# Patient Record
Sex: Female | Born: 1950 | Race: White | Hispanic: No | Smoking: Former smoker
Health system: Southern US, Community
[De-identification: ages and names within clinical notes are randomized; demographics above are authoritative.]

## PROBLEM LIST (undated history)

## (undated) ENCOUNTER — Emergency Department (HOSPITAL_BASED_OUTPATIENT_CLINIC_OR_DEPARTMENT_OTHER): Admission: EM | Payer: Commercial Managed Care - HMO

## (undated) DIAGNOSIS — F319 Bipolar disorder, unspecified: Secondary | ICD-10-CM

## (undated) DIAGNOSIS — S92912A Unspecified fracture of left toe(s), initial encounter for closed fracture: Secondary | ICD-10-CM

## (undated) DIAGNOSIS — Z9109 Other allergy status, other than to drugs and biological substances: Secondary | ICD-10-CM

## (undated) DIAGNOSIS — N200 Calculus of kidney: Secondary | ICD-10-CM

## (undated) DIAGNOSIS — F32A Depression, unspecified: Secondary | ICD-10-CM

## (undated) DIAGNOSIS — F419 Anxiety disorder, unspecified: Secondary | ICD-10-CM

## (undated) DIAGNOSIS — B029 Zoster without complications: Secondary | ICD-10-CM

## (undated) DIAGNOSIS — M6289 Other specified disorders of muscle: Secondary | ICD-10-CM

## (undated) DIAGNOSIS — M79672 Pain in left foot: Secondary | ICD-10-CM

## (undated) DIAGNOSIS — M26609 Unspecified temporomandibular joint disorder, unspecified side: Secondary | ICD-10-CM

## (undated) DIAGNOSIS — R3912 Poor urinary stream: Secondary | ICD-10-CM

## (undated) DIAGNOSIS — Z86010 Personal history of colonic polyps: Secondary | ICD-10-CM

## (undated) DIAGNOSIS — E559 Vitamin D deficiency, unspecified: Secondary | ICD-10-CM

## (undated) DIAGNOSIS — I10 Essential (primary) hypertension: Secondary | ICD-10-CM

## (undated) DIAGNOSIS — I255 Ischemic cardiomyopathy: Secondary | ICD-10-CM

## (undated) DIAGNOSIS — Z85828 Personal history of other malignant neoplasm of skin: Secondary | ICD-10-CM

## (undated) DIAGNOSIS — K449 Diaphragmatic hernia without obstruction or gangrene: Secondary | ICD-10-CM

## (undated) DIAGNOSIS — I201 Angina pectoris with documented spasm: Secondary | ICD-10-CM

## (undated) DIAGNOSIS — N719 Inflammatory disease of uterus, unspecified: Secondary | ICD-10-CM

## (undated) DIAGNOSIS — K8020 Calculus of gallbladder without cholecystitis without obstruction: Secondary | ICD-10-CM

## (undated) DIAGNOSIS — D649 Anemia, unspecified: Secondary | ICD-10-CM

## (undated) DIAGNOSIS — Z860101 Personal history of adenomatous and serrated colon polyps: Secondary | ICD-10-CM

## (undated) DIAGNOSIS — C801 Malignant (primary) neoplasm, unspecified: Secondary | ICD-10-CM

## (undated) DIAGNOSIS — R7303 Prediabetes: Secondary | ICD-10-CM

## (undated) DIAGNOSIS — S139XXA Sprain of joints and ligaments of unspecified parts of neck, initial encounter: Secondary | ICD-10-CM

## (undated) DIAGNOSIS — J449 Chronic obstructive pulmonary disease, unspecified: Secondary | ICD-10-CM

## (undated) DIAGNOSIS — I214 Non-ST elevation (NSTEMI) myocardial infarction: Secondary | ICD-10-CM

## (undated) DIAGNOSIS — M47812 Spondylosis without myelopathy or radiculopathy, cervical region: Secondary | ICD-10-CM

## (undated) DIAGNOSIS — J189 Pneumonia, unspecified organism: Secondary | ICD-10-CM

## (undated) DIAGNOSIS — M797 Fibromyalgia: Secondary | ICD-10-CM

## (undated) DIAGNOSIS — Z87442 Personal history of urinary calculi: Secondary | ICD-10-CM

## (undated) DIAGNOSIS — I639 Cerebral infarction, unspecified: Secondary | ICD-10-CM

## (undated) DIAGNOSIS — N183 Chronic kidney disease, stage 3 unspecified: Secondary | ICD-10-CM

## (undated) DIAGNOSIS — E871 Hypo-osmolality and hyponatremia: Secondary | ICD-10-CM

## (undated) DIAGNOSIS — A419 Sepsis, unspecified organism: Secondary | ICD-10-CM

## (undated) DIAGNOSIS — N950 Postmenopausal bleeding: Secondary | ICD-10-CM

## (undated) DIAGNOSIS — G3184 Mild cognitive impairment, so stated: Secondary | ICD-10-CM

## (undated) DIAGNOSIS — Z9889 Other specified postprocedural states: Secondary | ICD-10-CM

## (undated) DIAGNOSIS — R0602 Shortness of breath: Secondary | ICD-10-CM

## (undated) DIAGNOSIS — Z8619 Personal history of other infectious and parasitic diseases: Secondary | ICD-10-CM

## (undated) DIAGNOSIS — S72009A Fracture of unspecified part of neck of unspecified femur, initial encounter for closed fracture: Secondary | ICD-10-CM

## (undated) DIAGNOSIS — F1021 Alcohol dependence, in remission: Secondary | ICD-10-CM

## (undated) DIAGNOSIS — M79606 Pain in leg, unspecified: Secondary | ICD-10-CM

## (undated) DIAGNOSIS — K5909 Other constipation: Secondary | ICD-10-CM

## (undated) DIAGNOSIS — D126 Benign neoplasm of colon, unspecified: Secondary | ICD-10-CM

## (undated) DIAGNOSIS — R413 Other amnesia: Secondary | ICD-10-CM

## (undated) DIAGNOSIS — K219 Gastro-esophageal reflux disease without esophagitis: Secondary | ICD-10-CM

## (undated) DIAGNOSIS — M810 Age-related osteoporosis without current pathological fracture: Secondary | ICD-10-CM

## (undated) DIAGNOSIS — K648 Other hemorrhoids: Secondary | ICD-10-CM

## (undated) DIAGNOSIS — E785 Hyperlipidemia, unspecified: Secondary | ICD-10-CM

## (undated) DIAGNOSIS — M19032 Primary osteoarthritis, left wrist: Secondary | ICD-10-CM

## (undated) DIAGNOSIS — Z8673 Personal history of transient ischemic attack (TIA), and cerebral infarction without residual deficits: Secondary | ICD-10-CM

## (undated) HISTORY — DX: Other hemorrhoids: K64.8

## (undated) HISTORY — DX: Other specified disorders of muscle: M62.89

## (undated) HISTORY — DX: Unspecified fracture of left toe(s), initial encounter for closed fracture: S92.912A

## (undated) HISTORY — DX: Diaphragmatic hernia without obstruction or gangrene: K44.9

## (undated) HISTORY — PX: BACK SURGERY: SHX140

## (undated) HISTORY — DX: Angina pectoris with documented spasm: I20.1

## (undated) HISTORY — DX: Personal history of other infectious and parasitic diseases: Z86.19

## (undated) HISTORY — PX: COLONOSCOPY W/ POLYPECTOMY: SHX1380

## (undated) HISTORY — DX: Hypo-osmolality and hyponatremia: E87.1

## (undated) HISTORY — DX: Age-related osteoporosis without current pathological fracture: M81.0

## (undated) HISTORY — DX: Zoster without complications: B02.9

## (undated) HISTORY — DX: Primary osteoarthritis, left wrist: M19.032

## (undated) HISTORY — DX: Vitamin D deficiency, unspecified: E55.9

## (undated) HISTORY — PX: ESOPHAGOGASTRODUODENOSCOPY: SHX1529

## (undated) HISTORY — DX: Pain in left foot: M79.672

## (undated) HISTORY — DX: Chronic kidney disease, stage 3 unspecified: N18.30

## (undated) HISTORY — DX: Sprain of joints and ligaments of unspecified parts of neck, initial encounter: S13.9XXA

## (undated) HISTORY — DX: Pain in leg, unspecified: M79.606

## (undated) HISTORY — DX: Fracture of unspecified part of neck of unspecified femur, initial encounter for closed fracture: S72.009A

## (undated) HISTORY — DX: Chronic obstructive pulmonary disease, unspecified: J44.9

## (undated) HISTORY — DX: Spondylosis without myelopathy or radiculopathy, cervical region: M47.812

## (undated) HISTORY — DX: Hyperlipidemia, unspecified: E78.5

## (undated) HISTORY — PX: TRANSTHORACIC ECHOCARDIOGRAM: SHX275

## (undated) HISTORY — DX: Mild cognitive impairment of uncertain or unknown etiology: G31.84

## (undated) HISTORY — DX: Inflammatory disease of uterus, unspecified: N71.9

## (undated) HISTORY — DX: Prediabetes: R73.03

## (undated) HISTORY — DX: Calculus of gallbladder without cholecystitis without obstruction: K80.20

## (undated) HISTORY — DX: Ischemic cardiomyopathy: I25.5

## (undated) HISTORY — DX: Sepsis, unspecified organism: A41.9

## (undated) HISTORY — PX: JOINT REPLACEMENT: SHX530

## (undated) HISTORY — DX: Calculus of kidney: N20.0

## (undated) HISTORY — DX: Postmenopausal bleeding: N95.0

## (undated) HISTORY — DX: Non-ST elevation (NSTEMI) myocardial infarction: I21.4

## (undated) HISTORY — DX: Other amnesia: R41.3

## (undated) HISTORY — DX: Benign neoplasm of colon, unspecified: D12.6

## (undated) HISTORY — DX: Other constipation: K59.09

## (undated) HISTORY — DX: Poor urinary stream: R39.12

## (undated) HISTORY — DX: Fibromyalgia: M79.7

## (undated) HISTORY — DX: Alcohol dependence, in remission: F10.21

## (undated) HISTORY — PX: BUNIONECTOMY: SHX129

---

## 1973-04-04 HISTORY — PX: TONSILLECTOMY AND ADENOIDECTOMY: SUR1326

## 1999-07-23 ENCOUNTER — Ambulatory Visit (HOSPITAL_COMMUNITY): Admission: RE | Admit: 1999-07-23 | Discharge: 1999-07-23 | Payer: Self-pay | Admitting: Neurosurgery

## 1999-07-23 ENCOUNTER — Encounter: Payer: Self-pay | Admitting: Neurosurgery

## 1999-08-27 ENCOUNTER — Encounter: Payer: Self-pay | Admitting: Neurosurgery

## 1999-08-27 ENCOUNTER — Ambulatory Visit (HOSPITAL_COMMUNITY): Admission: RE | Admit: 1999-08-27 | Discharge: 1999-08-28 | Payer: Self-pay | Admitting: Neurosurgery

## 1999-08-27 HISTORY — PX: CERVICAL SPINE SURGERY: SHX589

## 2000-04-04 HISTORY — PX: CERVICAL FUSION: SHX112

## 2001-04-04 HISTORY — PX: TOTAL KNEE ARTHROPLASTY: SHX125

## 2002-08-06 ENCOUNTER — Encounter: Admission: RE | Admit: 2002-08-06 | Discharge: 2002-08-06 | Payer: Self-pay

## 2002-08-26 ENCOUNTER — Ambulatory Visit: Admission: RE | Admit: 2002-08-26 | Discharge: 2002-08-26 | Payer: Self-pay

## 2002-09-18 ENCOUNTER — Encounter: Admission: RE | Admit: 2002-09-18 | Discharge: 2002-09-18 | Payer: Self-pay

## 2002-12-27 ENCOUNTER — Emergency Department (HOSPITAL_COMMUNITY): Admission: EM | Admit: 2002-12-27 | Discharge: 2002-12-27 | Payer: Self-pay | Admitting: *Deleted

## 2003-10-21 ENCOUNTER — Emergency Department (HOSPITAL_COMMUNITY): Admission: EM | Admit: 2003-10-21 | Discharge: 2003-10-21 | Payer: Self-pay | Admitting: Emergency Medicine

## 2004-01-29 ENCOUNTER — Ambulatory Visit (HOSPITAL_COMMUNITY): Admission: RE | Admit: 2004-01-29 | Discharge: 2004-01-29 | Payer: Self-pay | Admitting: Gastroenterology

## 2005-03-16 ENCOUNTER — Other Ambulatory Visit: Admission: RE | Admit: 2005-03-16 | Discharge: 2005-03-16 | Payer: Self-pay | Admitting: Family Medicine

## 2005-03-31 ENCOUNTER — Encounter: Admission: RE | Admit: 2005-03-31 | Discharge: 2005-03-31 | Payer: Self-pay | Admitting: Family Medicine

## 2006-03-01 ENCOUNTER — Encounter: Admission: RE | Admit: 2006-03-01 | Discharge: 2006-03-01 | Payer: Self-pay | Admitting: Family Medicine

## 2007-03-26 ENCOUNTER — Ambulatory Visit (HOSPITAL_BASED_OUTPATIENT_CLINIC_OR_DEPARTMENT_OTHER): Admission: RE | Admit: 2007-03-26 | Discharge: 2007-03-26 | Payer: Self-pay | Admitting: Family Medicine

## 2007-04-01 ENCOUNTER — Ambulatory Visit: Payer: Self-pay | Admitting: Internal Medicine

## 2007-04-01 HISTORY — PX: OTHER SURGICAL HISTORY: SHX169

## 2008-02-27 ENCOUNTER — Encounter: Admission: RE | Admit: 2008-02-27 | Discharge: 2008-02-27 | Payer: Self-pay | Admitting: Family Medicine

## 2008-03-04 ENCOUNTER — Emergency Department (HOSPITAL_COMMUNITY): Admission: EM | Admit: 2008-03-04 | Discharge: 2008-03-04 | Payer: Self-pay | Admitting: Emergency Medicine

## 2008-04-04 DIAGNOSIS — Z8619 Personal history of other infectious and parasitic diseases: Secondary | ICD-10-CM

## 2008-04-04 HISTORY — DX: Personal history of other infectious and parasitic diseases: Z86.19

## 2008-04-18 ENCOUNTER — Encounter: Admission: RE | Admit: 2008-04-18 | Discharge: 2008-04-18 | Payer: Self-pay | Admitting: Gastroenterology

## 2008-04-24 ENCOUNTER — Other Ambulatory Visit: Admission: RE | Admit: 2008-04-24 | Discharge: 2008-04-24 | Payer: Self-pay | Admitting: Gastroenterology

## 2008-05-12 ENCOUNTER — Encounter: Admission: RE | Admit: 2008-05-12 | Discharge: 2008-05-12 | Payer: Self-pay | Admitting: Family Medicine

## 2008-07-10 ENCOUNTER — Encounter: Admission: RE | Admit: 2008-07-10 | Discharge: 2008-07-10 | Payer: Self-pay | Admitting: Neurosurgery

## 2009-03-18 ENCOUNTER — Encounter: Admission: RE | Admit: 2009-03-18 | Discharge: 2009-03-18 | Payer: Self-pay | Admitting: Neurology

## 2009-06-30 ENCOUNTER — Ambulatory Visit (HOSPITAL_COMMUNITY): Admission: RE | Admit: 2009-06-30 | Discharge: 2009-07-01 | Payer: Self-pay | Admitting: Neurosurgery

## 2009-06-30 HISTORY — PX: ANTERIOR CERVICAL DECOMP/DISCECTOMY FUSION: SHX1161

## 2010-03-25 ENCOUNTER — Other Ambulatory Visit
Admission: RE | Admit: 2010-03-25 | Discharge: 2010-03-25 | Payer: Self-pay | Source: Home / Self Care | Admitting: Family Medicine

## 2010-05-05 DIAGNOSIS — M810 Age-related osteoporosis without current pathological fracture: Secondary | ICD-10-CM

## 2010-05-05 HISTORY — DX: Age-related osteoporosis without current pathological fracture: M81.0

## 2010-06-25 LAB — CBC
HCT: 37.1 % (ref 36.0–46.0)
Platelets: 256 10*3/uL (ref 150–400)
RDW: 12.6 % (ref 11.5–15.5)

## 2010-06-25 LAB — PROTIME-INR: INR: 1 (ref 0.00–1.49)

## 2010-06-25 LAB — BASIC METABOLIC PANEL
BUN: 7 mg/dL (ref 6–23)
Calcium: 9.5 mg/dL (ref 8.4–10.5)
GFR calc non Af Amer: >60 mL/min (ref >60)
Glucose, Bld: 90 mg/dL (ref 70–99)

## 2010-06-25 LAB — APTT: aPTT: 32 seconds (ref 24–37)

## 2010-08-17 NOTE — Procedures (Signed)
NAME:  Erin Lopez, Erin Lopez            ACCOUNT NO.:  [id number]   MEDICAL RECORD NO.:  [id number]          PATIENT TYPE:  OUT   LOCATION:  SLEEP CENTER                 FACILITY:  [facility]   PHYSICIAN:  Clinton D. Maple Hudson, MD, FCCP, FACPDATE OF BIRTH:  May 07[birth year] 1952   DATE OF STUDY:  03/26/2007                            NOCTURNAL POLYSOMNOGRAM   REFERRING PHYSICIAN:  Talmadge Coventry, M.D.   INDICATION FOR STUDY:  Insomnia with sleep apnea.  BMI 25, weight 160  pounds, height 66 inches, neck 13.5 inches.   EPWORTH SLEEPINESS SCORE:  11/24   MEDICATIONS:  Home medication charted and reviewed.   SLEEP ARCHITECTURE:  Total sleep time 329 minutes with sleep efficiency  82%.  Stage I was 12%, stage II 64%, stage III absent, REM 23% of total  sleep time.  Sleep latency 19 minutes, REM latency 171 minutes.  Awake  after sleep onset 47 minutes.  Arousal index is 1.3.  Bedtime medication  included Lexapro, Ambien, and Flexeril.   RESPIRATORY DATA:  Apnea/hypopnea index (AHI) 0.2 obstructive events per  hour which is normal (normal range 0-5 per hour).  This reflected a  single obstructive respiratory event, a hypopnea recorded while sleeping  supine.   OXYGEN DATA:  Mild snoring with oxygen desaturation to a nadir of 89%.  Mean oxygen saturation through the study was 91.6% on room air.   CARDIAC DATA:  Normal sinus rhythm.   MOVEMENT-PARASOMNIA:  No significant movement disturbance.  Bathroom x1.   IMPRESSIONS-RECOMMENDATIONS:  1. Unremarkable sleep architecture for sleep center environment,      recognizing use of sedating medications, Ambien and Flexeril, at      bedtime.  2. No significant respiratory disturbance, apnea/hypopnea index 0.2      per hour (normal range 0-5 per hour).  Mild snoring with oxygen      desaturation to a nadir of 89%.      Clinton D. Maple Hudson, MD, [facility], FACP  Diplomate, Biomedical engineer of Sleep Medicine  Electronically Signed     CDY/MEDQ  D:  04/01/2007  11:56:24  T:  04/01/2007 16:10:96  Job:  [id number]

## 2010-08-20 NOTE — Op Note (Signed)
[place]  Patient:    Erin Lopez, Erin Lopez                   MRN: [id number] Proc. Date: 08/27/99 Adm. Date:  [id number] Disc. Date: [id number] Attending:  Gerald Dexter                           Operative Report  PREOPERATIVE DIAGNOSIS:  Spondylosis, C6-7, C7-T1, left.  POSTOPERATIVE DIAGNOSIS:  PROCEDURE:  Left C6-7, C7-T1 foraminotomies with left C7 hemilaminectomy. Microdissection of C7 and T1 nerve roots.  SURGEON:  Reinaldo Meeker, M.D.  ASSISTANT:  Julio Sicks, M.D.  PROCEDURE IN DETAIL:  After having been placed in three-point pin fixation and in the prone position, patients neck was prepped and draped in the usual sterile fashion.  Localizing x-ray was taken prior to incision to identify the appropriate level.  A midline incision was made above the spinous processes of C6, C7 and T1.  Using Bovie cutting current, the incision was carried down to the spinous processes.  Subperiosteal dissection was then carried out on the left side of the spinous processes and laminae and a McCullough self-retaining retractor was placed for exposure.  A second x-ray was taken to confirm approach at the appropriate level and this was correct.  A high-speed drill was used to do a proximal foraminotomy and laminotomies laterally to allow for access to the ______  of the lamina of C7.  A complete left-sided hemilaminectomy of C7 was then carried out.  Foraminotomies were then carried out at C6-7 and C7-T1 on the left side.  Microscope was draped, brought into the field and used for the remainder of the case.  Using microdissection technique, the proximal C7 nerve root was identified.  Inspection was then carried out down to the C7-T1 level where the C8 nerve root was identified. Generous foraminotomy was carried out.  Along the lateral aspect of the spinal canal behind the lamina of C7, there was overgrowth of soft tissue and this was removed.  This  gave excellent decompression of the lateral spinal dura as well as the proximal C8 nerve root.  At this point, inspection was carried out in all directions for any evidence of residual compression and none could be identified.  Large amounts of irrigation were carried out and any bleeding controlled with bipolar coagulation and Gelfoam.  The wound was then closed using interrupted Vicryl in the muscle, fascia, subcutaneous and subcuticular tissues and staples on the skin.  A sterile dressing was then applied. Patient was extubated and taken to recovery room in stable condition.DD: 08/27/99 TD:  08/31/99 Job: [id number]

## 2010-08-20 NOTE — Op Note (Signed)
Erin Lopez, Erin Lopez            ACCOUNT NO.:  [id number]   MEDICAL RECORD NO.:  [id number]          PATIENT TYPE:  AMB   LOCATION:  ENDO                         FACILITY:  [facility]   PHYSICIAN:  Jordan Hawks. Elnoria Howard, MD    DATE OF BIRTH:  17-Apr-1950   DATE OF PROCEDURE:  01/29/2004  DATE OF DISCHARGE:                                 OPERATIVE REPORT   PROCEDURE PERFORMED:  Colonoscopy.   INDICATIONS FOR PROCEDURE:  Screening.   REFERRING PHYSICIAN:  Talmadge Coventry, M.D.   ENDOSCOPIST:  Jordan Hawks. Elnoria Howard, MD   INSTRUMENT USED:  Olympus adult colonoscope.   PHYSICAL EXAMINATION:  CARDIAC:  Regular rate and rhythm.  LUNGS:  Clear to auscultation bilaterally.  ABDOMEN:  Soft, nontender, nondistended.   MEDICATIONS GIVEN:  Versed 10 mg IV, Demerol 100 mg IV.   CONSENT:  Informed consent was obtained from the patient describing the  risks of bleeding, infection, perforation, medication reaction, a 10% miss  rate for a small colon cancer or polyp and the risk of death, all of which  are not exclusive of any other complications that can occur.   DESCRIPTION OF PROCEDURE:  After adequate sedation was achieved, a rectal  examination was performed which was negative for any abnormalities.  The  colonoscope was then inserted from the anus and advanced under direct  visualization to the cecum. The patient was noted to have an adequate prep;  however, several areas were noted to have vegetable particulate matter which  was difficult to aspirate.  However, the majority of the colon was able to  be evaluated in detail.  However, a small, i.e. less than 5 mm polyp could  possibly be missed in two or three of the areas that we were not able to be  complete aspirated and this was discussed with the patient.  The endoscope  was withdrawn slowly from the cecum and there was no evidence of any masses,  polyps, inflammation, ulcerations, erosions or vascular malformations in the  cecum, ascending,  transverse, descending and sigmoid colon.  The patient was  noted to have a few diverticula in the sigmoid colon.  Upon retroflexion in  the rectum, the patient was noted to  have external hemorrhoids.  The colonoscope was straightened and withdrawn.  No complications were encountered.  The patient tolerated the procedure  well.   PLAN:  Repeat colonoscopy in approximately 10 years.       PDH/MEDQ  D:  01/29/2004  T:  01/29/2004  Job:  [id number]   cc:   Talmadge Coventry, M.D.  [address]  [place]  [place] [address]  Fax: [phone]

## 2011-05-05 ENCOUNTER — Other Ambulatory Visit: Payer: Self-pay | Admitting: Family Medicine

## 2011-05-05 ENCOUNTER — Other Ambulatory Visit (HOSPITAL_COMMUNITY)
Admission: RE | Admit: 2011-05-05 | Discharge: 2011-05-05 | Disposition: A | Discharge status: Home / Self Care | Payer: Medicare Other | Source: Ambulatory Visit | Attending: Family Medicine | Admitting: Family Medicine

## 2011-05-05 DIAGNOSIS — Z124 Encounter for screening for malignant neoplasm of cervix: Secondary | ICD-10-CM | POA: Insufficient documentation

## 2011-05-06 DIAGNOSIS — E559 Vitamin D deficiency, unspecified: Secondary | ICD-10-CM

## 2011-05-06 HISTORY — DX: Vitamin D deficiency, unspecified: E55.9

## 2011-08-03 ENCOUNTER — Ambulatory Visit
Admission: RE | Admit: 2011-08-03 | Discharge: 2011-08-03 | Disposition: A | Discharge status: Home / Self Care | Payer: Medicare Other | Source: Ambulatory Visit | Attending: Family Medicine | Admitting: Family Medicine

## 2011-08-03 ENCOUNTER — Other Ambulatory Visit: Payer: Self-pay | Admitting: Family Medicine

## 2011-08-03 DIAGNOSIS — S60229A Contusion of unspecified hand, initial encounter: Secondary | ICD-10-CM

## 2012-08-23 ENCOUNTER — Other Ambulatory Visit: Payer: Self-pay

## 2012-08-23 ENCOUNTER — Ambulatory Visit
Admission: RE | Admit: 2012-08-23 | Discharge: 2012-08-23 | Disposition: A | Discharge status: Home / Self Care | Payer: Medicare Other | Source: Ambulatory Visit

## 2012-08-23 DIAGNOSIS — M549 Dorsalgia, unspecified: Secondary | ICD-10-CM

## 2013-05-15 ENCOUNTER — Encounter (HOSPITAL_COMMUNITY): Payer: Self-pay | Admitting: Emergency Medicine

## 2013-05-15 ENCOUNTER — Emergency Department (HOSPITAL_COMMUNITY): Payer: Medicare Other

## 2013-05-15 DIAGNOSIS — M549 Dorsalgia, unspecified: Secondary | ICD-10-CM | POA: Insufficient documentation

## 2013-05-15 DIAGNOSIS — F4489 Other dissociative and conversion disorders: Secondary | ICD-10-CM | POA: Insufficient documentation

## 2013-05-15 DIAGNOSIS — F411 Generalized anxiety disorder: Secondary | ICD-10-CM | POA: Insufficient documentation

## 2013-05-15 DIAGNOSIS — Z87891 Personal history of nicotine dependence: Secondary | ICD-10-CM | POA: Insufficient documentation

## 2013-05-15 DIAGNOSIS — R413 Other amnesia: Secondary | ICD-10-CM | POA: Insufficient documentation

## 2013-05-15 DIAGNOSIS — R41840 Attention and concentration deficit: Secondary | ICD-10-CM | POA: Insufficient documentation

## 2013-05-15 DIAGNOSIS — R51 Headache: Secondary | ICD-10-CM | POA: Insufficient documentation

## 2013-05-15 DIAGNOSIS — Z792 Long term (current) use of antibiotics: Secondary | ICD-10-CM | POA: Insufficient documentation

## 2013-05-15 DIAGNOSIS — G479 Sleep disorder, unspecified: Secondary | ICD-10-CM | POA: Insufficient documentation

## 2013-05-15 DIAGNOSIS — Z79899 Other long term (current) drug therapy: Secondary | ICD-10-CM | POA: Insufficient documentation

## 2013-05-15 DIAGNOSIS — R110 Nausea: Secondary | ICD-10-CM | POA: Insufficient documentation

## 2013-05-15 DIAGNOSIS — F319 Bipolar disorder, unspecified: Secondary | ICD-10-CM | POA: Insufficient documentation

## 2013-05-15 DIAGNOSIS — I10 Essential (primary) hypertension: Secondary | ICD-10-CM | POA: Insufficient documentation

## 2013-05-15 DIAGNOSIS — M62838 Other muscle spasm: Secondary | ICD-10-CM | POA: Insufficient documentation

## 2013-05-15 DIAGNOSIS — G8929 Other chronic pain: Secondary | ICD-10-CM | POA: Insufficient documentation

## 2013-05-15 LAB — BASIC METABOLIC PANEL
BUN: 18 mg/dL (ref 6–23)
CALCIUM: 9.3 mg/dL (ref 8.4–10.5)
CO2: 22 mEq/L (ref 19–32)
Chloride: 101 mEq/L (ref 96–112)
Creatinine, Ser: 0.73 mg/dL (ref 0.50–1.10)
GFR calc Af Amer: >90 mL/min (ref >90)
GLUCOSE: 118 mg/dL — AB (ref 70–99)
Potassium: 3.8 mEq/L (ref 3.7–5.3)
Sodium: 140 mEq/L (ref 137–147)

## 2013-05-15 LAB — CBC
HCT: 36 % (ref 36.0–46.0)
HEMOGLOBIN: 12.9 g/dL (ref 12.0–15.0)
MCH: 30.9 pg (ref 26.0–34.0)
MCHC: 35.8 g/dL (ref 30.0–36.0)
MCV: 86.3 fL (ref 78.0–100.0)
Platelets: 237 10*3/uL (ref 150–400)
RBC: 4.17 MIL/uL (ref 3.87–5.11)
RDW: 12.8 % (ref 11.5–15.5)
WBC: 6.9 10*3/uL (ref 4.0–10.5)

## 2013-05-15 LAB — POCT I-STAT TROPONIN I: TROPONIN I, POC: 0.01 ng/mL (ref 0.00–0.08)

## 2013-05-15 LAB — PRO B NATRIURETIC PEPTIDE: Pro B Natriuretic peptide (BNP): 70.3 pg/mL (ref 0–125)

## 2013-05-15 NOTE — ED Notes (Signed)
C/o intermittent chest pain x 3 weeks.  Pt denies chest pain at present but reports pain to bilateral shoulders, sob, and nausea.  Pt tearful on triage exam.

## 2013-05-16 ENCOUNTER — Emergency Department (HOSPITAL_COMMUNITY)
Admission: EM | Admit: 2013-05-16 | Discharge: 2013-05-16 | Disposition: A | Discharge status: Home / Self Care | Payer: Medicare Other | Attending: Emergency Medicine | Admitting: Emergency Medicine

## 2013-05-16 ENCOUNTER — Emergency Department (HOSPITAL_COMMUNITY): Payer: Medicare Other

## 2013-05-16 DIAGNOSIS — M542 Cervicalgia: Secondary | ICD-10-CM

## 2013-05-16 DIAGNOSIS — G8929 Other chronic pain: Secondary | ICD-10-CM

## 2013-05-16 DIAGNOSIS — I10 Essential (primary) hypertension: Secondary | ICD-10-CM

## 2013-05-16 DIAGNOSIS — R413 Other amnesia: Secondary | ICD-10-CM

## 2013-05-16 DIAGNOSIS — M62838 Other muscle spasm: Secondary | ICD-10-CM

## 2013-05-16 HISTORY — DX: Bipolar disorder, unspecified: F31.9

## 2013-05-16 HISTORY — DX: Anxiety disorder, unspecified: F41.9

## 2013-05-16 HISTORY — DX: Essential (primary) hypertension: I10

## 2013-05-16 LAB — HEPATIC FUNCTION PANEL
ALK PHOS: 62 U/L (ref 39–117)
ALT: 36 U/L — ABNORMAL HIGH (ref 0–35)
AST: 24 U/L (ref 0–37)
Albumin: 4.1 g/dL (ref 3.5–5.2)
Total Protein: 6.9 g/dL (ref 6.0–8.3)

## 2013-05-16 LAB — POCT I-STAT TROPONIN I: TROPONIN I, POC: 0 ng/mL (ref 0.00–0.08)

## 2013-05-16 LAB — LIPASE, BLOOD: Lipase: 40 U/L (ref 11–59)

## 2013-05-16 MED ORDER — METHOCARBAMOL 750 MG PO TABS: 750.0000 mg | ORAL_TABLET | Freq: Four times a day (QID) | ORAL | Status: DC

## 2013-05-16 MED ORDER — DIPHENHYDRAMINE HCL 50 MG/ML IJ SOLN
12.5000 mg | Freq: Once | INTRAMUSCULAR | Status: AC
  Administered 2013-05-16: 12.5 mg via INTRAVENOUS
  Filled 2013-05-16: qty 1

## 2013-05-16 MED ORDER — METHOCARBAMOL 500 MG PO TABS
1000.0000 mg | ORAL_TABLET | Freq: Once | ORAL | Status: AC
  Administered 2013-05-16: 1000 mg via ORAL
  Filled 2013-05-16: qty 2

## 2013-05-16 MED ORDER — CARVEDILOL 6.25 MG PO TABS: 12.5000 mg | ORAL_TABLET | Freq: Two times a day (BID) | ORAL | Status: DC

## 2013-05-16 MED ORDER — HYDROMORPHONE HCL PF 1 MG/ML IJ SOLN
1.0000 mg | Freq: Once | INTRAMUSCULAR | Status: AC
  Administered 2013-05-16: 1 mg via INTRAVENOUS
  Filled 2013-05-16: qty 1

## 2013-05-16 NOTE — ED Notes (Signed)
Pt ambulated to POD A room 8 with slow, steady gait.

## 2013-05-16 NOTE — ED Provider Notes (Signed)
CSN: [id number]     Arrival date & time 05/15/13  2008 History   First MD Initiated Contact with Patient 05/16/13 0120     Chief Complaint  Patient presents with  . Chest Pain     (Consider location/radiation/quality/duration/timing/severity/associated sxs/prior Treatment) HPI 63 year old female presents emergency department with multiple complaints.  She is overall poor historian.  She reports ongoing memory problems for the last 2-3 years, worse over the last year.  She has noted that her blood pressure has been elevated recently.  With the elevated blood pressure,  she reports headaches, and confusion.  Daughter reports elevated blood pressure seems to happen right before she takes her pain medication.  Patient has chronic pain from neck issues.  She has had 3 prior surgeries.  She reports the pain in her neck and shoulders has been worsening over the last 2-3 weeks.  She denies chest pain, which she told triage, reports it is more neck pain.  She occasionally has nausea.  No shortness of breath.  No focal weakness, numbness, loss of balance difficulties eating, drinking.  Occasionally, she reports that she has difficulties with word finding, but this has been ongoing for some time.  Patient was told after visit with her therapist earlier this week that she had elevated blood pressure and needed followup with her primary care Dr.  She reports she forgot to call her primary care Dr.  She presents tonight, as she had a mild headache tonight, and checked her blood pressure, and it was elevated.  She reports that she thinks she's been taking her Coreg as prescribed.  She has been on it for several years, and the dose has not changed. Past Medical History  Diagnosis Date  . Hypertension   . Bipolar 1 disorder   . Anxiety    Past Surgical History  Procedure Laterality Date  . Cervical spine surgery     No family history on file. History  Substance Use Topics  . Smoking status: Former Research scientist (life sciences)  .  Smokeless tobacco: Not on file  . Alcohol Use: No   OB History   Grav Para Term Preterm Abortions TAB SAB Ect Mult Living                 Review of Systems  Constitutional: Negative.   Eyes: Negative.   Respiratory: Negative.   Cardiovascular: Negative.   Gastrointestinal: Positive for nausea.  Endocrine: Negative.   Genitourinary: Negative.   Musculoskeletal: Positive for arthralgias, back pain, myalgias, neck pain and neck stiffness.  Skin: Negative.   Allergic/Immunologic: Negative.   Neurological: Positive for headaches.  Hematological: Negative.   Psychiatric/Behavioral: Positive for confusion, sleep disturbance and decreased concentration.      Allergies  Ceftin  Home Medications   Current Outpatient Rx  Name  Route  Sig  Dispense  Refill  . doxycycline (ADOXA) 50 MG tablet   Oral   Take 50 mg by mouth 2 (two) times daily. Take everyday per patient         . lamoTRIgine (LAMICTAL) 100 MG tablet   Oral   Take 100 mg by mouth 3 (three) times daily.         . montelukast (SINGULAIR) 10 MG tablet   Oral   Take 10 mg by mouth daily.         Marland Kitchen oxyCODONE (ROXICODONE) 15 MG immediate release tablet   Oral   Take 15 mg by mouth 3 (three) times daily.         Marland Kitchen  venlafaxine XR (EFFEXOR-XR) 150 MG 24 hr capsule   Oral   Take 150 mg by mouth daily with breakfast.         . zolpidem (AMBIEN) 10 MG tablet   Oral   Take 10 mg by mouth at bedtime.         . carvedilol (COREG) 6.25 MG tablet   Oral   Take 2 tablets (12.5 mg total) by mouth 2 (two) times daily with a meal.   30 tablet   0   . methocarbamol (ROBAXIN-750) 750 MG tablet   Oral   Take 1 tablet (750 mg total) by mouth 4 (four) times daily.   40 tablet   0    BP 149/82  Pulse 70  Temp(Src) 98.3 F (36.8 C) (Oral)  Resp 22  Ht 5\' 7"  (1.702 m)  Wt 167 lb (75.751 kg)  BMI 26.15 kg/m2  SpO2 94% Physical Exam  Nursing note and vitals reviewed. Constitutional: She is oriented to  person, place, and time. She appears well-developed and well-nourished.  HENT:  Head: Normocephalic and atraumatic.  Right Ear: External ear normal.  Left Ear: External ear normal.  Nose: Nose normal.  Mouth/Throat: Oropharynx is clear and moist.  Eyes: Conjunctivae and EOM are normal. Pupils are equal, round, and reactive to light.  Neck: Normal range of motion. Neck supple. No JVD present. No tracheal deviation present. No thyromegaly present.  Cardiovascular: Normal rate, regular rhythm, normal heart sounds and intact distal pulses.  Exam reveals no gallop and no friction rub.   No murmur heard. Pulmonary/Chest: Effort normal and breath sounds normal. No stridor. No respiratory distress. She has no wheezes. She has no rales. She exhibits no tenderness.  Abdominal: Soft. Bowel sounds are normal. She exhibits no distension and no mass. There is no tenderness. There is no rebound and no guarding.  Musculoskeletal: Normal range of motion. She exhibits no edema and no tenderness.  Lymphadenopathy:    She has no cervical adenopathy.  Neurological: She is alert and oriented to person, place, and time. She has normal reflexes. No cranial nerve deficit. She exhibits normal muscle tone. Coordination normal.  Skin: Skin is warm and dry. No rash noted. No erythema. No pallor.  Psychiatric: She has a normal mood and affect. Her behavior is normal. Thought content normal.  Poor memory, insight and judgement    ED Course  Procedures (including critical care time) Labs Review Labs Reviewed  BASIC METABOLIC PANEL - Abnormal; Notable for the following:    Glucose, Bld 118 (*)    All other components within normal limits  HEPATIC FUNCTION PANEL - Abnormal; Notable for the following:    ALT 36 (*)    Total Bilirubin <0.2 (*)    All other components within normal limits  CBC  PRO B NATRIURETIC PEPTIDE  LIPASE, BLOOD  POCT I-STAT TROPONIN I  POCT I-STAT TROPONIN I   Imaging Review Dg Chest 2  View  05/15/2013   CLINICAL DATA:  Chest pain shortness of breath  EXAM: CHEST  2 VIEW  COMPARISON:  Chest radiograph 08/23/2012 and CT abdomen pelvis 03/07/2006  FINDINGS: The heart size appears within normal limits. The thoracic aortic contours within normal limits. There is mild pulmonary vascular congestion. No airspace opacities, effusions, or edema. There are postsurgical changes of the cervical spine, and surgical clips are seen to the right of midline at the thoracic inlet. No acute osseous abnormality. An oval 2.2 cm high density structure projects over the  left upper quadrant, and is not seen on prior studies.  IMPRESSION: 1. Mild pulmonary vascular congestion. Otherwise, the lungs are clear in the heart size is normal. 2. 2 cm oval high density structure in the left upper quadrant this of uncertain etiology. This was not present on prior studies.   Electronically Signed   By: Curlene Dolphin M.D.   On: 05/15/2013 21:34   Ct Head Wo Contrast  05/16/2013   CLINICAL DATA:  Confusion.  EXAM: CT HEAD WITHOUT CONTRAST  TECHNIQUE: Contiguous axial images were obtained from the base of the skull through the vertex without intravenous contrast.  COMPARISON:  None.  FINDINGS: No mass lesion, mass effect, midline shift, hydrocephalus, hemorrhage. No acute territorial cortical ischemia/infarct. Atrophy and chronic ischemic white matter disease is present. Subtle asymmetry is present in the cerebellar white matter which is favored be due to beam hardening artifact through the temporal bones rather than a cerebellar infarct. Intracranial atherosclerosis is present.  IMPRESSION: Atrophy and chronic ischemic white matter disease without acute intracranial abnormality.   Electronically Signed   By: Dereck Ligas M.D.   On: 05/16/2013 02:06    EKG Interpretation    Date/Time:  Wednesday May 15 2013 20:15:40 EST Ventricular Rate:  83 PR Interval:  168 QRS Duration: 84 QT Interval:  382 QTC  Calculation: 448 R Axis:   63 Text Interpretation:  Normal sinus rhythm Normal ECG Confirmed by Deiondre Harrower  MD, Alann Avey ([id number]) on 05/16/2013 4:00:22 AM            MDM   Final diagnoses:  Hypertension  Chronic neck pain  Memory difficulties  Muscle spasms of neck    63 yo female with multiple complaints, most chronic in nature.  She is hypertensive, but this improved with pain control, reports last oxycodone at 3 pm.  Advised patient to f/u closely with her pcm.  Will increase her coreg.  Pt also advised to keep a journal of symptoms, medications.  No signs of ACS, stroke, htn urgency, or other life threatening conditions on her workup today    Kalman Drape, MD 05/16/13 [phone]

## 2013-05-16 NOTE — ED Notes (Signed)
Dr. Sharol Given in to see patient.

## 2013-05-16 NOTE — ED Notes (Signed)
Pt A&Ox4, ambulatory at discharge, verbalizing no complaints at this time. 

## 2013-05-16 NOTE — Discharge Instructions (Signed)
Stop taking flexeril and switch to robaxin.  Increase your blood pressure medications as instructed.  Call your doctor in the morning for close follow up.  Keep a journal of when you take your medications and of your symptoms to help your providers.   Chronic Pain Chronic pain can be defined as pain that is off and on and lasts for 3 6 months or longer. Many things cause chronic pain, which can make it difficult to make a diagnosis. There are many treatment options available for chronic pain. However, finding a treatment that works well for you may require trying various approaches until the right one is found. Many people benefit from a combination of two or more types of treatment to control their pain. SYMPTOMS  Chronic pain can occur anywhere in the body and can range from mild to very severe. Some types of chronic pain include:  Headache.  Low back pain.  Cancer pain.  Arthritis pain.  Neurogenic pain. This is pain resulting from damage to nerves. People with chronic pain may also have other symptoms such as:  Depression.  Anger.  Insomnia.  Anxiety. DIAGNOSIS  Your health care provider will help diagnose your condition over time. In many cases, the initial focus will be on excluding possible conditions that could be causing the pain. Depending on your symptoms, your health care provider may order tests to diagnose your condition. Some of these tests may include:   Blood tests.   CT scan.   MRI.   X-rays.   Ultrasounds.   Nerve conduction studies.  You may need to see a specialist.  TREATMENT  Finding treatment that works well may take time. You may be referred to a pain specialist. He or she may prescribe medicine or therapies, such as:   Mindful meditation or yoga.  Shots (injections) of numbing or pain-relieving medicines into the spine or area of pain.  Local electrical stimulation.  Acupuncture.   Massage therapy.   Aroma, color, light, or  sound therapy.   Biofeedback.   Working with a physical therapist to keep from getting stiff.   Regular, gentle exercise.   Cognitive or behavioral therapy.   Group support.  Sometimes, surgery may be recommended.  HOME CARE INSTRUCTIONS   Take all medicines as directed by your health care provider.   Lessen stress in your life by relaxing and doing things such as listening to calming music.   Exercise or be active as directed by your health care provider.   Eat a healthy diet and include things such as vegetables, fruits, fish, and lean meats in your diet.   Keep all follow-up appointments with your health care provider.   Attend a support group with others suffering from chronic pain. SEEK MEDICAL CARE IF:   Your pain gets worse.   You develop a new pain that was not there before.   You cannot tolerate medicines given to you by your health care provider.   You have new symptoms since your last visit with your health care provider.  SEEK IMMEDIATE MEDICAL CARE IF:   You feel weak.   You have decreased sensation or numbness.   You lose control of bowel or bladder function.   Your pain suddenly gets much worse.   You develop shaking.  You develop chills.  You develop confusion.  You develop chest pain.  You develop shortness of breath.  MAKE SURE YOU:  Understand these instructions.  Will watch your condition.  Will get help  right away if you are not doing well or get worse. Document Released: 12/11/2001 Document Revised: 11/21/2012 Document Reviewed: 09/14/2012 [facility] Patient Information 2014 [place].  DASH Diet The DASH diet stands for "Dietary Approaches to Stop Hypertension." It is a healthy eating plan that has been shown to reduce high blood pressure (hypertension) in as little as 14 days, while also possibly providing other significant health benefits. These other health benefits include reducing the risk of breast  cancer after menopause and reducing the risk of type 2 diabetes, heart disease, colon cancer, and stroke. Health benefits also include weight loss and slowing kidney failure in patients with chronic kidney disease.  DIET GUIDELINES  Limit salt (sodium). Your diet should contain less than 1500 mg of sodium daily.  Limit refined or processed carbohydrates. Your diet should include mostly whole grains. Desserts and added sugars should be used sparingly.  Include small amounts of heart-healthy fats. These types of fats include nuts, oils, and tub margarine. Limit saturated and trans fats. These fats have been shown to be harmful in the body. CHOOSING FOODS  The following food groups are based on a 2000 calorie diet. See your Registered Dietitian for individual calorie needs. Grains and Grain Products (6 to 8 servings daily)  Eat More Often: Whole-wheat bread, brown rice, whole-grain or wheat pasta, quinoa, popcorn without added fat or salt (air popped).  Eat Less Often: White bread, white pasta, white rice, cornbread. Vegetables (4 to 5 servings daily)  Eat More Often: Fresh, frozen, and canned vegetables. Vegetables may be raw, steamed, roasted, or grilled with a minimal amount of fat.  Eat Less Often/Avoid: Creamed or fried vegetables. Vegetables in a cheese sauce. Fruit (4 to 5 servings daily)  Eat More Often: All fresh, canned (in natural juice), or frozen fruits. Dried fruits without added sugar. One hundred percent fruit juice ( cup [237 mL] daily).  Eat Less Often: Dried fruits with added sugar. Canned fruit in light or heavy syrup. [employer], Fish, and Poultry (2 servings or less daily. One serving is 3 to 4 oz [85-114 g]).  Eat More Often: Ninety percent or leaner ground beef, tenderloin, sirloin. Round cuts of beef, chicken breast, [place] breast. All fish. Grill, bake, or broil your meat. Nothing should be fried.  Eat Less Often/Avoid: Fatty cuts of meat, [place], or chicken leg,  thigh, or wing. Fried cuts of meat or fish. Dairy (2 to 3 servings)  Eat More Often: Low-fat or fat-free milk, low-fat plain or light yogurt, reduced-fat or part-skim cheese.  Eat Less Often/Avoid: Milk (whole, 2%).Whole milk yogurt. Full-fat cheeses. Nuts, Seeds, and Legumes (4 to 5 servings per week)  Eat More Often: All without added salt.  Eat Less Often/Avoid: Salted nuts and seeds, canned beans with added salt. Fats and Sweets (limited)  Eat More Often: Vegetable oils, tub margarines without trans fats, sugar-free gelatin. Mayonnaise and salad dressings.  Eat Less Often/Avoid: Coconut oils, palm oils, butter, stick margarine, cream, half and half, cookies, candy, pie. FOR MORE INFORMATION The Dash Diet Eating Plan: www.dashdiet.org Document Released: 03/10/2011 Document Revised: 06/13/2011 Document Reviewed: 03/10/2011 [facility] Patient Information 2014 [place], [place].  Hypertension As your heart beats, it forces blood through your arteries. This force is your blood pressure. If the pressure is too high, it is called hypertension (HTN) or high blood pressure. HTN is dangerous because you may have it and not know it. High blood pressure may mean that your heart has to work harder to pump blood.  Your arteries may be narrow or stiff. The extra work puts you at risk for heart disease, stroke, and other problems.  Blood pressure consists of two numbers, a higher number over a lower, 110/72, for example. It is stated as "110 over 72." The ideal is below 120 for the top number (systolic) and under 80 for the bottom (diastolic). Write down your blood pressure today. You should pay close attention to your blood pressure if you have certain conditions such as:  Heart failure.  Prior heart attack.  Diabetes  Chronic kidney disease.  Prior stroke.  Multiple risk factors for heart disease. To see if you have HTN, your blood pressure should be measured while you are seated with your arm  held at the level of the heart. It should be measured at least twice. A one-time elevated blood pressure reading (especially in the Emergency Department) does not mean that you need treatment. There may be conditions in which the blood pressure is different between your right and left arms. It is important to see your caregiver soon for a recheck. Most people have essential hypertension which means that there is not a specific cause. This type of high blood pressure may be lowered by changing lifestyle factors such as:  Stress.  Smoking.  Lack of exercise.  Excessive weight.  Drug/tobacco/alcohol use.  Eating less salt. Most people do not have symptoms from high blood pressure until it has caused damage to the body. Effective treatment can often prevent, delay or reduce that damage. TREATMENT  When a cause has been identified, treatment for high blood pressure is directed at the cause. There are a large number of medications to treat HTN. These fall into several categories, and your caregiver will help you select the medicines that are best for you. Medications may have side effects. You should review side effects with your caregiver. If your blood pressure stays high after you have made lifestyle changes or started on medicines,   Your medication(s) may need to be changed.  Other problems may need to be addressed.  Be certain you understand your prescriptions, and know how and when to take your medicine.  Be sure to follow up with your caregiver within the time frame advised (usually within two weeks) to have your blood pressure rechecked and to review your medications.  If you are taking more than one medicine to lower your blood pressure, make sure you know how and at what times they should be taken. Taking two medicines at the same time can result in blood pressure that is too low. SEEK IMMEDIATE MEDICAL CARE IF:  You develop a severe headache, blurred or changing vision, or  confusion.  You have unusual weakness or numbness, or a faint feeling.  You have severe chest or abdominal pain, vomiting, or breathing problems. MAKE SURE YOU:   Understand these instructions.  Will watch your condition.  Will get help right away if you are not doing well or get worse. Document Released: 03/21/2005 Document Revised: 06/13/2011 Document Reviewed: 11/09/2007 [facility] Patient Information 2014 [place].

## 2013-05-22 ENCOUNTER — Other Ambulatory Visit: Payer: Self-pay | Admitting: Family Medicine

## 2013-05-22 DIAGNOSIS — IMO0002 Reserved for concepts with insufficient information to code with codable children: Secondary | ICD-10-CM

## 2013-05-22 DIAGNOSIS — R229 Localized swelling, mass and lump, unspecified: Principal | ICD-10-CM

## 2013-05-28 ENCOUNTER — Ambulatory Visit: Payer: Medicare Other | Admitting: Family Medicine

## 2013-05-29 ENCOUNTER — Encounter: Payer: Self-pay | Admitting: Family Medicine

## 2013-05-29 ENCOUNTER — Ambulatory Visit (INDEPENDENT_AMBULATORY_CARE_PROVIDER_SITE_OTHER): Payer: Medicare Other | Admitting: Family Medicine

## 2013-05-29 VITALS — BP 165/94 | HR 76 | Temp 99.3°F | Resp 18 | Ht 66.5 in | Wt 165.0 lb

## 2013-05-29 DIAGNOSIS — R1902 Left upper quadrant abdominal swelling, mass and lump: Secondary | ICD-10-CM

## 2013-05-29 DIAGNOSIS — E785 Hyperlipidemia, unspecified: Secondary | ICD-10-CM

## 2013-05-29 DIAGNOSIS — I10 Essential (primary) hypertension: Secondary | ICD-10-CM | POA: Insufficient documentation

## 2013-05-29 DIAGNOSIS — N200 Calculus of kidney: Secondary | ICD-10-CM

## 2013-05-29 LAB — LIPID PANEL
CHOL/HDL RATIO: 4
Cholesterol: 226 mg/dL — ABNORMAL HIGH (ref 0–200)
HDL: 62.9 mg/dL (ref >39.00)
Triglycerides: 91 mg/dL (ref 0.0–149.0)
VLDL: 18.2 mg/dL (ref 0.0–40.0)

## 2013-05-29 LAB — URINALYSIS, ROUTINE W REFLEX MICROSCOPIC
Bilirubin Urine: NEGATIVE
Hgb urine dipstick: NEGATIVE
Ketones, ur: NEGATIVE
LEUKOCYTES UA: NEGATIVE
Nitrite: NEGATIVE
RBC / HPF: NONE SEEN (ref 0–?)
Specific Gravity, Urine: 1.01 (ref 1.000–1.030)
Total Protein, Urine: NEGATIVE
UROBILINOGEN UA: 0.2 (ref 0.0–1.0)
Urine Glucose: NEGATIVE
WBC UA: NONE SEEN (ref 0–?)
pH: 7 (ref 5.0–8.0)

## 2013-05-29 LAB — LDL CHOLESTEROL, DIRECT: Direct LDL: 144.1 mg/dL

## 2013-05-29 MED ORDER — ASPIRIN EC 81 MG PO TBEC: 81.0000 mg | DELAYED_RELEASE_TABLET | Freq: Every day | ORAL | Status: DC

## 2013-05-29 MED ORDER — LISINOPRIL 10 MG PO TABS: 10.0000 mg | ORAL_TABLET | Freq: Every day | ORAL | Status: DC

## 2013-05-29 NOTE — Progress Notes (Signed)
Pre visit review using our clinic review tool, if applicable. No additional management support is needed unless otherwise documented below in the visit note. 

## 2013-05-29 NOTE — Progress Notes (Signed)
Office Note 05/29/2013  CC:  Chief Complaint  Patient presents with  . Establish Care    Transfer from [place]    HPI:  Erin Lopez is a 63 y.o. White female who is here to establish care. Patient's most recent primary MD: Dr. Stephanie Acre at [facility] [place].  Pain MD is a rheumatologist in [place] named Dr. Minna Antis (sp?).  Sees a psychiatrist at [facility] in [place]. Old records in EPIC/HL EMR were reviewed prior to or during today's visit.  Reviewed history: basically has chronic pain "everywhere".  Normally controlled fairly well and is functional on current pain med regimen. ED visit for bp elevation assoc with worsening of chronic pain 05/16/13: CT head no acute but showed atrophy with chronic ischemic white matter disease.  Coreg was increased to 12.5 twice a day after that visit.  --reviewed all data from this visit today.  EKG normal.  CBC, CMET, lipase, TnI, BNP all normal.  No home bp monitoring but uses drug store monitor some--reports it had been [id number] systolic prior to going to the ED.  Unsure of what bp was at PMD f/u after ED visit.  Incidental LUQ 2 cm hyperdense area picked up on recent CXR at 05/16/13 ED visit as well;  pt says she has had this noted before.  She is very unclear on past w/u.  Poor historian, recall poor.  I cannot really tell if she recalls this or something else. I looked at x-ray today and this does not appear to be in the kidney or collecting system.   In review of orders, it appears that a CT abd with contrast has been ordered by Dr. Stephanie Acre to follow this up but it has not been scheduled yet.  Past Medical History  Diagnosis Date  . Hypertension   . Bipolar 1 disorder   . Anxiety   . Hay fever   . Diverticular disease of colon     + 'itis  . Nephrolithiasis     ? has never passed one to her knowledge.  ? hx of urology eval--pt memory poor.  . Hyperlipidemia     no meds in > a decade per pt  . COPD (chronic obstructive pulmonary disease)    . Adenomatous colon polyp 2010    1 adenomatous and multiple hyperplastic (recall 5 yrs)  . Fibromyalgia   . Alcoholism in remission     Past Surgical History  Procedure Laterality Date  . Cervical spine surgery       x 3 (in the 2000's)  . Total knee arthroplasty  2003  . Cesarean section  1994  . Tonsillectomy and adenoidectomy  1975  . Colonoscopy w/ polypectomy  2010    recall 5 yrs    No family history on file.  History   Social History  . Marital Status: Divorced    Spouse Name: N/A    Number of Children: N/A  . Years of Education: N/A   Occupational History  . Not on file.   Social History Main Topics  . Smoking status: Former Research scientist (life sciences)  . Smokeless tobacco: Not on file  . Alcohol Use: No  . Drug Use: No  . Sexual Activity: Not on file   Other Topics Concern  . Not on file   Social History Narrative   Widowed approx 2007.  Has one daughter age 26 who lives with her.   Lives in [place].   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  Disabled: chronic pain   HS education.   Tobacco 40 pack-yr hx, quit 1975.   Alcoholic, dry since 0000000.      Outpatient Encounter Prescriptions as of 05/29/2013  Medication Sig  . carvedilol (COREG) 6.25 MG tablet Take 2 tablets (12.5 mg total) by mouth 2 (two) times daily with a meal.  . doxycycline (ADOXA) 50 MG tablet Take 50 mg by mouth 2 (two) times daily. Take everyday per patient  . lamoTRIgine (LAMICTAL) 100 MG tablet Take 100 mg by mouth 3 (three) times daily.  . methocarbamol (ROBAXIN-750) 750 MG tablet Take 1 tablet (750 mg total) by mouth 4 (four) times daily.  . montelukast (SINGULAIR) 10 MG tablet Take 10 mg by mouth daily.  Marland Kitchen oxyCODONE (ROXICODONE) 15 MG immediate release tablet Take 15 mg by mouth 3 (three) times daily.  Marland Kitchen venlafaxine XR (EFFEXOR-XR) 150 MG 24 hr capsule Take 150 mg by mouth daily with breakfast.  . zolpidem (AMBIEN) 10 MG tablet Take 10 mg by mouth at bedtime.  Marland Kitchen aspirin  EC 81 MG tablet Take 1 tablet (81 mg total) by mouth daily.  Marland Kitchen lisinopril (PRINIVIL,ZESTRIL) 10 MG tablet Take 1 tablet (10 mg total) by mouth daily.  **Lisinopril not started until today's visit  Allergies  Allergen Reactions  . Ceftin [Cefuroxime Axetil]     unknown    ROS No HA, no vision changes, no focal weakness, no sob/wheeze, no joint swelling or rash. Mild left pelvic area pain x < 24h.  No n/v/d.  No abd pain, no hematuria.  No vag d/c or bleeding.  PE; Blood pressure 165/94, pulse 76, temperature 99.3 F (37.4 C), temperature source Temporal, resp. rate 18, height 5' 6.5" (1.689 m), weight 165 lb (74.844 kg), SpO2 94.00%. Repeat BP by cma, right arm: 142/90.  Gen: Alert, well appearing.  Patient is oriented to person, place, time, and situation. [id number]: no injection, icteris, swelling, or exudate.  EOMI, PERRLA. Mouth: lips without lesion/swelling.  Oral mucosa pink and moist. Oropharynx without erythema, exudate, or swelling.  Neck: supple/nontender.  No LAD, mass, or TM.  Carotid pulses 2+ bilaterally, without bruits. CV: RRR, no m/r/g.   LUNGS: CTA bilat, nonlabored resps, good aeration in all lung fields. ABD: soft, NT/ND, BS normal, no mass or bruit. EXT: no clubbing, cyanosis, or edema.   Pertinent labs:  Lab Results  Component Value Date   WBC 6.9 05/15/2013   HGB 12.9 05/15/2013   HCT 36.0 05/15/2013   MCV 86.3 05/15/2013   PLT 237 05/15/2013     Chemistry      Component Value Date/Time   NA 140 05/15/2013 2021   K 3.8 05/15/2013 2021   CL 101 05/15/2013 2021   CO2 22 05/15/2013 2021   BUN 18 05/15/2013 2021   CREATININE 0.73 05/15/2013 2021      Component Value Date/Time   CALCIUM 9.3 05/15/2013 2021   ALKPHOS 62 05/15/2013 2021   AST 24 05/15/2013 2021   ALT 36* 05/15/2013 2021   BILITOT <0.2* 05/15/2013 2021     Lab Results  Component Value Date   LIPASE 40 05/15/2013    ASSESSMENT AND PLAN:   New pt today; obtain old records.  Hypertension,  uncontrolled Continue coreg at 12.5mg  bid dosing. Add lisinopril 10mg  po qd today. Restart ASA 81mg  po qd. Monitoring of bp at home was discussed.  Abdominal mass, left upper quadrant Incidental finding on recent CXR. A CT abd with contrast has already been ordered and I  agree with pursuing this. Given its proximity to the superior aspect of the left kidney, will check UA with reflex micro today.  Hyperlipidemia Check cholesterol panel today (nonfasting).   An After Visit Summary was printed and given to the patient.  Return in about 4 weeks (around 06/26/2013) for f/u HTN and ? hx of L kidney stone.

## 2013-05-29 NOTE — Assessment & Plan Note (Signed)
Continue coreg at 12.5mg  bid dosing. Add lisinopril 10mg  po qd today. Restart ASA 81mg  po qd. Monitoring of bp at home was discussed.

## 2013-05-29 NOTE — Assessment & Plan Note (Signed)
Check cholesterol panel today (nonfasting).

## 2013-05-29 NOTE — Assessment & Plan Note (Signed)
Incidental finding on recent CXR. A CT abd with contrast has already been ordered and I agree with pursuing this. Given its proximity to the superior aspect of the left kidney, will check UA with reflex micro today.

## 2013-05-31 ENCOUNTER — Other Ambulatory Visit: Payer: Self-pay | Admitting: Family Medicine

## 2013-05-31 ENCOUNTER — Telehealth: Payer: Self-pay | Admitting: Family Medicine

## 2013-05-31 DIAGNOSIS — R1902 Left upper quadrant abdominal swelling, mass and lump: Secondary | ICD-10-CM

## 2013-05-31 NOTE — Telephone Encounter (Signed)
Relevant patient education assigned to patient using Emmi. ° °

## 2013-06-03 ENCOUNTER — Ambulatory Visit: Payer: Medicare Other | Admitting: Family Medicine

## 2013-06-04 ENCOUNTER — Ambulatory Visit: Payer: Medicare Other | Admitting: Family Medicine

## 2013-06-07 ENCOUNTER — Other Ambulatory Visit (HOSPITAL_COMMUNITY): Payer: Medicare Other

## 2013-06-13 ENCOUNTER — Encounter: Payer: Self-pay | Admitting: Family Medicine

## 2013-06-14 ENCOUNTER — Ambulatory Visit
Admission: RE | Admit: 2013-06-14 | Discharge: 2013-06-14 | Disposition: A | Discharge status: Home / Self Care | Payer: Medicare Other | Source: Ambulatory Visit | Attending: Family Medicine | Admitting: Family Medicine

## 2013-06-14 DIAGNOSIS — R1902 Left upper quadrant abdominal swelling, mass and lump: Secondary | ICD-10-CM

## 2013-06-14 MED ORDER — IOHEXOL 300 MG/ML  SOLN
100.0000 mL | Freq: Once | INTRAMUSCULAR | Status: AC | PRN
  Administered 2013-06-14: 100 mL via INTRAVENOUS

## 2013-06-17 ENCOUNTER — Other Ambulatory Visit: Payer: Self-pay | Admitting: Family Medicine

## 2013-06-17 DIAGNOSIS — N200 Calculus of kidney: Secondary | ICD-10-CM

## 2013-06-18 ENCOUNTER — Telehealth: Payer: Self-pay | Admitting: Family Medicine

## 2013-06-18 NOTE — Telephone Encounter (Signed)
Patient Erin Lopez wanting CT Scan results.   Please advise.

## 2013-06-19 NOTE — Telephone Encounter (Signed)
Pt has already been notified and I've referred her to urology for a large stone in left kidney. Does she not recall this conversation?

## 2013-06-19 NOTE — Telephone Encounter (Signed)
Patient states she was just wanting to know when [facility] will be calling her.   Can you please check on this.

## 2013-06-21 NOTE — Telephone Encounter (Signed)
[facility] is working on referral.  They are running a bit behind.

## 2013-06-24 ENCOUNTER — Encounter: Payer: Self-pay | Admitting: Family Medicine

## 2013-06-25 ENCOUNTER — Ambulatory Visit (INDEPENDENT_AMBULATORY_CARE_PROVIDER_SITE_OTHER): Payer: Medicare Other | Admitting: Family Medicine

## 2013-06-25 ENCOUNTER — Encounter: Payer: Self-pay | Admitting: Family Medicine

## 2013-06-25 VITALS — BP 136/89 | HR 88 | Temp 100.0°F | Resp 18 | Ht 66.5 in | Wt 168.0 lb

## 2013-06-25 DIAGNOSIS — R9389 Abnormal findings on diagnostic imaging of other specified body structures: Secondary | ICD-10-CM

## 2013-06-25 DIAGNOSIS — E785 Hyperlipidemia, unspecified: Secondary | ICD-10-CM

## 2013-06-25 DIAGNOSIS — N200 Calculus of kidney: Secondary | ICD-10-CM

## 2013-06-25 DIAGNOSIS — R918 Other nonspecific abnormal finding of lung field: Secondary | ICD-10-CM

## 2013-06-25 DIAGNOSIS — I10 Essential (primary) hypertension: Secondary | ICD-10-CM

## 2013-06-25 DIAGNOSIS — J449 Chronic obstructive pulmonary disease, unspecified: Secondary | ICD-10-CM

## 2013-06-25 MED ORDER — ALBUTEROL SULFATE HFA 108 (90 BASE) MCG/ACT IN AERS: 2.0000 | INHALATION_SPRAY | RESPIRATORY_TRACT | Status: DC | PRN

## 2013-06-25 MED ORDER — TIOTROPIUM BROMIDE MONOHYDRATE 18 MCG IN CAPS: 18.0000 ug | ORAL_CAPSULE | Freq: Every day | RESPIRATORY_TRACT | Status: DC

## 2013-06-25 MED ORDER — CARVEDILOL 12.5 MG PO TABS: ORAL_TABLET | ORAL | Status: DC

## 2013-06-25 MED ORDER — CYCLOBENZAPRINE HCL 10 MG PO TABS: 10.0000 mg | ORAL_TABLET | Freq: Three times a day (TID) | ORAL | Status: DC | PRN

## 2013-06-25 NOTE — Progress Notes (Signed)
Pre visit review using our clinic review tool, if applicable. No additional management support is needed unless otherwise documented below in the visit note. 

## 2013-06-25 NOTE — Progress Notes (Signed)
OFFICE NOTE  06/30/2013  CC:  Chief Complaint  Patient presents with  . Follow-up  . Shortness of Breath    comes and goes, out of spiriva     HPI: Patient is a 63 y.o. Caucasian female who is here for 1 mo f/u HTN. Last visit added lisinopril 10mg  qd.  Home bp checks have shown bp normalization per pt report. Last visit her cholesterol was up some and I recommended starting atorv and she declined, favoring TLC trial first.  Recently dx'd with large left renal stone and Urologist has plans to do percutaneous nephrectomy. She is holding off on ASA due to upcoming procedure.  Has run out of spireva and her rescue inhaler (COPD and has felt mild chronic SOB since.  Pertinent PMH:  Past medical, surgical, social, and family history reviewed and changes are noted in [place] section of chart.   MEDS:  Outpatient Prescriptions Prior to Visit  Medication Sig Dispense Refill  . doxycycline (ADOXA) 50 MG tablet Take 50 mg by mouth 2 (two) times daily. Take everyday per patient      . lamoTRIgine (LAMICTAL) 100 MG tablet Take 100 mg by mouth 3 (three) times daily.      Marland Kitchen lisinopril (PRINIVIL,ZESTRIL) 10 MG tablet Take 1 tablet (10 mg total) by mouth daily.  30 tablet  1  . montelukast (SINGULAIR) 10 MG tablet Take 10 mg by mouth daily.      Marland Kitchen oxyCODONE (ROXICODONE) 15 MG immediate release tablet Take 15 mg by mouth 3 (three) times daily.      Marland Kitchen venlafaxine XR (EFFEXOR-XR) 150 MG 24 hr capsule Take 150 mg by mouth daily with breakfast.      . zolpidem (AMBIEN) 10 MG tablet Take 10 mg by mouth at bedtime.      . carvedilol (COREG) 6.25 MG tablet Take 2 tablets (12.5 mg total) by mouth 2 (two) times daily with a meal.  30 tablet  0  . aspirin EC 81 MG tablet Take 1 tablet (81 mg total) by mouth daily.  30 tablet  0  . methocarbamol (ROBAXIN-750) 750 MG tablet Take 1 tablet (750 mg total) by mouth 4 (four) times daily.  40 tablet  0   No facility-administered medications prior to visit.     PE: Blood pressure 136/89, pulse 88, temperature 100 F (37.8 C), temperature source Temporal, resp. rate 18, height 5' 6.5" (1.689 m), weight 168 lb (76.204 kg), SpO2 96.00%. Gen: Alert, well appearing.  Patient is oriented to person, place, time, and situation. CV: RRR, no m/r/g LUNGS: CTA bilat, nonlabored.  Some mild purse lipped breathing and prolongation of exp phase.  Aeration is fairly good.  IMPRESSION AND PLAN:  Kidney stone on left side Too large to pass. She'll be getting percutaneous extraction of this soon.  Benign essential HTN The current medical regimen is effective;  continue present plan and medications.   COPD (chronic obstructive pulmonary disease) Restart spireva. Continue albut HFA q4h prn.  Abnormal chest x-ray Pulm vasc congest 05/2013. No sign of volume overload on exam so no diuretic at this time. Will order transthoracic echo at next o/v.  Hyperlipidemia I recommended statin. Pt prefers trial of TLC first.   An After Visit Summary was printed and given to the patient.  FOLLOW UP: 56mo

## 2013-06-27 ENCOUNTER — Other Ambulatory Visit (HOSPITAL_COMMUNITY): Payer: Self-pay | Admitting: Urology

## 2013-06-27 ENCOUNTER — Other Ambulatory Visit: Payer: Self-pay | Admitting: Urology

## 2013-06-27 DIAGNOSIS — N200 Calculus of kidney: Secondary | ICD-10-CM

## 2013-06-30 DIAGNOSIS — R9389 Abnormal findings on diagnostic imaging of other specified body structures: Secondary | ICD-10-CM

## 2013-06-30 DIAGNOSIS — J449 Chronic obstructive pulmonary disease, unspecified: Secondary | ICD-10-CM | POA: Insufficient documentation

## 2013-06-30 HISTORY — DX: Abnormal findings on diagnostic imaging of other specified body structures: R93.89

## 2013-06-30 NOTE — Assessment & Plan Note (Signed)
Restart spireva. Continue albut HFA q4h prn.

## 2013-06-30 NOTE — Assessment & Plan Note (Signed)
Pulm vasc congest 05/2013. No sign of volume overload on exam so no diuretic at this time. Will order transthoracic echo at next o/v.

## 2013-06-30 NOTE — Assessment & Plan Note (Signed)
The current medical regimen is effective;  continue present plan and medications.  

## 2013-06-30 NOTE — Assessment & Plan Note (Signed)
I recommended statin. Pt prefers trial of TLC first.

## 2013-06-30 NOTE — Assessment & Plan Note (Signed)
Too large to pass. She'll be getting percutaneous extraction of this soon.

## 2013-07-18 ENCOUNTER — Other Ambulatory Visit (HOSPITAL_COMMUNITY): Payer: Self-pay | Admitting: Urology

## 2013-07-18 DIAGNOSIS — N200 Calculus of kidney: Secondary | ICD-10-CM

## 2013-07-19 ENCOUNTER — Other Ambulatory Visit: Payer: Self-pay | Admitting: Family Medicine

## 2013-07-19 ENCOUNTER — Telehealth: Payer: Self-pay

## 2013-07-19 NOTE — Telephone Encounter (Signed)
Pls ask pt why this was rx'd for her originally/why does she take it every day? --thx

## 2013-07-19 NOTE — Telephone Encounter (Signed)
Spoke with pt, she states Dr Anitra Lauth refilled all her medications except for her Doxycycline

## 2013-07-19 NOTE — Telephone Encounter (Signed)
Unsure if doxycycline needs to be refilled for patient or not.  Please advise.

## 2013-07-22 ENCOUNTER — Encounter (HOSPITAL_COMMUNITY): Payer: Self-pay | Admitting: Pharmacy Technician

## 2013-07-22 ENCOUNTER — Other Ambulatory Visit: Payer: Self-pay | Admitting: Family Medicine

## 2013-07-23 ENCOUNTER — Ambulatory Visit: Payer: Medicare Other | Admitting: Family Medicine

## 2013-07-23 NOTE — Telephone Encounter (Signed)
I have been trying to get in touch with this patient regarding this medication.  [name] doesn't see in the chart why she takes this medication everyday.  I left 3 messages on patient's phone.

## 2013-07-23 NOTE — Telephone Encounter (Signed)
lmom for pt to CB °

## 2013-07-26 ENCOUNTER — Encounter (HOSPITAL_COMMUNITY)
Admission: RE | Admit: 2013-07-26 | Discharge: 2013-07-26 | Disposition: A | Discharge status: Home / Self Care | Payer: Medicare Other | Source: Ambulatory Visit | Attending: Urology | Admitting: Urology

## 2013-07-26 ENCOUNTER — Encounter (HOSPITAL_COMMUNITY): Payer: Self-pay

## 2013-07-26 ENCOUNTER — Encounter (HOSPITAL_COMMUNITY): Payer: Self-pay | Admitting: Pharmacy Technician

## 2013-07-26 DIAGNOSIS — Z01812 Encounter for preprocedural laboratory examination: Secondary | ICD-10-CM | POA: Insufficient documentation

## 2013-07-26 HISTORY — DX: Gastro-esophageal reflux disease without esophagitis: K21.9

## 2013-07-26 HISTORY — DX: Other allergy status, other than to drugs and biological substances: Z91.09

## 2013-07-26 HISTORY — DX: Shortness of breath: R06.02

## 2013-07-26 LAB — BASIC METABOLIC PANEL
BUN: 10 mg/dL (ref 6–23)
CHLORIDE: 105 meq/L (ref 96–112)
CO2: 28 mEq/L (ref 19–32)
Calcium: 9.6 mg/dL (ref 8.4–10.5)
Creatinine, Ser: 0.86 mg/dL (ref 0.50–1.10)
GFR calc Af Amer: 83 mL/min — ABNORMAL LOW (ref >90)
GFR, EST NON AFRICAN AMERICAN: 72 mL/min — AB (ref >90)
GLUCOSE: 79 mg/dL (ref 70–99)
POTASSIUM: 4.5 meq/L (ref 3.7–5.3)
SODIUM: 142 meq/L (ref 137–147)

## 2013-07-26 LAB — CBC
HEMATOCRIT: 35.4 % — AB (ref 36.0–46.0)
Hemoglobin: 12.1 g/dL (ref 12.0–15.0)
MCH: 29.2 pg (ref 26.0–34.0)
MCHC: 34.2 g/dL (ref 30.0–36.0)
MCV: 85.3 fL (ref 78.0–100.0)
Platelets: 242 10*3/uL (ref 150–400)
RBC: 4.15 MIL/uL (ref 3.87–5.11)
RDW: 12.6 % (ref 11.5–15.5)
WBC: 4.9 10*3/uL (ref 4.0–10.5)

## 2013-07-26 NOTE — Patient Instructions (Addendum)
Your procedure is scheduled on:  08/05/13  MONDAY  Report to [facility] at  0730     AM.  Call this number if you have problems the morning of surgery: [place]   Do not eat food  Or drink :After Midnight.SUNDAY NIGHT   Take these medicines the morning of surgery with A SIP OF WATER:CARVEDILOL, VANLAFAXINE, ALPRAZOLAM , ZANTAC, OXYCODONE       USE SPIRIVIA May take cyclobenzaprine,,,, or/and use ALBUTEROL IF NEEDED   .  Contacts, dentures or partial plates, or metal hairpins  can not be worn to surgery. Your family will be responsible for glasses, dentures, hearing aides while you are in surgery  Leave suitcase in the car. After surgery it may be brought to your room.  For patients admitted to the hospital, checkout time is 11:00 AM day of  discharge.                DO NOT WEAR JEWELRY, LOTIONS, POWDERS, OR PERFUMES.  WOMEN-- DO NOT SHAVE LEGS OR UNDERARMS FOR 48 HOURS BEFORE SHOWERS. MEN MAY SHAVE FACE.  Patients discharged the day of surgery will not be allowed to drive home. IF going home the day of surgery, you must have a driver and someone to stay with you for the first 24 hours                                                                   [facility] - Preparing for Surgery Before surgery, you can play an important role.  Because skin is not sterile, your skin needs to be as free of germs as possible.  You can reduce the number of germs on your skin by washing with CHG (chlorahexidine gluconate) soap before surgery.  CHG is an antiseptic cleaner which kills germs and bonds with the skin to continue killing germs even after washing. Please DO NOT use if you have an allergy to CHG or antibacterial soaps.  If your skin becomes reddened/irritated stop using the CHG and inform your nurse when you arrive at Short Stay. Do not shave (including legs and underarms) for at least 48 hours prior to the first CHG shower.  You may shave  your face. Please follow these instructions carefully:  1.  Shower with CHG Soap the night before surgery and the  morning of Surgery.  2.  If you choose to wash your hair, wash your hair first as usual with your  normal  shampoo.  3.  After you shampoo, rinse your hair and body thoroughly to remove the  shampoo.                           4.  Use CHG as you would any other liquid soap.  You can apply chg directly  to the skin and wash                       Gently with a scrungie or clean washcloth.  5.  Apply the CHG Soap to your body ONLY FROM THE NECK DOWN.   Do not use on open  Wound or open sores. Avoid contact with eyes, ears mouth and genitals (private parts).                        Genitals (private parts) with your normal soap.             6.  Wash thoroughly, paying special attention to the area where your surgery  will be performed.  7.  Thoroughly rinse your body with warm water from the neck down.  8.  DO NOT shower/wash with your normal soap after using and rinsing off  the CHG Soap.                9.  Pat yourself dry with a clean towel.            10.  Wear clean pajamas.            11.  Place clean sheets on your bed the night of your first shower and do not  sleep with pets. Day of Surgery : Do not apply any lotions/deodorants the morning of surgery.  Please wear clean clothes to the hospital/surgery center.  FAILURE TO FOLLOW THESE INSTRUCTIONS MAY RESULT IN THE CANCELLATION OF YOUR SURGERY PATIENT SIGNATURE_________________________________  NURSE SIGNATURE__________________________________

## 2013-07-26 NOTE — Progress Notes (Signed)
EKG, Chest x ray 2/15, LOV Dr Melford Aase 3/15 ALL IN EPIC

## 2013-07-29 ENCOUNTER — Ambulatory Visit (INDEPENDENT_AMBULATORY_CARE_PROVIDER_SITE_OTHER): Payer: Medicare Other | Admitting: Family Medicine

## 2013-07-29 ENCOUNTER — Other Ambulatory Visit: Payer: Self-pay | Admitting: Family Medicine

## 2013-07-29 ENCOUNTER — Encounter: Payer: Self-pay | Admitting: Family Medicine

## 2013-07-29 VITALS — BP 132/82 | HR 79 | Temp 99.2°F | Resp 18 | Ht 66.5 in | Wt 163.0 lb

## 2013-07-29 DIAGNOSIS — K5909 Other constipation: Secondary | ICD-10-CM | POA: Insufficient documentation

## 2013-07-29 DIAGNOSIS — J449 Chronic obstructive pulmonary disease, unspecified: Secondary | ICD-10-CM

## 2013-07-29 DIAGNOSIS — I10 Essential (primary) hypertension: Secondary | ICD-10-CM

## 2013-07-29 DIAGNOSIS — K5900 Constipation, unspecified: Secondary | ICD-10-CM

## 2013-07-29 DIAGNOSIS — N200 Calculus of kidney: Secondary | ICD-10-CM

## 2013-07-29 DIAGNOSIS — R350 Frequency of micturition: Secondary | ICD-10-CM

## 2013-07-29 LAB — POCT URINALYSIS DIPSTICK
Bilirubin, UA: NEGATIVE
Glucose, UA: NEGATIVE
Ketones, UA: NEGATIVE
Nitrite, UA: NEGATIVE
PH UA: 6.5
PROTEIN UA: NEGATIVE
RBC UA: NEGATIVE
SPEC GRAV UA: 1.01
Urobilinogen, UA: 0.2

## 2013-07-29 MED ORDER — POLYETHYLENE GLYCOL 3350 17 GM/SCOOP PO POWD: ORAL | Status: DC

## 2013-07-29 NOTE — Progress Notes (Signed)
Pre visit review using our clinic review tool, if applicable. No additional management support is needed unless otherwise documented below in the visit note. 

## 2013-07-29 NOTE — Progress Notes (Signed)
OFFICE NOTE  07/29/2013  CC:  Chief Complaint  Patient presents with  . Follow-up    4 week  . Urinary Frequency     HPI: Patient is a 63 y.o. Caucasian female who is here for 1 mo f/u HTN and COPD, also has urinary frequency. Home bp's: AVg syst 542, avg diastolic 70W, avg HR 23J. No fevers, no SOB, no CP, no significant problem with coughing. Complains of 2 wk hx of increased urinary frequency, no dysuria, no hematuria.  No flank pain, no fever.  No vag discharge. Some constipation lately, taking milk of mag but apparently not taking it regularly enough for it to help. Has not had a "good" BM in about a week, last BM she had it hurt.   Takes oxycodone regularly for her upper back/shoulders/neck pains.  Has plans for renal stone extraction next week.  Pertinent PMH:  Past medical, surgical, social hx reviewed.  MEDS:  Outpatient Prescriptions Prior to Visit  Medication Sig Dispense Refill  . albuterol (PROVENTIL HFA) 108 (90 BASE) MCG/ACT inhaler Inhale 2 puffs into the lungs every 4 (four) hours as needed for wheezing or shortness of breath.  1 Inhaler  1  . ALPRAZolam (XANAX) 1 MG tablet Take 1 mg by mouth 3 (three) times daily.      [place] Kitchen amoxicillin (AMOXIL) 500 MG capsule Take 500 mg by mouth 2 (two) times daily.      . carvedilol (COREG) 12.5 MG tablet Take 12.5 mg by mouth 2 (two) times daily with a meal.      . cetirizine (ZYRTEC) 10 MG tablet Take 10 mg by mouth daily.      . cyclobenzaprine (FLEXERIL) 10 MG tablet Take 10 mg by mouth 3 (three) times daily as needed for muscle spasms.      [place] Kitchen lamoTRIgine (LAMICTAL) 100 MG tablet Take 300 mg by mouth at bedtime.       [place] Kitchen lisinopril (PRINIVIL,ZESTRIL) 10 MG tablet Take 10 mg by mouth at bedtime.      . magnesium hydroxide (MILK OF MAGNESIA) 400 MG/5ML suspension Take by mouth daily as needed for mild constipation.      . montelukast (SINGULAIR) 10 MG tablet Take 10 mg by mouth at bedtime.      [place] Kitchen oxyCODONE (ROXICODONE) 15 MG  immediate release tablet Take 15 mg by mouth 3 (three) times daily.      . ranitidine (ZANTAC) 150 MG capsule Take 150 mg by mouth 2 (two) times daily.      [place] Kitchen tiotropium (SPIRIVA HANDIHALER) 18 MCG inhalation capsule Place 1 capsule (18 mcg total) into inhaler and inhale daily.  30 capsule  12  . venlafaxine XR (EFFEXOR-XR) 150 MG 24 hr capsule Take 150 mg by mouth daily with breakfast.      . zolpidem (AMBIEN) 10 MG tablet Take 10 mg by mouth at bedtime.      Mar[place] Kitchen lisinopril (PRINIVIL,ZESTRIL) 10 MG tablet TAKE 1 TABLET (10 MG TOTAL) BY MOUTH DAILY.  30 tablet  1   No facility-administered medications prior to visit.    PE: Blood pressure 132/82, pulse 79, temperature 99.2 F (37.3 C), temperature source Temporal, resp. rate 18, height 5' 6.5" (1.689 m), weight 163 lb (73.936 kg), SpO2 94.00%. Gen: Alert, well appearing.  Patient is oriented to person, place, time, and situation. CV: RRR, no m/r/g.   LUNGS: CTA bilat, nonlabored resps, good aeration in all lung fields. EXT: no clubbing, cyanosis, or edema.   LAB: CC UA today  small LEU, o/w normal  Recent:  Lab Results  Component Value Date   WBC 4.9 07/26/2013   HGB 12.1 07/26/2013   HCT 35.4* 07/26/2013   MCV 85.3 07/26/2013   PLT 242 07/26/2013     Chemistry      Component Value Date/Time   NA 142 07/26/2013 1155   K 4.5 07/26/2013 1155   CL 105 07/26/2013 1155   CO2 28 07/26/2013 1155   BUN 10 07/26/2013 1155   CREATININE 0.86 07/26/2013 1155      Component Value Date/Time   CALCIUM 9.6 07/26/2013 1155   ALKPHOS 62 05/15/2013 2021   AST 24 05/15/2013 2021   ALT 36* 05/15/2013 2021   BILITOT <0.2* 05/15/2013 2021       IMPRESSION AND PLAN:  Benign essential HTN The current medical regimen is effective;  continue present plan and medications.   Constipation, chronic Acute-on-chronic: mag citrate x 1 bottle, then start daily miralax.  Urinary frequency Send urine for C/S.  No abx at this time.   An After Visit Summary was  printed and given to the patient.  FOLLOW UP: 47mo

## 2013-07-29 NOTE — Patient Instructions (Signed)
Take OTC magnesium citrate  (1 bottle) to evacuate your bowels, then start once daily miralax as per your current med list.

## 2013-08-01 ENCOUNTER — Other Ambulatory Visit: Payer: Self-pay | Admitting: Radiology

## 2013-08-01 DIAGNOSIS — R350 Frequency of micturition: Secondary | ICD-10-CM

## 2013-08-01 HISTORY — DX: Frequency of micturition: R35.0

## 2013-08-01 NOTE — Assessment & Plan Note (Signed)
Acute-on-chronic: mag citrate x 1 bottle, then start daily miralax.

## 2013-08-01 NOTE — Assessment & Plan Note (Signed)
The current medical regimen is effective;  continue present plan and medications.  

## 2013-08-01 NOTE — Assessment & Plan Note (Signed)
For percutaneous nephrostomy/removal of stone next week.

## 2013-08-01 NOTE — Assessment & Plan Note (Signed)
Send urine for C/S.  No abx at this time.

## 2013-08-02 DIAGNOSIS — N200 Calculus of kidney: Secondary | ICD-10-CM

## 2013-08-02 HISTORY — DX: Calculus of kidney: N20.0

## 2013-08-02 NOTE — H&P (Signed)
History of Present Illness   [name] presents today as a referral from Dr Julien Nordmann for further assessment and potential treatment of a large left renal calculus. Erin Lopez is currently 63 years of age. In 2007, she was seen here by Dr Joelyn Oms who was subsequently retired from our practice. At that time she had been diagnosed with a nonobstructing asymptomatic 7 mm left renal calculus. No treatment was recommended at that time. Recently the patient went to the emergency room due to some lability of her blood pressure. As part of that assessment she had a chest x-ray which showed a questionable large left renal stone. She has subsequently had CT imaging. This shows a very dense 1.5 cm x 2.5 cm stone in the left renal pelvis. It does not appear to be causing any high-grade obstruction, although there may be potentially some mild calyceal dilation. I have reviewed her images as well as the report. Hounsfield units to my reading were around 13-1400 and the stone does appear to be markedly dense. There is a second 5 mm stone in the lower pole calyx. She has had no significant issues with flank pain, gross hematuria. She does have a little bit of urinary hesitancy.      Past Medical History Problems  1. History of Anxiety (Symptom) (799.2) 2. History of Arthritis (V13.4) 3. History of Asthma (493.90) 4. History of Cancer (199.1) 5. History of depression (V11.8) 6. History of heartburn (V12.79) 7. History of hypercholesterolemia (V12.29) 8. History of hypertension (V12.59) 9. History of Ischemic Stroke (436)  Surgical History Problems  1. History of Cesarean Section 2. History of Knee Replacement 3. History of Neck Surgery  Current Meds 1. Adult Aspirin Low Strength 81 MG CHEW;  Therapy: (Recorded:20Mar2015) to Recorded 2. Carvedilol 6.25 MG Oral Tablet;  Therapy: 24Sep2014 to Recorded 3. Doxycycline Hyclate 50 MG Oral Capsule;  Therapy: [id number] to Recorded 4. LamoTRIgine 100 MG Oral  Tablet;  Therapy: [id number] to Recorded 5. Lisinopril 10 MG Oral Tablet;  Therapy: [id number] to Recorded 6. Methocarbamol 750 MG Oral Tablet;  Therapy: (Recorded:20Mar2015) to Recorded 7. Montelukast Sodium 10 MG Oral Tablet;  Therapy: [id number] to Recorded 8. OxyCODONE HCl - 15 MG Oral Tablet;  Therapy: [id number] to Recorded 9. Venlafaxine HCl ER 150 MG Oral Capsule Extended Release 24 Hour;  Therapy: 24Sep2014 to Recorded 10. Zolpidem Tartrate 10 MG Oral Tablet;   Therapy: (Recorded:20Mar2015) to Recorded  Allergies Medication  1. Cefotan SOLN  Family History Problems  1. Family history of Parkinson's disease (V17.2) : Mother 2. Family history of Prostate Cancer (V16.42)   Dad 3. Family history of Urologic Disorder (V18.7)   kidney stones, dad  Social History Problems    Denied: History of Alcohol use   Caffeine Use   3 per day   Family history of Death In The Family Mother   Father deceased   76yrs, cancer   Former smoker (V15.82)   Marital History - Divorced   Mother deceased   34yrs, parkinson   Occupation:   disabled   Tobacco Use (V15.82)   1 pack per day for 20 years, quit 3 yrs ago  Review of Systems Genitourinary, constitutional, skin, eye, otolaryngeal, hematologic/lymphatic, cardiovascular, pulmonary, endocrine, musculoskeletal, gastrointestinal, neurological and psychiatric system(s) were reviewed and pertinent findings if present are noted.  Genitourinary: urinary hesitancy, but no dysuria and no hematuria.  Gastrointestinal: constipation.  Constitutional: feeling tired (fatigue).  Respiratory: shortness of breath.  Musculoskeletal: back pain and joint pain.  Psychiatric: depression  and anxiety.    Vitals  Height: 5 ft 6.5 in Weight: 165 lb  BMI Calculated: 26.23 BSA Calculated: 1.85 Blood Pressure: 122 / 84 Temperature: 98 F Heart Rate: 78  Physical Exam Constitutional: Well nourished and well developed . No acute  distress.  ENT:. The ears and nose are normal in appearance.  Pulmonary: No respiratory distress and normal respiratory rhythm and effort.  Cardiovascular: Heart rate and rhythm are normal . No peripheral edema.  Abdomen: The abdomen is soft and nontender. No masses are palpated. No CVA tenderness. No hernias are palpable. No hepatosplenomegaly noted.  Skin: Normal skin turgor, no visible rash and no visible skin lesions.  Neuro/Psych:. Mood and affect are appropriate.    Results/Data Urine COLOR YELLOW  APPEARANCE CLEAR  SPECIFIC GRAVITY 1.020  pH 6.5  GLUCOSE NEG mg/dL BILIRUBIN NEG  KETONE TRACE mg/dL BLOOD NEG  PROTEIN NEG mg/dL UROBILINOGEN 0.2 mg/dL NITRITE NEG  LEUKOCYTE ESTERASE NEG   Assessment Assessed  1. Nephrolithiasis (592.0)  Discussion/Summary   Ms Schmaltz has a large very dense stone in the left renal pelvis. There is currently minimal obstruction. The stone has been present for quite some time but clearly does need to be treated. Over time the stone is likely to cause some silent obstruction of her kidney and is likely to cause renal damage. Given the density of the stone and its size, I do not think she would do well with lithotripsy. The overall stone burden would be much too large to pass from a fragment standpoint. This is a stone that is much more amenable to percutaneous nephrectomy. We would ask Interventional Radiology to gain access the morning of the surgery and then we will perform percutaneous nephrectomy in the OR. We did discuss that surgery in detail today. I would anticipate at least overnight stay in the hospital. We will set something up hopefully in the next several weeks.

## 2013-08-05 ENCOUNTER — Ambulatory Visit (HOSPITAL_COMMUNITY)
Admission: RE | Admit: 2013-08-05 | Discharge: 2013-08-05 | Disposition: A | Discharge status: Home / Self Care | Payer: Medicare Other | Source: Ambulatory Visit | Attending: Urology | Admitting: Urology

## 2013-08-05 ENCOUNTER — Ambulatory Visit (HOSPITAL_COMMUNITY): Payer: Medicare Other | Admitting: Certified Registered Nurse Anesthetist

## 2013-08-05 ENCOUNTER — Encounter (HOSPITAL_COMMUNITY): Payer: Self-pay | Admitting: *Deleted

## 2013-08-05 ENCOUNTER — Ambulatory Visit (HOSPITAL_COMMUNITY): Payer: Medicare Other

## 2013-08-05 ENCOUNTER — Encounter (HOSPITAL_COMMUNITY)
Admission: RE | Disposition: A | Discharge status: Home / Self Care | Payer: Self-pay | Source: Ambulatory Visit | Attending: Urology

## 2013-08-05 ENCOUNTER — Encounter (HOSPITAL_COMMUNITY): Payer: Medicare Other | Admitting: Certified Registered Nurse Anesthetist

## 2013-08-05 ENCOUNTER — Observation Stay (HOSPITAL_COMMUNITY): Payer: Medicare Other

## 2013-08-05 ENCOUNTER — Inpatient Hospital Stay (HOSPITAL_COMMUNITY)
Admission: RE | Admit: 2013-08-05 | Discharge: 2013-08-09 | DRG: 660 | Disposition: A | Discharge status: Home / Self Care | Payer: Medicare Other | Source: Ambulatory Visit | Attending: Urology | Admitting: Urology

## 2013-08-05 VITALS — BP 131/64 | HR 99 | Temp 99.0°F | Resp 10

## 2013-08-05 DIAGNOSIS — Y849 Medical procedure, unspecified as the cause of abnormal reaction of the patient, or of later complication, without mention of misadventure at the time of the procedure: Secondary | ICD-10-CM | POA: Diagnosis not present

## 2013-08-05 DIAGNOSIS — IMO0002 Reserved for concepts with insufficient information to code with codable children: Secondary | ICD-10-CM | POA: Diagnosis not present

## 2013-08-05 DIAGNOSIS — E785 Hyperlipidemia, unspecified: Secondary | ICD-10-CM | POA: Diagnosis present

## 2013-08-05 DIAGNOSIS — I10 Essential (primary) hypertension: Secondary | ICD-10-CM | POA: Diagnosis present

## 2013-08-05 DIAGNOSIS — N200 Calculus of kidney: Principal | ICD-10-CM | POA: Diagnosis present

## 2013-08-05 DIAGNOSIS — Z87891 Personal history of nicotine dependence: Secondary | ICD-10-CM

## 2013-08-05 DIAGNOSIS — Z881 Allergy status to other antibiotic agents status: Secondary | ICD-10-CM

## 2013-08-05 DIAGNOSIS — M129 Arthropathy, unspecified: Secondary | ICD-10-CM | POA: Diagnosis present

## 2013-08-05 DIAGNOSIS — IMO0001 Reserved for inherently not codable concepts without codable children: Secondary | ICD-10-CM | POA: Diagnosis present

## 2013-08-05 DIAGNOSIS — K219 Gastro-esophageal reflux disease without esophagitis: Secondary | ICD-10-CM | POA: Diagnosis present

## 2013-08-05 DIAGNOSIS — F411 Generalized anxiety disorder: Secondary | ICD-10-CM | POA: Diagnosis present

## 2013-08-05 DIAGNOSIS — Z8042 Family history of malignant neoplasm of prostate: Secondary | ICD-10-CM

## 2013-08-05 DIAGNOSIS — Z96659 Presence of unspecified artificial knee joint: Secondary | ICD-10-CM

## 2013-08-05 DIAGNOSIS — E559 Vitamin D deficiency, unspecified: Secondary | ICD-10-CM | POA: Diagnosis present

## 2013-08-05 DIAGNOSIS — Z8673 Personal history of transient ischemic attack (TIA), and cerebral infarction without residual deficits: Secondary | ICD-10-CM

## 2013-08-05 DIAGNOSIS — Z888 Allergy status to other drugs, medicaments and biological substances status: Secondary | ICD-10-CM

## 2013-08-05 DIAGNOSIS — Z820 Family history of epilepsy and other diseases of the nervous system: Secondary | ICD-10-CM

## 2013-08-05 DIAGNOSIS — M949 Disorder of cartilage, unspecified: Secondary | ICD-10-CM

## 2013-08-05 DIAGNOSIS — R3911 Hesitancy of micturition: Secondary | ICD-10-CM | POA: Diagnosis present

## 2013-08-05 DIAGNOSIS — E7800 Pure hypercholesterolemia, unspecified: Secondary | ICD-10-CM | POA: Diagnosis present

## 2013-08-05 DIAGNOSIS — Z8249 Family history of ischemic heart disease and other diseases of the circulatory system: Secondary | ICD-10-CM

## 2013-08-05 DIAGNOSIS — F1021 Alcohol dependence, in remission: Secondary | ICD-10-CM | POA: Diagnosis present

## 2013-08-05 DIAGNOSIS — Z01812 Encounter for preprocedural laboratory examination: Secondary | ICD-10-CM

## 2013-08-05 DIAGNOSIS — R413 Other amnesia: Secondary | ICD-10-CM | POA: Diagnosis present

## 2013-08-05 DIAGNOSIS — R710 Precipitous drop in hematocrit: Secondary | ICD-10-CM | POA: Diagnosis not present

## 2013-08-05 DIAGNOSIS — Z833 Family history of diabetes mellitus: Secondary | ICD-10-CM

## 2013-08-05 DIAGNOSIS — Z85828 Personal history of other malignant neoplasm of skin: Secondary | ICD-10-CM

## 2013-08-05 DIAGNOSIS — M899 Disorder of bone, unspecified: Secondary | ICD-10-CM | POA: Diagnosis present

## 2013-08-05 DIAGNOSIS — F319 Bipolar disorder, unspecified: Secondary | ICD-10-CM | POA: Diagnosis present

## 2013-08-05 DIAGNOSIS — J45909 Unspecified asthma, uncomplicated: Secondary | ICD-10-CM | POA: Diagnosis present

## 2013-08-05 DIAGNOSIS — Z79899 Other long term (current) drug therapy: Secondary | ICD-10-CM

## 2013-08-05 HISTORY — PX: NEPHROLITHOTOMY: SHX5134

## 2013-08-05 HISTORY — DX: Calculus of kidney: N20.0

## 2013-08-05 LAB — BASIC METABOLIC PANEL
BUN: 16 mg/dL (ref 6–23)
BUN: 15 mg/dL (ref 6–23)
CALCIUM: 8.1 mg/dL — AB (ref 8.4–10.5)
CHLORIDE: 103 meq/L (ref 96–112)
CO2: 24 mEq/L (ref 19–32)
CO2: 30 mEq/L (ref 19–32)
CREATININE: 0.9 mg/dL (ref 0.50–1.10)
Calcium: 9 mg/dL (ref 8.4–10.5)
Chloride: 107 mEq/L (ref 96–112)
Creatinine, Ser: 0.83 mg/dL (ref 0.50–1.10)
GFR calc Af Amer: 87 mL/min — ABNORMAL LOW (ref >90)
GFR calc Af Amer: 79 mL/min — ABNORMAL LOW (ref >90)
GFR calc non Af Amer: 75 mL/min — ABNORMAL LOW (ref >90)
GFR calc non Af Amer: 68 mL/min — ABNORMAL LOW (ref >90)
GLUCOSE: 102 mg/dL — AB (ref 70–99)
Glucose, Bld: 107 mg/dL — ABNORMAL HIGH (ref 70–99)
Potassium: 4.4 mEq/L (ref 3.7–5.3)
Potassium: 4.2 mEq/L (ref 3.7–5.3)
SODIUM: 138 meq/L (ref 137–147)
Sodium: 142 mEq/L (ref 137–147)

## 2013-08-05 LAB — CBC WITH DIFFERENTIAL/PLATELET
BASOS ABS: 0 10*3/uL (ref 0.0–0.1)
Basophils Relative: 1 % (ref 0–1)
Eosinophils Absolute: 0.2 10*3/uL (ref 0.0–0.7)
Eosinophils Relative: 4 % (ref 0–5)
HEMATOCRIT: 34.3 % — AB (ref 36.0–46.0)
Hemoglobin: 12 g/dL (ref 12.0–15.0)
LYMPHS PCT: 33 % (ref 12–46)
Lymphs Abs: 1.5 10*3/uL (ref 0.7–4.0)
MCH: 29.9 pg (ref 26.0–34.0)
MCHC: 35 g/dL (ref 30.0–36.0)
MCV: 85.5 fL (ref 78.0–100.0)
MONOS PCT: 10 % (ref 3–12)
Monocytes Absolute: 0.5 10*3/uL (ref 0.1–1.0)
NEUTROS ABS: 2.4 10*3/uL (ref 1.7–7.7)
Neutrophils Relative %: 53 % (ref 43–77)
Platelets: 209 10*3/uL (ref 150–400)
RBC: 4.01 MIL/uL (ref 3.87–5.11)
RDW: 12.7 % (ref 11.5–15.5)
WBC: 4.6 10*3/uL (ref 4.0–10.5)

## 2013-08-05 LAB — PROTIME-INR
INR: 1.03 (ref 0.00–1.49)
Prothrombin Time: 13.3 seconds (ref 11.6–15.2)

## 2013-08-05 LAB — APTT: aPTT: 36 seconds (ref 24–37)

## 2013-08-05 LAB — TYPE AND SCREEN
ABO/RH(D): O POS
ANTIBODY SCREEN: NEGATIVE

## 2013-08-05 LAB — HEMOGLOBIN AND HEMATOCRIT, BLOOD
HCT: 31.1 % — ABNORMAL LOW (ref 36.0–46.0)
Hemoglobin: 10.4 g/dL — ABNORMAL LOW (ref 12.0–15.0)

## 2013-08-05 SURGERY — NEPHROLITHOTOMY PERCUTANEOUS
Anesthesia: General | Site: Flank | Laterality: Left

## 2013-08-05 MED ORDER — HYDROMORPHONE HCL PF 1 MG/ML IJ SOLN
0.2500 mg | INTRAMUSCULAR | Status: DC | PRN
  Administered 2013-08-05: 0.5 mg via INTRAVENOUS

## 2013-08-05 MED ORDER — DEXAMETHASONE SODIUM PHOSPHATE 10 MG/ML IJ SOLN
INTRAMUSCULAR | Status: AC
  Filled 2013-08-05: qty 1

## 2013-08-05 MED ORDER — SODIUM CHLORIDE 0.9 % IV SOLN
INTRAVENOUS | Status: DC
  Administered 2013-08-05: 08:00:00 via INTRAVENOUS

## 2013-08-05 MED ORDER — HYDROMORPHONE HCL PF 1 MG/ML IJ SOLN
INTRAMUSCULAR | Status: AC
  Filled 2013-08-05: qty 1

## 2013-08-05 MED ORDER — MORPHINE SULFATE 4 MG/ML IJ SOLN
2.0000 mg | INTRAMUSCULAR | Status: DC | PRN
  Administered 2013-08-05 – 2013-08-07 (×10): 4 mg via INTRAVENOUS
  Administered 2013-08-08: 2 mg via INTRAVENOUS
  Filled 2013-08-05 (×10): qty 1

## 2013-08-05 MED ORDER — MONTELUKAST SODIUM 10 MG PO TABS
10.0000 mg | ORAL_TABLET | Freq: Every day | ORAL | Status: DC
  Administered 2013-08-05 – 2013-08-08 (×4): 10 mg via ORAL
  Filled 2013-08-05 (×5): qty 1

## 2013-08-05 MED ORDER — NEOSTIGMINE METHYLSULFATE 10 MG/10ML IV SOLN
INTRAVENOUS | Status: AC
  Filled 2013-08-05: qty 1

## 2013-08-05 MED ORDER — ONDANSETRON HCL 4 MG/2ML IJ SOLN
INTRAMUSCULAR | Status: AC
  Filled 2013-08-05: qty 2

## 2013-08-05 MED ORDER — CIPROFLOXACIN IN D5W 400 MG/200ML IV SOLN
400.0000 mg | Freq: Two times a day (BID) | INTRAVENOUS | Status: AC
  Administered 2013-08-05: 400 mg via INTRAVENOUS
  Filled 2013-08-05: qty 200

## 2013-08-05 MED ORDER — PROPOFOL 10 MG/ML IV BOLUS
INTRAVENOUS | Status: AC
  Filled 2013-08-05: qty 20

## 2013-08-05 MED ORDER — ALBUTEROL SULFATE (2.5 MG/3ML) 0.083% IN NEBU: 2.5000 mg | INHALATION_SOLUTION | RESPIRATORY_TRACT | Status: DC | PRN

## 2013-08-05 MED ORDER — OXYCODONE HCL 5 MG PO TABS: 5.0000 mg | ORAL_TABLET | Freq: Once | ORAL | Status: DC | PRN

## 2013-08-05 MED ORDER — ROCURONIUM BROMIDE 100 MG/10ML IV SOLN
INTRAVENOUS | Status: DC | PRN
  Administered 2013-08-05: 30 mg via INTRAVENOUS

## 2013-08-05 MED ORDER — KCL IN DEXTROSE-NACL 20-5-0.45 MEQ/L-%-% IV SOLN
INTRAVENOUS | Status: AC
  Filled 2013-08-05: qty 1000

## 2013-08-05 MED ORDER — EPHEDRINE SULFATE 50 MG/ML IJ SOLN
INTRAMUSCULAR | Status: AC
  Filled 2013-08-05: qty 1

## 2013-08-05 MED ORDER — OXYCODONE-ACETAMINOPHEN 5-325 MG PO TABS
1.0000 | ORAL_TABLET | ORAL | Status: DC | PRN
  Administered 2013-08-05 – 2013-08-08 (×9): 2 via ORAL
  Filled 2013-08-05 (×10): qty 2

## 2013-08-05 MED ORDER — HYDROMORPHONE HCL PF 1 MG/ML IJ SOLN
0.2500 mg | INTRAMUSCULAR | Status: DC | PRN
  Administered 2013-08-05 (×4): 0.5 mg via INTRAVENOUS

## 2013-08-05 MED ORDER — LAMOTRIGINE 150 MG PO TABS
300.0000 mg | ORAL_TABLET | Freq: Every day | ORAL | Status: DC
  Administered 2013-08-05 – 2013-08-08 (×4): 300 mg via ORAL
  Filled 2013-08-05 (×5): qty 2

## 2013-08-05 MED ORDER — SODIUM CHLORIDE 0.9 % IJ SOLN
INTRAMUSCULAR | Status: AC
  Filled 2013-08-05: qty 10

## 2013-08-05 MED ORDER — VENLAFAXINE HCL ER 150 MG PO CP24
150.0000 mg | ORAL_CAPSULE | Freq: Every day | ORAL | Status: DC
  Administered 2013-08-06 – 2013-08-09 (×4): 150 mg via ORAL
  Filled 2013-08-05 (×5): qty 1

## 2013-08-05 MED ORDER — MEPERIDINE HCL 50 MG/ML IJ SOLN: 6.2500 mg | INTRAMUSCULAR | Status: DC | PRN

## 2013-08-05 MED ORDER — CIPROFLOXACIN IN D5W 400 MG/200ML IV SOLN
INTRAVENOUS | Status: AC
  Filled 2013-08-05: qty 200

## 2013-08-05 MED ORDER — IOHEXOL 300 MG/ML  SOLN: 25.0000 mL | Freq: Once | INTRAMUSCULAR | Status: DC | PRN

## 2013-08-05 MED ORDER — SODIUM CHLORIDE 0.9 % IR SOLN
Status: DC | PRN
  Administered 2013-08-05 (×4): 3000 mL
  Administered 2013-08-05: 1000 mL

## 2013-08-05 MED ORDER — CIPROFLOXACIN IN D5W 400 MG/200ML IV SOLN
400.0000 mg | INTRAVENOUS | Status: AC
  Administered 2013-08-05: 400 mg via INTRAVENOUS
  Filled 2013-08-05: qty 200

## 2013-08-05 MED ORDER — ALPRAZOLAM 1 MG PO TABS
1.0000 mg | ORAL_TABLET | Freq: Three times a day (TID) | ORAL | Status: DC
  Administered 2013-08-05 – 2013-08-09 (×9): 1 mg via ORAL
  Filled 2013-08-05 (×9): qty 1

## 2013-08-05 MED ORDER — FENTANYL CITRATE 0.05 MG/ML IJ SOLN
INTRAMUSCULAR | Status: AC
  Filled 2013-08-05: qty 6

## 2013-08-05 MED ORDER — PROPOFOL 10 MG/ML IV BOLUS
INTRAVENOUS | Status: DC | PRN
  Administered 2013-08-05: 150 mg via INTRAVENOUS

## 2013-08-05 MED ORDER — LACTATED RINGERS IV SOLN
INTRAVENOUS | Status: DC
  Administered 2013-08-05: 13:00:00 via INTRAVENOUS
  Administered 2013-08-05: 1000 mL via INTRAVENOUS

## 2013-08-05 MED ORDER — PHENYLEPHRINE 40 MCG/ML (10ML) SYRINGE FOR IV PUSH (FOR BLOOD PRESSURE SUPPORT)
PREFILLED_SYRINGE | INTRAVENOUS | Status: AC
  Filled 2013-08-05: qty 10

## 2013-08-05 MED ORDER — LIDOCAINE HCL (CARDIAC) 20 MG/ML IV SOLN
INTRAVENOUS | Status: AC
  Filled 2013-08-05: qty 5

## 2013-08-05 MED ORDER — MAGNESIUM HYDROXIDE 400 MG/5ML PO SUSP
15.0000 mL | Freq: Every day | ORAL | Status: DC | PRN
  Administered 2013-08-05: 15 mL via ORAL
  Filled 2013-08-05: qty 30

## 2013-08-05 MED ORDER — MIDAZOLAM HCL 2 MG/2ML IJ SOLN
INTRAMUSCULAR | Status: AC | PRN
  Administered 2013-08-05 (×3): 1 mg via INTRAVENOUS

## 2013-08-05 MED ORDER — KCL IN DEXTROSE-NACL 20-5-0.45 MEQ/L-%-% IV SOLN
INTRAVENOUS | Status: DC
  Administered 2013-08-05 – 2013-08-09 (×5): via INTRAVENOUS
  Filled 2013-08-05 (×9): qty 1000

## 2013-08-05 MED ORDER — LISINOPRIL 10 MG PO TABS
10.0000 mg | ORAL_TABLET | Freq: Every day | ORAL | Status: DC
  Administered 2013-08-05 – 2013-08-08 (×4): 10 mg via ORAL
  Filled 2013-08-05 (×5): qty 1

## 2013-08-05 MED ORDER — MIDAZOLAM HCL 2 MG/2ML IJ SOLN
INTRAMUSCULAR | Status: AC
  Filled 2013-08-05: qty 2

## 2013-08-05 MED ORDER — FENTANYL CITRATE 0.05 MG/ML IJ SOLN
INTRAMUSCULAR | Status: DC | PRN
  Administered 2013-08-05 (×2): 50 ug via INTRAVENOUS
  Administered 2013-08-05: 25 ug via INTRAVENOUS
  Administered 2013-08-05: 50 ug via INTRAVENOUS
  Administered 2013-08-05: 25 ug via INTRAVENOUS
  Administered 2013-08-05: 50 ug via INTRAVENOUS

## 2013-08-05 MED ORDER — OXYBUTYNIN CHLORIDE 5 MG PO TABS
5.0000 mg | ORAL_TABLET | Freq: Three times a day (TID) | ORAL | Status: DC | PRN
  Administered 2013-08-05: 5 mg via ORAL
  Filled 2013-08-05 (×2): qty 1

## 2013-08-05 MED ORDER — ZOLPIDEM TARTRATE 5 MG PO TABS
5.0000 mg | ORAL_TABLET | Freq: Every day | ORAL | Status: DC
  Administered 2013-08-05 – 2013-08-08 (×3): 5 mg via ORAL
  Filled 2013-08-05 (×3): qty 1

## 2013-08-05 MED ORDER — MIDAZOLAM HCL 5 MG/5ML IJ SOLN
INTRAMUSCULAR | Status: DC | PRN
  Administered 2013-08-05 (×2): 1 mg via INTRAVENOUS

## 2013-08-05 MED ORDER — PHENYLEPHRINE HCL 10 MG/ML IJ SOLN
INTRAMUSCULAR | Status: DC | PRN
  Administered 2013-08-05: 80 ug via INTRAVENOUS

## 2013-08-05 MED ORDER — FENTANYL CITRATE 0.05 MG/ML IJ SOLN
INTRAMUSCULAR | Status: AC
  Filled 2013-08-05: qty 5

## 2013-08-05 MED ORDER — ONDANSETRON HCL 4 MG/2ML IJ SOLN: 4.0000 mg | INTRAMUSCULAR | Status: DC | PRN

## 2013-08-05 MED ORDER — GLYCOPYRROLATE 0.2 MG/ML IJ SOLN
INTRAMUSCULAR | Status: AC
  Filled 2013-08-05: qty 3

## 2013-08-05 MED ORDER — PROMETHAZINE HCL 25 MG/ML IJ SOLN: 6.2500 mg | INTRAMUSCULAR | Status: DC | PRN

## 2013-08-05 MED ORDER — LIDOCAINE HCL 1 % IJ SOLN
INTRAMUSCULAR | Status: AC
  Filled 2013-08-05: qty 20

## 2013-08-05 MED ORDER — MORPHINE SULFATE 4 MG/ML IJ SOLN
INTRAMUSCULAR | Status: AC
  Filled 2013-08-05: qty 1

## 2013-08-05 MED ORDER — FAMOTIDINE 20 MG PO TABS
20.0000 mg | ORAL_TABLET | Freq: Every day | ORAL | Status: DC
  Administered 2013-08-05 – 2013-08-09 (×5): 20 mg via ORAL
  Filled 2013-08-05 (×5): qty 1

## 2013-08-05 MED ORDER — ROCURONIUM BROMIDE 100 MG/10ML IV SOLN
INTRAVENOUS | Status: AC
  Filled 2013-08-05: qty 1

## 2013-08-05 MED ORDER — MIDAZOLAM HCL 2 MG/2ML IJ SOLN
INTRAMUSCULAR | Status: AC
  Filled 2013-08-05: qty 6

## 2013-08-05 MED ORDER — LIDOCAINE HCL (CARDIAC) 20 MG/ML IV SOLN
INTRAVENOUS | Status: DC | PRN
  Administered 2013-08-05: 100 mg via INTRAVENOUS

## 2013-08-05 MED ORDER — DEXAMETHASONE SODIUM PHOSPHATE 10 MG/ML IJ SOLN
INTRAMUSCULAR | Status: DC | PRN
  Administered 2013-08-05: 10 mg via INTRAVENOUS

## 2013-08-05 MED ORDER — FENTANYL CITRATE 0.05 MG/ML IJ SOLN
INTRAMUSCULAR | Status: AC | PRN
  Administered 2013-08-05: 25 ug via INTRAVENOUS
  Administered 2013-08-05: 50 ug via INTRAVENOUS

## 2013-08-05 MED ORDER — SUCCINYLCHOLINE CHLORIDE 20 MG/ML IJ SOLN
INTRAMUSCULAR | Status: DC | PRN
  Administered 2013-08-05: 100 mg via INTRAVENOUS

## 2013-08-05 MED ORDER — OXYCODONE HCL 5 MG/5ML PO SOLN
5.0000 mg | Freq: Once | ORAL | Status: DC | PRN
  Filled 2013-08-05: qty 5

## 2013-08-05 MED ORDER — CARVEDILOL 12.5 MG PO TABS
12.5000 mg | ORAL_TABLET | Freq: Two times a day (BID) | ORAL | Status: DC
  Administered 2013-08-05 – 2013-08-09 (×8): 12.5 mg via ORAL
  Filled 2013-08-05 (×10): qty 1

## 2013-08-05 MED ORDER — IOHEXOL 300 MG/ML  SOLN
INTRAMUSCULAR | Status: DC | PRN
  Administered 2013-08-05: 30 mL

## 2013-08-05 MED ORDER — HYDROCODONE-ACETAMINOPHEN 5-325 MG PO TABS
1.0000 | ORAL_TABLET | ORAL | Status: DC | PRN
  Administered 2013-08-08 – 2013-08-09 (×3): 1 via ORAL
  Filled 2013-08-05 (×3): qty 1

## 2013-08-05 MED ORDER — GLYCOPYRROLATE 0.2 MG/ML IJ SOLN
INTRAMUSCULAR | Status: DC | PRN
  Administered 2013-08-05: 0.6 mg via INTRAVENOUS

## 2013-08-05 MED ORDER — EPHEDRINE SULFATE 50 MG/ML IJ SOLN
INTRAMUSCULAR | Status: DC | PRN
  Administered 2013-08-05 (×2): 10 mg via INTRAVENOUS

## 2013-08-05 MED ORDER — CYCLOBENZAPRINE HCL 10 MG PO TABS
10.0000 mg | ORAL_TABLET | Freq: Three times a day (TID) | ORAL | Status: DC | PRN
  Administered 2013-08-05 – 2013-08-06 (×4): 10 mg via ORAL
  Filled 2013-08-05 (×5): qty 1

## 2013-08-05 MED ORDER — ONDANSETRON HCL 4 MG/2ML IJ SOLN
INTRAMUSCULAR | Status: DC | PRN
  Administered 2013-08-05: 4 mg via INTRAVENOUS

## 2013-08-05 MED ORDER — TIOTROPIUM BROMIDE MONOHYDRATE 18 MCG IN CAPS
18.0000 ug | ORAL_CAPSULE | Freq: Every day | RESPIRATORY_TRACT | Status: DC
  Administered 2013-08-06 – 2013-08-09 (×3): 18 ug via RESPIRATORY_TRACT
  Filled 2013-08-05: qty 5

## 2013-08-05 MED ORDER — NEOSTIGMINE METHYLSULFATE 10 MG/10ML IV SOLN
INTRAVENOUS | Status: DC | PRN
  Administered 2013-08-05: 5 mg via INTRAVENOUS

## 2013-08-05 MED ORDER — MORPHINE SULFATE 2 MG/ML IJ SOLN
2.0000 mg | INTRAMUSCULAR | Status: DC | PRN
Start: 2013-08-05 — End: 2013-08-05

## 2013-08-05 MED ORDER — CIPROFLOXACIN IN D5W 400 MG/200ML IV SOLN: 400.0000 mg | INTRAVENOUS | Status: DC

## 2013-08-05 MED ORDER — STERILE WATER FOR IRRIGATION IR SOLN
Status: DC | PRN
  Administered 2013-08-05: 5 mL

## 2013-08-05 SURGICAL SUPPLY — 45 items
BAG URINE DRAINAGE (UROLOGICAL SUPPLIES) ×3 IMPLANT
BAG URO CATCHER STRL LF (DRAPE) ×3 IMPLANT
BASKET ZERO TIP NITINOL 2.4FR (BASKET) IMPLANT
BENZOIN TINCTURE PRP APPL 2/3 (GAUZE/BANDAGES/DRESSINGS) ×6 IMPLANT
CATH FOLEY 2W COUNCIL 20FR 5CC (CATHETERS) IMPLANT
CATH FOLEY 2W COUNCIL 5CC 18FR (CATHETERS) ×3 IMPLANT
CATH ROBINSON RED A/P 20FR (CATHETERS) IMPLANT
CATH X-FORCE N30 NEPHROSTOMY (TUBING) ×3 IMPLANT
COVER SURGICAL LIGHT HANDLE (MISCELLANEOUS) ×3 IMPLANT
DRAPE C-ARM 42X120 X-RAY (DRAPES) ×3 IMPLANT
DRAPE CAMERA CLOSED 9X96 (DRAPES) ×3 IMPLANT
DRAPE LINGEMAN PERC (DRAPES) ×3 IMPLANT
DRAPE SURG IRRIG POUCH 19X23 (DRAPES) ×3 IMPLANT
DRSG TEGADERM 8X12 (GAUZE/BANDAGES/DRESSINGS) ×6 IMPLANT
FIBER LASER FLEXIVA 550 (UROLOGICAL SUPPLIES) IMPLANT
GLOVE BIOGEL M STRL SZ7.5 (GLOVE) ×3 IMPLANT
GOWN STRL REUS W/TWL XL LVL3 (GOWN DISPOSABLE) ×6 IMPLANT
GUIDEWIRE AMPLAZ .035X145 (WIRE) ×6 IMPLANT
GUIDEWIRE STR DUAL SENSOR (WIRE) ×3 IMPLANT
KIT BASIN OR (CUSTOM PROCEDURE TRAY) ×3 IMPLANT
LASER FIBER DISP 1000U (UROLOGICAL SUPPLIES) IMPLANT
MANIFOLD NEPTUNE II (INSTRUMENTS) ×3 IMPLANT
NS IRRIG 1000ML POUR BTL (IV SOLUTION) ×3 IMPLANT
PACK BASIC VI WITH GOWN DISP (CUSTOM PROCEDURE TRAY) ×3 IMPLANT
PACK CYSTO (CUSTOM PROCEDURE TRAY) ×3 IMPLANT
PAD ABD 7.5X8 STRL (GAUZE/BANDAGES/DRESSINGS) ×3 IMPLANT
POSITIONER SURGICAL ARM (MISCELLANEOUS) ×6 IMPLANT
PROBE LITHOCLAST ULTRA 3.8X403 (UROLOGICAL SUPPLIES) ×3 IMPLANT
PROBE PNEUMATIC 1.0MMX570MM (UROLOGICAL SUPPLIES) ×3 IMPLANT
SET IRRIG Y TYPE TUR BLADDER L (SET/KITS/TRAYS/PACK) ×3 IMPLANT
SET WARMING FLUID IRRIGATION (MISCELLANEOUS) ×3 IMPLANT
SHEATH PEELAWAY SET 9 (SHEATH) ×3 IMPLANT
SPONGE GAUZE 4X4 12PLY (GAUZE/BANDAGES/DRESSINGS) IMPLANT
SPONGE LAP 4X18 X RAY DECT (DISPOSABLE) ×3 IMPLANT
STONE CATCHER W/TUBE ADAPTER (UROLOGICAL SUPPLIES) ×6 IMPLANT
SUT SILK 2 0 30  PSL (SUTURE)
SUT SILK 2 0 30 PSL (SUTURE) IMPLANT
SYR 20CC LL (SYRINGE) ×6 IMPLANT
SYRINGE 10CC LL (SYRINGE) ×3 IMPLANT
TOWEL OR 17X26 10 PK STRL BLUE (TOWEL DISPOSABLE) ×3 IMPLANT
TOWEL OR NON WOVEN STRL DISP B (DISPOSABLE) ×3 IMPLANT
TRAY FOLEY CATH 14FRSI W/METER (CATHETERS) IMPLANT
TRAY FOLEY CATH 16FRSI W/METER (SET/KITS/TRAYS/PACK) ×3 IMPLANT
TUBING CONNECTING 10 (TUBING) ×6 IMPLANT
WATER STERILE IRR 1500ML POUR (IV SOLUTION) IMPLANT

## 2013-08-05 NOTE — Interval H&P Note (Signed)
History and Physical Interval Note:  08/05/2013 11:10 AM  Erin Lopez  has presented today for surgery, with the diagnosis of LEFT RENAL CALCULUS  The various methods of treatment have been discussed with the patient and family. After consideration of risks, benefits and other options for treatment, the patient has consented to  Procedure(s): NEPHROLITHOTOMY PERCUTANEOUS (Left) HOLMIUM LASER APPLICATION (Left) as a surgical intervention .  The patient's history has been reviewed, patient examined, no change in status, stable for surgery.  I have reviewed the patient's chart and labs.  Questions were answered to the patient's satisfaction.     [name]

## 2013-08-05 NOTE — Anesthesia Preprocedure Evaluation (Addendum)
Anesthesia Evaluation  Patient identified by MRN, date of birth, ID band Patient awake    Reviewed: Allergy & Precautions, H&P , NPO status , Patient's Chart, lab work & pertinent test results, reviewed documented beta blocker date and time   Airway Mallampati: II TM Distance: >3 FB Neck ROM: Full    Dental  (+) Dental Advisory Given   Pulmonary neg pulmonary ROS, shortness of breath and with exertion, COPDformer smoker,  breath sounds clear to auscultation        Cardiovascular hypertension, Pt. on medications and Pt. on home beta blockers Rhythm:Regular Rate:Normal     Neuro/Psych PSYCHIATRIC DISORDERS Anxiety TIAnegative psych ROS   GI/Hepatic Neg liver ROS, GERD-  Medicated,  Endo/Other  negative endocrine ROS  Renal/GU Renal disease     Musculoskeletal  (+) Fibromyalgia -  Abdominal   Peds  Hematology negative hematology ROS (+)   Anesthesia Other Findings   Reproductive/Obstetrics negative OB ROS                          Anesthesia Physical Anesthesia Plan  ASA: III  Anesthesia Plan: General   Post-op Pain Management:    Induction: Intravenous  Airway Management Planned: Oral ETT  Additional Equipment:   Intra-op Plan:   Post-operative Plan: Extubation in OR  Informed Consent: I have reviewed the patients History and Physical, chart, labs and discussed the procedure including the risks, benefits and alternatives for the proposed anesthesia with the patient or authorized representative who has indicated his/her understanding and acceptance.   Dental advisory given  Plan Discussed with: CRNA  Anesthesia Plan Comments:         Anesthesia Quick Evaluation

## 2013-08-05 NOTE — Op Note (Signed)
Preoperative diagnosis: Left renal calculus Postoperative diagnosis: Same  Procedure: Left percutaneous nephrolithotomy   Surgeon: [name] M.D.  Anesthesia: Gen.  Indications: Patient presented as a referral with a dense 1.5x2.5 cm stone in her left renal pelvis. There were some mild dilation but no evidence of high-grade obstruction. We discussed with her the pros and cons of proceeding with elective treatment. Given the size and density the stone we did not feel that this was amenable to ESWL or ureteroscopy. We felt the stone would be best treated with a percutaneous approach. Risks benefits discussed with her.     Technique and findings: Patient initially presented to interventional radiology where she had placement of a left nephrostomy tube without incidence or complication. The patient subsequently went to the operating room she had successful induction of general endotracheal anesthesia. She was carefully placed in the prone position after placement of a Foley catheter. All extremities were carefully padded. Patient received perioperative antibiotics. Appropriate surgical timeout was performed. With the initial help from interventional radiology we converted the nephrostomy tube today access sheath with a safety wire down to the bladder. A large stone was encountered in the renal pelvis.  The combination hydraulic and ultrasonic lithotrite was used to fracture the stone into approximately 20-25 pieces. These were then removed with a 2 prong grasper. No large fragments appeared to be remaining. A 18 [place] [employer] Foley catheter was placed as a nephrostomy tube. Contrast could be seen going down the ureter. There did not appear to be evidence of marked gross extravasation. No obvious complications or problems occurred. Patient was brought to recovery room stable condition.

## 2013-08-05 NOTE — Transfer of Care (Signed)
Immediate Anesthesia Transfer of Care Note  Patient: Erin Lopez  Procedure(s) Performed: Procedure(s) (LRB): NEPHROLITHOTOMY PERCUTANEOUS (Left)  Patient Location: PACU  Anesthesia Type: General  Level of Consciousness: sedated, patient cooperative and responds to stimulation  Airway & Oxygen Therapy: Patient Spontanous Breathing and Patient connected to face mask oxgen  Post-op Assessment: Report given to PACU RN and Post -op Vital signs reviewed and stable  Post vital signs: Reviewed and stable  Complications: No apparent anesthesia complications

## 2013-08-05 NOTE — H&P (Signed)
Erin Lopez is an 63 y.o. female.   Chief Complaint: left renal stone HPI: Patient with history of large left renal calculus presents today for left percutaneous nephrostomy prior to planned nephrolithotomy .                                                                                                                                                                                                                                                               Past Medical History  Diagnosis Date  . Hypertension   . Bipolar 1 disorder     sees a psychiatrist  . Anxiety   . Hay fever   . Diverticular disease of colon     + 'itis  . Nephrolithiasis     Dense 1.5 x 2.5 cm left renal pelvis stone (Dr. Risa Grill to do percutaneous nephrectomy as per 06/21/13 consult visit)  . Hyperlipidemia     no meds in > a decade per pt  . COPD (chronic obstructive pulmonary disease)   . Adenomatous colon polyp 2010    1 adenomatous and multiple hyperplastic (recall 5 yrs)  . Fibromyalgia   . Alcoholism in remission     states last alcohol 11 yrs ago  . History of tobacco abuse   . History of abnormal cervical Pap smear     Cryo and LEEP age 48 and 16.  . Vitamin D deficiency 05/2011  . Osteopenia 05/2010    repeat bone densitometry 3-5 yrs  . Memory loss     Abnl MRI brain and CT brain c/w chronic microvascular ischemia  . History of Helicobacter pylori infection     +gastric biopsy (gastritis but no metaplasia, dysplasia, or malignancy identified)  . Environmental allergies   . Shortness of breath   . GERD (gastroesophageal reflux disease)   . Basal cell carcinoma of nasal sidewall     Past Surgical History  Procedure Laterality Date  . Cervical spine surgery       x 3 (in the 2000's)  . Total knee arthroplasty  2003  . Cesarean section  1994  . Tonsillectomy and adenoidectomy  1975  . Colonoscopy w/ polypectomy  05/2008    recall 5 yrs  . Esophagogastroduodenoscopy  2010    Candidal  esophagitis by gross  appearance on EGD, Esoph brushings neg for Candida or malignant cells.    Family History  Problem Relation Age of Onset  . Alcohol abuse Mother   . Arthritis Mother   . Hypertension Mother   . Hyperlipidemia Mother   . Diabetes Mother   . Cancer Father     colon  . Prostate cancer Father   . Hypertension Father   . Hyperlipidemia Father   . Diabetes Father    Social History:  reports that she has quit smoking. She has never used smokeless tobacco. She reports that she does not drink alcohol or use illicit drugs.  Allergies:  Allergies  Allergen Reactions  . Ceftin [Cefuroxime Axetil] Swelling    Swelling of eyes and tongue  . Prednisone Other (See Comments)    Hyperactivity    Medications Prior to Admission  Medication Sig Dispense Refill  . albuterol (PROVENTIL HFA) 108 (90 BASE) MCG/ACT inhaler Inhale 2 puffs into the lungs every 4 (four) hours as needed for wheezing or shortness of breath.  1 Inhaler  1  . ALPRAZolam (XANAX) 1 MG tablet Take 1 mg by mouth 3 (three) times daily.      Marland Kitchen amoxicillin (AMOXIL) 500 MG capsule Take 500 mg by mouth 2 (two) times daily.      . carvedilol (COREG) 12.5 MG tablet Take 12.5 mg by mouth 2 (two) times daily with a meal.      . cetirizine (ZYRTEC) 10 MG tablet Take 10 mg by mouth daily.      . cyclobenzaprine (FLEXERIL) 10 MG tablet Take 10 mg by mouth 3 (three) times daily as needed for muscle spasms.      Marland Kitchen lamoTRIgine (LAMICTAL) 100 MG tablet Take 300 mg by mouth at bedtime.       Marland Kitchen lisinopril (PRINIVIL,ZESTRIL) 10 MG tablet Take 10 mg by mouth at bedtime.      . magnesium hydroxide (MILK OF MAGNESIA) 400 MG/5ML suspension Take by mouth daily as needed for mild constipation.      . montelukast (SINGULAIR) 10 MG tablet Take 10 mg by mouth at bedtime.      Marland Kitchen oxyCODONE (ROXICODONE) 15 MG immediate release tablet Take 15 mg by mouth 3 (three) times daily.      . polyethylene glycol powder (GLYCOLAX/MIRALAX) powder 1  capful once daily for constipation  3350 g  3  . ranitidine (ZANTAC) 150 MG capsule Take 150 mg by mouth 2 (two) times daily.      Marland Kitchen tiotropium (SPIRIVA HANDIHALER) 18 MCG inhalation capsule Place 1 capsule (18 mcg total) into inhaler and inhale daily.  30 capsule  12  . venlafaxine XR (EFFEXOR-XR) 150 MG 24 hr capsule Take 150 mg by mouth daily with breakfast.      . zolpidem (AMBIEN) 10 MG tablet Take 10 mg by mouth at bedtime.        Results for orders placed during the hospital encounter of 08/05/13 (from the past 48 hour(s))  APTT     Status: None   Collection Time    08/05/13  8:20 AM      Result Value Ref Range   aPTT 36  24 - 37 seconds  BASIC METABOLIC PANEL     Status: Abnormal   Collection Time    08/05/13  8:20 AM      Result Value Ref Range   Sodium 138  137 - 147 mEq/L   Potassium 4.4  3.7 - 5.3 mEq/L   Chloride 103  96 - 112 mEq/L   CO2 24  19 - 32 mEq/L   Glucose, Bld 107 (*) 70 - 99 mg/dL   BUN 16  6 - 23 mg/dL   Creatinine, Ser 0.83  0.50 - 1.10 mg/dL   Calcium 9.0  8.4 - 10.5 mg/dL   GFR calc non Af Amer 75 (*) >90 mL/min   GFR calc Af Amer 87 (*) >90 mL/min   Comment: (NOTE)     The eGFR has been calculated using the CKD EPI equation.     This calculation has not been validated in all clinical situations.     eGFR's persistently <90 mL/min signify possible Chronic Kidney     Disease.  CBC WITH DIFFERENTIAL     Status: Abnormal   Collection Time    08/05/13  8:20 AM      Result Value Ref Range   WBC 4.6  4.0 - 10.5 K/uL   RBC 4.01  3.87 - 5.11 MIL/uL   Hemoglobin 12.0  12.0 - 15.0 g/dL   HCT 34.3 (*) 36.0 - 46.0 %   MCV 85.5  78.0 - 100.0 fL   MCH 29.9  26.0 - 34.0 pg   MCHC 35.0  30.0 - 36.0 g/dL   RDW 12.7  11.5 - 15.5 %   Platelets 209  150 - 400 K/uL   Neutrophils Relative % 53  43 - 77 %   Neutro Abs 2.4  1.7 - 7.7 K/uL   Lymphocytes Relative 33  12 - 46 %   Lymphs Abs 1.5  0.7 - 4.0 K/uL   Monocytes Relative 10  3 - 12 %   Monocytes Absolute  0.5  0.1 - 1.0 K/uL   Eosinophils Relative 4  0 - 5 %   Eosinophils Absolute 0.2  0.0 - 0.7 K/uL   Basophils Relative 1  0 - 1 %   Basophils Absolute 0.0  0.0 - 0.1 K/uL  PROTIME-INR     Status: None   Collection Time    08/05/13  8:20 AM      Result Value Ref Range   Prothrombin Time 13.3  11.6 - 15.2 seconds   INR 1.03  0.00 - 1.49  TYPE AND SCREEN     Status: None   Collection Time    08/05/13  8:20 AM      Result Value Ref Range   ABO/RH(D) O POS     Antibody Screen NEG     Sample Expiration 08/08/2013     No results found.  Review of Systems  Constitutional: Negative for fever and chills.  Respiratory: Negative for hemoptysis.        Occ cough and dyspnea  Cardiovascular: Negative for chest pain.  Gastrointestinal: Negative for nausea, vomiting, abdominal pain and blood in stool.  Genitourinary: Negative for dysuria, hematuria and flank pain.  Musculoskeletal: Negative for back pain.  Neurological: Negative for headaches.  Endo/Heme/Allergies: Does not bruise/bleed easily.  Vitals: BP 127/80  Temp 97.8  P 80  R 16  O2 SATS 90% RA Physical Exam  Constitutional: She is oriented to person, place, and time. She appears well-developed and well-nourished.  Cardiovascular: Normal rate and regular rhythm.   Respiratory: Effort normal.  Few bibasilar crackles  GI: Soft. Bowel sounds are normal. There is no tenderness.  Musculoskeletal: Normal range of motion. She exhibits no edema.  Neurological: She is alert and oriented to person, place, and time.     Assessment/Plan Pt with hx of  large left renal stone; plan is for left percutaneous nephrostomy prior to planned nephrolithotomy today. Details/risks of procedure d/w pt with her understanding and consent.  Crissie Sickles Rosy Estabrook 08/05/2013, 9:13 AM

## 2013-08-05 NOTE — Interval H&P Note (Signed)
History and Physical Interval Note:  08/05/2013 11:10 AM  Erin Lopez  has presented today for surgery, with the diagnosis of LEFT RENAL CALCULUS  The various methods of treatment have been discussed with the patient and family. After consideration of risks, benefits and other options for treatment, the patient has consented to  Procedure(s): NEPHROLITHOTOMY PERCUTANEOUS (Left) HOLMIUM LASER APPLICATION (Left) as a surgical intervention .  The patient's history has been reviewed, patient examined, no change in status, stable for surgery.  I have reviewed the patient's chart and labs.  Questions were answered to the patient's satisfaction.     Vania Rosero S Lenox Ladouceur   

## 2013-08-05 NOTE — Progress Notes (Signed)
Pt belongings delivered to patient and daughter Theodis Sato in room 1407.  Garry Heater, RN 08/05/2013

## 2013-08-05 NOTE — Procedures (Signed)
LEFT percutaneous nephroureteral catheter under fluoro No complication No blood loss. See complete dictation in [facility].

## 2013-08-05 NOTE — Anesthesia Postprocedure Evaluation (Signed)
Anesthesia Post Note  Patient: Erin Lopez  Procedure(s) Performed: Procedure(s) (LRB): NEPHROLITHOTOMY PERCUTANEOUS (Left)  Anesthesia type: General  Patient location: PACU  Post pain: Pain level controlled  Post assessment: Post-op Vital signs reviewed  Last Vitals: BP 146/70  Pulse 78  Temp(Src) 36.6 C  Resp 14  SpO2 97%  Post vital signs: Reviewed  Level of consciousness: sedated  Complications: No apparent anesthesia complications

## 2013-08-06 ENCOUNTER — Observation Stay (HOSPITAL_COMMUNITY): Payer: Medicare Other

## 2013-08-06 ENCOUNTER — Encounter (HOSPITAL_COMMUNITY): Payer: Self-pay | Admitting: Urology

## 2013-08-06 LAB — BASIC METABOLIC PANEL
BUN: 13 mg/dL (ref 6–23)
CALCIUM: 8.7 mg/dL (ref 8.4–10.5)
CO2: 24 meq/L (ref 19–32)
CREATININE: 1.07 mg/dL (ref 0.50–1.10)
Chloride: 101 mEq/L (ref 96–112)
GFR calc Af Amer: 64 mL/min — ABNORMAL LOW (ref >90)
GFR, EST NON AFRICAN AMERICAN: 55 mL/min — AB (ref >90)
Glucose, Bld: 221 mg/dL — ABNORMAL HIGH (ref 70–99)
Potassium: 4.2 mEq/L (ref 3.7–5.3)
SODIUM: 135 meq/L — AB (ref 137–147)

## 2013-08-06 LAB — ABO/RH: ABO/RH(D): O POS

## 2013-08-06 LAB — HEMOGLOBIN AND HEMATOCRIT, BLOOD
HEMATOCRIT: 32.8 % — AB (ref 36.0–46.0)
Hemoglobin: 11.4 g/dL — ABNORMAL LOW (ref 12.0–15.0)

## 2013-08-06 MED ORDER — IOHEXOL 300 MG/ML  SOLN
20.0000 mL | Freq: Once | INTRAMUSCULAR | Status: AC | PRN
  Administered 2013-08-06: 1 mL

## 2013-08-06 MED ORDER — FENTANYL CITRATE 0.05 MG/ML IJ SOLN
INTRAMUSCULAR | Status: AC
  Filled 2013-08-06: qty 6

## 2013-08-06 MED ORDER — CIPROFLOXACIN IN D5W 400 MG/200ML IV SOLN
INTRAVENOUS | Status: AC
Start: 2013-08-06 — End: 2013-08-06
  Filled 2013-08-06: qty 200

## 2013-08-06 MED ORDER — CIPROFLOXACIN IN D5W 400 MG/200ML IV SOLN: 400.0000 mg | INTRAVENOUS | Status: DC

## 2013-08-06 MED ORDER — LIDOCAINE HCL 1 % IJ SOLN
INTRAMUSCULAR | Status: AC
  Filled 2013-08-06: qty 20

## 2013-08-06 MED ORDER — MIDAZOLAM HCL 2 MG/2ML IJ SOLN
INTRAMUSCULAR | Status: AC
Start: 2013-08-06 — End: 2013-08-06
  Filled 2013-08-06: qty 6

## 2013-08-06 MED ORDER — MIDAZOLAM HCL 2 MG/2ML IJ SOLN
INTRAMUSCULAR | Status: AC | PRN
  Administered 2013-08-06 (×3): 1 mg via INTRAVENOUS

## 2013-08-06 MED ORDER — CIPROFLOXACIN IN D5W 400 MG/200ML IV SOLN
400.0000 mg | Freq: Once | INTRAVENOUS | Status: AC
  Administered 2013-08-06: 400 mg via INTRAVENOUS
  Filled 2013-08-06: qty 200

## 2013-08-06 MED ORDER — FENTANYL CITRATE 0.05 MG/ML IJ SOLN
INTRAMUSCULAR | Status: AC | PRN
  Administered 2013-08-06 (×2): 50 ug via INTRAVENOUS

## 2013-08-06 NOTE — Care Management Note (Addendum)
    Page 1 of 1   08/08/2013     3:02:48 PM CARE MANAGEMENT NOTE 08/08/2013  Patient:  Erin Lopez, Erin Lopez   Account Number:  [id number]  Date Initiated:  08/06/2013  Documentation initiated by:  [facility]  Subjective/Objective Assessment:   63 Y/O F ADMITTED W/RENAL CALCULUS.     Action/Plan:   FROM HOME.HAS PCP,PHARMACY.   Anticipated DC Date:  08/07/2013   Anticipated DC Plan:  [place]  CM consult      Choice offered to / List presented to:             Status of service:  In process, will continue to follow Medicare Important Message given?  YES (If response is "NO", the following Medicare IM given date fields will be blank) Date Medicare IM given:   Date Additional Medicare IM given:  08/08/2013  Discharge Disposition:    Per UR Regulation:  Reviewed for med. necessity/level of care/duration of stay  If discussed at [place] of Stay Meetings, dates discussed:    Comments:  08/06/13 KATHY MAHABIR RN,BSN NCM [phone] L NEPHROSTOMY.PAIN CONTROL ISSUES.

## 2013-08-06 NOTE — Progress Notes (Signed)
UR Completed.  Erin Lopez [name] [phone] 08/06/2013  

## 2013-08-06 NOTE — Progress Notes (Signed)
Agree.  For left ureteral stent placement and nephrostomy exchange today.

## 2013-08-06 NOTE — Procedures (Signed)
Procedure:  Left nephrostogram and left ureteral stent placement Findings:  Nephrostogram shows irregular collecting system without overt extravasation.  Ureter open.  24 cm, 8 Fr JJ stent placed from renal collecting system to bladder.  Could not get PCN to form in renal collecting system.  Perc access removed.

## 2013-08-06 NOTE — Progress Notes (Signed)
1 Day Post-Op  Subjective: Pt s/p left PCN/PCNL 5/4 secondary to large left renal stone; still with left flank/left lat abd discomfort  Objective: Vital signs in last 24 hours: Temp:  [97.5 F (36.4 C)-98.3 F (36.8 C)] 98.1 F (36.7 C) (05/05 0557) Pulse Rate:  [63-108] 108 (05/05 0557) Resp:  [11-20] 19 (05/05 0557) BP: (119-165)/(60-87) 158/86 mmHg (05/05 0557) SpO2:  [94 %-100 %] 96 % (05/05 0758) Weight:  [162 lb 14.7 oz (73.9 kg)] 162 lb 14.7 oz (73.9 kg) (05/04 1415) Last BM Date: 08/05/13  Intake/Output from previous day: 05/04 0701 - 05/05 0700 In: 2011.3 [P.O.:240; I.V.:1771.3] Out: 2655 [Urine:2655] Intake/Output this shift: Total I/O In: 732.5 [I.V.:732.5] Out: 860 [Urine:860]  Left red robin tube in place draining yellow urine ; bulky gauze dressing intact, site mod tender to palpation; pt awake/alert; chest- sl dim BS bases; heart- RRR; abd- soft,+BS, mild- mod tender left lat abd region; ext- FROM  Lab Results:   Recent Labs  08/05/13 0820 08/05/13 1325 08/06/13 0402  WBC 4.6  --   --   HGB 12.0 10.4* 11.4*  HCT 34.3* 31.1* 32.8*  PLT 209  --   --    BMET  Recent Labs  08/05/13 1325 08/06/13 0402  NA 142 135*  K 4.2 4.2  CL 107 101  CO2 30 24  GLUCOSE 102* 221*  BUN 15 13  CREATININE 0.90 1.07  CALCIUM 8.1* 8.7   PT/INR  Recent Labs  08/05/13 0820  LABPROT 13.3  INR 1.03   ABG No results found for this basename: PHART, PCO2, PO2, HCO3,  in the last 72 hours  Studies/Results: Ct Abdomen Wo Contrast  08/06/2013   CLINICAL DATA:  History of left renal calculus status post percutaneous nephrostomy and left nephrolithotomy. Abdominal pain and hematuria.  EXAM: CT ABDOMEN WITHOUT CONTRAST  TECHNIQUE: Multidetector CT imaging of the abdomen was performed following the standard protocol without IV contrast.  COMPARISON:  IR DIL URETER*L* dated 08/05/2013; CT ABD W/CM dated 06/14/2013  FINDINGS: Linear bilateral lower lobe atelectasis or scarring  noted. Areas of mild bronchiectasis are noted. Trace pleural fluid or thickening.  Vertebral body hemangiomas incidentally noted T9 and T10. Trace ascites is present. Interval left subcostal approach percutaneous nephrostomy tube is now in place with persistent mild left hydronephrosis. The previously seen left renal calculus is no longer discretely identifiable, and multiple radiopaque presumed stone fragments are identified within the left intrarenal collecting system/ renal pelvis as well as apparently outside the renal collecting system in the region of the left renal hilum for example image 28 and 27. Moderate left perinephric fluid is identified. This measures low fluid density. Unenhanced abdominal viscera otherwise unchanged. No free air visualized. Moderate atheromatous aortic calcification reidentified without visualized aneurysm.  IMPRESSION: Interval placement of left subcostal approach percutaneous nephrostomy with apparent fragmentation of the previously seen dominant left renal pelvis calculus.  Moderate left perinephric low-density fluid and presumed stone fragments outside the renal collecting system are identified which may indicate forniceal rupture or sequela of instrumentation with perforation and perinephric urinoma formation.  These results will be called to the ordering clinician or representative by the Radiologist Assistant, and communication documented in the PACS Dashboard.   Electronically Signed   By: Conchita Paris M.D.   On: 08/06/2013 09:03   Ir Dil Ureter Left  08/05/2013   CLINICAL DATA:  Left nephrolithiasis  EXAM: DILATATION OF NEPHROSTOMY TRACT  TECHNIQUE: The procedure was performed in the OR assisting Dr.  Grapey. Previously placed nephroureteral catheter was exchanged over an Amplatz wire for a 9 French peel-away sheath, through which a parallel Amplatz wire was placed as a safety wire. Over the working wire, the 10 mm balloon was advanced to dilate the parenchymal tract  and allow advancement of the 10 mm working sheath to allow on percutaneous nephrolithotomy by Dr. Risa Grill, reported separately. A series of fluoroscopic spot images document percutaneous nephrolithotomy. The final image documents placement of a percutaneous nephrostomy catheter. Several small residual calculi are noted in the mid and upper pole of the renal collecting system.  IMPRESSION: 1. Dilatation of the nephrostomy tract to facilitate nephrolithotomy by Dr. Risa Grill.   Electronically Signed   By: Arne Cleveland M.D.   On: 08/05/2013 14:40   Dg C-arm 61-120 Min-no Report  08/05/2013   CLINICAL DATA: Perc   C-ARM 61-120 MINUTES  Fluoroscopy was utilized by the requesting physician.  No radiographic  interpretation.    Ir Oris Drone Cath Perc Left  08/05/2013   CLINICAL DATA:  Symptomatic left renal calculus, pre nephrolithotomy  EXAM: LEFT PERCUTANEOUS NEPHROURETERAL CATHETER PLACEMENT UNDER ULTRASOUND AND FLUOROSCOPIC GUIDANCE  FLUOROSCOPY TIME:  5 min 30 seconds  TECHNIQUE: The procedure, risks (including but not limited to bleeding, infection, organ damage ), benefits, and alternatives were explained to the patient. Questions regarding the procedure were encouraged and answered. The patient understands and consents to the procedure.  The leftFlank region prepped with Betadine, draped in usual sterile fashion, infiltrated locally with 1% lidocaine.As antibiotic prophylaxis, Cipro 400mg  was ordered pre-procedure and administered intravenously within one hour of incision.  Intravenous Fentanyl and Versed were administered as conscious sedation during continuous cardiorespiratory monitoring by the radiology RN, with a total moderate sedation time of 20 minutes.  Under real-time fluoroscopic guidance, a 21-gauge trocar needle was advanced into a posterior lower pole calyx using the calculus as a guide. Needle was exchanged over a guidewire for transitional dilator. Through this, angled Glidewire was advanced  down the ureter. Contrast injection confirmed appropriate positioning. Catheter was exchanged over a guidewire for a 5 [place] Kumpe catheter, advanced into the urinary bladder. Catheter was capped and secured externally. No immediate complication.  IMPRESSION: 1. Technically successful left percutaneous nephroureteral catheter placement.   Electronically Signed   By: Arne Cleveland M.D.   On: 08/05/2013 10:53    Anti-infectives: Anti-infectives   Start     Dose/Rate Route Frequency Ordered Stop   08/05/13 2200  ciprofloxacin (CIPRO) IVPB 400 mg     400 mg 200 mL/hr over 60 Minutes Intravenous Every 12 hours 08/05/13 1541 08/05/13 2306   08/05/13 0800  ciprofloxacin (CIPRO) IVPB 400 mg     400 mg 200 mL/hr over 60 Minutes Intravenous On call 08/05/13 0750 08/05/13 1100   08/05/13 0752  ciprofloxacin (CIPRO) IVPB 400 mg  Status:  Discontinued     400 mg 200 mL/hr over 60 Minutes Intravenous 60 min pre-op 08/05/13 0752 08/05/13 0753      Assessment/Plan: s/p left PCN/PCNL 5/4 secondary to large left renal stone; f/u CT today shows moderate left perinephric low-density fluid and presumed stone  fragments outside the renal collecting system  which  may indicate forniceal rupture or sequela of instrumentation with  perforation and perinephric urinoma formation. Tent plan is for placement of a left ureteral stent/PCN exchange today. Details/risks of procedure d/w pt with her understanding and consent.    LOS: 1 day    D Rowe Robert 08/06/2013

## 2013-08-06 NOTE — Progress Notes (Signed)
Dressing saturated with serosanguinous drainage--changed dressing applied 4 X4s, ABD and 8 x8 tergaderm dressing. Nephrostomy intact with suture attached. Will continue to monitor the drainage. SRP, RN

## 2013-08-07 MED ORDER — HEPARIN SODIUM (PORCINE) 5000 UNIT/ML IJ SOLN
5000.0000 [IU] | Freq: Three times a day (TID) | INTRAMUSCULAR | Status: DC
  Administered 2013-08-07 – 2013-08-08 (×4): 5000 [IU] via SUBCUTANEOUS
  Filled 2013-08-07 (×6): qty 1

## 2013-08-07 MED ORDER — GENTAMICIN SULFATE 40 MG/ML IJ SOLN
400.0000 mg | Freq: Once | INTRAMUSCULAR | Status: AC
  Administered 2013-08-07: 400 mg via INTRAVENOUS
  Filled 2013-08-07: qty 10

## 2013-08-07 NOTE — Progress Notes (Signed)
2 Days Post-Op Subjective: Patient reports  moderate to significant ongoing left flank pain. She has not ambulated. She is status post removal of her nephrostomy tube with placement of a double-J stent by interventional radiology.  Objective: Vital signs in last 24 hours: Temp:  [98.2 F (36.8 C)-99.7 F (37.6 C)] 99.7 F (37.6 C) (05/06 0520) Pulse Rate:  [95-105] 105 (05/06 0520) Resp:  [12-19] 16 (05/06 0520) BP: (98-136)/(50-84) 131/65 mmHg (05/06 0520) SpO2:  [94 %-100 %] 95 % (05/06 0520)  Intake/Output from previous day: 05/05 0701 - 05/06 0700 In: 2408.8 [I.V.:2408.8] Out: 2210 [Urine:2210] Intake/Output this shift:    Physical Exam:  Constitutional: Vital signs reviewed. WD WN in NAD   Eyes: PERRL, No scleral icterus.   Cardiovascular: RRR Pulmonary/Chest: Normal effort Abdominal: Soft. Moderate left CVA tenderness dressing dry  Genitourinary: Foley indwelling Extremities: No cyanosis or edema   Lab Results:  Recent Labs  08/05/13 0820 08/05/13 1325 08/06/13 0402  HGB 12.0 10.4* 11.4*  HCT 34.3* 31.1* 32.8*   BMET  Recent Labs  08/05/13 1325 08/06/13 0402  NA 142 135*  K 4.2 4.2  CL 107 101  CO2 30 24  GLUCOSE 102* 221*  BUN 15 13  CREATININE 0.90 1.07  CALCIUM 8.1* 8.7    Recent Labs  08/05/13 0820  INR 1.03   No results found for this basename: LABURIN,  in the last 72 hours Results for orders placed during the hospital encounter of 06/30/09  SURGICAL PCR SCREEN     Status: Abnormal   Collection Time    06/24/09  2:29 PM      Result Value Ref Range Status   MRSA, PCR NEGATIVE  NEGATIVE Final   Staphylococcus aureus   (*) NEGATIVE Final   Value: POSITIVE            The Xpert SA Assay (FDA     approved for NASAL specimens     only), is one component of     a comprehensive surveillance     program.  It is not intended     to diagnose infection nor to     guide or monitor treatment.    Studies/Results: Ct Abdomen Wo  Contrast  08/06/2013   CLINICAL DATA:  History of left renal calculus status post percutaneous nephrostomy and left nephrolithotomy. Abdominal pain and hematuria.  EXAM: CT ABDOMEN WITHOUT CONTRAST  TECHNIQUE: Multidetector CT imaging of the abdomen was performed following the standard protocol without IV contrast.  COMPARISON:  IR DIL URETER*L* dated 08/05/2013; CT ABD W/CM dated 06/14/2013  FINDINGS: Linear bilateral lower lobe atelectasis or scarring noted. Areas of mild bronchiectasis are noted. Trace pleural fluid or thickening.  Vertebral body hemangiomas incidentally noted T9 and T10. Trace ascites is present. Interval left subcostal approach percutaneous nephrostomy tube is now in place with persistent mild left hydronephrosis. The previously seen left renal calculus is no longer discretely identifiable, and multiple radiopaque presumed stone fragments are identified within the left intrarenal collecting system/ renal pelvis as well as apparently outside the renal collecting system in the region of the left renal hilum for example image 28 and 27. Moderate left perinephric fluid is identified. This measures low fluid density. Unenhanced abdominal viscera otherwise unchanged. No free air visualized. Moderate atheromatous aortic calcification reidentified without visualized aneurysm.  IMPRESSION: Interval placement of left subcostal approach percutaneous nephrostomy with apparent fragmentation of the previously seen dominant left renal pelvis calculus.  Moderate left perinephric low-density fluid and presumed stone  fragments outside the renal collecting system are identified which may indicate forniceal rupture or sequela of instrumentation with perforation and perinephric urinoma formation.  These results will be called to the ordering clinician or representative by the Radiologist Assistant, and communication documented in the PACS Dashboard.   Electronically Signed   By: Conchita Paris M.D.   On: 08/06/2013  09:03   Ir Nephrostogram Left  08/06/2013   CLINICAL DATA:  Status post percutaneous nephrolithotomy yesterday to removed left-sided pelvic calculus. CT has demonstrated some probable urine leak around the kidney and request is been made to place a ureteral stent. A Council tip nephrostomy remains in place following the procedure.  EXAM: 1.  LEFT NEPHROSTOGRAM.  2.  LEFT URETERAL STENT PLACEMENT  COMPARISON:  IR INTRO URET CATH PERC *L* dated 08/05/2013; CT ABD W/CM dated 06/14/2013; CT ABDOMEN W/O CM dated 08/06/2013; IR DIL URETER*L* dated 08/05/2013  ANESTHESIA/SEDATION: Sedation:  3.0 Mg IV Versed; 100 mcg IV Fentanyl.  Total Moderate Sedation Time:  20 Minutes.  MEDICATIONS: 400 mg IV Cipro. Ciprofloxacin was given within two hours of incision.  CONTRAST:  20 ML OMNIPAQUE 300  FLUOROSCOPY TIME:  7 min and 42 seconds.  PROCEDURE: The procedure, risks, benefits, and alternatives were explained to the patient. Questions regarding the procedure were encouraged and answered. The patient understands and consents to the procedure.  The nephrostomy tube and surrounding skin was prepped with Betadine in a sterile fashion, and a sterile drape was applied covering the operative field. A sterile gown and sterile gloves were used for the procedure. Local anesthesia was provided with 1% Lidocaine.  The preexisting nephrostomy tube was injected with contrast material. Fluoroscopic spot images were obtained of the collecting system and ureter. Additional injection of the ureter was performed after removal of the nephrostomy tube over a guidewire and insertion of a 5-French catheter into the ureter.  A guidewire was advanced into the bladder. The wire was kinked to estimate appropriate length of ureteral stent. An 8-French by 24 cm ureteral stent was then advanced over an additional guidewire. The distal portion was formed at the level of the bladder. Proximal portion was then formed at the level of the renal collecting system. The  stent pusher was then injected with contrast material to evaluate drainage under fluoroscopy.  Attempt was made to advance a 16 French nephrostomy tube over a guidewire and into the collecting system. Ultimately, percutaneous access of the collecting system was removed. The access site was closed with subcutaneous 3-0 Monocryl and application of Steri-Strips.  COMPLICATIONS: None.  FINDINGS: Nephrostogram demonstrates irregularity of the collecting system without overt extravasation of contrast. The ureter is patent. A ureteral stent was placed which extends from the renal pelvis into the bladder. Contrast injection shows drainage down the stent and into the bladder.  A nephrostomy tube could not be advanced successfully into the renal pelvis due to irregularity small size of the pelvis. There was fear that advancing a nephrostomy tube would push the stent down the ureter. Percutaneous access was ultimately removed.  IMPRESSION: Successful placement of left ureteral stent. Prior to stent placement, nephrostogram demonstrates irregularity of the collecting system without overt extravasation of contrast. An 8-French by 24 cm ureteral stent was able be placed and showed appropriate drainage allowing removal of percutaneous access.   Electronically Signed   By: Aletta Edouard M.D.   On: 08/06/2013 16:59   Ir Dil Ureter Left  08/05/2013   CLINICAL DATA:  Left nephrolithiasis  EXAM:  DILATATION OF NEPHROSTOMY TRACT  TECHNIQUE: The procedure was performed in the OR assisting Dr. Risa Grill. Previously placed nephroureteral catheter was exchanged over an Amplatz wire for a 9 French peel-away sheath, through which a parallel Amplatz wire was placed as a safety wire. Over the working wire, the 10 mm balloon was advanced to dilate the parenchymal tract and allow advancement of the 10 mm working sheath to allow on percutaneous nephrolithotomy by Dr. Risa Grill, reported separately. A series of fluoroscopic spot images document  percutaneous nephrolithotomy. The final image documents placement of a percutaneous nephrostomy catheter. Several small residual calculi are noted in the mid and upper pole of the renal collecting system.  IMPRESSION: 1. Dilatation of the nephrostomy tract to facilitate nephrolithotomy by Dr. Risa Grill.   Electronically Signed   By: Arne Cleveland M.D.   On: 08/05/2013 14:40   Dg C-arm 61-120 Min-no Report  08/05/2013   CLINICAL DATA: Perc   C-ARM 61-120 MINUTES  Fluoroscopy was utilized by the requesting physician.  No radiographic  interpretation.    Ir Oris Drone Cath Perc Left  08/06/2013   CLINICAL DATA:  Status post percutaneous nephrolithotomy yesterday to removed left-sided pelvic calculus. CT has demonstrated some probable urine leak around the kidney and request is been made to place a ureteral stent. A Council tip nephrostomy remains in place following the procedure.  EXAM: 1.  LEFT NEPHROSTOGRAM.  2.  LEFT URETERAL STENT PLACEMENT  COMPARISON:  IR INTRO URET CATH PERC *L* dated 08/05/2013; CT ABD W/CM dated 06/14/2013; CT ABDOMEN W/O CM dated 08/06/2013; IR DIL URETER*L* dated 08/05/2013  ANESTHESIA/SEDATION: Sedation:  3.0 Mg IV Versed; 100 mcg IV Fentanyl.  Total Moderate Sedation Time:  20 Minutes.  MEDICATIONS: 400 mg IV Cipro. Ciprofloxacin was given within two hours of incision.  CONTRAST:  20 ML OMNIPAQUE 300  FLUOROSCOPY TIME:  7 min and 42 seconds.  PROCEDURE: The procedure, risks, benefits, and alternatives were explained to the patient. Questions regarding the procedure were encouraged and answered. The patient understands and consents to the procedure.  The nephrostomy tube and surrounding skin was prepped with Betadine in a sterile fashion, and a sterile drape was applied covering the operative field. A sterile gown and sterile gloves were used for the procedure. Local anesthesia was provided with 1% Lidocaine.  The preexisting nephrostomy tube was injected with contrast material. Fluoroscopic  spot images were obtained of the collecting system and ureter. Additional injection of the ureter was performed after removal of the nephrostomy tube over a guidewire and insertion of a 5-French catheter into the ureter.  A guidewire was advanced into the bladder. The wire was kinked to estimate appropriate length of ureteral stent. An 8-French by 24 cm ureteral stent was then advanced over an additional guidewire. The distal portion was formed at the level of the bladder. Proximal portion was then formed at the level of the renal collecting system. The stent pusher was then injected with contrast material to evaluate drainage under fluoroscopy.  Attempt was made to advance a 16 French nephrostomy tube over a guidewire and into the collecting system. Ultimately, percutaneous access of the collecting system was removed. The access site was closed with subcutaneous 3-0 Monocryl and application of Steri-Strips.  COMPLICATIONS: None.  FINDINGS: Nephrostogram demonstrates irregularity of the collecting system without overt extravasation of contrast. The ureter is patent. A ureteral stent was placed which extends from the renal pelvis into the bladder. Contrast injection shows drainage down the stent and into the bladder.  A nephrostomy tube could not be advanced successfully into the renal pelvis due to irregularity small size of the pelvis. There was fear that advancing a nephrostomy tube would push the stent down the ureter. Percutaneous access was ultimately removed.  IMPRESSION: Successful placement of left ureteral stent. Prior to stent placement, nephrostogram demonstrates irregularity of the collecting system without overt extravasation of contrast. An 8-French by 24 cm ureteral stent was able be placed and showed appropriate drainage allowing removal of percutaneous access.   Electronically Signed   By: Aletta Edouard M.D.   On: 08/06/2013 16:59   Ir Oris Drone Cath Perc Left  08/05/2013   CLINICAL DATA:   Symptomatic left renal calculus, pre nephrolithotomy  EXAM: LEFT PERCUTANEOUS NEPHROURETERAL CATHETER PLACEMENT UNDER ULTRASOUND AND FLUOROSCOPIC GUIDANCE  FLUOROSCOPY TIME:  5 min 30 seconds  TECHNIQUE: The procedure, risks (including but not limited to bleeding, infection, organ damage ), benefits, and alternatives were explained to the patient. Questions regarding the procedure were encouraged and answered. The patient understands and consents to the procedure.  The leftFlank region prepped with Betadine, draped in usual sterile fashion, infiltrated locally with 1% lidocaine.As antibiotic prophylaxis, Cipro 400mg  was ordered pre-procedure and administered intravenously within one hour of incision.  Intravenous Fentanyl and Versed were administered as conscious sedation during continuous cardiorespiratory monitoring by the radiology RN, with a total moderate sedation time of 20 minutes.  Under real-time fluoroscopic guidance, a 21-gauge trocar needle was advanced into a posterior lower pole calyx using the calculus as a guide. Needle was exchanged over a guidewire for transitional dilator. Through this, angled Glidewire was advanced down the ureter. Contrast injection confirmed appropriate positioning. Catheter was exchanged over a guidewire for a 5 [place] Kumpe catheter, advanced into the urinary bladder. Catheter was capped and secured externally. No immediate complication.  IMPRESSION: 1. Technically successful left percutaneous nephroureteral catheter placement.   Electronically Signed   By: Arne Cleveland M.D.   On: 08/05/2013 10:53    Assessment/Plan:   Postoperative day 2 status post percutaneous nephrolithotomy. She has had a major debulking of her stone and approximately 5% of the stone material remains mostly with 4-6 small 3-4 mm fragments. She did have evidence of an inflammatory process as well as a probable small urinoma around the kidney. She is now status post double-J stent placement with  removal of the nephrostomy tube. She currently is having ongoing discomfort enough that is keeping her from ambulating. She may require an additional day in the hospital to provide supportive care. Because of her decreased mobility will add subcutaneous heparin. Last nursing staff to remove Foley catheter and encourage ambulation with assistance today. We'll recheck basic labs in the morning and hopefully discharge tomorrow morning. Ultimate plan will be a second look via ureteroscopy with removal of her double-J stent in 2-3 weeks.   LOS: 2 days   [name] 08/07/2013, 9:25 AM

## 2013-08-07 NOTE — Progress Notes (Signed)
ANTIBIOTIC CONSULT NOTE - INITIAL  Pharmacy Consult for Gentamycin Indication: fever s/p stent placement  Allergies  Allergen Reactions  . Ceftin [Cefuroxime Axetil] Swelling    Swelling of eyes and tongue  . Prednisone Other (See Comments)    Hyperactivity   Patient Measurements: Height: 5\' 2"  (157.5 cm) Weight: 162 lb 14.7 oz (73.9 kg) IBW/kg (Calculated) : 50.1  Vital Signs: Temp: 99.7 F (37.6 C) (05/06 0520) Temp src: Oral (05/06 0520) BP: 131/65 mmHg (05/06 0520) Pulse Rate: 105 (05/06 0520) Intake/Output from previous day: 05/05 0701 - 05/06 0700 In: 2408.8 [I.V.:2408.8] Out: 2210 [Urine:2210] Intake/Output from this shift:   Labs:  Recent Labs  08/05/13 0820 08/05/13 1325 08/06/13 0402  WBC 4.6  --   --   HGB 12.0 10.4* 11.4*  PLT 209  --   --   CREATININE 0.83 0.90 1.07   Estimated Creatinine Clearance: 52.6 ml/min (by C-G formula based on Cr of 1.07). No results found for this basename: VANCOTROUGH, VANCOPEAK, VANCORANDOM, GENTTROUGH, GENTPEAK, GENTRANDOM, TOBRATROUGH, TOBRAPEAK, TOBRARND, AMIKACINPEAK, AMIKACINTROU, AMIKACIN,  in the last 72 hours   Microbiology: No results found for this or any previous visit (from the past 720 hour(s)).  Medical History: Past Medical History  Diagnosis Date  . Hypertension   . Bipolar 1 disorder     sees a psychiatrist  . Anxiety   . Hay fever   . Diverticular disease of colon     + 'itis  . Nephrolithiasis     Dense 1.5 x 2.5 cm left renal pelvis stone (Dr. Risa Grill to do percutaneous nephrectomy as per 06/21/13 consult visit)  . Hyperlipidemia     no meds in > a decade per pt  . COPD (chronic obstructive pulmonary disease)   . Adenomatous colon polyp 2010    1 adenomatous and multiple hyperplastic (recall 5 yrs)  . Fibromyalgia   . Alcoholism in remission     states last alcohol 11 yrs ago  . History of tobacco abuse   . History of abnormal cervical Pap smear     Cryo and LEEP age 42 and 85.  . Vitamin  D deficiency 05/2011  . Osteopenia 05/2010    repeat bone densitometry 3-5 yrs  . Memory loss     Abnl MRI brain and CT brain c/w chronic microvascular ischemia  . History of Helicobacter pylori infection     +gastric biopsy (gastritis but no metaplasia, dysplasia, or malignancy identified)  . Environmental allergies   . Shortness of breath   . GERD (gastroesophageal reflux disease)   . Basal cell carcinoma of nasal sidewall    Medications:  Anti-infectives   Start     Dose/Rate Route Frequency Ordered Stop   08/06/13 1500  ciprofloxacin (CIPRO) IVPB 400 mg     400 mg 200 mL/hr over 60 Minutes Intravenous  Once 08/06/13 1356 08/06/13 1457   08/06/13 1328  ciprofloxacin (CIPRO) 400 MG/200ML IVPB    Comments:  Margaretmary Dys   : cabinet override      08/06/13 1328 08/06/13 1401   08/06/13 1302  ciprofloxacin (CIPRO) IVPB 400 mg  Status:  Discontinued     400 mg 200 mL/hr over 60 Minutes Intravenous 60 min pre-op 08/06/13 1251 08/06/13 1419   08/05/13 2200  ciprofloxacin (CIPRO) IVPB 400 mg     400 mg 200 mL/hr over 60 Minutes Intravenous Every 12 hours 08/05/13 1541 08/05/13 2306   08/05/13 0800  ciprofloxacin (CIPRO) IVPB 400 mg  400 mg 200 mL/hr over 60 Minutes Intravenous On call 08/05/13 0750 08/05/13 1100   08/05/13 0752  ciprofloxacin (CIPRO) IVPB 400 mg  Status:  Discontinued     400 mg 200 mL/hr over 60 Minutes Intravenous 60 min pre-op 08/05/13 0752 08/05/13 0753     Assessment: Postoperative day 2 status post percutaneous nephrolithotomy with major debulking of her stone and approximately 5% of the stone material remains mostly with 4-6 small 3-4 mm fragments. She did have evidence of an inflammatory process as well as a probable small urinoma around the kidney. She is now status post double-J stent placement with removal of the nephrostomy tube. Fever noted, add Gentamycin per Pharmacy, likely discharge tomorrow, will order x 1 dose.  Gentamycin 5mg /kg x 1 dose  Goal  of Therapy:  Gentamicin trough level <2 mcg/ml  Plan:   Gentamycin 400mg  IV x 1 dose  Anticipate discharge home 5/7 per MD note, would need to order continued Gentamycin doses if patient not discharged  Consider trough level if Norva Karvonen continued  Minda Ditto PharmD Pager [phone] 08/07/2013, 12:32 PM   Minda Ditto 08/07/2013,12:25 PM

## 2013-08-07 NOTE — Progress Notes (Signed)
UR completed. Patient changed to inpatient- requiring IV antibiotics-temp 101.1

## 2013-08-08 LAB — BASIC METABOLIC PANEL
BUN: 12 mg/dL (ref 6–23)
CO2: 27 meq/L (ref 19–32)
Calcium: 7.8 mg/dL — ABNORMAL LOW (ref 8.4–10.5)
Chloride: 97 mEq/L (ref 96–112)
Creatinine, Ser: 1.27 mg/dL — ABNORMAL HIGH (ref 0.50–1.10)
GFR calc Af Amer: 52 mL/min — ABNORMAL LOW (ref >90)
GFR, EST NON AFRICAN AMERICAN: 45 mL/min — AB (ref >90)
GLUCOSE: 114 mg/dL — AB (ref 70–99)
POTASSIUM: 4.8 meq/L (ref 3.7–5.3)
Sodium: 131 mEq/L — ABNORMAL LOW (ref 137–147)

## 2013-08-08 LAB — URINE CULTURE
COLONY COUNT: NO GROWTH
Culture: NO GROWTH
Special Requests: NORMAL

## 2013-08-08 LAB — CBC
HEMATOCRIT: 25.3 % — AB (ref 36.0–46.0)
Hemoglobin: 8.5 g/dL — ABNORMAL LOW (ref 12.0–15.0)
MCH: 29.5 pg (ref 26.0–34.0)
MCHC: 33.6 g/dL (ref 30.0–36.0)
MCV: 87.8 fL (ref 78.0–100.0)
Platelets: 169 10*3/uL (ref 150–400)
RBC: 2.88 MIL/uL — AB (ref 3.87–5.11)
RDW: 13.3 % (ref 11.5–15.5)
WBC: 13 10*3/uL — ABNORMAL HIGH (ref 4.0–10.5)

## 2013-08-08 LAB — GENTAMICIN LEVEL, RANDOM: Gentamicin Rm: 1 ug/mL

## 2013-08-08 LAB — HEMOGLOBIN AND HEMATOCRIT, BLOOD
HCT: 23.7 % — ABNORMAL LOW (ref 36.0–46.0)
HEMOGLOBIN: 8 g/dL — AB (ref 12.0–15.0)

## 2013-08-08 MED ORDER — GENTAMICIN SULFATE 40 MG/ML IJ SOLN
300.0000 mg | Freq: Once | INTRAVENOUS | Status: AC
  Administered 2013-08-08: 300 mg via INTRAVENOUS
  Filled 2013-08-08: qty 7.5

## 2013-08-08 MED ORDER — BISACODYL 10 MG RE SUPP
10.0000 mg | Freq: Once | RECTAL | Status: AC
  Administered 2013-08-08: 10 mg via RECTAL
  Filled 2013-08-08: qty 1

## 2013-08-08 NOTE — Progress Notes (Signed)
3 Days Post-Op Subjective: Patient reports ongoing significant left-sided flank/abdominal discomfort. Complains of pain primarily with deep breaths or movement. She was able to sleep last night. She is taking by mouth fairly well. She has not had a bowel movement. Patient did develop a fever of 102.1 yesterday. Urine culture has been sent and she is been started on gentamicin. Urine culture remains pending. What blood cell count up slightly. There was no obvious evidence of urosepsis. Urine is the most likely source although she may be having a reactive fever to some hematoma/urinoma around the kidney as well as atelectasis due to decreased mobility.  Objective: Vital signs in last 24 hours: Temp:  [98.8 F (37.1 C)-102.1 F (38.9 C)] 99 F (37.2 C) (05/07 0525) Pulse Rate:  [100-106] 104 (05/07 0525) Resp:  [16-18] 18 (05/07 0525) BP: (127-140)/(55-63) 140/62 mmHg (05/07 0525) SpO2:  [94 %-98 %] 98 % (05/07 0525)  Intake/Output from previous day: 05/06 0701 - 05/07 0700 In: 240 [P.O.:240] Out: -  Intake/Output this shift:    Physical Exam:  Constitutional: Vital signs reviewed. WD WN in NAD   Eyes: PERRL, No scleral icterus.   Cardiovascular: RRR Pulmonary/Chest: Normal effort Abdominal: Soft. Moderate left upper quadrant CVA tenderness. Genitourinary: Not examined Extremities: No cyanosis or edema   Lab Results:  Recent Labs  08/05/13 1325 08/06/13 0402 08/08/13 0310  HGB 10.4* 11.4* 8.5*  HCT 31.1* 32.8* 25.3*   BMET  Recent Labs  08/06/13 0402 08/08/13 0310  NA 135* 131*  K 4.2 4.8  CL 101 97  CO2 24 27  GLUCOSE 221* 114*  BUN 13 12  CREATININE 1.07 1.27*  CALCIUM 8.7 7.8*    Recent Labs  08/05/13 0820  INR 1.03   No results found for this basename: LABURIN,  in the last 72 hours Results for orders placed during the hospital encounter of 06/30/09  SURGICAL PCR SCREEN     Status: Abnormal   Collection Time    06/24/09  2:29 PM      Result Value  Ref Range Status   MRSA, PCR NEGATIVE  NEGATIVE Final   Staphylococcus aureus   (*) NEGATIVE Final   Value: POSITIVE            The Xpert SA Assay (FDA     approved for NASAL specimens     only), is one component of     a comprehensive surveillance     program.  It is not intended     to diagnose infection nor to     guide or monitor treatment.    Studies/Results: Ir Nephrostogram Left  08/06/2013   CLINICAL DATA:  Status post percutaneous nephrolithotomy yesterday to removed left-sided pelvic calculus. CT has demonstrated some probable urine leak around the kidney and request is been made to place a ureteral stent. A Council tip nephrostomy remains in place following the procedure.  EXAM: 1.  LEFT NEPHROSTOGRAM.  2.  LEFT URETERAL STENT PLACEMENT  COMPARISON:  IR INTRO URET CATH PERC *L* dated 08/05/2013; CT ABD W/CM dated 06/14/2013; CT ABDOMEN W/O CM dated 08/06/2013; IR DIL URETER*L* dated 08/05/2013  ANESTHESIA/SEDATION: Sedation:  3.0 Mg IV Versed; 100 mcg IV Fentanyl.  Total Moderate Sedation Time:  20 Minutes.  MEDICATIONS: 400 mg IV Cipro. Ciprofloxacin was given within two hours of incision.  CONTRAST:  20 ML OMNIPAQUE 300  FLUOROSCOPY TIME:  7 min and 42 seconds.  PROCEDURE: The procedure, risks, benefits, and alternatives were explained to the patient.  Questions regarding the procedure were encouraged and answered. The patient understands and consents to the procedure.  The nephrostomy tube and surrounding skin was prepped with Betadine in a sterile fashion, and a sterile drape was applied covering the operative field. A sterile gown and sterile gloves were used for the procedure. Local anesthesia was provided with 1% Lidocaine.  The preexisting nephrostomy tube was injected with contrast material. Fluoroscopic spot images were obtained of the collecting system and ureter. Additional injection of the ureter was performed after removal of the nephrostomy tube over a guidewire and insertion of a  5-French catheter into the ureter.  A guidewire was advanced into the bladder. The wire was kinked to estimate appropriate length of ureteral stent. An 8-French by 24 cm ureteral stent was then advanced over an additional guidewire. The distal portion was formed at the level of the bladder. Proximal portion was then formed at the level of the renal collecting system. The stent pusher was then injected with contrast material to evaluate drainage under fluoroscopy.  Attempt was made to advance a 16 French nephrostomy tube over a guidewire and into the collecting system. Ultimately, percutaneous access of the collecting system was removed. The access site was closed with subcutaneous 3-0 Monocryl and application of Steri-Strips.  COMPLICATIONS: None.  FINDINGS: Nephrostogram demonstrates irregularity of the collecting system without overt extravasation of contrast. The ureter is patent. A ureteral stent was placed which extends from the renal pelvis into the bladder. Contrast injection shows drainage down the stent and into the bladder.  A nephrostomy tube could not be advanced successfully into the renal pelvis due to irregularity small size of the pelvis. There was fear that advancing a nephrostomy tube would push the stent down the ureter. Percutaneous access was ultimately removed.  IMPRESSION: Successful placement of left ureteral stent. Prior to stent placement, nephrostogram demonstrates irregularity of the collecting system without overt extravasation of contrast. An 8-French by 24 cm ureteral stent was able be placed and showed appropriate drainage allowing removal of percutaneous access.   Electronically Signed   By: Aletta Edouard M.D.   On: 08/06/2013 16:59   Ir [facility]man Cater Cath Perc Left  08/06/2013   CLINICAL DATA:  Status post percutaneous nephrolithotomy yesterday to removed left-sided pelvic calculus. CT has demonstrated some probable urine leak around the kidney and request is been made to place a  ureteral stent. A Council tip nephrostomy remains in place following the procedure.  EXAM: 1.  LEFT NEPHROSTOGRAM.  2.  LEFT URETERAL STENT PLACEMENT  COMPARISON:  IR INTRO URET CATH PERC *L* dated 08/05/2013; CT ABD W/CM dated 06/14/2013; CT ABDOMEN W/O CM dated 08/06/2013; IR DIL URETER*L* dated 08/05/2013  ANESTHESIA/SEDATION: Sedation:  3.0 Mg IV Versed; 100 mcg IV Fentanyl.  Total Moderate Sedation Time:  20 Minutes.  MEDICATIONS: 400 mg IV Cipro. Ciprofloxacin was given within two hours of incision.  CONTRAST:  20 ML OMNIPAQUE 300  FLUOROSCOPY TIME:  7 min and 42 seconds.  PROCEDURE: The procedure, risks, benefits, and alternatives were explained to the patient. Questions regarding the procedure were encouraged and answered. The patient understands and consents to the procedure.  The nephrostomy tube and surrounding skin was prepped with Betadine in a sterile fashion, and a sterile drape was applied covering the operative field. A sterile gown and sterile gloves were used for the procedure. Local anesthesia was provided with 1% Lidocaine.  The preexisting nephrostomy tube was injected with contrast material. Fluoroscopic spot images were obtained of the  collecting system and ureter. Additional injection of the ureter was performed after removal of the nephrostomy tube over a guidewire and insertion of a 5-French catheter into the ureter.  A guidewire was advanced into the bladder. The wire was kinked to estimate appropriate length of ureteral stent. An 8-French by 24 cm ureteral stent was then advanced over an additional guidewire. The distal portion was formed at the level of the bladder. Proximal portion was then formed at the level of the renal collecting system. The stent pusher was then injected with contrast material to evaluate drainage under fluoroscopy.  Attempt was made to advance a 16 French nephrostomy tube over a guidewire and into the collecting system. Ultimately, percutaneous access of the collecting  system was removed. The access site was closed with subcutaneous 3-0 Monocryl and application of Steri-Strips.  COMPLICATIONS: None.  FINDINGS: Nephrostogram demonstrates irregularity of the collecting system without overt extravasation of contrast. The ureter is patent. A ureteral stent was placed which extends from the renal pelvis into the bladder. Contrast injection shows drainage down the stent and into the bladder.  A nephrostomy tube could not be advanced successfully into the renal pelvis due to irregularity small size of the pelvis. There was fear that advancing a nephrostomy tube would push the stent down the ureter. Percutaneous access was ultimately removed.  IMPRESSION: Successful placement of left ureteral stent. Prior to stent placement, nephrostogram demonstrates irregularity of the collecting system without overt extravasation of contrast. An 8-French by 24 cm ureteral stent was able be placed and showed appropriate drainage allowing removal of percutaneous access.   Electronically Signed   By: Aletta Edouard M.D.   On: 08/06/2013 16:59    Assessment/Plan:   Perinephric hematoma question urinoma status post percutaneous nephrolithotomy. She has had a drop in her hemoglobin presumptively secondary to some ongoing perinephric oozing as well as some dilution. We will recheck hemoglobin and be met in the morning and sooner if there is any significant change in vital signs. If she persists in having a fever, hemoglobin drops her creatinine increases further than repeat imaging will be necessary. We'll continue her on gentamicin per pharmacy dosing for now. With ongoing fever and pain will need to continue hospital admission for the time being.   LOS: 3 days   [name] 08/08/2013, 7:53 AM

## 2013-08-08 NOTE — Progress Notes (Addendum)
ANTIBIOTIC CONSULT NOTE - FOLLOW UP  Pharmacy Consult for gentamicin Indication: fever s/p stent placement  Allergies  Allergen Reactions  . Ceftin [Cefuroxime Axetil] Swelling    Swelling of eyes and tongue  . Prednisone Other (See Comments)    Hyperactivity    Patient Measurements: Height: 5\' 2"  (157.5 cm) Weight: 162 lb 14.7 oz (73.9 kg) IBW/kg (Calculated) : 50.1 Adjusted Body Weight: 60kg  Vital Signs: Temp: 99 F (37.2 C) (05/07 0525) Temp src: Oral (05/07 0525) BP: 123/49 mmHg (05/07 0800) Pulse Rate: 96 (05/07 0800) Intake/Output from previous day: 05/06 0701 - 05/07 0700 In: 240 [P.O.:240] Out: -  Intake/Output from this shift: Total I/O In: 120 [P.O.:120] Out: 600 [Urine:600]  Labs:  Recent Labs  08/05/13 1325 08/06/13 0402 08/08/13 0310  WBC  --   --  13.0*  HGB 10.4* 11.4* 8.5*  PLT  --   --  169  CREATININE 0.90 1.07 1.27*   Estimated Creatinine Clearance: 44.3 ml/min (by C-G formula based on Cr of 1.27). No results found for this basename: VANCOTROUGH, VANCOPEAK, VANCORANDOM, GENTTROUGH, GENTPEAK, GENTRANDOM, TOBRATROUGH, TOBRAPEAK, TOBRARND, AMIKACINPEAK, AMIKACINTROU, AMIKACIN,  in the last 72 hours   Microbiology: No results found for this or any previous visit (from the past 720 hour(s)).  Anti-infectives   Start     Dose/Rate Route Frequency Ordered Stop   08/07/13 1300  gentamicin (GARAMYCIN) 400 mg in dextrose 5 % 100 mL IVPB     400 mg 110 mL/hr over 60 Minutes Intravenous  Once 08/07/13 1233 08/07/13 1404   08/06/13 1500  ciprofloxacin (CIPRO) IVPB 400 mg     400 mg 200 mL/hr over 60 Minutes Intravenous  Once 08/06/13 1356 08/06/13 1457   08/06/13 1328  ciprofloxacin (CIPRO) 400 MG/200ML IVPB    Comments:  Erin Lopez   : cabinet override      08/06/13 1328 08/06/13 1401   08/06/13 1302  ciprofloxacin (CIPRO) IVPB 400 mg  Status:  Discontinued     400 mg 200 mL/hr over 60 Minutes Intravenous 60 min pre-op 08/06/13 1251 08/06/13  1419   08/05/13 2200  ciprofloxacin (CIPRO) IVPB 400 mg     400 mg 200 mL/hr over 60 Minutes Intravenous Every 12 hours 08/05/13 1541 08/05/13 2306   08/05/13 0800  ciprofloxacin (CIPRO) IVPB 400 mg     400 mg 200 mL/hr over 60 Minutes Intravenous On call 08/05/13 0750 08/05/13 1100   08/05/13 0752  ciprofloxacin (CIPRO) IVPB 400 mg  Status:  Discontinued     400 mg 200 mL/hr over 60 Minutes Intravenous 60 min pre-op 08/05/13 0752 08/05/13 0753      Assessment: 62 YOF status post percutaneous nephrolithotomy with major debulking of her stone and approximately 5% of the stone material remains mostly with 4-6 small 3-4 mm fragments. She did have evidence of an inflammatory process as well as a probable small urinoma around the kidney. She is now status post double-J stent placement with removal of the nephrostomy tube. Fever noted, add Gentamycin per Pharmacy.  Gentamicin ordered x 1 dose 5/6 for suspected discharge today (5/7)  5/6 >> Gentamicin >> 5/4 >> Cipro >>   Tmax: 99 WBCs: 13 Renal: SCr = 1.27 (trending up) - appears trend started 5/5 (prior to aminoglycoside dose, Current CrCl = 63ml/min  5/6 urine: collected after Cipro  Gentamicin random level: 5/7 at 13:37 = 40mcg/ml (Gentamicin 400mg  IV x1 5/6 at 13:00)  Goal of Therapy:  Gentamicin trough level <2 mcg/ml  Plan:  Due to jump in SCr, will check random gentamicin level to make sure drug being eliminated  Based on random level, give gentamicin 300mg  IV x 1 dose tonight (at 8pm) and check random level in am to continue to evaluate clearance  Follow-up renal function in am  Doreene Eland, PharmD, BCPS.   Pager: [phone]  08/08/2013,12:42 PM

## 2013-08-08 NOTE — Progress Notes (Signed)
Patient continue to refuse to walk in the hall but have been ambulating to the bathroom with a walker. Patient C/O left flank pain PRN morphine given because it was not time for the Vicodin, somewhat lethargic this afternoon but able to make needs known. asking for more  Morphine instead of Vicodin, encouraged patient to take more PO pain med to prepare her for discharge. Urine output still pinkish/redish. Will continue to encourage ambulation in the hall.

## 2013-08-09 LAB — BASIC METABOLIC PANEL
BUN: 9 mg/dL (ref 6–23)
CHLORIDE: 97 meq/L (ref 96–112)
CO2: 25 mEq/L (ref 19–32)
Calcium: 8.5 mg/dL (ref 8.4–10.5)
Creatinine, Ser: 1.13 mg/dL — ABNORMAL HIGH (ref 0.50–1.10)
GFR calc non Af Amer: 52 mL/min — ABNORMAL LOW (ref >90)
GFR, EST AFRICAN AMERICAN: 60 mL/min — AB (ref >90)
Glucose, Bld: 125 mg/dL — ABNORMAL HIGH (ref 70–99)
Potassium: 4.4 mEq/L (ref 3.7–5.3)
SODIUM: 131 meq/L — AB (ref 137–147)

## 2013-08-09 LAB — CBC
HCT: 24.7 % — ABNORMAL LOW (ref 36.0–46.0)
HEMOGLOBIN: 8.5 g/dL — AB (ref 12.0–15.0)
MCH: 29.4 pg (ref 26.0–34.0)
MCHC: 34.4 g/dL (ref 30.0–36.0)
MCV: 85.5 fL (ref 78.0–100.0)
Platelets: 178 10*3/uL (ref 150–400)
RBC: 2.89 MIL/uL — ABNORMAL LOW (ref 3.87–5.11)
RDW: 12.9 % (ref 11.5–15.5)
WBC: 11.2 10*3/uL — AB (ref 4.0–10.5)

## 2013-08-09 LAB — GENTAMICIN LEVEL, RANDOM: Gentamicin Rm: 3.1 ug/mL

## 2013-08-09 MED ORDER — SULFAMETHOXAZOLE-TMP DS 800-160 MG PO TABS: 1.0000 | ORAL_TABLET | Freq: Two times a day (BID) | ORAL | Status: DC

## 2013-08-09 MED ORDER — GENTAMICIN SULFATE 40 MG/ML IJ SOLN: 300.0000 mg | INTRAMUSCULAR | Status: DC

## 2013-08-09 NOTE — Progress Notes (Addendum)
ANTIBIOTIC CONSULT NOTE - FOLLOW UP  Pharmacy Consult for gentamicin Indication: fever s/p stent placement  Allergies  Allergen Reactions  . Ceftin [Cefuroxime Axetil] Swelling    Swelling of eyes and tongue  . Prednisone Other (See Comments)    Hyperactivity    Patient Measurements: Height: 5\' 2"  (157.5 cm) Weight: 162 lb 14.7 oz (73.9 kg) IBW/kg (Calculated) : 50.1 Adjusted Body Weight: 60kg  Vital Signs: Temp: 99.6 F (37.6 C) (05/08 0512) Temp src: Oral (05/08 0512) BP: 131/64 mmHg (05/08 0739) Pulse Rate: 99 (05/08 0739) Intake/Output from previous day: 05/07 0701 - 05/08 0700 In: 1680 [P.O.:480; I.V.:1200] Out: 2200 [Urine:2200] Intake/Output from this shift:    Labs:  Recent Labs  08/08/13 0310 08/08/13 1337 08/09/13 0445  WBC 13.0*  --  11.2*  HGB 8.5* 8.0* 8.5*  PLT 169  --  178  CREATININE 1.27*  --  1.13*   Estimated Creatinine Clearance: 49.8 ml/min (by C-G formula based on Cr of 1.13).  Recent Labs  08/08/13 1337 08/09/13 0445  GENTRANDOM 1.0 3.1     Microbiology: Recent Results (from the past 720 hour(s))  URINE CULTURE     Status: None   Collection Time    08/07/13 11:51 AM      Result Value Ref Range Status   Specimen Description URINE, CATHETERIZED   Final   Special Requests Normal   Final   Culture  Setup Time     Final   Value: 08/07/2013 17:18     Performed at [facility]     Final   Value: NO GROWTH     Performed at [facility]   Culture     Final   Value: NO GROWTH     Performed at [facility]   Report Status 08/08/2013 FINAL   Final    Anti-infectives   Start     Dose/Rate Route Frequency Ordered Stop   08/09/13 0000  sulfamethoxazole-trimethoprim (BACTRIM DS) 800-160 MG per tablet     1 tablet Oral 2 times daily 08/09/13 0841     08/08/13 2000  gentamicin (GARAMYCIN) 300 mg in dextrose 5 % 100 mL IVPB     300 mg 107.5 mL/hr over 60 Minutes Intravenous  Once 08/08/13 1509  08/08/13 2016   08/07/13 1300  gentamicin (GARAMYCIN) 400 mg in dextrose 5 % 100 mL IVPB     400 mg 110 mL/hr over 60 Minutes Intravenous  Once 08/07/13 1233 08/07/13 1404   08/06/13 1500  ciprofloxacin (CIPRO) IVPB 400 mg     400 mg 200 mL/hr over 60 Minutes Intravenous  Once 08/06/13 1356 08/06/13 1457   08/06/13 1328  ciprofloxacin (CIPRO) 400 MG/200ML IVPB    Comments:  Margaretmary Dys   : cabinet override      08/06/13 1328 08/06/13 1401   08/06/13 1302  ciprofloxacin (CIPRO) IVPB 400 mg  Status:  Discontinued     400 mg 200 mL/hr over 60 Minutes Intravenous 60 min pre-op 08/06/13 1251 08/06/13 1419   08/05/13 2200  ciprofloxacin (CIPRO) IVPB 400 mg     400 mg 200 mL/hr over 60 Minutes Intravenous Every 12 hours 08/05/13 1541 08/05/13 2306   08/05/13 0800  ciprofloxacin (CIPRO) IVPB 400 mg     400 mg 200 mL/hr over 60 Minutes Intravenous On call 08/05/13 0750 08/05/13 1100   08/05/13 0752  ciprofloxacin (CIPRO) IVPB 400 mg  Status:  Discontinued     400 mg 200  mL/hr over 60 Minutes Intravenous 60 min pre-op 08/05/13 0752 08/05/13 0753      Assessment: 62 YOF status post percutaneous nephrolithotomy with major debulking of her stone and approximately 5% of the stone material remains mostly with 4-6 small 3-4 mm fragments. She did have evidence of an inflammatory process as well as a probable small urinoma around the kidney. She is now status post double-J stent placement with removal of the nephrostomy tube. Fever noted, add Gentamycin per Pharmacy.  Gentamicin ordered x 1 dose 5/6 for suspected discharge today (5/7)  5/6 >> Gentamicin >> 5/4 >> Cipro >>   Tmax: 99.6 WBCs: 11.2 Renal: SCr = 1.13 (improved), current CrCl = 34ml/min  5/6 urine: NG (collected after Cipro)  Levels/doses 5/7 13:37 Gentamicin random level: 33mcg/ml (Gentamicin 400mg  IV x1 5/6 at 13:00) 5/8 04:45 gentamicin random level: 3.1 mcg/ml (gentamicin 300mg  x 1 given 5/7 at 19:16)  Goal of Therapy:   Gentamicin trough level <2 mcg/ml  Plan:   Based on random level and improved renal function, OK to start gentamicin 300mg  IV q24h  Likely discharge in next 24h per MD note  Monitor renal function, additional levels not needed based on current discharge plan  Doreene Eland, PharmD, BCPS.   Pager: [phone]  08/09/2013,8:52 AM   Updated during rounds that patient going home - will d/c gentamicin order for this evening  Doreene Eland, PharmD, BCPS.   Pager: [phone] 08/09/2013 .9:20 AM

## 2013-08-09 NOTE — Progress Notes (Signed)
Patient ambulated in hallway with rolling walker about 100 feet. Patient tolerated well. Patient very tearful and saying ,"I am so depressed! I am so depressed." Patient denied feelings of wanting to harm herself, but requested to speak with chaplain at this time. Chaplain paged per patient request. Will continue to monitor patient and offer emotional support. Pearline Cables Setzer

## 2013-08-09 NOTE — Discharge Instructions (Signed)
DISCHARGE INSTRUCTIONS FOR [facility]  MEDICATIONS:  1. DO NOT RESUME YOUR IBUPROFEN, or any other medicines like aspirin, motrin, excedrin, advil, aleve, vitamin E, fish oil as these can all cause bleeding x 10 days.  2. Resume all your other meds from home - except do not take any other pain meds that you may have at home. ACTIVITY 1. No strenuous activity, sexual activity, or lifting greater than 10 pounds for 3 weeks. 2. No driving while on narcotic pain medications 3. Drink plenty of water 4. Continue to walk at home - you can still get blood clots when you are at home, so keep active, but don't over do it. 5. May return to work in 1 week (but not heavy or strenuous activity).  BATHING 1. You can shower and we recommend daily showers.  Cover your wound with a dressing and remove the dressing immediately after the shower.  Do not submerge wound under water.  WOUND CARE Your wound will drain bloody fluid and may do so for 7-14 days. You have 2 options for dressings:  1. You may use kerlex (rolled up gauze) and tape to dress your wound.  If you choose this method, then change the dressing as it becomes soaked.  Change it at least once daily until it stops draining. 2. You may use and ostomy device.  This is a bag with an andhesive circle.  The circle has a hole in the middle of it and you cut the hole to the size needed to fit the wound.  This will collect the drainage in the bag and allow you to drain the bag as needed.  SIGNS/SYMPTOMS TO CALL: 1. Please call us if you have a fever greater than 101.5, uncontrolled nausea/vomiting, uncontrolled pain, dizziness, unable to urinate, bloody urine, chest pain, shortness of breath, leg swelling, leg pain, redness around wound, drainage from wound, or any other concerns or questions. 2. You can reach [place] at [phone]. FOLLOW-UP 1. We will make sure you have an appropriate follow-up and will call next week with details

## 2013-08-09 NOTE — Progress Notes (Signed)
4 Days Post-Op Subjective: Patient reports ongoing moderate flank and abdominal discomfort. There is been some improvement in the last 24 hours. She has remained afebrile with a MAXIMUM TEMPERATURE of approximately 99.6. Hemoglobin has stabilized. Creatinine is improved. Urine output remains excellent. Urine culture is showing no growth.  Objective: Vital signs in last 24 hours: Temp:  [98 F (36.7 C)-99.6 F (37.6 C)] 99.6 F (37.6 C) (05/08 0512) Pulse Rate:  [97-107] 99 (05/08 0739) Resp:  [18-19] 18 (05/08 0512) BP: (115-147)/(64-69) 131/64 mmHg (05/08 0739) SpO2:  [96 %-98 %] 98 % (05/08 0512)  Intake/Output from previous day: 05/07 0701 - 05/08 0700 In: 1680 [P.O.:480; I.V.:1200] Out: 2200 [Urine:2200] Intake/Output this shift:    Physical Exam:  Constitutional: Vital signs reviewed. WD WN in NAD   Eyes: PERRL, No scleral icterus.   Cardiovascular: RRR Pulmonary/Chest: Normal effort Abdominal: No significant change Genitourinary: Not examined Extremities: No cyanosis or edema   Lab Results:  Recent Labs  08/08/13 0310 08/08/13 1337 08/09/13 0445  HGB 8.5* 8.0* 8.5*  HCT 25.3* 23.7* 24.7*   BMET  Recent Labs  08/08/13 0310 08/09/13 0445  NA 131* 131*  K 4.8 4.4  CL 97 97  CO2 27 25  GLUCOSE 114* 125*  BUN 12 9  CREATININE 1.27* 1.13*  CALCIUM 7.8* 8.5   No results found for this basename: LABPT, INR,  in the last 72 hours No results found for this basename: LABURIN,  in the last 72 hours Results for orders placed during the hospital encounter of 08/05/13  URINE CULTURE     Status: None   Collection Time    08/07/13 11:51 AM      Result Value Ref Range Status   Specimen Description URINE, CATHETERIZED   Final   Special Requests Normal   Final   Culture  Setup Time     Final   Value: 08/07/2013 17:18     Performed at [facility]     Final   Value: NO GROWTH     Performed at [facility]   Culture     Final   Value: NO GROWTH     Performed at [facility]   Report Status 08/08/2013 FINAL   Final    Studies/Results: No results found.  Assessment/Plan:   Status post percutaneous nephrolithotomy with subsequent double-J stent placement. She did develop a fever postoperatively the urine culture is negative. White blood cell count is trending towards normal. She is now had no significant fever for greater than 24 hours. Pain control continues to be an issue. We will hopefully he will to discharge her in the next 24 hours.   LOS: 4 days   [name] 08/09/2013, 8:36 AM

## 2013-08-09 NOTE — Discharge Summary (Signed)
Patient ID: Erin Lopez MRN: [id number] DOB/AGE: 07/23/1950 63 y.o.  Admit date: 08/05/2013 Discharge date: 08/09/2013    Discharge Diagnoses:   Present on Admission:  . Renal calculus  Consults:  None    Discharge Medications:   Medication List    STOP taking these medications       amoxicillin 500 MG capsule  Commonly known as:  AMOXIL      TAKE these medications       albuterol 108 (90 BASE) MCG/ACT inhaler  Commonly known as:  PROVENTIL HFA  Inhale 2 puffs into the lungs every 4 (four) hours as needed for wheezing or shortness of breath.     ALPRAZolam 1 MG tablet  Commonly known as:  XANAX  Take 1 mg by mouth 3 (three) times daily.     carvedilol 12.5 MG tablet  Commonly known as:  COREG  Take 12.5 mg by mouth 2 (two) times daily with a meal.     cetirizine 10 MG tablet  Commonly known as:  ZYRTEC  Take 10 mg by mouth daily.     cyclobenzaprine 10 MG tablet  Commonly known as:  FLEXERIL  Take 10 mg by mouth 3 (three) times daily as needed for muscle spasms.     lamoTRIgine 100 MG tablet  Commonly known as:  LAMICTAL  Take 300 mg by mouth at bedtime.     lisinopril 10 MG tablet  Commonly known as:  PRINIVIL,ZESTRIL  Take 10 mg by mouth at bedtime.     magnesium hydroxide 400 MG/5ML suspension  Commonly known as:  MILK OF MAGNESIA  Take by mouth daily as needed for mild constipation.     montelukast 10 MG tablet  Commonly known as:  SINGULAIR  Take 10 mg by mouth at bedtime.     oxyCODONE 15 MG immediate release tablet  Commonly known as:  ROXICODONE  Take 15 mg by mouth 3 (three) times daily.     polyethylene glycol powder powder  Commonly known as:  GLYCOLAX/MIRALAX  1 capful once daily for constipation     ranitidine 150 MG capsule  Commonly known as:  ZANTAC  Take 150 mg by mouth 2 (two) times daily.     sulfamethoxazole-trimethoprim 800-160 MG per tablet  Commonly known as:  BACTRIM DS  Take 1 tablet by mouth 2 (two) times daily.      tiotropium 18 MCG inhalation capsule  Commonly known as:  SPIRIVA HANDIHALER  Place 1 capsule (18 mcg total) into inhaler and inhale daily.     venlafaxine XR 150 MG 24 hr capsule  Commonly known as:  EFFEXOR-XR  Take 150 mg by mouth daily with breakfast.     zolpidem 10 MG tablet  Commonly known as:  AMBIEN  Take 10 mg by mouth at bedtime.         Significant Diagnostic Studies:  Ct Abdomen Wo Contrast  08/06/2013   CLINICAL DATA:  History of left renal calculus status post percutaneous nephrostomy and left nephrolithotomy. Abdominal pain and hematuria.  EXAM: CT ABDOMEN WITHOUT CONTRAST  TECHNIQUE: Multidetector CT imaging of the abdomen was performed following the standard protocol without IV contrast.  COMPARISON:  IR DIL URETER*L* dated 08/05/2013; CT ABD W/CM dated 06/14/2013  FINDINGS: Linear bilateral lower lobe atelectasis or scarring noted. Areas of mild bronchiectasis are noted. Trace pleural fluid or thickening.  Vertebral body hemangiomas incidentally noted T9 and T10. Trace ascites is present. Interval left subcostal approach percutaneous nephrostomy tube is now  in place with persistent mild left hydronephrosis. The previously seen left renal calculus is no longer discretely identifiable, and multiple radiopaque presumed stone fragments are identified within the left intrarenal collecting system/ renal pelvis as well as apparently outside the renal collecting system in the region of the left renal hilum for example image 28 and 27. Moderate left perinephric fluid is identified. This measures low fluid density. Unenhanced abdominal viscera otherwise unchanged. No free air visualized. Moderate atheromatous aortic calcification reidentified without visualized aneurysm.  IMPRESSION: Interval placement of left subcostal approach percutaneous nephrostomy with apparent fragmentation of the previously seen dominant left renal pelvis calculus.  Moderate left perinephric low-density fluid and  presumed stone fragments outside the renal collecting system are identified which may indicate forniceal rupture or sequela of instrumentation with perforation and perinephric urinoma formation.  These results will be called to the ordering clinician or representative by the Radiologist Assistant, and communication documented in the PACS Dashboard.   Electronically Signed   By: Conchita Paris M.D.   On: 08/06/2013 09:03   Ir Nephrostogram Left  08/06/2013   CLINICAL DATA:  Status post percutaneous nephrolithotomy yesterday to removed left-sided pelvic calculus. CT has demonstrated some probable urine leak around the kidney and request is been made to place a ureteral stent. A Council tip nephrostomy remains in place following the procedure.  EXAM: 1.  LEFT NEPHROSTOGRAM.  2.  LEFT URETERAL STENT PLACEMENT  COMPARISON:  IR INTRO URET CATH PERC *L* dated 08/05/2013; CT ABD W/CM dated 06/14/2013; CT ABDOMEN W/O CM dated 08/06/2013; IR DIL URETER*L* dated 08/05/2013  ANESTHESIA/SEDATION: Sedation:  3.0 Mg IV Versed; 100 mcg IV Fentanyl.  Total Moderate Sedation Time:  20 Minutes.  MEDICATIONS: 400 mg IV Cipro. Ciprofloxacin was given within two hours of incision.  CONTRAST:  20 ML OMNIPAQUE 300  FLUOROSCOPY TIME:  7 min and 42 seconds.  PROCEDURE: The procedure, risks, benefits, and alternatives were explained to the patient. Questions regarding the procedure were encouraged and answered. The patient understands and consents to the procedure.  The nephrostomy tube and surrounding skin was prepped with Betadine in a sterile fashion, and a sterile drape was applied covering the operative field. A sterile gown and sterile gloves were used for the procedure. Local anesthesia was provided with 1% Lidocaine.  The preexisting nephrostomy tube was injected with contrast material. Fluoroscopic spot images were obtained of the collecting system and ureter. Additional injection of the ureter was performed after removal of the  nephrostomy tube over a guidewire and insertion of a 5-French catheter into the ureter.  A guidewire was advanced into the bladder. The wire was kinked to estimate appropriate length of ureteral stent. An 8-French by 24 cm ureteral stent was then advanced over an additional guidewire. The distal portion was formed at the level of the bladder. Proximal portion was then formed at the level of the renal collecting system. The stent pusher was then injected with contrast material to evaluate drainage under fluoroscopy.  Attempt was made to advance a 16 French nephrostomy tube over a guidewire and into the collecting system. Ultimately, percutaneous access of the collecting system was removed. The access site was closed with subcutaneous 3-0 Monocryl and application of Steri-Strips.  COMPLICATIONS: None.  FINDINGS: Nephrostogram demonstrates irregularity of the collecting system without overt extravasation of contrast. The ureter is patent. A ureteral stent was placed which extends from the renal pelvis into the bladder. Contrast injection shows drainage down the stent and into the bladder.  A  nephrostomy tube could not be advanced successfully into the renal pelvis due to irregularity small size of the pelvis. There was fear that advancing a nephrostomy tube would push the stent down the ureter. Percutaneous access was ultimately removed.  IMPRESSION: Successful placement of left ureteral stent. Prior to stent placement, nephrostogram demonstrates irregularity of the collecting system without overt extravasation of contrast. An 8-French by 24 cm ureteral stent was able be placed and showed appropriate drainage allowing removal of percutaneous access.   Electronically Signed   By: Aletta Edouard M.D.   On: 08/06/2013 16:59   Ir Dil Ureter Left  08/05/2013   CLINICAL DATA:  Left nephrolithiasis  EXAM: DILATATION OF NEPHROSTOMY TRACT  TECHNIQUE: The procedure was performed in the OR assisting Dr. Risa Grill. Previously  placed nephroureteral catheter was exchanged over an Amplatz wire for a 9 French peel-away sheath, through which a parallel Amplatz wire was placed as a safety wire. Over the working wire, the 10 mm balloon was advanced to dilate the parenchymal tract and allow advancement of the 10 mm working sheath to allow on percutaneous nephrolithotomy by Dr. Risa Grill, reported separately. A series of fluoroscopic spot images document percutaneous nephrolithotomy. The final image documents placement of a percutaneous nephrostomy catheter. Several small residual calculi are noted in the mid and upper pole of the renal collecting system.  IMPRESSION: 1. Dilatation of the nephrostomy tract to facilitate nephrolithotomy by Dr. Risa Grill.   Electronically Signed   By: Arne Cleveland M.D.   On: 08/05/2013 14:40   Dg C-arm 61-120 Min-no Report  08/05/2013   CLINICAL DATA: Perc   C-ARM 61-120 MINUTES  Fluoroscopy was utilized by the requesting physician.  No radiographic  interpretation.    Ir Oris Drone Cath Perc Left  08/06/2013   CLINICAL DATA:  Status post percutaneous nephrolithotomy yesterday to removed left-sided pelvic calculus. CT has demonstrated some probable urine leak around the kidney and request is been made to place a ureteral stent. A Council tip nephrostomy remains in place following the procedure.  EXAM: 1.  LEFT NEPHROSTOGRAM.  2.  LEFT URETERAL STENT PLACEMENT  COMPARISON:  IR INTRO URET CATH PERC *L* dated 08/05/2013; CT ABD W/CM dated 06/14/2013; CT ABDOMEN W/O CM dated 08/06/2013; IR DIL URETER*L* dated 08/05/2013  ANESTHESIA/SEDATION: Sedation:  3.0 Mg IV Versed; 100 mcg IV Fentanyl.  Total Moderate Sedation Time:  20 Minutes.  MEDICATIONS: 400 mg IV Cipro. Ciprofloxacin was given within two hours of incision.  CONTRAST:  20 ML OMNIPAQUE 300  FLUOROSCOPY TIME:  7 min and 42 seconds.  PROCEDURE: The procedure, risks, benefits, and alternatives were explained to the patient. Questions regarding the procedure were  encouraged and answered. The patient understands and consents to the procedure.  The nephrostomy tube and surrounding skin was prepped with Betadine in a sterile fashion, and a sterile drape was applied covering the operative field. A sterile gown and sterile gloves were used for the procedure. Local anesthesia was provided with 1% Lidocaine.  The preexisting nephrostomy tube was injected with contrast material. Fluoroscopic spot images were obtained of the collecting system and ureter. Additional injection of the ureter was performed after removal of the nephrostomy tube over a guidewire and insertion of a 5-French catheter into the ureter.  A guidewire was advanced into the bladder. The wire was kinked to estimate appropriate length of ureteral stent. An 8-French by 24 cm ureteral stent was then advanced over an additional guidewire. The distal portion was formed at the level of  the bladder. Proximal portion was then formed at the level of the renal collecting system. The stent pusher was then injected with contrast material to evaluate drainage under fluoroscopy.  Attempt was made to advance a 16 French nephrostomy tube over a guidewire and into the collecting system. Ultimately, percutaneous access of the collecting system was removed. The access site was closed with subcutaneous 3-0 Monocryl and application of Steri-Strips.  COMPLICATIONS: None.  FINDINGS: Nephrostogram demonstrates irregularity of the collecting system without overt extravasation of contrast. The ureter is patent. A ureteral stent was placed which extends from the renal pelvis into the bladder. Contrast injection shows drainage down the stent and into the bladder.  A nephrostomy tube could not be advanced successfully into the renal pelvis due to irregularity small size of the pelvis. There was fear that advancing a nephrostomy tube would push the stent down the ureter. Percutaneous access was ultimately removed.  IMPRESSION: Successful  placement of left ureteral stent. Prior to stent placement, nephrostogram demonstrates irregularity of the collecting system without overt extravasation of contrast. An 8-French by 24 cm ureteral stent was able be placed and showed appropriate drainage allowing removal of percutaneous access.   Electronically Signed   By: Aletta Edouard M.D.   On: 08/06/2013 16:59   Ir Oris Drone Cath Perc Left  08/05/2013   CLINICAL DATA:  Symptomatic left renal calculus, pre nephrolithotomy  EXAM: LEFT PERCUTANEOUS NEPHROURETERAL CATHETER PLACEMENT UNDER ULTRASOUND AND FLUOROSCOPIC GUIDANCE  FLUOROSCOPY TIME:  5 min 30 seconds  TECHNIQUE: The procedure, risks (including but not limited to bleeding, infection, organ damage ), benefits, and alternatives were explained to the patient. Questions regarding the procedure were encouraged and answered. The patient understands and consents to the procedure.  The leftFlank region prepped with Betadine, draped in usual sterile fashion, infiltrated locally with 1% lidocaine.As antibiotic prophylaxis, Cipro 400mg  was ordered pre-procedure and administered intravenously within one hour of incision.  Intravenous Fentanyl and Versed were administered as conscious sedation during continuous cardiorespiratory monitoring by the radiology RN, with a total moderate sedation time of 20 minutes.  Under real-time fluoroscopic guidance, a 21-gauge trocar needle was advanced into a posterior lower pole calyx using the calculus as a guide. Needle was exchanged over a guidewire for transitional dilator. Through this, angled Glidewire was advanced down the ureter. Contrast injection confirmed appropriate positioning. Catheter was exchanged over a guidewire for a 5 [place] Kumpe catheter, advanced into the urinary bladder. Catheter was capped and secured externally. No immediate complication.  IMPRESSION: 1. Technically successful left percutaneous nephroureteral catheter placement.   Electronically Signed    By: Arne Cleveland M.D.   On: 08/05/2013 10:53      Hospital Course:  Active Problems:   Renal calculus   On the day of admission the patient had a left nephrostomy tube placed by interventional radiology.  She was then taken to surgery and underwent percutaneous nephrolithotomy for a large very hard stone in her left renal pelvis.  At that time we felt a stone had been completely removed although we cannot rule out some residual fragments.  There were no obvious complications.  Nephrostomy tube was placed.  The patient remained stable.  Hemoglobin was reasonable on postoperative day 1.  Follow-up CT showed a number of small fragments in the collecting system as well as some perinephric inflammation.  The nephrostomy tube did appear to go down the proximal ureter.  We asked interventional radiology to go ahead and place a double-J stent through the  nephrostomy tube with plans on subtotally going in for a second load either via nephrostomy tube or ureteroscopically to remove residual fragments.  Interventional radiology was unable to safely place a nephrostomy tube and therefore we left her with a double-J stent.  Because the patient was slow to ambulate I began her on subcutaneous heparin.  She continued to complain of severe left-sided abdominal and flank pain.  She subsequently did become febrile.  She was put on gentamicin.  Urine culture turned out to be negative and she quickly defervesced.  She never displayed evidence of urosepsis.  Patient's hemoglobin subsequently fell from approximately 11-8.0.  We stopped her subcutaneous heparin.  Her hemoglobin improved the following morning to 8.5.  She remained hemodynamically stable.  Her pain was better and she remained afebrile.  She expressed a desire to go home.  Since we were not doing anything active other than managing her pain we elected to send her home with oral Septra double strength and close follow-up early next week for clinical  reassessment and hemoglobin check.  She is to re-presented to the emergency room or contact the physician on-call if she has problems over the weekend.  We will ultimately plan for definitive ureteroscopy with removal of her stent and attempt to remove the remaining small fragments into her 3 weeks. Day of Discharge BP 147/67  Pulse 106  Temp(Src) 99.6 F (37.6 C) (Oral)  Resp 18  Ht 5\' 2"  (1.575 m)  Wt 162 lb 14.7 oz (73.9 kg)  BMI 29.79 kg/m2  SpO2 98%  Results for orders placed during the hospital encounter of 08/05/13 (from the past 24 hour(s))  BASIC METABOLIC PANEL     Status: Abnormal   Collection Time    08/09/13  4:45 AM      Result Value Ref Range   Sodium 131 (*) 137 - 147 mEq/L   Potassium 4.4  3.7 - 5.3 mEq/L   Chloride 97  96 - 112 mEq/L   CO2 25  19 - 32 mEq/L   Glucose, Bld 125 (*) 70 - 99 mg/dL   BUN 9  6 - 23 mg/dL   Creatinine, Ser 1.13 (*) 0.50 - 1.10 mg/dL   Calcium 8.5  8.4 - 10.5 mg/dL   GFR calc non Af Amer 52 (*) >90 mL/min   GFR calc Af Amer 60 (*) >90 mL/min  CBC     Status: Abnormal   Collection Time    08/09/13  4:45 AM      Result Value Ref Range   WBC 11.2 (*) 4.0 - 10.5 K/uL   RBC 2.89 (*) 3.87 - 5.11 MIL/uL   Hemoglobin 8.5 (*) 12.0 - 15.0 g/dL   HCT 24.7 (*) 36.0 - 46.0 %   MCV 85.5  78.0 - 100.0 fL   MCH 29.4  26.0 - 34.0 pg   MCHC 34.4  30.0 - 36.0 g/dL   RDW 12.9  11.5 - 15.5 %   Platelets 178  150 - 400 K/uL  GENTAMICIN LEVEL, RANDOM     Status: None   Collection Time    08/09/13  4:45 AM      Result Value Ref Range   Gentamicin Rm 3.1

## 2013-08-10 ENCOUNTER — Emergency Department (HOSPITAL_COMMUNITY)
Admission: EM | Admit: 2013-08-10 | Discharge: 2013-08-10 | Disposition: A | Discharge status: Home / Self Care | Payer: Medicare Other | Attending: Emergency Medicine | Admitting: Emergency Medicine

## 2013-08-10 ENCOUNTER — Encounter (HOSPITAL_COMMUNITY): Payer: Self-pay | Admitting: Emergency Medicine

## 2013-08-10 DIAGNOSIS — Z792 Long term (current) use of antibiotics: Secondary | ICD-10-CM | POA: Insufficient documentation

## 2013-08-10 DIAGNOSIS — Z8619 Personal history of other infectious and parasitic diseases: Secondary | ICD-10-CM | POA: Insufficient documentation

## 2013-08-10 DIAGNOSIS — Z85828 Personal history of other malignant neoplasm of skin: Secondary | ICD-10-CM | POA: Insufficient documentation

## 2013-08-10 DIAGNOSIS — Z860100 Personal history of colon polyps, unspecified: Secondary | ICD-10-CM | POA: Insufficient documentation

## 2013-08-10 DIAGNOSIS — R110 Nausea: Secondary | ICD-10-CM | POA: Insufficient documentation

## 2013-08-10 DIAGNOSIS — K219 Gastro-esophageal reflux disease without esophagitis: Secondary | ICD-10-CM | POA: Insufficient documentation

## 2013-08-10 DIAGNOSIS — Y9389 Activity, other specified: Secondary | ICD-10-CM | POA: Insufficient documentation

## 2013-08-10 DIAGNOSIS — Z87891 Personal history of nicotine dependence: Secondary | ICD-10-CM | POA: Insufficient documentation

## 2013-08-10 DIAGNOSIS — Z8639 Personal history of other endocrine, nutritional and metabolic disease: Secondary | ICD-10-CM | POA: Insufficient documentation

## 2013-08-10 DIAGNOSIS — Y929 Unspecified place or not applicable: Secondary | ICD-10-CM | POA: Insufficient documentation

## 2013-08-10 DIAGNOSIS — R509 Fever, unspecified: Secondary | ICD-10-CM | POA: Insufficient documentation

## 2013-08-10 DIAGNOSIS — R42 Dizziness and giddiness: Secondary | ICD-10-CM | POA: Insufficient documentation

## 2013-08-10 DIAGNOSIS — Z79899 Other long term (current) drug therapy: Secondary | ICD-10-CM | POA: Insufficient documentation

## 2013-08-10 DIAGNOSIS — F411 Generalized anxiety disorder: Secondary | ICD-10-CM | POA: Insufficient documentation

## 2013-08-10 DIAGNOSIS — Z87448 Personal history of other diseases of urinary system: Secondary | ICD-10-CM | POA: Insufficient documentation

## 2013-08-10 DIAGNOSIS — T424X1A Poisoning by benzodiazepines, accidental (unintentional), initial encounter: Secondary | ICD-10-CM | POA: Insufficient documentation

## 2013-08-10 DIAGNOSIS — R5383 Other fatigue: Secondary | ICD-10-CM

## 2013-08-10 DIAGNOSIS — F319 Bipolar disorder, unspecified: Secondary | ICD-10-CM | POA: Insufficient documentation

## 2013-08-10 DIAGNOSIS — G8929 Other chronic pain: Secondary | ICD-10-CM | POA: Insufficient documentation

## 2013-08-10 DIAGNOSIS — T50901A Poisoning by unspecified drugs, medicaments and biological substances, accidental (unintentional), initial encounter: Secondary | ICD-10-CM

## 2013-08-10 DIAGNOSIS — J449 Chronic obstructive pulmonary disease, unspecified: Secondary | ICD-10-CM | POA: Insufficient documentation

## 2013-08-10 DIAGNOSIS — T400X1A Poisoning by opium, accidental (unintentional), initial encounter: Secondary | ICD-10-CM | POA: Insufficient documentation

## 2013-08-10 DIAGNOSIS — T424X4A Poisoning by benzodiazepines, undetermined, initial encounter: Secondary | ICD-10-CM | POA: Insufficient documentation

## 2013-08-10 DIAGNOSIS — I10 Essential (primary) hypertension: Secondary | ICD-10-CM | POA: Insufficient documentation

## 2013-08-10 DIAGNOSIS — J4489 Other specified chronic obstructive pulmonary disease: Secondary | ICD-10-CM | POA: Insufficient documentation

## 2013-08-10 DIAGNOSIS — Z8739 Personal history of other diseases of the musculoskeletal system and connective tissue: Secondary | ICD-10-CM | POA: Insufficient documentation

## 2013-08-10 DIAGNOSIS — R5381 Other malaise: Secondary | ICD-10-CM | POA: Insufficient documentation

## 2013-08-10 DIAGNOSIS — Z862 Personal history of diseases of the blood and blood-forming organs and certain disorders involving the immune mechanism: Secondary | ICD-10-CM | POA: Insufficient documentation

## 2013-08-10 LAB — URINALYSIS, ROUTINE W REFLEX MICROSCOPIC
Bilirubin Urine: NEGATIVE
Glucose, UA: NEGATIVE mg/dL
Ketones, ur: NEGATIVE mg/dL
NITRITE: NEGATIVE
PROTEIN: 30 mg/dL — AB
SPECIFIC GRAVITY, URINE: 1.011 (ref 1.005–1.030)
Urobilinogen, UA: 0.2 mg/dL (ref 0.0–1.0)
pH: 6.5 (ref 5.0–8.0)

## 2013-08-10 LAB — CBC WITH DIFFERENTIAL/PLATELET
BASOS ABS: 0 10*3/uL (ref 0.0–0.1)
Basophils Relative: 0 % (ref 0–1)
EOS PCT: 4 % (ref 0–5)
Eosinophils Absolute: 0.3 10*3/uL (ref 0.0–0.7)
HCT: 25.6 % — ABNORMAL LOW (ref 36.0–46.0)
Hemoglobin: 8.9 g/dL — ABNORMAL LOW (ref 12.0–15.0)
LYMPHS ABS: 1.5 10*3/uL (ref 0.7–4.0)
LYMPHS PCT: 18 % (ref 12–46)
MCH: 29.3 pg (ref 26.0–34.0)
MCHC: 34.8 g/dL (ref 30.0–36.0)
MCV: 84.2 fL (ref 78.0–100.0)
MONO ABS: 1.2 10*3/uL — AB (ref 0.1–1.0)
Monocytes Relative: 15 % — ABNORMAL HIGH (ref 3–12)
Neutro Abs: 5 10*3/uL (ref 1.7–7.7)
Neutrophils Relative %: 63 % (ref 43–77)
Platelets: 282 10*3/uL (ref 150–400)
RBC: 3.04 MIL/uL — ABNORMAL LOW (ref 3.87–5.11)
RDW: 12.8 % (ref 11.5–15.5)
WBC: 8 10*3/uL (ref 4.0–10.5)

## 2013-08-10 LAB — RAPID URINE DRUG SCREEN, HOSP PERFORMED
Amphetamines: NOT DETECTED
Barbiturates: NOT DETECTED
Benzodiazepines: POSITIVE — AB
Cocaine: NOT DETECTED
Opiates: POSITIVE — AB
TETRAHYDROCANNABINOL: NOT DETECTED

## 2013-08-10 LAB — URINE MICROSCOPIC-ADD ON

## 2013-08-10 LAB — COMPREHENSIVE METABOLIC PANEL
ALT: 15 U/L (ref 0–35)
AST: 15 U/L (ref 0–37)
Albumin: 3 g/dL — ABNORMAL LOW (ref 3.5–5.2)
Alkaline Phosphatase: 61 U/L (ref 39–117)
BUN: 12 mg/dL (ref 6–23)
CO2: 25 mEq/L (ref 19–32)
CREATININE: 1.18 mg/dL — AB (ref 0.50–1.10)
Calcium: 8.8 mg/dL (ref 8.4–10.5)
Chloride: 91 mEq/L — ABNORMAL LOW (ref 96–112)
GFR calc Af Amer: 57 mL/min — ABNORMAL LOW (ref >90)
GFR, EST NON AFRICAN AMERICAN: 49 mL/min — AB (ref >90)
GLUCOSE: 108 mg/dL — AB (ref 70–99)
Potassium: 4 mEq/L (ref 3.7–5.3)
Sodium: 129 mEq/L — ABNORMAL LOW (ref 137–147)
Total Bilirubin: 0.3 mg/dL (ref 0.3–1.2)
Total Protein: 6.4 g/dL (ref 6.0–8.3)

## 2013-08-10 LAB — ETHANOL: Alcohol, Ethyl (B): <11 mg/dL (ref 0–11)

## 2013-08-10 LAB — LIPASE, BLOOD: Lipase: 29 U/L (ref 11–59)

## 2013-08-10 MED ORDER — FENTANYL CITRATE 0.05 MG/ML IJ SOLN
100.0000 ug | Freq: Once | INTRAMUSCULAR | Status: AC
  Administered 2013-08-10: 100 ug via INTRAVENOUS
  Filled 2013-08-10: qty 2

## 2013-08-10 MED ORDER — ACETAMINOPHEN 10 MG/ML IV SOLN
1000.0000 mg | Freq: Four times a day (QID) | INTRAVENOUS | Status: DC
  Filled 2013-08-10 (×2): qty 100

## 2013-08-10 MED ORDER — ACETAMINOPHEN 10 MG/ML IV SOLN
1000.0000 mg | Freq: Once | INTRAVENOUS | Status: AC
  Administered 2013-08-10: 1000 mg via INTRAVENOUS
  Filled 2013-08-10: qty 100

## 2013-08-10 MED ORDER — SODIUM CHLORIDE 0.9 % IV BOLUS (SEPSIS)
2000.0000 mL | Freq: Once | INTRAVENOUS | Status: AC
  Administered 2013-08-10: 2000 mL via INTRAVENOUS

## 2013-08-10 NOTE — ED Notes (Signed)
Pt from home after L uretal stent placement c/o nausea and 10/10 L flank  pain that is not relieved with rx pain meds. Pt took oxycodone 1 hr PTA. Pt is A&O and in NAD

## 2013-08-10 NOTE — ED Provider Notes (Signed)
CSN: [id number]     Arrival date & time 08/10/13  1531 History   First MD Initiated Contact with Patient 08/10/13 1553     Chief Complaint  Patient presents with  . Abdominal Pain  . Fever     (Consider location/radiation/quality/duration/timing/severity/associated sxs/prior Treatment) HPI Complains of left flank pain onset approximately 1 week ago. Last bowel movement one week ago.  Maximum temperature was 101 on 08/07/2013. She's not measured her temperature since then. States pain is presently 9 or 10 on scale of 1-10. Not made better or worse by anything. Admits to nausea. Admits to lightheadedness. Her daughter reports that she falls asleep while eating No vomiting no other associated symptoms She's been treated with Bactrim DS for reported urinary tract infection. Past Medical History  Diagnosis Date  . Hypertension   . Bipolar 1 disorder     sees a psychiatrist  . Anxiety   . Hay fever   . Diverticular disease of colon     + 'itis  . Nephrolithiasis     Dense 1.5 x 2.5 cm left renal pelvis stone (Dr. Risa Grill to do percutaneous nephrectomy as per 06/21/13 consult visit)  . Hyperlipidemia     no meds in > a decade per pt  . COPD (chronic obstructive pulmonary disease)   . Adenomatous colon polyp 2010    1 adenomatous and multiple hyperplastic (recall 5 yrs)  . Fibromyalgia   . Alcoholism in remission     states last alcohol 11 yrs ago  . History of tobacco abuse   . History of abnormal cervical Pap smear     Cryo and LEEP age 69 and 76.  . Vitamin D deficiency 05/2011  . Osteopenia 05/2010    repeat bone densitometry 3-5 yrs  . Memory loss     Abnl MRI brain and CT brain c/w chronic microvascular ischemia  . History of Helicobacter pylori infection     +gastric biopsy (gastritis but no metaplasia, dysplasia, or malignancy identified)  . Environmental allergies   . Shortness of breath   . GERD (gastroesophageal reflux disease)   . Basal cell carcinoma of nasal sidewall     Past Surgical History  Procedure Laterality Date  . Cervical spine surgery       x 3 (in the 2000's)  . Total knee arthroplasty  2003  . Cesarean section  1994  . Tonsillectomy and adenoidectomy  1975  . Colonoscopy w/ polypectomy  05/2008    recall 5 yrs  . Esophagogastroduodenoscopy  2010    Candidal esophagitis by gross appearance on EGD, Esoph brushings neg for Candida or malignant cells.  . Nephrolithotomy Left 08/05/2013    Procedure: NEPHROLITHOTOMY PERCUTANEOUS;  Surgeon: [name], MD;  Location: [facility];  Service: Urology;  Laterality: Left;   Family History  Problem Relation Age of Onset  . Alcohol abuse Mother   . Arthritis Mother   . Hypertension Mother   . Hyperlipidemia Mother   . Diabetes Mother   . Cancer Father     colon  . Prostate cancer Father   . Hypertension Father   . Hyperlipidemia Father   . Diabetes Father    History  Substance Use Topics  . Smoking status: Former Research scientist (life sciences)  . Smokeless tobacco: Never Used  . Alcohol Use: No     Comment: none in 11 years   OB History   Grav Para Term Preterm Abortions TAB SAB Ect Mult Living  Review of Systems  Constitutional: Positive for fever and fatigue.  HENT: Negative.   Respiratory: Negative.   Cardiovascular: Negative.   Gastrointestinal: Positive for nausea.  Genitourinary: Positive for flank pain.  Musculoskeletal: Negative.   Skin: Negative.   Neurological: Positive for light-headedness.  Psychiatric/Behavioral: Negative.   All other systems reviewed and are negative.     Allergies  Ceftin and Prednisone  Home Medications   Prior to Admission medications   Medication Sig Start Date End Date Taking? Authorizing Provider  albuterol (PROVENTIL HFA) 108 (90 BASE) MCG/ACT inhaler Inhale 2 puffs into the lungs every 4 (four) hours as needed for wheezing or shortness of breath. 06/25/13  Yes Tammi Sou, MD  ALPRAZolam Duanne Moron) 1 MG tablet Take 1 mg by mouth 3 (three)  times daily.   Yes Historical Provider, MD  carvedilol (COREG) 12.5 MG tablet Take 12.5 mg by mouth 2 (two) times daily with a meal.   Yes Historical Provider, MD  cetirizine (ZYRTEC) 10 MG tablet Take 10 mg by mouth daily.   Yes Historical Provider, MD  cyclobenzaprine (FLEXERIL) 10 MG tablet Take 10 mg by mouth 3 (three) times daily as needed for muscle spasms.   Yes Historical Provider, MD  lamoTRIgine (LAMICTAL) 100 MG tablet Take 300 mg by mouth at bedtime.    Yes Historical Provider, MD  lisinopril (PRINIVIL,ZESTRIL) 10 MG tablet Take 10 mg by mouth at bedtime.   Yes Historical Provider, MD  magnesium hydroxide (MILK OF MAGNESIA) 400 MG/5ML suspension Take by mouth daily as needed for mild constipation.   Yes Historical Provider, MD  montelukast (SINGULAIR) 10 MG tablet Take 10 mg by mouth at bedtime.   Yes Historical Provider, MD  oxyCODONE (ROXICODONE) 15 MG immediate release tablet Take 15 mg by mouth 3 (three) times daily.   Yes Historical Provider, MD  polyethylene glycol powder (GLYCOLAX/MIRALAX) powder 1 capful once daily for constipation 07/29/13  Yes Tammi Sou, MD  ranitidine (ZANTAC) 150 MG capsule Take 150 mg by mouth 2 (two) times daily.   Yes Historical Provider, MD  sulfamethoxazole-trimethoprim (BACTRIM DS) 800-160 MG per tablet Take 1 tablet by mouth 2 (two) times daily. 08/09/13  Yes [name], MD  tiotropium (SPIRIVA HANDIHALER) 18 MCG inhalation capsule Place 1 capsule (18 mcg total) into inhaler and inhale daily. 06/25/13  Yes Tammi Sou, MD  venlafaxine XR (EFFEXOR-XR) 150 MG 24 hr capsule Take 150 mg by mouth daily with breakfast.   Yes Historical Provider, MD  zolpidem (AMBIEN) 10 MG tablet Take 10 mg by mouth at bedtime.   Yes Historical Provider, MD   BP 111/57  Pulse 92  Temp(Src) 98.2 F (36.8 C) (Oral)  Resp 18  SpO2 95% Physical Exam  Nursing note and vitals reviewed. Constitutional: She appears well-developed and well-nourished. She appears  distressed.  Mildly ill appearing  HENT:  Head: Normocephalic and atraumatic.  Mucous membranes dry  Eyes: Conjunctivae are normal. Pupils are equal, round, and reactive to light.  Neck: Neck supple. No tracheal deviation present. No thyromegaly present.  Cardiovascular: Normal rate and regular rhythm.   No murmur heard. Pulmonary/Chest: Effort normal and breath sounds normal.  Abdominal: Soft. Bowel sounds are normal. She exhibits no distension. There is no tenderness.  Genitourinary:  Left flank tenderness  Musculoskeletal: Normal range of motion. She exhibits no edema and no tenderness.  Neurological: Coordination normal.  Speech slightly slurred  Skin: Skin is warm and dry. No rash noted.  Psychiatric: She has  a normal mood and affect.    ED Course  Procedures (including critical care time) Labs Review Labs Reviewed  CBC WITH DIFFERENTIAL  COMPREHENSIVE METABOLIC PANEL  LIPASE, BLOOD  URINALYSIS, ROUTINE W REFLEX MICROSCOPIC    Imaging Review No results found.   EKG Interpretation None     1653 PM patient's pulse oximetry desaturated to 86% briefly after being treated with intravenous fentanyl.  She was placed on supple nausea. She was administered intravenous acetaminophen 1740 5 PM for continued flank pain. At 1915 p.m. she feels improved her and go home. She is alert ambulatory. Speech no longer slurred. Pulse ox no longer hypoxic On further history patient reveals that she's been taking oxycodone more frequently than prescribed pain. Results for orders placed during the hospital encounter of 08/10/13  CBC WITH DIFFERENTIAL      Result Value Ref Range   WBC 8.0  4.0 - 10.5 K/uL   RBC 3.04 (*) 3.87 - 5.11 MIL/uL   Hemoglobin 8.9 (*) 12.0 - 15.0 g/dL   HCT 25.6 (*) 36.0 - 46.0 %   MCV 84.2  78.0 - 100.0 fL   MCH 29.3  26.0 - 34.0 pg   MCHC 34.8  30.0 - 36.0 g/dL   RDW 12.8  11.5 - 15.5 %   Platelets 282  150 - 400 K/uL   Neutrophils Relative % 63  43 - 77 %    Neutro Abs 5.0  1.7 - 7.7 K/uL   Lymphocytes Relative 18  12 - 46 %   Lymphs Abs 1.5  0.7 - 4.0 K/uL   Monocytes Relative 15 (*) 3 - 12 %   Monocytes Absolute 1.2 (*) 0.1 - 1.0 K/uL   Eosinophils Relative 4  0 - 5 %   Eosinophils Absolute 0.3  0.0 - 0.7 K/uL   Basophils Relative 0  0 - 1 %   Basophils Absolute 0.0  0.0 - 0.1 K/uL  COMPREHENSIVE METABOLIC PANEL      Result Value Ref Range   Sodium 129 (*) 137 - 147 mEq/L   Potassium 4.0  3.7 - 5.3 mEq/L   Chloride 91 (*) 96 - 112 mEq/L   CO2 25  19 - 32 mEq/L   Glucose, Bld 108 (*) 70 - 99 mg/dL   BUN 12  6 - 23 mg/dL   Creatinine, Ser 1.18 (*) 0.50 - 1.10 mg/dL   Calcium 8.8  8.4 - 10.5 mg/dL   Total Protein 6.4  6.0 - 8.3 g/dL   Albumin 3.0 (*) 3.5 - 5.2 g/dL   AST 15  0 - 37 U/L   ALT 15  0 - 35 U/L   Alkaline Phosphatase 61  39 - 117 U/L   Total Bilirubin 0.3  0.3 - 1.2 mg/dL   GFR calc non Af Amer 49 (*) >90 mL/min   GFR calc Af Amer 57 (*) >90 mL/min  LIPASE, BLOOD      Result Value Ref Range   Lipase 29  11 - 59 U/L  URINALYSIS, ROUTINE W REFLEX MICROSCOPIC      Result Value Ref Range   Color, Urine AMBER (*) YELLOW   APPearance CLOUDY (*) CLEAR   Specific Gravity, Urine 1.011  1.005 - 1.030   pH 6.5  5.0 - 8.0   Glucose, UA NEGATIVE  NEGATIVE mg/dL   Hgb urine dipstick LARGE (*) NEGATIVE   Bilirubin Urine NEGATIVE  NEGATIVE   Ketones, ur NEGATIVE  NEGATIVE mg/dL   Protein, ur 30 (*)  NEGATIVE mg/dL   Urobilinogen, UA 0.2  0.0 - 1.0 mg/dL   Nitrite NEGATIVE  NEGATIVE   Leukocytes, UA MODERATE (*) NEGATIVE  ETHANOL      Result Value Ref Range   Alcohol, Ethyl (B) <11  0 - 11 mg/dL  URINE MICROSCOPIC-ADD ON      Result Value Ref Range   Squamous Epithelial / LPF RARE  RARE   WBC, UA 21-50  <3 WBC/hpf   RBC / HPF TOO NUMEROUS TO COUNT  <3 RBC/hpf   Bacteria, UA RARE  RARE  URINE RAPID DRUG SCREEN (HOSP PERFORMED)      Result Value Ref Range   Opiates POSITIVE (*) NONE DETECTED   Cocaine NONE DETECTED  NONE  DETECTED   Benzodiazepines POSITIVE (*) NONE DETECTED   Amphetamines NONE DETECTED  NONE DETECTED   Tetrahydrocannabinol NONE DETECTED  NONE DETECTED   Barbiturates NONE DETECTED  NONE DETECTED   Ct Abdomen Wo Contrast  08/06/2013   CLINICAL DATA:  History of left renal calculus status post percutaneous nephrostomy and left nephrolithotomy. Abdominal pain and hematuria.  EXAM: CT ABDOMEN WITHOUT CONTRAST  TECHNIQUE: Multidetector CT imaging of the abdomen was performed following the standard protocol without IV contrast.  COMPARISON:  IR DIL URETER*L* dated 08/05/2013; CT ABD W/CM dated 06/14/2013  FINDINGS: Linear bilateral lower lobe atelectasis or scarring noted. Areas of mild bronchiectasis are noted. Trace pleural fluid or thickening.  Vertebral body hemangiomas incidentally noted T9 and T10. Trace ascites is present. Interval left subcostal approach percutaneous nephrostomy tube is now in place with persistent mild left hydronephrosis. The previously seen left renal calculus is no longer discretely identifiable, and multiple radiopaque presumed stone fragments are identified within the left intrarenal collecting system/ renal pelvis as well as apparently outside the renal collecting system in the region of the left renal hilum for example image 28 and 27. Moderate left perinephric fluid is identified. This measures low fluid density. Unenhanced abdominal viscera otherwise unchanged. No free air visualized. Moderate atheromatous aortic calcification reidentified without visualized aneurysm.  IMPRESSION: Interval placement of left subcostal approach percutaneous nephrostomy with apparent fragmentation of the previously seen dominant left renal pelvis calculus.  Moderate left perinephric low-density fluid and presumed stone fragments outside the renal collecting system are identified which may indicate forniceal rupture or sequela of instrumentation with perforation and perinephric urinoma formation.  These  results will be called to the ordering clinician or representative by the Radiologist Assistant, and communication documented in the PACS Dashboard.   Electronically Signed   By: Conchita Paris M.D.   On: 08/06/2013 09:03   Ir Nephrostogram Left  08/06/2013   CLINICAL DATA:  Status post percutaneous nephrolithotomy yesterday to removed left-sided pelvic calculus. CT has demonstrated some probable urine leak around the kidney and request is been made to place a ureteral stent. A Council tip nephrostomy remains in place following the procedure.  EXAM: 1.  LEFT NEPHROSTOGRAM.  2.  LEFT URETERAL STENT PLACEMENT  COMPARISON:  IR INTRO URET CATH PERC *L* dated 08/05/2013; CT ABD W/CM dated 06/14/2013; CT ABDOMEN W/O CM dated 08/06/2013; IR DIL URETER*L* dated 08/05/2013  ANESTHESIA/SEDATION: Sedation:  3.0 Mg IV Versed; 100 mcg IV Fentanyl.  Total Moderate Sedation Time:  20 Minutes.  MEDICATIONS: 400 mg IV Cipro. Ciprofloxacin was given within two hours of incision.  CONTRAST:  20 ML OMNIPAQUE 300  FLUOROSCOPY TIME:  7 min and 42 seconds.  PROCEDURE: The procedure, risks, benefits, and alternatives were explained to the  patient. Questions regarding the procedure were encouraged and answered. The patient understands and consents to the procedure.  The nephrostomy tube and surrounding skin was prepped with Betadine in a sterile fashion, and a sterile drape was applied covering the operative field. A sterile gown and sterile gloves were used for the procedure. Local anesthesia was provided with 1% Lidocaine.  The preexisting nephrostomy tube was injected with contrast material. Fluoroscopic spot images were obtained of the collecting system and ureter. Additional injection of the ureter was performed after removal of the nephrostomy tube over a guidewire and insertion of a 5-French catheter into the ureter.  A guidewire was advanced into the bladder. The wire was kinked to estimate appropriate length of ureteral stent. An  8-French by 24 cm ureteral stent was then advanced over an additional guidewire. The distal portion was formed at the level of the bladder. Proximal portion was then formed at the level of the renal collecting system. The stent pusher was then injected with contrast material to evaluate drainage under fluoroscopy.  Attempt was made to advance a 16 French nephrostomy tube over a guidewire and into the collecting system. Ultimately, percutaneous access of the collecting system was removed. The access site was closed with subcutaneous 3-0 Monocryl and application of Steri-Strips.  COMPLICATIONS: None.  FINDINGS: Nephrostogram demonstrates irregularity of the collecting system without overt extravasation of contrast. The ureter is patent. A ureteral stent was placed which extends from the renal pelvis into the bladder. Contrast injection shows drainage down the stent and into the bladder.  A nephrostomy tube could not be advanced successfully into the renal pelvis due to irregularity small size of the pelvis. There was fear that advancing a nephrostomy tube would push the stent down the ureter. Percutaneous access was ultimately removed.  IMPRESSION: Successful placement of left ureteral stent. Prior to stent placement, nephrostogram demonstrates irregularity of the collecting system without overt extravasation of contrast. An 8-French by 24 cm ureteral stent was able be placed and showed appropriate drainage allowing removal of percutaneous access.   Electronically Signed   By: Aletta Edouard M.D.   On: 08/06/2013 16:59   Ir Dil Ureter Left  08/05/2013   CLINICAL DATA:  Left nephrolithiasis  EXAM: DILATATION OF NEPHROSTOMY TRACT  TECHNIQUE: The procedure was performed in the OR assisting Dr. Risa Grill. Previously placed nephroureteral catheter was exchanged over an Amplatz wire for a 9 French peel-away sheath, through which a parallel Amplatz wire was placed as a safety wire. Over the working wire, the 10 mm balloon  was advanced to dilate the parenchymal tract and allow advancement of the 10 mm working sheath to allow on percutaneous nephrolithotomy by Dr. Risa Grill, reported separately. A series of fluoroscopic spot images document percutaneous nephrolithotomy. The final image documents placement of a percutaneous nephrostomy catheter. Several small residual calculi are noted in the mid and upper pole of the renal collecting system.  IMPRESSION: 1. Dilatation of the nephrostomy tract to facilitate nephrolithotomy by Dr. Risa Grill.   Electronically Signed   By: Arne Cleveland M.D.   On: 08/05/2013 14:40   Dg C-arm 61-120 Min-no Report  08/05/2013   CLINICAL DATA: Perc   C-ARM 61-120 MINUTES  Fluoroscopy was utilized by the requesting physician.  No radiographic  interpretation.    Ir Oris Drone Cath Perc Left  08/06/2013   CLINICAL DATA:  Status post percutaneous nephrolithotomy yesterday to removed left-sided pelvic calculus. CT has demonstrated some probable urine leak around the kidney and request is been  made to place a ureteral stent. A Council tip nephrostomy remains in place following the procedure.  EXAM: 1.  LEFT NEPHROSTOGRAM.  2.  LEFT URETERAL STENT PLACEMENT  COMPARISON:  IR INTRO URET CATH PERC *L* dated 08/05/2013; CT ABD W/CM dated 06/14/2013; CT ABDOMEN W/O CM dated 08/06/2013; IR DIL URETER*L* dated 08/05/2013  ANESTHESIA/SEDATION: Sedation:  3.0 Mg IV Versed; 100 mcg IV Fentanyl.  Total Moderate Sedation Time:  20 Minutes.  MEDICATIONS: 400 mg IV Cipro. Ciprofloxacin was given within two hours of incision.  CONTRAST:  20 ML OMNIPAQUE 300  FLUOROSCOPY TIME:  7 min and 42 seconds.  PROCEDURE: The procedure, risks, benefits, and alternatives were explained to the patient. Questions regarding the procedure were encouraged and answered. The patient understands and consents to the procedure.  The nephrostomy tube and surrounding skin was prepped with Betadine in a sterile fashion, and a sterile drape was applied covering  the operative field. A sterile gown and sterile gloves were used for the procedure. Local anesthesia was provided with 1% Lidocaine.  The preexisting nephrostomy tube was injected with contrast material. Fluoroscopic spot images were obtained of the collecting system and ureter. Additional injection of the ureter was performed after removal of the nephrostomy tube over a guidewire and insertion of a 5-French catheter into the ureter.  A guidewire was advanced into the bladder. The wire was kinked to estimate appropriate length of ureteral stent. An 8-French by 24 cm ureteral stent was then advanced over an additional guidewire. The distal portion was formed at the level of the bladder. Proximal portion was then formed at the level of the renal collecting system. The stent pusher was then injected with contrast material to evaluate drainage under fluoroscopy.  Attempt was made to advance a 16 French nephrostomy tube over a guidewire and into the collecting system. Ultimately, percutaneous access of the collecting system was removed. The access site was closed with subcutaneous 3-0 Monocryl and application of Steri-Strips.  COMPLICATIONS: None.  FINDINGS: Nephrostogram demonstrates irregularity of the collecting system without overt extravasation of contrast. The ureter is patent. A ureteral stent was placed which extends from the renal pelvis into the bladder. Contrast injection shows drainage down the stent and into the bladder.  A nephrostomy tube could not be advanced successfully into the renal pelvis due to irregularity small size of the pelvis. There was fear that advancing a nephrostomy tube would push the stent down the ureter. Percutaneous access was ultimately removed.  IMPRESSION: Successful placement of left ureteral stent. Prior to stent placement, nephrostogram demonstrates irregularity of the collecting system without overt extravasation of contrast. An 8-French by 24 cm ureteral stent was able be  placed and showed appropriate drainage allowing removal of percutaneous access.   Electronically Signed   By: Aletta Edouard M.D.   On: 08/06/2013 16:59   Ir Oris Drone Cath Perc Left  08/05/2013   CLINICAL DATA:  Symptomatic left renal calculus, pre nephrolithotomy  EXAM: LEFT PERCUTANEOUS NEPHROURETERAL CATHETER PLACEMENT UNDER ULTRASOUND AND FLUOROSCOPIC GUIDANCE  FLUOROSCOPY TIME:  5 min 30 seconds  TECHNIQUE: The procedure, risks (including but not limited to bleeding, infection, organ damage ), benefits, and alternatives were explained to the patient. Questions regarding the procedure were encouraged and answered. The patient understands and consents to the procedure.  The leftFlank region prepped with Betadine, draped in usual sterile fashion, infiltrated locally with 1% lidocaine.As antibiotic prophylaxis, Cipro 400mg  was ordered pre-procedure and administered intravenously within one hour of incision.  Intravenous Fentanyl  and Versed were administered as conscious sedation during continuous cardiorespiratory monitoring by the radiology RN, with a total moderate sedation time of 20 minutes.  Under real-time fluoroscopic guidance, a 21-gauge trocar needle was advanced into a posterior lower pole calyx using the calculus as a guide. Needle was exchanged over a guidewire for transitional dilator. Through this, angled Glidewire was advanced down the ureter. Contrast injection confirmed appropriate positioning. Catheter was exchanged over a guidewire for a 5 [place] Kumpe catheter, advanced into the urinary bladder. Catheter was capped and secured externally. No immediate complication.  IMPRESSION: 1. Technically successful left percutaneous nephroureteral catheter placement.   Electronically Signed   By: Arne Cleveland M.D.   On: 08/05/2013 10:53    MDM  Is felt that her somnolent state it is secondary to polypharmacy with benzodiazepines and opioids in combination. She's been taking oxycodone chronically  for years for back pain and neck pain and Xanax for several years well. Final diagnoses:  None   plan she is instructed take medications only as prescribed Diagnosis #1 postoperative pain #2 accidental drug overdose #3 hyponatremia  #4 anemia  Orlie Dakin, MD 08/10/13 1929

## 2013-08-10 NOTE — ED Notes (Signed)
Pt had stone removal and then stent placed on Tuesday.  Pt now with infection and fever.  On antibiotics.  Pain to right side increasing. Nausea

## 2013-08-10 NOTE — ED Notes (Signed)
MD at [facility] at bedside.

## 2013-08-10 NOTE — ED Notes (Signed)
Pt desat to 86% after Fentanyl given. Pt placed on 1L [place]. Pt awakens to voice commands and oxygen returns to normal. Pt 97% on 1 L. Pt in NAD and VSS

## 2013-08-10 NOTE — ED Notes (Signed)
Pt ambulated to BR and back to room with minimal assistance. Dr. Lenna Sciara at bedside

## 2013-08-10 NOTE — Discharge Instructions (Signed)
Take your medications only as prescribed. Keep your scheduled appointment with your urologist in 3 days. Return if your condition worsens for any reason

## 2013-08-11 ENCOUNTER — Emergency Department (HOSPITAL_COMMUNITY)
Admission: EM | Admit: 2013-08-11 | Discharge: 2013-08-11 | Disposition: A | Discharge status: Home / Self Care | Payer: Medicare Other | Attending: Emergency Medicine | Admitting: Emergency Medicine

## 2013-08-11 ENCOUNTER — Encounter (HOSPITAL_COMMUNITY): Payer: Self-pay | Admitting: Emergency Medicine

## 2013-08-11 DIAGNOSIS — Z5189 Encounter for other specified aftercare: Secondary | ICD-10-CM

## 2013-08-11 DIAGNOSIS — Z87442 Personal history of urinary calculi: Secondary | ICD-10-CM | POA: Insufficient documentation

## 2013-08-11 DIAGNOSIS — Z4801 Encounter for change or removal of surgical wound dressing: Secondary | ICD-10-CM | POA: Insufficient documentation

## 2013-08-11 DIAGNOSIS — Z79899 Other long term (current) drug therapy: Secondary | ICD-10-CM | POA: Insufficient documentation

## 2013-08-11 DIAGNOSIS — Z8739 Personal history of other diseases of the musculoskeletal system and connective tissue: Secondary | ICD-10-CM | POA: Insufficient documentation

## 2013-08-11 DIAGNOSIS — F319 Bipolar disorder, unspecified: Secondary | ICD-10-CM | POA: Insufficient documentation

## 2013-08-11 DIAGNOSIS — Z860100 Personal history of colon polyps, unspecified: Secondary | ICD-10-CM | POA: Insufficient documentation

## 2013-08-11 DIAGNOSIS — Z87891 Personal history of nicotine dependence: Secondary | ICD-10-CM | POA: Insufficient documentation

## 2013-08-11 DIAGNOSIS — F411 Generalized anxiety disorder: Secondary | ICD-10-CM | POA: Insufficient documentation

## 2013-08-11 DIAGNOSIS — Z792 Long term (current) use of antibiotics: Secondary | ICD-10-CM | POA: Insufficient documentation

## 2013-08-11 DIAGNOSIS — I10 Essential (primary) hypertension: Secondary | ICD-10-CM | POA: Insufficient documentation

## 2013-08-11 DIAGNOSIS — F1021 Alcohol dependence, in remission: Secondary | ICD-10-CM | POA: Insufficient documentation

## 2013-08-11 DIAGNOSIS — Z862 Personal history of diseases of the blood and blood-forming organs and certain disorders involving the immune mechanism: Secondary | ICD-10-CM | POA: Insufficient documentation

## 2013-08-11 DIAGNOSIS — Z8639 Personal history of other endocrine, nutritional and metabolic disease: Secondary | ICD-10-CM | POA: Insufficient documentation

## 2013-08-11 DIAGNOSIS — Z85828 Personal history of other malignant neoplasm of skin: Secondary | ICD-10-CM | POA: Insufficient documentation

## 2013-08-11 DIAGNOSIS — J449 Chronic obstructive pulmonary disease, unspecified: Secondary | ICD-10-CM | POA: Insufficient documentation

## 2013-08-11 DIAGNOSIS — J4489 Other specified chronic obstructive pulmonary disease: Secondary | ICD-10-CM | POA: Insufficient documentation

## 2013-08-11 DIAGNOSIS — K219 Gastro-esophageal reflux disease without esophagitis: Secondary | ICD-10-CM | POA: Insufficient documentation

## 2013-08-11 DIAGNOSIS — Z8619 Personal history of other infectious and parasitic diseases: Secondary | ICD-10-CM | POA: Insufficient documentation

## 2013-08-11 NOTE — ED Provider Notes (Signed)
CSN: [id number]     Arrival date & time 08/11/13  1657 History  This chart was scribed for non-physician practitioner, Quincy Carnes, PA-C,working with Tanna Furry, MD, by Marlowe Kays, ED Scribe.  This patient was seen in room WTR7/WTR7 and the patient's care was started at 5:30 PM.  Chief Complaint  Patient presents with  . Wound Check   The history is provided by the patient. No language interpreter was used.   HPI Comments:  Erin Lopez is a 63 y.o. female who presents to the Emergency Department wanting a wound check from having a kidney stone removed five days ago by Dr. Risa Grill.  Pt states the dressing has been saturated and wants it changed. She denies any fever, chills, pain or bleeding from the area. She states she has a follow up appt with her doctor in two days.   Past Medical History  Diagnosis Date  . Hypertension   . Bipolar 1 disorder     sees a psychiatrist  . Anxiety   . Hay fever   . Diverticular disease of colon     + 'itis  . Nephrolithiasis     Dense 1.5 x 2.5 cm left renal pelvis stone (Dr. Risa Grill to do percutaneous nephrectomy as per 06/21/13 consult visit)  . Hyperlipidemia     no meds in > a decade per pt  . COPD (chronic obstructive pulmonary disease)   . Adenomatous colon polyp 2010    1 adenomatous and multiple hyperplastic (recall 5 yrs)  . Fibromyalgia   . Alcoholism in remission     states last alcohol 11 yrs ago  . History of tobacco abuse   . History of abnormal cervical Pap smear     Cryo and LEEP age 51 and 57.  . Vitamin D deficiency 05/2011  . Osteopenia 05/2010    repeat bone densitometry 3-5 yrs  . Memory loss     Abnl MRI brain and CT brain c/w chronic microvascular ischemia  . History of Helicobacter pylori infection     +gastric biopsy (gastritis but no metaplasia, dysplasia, or malignancy identified)  . Environmental allergies   . Shortness of breath   . GERD (gastroesophageal reflux disease)   . Basal cell carcinoma of nasal  sidewall    Past Surgical History  Procedure Laterality Date  . Cervical spine surgery       x 3 (in the 2000's)  . Total knee arthroplasty  2003  . Cesarean section  1994  . Tonsillectomy and adenoidectomy  1975  . Colonoscopy w/ polypectomy  05/2008    recall 5 yrs  . Esophagogastroduodenoscopy  2010    Candidal esophagitis by gross appearance on EGD, Esoph brushings neg for Candida or malignant cells.  . Nephrolithotomy Left 08/05/2013    Procedure: NEPHROLITHOTOMY PERCUTANEOUS;  Surgeon: [name], MD;  Location: [facility];  Service: Urology;  Laterality: Left;   Family History  Problem Relation Age of Onset  . Alcohol abuse Mother   . Arthritis Mother   . Hypertension Mother   . Hyperlipidemia Mother   . Diabetes Mother   . Cancer Father     colon  . Prostate cancer Father   . Hypertension Father   . Hyperlipidemia Father   . Diabetes Father    History  Substance Use Topics  . Smoking status: Former Research scientist (life sciences)  . Smokeless tobacco: Never Used  . Alcohol Use: No     Comment: none in 11 years   OB  History   Grav Para Term Preterm Abortions TAB SAB Ect Mult Living                 Review of Systems  Constitutional: Negative for fever and chills.  Skin: Positive for wound (well-healing incision over left kidney).  All other systems reviewed and are negative.   Allergies  Ceftin and Prednisone  Home Medications   Prior to Admission medications   Medication Sig Start Date End Date Taking? Authorizing Provider  albuterol (PROVENTIL HFA) 108 (90 BASE) MCG/ACT inhaler Inhale 2 puffs into the lungs every 4 (four) hours as needed for wheezing or shortness of breath. 06/25/13   Tammi Sou, MD  ALPRAZolam Duanne Moron) 1 MG tablet Take 1 mg by mouth 3 (three) times daily.    Historical Provider, MD  carvedilol (COREG) 12.5 MG tablet Take 12.5 mg by mouth 2 (two) times daily with a meal.    Historical Provider, MD  cetirizine (ZYRTEC) 10 MG tablet Take 10 mg by mouth daily.     Historical Provider, MD  cyclobenzaprine (FLEXERIL) 10 MG tablet Take 10 mg by mouth 3 (three) times daily as needed for muscle spasms.    Historical Provider, MD  lamoTRIgine (LAMICTAL) 100 MG tablet Take 300 mg by mouth at bedtime.     Historical Provider, MD  lisinopril (PRINIVIL,ZESTRIL) 10 MG tablet Take 10 mg by mouth at bedtime.    Historical Provider, MD  magnesium hydroxide (MILK OF MAGNESIA) 400 MG/5ML suspension Take by mouth daily as needed for mild constipation.    Historical Provider, MD  montelukast (SINGULAIR) 10 MG tablet Take 10 mg by mouth at bedtime.    Historical Provider, MD  oxyCODONE (ROXICODONE) 15 MG immediate release tablet Take 15 mg by mouth 3 (three) times daily.    Historical Provider, MD  polyethylene glycol powder (GLYCOLAX/MIRALAX) powder 1 capful once daily for constipation 07/29/13   Tammi Sou, MD  ranitidine (ZANTAC) 150 MG capsule Take 150 mg by mouth 2 (two) times daily.    Historical Provider, MD  sulfamethoxazole-trimethoprim (BACTRIM DS) 800-160 MG per tablet Take 1 tablet by mouth 2 (two) times daily. 08/09/13   [name], MD  tiotropium (SPIRIVA HANDIHALER) 18 MCG inhalation capsule Place 1 capsule (18 mcg total) into inhaler and inhale daily. 06/25/13   Tammi Sou, MD  venlafaxine XR (EFFEXOR-XR) 150 MG 24 hr capsule Take 150 mg by mouth daily with breakfast.    Historical Provider, MD  zolpidem (AMBIEN) 10 MG tablet Take 10 mg by mouth at bedtime.    Historical Provider, MD   Triage Vitals: BP 100/50  Pulse 91  Temp(Src) 97.9 F (36.6 C) (Oral)  SpO2 94% Physical Exam  Nursing note and vitals reviewed. Constitutional: She is oriented to person, place, and time. She appears well-developed and well-nourished.  HENT:  Head: Normocephalic and atraumatic.  Mouth/Throat: Oropharynx is clear and moist.  Eyes: Conjunctivae and EOM are normal. Pupils are equal, round, and reactive to light.  Neck: Normal range of motion.  Cardiovascular:  Normal rate, regular rhythm and normal heart sounds.   Pulmonary/Chest: Effort normal and breath sounds normal.  Musculoskeletal: Normal range of motion.  Neurological: She is alert and oriented to person, place, and time.  Skin: Skin is warm and dry.  Well-healing 1 inch surgical incision to left CVA. No drainage. No cellulitic change.  Psychiatric: She has a normal mood and affect.    ED Course  Procedures (including critical care time)  DIAGNOSTIC STUDIES: Oxygen Saturation is 94% on RA, normal by my interpretation.   COORDINATION OF CARE: 5:33 PM- Will redress wound. Advised pt to keep follow up appt with MD in two days. Pt verbalizes understanding and agrees to plan.  Labs Review Labs Reviewed - No data to display  Imaging Review No results found.   EKG Interpretation None      MDM   Final diagnoses:  Visit for wound check   Wound healing well without signs of infection.  Dressing is saturated with clear fluid, no purulence.  Dressing changed.  She will FU with Dr. Risa Grill in 2 days as previously scheduled or sooner if problems occur.  Discussed plan with patient, he/she acknowledged understanding and agreed with plan of care.  Return precautions given for new or worsening symptoms.  I personally performed the services described in this documentation, which was scribed in my presence. The recorded information has been reviewed and is accurate.  Larene Pickett, PA-C 08/11/13 1747

## 2013-08-11 NOTE — Discharge Instructions (Signed)
Continue home meds as directed. Follow-up with Dr. Risa Grill on Tuesday as previously scheduled. Return to the ED for new or worsening symptoms.

## 2013-08-11 NOTE — ED Notes (Signed)
Pt states she had a kidney stone invasively removed from her L kidney 6 days ago and now the dressing is saturated and she wants it changed. Alert and oriented. No other complaints.

## 2013-08-12 LAB — URINE CULTURE
CULTURE: NO GROWTH
Colony Count: NO GROWTH
SPECIAL REQUESTS: NORMAL

## 2013-08-14 NOTE — ED Provider Notes (Signed)
Medical screening examination/treatment/procedure(s) were conducted as a shared visit with non-physician practitioner(s) and myself.  I personally evaluated the patient during the encounter.   EKG Interpretation None        Tanna Furry, MD 08/14/13 [phone]

## 2013-08-22 ENCOUNTER — Encounter (HOSPITAL_BASED_OUTPATIENT_CLINIC_OR_DEPARTMENT_OTHER): Payer: Self-pay | Admitting: *Deleted

## 2013-08-22 ENCOUNTER — Other Ambulatory Visit: Payer: Self-pay | Admitting: Urology

## 2013-08-22 ENCOUNTER — Encounter: Payer: Self-pay | Admitting: Family Medicine

## 2013-08-23 ENCOUNTER — Encounter (HOSPITAL_BASED_OUTPATIENT_CLINIC_OR_DEPARTMENT_OTHER): Payer: Self-pay | Admitting: *Deleted

## 2013-08-23 NOTE — Progress Notes (Signed)
NPO AFTER MN. ARRIVE AT 0700. PT GETTING LAB WORK DONE Tuesday 08-27-2013. CURRENT EKG / CXR IN EPIC AND CHART. WILL TAKE AM MEDS W/ SIPS OF WATER.

## 2013-08-26 ENCOUNTER — Other Ambulatory Visit: Payer: Self-pay | Admitting: Family Medicine

## 2013-08-27 ENCOUNTER — Encounter (HOSPITAL_BASED_OUTPATIENT_CLINIC_OR_DEPARTMENT_OTHER): Payer: Self-pay | Admitting: Anesthesiology

## 2013-08-27 LAB — CBC
HCT: 28.2 % — ABNORMAL LOW (ref 36.0–46.0)
Hemoglobin: 9.3 g/dL — ABNORMAL LOW (ref 12.0–15.0)
MCH: 29.5 pg (ref 26.0–34.0)
MCHC: 33 g/dL (ref 30.0–36.0)
MCV: 89.5 fL (ref 78.0–100.0)
PLATELETS: 270 10*3/uL (ref 150–400)
RBC: 3.15 MIL/uL — ABNORMAL LOW (ref 3.87–5.11)
RDW: 13.9 % (ref 11.5–15.5)
WBC: 3 10*3/uL — AB (ref 4.0–10.5)

## 2013-08-27 LAB — BASIC METABOLIC PANEL
BUN: 12 mg/dL (ref 6–23)
CALCIUM: 9.4 mg/dL (ref 8.4–10.5)
CO2: 27 mEq/L (ref 19–32)
Chloride: 104 mEq/L (ref 96–112)
Creatinine, Ser: 1.01 mg/dL (ref 0.50–1.10)
GFR calc Af Amer: 69 mL/min — ABNORMAL LOW (ref >90)
GFR, EST NON AFRICAN AMERICAN: 59 mL/min — AB (ref >90)
Glucose, Bld: 96 mg/dL (ref 70–99)
Potassium: 4.7 mEq/L (ref 3.7–5.3)
SODIUM: 142 meq/L (ref 137–147)

## 2013-08-27 NOTE — Anesthesia Preprocedure Evaluation (Signed)
Anesthesia Evaluation  Patient identified by MRN, date of birth, ID band Patient awake    Reviewed: Allergy & Precautions, H&P , NPO status , Patient's Chart, lab work & pertinent test results  Airway Mallampati: II TM Distance: >3 FB Neck ROM: Full    Dental no notable dental hx.    Pulmonary shortness of breath, COPDformer smoker,  breath sounds clear to auscultation  Pulmonary exam normal       Cardiovascular hypertension, Pt. on medications and Pt. on home beta blockers Rhythm:Regular Rate:Normal     Neuro/Psych PSYCHIATRIC DISORDERS Anxiety TIA Neuromuscular disease    GI/Hepatic Neg liver ROS, GERD-  Medicated,  Endo/Other  negative endocrine ROS  Renal/GU Renal disease  negative genitourinary   Musculoskeletal  (+) Fibromyalgia -  Abdominal   Peds negative pediatric ROS (+)  Hematology negative hematology ROS (+)   Anesthesia Other Findings   Reproductive/Obstetrics negative OB ROS                           Anesthesia Physical Anesthesia Plan  ASA: III  Anesthesia Plan: General   Post-op Pain Management:    Induction: Intravenous  Airway Management Planned: LMA  Additional Equipment:   Intra-op Plan:   Post-operative Plan: Extubation in OR  Informed Consent: I have reviewed the patients History and Physical, chart, labs and discussed the procedure including the risks, benefits and alternatives for the proposed anesthesia with the patient or authorized representative who has indicated his/her understanding and acceptance.   Dental advisory given  Plan Discussed with: CRNA  Anesthesia Plan Comments:         Anesthesia Quick Evaluation

## 2013-08-28 ENCOUNTER — Ambulatory Visit (HOSPITAL_BASED_OUTPATIENT_CLINIC_OR_DEPARTMENT_OTHER): Payer: Medicare Other | Admitting: Anesthesiology

## 2013-08-28 ENCOUNTER — Ambulatory Visit (HOSPITAL_BASED_OUTPATIENT_CLINIC_OR_DEPARTMENT_OTHER)
Admission: RE | Admit: 2013-08-28 | Discharge: 2013-08-28 | Disposition: A | Discharge status: Home / Self Care | Payer: Medicare Other | Source: Ambulatory Visit | Attending: Urology | Admitting: Urology

## 2013-08-28 ENCOUNTER — Encounter (HOSPITAL_BASED_OUTPATIENT_CLINIC_OR_DEPARTMENT_OTHER): Payer: Medicare Other | Admitting: Anesthesiology

## 2013-08-28 ENCOUNTER — Encounter (HOSPITAL_BASED_OUTPATIENT_CLINIC_OR_DEPARTMENT_OTHER)
Admission: RE | Disposition: A | Discharge status: Home / Self Care | Payer: Self-pay | Source: Ambulatory Visit | Attending: Urology

## 2013-08-28 ENCOUNTER — Encounter (HOSPITAL_BASED_OUTPATIENT_CLINIC_OR_DEPARTMENT_OTHER): Payer: Self-pay | Admitting: *Deleted

## 2013-08-28 DIAGNOSIS — N200 Calculus of kidney: Secondary | ICD-10-CM | POA: Insufficient documentation

## 2013-08-28 DIAGNOSIS — F3289 Other specified depressive episodes: Secondary | ICD-10-CM | POA: Insufficient documentation

## 2013-08-28 DIAGNOSIS — E7800 Pure hypercholesterolemia, unspecified: Secondary | ICD-10-CM | POA: Insufficient documentation

## 2013-08-28 DIAGNOSIS — Z7982 Long term (current) use of aspirin: Secondary | ICD-10-CM | POA: Insufficient documentation

## 2013-08-28 DIAGNOSIS — Z8673 Personal history of transient ischemic attack (TIA), and cerebral infarction without residual deficits: Secondary | ICD-10-CM | POA: Insufficient documentation

## 2013-08-28 DIAGNOSIS — Z859 Personal history of malignant neoplasm, unspecified: Secondary | ICD-10-CM | POA: Insufficient documentation

## 2013-08-28 DIAGNOSIS — M129 Arthropathy, unspecified: Secondary | ICD-10-CM | POA: Insufficient documentation

## 2013-08-28 DIAGNOSIS — F411 Generalized anxiety disorder: Secondary | ICD-10-CM | POA: Insufficient documentation

## 2013-08-28 DIAGNOSIS — J45909 Unspecified asthma, uncomplicated: Secondary | ICD-10-CM | POA: Insufficient documentation

## 2013-08-28 DIAGNOSIS — I10 Essential (primary) hypertension: Secondary | ICD-10-CM | POA: Insufficient documentation

## 2013-08-28 DIAGNOSIS — F329 Major depressive disorder, single episode, unspecified: Secondary | ICD-10-CM | POA: Insufficient documentation

## 2013-08-28 DIAGNOSIS — Z79899 Other long term (current) drug therapy: Secondary | ICD-10-CM | POA: Insufficient documentation

## 2013-08-28 HISTORY — DX: Personal history of colonic polyps: Z86.010

## 2013-08-28 HISTORY — DX: Unspecified temporomandibular joint disorder, unspecified side: M26.609

## 2013-08-28 HISTORY — DX: Other specified postprocedural states: Z98.890

## 2013-08-28 HISTORY — DX: Personal history of transient ischemic attack (TIA), and cerebral infarction without residual deficits: Z86.73

## 2013-08-28 HISTORY — DX: Personal history of other malignant neoplasm of skin: Z85.828

## 2013-08-28 HISTORY — PX: CYSTOSCOPY WITH URETEROSCOPY AND STENT PLACEMENT: SHX6377

## 2013-08-28 HISTORY — DX: Personal history of adenomatous and serrated colon polyps: Z86.0101

## 2013-08-28 HISTORY — PX: HOLMIUM LASER APPLICATION: SHX5852

## 2013-08-28 SURGERY — CYSTOURETEROSCOPY, WITH STENT INSERTION
Anesthesia: General | Site: Ureter | Laterality: Left

## 2013-08-28 MED ORDER — DEXAMETHASONE SODIUM PHOSPHATE 4 MG/ML IJ SOLN
INTRAMUSCULAR | Status: DC | PRN
  Administered 2013-08-28: 4 mg via INTRAVENOUS

## 2013-08-28 MED ORDER — LIDOCAINE HCL (CARDIAC) 20 MG/ML IV SOLN
INTRAVENOUS | Status: DC | PRN
  Administered 2013-08-28: 60 mg via INTRAVENOUS

## 2013-08-28 MED ORDER — BELLADONNA ALKALOIDS-OPIUM 16.2-60 MG RE SUPP
RECTAL | Status: AC
  Filled 2013-08-28: qty 1

## 2013-08-28 MED ORDER — FENTANYL CITRATE 0.05 MG/ML IJ SOLN
INTRAMUSCULAR | Status: AC
  Filled 2013-08-28: qty 6

## 2013-08-28 MED ORDER — LACTATED RINGERS IV SOLN
INTRAVENOUS | Status: DC
  Administered 2013-08-28: 08:00:00 via INTRAVENOUS
  Filled 2013-08-28: qty 1000

## 2013-08-28 MED ORDER — MIDAZOLAM HCL 5 MG/5ML IJ SOLN
INTRAMUSCULAR | Status: DC | PRN
  Administered 2013-08-28: 2 mg via INTRAVENOUS

## 2013-08-28 MED ORDER — FENTANYL CITRATE 0.05 MG/ML IJ SOLN
INTRAMUSCULAR | Status: DC | PRN
  Administered 2013-08-28 (×2): 50 ug via INTRAVENOUS

## 2013-08-28 MED ORDER — IOHEXOL 350 MG/ML SOLN
INTRAVENOUS | Status: DC | PRN
  Administered 2013-08-28: 10 mL

## 2013-08-28 MED ORDER — MIDAZOLAM HCL 2 MG/2ML IJ SOLN
INTRAMUSCULAR | Status: AC
  Filled 2013-08-28: qty 2

## 2013-08-28 MED ORDER — ONDANSETRON HCL 4 MG/2ML IJ SOLN
INTRAMUSCULAR | Status: DC | PRN
  Administered 2013-08-28: 4 mg via INTRAVENOUS

## 2013-08-28 MED ORDER — SODIUM CHLORIDE 0.9 % IR SOLN
Status: DC | PRN
  Administered 2013-08-28: 4000 mL

## 2013-08-28 MED ORDER — PROPOFOL 10 MG/ML IV BOLUS
INTRAVENOUS | Status: DC | PRN
  Administered 2013-08-28: 200 mg via INTRAVENOUS

## 2013-08-28 MED ORDER — KETOROLAC TROMETHAMINE 30 MG/ML IJ SOLN
INTRAMUSCULAR | Status: DC | PRN
  Administered 2013-08-28: 30 mg via INTRAVENOUS

## 2013-08-28 MED ORDER — LIDOCAINE HCL 2 % EX GEL
CUTANEOUS | Status: DC | PRN
  Administered 2013-08-28: 1 via URETHRAL

## 2013-08-28 MED ORDER — CIPROFLOXACIN IN D5W 400 MG/200ML IV SOLN
400.0000 mg | INTRAVENOUS | Status: AC
  Administered 2013-08-28: 400 mg via INTRAVENOUS
  Filled 2013-08-28: qty 200

## 2013-08-28 SURGICAL SUPPLY — 29 items
ADAPTER CATH URET PLST 4-6FR (CATHETERS) IMPLANT
BAG DRAIN URO-CYSTO SKYTR STRL (DRAIN) ×2 IMPLANT
BENZOIN TINCTURE PRP APPL 2/3 (GAUZE/BANDAGES/DRESSINGS) IMPLANT
CANISTER SUCT LVC 12 LTR MEDI- (MISCELLANEOUS) ×2 IMPLANT
CATH INTERMIT  6FR 70CM (CATHETERS) ×2 IMPLANT
CATH URET 5FR 28IN CONE TIP (BALLOONS)
CATH URET 5FR 28IN OPEN ENDED (CATHETERS) IMPLANT
CATH URET 5FR 70CM CONE TIP (BALLOONS) IMPLANT
CLOTH BEACON ORANGE TIMEOUT ST (SAFETY) ×2 IMPLANT
DRAPE CAMERA CLOSED 9X96 (DRAPES) ×2 IMPLANT
DRSG TEGADERM 2-3/8X2-3/4 SM (GAUZE/BANDAGES/DRESSINGS) IMPLANT
FIBER LASER TRAC TIP (UROLOGICAL SUPPLIES) ×2 IMPLANT
GLOVE BIO SURGEON STRL SZ7.5 (GLOVE) ×2 IMPLANT
GOWN STRL REIN XL XLG (GOWN DISPOSABLE) IMPLANT
GOWN STRL REUS W/ TWL LRG LVL3 (GOWN DISPOSABLE) ×1 IMPLANT
GOWN STRL REUS W/TWL LRG LVL3 (GOWN DISPOSABLE) ×1
GOWN STRL REUS W/TWL XL LVL3 (GOWN DISPOSABLE) ×2 IMPLANT
GUIDEWIRE 0.038 PTFE COATED (WIRE) IMPLANT
GUIDEWIRE ANG ZIPWIRE 038X150 (WIRE) IMPLANT
GUIDEWIRE STR DUAL SENSOR (WIRE) ×2 IMPLANT
KIT BALLIN UROMAX 15FX10 (LABEL) IMPLANT
KIT BALLN UROMAX 15FX4 (MISCELLANEOUS) IMPLANT
KIT BALLN UROMAX 26 75X4 (MISCELLANEOUS)
NS IRRIG 500ML POUR BTL (IV SOLUTION) IMPLANT
PACK CYSTOSCOPY (CUSTOM PROCEDURE TRAY) ×2 IMPLANT
SET HIGH PRES BAL DIL (LABEL)
SHEATH ACCESS URETERAL 38CM (SHEATH) ×2 IMPLANT
SHEATH URET ACCESS 12FR/35CM (UROLOGICAL SUPPLIES) IMPLANT
SHEATH URET ACCESS 12FR/55CM (UROLOGICAL SUPPLIES) IMPLANT

## 2013-08-28 NOTE — Transfer of Care (Signed)
Immediate Anesthesia Transfer of Care Note  Patient: Erin Lopez  Procedure(s) Performed: Procedure(s) (LRB): CYSTOSCOPY, RGP,  WITH URETEROSCOPY AND STENT REMOVAL (Left) HOLMIUM LASER APPLICATION (Left)  Patient Location: PACU  Anesthesia Type: General  Level of Consciousness: awake, oriented, sedated and patient cooperative  Airway & Oxygen Therapy: Patient Spontanous Breathing and Patient connected to face mask oxygen  Post-op Assessment: Report given to PACU RN and Post -op Vital signs reviewed and stable  Post vital signs: Reviewed and stable  Complications: No apparent anesthesia complications

## 2013-08-28 NOTE — Discharge Instructions (Addendum)
[facility] °[phone] °Post Ureteroscopy With or Without Stent Instructions ° °Definitions: ° °Ureter: The duct that transports urine from the kidney to the bladder. °Stent:   A plastic hollow tube that is placed into the ureter, from the kidney to the                 bladder to prevent the ureter from swelling shut. ° °GENERAL INSTRUCTIONS: ° °Despite the fact that no skin incisions were used, the area around the ureter and bladder is raw and irritated. The stent is a foreign body which will further irritate the bladder wall. This irritation is manifested by increased frequency of urination, both day and night, and by an increase in the urge to urinate. In some, the urge to urinate is present almost always. Sometimes the urge is strong enough that you may not be able to stop yourself from urinating. The only real cure is to remove the stent and then give time for the bladder wall to heal which can't be done until the danger of the ureter swelling shut has passed, which varies. ° °You may see some blood in your urine while the stent is in place and a few days afterwards. Do not be alarmed, even if the urine was clear for a while. Get off your feet and drink lots of fluids until clearing occurs. If you start to pass clots or don't improve, call us. ° °DIET: °You may return to your normal diet immediately. Because of the raw surface of your bladder, alcohol, spicy foods, acid type foods and drinks with caffeine may cause irritation or frequency and should be used in moderation. To keep your urine flowing freely and to avoid constipation, drink plenty of fluids during the day ( 8-10 glasses ). °Tip: Avoid cranberry juice because it is very acidic. ° °ACTIVITY: °Your physical activity doesn't need to be restricted. However, if you are very active, you may see some blood in your urine. We suggest that you reduce your activity under these circumstances until the bleeding has stopped. ° °BOWELS: °It is  important to keep your bowels regular during the postoperative period. Straining with bowel movements can cause bleeding. A bowel movement every other day is reasonable. Use a mild laxative if needed, such as Milk of Magnesia 2-3 tablespoons, or 2 Dulcolax tablets. Call if you continue to have problems. If you have been taking narcotics for pain, before, during or after your surgery, you may be constipated. Take a laxative if necessary. ° ° °MEDICATION: °You should resume your pre-surgery medications unless told not to. In addition you will often be given an antibiotic to prevent infection. These should be taken as prescribed until the bottles are finished unless you are having an unusual reaction to one of the drugs. ° °PROBLEMS YOU SHOULD REPORT TO US: °· Fevers over 100.5 Fahrenheit. °· Heavy bleeding, or clots ( See above notes about blood in urine ). °· Inability to urinate. °· Drug reactions ( hives, rash, nausea, vomiting, diarrhea ). °· Severe burning or pain with urination that is not improving. ° °FOLLOW-UP: °You will need a follow-up appointment to monitor your progress. Call for this appointment at the number listed above. Usually the first appointment will be about three to fourteen days after your surgery. ° ° ° ° ° °Post Anesthesia Home Care Instructions ° °Activity: °Get plenty of rest for the remainder of the day. A responsible adult should stay with you for 24 hours following the procedure.  °  For the next 24 hours, DO NOT: °-Drive a car °-Operate machinery °-Drink alcoholic beverages °-Take any medication unless instructed by your physician °-Make any legal decisions or sign important papers. ° °Meals: °Start with liquid foods such as gelatin or soup. Progress to regular foods as tolerated. Avoid greasy, spicy, heavy foods. If nausea and/or vomiting occur, drink only clear liquids until the nausea and/or vomiting subsides. Call your physician if vomiting continues. ° °Special  Instructions/Symptoms: °Your throat may feel dry or sore from the anesthesia or the breathing tube placed in your throat during surgery. If this causes discomfort, gargle with warm salt water. The discomfort should disappear within 24 hours. ° °

## 2013-08-28 NOTE — Anesthesia Postprocedure Evaluation (Signed)
  Anesthesia Post-op Note  Patient: Erin Lopez  Procedure(s) Performed: Procedure(s) (LRB): CYSTOSCOPY, RGP,  WITH URETEROSCOPY AND STENT REMOVAL (Left) HOLMIUM LASER APPLICATION (Left)  Patient Location: PACU  Anesthesia Type: General  Level of Consciousness: awake and alert   Airway and Oxygen Therapy: Patient Spontanous Breathing  Post-op Pain: mild  Post-op Assessment: Post-op Vital signs reviewed, Patient's Cardiovascular Status Stable, Respiratory Function Stable, Patent Airway and No signs of Nausea or vomiting  Last Vitals:  Filed Vitals:   08/28/13 1000  BP: 105/61  Pulse: 70  Temp:   Resp: 15    Post-op Vital Signs: stable   Complications: No apparent anesthesia complications

## 2013-08-28 NOTE — Op Note (Signed)
Preoperative diagnosis: Residual left stone fragments status post PCNL Postoperative diagnosis: Same  Procedure: Cystoscopy, removal of left double-J stent, flexible ureteroscopy, holmium laser lithotripsy Surgeon: [name] M.D.  Anesthesia: Gen.  Indications: Patient is 63 years of age. Patient was originally sent to [place] with a large dense stone in her left renal pelvis. In the size and location the stone we felt percutaneous nephrolithotomy with treatment of choice. That procedure went well without obvious complication. It appeared that the great majority the stone had been removed. On followup imaging she did have some scattered fragments throughout the kidney none much larger than 4-5 mm. She eventually had a double-J stent placed through the nephrostomy tube by interventional radiology. She did have some postoperative bleeding and a blood loss anemia. She eventually was discharged and has continued to do well clinically. Recent assessment showed a well-positioned double-J stent with several fragments in the left kidney. She presents now for double-J stent removal with flexible ureteroscopy and attempt to remove her residual fragments. Risks benefits of this procedure were discussed with her and her daughter. Recent urine culture in our office was no growth.      Technique and findings: Patient was brought the operating room she had successful induction general anesthesia. She was placed in lithotomy position prepped and draped in usual manner. She had PAS compression boots placed and received perioperative ciprofloxacin. Appropriate surgical timeout was performed. Cystoscopy was then performed. The bladder was endoscopically normal. Double-J stent was partially removed.  Guidewire was placed in the stent to the left renal pelvis.  Over the guidewire I placed an open-ended catheter and the guidewire was removed. Retrograde pyelogram was then done with fluoroscopic interpretation. The collecting  system all appeared intact and normal. There were no gross filling defects or other abnormalities appreciated.   Guidewire was replaced to the open-ended catheter. A flexible digital ureteroscope sheath was then placed to the level of the ureteropelvic junction. The digital flexible ureteroscope was then used to carefully perform endoscopy the entire collecting system. The only significant stone noted was approximately 5 mm and was adherent to the wall of the renal pelvis. Holmium laser lithotripter fiber of 200  was used at 0.5 J x5 Hz. The stone was broken into 10+ small pieces. No other residual calculi bigger than 1 mm was really noted. We were then removed the sheath and the digital ureteroscope. The bladder was drained. The patient was brought to recovery room stable condition.

## 2013-08-28 NOTE — H&P (View-Only) (Signed)
History of Present Illness   Erin Lopez presents today as a referral from Dr Julien Nordmann for further assessment and potential treatment of a large left renal calculus. Erin Lopez is currently 63 years of age. In 2007, she was seen here by Dr Joelyn Oms who was subsequently retired from our practice. At that time she had been diagnosed with a nonobstructing asymptomatic 7 mm left renal calculus. No treatment was recommended at that time. Recently the patient went to the emergency room due to some lability of her blood pressure. As part of that assessment she had a chest x-ray which showed a questionable large left renal stone. She has subsequently had CT imaging. This shows a very dense 1.5 cm x 2.5 cm stone in the left renal pelvis. It does not appear to be causing any high-grade obstruction, although there may be potentially some mild calyceal dilation. I have reviewed her images as well as the report. Hounsfield units to my reading were around 13-1400 and the stone does appear to be markedly dense. There is a second 5 mm stone in the lower pole calyx. She has had no significant issues with flank pain, gross hematuria. She does have a little bit of urinary hesitancy.      Past Medical History Problems  1. History of Anxiety (Symptom) (799.2) 2. History of Arthritis (V13.4) 3. History of Asthma (493.90) 4. History of Cancer (199.1) 5. History of depression (V11.8) 6. History of heartburn (V12.79) 7. History of hypercholesterolemia (V12.29) 8. History of hypertension (V12.59) 9. History of Ischemic Stroke (436)  Surgical History Problems  1. History of Cesarean Section 2. History of Knee Replacement 3. History of Neck Surgery  Current Meds 1. Adult Aspirin Low Strength 81 MG CHEW;  Therapy: (Recorded:20Mar2015) to Recorded 2. Carvedilol 6.25 MG Oral Tablet;  Therapy: 24Sep2014 to Recorded 3. Doxycycline Hyclate 50 MG Oral Capsule;  Therapy: [id number] to Recorded 4. LamoTRIgine 100 MG Oral  Tablet;  Therapy: [id number] to Recorded 5. Lisinopril 10 MG Oral Tablet;  Therapy: [phone] to Recorded 6. Methocarbamol 750 MG Oral Tablet;  Therapy: (Recorded:20Mar2015) to Recorded 7. Montelukast Sodium 10 MG Oral Tablet;  Therapy: [id number] to Recorded 8. OxyCODONE HCl - 15 MG Oral Tablet;  Therapy: [id number] to Recorded 9. Venlafaxine HCl ER 150 MG Oral Capsule Extended Release 24 Hour;  Therapy: 24Sep2014 to Recorded 10. Zolpidem Tartrate 10 MG Oral Tablet;   Therapy: (Recorded:20Mar2015) to Recorded  Allergies Medication  1. Cefotan SOLN  Family History Problems  1. Family history of Parkinson's disease (V17.2) : Mother 2. Family history of Prostate Cancer (V16.42)   Dad 3. Family history of Urologic Disorder (V18.7)   kidney stones, dad  Social History Problems    Denied: History of Alcohol use   Caffeine Use   3 per day   Family history of Death In The Family Mother   Father deceased   51yrs, cancer   Former smoker (V15.82)   Marital History - Divorced   Mother deceased   65yrs, parkinson   Occupation:   disabled   Tobacco Use (V15.82)   1 pack per day for 20 years, quit 71 yrs ago  Review of Systems Genitourinary, constitutional, skin, eye, otolaryngeal, hematologic/lymphatic, cardiovascular, pulmonary, endocrine, musculoskeletal, gastrointestinal, neurological and psychiatric system(s) were reviewed and pertinent findings if present are noted.  Genitourinary: urinary hesitancy, but no dysuria and no hematuria.  Gastrointestinal: constipation.  Constitutional: feeling tired (fatigue).  Respiratory: shortness of breath.  Musculoskeletal: back pain and joint pain.  Psychiatric: depression  and anxiety.    Vitals  Height: 5 ft 6.5 in Weight: 165 lb  BMI Calculated: 26.23 BSA Calculated: 1.85 Blood Pressure: 122 / 84 Temperature: 98 F Heart Rate: 78  Physical Exam Constitutional: Well nourished and well developed . No acute  distress.  ENT:. The ears and nose are normal in appearance.  Pulmonary: No respiratory distress and normal respiratory rhythm and effort.  Cardiovascular: Heart rate and rhythm are normal . No peripheral edema.  Abdomen: The abdomen is soft and nontender. No masses are palpated. No CVA tenderness. No hernias are palpable. No hepatosplenomegaly noted.  Skin: Normal skin turgor, no visible rash and no visible skin lesions.  Neuro/Psych:. Mood and affect are appropriate.    Results/Data Urine COLOR YELLOW  APPEARANCE CLEAR  SPECIFIC GRAVITY 1.020  pH 6.5  GLUCOSE NEG mg/dL BILIRUBIN NEG  KETONE TRACE mg/dL BLOOD NEG  PROTEIN NEG mg/dL UROBILINOGEN 0.2 mg/dL NITRITE NEG  LEUKOCYTE ESTERASE NEG   Assessment Assessed  1. Nephrolithiasis (592.0)  Discussion/Summary   Erin Lopez has a large very dense stone in the left renal pelvis. There is currently minimal obstruction. The stone has been present for quite some time but clearly does need to be treated. Over time the stone is likely to cause some silent obstruction of her kidney and is likely to cause renal damage. Given the density of the stone and its size, I do not think she would do well with lithotripsy. The overall stone burden would be much too large to pass from a fragment standpoint. This is a stone that is much more amenable to percutaneous nephrectomy. We would ask Interventional Radiology to gain access the morning of the surgery and then we will perform percutaneous nephrectomy in the OR. We did discuss that surgery in detail today. I would anticipate at least overnight stay in the hospital. We will set something up hopefully in the next several weeks.

## 2013-08-28 NOTE — Anesthesia Procedure Notes (Signed)
Procedure Name: LMA Insertion Date/Time: 08/28/2013 8:31 AM Performed by: Denna Haggard D Pre-anesthesia Checklist: Patient identified, Emergency Drugs available, Suction available and Patient being monitored Patient Re-evaluated:Patient Re-evaluated prior to inductionOxygen Delivery Method: Circle System Utilized Preoxygenation: Pre-oxygenation with 100% oxygen Intubation Type: IV induction Ventilation: Mask ventilation without difficulty LMA: LMA inserted LMA Size: 4.0 Number of attempts: 1 Airway Equipment and Method: bite block Placement Confirmation: positive ETCO2 Tube secured with: Tape Dental Injury: Teeth and Oropharynx as per pre-operative assessment

## 2013-08-28 NOTE — Interval H&P Note (Signed)
History and Physical Interval Note:  Ms. Jake Shark underwent a percutaneous nephrolithotomy.  Her stone was possibly 95% debulked.  She has some remaining scattered fragments and an indwelling double-J stent.  We'll presents today for double-J stent removal with flexible ureteroscopy to remove any obvious residual fragments.  08/28/2013 8:08 AM  [name]  has presented today for surgery, with the diagnosis of LEFT RENAL CALCULUS  The various methods of treatment have been discussed with the patient and family. After consideration of risks, benefits and other options for treatment, the patient has consented to  Procedure(s) with comments: CYSTOSCOPY WITH URETEROSCOPY AND STENT REMOVAL (Left) -  [id number] HOLMIUM LASER APPLICATION (Left) as a surgical intervention .  The patient's history has been reviewed, patient examined, no change in status, stable for surgery.  I have reviewed the patient's chart and labs.  Questions were answered to the patient's satisfaction.     [name]

## 2013-08-29 ENCOUNTER — Encounter (HOSPITAL_BASED_OUTPATIENT_CLINIC_OR_DEPARTMENT_OTHER): Payer: Self-pay | Admitting: Urology

## 2013-09-12 ENCOUNTER — Other Ambulatory Visit: Payer: Self-pay | Admitting: Family Medicine

## 2013-09-12 NOTE — Telephone Encounter (Signed)
Montelukast RF done.

## 2013-09-12 NOTE — Telephone Encounter (Signed)
Please advise refill for montelukast? It doesn't appear that you have ever filled this before.

## 2013-09-22 ENCOUNTER — Encounter: Payer: Self-pay | Admitting: Family Medicine

## 2013-09-26 ENCOUNTER — Other Ambulatory Visit: Payer: Self-pay | Admitting: Family Medicine

## 2013-10-28 ENCOUNTER — Ambulatory Visit (INDEPENDENT_AMBULATORY_CARE_PROVIDER_SITE_OTHER): Payer: Medicare Other | Admitting: Family Medicine

## 2013-10-28 ENCOUNTER — Encounter: Payer: Self-pay | Admitting: Family Medicine

## 2013-10-28 VITALS — BP 115/75 | HR 68 | Temp 98.9°F | Resp 16 | Ht 66.5 in | Wt 158.0 lb

## 2013-10-28 DIAGNOSIS — K5909 Other constipation: Secondary | ICD-10-CM

## 2013-10-28 DIAGNOSIS — K5900 Constipation, unspecified: Secondary | ICD-10-CM

## 2013-10-28 DIAGNOSIS — R0982 Postnasal drip: Secondary | ICD-10-CM

## 2013-10-28 DIAGNOSIS — N200 Calculus of kidney: Secondary | ICD-10-CM

## 2013-10-28 DIAGNOSIS — I10 Essential (primary) hypertension: Secondary | ICD-10-CM

## 2013-10-28 NOTE — Progress Notes (Signed)
OFFICE NOTE  10/28/2013  CC:  Chief Complaint  Patient presents with  . Follow-up  . Hypertension    goes up and down    HPI: Patient is a 63 y.o. Caucasian female who is here for 3 mo f/u HTN.   Home bp monitoring shows avg <140/80s.  No HA's, vision complaints, CP, or SOB.  Gets fibromyalgia managed by rheum, is on chronic narcotics, has dry mouth and sometimes feels like difficulty swallowing. NO persistent dysphagia, no regurgitation, no odynophagia.  Denies wheezing/cough/ST. Has PND feeling in back of throat a lot.  No fevers.  Pertinent PMH:  Past Medical History  Diagnosis Date  . Hypertension   . Bipolar 1 disorder     sees a psychiatrist  . Anxiety   . Hyperlipidemia     no meds in > a decade per pt  . COPD (chronic obstructive pulmonary disease)   . Fibromyalgia   . Alcoholism in remission     states last alcohol 11 yrs ago  . Vitamin D deficiency 05/2011  . Osteopenia 05/2010    repeat bone densitometry 3-5 yrs  . Memory loss     Abnl MRI brain and CT brain c/w chronic microvascular ischemia  . History of Helicobacter pylori infection     +gastric biopsy (gastritis but no metaplasia, dysplasia, or malignancy identified)  . Environmental allergies   . Shortness of breath   . GERD (gastroesophageal reflux disease)   . Left ureteral calculus 08/2013    Perc nephr--cystoscopy w/ureteroscopy + laser for stone removal  . History of adenomatous polyp of colon     2010  . History of basal cell carcinoma excision     NOSE  . 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 TMJ (temporomandibular joint disorder)     USES  [employer]  . Wears glasses   . Chronic renal insufficiency, stage II (mild)    Past Surgical History  Procedure Laterality Date  . Cervical spine surgery  08-27-1999      C6 -- T1  LAMINECTOMY/  DISKECTOMY  . Total knee arthroplasty Right 2003  . Cesarean section  1994  .  Colonoscopy w/ polypectomy  05/2008    recall 5 yrs  . Esophagogastroduodenoscopy  2010    Candidal esophagitis by gross appearance on EGD, Esoph brushings neg for Candida or malignant cells.  . Nephrolithotomy Left 08/05/2013    Procedure: NEPHROLITHOTOMY PERCUTANEOUS;  Surgeon: [name], MD;  Location: [facility];  Service: Urology;  Laterality: Left;  . Cervical fusion  2002    anterior C5 -- C7  . Anterior cervical decomp/discectomy fusion  06-30-2009    C3 -- C5  AND EXPLORATION OF FUSION C5-7 W/  PLATE REMOVAL  . Tonsillectomy and adenoidectomy  1975  . Negative sleep study  04-01-2007  . Cystoscopy with ureteroscopy and stent placement Left 08/28/2013    Procedure: CYSTOSCOPY, RGP,  WITH URETEROSCOPY AND STENT REMOVAL;  Surgeon: [name], MD;  Location: [facility];  Service: Urology;  Laterality: Left;  . Holmium laser application Left 11/27/4156    Procedure: HOLMIUM LASER APPLICATION;  Surgeon: [name], MD;  Location: [facility];  Service: Urology;  Laterality: Left;    MEDS:  Outpatient Prescriptions Prior to Visit  Medication Sig Dispense Refill  . ALPRAZolam (XANAX) 1 MG tablet Take 1 mg by mouth 3 (three)  times daily.      . B Complex Vitamins (B COMPLEX PO) Take 1 capsule by mouth daily.      . carvedilol (COREG) 12.5 MG tablet Take 12.5 mg by mouth 2 (two) times daily with a meal.      . cetirizine (ZYRTEC) 10 MG tablet Take 10 mg by mouth every morning.       . cyclobenzaprine (FLEXERIL) 10 MG tablet Take 10 mg by mouth 3 (three) times daily as needed for muscle spasms.      . ferrous fumarate (HEMOCYTE - 106 MG FE) 325 (106 FE) MG TABS tablet Take 1 tablet by mouth daily.      Marland Kitchen lamoTRIgine (LAMICTAL) 100 MG tablet Take 300 mg by mouth at bedtime.       Marland Kitchen lisinopril (PRINIVIL,ZESTRIL) 10 MG tablet TAKE 1 TABLET BY MOUTH EVERY DAY  30 tablet  1  . Magnesium 250 MG TABS Take 1 tablet by mouth daily.      . montelukast (SINGULAIR)  10 MG tablet TAKE 1 TABLET BY MOUTH EVERY EVENING  30 tablet  11  . oxyCODONE (ROXICODONE) 15 MG immediate release tablet Take 15 mg by mouth 3 (three) times daily.      . polyethylene glycol powder (GLYCOLAX/MIRALAX) powder 1 capful once daily for constipation  3350 g  3  . PROAIR HFA 108 (90 BASE) MCG/ACT inhaler INHALE 2 PUFFS INTO THE LUNGS EVERY 4 (FOUR) HOURS AS NEEDED FOR WHEEZING OR SHORTNESS OF BREATH.  8.5 each  1  . ranitidine (ZANTAC) 150 MG capsule Take 150 mg by mouth 2 (two) times daily as needed.       . tiotropium (SPIRIVA) 18 MCG inhalation capsule Place 18 mcg into inhaler and inhale every morning.      . venlafaxine XR (EFFEXOR-XR) 150 MG 24 hr capsule Take 150 mg by mouth daily with breakfast.      . zolpidem (AMBIEN) 10 MG tablet Take 10 mg by mouth at bedtime.      Marland Kitchen acetaminophen (TYLENOL) 500 MG tablet Take 500 mg by mouth every 6 (six) hours as needed.      Marland Kitchen ibuprofen (ADVIL,MOTRIN) 200 MG tablet Take 200 mg by mouth every 6 (six) hours as needed.      Marland Kitchen lisinopril (PRINIVIL,ZESTRIL) 10 MG tablet Take 10 mg by mouth at bedtime.      . magnesium hydroxide (MILK OF MAGNESIA) 400 MG/5ML suspension Take by mouth daily as needed for mild constipation.      . montelukast (SINGULAIR) 10 MG tablet Take 10 mg by mouth at bedtime.      . sulfamethoxazole-trimethoprim (BACTRIM DS) 800-160 MG per tablet Take 1 tablet by mouth 2 (two) times daily.  10 tablet  0   No facility-administered medications prior to visit.    PE: Blood pressure 115/75, pulse 68, temperature 98.9 F (37.2 C), temperature source Temporal, resp. rate 16, height 5' 6.5" (1.689 m), weight 158 lb (71.668 kg), SpO2 96.00%. Gen: Alert, well appearing.  Patient is oriented to person, place, time, and situation. LYY:TKPT: no injection, icteris, swelling, or exudate.  EOMI, PERRLA. Mouth: lips without lesion/swelling.  Oral mucosa pink and moist. Oropharynx without erythema, exudate, or swelling.  No further exam  today.  IMPRESSION AND PLAN:  Benign essential HTN The current medical regimen is effective;  continue present plan and medications. Lytes/cr 08/2013 were good.  Constipation, chronic Much improved on daily miralax so we'll continue this.  Postnasal drip Suspect she has  this plus some silent GER/LPR. She does not want to take any additional meds.  She will continue to use OTC nonsedating antihistamines w/out decongestant.  Renal calculus Recently taken care of with a procedure by her urologist.    An After Visit Summary was printed and given to the patient.  FOLLOW UP: 50mo

## 2013-10-28 NOTE — Assessment & Plan Note (Signed)
The current medical regimen is effective;  continue present plan and medications. Lytes/cr 08/2013 were good.

## 2013-10-28 NOTE — Assessment & Plan Note (Signed)
Suspect she has this plus some silent GER/LPR. She does not want to take any additional meds.  She will continue to use OTC nonsedating antihistamines w/out decongestant.

## 2013-10-28 NOTE — Progress Notes (Signed)
Pre visit review using our clinic review tool, if applicable. No additional management support is needed unless otherwise documented below in the visit note. 

## 2013-10-28 NOTE — Assessment & Plan Note (Signed)
Recently taken care of with a procedure by her urologist.

## 2013-10-28 NOTE — Assessment & Plan Note (Signed)
Much improved on daily miralax so we'll continue this.

## 2013-11-21 ENCOUNTER — Other Ambulatory Visit: Payer: Self-pay | Admitting: Family Medicine

## 2013-11-22 ENCOUNTER — Ambulatory Visit (INDEPENDENT_AMBULATORY_CARE_PROVIDER_SITE_OTHER): Payer: Medicare Other | Admitting: Family Medicine

## 2013-11-22 ENCOUNTER — Encounter: Payer: Self-pay | Admitting: Family Medicine

## 2013-11-22 VITALS — BP 137/90 | HR 88 | Temp 99.4°F | Resp 18 | Ht 66.5 in | Wt 156.0 lb

## 2013-11-22 DIAGNOSIS — G8929 Other chronic pain: Secondary | ICD-10-CM

## 2013-11-22 DIAGNOSIS — R202 Paresthesia of skin: Secondary | ICD-10-CM

## 2013-11-22 DIAGNOSIS — M542 Cervicalgia: Secondary | ICD-10-CM

## 2013-11-22 DIAGNOSIS — IMO0001 Reserved for inherently not codable concepts without codable children: Secondary | ICD-10-CM

## 2013-11-22 DIAGNOSIS — M797 Fibromyalgia: Secondary | ICD-10-CM

## 2013-11-22 DIAGNOSIS — R209 Unspecified disturbances of skin sensation: Secondary | ICD-10-CM

## 2013-11-22 MED ORDER — LISINOPRIL 20 MG PO TABS: 20.0000 mg | ORAL_TABLET | Freq: Every day | ORAL | Status: DC

## 2013-11-22 NOTE — Progress Notes (Signed)
Pre visit review using our clinic review tool, if applicable. No additional management support is needed unless otherwise documented below in the visit note. 

## 2013-11-22 NOTE — Patient Instructions (Signed)
Call in 7-10 days with report of your blood pressure measurements.

## 2013-11-22 NOTE — Progress Notes (Signed)
OFFICE NOTE  11/22/2013  CC:  Chief Complaint  Patient presents with  . Hypertension  . Neck Pain    Referral for Dr. Jannifer Franklin  . Results    ct scan      HPI: Patient is a 63 y.o. Caucasian female who is here for recent elevated bp's. She is in more pain lately in left side of neck and left top of shoulder, goes all the way to the top of the head "into my brain". She has lowered her oxycodone dose from 15mg  to 10mg  b/c she is tired of the med.  She is crying today, frustrated. BPs as high as 170s/90.  Avg 299B systolics for the last week.   She requests new pain mgmt md mainly for convenience b/c current pain clinic is [place] away [place]. She says she has been in pain all her life, dx'd with fibromyalgia and when I ask about any back/neck dx's she simply says "yes, everything is setting in" but I can't get anything more specific.  Pertinent PMH:  Past medical, surgical, social, and family history reviewed and no changes are noted since last office visit.  MEDS:  Outpatient Prescriptions Prior to Visit  Medication Sig Dispense Refill  . ALPRAZolam (XANAX) 1 MG tablet Take 1 mg by mouth 3 (three) times daily.      . carvedilol (COREG) 12.5 MG tablet Take 12.5 mg by mouth 2 (two) times daily with a meal.      . cetirizine (ZYRTEC) 10 MG tablet Take 10 mg by mouth every morning.       . cyclobenzaprine (FLEXERIL) 10 MG tablet Take 10 mg by mouth 3 (three) times daily as needed for muscle spasms.      . ferrous fumarate (HEMOCYTE - 106 MG FE) 325 (106 FE) MG TABS tablet Take 1 tablet by mouth daily.      Marland Kitchen lamoTRIgine (LAMICTAL) 100 MG tablet Take 300 mg by mouth at bedtime.       Marland Kitchen lisinopril (PRINIVIL,ZESTRIL) 10 MG tablet TAKE 1 TABLET BY MOUTH EVERY DAY  30 tablet  1  . montelukast (SINGULAIR) 10 MG tablet TAKE 1 TABLET BY MOUTH EVERY EVENING  30 tablet  11  . oxyCODONE (ROXICODONE) 15 MG immediate release tablet Take 15 mg by mouth 3 (three) times daily.      . polyethylene  glycol powder (GLYCOLAX/MIRALAX) powder 1 capful once daily for constipation  3350 g  3  . PROAIR HFA 108 (90 BASE) MCG/ACT inhaler INHALE 2 PUFFS INTO THE LUNGS EVERY 4 (FOUR) HOURS AS NEEDED FOR WHEEZING OR SHORTNESS OF BREATH.  8.5 each  1  . tiotropium (SPIRIVA) 18 MCG inhalation capsule Place 18 mcg into inhaler and inhale every morning.      . venlafaxine XR (EFFEXOR-XR) 150 MG 24 hr capsule Take 150 mg by mouth daily with breakfast.      . zolpidem (AMBIEN) 10 MG tablet Take 10 mg by mouth at bedtime.      . B Complex Vitamins (B COMPLEX PO) Take 1 capsule by mouth daily.      . Magnesium 250 MG TABS Take 1 tablet by mouth daily.      . ranitidine (ZANTAC) 150 MG capsule Take 150 mg by mouth 2 (two) times daily as needed.        No facility-administered medications prior to visit.    PE: Blood pressure 137/90, pulse 88, temperature 99.4 F (37.4 C), temperature source Temporal, resp. rate 18, height  5' 6.5" (1.689 m), weight 156 lb (70.761 kg), SpO2 98.00%. Gen: Alert, well appearing.  Patient is oriented to person, place, time, and situation. AFFECT: anxious, started crying in frustration when discussing her pain situation. CV: RRR, no m/r/g.   LUNGS: CTA bilat, nonlabored resps, good aeration in all lung fields.   IMPRESSION AND PLAN:  1) HTN, uncontrolled with recent acute pain exacerbation/decrease in pain med dosing:  Increase lisinopril to 20mg  qd.  Call in 7-10d to report BPs.  Discussed possible need for further up-titration.  2) Chronic pain syndrome: she cites decades of full body pain, but seems to be most focused on neck pain with also an odd hx of recurrent facial paresthesias (bilat).  She has seen Dr. Jannifer Franklin in neuro in the past and asks for referral back to him since it has been > 3 yrs since last visit with him.  3) Pt also requested referral to new pain mgmt clinic for location convenience to her home.  Currently managed by Dr. Barkley Boards in [place] pain clinic.  Pt is  frustrated, interested in ways to help her pain besides narcotics if possible.  If no other options, she simply wants help with med use and possibly even help with getting off of narcotics altogether.  FOLLOW UP:  Prn (to be determined based on pt's report of home bp's over the next 7-10d)

## 2013-12-12 ENCOUNTER — Institutional Professional Consult (permissible substitution): Payer: Self-pay | Admitting: Neurology

## 2013-12-17 ENCOUNTER — Telehealth: Payer: Self-pay | Admitting: Family Medicine

## 2013-12-17 NOTE — Telephone Encounter (Signed)
Patient bp readings were 12-16-13 135/84, 12-13-13 144/94, 12/12/13 151/96. Bp readings have been steady at 130-157 88-96. Patient is going to the neurologist & is declining pain management. She is going to call her rheumotologist in [place] & ask for a referral to a provider in [place].

## 2013-12-17 NOTE — Telephone Encounter (Signed)
Please advise 

## 2013-12-18 MED ORDER — LISINOPRIL 30 MG PO TABS: 30.0000 mg | ORAL_TABLET | Freq: Every day | ORAL | Status: DC

## 2013-12-18 NOTE — Telephone Encounter (Signed)
LMOM for pt to CB.  

## 2013-12-18 NOTE — Telephone Encounter (Signed)
I sent in new rx for an increased dose of lisinopril (30 mg once daily). She can finish up her current 20mg  tabs at 1 and 1/2 tabs a day prior to picking up this new rx. -thx

## 2013-12-19 ENCOUNTER — Ambulatory Visit (INDEPENDENT_AMBULATORY_CARE_PROVIDER_SITE_OTHER): Payer: Medicare Other | Admitting: Neurology

## 2013-12-19 ENCOUNTER — Encounter: Payer: Self-pay | Admitting: Neurology

## 2013-12-19 VITALS — BP 147/93 | HR 77 | Ht 67.5 in | Wt 154.0 lb

## 2013-12-19 DIAGNOSIS — S139XXA Sprain of joints and ligaments of unspecified parts of neck, initial encounter: Secondary | ICD-10-CM

## 2013-12-19 DIAGNOSIS — M47812 Spondylosis without myelopathy or radiculopathy, cervical region: Secondary | ICD-10-CM | POA: Insufficient documentation

## 2013-12-19 DIAGNOSIS — R413 Other amnesia: Secondary | ICD-10-CM | POA: Insufficient documentation

## 2013-12-19 DIAGNOSIS — S139XXS Sprain of joints and ligaments of unspecified parts of neck, sequela: Secondary | ICD-10-CM

## 2013-12-19 HISTORY — DX: Sprain of joints and ligaments of unspecified parts of neck, initial encounter: S13.9XXA

## 2013-12-19 HISTORY — DX: Spondylosis without myelopathy or radiculopathy, cervical region: M47.812

## 2013-12-19 MED ORDER — GABAPENTIN 100 MG PO CAPS: ORAL_CAPSULE | ORAL | Status: DC

## 2013-12-19 NOTE — Telephone Encounter (Signed)
Pt aware of new medication instructions.

## 2013-12-19 NOTE — Progress Notes (Signed)
Reason for visit: Neck discomfort  Erin Lopez is a 63 y.o. female  History of present illness:  Erin Lopez she is a 63 year old right-handed white female with a history of bipolar disorder. The patient was seen 5 years ago with concerns about her memory. The patient was found to have an abnormal MRI of the brain, and evidence of cervical spondylosis at that time. She underwent a lumbar puncture for a demyelinating disease workup that was unremarkable. The patient eventually went on to have surgery for cord compression at the C4-5 level, and she has had a fusion as well at the C5 through C7 level. The patient had been doing well until approximately one year ago, when she noted increased pain involving the neck and shoulder. This has gradually worsened over time, and the patient has noted increased stiffness of the neck, and decreased mobility of the neck. She reports pain into the shoulders bilaterally, and some headaches associated with this pain. The patient has some slight tingling occasionally into the lower face on the right, and she denies any numbness or weakness of the arms or legs. She denies troubles with bowel or bladder control, but she does have some slight gait instability. The patient has been taking some Flexeril, but she indicates that this did not help. The patient is on oxycodone at times. She has been on Lyrica in the past, but she indicates that she did not tolerate this as it made her too drowsy. The patient is sent to this office for further evaluation. The patient continues to note problems with memory and word finding problems.  Past Medical History  Diagnosis Date  . Hypertension   . Bipolar 1 disorder     sees a psychiatrist  . Anxiety   . Hyperlipidemia     no meds in > a decade per pt  . COPD (chronic obstructive pulmonary disease)   . Fibromyalgia   . Alcoholism in remission     states last alcohol 11 yrs ago  . Vitamin D deficiency 05/2011  . Osteopenia  05/2010    repeat bone densitometry 3-5 yrs  . Memory loss     Abnl MRI brain and CT brain c/w chronic microvascular ischemia  . History of Helicobacter pylori infection     +gastric biopsy (gastritis but no metaplasia, dysplasia, or malignancy identified)  . Environmental allergies   . Shortness of breath   . GERD (gastroesophageal reflux disease)   . Left ureteral calculus 08/2013    Perc nephr--cystoscopy w/ureteroscopy + laser for stone removal  . History of adenomatous polyp of colon     2010  . History of basal cell carcinoma excision     NOSE  . 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 TMJ (temporomandibular joint disorder)     USES  [employer]  . Wears glasses   . Chronic renal insufficiency, stage II (mild)   . Cervical spondylosis without myelopathy 12/19/2013  . Sprain of neck 12/19/2013  . Memory difficulties 12/19/2013    Past Surgical History  Procedure Laterality Date  . Cervical spine surgery  08-27-1999      C6 -- T1  LAMINECTOMY/  DISKECTOMY  . Total knee arthroplasty Right 2003  . Cesarean section  1994  . Colonoscopy w/ polypectomy  05/2008    recall 5 yrs  . Esophagogastroduodenoscopy  2010    Candidal esophagitis by  gross appearance on EGD, Esoph brushings neg for Candida or malignant cells.  . Nephrolithotomy Left 08/05/2013    Procedure: NEPHROLITHOTOMY PERCUTANEOUS;  Surgeon: [name], MD;  Location: [facility];  Service: Urology;  Laterality: Left;  . Cervical fusion  2002    anterior C5 -- C7  . Anterior cervical decomp/discectomy fusion  06-30-2009    C3 -- C5  AND EXPLORATION OF FUSION C5-7 W/  PLATE REMOVAL  . Tonsillectomy and adenoidectomy  1975  . Negative sleep study  04-01-2007  . Cystoscopy with ureteroscopy and stent placement Left 08/28/2013    Procedure: CYSTOSCOPY, RGP,  WITH URETEROSCOPY AND STENT REMOVAL;  Surgeon: [name], MD;  Location: [facility];  Service: Urology;  Laterality: Left;  . Holmium laser application Left 07/11/8117    Procedure: HOLMIUM LASER APPLICATION;  Surgeon: [name], MD;  Location: [facility];  Service: Urology;  Laterality: Left;    Family History  Problem Relation Age of Onset  . Alcohol abuse Mother   . Arthritis Mother   . Hypertension Mother   . Hyperlipidemia Mother   . Diabetes Mother   . Parkinsonism Mother   . Cancer Father     colon  . Prostate cancer Father   . Hypertension Father   . Hyperlipidemia Father   . Diabetes Father     Social history:  reports that she quit smoking about 20 years ago. Her smoking use included Cigarettes. She has a 33 pack-year smoking history. She has never used smokeless tobacco. She reports that she does not drink alcohol or use illicit drugs.  Medications:  Current Outpatient Prescriptions on File Prior to Visit  Medication Sig Dispense Refill  . ALPRAZolam (XANAX) 1 MG tablet Take 5 mg by mouth 2 (two) times daily.       . carvedilol (COREG) 12.5 MG tablet Take 12.5 mg by mouth 2 (two) times daily with a meal.      . cetirizine (ZYRTEC) 10 MG tablet Take 10 mg by mouth every morning.       . ferrous fumarate (HEMOCYTE - 106 MG FE) 325 (106 FE) MG TABS tablet Take 1 tablet by mouth daily.      Marland Kitchen lamoTRIgine (LAMICTAL) 100 MG tablet Take 300 mg by mouth at bedtime.       Marland Kitchen lisinopril (PRINIVIL,ZESTRIL) 30 MG tablet Take 1 tablet (30 mg total) by mouth daily.  30 tablet  6  . montelukast (SINGULAIR) 10 MG tablet TAKE 1 TABLET BY MOUTH EVERY EVENING  30 tablet  11  . oxyCODONE (ROXICODONE) 15 MG immediate release tablet Take 15 mg by mouth 3 (three) times daily.      . polyethylene glycol powder (GLYCOLAX/MIRALAX) powder 1 capful once daily for constipation  3350 g  3  . PROAIR HFA 108 (90 BASE) MCG/ACT inhaler INHALE 2 PUFFS INTO THE LUNGS EVERY 4 (FOUR) HOURS AS NEEDED FOR WHEEZING OR SHORTNESS OF BREATH.  8.5 each  1  .  tiotropium (SPIRIVA) 18 MCG inhalation capsule Place 18 mcg into inhaler and inhale every morning.      . venlafaxine XR (EFFEXOR-XR) 150 MG 24 hr capsule Take 150 mg by mouth daily with breakfast.      . zolpidem (AMBIEN) 10 MG tablet Take 10 mg by mouth at bedtime.      . B Complex Vitamins (B COMPLEX PO) Take 1 capsule by mouth daily.      . Magnesium 250 MG  TABS Take 1 tablet by mouth daily.      . ranitidine (ZANTAC) 150 MG capsule Take 150 mg by mouth 2 (two) times daily as needed.        No current facility-administered medications on file prior to visit.      Allergies  Allergen Reactions  . Ceftin [Cefuroxime Axetil] Anaphylaxis    Swelling of eyes and tongue  . Morphine And Related Other (See Comments)    Hallucinations/ disoriented  . Prednisone Other (See Comments)    Hyperactivity    ROS:  Out of a complete 14 system review of symptoms, the patient complains only of the following symptoms, and all other reviewed systems are negative.  Fatigue  Hearing loss, ringing in the ears, dizziness  Blurred vision Snoring Anemia Feeling hot Allergies Memory loss, headache, slurred speech, difficulty swallowing Depression, anxiety, racing thoughts Restless legs  Blood pressure 147/93, pulse 77, height 5' 7.5" (1.715 m), weight 154 lb (69.854 kg).  Physical Exam  General: The patient is alert and cooperative at the time of the examination.  Eyes: Pupils are equal, round, and reactive to light. Discs are flat bilaterally.  Neck: The neck is supple, no carotid bruits are noted.  Respiratory: The respiratory examination is clear.  Cardiovascular: The cardiovascular examination reveals a regular rate and rhythm, no obvious murmurs or rubs are noted.  Neuromuscular: The patient is only able to generate only about 20 of lateral rotation of the cervical spine bilaterally.  Skin: Extremities are without significant edema.  Neurologic Exam  Mental status: The patient  is alert and oriented x 3 at the time of the examination. The patient has apparent normal recent and remote memory, with an apparently normal attention span and concentration ability. Mini-Mental status examination done today shows a total score of 24/30.   Cranial nerves: Facial symmetry is present. There is good sensation of the face to pinprick and soft touch bilaterally. The strength of the facial muscles and the muscles to head turning and shoulder shrug are normal bilaterally. Speech is well enunciated, no aphasia or dysarthria is noted. Extraocular movements are full. Visual fields are full. The tongue is midline, and the patient has symmetric elevation of the soft palate. No obvious hearing deficits are noted.  Motor: The motor testing reveals 5 over 5 strength of all 4 extremities. Good symmetric motor tone is noted throughout.  Sensory: Sensory testing is intact to pinprick, soft touch, vibration sensation, and position sense on all 4 extremities. No evidence of extinction is noted.  Coordination: Cerebellar testing reveals good finger-nose-finger and heel-to-shin bilaterally.  Gait and station: Gait is normal. Tandem gait is normal. Romberg is negative. No drift is seen.  Reflexes: Deep tendon reflexes are symmetric and normal bilaterally. Toes are downgoing bilaterally.    MRI cervical 03/18/09:  Impression: Abnormal MRI cervical spine (without contrast) demonstrating: 1. At C4-5 there is moderate spinal stenosis (AP diameter of cord is 3mm) and severe biforaminal stenosis due to posterior pseduo-disc protrusion, posterior subluxation or C4 on C5, and facet hypertrophy.  There are no definite cord signal abnormalities. 2. At C3-4 there is moderate to severe biforaminal stenosis due to disc bulging and uncovertebral joint hypertrophy. 3. Anterior cervical interbody fusion of C5, C6 and C7.  There is a posterior left laminectomy of C7. 4. In comparison to the prior MRI from 07/10/08,  the degree of spinal stenosis at C4-5 has slightly increased in the current study.    MRI brain 03/18/09:  Impression:  Abnormal MRI brain (without contrast) demonstrating: 1. Multiple, non-specific supratentorial white matter lesions.  These may represent chronic microvacular ischemic disease or chronic demyelinating plaques. 2. In comparison to the prior MRI brain from 07/10/08, there is no significant interval change.   Assessment/Plan:  One. Cervical spondylosis  2. Reported memory disturbance  The patient has reported an increase in cervical pain, decreased mobility of the neck within the last year. She continues to have reports of memory problems. The patient will be set up for neuromuscular therapy, and she will be placed on low-dose gabapentin. She will followup through this office in about 3 months. The gabapentin may need to be increased if it is tolerated, but not effective. She will contact our office if any issues arise. In the future, MRI of the cervical spine, and a repeat MRI of the brain may be done.   Jill Alexanders MD 12/19/2013 8:44 PM  [facility] [address] [place], [place] [address]  Phone [phone] Fax [phone]

## 2013-12-19 NOTE — Patient Instructions (Signed)
Cervical Sprain A cervical sprain is when the tissues (ligaments) that hold the neck bones in place stretch or tear. HOME CARE   Put ice on the injured area.  Put ice in a plastic bag.  Place a towel between your skin and the bag.  Leave the ice on for 15-20 minutes, 3-4 times a day.  You may have been given a collar to wear. This collar keeps your neck from moving while you heal.  Do not take the collar off unless told by your doctor.  If you have long hair, keep it outside of the collar.  Ask your doctor before changing the position of your collar. You may need to change its position over time to make it more comfortable.  If you are allowed to take off the collar for cleaning or bathing, follow your doctor's instructions on how to do it safely.  Keep your collar clean by wiping it with mild soap and water. Dry it completely. If the collar has removable pads, remove them every 1-2 days to hand wash them with soap and water. Allow them to air dry. They should be dry before you wear them in the collar.  Do not drive while wearing the collar.  Only take medicine as told by your doctor.  Keep all doctor visits as told.  Keep all physical therapy visits as told.  Adjust your work station so that you have good posture while you work.  Avoid positions and activities that make your problems worse.  Warm up and stretch before being active. GET HELP IF:  Your pain is not controlled with medicine.  You cannot take less pain medicine over time as planned.  Your activity level does not improve as expected. GET HELP RIGHT AWAY IF:   You are bleeding.  Your stomach is upset.  You have an allergic reaction to your medicine.  You develop new problems that you cannot explain.  You lose feeling (become numb) or you cannot move any part of your body (paralysis).  You have tingling or weakness in any part of your body.  Your symptoms get worse. Symptoms include:  Pain,  soreness, stiffness, puffiness (swelling), or a burning feeling in your neck.  Pain when your neck is touched.  Shoulder or upper back pain.  Limited ability to move your neck.  Headache.  Dizziness.  Your hands or arms feel week, lose feeling, or tingle.  Muscle spasms.  Difficulty swallowing or chewing. MAKE SURE YOU:   Understand these instructions.  Will watch your condition.  Will get help right away if you are not doing well or get worse. Document Released: 09/07/2007 Document Revised: 11/21/2012 Document Reviewed: 09/26/2012 Exit[employer] Patient Information 2015 [employer]. This information is not intended to replace advice given to you by your health care provider. Make sure you discuss any questions you have with your health care provider.  

## 2014-01-01 ENCOUNTER — Telehealth: Payer: Self-pay | Admitting: Neurology

## 2014-01-01 NOTE — Telephone Encounter (Signed)
I called patient. The patient was seen recently complaining of increased neck stiffness, increased neck and shoulder discomfort. The patient was sent for physical therapy, and apparently told the therapist that her issue is more with low back and the hips, not the neck. I called the patient, left a message. If the information concerning the neck pain, and stiffness that we have discussed and a revisit is not correct, she is to call our office and discuss this with me.

## 2014-01-02 ENCOUNTER — Encounter: Payer: Self-pay | Admitting: Family Medicine

## 2014-01-06 ENCOUNTER — Telehealth: Payer: Self-pay | Admitting: Neurology

## 2014-01-06 DIAGNOSIS — R9089 Other abnormal findings on diagnostic imaging of central nervous system: Secondary | ICD-10-CM

## 2014-01-06 DIAGNOSIS — M47812 Spondylosis without myelopathy or radiculopathy, cervical region: Secondary | ICD-10-CM

## 2014-01-06 DIAGNOSIS — R413 Other amnesia: Secondary | ICD-10-CM

## 2014-01-06 NOTE — Telephone Encounter (Signed)
I called the patient. Left a message. I will call back later.

## 2014-01-06 NOTE — Telephone Encounter (Signed)
Patient calling to state that she wants to discuss her last MRI results, please return call and advise.

## 2014-01-06 NOTE — Telephone Encounter (Signed)
I called the patient again and I left a message. i will call back in the morning.

## 2014-01-06 NOTE — Telephone Encounter (Signed)
See phone note

## 2014-01-07 NOTE — Telephone Encounter (Signed)
I called patient. The patient believes that there has been some progression of memory. She has had abnormal findings on MRI in 2010, we'll recheck the study to exclude the possibility of demyelinating disease versus small vessel disease. Secondary to ongoing cervical discomfort, we will also repeat the MRI of the cervical spine. The patient did have a CT scan of the head in February 2015, this study was relatively unremarkable.

## 2014-01-23 ENCOUNTER — Other Ambulatory Visit: Payer: Medicare Other

## 2014-02-03 ENCOUNTER — Telehealth: Payer: Self-pay | Admitting: Family Medicine

## 2014-02-03 DIAGNOSIS — Z86010 Personal history of colonic polyps: Secondary | ICD-10-CM

## 2014-02-03 DIAGNOSIS — Z860101 Personal history of adenomatous and serrated colon polyps: Secondary | ICD-10-CM

## 2014-02-03 NOTE — Telephone Encounter (Signed)
Please advise 

## 2014-02-03 NOTE — Telephone Encounter (Signed)
Pt called and asked for [name] to send in a referral for a colonoscopy and Endoscopy. She said she received a letter from [facility] stating it was time for these two tests.Shelby Mattocks

## 2014-02-03 NOTE — Telephone Encounter (Signed)
She is established with them and got her initial EGD/colonoscopies with them so she just needs to call their office for appt--no referral is needed. ??

## 2014-02-04 ENCOUNTER — Ambulatory Visit
Admission: RE | Admit: 2014-02-04 | Discharge: 2014-02-04 | Disposition: A | Discharge status: Home / Self Care | Payer: Medicare Other | Source: Ambulatory Visit | Attending: Neurology | Admitting: Neurology

## 2014-02-04 DIAGNOSIS — M47812 Spondylosis without myelopathy or radiculopathy, cervical region: Secondary | ICD-10-CM

## 2014-02-04 DIAGNOSIS — R413 Other amnesia: Secondary | ICD-10-CM

## 2014-02-04 DIAGNOSIS — R9089 Other abnormal findings on diagnostic imaging of central nervous system: Secondary | ICD-10-CM

## 2014-02-04 NOTE — Telephone Encounter (Signed)
No referral is needed 

## 2014-02-05 ENCOUNTER — Telehealth: Payer: Self-pay | Admitting: Neurology

## 2014-02-05 NOTE — Telephone Encounter (Signed)
I called patient. The MRI of the brain shows no change from 5 years ago. MRI of the cervical spine shows good decompression of the spinal cord, no cord lesions seen, no evidence of demyelinating disease is noted.   MRI cervical spine 02/05/2014:  IMPRESSION:  Abnormal MRI cervical spine (without) demonstrating: 1. At C7-T1: leftward disc bulging with severe left foraminal stenosis  2. At C4-5: uncovertebral joint hypertrophy with moderate right foraminal stenosis  3. At C6-7: left posterior decompression; no spinal stenosis or foraminal narrowing  4. Compared to MRI on 03/18/09, there has been hardware removal from C5-C6-C7 and new hardware at C3-C4-C5. Also foraminal stenosis at C7-T1 is worse on the left.

## 2014-02-05 NOTE — Telephone Encounter (Signed)
    MRI brain 02/05/2014:  IMPRESSION:  Abnormal MRI brain (without) demonstrating: 1. Scattered round and ovoid, periventricular and subcortical foci of T2 hyperintensities. These findings are non-specific and considerations include autoimmune, inflammatory, post-infectious, microvascular ischemic or migraine associated etiologies.  2. No acute findings. 3. No change from MRI on 03/18/09.    MRI cervical 02/05/14:

## 2014-02-12 NOTE — Telephone Encounter (Signed)
Order entered for [facility] referral.

## 2014-02-12 NOTE — Telephone Encounter (Signed)
Erin Lopez states pt doesn't want to go back to [facility]. She wants to go to [place] so she does need a referral put in.

## 2014-02-12 NOTE — Telephone Encounter (Signed)
Patient would like to switch to [facility]. Please enter a referral for colonoscopy & endoscopy.

## 2014-02-18 ENCOUNTER — Encounter: Payer: Self-pay | Admitting: Family Medicine

## 2014-03-05 ENCOUNTER — Encounter: Payer: Self-pay | Admitting: Internal Medicine

## 2014-03-17 ENCOUNTER — Ambulatory Visit: Payer: Medicare Other | Admitting: Rehabilitative and Restorative Service Providers"

## 2014-04-22 ENCOUNTER — Encounter: Payer: Self-pay | Admitting: *Deleted

## 2014-04-28 ENCOUNTER — Ambulatory Visit: Payer: Medicare Other | Admitting: Internal Medicine

## 2014-04-29 ENCOUNTER — Encounter: Payer: Self-pay | Admitting: Family Medicine

## 2014-04-29 ENCOUNTER — Ambulatory Visit (INDEPENDENT_AMBULATORY_CARE_PROVIDER_SITE_OTHER): Payer: Commercial Managed Care - HMO | Admitting: Family Medicine

## 2014-04-29 ENCOUNTER — Encounter: Payer: Medicare Other | Admitting: Family Medicine

## 2014-04-29 VITALS — BP 133/83 | HR 69 | Temp 99.2°F | Resp 18 | Ht 66.5 in | Wt 166.0 lb

## 2014-04-29 DIAGNOSIS — B370 Candidal stomatitis: Secondary | ICD-10-CM

## 2014-04-29 DIAGNOSIS — D649 Anemia, unspecified: Secondary | ICD-10-CM

## 2014-04-29 DIAGNOSIS — Z0000 Encounter for general adult medical examination without abnormal findings: Secondary | ICD-10-CM | POA: Diagnosis not present

## 2014-04-29 DIAGNOSIS — Z86010 Personal history of colonic polyps: Secondary | ICD-10-CM

## 2014-04-29 DIAGNOSIS — M8580 Other specified disorders of bone density and structure, unspecified site: Secondary | ICD-10-CM

## 2014-04-29 DIAGNOSIS — D72819 Decreased white blood cell count, unspecified: Secondary | ICD-10-CM

## 2014-04-29 LAB — CBC WITH DIFFERENTIAL/PLATELET
Basophils Absolute: 0 10*3/uL (ref 0.0–0.1)
Basophils Relative: 0.4 % (ref 0.0–3.0)
EOS ABS: 0.2 10*3/uL (ref 0.0–0.7)
EOS PCT: 2.6 % (ref 0.0–5.0)
HCT: 39.5 % (ref 36.0–46.0)
Hemoglobin: 13.3 g/dL (ref 12.0–15.0)
LYMPHS ABS: 1.6 10*3/uL (ref 0.7–4.0)
Lymphocytes Relative: 25.5 % (ref 12.0–46.0)
MCHC: 33.5 g/dL (ref 30.0–36.0)
MCV: 88.5 fl (ref 78.0–100.0)
MONOS PCT: 7 % (ref 3.0–12.0)
Monocytes Absolute: 0.4 10*3/uL (ref 0.1–1.0)
NEUTROS ABS: 4 10*3/uL (ref 1.4–7.7)
Neutrophils Relative %: 64.5 % (ref 43.0–77.0)
PLATELETS: 265 10*3/uL (ref 150.0–400.0)
RBC: 4.47 Mil/uL (ref 3.87–5.11)
RDW: 13.1 % (ref 11.5–15.5)
WBC: 6.2 10*3/uL (ref 4.0–10.5)

## 2014-04-29 LAB — VITAMIN B12: Vitamin B-12: 607 pg/mL (ref 211–911)

## 2014-04-29 LAB — TSH: TSH: 0.76 u[IU]/mL (ref 0.35–4.50)

## 2014-04-29 LAB — FERRITIN: Ferritin: 27.6 ng/mL (ref 10.0–291.0)

## 2014-04-29 MED ORDER — NYSTATIN 100000 UNIT/ML MT SUSP: OROMUCOSAL | Status: DC
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
## 2014-04-29 NOTE — Progress Notes (Signed)
Pre visit review using our clinic review tool, if applicable. No additional management support is needed unless otherwise documented below in the visit note. 

## 2014-04-30 ENCOUNTER — Ambulatory Visit: Payer: 59 | Admitting: Neurology

## 2014-04-30 ENCOUNTER — Encounter: Payer: Self-pay | Admitting: Family Medicine

## 2014-04-30 LAB — COMPREHENSIVE METABOLIC PANEL
ALBUMIN: 4.7 g/dL (ref 3.5–5.2)
ALT: 13 U/L (ref 0–35)
AST: 17 U/L (ref 0–37)
Alkaline Phosphatase: 53 U/L (ref 39–117)
BUN: 12 mg/dL (ref 6–23)
CO2: 29 mEq/L (ref 19–32)
Calcium: 9.7 mg/dL (ref 8.4–10.5)
Chloride: 103 mEq/L (ref 96–112)
Creatinine, Ser: 0.93 mg/dL (ref 0.40–1.20)
GFR: 65.07 mL/min (ref >60.00)
GLUCOSE: 88 mg/dL (ref 70–99)
POTASSIUM: 4.2 meq/L (ref 3.5–5.1)
Sodium: 139 mEq/L (ref 135–145)
Total Bilirubin: 0.6 mg/dL (ref 0.2–1.2)
Total Protein: 7 g/dL (ref 6.0–8.3)

## 2014-04-30 LAB — LIPID PANEL
CHOLESTEROL: 232 mg/dL — AB (ref 0–200)
HDL: 63.8 mg/dL (ref >39.00)
LDL Cholesterol: 147 mg/dL — ABNORMAL HIGH (ref 0–99)
NonHDL: 168.2
TRIGLYCERIDES: 105 mg/dL (ref 0.0–149.0)
Total CHOL/HDL Ratio: 4
VLDL: 21 mg/dL (ref 0.0–40.0)

## 2014-04-30 LAB — IBC PANEL
Iron: 84 ug/dL (ref 42–145)
Saturation Ratios: 25.9 % (ref 20.0–50.0)
Transferrin: 232 mg/dL (ref 212.0–360.0)

## 2014-05-15 ENCOUNTER — Other Ambulatory Visit: Payer: Self-pay | Admitting: Neurology

## 2014-05-23 ENCOUNTER — Telehealth: Payer: Self-pay | Admitting: Family Medicine

## 2014-05-23 DIAGNOSIS — M2550 Pain in unspecified joint: Secondary | ICD-10-CM

## 2014-05-23 DIAGNOSIS — M791 Myalgia, unspecified site: Secondary | ICD-10-CM

## 2014-05-23 NOTE — Telephone Encounter (Signed)
Patient needs a referral to [facility] Rheumotology Dr. Barkley Boards ph [phone] fax [phone]

## 2014-05-23 NOTE — Telephone Encounter (Signed)
Please advise 

## 2014-05-25 NOTE — Telephone Encounter (Signed)
Referral to rheum ordered as per pt request.

## 2014-05-26 NOTE — Telephone Encounter (Signed)
Thank you :)

## 2014-06-18 ENCOUNTER — Encounter: Payer: Self-pay | Admitting: Internal Medicine

## 2014-06-18 ENCOUNTER — Ambulatory Visit (INDEPENDENT_AMBULATORY_CARE_PROVIDER_SITE_OTHER): Payer: Commercial Managed Care - HMO | Admitting: Internal Medicine

## 2014-06-18 VITALS — BP 120/68 | HR 68 | Ht 66.5 in | Wt 163.8 lb

## 2014-06-18 DIAGNOSIS — Z86010 Personal history of colonic polyps: Secondary | ICD-10-CM

## 2014-06-18 DIAGNOSIS — Z8619 Personal history of other infectious and parasitic diseases: Secondary | ICD-10-CM

## 2014-06-18 DIAGNOSIS — R1310 Dysphagia, unspecified: Secondary | ICD-10-CM

## 2014-06-18 DIAGNOSIS — B3781 Candidal esophagitis: Secondary | ICD-10-CM

## 2014-06-18 DIAGNOSIS — K5900 Constipation, unspecified: Secondary | ICD-10-CM

## 2014-06-18 MED ORDER — FLUCONAZOLE 100 MG PO TABS: ORAL_TABLET | ORAL | Status: DC

## 2014-06-18 NOTE — Patient Instructions (Signed)
You have been scheduled for an endoscopy and colonoscopy. Please follow the written instructions given to you at your visit today. Please pick up your prep supplies at the pharmacy within the next 1-3 days. If you use inhalers (even only as needed), please bring them with you on the day of your procedure. Your physician has requested that you go to www.startemmi.com and enter the access code given to you at your visit today. This web site gives a general overview about your procedure. However, you should still follow specific instructions given to you by our office regarding your preparation for the procedure.  We have sent the following medications to your pharmacy for you to pick up at your convenience: Diflucan 2 tablets on day 1 then 1 tablet daily x 13 days thereafter  Please continue Miralax daily.  Make certain to swish your mouth out with water after inhaler use.

## 2014-06-18 NOTE — Progress Notes (Signed)
Patient ID: Erin Lopez, female   DOB: 1950-08-29, 64 y.o.   MRN: [id number] HPI: Erin Lopez is a 64 year old female with a past medical history of alcoholism in remission, H. pylori status post treatment, adenomatous colon polyps, bipolar disorder, hypertension, fibromyalgia, chronic renal insufficiency who is seen in consultation at the request of [name] to evaluate reflux and history of colon polyps. She's here alone today. There was also question of a normocytic anemia another reason to ensure she is up-to-date with GI screening tests.  She reports that overall she is feeling well. She occasionally has left lower quadrant cramping abdominal pain which comes and goes. She uses MiraLAX 17 g daily to help prevent constipation. She does have a history of constipation in the setting Roxicodone. She reports overall heartburn has been controlled with ranitidine 150 mg twice daily. She does report trouble with food sticking with swallowing. Occasionally painful swallowing. She denies early satiety or weight loss. Denies seeing visible blood in her stool or melena. She does have a history of upper and lower endoscopy.  Colonoscopy performed 04/24/2008 by [name] for history of colon polyps --This revealed two 4-6 mm polyps in the sigmoid and descending. Sigmoid diverticulosis. Path results tubular adenoma and hyperplastic polyp.   EGD performed on the same day revealed Candida esophagitis and gastritis with hiatal hernia. Stomach biopsies showed Helicobacter pylori gastritis. Prevpac was prescribed  Past Medical History  Diagnosis Date  . Hypertension   . Bipolar 1 disorder     sees a psychiatrist  . Anxiety   . Hyperlipidemia     no meds in > a decade per pt  . COPD (chronic obstructive pulmonary disease)   . Fibromyalgia   . Alcoholism in remission     states last alcohol 11 yrs ago  . Vitamin D deficiency 05/2011  . Osteopenia 05/2010    repeat bone densitometry 3-5 yrs  .  Memory loss     Abnl MRI brain and CT brain c/w chronic microvascular ischemia.  Repeat MRI brain 02/2014 stable (Dr. Mayra Reel with no plan to f/u with neuro as of 04/2014)  . History of Helicobacter pylori infection 04/2008    +gastric biopsy (gastritis but no metaplasia, dysplasia, or malignancy identified)  . Environmental allergies   . Shortness of breath   . GERD (gastroesophageal reflux disease)   . Left ureteral calculus 08/2013    Perc nephr--cystoscopy w/ureteroscopy + laser for stone removal.  Residual asymptomatic left renal nephrolithiasis <86mm present post-procedure.  Marland Kitchen History of adenomatous polyp of colon 04/2008    No high grade dysplasia: recall 5 yrs ([name], [name])  . History of basal cell carcinoma excision     NOSE  . 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 TMJ (temporomandibular joint disorder)     USES  MOUTH GUARD  . Chronic renal insufficiency, stage II (mild)   . Cervical spondylosis without myelopathy 12/19/2013    MRI 02/2014: multilevel DDD/spondylosis, not much change compared to prior MRI.  Pt not in favor of invasive therapy for her neck as of 04/2014.  Marland Kitchen Sprain of neck 12/19/2013  . Internal hemorrhoids   . Hiatal hernia     Past Surgical History  Procedure Laterality Date  . Cervical spine surgery  08-27-1999      C6 -- T1  LAMINECTOMY/  DISKECTOMY  . Total knee arthroplasty Right 2003  .  Cesarean section  1994  . Colonoscopy w/ polypectomy  04/2008    recall 5 yrs  . Esophagogastroduodenoscopy  2010    Candidal esophagitis by gross appearance on EGD, Esoph brushings neg for Candida or malignant cells.  . Nephrolithotomy Left 08/05/2013    Procedure: NEPHROLITHOTOMY PERCUTANEOUS;  Surgeon: [name], MD;  Location: [facility];  Service: Urology;  Laterality: Left;  . Cervical fusion  2002    anterior C5 -- C7  . Anterior cervical decomp/discectomy fusion   06-30-2009    C3 -- C5  AND EXPLORATION OF FUSION C5-7 W/  PLATE REMOVAL  . Tonsillectomy and adenoidectomy  1975  . Negative sleep study  04-01-2007  . Cystoscopy with ureteroscopy and stent placement Left 08/28/2013    Procedure: CYSTOSCOPY, RGP,  WITH URETEROSCOPY AND STENT REMOVAL;  Surgeon: [name], MD;  Location: [facility];  Service: Urology;  Laterality: Left;  . Holmium laser application Left 9/70/2637    Procedure: HOLMIUM LASER APPLICATION;  Surgeon: [name], MD;  Location: [facility];  Service: Urology;  Laterality: Left;    Outpatient Prescriptions Prior to Visit  Medication Sig Dispense Refill  . ALPRAZolam (XANAX) 1 MG tablet Take 5 mg by mouth 2 (two) times daily.     . carvedilol (COREG) 12.5 MG tablet Take 12.5 mg by mouth 2 (two) times daily with a meal.    . cetirizine (ZYRTEC) 10 MG tablet Take 10 mg by mouth every morning.     Marland Kitchen doxycycline (VIBRAMYCIN) 50 MG capsule Take 2 capsules by mouth daily.    Marland Kitchen lamoTRIgine (LAMICTAL) 100 MG tablet Take 300 mg by mouth at bedtime.     Marland Kitchen lisinopril (PRINIVIL,ZESTRIL) 30 MG tablet Take 1 tablet (30 mg total) by mouth daily. 30 tablet 6  . montelukast (SINGULAIR) 10 MG tablet TAKE 1 TABLET BY MOUTH EVERY EVENING 30 tablet 11  . nystatin (MYCOSTATIN) 100000 UNIT/ML suspension 1 tsp po swish and swallow qid x 14d 60 mL 1  . oxyCODONE (ROXICODONE) 15 MG immediate release tablet Take 15 mg by mouth 3 (three) times daily.    . polyethylene glycol powder (GLYCOLAX/MIRALAX) powder 1 capful once daily for constipation 3350 g 3  . PROAIR HFA 108 (90 BASE) MCG/ACT inhaler INHALE 2 PUFFS INTO THE LUNGS EVERY 4 (FOUR) HOURS AS NEEDED FOR WHEEZING OR SHORTNESS OF BREATH. 8.5 each 1  . ranitidine (ZANTAC) 150 MG capsule Take 150 mg by mouth 2 (two) times daily as needed.     . tiotropium (SPIRIVA) 18 MCG inhalation capsule Place 18 mcg into inhaler and inhale every morning.    . venlafaxine XR  (EFFEXOR-XR) 150 MG 24 hr capsule Take 150 mg by mouth daily with breakfast.    . zolpidem (AMBIEN) 10 MG tablet Take 10 mg by mouth at bedtime.    . gabapentin (NEURONTIN) 100 MG capsule TAKE ONE CAPSULE BY MOUTH 3 TIMES A DAY FOR 2 WEEKS THEN TAKE 2 CAPSULES 3 TIMES A DAY 180 capsule 3   No facility-administered medications prior to visit.    Allergies  Allergen Reactions  . Ceftin [Cefuroxime Axetil] Anaphylaxis    Swelling of eyes and tongue  . Morphine And Related Other (See Comments)    Hallucinations/ disoriented  . Prednisone Other (See Comments)    Hyperactivity    Family History  Problem Relation Age of Onset  . Alcohol abuse Mother   . Arthritis Mother   . Hypertension Mother   .  Hyperlipidemia Mother   . Diabetes Mother   . Parkinsonism Mother   . Esophageal cancer Sister   . Prostate cancer Father   . Hypertension Father   . Hyperlipidemia Father   . Diabetes Father   . Colon polyps Sister   . Colon polyps Brother   . Breast cancer Mother   . Heart disease Father   . Heart disease Brother     x 2  . Irritable bowel syndrome Sister     History  Substance Use Topics  . Smoking status: Former Smoker -- 1.00 packs/day for 33 years    Types: Cigarettes    Quit date: 08/22/1993  . Smokeless tobacco: Never Used  . Alcohol Use: No     Comment: ALCOHOLIC IN REMISSION SINCE  2004    ROS: As per history of present illness, otherwise negative  BP 120/68 mmHg  Pulse 68  Ht 5' 6.5" (1.689 m)  Wt 163 lb 12.8 oz (74.299 kg)  BMI 26.04 kg/m2 Constitutional: Well-developed and well-nourished. No distress. HEENT: Normocephalic and atraumatic. Oropharynx is clear and moist. No oropharyngeal exudate. Conjunctivae are normal.  No scleral icterus. Neck: Neck supple. Trachea midline. Cardiovascular: Normal rate, regular rhythm and intact distal pulses. No M/R/G Pulmonary/chest: Effort normal and breath sounds normal. No wheezing, rales or rhonchi. Abdominal: Soft,  nontender, nondistended. Bowel sounds active throughout. There are no masses palpable. No hepatosplenomegaly. Extremities: no clubbing, cyanosis, or edema Lymphadenopathy: No cervical adenopathy noted. Neurological: Alert and oriented to person place and time. Skin: Skin is warm and dry. No rashes noted. Psychiatric: Normal mood and affect. Behavior is normal.  RELEVANT LABS AND IMAGING: CBC    Component Value Date/Time   WBC 6.2 04/29/2014 1128   RBC 4.47 04/29/2014 1128   HGB 13.3 04/29/2014 1128   HCT 39.5 04/29/2014 1128   PLT 265.0 04/29/2014 1128   MCV 88.5 04/29/2014 1128   MCH 29.5 08/27/2013 1008   MCHC 33.5 04/29/2014 1128   RDW 13.1 04/29/2014 1128   LYMPHSABS 1.6 04/29/2014 1128   MONOABS 0.4 04/29/2014 1128   EOSABS 0.2 04/29/2014 1128   BASOSABS 0.0 04/29/2014 1128    CMP     Component Value Date/Time   NA 139 04/29/2014 1128   K 4.2 04/29/2014 1128   CL 103 04/29/2014 1128   CO2 29 04/29/2014 1128   GLUCOSE 88 04/29/2014 1128   BUN 12 04/29/2014 1128   CREATININE 0.93 04/29/2014 1128   CALCIUM 9.7 04/29/2014 1128   PROT 7.0 04/29/2014 1128   ALBUMIN 4.7 04/29/2014 1128   AST 17 04/29/2014 1128   ALT 13 04/29/2014 1128   ALKPHOS 53 04/29/2014 1128   BILITOT 0.6 04/29/2014 1128   GFRNONAA 59* 08/27/2013 1008   GFRAA 69* 08/27/2013 1008    ASSESSMENT/PLAN: 64 year old female with a past medical history of alcoholism in remission, H. pylori status post treatment, adenomatous colon polyps, bipolar disorder, hypertension, fibromyalgia, chronic renal insufficiency who is seen in consultation at the request of [name] to evaluate reflux and history of colon polyps.  1. Dysphagia with odynophagia -- suspicion of recurrent Candida esophagitis. Diflucan 200 mg 1 day followed by 100 mg 13 days. She is reminded to swish and spit after using her inhalers. Upper endoscopy is recommended. We discussed the test including the risks and benefits and she is  agreeable to proceed  2. History of H. pylori gastritis  -- follow-up at endoscopy, plan biopsy to confirm eradication   3. History of adenomatous  colon polyps -- due for surveillance, last exam 5 years ago. We discussed the test including the risks and benefits and she is agreeable to proceed  4. Constipation, opioid-induced -- responded well to MiraLAX continue 17 g daily  5. Anemia. Normocytic. -- Anemia has resolved as of last labs with no evidence of iron deficiency    LK:TGYBWL [place], Md [address] [place] [address]

## 2014-07-08 ENCOUNTER — Encounter: Payer: Self-pay | Admitting: Internal Medicine

## 2014-07-21 ENCOUNTER — Other Ambulatory Visit: Payer: Self-pay | Admitting: Family Medicine

## 2014-07-21 MED ORDER — TIOTROPIUM BROMIDE MONOHYDRATE 18 MCG IN CAPS: 18.0000 ug | ORAL_CAPSULE | Freq: Every morning | RESPIRATORY_TRACT | Status: DC

## 2014-07-25 ENCOUNTER — Encounter: Payer: Self-pay | Admitting: Internal Medicine

## 2014-07-25 ENCOUNTER — Telehealth: Payer: Self-pay | Admitting: Internal Medicine

## 2014-07-25 ENCOUNTER — Telehealth: Payer: Self-pay | Admitting: Family Medicine

## 2014-07-25 ENCOUNTER — Other Ambulatory Visit: Payer: Self-pay | Admitting: Family Medicine

## 2014-07-25 DIAGNOSIS — R1314 Dysphagia, pharyngoesophageal phase: Secondary | ICD-10-CM

## 2014-07-25 MED ORDER — CARVEDILOL 12.5 MG PO TABS: 12.5000 mg | ORAL_TABLET | Freq: Two times a day (BID) | ORAL | Status: DC

## 2014-07-25 NOTE — Telephone Encounter (Signed)
ecl is pending  Barium swallow with tablet can be ordered since EGD is not until May 10th Continue BID ranitidine

## 2014-07-25 NOTE — Telephone Encounter (Signed)
Patient calling to let Dr Hilarie Fredrickson know that the Diflucan did not help with her swallowing. She also states she is having some upper abdominal pain now.

## 2014-07-25 NOTE — Telephone Encounter (Signed)
LMOM for pt to P/U rx at pharmacy.

## 2014-07-25 NOTE — Telephone Encounter (Signed)
Pt lmom stating that she needs Rf of coreg.  You have not Rx'd this medication for pt.  Please advise RF.

## 2014-07-25 NOTE — Telephone Encounter (Signed)
Coreg eRx'd per pt request.

## 2014-07-25 NOTE — Telephone Encounter (Signed)
Scheduled Barium swallow on 06/02/14 at 2:00 PM. NPO 4 hours prior. Left a message for patient to call back.

## 2014-07-28 ENCOUNTER — Other Ambulatory Visit: Payer: Self-pay | Admitting: Family Medicine

## 2014-07-28 MED ORDER — LISINOPRIL 30 MG PO TABS: 30.0000 mg | ORAL_TABLET | Freq: Every day | ORAL | Status: DC

## 2014-07-28 NOTE — Telephone Encounter (Signed)
Appt cancelled per pt request, states she cannot afford this.

## 2014-07-31 ENCOUNTER — Other Ambulatory Visit: Payer: Self-pay | Admitting: Family Medicine

## 2014-07-31 MED ORDER — LISINOPRIL 30 MG PO TABS: 30.0000 mg | ORAL_TABLET | Freq: Every day | ORAL | Status: DC

## 2014-08-01 ENCOUNTER — Ambulatory Visit (HOSPITAL_COMMUNITY): Payer: Commercial Managed Care - HMO

## 2014-08-04 ENCOUNTER — Encounter: Payer: Commercial Managed Care - HMO | Admitting: Internal Medicine

## 2014-08-07 ENCOUNTER — Ambulatory Visit (INDEPENDENT_AMBULATORY_CARE_PROVIDER_SITE_OTHER): Payer: Commercial Managed Care - HMO | Admitting: Family Medicine

## 2014-08-07 ENCOUNTER — Encounter: Payer: Self-pay | Admitting: Family Medicine

## 2014-08-07 VITALS — BP 124/68 | HR 80 | Temp 100.0°F | Resp 16 | Wt 163.0 lb

## 2014-08-07 DIAGNOSIS — J3089 Other allergic rhinitis: Secondary | ICD-10-CM | POA: Diagnosis not present

## 2014-08-07 DIAGNOSIS — I10 Essential (primary) hypertension: Secondary | ICD-10-CM | POA: Diagnosis not present

## 2014-08-07 DIAGNOSIS — Z23 Encounter for immunization: Secondary | ICD-10-CM

## 2014-08-07 DIAGNOSIS — H9201 Otalgia, right ear: Secondary | ICD-10-CM

## 2014-08-07 DIAGNOSIS — Z0000 Encounter for general adult medical examination without abnormal findings: Secondary | ICD-10-CM

## 2014-08-07 DIAGNOSIS — H6981 Other specified disorders of Eustachian tube, right ear: Secondary | ICD-10-CM | POA: Diagnosis not present

## 2014-08-07 DIAGNOSIS — F3170 Bipolar disorder, currently in remission, most recent episode unspecified: Secondary | ICD-10-CM

## 2014-08-07 MED ORDER — FLUTICASONE PROPIONATE 50 MCG/ACT NA SUSP: 2.0000 | Freq: Every day | NASAL | Status: DC

## 2014-08-07 NOTE — Progress Notes (Signed)
Pre visit review using our clinic review tool, if applicable. No additional management support is needed unless otherwise documented below in the visit note. 

## 2014-08-07 NOTE — Progress Notes (Signed)
OFFICE VISIT  08/07/2014   CC:  Chief Complaint  Patient presents with  . Hypertension  . Ear Pain    x 4 days right ear   HPI:    Patient is a 64 y.o. Caucasian female who presents for f/u HTN She saw Dr. Hilarie Fredrickson 06/2014 and he treated her for presumed recurrence of candidal esophagitis and made arrangements for her to have EGD and a repeat colonoscopy (pt due for repeat due to hx of aden col pol).  She did not improve s/p antifungal treatment so she is now also set up for a barium swallow test the same day as her EGD and colonoscopy--coming up next week.  Says morning bps are fine but says bp tends to go up to 150-160 over 90 in evenings due to a spike in her pain in neck and shoulders.  It goes back down after she takes a muscle relaxer  Right ear hurting x 4d, some impaired hearing recently in that ear, no ear drainage. Says she has had chronic allergic rhinitis sx's.  No fevers.  Pt says her insurer will no longer cover her visits to psychiatrist at [facility].    She is currently on lamictal 300 mg qd and effexor XR 150 qd and reports that overall mood has been stable on these meds.  Past Medical History  Diagnosis Date  . Hypertension   . Bipolar 1 disorder     sees a psychiatrist  . Anxiety   . Hyperlipidemia     no meds in > a decade per pt  . COPD (chronic obstructive pulmonary disease)   . Fibromyalgia   . Alcoholism in remission     states last alcohol 11 yrs ago  . Vitamin D deficiency 05/2011  . Osteopenia 05/2010    repeat bone densitometry 3-5 yrs  . Memory loss     Abnl MRI brain and CT brain c/w chronic microvascular ischemia.  Repeat MRI brain 02/2014 stable (Dr. Mayra Reel with no plan to f/u with neuro as of 04/2014)  . History of Helicobacter pylori infection 04/2008    +gastric biopsy (gastritis but no metaplasia, dysplasia, or malignancy identified)  . Environmental allergies   . Shortness of breath   . GERD (gastroesophageal reflux disease)   . Left  ureteral calculus 08/2013    Perc nephr--cystoscopy w/ureteroscopy + laser for stone removal.  Residual asymptomatic left renal nephrolithiasis <45mm present post-procedure.  Marland Kitchen History of adenomatous polyp of colon 04/2008    No high grade dysplasia: recall 5 yrs ([name], [name])  . History of basal cell carcinoma excision     NOSE  . 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 TMJ (temporomandibular joint disorder)     USES  MOUTH GUARD  . Chronic renal insufficiency, stage II (mild)   . Cervical spondylosis without myelopathy 12/19/2013    MRI 02/2014: multilevel DDD/spondylosis, not much change compared to prior MRI.  Pt not in favor of invasive therapy for her neck as of 04/2014.  Marland Kitchen Sprain of neck 12/19/2013  . Internal hemorrhoids   . Hiatal hernia     Past Surgical History  Procedure Laterality Date  . Cervical spine surgery  08-27-1999      C6 -- T1  LAMINECTOMY/  DISKECTOMY  . Total knee arthroplasty Right 2003  . Cesarean section  1994  . Colonoscopy w/ polypectomy  04/2008  recall 5 yrs  . Esophagogastroduodenoscopy  2010    Candidal esophagitis by gross appearance on EGD, Esoph brushings neg for Candida or malignant cells.  . Nephrolithotomy Left 08/05/2013    Procedure: NEPHROLITHOTOMY PERCUTANEOUS;  Surgeon: [name], MD;  Location: [facility];  Service: Urology;  Laterality: Left;  . Cervical fusion  2002    anterior C5 -- C7  . Anterior cervical decomp/discectomy fusion  06-30-2009    C3 -- C5  AND EXPLORATION OF FUSION C5-7 W/  PLATE REMOVAL  . Tonsillectomy and adenoidectomy  1975  . Negative sleep study  04-01-2007  . Cystoscopy with ureteroscopy and stent placement Left 08/28/2013    Procedure: CYSTOSCOPY, RGP,  WITH URETEROSCOPY AND STENT REMOVAL;  Surgeon: [name], MD;  Location: [facility];  Service: Urology;  Laterality: Left;  . Holmium laser application Left  6/60/6301    Procedure: HOLMIUM LASER APPLICATION;  Surgeon: [name], MD;  Location: [facility];  Service: Urology;  Laterality: Left;   Takes 50 mg doxy a day prescribed by her dermatologist for chronic skin condition that she cannot recall the name of. Outpatient Prescriptions Prior to Visit  Medication Sig Dispense Refill  . ALPRAZolam (XANAX) 1 MG tablet Take 5 mg by mouth 2 (two) times daily.     . carvedilol (COREG) 12.5 MG tablet Take 1 tablet (12.5 mg total) by mouth 2 (two) times daily with a meal. 60 tablet 11  . cetirizine (ZYRTEC) 10 MG tablet Take 10 mg by mouth every morning.     Marland Kitchen doxycycline (VIBRAMYCIN) 50 MG capsule Take 2 capsules by mouth daily.    . fluconazole (DIFLUCAN) 100 MG tablet Take 2 tablets (200 mg) on day 1, then take 1 tablet by mouth daily thereafter x 13 days 15 tablet 0  . lamoTRIgine (LAMICTAL) 100 MG tablet Take 300 mg by mouth at bedtime.     Marland Kitchen lisinopril (PRINIVIL,ZESTRIL) 30 MG tablet Take 1 tablet (30 mg total) by mouth daily. 30 tablet 3  . montelukast (SINGULAIR) 10 MG tablet TAKE 1 TABLET BY MOUTH EVERY EVENING 30 tablet 11  . nystatin (MYCOSTATIN) 100000 UNIT/ML suspension 1 tsp po swish and swallow qid x 14d 60 mL 1  . oxyCODONE (ROXICODONE) 15 MG immediate release tablet Take 15 mg by mouth 3 (three) times daily.    . polyethylene glycol powder (GLYCOLAX/MIRALAX) powder 1 capful once daily for constipation 3350 g 3  . PROAIR HFA 108 (90 BASE) MCG/ACT inhaler INHALE 2 PUFFS INTO THE LUNGS EVERY 4 (FOUR) HOURS AS NEEDED FOR WHEEZING OR SHORTNESS OF BREATH. 8.5 each 1  . ranitidine (ZANTAC) 150 MG capsule Take 150 mg by mouth 2 (two) times daily as needed.     . tiotropium (SPIRIVA) 18 MCG inhalation capsule Place 1 capsule (18 mcg total) into inhaler and inhale every morning. 30 capsule 3  . venlafaxine XR (EFFEXOR-XR) 150 MG 24 hr capsule Take 150 mg by mouth daily with breakfast.    . zolpidem (AMBIEN) 10 MG tablet Take 10 mg  by mouth at bedtime.     No facility-administered medications prior to visit.    Allergies  Allergen Reactions  . Ceftin [Cefuroxime Axetil] Anaphylaxis    Swelling of eyes and tongue  . Morphine And Related Other (See Comments)    Hallucinations/ disoriented  . Prednisone Other (See Comments)    Hyperactivity    ROS As per HPI  PE: Blood pressure 124/68, pulse 80,  temperature 100 F (37.8 C), temperature source Temporal, resp. rate 16, weight 163 lb (73.936 kg), SpO2 96 %. VS: noted--normal. Gen: alert, NAD, NONTOXIC APPEARING. HEENT: eyes without injection, drainage, or swelling.  Ears: EACs clear, TMs with normal light reflex and landmarks, R TM mildly distended, L TM mildly retracted.  Nose: Scant clear rhinorrhea, with some dried, crusty exudate adherent to mildly injected mucosa.  No purulent d/c.  No paranasal sinus TTP.  No facial swelling.     LABS:    Chemistry      Component Value Date/Time   NA 139 04/29/2014 1128   K 4.2 04/29/2014 1128   CL 103 04/29/2014 1128   CO2 29 04/29/2014 1128   BUN 12 04/29/2014 1128   CREATININE 0.93 04/29/2014 1128      Component Value Date/Time   CALCIUM 9.7 04/29/2014 1128   ALKPHOS 53 04/29/2014 1128   AST 17 04/29/2014 1128   ALT 13 04/29/2014 1128   BILITOT 0.6 04/29/2014 1128       IMPRESSION AND PLAN:  1) HTN; doing fine.  Some fluctuations with pain level but nothing persistent. Continue current treatments. Lytes/cr fine 04/2014.  2) Right otalgia, secondary to R eustachian tube dysfunction from her chronic allergic rhinitis. Needs to be on nasal steroid so I rx'd flonase today.  3) Bipolar disorder: insurer won't cover current psych provider anymore and she asks if I'll assume rx'ing responsibilities for her meds/management of this condition.  I will do this.  No rx's were needed today.  4) Prev health care: pneumovax 23 IM and Tdap IM today.  She needs pap/pelvic but wants to put this off for a while longer  since she has a lot of more pressing medical issues to deal with right now (GI problems).  An After Visit Summary was printed and given to the patient.  FOLLOW UP: Return in about 6 months (around 02/07/2015) for annual CPE (fasting).

## 2014-08-07 NOTE — Addendum Note (Signed)
Addended by: Lanae Crumbly on: 08/07/2014 11:58 AM   Modules accepted: Orders

## 2014-08-12 ENCOUNTER — Other Ambulatory Visit: Payer: Self-pay | Admitting: Internal Medicine

## 2014-08-12 ENCOUNTER — Encounter: Payer: Self-pay | Admitting: Internal Medicine

## 2014-08-12 ENCOUNTER — Ambulatory Visit (AMBULATORY_SURGERY_CENTER): Payer: Commercial Managed Care - HMO | Admitting: Internal Medicine

## 2014-08-12 VITALS — BP 153/85 | HR 72 | Temp 98.2°F | Resp 30 | Ht 66.0 in | Wt 163.0 lb

## 2014-08-12 DIAGNOSIS — B3781 Candidal esophagitis: Secondary | ICD-10-CM | POA: Diagnosis not present

## 2014-08-12 DIAGNOSIS — Z86010 Personal history of colonic polyps: Secondary | ICD-10-CM

## 2014-08-12 DIAGNOSIS — D124 Benign neoplasm of descending colon: Secondary | ICD-10-CM | POA: Diagnosis not present

## 2014-08-12 DIAGNOSIS — R1314 Dysphagia, pharyngoesophageal phase: Secondary | ICD-10-CM | POA: Diagnosis not present

## 2014-08-12 DIAGNOSIS — R1310 Dysphagia, unspecified: Secondary | ICD-10-CM

## 2014-08-12 DIAGNOSIS — Z8619 Personal history of other infectious and parasitic diseases: Secondary | ICD-10-CM

## 2014-08-12 MED ORDER — FLUCONAZOLE 100 MG PO TABS: 100.0000 mg | ORAL_TABLET | Freq: Every day | ORAL | Status: DC

## 2014-08-12 MED ORDER — SODIUM CHLORIDE 0.9 % IV SOLN: 500.0000 mL | INTRAVENOUS | Status: DC

## 2014-08-12 NOTE — Op Note (Signed)
[place]  [address] [place], [id number]   ENDOSCOPY PROCEDURE REPORT  PATIENT: Erin, Lopez  MR#: [id number] BIRTHDATE: 1951/01/26 , 63  yrs. old GENDER: female ENDOSCOPIST: Jerene Bears, MD PROCEDURE DATE:  08/12/2014 PROCEDURE:  EGD w/ biopsy ASA CLASS:     Class III INDICATIONS:  dysphagia, odynophagia, and history of H.  pylori gastritis. MEDICATIONS: Propofol 200 mg IV and Monitored anesthesia care TOPICAL ANESTHETIC: none  DESCRIPTION OF PROCEDURE: After the risks benefits and alternatives of the procedure were thoroughly explained, informed consent was obtained.  The LB [id number] endoscope was introduced through the mouth and advanced to the second portion of the duodenum , Without limitations.  The instrument was slowly withdrawn as the mucosa was fully examined.   ESOPHAGUS: White exudates consistent with candidiasis were found in the proximal esophagus and mid esophagus.   Multiple biopsies obtained in the distal, mid and proximal esophagus  STOMACH: Gastritis (inflammation) was found in the gastric body and gastric antrum.  Cold forcep biopsies were taken at the gastric body, antrum and angularis to evaluate for h.  pylori.  DUODENUM: The duodenal mucosa showed no abnormalities in the bulb and 2nd part of the duodenum. Retroflexed views revealed no abnormalities.     The scope was then withdrawn from the patient and the procedure completed.  COMPLICATIONS: There were no immediate complications.  ENDOSCOPIC IMPRESSION: 1.   White exudates consistent with candidiasis in the proximal esophagus and mid esophagus; multiple biopsies 2.   Gastritis (inflammation) was found in the gastric body and gastric antrum; multiple biopsies 3.   The duodenal mucosa showed no abnormalities in the bulb and 2nd part of the duodenum  RECOMMENDATIONS: 1.  Await biopsy results 2.  Rinse mouth after using steroid inhaler and nasal sprays 3.   Retreatment with fluconazole 200 mg -1 day and 100 mg -13 days 4.  Follow-up of helicobacter pylori status, treat if indicated  eSigned:  Jerene Bears, MD 2014-08-12 15:00:12.570   EO:[id number] McGowen, MD and The Patient

## 2014-08-12 NOTE — Patient Instructions (Signed)

## 2014-08-12 NOTE — Progress Notes (Signed)
Report to PACU, RN, vss, BBS= Clear.  

## 2014-08-12 NOTE — Op Note (Signed)
[place]  [address]. [place], [address]   COLONOSCOPY PROCEDURE REPORT  PATIENT: Erin Lopez, Erin Lopez  MR#: [id number] BIRTHDATE: 01/23/1951 , 63  yrs. old GENDER: female ENDOSCOPIST: Jerene Bears, MD PROCEDURE DATE:  08/12/2014 PROCEDURE:   Colonoscopy, surveillance and Colonoscopy with cold biopsy polypectomy First Screening Colonoscopy - Avg.  risk and is 50 yrs.  old or older - No.  Prior Negative Screening - Now for repeat screening. N/A  History of Adenoma - Now for follow-up colonoscopy & has been > or = to 3 yrs.  Yes hx of adenoma.  Has been 3 or more years since last colonoscopy.  Polyps Removed Today ASA CLASS:   Class III INDICATIONS:Surveillance due to prior colonic neoplasia and PH Colon Adenoma. MEDICATIONS: Monitored anesthesia care, Propofol 400 mg IV, and this was the total dose used for all procedures at this session  DESCRIPTION OF PROCEDURE:   After the risks benefits and alternatives of the procedure were thoroughly explained, informed consent was obtained.  The digital rectal exam revealed no rectal mass.   The LB [id number]  endoscope was introduced through the anus and advanced to the cecum, which was identified by both the appendix and ileocecal valve. No adverse events experienced. The quality of the prep was good.  (MiraLax was used)  The instrument was then slowly withdrawn as the colon was fully examined.   COLON FINDINGS: A sessile polyp measuring 4 mm in size was found in the descending colon.  A polypectomy was performed with cold forceps.  The resection was complete, the polyp tissue was completely retrieved and sent to histology.   There was moderate diverticulosis noted in the descending colon and sigmoid colon with associated muscular hypertrophy.  Retroflexed views revealed internal hemorrhoids and Retroflexed views revealed external hemorrhoids. The time to cecum = 5.4 Withdrawal time = 10.5   The scope was  withdrawn and the procedure completed. COMPLICATIONS: There were no immediate complications.  ENDOSCOPIC IMPRESSION: 1.   Sessile polyp was found in the descending colon; polypectomy was performed with cold forceps 2.   There was moderate diverticulosis noted in the descending colon and sigmoid colon  RECOMMENDATIONS: 1.  Await pathology results 2.  High fiber diet 3.  Repeat Colonoscopy in 5 years. 4.  You will receive a letter within 1-2 weeks with the results of your biopsy as well as final recommendations.  Please call my office if you have not received a letter after 3 weeks.  eSigned:  Jerene Bears, MD 2014-08-12 [phone]   cc: Ricardo Jericho, MD and The Patient

## 2014-08-12 NOTE — Progress Notes (Signed)
Called to room to assist during endoscopic procedure.  Patient ID and intended procedure confirmed with present staff. Received instructions for my participation in the procedure from the performing physician.  

## 2014-08-13 ENCOUNTER — Telehealth: Payer: Self-pay

## 2014-08-13 NOTE — Telephone Encounter (Signed)
Left message on answering machine. 

## 2014-08-20 ENCOUNTER — Encounter: Payer: Self-pay | Admitting: Internal Medicine

## 2014-08-26 ENCOUNTER — Other Ambulatory Visit: Payer: Self-pay | Admitting: *Deleted

## 2014-08-26 MED ORDER — LAMOTRIGINE 100 MG PO TABS: 300.0000 mg | ORAL_TABLET | Freq: Every day | ORAL | Status: DC

## 2014-08-26 MED ORDER — VENLAFAXINE HCL ER 150 MG PO CP24: 150.0000 mg | ORAL_CAPSULE | Freq: Every day | ORAL | Status: DC

## 2014-08-26 NOTE — Telephone Encounter (Signed)
Fax from [facility] [address]) requesting refill for Venlafaxine 150mg  tae 1 cap po qam. LOV 08/07/14, up coming ov 02/10/15, last written: unknown. Please review. Thanks.

## 2014-08-26 NOTE — Telephone Encounter (Signed)
Also requesting Lamotrigine 100mg  take 3 tab po daily.

## 2014-08-28 ENCOUNTER — Other Ambulatory Visit: Payer: Self-pay | Admitting: *Deleted

## 2014-08-28 MED ORDER — MONTELUKAST SODIUM 10 MG PO TABS: 10.0000 mg | ORAL_TABLET | Freq: Every evening | ORAL | Status: DC

## 2014-08-28 NOTE — Telephone Encounter (Signed)
[facility]ax from [facility] [address]) requesting refill for montelukast 10mg  take 1 tablet daily. LOV 08/07/14, up coming ov 02/10/15, last written: 09/12/13 w/ 11Rf. Rx sent for #30 w/ 6RF.

## 2014-09-02 ENCOUNTER — Ambulatory Visit: Payer: Commercial Managed Care - HMO | Admitting: Family Medicine

## 2014-11-04 ENCOUNTER — Other Ambulatory Visit: Payer: Self-pay | Admitting: *Deleted

## 2014-11-04 MED ORDER — POLYETHYLENE GLYCOL 3350 17 GM/SCOOP PO POWD: 17.0000 g | Freq: Every day | ORAL | Status: DC

## 2014-11-04 NOTE — Telephone Encounter (Signed)
Refill request from [facility] requesting polyethylene glycol 3350 powder. Medication currently not on pts med list. Per [name] okay send in Polyethylene glycol 3350 powder take 17 grams daily for constipation Qty: 2563 with 12RF. Rx sent to pharmacy as directed.

## 2014-11-05 ENCOUNTER — Ambulatory Visit (HOSPITAL_BASED_OUTPATIENT_CLINIC_OR_DEPARTMENT_OTHER)
Admission: RE | Admit: 2014-11-05 | Discharge: 2014-11-05 | Disposition: A | Discharge status: Home / Self Care | Payer: Commercial Managed Care - HMO | Source: Ambulatory Visit | Attending: Family Medicine | Admitting: Family Medicine

## 2014-11-05 ENCOUNTER — Ambulatory Visit (INDEPENDENT_AMBULATORY_CARE_PROVIDER_SITE_OTHER): Payer: Commercial Managed Care - HMO | Admitting: Family Medicine

## 2014-11-05 ENCOUNTER — Encounter: Payer: Self-pay | Admitting: Family Medicine

## 2014-11-05 VITALS — BP 111/69 | HR 80 | Temp 97.4°F | Ht 66.5 in | Wt 167.0 lb

## 2014-11-05 DIAGNOSIS — M79671 Pain in right foot: Secondary | ICD-10-CM | POA: Diagnosis present

## 2014-11-05 DIAGNOSIS — S99911A Unspecified injury of right ankle, initial encounter: Secondary | ICD-10-CM | POA: Diagnosis not present

## 2014-11-05 DIAGNOSIS — M25471 Effusion, right ankle: Secondary | ICD-10-CM | POA: Insufficient documentation

## 2014-11-05 DIAGNOSIS — Y9389 Activity, other specified: Secondary | ICD-10-CM | POA: Insufficient documentation

## 2014-11-05 DIAGNOSIS — S8261XA Displaced fracture of lateral malleolus of right fibula, initial encounter for closed fracture: Secondary | ICD-10-CM | POA: Diagnosis not present

## 2014-11-05 DIAGNOSIS — M25571 Pain in right ankle and joints of right foot: Secondary | ICD-10-CM | POA: Insufficient documentation

## 2014-11-05 NOTE — Assessment & Plan Note (Signed)
Erin Lopez is a 64 y.o. Caucasian female with right ankle/foot injury. - Discuss sprain versus fracture management to patient.  She did meet [place] ankle and foot criteria. Patient sent to [facility] [place] to have right foot and ankle x-rays. - Discuss results would be available to [place] until the morning. Patient was wrapped in Ace and ankle stabilizer. She was instructed on RICE therapy. Given AVS on ankle sprain. Patient will be called in the morning with results and discuss further management. Fractured refer to orthopedics, if sprain will keep as nonweightbearing, stabilized for 1 week and then follow-up.
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
## 2014-11-05 NOTE — Patient Instructions (Signed)

## 2014-11-06 ENCOUNTER — Telehealth: Payer: Self-pay | Admitting: Family Medicine

## 2014-11-06 DIAGNOSIS — S99911A Unspecified injury of right ankle, initial encounter: Secondary | ICD-10-CM

## 2014-11-06 MED ORDER — NAPROXEN 500 MG PO TABS: 500.0000 mg | ORAL_TABLET | Freq: Two times a day (BID) | ORAL | Status: DC

## 2014-11-06 MED ORDER — AIRCAST SPORT ANKLE BRACE/LEFT MISC: Status: DC

## 2014-11-06 NOTE — Assessment & Plan Note (Signed)
11/05/2014: xrays resulted with Avulsion injury of the lateral malleolus associated with significant soft tissue swelling. RICE therapy, aircast, crutches (non weight bearing), ortho referral, NSAIDS

## 2014-11-06 NOTE — Telephone Encounter (Signed)
Patient was called this morning regarding her results of her x-rays yesterday after her left ankle injury. 11/05/2014: xrays resulted with Avulsion injury of the lateral malleolus associated with significant soft tissue swelling. RICE therapy, aircast, crutches (non weight bearing), ortho referral, NSAIDS. Patient in understanding of treatment plan and results. [place] DO

## 2014-12-08 ENCOUNTER — Other Ambulatory Visit: Payer: Self-pay | Admitting: Neurology

## 2014-12-09 ENCOUNTER — Other Ambulatory Visit: Payer: Self-pay | Admitting: *Deleted

## 2014-12-09 MED ORDER — LISINOPRIL 30 MG PO TABS: 30.0000 mg | ORAL_TABLET | Freq: Every day | ORAL | Status: DC

## 2014-12-09 NOTE — Telephone Encounter (Signed)
RF request for lisinopril LOV: 08/07/14 Next ov:  02/10/15 Last written: 07/31/14 #30 w/ 3RF

## 2015-02-10 ENCOUNTER — Ambulatory Visit (INDEPENDENT_AMBULATORY_CARE_PROVIDER_SITE_OTHER): Payer: Commercial Managed Care - HMO | Admitting: Family Medicine

## 2015-02-10 ENCOUNTER — Encounter: Payer: Self-pay | Admitting: Family Medicine

## 2015-02-10 VITALS — BP 116/81 | HR 81 | Temp 98.6°F | Resp 16 | Ht 66.25 in | Wt 164.0 lb

## 2015-02-10 DIAGNOSIS — M5030 Other cervical disc degeneration, unspecified cervical region: Secondary | ICD-10-CM

## 2015-02-10 DIAGNOSIS — M5136 Other intervertebral disc degeneration, lumbar region: Secondary | ICD-10-CM | POA: Diagnosis not present

## 2015-02-10 DIAGNOSIS — M26603 Bilateral temporomandibular joint disorder, unspecified: Secondary | ICD-10-CM | POA: Diagnosis not present

## 2015-02-10 DIAGNOSIS — I10 Essential (primary) hypertension: Secondary | ICD-10-CM

## 2015-02-10 DIAGNOSIS — S0300XA Dislocation of jaw, unspecified side, initial encounter: Secondary | ICD-10-CM

## 2015-02-10 NOTE — Progress Notes (Signed)
OFFICE VISIT  02/10/2015   CC:  Chief Complaint  Patient presents with  . Follow-up    HTN. Pt is not fasting.   Marland Kitchen Headache    Temple on right side x 6 months  . Numbness    Right side of face and right foot x 6 months   HPI:    Patient is a 64 y.o. Caucasian female who presents for HA.  Approx 8 mo of come/go type pain, throbbing in R temple.  Occ with tingling sensation R lower face area--seems random.  R ear feels stopped up/feels swollen.  No ear drainage, no fevers.  Says has constant nasal allergies. Sometimes the temple pain is in both sides.   Seems more frequent in last couple months, almost daily. No dysarthria or dysphagia.  No vision abnormalities with these sx's.   She does wear a mouth guard during sleep for TMJ.  Has hx of both cervical and lumbar DDD but pt is not interested in interventions.  Says she saw Dr. Gladstone Lighter for LBP and he dx'd her with lumbar DDD--she also has numbness on top of R foot related to this.  Home bp monitoring occ: normal.  Compliant with meds.  Past Medical History  Diagnosis Date  . Hypertension   . Bipolar 1 disorder [facility]     sees a psychiatrist  . Anxiety   . Hyperlipidemia     no meds in > a decade per pt  . COPD (chronic obstructive pulmonary disease) ([place])   . Fibromyalgia   . Alcoholism in remission [facility])     states last alcohol 11 yrs ago  . Vitamin D deficiency 05/2011  . Osteopenia 05/2010    repeat bone densitometry 3-5 yrs  . Memory loss     Abnl MRI brain and CT brain c/w chronic microvascular ischemia.  Repeat MRI brain 02/2014 stable (Dr. Mayra Reel with no plan to f/u with neuro as of 04/2014)  . History of Helicobacter pylori infection 04/2008    +gastric biopsy (gastritis but no metaplasia, dysplasia, or malignancy identified)  . Environmental allergies   . Shortness of breath   . GERD (gastroesophageal reflux disease)   . Left ureteral calculus 08/2013    Perc nephr--cystoscopy w/ureteroscopy + laser for stone  removal.  Residual asymptomatic left renal nephrolithiasis <70mm present post-procedure.  Marland Kitchen History of adenomatous polyp of colon 04/2008    No high grade dysplasia: recall 5 yrs ([name], [name])  . History of basal cell carcinoma excision     NOSE  . 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 TMJ (temporomandibular joint disorder)     USES  MOUTH GUARD  . Chronic renal insufficiency, stage II (mild)   . Cervical spondylosis without myelopathy 12/19/2013    MRI 02/2014: multilevel DDD/spondylosis, not much change compared to prior MRI.  Pt not in favor of invasive therapy for her neck as of 04/2014.  Marland Kitchen Sprain of neck 12/19/2013  . Internal hemorrhoids   . Hiatal hernia     Past Surgical History  Procedure Laterality Date  . Cervical spine surgery  08-27-1999      C6 -- T1  LAMINECTOMY/  DISKECTOMY  . Total knee arthroplasty Right 2003  . Cesarean section  1994  . Colonoscopy w/ polypectomy  04/2008    recall 5 yrs  . Esophagogastroduodenoscopy  2010    Candidal esophagitis by gross appearance  on EGD, Esoph brushings neg for Candida or malignant cells.  . Nephrolithotomy Left 08/05/2013    Procedure: NEPHROLITHOTOMY PERCUTANEOUS;  Surgeon: [name], MD;  Location: [facility];  Service: Urology;  Laterality: Left;  . Cervical fusion  2002    anterior C5 -- C7  . Anterior cervical decomp/discectomy fusion  06-30-2009    C3 -- C5  AND EXPLORATION OF FUSION C5-7 W/  PLATE REMOVAL  . Tonsillectomy and adenoidectomy  1975  . Negative sleep study  04-01-2007  . Cystoscopy with ureteroscopy and stent placement Left 08/28/2013    Procedure: CYSTOSCOPY, RGP,  WITH URETEROSCOPY AND STENT REMOVAL;  Surgeon: [name], MD;  Location: [facility];  Service: Urology;  Laterality: Left;  . Holmium laser application Left 6/97/9480    Procedure: HOLMIUM LASER APPLICATION;  Surgeon: [name], MD;   Location: [facility];  Service: Urology;  Laterality: Left;    Outpatient Prescriptions Prior to Visit  Medication Sig Dispense Refill  . ALPRAZolam (XANAX) 1 MG tablet Take 5 mg by mouth 2 (two) times daily.     . carvedilol (COREG) 12.5 MG tablet Take 1 tablet (12.5 mg total) by mouth 2 (two) times daily with a meal. 60 tablet 11  . cetirizine (ZYRTEC) 10 MG tablet Take 10 mg by mouth every morning.     . fluticasone (FLONASE) 50 MCG/ACT nasal spray Place 2 sprays into both nostrils daily. 16 g 12  . lamoTRIgine (LAMICTAL) 100 MG tablet Take 3 tablets (300 mg total) by mouth at bedtime. 90 tablet 11  . lisinopril (PRINIVIL,ZESTRIL) 30 MG tablet Take 1 tablet (30 mg total) by mouth daily. 30 tablet 3  . montelukast (SINGULAIR) 10 MG tablet Take 1 tablet (10 mg total) by mouth every evening. 30 tablet 6  . oxyCODONE (ROXICODONE) 15 MG immediate release tablet Take 15 mg by mouth 3 (three) times daily.    . polyethylene glycol powder (GLYCOLAX/MIRALAX) powder Take 17 g by mouth daily. 3162 g 12  . PROAIR HFA 108 (90 BASE) MCG/ACT inhaler INHALE 2 PUFFS INTO THE LUNGS EVERY 4 (FOUR) HOURS AS NEEDED FOR WHEEZING OR SHORTNESS OF BREATH. 8.5 each 1  . ranitidine (ZANTAC) 150 MG capsule Take 150 mg by mouth 2 (two) times daily as needed.     . tiotropium (SPIRIVA) 18 MCG inhalation capsule Place 1 capsule (18 mcg total) into inhaler and inhale every morning. 30 capsule 3  . venlafaxine XR (EFFEXOR-XR) 150 MG 24 hr capsule Take 1 capsule (150 mg total) by mouth daily with breakfast. 30 capsule 11  . zolpidem (AMBIEN) 10 MG tablet Take 10 mg by mouth at bedtime.    Water engineer Bandages & Supports (AIRCAST SPORT ANKLE BRACE/LEFT) MISC Left Aircast (fit pt and apply) (Patient not taking: Reported on 02/10/2015) 1 each 0  . gabapentin (NEURONTIN) 100 MG capsule TAKE ONE CAPSULE BY MOUTH 3 TIMES A DAY FOR 2 WEEKS THEN TAKE 2 CAPSULES 3 TIMES A DAY (Patient not taking: Reported on 02/10/2015) 180  capsule 3  . naproxen (NAPROSYN) 500 MG tablet Take 1 tablet (500 mg total) by mouth 2 (two) times daily with a meal. (Patient not taking: Reported on 02/10/2015) 30 tablet 0   No facility-administered medications prior to visit.    Allergies  Allergen Reactions  . Ceftin [Cefuroxime Axetil] Anaphylaxis    Swelling of eyes and tongue  . Morphine And Related Other (See Comments)    Hallucinations/ disoriented  .  Prednisone Other (See Comments)    Hyperactivity    ROS As per HPI  PE: Blood pressure 116/81, pulse 81, temperature 98.6 F (37 C), temperature source Oral, resp. rate 16, height 5' 6.25" (1.683 m), weight 164 lb (74.39 kg), SpO2 92 %. Gen: Alert, well appearing.  Patient is oriented to person, place, time, and situation. ENT: Ears: EACs clear, normal epithelium.  TMs with good light reflex and landmarks bilaterally.  Eyes: no injection, icteris, swelling, or exudate.  EOMI, PERRLA. Nose: no drainage or turbinate edema/swelling.  No injection or focal lesion.  Mouth: lips without lesion/swelling.  Oral mucosa pink and moist.  Dentition intact and without obvious caries or gingival swelling.  Oropharynx without erythema, exudate, or swelling.  Neuro: CN 2-12 intact bilaterally, strength 5/5 in proximal and distal upper extremities and lower extremities bilaterally.  No sensory deficits.  No tremor.  No disdiadochokinesis.  No ataxia.  Upper extremity and lower extremity DTRs symmetric.  No pronator drift.  LABS:  None today  IMPRESSION AND PLAN:  1) TMJ disorder, R>L. Exercise handout reviewed with pt and given to her to take home.  No new meds. Reassurance given.  2) HTN; The current medical regimen is effective;  continue present plan and medications.  3) C spine and L spine DDD with radiculopathy intermittently.  She is not interested in interventions like injections or surgery---has ortho MD, Dr. Gladstone Lighter.  Will ask for records.  An After Visit Summary was printed and  given to the patient.   FOLLOW UP: Return in about 3 months (around 05/13/2015) for annual CPE (fasting).

## 2015-02-10 NOTE — Progress Notes (Signed)
Pre visit review using our clinic review tool, if applicable. No additional management support is needed unless otherwise documented below in the visit note. 

## 2015-02-25 ENCOUNTER — Encounter: Payer: Self-pay | Admitting: Family Medicine

## 2015-02-25 DIAGNOSIS — M26609 Unspecified temporomandibular joint disorder, unspecified side: Secondary | ICD-10-CM | POA: Insufficient documentation

## 2015-03-23 ENCOUNTER — Other Ambulatory Visit: Payer: Self-pay | Admitting: *Deleted

## 2015-03-23 MED ORDER — MONTELUKAST SODIUM 10 MG PO TABS: 10.0000 mg | ORAL_TABLET | Freq: Every evening | ORAL | Status: DC

## 2015-03-23 NOTE — Telephone Encounter (Signed)
RF request for montelukast LOV: 02/10/15 Next ov:  05/15/15 Last written: 08/28/14 #30 w/ 6RF

## 2015-03-24 ENCOUNTER — Other Ambulatory Visit: Payer: Self-pay | Admitting: *Deleted

## 2015-03-24 MED ORDER — LISINOPRIL 30 MG PO TABS: 30.0000 mg | ORAL_TABLET | Freq: Every day | ORAL | Status: DC

## 2015-03-24 NOTE — Telephone Encounter (Signed)
RF request for lisinopril LOV: 04/11/14 Next ov: 05/15/15 Last written: 12/09/14 #30 w/ 3RF

## 2015-05-01 DIAGNOSIS — M797 Fibromyalgia: Secondary | ICD-10-CM | POA: Diagnosis not present

## 2015-05-01 DIAGNOSIS — Z79891 Long term (current) use of opiate analgesic: Secondary | ICD-10-CM | POA: Diagnosis not present

## 2015-05-01 DIAGNOSIS — M0609 Rheumatoid arthritis without rheumatoid factor, multiple sites: Secondary | ICD-10-CM | POA: Diagnosis not present

## 2015-05-01 DIAGNOSIS — M150 Primary generalized (osteo)arthritis: Secondary | ICD-10-CM | POA: Diagnosis not present

## 2015-05-15 ENCOUNTER — Encounter: Payer: Self-pay | Admitting: Family Medicine

## 2015-05-29 DIAGNOSIS — M150 Primary generalized (osteo)arthritis: Secondary | ICD-10-CM | POA: Diagnosis not present

## 2015-05-29 DIAGNOSIS — Z79891 Long term (current) use of opiate analgesic: Secondary | ICD-10-CM | POA: Diagnosis not present

## 2015-05-29 DIAGNOSIS — M0579 Rheumatoid arthritis with rheumatoid factor of multiple sites without organ or systems involvement: Secondary | ICD-10-CM | POA: Diagnosis not present

## 2015-05-29 DIAGNOSIS — M797 Fibromyalgia: Secondary | ICD-10-CM | POA: Diagnosis not present

## 2015-06-05 ENCOUNTER — Encounter: Payer: Self-pay | Admitting: Family Medicine

## 2015-06-05 ENCOUNTER — Ambulatory Visit (INDEPENDENT_AMBULATORY_CARE_PROVIDER_SITE_OTHER): Payer: Medicare Other | Admitting: Family Medicine

## 2015-06-05 VITALS — BP 114/74 | HR 80 | Temp 98.6°F | Resp 20 | Wt 175.0 lb

## 2015-06-05 DIAGNOSIS — R1030 Lower abdominal pain, unspecified: Secondary | ICD-10-CM

## 2015-06-05 DIAGNOSIS — R319 Hematuria, unspecified: Secondary | ICD-10-CM

## 2015-06-05 DIAGNOSIS — N390 Urinary tract infection, site not specified: Secondary | ICD-10-CM | POA: Diagnosis not present

## 2015-06-05 DIAGNOSIS — R102 Pelvic and perineal pain: Secondary | ICD-10-CM

## 2015-06-05 DIAGNOSIS — N362 Urethral caruncle: Secondary | ICD-10-CM | POA: Diagnosis not present

## 2015-06-05 HISTORY — DX: Urinary tract infection, site not specified: N39.0

## 2015-06-05 HISTORY — DX: Pelvic and perineal pain: R10.2

## 2015-06-05 LAB — POCT URINALYSIS DIPSTICK
BILIRUBIN UA: NEGATIVE
GLUCOSE UA: NEGATIVE
Ketones, UA: NEGATIVE
Nitrite, UA: POSITIVE
PH UA: 7
Protein, UA: 100
SPEC GRAV UA: 1.015
Urobilinogen, UA: 0.2

## 2015-06-05 MED ORDER — ESTRADIOL 0.1 MG/GM VA CREA: TOPICAL_CREAM | VAGINAL | Status: DC

## 2015-06-05 MED ORDER — NITROFURANTOIN MONOHYD MACRO 100 MG PO CAPS: 100.0000 mg | ORAL_CAPSULE | Freq: Two times a day (BID) | ORAL | Status: DC

## 2015-06-05 NOTE — Progress Notes (Signed)
Patient ID: Erin Lopez, female   DOB: April 28, 1950, 65 y.o.   MRN: [id number]    Erin Lopez , 1950/12/11, 65 y.o., female MRN: [id number]  CC: HTN/Vaginal bleeding Subjective: Pt presents for an acute OV with complaints of "vaginal bleeding" that started yesterday. Associated symptoms include dysuria, urinary frequency, suprapubic pressure. Pt states the presure/pain, came at the same time as the bleeding. She is not certain if the bleeding is actually come from the vaginal area, it was on her toilet paper after wiping/urinating. Patient has never had vaginal bleeding since menopause in 2002. She is not sexually active. She denies any vaginal irritation, lesions or discharge. She has not noticed any bleeding in her underwear. She is not wearing a pad. She has a history of left kidney stone, with stent placement "a long time ago ", but does not feel the symptoms are similar. She denies any lower back pain.   Allergies  Allergen Reactions  . Ceftin [Cefuroxime Axetil] Anaphylaxis    Swelling of eyes and tongue  . Morphine And Related Other (See Comments)    Hallucinations/ disoriented  . Prednisone Other (See Comments)    Hyperactivity   Social History  Substance Use Topics  . Smoking status: Former Smoker -- 1.00 packs/day for 33 years    Types: Cigarettes    Quit date: 08/22/1993  . Smokeless tobacco: Never Used  . Alcohol Use: No     Comment: ALCOHOLIC IN REMISSION SINCE  2004   Past Medical History  Diagnosis Date  . Hypertension   . Bipolar 1 disorder [facility])     sees a psychiatrist  . Anxiety   . Hyperlipidemia     no meds in > a decade per pt  . COPD (chronic obstructive pulmonary disease) ([place])   . Fibromyalgia   . Alcoholism in remission [facility])     states last alcohol 11 yrs ago  . Vitamin D deficiency 05/2011  . Osteopenia 05/2010    repeat bone densitometry 3-5 yrs  . Memory loss     Abnl MRI brain and CT brain c/w chronic microvascular ischemia.  Repeat MRI  brain 02/2014 stable (Dr. Mayra Reel with no plan to f/u with neuro as of 04/2014)  . History of Helicobacter pylori infection 04/2008    +gastric biopsy (gastritis but no metaplasia, dysplasia, or malignancy identified)  . Environmental allergies   . Shortness of breath   . GERD (gastroesophageal reflux disease)   . Left ureteral calculus 08/2013    Perc nephr--cystoscopy w/ureteroscopy + laser for stone removal.  Residual asymptomatic left renal nephrolithiasis <45mm present post-procedure.  Marland Kitchen History of adenomatous polyp of colon 04/2008    No high grade dysplasia: recall 5 yrs ([name], [name])  . History of basal cell carcinoma excision     NOSE  . 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 TMJ (temporomandibular joint disorder)     USES  MOUTH GUARD  . Chronic renal insufficiency, stage II (mild)   . Cervical spondylosis without myelopathy 12/19/2013    MRI 02/2014: multilevel DDD/spondylosis, not much change compared to prior MRI.  Pt not in favor of invasive therapy for her neck as of 04/2014.  Marland Kitchen Sprain of neck 12/19/2013  . Internal hemorrhoids   . Hiatal hernia    Past Surgical History  Procedure Laterality Date  . Cervical spine surgery  08-27-1999  C6 -- T1  LAMINECTOMY/  DISKECTOMY  . Total knee arthroplasty Right 2003  . Cesarean section  1994  . Colonoscopy w/ polypectomy  04/2008;08/2014    2016 tubular adenoma x 1; +diverticulosis and int/ext hemorrhoids.  Recall 5 yrs  . Esophagogastroduodenoscopy  2010; 08/2014    +Candidal esophagitis; Mild chronic gastritis w/intestinal metaplasia--NEG H pylori, neg for eosinophilic esoph  . Nephrolithotomy Left 08/05/2013    Procedure: NEPHROLITHOTOMY PERCUTANEOUS;  Surgeon: [name], MD;  Location: [facility];  Service: Urology;  Laterality: Left;  . Cervical fusion  2002    anterior C5 -- C7  . Anterior cervical decomp/discectomy fusion   06-30-2009    C3 -- C5  AND EXPLORATION OF FUSION C5-7 W/  PLATE REMOVAL  . Tonsillectomy and adenoidectomy  1975  . Negative sleep study  04-01-2007  . Cystoscopy with ureteroscopy and stent placement Left 08/28/2013    Procedure: CYSTOSCOPY, RGP,  WITH URETEROSCOPY AND STENT REMOVAL;  Surgeon: [name], MD;  Location[facility];  Service: Urology;  Laterality: Left;  . Holmium laser application Left [id number]    Procedure: HOLMIUM LASER APPLICATION;  Surgeon: [name], MD;  Location: [facility];  Service: Urology;  Laterality: Left;   Family History  Problem Relation Age of Onset  . Alcohol abuse Mother   . Arthritis Mother   . Hypertension Mother   . Hyperlipidemia Mother   . Diabetes Mother   . Parkinsonism Mother   . Esophageal cancer Sister   . Prostate cancer Father   . Hypertension Father   . Hyperlipidemia Father   . Diabetes Father   . Colon polyps Sister   . Colon polyps Brother   . Breast cancer Mother   . Heart disease Father   . Heart disease Brother     x 2  . Irritable bowel syndrome Sister      Medication List       This list is accurate as of: 06/05/15  2:45 PM.  Always use your most recent med list.               ALPRAZolam 1 MG tablet  Commonly known as:  XANAX  Take 5 mg by mouth 2 (two) times daily.     carvedilol 12.5 MG tablet  Commonly known as:  COREG  Take 1 tablet (12.5 mg total) by mouth 2 (two) times daily with a meal.     cetirizine 10 MG tablet  Commonly known as:  ZYRTEC  Take 10 mg by mouth every morning.     cyclobenzaprine 10 MG tablet  Commonly known as:  FLEXERIL     doxycycline 50 MG capsule  Commonly known as:  VIBRAMYCIN  TAKE ONE CAPSULE BY MOUTH TWICE A DAY WITH FOOD     fluticasone 50 MCG/ACT nasal spray  Commonly known as:  FLONASE  Place 2 sprays into both nostrils daily.     lamoTRIgine 100 MG tablet  Commonly known as:  LAMICTAL  Take 3 tablets (300 mg total) by  mouth at bedtime.     lisinopril 30 MG tablet  Commonly known as:  PRINIVIL,ZESTRIL  Take 1 tablet (30 mg total) by mouth daily.     montelukast 10 MG tablet  Commonly known as:  SINGULAIR  Take 1 tablet (10 mg total) by mouth every evening.     oxyCODONE 15 MG immediate release tablet  Commonly known as:  ROXICODONE  Take 15 mg by  mouth 3 (three) times daily.     polyethylene glycol powder powder  Commonly known as:  GLYCOLAX/MIRALAX  Take 17 g by mouth daily.     PROAIR HFA 108 (90 Base) MCG/ACT inhaler  Generic drug:  albuterol  INHALE 2 PUFFS INTO THE LUNGS EVERY 4 (FOUR) HOURS AS NEEDED FOR WHEEZING OR SHORTNESS OF BREATH.     ranitidine 150 MG capsule  Commonly known as:  ZANTAC  Take 150 mg by mouth 2 (two) times daily as needed.     tiotropium 18 MCG inhalation capsule  Commonly known as:  SPIRIVA  Place 1 capsule (18 mcg total) into inhaler and inhale every morning.     venlafaxine XR 150 MG 24 hr capsule  Commonly known as:  EFFEXOR-XR  Take 1 capsule (150 mg total) by mouth daily with breakfast.     zolpidem 10 MG tablet  Commonly known as:  AMBIEN  Take 10 mg by mouth at bedtime.         ROS: Negative, with the exception of above mentioned in HPI   Objective:  BP 114/74 mmHg  Pulse 80  Temp(Src) 98.6 F (37 C)  Resp 20  Wt 175 lb (79.379 kg)  SpO2 96% Body mass index is 28.02 kg/(m^2). Gen: Afebrile. No acute distress. Nontoxic in appearance, well-developed, well-nourished, Caucasian female. HENT: AT. [place].MMM, no oral lesions.  Eyes:Pupils Equal Round Reactive to light, Extraocular movements intact,  Conjunctiva without redness, discharge or icterus. Abd: Soft. Round. ND. Suprapubic tenderness. BS present. No Masses palpated.  GYN:  External genitalia with vaginal atrophy. Very small right urethral caruncle. Vaginal mucosa mildly dry. moist, normal rugae.  Nonfriable cervix without lesions, no discharge or vaginal bleeding noted on speculum exam.     Assessment/Plan: Erin Lopez is a 65 y.o. female present for acute OV for  1. Suprapubic discomfort, unspecified laterality - POCT Urinalysis Dipstick--> large leukocytes, positive nitrites, large blood. - Urinalysis, Routine w reflex microscopic - Urine Culture  2. Urethral caruncle - Very small urethral caruncle, discussed treatment with topical estrogen. Patient was instructed and educated on proper use in location with diagram.   3. Urinary tract infection with hematuria, site unspecified - Started Macrobid, patient allergic to cephalosporins. - Urine culture sent, if not sensitive Macrobid will need to change therapy. - nitrofurantoin, macrocrystal-monohydrate, (MACROBID) 100 MG capsule; Take 1 capsule (100 mg total) by mouth 2 (two) times daily.  Dispense: 14 capsule; Refill: 0  electronically signed by:  Howard Pouch, DO  [place]

## 2015-06-05 NOTE — Patient Instructions (Signed)

## 2015-06-06 LAB — URINALYSIS, ROUTINE W REFLEX MICROSCOPIC
BILIRUBIN URINE: NEGATIVE
GLUCOSE, UA: NEGATIVE
KETONES UR: NEGATIVE
Nitrite: POSITIVE — AB
Specific Gravity, Urine: 1.016 (ref 1.001–1.035)
pH: 7 (ref 5.0–8.0)

## 2015-06-06 LAB — URINALYSIS, MICROSCOPIC ONLY
CASTS: NONE SEEN [LPF]
CRYSTALS: NONE SEEN [HPF]
YEAST: NONE SEEN [HPF]

## 2015-06-07 LAB — URINE CULTURE: Colony Count: 8000

## 2015-06-08 ENCOUNTER — Telehealth: Payer: Self-pay | Admitting: Family Medicine

## 2015-06-08 NOTE — Telephone Encounter (Signed)
Called patient. Gave lab results. Patient verbalized understanding.  

## 2015-06-08 NOTE — Telephone Encounter (Signed)
Please call pt: - her urine culture did not show significant growth of a particular bacteria. I would recommend she finish the medication and follow up if symptoms are not resolved.

## 2015-06-11 ENCOUNTER — Ambulatory Visit (INDEPENDENT_AMBULATORY_CARE_PROVIDER_SITE_OTHER): Payer: Medicare Other | Admitting: Family Medicine

## 2015-06-11 ENCOUNTER — Encounter: Payer: Self-pay | Admitting: Family Medicine

## 2015-06-11 VITALS — BP 109/68 | HR 70 | Temp 98.2°F | Ht 67.0 in | Wt 174.0 lb

## 2015-06-11 DIAGNOSIS — Z0000 Encounter for general adult medical examination without abnormal findings: Secondary | ICD-10-CM

## 2015-06-11 DIAGNOSIS — Z124 Encounter for screening for malignant neoplasm of cervix: Secondary | ICD-10-CM

## 2015-06-11 DIAGNOSIS — R1030 Lower abdominal pain, unspecified: Secondary | ICD-10-CM | POA: Diagnosis not present

## 2015-06-11 DIAGNOSIS — Z87442 Personal history of urinary calculi: Secondary | ICD-10-CM | POA: Diagnosis not present

## 2015-06-11 DIAGNOSIS — J449 Chronic obstructive pulmonary disease, unspecified: Secondary | ICD-10-CM

## 2015-06-11 DIAGNOSIS — R319 Hematuria, unspecified: Secondary | ICD-10-CM | POA: Diagnosis not present

## 2015-06-11 DIAGNOSIS — N362 Urethral caruncle: Secondary | ICD-10-CM | POA: Diagnosis not present

## 2015-06-11 LAB — COMPREHENSIVE METABOLIC PANEL
ALK PHOS: 51 U/L (ref 39–117)
ALT: 11 U/L (ref 0–35)
AST: 14 U/L (ref 0–37)
Albumin: 4.5 g/dL (ref 3.5–5.2)
BILIRUBIN TOTAL: 0.6 mg/dL (ref 0.2–1.2)
BUN: 12 mg/dL (ref 6–23)
CO2: 32 mEq/L (ref 19–32)
CREATININE: 0.94 mg/dL (ref 0.40–1.20)
Calcium: 9.4 mg/dL (ref 8.4–10.5)
Chloride: 101 mEq/L (ref 96–112)
GFR: 64.04 mL/min (ref >60.00)
Glucose, Bld: 87 mg/dL (ref 70–99)
Potassium: 4.6 mEq/L (ref 3.5–5.1)
Sodium: 139 mEq/L (ref 135–145)
TOTAL PROTEIN: 6.5 g/dL (ref 6.0–8.3)

## 2015-06-11 LAB — LIPID PANEL
CHOLESTEROL: 235 mg/dL — AB (ref 0–200)
HDL: 61.4 mg/dL (ref >39.00)
LDL Cholesterol: 151 mg/dL — ABNORMAL HIGH (ref 0–99)
NonHDL: 173.21
TRIGLYCERIDES: 109 mg/dL (ref 0.0–149.0)
Total CHOL/HDL Ratio: 4
VLDL: 21.8 mg/dL (ref 0.0–40.0)

## 2015-06-11 LAB — CBC WITH DIFFERENTIAL/PLATELET
BASOS PCT: 0.5 % (ref 0.0–3.0)
Basophils Absolute: 0 10*3/uL (ref 0.0–0.1)
EOS PCT: 5.9 % — AB (ref 0.0–5.0)
Eosinophils Absolute: 0.4 10*3/uL (ref 0.0–0.7)
HEMATOCRIT: 35.4 % — AB (ref 36.0–46.0)
HEMOGLOBIN: 12.1 g/dL (ref 12.0–15.0)
LYMPHS PCT: 27.6 % (ref 12.0–46.0)
Lymphs Abs: 1.9 10*3/uL (ref 0.7–4.0)
MCHC: 34.1 g/dL (ref 30.0–36.0)
MCV: 88.4 fl (ref 78.0–100.0)
MONO ABS: 0.7 10*3/uL (ref 0.1–1.0)
Monocytes Relative: 9.8 % (ref 3.0–12.0)
Neutro Abs: 3.8 10*3/uL (ref 1.4–7.7)
Neutrophils Relative %: 56.2 % (ref 43.0–77.0)
Platelets: 279 10*3/uL (ref 150.0–400.0)
RBC: 4 Mil/uL (ref 3.87–5.11)
RDW: 13.1 % (ref 11.5–15.5)
WBC: 6.8 10*3/uL (ref 4.0–10.5)

## 2015-06-11 LAB — TSH: TSH: 1.1 u[IU]/mL (ref 0.35–4.50)
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
## 2015-06-12 LAB — URINE CULTURE
Colony Count: NO GROWTH
Organism ID, Bacteria: NO GROWTH

## 2015-06-16 ENCOUNTER — Ambulatory Visit
Admission: RE | Admit: 2015-06-16 | Discharge: 2015-06-16 | Disposition: A | Discharge status: Home / Self Care | Payer: Medicare Other | Source: Ambulatory Visit | Attending: Family Medicine | Admitting: Family Medicine

## 2015-06-16 ENCOUNTER — Encounter: Payer: Self-pay | Admitting: Pulmonary Disease

## 2015-06-16 ENCOUNTER — Ambulatory Visit (INDEPENDENT_AMBULATORY_CARE_PROVIDER_SITE_OTHER): Payer: Medicare Other | Admitting: Pulmonary Disease

## 2015-06-16 VITALS — BP 124/74 | HR 76 | Ht 68.0 in | Wt 176.0 lb

## 2015-06-16 DIAGNOSIS — J439 Emphysema, unspecified: Secondary | ICD-10-CM | POA: Diagnosis not present

## 2015-06-16 DIAGNOSIS — Z87442 Personal history of urinary calculi: Secondary | ICD-10-CM

## 2015-06-16 DIAGNOSIS — R319 Hematuria, unspecified: Secondary | ICD-10-CM

## 2015-06-16 DIAGNOSIS — R1030 Lower abdominal pain, unspecified: Secondary | ICD-10-CM

## 2015-06-16 DIAGNOSIS — N200 Calculus of kidney: Secondary | ICD-10-CM | POA: Diagnosis not present

## 2015-06-16 NOTE — Patient Instructions (Signed)
We will give a sample of Advair to try. We'll get spirometry today in the office to check her lung function.  Return to clinic in 3 months.

## 2015-06-16 NOTE — Progress Notes (Addendum)
Subjective:    Patient ID: Erin Lopez, female    DOB: 1950-07-21, 65 y.o.   MRN: [id number]  HPI Consult for evaluation, management of COPD.  Mrs. Giger is a 65 year old with smoking history. She has been referred for management of her COPD. She has dyspnea on exertion at baseline but has noticed worsening symptoms over the past 6-8 weeks. This is accompanied occasionally wheezing. She does not have symptoms at rest. She does not have any cough, sputum production, fevers, chills. She has been maintained on Spiriva for many years now but does not know if this has made any difference to her breathing. She had a negative sleep study a few years ago.  DATA: CT abd 08/06/13. Images reviewed Linear bilateral lower lobe atelectasis or scarring noted. Areas of mild bronchiectasis are noted.   Barium swallow 04/18/08 Minimal hiatal hernia. GE reflux.  Social History: 30-pack-year smoking history. Quit in 1995. Alcohol free since 2004. She is to work at [employer] [place]. No exposures at work or at home.  Family history: Mother-alcohol abuse, arthritis, diabetes, hyperlipidemia, hypertension, Parkinson's Father-diabetes, heart disease, hyperlipidemia, hypertension, prostate cancer  Past Medical History  Diagnosis Date  . Hypertension   . Bipolar 1 disorder [facility])     sees a psychiatrist  . Anxiety   . Hyperlipidemia     no meds in > a decade per pt  . COPD (chronic obstructive pulmonary disease) ([place])   . Fibromyalgia   . Alcoholism in remission [facility])     states last alcohol 11 yrs ago  . Vitamin D deficiency 05/2011  . Osteopenia 05/2010    repeat bone densitometry 3-5 yrs  . Memory loss     Abnl MRI brain and CT brain c/w chronic microvascular ischemia.  Repeat MRI brain 02/2014 stable (Dr. Mayra Reel with no plan to f/u with neuro as of 04/2014)  . History of Helicobacter pylori infection 04/2008    +gastric biopsy (gastritis but no metaplasia, dysplasia, or malignancy  identified)  . Environmental allergies   . Shortness of breath   . GERD (gastroesophageal reflux disease)   . Left ureteral calculus 08/2013    Perc nephr--cystoscopy w/ureteroscopy + laser for stone removal.  Residual asymptomatic left renal nephrolithiasis <39mm present post-procedure.  Marland Kitchen History of adenomatous polyp of colon 04/2008    No high grade dysplasia: recall 5 yrs ([name], [name])  . History of basal cell carcinoma excision     NOSE  . 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 TMJ (temporomandibular joint disorder)     USES  MOUTH GUARD  . Chronic renal insufficiency, stage II (mild)   . Cervical spondylosis without myelopathy 12/19/2013    MRI 02/2014: multilevel DDD/spondylosis, not much change compared to prior MRI.  Pt not in favor of invasive therapy for her neck as of 04/2014.  Marland Kitchen Sprain of neck 12/19/2013  . Internal hemorrhoids   . Hiatal hernia     Current outpatient prescriptions:  .  ALPRAZolam (XANAX) 1 MG tablet, Take 1 mg by mouth 3 (three) times daily as needed. , Disp: , Rfl:  .  carvedilol (COREG) 12.5 MG tablet, Take 1 tablet (12.5 mg total) by mouth 2 (two) times daily with a meal., Disp: 60 tablet, Rfl: 11 .  cetirizine (ZYRTEC) 10 MG tablet, Take 10 mg by mouth every morning. , Disp: , Rfl:  .  cyclobenzaprine (  FLEXERIL) 10 MG tablet, Take 10 mg by mouth 2 (two) times daily as needed. , Disp: , Rfl:  .  doxycycline (VIBRAMYCIN) 50 MG capsule, TAKE ONE CAPSULE BY MOUTH TWICE A DAY WITH FOOD, Disp: , Rfl: 3 .  estradiol (ESTRACE) 0.1 MG/GM vaginal cream, Place small amount cream near urethral area nightly., Disp: 42.5 g, Rfl: 5 .  fluticasone (FLONASE) 50 MCG/ACT nasal spray, Place 2 sprays into both nostrils daily., Disp: 16 g, Rfl: 12 .  lamoTRIgine (LAMICTAL) 100 MG tablet, Take 3 tablets (300 mg total) by mouth at bedtime., Disp: 90 tablet, Rfl: 11 .  lisinopril  (PRINIVIL,ZESTRIL) 30 MG tablet, Take 1 tablet (30 mg total) by mouth daily., Disp: 30 tablet, Rfl: 12 .  montelukast (SINGULAIR) 10 MG tablet, Take 1 tablet (10 mg total) by mouth every evening., Disp: 30 tablet, Rfl: 12 .  Oxycodone HCl 10 MG TABS, Take 1 tablet by mouth 3 (three) times daily., Disp: , Rfl:  .  polyethylene glycol powder (GLYCOLAX/MIRALAX) powder, Take 17 g by mouth daily., Disp: 3162 g, Rfl: 12 .  PROAIR HFA 108 (90 BASE) MCG/ACT inhaler, INHALE 2 PUFFS INTO THE LUNGS EVERY 4 (FOUR) HOURS AS NEEDED FOR WHEEZING OR SHORTNESS OF BREATH., Disp: 8.5 each, Rfl: 1 .  ranitidine (ZANTAC) 150 MG capsule, Take 150 mg by mouth 2 (two) times daily as needed. , Disp: , Rfl:  .  tiotropium (SPIRIVA) 18 MCG inhalation capsule, Place 1 capsule (18 mcg total) into inhaler and inhale every morning., Disp: 30 capsule, Rfl: 3 .  venlafaxine XR (EFFEXOR-XR) 150 MG 24 hr capsule, Take 1 capsule (150 mg total) by mouth daily with breakfast., Disp: 30 capsule, Rfl: 11 .  zolpidem (AMBIEN) 10 MG tablet, Take 10 mg by mouth at bedtime., Disp: , Rfl:     Review of Systems Dyspnea on exertion, wheezing. No cough, sputum production, hemoptysis. No fevers, chills, loss of weight, appetite, malaise, fatigue. No chest pain, palpitation. No nausea, vomiting, diarrhea, consultation. All other review of systems are negative.    Objective:   Physical Exam  Blood pressure 124/74, pulse 76, height 5\' 8"  (1.727 m), weight 176 lb (79.833 kg), SpO2 93 %. Gen: No apparent distress Neuro: No gross focal deficits. Neck: No JVD, lymphadenopathy, thyromegaly. RS: Clear, No wheeze or crackles CVS: S1-S2 heard, no murmurs rubs gallops. Abdomen: Soft, positive bowel sounds. Extremities: No edema.       ``    Assessment & Plan:  COPD  She is maintained on Spiriva but has increasing dyspnea on exertion. I will try her on Advair. She wants samples to try out before prescription as she would like to avoid the co-pay  if possible. I discussed getting full PFTs on her but she cannot afford the additional cost. Instead we will just do a spirometry in the office today.  CT scan abdomen from 2015 reviewed. It shows bibasilar atelectasis, scarring, bronchiectasis. She does have dysphagia, H pylori gastritis, GERD, hiatal hernia. I wonder if she has chronic aspiration. We'll continue to monitor this. She may need a referral back to GI.   Plan: - Try advair 250/50, Continue spiriva - Spirometry.  Marshell Garfinkel MD [facility] Pager [phone] If no answer or after 3pm call: [phone] 06/16/2015, 3:36 PM   Addendum: Spirometry 06/16/15 FVC 3.01 (85%) FEV1 1.65 (60%) F/F 55. Moderate airway obstruction.

## 2015-06-18 ENCOUNTER — Telehealth: Payer: Self-pay | Admitting: Family Medicine

## 2015-06-18 NOTE — Telephone Encounter (Signed)
Discussed results with pt. She wants to do nothing about the stone at this time. She has appt with GYN MD soon.

## 2015-06-18 NOTE — Telephone Encounter (Signed)
Please advise. Thanks.  

## 2015-06-18 NOTE — Telephone Encounter (Signed)
Patient requesting CB about her xray results. She will be available until 5:30PM today.

## 2015-06-24 ENCOUNTER — Telehealth: Payer: Self-pay | Admitting: Pulmonary Disease

## 2015-06-24 MED ORDER — FLUTICASONE-SALMETEROL 250-50 MCG/DOSE IN AEPB: 1.0000 | INHALATION_SPRAY | Freq: Two times a day (BID) | RESPIRATORY_TRACT | Status: DC

## 2015-06-24 NOTE — Telephone Encounter (Signed)
Spoke with the pt  She states her breathing is much improved on the advair 250/50  I have sent rx to her pharm  Nothing further needed

## 2015-06-24 NOTE — Telephone Encounter (Signed)
LM x 1 for pt 

## 2015-06-25 DIAGNOSIS — Z6827 Body mass index (BMI) 27.0-27.9, adult: Secondary | ICD-10-CM | POA: Diagnosis not present

## 2015-06-25 DIAGNOSIS — Z01419 Encounter for gynecological examination (general) (routine) without abnormal findings: Secondary | ICD-10-CM | POA: Diagnosis not present

## 2015-06-25 DIAGNOSIS — R102 Pelvic and perineal pain: Secondary | ICD-10-CM | POA: Diagnosis not present

## 2015-06-25 DIAGNOSIS — Z124 Encounter for screening for malignant neoplasm of cervix: Secondary | ICD-10-CM | POA: Diagnosis not present

## 2015-06-25 DIAGNOSIS — R109 Unspecified abdominal pain: Secondary | ICD-10-CM | POA: Diagnosis not present

## 2015-06-25 HISTORY — PX: ULTRASOUND EXAM,PELVIC COMPLETE (ARMC HX): HXRAD1194

## 2015-06-25 LAB — HM PAP SMEAR: HM Pap smear: NORMAL

## 2015-06-29 ENCOUNTER — Other Ambulatory Visit: Payer: Self-pay | Admitting: *Deleted

## 2015-06-29 DIAGNOSIS — Z1231 Encounter for screening mammogram for malignant neoplasm of breast: Secondary | ICD-10-CM | POA: Diagnosis not present

## 2015-06-29 DIAGNOSIS — Z803 Family history of malignant neoplasm of breast: Secondary | ICD-10-CM | POA: Diagnosis not present

## 2015-06-29 DIAGNOSIS — M810 Age-related osteoporosis without current pathological fracture: Secondary | ICD-10-CM | POA: Diagnosis not present

## 2015-06-29 LAB — HM MAMMOGRAPHY

## 2015-06-29 MED ORDER — CARVEDILOL 12.5 MG PO TABS: 12.5000 mg | ORAL_TABLET | Freq: Two times a day (BID) | ORAL | Status: DC

## 2015-06-29 NOTE — Telephone Encounter (Signed)
RF request for carvedilol LOV: 06/11/15 Next ov: 09/17/15 Last written: 07/25/14 #60 w/ 11RF

## 2015-07-02 ENCOUNTER — Encounter: Payer: Self-pay | Admitting: Family Medicine

## 2015-07-03 ENCOUNTER — Telehealth: Payer: Self-pay | Admitting: *Deleted

## 2015-07-03 DIAGNOSIS — M797 Fibromyalgia: Secondary | ICD-10-CM | POA: Diagnosis not present

## 2015-07-03 DIAGNOSIS — M0609 Rheumatoid arthritis without rheumatoid factor, multiple sites: Secondary | ICD-10-CM | POA: Diagnosis not present

## 2015-07-03 DIAGNOSIS — M150 Primary generalized (osteo)arthritis: Secondary | ICD-10-CM | POA: Diagnosis not present

## 2015-07-03 DIAGNOSIS — Z79891 Long term (current) use of opiate analgesic: Secondary | ICD-10-CM | POA: Diagnosis not present

## 2015-07-03 NOTE — Telephone Encounter (Signed)
Please advise of BMD and Mammogram results when they come in. Thanks.   Pt called back. She stated that she has her BMD and mammogram on Monday. I advised her that we have not yet received the results of those test. She stated that she wanted to know how her creatine and iron levels were I advised her that her labs from 3 weeks ago were stable and gave her the number of her creatinine. She voiced understanding. No further questions were asked.   Tried calling pt NA and unable to leave a message.   Pt LMOM on 07/03/15 at 12:37pm requesting results from her mammogram and BMD. She also stated that she has questions about her labs.

## 2015-07-13 ENCOUNTER — Encounter: Payer: Self-pay | Admitting: Family Medicine

## 2015-07-13 NOTE — Telephone Encounter (Signed)
I have not seen a mammogram result. I have the DEXA result: she has osteoporosis and needs to start fosamax 70mg , 1 tab po q week, #4, RF x 12. Pls eRx this med.  Remind her to take with a full glass of water on empty stomach and remain upright for 30 min after taking it.  Continue to take calcium 1500 mg per day and Vit D 800 Units per day. We'll repeat her bone density testing in 2 yrs.

## 2015-07-14 NOTE — Telephone Encounter (Signed)
Left message for pt to call back  °

## 2015-07-16 ENCOUNTER — Encounter: Payer: Self-pay | Admitting: Family Medicine

## 2015-07-16 ENCOUNTER — Other Ambulatory Visit: Payer: Self-pay | Admitting: *Deleted

## 2015-07-16 MED ORDER — ALENDRONATE SODIUM 70 MG PO TABS: 70.0000 mg | ORAL_TABLET | ORAL | Status: DC

## 2015-07-16 NOTE — Telephone Encounter (Signed)
Pt advised and voiced understanding.  She stated that she has received the results of her mammogram. She requested that Rx be sent to [place]. Rx sent. She stated that she was on a medication like this in the past and did not do well with it. She stated that she is going to look up some information on this medication before she picks it up.

## 2015-07-29 ENCOUNTER — Encounter: Payer: Self-pay | Admitting: Family Medicine

## 2015-07-31 DIAGNOSIS — Z79891 Long term (current) use of opiate analgesic: Secondary | ICD-10-CM | POA: Diagnosis not present

## 2015-07-31 DIAGNOSIS — M797 Fibromyalgia: Secondary | ICD-10-CM | POA: Diagnosis not present

## 2015-07-31 DIAGNOSIS — M150 Primary generalized (osteo)arthritis: Secondary | ICD-10-CM | POA: Diagnosis not present

## 2015-08-25 ENCOUNTER — Other Ambulatory Visit: Payer: Self-pay | Admitting: *Deleted

## 2015-08-25 MED ORDER — VENLAFAXINE HCL ER 150 MG PO CP24: 150.0000 mg | ORAL_CAPSULE | Freq: Every day | ORAL | Status: DC

## 2015-08-25 NOTE — Telephone Encounter (Signed)
RF request for venlafaxine LOV: 06/11/15 Next ov: 09/17/15 Last written: 08/26/14 #30 w/ 11RF

## 2015-08-28 DIAGNOSIS — Z79891 Long term (current) use of opiate analgesic: Secondary | ICD-10-CM | POA: Diagnosis not present

## 2015-08-28 DIAGNOSIS — M797 Fibromyalgia: Secondary | ICD-10-CM | POA: Diagnosis not present

## 2015-08-28 DIAGNOSIS — M150 Primary generalized (osteo)arthritis: Secondary | ICD-10-CM | POA: Diagnosis not present

## 2015-09-17 ENCOUNTER — Telehealth: Payer: Self-pay | Admitting: *Deleted

## 2015-09-17 ENCOUNTER — Encounter: Payer: Self-pay | Admitting: Family Medicine

## 2015-09-17 ENCOUNTER — Ambulatory Visit (INDEPENDENT_AMBULATORY_CARE_PROVIDER_SITE_OTHER): Payer: Medicare Other | Admitting: Family Medicine

## 2015-09-17 ENCOUNTER — Ambulatory Visit: Payer: Medicare Other | Admitting: Family Medicine

## 2015-09-17 VITALS — BP 157/93 | HR 69 | Temp 98.9°F | Resp 16 | Ht 68.0 in | Wt 171.5 lb

## 2015-09-17 DIAGNOSIS — M810 Age-related osteoporosis without current pathological fracture: Secondary | ICD-10-CM | POA: Diagnosis not present

## 2015-09-17 DIAGNOSIS — I10 Essential (primary) hypertension: Secondary | ICD-10-CM | POA: Diagnosis not present

## 2015-09-17 DIAGNOSIS — M797 Fibromyalgia: Secondary | ICD-10-CM | POA: Diagnosis not present

## 2015-09-17 DIAGNOSIS — J438 Other emphysema: Secondary | ICD-10-CM

## 2015-09-17 DIAGNOSIS — F319 Bipolar disorder, unspecified: Secondary | ICD-10-CM

## 2015-09-17 MED ORDER — VENLAFAXINE HCL ER 150 MG PO CP24: 150.0000 mg | ORAL_CAPSULE | Freq: Every day | ORAL | Status: DC

## 2015-09-17 MED ORDER — LAMOTRIGINE 100 MG PO TABS: 300.0000 mg | ORAL_TABLET | Freq: Every day | ORAL | Status: DC

## 2015-09-17 MED ORDER — ALBUTEROL SULFATE HFA 108 (90 BASE) MCG/ACT IN AERS: INHALATION_SPRAY | RESPIRATORY_TRACT | Status: DC

## 2015-09-17 MED ORDER — FLUTICASONE PROPIONATE 50 MCG/ACT NA SUSP: 2.0000 | Freq: Every day | NASAL | Status: DC

## 2015-09-17 NOTE — Telephone Encounter (Signed)
Per Dr. Isla Pence request. Please start process to get pt on Prolia injections. Thanks.

## 2015-09-17 NOTE — Progress Notes (Signed)
Pre visit review using our clinic review tool, if applicable. No additional management support is needed unless otherwise documented below in the visit note. 

## 2015-09-17 NOTE — Progress Notes (Signed)
OFFICE VISIT  09/17/2015   CC:  Chief Complaint  Patient presents with  . Follow-up    Pt is not fasting.    HPI:    Patient is a 65 y.o. Caucasian female who presents for 3 mo f/u HTN, fibromyalgia, COPD, and bipolar d/o. Saw pulmonologist 06/16/15 and spiriva was stopped and advair was started (250/50) and she says she thinks she is breathing a bit better.  She RARELY uses rescue albut inhaler--asks for RF b/c current one expired.   Her fibromyalgia effects her daily, roving pain, sometimes more intense than others.  Occ takes flexeril. Cymbalta trial was no help in the past.  Takes oxycodone when she gets bad hip or neck pain, sometimes knees. Takes oxycodone 10mg  tid regularly.  This is prescribed by her rheum MD , Dr. Barkley Boards (for osteoarthritis).  Has checked bp a couple times at home since last visit and it was normal.  Says she has been unable to tolerate the fosamax I rx'd her for osteoporosis.  Causes GI upset and diarrhea. She asks about getting prolia injection.  Mood has been stable and she has been compliant with her psych meds.  ROS: no HAs, dizziness, CP, or vision complaints.    Past Medical History  Diagnosis Date  . Hypertension   . Bipolar 1 disorder [facility])     sees a psychiatrist  . Anxiety   . Hyperlipidemia     no meds in > a decade per pt  . COPD (chronic obstructive pulmonary disease) ([place])   . Fibromyalgia   . Alcoholism in remission [facility])     states last alcohol 11 yrs ago  . Vitamin D deficiency 05/2011  . Osteoporosis 05/2010    2012 'penia; 06/2015 'porosis:  repeat DEXA 2 yrs  . Memory loss     Abnl MRI brain and CT brain c/w chronic microvascular ischemia.  Repeat MRI brain 02/2014 stable (Dr. Mayra Reel with no plan to f/u with neuro as of 04/2014)  . History of Helicobacter pylori infection 04/2008    +gastric biopsy (gastritis but no metaplasia, dysplasia, or malignancy identified)  . Environmental allergies   . Shortness of breath   .  GERD (gastroesophageal reflux disease)   . Left ureteral calculus 08/2013    Perc nephr--cystoscopy w/ureteroscopy + laser for stone removal.  Residual asymptomatic left renal nephrolithiasis <73mm present post-procedure.  Marland Kitchen History of adenomatous polyp of colon 04/2008; 08/2014    No high grade dysplasia: recall 5 yrs ([name], [name])  . History of basal cell carcinoma excision     NOSE  . 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 TMJ (temporomandibular joint disorder)     USES  MOUTH GUARD  . Chronic renal insufficiency, stage II (mild)   . Cervical spondylosis without myelopathy 12/19/2013    MRI 02/2014: multilevel DDD/spondylosis, not much change compared to prior MRI.  Pt not in favor of invasive therapy for her neck as of 04/2014.  Marland Kitchen Sprain of neck 12/19/2013  . Internal hemorrhoids   . Hiatal hernia     Past Surgical History  Procedure Laterality Date  . Cervical spine surgery  08-27-1999      C6 -- T1  LAMINECTOMY/  DISKECTOMY  . Total knee arthroplasty Right 2003  . Cesarean section  1994  . Colonoscopy w/ polypectomy  04/2008;08/2014    2016 tubular adenoma x 1; +diverticulosis and  int/ext hemorrhoids.  Recall 5 yrs  . Esophagogastroduodenoscopy  2010; 08/2014    +Candidal esophagitis; Mild chronic gastritis w/intestinal metaplasia--NEG H pylori, neg for eosinophilic esoph  . Nephrolithotomy Left 08/05/2013    Procedure: NEPHROLITHOTOMY PERCUTANEOUS;  Surgeon: [name], MD;  Location: [facility];  Service: Urology;  Laterality: Left;  . Cervical fusion  2002    anterior C5 -- C7  . Anterior cervical decomp/discectomy fusion  06-30-2009    C3 -- C5  AND EXPLORATION OF FUSION C5-7 W/  PLATE REMOVAL  . Tonsillectomy and adenoidectomy  1975  . Negative sleep study  04-01-2007  . Cystoscopy with ureteroscopy and stent placement Left 08/28/2013    Procedure: CYSTOSCOPY, RGP,  WITH URETEROSCOPY AND STENT  REMOVAL;  Surgeon: [name], MD;  Location: [facility];  Service: Urology;  Laterality: Left;  . Holmium laser application Left [id number]    Procedure: HOLMIUM LASER APPLICATION;  Surgeon: [name], MD;  Location: [facility];  Service: Urology;  Laterality: Left;  . Ultrasound exam,pelvic complete ([facility] hx)  06/25/15    NORMAL (done by GYN)    Outpatient Prescriptions Prior to Visit  Medication Sig Dispense Refill  . ALPRAZolam (XANAX) 1 MG tablet Take 1 mg by mouth 3 (three) times daily as needed.     . carvedilol (COREG) 12.5 MG tablet Take 1 tablet (12.5 mg total) by mouth 2 (two) times daily with a meal. 60 tablet 11  . cetirizine (ZYRTEC) 10 MG tablet Take 10 mg by mouth every morning.     . cyclobenzaprine (FLEXERIL) 10 MG tablet Take 10 mg by mouth 2 (two) times daily as needed.     . Fluticasone-Salmeterol (ADVAIR) 250-50 MCG/DOSE AEPB Inhale 1 puff into the lungs 2 (two) times daily. 60 each 5  . lisinopril (PRINIVIL,ZESTRIL) 30 MG tablet Take 1 tablet (30 mg total) by mouth daily. 30 tablet 12  . montelukast (SINGULAIR) 10 MG tablet Take 1 tablet (10 mg total) by mouth every evening. 30 tablet 12  . Oxycodone HCl 10 MG TABS Take 1 tablet by mouth 3 (three) times daily.    . polyethylene glycol powder (GLYCOLAX/MIRALAX) powder Take 17 g by mouth daily. 3162 g 12  . ranitidine (ZANTAC) 150 MG capsule Take 150 mg by mouth 2 (two) times daily as needed.     . zolpidem (AMBIEN) 10 MG tablet Take 10 mg by mouth at bedtime.    . fluticasone (FLONASE) 50 MCG/ACT nasal spray Place 2 sprays into both nostrils daily. 16 g 12  . lamoTRIgine (LAMICTAL) 100 MG tablet Take 3 tablets (300 mg total) by mouth at bedtime. 90 tablet 11  . PROAIR HFA 108 (90 BASE) MCG/ACT inhaler INHALE 2 PUFFS INTO THE LUNGS EVERY 4 (FOUR) HOURS AS NEEDED FOR WHEEZING OR SHORTNESS OF BREATH. 8.5 each 1  . venlafaxine XR (EFFEXOR-XR) 150 MG 24 hr capsule Take 1 capsule (150 mg  total) by mouth daily with breakfast. 30 capsule 11  . alendronate (FOSAMAX) 70 MG tablet Take 1 tablet (70 mg total) by mouth every 7 (seven) days. Take with a full glass of water on an empty stomach, stay upright for 30 min after taking med. (Patient not taking: Reported on 09/17/2015) 4 tablet 12  . doxycycline (VIBRAMYCIN) 50 MG capsule Reported on 09/17/2015  3  . estradiol (ESTRACE) 0.1 MG/GM vaginal cream Place small amount cream near urethral area nightly. (Patient not taking: Reported on 09/17/2015) 42.5 g 5  .  tiotropium (SPIRIVA) 18 MCG inhalation capsule Place 1 capsule (18 mcg total) into inhaler and inhale every morning. (Patient not taking: Reported on 09/17/2015) 30 capsule 3   No facility-administered medications prior to visit.    Allergies  Allergen Reactions  . Ceftin [Cefuroxime Axetil] Anaphylaxis    Swelling of eyes and tongue  . Morphine And Related Other (See Comments)    Hallucinations/ disoriented  . Prednisone Other (See Comments)    Hyperactivity    ROS As per HPI  PE: Blood pressure 157/93, pulse 69, temperature 98.9 F (37.2 C), temperature source Oral, resp. rate 16, height 5\' 8"  (1.727 m), weight 171 lb 8 oz (77.792 kg), SpO2 91 %. Gen: Alert, well appearing.  Patient is oriented to person, place, time, and situation. AFFECT: pleasant, lucid thought and speech. CV: RRR, no m/r/g.   LUNGS: CTA bilat, nonlabored resps, good aeration in all lung fields. EXT: no clubbing, cyanosis, or edema.    LABS:  Lab Results  Component Value Date   TSH 1.10 06/11/2015   Lab Results  Component Value Date   WBC 6.8 06/11/2015   HGB 12.1 06/11/2015   HCT 35.4* 06/11/2015   MCV 88.4 06/11/2015   PLT 279.0 06/11/2015   Lab Results  Component Value Date   CREATININE 0.94 06/11/2015   BUN 12 06/11/2015   NA 139 06/11/2015   K 4.6 06/11/2015   CL 101 06/11/2015   CO2 32 06/11/2015   Lab Results  Component Value Date   ALT 11 06/11/2015   AST 14 06/11/2015    ALKPHOS 51 06/11/2015   BILITOT 0.6 06/11/2015   Lab Results  Component Value Date   CHOL 235* 06/11/2015   Lab Results  Component Value Date   HDL 61.40 06/11/2015   Lab Results  Component Value Date   LDLCALC 151* 06/11/2015   Lab Results  Component Value Date   TRIG 109.0 06/11/2015   Lab Results  Component Value Date   CHOLHDL 4 06/11/2015    IMPRESSION AND PLAN:  1) COPD; improved on advair 250/50 the last few months as per pulmonologist's mgmt. She will keep upcoming f/u appt with him.  ProAir RF'd today.  2) HTN: Home monitoring normal.  Encouraged pt to monitor this a bit more frequently and call if persistently elevated.  No changes today.  3) Fibromyalgia: she handles this pretty well.  She'll continue current meds and f/u with her rheumatologist.  4) Bipolar d/o: The current medical regimen is effective;  continue present plan and medications. RF'd lamictal and venlafaxine today.  5) Osteoporosis: as per 06/2015 DEXA.  She is intolerant of fosamax.  Will start the prolia approval/insurance process and get back with her about this.  An After Visit Summary was printed and given to the patient.  FOLLOW UP: Return in about 9 months (around 06/16/2016) for annual CPE (fasting).  Signed:  Crissie Sickles, MD           09/17/2015

## 2015-09-18 ENCOUNTER — Encounter: Payer: Self-pay | Admitting: Family Medicine

## 2015-09-18 ENCOUNTER — Ambulatory Visit: Payer: Medicare Other | Admitting: Pulmonary Disease

## 2015-09-18 NOTE — Telephone Encounter (Signed)
Prolia verification submitted.  Awaiting response.

## 2015-09-21 NOTE — Telephone Encounter (Signed)
Noted  

## 2015-09-21 NOTE — Telephone Encounter (Signed)
I got insurance verification today, looks like patient's out of pocket cost will be around $365 for every injection.  She will be responsible for copay of $5.00 up front.  I LMOM for pt to CB to discuss.

## 2015-09-23 NOTE — Telephone Encounter (Signed)
Prices estimation is actually $275 / injection based on discussion with Prolia representative.

## 2015-09-23 NOTE — Telephone Encounter (Signed)
Left another message for patient to CB to discuss.

## 2015-09-28 NOTE — Telephone Encounter (Signed)
Patient is aware of the Estimated cost of injection.  She will CB to let us know if she wants to proceed.

## 2015-09-30 ENCOUNTER — Ambulatory Visit (INDEPENDENT_AMBULATORY_CARE_PROVIDER_SITE_OTHER): Payer: Medicare Other | Admitting: Family Medicine

## 2015-09-30 ENCOUNTER — Encounter: Payer: Self-pay | Admitting: Family Medicine

## 2015-09-30 VITALS — BP 128/79 | HR 74 | Temp 98.3°F | Resp 16 | Ht 68.0 in | Wt 172.0 lb

## 2015-09-30 DIAGNOSIS — M7742 Metatarsalgia, left foot: Secondary | ICD-10-CM | POA: Diagnosis not present

## 2015-09-30 DIAGNOSIS — M810 Age-related osteoporosis without current pathological fracture: Secondary | ICD-10-CM

## 2015-09-30 MED ORDER — DENOSUMAB 60 MG/ML ~~LOC~~ SOLN
60.0000 mg | Freq: Once | SUBCUTANEOUS | Status: AC
  Administered 2015-09-30: 60 mg via SUBCUTANEOUS

## 2015-09-30 NOTE — Telephone Encounter (Signed)
Pt came in today for office visit and told [name] that she wanted to go ahead with the Prolia injections. Please advise. Thanks.

## 2015-09-30 NOTE — Progress Notes (Signed)
Pre visit review using our clinic review tool, if applicable. No additional management support is needed unless otherwise documented below in the visit note. 

## 2015-09-30 NOTE — Progress Notes (Signed)
OFFICE VISIT  09/30/2015   CC:  Chief Complaint  Patient presents with  . Foot Pain    x 1 month  . electric shock in her heart    last weekend   HPI:    Patient is a 65 y.o. Caucasian female who presents for left foot pain for the last month or so. Hurts in metatarsal head region diffusely.  Has tried exercises with ice, metatarsal inserts in shoes.  Nothing has helped. No injury prior to onset of her pain.  No recent change in shoeware.  No symptoms in right foot.  Of note, patient has osteoporosis and is intolerant of bisphosphonates and has been looking into getting Prolia.  She tells me today that she does want Prolia.  Past Medical History  Diagnosis Date  . Hypertension   . Bipolar 1 disorder [facility])     sees a psychiatrist  . Anxiety   . Hyperlipidemia     no meds in > a decade per pt  . COPD (chronic obstructive pulmonary disease) ([place])   . Fibromyalgia   . Alcoholism in remission [facility])     states last alcohol 11 yrs ago  . Vitamin D deficiency 05/2011  . Osteoporosis 05/2010    2012 'penia; 06/2015 'porosis:  repeat DEXA 2 yrs  . Memory loss     Abnl MRI brain and CT brain c/w chronic microvascular ischemia.  Repeat MRI brain 02/2014 stable (Dr. Mayra Reel with no plan to f/u with neuro as of 04/2014)  . History of Helicobacter pylori infection 04/2008    +gastric biopsy (gastritis but no metaplasia, dysplasia, or malignancy identified)  . Environmental allergies   . Shortness of breath   . GERD (gastroesophageal reflux disease)   . Left ureteral calculus 08/2013    Perc nephr--cystoscopy w/ureteroscopy + laser for stone removal.  Residual asymptomatic left renal nephrolithiasis <80mm present post-procedure.  Marland Kitchen History of adenomatous polyp of colon 04/2008; 08/2014    No high grade dysplasia: recall 5 yrs ([name], [name])  . History of basal cell carcinoma excision     NOSE  . History of TIA (transient ischemic attack)     secondary to HELLP syndrome 1994  post c/s--  residual memory loss  . TMJ (temporomandibular joint disorder)     USES  MOUTH GUARD  . Chronic renal insufficiency, stage II (mild)   . Cervical spondylosis without myelopathy 12/19/2013    MRI 02/2014: multilevel DDD/spondylosis, not much change compared to prior MRI.  Pt not in favor of invasive therapy for her neck as of 04/2014.  Marland Kitchen Sprain of neck 12/19/2013  . Internal hemorrhoids   . Hiatal hernia     Past Surgical History  Procedure Laterality Date  . Cervical spine surgery  08-27-1999      C6 -- T1  LAMINECTOMY/  DISKECTOMY  . Total knee arthroplasty Right 2003  . Cesarean section  1994  . Colonoscopy w/ polypectomy  04/2008;08/2014    2016 tubular adenoma x 1; +diverticulosis and int/ext hemorrhoids.  Recall 5 yrs  . Esophagogastroduodenoscopy  2010; 08/2014    +Candidal esophagitis; Mild chronic gastritis w/intestinal metaplasia--NEG H pylori, neg for eosinophilic esoph  . Nephrolithotomy Left 08/05/2013    Procedure: NEPHROLITHOTOMY PERCUTANEOUS;  Surgeon: [name], MD;  Location: [facility];  Service: Urology;  Laterality: Left;  . Cervical fusion  2002    anterior C5 -- C7  . Anterior cervical decomp/discectomy fusion  06-30-2009    C3 -- C5  AND EXPLORATION OF FUSION C5-7 W/  PLATE REMOVAL  . Tonsillectomy and adenoidectomy  1975  . Negative sleep study  04-01-2007  . Cystoscopy with ureteroscopy and stent placement Left 08/28/2013    Procedure: CYSTOSCOPY, RGP,  WITH URETEROSCOPY AND STENT REMOVAL;  Surgeon: [name], MD;  Location: [facility];  Service: Urology;  Laterality: Left;  . Holmium laser application Left [id number]    Procedure: HOLMIUM LASER APPLICATION;  Surgeon: [name], MD;  Location: [facility];  Service: Urology;  Laterality: Left;  . Ultrasound exam,pelvic complete ([facility] h[facility])  06/25/15    NORMAL (done by GYN)    Outpatient Prescriptions Prior to Visit  Medication Sig Dispense Refill  . albuterol  (PROAIR HFA) 108 (90 Base) MCG/ACT inhaler INHALE 2 PUFFS INTO THE LUNGS EVERY 4 (FOUR) HOURS AS NEEDED FOR WHEEZING OR SHORTNESS OF BREATH. 8.5 each 1  . ALPRAZolam (XANAX) 1 MG tablet Take 1 mg by mouth 3 (three) times daily as needed.     . carvedilol (COREG) 12.5 MG tablet Take 1 tablet (12.5 mg total) by mouth 2 (two) times daily with a meal. 60 tablet 11  . cetirizine (ZYRTEC) 10 MG tablet Take 10 mg by mouth every morning.     . cyclobenzaprine (FLEXERIL) 10 MG tablet Take 10 mg by mouth 2 (two) times daily as needed.     . fluticasone (FLONASE) 50 MCG/ACT nasal spray Place 2 sprays into both nostrils daily. 16 g 12  . Fluticasone-Salmeterol (ADVAIR) 250-50 MCG/DOSE AEPB Inhale 1 puff into the lungs 2 (two) times daily. 60 each 5  . lamoTRIgine (LAMICTAL) 100 MG tablet Take 3 tablets (300 mg total) by mouth at bedtime. 90 tablet 11  . lisinopril (PRINIVIL,ZESTRIL) 30 MG tablet Take 1 tablet (30 mg total) by mouth daily. 30 tablet 12  . montelukast (SINGULAIR) 10 MG tablet Take 1 tablet (10 mg total) by mouth every evening. 30 tablet 12  . Oxycodone HCl 10 MG TABS Take 1 tablet by mouth 3 (three) times daily.    . polyethylene glycol powder (GLYCOLAX/MIRALAX) powder Take 17 g by mouth daily. 3162 g 12  . ranitidine (ZANTAC) 150 MG capsule Take 150 mg by mouth 2 (two) times daily as needed.     . venlafaxine XR (EFFEXOR-XR) 150 MG 24 hr capsule Take 1 capsule (150 mg total) by mouth daily with breakfast. 30 capsule 11  . zolpidem (AMBIEN) 10 MG tablet Take 10 mg by mouth at bedtime.     No facility-administered medications prior to visit.    Allergies  Allergen Reactions  . Ceftin [Cefuroxime Axetil] Anaphylaxis    Swelling of eyes and tongue  . Fosamax [Alendronate Sodium] Diarrhea  . Morphine And Related Other (See Comments)    Hallucinations/ disoriented  . Prednisone Other (See Comments)    Hyperactivity    ROS As per HPI  PE: Blood pressure 128/79, pulse 74, temperature  98.3 F (36.8 C), temperature source Oral, resp. rate 16, height 5\' 8"  (1.727 m), weight 172 lb (78.019 kg), SpO2 92 %. Gen: Alert, well appearing.  Patient is oriented to person, place, time, and situation. Left foot: mild hallux valgus deformity with mild TTP over this joint. Mild TTP over distal aspect of all metatarsal bones in left foot, with additional tenderness in web space of metatarsal heads.  Point of most tenderness is over the head of the 3rd metatarsal.   Remainder of foot is nontender. She has mild flat  foot deformity bilat, but arches come up nicely when she raises up on her toes.  LABS:    Chemistry      Component Value Date/Time   NA 139 06/11/2015 1221   K 4.6 06/11/2015 1221   CL 101 06/11/2015 1221   CO2 32 06/11/2015 1221   BUN 12 06/11/2015 1221   CREATININE 0.94 06/11/2015 1221      Component Value Date/Time   CALCIUM 9.4 06/11/2015 1221   ALKPHOS 51 06/11/2015 1221   AST 14 06/11/2015 1221   ALT 11 06/11/2015 1221   BILITOT 0.6 06/11/2015 1221      IMPRESSION AND PLAN:  1) Left metatarsalgia. X-ray to r/o stress fracture. She has failed conservative measures. If x-ray is negative for fracture, I will refer to podiatry. Pt prefers [facility] in [place] and has a specific provider in mind there that she can't recall the name of right now.  She'll look this info up and let us know when we call her with x-ray results.  2) Osteoporosis: patient decided to go with Prolia as her treatment for this and the nurse gave her an injection of this today in the office.  An After Visit Summary was printed and given to the patient.  FOLLOW UP: Return if symptoms worsen or fail to improve.  Signed:  Crissie Sickles, MD           09/30/2015

## 2015-10-01 NOTE — Telephone Encounter (Signed)
Injection has been documented on [employer].

## 2015-10-02 ENCOUNTER — Ambulatory Visit (INDEPENDENT_AMBULATORY_CARE_PROVIDER_SITE_OTHER)
Admission: RE | Admit: 2015-10-02 | Discharge: 2015-10-02 | Disposition: A | Discharge status: Home / Self Care | Payer: Medicare Other | Source: Ambulatory Visit | Attending: Family Medicine | Admitting: Family Medicine

## 2015-10-02 DIAGNOSIS — M19072 Primary osteoarthritis, left ankle and foot: Secondary | ICD-10-CM | POA: Diagnosis not present

## 2015-10-02 DIAGNOSIS — M150 Primary generalized (osteo)arthritis: Secondary | ICD-10-CM | POA: Diagnosis not present

## 2015-10-02 DIAGNOSIS — M797 Fibromyalgia: Secondary | ICD-10-CM | POA: Diagnosis not present

## 2015-10-02 DIAGNOSIS — M7742 Metatarsalgia, left foot: Secondary | ICD-10-CM

## 2015-10-02 DIAGNOSIS — Z79891 Long term (current) use of opiate analgesic: Secondary | ICD-10-CM | POA: Diagnosis not present

## 2015-10-05 ENCOUNTER — Other Ambulatory Visit: Payer: Self-pay | Admitting: Family Medicine

## 2015-10-05 DIAGNOSIS — M7742 Metatarsalgia, left foot: Secondary | ICD-10-CM

## 2015-10-12 ENCOUNTER — Encounter: Payer: Self-pay | Admitting: Pulmonary Disease

## 2015-10-12 ENCOUNTER — Ambulatory Visit (INDEPENDENT_AMBULATORY_CARE_PROVIDER_SITE_OTHER): Payer: Medicare Other | Admitting: Pulmonary Disease

## 2015-10-12 VITALS — BP 130/68 | HR 75 | Ht 67.0 in | Wt 175.2 lb

## 2015-10-12 DIAGNOSIS — J439 Emphysema, unspecified: Secondary | ICD-10-CM

## 2015-10-12 MED ORDER — ALBUTEROL SULFATE 108 (90 BASE) MCG/ACT IN AEPB: 0.6500 g | INHALATION_SPRAY | RESPIRATORY_TRACT | Status: DC | PRN

## 2015-10-12 MED ORDER — UMECLIDINIUM BROMIDE 62.5 MCG/INH IN AEPB
1.0000 | INHALATION_SPRAY | Freq: Every day | RESPIRATORY_TRACT | Status: AC
Start: 2015-10-12 — End: 2015-10-13

## 2015-10-12 MED ORDER — UMECLIDINIUM BROMIDE 62.5 MCG/INH IN AEPB: 1.0000 | INHALATION_SPRAY | Freq: Every day | RESPIRATORY_TRACT | Status: AC

## 2015-10-12 NOTE — Progress Notes (Signed)
Subjective:    Patient ID: Erin Lopez, female    DOB: 11/22/1950, 65 y.o.   MRN: [id number]  PROBLEM LIST Moderate COPD  HPI [name] is a 65 year old with smoking history. She has been referred for management of her COPD. She has dyspnea on exertion at baseline but has noticed worsening symptoms over the past 6-8 weeks. This is accompanied occasionally wheezing. She does not have symptoms at rest. She does not have any cough, sputum production, fevers, chills. She has not seen any improvement on spiriva. She is currently on advair. The albuterol rescue inhaler gives significant relief to symptoms She had a negative sleep study a few years ago.  DATA: CT abd 08/06/13. Images reviewed Linear bilateral lower lobe atelectasis or scarring noted. Areas of mild bronchiectasis are noted.   Barium swallow 04/18/08 Minimal hiatal hernia. GE reflux.  PFTs 06/16/15 FVC 3.01 (85%) FEV1 1.65 (60%) F/F 55 Moderate obstructive defect.  Social History: 30-pack-year smoking history. Quit in 1995. Alcohol free since 2004. She is to work at [employer] [place]. No exposures at work or at home.  Family history: Mother-alcohol abuse, arthritis, diabetes, hyperlipidemia, hypertension, Parkinson's Father-diabetes, heart disease, hyperlipidemia, hypertension, prostate cancer  Past Medical History  Diagnosis Date  . Hypertension   . Bipolar 1 disorder [facility])     sees a psychiatrist  . Anxiety   . Hyperlipidemia     no meds in > a decade per pt  . COPD (chronic obstructive pulmonary disease) ([place])   . Fibromyalgia   . Alcoholism in remission [facility])     states last alcohol 11 yrs ago  . Vitamin D deficiency 05/2011  . Osteoporosis 05/2010    2012 'penia; 06/2015 'porosis:  repeat DEXA 2 yrs  . Memory loss     Abnl MRI brain and CT brain c/w chronic microvascular ischemia.  Repeat MRI brain 02/2014 stable (Dr. Mayra Reel with no plan to f/u with neuro as of 04/2014)  . History of Helicobacter  pylori infection 04/2008    +gastric biopsy (gastritis but no metaplasia, dysplasia, or malignancy identified)  . Environmental allergies   . Shortness of breath   . GERD (gastroesophageal reflux disease)   . Left ureteral calculus 08/2013    Perc nephr--cystoscopy w/ureteroscopy + laser for stone removal.  Residual asymptomatic left renal nephrolithiasis <25mm present post-procedure.  Marland Kitchen History of adenomatous polyp of colon 04/2008; 08/2014    No high grade dysplasia: recall 5 yrs ([name], [name])  . History of basal cell carcinoma excision     NOSE  . 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 TMJ (temporomandibular joint disorder)     USES  MOUTH GUARD  . Chronic renal insufficiency, stage II (mild)   . Cervical spondylosis without myelopathy 12/19/2013    MRI 02/2014: multilevel DDD/spondylosis, not much change compared to prior MRI.  Pt not in favor of invasive therapy for her neck as of 04/2014.  Marland Kitchen Sprain of neck 12/19/2013  . Internal hemorrhoids   . Hiatal hernia     Current outpatient prescriptions:  .  albuterol (PROAIR HFA) 108 (90 Base) MCG/ACT inhaler, INHALE 2 PUFFS INTO THE LUNGS EVERY 4 (FOUR) HOURS AS NEEDED FOR WHEEZING OR SHORTNESS OF BREATH., Disp: 8.5 each, Rfl: 1 .  ALPRAZolam (XANAX) 1 MG tablet, Take 1 mg by mouth 3 (three) times daily as needed. , Disp: , Rfl:  .  carvedilol (COREG) 12.5 MG tablet, Take 1 tablet (12.5 mg total) by mouth 2 (two) times daily with a meal., Disp: 60 tablet, Rfl: 11 .  cetirizine (ZYRTEC) 10 MG tablet, Take 10 mg by mouth every morning. , Disp: , Rfl:  .  cyclobenzaprine (FLEXERIL) 10 MG tablet, Take 10 mg by mouth 2 (two) times daily as needed. , Disp: , Rfl:  .  fluticasone (FLONASE) 50 MCG/ACT nasal spray, Place 2 sprays into both nostrils daily., Disp: 16 g, Rfl: 12 .  Fluticasone-Salmeterol (ADVAIR) 250-50 MCG/DOSE AEPB, Inhale 1 puff into the lungs 2 (two)  times daily., Disp: 60 each, Rfl: 5 .  lamoTRIgine (LAMICTAL) 100 MG tablet, Take 3 tablets (300 mg total) by mouth at bedtime., Disp: 90 tablet, Rfl: 11 .  lisinopril (PRINIVIL,ZESTRIL) 30 MG tablet, Take 1 tablet (30 mg total) by mouth daily., Disp: 30 tablet, Rfl: 12 .  montelukast (SINGULAIR) 10 MG tablet, Take 1 tablet (10 mg total) by mouth every evening., Disp: 30 tablet, Rfl: 12 .  Oxycodone HCl 10 MG TABS, Take 1 tablet by mouth 3 (three) times daily., Disp: , Rfl:  .  polyethylene glycol powder (GLYCOLAX/MIRALAX) powder, Take 17 g by mouth daily., Disp: 3162 g, Rfl: 12 .  ranitidine (ZANTAC) 150 MG capsule, Take 150 mg by mouth 2 (two) times daily as needed. , Disp: , Rfl:  .  venlafaxine XR (EFFEXOR-XR) 150 MG 24 hr capsule, Take 1 capsule (150 mg total) by mouth daily with breakfast., Disp: 30 capsule, Rfl: 11 .  zolpidem (AMBIEN) 10 MG tablet, Take 10 mg by mouth at bedtime., Disp: , Rfl:     Review of Systems Dyspnea on exertion, wheezing. No cough, sputum production, hemoptysis. No fevers, chills, loss of weight, appetite, malaise, fatigue. No chest pain, palpitation. No nausea, vomiting, diarrhea, consultation. All other review of systems are negative.    Objective:   Physical Exam  Blood pressure 130/68, pulse 75, height 5\' 7"  (1.702 m), weight 175 lb 3.2 oz (79.47 kg), SpO2 95 %. Gen: No apparent distress Neuro: No gross focal deficits. Neck: No JVD, lymphadenopathy, thyromegaly. RS: Clear, No wheeze or crackles CVS: S1-S2 heard, no murmurs rubs gallops. Abdomen: Soft, positive bowel sounds. Extremities: No edema.       ``    Assessment & Plan:  COPD Maintained on Advair. Still had worsening DOE and spiriva does not help. She has a significant improvement with albuterol rescue inhaler indicating that she will benefit from optimization of her inhalers. I will switch spiriva to incruse.    CT scan abdomen from 2015 reviewed. It shows bibasilar atelectasis, scarring,  bronchiectasis. She does have dysphagia, H pylori gastritis, GERD, hiatal hernia and may have chronic aspiration. We'll continue to monitor this. She may need a referral back to GI.   Plan: - Continue advair 250/50 - Change spiriva to incruse  Return in 6 months.   Marshell Garfinkel MD [facility] Pager [phone] If no answer or after 3pm call: [phone] 10/12/2015, 3:55 PM   Addendum: Spirometry 06/16/15 FVC 3.01 (85%) FEV1 1.65 (60%) F/F 55. Moderate airway obstruction.

## 2015-10-12 NOTE — Patient Instructions (Signed)
Continue the advair and albuterol rescue inhaler. We will switch spiriva to incruse elipta.  Return in 6 months

## 2015-10-21 ENCOUNTER — Ambulatory Visit: Payer: Self-pay

## 2015-10-21 ENCOUNTER — Ambulatory Visit (INDEPENDENT_AMBULATORY_CARE_PROVIDER_SITE_OTHER): Payer: Medicare Other | Admitting: Podiatry

## 2015-10-21 DIAGNOSIS — M79672 Pain in left foot: Secondary | ICD-10-CM

## 2015-10-21 DIAGNOSIS — M779 Enthesopathy, unspecified: Secondary | ICD-10-CM | POA: Diagnosis not present

## 2015-10-21 DIAGNOSIS — M21619 Bunion of unspecified foot: Secondary | ICD-10-CM

## 2015-10-21 MED ORDER — TRIAMCINOLONE ACETONIDE 10 MG/ML IJ SUSP
10.0000 mg | Freq: Once | INTRAMUSCULAR | Status: AC
  Administered 2015-10-21: 10 mg

## 2015-10-21 NOTE — Progress Notes (Signed)
   Subjective:    Patient ID: Erin Lopez, female    DOB: 04-13-1950, 65 y.o.   MRN: [id number]  HPI Pain across the top of the left foot.     Review of Systems  Constitutional: Positive for fatigue.       Objective:   Physical Exam        Assessment & Plan:

## 2015-10-21 NOTE — Progress Notes (Signed)
Subjective:     Patient ID: Erin Lopez, female   DOB: January 08, 1951, 65 y.o.   MRN: [id number]  HPI patient presents with a lot of pain in the forefoot left and states that it's been hurting for about a month and gradually worsening and making it hard to walk   Review of Systems  All other systems reviewed and are negative.      Objective:   Physical Exam  Constitutional: She is oriented to person, place, and time.  Cardiovascular: Intact distal pulses.   Musculoskeletal: Normal range of motion.  Neurological: She is oriented to person, place, and time.  Skin: Skin is warm.  Nursing note and vitals reviewed.  neurovascular status intact muscle strength adequate range of motion within normal limits with patient found to have quite a bit of discomfort in the midtarsal joint left with inflammation fluid buildup and mild discomfort plantar heel right at the insertional point tendon into the calcaneus. Patient's found have good digital perfusion and is well oriented 3 with also structural bunion deformity noted bilateral     Assessment:     Inflammatory tendinitis of the dorsum of the left foot with fluid buildup and structural bunion deformity bilateral with depression of the arch noted    Plan:     H&P and x-rays reviewed. Injected the dorsal tendon complex left 3 mg Kenalog 5 mg Xylocaine and advised on physical therapy anti-inflammatories and heat ice therapy. Discussed possible orthotics long-term and bunion correction which we will defer at this time dispensed fascial brace to lift the left arch and take pressure off the arch   X-ray report indicated large structural bunion deformity left with no indications of stress fracture or active arthritis

## 2015-10-30 DIAGNOSIS — M797 Fibromyalgia: Secondary | ICD-10-CM | POA: Diagnosis not present

## 2015-10-30 DIAGNOSIS — M150 Primary generalized (osteo)arthritis: Secondary | ICD-10-CM | POA: Diagnosis not present

## 2015-10-30 DIAGNOSIS — Z79891 Long term (current) use of opiate analgesic: Secondary | ICD-10-CM | POA: Diagnosis not present

## 2015-11-13 ENCOUNTER — Ambulatory Visit: Payer: Medicare Other | Admitting: Podiatry

## 2015-11-23 DIAGNOSIS — Z1283 Encounter for screening for malignant neoplasm of skin: Secondary | ICD-10-CM | POA: Diagnosis not present

## 2015-11-23 DIAGNOSIS — L718 Other rosacea: Secondary | ICD-10-CM | POA: Diagnosis not present

## 2015-11-23 DIAGNOSIS — L821 Other seborrheic keratosis: Secondary | ICD-10-CM | POA: Diagnosis not present

## 2015-11-23 DIAGNOSIS — L820 Inflamed seborrheic keratosis: Secondary | ICD-10-CM | POA: Diagnosis not present

## 2015-11-30 DIAGNOSIS — Z79891 Long term (current) use of opiate analgesic: Secondary | ICD-10-CM | POA: Diagnosis not present

## 2015-11-30 DIAGNOSIS — M797 Fibromyalgia: Secondary | ICD-10-CM | POA: Diagnosis not present

## 2015-11-30 DIAGNOSIS — M150 Primary generalized (osteo)arthritis: Secondary | ICD-10-CM | POA: Diagnosis not present

## 2015-12-05 ENCOUNTER — Ambulatory Visit: Payer: Medicare Other | Admitting: Family Medicine

## 2015-12-08 ENCOUNTER — Other Ambulatory Visit: Payer: Self-pay | Admitting: Pulmonary Disease

## 2015-12-25 ENCOUNTER — Encounter: Payer: Self-pay | Admitting: Family Medicine

## 2015-12-25 ENCOUNTER — Ambulatory Visit (INDEPENDENT_AMBULATORY_CARE_PROVIDER_SITE_OTHER): Payer: Medicare Other | Admitting: Family Medicine

## 2015-12-25 VITALS — BP 144/86 | HR 69 | Temp 97.7°F | Resp 18 | Wt 174.0 lb

## 2015-12-25 DIAGNOSIS — R51 Headache: Secondary | ICD-10-CM

## 2015-12-25 DIAGNOSIS — I10 Essential (primary) hypertension: Secondary | ICD-10-CM

## 2015-12-25 DIAGNOSIS — R030 Elevated blood-pressure reading, without diagnosis of hypertension: Secondary | ICD-10-CM | POA: Diagnosis not present

## 2015-12-25 DIAGNOSIS — M542 Cervicalgia: Secondary | ICD-10-CM | POA: Diagnosis not present

## 2015-12-25 DIAGNOSIS — IMO0001 Reserved for inherently not codable concepts without codable children: Secondary | ICD-10-CM

## 2015-12-25 DIAGNOSIS — R519 Headache, unspecified: Secondary | ICD-10-CM

## 2015-12-25 MED ORDER — LISINOPRIL 40 MG PO TABS: 40.0000 mg | ORAL_TABLET | Freq: Every day | ORAL | 11 refills | Status: DC

## 2015-12-25 NOTE — Progress Notes (Signed)
Pre visit review using our clinic review tool, if applicable. No additional management support is needed unless otherwise documented below in the visit note. 

## 2015-12-25 NOTE — Progress Notes (Signed)
OFFICE VISIT  12/25/2015   CC:  Chief Complaint  Patient presents with  . Hypertension    elevated blood pressure, hands and feet red and itching couple nights ago   HPI:    Patient is a 65 y.o. Caucasian female who presents for recent elevation of BP. Couple days of systolic bp in to [id number] , HA--she lay in bed a couple days and "got through it".   It self-resolved and bp was fine until this week when systolic got to [id number] several days (most recent was last night).  Has had a bad HA in temples area.  She seems to have HA and neck pain more when she notes her bp is up.  No otc decongestants.  No NSAIDs.   No different level of stress than her usual.   When bp is "normal" for her it is 130s/60s.  Past Medical History:  Diagnosis Date  . Alcoholism in remission [facility])    states last alcohol 11 yrs ago  . Anxiety   . Bipolar 1 disorder [facility])    sees a psychiatrist  . Cervical spondylosis without myelopathy 12/19/2013   MRI 02/2014: multilevel DDD/spondylosis, not much change compared to prior MRI.  Pt not in favor of invasive therapy for her neck as of 04/2014.  Marland Kitchen Chronic renal insufficiency, stage II (mild)   . COPD (chronic obstructive pulmonary disease) ([place])   . Environmental allergies   . Fibromyalgia   . GERD (gastroesophageal reflux disease)   . Hiatal hernia   . History of adenomatous polyp of colon 04/2008; 08/2014   No high grade dysplasia: recall 5 yrs ([name], [name])  . History of basal cell carcinoma excision    NOSE  . History of Helicobacter pylori infection 04/2008   +gastric biopsy (gastritis but no metaplasia, dysplasia, or malignancy identified)  . History of TIA (transient ischemic attack)    secondary to HELLP syndrome 1994 post c/s--  residual memory loss  . Hyperlipidemia    no meds in > a decade per pt  . Hypertension   . Internal hemorrhoids   . Left ureteral calculus 08/2013   Perc nephr--cystoscopy w/ureteroscopy + laser for stone removal.   Residual asymptomatic left renal nephrolithiasis <50mm present post-procedure.  . Memory loss    Abnl MRI brain and CT brain c/w chronic microvascular ischemia.  Repeat MRI brain 02/2014 stable (Dr. Mayra Reel with no plan to f/u with neuro as of 04/2014)  . Osteoporosis 05/2010   2012 'penia; 06/2015 'porosis:  repeat DEXA 2 yrs  . Shortness of breath   . Sprain of neck 12/19/2013  . TMJ (temporomandibular joint disorder)    USES  MOUTH GUARD  . Vitamin D deficiency 05/2011    Past Surgical History:  Procedure Laterality Date  . ANTERIOR CERVICAL DECOMP/DISCECTOMY FUSION  06-30-2009   C3 -- C5  AND EXPLORATION OF FUSION C5-7 W/  PLATE REMOVAL  . CERVICAL FUSION  2002   anterior C5 -- C7  . CERVICAL SPINE SURGERY  08-27-1999     C6 -- T1  LAMINECTOMY/  DISKECTOMY  . CESAREAN SECTION  1994  . COLONOSCOPY W/ POLYPECTOMY  04/2008;08/2014   2016 tubular adenoma x 1; +diverticulosis and int/ext hemorrhoids.  Recall 5 yrs  . CYSTOSCOPY WITH URETEROSCOPY AND STENT PLACEMENT Left 08/28/2013   Procedure: CYSTOSCOPY, RGP,  WITH URETEROSCOPY AND STENT REMOVAL;  Surgeon: [name], MD;  Location: [facility];  Service: Urology;  Laterality: Left;  . ESOPHAGOGASTRODUODENOSCOPY  2010; 08/2014   +Candidal esophagitis; Mild chronic gastritis w/intestinal metaplasia--NEG H pylori, neg for eosinophilic esoph  . HOLMIUM LASER APPLICATION Left [id number]   Procedure: HOLMIUM LASER APPLICATION;  Surgeon: [name], MD;  Location: [facility];  Service: Urology;  Laterality: Left;  . NEGATIVE SLEEP STUDY  04-01-2007  . NEPHROLITHOTOMY Left 08/05/2013   Procedure: NEPHROLITHOTOMY PERCUTANEOUS;  Surgeon: [name], MD;  Location: [facility];  Service: Urology;  Laterality: Left;  . TONSILLECTOMY AND ADENOIDECTOMY  1975  . TOTAL KNEE ARTHROPLASTY Right 2003  . ULTRASOUND EXAM,PELVIC COMPLETE ([facility] HX)  06/25/15   NORMAL (done by GYN)    Outpatient Medications Prior to  Visit  Medication Sig Dispense Refill  . ADVAIR DISKUS 250-50 MCG/DOSE AEPB INHALE 1 PUFF INTO THE LUNGS 2 (TWO) TIMES DAILY. 60 each 5  . albuterol (PROAIR HFA) 108 (90 Base) MCG/ACT inhaler INHALE 2 PUFFS INTO THE LUNGS EVERY 4 (FOUR) HOURS AS NEEDED FOR WHEEZING OR SHORTNESS OF BREATH. 8.5 each 1  . ALPRAZolam (XANAX) 1 MG tablet Take 1 mg by mouth 3 (three) times daily as needed.     . carvedilol (COREG) 12.5 MG tablet Take 1 tablet (12.5 mg total) by mouth 2 (two) times daily with a meal. 60 tablet 11  . cetirizine (ZYRTEC) 10 MG tablet Take 10 mg by mouth every morning.     . cyclobenzaprine (FLEXERIL) 10 MG tablet Take 10 mg by mouth 2 (two) times daily as needed.     . fluticasone (FLONASE) 50 MCG/ACT nasal spray Place 2 sprays into both nostrils daily. 16 g 12  . lamoTRIgine (LAMICTAL) 100 MG tablet Take 3 tablets (300 mg total) by mouth at bedtime. 90 tablet 11  . montelukast (SINGULAIR) 10 MG tablet Take 1 tablet (10 mg total) by mouth every evening. 30 tablet 12  . Oxycodone HCl 10 MG TABS Take 1 tablet by mouth 3 (three) times daily.    . polyethylene glycol powder (GLYCOLAX/MIRALAX) powder Take 17 g by mouth daily. 3162 g 12  . ranitidine (ZANTAC) 150 MG capsule Take 150 mg by mouth 2 (two) times daily as needed.     . venlafaxine XR (EFFEXOR-XR) 150 MG 24 hr capsule Take 1 capsule (150 mg total) by mouth daily with breakfast. 30 capsule 11  . zolpidem (AMBIEN) 10 MG tablet Take 10 mg by mouth at bedtime.    Marland Kitchen lisinopril (PRINIVIL,ZESTRIL) 30 MG tablet Take 1 tablet (30 mg total) by mouth daily. 30 tablet 12  . Albuterol Sulfate (PROAIR RESPICLICK) [id number] (90 Base) MCG/ACT AEPB Inhale 0.65 g into the lungs as needed. 1 each 0   No facility-administered medications prior to visit.     Allergies  Allergen Reactions  . Ceftin [Cefuroxime Axetil] Anaphylaxis    Swelling of eyes and tongue  . Fosamax [Alendronate Sodium] Diarrhea  . Morphine And Related Other (See Comments)     Hallucinations/ disoriented  . Prednisone Other (See Comments)    Hyperactivity    ROS As per HPI  PE: Blood pressure (!) 144/86, pulse 69, temperature 97.7 F (36.5 C), temperature source Oral, resp. rate 18, weight 174 lb (78.9 kg), SpO2 94 %. Gen: Alert, well appearing.  Patient is oriented to person, place, time, and situation. AFFECT: pleasant, lucid thought and speech. [id number]: no injection, icteris, swelling, or exudate.  EOMI, PERRLA. Mouth: lips without lesion/swelling.  Oral mucosa pink and moist. Oropharynx without erythema, exudate, or swelling.  Neck - No masses or thyromegaly.  ROM diffusely limited due to stiffness and pain in back of neck/occipital region.  Mild diffuse soft tissue TTP in C spine and upper back/trapezius region. CV: RRR, no m/r/g.   LUNGS: CTA bilat, nonlabored resps, good aeration in all lung fields. EXT: no clubbing, cyanosis, or edema.  Neuro: CN 2-12 intact bilaterally, strength 5/5 in proximal and distal upper extremities and lower extremities bilaterally.  No tremor.    No ataxia.    No pronator drift.   LABS:    Chemistry      Component Value Date/Time   NA 139 06/11/2015 1221   K 4.6 06/11/2015 1221   CL 101 06/11/2015 1221   CO2 32 06/11/2015 1221   BUN 12 06/11/2015 1221   CREATININE 0.94 06/11/2015 1221      Component Value Date/Time   CALCIUM 9.4 06/11/2015 1221   ALKPHOS 51 06/11/2015 1221   AST 14 06/11/2015 1221   ALT 11 06/11/2015 1221   BILITOT 0.6 06/11/2015 1221     Lab Results  Component Value Date   TSH 1.10 06/11/2015   IMPRESSION AND PLAN:  1) Episodic elevation of bp.  She has headaches but it is hard to tell if her elevated bp comes first or if her bp goes up in response to cervicalgia and occipital HAs.   Will increase lisinopril to 40mg  qd.  If continues to have only episodic elevations then will add prn clonidine.  2) Cervicalgia with occipital (then generalized) HAs: part of her fibromyalgia syndrome. PT  referral made today.  An After Visit Summary was printed and given to the patient.  Spent 25 min with pt today, with >50% of this time spent in counseling and care coordination regarding the above problems.  FOLLOW UP: Return in about 6 weeks (around 02/05/2016) for f/u HTN and cervicalgia/occip HA's.  Signed:  Crissie Sickles, MD           12/25/2015

## 2015-12-28 DIAGNOSIS — Z791 Long term (current) use of non-steroidal anti-inflammatories (NSAID): Secondary | ICD-10-CM | POA: Diagnosis not present

## 2015-12-28 DIAGNOSIS — M150 Primary generalized (osteo)arthritis: Secondary | ICD-10-CM | POA: Diagnosis not present

## 2015-12-28 DIAGNOSIS — M797 Fibromyalgia: Secondary | ICD-10-CM | POA: Diagnosis not present

## 2015-12-28 DIAGNOSIS — Z79891 Long term (current) use of opiate analgesic: Secondary | ICD-10-CM | POA: Diagnosis not present

## 2016-01-22 ENCOUNTER — Ambulatory Visit: Payer: Medicare Other

## 2016-01-29 DIAGNOSIS — Z79891 Long term (current) use of opiate analgesic: Secondary | ICD-10-CM | POA: Diagnosis not present

## 2016-01-29 DIAGNOSIS — M150 Primary generalized (osteo)arthritis: Secondary | ICD-10-CM | POA: Diagnosis not present

## 2016-01-29 DIAGNOSIS — Z791 Long term (current) use of non-steroidal anti-inflammatories (NSAID): Secondary | ICD-10-CM | POA: Diagnosis not present

## 2016-01-29 DIAGNOSIS — M797 Fibromyalgia: Secondary | ICD-10-CM | POA: Diagnosis not present

## 2016-01-29 NOTE — Progress Notes (Signed)
Subjective:   Erin Lopez is a 65 y.o. female who presents for an Initial Medicare Annual Wellness Visit.  The Patient was informed that the wellness visit is to identify future health risk and educate and initiate measures that can reduce risk for increased disease through the lifespan.   Describes health as fair, good or great? "fair"  Review of Systems    No ROS.  Medicare Wellness Visit.  Cardiac Risk Factors include: dyslipidemia;hypertension;family history of premature cardiovascular disease   Sleep patterns: Normally sleeps 8 hours (takes Ambien nightly), up once to void.   Home Safety/Smoke Alarms: Smoke detectors in place.  Living environment; residence and Firearm Safety: Daughter lives with patient in single story home. Firearms locked away. Feels safe at home.   Seat Belt Safety/Bike Helmet: Wears seatbelt.    Screening Studies: Eye Exam-Last >1 year. Will make appt, unsure of providers name.  Dental-Last >2 years. Will make appt.Followed by Erin Lopez  Female:   Pap-06/25/2015, negative.      Mammo-06/29/15, normal. Recall 1 year.    Dexa scan-06/29/15, Osteoporosis. Recall 2 years. Taking Calcium supplement.  [facility]-colonoscopy, 08/12/2014.Polyps.Recall 5 years.       Objective:    Today's Vitals   02/01/16 1314 02/01/16 1318  BP: (!) 146/88   Pulse: 79   SpO2: 94%   Weight: 175 lb 12.8 oz (79.7 kg)   Height: 5' 7.5" (1.715 m)   PainSc:  7    Body mass index is 27.13 kg/m.   Current Medications (verified) Outpatient Encounter Prescriptions as of 02/01/2016  Medication Sig  . ADVAIR DISKUS 250-50 MCG/DOSE AEPB INHALE 1 PUFF INTO THE LUNGS 2 (TWO) TIMES DAILY.  Marland Kitchen albuterol (PROAIR HFA) 108 (90 Base) MCG/ACT inhaler INHALE 2 PUFFS INTO THE LUNGS EVERY 4 (FOUR) HOURS AS NEEDED FOR WHEEZING OR SHORTNESS OF BREATH.  Marland Kitchen ALPRAZolam (XANAX) 1 MG tablet Take 1 mg by mouth 3 (three) times daily as needed.   . B Complex-C (B-COMPLEX WITH VITAMIN C)  tablet Take 1 tablet by mouth daily.  . calcium & magnesium carbonates (MYLANTA) [id number] MG tablet Take 1 tablet by mouth daily.  . carvedilol (COREG) 12.5 MG tablet Take 1 tablet (12.5 mg total) by mouth 2 (two) times daily with a meal.  . cetirizine (ZYRTEC) 10 MG tablet Take 10 mg by mouth every morning.   . cyclobenzaprine (FLEXERIL) 10 MG tablet Take 10 mg by mouth 2 (two) times daily as needed.   . fluticasone (FLONASE) 50 MCG/ACT nasal spray Place 2 sprays into both nostrils daily.  . INCRUSE ELLIPTA 62.5 MCG/INH AEPB   . lamoTRIgine (LAMICTAL) 100 MG tablet Take 3 tablets (300 mg total) by mouth at bedtime.  Marland Kitchen lisinopril (PRINIVIL,ZESTRIL) 40 MG tablet Take 1 tablet (40 mg total) by mouth daily.  . montelukast (SINGULAIR) 10 MG tablet Take 1 tablet (10 mg total) by mouth every evening.  . Multiple Vitamin (MULTIVITAMIN) tablet Take 1 tablet by mouth daily.  . Oxycodone HCl 10 MG TABS Take 1 tablet by mouth 3 (three) times daily.  . polyethylene glycol powder (GLYCOLAX/MIRALAX) powder Take 17 g by mouth daily.  . ranitidine (ZANTAC) 150 MG capsule Take 150 mg by mouth 2 (two) times daily as needed.   . TURMERIC PO Take by mouth.  . venlafaxine XR (EFFEXOR-XR) 150 MG 24 hr capsule Take 1 capsule (150 mg total) by mouth daily with breakfast.  . zolpidem (AMBIEN) 10 MG tablet Take 10 mg by mouth at bedtime.  Marland Kitchen  Albuterol Sulfate (PROAIR RESPICLICK) [id number] (90 Base) MCG/ACT AEPB Inhale 0.65 g into the lungs as needed.   No facility-administered encounter medications on file as of 02/01/2016.     Allergies (verified) Ceftin [cefuroxime axetil]; Fosamax [alendronate sodium]; Morphine and related; and Prednisone   History: Past Medical History:  Diagnosis Date  . Alcoholism in remission [facility])    states last alcohol 11 yrs ago  . Anxiety   . Bipolar 1 disorder [facility])    sees a psychiatrist  . Cervical spondylosis without myelopathy 12/19/2013   MRI 02/2014: multilevel DDD/spondylosis, not  much change compared to prior MRI.  Pt not in favor of invasive therapy for her neck as of 04/2014.  Marland Kitchen Chronic renal insufficiency, stage II (mild)   . COPD (chronic obstructive pulmonary disease) ([place])   . Environmental allergies   . Fibromyalgia   . GERD (gastroesophageal reflux disease)   . Hiatal hernia   . History of adenomatous polyp of colon 04/2008; 08/2014   No high grade dysplasia: recall 5 yrs ([name], [name])  . History of basal cell carcinoma excision    NOSE  . History of Helicobacter pylori infection 04/2008   +gastric biopsy (gastritis but no metaplasia, dysplasia, or malignancy identified)  . History of TIA (transient ischemic attack)    secondary to HELLP syndrome 1994 post c/s--  residual memory loss  . Hyperlipidemia    no meds in > a decade per pt  . Hypertension   . Internal hemorrhoids   . Left ureteral calculus 08/2013   Perc nephr--cystoscopy w/ureteroscopy + laser for stone removal.  Residual asymptomatic left renal nephrolithiasis <81mm present post-procedure.  . Memory loss    Abnl MRI brain and CT brain c/w chronic microvascular ischemia.  Repeat MRI brain 02/2014 stable (Dr. Mayra Reel with no plan to f/u with neuro as of 04/2014)  . Osteoporosis 05/2010   2012 'penia; 06/2015 'porosis:  repeat DEXA 2 yrs  . Shortness of breath   . Sprain of neck 12/19/2013  . TMJ (temporomandibular joint disorder)    USES  MOUTH GUARD  . Vitamin D deficiency 05/2011   Past Surgical History:  Procedure Laterality Date  . ANTERIOR CERVICAL DECOMP/DISCECTOMY FUSION  06-30-2009   C3 -- C5  AND EXPLORATION OF FUSION C5-7 W/  PLATE REMOVAL  . CERVICAL FUSION  2002   anterior C5 -- C7  . CERVICAL SPINE SURGERY  08-27-1999     C6 -- T1  LAMINECTOMY/  DISKECTOMY  . CESAREAN SECTION  1994  . COLONOSCOPY W/ POLYPECTOMY  04/2008;08/2014   2016 tubular adenoma x 1; +diverticulosis and int/ext hemorrhoids.  Recall 5 yrs  . CYSTOSCOPY WITH URETEROSCOPY AND STENT PLACEMENT  Left 08/28/2013   Procedure: CYSTOSCOPY, RGP,  WITH URETEROSCOPY AND STENT REMOVAL;  Surgeon: Erin Amass, MD;  Location[facility];  Service: Urology;  Laterality: Left;  . ESOPHAGOGASTRODUODENOSCOPY  2010; 08/2014   +Candidal esophagitis; Mild chronic gastritis w/intestinal metaplasia--NEG H pylori, neg for eosinophilic esoph  . HOLMIUM LASER APPLICATION Left [id number]   Procedure: HOLMIUM LASER APPLICATION;  Surgeon: Erin Amass, MD;  Location: [facility];  Service: Urology;  Laterality: Left;  . NEGATIVE SLEEP STUDY  04-01-2007  . NEPHROLITHOTOMY Left 08/05/2013   Procedure: NEPHROLITHOTOMY PERCUTANEOUS;  Surgeon: Erin Amass, MD;  Location: [facility];  Service: Urology;  Laterality: Left;  . TONSILLECTOMY AND ADENOIDECTOMY  1975  . TOTAL KNEE ARTHROPLASTY Right 2003  . ULTRASOUND EXAM,PELVIC COMPLETE [facility])  06/25/15   NORMAL (done by GYN)   Family History  Problem Relation Age of Onset  . Alcohol abuse Mother   . Arthritis Mother   . Hypertension Mother   . Hyperlipidemia Mother   . Diabetes Mother   . Parkinsonism Mother   . Breast cancer Mother   . Prostate cancer Father   . Hypertension Father   . Hyperlipidemia Father   . Diabetes Father   . Heart disease Father   . Esophageal cancer Sister   . Colon polyps Sister   . Colon polyps Brother   . Heart disease Brother     x 2  . Irritable bowel syndrome Sister    Social History   Occupational History  . disabliltiy    Social History Main Topics  . Smoking status: Former Smoker    Packs/day: 1.00    Years: 33.00    Types: Cigarettes    Quit date: 08/22/1993  . Smokeless tobacco: Never Used  . Alcohol use No     Comment: ALCOHOLIC IN REMISSION SINCE  2004  . Drug use: No  . Sexual activity: Not on file    Tobacco Counseling Counseling given: Not Answered   Activities of Daily Living In your present state of health, do you have any difficulty performing the following  activities: 02/01/2016  Hearing? Y  Vision? N  Difficulty concentrating or making decisions? Y  Walking or climbing stairs? Y  Dressing or bathing? N  Doing errands, shopping? N  Preparing Food and eating ? N  Using the Toilet? N  In the past six months, have you accidently leaked urine? Y  Do you have problems with loss of bowel control? N  Managing your Medications? N  Managing your Finances? Y  Housekeeping or managing your Housekeeping? N  Some recent data might be hidden    Immunizations and Health Maintenance Immunization History  Administered Date(s) Administered  . Influenza Split 01/03/2015  . Influenza-Unspecified 01/17/2014, 01/03/2016  . Pneumococcal Polysaccharide-23 02/26/2008, 08/07/2014  . Td 04/05/2003  . Tdap 08/07/2014   Health Maintenance Due  Topic Date Due  . Hepatitis C Screening  [birth year]  . HIV Screening  [birth year]31/1967    Patient Care Team: Tammi Sou, MD as PCP - General (Family Medicine) Kathrynn Ducking, MD as Consulting Physician (Neurology) Erline Levine, MD as Consulting Physician (Neurosurgery) Allyn Kenner, MD as Consulting Physician (Dermatology) Reather Littler, MD as Consulting Physician (Rheumatology) Erin Lopez, DMD (Dentistry)  Indicate any recent Medical Services you may have received from other than [facility] providers in the past year (date may be approximate).     Assessment:   This is a routine wellness examination for Brieanne. Physical assessment deferred to PCP.   Hearing/Vision screen Hearing Screening Comments: Patient complains of right ear deficit, does not desire medical treatment or referral at this time.  Vision Screening Comments: 20/20 vision with corrected lenses.   Dietary issues and exercise activities discussed: Current Exercise Habits: Home exercise routine, Type of exercise: stretching;walking, Time (Minutes): 10, Frequency (Times/Week): 7, Weekly Exercise (Minutes/Week): 70, Exercise limited by:  respiratory conditions(s);cardiac condition(s);orthopedic condition(s)  Patient states it is difficult to participate in exercise routine d/t pain in back, hips and knees as well as respiratory issues. She also reports difficulty looking down to walk d/t the bifocal lenses of her glasses. Patient encouraged to perform chair exercises as tolerated and to schedule a f/u appointment with optometry.   Diet (meal preparation, eat out, water  intake, caffeinated beverages, dairy products, fruits and vegetables): Prepares meals with assistance from daughter. Skips lunch. Drinks hot tea with honey and water (4 cups).  Breakfast:cereal, coffee (1 cup/day) Lunch: typically skips lunch but snacks on peanut butter and apples Dinner: [place], salads, vegetables.   Encouraged patient not skip meals. Discussed increasing fruit and vegetable intake as well as water intake.   Goals      Patient Stated   . lose weight (pt-stated)          Patient would like to lose weight (10 lbs). Plans to increase walking and chair exercises as tolerated and decrease carb intake.       Depression Screen PHQ 2/9 Scores 02/01/2016  PHQ - 2 Score 0    Fall Risk Fall Risk  02/01/2016  Falls in the past year? Yes  Number falls in past yr: 2 or more  Injury with Fall? No  Risk Factor Category  High Fall Risk  Risk for fall due to : History of fall(s);Impaired mobility  Follow up Falls prevention discussed   Patient states she has fallen several times (could not recall exact number) d/t not looking where she walks. She trips over things on floor. Discussed being aware of her surroundings, especially on the floor. Patient denies injury with falls.   Cognitive Function: MMSE - Mini Mental State Exam 02/01/2016  Orientation to time 5  Orientation to Place 5  Registration 3  Attention/ Calculation 5  Recall 1  Language- name 2 objects 2  Language- repeat 1  Language- follow 3 step command 3  Language- read & follow  direction 1  Write a sentence 1  Copy design 1  Total score 28      Throughout conversation, patient had difficulty remembering specifics when asked. For example, providers names; date (year) of exams; and typical meal for dinner.   Screening Tests Health Maintenance  Topic Date Due  . Hepatitis C Screening  24-Jun-1950  . HIV Screening  12/31/1965  . ZOSTAVAX  12/30/2017 (Originally 12/30/2012)  . MAMMOGRAM  06/28/2016  . DEXA SCAN  06/28/2017  . PAP SMEAR  06/25/2018  . COLONOSCOPY  08/12/2019  . TETANUS/TDAP  08/06/2024  . PNEUMOCOCCAL POLYSACCHARIDE VACCINE  Completed  . INFLUENZA VACCINE  Completed   Patient states she received Flu Vaccine at [facility] in 01/2016, updated. Would like to wait until age 72 to receive Zostavax.     Plan:     Eat heart healthy diet (full of fruits, vegetables, whole grains, lean protein, water--limit salt, fat, and sugar intake) and increase physical activity as tolerated.  Continue doing brain stimulating activities (puzzles, reading, adult coloring books, staying active) to keep memory sharp.   Bring a copy of your advanced directives to your next office visit.   Make appointment for eye exam and dental exam.   During the course of the visit, Myeesha was educated and counseled about the following appropriate screening and preventive services:   Vaccines to include Pneumoccal, Influenza, Hepatitis B, Td, Zostavax, HCV  Cardiovascular disease screening  Colorectal cancer screening  Bone density screening  Diabetes screening  Glaucoma screening  Mammography/PAP  Nutrition counseling   Patient Instructions (the written plan) were given to the patient.    Gerilyn Nestle, RN   02/01/2016

## 2016-01-29 NOTE — Progress Notes (Signed)
Pre visit review using our clinic review tool, if applicable. No additional management support is needed unless otherwise documented below in the visit note. 

## 2016-02-01 ENCOUNTER — Ambulatory Visit (INDEPENDENT_AMBULATORY_CARE_PROVIDER_SITE_OTHER): Payer: Medicare Other

## 2016-02-01 VITALS — BP 146/88 | HR 79 | Ht 67.5 in | Wt 175.8 lb

## 2016-02-01 DIAGNOSIS — Z0000 Encounter for general adult medical examination without abnormal findings: Secondary | ICD-10-CM | POA: Diagnosis not present

## 2016-02-01 NOTE — Patient Instructions (Addendum)
Continue to eat heart healthy diet (full of fruits, vegetables, whole grains, lean protein, water--limit salt, fat, and sugar intake) and increase physical activity as tolerated.  Continue doing brain stimulating activities (puzzles, reading, adult coloring books, staying active) to keep memory sharp.   Bring a copy of your advanced directives to your next office visit.  Make appointment for eye exam and dental exam.    Fall Prevention in the Home  Falls can cause injuries. They can happen to people of all ages. There are many things you can do to make your home safe and to help prevent falls.  WHAT CAN I DO ON THE OUTSIDE OF MY HOME?  Regularly fix the edges of walkways and driveways and fix any cracks.  Remove anything that might make you trip as you walk through a door, such as a raised step or threshold.  Trim any bushes or trees on the path to your home.  Use bright outdoor lighting.  Clear any walking paths of anything that might make someone trip, such as rocks or tools.  Regularly check to see if handrails are loose or broken. Make sure that both sides of any steps have handrails.  Any raised decks and porches should have guardrails on the edges.  Have any leaves, snow, or ice cleared regularly.  Use sand or salt on walking paths during winter.  Clean up any spills in your garage right away. This includes oil or grease spills. WHAT CAN I DO IN THE BATHROOM?   Use night lights.  Install grab bars by the toilet and in the tub and shower. Do not use towel bars as grab bars.  Use non-skid mats or decals in the tub or shower.  If you need to sit down in the shower, use a plastic, non-slip stool.  Keep the floor dry. Clean up any water that spills on the floor as soon as it happens.  Remove soap buildup in the tub or shower regularly.  Attach bath mats securely with double-sided non-slip rug tape.  Do not have throw rugs and other things on the floor that can make  you trip. WHAT CAN I DO IN THE BEDROOM?  Use night lights.  Make sure that you have a light by your bed that is easy to reach.  Do not use any sheets or blankets that are too big for your bed. They should not hang down onto the floor.  Have a firm chair that has side arms. You can use this for support while you get dressed.  Do not have throw rugs and other things on the floor that can make you trip. WHAT CAN I DO IN THE KITCHEN?  Clean up any spills right away.  Avoid walking on wet floors.  Keep items that you use a lot in easy-to-reach places.  If you need to reach something above you, use a strong step stool that has a grab bar.  Keep electrical cords out of the way.  Do not use floor polish or wax that makes floors slippery. If you must use wax, use non-skid floor wax.  Do not have throw rugs and other things on the floor that can make you trip. WHAT CAN I DO WITH MY STAIRS?  Do not leave any items on the stairs.  Make sure that there are handrails on both sides of the stairs and use them. Fix handrails that are broken or loose. Make sure that handrails are as long as the stairways.  Check  any carpeting to make sure that it is firmly attached to the stairs. Fix any carpet that is loose or worn.  Avoid having throw rugs at the top or bottom of the stairs. If you do have throw rugs, attach them to the floor with carpet tape.  Make sure that you have a light switch at the top of the stairs and the bottom of the stairs. If you do not have them, ask someone to add them for you. WHAT ELSE CAN I DO TO HELP PREVENT FALLS?  Wear shoes that:  Do not have high heels.  Have rubber bottoms.  Are comfortable and fit you well.  Are closed at the toe. Do not wear sandals.  If you use a stepladder:  Make sure that it is fully opened. Do not climb a closed stepladder.  Make sure that both sides of the stepladder are locked into place.  Ask someone to hold it for you, if  possible.  Clearly mark and make sure that you can see:  Any grab bars or handrails.  First and last steps.  Where the edge of each step is.  Use tools that help you move around (mobility aids) if they are needed. These include:  Canes.  Walkers.  Scooters.  Crutches.  Turn on the lights when you go into a dark area. Replace any light bulbs as soon as they burn out.  Set up your furniture so you have a clear path. Avoid moving your furniture around.  If any of your floors are uneven, fix them.  If there are any pets around you, be aware of where they are.  Review your medicines with your doctor. Some medicines can make you feel dizzy. This can increase your chance of falling. Ask your doctor what other things that you can do to help prevent falls.   This information is not intended to replace advice given to you by your health care provider. Make sure you discuss any questions you have with your health care provider.   Document Released: 01/15/2009 Document Revised: 08/05/2014 Document Reviewed: 04/25/2014 Elsevier Interactive Patient Education 2016 Erin Lopez, Female Adopting a healthy lifestyle and getting preventive care can go a long way to promote health and wellness. Talk with your health care provider about what schedule of regular examinations is right for you. This is a good chance for you to check in with your provider about disease prevention and staying healthy. In between checkups, there are plenty of things you can do on your own. Experts have done a lot of research about which lifestyle changes and preventive measures are most likely to keep you healthy. Ask your health care provider for more information. WEIGHT AND DIET  Eat a healthy diet  Be sure to include plenty of vegetables, fruits, low-fat dairy products, and lean protein.  Do not eat a lot of foods high in solid fats, added sugars, or salt.  Get regular exercise. This is  one of the most important things you can do for your health.  Most adults should exercise for at least 150 minutes each week. The exercise should increase your heart rate and make you sweat (moderate-intensity exercise).  Most adults should also do strengthening exercises at least twice a week. This is in addition to the moderate-intensity exercise.  Maintain a healthy weight  Body mass index (BMI) is a measurement that can be used to identify possible weight problems. It estimates body fat based on height and weight.  Your health care provider can help determine your BMI and help you achieve or maintain a healthy weight.  For females 24 years of age and older:   A BMI below 18.5 is considered underweight.  A BMI of 18.5 to 24.9 is normal.  A BMI of 25 to 29.9 is considered overweight.  A BMI of 30 and above is considered obese.  Watch levels of cholesterol and blood lipids  You should start having your blood tested for lipids and cholesterol at 63 years of age, then have this test every 5 years.  You may need to have your cholesterol levels checked more often if:  Your lipid or cholesterol levels are high.  You are older than 63 years of age.  You are at high risk for heart disease.  CANCER SCREENING   Lung Cancer  Lung cancer screening is recommended for adults 51-57 years old who are at high risk for lung cancer because of a history of smoking.  A yearly low-dose CT scan of the lungs is recommended for people who:  Currently smoke.  Have quit within the past 15 years.  Have at least a 30-pack-year history of smoking. A pack year is smoking an average of one pack of cigarettes a day for 1 year.  Yearly screening should continue until it has been 15 years since you quit.  Yearly screening should stop if you develop a health problem that would prevent you from having lung cancer treatment.  Breast Cancer  Practice breast self-awareness. This means understanding  how your breasts normally appear and feel.  It also means doing regular breast self-exams. Let your health care provider know about any changes, no matter how small.  If you are in your 20s or 30s, you should have a clinical breast exam (CBE) by a health care provider every 1-3 years as part of a regular health exam.  If you are 56 or older, have a CBE every year. Also consider having a breast X-ray (mammogram) every year.  If you have a family history of breast cancer, talk to your health care provider about genetic screening.  If you are at high risk for breast cancer, talk to your health care provider about having an MRI and a mammogram every year.  Breast cancer gene (BRCA) assessment is recommended for women who have family members with BRCA-related cancers. BRCA-related cancers include:  Breast.  Ovarian.  Tubal.  Peritoneal cancers.  Results of the assessment will determine the need for genetic counseling and BRCA1 and BRCA2 testing. Cervical Cancer Your health care provider may recommend that you be screened regularly for cancer of the pelvic organs (ovaries, uterus, and vagina). This screening involves a pelvic examination, including checking for microscopic changes to the surface of your cervix (Pap test). You may be encouraged to have this screening done every 3 years, beginning at age 37.  For women ages 22-65, health care providers may recommend pelvic exams and Pap testing every 3 years, or they may recommend the Pap and pelvic exam, combined with testing for human papilloma virus (HPV), every 5 years. Some types of HPV increase your risk of cervical cancer. Testing for HPV may also be done on women of any age with unclear Pap test results.  Other health care providers may not recommend any screening for nonpregnant women who are considered low risk for pelvic cancer and who do not have symptoms. Ask your health care provider if a screening pelvic exam is right for  you.  If you have had past treatment for cervical cancer or a condition that could lead to cancer, you need Pap tests and screening for cancer for at least 20 years after your treatment. If Pap tests have been discontinued, your risk factors (such as having a new sexual partner) need to be reassessed to determine if screening should resume. Some women have medical problems that increase the chance of getting cervical cancer. In these cases, your health care provider may recommend more frequent screening and Pap tests. Colorectal Cancer  This type of cancer can be detected and often prevented.  Routine colorectal cancer screening usually begins at 63 years of age and continues through 63 years of age.  Your health care provider may recommend screening at an earlier age if you have risk factors for colon cancer.  Your health care provider may also recommend using home test kits to check for hidden blood in the stool.  A small camera at the end of a tube can be used to examine your colon directly (sigmoidoscopy or colonoscopy). This is done to check for the earliest forms of colorectal cancer.  Routine screening usually begins at age 41.  Direct examination of the colon should be repeated every 5-10 years through 63 years of age. However, you may need to be screened more often if early forms of precancerous polyps or small growths are found. Skin Cancer  Check your skin from head to toe regularly.  Tell your health care provider about any new moles or changes in moles, especially if there is a change in a mole's shape or color.  Also tell your health care provider if you have a mole that is larger than the size of a pencil eraser.  Always use sunscreen. Apply sunscreen liberally and repeatedly throughout the day.  Protect yourself by wearing long sleeves, pants, a wide-brimmed hat, and sunglasses whenever you are outside. HEART DISEASE, DIABETES, AND HIGH BLOOD PRESSURE   High blood  pressure causes heart disease and increases the risk of stroke. High blood pressure is more likely to develop in:  People who have blood pressure in the high end of the normal range (130-139/85-89 mm Hg).  People who are overweight or obese.  People who are African American.  If you are 22-82 years of age, have your blood pressure checked every 3-5 years. If you are 60 years of age or older, have your blood pressure checked every year. You should have your blood pressure measured twice--once when you are at a hospital or clinic, and once when you are not at a hospital or clinic. Record the average of the two measurements. To check your blood pressure when you are not at a hospital or clinic, you can use:  An automated blood pressure machine at a pharmacy.  A home blood pressure monitor.  If you are between 58 years and 31 years old, ask your health care provider if you should take aspirin to prevent strokes.  Have regular diabetes screenings. This involves taking a blood sample to check your fasting blood sugar level.  If you are at a normal weight and have a low risk for diabetes, have this test once every three years after 63 years of age.  If you are overweight and have a high risk for diabetes, consider being tested at a younger age or more often. PREVENTING INFECTION  Hepatitis B  If you have a higher risk for hepatitis B, you should be screened for this virus. You are considered  at high risk for hepatitis B if:  You were born in a country where hepatitis B is common. Ask your health care provider which countries are considered high risk.  Your parents were born in a high-risk country, and you have not been immunized against hepatitis B (hepatitis B vaccine).  You have HIV or AIDS.  You use needles to inject street drugs.  You live with someone who has hepatitis B.  You have had sex with someone who has hepatitis B.  You get hemodialysis treatment.  You take certain  medicines for conditions, including cancer, organ transplantation, and autoimmune conditions. Hepatitis C  Blood testing is recommended for:  Everyone born from [birth year] through 1963.  Anyone with known risk factors for hepatitis C. Sexually transmitted infections (STIs)  You should be screened for sexually transmitted infections (STIs) including gonorrhea and chlamydia if:  You are sexually active and are younger than 63 years of age.  You are older than 63 years of age and your health care provider tells you that you are at risk for this type of infection.  Your sexual activity has changed since you were last screened and you are at an increased risk for chlamydia or gonorrhea. Ask your health care provider if you are at risk.  If you do not have HIV, but are at risk, it may be recommended that you take a prescription medicine daily to prevent HIV infection. This is called pre-exposure prophylaxis (PrEP). You are considered at risk if:  You are sexually active and do not regularly use condoms or know the HIV status of your partner(s).  You take drugs by injection.  You are sexually active with a partner who has HIV. Talk with your health care provider about whether you are at high risk of being infected with HIV. If you choose to begin PrEP, you should first be tested for HIV. You should then be tested every 3 months for as long as you are taking PrEP.  PREGNANCY   If you are premenopausal and you may become pregnant, ask your health care provider about preconception counseling.  If you may become pregnant, take 400 to 800 micrograms (mcg) of folic acid every day.  If you want to prevent pregnancy, talk to your health care provider about birth control (contraception). OSTEOPOROSIS AND MENOPAUSE   Osteoporosis is a disease in which the bones lose minerals and strength with aging. This can result in serious bone fractures. Your risk for osteoporosis can be identified using a bone  density scan.  If you are 78 years of age or older, or if you are at risk for osteoporosis and fractures, ask your health care provider if you should be screened.  Ask your health care provider whether you should take a calcium or vitamin D supplement to lower your risk for osteoporosis.  Menopause may have certain physical symptoms and risks.  Hormone replacement therapy may reduce some of these symptoms and risks. Talk to your health care provider about whether hormone replacement therapy is right for you.  HOME CARE INSTRUCTIONS   Schedule regular health, dental, and eye exams.  Stay current with your immunizations.   Do not use any tobacco products including cigarettes, chewing tobacco, or electronic cigarettes.  If you are pregnant, do not drink alcohol.  If you are breastfeeding, limit how much and how often you drink alcohol.  Limit alcohol intake to no more than 1 drink per day for nonpregnant women. One drink equals 12 ounces of  beer, 5 ounces of wine, or 1 ounces of hard liquor.  Do not use street drugs.  Do not share needles.  Ask your health care provider for help if you need support or information about quitting drugs.  Tell your health care provider if you often feel depressed.  Tell your health care provider if you have ever been abused or do not feel safe at home.   This information is not intended to replace advice given to you by your health care provider. Make sure you discuss any questions you have with your health care provider.   Document Released: 10/04/2010 Document Revised: 04/11/2014 Document Reviewed: 02/20/2013 Elsevier Interactive Patient Education [employer].

## 2016-02-03 DIAGNOSIS — D2272 Melanocytic nevi of left lower limb, including hip: Secondary | ICD-10-CM | POA: Diagnosis not present

## 2016-02-03 DIAGNOSIS — L304 Erythema intertrigo: Secondary | ICD-10-CM | POA: Diagnosis not present

## 2016-02-03 DIAGNOSIS — L821 Other seborrheic keratosis: Secondary | ICD-10-CM | POA: Diagnosis not present

## 2016-02-03 DIAGNOSIS — L820 Inflamed seborrheic keratosis: Secondary | ICD-10-CM | POA: Diagnosis not present

## 2016-02-03 DIAGNOSIS — D2271 Melanocytic nevi of right lower limb, including hip: Secondary | ICD-10-CM | POA: Diagnosis not present

## 2016-02-08 ENCOUNTER — Ambulatory Visit: Payer: Medicare Other | Admitting: Family Medicine

## 2016-02-08 ENCOUNTER — Encounter: Payer: Self-pay | Admitting: Family Medicine

## 2016-02-08 ENCOUNTER — Ambulatory Visit (INDEPENDENT_AMBULATORY_CARE_PROVIDER_SITE_OTHER): Payer: Medicare Other | Admitting: Family Medicine

## 2016-02-08 VITALS — BP 142/98 | HR 75 | Temp 99.0°F | Resp 16 | Wt 175.1 lb

## 2016-02-08 DIAGNOSIS — I10 Essential (primary) hypertension: Secondary | ICD-10-CM | POA: Diagnosis not present

## 2016-02-08 MED ORDER — CARVEDILOL 25 MG PO TABS: 25.0000 mg | ORAL_TABLET | Freq: Two times a day (BID) | ORAL | 3 refills | Status: DC

## 2016-02-08 NOTE — Progress Notes (Signed)
Pre visit review using our clinic review tool, if applicable. No additional management support is needed unless otherwise documented below in the visit note. 

## 2016-02-08 NOTE — Progress Notes (Signed)
OFFICE VISIT  02/08/2016   CC:  Chief Complaint  Patient presents with  . Follow-up    HTN, Cervicalgia     HPI:    Patient is a 65 y.o. Caucasian female who presents for 6 week f/u HTN, was having episodic elevations at last visit so I increased her lisinopril to 40mg  qd.  Also referred pt to PT for cervicalgia that was leading to occipital, then generalized HAs.  Home bp: range 123456, diastolics 123XX123.  Pt not monitoring HR. She could not afford PT so she did not get started with this. Feeling HAs still, more in temples.  Not in occiput or generalized anymore.  ROS: feet burning on bottoms recently, gradually improving over last couple days.  She says her fibromyalgia has done this in the past.    Past Medical History:  Diagnosis Date  . Alcoholism in remission [facility])    states last alcohol 11 yrs ago  . Anxiety   . Bipolar 1 disorder [facility] [place])    sees a psychiatrist  . Cervical spondylosis without myelopathy 12/19/2013   MRI 02/2014: multilevel DDD/spondylosis, not much change compared to prior MRI.  Pt not in favor of invasive therapy for her neck as of 04/2014.  Marland Kitchen Chronic renal insufficiency, stage II (mild)   . COPD (chronic obstructive pulmonary disease) ([place])   . Environmental allergies   . Fibromyalgia   . GERD (gastroesophageal reflux disease)   . Hiatal hernia   . History of adenomatous polyp of colon 04/2008; 08/2014   No high grade dysplasia: recall 5 yrs ([name], [name])  . History of basal cell carcinoma excision    NOSE  . History of Helicobacter pylori infection 04/2008   +gastric biopsy (gastritis but no metaplasia, dysplasia, or malignancy identified)  . History of TIA (transient ischemic attack)    secondary to HELLP syndrome 1994 post c/s--  residual memory loss  . Hyperlipidemia    no meds in > a decade per pt  . Hypertension   . Internal hemorrhoids   . Left ureteral calculus 08/2013   Perc nephr--cystoscopy w/ureteroscopy + laser for stone  removal.  Residual asymptomatic left renal nephrolithiasis <69mm present post-procedure.  . Memory loss    Abnl MRI brain and CT brain c/w chronic microvascular ischemia.  Repeat MRI brain 02/2014 stable (Dr. Mayra Reel with no plan to f/u with neuro as of 04/2014)  . Osteoporosis 05/2010   2012 'penia; 06/2015 'porosis:  repeat DEXA 2 yrs  . Shortness of breath   . Sprain of neck 12/19/2013  . TMJ (temporomandibular joint disorder)    USES  MOUTH GUARD  . Vitamin D deficiency 05/2011    Past Surgical History:  Procedure Laterality Date  . ANTERIOR CERVICAL DECOMP/DISCECTOMY FUSION  06-30-2009   C3 -- C5  AND EXPLORATION OF FUSION C5-7 W/  PLATE REMOVAL  . CERVICAL FUSION  2002   anterior C5 -- C7  . CERVICAL SPINE SURGERY  08-27-1999     C6 -- T1  LAMINECTOMY/  DISKECTOMY  . CESAREAN SECTION  1994  . COLONOSCOPY W/ POLYPECTOMY  04/2008;08/2014   2016 tubular adenoma x 1; +diverticulosis and int/ext hemorrhoids.  Recall 5 yrs  . CYSTOSCOPY WITH URETEROSCOPY AND STENT PLACEMENT Left 08/28/2013   Procedure: CYSTOSCOPY, RGP,  WITH URETEROSCOPY AND STENT REMOVAL;  Surgeon: [name], MD;  Location: [facility];  Service: Urology;  Laterality: Left;  . ESOPHAGOGASTRODUODENOSCOPY  2010; 08/2014   +Candidal esophagitis; Mild chronic gastritis  w/intestinal metaplasia--NEG H pylori, neg for eosinophilic esoph  . HOLMIUM LASER APPLICATION Left [id number]   Procedure: HOLMIUM LASER APPLICATION;  Surgeon: [name], MD;  Location: [facility];  Service: Urology;  Laterality: Left;  . NEGATIVE SLEEP STUDY  04-01-2007  . NEPHROLITHOTOMY Left 08/05/2013   Procedure: NEPHROLITHOTOMY PERCUTANEOUS;  Surgeon: [name], MD;  Location: [facility];  Service: Urology;  Laterality: Left;  . TONSILLECTOMY AND ADENOIDECTOMY  1975  . TOTAL KNEE ARTHROPLASTY Right 2003  . ULTRASOUND EXAM,PELVIC COMPLETE ([facility] HX)  06/25/15   NORMAL (done by GYN)    Outpatient Medications  Prior to Visit  Medication Sig Dispense Refill  . ADVAIR DISKUS 250-50 MCG/DOSE AEPB INHALE 1 PUFF INTO THE LUNGS 2 (TWO) TIMES DAILY. 60 each 5  . albuterol (PROAIR HFA) 108 (90 Base) MCG/ACT inhaler INHALE 2 PUFFS INTO THE LUNGS EVERY 4 (FOUR) HOURS AS NEEDED FOR WHEEZING OR SHORTNESS OF BREATH. 8.5 each 1  . ALPRAZolam (XANAX) 1 MG tablet Take 1 mg by mouth 3 (three) times daily as needed.     . B Complex-C (B-COMPLEX WITH VITAMIN C) tablet Take 1 tablet by mouth daily.    . calcium & magnesium carbonates (MYLANTA) [id number] MG tablet Take 1 tablet by mouth daily.    . cetirizine (ZYRTEC) 10 MG tablet Take 10 mg by mouth every morning.     . cyclobenzaprine (FLEXERIL) 10 MG tablet Take 10 mg by mouth 2 (two) times daily as needed.     . fluticasone (FLONASE) 50 MCG/ACT nasal spray Place 2 sprays into both nostrils daily. 16 g 12  . INCRUSE ELLIPTA 62.5 MCG/INH AEPB     . lamoTRIgine (LAMICTAL) 100 MG tablet Take 3 tablets (300 mg total) by mouth at bedtime. 90 tablet 11  . lisinopril (PRINIVIL,ZESTRIL) 40 MG tablet Take 1 tablet (40 mg total) by mouth daily. 30 tablet 11  . montelukast (SINGULAIR) 10 MG tablet Take 1 tablet (10 mg total) by mouth every evening. 30 tablet 12  . Multiple Vitamin (MULTIVITAMIN) tablet Take 1 tablet by mouth daily.    . Oxycodone HCl 10 MG TABS Take 1 tablet by mouth 3 (three) times daily.    . polyethylene glycol powder (GLYCOLAX/MIRALAX) powder Take 17 g by mouth daily. 3162 g 12  . ranitidine (ZANTAC) 150 MG capsule Take 150 mg by mouth 2 (two) times daily as needed.     . TURMERIC PO Take by mouth.    . venlafaxine XR (EFFEXOR-XR) 150 MG 24 hr capsule Take 1 capsule (150 mg total) by mouth daily with breakfast. 30 capsule 11  . zolpidem (AMBIEN) 10 MG tablet Take 10 mg by mouth at bedtime.    . carvedilol (COREG) 12.5 MG tablet Take 1 tablet (12.5 mg total) by mouth 2 (two) times daily with a meal. 60 tablet 11  . Albuterol Sulfate (PROAIR RESPICLICK) 123XX123 (90  Base) MCG/ACT AEPB Inhale 0.65 g into the lungs as needed. 1 each 0   No facility-administered medications prior to visit.     Allergies  Allergen Reactions  . Ceftin [Cefuroxime Axetil] Anaphylaxis    Swelling of eyes and tongue  . Fosamax [Alendronate Sodium] Diarrhea  . Morphine And Related Other (See Comments)    Hallucinations/ disoriented  . Prednisone Other (See Comments)    Hyperactivity    ROS As per HPI  PE: Blood pressure (!) 142/98, pulse 75, temperature 99 F (37.2 C), temperature source Temporal, resp. rate 16, weight 175  lb 1.9 oz (79.4 kg), SpO2 95 %. Gen: Alert, well appearing.  Patient is oriented to person, place, time, and situation. AFFECT: pleasant, lucid thought and speech. CV: RRR, no m/r/g.   LUNGS: CTA bilat, nonlabored resps, good aeration in all lung fields. EXT: no clubbing, cyanosis, or edema.    LABS:  Lab Results  Component Value Date   TSH 1.10 06/11/2015   Lab Results  Component Value Date   WBC 6.8 06/11/2015   HGB 12.1 06/11/2015   HCT 35.4 (L) 06/11/2015   MCV 88.4 06/11/2015   PLT 279.0 06/11/2015   Lab Results  Component Value Date   CREATININE 0.94 06/11/2015   BUN 12 06/11/2015   NA 139 06/11/2015   K 4.6 06/11/2015   CL 101 06/11/2015   CO2 32 06/11/2015   Lab Results  Component Value Date   ALT 11 06/11/2015   AST 14 06/11/2015   ALKPHOS 51 06/11/2015   BILITOT 0.6 06/11/2015   Lab Results  Component Value Date   CHOL 235 (H) 06/11/2015   Lab Results  Component Value Date   HDL 61.40 06/11/2015   Lab Results  Component Value Date   LDLCALC 151 (H) 06/11/2015   Lab Results  Component Value Date   TRIG 109.0 06/11/2015   Lab Results  Component Value Date   CHOLHDL 4 06/11/2015     IMPRESSION AND PLAN:  1) HTN, not well controlled. Pt stated that she once took an extra coreg and her bp was down to 120s and she felt much improved. I think she can tolerate an increase in her coreg to 25mg  bid.   Continue lisinopril 40mg  qd. Continue home bp monitoring. Her fibromyalgia is pretty stable at this time.  An After Visit Summary was printed and given to the patient.  FOLLOW UP: Return in about 6 weeks (around 03/21/2016) for f/u HTN.  Signed:  Crissie Sickles, MD           02/08/2016

## 2016-02-22 ENCOUNTER — Ambulatory Visit (INDEPENDENT_AMBULATORY_CARE_PROVIDER_SITE_OTHER): Payer: Medicare Other

## 2016-02-22 ENCOUNTER — Ambulatory Visit (INDEPENDENT_AMBULATORY_CARE_PROVIDER_SITE_OTHER): Payer: Medicare Other | Admitting: Podiatry

## 2016-02-22 DIAGNOSIS — M21619 Bunion of unspecified foot: Secondary | ICD-10-CM | POA: Diagnosis not present

## 2016-02-22 DIAGNOSIS — M779 Enthesopathy, unspecified: Secondary | ICD-10-CM | POA: Diagnosis not present

## 2016-02-22 DIAGNOSIS — M7752 Other enthesopathy of left foot: Secondary | ICD-10-CM | POA: Diagnosis not present

## 2016-02-22 MED ORDER — TRIAMCINOLONE ACETONIDE 10 MG/ML IJ SUSP
10.0000 mg | Freq: Once | INTRAMUSCULAR | Status: AC
  Administered 2016-02-22: 10 mg

## 2016-02-22 NOTE — Progress Notes (Signed)
Subjective:     Patient ID: Erin Lopez, female   DOB: 30-Sep-1950, 65 y.o.   MRN: [id number]  HPI patient states the area you worked on before seems to be doing okay but I'm having pain in this toe joint and I know on getting need to have my bunion fixed and I know on getting need to have it done relatively soon. Patient states she's tried wider shoes soaks and pads without relief of symptoms   Review of Systems  All other systems reviewed and are negative.      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 At this time we will continue conservative care and I did a proximal nerve block aspirated the joint giving out a small amount of clear fluid and injected with a quarter cc deck some some Kenalog and applied padding and instructed on not going barefoot. Reappoint one week and again we are getting need to consider surgical intervention in this particular case  X-ray report indicates significant structural bunion deformity left with elevation of the intermetatarsal angle of 16 approximate with arthritis of the second MPJ left with spurring and narrowness of the joint noted

## 2016-02-29 ENCOUNTER — Ambulatory Visit (INDEPENDENT_AMBULATORY_CARE_PROVIDER_SITE_OTHER): Payer: Medicare Other | Admitting: Podiatry

## 2016-02-29 DIAGNOSIS — M21619 Bunion of unspecified foot: Secondary | ICD-10-CM

## 2016-02-29 DIAGNOSIS — M779 Enthesopathy, unspecified: Secondary | ICD-10-CM | POA: Diagnosis not present

## 2016-02-29 DIAGNOSIS — M2042 Other hammer toe(s) (acquired), left foot: Secondary | ICD-10-CM | POA: Diagnosis not present

## 2016-02-29 NOTE — Progress Notes (Signed)
Subjective:     Patient ID: Erin Lopez, female   DOB: [birth year], 65 y.o.   MRN: [id number]  HPI patient states my left foot is improved a little bit but I know on getting need to have it fixed and it only improved for the most part for a short period of time   Review of Systems     Objective:   Physical Exam Neurovascular status intact muscle strength adequate with inflammation and swelling around the second MPJ left with structural bunion deformity left and hammertoe deformity third left that's moderately tender when pressed. The joint is still sore with some decrease of discomfort secondary to previous treatment    Assessment:     Chronic arthritis second MPJ left with structural bunion deformity and symptoms left with redness and pain along with hammertoe deformity third left with failure to respond to conservative care    Plan:     H&P and reviewed condition. I do think this left second MPJ is can require small implant procedure due to advanced arthritis along with structural bunion correction and hammertoe repair distal third left. I reviewed all findings with patient she wants to get this done but needs to wait till the first the year and we will have her in first week in January to discuss in greater detail

## 2016-03-03 ENCOUNTER — Ambulatory Visit: Payer: Medicare Other | Admitting: Family Medicine

## 2016-03-08 DIAGNOSIS — Z79891 Long term (current) use of opiate analgesic: Secondary | ICD-10-CM | POA: Diagnosis not present

## 2016-03-08 DIAGNOSIS — M797 Fibromyalgia: Secondary | ICD-10-CM | POA: Diagnosis not present

## 2016-03-08 DIAGNOSIS — M150 Primary generalized (osteo)arthritis: Secondary | ICD-10-CM | POA: Diagnosis not present

## 2016-03-08 DIAGNOSIS — Z791 Long term (current) use of non-steroidal anti-inflammatories (NSAID): Secondary | ICD-10-CM | POA: Diagnosis not present

## 2016-03-10 DIAGNOSIS — H01001 Unspecified blepharitis right upper eyelid: Secondary | ICD-10-CM | POA: Diagnosis not present

## 2016-03-10 DIAGNOSIS — H01004 Unspecified blepharitis left upper eyelid: Secondary | ICD-10-CM | POA: Diagnosis not present

## 2016-03-16 ENCOUNTER — Emergency Department (HOSPITAL_COMMUNITY)
Admission: EM | Admit: 2016-03-16 | Discharge: 2016-03-16 | Disposition: A | Discharge status: Home / Self Care | Payer: Medicare Other | Attending: Emergency Medicine | Admitting: Emergency Medicine

## 2016-03-16 ENCOUNTER — Emergency Department (HOSPITAL_COMMUNITY): Payer: Medicare Other

## 2016-03-16 ENCOUNTER — Encounter (HOSPITAL_COMMUNITY): Payer: Self-pay

## 2016-03-16 DIAGNOSIS — Y929 Unspecified place or not applicable: Secondary | ICD-10-CM | POA: Insufficient documentation

## 2016-03-16 DIAGNOSIS — W5511XA Bitten by horse, initial encounter: Secondary | ICD-10-CM | POA: Diagnosis not present

## 2016-03-16 DIAGNOSIS — Y939 Activity, unspecified: Secondary | ICD-10-CM | POA: Insufficient documentation

## 2016-03-16 DIAGNOSIS — Y999 Unspecified external cause status: Secondary | ICD-10-CM | POA: Insufficient documentation

## 2016-03-16 DIAGNOSIS — S51852A Open bite of left forearm, initial encounter: Secondary | ICD-10-CM | POA: Diagnosis not present

## 2016-03-16 DIAGNOSIS — Z96651 Presence of right artificial knee joint: Secondary | ICD-10-CM | POA: Diagnosis not present

## 2016-03-16 DIAGNOSIS — Z87891 Personal history of nicotine dependence: Secondary | ICD-10-CM | POA: Insufficient documentation

## 2016-03-16 DIAGNOSIS — S51812A Laceration without foreign body of left forearm, initial encounter: Secondary | ICD-10-CM | POA: Insufficient documentation

## 2016-03-16 DIAGNOSIS — J449 Chronic obstructive pulmonary disease, unspecified: Secondary | ICD-10-CM | POA: Diagnosis not present

## 2016-03-16 DIAGNOSIS — I129 Hypertensive chronic kidney disease with stage 1 through stage 4 chronic kidney disease, or unspecified chronic kidney disease: Secondary | ICD-10-CM | POA: Diagnosis not present

## 2016-03-16 DIAGNOSIS — N182 Chronic kidney disease, stage 2 (mild): Secondary | ICD-10-CM | POA: Insufficient documentation

## 2016-03-16 DIAGNOSIS — T148XXA Other injury of unspecified body region, initial encounter: Secondary | ICD-10-CM

## 2016-03-16 DIAGNOSIS — Z79899 Other long term (current) drug therapy: Secondary | ICD-10-CM | POA: Diagnosis not present

## 2016-03-16 DIAGNOSIS — Z8673 Personal history of transient ischemic attack (TIA), and cerebral infarction without residual deficits: Secondary | ICD-10-CM | POA: Insufficient documentation

## 2016-03-16 MED ORDER — AMOXICILLIN-POT CLAVULANATE 875-125 MG PO TABS: 1.0000 | ORAL_TABLET | Freq: Two times a day (BID) | ORAL | 0 refills | Status: DC

## 2016-03-16 MED ORDER — HYDROMORPHONE HCL 1 MG/ML IJ SOLN
0.5000 mg | Freq: Once | INTRAMUSCULAR | Status: AC
  Administered 2016-03-16: 0.5 mg via INTRAVENOUS
  Filled 2016-03-16: qty 1

## 2016-03-16 MED ORDER — SODIUM BICARBONATE 4 % IV SOLN
5.0000 mL | Freq: Once | INTRAVENOUS | Status: AC
  Administered 2016-03-16: 5 mL via SUBCUTANEOUS
  Filled 2016-03-16: qty 5

## 2016-03-16 MED ORDER — LIDOCAINE HCL 1 % IJ SOLN
INTRAMUSCULAR | Status: AC
  Administered 2016-03-16: 20 mL
  Filled 2016-03-16: qty 20

## 2016-03-16 MED ORDER — LIDOCAINE HCL (PF) 1 % IJ SOLN: 5.0000 mL | Freq: Once | INTRAMUSCULAR | Status: DC

## 2016-03-16 MED ORDER — NAPROXEN 500 MG PO TABS: 500.0000 mg | ORAL_TABLET | Freq: Two times a day (BID) | ORAL | 0 refills | Status: DC

## 2016-03-16 NOTE — ED Triage Notes (Signed)
PT C/O A HORSE BITE TO THE LEFT WRIST 1 HR AGO. PT STS SHE WAS FEEDING THE HORSE WHEN SHE WAS BITTEN. DENIES ANY OTHER INJURIES.

## 2016-03-16 NOTE — ED Provider Notes (Signed)
Patient was bitten by her own horse at dorsum of left distal forearm one hour ago. No other injury. She complains of pain at distal forearm.. Pain is worse with dorsiflexing her wrist. No other injury. Exam alert nontoxic left upper extremity with a flap laceration distal dorsal forearm immediately proximal to the  crease of wrist. She is able to extend the wrist with pain. Radial pulse 2+. Good capillary refill   Orlie Dakin, MD 03/16/16 1850

## 2016-03-16 NOTE — ED Provider Notes (Signed)
[facility] DEPT Provider Note   CSN: [id number] Arrival date & time: 03/16/16  1814  By signing my name below, I, Neta Mends, attest that this documentation has been prepared under the direction and in the presence of Melina Schools, PA-C. Electronically Signed: Neta Mends, ED Scribe. 03/16/2016. 6:38 PM.    History   Chief Complaint Chief Complaint  Patient presents with  . Animal Bite    HORSE    The history is provided by the patient. No language interpreter was used.   HPI Comments:  Erin Lopez is a 65 y.o. female who presents to the Emergency Department here due to a horse bite that she sustained PTA. Pt reports that she went to her stable to feed her horses and one of the horses bit her left forearm. Pt's daughter states that the horse is a rescue. Tetanus is UTD. No alleviating factors noted. Pt denies any decreased rom or numbness/tingling,  Past Medical History:  Diagnosis Date  . Alcoholism in remission [facility])    states last alcohol 11 yrs ago  . Anxiety   . Bipolar 1 disorder [facility])    sees a psychiatrist  . Cervical spondylosis without myelopathy 12/19/2013   MRI 02/2014: multilevel DDD/spondylosis, not much change compared to prior MRI.  Pt not in favor of invasive therapy for her neck as of 04/2014.  Marland Kitchen Chronic renal insufficiency, stage II (mild)   . COPD (chronic obstructive pulmonary disease) ([place])   . Environmental allergies   . Fibromyalgia   . GERD (gastroesophageal reflux disease)   . Hiatal hernia   . History of adenomatous polyp of colon 04/2008; 08/2014   No high grade dysplasia: recall 5 yrs ([name], [name])  . History of basal cell carcinoma excision    NOSE  . History of Helicobacter pylori infection 04/2008   +gastric biopsy (gastritis but no metaplasia, dysplasia, or malignancy identified)  . History of TIA (transient ischemic attack)    secondary to HELLP syndrome 1994 post c/s--  residual memory loss  .  Hyperlipidemia    no meds in > a decade per pt  . Hypertension   . Internal hemorrhoids   . Left ureteral calculus 08/2013   Perc nephr--cystoscopy w/ureteroscopy + laser for stone removal.  Residual asymptomatic left renal nephrolithiasis <9mm present post-procedure.  . Memory loss    Abnl MRI brain and CT brain c/w chronic microvascular ischemia.  Repeat MRI brain 02/2014 stable (Dr. Mayra Reel with no plan to f/u with neuro as of 04/2014)  . Osteoporosis 05/2010   2012 'penia; 06/2015 'porosis:  repeat DEXA 2 yrs  . Shortness of breath   . Sprain of neck 12/19/2013  . TMJ (temporomandibular joint disorder)    USES  MOUTH GUARD  . Vitamin D deficiency 05/2011    Patient Active Problem List   Diagnosis Date Noted  . Osteoporosis 09/30/2015  . Health maintenance examination 06/11/2015  . Suprapubic discomfort 06/05/2015  . Urethral caruncle 06/05/2015  . Infection of urinary tract 06/05/2015  . TMJ (temporomandibular joint disorder) 02/25/2015  . Right ankle injury 11/05/2014  . Cervical spondylosis without myelopathy 12/19/2013  . Sprain of neck 12/19/2013  . Memory difficulties 12/19/2013  . Postnasal drip 10/28/2013  . Renal calculus 08/05/2013  . Urinary frequency 08/01/2013  . Constipation, chronic 07/29/2013  . COPD (chronic obstructive pulmonary disease) ([place]) 06/30/2013  . Abnormal chest x-ray 06/30/2013  . Benign essential HTN 05/29/2013  . Kidney stone on left side 05/29/2013  .  Hyperlipidemia 05/29/2013    Past Surgical History:  Procedure Laterality Date  . ANTERIOR CERVICAL DECOMP/DISCECTOMY FUSION  06-30-2009   C3 -- C5  AND EXPLORATION OF FUSION C5-7 W/  PLATE REMOVAL  . CERVICAL FUSION  2002   anterior C5 -- C7  . CERVICAL SPINE SURGERY  08-27-1999     C6 -- T1  LAMINECTOMY/  DISKECTOMY  . CESAREAN SECTION  1994  . COLONOSCOPY W/ POLYPECTOMY  04/2008;08/2014   2016 tubular adenoma x 1; +diverticulosis and int/ext hemorrhoids.  Recall 5 yrs  . CYSTOSCOPY  WITH URETEROSCOPY AND STENT PLACEMENT Left 08/28/2013   Procedure: CYSTOSCOPY, RGP,  WITH URETEROSCOPY AND STENT REMOVAL;  Surgeon: [name], MD;  [facility];  Service: Urology;  Laterality: Left;  . ESOPHAGOGASTRODUODENOSCOPY  2010; 08/2014   +Candidal esophagitis; Mild chronic gastritis w/intestinal metaplasia--NEG H pylori, neg for eosinophilic esoph  . HOLMIUM LASER APPLICATION Left [id number]   Procedure: HOLMIUM LASER APPLICATION;  Surgeon: [name], MD;  [facility];  Service: Urology;  Laterality: Left;  . NEGATIVE SLEEP STUDY  04-01-2007  . NEPHROLITHOTOMY Left 08/05/2013   Procedure: NEPHROLITHOTOMY PERCUTANEOUS;  Surgeon: [name], MD;  [facility];  Service: Urology;  Laterality: Left;  . TONSILLECTOMY AND ADENOIDECTOMY  1975  . TOTAL KNEE ARTHROPLASTY Right 2003  . ULTRASOUND EXAM,PELVIC COMPLETE ([facility] HX)  06/25/15   NORMAL (done by GYN)    OB History    No data available       Home Medications    Prior to Admission medications   Medication Sig Start Date End Date Taking? Authorizing Provider  ADVAIR DISKUS 250-50 MCG/DOSE AEPB INHALE 1 PUFF INTO THE LUNGS 2 (TWO) TIMES DAILY. 12/09/15   Praveen Mannam, MD  albuterol (PROAIR HFA) 108 (90 Base) MCG/ACT inhaler INHALE 2 PUFFS INTO THE LUNGS EVERY 4 (FOUR) HOURS AS NEEDED FOR WHEEZING OR SHORTNESS OF BREATH. 09/17/15   Tammi Sou, MD  ALPRAZolam Duanne Moron) 1 MG tablet Take 1 mg by mouth 3 (three) times daily as needed.     Historical Provider, MD  B Complex-C (B-COMPLEX WITH VITAMIN C) tablet Take 1 tablet by mouth daily.    Historical Provider, MD  calcium & magnesium carbonates (MYLANTA) [id number] MG tablet Take 1 tablet by mouth daily.    Historical Provider, MD  carvedilol (COREG) 25 MG tablet Take 1 tablet (25 mg total) by mouth 2 (two) times daily with a meal. 02/08/16   Tammi Sou, MD  cetirizine (ZYRTEC) 10 MG tablet Take 10 mg by mouth every  morning.     Historical Provider, MD  cyclobenzaprine (FLEXERIL) 10 MG tablet Take 10 mg by mouth 2 (two) times daily as needed.  06/02/15   Historical Provider, MD  fluticasone (FLONASE) 50 MCG/ACT nasal spray Place 2 sprays into both nostrils daily. 09/17/15   Tammi Sou, MD  INCRUSE ELLIPTA 62.5 MCG/INH AEPB  12/21/15   Historical Provider, MD  lamoTRIgine (LAMICTAL) 100 MG tablet Take 3 tablets (300 mg total) by mouth at bedtime. 09/17/15   Tammi Sou, MD  lisinopril (PRINIVIL,ZESTRIL) 40 MG tablet Take 1 tablet (40 mg total) by mouth daily. 12/25/15 12/24/16  Tammi Sou, MD  montelukast (SINGULAIR) 10 MG tablet Take 1 tablet (10 mg total) by mouth every evening. 03/23/15   Tammi Sou, MD  Multiple Vitamin (MULTIVITAMIN) tablet Take 1 tablet by mouth daily.    Historical Provider, MD  Oxycodone HCl  10 MG TABS Take 1 tablet by mouth 3 (three) times daily. 06/10/15   Historical Provider, MD  polyethylene glycol powder (GLYCOLAX/MIRALAX) powder Take 17 g by mouth daily. 11/04/14   Tammi Sou, MD  ranitidine (ZANTAC) 150 MG capsule Take 150 mg by mouth 2 (two) times daily as needed.     Historical Provider, MD  TURMERIC PO Take by mouth.    Historical Provider, MD  venlafaxine XR (EFFEXOR-XR) 150 MG 24 hr capsule Take 1 capsule (150 mg total) by mouth daily with breakfast. 09/17/15   Tammi Sou, MD  zolpidem (AMBIEN) 10 MG tablet Take 10 mg by mouth at bedtime.    Historical Provider, MD    Family History Family History  Problem Relation Age of Onset  . Alcohol abuse Mother   . Arthritis Mother   . Hypertension Mother   . Hyperlipidemia Mother   . Diabetes Mother   . Parkinsonism Mother   . Breast cancer Mother   . Prostate cancer Father   . Hypertension Father   . Hyperlipidemia Father   . Diabetes Father   . Heart disease Father   . Esophageal cancer Sister   . Colon polyps Sister   . Colon polyps Brother   . Heart disease Brother     x 2  . Irritable  bowel syndrome Sister     Social History Social History  Substance Use Topics  . Smoking status: Former Smoker    Packs/day: 1.00    Years: 33.00    Types: Cigarettes    Quit date: 08/22/1993  . Smokeless tobacco: Never Used  . Alcohol use No     Comment: ALCOHOLIC IN REMISSION SINCE  2004     Allergies   Ceftin [cefuroxime axetil]; Fosamax [alendronate sodium]; Morphine and related; and Prednisone   Review of Systems Review of Systems  Musculoskeletal: Positive for arthralgias.  Skin: Positive for wound.  Neurological: Positive for numbness.  All other systems reviewed and are negative.    Physical Exam Updated Vital Signs BP 141/100 (BP Location: Left Arm)   Pulse 95   Temp 98.8 F (37.1 C) (Oral)   Resp 18   Ht 5\' 7"  (1.702 m)   Wt 170 lb (77.1 kg)   SpO2 96%   BMI 26.63 kg/m   Physical Exam  Constitutional: She is oriented to person, place, and time. She appears well-developed and well-nourished. No distress.  HENT:  Head: Normocephalic and atraumatic.  Eyes: Conjunctivae are normal.  Cardiovascular: Normal rate and intact distal pulses.   Pulmonary/Chest: Effort normal.  Abdominal: She exhibits no distension.  Musculoskeletal: Normal range of motion.  Patient with flap laceration over the left upper extremity over the distal dorsum of the forearm that is proximal to the wrist. She is able to extend the wrist with some pain. Patient has more pain with dorsiflexing of her wrist. Sensation is intact. Cap refill is normal. Radial pulses are 2+ bilaterally. Wound with debris. Tissue is macerated. Muscle, tendon and fat is exposed.    Neurological: She is alert and oriented to person, place, and time.  Skin: Skin is warm and dry. Capillary refill takes less than 2 seconds.  Psychiatric: She has a normal mood and affect.  Nursing note and vitals reviewed.          ED Treatments / Results  DIAGNOSTIC STUDIES:  Oxygen Saturation is 100% on RA, normal  by my interpretation.    COORDINATION OF CARE:  6:38 PM Discussed  treatment plan with pt at bedside and pt agreed to plan.   Labs (all labs ordered are listed, but only abnormal results are displayed) Labs Reviewed - No data to display  EKG  EKG Interpretation None       Radiology Dg Forearm Left  Result Date: 03/16/2016 CLINICAL DATA:  Bitten by a horse EXAM: LEFT FOREARM - 2 VIEW COMPARISON:  None. FINDINGS: Soft tissue injury of the left forearm with associated bandaging, most apparent at the distal radial aspect. No underlying osseous injury. IMPRESSION: No acute osseous abnormality of the left forearm. Electronically Signed   By: Ulyses Jarred M.D.   On: 03/16/2016 19:15    Procedures Procedures (including critical care time) Wound was washed with 1L of normal saline by PA student.Wound was injected with local infiltrate of 8 ml of 1% lidocaine with 30ml of sodium bicarb and achieved anesthesia to help with washout. Medications Ordered in ED Medications  HYDROmorphone (DILAUDID) injection 0.5 mg (0.5 mg Intravenous Given 03/16/16 1855)  sodium bicarbonate (NEUT) 4 % injection 5 mL (5 mLs Subcutaneous Given 03/16/16 1937)  lidocaine (XYLOCAINE) 1 % (with pres) injection (20 mLs  Given 03/16/16 1936)  HYDROmorphone (DILAUDID) injection 0.5 mg (0.5 mg Intravenous Given 03/16/16 1932)     Initial Impression / Assessment and Plan / ED Course  I have reviewed the triage vital signs and the nursing notes.  Pertinent labs & imaging results that were available during my care of the patient were reviewed by me and considered in my medical decision making (see chart for details).  Clinical Course   Patient presents to the ED with bite wound from a horse. Wound tissue is macerated and not well approximated. Bleeding is controlled with pressure dressing. Xray shows no bony evolvement. Tissue was numbed and washed with extensively with 1 L of normal saline. Will consult Dr.Thompson  with hand surgery to discuss recommendations. Patient care will be handed off PA Harris for further work up and recs from [employer].  Final Clinical Impressions(s) / ED Diagnoses   Final diagnoses:  None    New Prescriptions New Prescriptions   No medications on file  I personally performed the services described in this documentation, which was scribed in my presence. The recorded information has been reviewed and is accurate.     Doristine Devoid, PA-C 03/16/16 2037    Orlie Dakin, MD 03/16/16 [phone]

## 2016-03-16 NOTE — Discharge Instructions (Signed)
Read all of the attached information about home care and return precautions. Take your regular pain medicine as prescribed. You may also take the naproxen and otc tylenol for pain.  Get help right away if: You have a red streak extending away from your wound. You have fluid, blood, or pus coming from your wound. You have a fever or chills. You have trouble moving your injured area. You have numbness or tingling extending beyond the wound.

## 2016-03-16 NOTE — ED Provider Notes (Signed)
I discussed the case with Dr. Grandville Silos. He reviewed the images and we discussed his preferred closure method and approach. He will see her in his office in follow up and the patient will be discharged with Augmentin.     Marland Kitchen.Laceration Repair Date/Time: 03/16/2016 10:06 PM Performed by: Margarita Mail Authorized by: Margarita Mail   Consent:    Consent obtained:  Verbal   Consent given by:  Patient   Risks discussed:  Infection, pain, poor cosmetic result and nerve damage   Alternatives discussed:  Delayed treatment, no treatment, observation and referral Anesthesia (see MAR for exact dosages):    Anesthesia method:  Local infiltration   Local anesthetic:  Lidocaine 1% w/o epi Repair type:    Repair type:  Complex Pre-procedure details:    Preparation:  Patient was prepped and draped in usual sterile fashion Exploration:    Wound exploration: wound explored through full range of motion and entire depth of wound probed and visualized     Contaminated: no   Treatment:    Area cleansed with:  Betadine   Amount of cleaning:  Extensive   Irrigation solution:  Sterile saline   Irrigation method:  Pressure wash   Visualized foreign bodies/material removed: no   Skin repair:    Repair method:  Sutures   Suture size:  4-0   Suture material:  Nylon   Suture technique:  Vertical mattress   Number of sutures:  5 Post-procedure details:    Dressing:  Non-adherent dressing   Patient tolerance of procedure:  Tolerated well, no immediate complications        Margarita Mail, PA-C 03/18/16 0015    Orlie Dakin, MD 03/28/16 [phone]

## 2016-03-21 ENCOUNTER — Ambulatory Visit: Payer: Medicare Other | Admitting: Family Medicine

## 2016-03-21 DIAGNOSIS — S51852A Open bite of left forearm, initial encounter: Secondary | ICD-10-CM | POA: Diagnosis not present

## 2016-03-24 ENCOUNTER — Other Ambulatory Visit: Payer: Self-pay | Admitting: *Deleted

## 2016-03-24 MED ORDER — POLYETHYLENE GLYCOL 3350 17 GM/SCOOP PO POWD: 17.0000 g | Freq: Every day | ORAL | 12 refills | Status: DC

## 2016-03-24 NOTE — Telephone Encounter (Signed)
RF request for miralax LOV: 09/17/15 Next ov: 06/17/16 Last written: 11/04/14 #3162gm w/ 12RF

## 2016-03-25 ENCOUNTER — Encounter: Payer: Self-pay | Admitting: Family Medicine

## 2016-03-25 ENCOUNTER — Ambulatory Visit (INDEPENDENT_AMBULATORY_CARE_PROVIDER_SITE_OTHER): Payer: Medicare Other | Admitting: Family Medicine

## 2016-03-25 VITALS — BP 151/90 | HR 73 | Temp 98.0°F | Resp 16 | Ht 67.0 in | Wt 175.0 lb

## 2016-03-25 DIAGNOSIS — I10 Essential (primary) hypertension: Secondary | ICD-10-CM

## 2016-03-25 DIAGNOSIS — S41102D Unspecified open wound of left upper arm, subsequent encounter: Secondary | ICD-10-CM | POA: Diagnosis not present

## 2016-03-25 MED ORDER — AMLODIPINE BESYLATE 10 MG PO TABS: 10.0000 mg | ORAL_TABLET | Freq: Every day | ORAL | 3 refills | Status: DC

## 2016-03-25 NOTE — Progress Notes (Signed)
OFFICE VISIT  03/25/2016   CC:  Chief Complaint  Patient presents with  . Follow-up    HTN   HPI:    Patient is a 65 y.o. Caucasian female who presents for 6 week f/u HTN. Last visit her bp's were still intermittently up into stage I htn range so I increased her coreg to 25mg  bid. Also takes lisinopril 40mg  qd. HOme bp's consistently 140-150 syst over [id number] diastolic.  HR 70s-80s.    She got bit by a horse 1 week ago and went to ED: left forearm wound was sewn up and she was rx'd augmentin.  Tetanus is UTD.  Arm is healing well and we need to try to get her sutures out.  ROS: no fever, no malaise, no HAs, no vision c/o's, no CP or SOB.  Past Medical History:  Diagnosis Date  . Alcoholism in remission [facility])    states last alcohol 11 yrs ago  . Anxiety   . Bipolar 1 disorder [facility])    sees a psychiatrist  . Cervical spondylosis without myelopathy 12/19/2013   MRI 02/2014: multilevel DDD/spondylosis, not much change compared to prior MRI.  Pt not in favor of invasive therapy for her neck as of 04/2014.  Marland Kitchen Chronic renal insufficiency, stage II (mild)   . COPD (chronic obstructive pulmonary disease) ([place])   . Environmental allergies   . Fibromyalgia   . GERD (gastroesophageal reflux disease)   . Hiatal hernia   . History of adenomatous polyp of colon 04/2008; 08/2014   No high grade dysplasia: recall 5 yrs ([name], [name])  . History of basal cell carcinoma excision    NOSE  . History of Helicobacter pylori infection 04/2008   +gastric biopsy (gastritis but no metaplasia, dysplasia, or malignancy identified)  . History of TIA (transient ischemic attack)    secondary to HELLP syndrome 1994 post c/s--  residual memory loss  . Hyperlipidemia    no meds in > a decade per pt  . Hypertension   . Internal hemorrhoids   . Left ureteral calculus 08/2013   Perc nephr--cystoscopy w/ureteroscopy + laser for stone removal.  Residual asymptomatic left renal nephrolithiasis <75mm  present post-procedure.  . Memory loss    Abnl MRI brain and CT brain c/w chronic microvascular ischemia.  Repeat MRI brain 02/2014 stable (Dr. Mayra Reel with no plan to f/u with neuro as of 04/2014)  . Osteoporosis 05/2010   2012 'penia; 06/2015 'porosis:  repeat DEXA 2 yrs  . Shortness of breath   . Sprain of neck 12/19/2013  . TMJ (temporomandibular joint disorder)    USES  MOUTH GUARD  . Vitamin D deficiency 05/2011    Past Surgical History:  Procedure Laterality Date  . ANTERIOR CERVICAL DECOMP/DISCECTOMY FUSION  06-30-2009   C3 -- C5  AND EXPLORATION OF FUSION C5-7 W/  PLATE REMOVAL  . CERVICAL FUSION  2002   anterior C5 -- C7  . CERVICAL SPINE SURGERY  08-27-1999     C6 -- T1  LAMINECTOMY/  DISKECTOMY  . CESAREAN SECTION  1994  . COLONOSCOPY W/ POLYPECTOMY  04/2008;08/2014   2016 tubular adenoma x 1; +diverticulosis and int/ext hemorrhoids.  Recall 5 yrs  . CYSTOSCOPY WITH URETEROSCOPY AND STENT PLACEMENT Left 08/28/2013   Procedure: CYSTOSCOPY, RGP,  WITH URETEROSCOPY AND STENT REMOVAL;  Surgeon: [name], MD;  Location: [facility];  Service: Urology;  Laterality: Left;  . ESOPHAGOGASTRODUODENOSCOPY  2010; 08/2014   +Candidal esophagitis; Mild chronic gastritis  w/intestinal metaplasia--NEG H pylori, neg for eosinophilic esoph  . HOLMIUM LASER APPLICATION Left [id number]   Procedure: HOLMIUM LASER APPLICATION;  Surgeon: [name], MD;  Location: [facility];  Service: Urology;  Laterality: Left;  . NEGATIVE SLEEP STUDY  04-01-2007  . NEPHROLITHOTOMY Left 08/05/2013   Procedure: NEPHROLITHOTOMY PERCUTANEOUS;  Surgeon: [name], MD;  Location: [facility];  Service: Urology;  Laterality: Left;  . TONSILLECTOMY AND ADENOIDECTOMY  1975  . TOTAL KNEE ARTHROPLASTY Right 2003  . ULTRASOUND EXAM,PELVIC COMPLETE ([facility] HX)  06/25/15   NORMAL (done by GYN)    Outpatient Medications Prior to Visit  Medication Sig Dispense Refill  . ADVAIR DISKUS  250-50 MCG/DOSE AEPB INHALE 1 PUFF INTO THE LUNGS 2 (TWO) TIMES DAILY. 60 each 5  . albuterol (PROAIR HFA) 108 (90 Base) MCG/ACT inhaler INHALE 2 PUFFS INTO THE LUNGS EVERY 4 (FOUR) HOURS AS NEEDED FOR WHEEZING OR SHORTNESS OF BREATH. 8.5 each 1  . ALPRAZolam (XANAX) 1 MG tablet Take 1 mg by mouth 3 (three) times daily as needed.     . B Complex-C (B-COMPLEX WITH VITAMIN C) tablet Take 1 tablet by mouth daily.    . calcium & magnesium carbonates (MYLANTA) [id number] MG tablet Take 1 tablet by mouth daily.    . carvedilol (COREG) 25 MG tablet Take 1 tablet (25 mg total) by mouth 2 (two) times daily with a meal. 60 tablet 3  . cetirizine (ZYRTEC) 10 MG tablet Take 10 mg by mouth every morning.     . cyclobenzaprine (FLEXERIL) 10 MG tablet Take 10 mg by mouth 2 (two) times daily as needed.     . fluticasone (FLONASE) 50 MCG/ACT nasal spray Place 2 sprays into both nostrils daily. 16 g 12  . INCRUSE ELLIPTA 62.5 MCG/INH AEPB     . lamoTRIgine (LAMICTAL) 100 MG tablet Take 3 tablets (300 mg total) by mouth at bedtime. 90 tablet 11  . lisinopril (PRINIVIL,ZESTRIL) 40 MG tablet Take 1 tablet (40 mg total) by mouth daily. 30 tablet 11  . montelukast (SINGULAIR) 10 MG tablet Take 1 tablet (10 mg total) by mouth every evening. 30 tablet 12  . Multiple Vitamin (MULTIVITAMIN) tablet Take 1 tablet by mouth daily.    . naproxen (NAPROSYN) 500 MG tablet Take 1 tablet (500 mg total) by mouth 2 (two) times daily with a meal. 30 tablet 0  . Oxycodone HCl 10 MG TABS Take 1 tablet by mouth 3 (three) times daily.    . polyethylene glycol powder (GLYCOLAX/MIRALAX) powder Take 17 g by mouth daily. 3162 g 12  . ranitidine (ZANTAC) 150 MG capsule Take 150 mg by mouth 2 (two) times daily as needed.     . TURMERIC PO Take by mouth.    . venlafaxine XR (EFFEXOR-XR) 150 MG 24 hr capsule Take 1 capsule (150 mg total) by mouth daily with breakfast. 30 capsule 11  . zolpidem (AMBIEN) 10 MG tablet Take 10 mg by mouth at bedtime.     Marland Kitchen amoxicillin-clavulanate (AUGMENTIN) 875-125 MG tablet Take 1 tablet by mouth 2 (two) times daily. One po bid x 7 days (Patient not taking: Reported on 03/25/2016) 14 tablet 0   No facility-administered medications prior to visit.     Allergies  Allergen Reactions  . Ceftin [Cefuroxime Axetil] Anaphylaxis    Swelling of eyes and tongue  . Fosamax [Alendronate Sodium] Diarrhea  . Morphine And Related Other (See Comments)    Hallucinations/ disoriented  . Prednisone Other (  See Comments)    Hyperactivity    ROS As per HPI  PE: Blood pressure (!) 151/90, pulse 73, temperature 98 F (36.7 C), temperature source Oral, resp. rate 16, height 5\' 7"  (1.702 m), weight 175 lb (79.4 kg), SpO2 93 %. Gen: Alert, well appearing.  Patient is oriented to person, place, time, and situation. AFFECT: pleasant, lucid thought and speech. Left forearm: boomerang-shaped wound with edges not well approximated, large scab in middle, all sutures partially or fully obscured by the scab.  No surrounding erythema and no tenderness.  No wound exudate.  LABS:  none  IMPRESSION AND PLAN:  1) HTN, uncontrolled: continue current meds and add amlodipine 10mg  qd. Continue home bp monitoring.  2) Left arm bite wound, s/p suturing at the ED. Due to scab presence (burying the sutures) I was only able to remove 2 sutures today.  I was unable to remove the 3 sutures in the middle.  I'll have her return in a week and we'll see what we can do at that time.  An After Visit Summary was printed and given to the patient.   FOLLOW UP: No Follow-up on file.

## 2016-03-25 NOTE — Progress Notes (Signed)
Pre visit review using our clinic review tool, if applicable. No additional management support is needed unless otherwise documented below in the visit note. 

## 2016-03-29 ENCOUNTER — Other Ambulatory Visit: Payer: Self-pay | Admitting: *Deleted

## 2016-03-29 MED ORDER — MONTELUKAST SODIUM 10 MG PO TABS: 10.0000 mg | ORAL_TABLET | Freq: Every evening | ORAL | 12 refills | Status: DC

## 2016-03-29 NOTE — Telephone Encounter (Signed)
RF request for montelukast LOV: 03/25/16 Next ov: 04/01/16 Last written: 03/23/15 #30 w/ 12RF

## 2016-03-31 ENCOUNTER — Telehealth: Payer: Self-pay | Admitting: Family Medicine

## 2016-03-31 NOTE — Telephone Encounter (Signed)
Lopez Dr. [name] she is willing to see Erin Lopez but did request 30 min apt to giver her time to evaluate and remove stitches. 5 stiches was put in by ER and [name] removed 2 on 03/25/16 so only 3 needs to be removed. Erin Lopez Erin Lopez and scheduled her with Dr. [name] at 3:30pm tomorrow afternoon (04/01/16).

## 2016-03-31 NOTE — Telephone Encounter (Signed)
Patient needs her stitches taken out. There are no openings tomorrow.  Please advise.

## 2016-04-01 ENCOUNTER — Encounter: Payer: Self-pay | Admitting: Family Medicine

## 2016-04-01 ENCOUNTER — Ambulatory Visit (INDEPENDENT_AMBULATORY_CARE_PROVIDER_SITE_OTHER): Payer: Medicare Other | Admitting: Family Medicine

## 2016-04-01 ENCOUNTER — Ambulatory Visit: Payer: Medicare Other | Admitting: Family Medicine

## 2016-04-01 VITALS — BP 126/70 | HR 74 | Temp 97.6°F | Resp 16 | Wt 178.1 lb

## 2016-04-01 DIAGNOSIS — M150 Primary generalized (osteo)arthritis: Secondary | ICD-10-CM | POA: Diagnosis not present

## 2016-04-01 DIAGNOSIS — Z79891 Long term (current) use of opiate analgesic: Secondary | ICD-10-CM | POA: Diagnosis not present

## 2016-04-01 DIAGNOSIS — Z791 Long term (current) use of non-steroidal anti-inflammatories (NSAID): Secondary | ICD-10-CM | POA: Diagnosis not present

## 2016-04-01 DIAGNOSIS — T148XXA Other injury of unspecified body region, initial encounter: Secondary | ICD-10-CM | POA: Diagnosis not present

## 2016-04-01 DIAGNOSIS — M797 Fibromyalgia: Secondary | ICD-10-CM | POA: Diagnosis not present

## 2016-04-01 NOTE — Progress Notes (Signed)
Pre visit review using our clinic review tool, if applicable. No additional management support is needed unless otherwise documented below in the visit note. 

## 2016-04-01 NOTE — Patient Instructions (Signed)
Keep area clean and dry. Keep bag balm over and covered, until completely healed.    Monitor for redness, drainage, fever or other signs of infection.

## 2016-04-01 NOTE — Progress Notes (Signed)
Erin Lopez , [birth year], 65 y.o., female MRN: [id number] Patient Care Team    Relationship Specialty Notifications Start End  Tammi Sou, MD PCP - General Family Medicine  05/29/13    Comment: Merged (Merged)  Kathrynn Ducking, MD Consulting Physician Neurology  06/13/13   Erline Levine, MD Consulting Physician Neurosurgery  06/13/13   Allyn Kenner, MD Consulting Physician Dermatology  10/03/14   Reather Littler, MD Consulting Physician Rheumatology  09/17/15   [name], DMD  Dentistry  02/01/16   Wallene Huh, DPM Consulting Physician Podiatry  02/22/16     CC: stitch removal/animal bite wound check.  Subjective: Pt presents for an OV for wound check on horse bite and removal of residual stitches x3 that have been buried and unable to be removed last week. Pt denies redness, drainage or fever. She has finished abx and is UTD with td.   Allergies  Allergen Reactions  . Ceftin [Cefuroxime Axetil] Anaphylaxis    Swelling of eyes and tongue  . Fosamax [Alendronate Sodium] Diarrhea  . Morphine And Related Other (See Comments)    Hallucinations/ disoriented  . Prednisone Other (See Comments)    Hyperactivity   Social History  Substance Use Topics  . Smoking status: Former Smoker    Packs/day: 1.00    Years: 33.00    Types: Cigarettes    Quit date: 08/22/1993  . Smokeless tobacco: Never Used  . Alcohol use No     Comment: ALCOHOLIC IN REMISSION SINCE  2004   Past Medical History:  Diagnosis Date  . Alcoholism in remission [facility])    states last alcohol 11 yrs ago  . Anxiety   . Bipolar 1 disorder [facility])    sees a psychiatrist  . Cervical spondylosis without myelopathy 12/19/2013   MRI 02/2014: multilevel DDD/spondylosis, not much change compared to prior MRI.  Pt not in favor of invasive therapy for her neck as of 04/2014.  Marland Kitchen Chronic renal insufficiency, stage II (mild)   . COPD (chronic obstructive pulmonary disease) ([place])   . Environmental allergies   .  Fibromyalgia   . GERD (gastroesophageal reflux disease)   . Hiatal hernia   . History of adenomatous polyp of colon 04/2008; 08/2014   No high grade dysplasia: recall 5 yrs ([name], [name])  . History of basal cell carcinoma excision    NOSE  . History of Helicobacter pylori infection 04/2008   +gastric biopsy (gastritis but no metaplasia, dysplasia, or malignancy identified)  . History of TIA (transient ischemic attack)    secondary to HELLP syndrome 1994 post c/s--  residual memory loss  . Hyperlipidemia    no meds in > a decade per pt  . Hypertension   . Internal hemorrhoids   . Left ureteral calculus 08/2013   Perc nephr--cystoscopy w/ureteroscopy + laser for stone removal.  Residual asymptomatic left renal nephrolithiasis <64mm present post-procedure.  . Memory loss    Abnl MRI brain and CT brain c/w chronic microvascular ischemia.  Repeat MRI brain 02/2014 stable (Dr. Mayra Reel with no plan to f/u with neuro as of 04/2014)  . Osteoporosis 05/2010   2012 'penia; 06/2015 'porosis:  repeat DEXA 2 yrs  . Shortness of breath   . Sprain of neck 12/19/2013  . TMJ (temporomandibular joint disorder)    USES  MOUTH GUARD  . Vitamin D deficiency 05/2011   Past Surgical History:  Procedure Laterality Date  . ANTERIOR CERVICAL DECOMP/DISCECTOMY FUSION  06-30-2009  C3 -- C5  AND EXPLORATION OF FUSION C5-7 W/  PLATE REMOVAL  . CERVICAL FUSION  2002   anterior C5 -- C7  . CERVICAL SPINE SURGERY  08-27-1999     C6 -- T1  LAMINECTOMY/  DISKECTOMY  . CESAREAN SECTION  1994  . COLONOSCOPY W/ POLYPECTOMY  04/2008;08/2014   2016 tubular adenoma x 1; +diverticulosis and int/ext hemorrhoids.  Recall 5 yrs  . CYSTOSCOPY WITH URETEROSCOPY AND STENT PLACEMENT Left 08/28/2013   Procedure: CYSTOSCOPY, RGP,  WITH URETEROSCOPY AND STENT REMOVAL;  Surgeon: [name], MD;  Location: [facility];  Service: Urology;  Laterality: Left;  . ESOPHAGOGASTRODUODENOSCOPY  2010; 08/2014    +Candidal esophagitis; Mild chronic gastritis w/intestinal metaplasia--NEG H pylori, neg for eosinophilic esoph  . HOLMIUM LASER APPLICATION Left [id number]   Procedure: HOLMIUM LASER APPLICATION;  Surgeon: [name], MD;  Location: [facility];  Service: Urology;  Laterality: Left;  . NEGATIVE SLEEP STUDY  04-01-2007  . NEPHROLITHOTOMY Left 08/05/2013   Procedure: NEPHROLITHOTOMY PERCUTANEOUS;  Surgeon: [name], MD;  Location[facility];  Service: Urology;  Laterality: Left;  . TONSILLECTOMY AND ADENOIDECTOMY  1975  . TOTAL KNEE ARTHROPLASTY Right 2003  . ULTRASOUND EXAM,PELVIC COMPLETE ([facility] HX)  06/25/15   NORMAL (done by GYN)   Family History  Problem Relation Age of Onset  . Alcohol abuse Mother   . Arthritis Mother   . Hypertension Mother   . Hyperlipidemia Mother   . Diabetes Mother   . Parkinsonism Mother   . Breast cancer Mother   . Prostate cancer Father   . Hypertension Father   . Hyperlipidemia Father   . Diabetes Father   . Heart disease Father   . Esophageal cancer Sister   . Colon polyps Sister   . Colon polyps Brother   . Heart disease Brother     x 2  . Irritable bowel syndrome Sister    Allergies as of 04/01/2016      Reactions   Ceftin [cefuroxime Axetil] Anaphylaxis   Swelling of eyes and tongue   Fosamax [alendronate Sodium] Diarrhea   Morphine And Related Other (See Comments)   Hallucinations/ disoriented   Prednisone Other (See Comments)   Hyperactivity      Medication List       Accurate as of 04/01/16  3:58 PM. Always use your most recent med list.          ADVAIR DISKUS 250-50 MCG/DOSE Aepb Generic drug:  Fluticasone-Salmeterol INHALE 1 PUFF INTO THE LUNGS 2 (TWO) TIMES DAILY.   albuterol 108 (90 Base) MCG/ACT inhaler Commonly known as:  PROAIR HFA INHALE 2 PUFFS INTO THE LUNGS EVERY 4 (FOUR) HOURS AS NEEDED FOR WHEEZING OR SHORTNESS OF BREATH.   ALPRAZolam 1 MG tablet Commonly known as:  XANAX Take 1 mg by  mouth 3 (three) times daily as needed.   amLODipine 10 MG tablet Commonly known as:  NORVASC Take 1 tablet (10 mg total) by mouth daily.   B-complex with vitamin C tablet Take 1 tablet by mouth daily.   calcium & magnesium carbonates 311-232 MG tablet Commonly known as:  MYLANTA Take 1 tablet by mouth daily.   carvedilol 25 MG tablet Commonly known as:  COREG Take 1 tablet (25 mg total) by mouth 2 (two) times daily with a meal.   cetirizine 10 MG tablet Commonly known as:  ZYRTEC Take 10 mg by mouth every morning.   cyclobenzaprine 10 MG tablet  Commonly known as:  FLEXERIL Take 10 mg by mouth 2 (two) times daily as needed.   doxycycline 50 MG capsule Commonly known as:  VIBRAMYCIN TAKE 1 CAP BY MOUTH UP TO TWICE A DAY AS NEEDED WITH FOOD OR WATER, CAREFUL WITH SUN EXPOSURE   fluticasone 0.05 % cream Commonly known as:  CUTIVATE   fluticasone 50 MCG/ACT nasal spray Commonly known as:  FLONASE Place 2 sprays into both nostrils daily.   INCRUSE ELLIPTA 62.5 MCG/INH Aepb Generic drug:  umeclidinium bromide   ketoconazole 2 % cream Commonly known as:  NIZORAL   lamoTRIgine 100 MG tablet Commonly known as:  LAMICTAL Take 3 tablets (300 mg total) by mouth at bedtime.   lisinopril 40 MG tablet Commonly known as:  PRINIVIL,ZESTRIL Take 1 tablet (40 mg total) by mouth daily.   montelukast 10 MG tablet Commonly known as:  SINGULAIR Take 1 tablet (10 mg total) by mouth every evening.   multivitamin tablet Take 1 tablet by mouth daily.   naproxen 500 MG tablet Commonly known as:  NAPROSYN Take 1 tablet (500 mg total) by mouth 2 (two) times daily with a meal.   Oxycodone HCl 10 MG Tabs Take 1 tablet by mouth 3 (three) times daily.   polyethylene glycol powder powder Commonly known as:  GLYCOLAX/MIRALAX Take 17 g by mouth daily.   ranitidine 150 MG capsule Commonly known as:  ZANTAC Take 150 mg by mouth 2 (two) times daily as needed.   TURMERIC PO Take by  mouth.   venlafaxine XR 150 MG 24 hr capsule Commonly known as:  EFFEXOR-XR Take 1 capsule (150 mg total) by mouth daily with breakfast.   zolpidem 10 MG tablet Commonly known as:  AMBIEN Take 10 mg by mouth at bedtime.       No results found for this or any previous visit (from the past 24 hour(s)). No results found.   ROS: Negative, with the exception of above mentioned in HPI   Objective:  BP 126/70 (BP Location: Left Arm, Patient Position: Sitting, Cuff Size: Normal)   Pulse 74   Temp 97.6 F (36.4 C) (Oral)   Resp 16   Wt 178 lb 1.9 oz (80.8 kg)   SpO2 94%   BMI 27.90 kg/m  Body mass index is 27.9 kg/m. Gen: Afebrile. No acute distress. Nontoxic in appearance, well developed, well nourished.  Skin: no erythema or drainage. Horseshoe shaped granulating horse bite wound. X3 nylon sutures identified buried deep in thickly adhered scab.  Assessment/Plan: Erin Lopez is a 65 y.o. female present for acute OV for  Bite by animal - x3 residual sutures removed today after soaking with saline soaked guaze for 15 minutes. Small area of debridement needed surrounding sutures after it had soaked. Wound  will need to heal via secondary granulation.  - Pt  tolerated well.  - she was instructed to keep arm covered with coban, Telfa and buttered in bag balm until completely healed. This will likely take a few weeks to heal completely. Monitor for any signs of infection and f/u PRN    electronically signed by:  Howard Pouch, DO  [place]

## 2016-04-08 ENCOUNTER — Telehealth: Payer: Self-pay | Admitting: Family Medicine

## 2016-04-08 ENCOUNTER — Encounter: Payer: Self-pay | Admitting: Family Medicine

## 2016-04-08 NOTE — Telephone Encounter (Signed)
Contacted patient to remind her that she is overdue for her Prolia injection.  Last Injection was 09/17/15.  Patient states she isn't sure she wants to continue getting prolia.  She states she is going to discuss with her OB and she will call us back.

## 2016-04-08 NOTE — Telephone Encounter (Signed)
Noted  

## 2016-04-13 ENCOUNTER — Ambulatory Visit (INDEPENDENT_AMBULATORY_CARE_PROVIDER_SITE_OTHER): Payer: Medicare Other | Admitting: Podiatry

## 2016-04-13 ENCOUNTER — Encounter: Payer: Self-pay | Admitting: Podiatry

## 2016-04-13 DIAGNOSIS — M7750 Other enthesopathy of unspecified foot: Secondary | ICD-10-CM | POA: Diagnosis not present

## 2016-04-13 DIAGNOSIS — M21619 Bunion of unspecified foot: Secondary | ICD-10-CM | POA: Diagnosis not present

## 2016-04-13 DIAGNOSIS — M2042 Other hammer toe(s) (acquired), left foot: Secondary | ICD-10-CM

## 2016-04-13 DIAGNOSIS — M779 Enthesopathy, unspecified: Secondary | ICD-10-CM

## 2016-04-13 NOTE — Progress Notes (Signed)
Subjective:     Patient ID: Erin Lopez, female   DOB: Aug 13, 1950, 66 y.o.   MRN: [id number]  HPI patient presents with painful left foot admitting that she has not followed the advice that we had recommended and he gets very sore when she walks and she needs to have it fixed   Review of Systems  All other systems reviewed and are negative.      Objective:   Physical Exam Neurovascular status found to be intact muscle strength adequate range of motion within normal limits with patient found to have exquisite discomfort second metatarsophalangeal joint left large structural bunion deformity left with deviation the hallux and distal keratotic lesion third digit left that's painful. 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 Due to long-standing nature I recommended Austin-type osteotomy along with cleaning of the second MPJ with probable joint implant and also distal hammertoe. May require Akin osteotomy depending on position of the big toe and I did explain we will not be able to get complete correction of the bunion deformity but I do believe that it we will give her a good long-term result. I allowed her to read a consent form going over alternative treatments complications and patient wants surgery signed consent form after extensive review understanding recovery can take 6 months and there is no long-term guarantees. Dispensed air fracture walker with all instructions on usage and I want her to wear it prior to surgery to get used to it prior to the procedure

## 2016-04-13 NOTE — Patient Instructions (Signed)

## 2016-04-14 ENCOUNTER — Telehealth: Payer: Self-pay | Admitting: *Deleted

## 2016-04-14 NOTE — Telephone Encounter (Signed)
I got a message that you wanted me to call you about scheduling surgery.  "Yes, I guess I will do it on February 20.  What time will I have to ge there?"  You will probably have to be there at 6 am.  "That is too early, I can't find anyone to take me that early.  Can't he do it any later?"  No, he has Dr. Amalia Hailey assisting him and he can't do it any later.  "Why does he need someone to assist him?"  I guess he needs another hand  I see he's putting a implant in your 2nd toe, he may need help with that procedure.  I will see what I can do.  Can I call you back tomorrow if I have any problems?"  Yes, you can call me at anytime.

## 2016-04-19 ENCOUNTER — Telehealth: Payer: Self-pay | Admitting: *Deleted

## 2016-04-19 NOTE — Telephone Encounter (Signed)
"  I'm calling to schedule my surgery.  I spoke to my daughter.  I would like to do it for February 20."  They already have you in the book.  I will get it scheduled.

## 2016-04-22 ENCOUNTER — Encounter: Payer: Self-pay | Admitting: Family Medicine

## 2016-04-22 ENCOUNTER — Ambulatory Visit (INDEPENDENT_AMBULATORY_CARE_PROVIDER_SITE_OTHER): Payer: Medicare Other | Admitting: Family Medicine

## 2016-04-22 VITALS — BP 146/69 | HR 76 | Temp 98.0°F | Resp 16 | Ht 67.0 in | Wt 177.0 lb

## 2016-04-22 DIAGNOSIS — I10 Essential (primary) hypertension: Secondary | ICD-10-CM

## 2016-04-22 MED ORDER — CHLORTHALIDONE 25 MG PO TABS: 25.0000 mg | ORAL_TABLET | Freq: Every day | ORAL | 1 refills | Status: DC

## 2016-04-22 NOTE — Progress Notes (Signed)
Pre visit review using our clinic review tool, if applicable. No additional management support is needed unless otherwise documented below in the visit note. 

## 2016-04-22 NOTE — Progress Notes (Signed)
OFFICE VISIT  04/22/2016   CC:  Chief Complaint  Patient presents with  . Follow-up    HTN    HPI:    Patient is a 66 y.o. Caucasian female who presents for 1 mo f/u uncontrolled HTN. Last visit I added amlodipine 10mg  qd.  BP has been normal at home (110-130) BUT she has been noting redness/flushing of face every day for at least a couple of hours since starting amlodipine.  No palpitations, CP, SOB, or swelling of tongue/throat/eyes.  Past Medical History:  Diagnosis Date  . Alcoholism in remission [facility])    states last alcohol 11 yrs ago  . Anxiety   . Bipolar 1 disorder [facility])    sees a psychiatrist  . Cervical spondylosis without myelopathy 12/19/2013   MRI 02/2014: multilevel DDD/spondylosis, not much change compared to prior MRI.  Pt not in favor of invasive therapy for her neck as of 04/2014.  Marland Kitchen Chronic renal insufficiency, stage II (mild)   . COPD (chronic obstructive pulmonary disease) ([place])   . Environmental allergies   . Fibromyalgia   . GERD (gastroesophageal reflux disease)   . Hiatal hernia   . History of adenomatous polyp of colon 04/2008; 08/2014   No high grade dysplasia: recall 5 yrs ([name], [name])  . History of basal cell carcinoma excision    NOSE  . History of Helicobacter pylori infection 04/2008   +gastric biopsy (gastritis but no metaplasia, dysplasia, or malignancy identified)  . History of TIA (transient ischemic attack)    secondary to HELLP syndrome 1994 post c/s--  residual memory loss  . Hyperlipidemia    no meds in > a decade per pt  . Hypertension   . Internal hemorrhoids   . Left foot pain    Hallux deformity+adhesive capsulitis+ hammertoe:  [name] to do surgery as of 04/13/16.  Marland Kitchen Left ureteral calculus 08/2013   Perc nephr--cystoscopy w/ureteroscopy + laser for stone removal.  Residual asymptomatic left renal nephrolithiasis <45mm present post-procedure.  . Memory loss    Abnl MRI brain and CT brain c/w chronic microvascular  ischemia.  Repeat MRI brain 02/2014 stable (Dr. Mayra Reel with no plan to f/u with neuro as of 04/2014)  . Osteoporosis 05/2010   2012 'penia; 06/2015 'porosis:  prolia.  repeat DEXA 2 yrs.  As of 04/2016 pt going to discuss w/GYN whether or not to continue prolia.  . Shortness of breath   . Sprain of neck 12/19/2013  . TMJ (temporomandibular joint disorder)    USES  MOUTH GUARD  . Vitamin D deficiency 05/2011    Past Surgical History:  Procedure Laterality Date  . ANTERIOR CERVICAL DECOMP/DISCECTOMY FUSION  06-30-2009   C3 -- C5  AND EXPLORATION OF FUSION C5-7 W/  PLATE REMOVAL  . CERVICAL FUSION  2002   anterior C5 -- C7  . CERVICAL SPINE SURGERY  08-27-1999     C6 -- T1  LAMINECTOMY/  DISKECTOMY  . CESAREAN SECTION  1994  . COLONOSCOPY W/ POLYPECTOMY  04/2008;08/2014   2016 tubular adenoma x 1; +diverticulosis and int/ext hemorrhoids.  Recall 5 yrs  . CYSTOSCOPY WITH URETEROSCOPY AND STENT PLACEMENT Left 08/28/2013   Procedure: CYSTOSCOPY, RGP,  WITH URETEROSCOPY AND STENT REMOVAL;  Surgeon: [name], MD;  Location[facility];  Service: Urology;  Laterality: Left;  . ESOPHAGOGASTRODUODENOSCOPY  2010; 08/2014   +Candidal esophagitis; Mild chronic gastritis w/intestinal metaplasia--NEG H pylori, neg for eosinophilic esoph  . HOLMIUM LASER APPLICATION Left  08/28/2013   Procedure: HOLMIUM LASER APPLICATION;  Surgeon: [name], MD;  Location[facility];  Service: Urology;  Laterality: Left;  . NEGATIVE SLEEP STUDY  04-01-2007  . NEPHROLITHOTOMY Left 08/05/2013   Procedure: NEPHROLITHOTOMY PERCUTANEOUS;  Surgeon: [name], MD;  Location: [facility];  Service: Urology;  Laterality: Left;  . TONSILLECTOMY AND ADENOIDECTOMY  1975  . TOTAL KNEE ARTHROPLASTY Right 2003  . ULTRASOUND EXAM,PELVIC COMPLETE ([facility] HX)  06/25/15   NORMAL (done by GYN)    Outpatient Medications Prior to Visit  Medication Sig Dispense Refill  . ADVAIR DISKUS 250-50  MCG/DOSE AEPB INHALE 1 PUFF INTO THE LUNGS 2 (TWO) TIMES DAILY. 60 each 5  . albuterol (PROAIR HFA) 108 (90 Base) MCG/ACT inhaler INHALE 2 PUFFS INTO THE LUNGS EVERY 4 (FOUR) HOURS AS NEEDED FOR WHEEZING OR SHORTNESS OF BREATH. 8.5 each 1  . ALPRAZolam (XANAX) 1 MG tablet Take 1 mg by mouth 3 (three) times daily as needed.     . B Complex-C (B-COMPLEX WITH VITAMIN C) tablet Take 1 tablet by mouth daily.    . calcium & magnesium carbonates (MYLANTA) [id number] MG tablet Take 1 tablet by mouth daily.    . carvedilol (COREG) 25 MG tablet Take 1 tablet (25 mg total) by mouth 2 (two) times daily with a meal. 60 tablet 3  . cetirizine (ZYRTEC) 10 MG tablet Take 10 mg by mouth every morning.     . cyclobenzaprine (FLEXERIL) 10 MG tablet Take 10 mg by mouth 2 (two) times daily as needed.     . fluticasone (CUTIVATE) 0.05 % cream     . fluticasone (FLONASE) 50 MCG/ACT nasal spray Place 2 sprays into both nostrils daily. 16 g 12  . INCRUSE ELLIPTA 62.5 MCG/INH AEPB     . ketoconazole (NIZORAL) 2 % cream     . lamoTRIgine (LAMICTAL) 100 MG tablet Take 3 tablets (300 mg total) by mouth at bedtime. 90 tablet 11  . lisinopril (PRINIVIL,ZESTRIL) 40 MG tablet Take 1 tablet (40 mg total) by mouth daily. 30 tablet 11  . montelukast (SINGULAIR) 10 MG tablet Take 1 tablet (10 mg total) by mouth every evening. 30 tablet 12  . Multiple Vitamin (MULTIVITAMIN) tablet Take 1 tablet by mouth daily.    . naproxen (NAPROSYN) 500 MG tablet Take 1 tablet (500 mg total) by mouth 2 (two) times daily with a meal. 30 tablet 0  . Oxycodone HCl 10 MG TABS Take 1 tablet by mouth 3 (three) times daily.    . polyethylene glycol powder (GLYCOLAX/MIRALAX) powder Take 17 g by mouth daily. 3162 g 12  . ranitidine (ZANTAC) 150 MG capsule Take 150 mg by mouth 2 (two) times daily as needed.     . TURMERIC PO Take by mouth.    . venlafaxine XR (EFFEXOR-XR) 150 MG 24 hr capsule Take 1 capsule (150 mg total) by mouth daily with breakfast. 30  capsule 11  . zolpidem (AMBIEN) 10 MG tablet Take 10 mg by mouth at bedtime.    Marland Kitchen amLODipine (NORVASC) 10 MG tablet Take 1 tablet (10 mg total) by mouth daily. 30 tablet 3  . doxycycline (VIBRAMYCIN) 50 MG capsule TAKE 1 CAP BY MOUTH UP TO TWICE A DAY AS NEEDED WITH FOOD OR WATER, CAREFUL WITH SUN EXPOSURE  3   No facility-administered medications prior to visit.     Allergies  Allergen Reactions  . Ceftin [Cefuroxime Axetil] Anaphylaxis    Swelling of eyes and tongue  .  Fosamax [Alendronate Sodium] Diarrhea  . Morphine And Related Other (See Comments)    Hallucinations/ disoriented  . Prednisone Other (See Comments)    Hyperactivity    ROS As per HPI  PE: Blood pressure (!) 146/69, pulse 76, temperature 98 F (36.7 C), temperature source Oral, resp. rate 16, height 5\' 7"  (1.702 m), weight 177 lb (80.3 kg), SpO2 93 %. Gen: Alert, well appearing.  Patient is oriented to person, place, time, and situation. AFFECT: pleasant, lucid thought and speech. CV: RRR, no m/r/g.   LUNGS: CTA bilat, nonlabored resps, good aeration in all lung fields. EXT: no clubbing, cyanosis, or edema.  Face: no erythema, swelling, or rash.  LABS:    Chemistry      Component Value Date/Time   NA 139 06/11/2015 1221   K 4.6 06/11/2015 1221   CL 101 06/11/2015 1221   CO2 32 06/11/2015 1221   BUN 12 06/11/2015 1221   CREATININE 0.94 06/11/2015 1221      Component Value Date/Time   CALCIUM 9.4 06/11/2015 1221   ALKPHOS 51 06/11/2015 1221   AST 14 06/11/2015 1221   ALT 11 06/11/2015 1221   BILITOT 0.6 06/11/2015 1221     IMPRESSION AND PLAN:  Uncontrolled HTN: we got her control good with amlodipine but this caused facial flushing. Will d/c this med and start trial of chlorthalidone 25mg  qd.  Therapeutic expectations and side effect profile of medication discussed today.  Patient's questions answered.  An After Visit Summary was printed and given to the patient.  FOLLOW UP: Return in about 2  weeks (around 05/06/2016) for f/u HTN/get BMET.  Signed:  Crissie Sickles, MD           04/22/2016

## 2016-04-25 ENCOUNTER — Other Ambulatory Visit: Payer: Self-pay | Admitting: Pulmonary Disease

## 2016-04-25 ENCOUNTER — Telehealth: Payer: Self-pay | Admitting: *Deleted

## 2016-04-25 NOTE — Telephone Encounter (Signed)
"  My daughter couldn't get off work on February 20, so I need to move my surgery to February 13."  Okay, I will get it rescheduled to February 13.    I called and rescheduled surgery with [name] at [facility].  "Did you get my message.  My daughter is something else.  I need to move my surgery back to February 20."  Are you sure?  You called earlier and said she couldn't get off work.  "Yes, I am sure.  We will do it on February 20."

## 2016-04-29 DIAGNOSIS — M150 Primary generalized (osteo)arthritis: Secondary | ICD-10-CM | POA: Diagnosis not present

## 2016-04-29 DIAGNOSIS — Z79891 Long term (current) use of opiate analgesic: Secondary | ICD-10-CM | POA: Diagnosis not present

## 2016-04-29 DIAGNOSIS — M797 Fibromyalgia: Secondary | ICD-10-CM | POA: Diagnosis not present

## 2016-04-29 DIAGNOSIS — Z791 Long term (current) use of non-steroidal anti-inflammatories (NSAID): Secondary | ICD-10-CM | POA: Diagnosis not present

## 2016-04-29 NOTE — Telephone Encounter (Signed)
Patient has decided not to do prolia.

## 2016-05-06 ENCOUNTER — Telehealth: Payer: Self-pay | Admitting: Family Medicine

## 2016-05-06 NOTE — Telephone Encounter (Signed)
Patient came in for her appointment on Friday (05/06/16) not realizing that the appointment had not been made at last visit. She scheduled for next available appointment 05/16/16. She mentioned her bp has been running very low and she has felt very tired. She would like to know if she can cut her bp pill in half. Also can she make a lab appointment next week so the results will be available at her visit? THanks

## 2016-05-08 NOTE — Telephone Encounter (Signed)
OK to cut bp pill in half. About the labs, pls just have her plan on getting labs the day of her visit, that way I'll know better exactly what to order after seeing her and talking to her that day.-thx

## 2016-05-09 NOTE — Telephone Encounter (Signed)
Left message on voicemail for patient to return call. 

## 2016-05-09 NOTE — Telephone Encounter (Signed)
Patient notified and will cut the Lisinopril in half. Patient verbalized understanding.

## 2016-05-16 ENCOUNTER — Encounter: Payer: Self-pay | Admitting: Family Medicine

## 2016-05-16 ENCOUNTER — Ambulatory Visit (INDEPENDENT_AMBULATORY_CARE_PROVIDER_SITE_OTHER): Payer: Medicare Other | Admitting: Family Medicine

## 2016-05-16 VITALS — BP 101/65 | HR 67 | Temp 98.1°F | Resp 16 | Ht 67.0 in | Wt 179.0 lb

## 2016-05-16 DIAGNOSIS — I10 Essential (primary) hypertension: Secondary | ICD-10-CM

## 2016-05-16 LAB — BASIC METABOLIC PANEL
BUN: 18 mg/dL (ref 7–25)
CO2: 33 mmol/L — ABNORMAL HIGH (ref 20–31)
Calcium: 9.7 mg/dL (ref 8.6–10.4)
Chloride: 100 mmol/L (ref 98–110)
Creat: 1.27 mg/dL — ABNORMAL HIGH (ref 0.50–0.99)
Glucose, Bld: 89 mg/dL (ref 65–99)
POTASSIUM: 3.9 mmol/L (ref 3.5–5.3)
SODIUM: 139 mmol/L (ref 135–146)

## 2016-05-16 NOTE — Progress Notes (Signed)
Pre visit review using our clinic review tool, if applicable. No additional management support is needed unless otherwise documented below in the visit note. 

## 2016-05-16 NOTE — Progress Notes (Signed)
OFFICE VISIT  05/16/2016   CC:  Chief Complaint  Patient presents with  . Follow-up    HTN,    HPI:    Patient is a 66 y.o. Caucasian female who presents for 3 week f/u HTN. Last o/v started chlorthalidone 25mg  qd.  Home bp monitoring: normal since getting on this med. No signif side effects.  Past Medical History:  Diagnosis Date  . Alcoholism in remission [facility])    states last alcohol 11 yrs ago  . Anxiety   . Bipolar 1 disorder [facility])    sees a psychiatrist  . Cervical spondylosis without myelopathy 12/19/2013   MRI 02/2014: multilevel DDD/spondylosis, not much change compared to prior MRI.  Pt not in favor of invasive therapy for her neck as of 04/2014.  Marland Kitchen Chronic renal insufficiency, stage II (mild)   . COPD (chronic obstructive pulmonary disease) ([place])   . Environmental allergies   . Fibromyalgia   . GERD (gastroesophageal reflux disease)   . Hiatal hernia   . History of adenomatous polyp of colon 04/2008; 08/2014   No high grade dysplasia: recall 5 yrs ([name], [name])  . History of basal cell carcinoma excision    NOSE  . History of Helicobacter pylori infection 04/2008   +gastric biopsy (gastritis but no metaplasia, dysplasia, or malignancy identified)  . History of TIA (transient ischemic attack)    secondary to HELLP syndrome 1994 post c/s--  residual memory loss  . Hyperlipidemia    no meds in > a decade per pt  . Hypertension   . Internal hemorrhoids   . Left foot pain    Hallux deformity+adhesive capsulitis+ hammertoe:  [name] recommended surgery but pt declines as of 05/2016.  Marland Kitchen Left ureteral calculus 08/2013   Perc nephr--cystoscopy w/ureteroscopy + laser for stone removal.  Residual asymptomatic left renal nephrolithiasis <44mm present post-procedure.  . Memory loss    Abnl MRI brain and CT brain c/w chronic microvascular ischemia.  Repeat MRI brain 02/2014 stable (Dr. Mayra Reel with no plan to f/u with neuro as of 04/2014)  . Osteoporosis  05/2010   2012 'penia; 06/2015 'porosis:  prolia.  repeat DEXA 2 yrs.  As of 04/2016 pt going to discuss w/GYN whether or not to continue prolia.  . Shortness of breath   . Sprain of neck 12/19/2013  . TMJ (temporomandibular joint disorder)    USES  MOUTH GUARD  . Vitamin D deficiency 05/2011    Past Surgical History:  Procedure Laterality Date  . ANTERIOR CERVICAL DECOMP/DISCECTOMY FUSION  06-30-2009   C3 -- C5  AND EXPLORATION OF FUSION C5-7 W/  PLATE REMOVAL  . CERVICAL FUSION  2002   anterior C5 -- C7  . CERVICAL SPINE SURGERY  08-27-1999     C6 -- T1  LAMINECTOMY/  DISKECTOMY  . CESAREAN SECTION  1994  . COLONOSCOPY W/ POLYPECTOMY  04/2008;08/2014   2016 tubular adenoma x 1; +diverticulosis and int/ext hemorrhoids.  Recall 5 yrs  . CYSTOSCOPY WITH URETEROSCOPY AND STENT PLACEMENT Left 08/28/2013   Procedure: CYSTOSCOPY, RGP,  WITH URETEROSCOPY AND STENT REMOVAL;  Surgeon: [name], MD;  Location: [facility];  Service: Urology;  Laterality: Left;  . ESOPHAGOGASTRODUODENOSCOPY  2010; 08/2014   +Candidal esophagitis; Mild chronic gastritis w/intestinal metaplasia--NEG H pylori, neg for eosinophilic esoph  . HOLMIUM LASER APPLICATION Left [id number]   Procedure: HOLMIUM LASER APPLICATION;  Surgeon: [name], MD;  Location: [facility];  Service: Urology;  Laterality: Left;  . NEGATIVE SLEEP STUDY  04-01-2007  . NEPHROLITHOTOMY Left 08/05/2013   Procedure: NEPHROLITHOTOMY PERCUTANEOUS;  Surgeon: [name], MD;  Location: [facility];  Service: Urology;  Laterality: Left;  . TONSILLECTOMY AND ADENOIDECTOMY  1975  . TOTAL KNEE ARTHROPLASTY Right 2003  . ULTRASOUND EXAM,PELVIC COMPLETE ([facility] HX)  06/25/15   NORMAL (done by GYN)    Outpatient Medications Prior to Visit  Medication Sig Dispense Refill  . ADVAIR DISKUS 250-50 MCG/DOSE AEPB INHALE 1 PUFF INTO THE LUNGS 2 (TWO) TIMES DAILY. 60 each 5  . albuterol (PROAIR HFA) 108 (90 Base) MCG/ACT inhaler  INHALE 2 PUFFS INTO THE LUNGS EVERY 4 (FOUR) HOURS AS NEEDED FOR WHEEZING OR SHORTNESS OF BREATH. 8.5 each 1  . ALPRAZolam (XANAX) 1 MG tablet Take 1 mg by mouth 3 (three) times daily as needed.     . B Complex-C (B-COMPLEX WITH VITAMIN C) tablet Take 1 tablet by mouth daily.    . calcium & magnesium carbonates (MYLANTA) [id number] MG tablet Take 1 tablet by mouth daily.    . carvedilol (COREG) 25 MG tablet Take 1 tablet (25 mg total) by mouth 2 (two) times daily with a meal. 60 tablet 3  . cetirizine (ZYRTEC) 10 MG tablet Take 10 mg by mouth every morning.     . chlorthalidone (HYGROTON) 25 MG tablet Take 1 tablet (25 mg total) by mouth daily. 30 tablet 1  . cyclobenzaprine (FLEXERIL) 10 MG tablet Take 10 mg by mouth 2 (two) times daily as needed.     . fluticasone (CUTIVATE) 0.05 % cream     . fluticasone (FLONASE) 50 MCG/ACT nasal spray Place 2 sprays into both nostrils daily. 16 g 12  . INCRUSE ELLIPTA 62.5 MCG/INH AEPB INHALE 1 PUFF INTO THE LUNGS DAILY. 30 each 1  . ketoconazole (NIZORAL) 2 % cream     . lamoTRIgine (LAMICTAL) 100 MG tablet Take 3 tablets (300 mg total) by mouth at bedtime. 90 tablet 11  . lisinopril (PRINIVIL,ZESTRIL) 40 MG tablet Take 1 tablet (40 mg total) by mouth daily. 30 tablet 11  . montelukast (SINGULAIR) 10 MG tablet Take 1 tablet (10 mg total) by mouth every evening. 30 tablet 12  . Multiple Vitamin (MULTIVITAMIN) tablet Take 1 tablet by mouth daily.    . naproxen (NAPROSYN) 500 MG tablet Take 1 tablet (500 mg total) by mouth 2 (two) times daily with a meal. 30 tablet 0  . Oxycodone HCl 10 MG TABS Take 1 tablet by mouth 3 (three) times daily.    . polyethylene glycol powder (GLYCOLAX/MIRALAX) powder Take 17 g by mouth daily. 3162 g 12  . ranitidine (ZANTAC) 150 MG capsule Take 150 mg by mouth 2 (two) times daily as needed.     . TURMERIC PO Take by mouth.    . venlafaxine XR (EFFEXOR-XR) 150 MG 24 hr capsule Take 1 capsule (150 mg total) by mouth daily with  breakfast. 30 capsule 11  . zolpidem (AMBIEN) 10 MG tablet Take 10 mg by mouth at bedtime.     No facility-administered medications prior to visit.     Allergies  Allergen Reactions  . Ceftin [Cefuroxime Axetil] Anaphylaxis    Swelling of eyes and tongue  . Fosamax [Alendronate Sodium] Diarrhea  . Morphine And Related Other (See Comments)    Hallucinations/ disoriented  . Prednisone Other (See Comments)    Hyperactivity    ROS As per HPI  PE: Blood pressure 101/65, pulse 67, temperature 98.1  F (36.7 C), temperature source Oral, resp. rate 16, height 5\' 7"  (1.702 m), weight 179 lb (81.2 kg), SpO2 97 %. Gen: Alert, well appearing.  Patient is oriented to person, place, time, and situation. AFFECT: pleasant, lucid thought and speech. CV: RRR, no m/r/g.   LUNGS: CTA bilat, nonlabored resps, good aeration in all lung fields.   LABS:    Chemistry      Component Value Date/Time   NA 139 06/11/2015 1221   K 4.6 06/11/2015 1221   CL 101 06/11/2015 1221   CO2 32 06/11/2015 1221   BUN 12 06/11/2015 1221   CREATININE 0.94 06/11/2015 1221      Component Value Date/Time   CALCIUM 9.4 06/11/2015 1221   ALKPHOS 51 06/11/2015 1221   AST 14 06/11/2015 1221   ALT 11 06/11/2015 1221   BILITOT 0.6 06/11/2015 1221     IMPRESSION AND PLAN:  HTN, well controlled now. The current medical regimen is effective;  continue present plan and medications. Check BMET today.  An After Visit Summary was printed and given to the patient.  FOLLOW UP: Return in about 4 months (around 09/13/2016) for annual CPE (fasting).  Signed:  Crissie Sickles, MD           05/16/2016

## 2016-05-17 ENCOUNTER — Telehealth: Payer: Self-pay | Admitting: *Deleted

## 2016-05-17 ENCOUNTER — Other Ambulatory Visit: Payer: Self-pay | Admitting: *Deleted

## 2016-05-17 DIAGNOSIS — R7989 Other specified abnormal findings of blood chemistry: Secondary | ICD-10-CM

## 2016-05-17 NOTE — Telephone Encounter (Signed)
"  I am not going to be able to have the surgery.  This is what I am calling about."

## 2016-05-18 ENCOUNTER — Telehealth: Payer: Self-pay | Admitting: Family Medicine

## 2016-05-18 NOTE — Telephone Encounter (Signed)
SW pt she was concerned about her kidney function. I advised her that sometimes the levels go up then go back down to normal that is why we are going to recheck it in two weeks. She asked if she should see a specialist. I advised her that if her test in two weeks comes back elevated we will then conceder sending her to a nephrologist. Pt was insistent that I speak to [name]. I spoke with [name] and he agreed with what I had told pt. Pt then voiced understanding.

## 2016-05-18 NOTE — Telephone Encounter (Signed)
Patient calling about a diagnosis after her lab results from last week.  Thank you,  -LL

## 2016-05-20 NOTE — Telephone Encounter (Signed)
I spoke with patient and informed her I would cancel her surgery.  She stated she would call later to reschedule.

## 2016-05-30 ENCOUNTER — Other Ambulatory Visit: Payer: Self-pay | Admitting: Family Medicine

## 2016-05-30 ENCOUNTER — Other Ambulatory Visit: Payer: Medicare Other

## 2016-05-31 ENCOUNTER — Other Ambulatory Visit: Payer: Medicare Other

## 2016-06-01 ENCOUNTER — Other Ambulatory Visit (INDEPENDENT_AMBULATORY_CARE_PROVIDER_SITE_OTHER): Payer: Medicare Other

## 2016-06-01 DIAGNOSIS — R7989 Other specified abnormal findings of blood chemistry: Secondary | ICD-10-CM

## 2016-06-02 DIAGNOSIS — R7303 Prediabetes: Secondary | ICD-10-CM

## 2016-06-02 HISTORY — DX: Prediabetes: R73.03

## 2016-06-02 LAB — BASIC METABOLIC PANEL
BUN: 12 mg/dL (ref 6–23)
CO2: 32 mEq/L (ref 19–32)
Calcium: 9.3 mg/dL (ref 8.4–10.5)
Chloride: 90 mEq/L — ABNORMAL LOW (ref 96–112)
Creatinine, Ser: 1.02 mg/dL (ref 0.40–1.20)
GFR: 58.09 mL/min — AB (ref >60.00)
GLUCOSE: 83 mg/dL (ref 70–99)
POTASSIUM: 3.4 meq/L — AB (ref 3.5–5.1)
SODIUM: 126 meq/L — AB (ref 135–145)

## 2016-06-10 DIAGNOSIS — M797 Fibromyalgia: Secondary | ICD-10-CM | POA: Diagnosis not present

## 2016-06-10 DIAGNOSIS — Z791 Long term (current) use of non-steroidal anti-inflammatories (NSAID): Secondary | ICD-10-CM | POA: Diagnosis not present

## 2016-06-10 DIAGNOSIS — Z79891 Long term (current) use of opiate analgesic: Secondary | ICD-10-CM | POA: Diagnosis not present

## 2016-06-10 DIAGNOSIS — M150 Primary generalized (osteo)arthritis: Secondary | ICD-10-CM | POA: Diagnosis not present

## 2016-06-17 ENCOUNTER — Ambulatory Visit (INDEPENDENT_AMBULATORY_CARE_PROVIDER_SITE_OTHER): Payer: Medicare Other | Admitting: Family Medicine

## 2016-06-17 ENCOUNTER — Encounter: Payer: Self-pay | Admitting: Family Medicine

## 2016-06-17 VITALS — BP 138/77 | HR 74 | Temp 98.5°F | Resp 16 | Ht 67.0 in | Wt 173.5 lb

## 2016-06-17 DIAGNOSIS — Z124 Encounter for screening for malignant neoplasm of cervix: Secondary | ICD-10-CM | POA: Diagnosis not present

## 2016-06-17 DIAGNOSIS — Z0000 Encounter for general adult medical examination without abnormal findings: Secondary | ICD-10-CM | POA: Diagnosis not present

## 2016-06-17 DIAGNOSIS — Z9189 Other specified personal risk factors, not elsewhere classified: Secondary | ICD-10-CM | POA: Diagnosis not present

## 2016-06-17 DIAGNOSIS — Z1231 Encounter for screening mammogram for malignant neoplasm of breast: Secondary | ICD-10-CM

## 2016-06-17 DIAGNOSIS — Z1239 Encounter for other screening for malignant neoplasm of breast: Secondary | ICD-10-CM

## 2016-06-17 DIAGNOSIS — Z1159 Encounter for screening for other viral diseases: Secondary | ICD-10-CM

## 2016-06-17 LAB — LIPID PANEL
CHOLESTEROL: 268 mg/dL — AB (ref 0–200)
HDL: 58.4 mg/dL (ref >39.00)
LDL Cholesterol: 184 mg/dL — ABNORMAL HIGH (ref 0–99)
NonHDL: 209.66
TRIGLYCERIDES: 129 mg/dL (ref 0.0–149.0)
Total CHOL/HDL Ratio: 5
VLDL: 25.8 mg/dL (ref 0.0–40.0)

## 2016-06-17 LAB — CBC WITH DIFFERENTIAL/PLATELET
BASOS ABS: 0.1 10*3/uL (ref 0.0–0.1)
Basophils Relative: 0.9 % (ref 0.0–3.0)
EOS ABS: 0.2 10*3/uL (ref 0.0–0.7)
Eosinophils Relative: 3.3 % (ref 0.0–5.0)
HEMATOCRIT: 35.2 % — AB (ref 36.0–46.0)
Hemoglobin: 12.1 g/dL (ref 12.0–15.0)
LYMPHS PCT: 25.9 % (ref 12.0–46.0)
Lymphs Abs: 1.7 10*3/uL (ref 0.7–4.0)
MCHC: 34.2 g/dL (ref 30.0–36.0)
MCV: 87.4 fl (ref 78.0–100.0)
Monocytes Absolute: 0.5 10*3/uL (ref 0.1–1.0)
Monocytes Relative: 7.5 % (ref 3.0–12.0)
NEUTROS ABS: 4 10*3/uL (ref 1.4–7.7)
Neutrophils Relative %: 62.4 % (ref 43.0–77.0)
PLATELETS: 270 10*3/uL (ref 150.0–400.0)
RBC: 4.03 Mil/uL (ref 3.87–5.11)
RDW: 13.3 % (ref 11.5–15.5)
WBC: 6.5 10*3/uL (ref 4.0–10.5)

## 2016-06-17 LAB — TSH: TSH: 1.38 u[IU]/mL (ref 0.35–4.50)

## 2016-06-17 LAB — COMPREHENSIVE METABOLIC PANEL
ALBUMIN: 4.6 g/dL (ref 3.5–5.2)
ALT: 12 U/L (ref 0–35)
AST: 14 U/L (ref 0–37)
Alkaline Phosphatase: 43 U/L (ref 39–117)
BILIRUBIN TOTAL: 0.5 mg/dL (ref 0.2–1.2)
BUN: 18 mg/dL (ref 6–23)
CALCIUM: 10 mg/dL (ref 8.4–10.5)
CHLORIDE: 98 meq/L (ref 96–112)
CO2: 32 mEq/L (ref 19–32)
CREATININE: 1.05 mg/dL (ref 0.40–1.20)
GFR: 56.18 mL/min — ABNORMAL LOW (ref >60.00)
Glucose, Bld: 105 mg/dL — ABNORMAL HIGH (ref 70–99)
Potassium: 4.1 mEq/L (ref 3.5–5.1)
Sodium: 136 mEq/L (ref 135–145)
Total Protein: 6.7 g/dL (ref 6.0–8.3)
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
## 2016-06-17 NOTE — Progress Notes (Signed)
Pre visit review using our clinic review tool, if applicable. No additional management support is needed unless otherwise documented below in the visit note. 

## 2016-06-18 ENCOUNTER — Other Ambulatory Visit: Payer: Self-pay | Admitting: Family Medicine

## 2016-06-18 LAB — HEPATITIS C ANTIBODY: HCV Ab: NEGATIVE

## 2016-06-20 ENCOUNTER — Other Ambulatory Visit (INDEPENDENT_AMBULATORY_CARE_PROVIDER_SITE_OTHER): Payer: Medicare Other

## 2016-06-20 DIAGNOSIS — R7301 Impaired fasting glucose: Secondary | ICD-10-CM | POA: Diagnosis not present

## 2016-06-20 LAB — HEMOGLOBIN A1C: HEMOGLOBIN A1C: 6 % (ref 4.6–6.5)

## 2016-06-21 ENCOUNTER — Other Ambulatory Visit: Payer: Self-pay | Admitting: *Deleted

## 2016-06-21 ENCOUNTER — Encounter: Payer: Self-pay | Admitting: Family Medicine

## 2016-06-21 DIAGNOSIS — E7800 Pure hypercholesterolemia, unspecified: Secondary | ICD-10-CM

## 2016-06-21 MED ORDER — ATORVASTATIN CALCIUM 20 MG PO TABS: 20.0000 mg | ORAL_TABLET | Freq: Every day | ORAL | 2 refills | Status: DC

## 2016-06-23 ENCOUNTER — Other Ambulatory Visit: Payer: Self-pay | Admitting: Pulmonary Disease

## 2016-06-30 DIAGNOSIS — R3912 Poor urinary stream: Secondary | ICD-10-CM | POA: Diagnosis not present

## 2016-06-30 DIAGNOSIS — N200 Calculus of kidney: Secondary | ICD-10-CM | POA: Diagnosis not present

## 2016-06-30 DIAGNOSIS — N1330 Unspecified hydronephrosis: Secondary | ICD-10-CM | POA: Diagnosis not present

## 2016-06-30 DIAGNOSIS — N289 Disorder of kidney and ureter, unspecified: Secondary | ICD-10-CM | POA: Diagnosis not present

## 2016-06-30 DIAGNOSIS — R3914 Feeling of incomplete bladder emptying: Secondary | ICD-10-CM | POA: Diagnosis not present

## 2016-07-03 DIAGNOSIS — R3912 Poor urinary stream: Secondary | ICD-10-CM

## 2016-07-03 HISTORY — DX: Poor urinary stream: R39.12

## 2016-07-06 ENCOUNTER — Encounter: Payer: Self-pay | Admitting: Family Medicine

## 2016-07-06 ENCOUNTER — Telehealth: Payer: Self-pay

## 2016-07-06 NOTE — Telephone Encounter (Signed)
Please call pt.  Thanks.

## 2016-07-06 NOTE — Telephone Encounter (Signed)
Noted  

## 2016-07-06 NOTE — Telephone Encounter (Signed)
Patient wants to speak to nurse. She went to urologist, saw PA, was placed on Flomax and having symptoms of elevated blood pressure. Flomax is helping with urinary symptoms. Patient called urologist yesterday and they told her not to worry that she will see the MD in 3 weeks.

## 2016-07-06 NOTE — Telephone Encounter (Signed)
SW patient regarding symptoms. Patient reports her face has been red and hot x 2 days, believes her BP is up due to Flomax. Advised Flomax is not the probable cause, denies increased sodium intake or missing antihypertensive medication dosages. Systolic BP readings ranging 135-145 which patient states is high for her, normally in the lower 100s range. Patient requesting appointment to see PCP, scheduled for 07/07/2016.

## 2016-07-07 ENCOUNTER — Ambulatory Visit: Payer: Medicare Other | Admitting: Family Medicine

## 2016-07-07 ENCOUNTER — Ambulatory Visit: Payer: Medicare Other | Admitting: Obstetrics & Gynecology

## 2016-07-08 DIAGNOSIS — Z79891 Long term (current) use of opiate analgesic: Secondary | ICD-10-CM | POA: Diagnosis not present

## 2016-07-08 DIAGNOSIS — M797 Fibromyalgia: Secondary | ICD-10-CM | POA: Diagnosis not present

## 2016-07-08 DIAGNOSIS — Z791 Long term (current) use of non-steroidal anti-inflammatories (NSAID): Secondary | ICD-10-CM | POA: Diagnosis not present

## 2016-07-08 DIAGNOSIS — M150 Primary generalized (osteo)arthritis: Secondary | ICD-10-CM | POA: Diagnosis not present

## 2016-07-18 ENCOUNTER — Ambulatory Visit
Admission: RE | Admit: 2016-07-18 | Discharge: 2016-07-18 | Disposition: A | Discharge status: Home / Self Care | Payer: Medicare Other | Source: Ambulatory Visit | Attending: Family Medicine | Admitting: Family Medicine

## 2016-07-18 DIAGNOSIS — Z1239 Encounter for other screening for malignant neoplasm of breast: Secondary | ICD-10-CM

## 2016-07-18 DIAGNOSIS — Z1231 Encounter for screening mammogram for malignant neoplasm of breast: Secondary | ICD-10-CM | POA: Diagnosis not present

## 2016-07-28 DIAGNOSIS — N200 Calculus of kidney: Secondary | ICD-10-CM | POA: Diagnosis not present

## 2016-07-28 DIAGNOSIS — R102 Pelvic and perineal pain: Secondary | ICD-10-CM | POA: Diagnosis not present

## 2016-07-28 DIAGNOSIS — R309 Painful micturition, unspecified: Secondary | ICD-10-CM | POA: Diagnosis not present

## 2016-08-04 ENCOUNTER — Encounter: Payer: Self-pay | Admitting: Family Medicine

## 2016-08-05 DIAGNOSIS — M797 Fibromyalgia: Secondary | ICD-10-CM | POA: Diagnosis not present

## 2016-08-05 DIAGNOSIS — Z79891 Long term (current) use of opiate analgesic: Secondary | ICD-10-CM | POA: Diagnosis not present

## 2016-08-05 DIAGNOSIS — M150 Primary generalized (osteo)arthritis: Secondary | ICD-10-CM | POA: Diagnosis not present

## 2016-08-05 DIAGNOSIS — Z791 Long term (current) use of non-steroidal anti-inflammatories (NSAID): Secondary | ICD-10-CM | POA: Diagnosis not present

## 2016-08-17 ENCOUNTER — Other Ambulatory Visit: Payer: Self-pay | Admitting: Pulmonary Disease

## 2016-08-22 DIAGNOSIS — N200 Calculus of kidney: Secondary | ICD-10-CM | POA: Diagnosis not present

## 2016-08-22 DIAGNOSIS — R102 Pelvic and perineal pain: Secondary | ICD-10-CM | POA: Diagnosis not present

## 2016-08-22 DIAGNOSIS — G8929 Other chronic pain: Secondary | ICD-10-CM | POA: Diagnosis not present

## 2016-08-22 DIAGNOSIS — N182 Chronic kidney disease, stage 2 (mild): Secondary | ICD-10-CM | POA: Diagnosis not present

## 2016-08-31 ENCOUNTER — Other Ambulatory Visit: Payer: Self-pay | Admitting: *Deleted

## 2016-08-31 MED ORDER — LAMOTRIGINE 100 MG PO TABS: 300.0000 mg | ORAL_TABLET | Freq: Every day | ORAL | 11 refills | Status: DC

## 2016-08-31 NOTE — Telephone Encounter (Signed)
[facility].  RF request for lamotrigine LOV: 06/17/16 Next ov: 12/16/16 Last written: 09/17/15 #90 w/ 11RF  Please advise. Thanks.

## 2016-09-09 DIAGNOSIS — Z791 Long term (current) use of non-steroidal anti-inflammatories (NSAID): Secondary | ICD-10-CM | POA: Diagnosis not present

## 2016-09-09 DIAGNOSIS — M150 Primary generalized (osteo)arthritis: Secondary | ICD-10-CM | POA: Diagnosis not present

## 2016-09-09 DIAGNOSIS — M797 Fibromyalgia: Secondary | ICD-10-CM | POA: Diagnosis not present

## 2016-09-09 DIAGNOSIS — Z79891 Long term (current) use of opiate analgesic: Secondary | ICD-10-CM | POA: Diagnosis not present

## 2016-09-11 ENCOUNTER — Other Ambulatory Visit: Payer: Self-pay | Admitting: Family Medicine

## 2016-09-15 ENCOUNTER — Other Ambulatory Visit: Payer: Self-pay | Admitting: Family Medicine

## 2016-09-16 NOTE — Telephone Encounter (Signed)
[facility] [address]  RF request for chlorthalidone LOV: 06/17/16 Next ov: 12/16/16 Last written: 06/20/16 #30 w/ 3RF

## 2016-09-20 ENCOUNTER — Other Ambulatory Visit: Payer: Self-pay | Admitting: Family Medicine

## 2016-10-07 DIAGNOSIS — M797 Fibromyalgia: Secondary | ICD-10-CM | POA: Diagnosis not present

## 2016-10-07 DIAGNOSIS — Z79891 Long term (current) use of opiate analgesic: Secondary | ICD-10-CM | POA: Diagnosis not present

## 2016-10-07 DIAGNOSIS — M150 Primary generalized (osteo)arthritis: Secondary | ICD-10-CM | POA: Diagnosis not present

## 2016-10-07 DIAGNOSIS — Z791 Long term (current) use of non-steroidal anti-inflammatories (NSAID): Secondary | ICD-10-CM | POA: Diagnosis not present

## 2016-10-14 ENCOUNTER — Other Ambulatory Visit: Payer: Self-pay | Admitting: *Deleted

## 2016-10-14 MED ORDER — VENLAFAXINE HCL ER 150 MG PO CP24: 150.0000 mg | ORAL_CAPSULE | Freq: Every day | ORAL | 3 refills | Status: DC

## 2016-10-14 NOTE — Telephone Encounter (Signed)
[facility].  RF request for venlafaxine LOV: 06/19/16 Next ov:  12/16/16 Last written: 09/17/15 #30 w/ 11RF

## 2016-10-21 ENCOUNTER — Other Ambulatory Visit: Payer: Self-pay | Admitting: *Deleted

## 2016-10-21 MED ORDER — FLUTICASONE PROPIONATE 50 MCG/ACT NA SUSP: 2.0000 | Freq: Every day | NASAL | 11 refills | Status: DC

## 2016-10-21 NOTE — Telephone Encounter (Signed)
[facility] [address]  RF request for fluticasone LOV: 06/17/16 Next ov: 12/16/16 Last written: 09/17/15 #16g w/ 8RF

## 2016-11-07 DIAGNOSIS — R102 Pelvic and perineal pain: Secondary | ICD-10-CM | POA: Diagnosis not present

## 2016-11-07 DIAGNOSIS — G8929 Other chronic pain: Secondary | ICD-10-CM | POA: Diagnosis not present

## 2016-11-07 DIAGNOSIS — N182 Chronic kidney disease, stage 2 (mild): Secondary | ICD-10-CM | POA: Diagnosis not present

## 2016-11-07 DIAGNOSIS — N200 Calculus of kidney: Secondary | ICD-10-CM | POA: Diagnosis not present

## 2016-11-09 DIAGNOSIS — M797 Fibromyalgia: Secondary | ICD-10-CM | POA: Diagnosis not present

## 2016-11-09 DIAGNOSIS — M150 Primary generalized (osteo)arthritis: Secondary | ICD-10-CM | POA: Diagnosis not present

## 2016-11-09 DIAGNOSIS — Z79891 Long term (current) use of opiate analgesic: Secondary | ICD-10-CM | POA: Diagnosis not present

## 2016-11-09 DIAGNOSIS — Z791 Long term (current) use of non-steroidal anti-inflammatories (NSAID): Secondary | ICD-10-CM | POA: Diagnosis not present

## 2016-11-11 ENCOUNTER — Other Ambulatory Visit: Payer: Self-pay | Admitting: Pulmonary Disease

## 2016-11-30 ENCOUNTER — Other Ambulatory Visit: Payer: Self-pay | Admitting: Family Medicine

## 2016-12-02 DIAGNOSIS — N200 Calculus of kidney: Secondary | ICD-10-CM | POA: Diagnosis not present

## 2016-12-07 ENCOUNTER — Other Ambulatory Visit: Payer: Self-pay | Admitting: Family Medicine

## 2016-12-07 DIAGNOSIS — M150 Primary generalized (osteo)arthritis: Secondary | ICD-10-CM | POA: Diagnosis not present

## 2016-12-07 DIAGNOSIS — M797 Fibromyalgia: Secondary | ICD-10-CM | POA: Diagnosis not present

## 2016-12-07 DIAGNOSIS — Z791 Long term (current) use of non-steroidal anti-inflammatories (NSAID): Secondary | ICD-10-CM | POA: Diagnosis not present

## 2016-12-07 DIAGNOSIS — Z79891 Long term (current) use of opiate analgesic: Secondary | ICD-10-CM | POA: Diagnosis not present

## 2016-12-07 NOTE — Telephone Encounter (Signed)
[facility] [address] 

## 2016-12-14 ENCOUNTER — Encounter: Payer: Self-pay | Admitting: Obstetrics & Gynecology

## 2016-12-14 ENCOUNTER — Ambulatory Visit (INDEPENDENT_AMBULATORY_CARE_PROVIDER_SITE_OTHER): Payer: Medicare Other | Admitting: Obstetrics & Gynecology

## 2016-12-14 VITALS — BP 106/68 | Ht 66.0 in | Wt 170.0 lb

## 2016-12-14 DIAGNOSIS — M810 Age-related osteoporosis without current pathological fracture: Secondary | ICD-10-CM | POA: Diagnosis not present

## 2016-12-14 DIAGNOSIS — Z780 Asymptomatic menopausal state: Secondary | ICD-10-CM | POA: Diagnosis not present

## 2016-12-14 DIAGNOSIS — Z01419 Encounter for gynecological examination (general) (routine) without abnormal findings: Secondary | ICD-10-CM | POA: Diagnosis not present

## 2016-12-14 NOTE — Progress Notes (Signed)
Erin Lopez 1951-03-06 [id number]   History:    66 y.o. G3P1A2 Widowed.  Currently abstinent.  RP:  New patient presenting for annual gyn exam   HPI:  Menopause.  No HRT.  No PMB.  No pelvic pain.  No vaginal d/c.  Breasts wnl.  Mictions/BMs chronic constipation.  Patient has Fibromyalgia, feels very tired today, but no major Depression Sx.  Past medical history,surgical history, family history and social history were all reviewed and documented in the EPIC chart.  Gynecologic History No LMP recorded. Patient is postmenopausal. Contraception: post menopausal status/Abstinent Last Pap: 2017. Results were: normal per patient Last mammogram: 07/2016. Results were: Neg Bone Density 06/2015.  T-Score -2.6 at Rt Femoral neck.  Not on any treatment. [place] 2016  Obstetric History OB History  Gravida Para Term Preterm AB Living  3 1       1   SAB TAB Ectopic Multiple Live Births               # Outcome Date GA Lbr Len/2nd Weight Sex Delivery Anes PTL Lv  3 Gravida           2 Gravida           1 Para                ROS: A ROS was performed and pertinent positives and negatives are included in the history.  GENERAL: No fevers or chills. HEENT: No change in vision, no earache, sore throat or sinus congestion. NECK: No pain or stiffness. CARDIOVASCULAR: No chest pain or pressure. No palpitations. PULMONARY: No shortness of breath, cough or wheeze. GASTROINTESTINAL: No abdominal pain, nausea, vomiting or diarrhea, melena or bright red blood per rectum. GENITOURINARY: No urinary frequency, urgency, hesitancy or dysuria. MUSCULOSKELETAL: No joint or muscle pain, no back pain, no recent trauma. DERMATOLOGIC: No rash, no itching, no lesions. ENDOCRINE: No polyuria, polydipsia, no heat or cold intolerance. No recent change in weight. HEMATOLOGICAL: No anemia or easy bruising or bleeding. NEUROLOGIC: No headache, seizures, numbness, tingling or weakness. PSYCHIATRIC: No depression, no loss  of interest in normal activity or change in sleep pattern.     Exam:   BP 106/68   Ht 5\' 6"  (1.676 m)   Wt 170 lb (77.1 kg)   BMI 27.44 kg/m   Body mass index is 27.44 kg/m.  General appearance : Well developed well nourished female. No acute distress HEENT: Eyes: no retinal hemorrhage or exudates,  Neck supple, trachea midline, no carotid bruits, no thyroidmegaly Lungs: Clear to auscultation, no rhonchi or wheezes, or rib retractions  Heart: Regular rate and rhythm, no murmurs or gallops Breast:Examined in sitting and supine position were symmetrical in appearance, no palpable masses or tenderness,  no skin retraction, no nipple inversion, no nipple discharge, no skin discoloration, no axillary or supraclavicular lymphadenopathy Abdomen: no palpable masses or tenderness, no rebound or guarding Extremities: no edema or skin discoloration or tenderness  Pelvic: Vulva normal  Bartholin, Urethra, Skene Glands: Within normal limits             Vagina: No gross lesions or discharge  Cervix: No gross lesions or discharge.  Pap reflex done.  Uterus  AV, normal size, shape and consistency, non-tender and mobile  Adnexa  Without masses or tenderness  Anus and perineum  normal    Assessment/Plan:  66 y.o. female for annual exam   1. Encounter for routine gynecological examination with Papanicolaou smear of cervix Gyn exam  normal for Menopause.  Pap reflex done.  Breasts wnl.  Screening Mammo neg 07/2016.  2. Menopause present No HRT.  No PMB.  Well tolerated.  Abstinent.  3. Age-related osteoporosis without current pathological fracture Bone Density 06/2015.  T-Score -2.6 at Rt Femoral neck.  Not on any treatment.  Importance of treating Osteoporosis discussed.  Prolia usage/Benefits/Risks briefly reviewed and pamphlet given.  Will continue Vit D supplements/Ca++ in nutrition/Weight bearing physical activity.  Will read the Prolia pamphlet and f/u for further discussion and decision  making on treatment of Osteoporosis.  Counseling on above issues >50% x 10 minutes.   Princess Bruins MD, 12:07 PM 12/14/2016

## 2016-12-15 ENCOUNTER — Other Ambulatory Visit: Payer: Self-pay | Admitting: Pulmonary Disease

## 2016-12-15 LAB — PAP IG W/ RFLX HPV ASCU

## 2016-12-16 ENCOUNTER — Ambulatory Visit: Payer: Medicare Other | Admitting: Family Medicine

## 2016-12-17 NOTE — Patient Instructions (Signed)
1. Encounter for routine gynecological examination with Papanicolaou smear of cervix Gyn exam normal for Menopause.  Pap reflex done.  Breasts wnl.  Screening Mammo neg 07/2016.  2. Menopause present No HRT.  No PMB.  Well tolerated.  Abstinent.  3. Age-related osteoporosis without current pathological fracture Bone Density 06/2015.  T-Score -2.6 at Rt Femoral neck.  Not on any treatment.  Importance of treating Osteoporosis discussed.  Prolia usage/Benefits/Risks briefly reviewed and pamphlet given.  Will continue Vit D supplements/Ca++ in nutrition/Weight bearing physical activity.  Will read the Prolia pamphlet and f/u for further discussion and decision making on treatment of Osteoporosis.  Erin Lopez, it was a pleasure to meet you today!  Please schedule a visit to discuss Osteoporosis and treatment as soon as you are ready.   Osteoporosis Osteoporosis is the thinning and loss of density in the bones. Osteoporosis makes the bones more brittle, fragile, and likely to break (fracture). Over time, osteoporosis can cause the bones to become so weak that they fracture after a simple fall. The bones most likely to fracture are the bones in the hip, wrist, and spine. What are the causes? The exact cause is not known. What increases the risk? Anyone can develop osteoporosis. You may be at greater risk if you have a family history of the condition or have poor nutrition. You may also have a higher risk if you are:  Female.  64 years old or older.  A smoker.  Not physically active.  White or Asian.  Slender.  What are the signs or symptoms? A fracture might be the first sign of the disease, especially if it results from a fall or injury that would not usually cause a bone to break. Other signs and symptoms include:  Low back and neck pain.  Stooped posture.  Height loss.  How is this diagnosed? To make a diagnosis, your health care provider may:  Take a medical history.  Perform  a physical exam.  Order tests, such as: ? A bone mineral density test. ? A dual-energy X-ray absorptiometry test.  How is this treated? The goal of osteoporosis treatment is to strengthen your bones to reduce your risk of a fracture. Treatment may involve:  Making lifestyle changes, such as: ? Eating a diet rich in calcium. ? Doing weight-bearing and muscle-strengthening exercises. ? Stopping tobacco use. ? Limiting alcohol intake.  Taking medicine to slow the process of bone loss or to increase bone density.  Monitoring your levels of calcium and vitamin D.  Follow these instructions at home:  Include calcium and vitamin D in your diet. Calcium is important for bone health, and vitamin D helps the body absorb calcium.  Perform weight-bearing and muscle-strengthening exercises as directed by your health care provider.  Do not use any tobacco products, including cigarettes, chewing tobacco, and electronic cigarettes. If you need help quitting, ask your health care provider.  Limit your alcohol intake.  Take medicines only as directed by your health care provider.  Keep all follow-up visits as directed by your health care provider. This is important.  Take precautions at home to lower your risk of falling, such as: ? Keeping rooms well lit and clutter free. ? Installing safety rails on stairs. ? Using rubber mats in the bathroom and other areas that are often wet or slippery. Get help right away if: You fall or injure yourself. This information is not intended to replace advice given to you by your health care provider. Make sure you  have with your health care provider. Document Released: 12/29/2004 Document Revised: 08/24/2015 Document Reviewed: 08/29/2013 Elsevier Interactive Patient Education  2017 [employer]  

## 2016-12-21 ENCOUNTER — Ambulatory Visit (INDEPENDENT_AMBULATORY_CARE_PROVIDER_SITE_OTHER): Payer: Medicare Other | Admitting: Family Medicine

## 2016-12-21 ENCOUNTER — Encounter: Payer: Self-pay | Admitting: Family Medicine

## 2016-12-21 VITALS — BP 81/53 | HR 69 | Temp 98.3°F | Resp 16 | Ht 66.0 in | Wt 165.5 lb

## 2016-12-21 DIAGNOSIS — I952 Hypotension due to drugs: Secondary | ICD-10-CM

## 2016-12-21 DIAGNOSIS — E7800 Pure hypercholesterolemia, unspecified: Secondary | ICD-10-CM

## 2016-12-21 DIAGNOSIS — Z23 Encounter for immunization: Secondary | ICD-10-CM

## 2016-12-21 DIAGNOSIS — I10 Essential (primary) hypertension: Secondary | ICD-10-CM

## 2016-12-21 DIAGNOSIS — R7303 Prediabetes: Secondary | ICD-10-CM | POA: Diagnosis not present

## 2016-12-21 MED ORDER — CARVEDILOL 25 MG PO TABS: ORAL_TABLET | ORAL | 3 refills | Status: DC

## 2016-12-21 NOTE — Patient Instructions (Signed)
Decrease your carvedilol to HALF of a 25mg  pill TWICE per day.

## 2016-12-21 NOTE — Progress Notes (Signed)
OFFICE VISIT  12/21/2016   CC:  Chief Complaint  Patient presents with  . Follow-up    RCI, pt is not fasting.    HPI:    Patient is a 66 y.o. Caucasian female who presents for 6 mo f/u HTN, HLD, IFG/prediabetes. After labs returned last visit she was given dietary instructions to try to help her prediabetes and was rx'd atorvastatin.  She was instructed to return for lab visit in 3 mo to recheck lipid panel but she did not do this.  HTN: home bp monitoring shows systolic consistently <469 lately.  She doesn't recall and HR's or diastolic measurements.  She doesn't recall any BPs from her recent urology visits.  Has no orthostatic dizziness.  No CP, no SOB, no LE swelling.  HLD: taking atorvastatin, denies side effects.    IFG: Diet: trying to eat less protein, increase fruit and vegetables, avoid simple carbs, avoids red meat more. Exercise: she is not sedentary but minimal exercise due to chronic foot pain.   Past Medical History:  Diagnosis Date  . Alcoholism in remission [facility])    states last alcohol 11 yrs ago  . Anxiety   . Bipolar 1 disorder [facility])    sees a psychiatrist  . Cervical spondylosis without myelopathy 12/19/2013   MRI 02/2014: multilevel DDD/spondylosis, not much change compared to prior MRI.  Pt not in favor of invasive therapy for her neck as of 04/2014.  Marland Kitchen Chronic renal insufficiency, stage II (mild)   . COPD (chronic obstructive pulmonary disease) ([place])   . Environmental allergies   . Fibromyalgia   . GERD (gastroesophageal reflux disease)   . Hiatal hernia   . History of adenomatous polyp of colon 04/2008; 08/2014   No high grade dysplasia: recall 5 yrs ([name], [name])  . History of basal cell carcinoma excision    NOSE  . History of Helicobacter pylori infection 04/2008   +gastric biopsy (gastritis but no metaplasia, dysplasia, or malignancy identified)  . History of TIA (transient ischemic attack)    secondary to HELLP syndrome 1994 post c/s--   residual memory loss  . Hyperlipidemia    no meds in > a decade per pt.  Recommended statin 06/21/16.  Marland Kitchen Hypertension   . IFG (impaired fasting glucose) 06/2016   Fasting gluc 105; HbA1c at that time was 6.0%.  . Internal hemorrhoids   . Left foot pain    Hallux deformity+adhesive capsulitis+ hammertoe:  [name] recommended surgery but pt declines as of 05/2016.  . Memory loss    Abnl MRI brain and CT brain c/w chronic microvascular ischemia.  Repeat MRI brain 02/2014 stable (Dr. Mayra Reel with no plan to f/u with neuro as of 04/2014)  . Osteoporosis 05/2010   2012 'penia; 06/2015 'porosis:  prolia.  repeat DEXA 2 yrs.  As of 04/2016 pt going to discuss w/GYN whether or not to continue prolia.  . Pelvic floor dysfunction    [facility] ([name])  . Recurrent kidney stones 08/2013   Left ureteral calculus: Perc nephr--cystoscopy w/ureteroscopy + laser for stone removal.  Residual asymptomatic left renal nephrolithiasis <32mm present post-procedure.  Right sided hydronephrosis-persistent (on u/s)--urol ordered CT to further eval 08/2016: 1.6 cm nonobstructing stone lower pole left kidney, no hydro, plan for PCN extraction ([facility])  . Shortness of breath   . Sprain of neck 12/19/2013  . TMJ (temporomandibular joint disorder)    USES  MOUTH GUARD  . Vitamin D deficiency 05/2011  . Weak  urinary stream 07/2016   with elevated PVR and mild left hydronephrosis secondary to this ([facility] started flomax 0.4mg  qd for this).    Past Surgical History:  Procedure Laterality Date  . ANTERIOR CERVICAL DECOMP/DISCECTOMY FUSION  06-30-2009   C3 -- C5  AND EXPLORATION OF FUSION C5-7 W/  PLATE REMOVAL  . CERVICAL FUSION  2002   anterior C5 -- C7  . CERVICAL SPINE SURGERY  08-27-1999     C6 -- T1  LAMINECTOMY/  DISKECTOMY  . CESAREAN SECTION  1994  . COLONOSCOPY W/ POLYPECTOMY  04/2008;08/2014   2016 tubular adenoma x 1; +diverticulosis and int/ext hemorrhoids.  Recall 5 yrs  . CYSTOSCOPY  WITH URETEROSCOPY AND STENT PLACEMENT Left 08/28/2013   Procedure: CYSTOSCOPY, RGP,  WITH URETEROSCOPY AND STENT REMOVAL;  Surgeon: [name], MD;  Location: [facility];  Service: Urology;  Laterality: Left;  . ESOPHAGOGASTRODUODENOSCOPY  2010; 08/2014   +Candidal esophagitis; Mild chronic gastritis w/intestinal metaplasia--NEG H pylori, neg for eosinophilic esoph  . HOLMIUM LASER APPLICATION Left 5/78/4696   Procedure: HOLMIUM LASER APPLICATION;  Surgeon: [name], MD;  Location[facility];  Service: Urology;  Laterality: Left;  . NEGATIVE SLEEP STUDY  04-01-2007  . NEPHROLITHOTOMY Left 08/05/2013   Procedure: NEPHROLITHOTOMY PERCUTANEOUS;  Surgeon: [name], MD;  Location: [facility];  Service: Urology;  Laterality: Left;  . TONSILLECTOMY AND ADENOIDECTOMY  1975  . TOTAL KNEE ARTHROPLASTY Right 2003  . ULTRASOUND EXAM,PELVIC COMPLETE ([facility] HX)  06/25/15   NORMAL (done by GYN)    Outpatient Medications Prior to Visit  Medication Sig Dispense Refill  . ADVAIR DISKUS 250-50 MCG/DOSE AEPB INHALE 1 PUFF INTO THE LUNGS 2 (TWO) TIMES DAILY. 60 each 5  . albuterol (PROAIR HFA) 108 (90 Base) MCG/ACT inhaler INHALE 2 PUFFS INTO THE LUNGS EVERY 4 (FOUR) HOURS AS NEEDED FOR WHEEZING OR SHORTNESS OF BREATH. 8.5 each 1  . ALPRAZolam (XANAX) 1 MG tablet Take 1 mg by mouth 3 (three) times daily as needed.     Marland Kitchen atorvastatin (LIPITOR) 20 MG tablet TAKE 1 TABLET (20 MG TOTAL) BY MOUTH DAILY. 30 tablet 1  . B Complex-C (B-COMPLEX WITH VITAMIN C) tablet Take 1 tablet by mouth daily.    . calcium & magnesium carbonates (MYLANTA) 295-284 MG tablet Take 1 tablet by mouth daily.    . cetirizine (ZYRTEC) 10 MG tablet Take 10 mg by mouth every morning.     . chlorthalidone (HYGROTON) 25 MG tablet TAKE 1 TABLET (25 MG TOTAL) BY MOUTH DAILY. 30 tablet 5  . cyclobenzaprine (FLEXERIL) 10 MG tablet Take 10 mg by mouth 2 (two) times daily as needed.     . fluticasone (CUTIVATE)  0.05 % cream     . fluticasone (FLONASE) 50 MCG/ACT nasal spray Place 2 sprays into both nostrils daily. 16 g 11  . INCRUSE ELLIPTA 62.5 MCG/INH AEPB INHALE 1 PUFF INTO THE LUNGS DAILY. 30 each 0  . ketoconazole (NIZORAL) 2 % cream     . lamoTRIgine (LAMICTAL) 100 MG tablet Take 3 tablets (300 mg total) by mouth at bedtime. 90 tablet 11  . lisinopril (PRINIVIL,ZESTRIL) 40 MG tablet TAKE 1 TABLET (40 MG TOTAL) BY MOUTH DAILY. 30 tablet 5  . montelukast (SINGULAIR) 10 MG tablet Take 1 tablet (10 mg total) by mouth every evening. 30 tablet 12  . Multiple Vitamin (MULTIVITAMIN) tablet Take 1 tablet by mouth daily.    . Oxycodone HCl 10 MG TABS  Take 1 tablet by mouth 3 (three) times daily.    . polyethylene glycol powder (GLYCOLAX/MIRALAX) powder Take 17 g by mouth daily. 3162 g 12  . ranitidine (ZANTAC) 150 MG capsule Take 150 mg by mouth 2 (two) times daily as needed.     . venlafaxine XR (EFFEXOR-XR) 150 MG 24 hr capsule Take 1 capsule (150 mg total) by mouth daily with breakfast. 30 capsule 3  . zolpidem (AMBIEN) 10 MG tablet Take 10 mg by mouth at bedtime.    . carvedilol (COREG) 25 MG tablet TAKE 1 TABLET (25 MG TOTAL) BY MOUTH 2 (TWO) TIMES DAILY WITH A MEAL. 60 tablet 3  . naproxen (NAPROSYN) 500 MG tablet Take 1 tablet (500 mg total) by mouth 2 (two) times daily with a meal. (Patient not taking: Reported on 12/21/2016) 30 tablet 0   No facility-administered medications prior to visit.     Allergies  Allergen Reactions  . Ceftin [Cefuroxime Axetil] Anaphylaxis    Swelling of eyes and tongue  . Ciprofloxacin Anaphylaxis    Difficulty breathing and generalized swelling  . Fosamax [Alendronate Sodium] Diarrhea  . Morphine And Related Other (See Comments)    Hallucinations/ disoriented  . Prednisone Other (See Comments)    Hyperactivity    ROS As per HPI  PE: Blood pressure (!) 81/53, pulse 69, temperature 98.3 F (36.8 C), temperature source Oral, resp. rate 16, height 5\' 6"   (1.676 m), weight 165 lb 8 oz (75.1 kg), SpO2 92 %. Body mass index is 26.71 kg/m.  Gen: Alert, well appearing.  Patient is oriented to person, place, time, and situation. AFFECT: pleasant, lucid thought and speech. CV: RRR, no m/r/g.   LUNGS: CTA bilat, nonlabored resps, good aeration in all lung fields. EXT: no clubbing, cyanosis, or edema.   LABS:  Lab Results  Component Value Date   TSH 1.38 06/17/2016   Lab Results  Component Value Date   WBC 6.5 06/17/2016   HGB 12.1 06/17/2016   HCT 35.2 (L) 06/17/2016   MCV 87.4 06/17/2016   PLT 270.0 06/17/2016   Lab Results  Component Value Date   CREATININE 1.05 06/17/2016   BUN 18 06/17/2016   NA 136 06/17/2016   K 4.1 06/17/2016   CL 98 06/17/2016   CO2 32 06/17/2016   Lab Results  Component Value Date   ALT 12 06/17/2016   AST 14 06/17/2016   ALKPHOS 43 06/17/2016   BILITOT 0.5 06/17/2016   Lab Results  Component Value Date   CHOL 268 (H) 06/17/2016   Lab Results  Component Value Date   HDL 58.40 06/17/2016   Lab Results  Component Value Date   LDLCALC 184 (H) 06/17/2016   Lab Results  Component Value Date   TRIG 129.0 06/17/2016   Lab Results  Component Value Date   CHOLHDL 5 06/17/2016   Lab Results  Component Value Date   HGBA1C 6.0 06/20/2016   IMPRESSION AND PLAN:  1) HTN: low bp's here and at home--she is overmedicated. Decrease carvedolol to 1/2 of 25mg  tab bid.  Continue current chlorthalidone and lisinopril dosing. We'll repeat lytes/cr at next f/u in 1 mo.  2) Hyperlipidemia: tolerating statin.  Not fasting today. We'll have FLP rechecked at next f/u visit in 1 mo.  3) IFG/prediabetes: she has made some dietary changes. Unable to exercise much due to chronic foot pain (getting followed by podiatry for this). We'll recheck HbA1c at next f/u visit in 1 mo.  4) Recurrent kidney  stones: she'll be having a stone extraction procedure at [facility] in about 2 weeks.  An After Visit Summary was  printed and given to the patient.  FOLLOW UP: Return in about 4 weeks (around 01/18/2017) for f/u blood pressure (FASTING).  Signed:  Crissie Sickles, MD           12/21/2016

## 2016-12-26 ENCOUNTER — Encounter: Payer: Self-pay | Admitting: Family Medicine

## 2016-12-29 DIAGNOSIS — I10 Essential (primary) hypertension: Secondary | ICD-10-CM | POA: Insufficient documentation

## 2016-12-29 DIAGNOSIS — F319 Bipolar disorder, unspecified: Secondary | ICD-10-CM | POA: Insufficient documentation

## 2016-12-29 DIAGNOSIS — Z8673 Personal history of transient ischemic attack (TIA), and cerebral infarction without residual deficits: Secondary | ICD-10-CM | POA: Insufficient documentation

## 2016-12-29 DIAGNOSIS — G459 Transient cerebral ischemic attack, unspecified: Secondary | ICD-10-CM | POA: Insufficient documentation

## 2017-01-05 DIAGNOSIS — Z885 Allergy status to narcotic agent status: Secondary | ICD-10-CM | POA: Diagnosis not present

## 2017-01-05 DIAGNOSIS — K219 Gastro-esophageal reflux disease without esophagitis: Secondary | ICD-10-CM | POA: Diagnosis not present

## 2017-01-05 DIAGNOSIS — Z79899 Other long term (current) drug therapy: Secondary | ICD-10-CM | POA: Diagnosis not present

## 2017-01-05 DIAGNOSIS — Z8673 Personal history of transient ischemic attack (TIA), and cerebral infarction without residual deficits: Secondary | ICD-10-CM | POA: Diagnosis not present

## 2017-01-05 DIAGNOSIS — Z79891 Long term (current) use of opiate analgesic: Secondary | ICD-10-CM | POA: Diagnosis not present

## 2017-01-05 DIAGNOSIS — N200 Calculus of kidney: Secondary | ICD-10-CM | POA: Diagnosis not present

## 2017-01-05 DIAGNOSIS — J449 Chronic obstructive pulmonary disease, unspecified: Secondary | ICD-10-CM | POA: Diagnosis not present

## 2017-01-05 DIAGNOSIS — N182 Chronic kidney disease, stage 2 (mild): Secondary | ICD-10-CM | POA: Diagnosis not present

## 2017-01-05 DIAGNOSIS — G4700 Insomnia, unspecified: Secondary | ICD-10-CM | POA: Diagnosis not present

## 2017-01-05 DIAGNOSIS — Z7951 Long term (current) use of inhaled steroids: Secondary | ICD-10-CM | POA: Diagnosis not present

## 2017-01-05 DIAGNOSIS — Z881 Allergy status to other antibiotic agents status: Secondary | ICD-10-CM | POA: Diagnosis not present

## 2017-01-05 DIAGNOSIS — Z981 Arthrodesis status: Secondary | ICD-10-CM | POA: Diagnosis not present

## 2017-01-05 DIAGNOSIS — E785 Hyperlipidemia, unspecified: Secondary | ICD-10-CM | POA: Diagnosis not present

## 2017-01-05 DIAGNOSIS — Z87891 Personal history of nicotine dependence: Secondary | ICD-10-CM | POA: Diagnosis not present

## 2017-01-05 DIAGNOSIS — M47812 Spondylosis without myelopathy or radiculopathy, cervical region: Secondary | ICD-10-CM | POA: Diagnosis not present

## 2017-01-05 DIAGNOSIS — I679 Cerebrovascular disease, unspecified: Secondary | ICD-10-CM | POA: Diagnosis not present

## 2017-01-05 DIAGNOSIS — Z888 Allergy status to other drugs, medicaments and biological substances status: Secondary | ICD-10-CM | POA: Diagnosis not present

## 2017-01-05 DIAGNOSIS — I129 Hypertensive chronic kidney disease with stage 1 through stage 4 chronic kidney disease, or unspecified chronic kidney disease: Secondary | ICD-10-CM | POA: Diagnosis not present

## 2017-01-05 DIAGNOSIS — G8929 Other chronic pain: Secondary | ICD-10-CM | POA: Diagnosis not present

## 2017-01-06 DIAGNOSIS — Z885 Allergy status to narcotic agent status: Secondary | ICD-10-CM | POA: Diagnosis not present

## 2017-01-06 DIAGNOSIS — Z79891 Long term (current) use of opiate analgesic: Secondary | ICD-10-CM | POA: Diagnosis not present

## 2017-01-06 DIAGNOSIS — N132 Hydronephrosis with renal and ureteral calculous obstruction: Secondary | ICD-10-CM | POA: Diagnosis not present

## 2017-01-06 DIAGNOSIS — Z7951 Long term (current) use of inhaled steroids: Secondary | ICD-10-CM | POA: Diagnosis not present

## 2017-01-06 DIAGNOSIS — M47812 Spondylosis without myelopathy or radiculopathy, cervical region: Secondary | ICD-10-CM | POA: Diagnosis not present

## 2017-01-06 DIAGNOSIS — Z79899 Other long term (current) drug therapy: Secondary | ICD-10-CM | POA: Diagnosis not present

## 2017-01-06 DIAGNOSIS — K219 Gastro-esophageal reflux disease without esophagitis: Secondary | ICD-10-CM | POA: Diagnosis not present

## 2017-01-06 DIAGNOSIS — N182 Chronic kidney disease, stage 2 (mild): Secondary | ICD-10-CM | POA: Diagnosis not present

## 2017-01-06 DIAGNOSIS — Z888 Allergy status to other drugs, medicaments and biological substances status: Secondary | ICD-10-CM | POA: Diagnosis not present

## 2017-01-06 DIAGNOSIS — N200 Calculus of kidney: Secondary | ICD-10-CM | POA: Diagnosis not present

## 2017-01-06 DIAGNOSIS — S37012A Minor contusion of left kidney, initial encounter: Secondary | ICD-10-CM | POA: Diagnosis not present

## 2017-01-06 DIAGNOSIS — Z881 Allergy status to other antibiotic agents status: Secondary | ICD-10-CM | POA: Diagnosis not present

## 2017-01-06 DIAGNOSIS — Z87891 Personal history of nicotine dependence: Secondary | ICD-10-CM | POA: Diagnosis not present

## 2017-01-06 DIAGNOSIS — G4700 Insomnia, unspecified: Secondary | ICD-10-CM | POA: Diagnosis not present

## 2017-01-06 DIAGNOSIS — J449 Chronic obstructive pulmonary disease, unspecified: Secondary | ICD-10-CM | POA: Diagnosis not present

## 2017-01-06 DIAGNOSIS — E785 Hyperlipidemia, unspecified: Secondary | ICD-10-CM | POA: Diagnosis not present

## 2017-01-06 DIAGNOSIS — G8929 Other chronic pain: Secondary | ICD-10-CM | POA: Diagnosis not present

## 2017-01-06 DIAGNOSIS — Z8673 Personal history of transient ischemic attack (TIA), and cerebral infarction without residual deficits: Secondary | ICD-10-CM | POA: Diagnosis not present

## 2017-01-06 DIAGNOSIS — Z981 Arthrodesis status: Secondary | ICD-10-CM | POA: Diagnosis not present

## 2017-01-06 DIAGNOSIS — I679 Cerebrovascular disease, unspecified: Secondary | ICD-10-CM | POA: Diagnosis not present

## 2017-01-06 DIAGNOSIS — I129 Hypertensive chronic kidney disease with stage 1 through stage 4 chronic kidney disease, or unspecified chronic kidney disease: Secondary | ICD-10-CM | POA: Diagnosis not present

## 2017-01-08 DIAGNOSIS — Z87891 Personal history of nicotine dependence: Secondary | ICD-10-CM | POA: Diagnosis not present

## 2017-01-08 DIAGNOSIS — Z885 Allergy status to narcotic agent status: Secondary | ICD-10-CM | POA: Diagnosis not present

## 2017-01-08 DIAGNOSIS — E871 Hypo-osmolality and hyponatremia: Secondary | ICD-10-CM | POA: Diagnosis not present

## 2017-01-08 DIAGNOSIS — N182 Chronic kidney disease, stage 2 (mild): Secondary | ICD-10-CM | POA: Diagnosis not present

## 2017-01-08 DIAGNOSIS — D649 Anemia, unspecified: Secondary | ICD-10-CM | POA: Diagnosis not present

## 2017-01-08 DIAGNOSIS — R319 Hematuria, unspecified: Secondary | ICD-10-CM | POA: Diagnosis not present

## 2017-01-08 DIAGNOSIS — Z888 Allergy status to other drugs, medicaments and biological substances status: Secondary | ICD-10-CM | POA: Diagnosis not present

## 2017-01-08 DIAGNOSIS — R339 Retention of urine, unspecified: Secondary | ICD-10-CM | POA: Diagnosis not present

## 2017-01-08 DIAGNOSIS — Z881 Allergy status to other antibiotic agents status: Secondary | ICD-10-CM | POA: Diagnosis not present

## 2017-01-08 DIAGNOSIS — Z936 Other artificial openings of urinary tract status: Secondary | ICD-10-CM | POA: Diagnosis not present

## 2017-01-08 DIAGNOSIS — R300 Dysuria: Secondary | ICD-10-CM | POA: Diagnosis not present

## 2017-01-08 DIAGNOSIS — Z9889 Other specified postprocedural states: Secondary | ICD-10-CM | POA: Diagnosis not present

## 2017-01-08 DIAGNOSIS — N3289 Other specified disorders of bladder: Secondary | ICD-10-CM | POA: Diagnosis not present

## 2017-01-08 DIAGNOSIS — R1030 Lower abdominal pain, unspecified: Secondary | ICD-10-CM | POA: Diagnosis not present

## 2017-01-08 DIAGNOSIS — R39198 Other difficulties with micturition: Secondary | ICD-10-CM | POA: Diagnosis not present

## 2017-01-08 DIAGNOSIS — I129 Hypertensive chronic kidney disease with stage 1 through stage 4 chronic kidney disease, or unspecified chronic kidney disease: Secondary | ICD-10-CM | POA: Diagnosis not present

## 2017-01-09 DIAGNOSIS — R339 Retention of urine, unspecified: Secondary | ICD-10-CM | POA: Diagnosis not present

## 2017-01-16 ENCOUNTER — Ambulatory Visit (INDEPENDENT_AMBULATORY_CARE_PROVIDER_SITE_OTHER): Payer: Medicare Other | Admitting: Family Medicine

## 2017-01-16 ENCOUNTER — Encounter: Payer: Self-pay | Admitting: Family Medicine

## 2017-01-16 VITALS — BP 128/66 | HR 79 | Temp 97.9°F | Resp 16 | Wt 168.0 lb

## 2017-01-16 DIAGNOSIS — E7800 Pure hypercholesterolemia, unspecified: Secondary | ICD-10-CM

## 2017-01-16 DIAGNOSIS — R7303 Prediabetes: Secondary | ICD-10-CM | POA: Diagnosis not present

## 2017-01-16 DIAGNOSIS — I10 Essential (primary) hypertension: Secondary | ICD-10-CM | POA: Diagnosis not present

## 2017-01-16 DIAGNOSIS — I952 Hypotension due to drugs: Secondary | ICD-10-CM | POA: Diagnosis not present

## 2017-01-16 LAB — LIPID PANEL
CHOLESTEROL: 140 mg/dL (ref 0–200)
HDL: 49.5 mg/dL (ref >39.00)
LDL Cholesterol: 70 mg/dL (ref 0–99)
NonHDL: 90.38
TRIGLYCERIDES: 102 mg/dL (ref 0.0–149.0)
Total CHOL/HDL Ratio: 3
VLDL: 20.4 mg/dL (ref 0.0–40.0)

## 2017-01-16 LAB — BASIC METABOLIC PANEL
BUN: 14 mg/dL (ref 6–23)
CO2: 34 mEq/L — ABNORMAL HIGH (ref 19–32)
CREATININE: 0.93 mg/dL (ref 0.40–1.20)
Calcium: 9.9 mg/dL (ref 8.4–10.5)
Chloride: 92 mEq/L — ABNORMAL LOW (ref 96–112)
GFR: 64.5 mL/min (ref >60.00)
Glucose, Bld: 93 mg/dL (ref 70–99)
Potassium: 4.1 mEq/L (ref 3.5–5.1)
SODIUM: 132 meq/L — AB (ref 135–145)

## 2017-01-16 LAB — HEMOGLOBIN A1C: HEMOGLOBIN A1C: 5.8 % (ref 4.6–6.5)

## 2017-01-16 NOTE — Progress Notes (Signed)
OFFICE VISIT  01/16/2017   CC:  Chief Complaint  Patient presents with  . Hypertension    follow up   HPI:    Patient is a 66 y.o. Caucasian female who presents for 1 mo f/u hypertension that I felt was being overmedicated, so last visit I decreased her carvedolol to 1/2 of 25mg  tab bid.    Home bp monitoring all normal.  She doesn't recall heart rates.  She feels better with current bp med dosing.  No orthostatic dizziness.  Her fatigue is less.    Her kidney stone procedure at [facility] since last visit went very well.  No complication per pt report.  In 06/2016 we started her on atorva 20mg  qd for hypercholesterolemia.  Compliant with med w/out side effect. She is fasting today. At that time she also had HbA1c of 6%.  I made some dietary and exercise recommendations, which she has tried to do.  Past Medical History:  Diagnosis Date  . Alcoholism in remission [facility])    states last alcohol 11 yrs ago  . Anxiety   . Asymptomatic gallstones   . Bipolar 1 disorder [facility])    sees a psychiatrist  . Cervical spondylosis without myelopathy 12/19/2013   MRI 02/2014: multilevel DDD/spondylosis, not much change compared to prior MRI.  Pt not in favor of invasive therapy for her neck as of 04/2014.  Marland Kitchen Chronic renal insufficiency, stage II (mild)   . COPD (chronic obstructive pulmonary disease) ([place])   . Environmental allergies   . Fibromyalgia   . GERD (gastroesophageal reflux disease)   . Hiatal hernia   . History of adenomatous polyp of colon 04/2008; 08/2014   No high grade dysplasia: recall 5 yrs ([name], [name])  . History of basal cell carcinoma excision    NOSE  . History of Helicobacter pylori infection 04/2008   +gastric biopsy (gastritis but no metaplasia, dysplasia, or malignancy identified)  . History of TIA (transient ischemic attack)    secondary to HELLP syndrome 1994 post c/s--  residual memory loss  . Hyperlipidemia    no meds in > a decade per pt.  Recommended  statin 06/21/16.  Marland Kitchen Hypertension   . IFG (impaired fasting glucose) 06/2016   Fasting gluc 105; HbA1c at that time was 6.0%.  . Internal hemorrhoids   . Left foot pain    Hallux deformity+adhesive capsulitis+ hammertoe:  [name] recommended surgery but pt declines as of 05/2016.  . Memory loss    Abnl MRI brain and CT brain c/w chronic microvascular ischemia.  Repeat MRI brain 02/2014 stable (Dr. Mayra Reel with no plan to f/u with neuro as of 04/2014)  . Osteoporosis 05/2010   2012 'penia; 06/2015 'porosis:  prolia.  repeat DEXA 2 yrs.  As of 12/2016 pt going to discuss w/GYN whether or not to continue prolia.  . Pelvic floor dysfunction    [facility] ([name])  . Recurrent kidney stones 08/2013   Left ureteral calculus: Perc nephr--cystoscopy w/ureteroscopy + laser for stone removal.  Residual asymptomatic left renal nephrolithiasis <21mm present post-procedure.  Right sided hydronephrosis-persistent (on u/s)--urol ordered CT to further eval 08/2016: 1.6 cm nonobstructing stone lower pole left kidney, no hydro, plan for PCN extraction ([facility])  . Shortness of breath   . Sprain of neck 12/19/2013  . TMJ (temporomandibular joint disorder)    USES  MOUTH GUARD  . Vitamin D deficiency 05/2011  . Weak urinary stream 07/2016   with elevated PVR and mild  left hydronephrosis secondary to this ([facility] started flomax 0.4mg  qd for this).    Past Surgical History:  Procedure Laterality Date  . ANTERIOR CERVICAL DECOMP/DISCECTOMY FUSION  06-30-2009   C3 -- C5  AND EXPLORATION OF FUSION C5-7 W/  PLATE REMOVAL  . CERVICAL FUSION  2002   anterior C5 -- C7  . CERVICAL SPINE SURGERY  08-27-1999     C6 -- T1  LAMINECTOMY/  DISKECTOMY  . CESAREAN SECTION  1994  . COLONOSCOPY W/ POLYPECTOMY  04/2008;08/2014   2016 tubular adenoma x 1; +diverticulosis and int/ext hemorrhoids.  Recall 5 yrs  . CYSTOSCOPY WITH URETEROSCOPY AND STENT PLACEMENT Left 08/28/2013   Procedure: CYSTOSCOPY, RGP,   WITH URETEROSCOPY AND STENT REMOVAL;  Surgeon: [name], MD;  Location: [facility];  Service: Urology;  Laterality: Left;  . ESOPHAGOGASTRODUODENOSCOPY  2010; 08/2014   +Candidal esophagitis; Mild chronic gastritis w/intestinal metaplasia--NEG H pylori, neg for eosinophilic esoph  . HOLMIUM LASER APPLICATION Left 09/25/7626   Procedure: HOLMIUM LASER APPLICATION;  Surgeon: [name], MD;  Location: [facility];  Service: Urology;  Laterality: Left;  . NEGATIVE SLEEP STUDY  04-01-2007  . NEPHROLITHOTOMY Left 08/05/2013   Procedure: NEPHROLITHOTOMY PERCUTANEOUS;  Surgeon: [name], MD;  Location[facility];  Service: Urology;  Laterality: Left;  . TONSILLECTOMY AND ADENOIDECTOMY  1975  . TOTAL KNEE ARTHROPLASTY Right 2003  . ULTRASOUND EXAM,PELVIC COMPLETE ([facility] HX)  06/25/15   NORMAL (done by GYN)    Outpatient Medications Prior to Visit  Medication Sig Dispense Refill  . ADVAIR DISKUS 250-50 MCG/DOSE AEPB INHALE 1 PUFF INTO THE LUNGS 2 (TWO) TIMES DAILY. 60 each 5  . albuterol (PROAIR HFA) 108 (90 Base) MCG/ACT inhaler INHALE 2 PUFFS INTO THE LUNGS EVERY 4 (FOUR) HOURS AS NEEDED FOR WHEEZING OR SHORTNESS OF BREATH. 8.5 each 1  . ALPRAZolam (XANAX) 1 MG tablet Take 1 mg by mouth 3 (three) times daily as needed.     Marland Kitchen atorvastatin (LIPITOR) 20 MG tablet TAKE 1 TABLET (20 MG TOTAL) BY MOUTH DAILY. 30 tablet 1  . B Complex-C (B-COMPLEX WITH VITAMIN C) tablet Take 1 tablet by mouth daily.    . calcium & magnesium carbonates (MYLANTA) 315-176 MG tablet Take 1 tablet by mouth daily.    . carvedilol (COREG) 25 MG tablet 1/2 tab po bid 60 tablet 3  . cetirizine (ZYRTEC) 10 MG tablet Take 10 mg by mouth every morning.     . chlorthalidone (HYGROTON) 25 MG tablet TAKE 1 TABLET (25 MG TOTAL) BY MOUTH DAILY. 30 tablet 5  . cyclobenzaprine (FLEXERIL) 10 MG tablet Take 10 mg by mouth 2 (two) times daily as needed.     . fluticasone (CUTIVATE) 0.05 % cream     .  fluticasone (FLONASE) 50 MCG/ACT nasal spray Place 2 sprays into both nostrils daily. 16 g 11  . INCRUSE ELLIPTA 62.5 MCG/INH AEPB INHALE 1 PUFF INTO THE LUNGS DAILY. 30 each 0  . ketoconazole (NIZORAL) 2 % cream     . lamoTRIgine (LAMICTAL) 100 MG tablet Take 3 tablets (300 mg total) by mouth at bedtime. 90 tablet 11  . lisinopril (PRINIVIL,ZESTRIL) 40 MG tablet TAKE 1 TABLET (40 MG TOTAL) BY MOUTH DAILY. 30 tablet 5  . montelukast (SINGULAIR) 10 MG tablet Take 1 tablet (10 mg total) by mouth every evening. 30 tablet 12  . Multiple Vitamin (MULTIVITAMIN) tablet Take 1 tablet by mouth daily.    . Oxycodone  HCl 10 MG TABS Take 1 tablet by mouth 3 (three) times daily.    . polyethylene glycol powder (GLYCOLAX/MIRALAX) powder Take 17 g by mouth daily. 3162 g 12  . ranitidine (ZANTAC) 150 MG capsule Take 150 mg by mouth 2 (two) times daily as needed.     . venlafaxine XR (EFFEXOR-XR) 150 MG 24 hr capsule Take 1 capsule (150 mg total) by mouth daily with breakfast. 30 capsule 3  . zolpidem (AMBIEN) 10 MG tablet Take 10 mg by mouth at bedtime.     No facility-administered medications prior to visit.     Allergies  Allergen Reactions  . Ceftin [Cefuroxime Axetil] Anaphylaxis    Swelling of eyes and tongue  . Ciprofloxacin Anaphylaxis    Difficulty breathing and generalized swelling  . Fosamax [Alendronate Sodium] Diarrhea  . Morphine And Related Other (See Comments)    Hallucinations/ disoriented  . Prednisone Other (See Comments)    Hyperactivity    ROS As per HPI  PE: Blood pressure 128/66, pulse 79, temperature 97.9 F (36.6 C), temperature source Oral, resp. rate 16, weight 168 lb (76.2 kg), SpO2 95 %. Gen: Alert, well appearing.  Patient is oriented to person, place, time, and situation. AFFECT: pleasant, lucid thought and speech. No further exam today.  LABS:  Lab Results  Component Value Date   TSH 1.38 06/17/2016   Lab Results  Component Value Date   WBC 6.5 06/17/2016    HGB 12.1 06/17/2016   HCT 35.2 (L) 06/17/2016   MCV 87.4 06/17/2016   PLT 270.0 06/17/2016   Lab Results  Component Value Date   CREATININE 0.93 01/16/2017   BUN 14 01/16/2017   NA 132 (L) 01/16/2017   K 4.1 01/16/2017   CL 92 (L) 01/16/2017   CO2 34 (H) 01/16/2017   Lab Results  Component Value Date   ALT 12 06/17/2016   AST 14 06/17/2016   ALKPHOS 43 06/17/2016   BILITOT 0.5 06/17/2016   Lab Results  Component Value Date   CHOL 140 01/16/2017   Lab Results  Component Value Date   HDL 49.50 01/16/2017   Lab Results  Component Value Date   LDLCALC 70 01/16/2017   Lab Results  Component Value Date   TRIG 102.0 01/16/2017   Lab Results  Component Value Date   CHOLHDL 3 01/16/2017   Lab Results  Component Value Date   HGBA1C 5.8 01/16/2017    IMPRESSION AND PLAN:  1) HTN: recent low bp's due to being overmedicated. BP better and pt feels better on current bp med dosing of 1/2 of 25mg  coreg bid, hygroton 25mg  qd, and lisinopril 40mg  qd. Lytes/cr today.  2) Hypercholesterolemia: tolerating statin started 6 mo ago. REcheck FLP today.  3) Prediabetes: she is fasting today--check fasting glucose as well as repeat hba1c.  An After Visit Summary was printed and given to the patient.  FOLLOW UP: Return in about 6 months (around 07/17/2017) for annual CPE (fasting).  Signed:  Crissie Sickles, MD           01/16/2017

## 2017-01-18 ENCOUNTER — Other Ambulatory Visit: Payer: Self-pay | Admitting: Family Medicine

## 2017-01-18 MED ORDER — CARVEDILOL 25 MG PO TABS: ORAL_TABLET | ORAL | 1 refills | Status: DC

## 2017-01-19 DIAGNOSIS — M797 Fibromyalgia: Secondary | ICD-10-CM | POA: Diagnosis not present

## 2017-01-19 DIAGNOSIS — M150 Primary generalized (osteo)arthritis: Secondary | ICD-10-CM | POA: Diagnosis not present

## 2017-01-19 DIAGNOSIS — Z79891 Long term (current) use of opiate analgesic: Secondary | ICD-10-CM | POA: Diagnosis not present

## 2017-02-03 ENCOUNTER — Other Ambulatory Visit: Payer: Self-pay | Admitting: Family Medicine

## 2017-02-17 DIAGNOSIS — Z79891 Long term (current) use of opiate analgesic: Secondary | ICD-10-CM | POA: Diagnosis not present

## 2017-02-17 DIAGNOSIS — M797 Fibromyalgia: Secondary | ICD-10-CM | POA: Diagnosis not present

## 2017-02-17 DIAGNOSIS — M150 Primary generalized (osteo)arthritis: Secondary | ICD-10-CM | POA: Diagnosis not present

## 2017-03-09 ENCOUNTER — Other Ambulatory Visit: Payer: Self-pay | Admitting: Pulmonary Disease

## 2017-03-15 ENCOUNTER — Other Ambulatory Visit: Payer: Self-pay | Admitting: Family Medicine

## 2017-03-16 DIAGNOSIS — L308 Other specified dermatitis: Secondary | ICD-10-CM | POA: Diagnosis not present

## 2017-03-16 DIAGNOSIS — B078 Other viral warts: Secondary | ICD-10-CM | POA: Diagnosis not present

## 2017-03-16 DIAGNOSIS — L718 Other rosacea: Secondary | ICD-10-CM | POA: Diagnosis not present

## 2017-03-16 DIAGNOSIS — L821 Other seborrheic keratosis: Secondary | ICD-10-CM | POA: Diagnosis not present

## 2017-03-21 DIAGNOSIS — Z79891 Long term (current) use of opiate analgesic: Secondary | ICD-10-CM | POA: Diagnosis not present

## 2017-03-21 DIAGNOSIS — M797 Fibromyalgia: Secondary | ICD-10-CM | POA: Diagnosis not present

## 2017-03-21 DIAGNOSIS — M150 Primary generalized (osteo)arthritis: Secondary | ICD-10-CM | POA: Diagnosis not present

## 2017-03-23 ENCOUNTER — Other Ambulatory Visit: Payer: Self-pay | Admitting: Podiatry

## 2017-03-23 ENCOUNTER — Ambulatory Visit: Payer: Medicare Other | Admitting: Pulmonary Disease

## 2017-03-23 ENCOUNTER — Ambulatory Visit: Payer: Medicare Other | Admitting: Podiatry

## 2017-03-23 ENCOUNTER — Encounter: Payer: Self-pay | Admitting: Podiatry

## 2017-03-23 ENCOUNTER — Ambulatory Visit (INDEPENDENT_AMBULATORY_CARE_PROVIDER_SITE_OTHER): Payer: Medicare Other

## 2017-03-23 DIAGNOSIS — M779 Enthesopathy, unspecified: Secondary | ICD-10-CM | POA: Diagnosis not present

## 2017-03-23 DIAGNOSIS — M79672 Pain in left foot: Secondary | ICD-10-CM

## 2017-03-23 DIAGNOSIS — M2012 Hallux valgus (acquired), left foot: Secondary | ICD-10-CM

## 2017-03-23 DIAGNOSIS — M21619 Bunion of unspecified foot: Secondary | ICD-10-CM

## 2017-03-23 DIAGNOSIS — M2042 Other hammer toe(s) (acquired), left foot: Secondary | ICD-10-CM

## 2017-03-23 NOTE — Patient Instructions (Signed)
Pre-Operative Instructions  Congratulations, you have decided to take an important step towards improving your quality of life.  You can be assured that the doctors and staff at [facility] will be with you every step of the way.  Here are some important things you should know:  1. Plan to be at the surgery center/hospital at least 1 (one) hour prior to your scheduled time, unless otherwise directed by the surgical center/hospital staff.  You must have a responsible adult accompany you, remain during the surgery and drive you home.  Make sure you have directions to the surgical center/hospital to ensure you arrive on time. 2. If you are having surgery at [facility] or [facility], you will need a copy of your medical history and physical form from your family physician within one month prior to the date of surgery. We will give you a form for your primary physician to complete.  3. We make every effort to accommodate the date you request for surgery.  However, there are times where surgery dates or times have to be moved.  We will contact you as soon as possible if a change in schedule is required.   4. No aspirin/ibuprofen for one week before surgery.  If you are on aspirin, any non-steroidal anti-inflammatory medications (Mobic, Aleve, Ibuprofen) should not be taken seven (7) days prior to your surgery.  You make take Tylenol for pain prior to surgery.  5. Medications - If you are taking daily heart and blood pressure medications, seizure, reflux, allergy, asthma, anxiety, pain or diabetes medications, make sure you notify the surgery center/hospital before the day of surgery so they can tell you which medications you should take or avoid the day of surgery. 6. No food or drink after midnight the night before surgery unless directed otherwise by surgical center/hospital staff. 7. No alcoholic beverages 24-hours prior to surgery.  No smoking 24-hours prior or 24-hours after  surgery. 8. Wear loose pants or shorts. They should be loose enough to fit over bandages, boots, and casts. 9. Don't wear slip-on shoes. Sneakers are preferred. 10. Bring your boot with you to the surgery center/hospital.  Also bring crutches or a walker if your physician has prescribed it for you.  If you do not have this equipment, it will be provided for you after surgery. 11. If you have not been contacted by the surgery center/hospital by the day before your surgery, call to confirm the date and time of your surgery. 12. Leave-time from work may vary depending on the type of surgery you have.  Appropriate arrangements should be made prior to surgery with your employer. 13. Prescriptions will be provided immediately following surgery by your doctor.  Fill these as soon as possible after surgery and take the medication as directed. Pain medications will not be refilled on weekends and must be approved by the doctor. 14. Remove nail polish on the operative foot and avoid getting pedicures prior to surgery. 15. Wash the night before surgery.  The night before surgery wash the foot and leg well with water and the antibacterial soap provided. Be sure to pay special attention to beneath the toenails and in between the toes.  Wash for at least three (3) minutes. Rinse thoroughly with water and dry well with a towel.  Perform this wash unless told not to do so by your physician.  Enclosed: 1 Ice pack (please put in freezer the night before surgery)   1 Hibiclens skin cleaner     Pre-op instructions  If you have any questions regarding the instructions, please do not hesitate to call our office.  [place]: [address], [place], [place] [address] -- [phone]  [place]: [address] [place], [place] [address] -- [phone]  [place]: [address]  [place], [place] [address] -- [phone]  [place]: [address], [address], [place], [place] [address] -- [phone]  Website:  [email or web address] 

## 2017-03-23 NOTE — Progress Notes (Signed)
Subjective:   Patient ID: Erin Lopez, female   DOB: 66 y.o.   MRN: [id number]   HPI Patient presents stating she wants to get her foot fixed in January and has been bothering her more and more as time goes on.  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  Patient wants surgery and I reviewed with her aggressive distal osteotomy along with possible Akin osteotomy and probable implant of the second MPJ or possible shortening osteotomy.  I explained at great length there is no guarantee this will solve her problems and reviewed all possible complications associated with surgery and she read consent form going over alternative treatments complications and wants surgery and signs at this time.  Patient is scheduled for outpatient surgery for especially surgical center is encouraged to call with questions and will bring her boot that she has with her  X-ray indicates significant elevation of the intermetatarsal angle of approximately 15-17 degrees with arthritis around the second MPJ left

## 2017-03-27 ENCOUNTER — Encounter: Payer: Self-pay | Admitting: Family Medicine

## 2017-04-06 ENCOUNTER — Other Ambulatory Visit: Payer: Self-pay | Admitting: Pulmonary Disease

## 2017-04-10 ENCOUNTER — Other Ambulatory Visit: Payer: Self-pay | Admitting: Family Medicine

## 2017-04-14 DIAGNOSIS — Z87442 Personal history of urinary calculi: Secondary | ICD-10-CM | POA: Diagnosis not present

## 2017-04-14 DIAGNOSIS — N200 Calculus of kidney: Secondary | ICD-10-CM | POA: Diagnosis not present

## 2017-04-21 ENCOUNTER — Ambulatory Visit: Payer: Medicare Other | Admitting: Pulmonary Disease

## 2017-04-21 DIAGNOSIS — Z87442 Personal history of urinary calculi: Secondary | ICD-10-CM | POA: Diagnosis not present

## 2017-04-21 DIAGNOSIS — M797 Fibromyalgia: Secondary | ICD-10-CM | POA: Diagnosis not present

## 2017-04-21 DIAGNOSIS — N200 Calculus of kidney: Secondary | ICD-10-CM | POA: Diagnosis not present

## 2017-04-21 DIAGNOSIS — M150 Primary generalized (osteo)arthritis: Secondary | ICD-10-CM | POA: Diagnosis not present

## 2017-04-21 DIAGNOSIS — Z79891 Long term (current) use of opiate analgesic: Secondary | ICD-10-CM | POA: Diagnosis not present

## 2017-04-25 ENCOUNTER — Encounter: Payer: Self-pay | Admitting: Adult Health

## 2017-04-25 ENCOUNTER — Ambulatory Visit: Payer: Medicare Other | Admitting: Adult Health

## 2017-04-25 ENCOUNTER — Ambulatory Visit (INDEPENDENT_AMBULATORY_CARE_PROVIDER_SITE_OTHER)
Admission: RE | Admit: 2017-04-25 | Discharge: 2017-04-25 | Disposition: A | Discharge status: Home / Self Care | Payer: Medicare Other | Source: Ambulatory Visit | Attending: Adult Health | Admitting: Adult Health

## 2017-04-25 VITALS — BP 116/66 | HR 66 | Ht 67.0 in | Wt 169.2 lb

## 2017-04-25 DIAGNOSIS — J449 Chronic obstructive pulmonary disease, unspecified: Secondary | ICD-10-CM

## 2017-04-25 DIAGNOSIS — R0602 Shortness of breath: Secondary | ICD-10-CM | POA: Diagnosis not present

## 2017-04-25 MED ORDER — UMECLIDINIUM-VILANTEROL 62.5-25 MCG/INH IN AEPB: 1.0000 | INHALATION_SPRAY | Freq: Every day | RESPIRATORY_TRACT | 0 refills | Status: DC

## 2017-04-25 NOTE — Assessment & Plan Note (Signed)
Moderate COPD with increased symptom burden.  Does not appear to be having exacerbation.  She does have a daily ongoing cough.  This may be part of her disease process also could be being aggravated by ACE inhibitor.  Check chest x-ray today.  Will change her to a combination of LABA/LAMA  Check full PFTs on return to see if progressive decline with COPD  Plan  Patient Instructions  Stop INCRUSE .  Begin ANORO 1 puff daily, rinse after use Chest x-ray today Discussed with primary care physician that lisinopril may be aggravating her cough Mucinex DM twice daily as needed for cough and congestion Follow-up in 6 weeks with Dr. Vaughan Browner with PFT and As needed   Please contact office for sooner follow up if symptoms do not improve or worsen or seek emergency care

## 2017-04-25 NOTE — Progress Notes (Signed)
@Patient  ID: Erin Lopez, female    DOB: [birth year], 67 y.o.   MRN: [id number]  Chief Complaint  Patient presents with  . Follow-up    COPD    Referring provider: Tammi Sou, MD  HPI: 67 year old female former smoker followed for moderate COPD  Test  Spirometry 06/16/15 FVC 3.01 (85%) FEV1 1.65 (60%) F/F 55. Moderate airway obstruction.   04/25/2017 Follow up : COPD  Patient returns for follow-up of COPD.  She was last seen in July 2017.  She has known moderate COPD. She is suppose to be on INCRUSE and Advair that she is only taking INCRUSE .  She complains that over the last year that she feels like her breathing is not doing as well.  She gets winded with activities.  She seems to not have as much energy walking.  She does have a daily dry cough.  She denies any productive congestion chest pain orthopnea or increased edema. She denies any hemoptysis weight loss or nausea vomiting diarrhea.  Allergies  Allergen Reactions  . Ceftin [Cefuroxime Axetil] Anaphylaxis    Swelling of eyes and tongue  . Ciprofloxacin Anaphylaxis    Difficulty breathing and generalized swelling  . Fosamax [Alendronate Sodium] Diarrhea  . Morphine And Related Other (See Comments)    Hallucinations/ disoriented - pt stated, "Only Morphine" - 03/23/17  . Prednisone Other (See Comments)    Hyperactivity    Immunization History  Administered Date(s) Administered  . Influenza Split 01/03/2015  . Influenza,inj,Quad PF,6+ Mos 12/21/2016  . Influenza-Unspecified 01/17/2014, 01/03/2016  . Pneumococcal Polysaccharide-23 02/26/2008, 08/07/2014  . Td 04/05/2003  . Tdap 08/07/2014    Past Medical History:  Diagnosis Date  . Alcoholism in remission [facility])    states last alcohol 11 yrs ago  . Anxiety   . Asymptomatic gallstones   . Bipolar 1 disorder [facility])    sees a psychiatrist  . Cervical spondylosis without myelopathy 12/19/2013   MRI 02/2014: multilevel DDD/spondylosis, not much  change compared to prior MRI.  Pt not in favor of invasive therapy for her neck as of 04/2014.  Marland Kitchen Chronic renal insufficiency, stage II (mild)   . COPD (chronic obstructive pulmonary disease) ([place])   . Environmental allergies   . Fibromyalgia   . GERD (gastroesophageal reflux disease)   . Hiatal hernia   . History of adenomatous polyp of colon 04/2008; 08/2014   No high grade dysplasia: recall 5 yrs ([name], [name])  . History of basal cell carcinoma excision    NOSE  . History of Helicobacter pylori infection 04/2008   +gastric biopsy (gastritis but no metaplasia, dysplasia, or malignancy identified)  . History of TIA (transient ischemic attack)    secondary to HELLP syndrome 1994 post c/s--  residual memory loss  . Hyperlipidemia    no meds in > a decade per pt.  Recommended statin 06/21/16.  Marland Kitchen Hypertension   . IFG (impaired fasting glucose) 06/2016   Fasting gluc 105; HbA1c at that time was 6.0%.  . Internal hemorrhoids   . 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)  . Memory loss    Abnl MRI brain and CT brain c/w chronic microvascular ischemia.  Repeat MRI brain 02/2014 stable (Dr. Mayra Reel with no plan to f/u with neuro as of 04/2014)  . Osteoporosis 05/2010  2012 'penia; 06/2015 'porosis:  prolia.  repeat DEXA 2 yrs.  As of 12/2016 pt going to discuss w/GYN whether or not to continue prolia.  . Pelvic floor dysfunction    [facility] ([name])  . Recurrent kidney stones 08/2013   Left ureteral calculus: Perc nephr--cystoscopy w/ureteroscopy + laser for stone removal.  Residual asymptomatic left renal nephrolithiasis <52mm present post-procedure.  Right sided hydronephrosis-persistent (on u/s)--urol ordered CT to further eval 08/2016: 1.6 cm nonobstructing stone lower pole left kidney, no  hydro, plan for PCN extraction ([facility])  . Shortness of breath   . Sprain of neck 12/19/2013  . TMJ (temporomandibular joint disorder)    USES  MOUTH GUARD  . Vitamin D deficiency 05/2011  . Weak urinary stream 07/2016   with elevated PVR and mild left hydronephrosis secondary to this ([facility] started flomax 0.4mg  qd for this).    Tobacco History: Social History   Tobacco Use  Smoking Status Former Smoker  . Packs/day: 1.00  . Years: 33.00  . Pack years: 33.00  . Types: Cigarettes  . Last attempt to quit: 08/22/1993  . Years since quitting: 23.6  Smokeless Tobacco Never Used   Counseling given: Not Answered   Outpatient Encounter Medications as of 04/25/2017  Medication Sig  . albuterol (PROAIR HFA) 108 (90 Base) MCG/ACT inhaler INHALE 2 PUFFS INTO THE LUNGS EVERY 4 (FOUR) HOURS AS NEEDED FOR WHEEZING OR SHORTNESS OF BREATH.  Marland Kitchen ALPRAZolam (XANAX) 1 MG tablet Take 1 mg by mouth 3 (three) times daily as needed.   Marland Kitchen atorvastatin (LIPITOR) 20 MG tablet TAKE 1 TABLET (20 MG TOTAL) BY MOUTH DAILY.  . carvedilol (COREG) 25 MG tablet 1/2 tab po bid  . cetirizine (ZYRTEC) 10 MG tablet Take 10 mg by mouth every morning.   . chlorthalidone (HYGROTON) 25 MG tablet TAKE 1 TABLET (25 MG TOTAL) BY MOUTH DAILY.  . cyclobenzaprine (FLEXERIL) 10 MG tablet Take 10 mg by mouth 2 (two) times daily as needed.   . doxycycline (VIBRAMYCIN) 50 MG capsule TAKE 1 CAP BY MOUTH UP TO TWICE A DAY AS NEEDED WITH FOOD OR WATER, CAREFUL WITH SUN EXPOSURE  . fluticasone (FLONASE) 50 MCG/ACT nasal spray Place 2 sprays into both nostrils daily.  . INCRUSE ELLIPTA 62.5 MCG/INH AEPB TAKE 1 PUFF BY MOUTH EVERY DAY  . lamoTRIgine (LAMICTAL) 100 MG tablet Take 3 tablets (300 mg total) by mouth at bedtime.  Marland Kitchen lisinopril (PRINIVIL,ZESTRIL) 40 MG tablet TAKE 1 TABLET (40 MG TOTAL) BY MOUTH DAILY.  . montelukast (SINGULAIR) 10 MG tablet TAKE 1 TABLET (10 MG TOTAL) BY MOUTH EVERY EVENING.  . Multiple Vitamin  (MULTIVITAMIN) tablet Take 1 tablet by mouth daily.  . Oxycodone HCl 10 MG TABS Take 1 tablet by mouth 3 (three) times daily.  . polyethylene glycol powder (GLYCOLAX/MIRALAX) powder Take 17 g by mouth daily.  . ranitidine (ZANTAC) 150 MG capsule Take 150 mg by mouth 2 (two) times daily as needed.   . venlafaxine XR (EFFEXOR-XR) 150 MG 24 hr capsule TAKE ONE CAPSULE BY MOUTH EVERY DAY WITH BREAKFAST  . zolpidem (AMBIEN) 10 MG tablet Take 10 mg by mouth at bedtime.  . [DISCONTINUED] ADVAIR DISKUS 250-50 MCG/DOSE AEPB INHALE 1 PUFF INTO THE LUNGS 2 (TWO) TIMES DAILY.  . [DISCONTINUED] calcium & magnesium carbonates (MYLANTA) 324-401 MG tablet Take 1 tablet by mouth daily.  . B Complex-C (B-COMPLEX WITH VITAMIN C) tablet Take 1 tablet by mouth daily.  Marland Kitchen umeclidinium-vilanterol (ANORO ELLIPTA) 62.5-25 MCG/INH  AEPB Inhale 1 puff into the lungs daily.  . [DISCONTINUED] fluticasone (CUTIVATE) 0.05 % cream   . [DISCONTINUED] ketoconazole (NIZORAL) 2 % cream    No facility-administered encounter medications on file as of 04/25/2017.      Review of Systems  Constitutional:   No  weight loss, night sweats,  Fevers, chills, + fatigue, or  lassitude.  HEENT:   No headaches,  Difficulty swallowing,  Tooth/dental problems, or  Sore throat,                No sneezing, itching, ear ache, nasal congestion, post nasal drip,   CV:  No chest pain,  Orthopnea, PND, swelling in lower extremities, anasarca, dizziness, palpitations, syncope.   GI  No heartburn, indigestion, abdominal pain, nausea, vomiting, diarrhea, change in bowel habits, loss of appetite, bloody stools.   Resp:No coughing up of blood.  No change in color of mucus.  No wheezing.  No chest wall deformity  Skin: no rash or lesions.  GU: no dysuria, change in color of urine, no urgency or frequency.  No flank pain, no hematuria   MS:  No joint pain or swelling.  No decreased range of motion.  No back pain.    Physical Exam  BP 116/66  (BP Location: Right Arm, Cuff Size: Normal)   Pulse 66   Ht 5\' 7"  (1.702 m)   Wt 169 lb 3.2 oz (76.7 kg)   SpO2 95%   BMI 26.50 kg/m   GEN: A/Ox3; pleasant , NAD   HEENT:  [place]/AT,  EACs-clear, TMs-wnl, NOSE-clear, THROAT-clear, no lesions, no postnasal drip or exudate noted.   NECK:  Supple w/ fair ROM; no JVD; normal carotid impulses w/o bruits; no thyromegaly or nodules palpated; no lymphadenopathy.    RESP  Clear  P & A; w/o, wheezes/ rales/ or rhonchi. no accessory muscle use, no dullness to percussion  CARD:  RRR, no m/r/g, no peripheral edema, pulses intact, no cyanosis or clubbing.  GI:   Soft & nt; nml bowel sounds; no organomegaly or masses detected.   Musco: Warm bil, no deformities or joint swelling noted.   Neuro: alert, no focal deficits noted.    Skin: Warm, no lesions or rashes    Lab Results:  CBC   BNP No results found for: BNP  Imaging: No results found.   Assessment & Plan:   COPD (chronic obstructive pulmonary disease) Moderate COPD with increased symptom burden.  Does not appear to be having exacerbation.  She does have a daily ongoing cough.  This may be part of her disease process also could be being aggravated by ACE inhibitor.  Check chest x-ray today.  Will change her to a combination of LABA/LAMA  Check full PFTs on return to see if progressive decline with COPD  Plan  Patient Instructions  Stop INCRUSE .  Begin ANORO 1 puff daily, rinse after use Chest x-ray today Discussed with primary care physician that lisinopril may be aggravating her cough Mucinex DM twice daily as needed for cough and congestion Follow-up in 6 weeks with Dr. Vaughan Browner with PFT and As needed   Please contact office for sooner follow up if symptoms do not improve or worsen or seek emergency care          Rexene Edison, NP 04/25/2017

## 2017-04-25 NOTE — Progress Notes (Signed)
67 y/o F PMH: HTN, COPD, CKD stage 2,  Pre-diabetes, former smoker quit in 1995.  Presents today with complaints of increased SOB with activity over the past 7 months that has worsened over the past 2 months.  States she is now asking her daughter to take the trash out because it takes a lot out of her.  Taking albuterol as needed, last dose was last week.  Taking Incruse Ellipta as scheduled and is no longer taking Advair because she did not need it.  Endorses SOB with exertion, and wheezing at times when she is SOB, increasing fatigue, chronic nonproductive cough mostly in the mornings.  Denies chest pain, palpitations, fever or chills.

## 2017-04-25 NOTE — Patient Instructions (Signed)
Stop INCRUSE .  Begin ANORO 1 puff daily, rinse after use Chest x-ray today Discussed with primary care physician that lisinopril may be aggravating her cough Mucinex DM twice daily as needed for cough and congestion Follow-up in 6 weeks with Dr. Vaughan Browner with PFT and As needed   Please contact office for sooner follow up if symptoms do not improve or worsen or seek emergency care

## 2017-04-26 ENCOUNTER — Ambulatory Visit: Payer: Medicare Other | Admitting: Obstetrics & Gynecology

## 2017-04-26 ENCOUNTER — Telehealth: Payer: Self-pay | Admitting: Adult Health

## 2017-04-26 ENCOUNTER — Encounter: Payer: Self-pay | Admitting: Obstetrics & Gynecology

## 2017-04-26 ENCOUNTER — Ambulatory Visit (INDEPENDENT_AMBULATORY_CARE_PROVIDER_SITE_OTHER): Payer: Medicare Other

## 2017-04-26 VITALS — BP 120/70

## 2017-04-26 DIAGNOSIS — N882 Stricture and stenosis of cervix uteri: Secondary | ICD-10-CM | POA: Diagnosis not present

## 2017-04-26 DIAGNOSIS — N719 Inflammatory disease of uterus, unspecified: Secondary | ICD-10-CM | POA: Diagnosis not present

## 2017-04-26 DIAGNOSIS — N898 Other specified noninflammatory disorders of vagina: Secondary | ICD-10-CM

## 2017-04-26 DIAGNOSIS — N950 Postmenopausal bleeding: Secondary | ICD-10-CM

## 2017-04-26 LAB — WET PREP FOR TRICH, YEAST, CLUE

## 2017-04-26 MED ORDER — METRONIDAZOLE 250 MG PO TABS: 500.0000 mg | ORAL_TABLET | Freq: Two times a day (BID) | ORAL | 0 refills | Status: DC

## 2017-04-26 NOTE — Progress Notes (Signed)
    Erin Lopez July 06, 1950 [id number]        67 y.o.  G3P1A2 Widowed.  Abstinent  RP:  Mild postmenopausal bleeding last night  HPI:  Pelvic pain/cramping x 3 weeks.  Left Percutaneous nephrolithotomy 01/05/2017.  Urology visit 04/21/2017 U/A normal and [place] 04/14/2017 showed no hydronephrosis and no evidence of renal calculi.  Bowel movements normal.  Past medical history,surgical history, problem list, medications, allergies, family history and social history were all reviewed and documented in the EPIC chart.  Directed ROS with pertinent positives and negatives documented in the history of present illness/assessment and plan.  Exam:  Vitals:   04/26/17 1134  BP: 120/70   General appearance:  Normal  Gynecologic exam: Vulva normal.  Speculum exam: Cervix and vagina normal.  No lesions on cervix, in particular no polyp.  No current bleeding.  Bimanual exam: Anteverted uterus, normal volume, mobile, nontender.  No adnexal mass felt, nontender.  Pelvic US today: T/V images.  Uterus anteverted and homogeneous measuring 4.25 x 3.39 x 1.74 cm.  Endometrial lining measured at 1.4 mm, but fluid-filled measuring 1.0 x 0.2 cm and 2 solid calcifications are present in the endometrial wall measuring 3 mm and 3 x 4 mm.  Left ovary normal.  Right ovary with a thick-walled collapsed small cyst measuring 8 x 5 mm.  Negative color flow Doppler.  No free fluid in posterior cul-de-sac.  Attempted EBx.  Severe cervical stenosis.  Dilation of cervix impossible.  (Menopausal x many years and h/o LEEP per patient)  Wet prep:  No Yeast or BV, but Bacteria TNTC   Assessment/Plan:  67 y.o. G3P1   1. Postmenopausal bleeding Very mild postmenopausal bleeding.  Thin endometrial lining at 1.4 mm.  Small amount of fluid in the intrauterine cavity probably secondary to the cervical stenosis.  Endometrial biopsy attempted but not possible because of the cervical stenosis.  Decision to observe and repeat a pelvic  ultrasound in 3 months.  Patient understands and agrees with plan. - US Transvaginal Non-OB - US Transvaginal Non-OB; Future  2. Vaginal discharge Given the large amount of bacteria is on the wet prep and the patient's pelvic discomfort, the decision was made to treat for the possibility of endometritis with metronidazole 250 mg tablet 1 tablet twice a day for 7 days. - WET PREP FOR [place], YEAST, CLUE  3. Cervical stenosis (uterine cervix) Secondary to history of LEEP and long-standing menopause.  4. Endometritis Pelvic discomfort with small amount of fluid in the intrauterine cavity and bacteria too numerous to count on the wet prep.  Decision to treat with metronidazole for 7 days.  Other orders - metroNIDAZOLE (FLAGYL) 250 MG tablet; Take 1 tab by mouth 2 (two) times daily for 7 days.  Counseling on above issues more than 50% for 25 minutes.  Princess Bruins MD, 11:47 AM 04/26/2017

## 2017-04-26 NOTE — Telephone Encounter (Signed)
  Pt is aware of results and voiced her understanding. Nothing further is needed.   Notes recorded by Melvenia Needles, NP on 04/26/2017 at 12:39 PM EST CXR shows COPD changes  Cont w/ ov recs , follow up for PFT as planned  Please contact office for sooner follow up if symptoms do not improve or worsen or seek emergency care

## 2017-04-27 ENCOUNTER — Telehealth: Payer: Self-pay | Admitting: *Deleted

## 2017-04-27 NOTE — Telephone Encounter (Signed)
Flagyl 250 mg/tab 1 tab PO BID x 7 days.  #14, no refill.

## 2017-04-27 NOTE — Telephone Encounter (Signed)
Patient was prescribed Flagyl 250 mg at [place] 04/26/17. The pharmacist would like clarification qty  should Rx be Flagyl 250 1 po twice daily x 7 days #14 tablet? Or will pt take 2 tablet by mouth twice daily x 7 days # 28 tablet?  Please advise

## 2017-04-28 MED ORDER — METRONIDAZOLE 250 MG PO TABS: ORAL_TABLET | ORAL | 0 refills | Status: DC

## 2017-04-28 NOTE — Telephone Encounter (Signed)
Pt informed, Rx sent. 

## 2017-04-28 NOTE — Telephone Encounter (Signed)
Left message for pt to call.

## 2017-04-30 ENCOUNTER — Encounter: Payer: Self-pay | Admitting: Obstetrics & Gynecology

## 2017-04-30 NOTE — Patient Instructions (Signed)
1. Postmenopausal bleeding Very mild postmenopausal bleeding.  Thin endometrial lining at 1.4 mm.  Small amount of fluid in the intrauterine cavity probably secondary to the cervical stenosis.  Endometrial biopsy attempted but not possible because of the cervical stenosis.  Decision to observe and repeat a pelvic ultrasound in 3 months.  Patient understands and agrees with plan. - US Transvaginal Non-OB - US Transvaginal Non-OB; Future  2. Vaginal discharge Given the large amount of bacteria is on the wet prep and the patient's pelvic discomfort, the decision was made to treat for the possibility of endometritis with metronidazole 250 mg tablet 1 tablet twice a day for 7 days. - WET PREP FOR [place], YEAST, CLUE  3. Cervical stenosis (uterine cervix) Secondary to history of LEEP and long-standing menopause.  4. Endometritis Pelvic discomfort with small amount of fluid in the intrauterine cavity and bacteria too numerous to count on the wet prep.  Decision to treat with metronidazole for 7 days.  Other orders - metroNIDAZOLE (FLAGYL) 250 MG tablet; Take 1 tab by mouth 2 (two) times daily for 7 days.  Erin Lopez, it was a pleasure meeting you today!

## 2017-05-02 ENCOUNTER — Encounter: Payer: Self-pay | Admitting: Podiatry

## 2017-05-02 DIAGNOSIS — M205X2 Other deformities of toe(s) (acquired), left foot: Secondary | ICD-10-CM | POA: Diagnosis not present

## 2017-05-02 DIAGNOSIS — E7800 Pure hypercholesterolemia, unspecified: Secondary | ICD-10-CM | POA: Diagnosis not present

## 2017-05-02 DIAGNOSIS — M21612 Bunion of left foot: Secondary | ICD-10-CM | POA: Diagnosis not present

## 2017-05-02 DIAGNOSIS — M19072 Primary osteoarthritis, left ankle and foot: Secondary | ICD-10-CM | POA: Diagnosis not present

## 2017-05-02 DIAGNOSIS — M21542 Acquired clubfoot, left foot: Secondary | ICD-10-CM | POA: Diagnosis not present

## 2017-05-02 DIAGNOSIS — M25572 Pain in left ankle and joints of left foot: Secondary | ICD-10-CM | POA: Diagnosis not present

## 2017-05-02 DIAGNOSIS — M2012 Hallux valgus (acquired), left foot: Secondary | ICD-10-CM | POA: Diagnosis not present

## 2017-05-02 DIAGNOSIS — M2042 Other hammer toe(s) (acquired), left foot: Secondary | ICD-10-CM | POA: Diagnosis not present

## 2017-05-04 ENCOUNTER — Other Ambulatory Visit: Payer: Self-pay | Admitting: Pulmonary Disease

## 2017-05-05 ENCOUNTER — Telehealth: Payer: Self-pay | Admitting: Adult Health

## 2017-05-05 DIAGNOSIS — N719 Inflammatory disease of uterus, unspecified: Secondary | ICD-10-CM

## 2017-05-05 DIAGNOSIS — N950 Postmenopausal bleeding: Secondary | ICD-10-CM

## 2017-05-05 HISTORY — DX: Inflammatory disease of uterus, unspecified: N71.9

## 2017-05-05 HISTORY — DX: Postmenopausal bleeding: N95.0

## 2017-05-05 MED ORDER — UMECLIDINIUM-VILANTEROL 62.5-25 MCG/INH IN AEPB: 1.0000 | INHALATION_SPRAY | Freq: Every day | RESPIRATORY_TRACT | 3 refills | Status: DC

## 2017-05-05 NOTE — Telephone Encounter (Signed)
Called and spoke with patient  Pt advised Anoro is working well for her, would like a script sent Advised JJ with info, okay to fill script Placed order for Anoro Inhaler Nothing further needed

## 2017-05-05 NOTE — Telephone Encounter (Signed)
lmtcb x1 for pt. 

## 2017-05-08 NOTE — Progress Notes (Signed)
DOS 05/02/17 [place] bunionectomy Lt, Possible [place] osteotomy Lt, Hammertoe repair 3rd distal Lt, Met. Head resection w/ implant 2nd Lt

## 2017-05-10 ENCOUNTER — Ambulatory Visit (INDEPENDENT_AMBULATORY_CARE_PROVIDER_SITE_OTHER): Payer: Medicare Other

## 2017-05-10 ENCOUNTER — Other Ambulatory Visit: Payer: Medicare Other

## 2017-05-10 ENCOUNTER — Encounter: Payer: Self-pay | Admitting: Podiatry

## 2017-05-10 ENCOUNTER — Ambulatory Visit (INDEPENDENT_AMBULATORY_CARE_PROVIDER_SITE_OTHER): Payer: Medicare Other | Admitting: Podiatry

## 2017-05-10 VITALS — BP 156/98 | HR 74 | Temp 98.5°F

## 2017-05-10 DIAGNOSIS — M2012 Hallux valgus (acquired), left foot: Secondary | ICD-10-CM

## 2017-05-10 DIAGNOSIS — M21619 Bunion of unspecified foot: Secondary | ICD-10-CM | POA: Diagnosis not present

## 2017-05-10 DIAGNOSIS — M2042 Other hammer toe(s) (acquired), left foot: Secondary | ICD-10-CM

## 2017-05-10 MED ORDER — DOXYCYCLINE HYCLATE 100 MG PO TABS: 100.0000 mg | ORAL_TABLET | Freq: Two times a day (BID) | ORAL | 0 refills | Status: DC

## 2017-05-10 NOTE — Progress Notes (Signed)
Subjective:   Patient ID: Erin Lopez, female   DOB: 67 y.o.   MRN: [id number]   HPI Patient states she is doing well with her foot and is very satisfied with the results with some redness in the big toe but overall having minimal discomfort at the current time   ROS      Objective:  Physical Exam  Neurovascular status intact negative Homans sign was noted with patient's left first metatarsal healing well with good alignment wound edges well coapted left hallux healing well with mild redness in the toe that is localized with no drainage or odor with good alignment of the toe in good alignment of the fifth metatarsal second metatarsal     Assessment:  Doing well overall with slight excess of redness over what I expect big toe but no active drainage or other signs of pathology F2 H&P x-rays reviewed with patient and reapplied sterile dressing.  Gave instructions on continued elevation compression and is precautionary measure placed on doxycycline 100 mg twice daily as she does have some redness in the big toe even though I do not think there is an infective process going.  Gave strict instructions to call us immediately if any increasing pain swelling or drainage were to occur     Plan:  Reviewed above with patient and reviewed x-rays indicating the osteotomies are healing well with good alignment noted and good implant second MPJ left

## 2017-05-11 ENCOUNTER — Ambulatory Visit: Payer: Medicare Other | Admitting: Family Medicine

## 2017-05-11 ENCOUNTER — Encounter: Payer: Self-pay | Admitting: Family Medicine

## 2017-05-11 VITALS — BP 93/51 | HR 76 | Temp 99.6°F | Resp 16 | Ht 67.0 in | Wt 167.5 lb

## 2017-05-11 DIAGNOSIS — T8149XA Infection following a procedure, other surgical site, initial encounter: Secondary | ICD-10-CM | POA: Diagnosis not present

## 2017-05-11 DIAGNOSIS — B9789 Other viral agents as the cause of diseases classified elsewhere: Secondary | ICD-10-CM

## 2017-05-11 DIAGNOSIS — J069 Acute upper respiratory infection, unspecified: Secondary | ICD-10-CM | POA: Diagnosis not present

## 2017-05-11 MED ORDER — AMOXICILLIN-POT CLAVULANATE 875-125 MG PO TABS: 1.0000 | ORAL_TABLET | Freq: Two times a day (BID) | ORAL | 0 refills | Status: DC

## 2017-05-11 NOTE — Progress Notes (Signed)
OFFICE VISIT  05/11/2017   CC:  Chief Complaint  Patient presents with  . Sore Throat  . Ear Pain   HPI:    Patient is a 67 y.o. Caucasian female who presents for respiratory symptoms and "left foot infection". Had onset of ST and ear pain "last week".  Subjective fever on/off, taking tylenol.  Some coughing. She is hard to get history from today, she is anxious and reciting some catastrophic thoughts. Denies SOB or wheezing.  No face pain or sinus pressure.  Had bunion deformity and hammertoe surgically fixed 05/02/17, went to podiatry for f/u yesterday and all was well except there was some redness to the foot/big  toe. The podiatrist commented in his note that he pretty much expected this, but as a precautionary measure he started her on doxycycline.  Past Medical History:  Diagnosis Date  . Alcoholism in remission [facility])    states last alcohol 11 yrs ago  . Anxiety   . Asymptomatic gallstones   . Bipolar 1 disorder [facility])    sees a psychiatrist  . Cervical spondylosis without myelopathy 12/19/2013   MRI 02/2014: multilevel DDD/spondylosis, not much change compared to prior MRI.  Pt not in favor of invasive therapy for her neck as of 04/2014.  Marland Kitchen Chronic renal insufficiency, stage II (mild)   . COPD (chronic obstructive pulmonary disease) ([place])   . Environmental allergies   . Fibromyalgia   . GERD (gastroesophageal reflux disease)   . Hiatal hernia   . History of adenomatous polyp of colon 04/2008; 08/2014   No high grade dysplasia: recall 5 yrs ([name], [name])  . History of basal cell carcinoma excision    NOSE  . History of Helicobacter pylori infection 04/2008   +gastric biopsy (gastritis but no metaplasia, dysplasia, or malignancy identified)  . History of TIA (transient ischemic attack)    secondary to HELLP syndrome 1994 post c/s--  residual memory loss  . Hyperlipidemia    no meds in > a decade per pt.  Recommended statin 06/21/16.  Marland Kitchen Hypertension   . IFG  (impaired fasting glucose) 06/2016   Fasting gluc 105; HbA1c at that time was 6.0%.  . Internal hemorrhoids   . 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)  . Memory loss    Abnl MRI brain and CT brain c/w chronic microvascular ischemia.  Repeat MRI brain 02/2014 stable (Dr. Mayra Reel with no plan to f/u with neuro as of 04/2014)  . Osteoporosis 05/2010   2012 'penia; 06/2015 'porosis:  prolia.  repeat DEXA 2 yrs.  As of 12/2016 pt going to discuss w/GYN whether or not to continue prolia.  . Pelvic floor dysfunction    [facility] ([name])  . Recurrent kidney stones 08/2013   Left ureteral calculus: Perc nephr--cystoscopy w/ureteroscopy + laser for stone removal.  Residual asymptomatic left renal nephrolithiasis <72mm present post-procedure.  Right sided hydronephrosis-persistent (on u/s)--urol ordered CT to further eval 08/2016: 1.6 cm nonobstructing stone lower pole left kidney, no hydro, plan for PCN extraction ([facility])  . Shortness of breath   . Sprain of neck 12/19/2013  . TMJ (temporomandibular joint disorder)    USES  MOUTH GUARD  . Vitamin D deficiency 05/2011  . Weak urinary stream 07/2016   with elevated PVR and mild left hydronephrosis secondary to this ([facility] started flomax 0.4mg  qd for this).    Past Surgical History:  Procedure Laterality Date  . ANTERIOR CERVICAL DECOMP/DISCECTOMY FUSION  06-30-2009   C3 -- C5  AND EXPLORATION OF FUSION C5-7 W/  PLATE REMOVAL  . CERVICAL FUSION  2002   anterior C5 -- C7  . CERVICAL SPINE SURGERY  08-27-1999     C6 -- T1  LAMINECTOMY/  DISKECTOMY  . CESAREAN SECTION  1994  . COLONOSCOPY W/ POLYPECTOMY  04/2008;08/2014   2016 tubular adenoma x 1; +diverticulosis and int/ext hemorrhoids.  Recall 5 yrs  . CYSTOSCOPY WITH URETEROSCOPY AND STENT  PLACEMENT Left 08/28/2013   Procedure: CYSTOSCOPY, RGP,  WITH URETEROSCOPY AND STENT REMOVAL;  Surgeon: [name], MD;  Location: [facility];  Service: Urology;  Laterality: Left;  . ESOPHAGOGASTRODUODENOSCOPY  2010; 08/2014   +Candidal esophagitis; Mild chronic gastritis w/intestinal metaplasia--NEG H pylori, neg for eosinophilic esoph  . HOLMIUM LASER APPLICATION Left 12/03/5174   Procedure: HOLMIUM LASER APPLICATION;  Surgeon: [name], MD;  Location: [facility];  Service: Urology;  Laterality: Left;  . NEGATIVE SLEEP STUDY  04-01-2007  . NEPHROLITHOTOMY Left 08/05/2013   Procedure: NEPHROLITHOTOMY PERCUTANEOUS;  Surgeon: [name], MD;  Location: [facility];  Service: Urology;  Laterality: Left;  . TONSILLECTOMY AND ADENOIDECTOMY  1975  . TOTAL KNEE ARTHROPLASTY Right 2003  . ULTRASOUND EXAM,PELVIC COMPLETE ([facility])  06/25/15   NORMAL (done by GYN)    Outpatient Medications Prior to Visit  Medication Sig Dispense Refill  . albuterol (PROAIR HFA) 108 (90 Base) MCG/ACT inhaler INHALE 2 PUFFS INTO THE LUNGS EVERY 4 (FOUR) HOURS AS NEEDED FOR WHEEZING OR SHORTNESS OF BREATH. 8.5 each 1  . ALPRAZolam (XANAX) 1 MG tablet Take 1 mg by mouth 3 (three) times daily as needed.     Marland Kitchen atorvastatin (LIPITOR) 20 MG tablet TAKE 1 TABLET (20 MG TOTAL) BY MOUTH DAILY. 30 tablet 5  . B Complex-C (B-COMPLEX WITH VITAMIN C) tablet Take 1 tablet by mouth daily.    . carvedilol (COREG) 25 MG tablet 1/2 tab po bid 90 tablet 1  . cetirizine (ZYRTEC) 10 MG tablet Take 10 mg by mouth every morning.     . chlorthalidone (HYGROTON) 25 MG tablet TAKE 1 TABLET (25 MG TOTAL) BY MOUTH DAILY. 30 tablet 5  . cyclobenzaprine (FLEXERIL) 10 MG tablet Take 10 mg by mouth 2 (two) times daily as needed.     . doxycycline (VIBRA-TABS) 100 MG tablet Take 1 tablet (100 mg total) by mouth 2 (two) times daily. 20 tablet 0  . fluticasone (FLONASE) 50 MCG/ACT nasal spray Place 2 sprays into both  nostrils daily. 16 g 11  . lamoTRIgine (LAMICTAL) 100 MG tablet Take 3 tablets (300 mg total) by mouth at bedtime. 90 tablet 11  . lisinopril (PRINIVIL,ZESTRIL) 40 MG tablet TAKE 1 TABLET (40 MG TOTAL) BY MOUTH DAILY. 30 tablet 5  . montelukast (SINGULAIR) 10 MG tablet TAKE 1 TABLET (10 MG TOTAL) BY MOUTH EVERY EVENING. 30 tablet 12  . Multiple Vitamin (MULTIVITAMIN) tablet Take 1 tablet by mouth daily.    . Oxycodone HCl 10 MG TABS Take 1 tablet by mouth 3 (three) times daily.    . polyethylene glycol powder (GLYCOLAX/MIRALAX) powder Take 17 g by mouth daily. 3162 g 12  . ranitidine (ZANTAC) 150 MG capsule Take 150 mg by mouth 2 (two) times daily as needed.     . umeclidinium-vilanterol (ANORO ELLIPTA) 62.5-25 MCG/INH  AEPB Inhale 1 puff into the lungs daily. 1 each 3  . venlafaxine XR (EFFEXOR-XR) 150 MG 24 hr capsule TAKE ONE CAPSULE BY MOUTH EVERY DAY WITH BREAKFAST 30 capsule 5  . zolpidem (AMBIEN) 10 MG tablet Take 10 mg by mouth at bedtime.    Marland Kitchen doxycycline (VIBRAMYCIN) 50 MG capsule TAKE 1 CAP BY MOUTH UP TO TWICE A DAY AS NEEDED WITH FOOD OR WATER, CAREFUL WITH SUN EXPOSURE  1  . INCRUSE ELLIPTA 62.5 MCG/INH AEPB TAKE 1 PUFF BY MOUTH EVERY DAY (Patient not taking: Reported on 05/11/2017) 30 each 0  . metroNIDAZOLE (FLAGYL) 250 MG tablet Take one tablet by mouth twice daily for 7 days (Patient not taking: Reported on 05/11/2017) 14 tablet 0  . umeclidinium-vilanterol (ANORO ELLIPTA) 62.5-25 MCG/INH AEPB Inhale 1 puff into the lungs daily. (Patient not taking: Reported on 05/11/2017) 1 each 0   No facility-administered medications prior to visit.     Allergies  Allergen Reactions  . Ceftin [Cefuroxime Axetil] Anaphylaxis    Swelling of eyes and tongue  . Ciprofloxacin Anaphylaxis    Difficulty breathing and generalized swelling  . Fosamax [Alendronate Sodium] Diarrhea  . Morphine And Related Other (See Comments)    Hallucinations/ disoriented - pt stated, "Only Morphine" - 03/23/17  .  Prednisone Other (See Comments)    Hyperactivity    ROS As per HPI  PE: Blood pressure (!) 93/51, pulse 76, temperature 99.6 F (37.6 C), temperature source Temporal, resp. rate 16, height 5\' 7"  (1.702 m), weight 167 lb 8 oz (76 kg), SpO2 93 %. VS: noted--normal. Gen: alert, NAD, NONTOXIC APPEARING. HEENT: eyes without injection, drainage, or swelling.  Ears: EACs clear, TMs with normal light reflex and landmarks.  Nose: Clear rhinorrhea, with some dried, crusty exudate adherent to mildly injected mucosa.  No purulent d/c.  No paranasal sinus TTP.  No facial swelling.  Throat and mouth without focal lesion.  No pharyngial swelling, erythema, or exudate.   Neck: supple, no LAD.   LUNGS: CTA bilat, nonlabored resps.   CV: RRR, no m/r/g. EXT: left foot shows intact surgical wounds w/out drainage.  Generalized swelling of L foot noted, particularly the big toe and around her surgical incisions---with erythema in the same distribution.  +Warmth. N/V intact.   SKIN: no rash    LABS:    Chemistry      Component Value Date/Time   NA 132 (L) 01/16/2017 1148   K 4.1 01/16/2017 1148   CL 92 (L) 01/16/2017 1148   CO2 34 (H) 01/16/2017 1148   BUN 14 01/16/2017 1148   CREATININE 0.93 01/16/2017 1148   CREATININE 1.27 (H) 05/16/2016 1525      Component Value Date/Time   CALCIUM 9.9 01/16/2017 1148   ALKPHOS 43 06/17/2016 1038   AST 14 06/17/2016 1038   ALT 12 06/17/2016 1038   BILITOT 0.5 06/17/2016 1038       IMPRESSION AND PLAN:  1) Viral URI with cough. Get otc generic robitussin DM OR Mucinex DM and use as directed on the packaging for cough and congestion. Use otc generic saline nasal spray 2-3 times per day to irrigate/moisturize your nasal passages.  2) Left foot post-surgical cellulitis suspected: she certainly has some expected post-op swelling, redness, and pain, but I think she has findings severe enough to treat as infection: I had her change doxy to augmentin. I  confirmed with pt today that she has taken this med in the past w/out any reaction.  An  After Visit Summary was printed and given to the patient.  FOLLOW UP: Return in about 10 days (around 05/21/2017).recheck foot.  Signed:  Crissie Sickles, MD           05/11/2017

## 2017-05-11 NOTE — Patient Instructions (Signed)
Get otc generic robitussin DM OR Mucinex DM and use as directed on the packaging for cough and congestion. Use otc generic saline nasal spray 2-3 times per day to irrigate/moisturize your nasal passages.   

## 2017-05-15 ENCOUNTER — Encounter: Payer: Self-pay | Admitting: Family Medicine

## 2017-05-16 ENCOUNTER — Telehealth: Payer: Self-pay | Admitting: Podiatry

## 2017-05-16 NOTE — Telephone Encounter (Signed)
Called pt to confirm her appointment is today at 10:15 am. Pt stated the other lady she spoke to said that her appointment was for tomorrow. I apologized and said that is correct that it is tomorrow at 10:15 am as I was thinking today was Wednesday. I apologized to the pt for the miscommunication.

## 2017-05-16 NOTE — Telephone Encounter (Signed)
I was calling to verify an appointment today at 10:00 am. I need to make sure I have that correct. I would appreciate if you would give me a call back at [phone]. Please call me back as soon as possible. Thanks. Bye.

## 2017-05-17 ENCOUNTER — Ambulatory Visit (INDEPENDENT_AMBULATORY_CARE_PROVIDER_SITE_OTHER): Payer: Medicare Other | Admitting: Podiatry

## 2017-05-17 ENCOUNTER — Encounter: Payer: Self-pay | Admitting: Podiatry

## 2017-05-17 DIAGNOSIS — M2042 Other hammer toe(s) (acquired), left foot: Secondary | ICD-10-CM

## 2017-05-17 DIAGNOSIS — M21619 Bunion of unspecified foot: Secondary | ICD-10-CM

## 2017-05-17 DIAGNOSIS — M2012 Hallux valgus (acquired), left foot: Secondary | ICD-10-CM

## 2017-05-17 NOTE — Progress Notes (Signed)
Subjective:   Patient ID: Erin Lopez, female   DOB: 67 y.o.   MRN: [id number]   HPI Patient presents stating that overall is doing very well with discomfort if she is walking too much   ROS      Objective:  Physical Exam  Neurovascular status intact negative Homans sign was noted with patient's foot overall doing well with good alignment of the metatarsal digit     Assessment:  Doing well overall foot surgery left     Plan:  H&P condition reviewed sterile dressing reapplied advised to continue elevation range of motion exercises question and reappoint 4 weeks or earlier if necessary  X-ray indicates osteotomy is healing well good positional alignment no signs of pathology

## 2017-05-22 ENCOUNTER — Ambulatory Visit: Payer: Medicare Other | Admitting: Family Medicine

## 2017-05-22 DIAGNOSIS — M797 Fibromyalgia: Secondary | ICD-10-CM | POA: Diagnosis not present

## 2017-05-22 DIAGNOSIS — Z79891 Long term (current) use of opiate analgesic: Secondary | ICD-10-CM | POA: Diagnosis not present

## 2017-05-22 DIAGNOSIS — M150 Primary generalized (osteo)arthritis: Secondary | ICD-10-CM | POA: Diagnosis not present

## 2017-05-24 ENCOUNTER — Encounter: Payer: Self-pay | Admitting: Podiatry

## 2017-05-24 ENCOUNTER — Ambulatory Visit (INDEPENDENT_AMBULATORY_CARE_PROVIDER_SITE_OTHER): Payer: Medicare Other

## 2017-05-24 ENCOUNTER — Other Ambulatory Visit: Payer: Medicare Other

## 2017-05-24 ENCOUNTER — Ambulatory Visit (INDEPENDENT_AMBULATORY_CARE_PROVIDER_SITE_OTHER): Payer: Medicare Other | Admitting: Podiatry

## 2017-05-24 DIAGNOSIS — M2042 Other hammer toe(s) (acquired), left foot: Secondary | ICD-10-CM

## 2017-05-24 DIAGNOSIS — R600 Localized edema: Secondary | ICD-10-CM

## 2017-05-24 DIAGNOSIS — M21619 Bunion of unspecified foot: Secondary | ICD-10-CM

## 2017-05-24 DIAGNOSIS — M2012 Hallux valgus (acquired), left foot: Secondary | ICD-10-CM | POA: Diagnosis not present

## 2017-05-24 NOTE — Progress Notes (Signed)
Subjective:   Patient ID: Erin Lopez, female   DOB: 67 y.o.   MRN: [id number]   HPI Patient states doing very well with her left foot with minimal discomfort currently and able to walk without pain   ROS      Objective:  Physical Exam  Neurovascular status intact with patient's left foot healing well with wound edges well coapted no drainage noted incision sites in good alignment and hallux in good alignment with moderate swelling especially into the foot and ankle.  Patient has a negative Homans sign and appears to be localized as far as the swelling goes     Assessment:  Patient is improving from having extensive forefoot surgery left with moderate swelling noted     Plan:  X-ray reviewed advised to continue elevation and I did apply an Unna boot for consistent compression and discussed continued immobilization.  Reappoint in the next week and was given advice on removing the Unna boot 3-4 days or earlier if any issues should occur  X-rays indicate the osteotomies are healing well with fixation in place alignment good

## 2017-05-31 ENCOUNTER — Ambulatory Visit (INDEPENDENT_AMBULATORY_CARE_PROVIDER_SITE_OTHER): Payer: Medicare Other | Admitting: Podiatry

## 2017-05-31 DIAGNOSIS — M21619 Bunion of unspecified foot: Secondary | ICD-10-CM

## 2017-05-31 DIAGNOSIS — M2012 Hallux valgus (acquired), left foot: Secondary | ICD-10-CM

## 2017-05-31 DIAGNOSIS — M2042 Other hammer toe(s) (acquired), left foot: Secondary | ICD-10-CM

## 2017-05-31 MED ORDER — DOXYCYCLINE HYCLATE 100 MG PO TABS: 100.0000 mg | ORAL_TABLET | Freq: Two times a day (BID) | ORAL | 0 refills | Status: DC

## 2017-05-31 NOTE — Progress Notes (Signed)
Subjective:   Patient ID: Erin Lopez, female   DOB: 67 y.o.   MRN: [id number]   HPI Patient presents stating she is doing very well with the left foot but does have some redness that she wanted checked   ROS      Objective:  Physical Exam  Neurovascular status intact negative Homans sign was noted with slight localized breakdown of tissue around the left first metatarsal distal with no drainage noted or proximal edema erythema or drainage noted.  Overall structure looks good reduction of edema with the Unna boot we used last week     Assessment:  Overall doing well with slight redness on the left first metatarsal     Plan:  As precautionary measure I did go ahead and I started on doxycycline for the next 10 days.  I advised on continued compression elevation immobilization and reappoint 2 weeks or earlier if any issues should occur which I made her aware of today

## 2017-06-06 ENCOUNTER — Other Ambulatory Visit: Payer: Self-pay | Admitting: Family Medicine

## 2017-06-12 ENCOUNTER — Ambulatory Visit (INDEPENDENT_AMBULATORY_CARE_PROVIDER_SITE_OTHER): Payer: Medicare Other | Admitting: Pulmonary Disease

## 2017-06-12 ENCOUNTER — Ambulatory Visit: Payer: Medicare Other | Admitting: Pulmonary Disease

## 2017-06-12 ENCOUNTER — Encounter: Payer: Self-pay | Admitting: Pulmonary Disease

## 2017-06-12 VITALS — BP 106/64 | HR 86 | Ht 67.0 in | Wt 163.0 lb

## 2017-06-12 DIAGNOSIS — J449 Chronic obstructive pulmonary disease, unspecified: Secondary | ICD-10-CM | POA: Diagnosis not present

## 2017-06-12 LAB — PULMONARY FUNCTION TEST
DL/VA % PRED: 49 %
DL/VA: 2.54 ml/min/mmHg/L
DLCO unc % pred: 43 %
DLCO unc: 12.24 ml/min/mmHg
FEF 25-75 POST: 0.82 L/s
FEF 25-75 Pre: 0.72 L/sec
FEF2575-%CHANGE-POST: 14 %
FEF2575-%Pred-Post: 35 %
FEF2575-%Pred-Pre: 30 %
FEV1-%CHANGE-POST: 7 %
FEV1-%PRED-PRE: 57 %
FEV1-%Pred-Post: 62 %
FEV1-PRE: 1.56 L
FEV1-Post: 1.69 L
FEV1FVC-%Change-Post: 9 %
FEV1FVC-%PRED-PRE: 70 %
FEV6-%Change-Post: 0 %
FEV6-%PRED-PRE: 80 %
FEV6-%Pred-Post: 80 %
FEV6-POST: 2.76 L
FEV6-Pre: 2.76 L
FEV6FVC-%Change-Post: 1 %
FEV6FVC-%PRED-POST: 101 %
FEV6FVC-%Pred-Pre: 100 %
FVC-%CHANGE-POST: -1 %
FVC-%PRED-POST: 79 %
FVC-%PRED-PRE: 80 %
FVC-POST: 2.83 L
FVC-PRE: 2.87 L
POST FEV6/FVC RATIO: 98 %
PRE FEV6/FVC RATIO: 96 %
Post FEV1/FVC ratio: 60 %
Pre FEV1/FVC ratio: 54 %
RV % pred: 173 %
RV: 3.87 L
TLC % PRED: 122 %
TLC: 6.74 L
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
## 2017-06-12 NOTE — Progress Notes (Signed)
PFT done today. 

## 2017-06-12 NOTE — Patient Instructions (Signed)
I am glad you are doing well on the inhaler Your lung function tests show moderate COPD.  Continue current treatment Will make a follow-up in 1 year.  Please call sooner if there is any change in symptoms.

## 2017-06-13 ENCOUNTER — Encounter: Payer: Self-pay | Admitting: Pulmonary Disease

## 2017-06-14 ENCOUNTER — Other Ambulatory Visit: Payer: Medicare Other | Admitting: Podiatry

## 2017-06-19 ENCOUNTER — Other Ambulatory Visit: Payer: Self-pay | Admitting: Family Medicine

## 2017-06-19 DIAGNOSIS — M797 Fibromyalgia: Secondary | ICD-10-CM | POA: Diagnosis not present

## 2017-06-19 DIAGNOSIS — Z79891 Long term (current) use of opiate analgesic: Secondary | ICD-10-CM | POA: Diagnosis not present

## 2017-06-19 DIAGNOSIS — M150 Primary generalized (osteo)arthritis: Secondary | ICD-10-CM | POA: Diagnosis not present

## 2017-06-21 ENCOUNTER — Other Ambulatory Visit: Payer: Self-pay | Admitting: Obstetrics & Gynecology

## 2017-06-21 ENCOUNTER — Ambulatory Visit (INDEPENDENT_AMBULATORY_CARE_PROVIDER_SITE_OTHER): Payer: Medicare Other

## 2017-06-21 ENCOUNTER — Ambulatory Visit: Payer: Medicare Other | Admitting: Obstetrics & Gynecology

## 2017-06-21 DIAGNOSIS — N950 Postmenopausal bleeding: Secondary | ICD-10-CM

## 2017-06-21 NOTE — Progress Notes (Signed)
    BAILY HOVANEC 03/28/1951 [id number]        67 y.o.  G3P1   RP: F/U 2 month Pelvic US for PMB  HPI: No further PMB since last visit.  Menopause, well on no HRT.  No pelvic pain.   OB History  Gravida Para Term Preterm AB Living  3 1       1   SAB TAB Ectopic Multiple Live Births               # Outcome Date GA Lbr Len/2nd Weight Sex Delivery Anes PTL Lv  3 Gravida           2 Gravida           1 Para               Past medical history,surgical history, problem list, medications, allergies, family history and social history were all reviewed and documented in the EPIC chart.   Directed ROS with pertinent positives and negatives documented in the history of present illness/assessment and plan.  Exam:  There were no vitals filed for this visit. General appearance:  Normal  Pelvic US on 04/26/2017:  Endometrial lining 1.4 mm, but fluid-filled measuring 1.0 x 0.2 cm and 2 solid Ca++ at 3 mm and 3 x 4 mm. Left ovary normal. Right ovary with a thick-walled collapsed small cyst measuring 8 x 5 mm.  Pelvic US today: T/A and T/V images.  Anteverted uterus measuring 4.47 x 3.21 x 1.77 cm.  Still a fluid-filled endometrium (Post LEEP with cervical stenosis) with 1.1 x 0.4 x 0.5 cm of fluid.  Endometrial lining is very thin at 1.2 mm.  Calcifications in the wall of the endometrium are measured smaller at 2 mm and 2.5 mm.  Right ovary not seen.  Left ovary normal and atrophic.  No apparent mass in the right or left adnexa.  No free fluid in the posterior cul-de-sac.   Assessment/Plan:  67 y.o. G3P1   1. Postmenopausal bleeding No further postmenopausal bleeding since January 2019.  Endometrial lining very thin at 1.2 mm.  Calcifications in the wall of the endometrium measured smaller than last time.  History of LEEP with cervical stenosis showing similar endometrial fluid.  No adnexal mass.  No free fluid in the posterior cul-de-sac.  Patient reassured about the pelvic ultrasound  findings today.  No evidence of endometrial pathology.  Decision to observe.  Recommendations discussed to follow-up for reevaluation if recurrence of postmenopausal bleeding.  Counseling and coordination of care on above issues more than 50% for 15 minutes.  Princess Bruins MD, 2:55 PM 06/21/2017

## 2017-06-22 ENCOUNTER — Encounter: Payer: Self-pay | Admitting: Podiatry

## 2017-06-22 ENCOUNTER — Ambulatory Visit (INDEPENDENT_AMBULATORY_CARE_PROVIDER_SITE_OTHER): Payer: Medicare Other | Admitting: Podiatry

## 2017-06-22 ENCOUNTER — Ambulatory Visit (INDEPENDENT_AMBULATORY_CARE_PROVIDER_SITE_OTHER): Payer: Medicare Other

## 2017-06-22 VITALS — BP 96/52 | HR 74

## 2017-06-22 DIAGNOSIS — M21619 Bunion of unspecified foot: Secondary | ICD-10-CM

## 2017-06-22 NOTE — Progress Notes (Signed)
Subjective:   Patient ID: Erin Lopez, female   DOB: 67 y.o.   MRN: [id number]   HPI Patient states doing well with still swelling if she is on her foot too long but overall continues to improve and is satisfied with the structural position of the big toe joint and second toe   ROS      Objective:  Physical Exam  Neurovascular status intact with patient's left foot healing well with wound edges well coapted hallux in rectus position second toe in good alignment with significant reduction of deformity and significant reduction of pain     Assessment:  Doing well post osteotomy left and implant procedure left along with Akin osteotomy left     Plan:  Reviewed that she continues to get better with good motion of the first MPJ and advised this patient on range of motion exercises and gradual return to soft shoe gear.  Patient will be seen back for [place] to recheck  X-rays indicate the osteotomies are healing well with good alignment noted and the implant is in place second MPJ

## 2017-06-25 ENCOUNTER — Encounter: Payer: Self-pay | Admitting: Obstetrics & Gynecology

## 2017-06-25 NOTE — Patient Instructions (Signed)
1. Postmenopausal bleeding No further postmenopausal bleeding since January 2019.  Endometrial lining very thin at 1.2 mm.  Calcifications in the wall of the endometrium measured smaller than last time.  History of LEEP with cervical stenosis showing similar endometrial fluid.  No adnexal mass.  No free fluid in the posterior cul-de-sac.  Patient reassured about the pelvic ultrasound findings today.  No evidence of endometrial pathology.  Decision to observe.  Recommendations discussed to follow-up for reevaluation if recurrence of postmenopausal bleeding.  Barnetta Chapel, good seeing you today!

## 2017-07-18 ENCOUNTER — Ambulatory Visit (INDEPENDENT_AMBULATORY_CARE_PROVIDER_SITE_OTHER): Payer: Medicare Other | Admitting: Family Medicine

## 2017-07-18 ENCOUNTER — Ambulatory Visit (INDEPENDENT_AMBULATORY_CARE_PROVIDER_SITE_OTHER): Payer: Medicare Other

## 2017-07-18 ENCOUNTER — Encounter: Payer: Self-pay | Admitting: Internal Medicine

## 2017-07-18 ENCOUNTER — Encounter: Payer: Self-pay | Admitting: Family Medicine

## 2017-07-18 VITALS — BP 122/80 | HR 79 | Temp 97.7°F | Resp 16 | Ht 67.0 in | Wt 164.0 lb

## 2017-07-18 DIAGNOSIS — R7303 Prediabetes: Secondary | ICD-10-CM | POA: Diagnosis not present

## 2017-07-18 DIAGNOSIS — Z0001 Encounter for general adult medical examination with abnormal findings: Secondary | ICD-10-CM | POA: Diagnosis not present

## 2017-07-18 DIAGNOSIS — Z0000 Encounter for general adult medical examination without abnormal findings: Secondary | ICD-10-CM

## 2017-07-18 DIAGNOSIS — I10 Essential (primary) hypertension: Secondary | ICD-10-CM

## 2017-07-18 DIAGNOSIS — F3160 Bipolar disorder, current episode mixed, unspecified: Secondary | ICD-10-CM

## 2017-07-18 DIAGNOSIS — Z1231 Encounter for screening mammogram for malignant neoplasm of breast: Secondary | ICD-10-CM | POA: Diagnosis not present

## 2017-07-18 DIAGNOSIS — F411 Generalized anxiety disorder: Secondary | ICD-10-CM | POA: Diagnosis not present

## 2017-07-18 DIAGNOSIS — E7800 Pure hypercholesterolemia, unspecified: Secondary | ICD-10-CM

## 2017-07-18 DIAGNOSIS — M810 Age-related osteoporosis without current pathological fracture: Secondary | ICD-10-CM | POA: Diagnosis not present

## 2017-07-18 DIAGNOSIS — E2839 Other primary ovarian failure: Secondary | ICD-10-CM

## 2017-07-18 DIAGNOSIS — Z1239 Encounter for other screening for malignant neoplasm of breast: Secondary | ICD-10-CM

## 2017-07-18 LAB — CBC WITH DIFFERENTIAL/PLATELET
BASOS ABS: 0 10*3/uL (ref 0.0–0.1)
BASOS PCT: 0.6 % (ref 0.0–3.0)
EOS ABS: 0.1 10*3/uL (ref 0.0–0.7)
Eosinophils Relative: 1.8 % (ref 0.0–5.0)
HEMATOCRIT: 32.8 % — AB (ref 36.0–46.0)
HEMOGLOBIN: 11.4 g/dL — AB (ref 12.0–15.0)
LYMPHS PCT: 23.4 % (ref 12.0–46.0)
Lymphs Abs: 1.3 10*3/uL (ref 0.7–4.0)
MCHC: 34.9 g/dL (ref 30.0–36.0)
MCV: 87.9 fl (ref 78.0–100.0)
MONO ABS: 0.4 10*3/uL (ref 0.1–1.0)
Monocytes Relative: 7.4 % (ref 3.0–12.0)
Neutro Abs: 3.6 10*3/uL (ref 1.4–7.7)
Neutrophils Relative %: 66.8 % (ref 43.0–77.0)
Platelets: 255 10*3/uL (ref 150.0–400.0)
RBC: 3.73 Mil/uL — AB (ref 3.87–5.11)
RDW: 13.7 % (ref 11.5–15.5)
WBC: 5.3 10*3/uL (ref 4.0–10.5)

## 2017-07-18 LAB — LIPID PANEL
CHOLESTEROL: 168 mg/dL (ref 0–200)
HDL: 66.2 mg/dL (ref >39.00)
LDL CALC: 85 mg/dL (ref 0–99)
NonHDL: 102.06
TRIGLYCERIDES: 86 mg/dL (ref 0.0–149.0)
Total CHOL/HDL Ratio: 3
VLDL: 17.2 mg/dL (ref 0.0–40.0)

## 2017-07-18 LAB — COMPREHENSIVE METABOLIC PANEL
ALBUMIN: 4.6 g/dL (ref 3.5–5.2)
ALK PHOS: 55 U/L (ref 39–117)
ALT: 18 U/L (ref 0–35)
AST: 18 U/L (ref 0–37)
BUN: 10 mg/dL (ref 6–23)
CALCIUM: 9.8 mg/dL (ref 8.4–10.5)
CO2: 29 mEq/L (ref 19–32)
CREATININE: 0.79 mg/dL (ref 0.40–1.20)
Chloride: 102 mEq/L (ref 96–112)
GFR: 77.74 mL/min (ref >60.00)
Glucose, Bld: 97 mg/dL (ref 70–99)
Potassium: 3.6 mEq/L (ref 3.5–5.1)
SODIUM: 138 meq/L (ref 135–145)
TOTAL PROTEIN: 6.7 g/dL (ref 6.0–8.3)
Total Bilirubin: 1 mg/dL (ref 0.2–1.2)

## 2017-07-18 LAB — TSH: TSH: 0.89 u[IU]/mL (ref 0.35–4.50)

## 2017-07-18 LAB — HEMOGLOBIN A1C: Hgb A1c MFr Bld: 5.7 % (ref 4.6–6.5)

## 2017-07-18 MED ORDER — QUETIAPINE FUMARATE 50 MG PO TABS: 50.0000 mg | ORAL_TABLET | Freq: Two times a day (BID) | ORAL | 0 refills | Status: DC

## 2017-07-18 NOTE — Patient Instructions (Addendum)
Schedule mammogram and bone scan.   Schedule eye exam.  Bring a copy of your living will and/or healthcare power of attorney to your next office visit.  Start doing brain stimulating activities (puzzles, reading, adult coloring books, staying active) to keep memory sharp.   Continue to eat heart healthy diet (full of fruits, vegetables, whole grains, lean protein, water--limit salt, fat, and sugar intake) and increase physical activity as tolerated.  Health Maintenance, Female Adopting a healthy lifestyle and getting preventive care can go a long way to promote health and wellness. Talk with your health care provider about what schedule of regular examinations is right for you. This is a good chance for you to check in with your provider about disease prevention and staying healthy. In between checkups, there are plenty of things you can do on your own. Experts have done a lot of research about which lifestyle changes and preventive measures are most likely to keep you healthy. Ask your health care provider for more information. Weight and diet Eat a healthy diet  Be sure to include plenty of vegetables, fruits, low-fat dairy products, and lean protein.  Do not eat a lot of foods high in solid fats, added sugars, or salt.  Get regular exercise. This is one of the most important things you can do for your health. ? Most adults should exercise for at least 150 minutes each week. The exercise should increase your heart rate and make you sweat (moderate-intensity exercise). ? Most adults should also do strengthening exercises at least twice a week. This is in addition to the moderate-intensity exercise.  Maintain a healthy weight  Body mass index (BMI) is a measurement that can be used to identify possible weight problems. It estimates body fat based on height and weight. Your health care provider can help determine your BMI and help you achieve or maintain a healthy weight.  For females 65  years of age and older: ? A BMI below 18.5 is considered underweight. ? A BMI of 18.5 to 24.9 is normal. ? A BMI of 25 to 29.9 is considered overweight. ? A BMI of 30 and above is considered obese.  Watch levels of cholesterol and blood lipids  You should start having your blood tested for lipids and cholesterol at 65 years of age, then have this test every 5 years.  You may need to have your cholesterol levels checked more often if: ? Your lipid or cholesterol levels are high. ? You are older than 65 years of age. ? You are at high risk for heart disease.  Cancer screening Lung Cancer  Lung cancer screening is recommended for adults 68-49 years old who are at high risk for lung cancer because of a history of smoking.  A yearly low-dose CT scan of the lungs is recommended for people who: ? Currently smoke. ? Have quit within the past 15 years. ? Have at least a 30-pack-year history of smoking. A pack year is smoking an average of one pack of cigarettes a day for 1 year.  Yearly screening should continue until it has been 15 years since you quit.  Yearly screening should stop if you develop a health problem that would prevent you from having lung cancer treatment.  Breast Cancer  Practice breast self-awareness. This means understanding how your breasts normally appear and feel.  It also means doing regular breast self-exams. Let your health care provider know about any changes, no matter how small.  If you are in  your 20s or 30s, you should have a clinical breast exam (CBE) by a health care provider every 1-3 years as part of a regular health exam.  If you are 40 or older, have a CBE every year. Also consider having a breast X-ray (mammogram) every year.  If you have a family history of breast cancer, talk to your health care provider about genetic screening.  If you are at high risk for breast cancer, talk to your health care provider about having an MRI and a mammogram every  year.  Breast cancer gene (BRCA) assessment is recommended for women who have family members with BRCA-related cancers. BRCA-related cancers include: ? Breast. ? Ovarian. ? Tubal. ? Peritoneal cancers.  Results of the assessment will determine the need for genetic counseling and BRCA1 and BRCA2 testing.  Cervical Cancer Your health care provider may recommend that you be screened regularly for cancer of the pelvic organs (ovaries, uterus, and vagina). This screening involves a pelvic examination, including checking for microscopic changes to the surface of your cervix (Pap test). You may be encouraged to have this screening done every 3 years, beginning at age 21.  For women ages 30-65, health care providers may recommend pelvic exams and Pap testing every 3 years, or they may recommend the Pap and pelvic exam, combined with testing for human papilloma virus (HPV), every 5 years. Some types of HPV increase your risk of cervical cancer. Testing for HPV may also be done on women of any age with unclear Pap test results.  Other health care providers may not recommend any screening for nonpregnant women who are considered low risk for pelvic cancer and who do not have symptoms. Ask your health care provider if a screening pelvic exam is right for you.  If you have had past treatment for cervical cancer or a condition that could lead to cancer, you need Pap tests and screening for cancer for at least 20 years after your treatment. If Pap tests have been discontinued, your risk factors (such as having a new sexual partner) need to be reassessed to determine if screening should resume. Some women have medical problems that increase the chance of getting cervical cancer. In these cases, your health care provider may recommend more frequent screening and Pap tests.  Colorectal Cancer  This type of cancer can be detected and often prevented.  Routine colorectal cancer screening usually begins at 65  years of age and continues through 65 years of age.  Your health care provider may recommend screening at an earlier age if you have risk factors for colon cancer.  Your health care provider may also recommend using home test kits to check for hidden blood in the stool.  A small camera at the end of a tube can be used to examine your colon directly (sigmoidoscopy or colonoscopy). This is done to check for the earliest forms of colorectal cancer.  Routine screening usually begins at age 50.  Direct examination of the colon should be repeated every 5-10 years through 65 years of age. However, you may need to be screened more often if early forms of precancerous polyps or small growths are found.  Skin Cancer  Check your skin from head to toe regularly.  Tell your health care provider about any new moles or changes in moles, especially if there is a change in a mole's shape or color.  Also tell your health care provider if you have a mole that is larger than the size   of a pencil eraser.  Always use sunscreen. Apply sunscreen liberally and repeatedly throughout the day.  Protect yourself by wearing long sleeves, pants, a wide-brimmed hat, and sunglasses whenever you are outside.  Heart disease, diabetes, and high blood pressure  High blood pressure causes heart disease and increases the risk of stroke. High blood pressure is more likely to develop in: ? People who have blood pressure in the high end of the normal range (130-139/85-89 mm Hg). ? People who are overweight or obese. ? People who are African American.  If you are 64-79 years of age, have your blood pressure checked every 3-5 years. If you are 71 years of age or older, have your blood pressure checked every year. You should have your blood pressure measured twice-once when you are at a hospital or clinic, and once when you are not at a hospital or clinic. Record the average of the two measurements. To check your blood pressure  when you are not at a hospital or clinic, you can use: ? An automated blood pressure machine at a pharmacy. ? A home blood pressure monitor.  If you are between 90 years and 42 years old, ask your health care provider if you should take aspirin to prevent strokes.  Have regular diabetes screenings. This involves taking a blood sample to check your fasting blood sugar level. ? If you are at a normal weight and have a low risk for diabetes, have this test once every three years after 65 years of age. ? If you are overweight and have a high risk for diabetes, consider being tested at a younger age or more often. Preventing infection Hepatitis B  If you have a higher risk for hepatitis B, you should be screened for this virus. You are considered at high risk for hepatitis B if: ? You were born in a country where hepatitis B is common. Ask your health care provider which countries are considered high risk. ? Your parents were born in a high-risk country, and you have not been immunized against hepatitis B (hepatitis B vaccine). ? You have HIV or AIDS. ? You use needles to inject street drugs. ? You live with someone who has hepatitis B. ? You have had sex with someone who has hepatitis B. ? You get hemodialysis treatment. ? You take certain medicines for conditions, including cancer, organ transplantation, and autoimmune conditions.  Hepatitis C  Blood testing is recommended for: ? Everyone born from [birth year] through 1963. ? Anyone with known risk factors for hepatitis C.  Sexually transmitted infections (STIs)  You should be screened for sexually transmitted infections (STIs) including gonorrhea and chlamydia if: ? You are sexually active and are younger than 65 years of age. ? You are older than 65 years of age and your health care provider tells you that you are at risk for this type of infection. ? Your sexual activity has changed since you were last screened and you are at an increased  risk for chlamydia or gonorrhea. Ask your health care provider if you are at risk.  If you do not have HIV, but are at risk, it may be recommended that you take a prescription medicine daily to prevent HIV infection. This is called pre-exposure prophylaxis (PrEP). You are considered at risk if: ? You are sexually active and do not regularly use condoms or know the HIV status of your partner(s). ? You take drugs by injection. ? You are sexually active with a partner who  has HIV.  Talk with your health care provider about whether you are at high risk of being infected with HIV. If you choose to begin PrEP, you should first be tested for HIV. You should then be tested every 3 months for as long as you are taking PrEP. Pregnancy  If you are premenopausal and you may become pregnant, ask your health care provider about preconception counseling.  If you may become pregnant, take 400 to 800 micrograms (mcg) of folic acid every day.  If you want to prevent pregnancy, talk to your health care provider about birth control (contraception). Osteoporosis and menopause  Osteoporosis is a disease in which the bones lose minerals and strength with aging. This can result in serious bone fractures. Your risk for osteoporosis can be identified using a bone density scan.  If you are 65 years of age or older, or if you are at risk for osteoporosis and fractures, ask your health care provider if you should be screened.  Ask your health care provider whether you should take a calcium or vitamin D supplement to lower your risk for osteoporosis.  Menopause may have certain physical symptoms and risks.  Hormone replacement therapy may reduce some of these symptoms and risks. Talk to your health care provider about whether hormone replacement therapy is right for you. Follow these instructions at home:  Schedule regular health, dental, and eye exams.  Stay current with your immunizations.  Do not use any  tobacco products including cigarettes, chewing tobacco, or electronic cigarettes.  If you are pregnant, do not drink alcohol.  If you are breastfeeding, limit how much and how often you drink alcohol.  Limit alcohol intake to no more than 1 drink per day for nonpregnant women. One drink equals 12 ounces of beer, 5 ounces of wine, or 1 ounces of hard liquor.  Do not use street drugs.  Do not share needles.  Ask your health care provider for help if you need support or information about quitting drugs.  Tell your health care provider if you often feel depressed.  Tell your health care provider if you have ever been abused or do not feel safe at home. This information is not intended to replace advice given to you by your health care provider. Make sure you discuss any questions you have with your health care provider. Document Released: 10/04/2010 Document Revised: 08/27/2015 Document Reviewed: 12/23/2014 [employer] Interactive Patient Education  2018 [employer]  

## 2017-07-18 NOTE — Progress Notes (Signed)
AWV reviewed and agree. 
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
## 2017-07-20 DIAGNOSIS — M797 Fibromyalgia: Secondary | ICD-10-CM | POA: Diagnosis not present

## 2017-07-20 DIAGNOSIS — M150 Primary generalized (osteo)arthritis: Secondary | ICD-10-CM | POA: Diagnosis not present

## 2017-07-20 DIAGNOSIS — Z79891 Long term (current) use of opiate analgesic: Secondary | ICD-10-CM | POA: Diagnosis not present

## 2017-07-25 ENCOUNTER — Ambulatory Visit: Payer: Medicare Other | Admitting: Family Medicine

## 2017-07-25 ENCOUNTER — Encounter: Payer: Self-pay | Admitting: Family Medicine

## 2017-07-25 VITALS — BP 95/60 | HR 79 | Temp 98.3°F | Resp 16 | Ht 67.0 in | Wt 165.2 lb

## 2017-07-25 DIAGNOSIS — F3161 Bipolar disorder, current episode mixed, mild: Secondary | ICD-10-CM | POA: Diagnosis not present

## 2017-07-25 DIAGNOSIS — R1030 Lower abdominal pain, unspecified: Secondary | ICD-10-CM

## 2017-07-25 DIAGNOSIS — D649 Anemia, unspecified: Secondary | ICD-10-CM

## 2017-07-25 MED ORDER — ZOSTER VAC RECOMB ADJUVANTED 50 MCG/0.5ML IM SUSR: 0.5000 mL | Freq: Once | INTRAMUSCULAR | 1 refills | Status: AC

## 2017-07-25 NOTE — Patient Instructions (Signed)
Take your quetiapine 50mg  tab and split it in half and take this 1/2 tab every night. After 1 week, try taking 1 whole tab every night. You must take this on a daily basis in order to CONSISTENTLY help your bipolar disorder.

## 2017-07-25 NOTE — Progress Notes (Signed)
OFFICE VISIT  07/25/2017   CC:  Chief Complaint  Patient presents with  . Follow-up    psych   HPI:    Patient is a 67 y.o. Caucasian female who presents for 1 week f/u bipolar disorder, mixed episode currently, w/out psychotic features. Last week I added seroquel 50mg  bid to her lamictal 300 mg qd and her venlafaxine xr 150mg  qd. I also referred her to Dr. Gaynell Face for counseling. All labs at last week's CPE were excellent. Oversedated on seroquel 50mg  bid---took it 2 days then stopped it.    She says she feels a lot better regarding mood, cycling, irritability, etc.  Still c/o diffuse lower abd pain that is constant, sometimes gets severe.  No n/v. BMs alternate between loose and normal consistency to hard, to sometimes "loose and stringy", change colors from light brown to dark brown.  Says stool color/consistency does not vary with diet.  Some bloating in this region. No BRBPR.  No melena. No upper abd pains.   She doesn't identify any triggers for worsening. Onset not coincidental with any new med.  No med has been rx'd for this problem.  ROS: night sweats but no temp checked.    Past Medical History:  Diagnosis Date  . Alcoholism in remission [facility])    states last alcohol 11 yrs ago  . Anxiety   . Asymptomatic gallstones   . Bipolar 1 disorder [facility])    sees a psychiatrist  . Cervical spondylosis without myelopathy 12/19/2013   MRI 02/2014: multilevel DDD/spondylosis, not much change compared to prior MRI.  Pt not in favor of invasive therapy for her neck as of 04/2014.  Marland Kitchen Chronic renal insufficiency, stage II (mild)   . COPD (chronic obstructive pulmonary disease) (HCC)    Moderate: anoro started 04/2017 by pulm, plan for f/u PFTs.  . Endometritis 05/2017   possible; empiric tx with Flagyl by GYN.  . Environmental allergies   . Fibromyalgia   . GERD (gastroesophageal reflux disease)   . Hiatal hernia   . History of adenomatous polyp of colon 04/2008; 08/2014   No  high grade dysplasia: recall 5 yrs ([name], [name])  . History of basal cell carcinoma excision    NOSE  . History of Helicobacter pylori infection 04/2008   +gastric biopsy (gastritis but no metaplasia, dysplasia, or malignancy identified)  . History of TIA (transient ischemic attack)    secondary to HELLP syndrome 1994 post c/s--  residual memory loss  . Hyperlipidemia    no meds in > a decade per pt.  Recommended statin 06/21/16.  Marland Kitchen Hypertension   . IFG (impaired fasting glucose) 06/2016   Fasting gluc 105; HbA1c at that time was 6.0%.  . Internal hemorrhoids   . 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 04/2017.  . Memory loss    Abnl MRI brain and CT brain c/w chronic microvascular ischemia.  Repeat MRI brain 02/2014 stable (Dr. Mayra Reel with no plan to f/u with neuro as of 04/2014)  . Osteoporosis 05/2010   2012 'penia; 06/2015 'porosis:  prolia.  repeat DEXA 2 yrs.  As of 12/2016 pt going to discuss w/GYN whether or not to continue prolia.  . Pelvic floor dysfunction    [facility] ([name])  . Postmenopausal vaginal bleeding 05/2017   x 1 small episode; GYN attempted endo bx  but unable to penetrate cervix due to severe cerv stenosis (due to postmenopausal state + hx of LEEP).  Endo u/s showed thin endo lining.  Per GYN---no evidence of endomet pathology on w/u---obs as of 07/2017.  Marland Kitchen Recurrent kidney stones 08/2013   Left ureteral calculus: Perc nephr--cystoscopy w/ureteroscopy + laser for stone removal.  Residual asymptomatic left renal nephrolithiasis <87mm present post-procedure.  Right sided hydronephrosis-persistent (on u/s)--urol ordered CT to further eval 08/2016: 1.6 cm nonobstructing stone lower pole left kidney, no hydro, plan for PCN extraction ([facility])  . Shortness of breath   .  Sprain of neck 12/19/2013  . TMJ (temporomandibular joint disorder)    USES  MOUTH GUARD  . Vitamin D deficiency 05/2011  . Weak urinary stream 07/2016   with elevated PVR and mild left hydronephrosis secondary to this ([facility] started flomax 0.4mg  qd for this).    Past Surgical History:  Procedure Laterality Date  . ANTERIOR CERVICAL DECOMP/DISCECTOMY FUSION  06-30-2009   C3 -- C5  AND EXPLORATION OF FUSION C5-7 W/  PLATE REMOVAL  . CERVICAL FUSION  2002   anterior C5 -- C7  . CERVICAL SPINE SURGERY  08-27-1999     C6 -- T1  LAMINECTOMY/  DISKECTOMY  . CESAREAN SECTION  1994  . COLONOSCOPY W/ POLYPECTOMY  04/2008;08/2014   2016 tubular adenoma x 1; +diverticulosis and int/ext hemorrhoids.  Recall 5 yrs  . CYSTOSCOPY WITH URETEROSCOPY AND STENT PLACEMENT Left 08/28/2013   Procedure: CYSTOSCOPY, RGP,  WITH URETEROSCOPY AND STENT REMOVAL;  Surgeon: [name], MD;  Location[facility];  Service: Urology;  Laterality: Left;  . ESOPHAGOGASTRODUODENOSCOPY  2010; 08/2014   +Candidal esophagitis; Mild chronic gastritis w/intestinal metaplasia--NEG H pylori, neg for eosinophilic esoph  . HOLMIUM LASER APPLICATION Left 9/73/5329   Procedure: HOLMIUM LASER APPLICATION;  Surgeon: [name], MD;  Location: [facility];  Service: Urology;  Laterality: Left;  . NEGATIVE SLEEP STUDY  04-01-2007  . NEPHROLITHOTOMY Left 08/05/2013   Procedure: NEPHROLITHOTOMY PERCUTANEOUS;  Surgeon: [name], MD;  Location: [facility];  Service: Urology;  Laterality: Left;  . TONSILLECTOMY AND ADENOIDECTOMY  1975  . TOTAL KNEE ARTHROPLASTY Right 2003  . ULTRASOUND EXAM,PELVIC COMPLETE ([facility] HX)  06/25/15   NORMAL (done by GYN)    Outpatient Medications Prior to Visit  Medication Sig Dispense Refill  . albuterol (PROAIR HFA) 108 (90 Base) MCG/ACT inhaler INHALE 2 PUFFS INTO THE LUNGS EVERY 4 (FOUR) HOURS AS NEEDED FOR WHEEZING OR SHORTNESS OF BREATH. 8.5 each 1  .  ALPRAZolam (XANAX) 1 MG tablet Take 1 mg by mouth 3 (three) times daily as needed.     Marland Kitchen atorvastatin (LIPITOR) 20 MG tablet TAKE 1 TABLET (20 MG TOTAL) BY MOUTH DAILY. 30 tablet 5  . B Complex-C (B-COMPLEX WITH VITAMIN C) tablet Take 1 tablet by mouth daily.    . carvedilol (COREG) 25 MG tablet 1/2 tab po bid 90 tablet 1  . cetirizine (ZYRTEC) 10 MG tablet Take 10 mg by mouth every morning.     . chlorthalidone (HYGROTON) 25 MG tablet TAKE 1 TABLET (25 MG TOTAL) BY MOUTH DAILY. 30 tablet 5  . cyclobenzaprine (FLEXERIL) 10 MG tablet Take 10 mg by mouth 2 (two) times daily as needed.     . doxycycline (VIBRAMYCIN) 50 MG capsule TAKE ONE CAPSULE BY MOUTH UP TO 2 TIMES A DAY  3  . fluticasone (FLONASE) 50 MCG/ACT nasal spray Place 2 sprays into both  nostrils daily. 16 g 11  . lamoTRIgine (LAMICTAL) 100 MG tablet Take 3 tablets (300 mg total) by mouth at bedtime. 90 tablet 11  . lisinopril (PRINIVIL,ZESTRIL) 40 MG tablet TAKE 1 TABLET (40 MG TOTAL) BY MOUTH DAILY. 30 tablet 5  . montelukast (SINGULAIR) 10 MG tablet TAKE 1 TABLET (10 MG TOTAL) BY MOUTH EVERY EVENING. 30 tablet 12  . Multiple Vitamin (MULTIVITAMIN) tablet Take 1 tablet by mouth daily.    . Oxycodone HCl 10 MG TABS Take 1 tablet by mouth 3 (three) times daily.    . polyethylene glycol powder (GLYCOLAX/MIRALAX) powder TAKE 17 G BY MOUTH DAILY. 3162 g 0  . QUEtiapine (SEROQUEL) 50 MG tablet Take 1 tablet (50 mg total) by mouth 2 (two) times daily. 60 tablet 0  . ranitidine (ZANTAC) 150 MG capsule Take 150 mg by mouth 2 (two) times daily as needed.     . tamsulosin (FLOMAX) 0.4 MG CAPS capsule Take 0.4 mg by mouth daily.  11  . umeclidinium-vilanterol (ANORO ELLIPTA) 62.5-25 MCG/INH AEPB Inhale 1 puff into the lungs daily. 1 each 3  . venlafaxine XR (EFFEXOR-XR) 150 MG 24 hr capsule TAKE ONE CAPSULE BY MOUTH EVERY DAY WITH BREAKFAST 30 capsule 5  . zolpidem (AMBIEN) 10 MG tablet Take 10 mg by mouth at bedtime.     No  facility-administered medications prior to visit.     Allergies  Allergen Reactions  . Ceftin [Cefuroxime Axetil] Anaphylaxis    Swelling of eyes and tongue  . Ciprofloxacin Anaphylaxis    Difficulty breathing and generalized swelling  . Fosamax [Alendronate Sodium] Diarrhea  . Morphine And Related Other (See Comments)    Hallucinations/ disoriented - pt stated, "Only Morphine" - 03/23/17  . Prednisone Other (See Comments)    Hyperactivity    ROS As per HPI  PE: Blood pressure 95/60, pulse 79, temperature 98.3 F (36.8 C), temperature source Oral, resp. rate 16, height 5\' 7"  (1.702 m), weight 165 lb 4 oz (75 kg), SpO2 95 %. Gen: Alert, well appearing.  Patient is oriented to person, place, time, and situation. AFFECT: pleasant, lucid thought and speech. RDE:YCXK: no injection, icteris, swelling, or exudate.  EOMI, PERRLA. Mouth: lips without lesion/swelling.  Oral mucosa pink and moist. Oropharynx without erythema, exudate, or swelling.  CV: RRR, no m/r/g.   LUNGS: CTA bilat, nonlabored resps, good aeration in all lung fields. ABD: soft, Non-distended, diffuse lower abd TTP, no mass/HSM/bruit.  BS normal. EXT: no clubbing, cyanosis, or edema.    LABS:  Lab Results  Component Value Date   TSH 0.89 07/18/2017   Lab Results  Component Value Date   WBC 5.3 07/18/2017   HGB 11.4 (L) 07/18/2017   HCT 32.8 (L) 07/18/2017   MCV 87.9 07/18/2017   PLT 255.0 07/18/2017   Lab Results  Component Value Date   IRON 84 04/29/2014   FERRITIN 27.6 04/29/2014    Lab Results  Component Value Date   CREATININE 0.79 07/18/2017   BUN 10 07/18/2017   NA 138 07/18/2017   K 3.6 07/18/2017   CL 102 07/18/2017   CO2 29 07/18/2017   Lab Results  Component Value Date   ALT 18 07/18/2017   AST 18 07/18/2017   ALKPHOS 55 07/18/2017   BILITOT 1.0 07/18/2017   Lab Results  Component Value Date   CHOL 168 07/18/2017   Lab Results  Component Value Date   HDL 66.20 07/18/2017    Lab Results  Component Value Date  Spencer 85 07/18/2017   Lab Results  Component Value Date   TRIG 86.0 07/18/2017   Lab Results  Component Value Date   CHOLHDL 3 07/18/2017   Lab Results  Component Value Date   HGBA1C 5.7 07/18/2017   Lab Results  Component Value Date   LIPASE 29 08/10/2013    IMPRESSION AND PLAN:  1) Bipolar affective disorder: she is in a fairly stable state at this time--doutful that the couple of doses of seroquel she took after last visit has anything to do with this.  I really need to her to be on at least a low dose of an atypical antipsychotic, so I emphasized the need to try to continue seroquel 25mg  qhs. Instructions: Take your quetiapine 50mg  tab and split it in half and take this 1/2 tab every night. After 1 week, try taking 1 whole tab every night. You must take this on a daily basis in order to CONSISTENTLY help your bipolar disorder. Continue to try to arrange counseling with Dr. Gaynell Face.  2) Chronic lower abd pains, mild normocytic anemia. Hemoccults x 3 pending. She needs to get referred to GI after hemoccult results are in. She will try to get me the GI letter she said she was sent lately.  Spent 25 min with pt today, with >50% of this time spent in counseling and care coordination regarding the above problems.  An After Visit Summary was printed and given to the patient.  FOLLOW UP: No follow-ups on file.  Signed:  Crissie Sickles, MD           07/25/2017

## 2017-07-28 ENCOUNTER — Other Ambulatory Visit: Payer: Self-pay | Admitting: Family Medicine

## 2017-07-30 ENCOUNTER — Other Ambulatory Visit: Payer: Self-pay | Admitting: Family Medicine

## 2017-08-02 ENCOUNTER — Ambulatory Visit (INDEPENDENT_AMBULATORY_CARE_PROVIDER_SITE_OTHER): Payer: Medicare Other

## 2017-08-02 ENCOUNTER — Encounter: Payer: Self-pay | Admitting: Podiatry

## 2017-08-02 ENCOUNTER — Other Ambulatory Visit: Payer: Self-pay | Admitting: Family Medicine

## 2017-08-02 ENCOUNTER — Encounter: Payer: Self-pay | Admitting: *Deleted

## 2017-08-02 ENCOUNTER — Ambulatory Visit (INDEPENDENT_AMBULATORY_CARE_PROVIDER_SITE_OTHER): Payer: Medicare Other | Admitting: Podiatry

## 2017-08-02 DIAGNOSIS — M21619 Bunion of unspecified foot: Secondary | ICD-10-CM | POA: Diagnosis not present

## 2017-08-02 DIAGNOSIS — M2012 Hallux valgus (acquired), left foot: Secondary | ICD-10-CM

## 2017-08-02 DIAGNOSIS — M2042 Other hammer toe(s) (acquired), left foot: Secondary | ICD-10-CM | POA: Diagnosis not present

## 2017-08-02 NOTE — Progress Notes (Signed)
Subjective:   Patient ID: Erin Lopez, female   DOB: 67 y.o.   MRN: [id number]   HPI Patient presents with left foot doing very well with minimal discomfort and able to walk distances now without pain   ROS      Objective:  Physical Exam  Neurovascular status intact with patient's left foot showing structural correction with wound edges well healed in good alignment good range of motion first and second MPJ     Assessment:  Doing well post forefoot surgery left     Plan:  Final x-rays reviewed and at this point I recommended continuation of supportive shoe therapy and patient will be seen back as needed  X-rays indicate the osteotomy is healing well with good alignment and the implant is in place second MPJ

## 2017-08-03 ENCOUNTER — Encounter: Payer: Self-pay | Admitting: Family Medicine

## 2017-08-07 ENCOUNTER — Ambulatory Visit: Payer: Medicare Other | Admitting: Physician Assistant

## 2017-08-07 ENCOUNTER — Other Ambulatory Visit: Payer: Self-pay

## 2017-08-07 ENCOUNTER — Encounter: Payer: Self-pay | Admitting: Physician Assistant

## 2017-08-07 VITALS — BP 100/54 | HR 61 | Temp 98.2°F | Resp 14 | Ht 67.0 in | Wt 166.0 lb

## 2017-08-07 DIAGNOSIS — B029 Zoster without complications: Secondary | ICD-10-CM | POA: Diagnosis not present

## 2017-08-07 HISTORY — DX: Zoster without complications: B02.9

## 2017-08-07 MED ORDER — VALACYCLOVIR HCL 1 G PO TABS: 1000.0000 mg | ORAL_TABLET | Freq: Three times a day (TID) | ORAL | 0 refills | Status: DC

## 2017-08-07 NOTE — Patient Instructions (Signed)
Please take the Valtrex as directed. Avoid tight-fitting clothing. Keep the rash covered as if there are uncovered lesions, you can transmit chicken pox to someone who has never had (dangerous to pregnant women).  Can get OTC lidocaine cream to apply to the area.  Tylenol for pain.   For the spot on the leg -- this could be the start of a benign keratosis but the border seems red, raised and scaly.  Giving potential ringworm, get some OTC clotrimazole BID for 2 weeks to see if this clears.    Shingles Shingles is an infection that causes a painful skin rash and fluid-filled blisters. Shingles is caused by the same virus that causes chickenpox. Shingles only develops in people who:  Have had chickenpox.  Have gotten the chickenpox vaccine. (This is rare.)  The first symptoms of shingles may be itching, tingling, or pain in an area on your skin. A rash will follow in a few days or weeks. The rash is usually on one side of the body in a bandlike or beltlike pattern. Over time, the rash turns into fluid-filled blisters that break open, scab over, and dry up. Medicines may:  Help you manage pain.  Help you recover more quickly.  Help to prevent long-term problems.  Follow these instructions at home: Medicines  Take medicines only as told by your doctor.  Apply an anti-itch or numbing cream to the affected area as told by your doctor. Blister and Rash Care  Take a cool bath or put cool compresses on the area of the rash or blisters as told by your doctor. This may help with pain and itching.  Keep your rash covered with a loose bandage (dressing). Wear loose-fitting clothing.  Keep your rash and blisters clean with mild soap and cool water or as told by your doctor.  Check your rash every day for signs of infection. These include redness, swelling, and pain that lasts or gets worse.  Do not pick your blisters.  Do not scratch your rash. General instructions  Rest as told by  your doctor.  Keep all follow-up visits as told by your doctor. This is important.  Until your blisters scab over, your infection can cause chickenpox in people who have never had it or been vaccinated against it. To prevent this from happening, avoid touching other people or being around other people, especially: ? Babies. ? Pregnant women. ? Children who have eczema. ? Elderly people who have transplants. ? People who have chronic illnesses, such as leukemia or AIDS. Contact a doctor if:  Your pain does not get better with medicine.  Your pain does not get better after the rash heals.  Your rash looks infected. Signs of infection include: ? Redness. ? Swelling. ? Pain that lasts or gets worse. Get help right away if:  The rash is on your face or nose.  You have pain in your face, pain around your eye area, or loss of feeling on one side of your face.  You have ear pain or you have ringing in your ear.  You have loss of taste.  Your condition gets worse. This information is not intended to replace advice given to you by your health care provider. Make sure you discuss any questions you have with your health care provider. Document Released: 09/07/2007 Document Revised: 11/15/2015 Document Reviewed: 12/31/2013 Elsevier Interactive Patient Education  [employer].
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
## 2017-08-09 ENCOUNTER — Encounter: Payer: Self-pay | Admitting: Family Medicine

## 2017-08-14 ENCOUNTER — Other Ambulatory Visit: Payer: Self-pay | Admitting: Family Medicine

## 2017-08-14 NOTE — Telephone Encounter (Signed)
[facility] [address]  RF request for quetiaprine LOV: 07/25/17 Next ov: 09/22/17  Last written: 07/18/17 360 w/ 0RF  Please advise. Thanks.

## 2017-08-17 DIAGNOSIS — Z79891 Long term (current) use of opiate analgesic: Secondary | ICD-10-CM | POA: Diagnosis not present

## 2017-08-17 DIAGNOSIS — M797 Fibromyalgia: Secondary | ICD-10-CM | POA: Diagnosis not present

## 2017-08-17 DIAGNOSIS — M150 Primary generalized (osteo)arthritis: Secondary | ICD-10-CM | POA: Diagnosis not present

## 2017-09-02 HISTORY — PX: OTHER SURGICAL HISTORY: SHX169

## 2017-09-13 ENCOUNTER — Ambulatory Visit
Admission: RE | Admit: 2017-09-13 | Discharge: 2017-09-13 | Disposition: A | Discharge status: Home / Self Care | Payer: Medicare Other | Source: Ambulatory Visit | Attending: Family Medicine | Admitting: Family Medicine

## 2017-09-13 DIAGNOSIS — M8589 Other specified disorders of bone density and structure, multiple sites: Secondary | ICD-10-CM | POA: Diagnosis not present

## 2017-09-13 DIAGNOSIS — Z780 Asymptomatic menopausal state: Secondary | ICD-10-CM | POA: Diagnosis not present

## 2017-09-13 DIAGNOSIS — E2839 Other primary ovarian failure: Secondary | ICD-10-CM

## 2017-09-13 DIAGNOSIS — Z1231 Encounter for screening mammogram for malignant neoplasm of breast: Secondary | ICD-10-CM | POA: Diagnosis not present

## 2017-09-13 DIAGNOSIS — M810 Age-related osteoporosis without current pathological fracture: Secondary | ICD-10-CM

## 2017-09-13 DIAGNOSIS — Z1239 Encounter for other screening for malignant neoplasm of breast: Secondary | ICD-10-CM

## 2017-09-14 ENCOUNTER — Encounter: Payer: Self-pay | Admitting: Family Medicine

## 2017-09-14 DIAGNOSIS — Z79891 Long term (current) use of opiate analgesic: Secondary | ICD-10-CM | POA: Diagnosis not present

## 2017-09-14 DIAGNOSIS — M150 Primary generalized (osteo)arthritis: Secondary | ICD-10-CM | POA: Diagnosis not present

## 2017-09-14 DIAGNOSIS — M797 Fibromyalgia: Secondary | ICD-10-CM | POA: Diagnosis not present

## 2017-09-20 DIAGNOSIS — N200 Calculus of kidney: Secondary | ICD-10-CM | POA: Diagnosis not present

## 2017-09-21 DIAGNOSIS — N200 Calculus of kidney: Secondary | ICD-10-CM | POA: Diagnosis not present

## 2017-09-22 ENCOUNTER — Ambulatory Visit: Payer: Medicare Other | Admitting: Family Medicine

## 2017-09-24 ENCOUNTER — Other Ambulatory Visit: Payer: Self-pay | Admitting: Family Medicine

## 2017-09-25 NOTE — Telephone Encounter (Signed)
RF request for lamotrigine LOV: 07/25/17 Next ov: None Last written: 08/31/16 #90 w/ 11RF  Please advise. Thanks.

## 2017-09-27 ENCOUNTER — Other Ambulatory Visit: Payer: Self-pay | Admitting: Family Medicine

## 2017-10-11 ENCOUNTER — Encounter: Payer: Self-pay | Admitting: Family Medicine

## 2017-10-11 ENCOUNTER — Other Ambulatory Visit: Payer: Self-pay | Admitting: Adult Health

## 2017-10-12 DIAGNOSIS — M797 Fibromyalgia: Secondary | ICD-10-CM | POA: Diagnosis not present

## 2017-10-12 DIAGNOSIS — Z79891 Long term (current) use of opiate analgesic: Secondary | ICD-10-CM | POA: Diagnosis not present

## 2017-10-12 DIAGNOSIS — M150 Primary generalized (osteo)arthritis: Secondary | ICD-10-CM | POA: Diagnosis not present

## 2017-10-20 ENCOUNTER — Telehealth: Payer: Self-pay | Admitting: *Deleted

## 2017-10-20 DIAGNOSIS — N200 Calculus of kidney: Secondary | ICD-10-CM | POA: Diagnosis not present

## 2017-10-20 NOTE — Telephone Encounter (Signed)
Patient called c/o green discharge, itching and vaginal discomfort, recommended OV with provider, transferred to appointment desk to schedule.

## 2017-10-22 ENCOUNTER — Other Ambulatory Visit: Payer: Self-pay | Admitting: Family Medicine

## 2017-10-23 ENCOUNTER — Ambulatory Visit: Payer: Medicare Other | Admitting: Women's Health

## 2017-10-23 ENCOUNTER — Encounter: Payer: Self-pay | Admitting: Women's Health

## 2017-10-23 VITALS — BP 122/80

## 2017-10-23 DIAGNOSIS — N898 Other specified noninflammatory disorders of vagina: Secondary | ICD-10-CM | POA: Diagnosis not present

## 2017-10-23 LAB — WET PREP FOR TRICH, YEAST, CLUE

## 2017-10-23 MED ORDER — FLUCONAZOLE 150 MG PO TABS: 150.0000 mg | ORAL_TABLET | Freq: Once | ORAL | 0 refills | Status: AC

## 2017-10-23 NOTE — Progress Notes (Signed)
67 year old WWF G3, P1 presents with complaint of vaginal itching for the past week with some relief with over-the-counter Lotrimin.  On a low-dose daily antibiotic for skin issues.  States did see her urologist last week and was told that her urine was more alkaline than it should be.  Problems with chronic constipation on MiraLAX, mild constipation today.  Numerous health problems including bipolar, hypertension, hypercholesteremia, COPD, on disability.  04/2017 had postmenopausal bleeding and has had none since.  Ultrasound at that time did show endometrium of 1.4 mm.  On no HRT, not sexually active in years.  Exam: Appears well.  No CVAT.  Abdomen soft without rebound or radiation, external genitalia atrophic without visible erythema or discharge, speculum exam vaginal walls atrophic, scant yellow discharge without erythema, wet prep negative.  Bimanual no CMT or right-sided adnexal tenderness slight tenderness on left side.  Vaginal itching/vaginal atrophy  Plan: Diflucan 1501 dose, encouraged over-the-counter A&D ointment externally, encouraged to call if no relief of symptoms or continued problems.

## 2017-10-23 NOTE — Telephone Encounter (Signed)
Pls ask pt if she has been taking this med at all lately.-thx

## 2017-10-23 NOTE — Patient Instructions (Signed)

## 2017-10-24 NOTE — Telephone Encounter (Signed)
SW pt, she stated that she is no longer taking this medication and asked her pharmacy to remove it from her list. Rx declined.

## 2017-10-24 NOTE — Telephone Encounter (Signed)
Noted.  Will you pls remove it from her EMR med list--reason is "side effect" and in comments section put oversedation.-thx

## 2017-10-24 NOTE — Telephone Encounter (Signed)
Done

## 2017-10-25 ENCOUNTER — Other Ambulatory Visit: Payer: Self-pay | Admitting: Family Medicine

## 2017-11-03 DIAGNOSIS — H2513 Age-related nuclear cataract, bilateral: Secondary | ICD-10-CM | POA: Diagnosis not present

## 2017-11-14 ENCOUNTER — Ambulatory Visit: Payer: Medicare Other | Admitting: Family Medicine

## 2017-11-14 ENCOUNTER — Other Ambulatory Visit: Payer: Self-pay | Admitting: Family Medicine

## 2017-11-14 DIAGNOSIS — Z79891 Long term (current) use of opiate analgesic: Secondary | ICD-10-CM | POA: Diagnosis not present

## 2017-11-14 DIAGNOSIS — M797 Fibromyalgia: Secondary | ICD-10-CM | POA: Diagnosis not present

## 2017-11-14 DIAGNOSIS — M150 Primary generalized (osteo)arthritis: Secondary | ICD-10-CM | POA: Diagnosis not present

## 2017-11-15 DIAGNOSIS — S93401A Sprain of unspecified ligament of right ankle, initial encounter: Secondary | ICD-10-CM | POA: Diagnosis not present

## 2017-11-15 DIAGNOSIS — M542 Cervicalgia: Secondary | ICD-10-CM | POA: Diagnosis not present

## 2017-11-15 DIAGNOSIS — R0902 Hypoxemia: Secondary | ICD-10-CM | POA: Diagnosis not present

## 2017-11-15 DIAGNOSIS — R42 Dizziness and giddiness: Secondary | ICD-10-CM | POA: Diagnosis not present

## 2017-11-15 DIAGNOSIS — W19XXXA Unspecified fall, initial encounter: Secondary | ICD-10-CM | POA: Diagnosis not present

## 2017-11-15 DIAGNOSIS — S93602A Unspecified sprain of left foot, initial encounter: Secondary | ICD-10-CM | POA: Diagnosis not present

## 2017-11-15 DIAGNOSIS — T1490XA Injury, unspecified, initial encounter: Secondary | ICD-10-CM | POA: Diagnosis not present

## 2017-11-15 DIAGNOSIS — R52 Pain, unspecified: Secondary | ICD-10-CM | POA: Diagnosis not present

## 2017-11-15 DIAGNOSIS — S161XXA Strain of muscle, fascia and tendon at neck level, initial encounter: Secondary | ICD-10-CM | POA: Diagnosis not present

## 2017-11-15 DIAGNOSIS — S060X1A Concussion with loss of consciousness of 30 minutes or less, initial encounter: Secondary | ICD-10-CM | POA: Diagnosis not present

## 2017-11-16 ENCOUNTER — Encounter (HOSPITAL_COMMUNITY): Payer: Self-pay

## 2017-11-16 ENCOUNTER — Other Ambulatory Visit: Payer: Self-pay

## 2017-11-16 ENCOUNTER — Emergency Department (HOSPITAL_COMMUNITY): Payer: Medicare Other

## 2017-11-16 ENCOUNTER — Observation Stay (HOSPITAL_COMMUNITY)
Admission: EM | Admit: 2017-11-16 | Discharge: 2017-11-18 | Disposition: A | Discharge status: Home / Self Care | Payer: Medicare Other | Attending: Internal Medicine | Admitting: Internal Medicine

## 2017-11-16 DIAGNOSIS — E785 Hyperlipidemia, unspecified: Secondary | ICD-10-CM | POA: Insufficient documentation

## 2017-11-16 DIAGNOSIS — S93401A Sprain of unspecified ligament of right ankle, initial encounter: Secondary | ICD-10-CM | POA: Diagnosis not present

## 2017-11-16 DIAGNOSIS — Z87440 Personal history of urinary (tract) infections: Secondary | ICD-10-CM | POA: Diagnosis not present

## 2017-11-16 DIAGNOSIS — S060X9A Concussion with loss of consciousness of unspecified duration, initial encounter: Secondary | ICD-10-CM

## 2017-11-16 DIAGNOSIS — Z79899 Other long term (current) drug therapy: Secondary | ICD-10-CM | POA: Insufficient documentation

## 2017-11-16 DIAGNOSIS — W19XXXA Unspecified fall, initial encounter: Secondary | ICD-10-CM | POA: Insufficient documentation

## 2017-11-16 DIAGNOSIS — G894 Chronic pain syndrome: Secondary | ICD-10-CM | POA: Insufficient documentation

## 2017-11-16 DIAGNOSIS — Z87442 Personal history of urinary calculi: Secondary | ICD-10-CM | POA: Diagnosis not present

## 2017-11-16 DIAGNOSIS — I10 Essential (primary) hypertension: Secondary | ICD-10-CM | POA: Diagnosis present

## 2017-11-16 DIAGNOSIS — S060X1A Concussion with loss of consciousness of 30 minutes or less, initial encounter: Secondary | ICD-10-CM | POA: Diagnosis not present

## 2017-11-16 DIAGNOSIS — S99922A Unspecified injury of left foot, initial encounter: Secondary | ICD-10-CM | POA: Diagnosis not present

## 2017-11-16 DIAGNOSIS — I1310 Hypertensive heart and chronic kidney disease without heart failure, with stage 1 through stage 4 chronic kidney disease, or unspecified chronic kidney disease: Secondary | ICD-10-CM | POA: Insufficient documentation

## 2017-11-16 DIAGNOSIS — M25552 Pain in left hip: Secondary | ICD-10-CM | POA: Diagnosis not present

## 2017-11-16 DIAGNOSIS — J449 Chronic obstructive pulmonary disease, unspecified: Secondary | ICD-10-CM | POA: Diagnosis present

## 2017-11-16 DIAGNOSIS — K219 Gastro-esophageal reflux disease without esophagitis: Secondary | ICD-10-CM | POA: Insufficient documentation

## 2017-11-16 DIAGNOSIS — I69311 Memory deficit following cerebral infarction: Secondary | ICD-10-CM | POA: Insufficient documentation

## 2017-11-16 DIAGNOSIS — E871 Hypo-osmolality and hyponatremia: Principal | ICD-10-CM | POA: Insufficient documentation

## 2017-11-16 DIAGNOSIS — M25572 Pain in left ankle and joints of left foot: Secondary | ICD-10-CM | POA: Diagnosis not present

## 2017-11-16 DIAGNOSIS — N182 Chronic kidney disease, stage 2 (mild): Secondary | ICD-10-CM | POA: Diagnosis not present

## 2017-11-16 DIAGNOSIS — Z87891 Personal history of nicotine dependence: Secondary | ICD-10-CM | POA: Diagnosis not present

## 2017-11-16 DIAGNOSIS — Z79891 Long term (current) use of opiate analgesic: Secondary | ICD-10-CM | POA: Insufficient documentation

## 2017-11-16 DIAGNOSIS — S161XXA Strain of muscle, fascia and tendon at neck level, initial encounter: Secondary | ICD-10-CM

## 2017-11-16 DIAGNOSIS — S93402A Sprain of unspecified ligament of left ankle, initial encounter: Secondary | ICD-10-CM

## 2017-11-16 DIAGNOSIS — S93602A Unspecified sprain of left foot, initial encounter: Secondary | ICD-10-CM | POA: Diagnosis not present

## 2017-11-16 DIAGNOSIS — R51 Headache: Secondary | ICD-10-CM | POA: Diagnosis not present

## 2017-11-16 DIAGNOSIS — F319 Bipolar disorder, unspecified: Secondary | ICD-10-CM | POA: Insufficient documentation

## 2017-11-16 DIAGNOSIS — Z96651 Presence of right artificial knee joint: Secondary | ICD-10-CM | POA: Diagnosis not present

## 2017-11-16 DIAGNOSIS — R410 Disorientation, unspecified: Secondary | ICD-10-CM | POA: Diagnosis not present

## 2017-11-16 DIAGNOSIS — R9431 Abnormal electrocardiogram [ECG] [EKG]: Secondary | ICD-10-CM | POA: Diagnosis not present

## 2017-11-16 DIAGNOSIS — E876 Hypokalemia: Secondary | ICD-10-CM | POA: Insufficient documentation

## 2017-11-16 DIAGNOSIS — Z85828 Personal history of other malignant neoplasm of skin: Secondary | ICD-10-CM | POA: Diagnosis not present

## 2017-11-16 DIAGNOSIS — S299XXA Unspecified injury of thorax, initial encounter: Secondary | ICD-10-CM | POA: Diagnosis not present

## 2017-11-16 DIAGNOSIS — F419 Anxiety disorder, unspecified: Secondary | ICD-10-CM | POA: Diagnosis not present

## 2017-11-16 DIAGNOSIS — G9340 Encephalopathy, unspecified: Secondary | ICD-10-CM | POA: Insufficient documentation

## 2017-11-16 DIAGNOSIS — R079 Chest pain, unspecified: Secondary | ICD-10-CM | POA: Diagnosis not present

## 2017-11-16 DIAGNOSIS — Z7951 Long term (current) use of inhaled steroids: Secondary | ICD-10-CM | POA: Insufficient documentation

## 2017-11-16 DIAGNOSIS — M79672 Pain in left foot: Secondary | ICD-10-CM | POA: Diagnosis not present

## 2017-11-16 DIAGNOSIS — R413 Other amnesia: Secondary | ICD-10-CM | POA: Diagnosis present

## 2017-11-16 DIAGNOSIS — R55 Syncope and collapse: Secondary | ICD-10-CM | POA: Diagnosis present

## 2017-11-16 DIAGNOSIS — M25512 Pain in left shoulder: Secondary | ICD-10-CM | POA: Diagnosis not present

## 2017-11-16 LAB — RAPID URINE DRUG SCREEN, HOSP PERFORMED
Amphetamines: NOT DETECTED
BARBITURATES: NOT DETECTED
Benzodiazepines: POSITIVE — AB
Cocaine: NOT DETECTED
Opiates: NOT DETECTED
Tetrahydrocannabinol: NOT DETECTED

## 2017-11-16 LAB — CBC
HEMATOCRIT: 25.7 % — AB (ref 36.0–46.0)
HEMATOCRIT: 28.2 % — AB (ref 36.0–46.0)
HEMOGLOBIN: 9.2 g/dL — AB (ref 12.0–15.0)
HEMOGLOBIN: 9.9 g/dL — AB (ref 12.0–15.0)
MCH: 30.5 pg (ref 26.0–34.0)
MCH: 31 pg (ref 26.0–34.0)
MCHC: 35.1 g/dL (ref 30.0–36.0)
MCHC: 35.8 g/dL (ref 30.0–36.0)
MCV: 86.5 fL (ref 78.0–100.0)
MCV: 86.8 fL (ref 78.0–100.0)
Platelets: 175 10*3/uL (ref 150–400)
Platelets: 204 10*3/uL (ref 150–400)
RBC: 2.97 MIL/uL — ABNORMAL LOW (ref 3.87–5.11)
RBC: 3.25 MIL/uL — ABNORMAL LOW (ref 3.87–5.11)
RDW: 11.9 % (ref 11.5–15.5)
RDW: 12 % (ref 11.5–15.5)
WBC: 5 10*3/uL (ref 4.0–10.5)
WBC: 6.3 10*3/uL (ref 4.0–10.5)

## 2017-11-16 LAB — BASIC METABOLIC PANEL
ANION GAP: 7 (ref 5–15)
ANION GAP: 9 (ref 5–15)
Anion gap: 10 (ref 5–15)
BUN: 14 mg/dL (ref 8–23)
BUN: 13 mg/dL (ref 8–23)
BUN: 13 mg/dL (ref 8–23)
CHLORIDE: 81 mmol/L — AB (ref 98–111)
CHLORIDE: 88 mmol/L — AB (ref 98–111)
CHLORIDE: 89 mmol/L — AB (ref 98–111)
CO2: 29 mmol/L (ref 22–32)
CO2: 28 mmol/L (ref 22–32)
CO2: 28 mmol/L (ref 22–32)
CREATININE: 1 mg/dL (ref 0.44–1.00)
Calcium: 9.5 mg/dL (ref 8.9–10.3)
Calcium: 8.8 mg/dL — ABNORMAL LOW (ref 8.9–10.3)
Calcium: 9.3 mg/dL (ref 8.9–10.3)
Creatinine, Ser: 1.01 mg/dL — ABNORMAL HIGH (ref 0.44–1.00)
Creatinine, Ser: 1.04 mg/dL — ABNORMAL HIGH (ref 0.44–1.00)
GFR calc Af Amer: >60 mL/min (ref >60)
GFR calc non Af Amer: 58 mL/min — ABNORMAL LOW (ref >60)
GFR calc non Af Amer: 56 mL/min — ABNORMAL LOW (ref >60)
GFR, EST NON AFRICAN AMERICAN: 58 mL/min — AB (ref >60)
GLUCOSE: 103 mg/dL — AB (ref 70–99)
Glucose, Bld: 95 mg/dL (ref 70–99)
Glucose, Bld: 85 mg/dL (ref 70–99)
POTASSIUM: 3.6 mmol/L (ref 3.5–5.1)
Potassium: 3.6 mmol/L (ref 3.5–5.1)
Potassium: 3.6 mmol/L (ref 3.5–5.1)
Sodium: 120 mmol/L — ABNORMAL LOW (ref 135–145)
Sodium: 124 mmol/L — ABNORMAL LOW (ref 135–145)
Sodium: 125 mmol/L — ABNORMAL LOW (ref 135–145)

## 2017-11-16 LAB — OSMOLALITY: OSMOLALITY: 253 mosm/kg — AB (ref 275–295)

## 2017-11-16 LAB — URINALYSIS, ROUTINE W REFLEX MICROSCOPIC
BACTERIA UA: NONE SEEN
BILIRUBIN URINE: NEGATIVE
Glucose, UA: NEGATIVE mg/dL
Ketones, ur: NEGATIVE mg/dL
LEUKOCYTES UA: NEGATIVE
NITRITE: NEGATIVE
Protein, ur: NEGATIVE mg/dL
Specific Gravity, Urine: 1.002 — ABNORMAL LOW (ref 1.005–1.030)
pH: 8 (ref 5.0–8.0)

## 2017-11-16 LAB — TSH: TSH: 1.766 u[IU]/mL (ref 0.350–4.500)

## 2017-11-16 LAB — OSMOLALITY, URINE: Osmolality, Ur: 117 mOsm/kg — ABNORMAL LOW (ref 300–900)

## 2017-11-16 LAB — SODIUM, URINE, RANDOM

## 2017-11-16 LAB — URIC ACID: URIC ACID, SERUM: 3 mg/dL (ref 2.5–7.1)

## 2017-11-16 LAB — CORTISOL: CORTISOL PLASMA: 2.9 ug/dL

## 2017-11-16 LAB — TROPONIN I

## 2017-11-16 LAB — HIV ANTIBODY (ROUTINE TESTING W REFLEX): HIV SCREEN 4TH GENERATION: NONREACTIVE

## 2017-11-16 LAB — ETHANOL: Alcohol, Ethyl (B): <10 mg/dL (ref <10)

## 2017-11-16 LAB — CBG MONITORING, ED: GLUCOSE-CAPILLARY: 104 mg/dL — AB (ref 70–99)

## 2017-11-16 MED ORDER — VENLAFAXINE HCL ER 150 MG PO CP24
150.0000 mg | ORAL_CAPSULE | Freq: Every day | ORAL | Status: DC
  Administered 2017-11-16 – 2017-11-17 (×2): 150 mg via ORAL
  Filled 2017-11-16 (×2): qty 1

## 2017-11-16 MED ORDER — HYDRALAZINE HCL 20 MG/ML IJ SOLN: 10.0000 mg | INTRAMUSCULAR | Status: DC | PRN

## 2017-11-16 MED ORDER — CARVEDILOL 12.5 MG PO TABS
12.5000 mg | ORAL_TABLET | Freq: Two times a day (BID) | ORAL | Status: DC
  Administered 2017-11-16 – 2017-11-18 (×4): 12.5 mg via ORAL
  Filled 2017-11-16 (×5): qty 1

## 2017-11-16 MED ORDER — ACETAMINOPHEN 650 MG RE SUPP: 650.0000 mg | Freq: Four times a day (QID) | RECTAL | Status: DC | PRN

## 2017-11-16 MED ORDER — SODIUM CHLORIDE 0.9 % IV BOLUS (SEPSIS)
1000.0000 mL | Freq: Once | INTRAVENOUS | Status: AC
  Administered 2017-11-16: 1000 mL via INTRAVENOUS

## 2017-11-16 MED ORDER — OXYCODONE HCL 5 MG PO TABS
10.0000 mg | ORAL_TABLET | Freq: Once | ORAL | Status: AC
  Administered 2017-11-16: 10 mg via ORAL
  Filled 2017-11-16: qty 2

## 2017-11-16 MED ORDER — FENTANYL CITRATE (PF) 100 MCG/2ML IJ SOLN
50.0000 ug | Freq: Once | INTRAMUSCULAR | Status: AC
  Administered 2017-11-16: 50 ug via INTRAVENOUS
  Filled 2017-11-16: qty 2

## 2017-11-16 MED ORDER — ACETAMINOPHEN 325 MG PO TABS
650.0000 mg | ORAL_TABLET | Freq: Four times a day (QID) | ORAL | Status: DC | PRN
  Administered 2017-11-16 – 2017-11-17 (×3): 650 mg via ORAL
  Filled 2017-11-16 (×4): qty 2

## 2017-11-16 MED ORDER — ZOLPIDEM TARTRATE 5 MG PO TABS
5.0000 mg | ORAL_TABLET | Freq: Every day | ORAL | Status: DC
  Administered 2017-11-16 – 2017-11-17 (×2): 5 mg via ORAL
  Filled 2017-11-16 (×2): qty 1

## 2017-11-16 MED ORDER — CYCLOBENZAPRINE HCL 10 MG PO TABS: 10.0000 mg | ORAL_TABLET | Freq: Two times a day (BID) | ORAL | Status: DC | PRN

## 2017-11-16 MED ORDER — MONTELUKAST SODIUM 10 MG PO TABS
10.0000 mg | ORAL_TABLET | Freq: Every evening | ORAL | Status: DC
  Administered 2017-11-16 – 2017-11-17 (×2): 10 mg via ORAL
  Filled 2017-11-16 (×3): qty 1

## 2017-11-16 MED ORDER — ONDANSETRON HCL 4 MG/2ML IJ SOLN
4.0000 mg | Freq: Four times a day (QID) | INTRAMUSCULAR | Status: DC | PRN
  Administered 2017-11-16: 4 mg via INTRAVENOUS
  Filled 2017-11-16: qty 2

## 2017-11-16 MED ORDER — SODIUM CHLORIDE 1 G PO TABS
1.0000 g | ORAL_TABLET | Freq: Three times a day (TID) | ORAL | Status: DC
  Administered 2017-11-16 – 2017-11-17 (×4): 1 g via ORAL
  Filled 2017-11-16 (×5): qty 1

## 2017-11-16 MED ORDER — DOXYCYCLINE HYCLATE 50 MG PO CAPS
50.0000 mg | ORAL_CAPSULE | Freq: Two times a day (BID) | ORAL | Status: DC
  Administered 2017-11-16 – 2017-11-18 (×5): 50 mg via ORAL
  Filled 2017-11-16 (×5): qty 1

## 2017-11-16 MED ORDER — TAMSULOSIN HCL 0.4 MG PO CAPS
0.4000 mg | ORAL_CAPSULE | Freq: Every day | ORAL | Status: DC
  Administered 2017-11-16 – 2017-11-18 (×3): 0.4 mg via ORAL
  Filled 2017-11-16 (×3): qty 1

## 2017-11-16 MED ORDER — ALBUTEROL SULFATE (2.5 MG/3ML) 0.083% IN NEBU: 3.0000 mL | INHALATION_SOLUTION | RESPIRATORY_TRACT | Status: DC | PRN

## 2017-11-16 MED ORDER — OXYCODONE HCL 5 MG PO TABS
10.0000 mg | ORAL_TABLET | Freq: Four times a day (QID) | ORAL | Status: DC | PRN
  Administered 2017-11-16 – 2017-11-18 (×6): 10 mg via ORAL
  Filled 2017-11-16 (×6): qty 2

## 2017-11-16 MED ORDER — SODIUM CHLORIDE 0.9 % IV SOLN
INTRAVENOUS | Status: DC
  Administered 2017-11-16 – 2017-11-17 (×2): via INTRAVENOUS

## 2017-11-16 MED ORDER — ATORVASTATIN CALCIUM 20 MG PO TABS
20.0000 mg | ORAL_TABLET | Freq: Every day | ORAL | Status: DC
  Administered 2017-11-16 – 2017-11-18 (×3): 20 mg via ORAL
  Filled 2017-11-16 (×3): qty 1

## 2017-11-16 MED ORDER — POLYETHYLENE GLYCOL 3350 17 G PO PACK
17.0000 g | PACK | Freq: Every day | ORAL | Status: DC
  Administered 2017-11-16 – 2017-11-18 (×3): 17 g via ORAL
  Filled 2017-11-16 (×3): qty 1

## 2017-11-16 MED ORDER — ONDANSETRON HCL 4 MG PO TABS: 4.0000 mg | ORAL_TABLET | Freq: Four times a day (QID) | ORAL | Status: DC | PRN

## 2017-11-16 MED ORDER — FLUTICASONE PROPIONATE 50 MCG/ACT NA SUSP
2.0000 | Freq: Every day | NASAL | Status: DC
  Administered 2017-11-16 – 2017-11-18 (×3): 2 via NASAL
  Filled 2017-11-16: qty 16

## 2017-11-16 MED ORDER — ENOXAPARIN SODIUM 40 MG/0.4ML ~~LOC~~ SOLN
40.0000 mg | SUBCUTANEOUS | Status: DC
  Administered 2017-11-16 – 2017-11-18 (×3): 40 mg via SUBCUTANEOUS
  Filled 2017-11-16 (×3): qty 0.4

## 2017-11-16 MED ORDER — LISINOPRIL 20 MG PO TABS
20.0000 mg | ORAL_TABLET | Freq: Two times a day (BID) | ORAL | Status: DC
  Administered 2017-11-16 – 2017-11-18 (×5): 20 mg via ORAL
  Filled 2017-11-16 (×5): qty 1

## 2017-11-16 MED ORDER — LAMOTRIGINE 150 MG PO TABS
300.0000 mg | ORAL_TABLET | Freq: Every day | ORAL | Status: DC
  Administered 2017-11-16 – 2017-11-17 (×2): 300 mg via ORAL
  Filled 2017-11-16 (×2): qty 2

## 2017-11-16 MED ORDER — KETOROLAC TROMETHAMINE 30 MG/ML IJ SOLN
15.0000 mg | Freq: Once | INTRAMUSCULAR | Status: AC
  Administered 2017-11-16: 15 mg via INTRAVENOUS
  Filled 2017-11-16: qty 1

## 2017-11-16 MED ORDER — ALPRAZOLAM 0.25 MG PO TABS
1.0000 mg | ORAL_TABLET | Freq: Three times a day (TID) | ORAL | Status: DC | PRN
  Administered 2017-11-16 – 2017-11-17 (×2): 1 mg via ORAL
  Filled 2017-11-16 (×2): qty 4

## 2017-11-16 MED ORDER — POTASSIUM CITRATE ER 10 MEQ (1080 MG) PO TBCR
20.0000 meq | EXTENDED_RELEASE_TABLET | Freq: Two times a day (BID) | ORAL | Status: DC
  Administered 2017-11-16 – 2017-11-18 (×5): 20 meq via ORAL
  Filled 2017-11-16 (×5): qty 2

## 2017-11-16 MED ORDER — UMECLIDINIUM-VILANTEROL 62.5-25 MCG/INH IN AEPB
1.0000 | INHALATION_SPRAY | Freq: Every day | RESPIRATORY_TRACT | Status: DC
  Administered 2017-11-16 – 2017-11-18 (×3): 1 via RESPIRATORY_TRACT
  Filled 2017-11-16: qty 14

## 2017-11-16 NOTE — ED Provider Notes (Signed)
[place] EMERGENCY DEPARTMENT Provider Note   CSN: [id number] Arrival date & time: 11/16/17  0017     History   Chief Complaint Chief Complaint  Patient presents with  . Fall  . Loss of Consciousness    HPI Erin Lopez is a 67 y.o. female.  The history is provided by the patient.  Fall  This is a new problem. The current episode started 3 to 5 hours ago. The problem occurs constantly. The problem has been gradually improving. Associated symptoms include headaches and shortness of breath. Pertinent negatives include no chest pain and no abdominal pain. The symptoms are aggravated by walking. The symptoms are relieved by rest.  Loss of Consciousness   Associated symptoms include headaches. Pertinent negatives include abdominal pain, chest pain and fever.  Patient presents after fall Patient reports she may have lost her balance and fell and hit her head.  Patient is unsure of LOC. Apparently this occurred several hours ago, and then called EMS around 2300. The patient reports headache, neck pain, left foot and ankle pain, and also feeling short of breath.  She reports she is recently been instructed to drink a gallon of water a day Past Medical History:  Diagnosis Date  . Alcoholism in remission [facility])    states last alcohol 11 yrs ago  . Anxiety   . Asymptomatic gallstones   . Bipolar 1 disorder [facility])    sees a psychiatrist  . Cervical spondylosis without myelopathy 12/19/2013   MRI 02/2014: multilevel DDD/spondylosis, not much change compared to prior MRI.  Pt not in favor of invasive therapy for her neck as of 04/2014.  Marland Kitchen Chronic renal insufficiency, stage II (mild)   . COPD (chronic obstructive pulmonary disease) (HCC)    Moderate: anoro started 04/2017 by pulm, pt symptomatically improved and PFTs stable at f/u 06/2017--pulm f/u planned 1 yr.  . Endometritis 05/2017   possible; empiric tx with Flagyl by GYN.  . Environmental allergies   .  Fibromyalgia   . GERD (gastroesophageal reflux disease)   . Herpes zoster 08/07/2017   L scapular region  . Hiatal hernia   . History of adenomatous polyp of colon 04/2008; 08/2014   No high grade dysplasia: recall 5 yrs ([name], [name])  . History of basal cell carcinoma excision    NOSE  . History of Helicobacter pylori infection 04/2008   +gastric biopsy (gastritis but no metaplasia, dysplasia, or malignancy identified)  . History of TIA (transient ischemic attack)    secondary to HELLP syndrome 1994 post c/s--  residual memory loss  . Hyperlipidemia    no meds in > a decade per pt.  Recommended statin 06/21/16.  Marland Kitchen Hypertension   . IFG (impaired fasting glucose) 06/2016   Fasting gluc 105; HbA1c at that time was 6.0%.  . Internal hemorrhoids   . 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 04/2017.  . Memory loss    Abnl MRI brain and CT brain c/w chronic microvascular ischemia.  Repeat MRI brain 02/2014 stable (Dr. Mayra Reel with no plan to f/u with neuro as of 04/2014)  . Osteoporosis 05/2010   2012 'penia; 06/2015 'porosis:  prolia.  repeat DEXA 2 yrs.  As of 12/2016 pt going to discuss w/GYN whether or not to continue prolia.  DEXA 09/2017 T-score -2.2 femur neck.  Marland Kitchen  Pelvic floor dysfunction    [facility] ([name])  . Postmenopausal vaginal bleeding 05/2017   x 1 small episode; GYN attempted endo bx but unable to penetrate cervix due to severe cerv stenosis (due to postmenopausal state + hx of LEEP).  Endo u/s showed thin endo lining.  Per GYN---no evidence of endomet pathology on w/u---obs as of 07/2017.  Marland Kitchen Recurrent kidney stones 08/2013   Left ureteral calculus: Perc nephr--cystoscopy w/ureteroscopy + laser for stone removal.  Residual asymptomatic left renal nephrolithiasis <10mm present  post-procedure.  Right sided hydronephrosis-persistent (on u/s)--urol ordered CT to further eval 08/2016: 1.6 cm nonobstructing stone lower pole left kidney, no hydro, plan for PCN extraction ([facility])  . Shortness of breath   . Sprain of neck 12/19/2013  . TMJ (temporomandibular joint disorder)    USES  MOUTH GUARD  . Vitamin D deficiency 05/2011  . Weak urinary stream 07/2016   with elevated PVR and mild left hydronephrosis secondary to this ([facility] started flomax 0.4mg  qd for this).    Patient Active Problem List   Diagnosis Date Noted  . Bipolar 1 disorder ([place]) 12/29/2016  . Hypertension 12/29/2016  . TIA (transient ischemic attack) 12/29/2016  . Chronic pelvic pain in female 08/22/2016  . Stage 2 chronic kidney disease 08/22/2016  . Osteoporosis 09/30/2015  . Health maintenance examination 06/11/2015  . Suprapubic discomfort 06/05/2015  . Urethral caruncle 06/05/2015  . Infection of urinary tract 06/05/2015  . TMJ (temporomandibular joint disorder) 02/25/2015  . Right ankle injury 11/05/2014  . Cervical spondylosis without myelopathy 12/19/2013  . Sprain of neck 12/19/2013  . Memory difficulties 12/19/2013  . Postnasal drip 10/28/2013  . Renal calculus 08/05/2013  . Urinary frequency 08/01/2013  . Constipation, chronic 07/29/2013  . COPD (chronic obstructive pulmonary disease) ([place]) 06/30/2013  . Abnormal chest x-ray 06/30/2013  . Benign essential HTN 05/29/2013  . Kidney stone on left side 05/29/2013  . Hyperlipidemia 05/29/2013    Past Surgical History:  Procedure Laterality Date  . ANTERIOR CERVICAL DECOMP/DISCECTOMY FUSION  06-30-2009   C3 -- C5  AND EXPLORATION OF FUSION C5-7 W/  PLATE REMOVAL  . CERVICAL FUSION  2002   anterior C5 -- C7  . CERVICAL SPINE SURGERY  08-27-1999     C6 -- T1  LAMINECTOMY/  DISKECTOMY  . CESAREAN SECTION  1994  . COLONOSCOPY W/ POLYPECTOMY  04/2008;08/2014   2016 tubular adenoma x 1; +diverticulosis and int/ext hemorrhoids.   Recall 5 yrs  . CYSTOSCOPY WITH URETEROSCOPY AND STENT PLACEMENT Left 08/28/2013   Procedure: CYSTOSCOPY, RGP,  WITH URETEROSCOPY AND STENT REMOVAL;  Surgeon: [name], MD;  Location: [facility];  Service: Urology;  Laterality: Left;  . DEXA  09/2017   T-score -2.2 femur neck: improved compared to 06/2015.  Marland Kitchen ESOPHAGOGASTRODUODENOSCOPY  2010; 08/2014   +Candidal esophagitis; Mild chronic gastritis w/intestinal metaplasia--NEG H pylori, neg for eosinophilic esoph  . HOLMIUM LASER APPLICATION Left 1/75/1025   Procedure: HOLMIUM LASER APPLICATION;  Surgeon: [name], MD;  Location: [facility];  Service: Urology;  Laterality: Left;  . NEGATIVE SLEEP STUDY  04-01-2007  . NEPHROLITHOTOMY Left 08/05/2013   Procedure: NEPHROLITHOTOMY PERCUTANEOUS;  Surgeon: [name], MD;  Location: [facility];  Service: Urology;  Laterality: Left;  . TONSILLECTOMY AND ADENOIDECTOMY  1975  . TOTAL KNEE ARTHROPLASTY Right 2003  . ULTRASOUND EXAM,PELVIC COMPLETE ([facility] H[facility])  06/25/15   NORMAL (done by GYN)     OB History  Gravida  3   Para  1   Term      Preterm      AB      Living  1     SAB      TAB      Ectopic      Multiple      Live Births               Home Medications    Prior to Admission medications   Medication Sig Start Date End Date Taking? Authorizing Provider  albuterol (PROAIR HFA) 108 (90 Base) MCG/ACT inhaler INHALE 2 PUFFS INTO THE LUNGS EVERY 4 (FOUR) HOURS AS NEEDED FOR WHEEZING OR SHORTNESS OF BREATH. 09/17/15   McGowen, Adrian Blackwater, MD  ALPRAZolam Duanne Moron) 1 MG tablet Take 1 mg by mouth 3 (three) times daily as needed.     [provider]  ANORO ELLIPTA 62.5-25 MCG/INH AEPB TAKE 1 PUFF BY MOUTH EVERY DAY 10/12/17   Parrett, Tammy S, NP  atorvastatin (LIPITOR) 20 MG tablet TAKE 1 TABLET (20 MG TOTAL) BY MOUTH DAILY. 07/28/17   McGowen, Adrian Blackwater, MD  B Complex-C (B-COMPLEX WITH VITAMIN C) tablet Take 1 tablet by mouth daily.     [provider]  carvedilol (COREG) 25 MG tablet TAKE 1/2 TAB BY MOUTH TWICE A DAY 07/31/17   McGowen, Adrian Blackwater, MD  cetirizine (ZYRTEC) 10 MG tablet Take 10 mg by mouth every morning.     [provider]  chlorthalidone (HYGROTON) 25 MG tablet TAKE 1 TABLET (25 MG TOTAL) BY MOUTH DAILY. 09/27/17   McGowen, Adrian Blackwater, MD  cyclobenzaprine (FLEXERIL) 10 MG tablet Take 10 mg by mouth 2 (two) times daily as needed.  06/02/15   [provider]  doxycycline (VIBRAMYCIN) 50 MG capsule TAKE ONE CAPSULE BY MOUTH UP TO 2 TIMES A DAY 06/23/17   [provider]  fluticasone (FLONASE) 50 MCG/ACT nasal spray Place 2 sprays into both nostrils daily. 10/21/16   McGowen, Adrian Blackwater, MD  lamoTRIgine (LAMICTAL) 100 MG tablet TAKE 3 TABLETS (300 MG TOTAL) BY MOUTH AT BEDTIME. 09/25/17   McGowen, Adrian Blackwater, MD  lisinopril (PRINIVIL,ZESTRIL) 40 MG tablet TAKE 1 TABLET (40 MG TOTAL) BY MOUTH DAILY. 06/06/17 06/06/18  McGowen, Adrian Blackwater, MD  montelukast (SINGULAIR) 10 MG tablet TAKE 1 TABLET (10 MG TOTAL) BY MOUTH EVERY EVENING. 03/15/17   McGowen, Adrian Blackwater, MD  Multiple Vitamin (MULTIVITAMIN) tablet Take 1 tablet by mouth daily.    [provider]  Oxycodone HCl 10 MG TABS Take 1 tablet by mouth 3 (three) times daily. 06/10/15   [provider]  polyethylene glycol powder (GLYCOLAX/MIRALAX) powder TAKE 17 G BY MOUTH DAILY. 06/19/17   McGowen, Adrian Blackwater, MD  ranitidine (ZANTAC) 150 MG capsule Take 150 mg by mouth 2 (two) times daily as needed.     [provider]  tamsulosin (FLOMAX) 0.4 MG CAPS capsule Take 0.4 mg by mouth daily. 06/29/17   [provider]  valACYclovir (VALTREX) 1000 MG tablet Take 1 tablet (1,000 mg total) by mouth 3 (three) times daily. 08/07/17   Brunetta Jeans, PA-C  venlafaxine XR (EFFEXOR-XR) 150 MG 24 hr capsule TAKE ONE CAPSULE BY MOUTH EVERY DAY WITH BREAKFAST 10/25/17   McGowen, Adrian Blackwater, MD  zolpidem (AMBIEN) 10 MG tablet Take 10 mg by mouth  at bedtime.    [provider]    Family History Family History  Problem Relation Age of Onset  .  Alcohol abuse Mother   . Arthritis Mother   . Hypertension Mother   . Hyperlipidemia Mother   . Diabetes Mother   . Parkinsonism Mother   . Breast cancer Mother   . Prostate cancer Father   . Hypertension Father   . Hyperlipidemia Father   . Diabetes Father   . Heart disease Father   . Esophageal cancer Sister   . Colon polyps Sister   . Colon polyps Brother   . Heart disease Brother        x 2  . Irritable bowel syndrome Sister     Social History Social History   Tobacco Use  . Smoking status: Former Smoker    Packs/day: 1.00    Years: 33.00    Pack years: 33.00    Types: Cigarettes    Last attempt to quit: 08/22/1993    Years since quitting: 24.2  . Smokeless tobacco: Never Used  Substance Use Topics  . Alcohol use: No    Alcohol/week: 0.0 standard drinks    Comment: ALCOHOLIC IN REMISSION SINCE  2004  . Drug use: No     Allergies   Ceftin [cefuroxime axetil]; Ciprofloxacin; Fosamax [alendronate sodium]; Morphine and related; Prednisone; and Seroquel [quetiapine]   Review of Systems Review of Systems  Constitutional: Negative for fever.  Respiratory: Positive for shortness of breath.   Cardiovascular: Positive for syncope. Negative for chest pain.  Gastrointestinal: Negative for abdominal pain.  Musculoskeletal: Positive for arthralgias and neck pain.  Neurological: Positive for headaches.  All other systems reviewed and are negative.    Physical Exam Updated Vital Signs BP 107/65 (BP Location: Right Arm)   Pulse 65   Temp 97.8 F (36.6 C) (Oral)   Resp 11   Ht 1.676 m (5\' 6" )   Wt 72.6 kg   SpO2 96%   BMI 25.82 kg/m   Physical Exam CONSTITUTIONAL: Awake and alert, no acute distress HEAD: Abrasion and swelling to forehead, no other signs of trauma EYES: EOMI/PERRL ENMT: Mucous membranes moist, no facial trauma NECK: Collar in  place, no anterior neck bruising SPINE/BACK: Diffuse cervical spine tenderness, no other spinal tenderness, no bruising/crepitance/stepoffs noted to spine CV: S1/S2 noted, no murmurs/rubs/gallops noted LUNGS: Lungs are clear to auscultation bilaterally, no apparent distress ABDOMEN: soft, nontender, no rebound or guarding, bowel sounds noted throughout abdomen GU:no cva tenderness NEURO: Pt is awake/alert moves all extremitiesx4.  No facial droop.  GCS 15 EXTREMITIES: pulses normal/equal, full ROM, tenderness to palpation of the pelvis, tenderness palpation of left foot and left ankle, tenderness palpation of left arm, all other extremities/joints palpated/ranged and nontender SKIN: warm, color normal PSYCH: Odd affect   ED Treatments / Results  Labs (all labs ordered are listed, but only abnormal results are displayed) Labs Reviewed  BASIC METABOLIC PANEL - Abnormal; Notable for the following components:      Result Value   Sodium 120 (*)    Chloride 81 (*)    Glucose, Bld 103 (*)    Creatinine, Ser 1.01 (*)    GFR calc non Af Amer 58 (*)    All other components within normal limits  CBC - Abnormal; Notable for the following components:   RBC 3.25 (*)    Hemoglobin 9.9 (*)    HCT 28.2 (*)    All other components within normal limits  URINALYSIS, ROUTINE W REFLEX MICROSCOPIC - Abnormal; Notable for the following components:   Color, Urine COLORLESS (*)    Specific Gravity, Urine 1.002 (*)  Hgb urine dipstick SMALL (*)    All other components within normal limits  CBG MONITORING, ED - Abnormal; Notable for the following components:   Glucose-Capillary 104 (*)    All other components within normal limits  TROPONIN I  ETHANOL  RAPID URINE DRUG SCREEN, HOSP PERFORMED  SODIUM, URINE, RANDOM  OSMOLALITY, URINE    EKG EKG Interpretation  Date/Time:  Thursday November 16 2017 00:29:31 EDT Ventricular Rate:  64 PR Interval:    QRS Duration: 103 QT Interval:  428 QTC  Calculation: 442 R Axis:   31 Text Interpretation:  Sinus rhythm Low voltage, precordial leads Confirmed by Ripley Fraise ([id number]) on 11/16/2017 12:38:10 AM   Radiology Dg Chest 1 View  Result Date: 11/16/2017 CLINICAL DATA:  [place] to the floor.  Diffuse pain. EXAM: CHEST  1 VIEW COMPARISON:  04/25/2017 FINDINGS: The heart size and mediastinal contours are within normal limits. Both lungs are clear. The visualized skeletal structures are unremarkable. IMPRESSION: No active disease. Electronically Signed   By: Lucienne Capers M.D.   On: 11/16/2017 01:29   Dg Pelvis 1-2 Views  Result Date: 11/16/2017 CLINICAL DATA:  Left hip pain after fall to the floor. EXAM: PELVIS - 1-2 VIEW COMPARISON:  CT abdomen and pelvis 07/28/2016 FINDINGS: There is no evidence of pelvic fracture or diastasis. No pelvic bone lesions are seen. IMPRESSION: Negative. Electronically Signed   By: Lucienne Capers M.D.   On: 11/16/2017 01:29   Dg Ankle Complete Left  Result Date: 11/16/2017 CLINICAL DATA:  Left ankle pain after a fall. EXAM: LEFT ANKLE COMPLETE - 3+ VIEW COMPARISON:  Left foot 08/02/2017 FINDINGS: No evidence of acute fracture or dislocation of the left ankle. No focal bone lesion or bone destruction. Bone cortex appears intact. Degenerative changes in the intertarsal joints. Postoperative changes incidentally noted in the foot. Soft tissues are unremarkable. IMPRESSION: No acute bony abnormalities.  Degenerative changes. Electronically Signed   By: Lucienne Capers M.D.   On: 11/16/2017 01:32   Ct Head Wo Contrast  Result Date: 11/16/2017 CLINICAL DATA:  Posttraumatic headache. EXAM: CT HEAD WITHOUT CONTRAST CT CERVICAL SPINE WITHOUT CONTRAST TECHNIQUE: Multidetector CT imaging of the head and cervical spine was performed following the standard protocol without intravenous contrast. Multiplanar CT image reconstructions of the cervical spine were also generated. COMPARISON:  MRI brain 02/04/2014. CT head  05/16/2013. Cervical spine radiographs 11/25/2009 FINDINGS: CT HEAD FINDINGS Brain: No evidence of acute infarction, hemorrhage, hydrocephalus, extra-axial collection or mass lesion/mass effect. Mild cerebral atrophy. Mild ventricular dilatation consistent with central atrophy. Vascular: Mild intracranial arterial vascular calcifications. Skull: Calvarium appears intact. Sinuses/Orbits: Paranasal sinuses and mastoid air cells are clear. Other: None. CT CERVICAL SPINE FINDINGS Alignment: Slight anterior subluxation of C7 on T1, likely postoperative. This is unchanged since previous study. Normal alignment of the facet joints. C1-2 articulation appears intact. Skull base and vertebrae: Skull base appears intact. No vertebral compression deformities. No focal bone lesion or bone destruction. Soft tissues and spinal canal: No prevertebral soft tissue swelling. No paraspinal soft tissue mass or infiltration. Disc levels: Postoperative changes with anterior plate and screw fixation from C3-C5 and with intervertebral disc fusion from C3 through C7. Fused segments appear intact. Fusion hardware appears intact. Laminectomies at C6 and C7. Degenerative changes at C7-T1 with narrowed disc space and hypertrophic change. Upper chest: Emphysematous changes in the lung apices. Asymmetric hypoenhancing thyroid nodule on the right measuring up to 10 mm diameter. Based on size, no further evaluation is indicated at  this time. Other: None. IMPRESSION: 1. No acute intracranial abnormalities.  Cortical atrophy. 2. Postoperative changes in the cervical spine. Unchanged alignment since previous study. No acute displaced fractures identified. Electronically Signed   By: Lucienne Capers M.D.   On: 11/16/2017 02:03   Ct Cervical Spine Wo Contrast  Result Date: 11/16/2017 CLINICAL DATA:  Posttraumatic headache. EXAM: CT HEAD WITHOUT CONTRAST CT CERVICAL SPINE WITHOUT CONTRAST TECHNIQUE: Multidetector CT imaging of the head and cervical  spine was performed following the standard protocol without intravenous contrast. Multiplanar CT image reconstructions of the cervical spine were also generated. COMPARISON:  MRI brain 02/04/2014. CT head 05/16/2013. Cervical spine radiographs 11/25/2009 FINDINGS: CT HEAD FINDINGS Brain: No evidence of acute infarction, hemorrhage, hydrocephalus, extra-axial collection or mass lesion/mass effect. Mild cerebral atrophy. Mild ventricular dilatation consistent with central atrophy. Vascular: Mild intracranial arterial vascular calcifications. Skull: Calvarium appears intact. Sinuses/Orbits: Paranasal sinuses and mastoid air cells are clear. Other: None. CT CERVICAL SPINE FINDINGS Alignment: Slight anterior subluxation of C7 on T1, likely postoperative. This is unchanged since previous study. Normal alignment of the facet joints. C1-2 articulation appears intact. Skull base and vertebrae: Skull base appears intact. No vertebral compression deformities. No focal bone lesion or bone destruction. Soft tissues and spinal canal: No prevertebral soft tissue swelling. No paraspinal soft tissue mass or infiltration. Disc levels: Postoperative changes with anterior plate and screw fixation from C3-C5 and with intervertebral disc fusion from C3 through C7. Fused segments appear intact. Fusion hardware appears intact. Laminectomies at C6 and C7. Degenerative changes at C7-T1 with narrowed disc space and hypertrophic change. Upper chest: Emphysematous changes in the lung apices. Asymmetric hypoenhancing thyroid nodule on the right measuring up to 10 mm diameter. Based on size, no further evaluation is indicated at this time. Other: None. IMPRESSION: 1. No acute intracranial abnormalities.  Cortical atrophy. 2. Postoperative changes in the cervical spine. Unchanged alignment since previous study. No acute displaced fractures identified. Electronically Signed   By: Lucienne Capers M.D.   On: 11/16/2017 02:03   Dg Humerus  Left  Result Date: 11/16/2017 CLINICAL DATA:  Left shoulder pain after a fall. EXAM: LEFT HUMERUS - 2+ VIEW COMPARISON:  None. FINDINGS: There is no evidence of fracture or other focal bone lesions. Soft tissues are unremarkable. IMPRESSION: Negative. Electronically Signed   By: Lucienne Capers M.D.   On: 11/16/2017 01:33   Dg Foot Complete Left  Result Date: 11/16/2017 CLINICAL DATA:  Left foot pain after a fall to floor. EXAM: LEFT FOOT - COMPLETE 3+ VIEW COMPARISON:  08/02/2017 FINDINGS: Postoperative changes with prior left hallux valgus repair and silicon type implants in the second metatarsal phalangeal joint. Old resection or resorption of the distal aspect of the middle phalanx of the left third toe. No change in appearance since the previous study. No evidence of acute fracture or dislocation. No focal bone lesion or bone destruction. Soft tissues are unremarkable. IMPRESSION: No acute bony abnormalities.  Postoperative changes as described. Electronically Signed   By: Lucienne Capers M.D.   On: 11/16/2017 01:31    Procedures Procedures (including critical care time)  Medications Ordered in ED Medications  sodium chloride 0.9 % bolus 1,000 mL (has no administration in time range)  ketorolac (TORADOL) 30 MG/ML injection 15 mg (has no administration in time range)  fentaNYL (SUBLIMAZE) injection 50 mcg (50 mcg Intravenous Given 11/16/17 0100)     Initial Impression / Assessment and Plan / ED Course  I have reviewed the triage vital signs  and the nursing notes.  Pertinent labs & imaging results that were available during my care of the patient were reviewed by me and considered in my medical decision making (see chart for details).     2:07 AM Presents after fall, she thinks she lost her balance.  She is unknown if she had LOC.  Reports she has been instructed to drink a gallon of water a day, but at this point her sodium and chloride are both low.  She will be admitted to the  hospital All imaging is negative 2:41 AM Patient improved, awake and alert no distress Patient found to have significant hyponatremia to require admission and treatment.  Discussed with Dr. Hal Hope for admission Final Clinical Impressions(s) / ED Diagnoses   Final diagnoses:  Hyponatremia  Concussion with loss of consciousness, initial encounter  Acute cervical myofascial strain, initial encounter  Sprain of left foot, initial encounter  Sprain of left ankle, unspecified ligament, initial encounter    ED Discharge Orders    None       Ripley Fraise, MD 11/16/17 [phone]

## 2017-11-16 NOTE — H&P (Signed)
History and Physical    Erin Lopez ERD:[id number] DOB: Jul 08, 1950 DOA: 11/16/2017  PCP: Tammi Sou, MD  Patient coming from: Home.  Chief Complaint: Fall.  HPI: Erin Lopez is a 67 y.o. female with history of bipolar disorder, hypertension, COPD, chronic pain had a fall at home after tripping.  Denies losing consciousness.  Following the fall patient had pain and around the upper back area.  EMS was called and patient was brought to the ER.  Denies any chest pain or shortness of breath nausea vomiting or diarrhea.  Patient states recently she has been drinking a lot of fluid as instructed by patient's urologist for recurrent UTI.  Has been going on for last 1 month almost drinking 1 gallon.  No new medication patient is taking his potassium citrate.  ED Course: In the ER patient's sodium was found to be around 120.  CT head and neck and x-rays did not show any fractures.  Patient was given 1 L fluid bolus by the ER physician since patient's sodium was low along with chloride.  Patient admitted for fall and severe hyponatremia.  Review of Systems: As per HPI, rest all negative.   Past Medical History:  Diagnosis Date  . Alcoholism in remission [facility])    states last alcohol 11 yrs ago  . Anxiety   . Asymptomatic gallstones   . Bipolar 1 disorder [facility])    sees a psychiatrist  . Cervical spondylosis without myelopathy 12/19/2013   MRI 02/2014: multilevel DDD/spondylosis, not much change compared to prior MRI.  Pt not in favor of invasive therapy for her neck as of 04/2014.  Marland Kitchen Chronic renal insufficiency, stage II (mild)   . COPD (chronic obstructive pulmonary disease) (HCC)    Moderate: anoro started 04/2017 by pulm, pt symptomatically improved and PFTs stable at f/u 06/2017--pulm f/u planned 1 yr.  . Endometritis 05/2017   possible; empiric tx with Flagyl by GYN.  . Environmental allergies   . Fibromyalgia   . GERD (gastroesophageal reflux disease)   . Herpes zoster  08/07/2017   L scapular region  . Hiatal hernia   . History of adenomatous polyp of colon 04/2008; 08/2014   No high grade dysplasia: recall 5 yrs ([name], [name])  . History of basal cell carcinoma excision    NOSE  . History of Helicobacter pylori infection 04/2008   +gastric biopsy (gastritis but no metaplasia, dysplasia, or malignancy identified)  . History of TIA (transient ischemic attack)    secondary to HELLP syndrome 1994 post c/s--  residual memory loss  . Hyperlipidemia    no meds in > a decade per pt.  Recommended statin 06/21/16.  Marland Kitchen Hypertension   . IFG (impaired fasting glucose) 06/2016   Fasting gluc 105; HbA1c at that time was 6.0%.  . Internal hemorrhoids   . 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 04/2017.  . Memory loss    Abnl MRI brain and CT brain c/w chronic microvascular ischemia.  Repeat MRI brain 02/2014 stable (Dr. Mayra Reel with no plan to f/u with neuro as of 04/2014)  . Osteoporosis 05/2010   2012 'penia; 06/2015 'porosis:  prolia.  repeat DEXA 2 yrs.  As of 12/2016 pt going to discuss w/GYN whether or not to continue prolia.  DEXA 09/2017 T-score -2.2 femur neck.  . Pelvic floor  dysfunction    [facility] ([name])  . Postmenopausal vaginal bleeding 05/2017   x 1 small episode; GYN attempted endo bx but unable to penetrate cervix due to severe cerv stenosis (due to postmenopausal state + hx of LEEP).  Endo u/s showed thin endo lining.  Per GYN---no evidence of endomet pathology on w/u---obs as of 07/2017.  Marland Kitchen Recurrent kidney stones 08/2013   Left ureteral calculus: Perc nephr--cystoscopy w/ureteroscopy + laser for stone removal.  Residual asymptomatic left renal nephrolithiasis <5mm present post-procedure.  Right sided hydronephrosis-persistent (on u/s)--urol ordered CT  to further eval 08/2016: 1.6 cm nonobstructing stone lower pole left kidney, no hydro, plan for PCN extraction ([facility])  . Shortness of breath   . Sprain of neck 12/19/2013  . TMJ (temporomandibular joint disorder)    USES  MOUTH GUARD  . Vitamin D deficiency 05/2011  . Weak urinary stream 07/2016   with elevated PVR and mild left hydronephrosis secondary to this ([facility] started flomax 0.4mg  qd for this).    Past Surgical History:  Procedure Laterality Date  . ANTERIOR CERVICAL DECOMP/DISCECTOMY FUSION  06-30-2009   C3 -- C5  AND EXPLORATION OF FUSION C5-7 W/  PLATE REMOVAL  . CERVICAL FUSION  2002   anterior C5 -- C7  . CERVICAL SPINE SURGERY  08-27-1999     C6 -- T1  LAMINECTOMY/  DISKECTOMY  . CESAREAN SECTION  1994  . COLONOSCOPY W/ POLYPECTOMY  04/2008;08/2014   2016 tubular adenoma x 1; +diverticulosis and int/ext hemorrhoids.  Recall 5 yrs  . CYSTOSCOPY WITH URETEROSCOPY AND STENT PLACEMENT Left 08/28/2013   Procedure: CYSTOSCOPY, RGP,  WITH URETEROSCOPY AND STENT REMOVAL;  Surgeon: [name], MD;  Location: [facility];  Service: Urology;  Laterality: Left;  . DEXA  09/2017   T-score -2.2 femur neck: improved compared to 06/2015.  Marland Kitchen ESOPHAGOGASTRODUODENOSCOPY  2010; 08/2014   +Candidal esophagitis; Mild chronic gastritis w/intestinal metaplasia--NEG H pylori, neg for eosinophilic esoph  . HOLMIUM LASER APPLICATION Left 09/23/2977   Procedure: HOLMIUM LASER APPLICATION;  Surgeon: [name], MD;  Location: [facility];  Service: Urology;  Laterality: Left;  . NEGATIVE SLEEP STUDY  04-01-2007  . NEPHROLITHOTOMY Left 08/05/2013   Procedure: NEPHROLITHOTOMY PERCUTANEOUS;  Surgeon: [name], MD;  Location: [facility];  Service: Urology;  Laterality: Left;  . TONSILLECTOMY AND ADENOIDECTOMY  1975  . TOTAL KNEE ARTHROPLASTY Right 2003  . ULTRASOUND EXAM,PELVIC COMPLETE ([place] [facility])  06/25/15   NORMAL (done by GYN)     reports that she quit  smoking about 24 years ago. Her smoking use included cigarettes. She has a 33.00 pack-year smoking history. She has never used smokeless tobacco. She reports that she does not drink alcohol or use drugs.  Allergies  Allergen Reactions  . Ceftin [Cefuroxime Axetil] Anaphylaxis    Swelling of eyes and tongue  . Ciprofloxacin Anaphylaxis    Difficulty breathing and generalized swelling  . Fosamax [Alendronate Sodium] Diarrhea  . Morphine And Related Other (See Comments)    Hallucinations/ disoriented - pt stated, "Only Morphine" - 03/23/17  . Prednisone Other (See Comments)    Hyperactivity  . Seroquel [Quetiapine] Other (See Comments)    oversedation    Family History  Problem Relation Age of Onset  . Alcohol abuse Mother   . Arthritis Mother   . Hypertension Mother   . Hyperlipidemia Mother   . Diabetes Mother   . Parkinsonism Mother   . Breast cancer  Mother   . Prostate cancer Father   . Hypertension Father   . Hyperlipidemia Father   . Diabetes Father   . Heart disease Father   . Esophageal cancer Sister   . Colon polyps Sister   . Colon polyps Brother   . Heart disease Brother        x 2  . Irritable bowel syndrome Sister     Prior to Admission medications   Medication Sig Start Date End Date Taking? Authorizing Provider  albuterol (PROAIR HFA) 108 (90 Base) MCG/ACT inhaler INHALE 2 PUFFS INTO THE LUNGS EVERY 4 (FOUR) HOURS AS NEEDED FOR WHEEZING OR SHORTNESS OF BREATH. Patient taking differently: Inhale 2 puffs into the lungs every 4 (four) hours as needed for wheezing or shortness of breath.  09/17/15  Yes McGowen, Adrian Blackwater, MD  ALPRAZolam Duanne Moron) 1 MG tablet Take 1 mg by mouth 3 (three) times daily as needed for anxiety.    Yes [provider]  ANORO ELLIPTA 62.5-25 MCG/INH AEPB TAKE 1 PUFF BY MOUTH EVERY DAY Patient taking differently: Inhale 1 puff into the lungs daily.  10/12/17  Yes Parrett, Tammy S, NP  atorvastatin (LIPITOR) 20 MG tablet TAKE 1 TABLET  (20 MG TOTAL) BY MOUTH DAILY. 07/28/17  Yes McGowen, Adrian Blackwater, MD  carvedilol (COREG) 25 MG tablet TAKE 1/2 TAB BY MOUTH TWICE A DAY Patient taking differently: Take 12.5 mg by mouth 2 (two) times daily with a meal.  07/31/17  Yes McGowen, Adrian Blackwater, MD  cetirizine (ZYRTEC) 10 MG tablet Take 10 mg by mouth every morning.    Yes [provider]  chlorthalidone (HYGROTON) 25 MG tablet TAKE 1 TABLET (25 MG TOTAL) BY MOUTH DAILY. 09/27/17  Yes McGowen, Adrian Blackwater, MD  cyclobenzaprine (FLEXERIL) 10 MG tablet Take 10 mg by mouth 2 (two) times daily as needed for muscle spasms.  06/02/15  Yes [provider]  doxycycline (VIBRAMYCIN) 50 MG capsule Take 50 mg by mouth 2 (two) times daily.  06/23/17  Yes [provider]  fluticasone (FLONASE) 50 MCG/ACT nasal spray Place 2 sprays into both nostrils daily. 10/21/16  Yes McGowen, Adrian Blackwater, MD  lamoTRIgine (LAMICTAL) 100 MG tablet TAKE 3 TABLETS (300 MG TOTAL) BY MOUTH AT BEDTIME. 09/25/17  Yes McGowen, Adrian Blackwater, MD  lisinopril (PRINIVIL,ZESTRIL) 40 MG tablet TAKE 1 TABLET (40 MG TOTAL) BY MOUTH DAILY. Patient taking differently: Take 20 mg by mouth 2 (two) times daily.  06/06/17 06/06/18 Yes McGowen, Adrian Blackwater, MD  montelukast (SINGULAIR) 10 MG tablet TAKE 1 TABLET (10 MG TOTAL) BY MOUTH EVERY EVENING. 03/15/17  Yes McGowen, Adrian Blackwater, MD  Oxycodone HCl 10 MG TABS Take 1 tablet by mouth every 6 (six) hours as needed (severe pain).  06/10/15  Yes [provider]  polyethylene glycol powder (GLYCOLAX/MIRALAX) powder TAKE 17 G BY MOUTH DAILY. 06/19/17  Yes McGowen, Adrian Blackwater, MD  potassium citrate (UROCIT-K) 10 MEQ (1080 MG) SR tablet Take 20 mEq by mouth 2 (two) times daily. 10/24/17  Yes [provider]  tamsulosin (FLOMAX) 0.4 MG CAPS capsule Take 0.4 mg by mouth daily. 06/29/17  Yes [provider]  valACYclovir (VALTREX) 1000 MG tablet Take 1 tablet (1,000 mg total) by mouth 3 (three) times daily. 08/07/17  Yes Brunetta Jeans,  PA-C  venlafaxine XR (EFFEXOR-XR) 150 MG 24 hr capsule TAKE ONE CAPSULE BY MOUTH EVERY DAY WITH BREAKFAST 10/25/17  Yes McGowen, Adrian Blackwater, MD  zolpidem (AMBIEN) 10 MG tablet Take 10  mg by mouth at bedtime.   Yes [provider]  ranitidine (ZANTAC) 150 MG capsule Take 150 mg by mouth 2 (two) times daily as needed.     [provider]    Physical Exam: Vitals:   11/16/17 0145 11/16/17 0215 11/16/17 0300 11/16/17 0315  BP: 113/61 111/69 112/64 122/72  Pulse: 65 67 61 67  Resp: 12 12 15 17   Temp:      TempSrc:      SpO2: 100% 98% 100% 100%  Weight:      Height:          Constitutional: Moderately built and nourished. Vitals:   11/16/17 0145 11/16/17 0215 11/16/17 0300 11/16/17 0315  BP: 113/61 111/69 112/64 122/72  Pulse: 65 67 61 67  Resp: 12 12 15 17   Temp:      TempSrc:      SpO2: 100% 98% 100% 100%  Weight:      Height:       Eyes: Anicteric no pallor. ENMT: No discharge from the ears eyes nose or mouth. Neck: No mass felt.  No neck rigidity. Respiratory: No rhonchi or crepitations. Cardiovascular: S1-S2 heard no murmurs appreciated. Abdomen: Soft nontender bowel sounds present. Musculoskeletal: No edema.  No joint effusion. Skin: No rash.  Skin appears warm. Neurologic: Alert awake oriented to time place and person.  Moves all extremities. Psychiatric: Appears normal per normal affect.   Labs on Admission: I have personally reviewed following labs and imaging studies  CBC: Recent Labs  Lab 11/16/17 0032  WBC 6.3  HGB 9.9*  HCT 28.2*  MCV 86.8  PLT 169   Basic Metabolic Panel: Recent Labs  Lab 11/16/17 0032  NA 120*  K 3.6  CL 81*  CO2 29  GLUCOSE 103*  BUN 14  CREATININE 1.01*  CALCIUM 9.5   GFR: Estimated Creatinine Clearance: 57.4 mL/min (A) (by C-G formula based on SCr of 1.01 mg/dL (H)). Liver Function Tests: No results for input(s): AST, ALT, ALKPHOS, BILITOT, PROT, ALBUMIN in the last 168 hours. No results for  input(s): LIPASE, AMYLASE in the last 168 hours. No results for input(s): AMMONIA in the last 168 hours. Coagulation Profile: No results for input(s): INR, PROTIME in the last 168 hours. Cardiac Enzymes: Recent Labs  Lab 11/16/17 0032  TROPONINI <0.03   BNP (last 3 results) No results for input(s): PROBNP in the last 8760 hours. HbA1C: No results for input(s): HGBA1C in the last 72 hours. CBG: Recent Labs  Lab 11/16/17 0039  GLUCAP 104*   Lipid Profile: No results for input(s): CHOL, HDL, LDLCALC, TRIG, CHOLHDL, LDLDIRECT in the last 72 hours. Thyroid Function Tests: No results for input(s): TSH, T4TOTAL, FREET4, T3FREE, THYROIDAB in the last 72 hours. Anemia Panel: No results for input(s): VITAMINB12, FOLATE, FERRITIN, TIBC, IRON, RETICCTPCT in the last 72 hours. Urine analysis:    Component Value Date/Time   COLORURINE COLORLESS (A) 11/16/2017 0032   APPEARANCEUR CLEAR 11/16/2017 0032   LABSPEC 1.002 (L) 11/16/2017 0032   PHURINE 8.0 11/16/2017 0032   GLUCOSEU NEGATIVE 11/16/2017 0032   GLUCOSEU NEGATIVE 05/29/2013 1156   HGBUR SMALL (A) 11/16/2017 0032   BILIRUBINUR NEGATIVE 11/16/2017 0032   BILIRUBINUR neg 06/05/2015 1510   KETONESUR NEGATIVE 11/16/2017 0032   PROTEINUR NEGATIVE 11/16/2017 0032   UROBILINOGEN 0.2 06/05/2015 1510   UROBILINOGEN 0.2 08/10/2013 1611   NITRITE NEGATIVE 11/16/2017 0032   LEUKOCYTESUR NEGATIVE 11/16/2017 0032   Sepsis Labs: @LABRCNTIP (procalcitonin:4,lacticidven:4) )No results found for this or any  previous visit (from the past 240 hour(s)).   Radiological Exams on Admission: Dg Chest 1 View  Result Date: 11/16/2017 CLINICAL DATA:  [place] to the floor.  Diffuse pain. EXAM: CHEST  1 VIEW COMPARISON:  04/25/2017 FINDINGS: The heart size and mediastinal contours are within normal limits. Both lungs are clear. The visualized skeletal structures are unremarkable. IMPRESSION: No active disease. Electronically Signed   By: Lucienne Capers  M.D.   On: 11/16/2017 01:29   Dg Pelvis 1-2 Views  Result Date: 11/16/2017 CLINICAL DATA:  Left hip pain after fall to the floor. EXAM: PELVIS - 1-2 VIEW COMPARISON:  CT abdomen and pelvis 07/28/2016 FINDINGS: There is no evidence of pelvic fracture or diastasis. No pelvic bone lesions are seen. IMPRESSION: Negative. Electronically Signed   By: Lucienne Capers M.D.   On: 11/16/2017 01:29   Dg Ankle Complete Left  Result Date: 11/16/2017 CLINICAL DATA:  Left ankle pain after a fall. EXAM: LEFT ANKLE COMPLETE - 3+ VIEW COMPARISON:  Left foot 08/02/2017 FINDINGS: No evidence of acute fracture or dislocation of the left ankle. No focal bone lesion or bone destruction. Bone cortex appears intact. Degenerative changes in the intertarsal joints. Postoperative changes incidentally noted in the foot. Soft tissues are unremarkable. IMPRESSION: No acute bony abnormalities.  Degenerative changes. Electronically Signed   By: Lucienne Capers M.D.   On: 11/16/2017 01:32   Ct Head Wo Contrast  Result Date: 11/16/2017 CLINICAL DATA:  Posttraumatic headache. EXAM: CT HEAD WITHOUT CONTRAST CT CERVICAL SPINE WITHOUT CONTRAST TECHNIQUE: Multidetector CT imaging of the head and cervical spine was performed following the standard protocol without intravenous contrast. Multiplanar CT image reconstructions of the cervical spine were also generated. COMPARISON:  MRI brain 02/04/2014. CT head 05/16/2013. Cervical spine radiographs 11/25/2009 FINDINGS: CT HEAD FINDINGS Brain: No evidence of acute infarction, hemorrhage, hydrocephalus, extra-axial collection or mass lesion/mass effect. Mild cerebral atrophy. Mild ventricular dilatation consistent with central atrophy. Vascular: Mild intracranial arterial vascular calcifications. Skull: Calvarium appears intact. Sinuses/Orbits: Paranasal sinuses and mastoid air cells are clear. Other: None. CT CERVICAL SPINE FINDINGS Alignment: Slight anterior subluxation of C7 on T1, likely  postoperative. This is unchanged since previous study. Normal alignment of the facet joints. C1-2 articulation appears intact. Skull base and vertebrae: Skull base appears intact. No vertebral compression deformities. No focal bone lesion or bone destruction. Soft tissues and spinal canal: No prevertebral soft tissue swelling. No paraspinal soft tissue mass or infiltration. Disc levels: Postoperative changes with anterior plate and screw fixation from C3-C5 and with intervertebral disc fusion from C3 through C7. Fused segments appear intact. Fusion hardware appears intact. Laminectomies at C6 and C7. Degenerative changes at C7-T1 with narrowed disc space and hypertrophic change. Upper chest: Emphysematous changes in the lung apices. Asymmetric hypoenhancing thyroid nodule on the right measuring up to 10 mm diameter. Based on size, no further evaluation is indicated at this time. Other: None. IMPRESSION: 1. No acute intracranial abnormalities.  Cortical atrophy. 2. Postoperative changes in the cervical spine. Unchanged alignment since previous study. No acute displaced fractures identified. Electronically Signed   By: Lucienne Capers M.D.   On: 11/16/2017 02:03   Ct Cervical Spine Wo Contrast  Result Date: 11/16/2017 CLINICAL DATA:  Posttraumatic headache. EXAM: CT HEAD WITHOUT CONTRAST CT CERVICAL SPINE WITHOUT CONTRAST TECHNIQUE: Multidetector CT imaging of the head and cervical spine was performed following the standard protocol without intravenous contrast. Multiplanar CT image reconstructions of the cervical spine were also generated. COMPARISON:  MRI brain 02/04/2014.  CT head 05/16/2013. Cervical spine radiographs 11/25/2009 FINDINGS: CT HEAD FINDINGS Brain: No evidence of acute infarction, hemorrhage, hydrocephalus, extra-axial collection or mass lesion/mass effect. Mild cerebral atrophy. Mild ventricular dilatation consistent with central atrophy. Vascular: Mild intracranial arterial vascular  calcifications. Skull: Calvarium appears intact. Sinuses/Orbits: Paranasal sinuses and mastoid air cells are clear. Other: None. CT CERVICAL SPINE FINDINGS Alignment: Slight anterior subluxation of C7 on T1, likely postoperative. This is unchanged since previous study. Normal alignment of the facet joints. C1-2 articulation appears intact. Skull base and vertebrae: Skull base appears intact. No vertebral compression deformities. No focal bone lesion or bone destruction. Soft tissues and spinal canal: No prevertebral soft tissue swelling. No paraspinal soft tissue mass or infiltration. Disc levels: Postoperative changes with anterior plate and screw fixation from C3-C5 and with intervertebral disc fusion from C3 through C7. Fused segments appear intact. Fusion hardware appears intact. Laminectomies at C6 and C7. Degenerative changes at C7-T1 with narrowed disc space and hypertrophic change. Upper chest: Emphysematous changes in the lung apices. Asymmetric hypoenhancing thyroid nodule on the right measuring up to 10 mm diameter. Based on size, no further evaluation is indicated at this time. Other: None. IMPRESSION: 1. No acute intracranial abnormalities.  Cortical atrophy. 2. Postoperative changes in the cervical spine. Unchanged alignment since previous study. No acute displaced fractures identified. Electronically Signed   By: Lucienne Capers M.D.   On: 11/16/2017 02:03   Dg Humerus Left  Result Date: 11/16/2017 CLINICAL DATA:  Left shoulder pain after a fall. EXAM: LEFT HUMERUS - 2+ VIEW COMPARISON:  None. FINDINGS: There is no evidence of fracture or other focal bone lesions. Soft tissues are unremarkable. IMPRESSION: Negative. Electronically Signed   By: Lucienne Capers M.D.   On: 11/16/2017 01:33   Dg Foot Complete Left  Result Date: 11/16/2017 CLINICAL DATA:  Left foot pain after a fall to floor. EXAM: LEFT FOOT - COMPLETE 3+ VIEW COMPARISON:  08/02/2017 FINDINGS: Postoperative changes with prior  left hallux valgus repair and silicon type implants in the second metatarsal phalangeal joint. Old resection or resorption of the distal aspect of the middle phalanx of the left third toe. No change in appearance since the previous study. No evidence of acute fracture or dislocation. No focal bone lesion or bone destruction. Soft tissues are unremarkable. IMPRESSION: No acute bony abnormalities.  Postoperative changes as described. Electronically Signed   By: Lucienne Capers M.D.   On: 11/16/2017 01:31    EKG: Independently reviewed.  Normal sinus rhythm.  Assessment/Plan Principal Problem:   Hyponatremia Active Problems:   Benign essential HTN   COPD (chronic obstructive pulmonary disease) (HCC)   Memory difficulties   Bipolar 1 disorder (HCC)   Stage 2 chronic kidney disease    1. Severe hyponatremia -patient was given 1 L fluid bolus in the ER.  Hyponatremia could be multifactorial including patient drinking a lot of fluid and also patient being on hydrochlorothiazide.  Check urine sodium osmolality serum osmolality TSH cortisol levels and closely follow metabolic panel.  I have placed patient on fluid restriction of 800 cc/day for now.  Will discontinue hydrochlorothiazide due to severe hyponatremia.  Patient is also on Effexor which I am continuing for now but if patient continues to remain hyponatremic then may have to discontinue. 2. COPD not actively wheezing continue inhalers. 3. Hypertension holding hydrochlorothiazide due to hyponatremia.  Will continue lisinopril Coreg and PRN IV hydralazine. 4. Bipolar disorder on Lamictal and Effexor.  See #1 regarding Effexor. 5. Chronic kidney  disease stage II -follow metabolic panel. 6. Chronic pain on oxycodone.   DVT prophylaxis: Lovenox. Code Status: Full code. Family Communication: Discussed with patient. Disposition Plan: Home. Consults called: Physical therapy. Admission status: Observation.   Rise Patience MD [facility] Pager [phone].  If 7PM-7AM, please contact night-coverage [email or web address] Password [facility]  11/16/2017, 3:41 AM

## 2017-11-16 NOTE — ED Triage Notes (Signed)
Pt coming from home by ems. Pt around 1930 last night was walking out the house when pt tripped and fell and hit her left side of head and body. Pt unsure of LOC but was walking around after the fall. When ems was call around 2300 pt was axox2 to person and place. Upon arrival to ed pt is axox4 but c/o left sided headache and foot pain.

## 2017-11-16 NOTE — ED Notes (Signed)
Attempted report 

## 2017-11-16 NOTE — Progress Notes (Signed)
Patient is a 61 female with history of bipolar disorder, hypertension, COPD, chronic fall syndrome who presented to the emergency department after she fell at home.  Patient was found to be severely hyponatremic on presentation.  She was reportedly drinking lots of fluids as instructed by her urologist for recurrent UTI.  Patient was admitted for the management of hyponatremia. Patient seen and examined the bedside this morning.  She is medically stable.  Denies any complaints at the moment.  Her mental status is on baseline.  We have started her on IV fluids and salt tablets.  We will continue to monitor the patient.  Patient seen by Dr. Hal Hope this morning.

## 2017-11-16 NOTE — Evaluation (Signed)
Physical Therapy Evaluation Patient Details Name: Erin Lopez MRN: [id number] DOB: Nov 08, 1950 Today's Date: 11/16/2017   History of Present Illness  Pt is a 67 y.o. F with significant PMH of bipolar disorder, hypertension, COPD, chronic pain who had a fall at home after tripping. Admitted after a fall and finding of severe hyponatremia.  Clinical Impression  Pt admitted with above diagnosis. Pt currently with functional limitations due to the deficits listed below (see PT Problem List). Patient lives alone and is independent at baseline with mobility and ADL's but does report history of a couple falls. Currently ambulating in room with no assistive device and supervision, demonstrating decreased gait speed and balance deficits. When attempting to discuss discharge planning, patient refused, stating, "I don't want to talk about that right now. I just want to stay here." Pt will benefit from skilled PT to increase their independence and safety with mobility to allow discharge to the venue listed below.        Follow Up Recommendations Home health PT;Supervision for mobility/OOB    Equipment Recommendations  None recommended by PT    Recommendations for Other Services       Precautions / Restrictions Precautions Precautions: Fall Restrictions Weight Bearing Restrictions: No      Mobility  Bed Mobility Overal bed mobility: Modified Independent             General bed mobility comments: increased time with HOB elevated  Transfers Overall transfer level: Needs assistance Equipment used: None Transfers: Sit to/from Stand Sit to Stand: Min guard         General transfer comment: Min guard assist provided from low surface of toilet. cues for foot placement  Ambulation/Gait Ambulation/Gait assistance: Supervision Gait Distance (Feet): 15 Feet Assistive device: None Gait Pattern/deviations: Step-through pattern;Decreased stride length;Decreased dorsiflexion -  right;Decreased dorsiflexion - left Gait velocity: decr Gait velocity interpretation: <1.31 ft/sec, indicative of household ambulator General Gait Details: Patient with decreased bilateral foot clearance and occasionally holding onto objects in room for additional support. Slowed gait speed but overall no overt LOB  Stairs            Wheelchair Mobility    Modified Rankin (Stroke Patients Only)       Balance Overall balance assessment: Needs assistance Sitting-balance support: No upper extremity supported;Feet supported Sitting balance-Leahy Scale: Normal     Standing balance support: No upper extremity supported;During functional activity Standing balance-Leahy Scale: Fair                               Pertinent Vitals/Pain Pain Assessment: No/denies pain("just tired.")    Home Living Family/patient expects to be discharged to:: Private residence Living Arrangements: Alone Available Help at Discharge: Family;Available PRN/intermittently Type of Home: House Home Access: Level entry     Home Layout: One level Home Equipment: Walker - 2 wheels      Prior Function Level of Independence: Independent         Comments: Does report 2 falls recently     Hand Dominance        Extremity/Trunk Assessment   Upper Extremity Assessment Upper Extremity Assessment: Overall WFL for tasks assessed(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       General Comments      Exercises     Assessment/Plan    PT Assessment Patient needs continued PT services  PT Problem List Decreased strength;Decreased activity tolerance;Decreased balance;Decreased mobility;Decreased cognition;Decreased safety awareness       PT Treatment Interventions DME instruction;Gait training;Stair training;Functional mobility training;Therapeutic activities;Therapeutic exercise;Balance training;Patient/family education    PT Goals (Current goals can be found in the Care Plan section)  Acute Rehab PT Goals Patient Stated Goal: pt agreeable to therapy but states she does not want to discuss discharge planning PT Goal Formulation: With patient Time For Goal Achievement: 11/30/17 Potential to Achieve Goals: Good    Frequency Min 3X/week   Barriers to discharge        Co-evaluation               AM-PAC PT "6 Clicks" Daily Activity  Outcome Measure Difficulty turning over in bed (including adjusting bedclothes, sheets and blankets)?: None Difficulty moving from lying on back to sitting on the side of the bed? : None Difficulty sitting down on and standing up from a chair with arms (e.g., wheelchair, bedside commode, etc,.)?: A Little Help needed moving to and from a bed to chair (including a wheelchair)?: A Little Help needed walking in hospital room?: A Little Help needed climbing 3-5 steps with a railing? : A Little 6 Click Score: 20    End of Session   Activity Tolerance: Patient tolerated treatment well Patient left: in chair;with call bell/phone within reach Nurse Communication: Mobility status PT Visit Diagnosis: Unsteadiness on feet (R26.81);Difficulty in walking, not elsewhere classified (R26.2)    Time: 0315-9458 PT Time Calculation (min) (ACUTE ONLY): 29 min   Charges:   PT Evaluation $PT Eval Moderate Complexity: 1 Mod PT Treatments $Therapeutic Activity: 8-22 mins        Ellamae Sia, PT, DPT Acute Rehabilitation Services  Pager: [place] 11/16/2017, 1:51 PM

## 2017-11-16 NOTE — Plan of Care (Signed)
  Problem: Education: Goal: Knowledge of General Education information will improve Description Including pain rating scale, medication(s)/side effects and non-pharmacologic comfort measures Outcome: Progressing   

## 2017-11-16 NOTE — ED Notes (Signed)
Urine culture sent down at this time.

## 2017-11-17 ENCOUNTER — Ambulatory Visit: Payer: Medicare Other | Admitting: Family Medicine

## 2017-11-17 DIAGNOSIS — E871 Hypo-osmolality and hyponatremia: Secondary | ICD-10-CM | POA: Diagnosis not present

## 2017-11-17 MED ORDER — SODIUM CHLORIDE 1 G PO TABS
2.0000 g | ORAL_TABLET | Freq: Three times a day (TID) | ORAL | Status: DC
  Administered 2017-11-17 – 2017-11-18 (×3): 2 g via ORAL
  Filled 2017-11-17 (×4): qty 2

## 2017-11-17 NOTE — Plan of Care (Signed)
  Problem: Education: Goal: Knowledge of General Education information will improve Description Including pain rating scale, medication(s)/side effects and non-pharmacologic comfort measures Outcome: Progressing   Problem: Health Behavior/Discharge Planning: Goal: Ability to manage health-related needs will improve Outcome: Progressing   

## 2017-11-17 NOTE — Progress Notes (Signed)
PROGRESS NOTE    Erin Lopez  WJX:[id number] DOB: 1950/11/06 DOA: 11/16/2017 PCP: Tammi Sou, MD   Brief Narrative: Patient is a 89 female with history of bipolar disorder, hypertension, COPD, chronic fall syndrome who presented to the emergency department after she fell at home.  Patient was found to be severely hyponatremic on presentation.  She was reportedly drinking lots of fluids as instructed by her urologist for recurrent UTI.  Patient was admitted for the management of hyponatremia.  Assessment & Plan:   Principal Problem:   Hyponatremia Active Problems:   Benign essential HTN   COPD (chronic obstructive pulmonary disease) (HCC)   Memory difficulties   Bipolar 1 disorder (HCC)   Stage 2 chronic kidney disease  Severe hyponatremia: Secondary to increase intake of free water at home.  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  Continue to restrict fluid to less than 800 cc a day.  Started on salt tablets.  Sodium level improving.  Effexor , chlorthalidone on hold  COPD: Currently stable  Hypertension: On chlorthalidone at home which is on hold.  Will continue lisinopril, .  Currently blood pressure stable  Bipolar disorder: On Lamictal and Effexor.  Effexor currently held  CKD stage II: Currently kidney function stable and is on baseline.  Chronic pain syndrome: Oxycodone  UTI: Was taking doxycycline as an outpatient   DVT prophylaxis: Lovenox Code Status: Full Family Communication: None present at the bedside Disposition Plan: Home tomorrow after improvement in the sodium level   Consultants: None  Procedures: None  Antimicrobials: Doxycycline  Subjective: Patient seen and examined the bedside this morning.  No acute issues/events.  Mental status is on baseline.  She was still drinking a lot of free water .I have suggested not to do so.  Objective: Vitals:   11/16/17 2225 11/17/17 0457 11/17/17 0801 11/17/17 0900  BP: 97/67 (!) 102/52  121/64    Pulse:  72  85  Resp:  18    Temp:  97.7 F (36.5 C)  98.1 F (36.7 C)  TempSrc:  Oral  Oral  SpO2:  97% 97%   Weight:      Height:        Intake/Output Summary (Last 24 hours) at 11/17/2017 1256 Last data filed at 11/17/2017 1215 Gross per 24 hour  Intake 1020 ml  Output 1225 ml  Net -205 ml   Filed Weights   11/16/17 0030 11/16/17 2056  Weight: 72.6 kg 72.6 kg    Examination:  General exam: Appears calm and comfortable ,Not in distress,average built HEENT:PERRL,Oral mucosa moist, Ear/Nose normal on gross exam Respiratory system: Bilateral equal air entry, normal vesicular breath sounds, no wheezes or crackles  Cardiovascular system: S1 & S2 heard, RRR. No JVD, murmurs, rubs, gallops or clicks. No pedal edema. Gastrointestinal system: Abdomen is nondistended, soft and nontender. No organomegaly or masses felt. Normal bowel sounds heard. Central nervous system: Alert and oriented. No focal neurological deficits. Extremities: No edema, no clubbing ,no cyanosis, distal peripheral pulses palpable. Skin: No rashes, lesions or ulcers,no icterus ,no pallor MSK: Normal muscle bulk,tone ,power Psychiatry: Judgement and insight appear normal. Mood & affect appropriate.     Data Reviewed: I have personally reviewed following labs and imaging studies  CBC: Recent Labs  Lab 11/16/17 0032 11/16/17 0623  WBC 6.3 5.0  HGB 9.9* 9.2*  HCT 28.2* 25.7*  MCV 86.8 86.5  PLT 204 213   Basic Metabolic Panel: Recent Labs  Lab 11/16/17 0032 11/16/17 0865  11/16/17 1032  NA 120* 124* 125*  K 3.6 3.6 3.6  CL 81* 89* 88*  CO2 29 28 28   GLUCOSE 103* 95 85  BUN 14 13 13   CREATININE 1.01* 1.00 1.04*  CALCIUM 9.5 8.8* 9.3   GFR: Estimated Creatinine Clearance: 55.7 mL/min (A) (by C-G formula based on SCr of 1.04 mg/dL (H)). Liver Function Tests: No results for input(s): AST, ALT, ALKPHOS, BILITOT, PROT, ALBUMIN in the last 168 hours. No results for input(s): LIPASE, AMYLASE in  the last 168 hours. No results for input(s): AMMONIA in the last 168 hours. Coagulation Profile: No results for input(s): INR, PROTIME in the last 168 hours. Cardiac Enzymes: Recent Labs  Lab 11/16/17 0032  TROPONINI <0.03   BNP (last 3 results) No results for input(s): PROBNP in the last 8760 hours. HbA1C: No results for input(s): HGBA1C in the last 72 hours. CBG: Recent Labs  Lab 11/16/17 0039  GLUCAP 104*   Lipid Profile: No results for input(s): CHOL, HDL, LDLCALC, TRIG, CHOLHDL, LDLDIRECT in the last 72 hours. Thyroid Function Tests: Recent Labs    11/16/17 0623  TSH 1.766   Anemia Panel: No results for input(s): VITAMINB12, FOLATE, FERRITIN, TIBC, IRON, RETICCTPCT in the last 72 hours. Sepsis Labs: No results for input(s): PROCALCITON, LATICACIDVEN in the last 168 hours.  No results found for this or any previous visit (from the past 240 hour(s)).       Radiology Studies: Dg Chest 1 View  Result Date: 11/16/2017 CLINICAL DATA:  [place] to the floor.  Diffuse pain. EXAM: CHEST  1 VIEW COMPARISON:  04/25/2017 FINDINGS: The heart size and mediastinal contours are within normal limits. Both lungs are clear. The visualized skeletal structures are unremarkable. IMPRESSION: No active disease. Electronically Signed   By: Lucienne Capers M.D.   On: 11/16/2017 01:29   Dg Pelvis 1-2 Views  Result Date: 11/16/2017 CLINICAL DATA:  Left hip pain after fall to the floor. EXAM: PELVIS - 1-2 VIEW COMPARISON:  CT abdomen and pelvis 07/28/2016 FINDINGS: There is no evidence of pelvic fracture or diastasis. No pelvic bone lesions are seen. IMPRESSION: Negative. Electronically Signed   By: Lucienne Capers M.D.   On: 11/16/2017 01:29   Dg Ankle Complete Left  Result Date: 11/16/2017 CLINICAL DATA:  Left ankle pain after a fall. EXAM: LEFT ANKLE COMPLETE - 3+ VIEW COMPARISON:  Left foot 08/02/2017 FINDINGS: No evidence of acute fracture or dislocation of the left ankle. No focal bone  lesion or bone destruction. Bone cortex appears intact. Degenerative changes in the intertarsal joints. Postoperative changes incidentally noted in the foot. Soft tissues are unremarkable. IMPRESSION: No acute bony abnormalities.  Degenerative changes. Electronically Signed   By: Lucienne Capers M.D.   On: 11/16/2017 01:32   Ct Head Wo Contrast  Result Date: 11/16/2017 CLINICAL DATA:  Posttraumatic headache. EXAM: CT HEAD WITHOUT CONTRAST CT CERVICAL SPINE WITHOUT CONTRAST TECHNIQUE: Multidetector CT imaging of the head and cervical spine was performed following the standard protocol without intravenous contrast. Multiplanar CT image reconstructions of the cervical spine were also generated. COMPARISON:  MRI brain 02/04/2014. CT head 05/16/2013. Cervical spine radiographs 11/25/2009 FINDINGS: CT HEAD FINDINGS Brain: No evidence of acute infarction, hemorrhage, hydrocephalus, extra-axial collection or mass lesion/mass effect. Mild cerebral atrophy. Mild ventricular dilatation consistent with central atrophy. Vascular: Mild intracranial arterial vascular calcifications. Skull: Calvarium appears intact. Sinuses/Orbits: Paranasal sinuses and mastoid air cells are clear. Other: None. CT CERVICAL SPINE FINDINGS Alignment: Slight anterior subluxation of C7 on  T1, likely postoperative. This is unchanged since previous study. Normal alignment of the facet joints. C1-2 articulation appears intact. Skull base and vertebrae: Skull base appears intact. No vertebral compression deformities. No focal bone lesion or bone destruction. Soft tissues and spinal canal: No prevertebral soft tissue swelling. No paraspinal soft tissue mass or infiltration. Disc levels: Postoperative changes with anterior plate and screw fixation from C3-C5 and with intervertebral disc fusion from C3 through C7. Fused segments appear intact. Fusion hardware appears intact. Laminectomies at C6 and C7. Degenerative changes at C7-T1 with narrowed disc  space and hypertrophic change. Upper chest: Emphysematous changes in the lung apices. Asymmetric hypoenhancing thyroid nodule on the right measuring up to 10 mm diameter. Based on size, no further evaluation is indicated at this time. Other: None. IMPRESSION: 1. No acute intracranial abnormalities.  Cortical atrophy. 2. Postoperative changes in the cervical spine. Unchanged alignment since previous study. No acute displaced fractures identified. Electronically Signed   By: Lucienne Capers M.D.   On: 11/16/2017 02:03   Ct Cervical Spine Wo Contrast  Result Date: 11/16/2017 CLINICAL DATA:  Posttraumatic headache. EXAM: CT HEAD WITHOUT CONTRAST CT CERVICAL SPINE WITHOUT CONTRAST TECHNIQUE: Multidetector CT imaging of the head and cervical spine was performed following the standard protocol without intravenous contrast. Multiplanar CT image reconstructions of the cervical spine were also generated. COMPARISON:  MRI brain 02/04/2014. CT head 05/16/2013. Cervical spine radiographs 11/25/2009 FINDINGS: CT HEAD FINDINGS Brain: No evidence of acute infarction, hemorrhage, hydrocephalus, extra-axial collection or mass lesion/mass effect. Mild cerebral atrophy. Mild ventricular dilatation consistent with central atrophy. Vascular: Mild intracranial arterial vascular calcifications. Skull: Calvarium appears intact. Sinuses/Orbits: Paranasal sinuses and mastoid air cells are clear. Other: None. CT CERVICAL SPINE FINDINGS Alignment: Slight anterior subluxation of C7 on T1, likely postoperative. This is unchanged since previous study. Normal alignment of the facet joints. C1-2 articulation appears intact. Skull base and vertebrae: Skull base appears intact. No vertebral compression deformities. No focal bone lesion or bone destruction. Soft tissues and spinal canal: No prevertebral soft tissue swelling. No paraspinal soft tissue mass or infiltration. Disc levels: Postoperative changes with anterior plate and screw fixation  from C3-C5 and with intervertebral disc fusion from C3 through C7. Fused segments appear intact. Fusion hardware appears intact. Laminectomies at C6 and C7. Degenerative changes at C7-T1 with narrowed disc space and hypertrophic change. Upper chest: Emphysematous changes in the lung apices. Asymmetric hypoenhancing thyroid nodule on the right measuring up to 10 mm diameter. Based on size, no further evaluation is indicated at this time. Other: None. IMPRESSION: 1. No acute intracranial abnormalities.  Cortical atrophy. 2. Postoperative changes in the cervical spine. Unchanged alignment since previous study. No acute displaced fractures identified. Electronically Signed   By: Lucienne Capers M.D.   On: 11/16/2017 02:03   Dg Humerus Left  Result Date: 11/16/2017 CLINICAL DATA:  Left shoulder pain after a fall. EXAM: LEFT HUMERUS - 2+ VIEW COMPARISON:  None. FINDINGS: There is no evidence of fracture or other focal bone lesions. Soft tissues are unremarkable. IMPRESSION: Negative. Electronically Signed   By: Lucienne Capers M.D.   On: 11/16/2017 01:33   Dg Foot Complete Left  Result Date: 11/16/2017 CLINICAL DATA:  Left foot pain after a fall to floor. EXAM: LEFT FOOT - COMPLETE 3+ VIEW COMPARISON:  08/02/2017 FINDINGS: Postoperative changes with prior left hallux valgus repair and silicon type implants in the second metatarsal phalangeal joint. Old resection or resorption of the distal aspect of the middle phalanx of  the left third toe. No change in appearance since the previous study. No evidence of acute fracture or dislocation. No focal bone lesion or bone destruction. Soft tissues are unremarkable. IMPRESSION: No acute bony abnormalities.  Postoperative changes as described. Electronically Signed   By: Lucienne Capers M.D.   On: 11/16/2017 01:31        Scheduled Meds: . atorvastatin  20 mg Oral Daily  . carvedilol  12.5 mg Oral BID WC  . doxycycline  50 mg Oral BID  . enoxaparin (LOVENOX)  injection  40 mg Subcutaneous Q24H  . fluticasone  2 spray Each Nare Daily  . lamoTRIgine  300 mg Oral QHS  . lisinopril  20 mg Oral BID  . montelukast  10 mg Oral QPM  . polyethylene glycol  17 g Oral Daily  . potassium citrate  20 mEq Oral BID  . sodium chloride  1 g Oral TID WC  . tamsulosin  0.4 mg Oral Daily  . umeclidinium-vilanterol  1 puff Inhalation Daily  . venlafaxine XR  150 mg Oral Q breakfast  . zolpidem  5 mg Oral QHS   Continuous Infusions:   LOS: 0 days    Time spent: 25 mins.More than 50% of that time was spent in counseling and/or coordination of care.      Shelly Coss, MD [facility] Pager [phone]  If 7PM-7AM, please contact night-coverage [email or web address] Password [facility] 11/17/2017, 12:56 PM

## 2017-11-17 NOTE — Progress Notes (Signed)
Physical Therapy Treatment Patient Details Name: Erin Lopez MRN: [id number] DOB: 08-May-1950 Today's Date: 11/17/2017    History of Present Illness Pt is a 67 y.o. F with significant PMH of bipolar disorder, hypertension, COPD, chronic pain who had a fall at home after tripping. Admitted after a fall and finding of severe hyponatremia.    PT Comments    Patient agreeable to hallway ambulation with increased encouragement required. Ambulating 100 feet with no assistive device and min guard assist. Does demonstrate static and dynamic balance deficits. Encouraged to use external support at home with walker, especially outside when patient cannot "counter surf," like she does inside. Patient verbalized understanding.     Follow Up Recommendations  Home health PT;Supervision for mobility/OOB     Equipment Recommendations  None recommended by PT    Recommendations for Other Services       Precautions / Restrictions Precautions Precautions: Fall Restrictions Weight Bearing Restrictions: No    Mobility  Bed Mobility Overal bed mobility: Modified Independent             General bed mobility comments: increased time with HOB elevated  Transfers Overall transfer level: Needs assistance Equipment used: None Transfers: Sit to/from Stand Sit to Stand: Supervision         General transfer comment: supervision transferring from bed and toilet  Ambulation/Gait Ambulation/Gait assistance: Supervision;Min guard Gait Distance (Feet): 100 Feet Assistive device: None Gait Pattern/deviations: Step-through pattern;Decreased stride length;Decreased dorsiflexion - right;Decreased dorsiflexion - left Gait velocity: decr Gait velocity interpretation: <1.31 ft/sec, indicative of household ambulator General Gait Details: Patient continuing to occasionally hold onto furniture/railing/seeking handheld assist for increased steadiness. Slow speed.   Stairs              Wheelchair Mobility    Modified Rankin (Stroke Patients Only)       Balance Overall balance assessment: Needs assistance Sitting-balance support: No upper extremity supported;Feet supported Sitting balance-Leahy Scale: Normal     Standing balance support: No upper extremity supported;During functional activity Standing balance-Leahy Scale: Fair           [place] - Eyes Opened: 10                  Cognition Arousal/Alertness: Awake/alert Behavior During Therapy: WFL for tasks assessed/performed Overall Cognitive Status: No family/caregiver present to determine baseline cognitive functioning                                 General Comments: Patient with somewhat decreased insight into deficits.      Exercises      General Comments        Pertinent Vitals/Pain Pain Assessment: Faces Faces Pain Scale: Hurts little more Pain Location: neck, left shoulder Pain Descriptors / Indicators: Aching;Guarding Pain Intervention(s): Limited activity within patient's tolerance;Monitored during session;Patient requesting pain meds-RN notified    Home Living                      Prior Function            PT Goals (current goals can now be found in the care plan section) Acute Rehab PT Goals Patient Stated Goal: pt agreeable to therapy but states she does not want to discuss discharge planning PT Goal Formulation: With patient Time For Goal Achievement: 11/30/17 Potential to Achieve Goals: Good Progress towards PT goals: Progressing toward goals    Frequency  Min 3X/week      PT Plan Current plan remains appropriate    Co-evaluation              AM-PAC PT "6 Clicks" Daily Activity  Outcome Measure  Difficulty turning over in bed (including adjusting bedclothes, sheets and blankets)?: None Difficulty moving from lying on back to sitting on the side of the bed? : None Difficulty sitting down on and standing up from a  chair with arms (e.g., wheelchair, bedside commode, etc,.)?: A Little Help needed moving to and from a bed to chair (including a wheelchair)?: A Little Help needed walking in hospital room?: A Little Help needed climbing 3-5 steps with a railing? : A Little 6 Click Score: 20    End of Session Equipment Utilized During Treatment: Gait belt Activity Tolerance: Patient tolerated treatment well Patient left: with call bell/phone within reach;in bed Nurse Communication: Mobility status PT Visit Diagnosis: Unsteadiness on feet (R26.81);Difficulty in walking, not elsewhere classified (R26.2)     Time: 8416-6063 PT Time Calculation (min) (ACUTE ONLY): 27 min  Charges:  $Therapeutic Activity: 23-37 mins                    Ellamae Sia, PT, DPT Acute Rehabilitation Services  Pager: [place] 11/17/2017, 4:55 PM

## 2017-11-18 ENCOUNTER — Other Ambulatory Visit: Payer: Self-pay | Admitting: Family Medicine

## 2017-11-18 ENCOUNTER — Encounter (HOSPITAL_COMMUNITY): Payer: Self-pay | Admitting: Emergency Medicine

## 2017-11-18 ENCOUNTER — Other Ambulatory Visit: Payer: Self-pay

## 2017-11-18 ENCOUNTER — Observation Stay (HOSPITAL_BASED_OUTPATIENT_CLINIC_OR_DEPARTMENT_OTHER)
Admission: EM | Admit: 2017-11-18 | Discharge: 2017-11-23 | Disposition: A | Discharge status: Home / Self Care | Payer: Medicare Other | Source: Home / Self Care | Attending: Emergency Medicine | Admitting: Emergency Medicine

## 2017-11-18 DIAGNOSIS — I10 Essential (primary) hypertension: Secondary | ICD-10-CM | POA: Diagnosis present

## 2017-11-18 DIAGNOSIS — J449 Chronic obstructive pulmonary disease, unspecified: Secondary | ICD-10-CM | POA: Diagnosis present

## 2017-11-18 DIAGNOSIS — R9431 Abnormal electrocardiogram [ECG] [EKG]: Secondary | ICD-10-CM | POA: Diagnosis not present

## 2017-11-18 DIAGNOSIS — R410 Disorientation, unspecified: Secondary | ICD-10-CM | POA: Diagnosis not present

## 2017-11-18 DIAGNOSIS — R079 Chest pain, unspecified: Secondary | ICD-10-CM

## 2017-11-18 DIAGNOSIS — F319 Bipolar disorder, unspecified: Secondary | ICD-10-CM | POA: Diagnosis present

## 2017-11-18 DIAGNOSIS — G9340 Encephalopathy, unspecified: Secondary | ICD-10-CM | POA: Diagnosis present

## 2017-11-18 DIAGNOSIS — E871 Hypo-osmolality and hyponatremia: Secondary | ICD-10-CM | POA: Diagnosis not present

## 2017-11-18 LAB — BASIC METABOLIC PANEL
Anion gap: 6 (ref 5–15)
BUN: 7 mg/dL — ABNORMAL LOW (ref 8–23)
CHLORIDE: 98 mmol/L (ref 98–111)
CO2: 29 mmol/L (ref 22–32)
CREATININE: 0.86 mg/dL (ref 0.44–1.00)
Calcium: 9.1 mg/dL (ref 8.9–10.3)
GFR calc Af Amer: >60 mL/min (ref >60)
GFR calc non Af Amer: >60 mL/min (ref >60)
Glucose, Bld: 97 mg/dL (ref 70–99)
Potassium: 4.5 mmol/L (ref 3.5–5.1)
Sodium: 133 mmol/L — ABNORMAL LOW (ref 135–145)

## 2017-11-18 LAB — CBG MONITORING, ED: Glucose-Capillary: 71 mg/dL (ref 70–99)

## 2017-11-18 NOTE — Progress Notes (Signed)
Called Theodis Sato ,patient's daughter ,and leave a voicemail massages ,to inform her that her mother is going home.

## 2017-11-18 NOTE — Discharge Summary (Signed)
Physician Discharge Summary  Erin Lopez NKN:[id number] DOB: [birth year] DOA: 11/16/2017  PCP: Tammi Sou, MD  Admit date: 11/16/2017 Discharge date: 11/18/2017  Admitted From: Home Disposition:  Home  Discharge Condition:Stable CODE STATUS:FULL Diet recommendation: Heart Healthy  Brief/Interim Summary: Patient is a 89 female with history of bipolar disorder, hypertension, COPD, chronic fall syndrome who presented to the emergency department after she fell at home.  Patient was found to be severely hyponatremic on presentation.  She was reportedly drinking lots of fluids as instructed by her urologist for recurrent kidney stones.  Patient was admitted for the management of hyponatremia. Patient was started on fluid restriction and salt tablets.  Her sodium level improved to 133 today.  She is stable for discharge to home.  She will follow-up with her PCP in a week to check her sodium level.  Following problems were addressed during her hospitalization:  Severe hyponatremia: Secondary to increased intake of free water at home.She has H/O kidney stones and follows with urology as an outpatient. 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  We restricted fluid to less than 800 cc a day.  Started on salt tablets.  Sodium level improved to 133 today. I have advised her not to drink too much of free water at home and just drink appropriate amount.  COPD: Currently stable.  Respiratory status stable.  Resume home medications.  Hypertension: On chlorthalidone at home which will be held.  Will continue lisinopril .  Currently blood pressure stable.  Bipolar disorder: On Lamictal and Effexor.    We advised to follow-up with her psychiatrist as an outpatient.  CKD stage II: Currently kidney function stable and is on baseline.  Chronic pain syndrome: Oxycodone  UTI: Was taking doxycycline as an outpatient.  UA done on admission here was non-impressive for UTI.  Antibiotics will be  discontinued.   Discharge Diagnoses:  Principal Problem:   Hyponatremia Active Problems:   Benign essential HTN   COPD (chronic obstructive pulmonary disease) (HCC)   Memory difficulties   Bipolar 1 disorder (HCC)   Stage 2 chronic kidney disease    Discharge Instructions  Discharge Instructions    Diet - low sodium heart healthy   Complete by:  As directed    Discharge instructions   Complete by:  As directed    1) Please follow-up with your PCP in a week.  Do a BMP test to check your sodium level during the follow-up. 2) Follow up with your urologist at [facility] in the next appointment. 3) Donot take too much  free water .Just drink  appropriate amount of water for you on daily basis. 4) Please follow up with your psychiatrist as an outpatient.   Increase activity slowly   Complete by:  As directed      Allergies as of 11/18/2017      Reactions   Ceftin [cefuroxime Axetil] Anaphylaxis   Swelling of eyes and tongue   Ciprofloxacin Anaphylaxis   Difficulty breathing and generalized swelling   Fosamax [alendronate Sodium] Diarrhea   Morphine And Related Other (See Comments)   Hallucinations/ disoriented - pt stated, "Only Morphine" - 03/23/17   Prednisone Other (See Comments)   Hyperactivity   Seroquel [quetiapine] Other (See Comments)   oversedation      Medication List    STOP taking these medications   chlorthalidone 25 MG tablet Commonly known as:  HYGROTON   doxycycline 50 MG capsule Commonly known as:  VIBRAMYCIN  TAKE these medications   albuterol 108 (90 Base) MCG/ACT inhaler Commonly known as:  PROVENTIL HFA;VENTOLIN HFA INHALE 2 PUFFS INTO THE LUNGS EVERY 4 (FOUR) HOURS AS NEEDED FOR WHEEZING OR SHORTNESS OF BREATH. What changed:    how much to take  how to take this  when to take this  reasons to take this  additional instructions   ALPRAZolam 1 MG tablet Commonly known as:  XANAX Take 1 mg by mouth 3 (three) times daily  as needed for anxiety.   ANORO ELLIPTA 62.5-25 MCG/INH Aepb Generic drug:  umeclidinium-vilanterol TAKE 1 PUFF BY MOUTH EVERY DAY What changed:  See the new instructions.   atorvastatin 20 MG tablet Commonly known as:  LIPITOR TAKE 1 TABLET (20 MG TOTAL) BY MOUTH DAILY.   carvedilol 25 MG tablet Commonly known as:  COREG TAKE 1/2 TAB BY MOUTH TWICE A DAY What changed:    how much to take  how to take this  when to take this  additional instructions   cetirizine 10 MG tablet Commonly known as:  ZYRTEC Take 10 mg by mouth every morning.   cyclobenzaprine 10 MG tablet Commonly known as:  FLEXERIL Take 10 mg by mouth 2 (two) times daily as needed for muscle spasms.   fluticasone 50 MCG/ACT nasal spray Commonly known as:  FLONASE Place 2 sprays into both nostrils daily.   lamoTRIgine 100 MG tablet Commonly known as:  LAMICTAL TAKE 3 TABLETS (300 MG TOTAL) BY MOUTH AT BEDTIME.   lisinopril 40 MG tablet Commonly known as:  PRINIVIL,ZESTRIL TAKE 1 TABLET (40 MG TOTAL) BY MOUTH DAILY. What changed:    how much to take  when to take this   montelukast 10 MG tablet Commonly known as:  SINGULAIR TAKE 1 TABLET (10 MG TOTAL) BY MOUTH EVERY EVENING.   Oxycodone HCl 10 MG Tabs Take 1 tablet by mouth every 6 (six) hours as needed (severe pain).   polyethylene glycol powder powder Commonly known as:  GLYCOLAX/MIRALAX TAKE 17 G BY MOUTH DAILY.   potassium citrate 10 MEQ (1080 MG) SR tablet Commonly known as:  UROCIT-K Take 20 mEq by mouth 2 (two) times daily.   ranitidine 150 MG capsule Commonly known as:  ZANTAC Take 150 mg by mouth 2 (two) times daily as needed.   tamsulosin 0.4 MG Caps capsule Commonly known as:  FLOMAX Take 0.4 mg by mouth daily.   valACYclovir 1000 MG tablet Commonly known as:  VALTREX Take 1 tablet (1,000 mg total) by mouth 3 (three) times daily.   venlafaxine XR 150 MG 24 hr capsule Commonly known as:  EFFEXOR-XR TAKE ONE CAPSULE BY  MOUTH EVERY DAY WITH BREAKFAST   zolpidem 10 MG tablet Commonly known as:  AMBIEN Take 10 mg by mouth at bedtime.      Follow-up Information    McGowen, Adrian Blackwater, MD. Schedule an appointment as soon as possible for a visit in 1 week(s).   Specialty:  Family Medicine Contact information: [address] [place] [address] [phone]          Allergies  Allergen Reactions  . Ceftin [Cefuroxime Axetil] Anaphylaxis    Swelling of eyes and tongue  . Ciprofloxacin Anaphylaxis    Difficulty breathing and generalized swelling  . Fosamax [Alendronate Sodium] Diarrhea  . Morphine And Related Other (See Comments)    Hallucinations/ disoriented - pt stated, "Only Morphine" - 03/23/17  . Prednisone Other (See Comments)    Hyperactivity  .  Seroquel [Quetiapine] Other (See Comments)    oversedation    Consultations: None  Procedures/Studies: Dg Chest 1 View  Result Date: 11/16/2017 CLINICAL DATA:  [place] to the floor.  Diffuse pain. EXAM: CHEST  1 VIEW COMPARISON:  04/25/2017 FINDINGS: The heart size and mediastinal contours are within normal limits. Both lungs are clear. The visualized skeletal structures are unremarkable. IMPRESSION: No active disease. Electronically Signed   By: Lucienne Capers M.D.   On: 11/16/2017 01:29   Dg Pelvis 1-2 Views  Result Date: 11/16/2017 CLINICAL DATA:  Left hip pain after fall to the floor. EXAM: PELVIS - 1-2 VIEW COMPARISON:  CT abdomen and pelvis 07/28/2016 FINDINGS: There is no evidence of pelvic fracture or diastasis. No pelvic bone lesions are seen. IMPRESSION: Negative. Electronically Signed   By: Lucienne Capers M.D.   On: 11/16/2017 01:29   Dg Ankle Complete Left  Result Date: 11/16/2017 CLINICAL DATA:  Left ankle pain after a fall. EXAM: LEFT ANKLE COMPLETE - 3+ VIEW COMPARISON:  Left foot 08/02/2017 FINDINGS: No evidence of acute fracture or dislocation of the left ankle. No focal bone lesion or bone destruction. Bone cortex  appears intact. Degenerative changes in the intertarsal joints. Postoperative changes incidentally noted in the foot. Soft tissues are unremarkable. IMPRESSION: No acute bony abnormalities.  Degenerative changes. Electronically Signed   By: Lucienne Capers M.D.   On: 11/16/2017 01:32   Ct Head Wo Contrast  Result Date: 11/16/2017 CLINICAL DATA:  Posttraumatic headache. EXAM: CT HEAD WITHOUT CONTRAST CT CERVICAL SPINE WITHOUT CONTRAST TECHNIQUE: Multidetector CT imaging of the head and cervical spine was performed following the standard protocol without intravenous contrast. Multiplanar CT image reconstructions of the cervical spine were also generated. COMPARISON:  MRI brain 02/04/2014. CT head 05/16/2013. Cervical spine radiographs 11/25/2009 FINDINGS: CT HEAD FINDINGS Brain: No evidence of acute infarction, hemorrhage, hydrocephalus, extra-axial collection or mass lesion/mass effect. Mild cerebral atrophy. Mild ventricular dilatation consistent with central atrophy. Vascular: Mild intracranial arterial vascular calcifications. Skull: Calvarium appears intact. Sinuses/Orbits: Paranasal sinuses and mastoid air cells are clear. Other: None. CT CERVICAL SPINE FINDINGS Alignment: Slight anterior subluxation of C7 on T1, likely postoperative. This is unchanged since previous study. Normal alignment of the facet joints. C1-2 articulation appears intact. Skull base and vertebrae: Skull base appears intact. No vertebral compression deformities. No focal bone lesion or bone destruction. Soft tissues and spinal canal: No prevertebral soft tissue swelling. No paraspinal soft tissue mass or infiltration. Disc levels: Postoperative changes with anterior plate and screw fixation from C3-C5 and with intervertebral disc fusion from C3 through C7. Fused segments appear intact. Fusion hardware appears intact. Laminectomies at C6 and C7. Degenerative changes at C7-T1 with narrowed disc space and hypertrophic change. Upper chest:  Emphysematous changes in the lung apices. Asymmetric hypoenhancing thyroid nodule on the right measuring up to 10 mm diameter. Based on size, no further evaluation is indicated at this time. Other: None. IMPRESSION: 1. No acute intracranial abnormalities.  Cortical atrophy. 2. Postoperative changes in the cervical spine. Unchanged alignment since previous study. No acute displaced fractures identified. Electronically Signed   By: Lucienne Capers M.D.   On: 11/16/2017 02:03   Ct Cervical Spine Wo Contrast  Result Date: 11/16/2017 CLINICAL DATA:  Posttraumatic headache. EXAM: CT HEAD WITHOUT CONTRAST CT CERVICAL SPINE WITHOUT CONTRAST TECHNIQUE: Multidetector CT imaging of the head and cervical spine was performed following the standard protocol without intravenous contrast. Multiplanar CT image reconstructions of the cervical spine were also generated. COMPARISON:  MRI brain 02/04/2014. CT head 05/16/2013. Cervical spine radiographs 11/25/2009 FINDINGS: CT HEAD FINDINGS Brain: No evidence of acute infarction, hemorrhage, hydrocephalus, extra-axial collection or mass lesion/mass effect. Mild cerebral atrophy. Mild ventricular dilatation consistent with central atrophy. Vascular: Mild intracranial arterial vascular calcifications. Skull: Calvarium appears intact. Sinuses/Orbits: Paranasal sinuses and mastoid air cells are clear. Other: None. CT CERVICAL SPINE FINDINGS Alignment: Slight anterior subluxation of C7 on T1, likely postoperative. This is unchanged since previous study. Normal alignment of the facet joints. C1-2 articulation appears intact. Skull base and vertebrae: Skull base appears intact. No vertebral compression deformities. No focal bone lesion or bone destruction. Soft tissues and spinal canal: No prevertebral soft tissue swelling. No paraspinal soft tissue mass or infiltration. Disc levels: Postoperative changes with anterior plate and screw fixation from C3-C5 and with intervertebral disc fusion  from C3 through C7. Fused segments appear intact. Fusion hardware appears intact. Laminectomies at C6 and C7. Degenerative changes at C7-T1 with narrowed disc space and hypertrophic change. Upper chest: Emphysematous changes in the lung apices. Asymmetric hypoenhancing thyroid nodule on the right measuring up to 10 mm diameter. Based on size, no further evaluation is indicated at this time. Other: None. IMPRESSION: 1. No acute intracranial abnormalities.  Cortical atrophy. 2. Postoperative changes in the cervical spine. Unchanged alignment since previous study. No acute displaced fractures identified. Electronically Signed   By: Lucienne Capers M.D.   On: 11/16/2017 02:03   Dg Humerus Left  Result Date: 11/16/2017 CLINICAL DATA:  Left shoulder pain after a fall. EXAM: LEFT HUMERUS - 2+ VIEW COMPARISON:  None. FINDINGS: There is no evidence of fracture or other focal bone lesions. Soft tissues are unremarkable. IMPRESSION: Negative. Electronically Signed   By: Lucienne Capers M.D.   On: 11/16/2017 01:33   Dg Foot Complete Left  Result Date: 11/16/2017 CLINICAL DATA:  Left foot pain after a fall to floor. EXAM: LEFT FOOT - COMPLETE 3+ VIEW COMPARISON:  08/02/2017 FINDINGS: Postoperative changes with prior left hallux valgus repair and silicon type implants in the second metatarsal phalangeal joint. Old resection or resorption of the distal aspect of the middle phalanx of the left third toe. No change in appearance since the previous study. No evidence of acute fracture or dislocation. No focal bone lesion or bone destruction. Soft tissues are unremarkable. IMPRESSION: No acute bony abnormalities.  Postoperative changes as described. Electronically Signed   By: Lucienne Capers M.D.   On: 11/16/2017 01:31       Subjective: Patient seen and examined the bedside this morning.  Remains comfortable.  No new issues/events.  Stable for discharge to home.  Discharge Exam: Vitals:   11/18/17 0501 11/18/17  1059  BP: (!) 147/77 (!) 142/72  Pulse: 87 74  Resp: 18 (!) 26  Temp: 98.2 F (36.8 C) 97.9 F (36.6 C)  SpO2: 100% (!) 89%   Vitals:   11/17/17 1820 11/17/17 2216 11/18/17 0501 11/18/17 1059  BP: 137/62 138/68 (!) 147/77 (!) 142/72  Pulse: 88 79 87 74  Resp: 18 18 18  (!) 26  Temp: 98.1 F (36.7 C) 97.8 F (36.6 C) 98.2 F (36.8 C) 97.9 F (36.6 C)  TempSrc: Oral Oral Oral Oral  SpO2: 100% 100% 100% (!) 89%  Weight:      Height:        General: Pt is alert, awake, not in acute distress Cardiovascular: RRR, S1/S2 +, no rubs, no gallops Respiratory: CTA bilaterally, no wheezing, no rhonchi Abdominal: Soft, NT, ND, bowel sounds +  Extremities: no edema, no cyanosis    The results of significant diagnostics from this hospitalization (including imaging, microbiology, ancillary and laboratory) are listed below for reference.     Microbiology: No results found for this or any previous visit (from the past 240 hour(s)).   Labs: BNP (last 3 results) No results for input(s): BNP in the last 8760 hours. Basic Metabolic Panel: Recent Labs  Lab 11/16/17 0032 11/16/17 0623 11/16/17 1032 11/18/17 0447  NA 120* 124* 125* 133*  K 3.6 3.6 3.6 4.5  CL 81* 89* 88* 98  CO2 29 28 28 29   GLUCOSE 103* 95 85 97  BUN 14 13 13  7*  CREATININE 1.01* 1.00 1.04* 0.86  CALCIUM 9.5 8.8* 9.3 9.1   Liver Function Tests: No results for input(s): AST, ALT, ALKPHOS, BILITOT, PROT, ALBUMIN in the last 168 hours. No results for input(s): LIPASE, AMYLASE in the last 168 hours. No results for input(s): AMMONIA in the last 168 hours. CBC: Recent Labs  Lab 11/16/17 0032 11/16/17 0623  WBC 6.3 5.0  HGB 9.9* 9.2*  HCT 28.2* 25.7*  MCV 86.8 86.5  PLT 204 175   Cardiac Enzymes: Recent Labs  Lab 11/16/17 0032  TROPONINI <0.03   BNP: Invalid input(s): POCBNP CBG: Recent Labs  Lab 11/16/17 0039  GLUCAP 104*   D-Dimer No results for input(s): DDIMER in the last 72 hours. Hgb  A1c No results for input(s): HGBA1C in the last 72 hours. Lipid Profile No results for input(s): CHOL, HDL, LDLCALC, TRIG, CHOLHDL, LDLDIRECT in the last 72 hours. Thyroid function studies Recent Labs    11/16/17 0623  TSH 1.766   Anemia work up No results for input(s): VITAMINB12, FOLATE, FERRITIN, TIBC, IRON, RETICCTPCT in the last 72 hours. Urinalysis    Component Value Date/Time   COLORURINE COLORLESS (A) 11/16/2017 0032   APPEARANCEUR CLEAR 11/16/2017 0032   LABSPEC 1.002 (L) 11/16/2017 0032   PHURINE 8.0 11/16/2017 0032   GLUCOSEU NEGATIVE 11/16/2017 0032   GLUCOSEU NEGATIVE 05/29/2013 1156   HGBUR SMALL (A) 11/16/2017 0032   BILIRUBINUR NEGATIVE 11/16/2017 0032   BILIRUBINUR neg 06/05/2015 1510   KETONESUR NEGATIVE 11/16/2017 0032   PROTEINUR NEGATIVE 11/16/2017 0032   UROBILINOGEN 0.2 06/05/2015 1510   UROBILINOGEN 0.2 08/10/2013 1611   NITRITE NEGATIVE 11/16/2017 0032   LEUKOCYTESUR NEGATIVE 11/16/2017 0032   Sepsis Labs Invalid input(s): PROCALCITONIN,  WBC,  LACTICIDVEN Microbiology No results found for this or any previous visit (from the past 240 hour(s)).  Please note: You were cared for by a hospitalist during your hospital stay. Once you are discharged, your primary care physician will handle any further medical issues. Please note that NO REFILLS for any discharge medications will be authorized once you are discharged, as it is imperative that you return to your primary care physician (or establish a relationship with a primary care physician if you do not have one) for your post hospital discharge needs so that they can reassess your need for medications and monitor your lab values.    Time coordinating discharge: 40 minutes  SIGNED:   Shelly Coss, MD  [facility] 11/18/2017, 11:19 AM Pager [phone]  If 7PM-7AM, please contact night-coverage [email or web address] Password [email or web address]

## 2017-11-18 NOTE — ED Triage Notes (Signed)
Pt presents with family for altered mental status and confusion. Discharged this am fr hyponatremia. Family reports pt was confused at time of discharge. Once home, pt had urinary urgency, and difficulty urinating. Pt alert and oriented to self only.

## 2017-11-18 NOTE — Care Management Note (Signed)
Case Management Note  Patient Details  Name: Erin Lopez MRN: [id number] Date of Birth: 05-Jun-1950  Subjective/Objective:   Patient for dc today with HHPT, NCM spoke with patient , offered choice, she chose [facility] for HHPT, referral given to [facility] with [facility] . Soc will begin 24-48 hrs post dc.                  Action/Plan: DC home when ready.  Expected Discharge Date:  11/18/17               Expected Discharge Plan:  [place]  In-House Referral:     Discharge planning Services  CM Consult  Post Acute Care Choice:  Home Health Choice offered to:  Patient  DME Arranged:    DME Agency:     HH Arranged:  PT [place]:  [place]  Status of Service:  Completed, signed off  If discussed at [place] of Stay Meetings, dates discussed:    Additional Comments:  Zenon Mayo, RN 11/18/2017, 11:47 AM

## 2017-11-18 NOTE — ED Notes (Signed)
Dr. Venora Maples aware of pt, verbal orders for labs/bladder scan.

## 2017-11-19 ENCOUNTER — Observation Stay (HOSPITAL_COMMUNITY): Payer: Medicare Other

## 2017-11-19 ENCOUNTER — Emergency Department (HOSPITAL_COMMUNITY): Payer: Medicare Other

## 2017-11-19 ENCOUNTER — Encounter (HOSPITAL_COMMUNITY): Payer: Self-pay | Admitting: Internal Medicine

## 2017-11-19 DIAGNOSIS — R079 Chest pain, unspecified: Secondary | ICD-10-CM | POA: Diagnosis not present

## 2017-11-19 DIAGNOSIS — G9340 Encephalopathy, unspecified: Secondary | ICD-10-CM | POA: Diagnosis not present

## 2017-11-19 DIAGNOSIS — I10 Essential (primary) hypertension: Secondary | ICD-10-CM

## 2017-11-19 HISTORY — DX: Encephalopathy, unspecified: G93.40

## 2017-11-19 LAB — URINALYSIS, ROUTINE W REFLEX MICROSCOPIC
BACTERIA UA: NONE SEEN
BILIRUBIN URINE: NEGATIVE
Glucose, UA: NEGATIVE mg/dL
Ketones, ur: 20 mg/dL — AB
Leukocytes, UA: NEGATIVE
NITRITE: NEGATIVE
PROTEIN: NEGATIVE mg/dL
SPECIFIC GRAVITY, URINE: 1.006 (ref 1.005–1.030)
pH: 8 (ref 5.0–8.0)

## 2017-11-19 LAB — CBC WITH DIFFERENTIAL/PLATELET
ABS IMMATURE GRANULOCYTES: 0 10*3/uL (ref 0.0–0.1)
BASOS PCT: 0 %
Basophils Absolute: 0 10*3/uL (ref 0.0–0.1)
Eosinophils Absolute: 0 10*3/uL (ref 0.0–0.7)
Eosinophils Relative: 0 %
HCT: 33.6 % — ABNORMAL LOW (ref 36.0–46.0)
Hemoglobin: 11.5 g/dL — ABNORMAL LOW (ref 12.0–15.0)
Immature Granulocytes: 0 %
Lymphocytes Relative: 10 %
Lymphs Abs: 0.9 10*3/uL (ref 0.7–4.0)
MCH: 30.7 pg (ref 26.0–34.0)
MCHC: 34.2 g/dL (ref 30.0–36.0)
MCV: 89.8 fL (ref 78.0–100.0)
Monocytes Absolute: 0.5 10*3/uL (ref 0.1–1.0)
Monocytes Relative: 6 %
NEUTROS ABS: 7.6 10*3/uL (ref 1.7–7.7)
Neutrophils Relative %: 84 %
PLATELETS: 240 10*3/uL (ref 150–400)
RBC: 3.74 MIL/uL — ABNORMAL LOW (ref 3.87–5.11)
RDW: 12.2 % (ref 11.5–15.5)
WBC: 9.1 10*3/uL (ref 4.0–10.5)

## 2017-11-19 LAB — I-STAT VENOUS BLOOD GAS, ED
ACID-BASE EXCESS: 2 mmol/L (ref 0.0–2.0)
Bicarbonate: 26.9 mmol/L (ref 20.0–28.0)
O2 SAT: 27 %
TCO2: 28 mmol/L (ref 22–32)
pCO2, Ven: 42.5 mmHg — ABNORMAL LOW (ref 44.0–60.0)
pH, Ven: 7.41 (ref 7.250–7.430)
pO2, Ven: 18 mmHg — CL (ref 32.0–45.0)

## 2017-11-19 LAB — COMPREHENSIVE METABOLIC PANEL
ALT: 18 U/L (ref 0–44)
AST: 19 U/L (ref 15–41)
Albumin: 4.3 g/dL (ref 3.5–5.0)
Alkaline Phosphatase: 51 U/L (ref 38–126)
Anion gap: 10 (ref 5–15)
BUN: 6 mg/dL — ABNORMAL LOW (ref 8–23)
CHLORIDE: 97 mmol/L — AB (ref 98–111)
CO2: 27 mmol/L (ref 22–32)
CREATININE: 0.82 mg/dL (ref 0.44–1.00)
Calcium: 10.3 mg/dL (ref 8.9–10.3)
GFR calc non Af Amer: >60 mL/min (ref >60)
Glucose, Bld: 121 mg/dL — ABNORMAL HIGH (ref 70–99)
Potassium: 3.8 mmol/L (ref 3.5–5.1)
SODIUM: 134 mmol/L — AB (ref 135–145)
Total Bilirubin: 0.9 mg/dL (ref 0.3–1.2)
Total Protein: 6.8 g/dL (ref 6.5–8.1)

## 2017-11-19 LAB — I-STAT CHEM 8, ED
BUN: 7 mg/dL — ABNORMAL LOW (ref 8–23)
Calcium, Ion: 1.2 mmol/L (ref 1.15–1.40)
Chloride: 95 mmol/L — ABNORMAL LOW (ref 98–111)
Creatinine, Ser: 0.8 mg/dL (ref 0.44–1.00)
GLUCOSE: 118 mg/dL — AB (ref 70–99)
HCT: 32 % — ABNORMAL LOW (ref 36.0–46.0)
HEMOGLOBIN: 10.9 g/dL — AB (ref 12.0–15.0)
POTASSIUM: 3.7 mmol/L (ref 3.5–5.1)
Sodium: 132 mmol/L — ABNORMAL LOW (ref 135–145)
TCO2: 26 mmol/L (ref 22–32)

## 2017-11-19 LAB — ETHANOL

## 2017-11-19 LAB — I-STAT CG4 LACTIC ACID, ED: LACTIC ACID, VENOUS: 1.09 mmol/L (ref 0.5–1.9)

## 2017-11-19 LAB — AMMONIA

## 2017-11-19 MED ORDER — ACETAMINOPHEN 160 MG/5ML PO SOLN: 650.0000 mg | ORAL | Status: DC | PRN

## 2017-11-19 MED ORDER — CARVEDILOL 12.5 MG PO TABS
12.5000 mg | ORAL_TABLET | Freq: Two times a day (BID) | ORAL | Status: DC
  Administered 2017-11-21 – 2017-11-23 (×5): 12.5 mg via ORAL
  Filled 2017-11-19 (×7): qty 1

## 2017-11-19 MED ORDER — ALPRAZOLAM 0.5 MG PO TABS
1.0000 mg | ORAL_TABLET | Freq: Three times a day (TID) | ORAL | Status: DC | PRN
  Administered 2017-11-21: 1 mg via ORAL
  Filled 2017-11-19 (×2): qty 2

## 2017-11-19 MED ORDER — LISINOPRIL 20 MG PO TABS
20.0000 mg | ORAL_TABLET | Freq: Two times a day (BID) | ORAL | Status: DC
  Administered 2017-11-20 – 2017-11-23 (×6): 20 mg via ORAL
  Filled 2017-11-19 (×8): qty 1

## 2017-11-19 MED ORDER — ACETAMINOPHEN 650 MG RE SUPP: 650.0000 mg | RECTAL | Status: DC | PRN

## 2017-11-19 MED ORDER — FLUTICASONE PROPIONATE 50 MCG/ACT NA SUSP
2.0000 | Freq: Every day | NASAL | Status: DC
  Administered 2017-11-21 – 2017-11-23 (×3): 2 via NASAL
  Filled 2017-11-19 (×2): qty 16

## 2017-11-19 MED ORDER — UMECLIDINIUM-VILANTEROL 62.5-25 MCG/INH IN AEPB
1.0000 | INHALATION_SPRAY | Freq: Every day | RESPIRATORY_TRACT | Status: DC
  Administered 2017-11-21 – 2017-11-23 (×3): 1 via RESPIRATORY_TRACT
  Filled 2017-11-19 (×2): qty 14

## 2017-11-19 MED ORDER — ALBUTEROL SULFATE (2.5 MG/3ML) 0.083% IN NEBU
2.5000 mg | INHALATION_SOLUTION | RESPIRATORY_TRACT | Status: DC | PRN
  Administered 2017-11-20 – 2017-11-22 (×2): 2.5 mg via RESPIRATORY_TRACT
  Filled 2017-11-19 (×2): qty 3

## 2017-11-19 MED ORDER — LAMOTRIGINE 100 MG PO TABS
300.0000 mg | ORAL_TABLET | Freq: Every day | ORAL | Status: DC
  Administered 2017-11-19 – 2017-11-20 (×2): 300 mg via ORAL
  Filled 2017-11-19 (×2): qty 3

## 2017-11-19 MED ORDER — FAMOTIDINE 20 MG PO TABS
20.0000 mg | ORAL_TABLET | Freq: Two times a day (BID) | ORAL | Status: DC
  Administered 2017-11-20 – 2017-11-23 (×6): 20 mg via ORAL
  Filled 2017-11-19 (×8): qty 1

## 2017-11-19 MED ORDER — MONTELUKAST SODIUM 10 MG PO TABS
10.0000 mg | ORAL_TABLET | Freq: Every evening | ORAL | Status: DC
  Administered 2017-11-21 – 2017-11-22 (×3): 10 mg via ORAL
  Filled 2017-11-19 (×4): qty 1

## 2017-11-19 MED ORDER — VENLAFAXINE HCL ER 75 MG PO CP24
150.0000 mg | ORAL_CAPSULE | Freq: Every day | ORAL | Status: DC
  Filled 2017-11-19: qty 2

## 2017-11-19 MED ORDER — ATORVASTATIN CALCIUM 10 MG PO TABS
20.0000 mg | ORAL_TABLET | Freq: Every day | ORAL | Status: DC
  Administered 2017-11-21 – 2017-11-23 (×3): 20 mg via ORAL
  Filled 2017-11-19 (×4): qty 2

## 2017-11-19 MED ORDER — ZOLPIDEM TARTRATE 5 MG PO TABS
5.0000 mg | ORAL_TABLET | Freq: Every day | ORAL | Status: DC
  Administered 2017-11-20: 5 mg via ORAL
  Filled 2017-11-19 (×2): qty 1

## 2017-11-19 MED ORDER — TAMSULOSIN HCL 0.4 MG PO CAPS
0.4000 mg | ORAL_CAPSULE | Freq: Every day | ORAL | Status: DC
Start: 2017-11-19 — End: 2017-11-23
  Administered 2017-11-21 – 2017-11-23 (×3): 0.4 mg via ORAL
  Filled 2017-11-19 (×4): qty 1

## 2017-11-19 MED ORDER — CYCLOBENZAPRINE HCL 10 MG PO TABS
10.0000 mg | ORAL_TABLET | Freq: Two times a day (BID) | ORAL | Status: DC | PRN
  Filled 2017-11-19: qty 1

## 2017-11-19 MED ORDER — POTASSIUM CITRATE ER 10 MEQ (1080 MG) PO TBCR
20.0000 meq | EXTENDED_RELEASE_TABLET | Freq: Two times a day (BID) | ORAL | Status: DC
  Administered 2017-11-20 – 2017-11-23 (×6): 20 meq via ORAL
  Filled 2017-11-19 (×9): qty 2

## 2017-11-19 MED ORDER — LORAZEPAM 2 MG/ML IJ SOLN: 0.2500 mg | Freq: Once | INTRAMUSCULAR | Status: DC

## 2017-11-19 MED ORDER — ACETAMINOPHEN 325 MG PO TABS
650.0000 mg | ORAL_TABLET | ORAL | Status: DC | PRN
  Administered 2017-11-21: 650 mg via ORAL
  Filled 2017-11-19 (×2): qty 2

## 2017-11-19 NOTE — ED Notes (Signed)
Dr Tyrone Nine informed of venous blood gas results

## 2017-11-19 NOTE — Progress Notes (Addendum)
Patient is a 67 year old female with past medical history of bipolar disorder, hypertension, COPD was just discharged yesterday after being managed for severe hyponatremia.  At home , patient became very confused and agitated.  Had to be brought to the ER.  Found to be hemodynamically  stable on presentation.  Neurology consulted.  MRI of the brain pending.  Ammonia was normal. Patient seen and examined the bedside this morning.  Looked comfortable but totally confused.  Hemodynamically stable.  We will continue to monitor the patient.  Patient seen by Dr. Hal Hope this morning.

## 2017-11-19 NOTE — ED Notes (Signed)
Pt will not allow cardiac monitor or VS to be cycled.

## 2017-11-19 NOTE — ED Provider Notes (Signed)
[place] EMERGENCY DEPARTMENT Provider Note   CSN: [id number] Arrival date & time: 11/18/17  2249     History   Chief Complaint Chief Complaint  Patient presents with  . Altered Mental Status    HPI Erin Lopez is a 67 y.o. female.  67 yo F with a chief complaint of altered mental status.  The patient was recently in the hospital for hyponatremia, this was thought to have caused her altered mental status that has been going on for the past couple days.  She had a fall as well.  Patient was discharged home this afternoon after her hyponatremia had improved.  Since then the family felt that her mental status is worsened.  They deny any increased fluid intake.  She had been drinking a gallon of water a day for the past month to prevent her from having a kidney stone.  There is some thought that she has been having difficulty urinating since she left.  Level 5 caveat altered mental status.  The history is provided by the patient and the spouse.  Altered Mental Status   This is a new problem. The current episode started more than 2 days ago. The problem has not changed since onset.Associated symptoms include confusion.    Past Medical History:  Diagnosis Date  . Alcoholism in remission [facility])    states last alcohol 11 yrs ago  . Anxiety   . Asymptomatic gallstones   . Bipolar 1 disorder [facility])    sees a psychiatrist  . Cervical spondylosis without myelopathy 12/19/2013   MRI 02/2014: multilevel DDD/spondylosis, not much change compared to prior MRI.  Pt not in favor of invasive therapy for her neck as of 04/2014.  Marland Kitchen Chronic renal insufficiency, stage II (mild)   . COPD (chronic obstructive pulmonary disease) (HCC)    Moderate: anoro started 04/2017 by pulm, pt symptomatically improved and PFTs stable at f/u 06/2017--pulm f/u planned 1 yr.  . Endometritis 05/2017   possible; empiric tx with Flagyl by GYN.  . Environmental allergies   . Fibromyalgia   . GERD  (gastroesophageal reflux disease)   . Herpes zoster 08/07/2017   L scapular region  . Hiatal hernia   . History of adenomatous polyp of colon 04/2008; 08/2014   No high grade dysplasia: recall 5 yrs ([name], [name])  . History of basal cell carcinoma excision    NOSE  . History of Helicobacter pylori infection 04/2008   +gastric biopsy (gastritis but no metaplasia, dysplasia, or malignancy identified)  . History of TIA (transient ischemic attack)    secondary to HELLP syndrome 1994 post c/s--  residual memory loss  . Hyperlipidemia    no meds in > a decade per pt.  Recommended statin 06/21/16.  Marland Kitchen Hypertension   . IFG (impaired fasting glucose) 06/2016   Fasting gluc 105; HbA1c at that time was 6.0%.  . Internal hemorrhoids   . 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 04/2017.  . Memory loss    Abnl MRI brain and CT brain c/w chronic microvascular ischemia.  Repeat MRI brain 02/2014 stable (Dr. Mayra Reel with no plan to f/u with neuro as of 04/2014)  . Osteoporosis 05/2010   2012 'penia; 06/2015 'porosis:  prolia.  repeat DEXA 2 yrs.  As of 12/2016 pt going to discuss w/GYN whether or not  to continue prolia.  DEXA 09/2017 T-score -2.2 femur neck.  . Pelvic floor dysfunction    [facility] ([name])  . Postmenopausal vaginal bleeding 05/2017   x 1 small episode; GYN attempted endo bx but unable to penetrate cervix due to severe cerv stenosis (due to postmenopausal state + hx of LEEP).  Endo u/s showed thin endo lining.  Per GYN---no evidence of endomet pathology on w/u---obs as of 07/2017.  Marland Kitchen Recurrent kidney stones 08/2013   Left ureteral calculus: Perc nephr--cystoscopy w/ureteroscopy + laser for stone removal.  Residual asymptomatic left renal nephrolithiasis <48mm present post-procedure.  Right  sided hydronephrosis-persistent (on u/s)--urol ordered CT to further eval 08/2016: 1.6 cm nonobstructing stone lower pole left kidney, no hydro, plan for PCN extraction ([facility])  . Shortness of breath   . Sprain of neck 12/19/2013  . TMJ (temporomandibular joint disorder)    USES  MOUTH GUARD  . Vitamin D deficiency 05/2011  . Weak urinary stream 07/2016   with elevated PVR and mild left hydronephrosis secondary to this ([facility] started flomax 0.4mg  qd for this).    Patient Active Problem List   Diagnosis Date Noted  . Hyponatremia 11/16/2017  . Bipolar 1 disorder ([place]) 12/29/2016  . Hypertension 12/29/2016  . TIA (transient ischemic attack) 12/29/2016  . Chronic pelvic pain in female 08/22/2016  . Stage 2 chronic kidney disease 08/22/2016  . Osteoporosis 09/30/2015  . Health maintenance examination 06/11/2015  . Suprapubic discomfort 06/05/2015  . Urethral caruncle 06/05/2015  . Infection of urinary tract 06/05/2015  . TMJ (temporomandibular joint disorder) 02/25/2015  . Right ankle injury 11/05/2014  . Cervical spondylosis without myelopathy 12/19/2013  . Sprain of neck 12/19/2013  . Memory difficulties 12/19/2013  . Postnasal drip 10/28/2013  . Renal calculus 08/05/2013  . Urinary frequency 08/01/2013  . Constipation, chronic 07/29/2013  . COPD (chronic obstructive pulmonary disease) ([place]) 06/30/2013  . Abnormal chest x-ray 06/30/2013  . Benign essential HTN 05/29/2013  . Kidney stone on left side 05/29/2013  . Hyperlipidemia 05/29/2013    Past Surgical History:  Procedure Laterality Date  . ANTERIOR CERVICAL DECOMP/DISCECTOMY FUSION  06-30-2009   C3 -- C5  AND EXPLORATION OF FUSION C5-7 W/  PLATE REMOVAL  . CERVICAL FUSION  2002   anterior C5 -- C7  . CERVICAL SPINE SURGERY  08-27-1999     C6 -- T1  LAMINECTOMY/  DISKECTOMY  . CESAREAN SECTION  1994  . COLONOSCOPY W/ POLYPECTOMY  04/2008;08/2014   2016 tubular adenoma x 1; +diverticulosis and int/ext  hemorrhoids.  Recall 5 yrs  . CYSTOSCOPY WITH URETEROSCOPY AND STENT PLACEMENT Left 08/28/2013   Procedure: CYSTOSCOPY, RGP,  WITH URETEROSCOPY AND STENT REMOVAL;  Surgeon: [name], MD;  Location: [facility];  Service: Urology;  Laterality: Left;  . DEXA  09/2017   T-score -2.2 femur neck: improved compared to 06/2015.  Marland Kitchen ESOPHAGOGASTRODUODENOSCOPY  2010; 08/2014   +Candidal esophagitis; Mild chronic gastritis w/intestinal metaplasia--NEG H pylori, neg for eosinophilic esoph  . HOLMIUM LASER APPLICATION Left 06/23/252   Procedure: HOLMIUM LASER APPLICATION;  Surgeon: [name], MD;  Location: [facility];  Service: Urology;  Laterality: Left;  . NEGATIVE SLEEP STUDY  04-01-2007  . NEPHROLITHOTOMY Left 08/05/2013   Procedure: NEPHROLITHOTOMY PERCUTANEOUS;  Surgeon: [name], MD;  Location: [facility];  Service: Urology;  Laterality: Left;  . TONSILLECTOMY AND ADENOIDECTOMY  1975  . TOTAL KNEE ARTHROPLASTY Right 2003  . ULTRASOUND EXAM,PELVIC COMPLETE [facility]  [facility])  06/25/15   NORMAL (done by GYN)     OB History    Gravida  3   Para  1   Term      Preterm      AB      Living  1     SAB      TAB      Ectopic      Multiple      Live Births               Home Medications    Prior to Admission medications   Medication Sig Start Date End Date Taking? Authorizing Provider  albuterol (PROAIR HFA) 108 (90 Base) MCG/ACT inhaler INHALE 2 PUFFS INTO THE LUNGS EVERY 4 (FOUR) HOURS AS NEEDED FOR WHEEZING OR SHORTNESS OF BREATH. Patient taking differently: Inhale 2 puffs into the lungs every 4 (four) hours as needed for wheezing or shortness of breath.  09/17/15   McGowen, Adrian Blackwater, MD  ALPRAZolam Duanne Moron) 1 MG tablet Take 1 mg by mouth 3 (three) times daily as needed for anxiety.     [provider]  ANORO ELLIPTA 62.5-25 MCG/INH AEPB TAKE 1 PUFF BY MOUTH EVERY DAY Patient taking differently: Inhale 1 puff into the lungs daily.   10/12/17   Parrett, Fonnie Mu, NP  atorvastatin (LIPITOR) 20 MG tablet TAKE 1 TABLET (20 MG TOTAL) BY MOUTH DAILY. 07/28/17   McGowen, Adrian Blackwater, MD  carvedilol (COREG) 25 MG tablet TAKE 1/2 TAB BY MOUTH TWICE A DAY Patient taking differently: Take 12.5 mg by mouth 2 (two) times daily with a meal.  07/31/17   McGowen, Adrian Blackwater, MD  cetirizine (ZYRTEC) 10 MG tablet Take 10 mg by mouth every morning.     [provider]  cyclobenzaprine (FLEXERIL) 10 MG tablet Take 10 mg by mouth 2 (two) times daily as needed for muscle spasms.  06/02/15   [provider]  fluticasone (FLONASE) 50 MCG/ACT nasal spray Place 2 sprays into both nostrils daily. 10/21/16   McGowen, Adrian Blackwater, MD  lamoTRIgine (LAMICTAL) 100 MG tablet TAKE 3 TABLETS (300 MG TOTAL) BY MOUTH AT BEDTIME. 09/25/17   McGowen, Adrian Blackwater, MD  lisinopril (PRINIVIL,ZESTRIL) 40 MG tablet TAKE 1 TABLET (40 MG TOTAL) BY MOUTH DAILY. Patient taking differently: Take 20 mg by mouth 2 (two) times daily.  06/06/17 06/06/18  McGowen, Adrian Blackwater, MD  montelukast (SINGULAIR) 10 MG tablet TAKE 1 TABLET (10 MG TOTAL) BY MOUTH EVERY EVENING. 03/15/17   McGowen, Adrian Blackwater, MD  Oxycodone HCl 10 MG TABS Take 1 tablet by mouth every 6 (six) hours as needed (severe pain).  06/10/15   [provider]  polyethylene glycol powder (GLYCOLAX/MIRALAX) powder TAKE 17 G BY MOUTH DAILY. 06/19/17   McGowen, Adrian Blackwater, MD  potassium citrate (UROCIT-K) 10 MEQ (1080 MG) SR tablet Take 20 mEq by mouth 2 (two) times daily. 10/24/17   [provider]  ranitidine (ZANTAC) 150 MG capsule Take 150 mg by mouth 2 (two) times daily as needed.     [provider]  tamsulosin (FLOMAX) 0.4 MG CAPS capsule Take 0.4 mg by mouth daily. 06/29/17   [provider]  valACYclovir (VALTREX) 1000 MG tablet Take 1 tablet (1,000 mg total) by mouth 3 (three) times daily. 08/07/17   Brunetta Jeans, PA-C  venlafaxine XR (EFFEXOR-XR) 150 MG 24 hr capsule TAKE ONE CAPSULE BY  MOUTH EVERY DAY WITH BREAKFAST 10/25/17   McGowen, Adrian Blackwater, MD  zolpidem (AMBIEN) 10 MG tablet Take 10 mg by mouth at bedtime.    [provider]    Family History Family History  Problem Relation Age of Onset  . Alcohol abuse Mother   . Arthritis Mother   . Hypertension Mother   . Hyperlipidemia Mother   . Diabetes Mother   . Parkinsonism Mother   . Breast cancer Mother   . Prostate cancer Father   . Hypertension Father   . Hyperlipidemia Father   . Diabetes Father   . Heart disease Father   . Esophageal cancer Sister   . Colon polyps Sister   . Colon polyps Brother   . Heart disease Brother        x 2  . Irritable bowel syndrome Sister     Social History Social History   Tobacco Use  . Smoking status: Former Smoker    Packs/day: 1.00    Years: 33.00    Pack years: 33.00    Types: Cigarettes    Last attempt to quit: 08/22/1993    Years since quitting: 24.2  . Smokeless tobacco: Never Used  Substance Use Topics  . Alcohol use: No    Alcohol/week: 0.0 standard drinks    Comment: ALCOHOLIC IN REMISSION SINCE  2004  . Drug use: No     Allergies   Ceftin [cefuroxime axetil]; Ciprofloxacin; Fosamax [alendronate sodium]; Morphine and related; Prednisone; and Seroquel [quetiapine]   Review of Systems Review of Systems  Constitutional: Negative for chills and fever.  HENT: Negative for congestion and rhinorrhea.   Eyes: Negative for redness and visual disturbance.  Respiratory: Negative for shortness of breath and wheezing.   Cardiovascular: Negative for chest pain and palpitations.  Gastrointestinal: Negative for nausea and vomiting.  Genitourinary: Negative for dysuria and urgency.  Musculoskeletal: Negative for arthralgias and myalgias.  Skin: Negative for pallor and wound.  Neurological: Negative for dizziness and headaches.  Psychiatric/Behavioral: Positive for confusion.     Physical Exam Updated Vital Signs BP (!) 149/90   Pulse 91   Temp  99 F (37.2 C) (Oral)   Resp 16   SpO2 99%   Physical Exam  Constitutional: She appears well-developed and well-nourished. No distress.  HENT:  Head: Normocephalic and atraumatic.  Eyes: Pupils are equal, round, and reactive to light. EOM are normal.  Neck: Normal range of motion. Neck supple.  Pulmonary/Chest: Effort normal. No respiratory distress.  Abdominal: Soft.  Musculoskeletal: She exhibits no edema or tenderness.  Neurological: She is alert.  Patient is noncompliant with exam but there is no gross weakness.  She is moving all 4 extremities.  She answers questions inappropriately, seems to have trouble with word finding.  Skin: Skin is warm and dry. She is not diaphoretic.  Psychiatric: She has a normal mood and affect. Her behavior is normal. Her speech is tangential. Thought content is paranoid. She is noncommunicative.  Nursing note and vitals reviewed.    ED Treatments / Results  Labs (all labs ordered are listed, but only abnormal results are displayed) Labs Reviewed  CBC WITH DIFFERENTIAL/PLATELET - Abnormal; Notable for the following components:      Result Value   RBC 3.74 (*)    Hemoglobin 11.5 (*)    HCT 33.6 (*)    All other components within normal limits  COMPREHENSIVE METABOLIC PANEL - Abnormal; Notable for the following components:   Sodium 134 (*)    Chloride 97 (*)    Glucose, Bld 121 (*)  BUN 6 (*)    All other components within normal limits  URINALYSIS, ROUTINE W REFLEX MICROSCOPIC - Abnormal; Notable for the following components:   Color, Urine STRAW (*)    Hgb urine dipstick SMALL (*)    Ketones, ur 20 (*)    All other components within normal limits  AMMONIA - Abnormal; Notable for the following components:   Ammonia <9 (*)    All other components within normal limits  I-STAT CHEM 8, ED - Abnormal; Notable for the following components:   Sodium 132 (*)    Chloride 95 (*)    BUN 7 (*)    Glucose, Bld 118 (*)    Hemoglobin 10.9 (*)     HCT 32.0 (*)    All other components within normal limits  I-STAT VENOUS BLOOD GAS, ED - Abnormal; Notable for the following components:   pCO2, Ven 42.5 (*)    pO2, Ven 18.0 (*)    All other components within normal limits  URINE CULTURE  ETHANOL  CBG MONITORING, ED  I-STAT CG4 LACTIC ACID, ED    EKG EKG Interpretation  Date/Time:  Saturday November 18 2017 23:06:15 EDT Ventricular Rate:  94 PR Interval:  188 QRS Duration: 78 QT Interval:  368 QTC Calculation: 460 R Axis:   61 Text Interpretation:  Normal sinus rhythm Nonspecific ST and T wave abnormality Abnormal ECG Since last tracing rate faster Otherwise no significant change Confirmed by Deno Etienne [id number]) on 11/19/2017 12:03:02 AM   Radiology Dg Chest 1 View  Result Date: 11/19/2017 CLINICAL DATA:  Chest pain and confusion. Difficulty urinating. Altered mental status. EXAM: CHEST  1 VIEW COMPARISON:  11/16/2017 FINDINGS: The heart size and mediastinal contours are within normal limits. Both lungs are clear. The visualized skeletal structures are unremarkable. IMPRESSION: No active disease. Electronically Signed   By: Lucienne Capers M.D.   On: 11/19/2017 01:01    Procedures Procedures (including critical care time)  Medications Ordered in ED Medications - No data to display   Initial Impression / Assessment and Plan / ED Course  I have reviewed the triage vital signs and the nursing notes.  Pertinent labs & imaging results that were available during my care of the patient were reviewed by me and considered in my medical decision making (see chart for details).     67 yo F with a chief complaint of altered mental status.  Patient on my exam sometimes will make sense but most the time will speak words that do not seem to correlate with what is going on.  She will not let me examine her.  I will recheck lab work obtain a chest x-ray, urine.   UA is negative for infection, chest x-ray without focal infiltrate is viewed  by me.  Patient's VBG without hypercarbia.  Her sodium is a little bit lower than when she was discharged but not significantly so.  Creatinine is at baseline.  Her ammonia level was negative.  She was retaining urine greater than a liter was in her bladder when she was catheterized.  I discussed the case with Dr. Lorraine Lax, neuro, he felt that an MRI would be warranted with this acute change in her mental status.  I discussed the case with hospitalist for admission.  The patients results and plan were reviewed and discussed.   Any x-rays performed were independently reviewed by myself.   Differential diagnosis were considered with the presenting HPI.  Medications - No data to display  Vitals:  11/18/17 2257 11/19/17 0015  BP: (!) 144/79 (!) 149/90  Pulse: 91 91  Resp: 16   Temp: 99 F (37.2 C)   TempSrc: Oral   SpO2: 98% 99%    Final diagnoses:  Disorientation    Admission/ observation were discussed with the admitting physician, patient and/or family and they are comfortable with the plan.   Final Clinical Impressions(s) / ED Diagnoses   Final diagnoses:  Disorientation    ED Discharge Orders    None       Deno Etienne, DO 11/19/17 [phone]

## 2017-11-19 NOTE — ED Notes (Signed)
Bladder scan revealed 606 ml, attempted to have her use bedpan, and no result.

## 2017-11-19 NOTE — Plan of Care (Signed)
Patient stable, confused at this time, unable to participate in [place].

## 2017-11-19 NOTE — Progress Notes (Signed)
EEG complete - results pending 

## 2017-11-19 NOTE — Procedures (Signed)
Date of recording 11/19/2017  Referring physician Hal Hope  Reason for the study Altered mental status  Technical Digital EEG recording using 10-20 international electrode system  Description of the recording Posterior dominant rhythm is7-8Hz  symmetrical reactive and well sustained Non-REM stage II sleep was not obtained, No localizing, lateralizing, interictal epileptiform features were seen during this recording  Impression The EEG is abnormal and findings are suggestive of mild generalized cerebral dysfunction. Epileptiform features were not seen during this recording.

## 2017-11-19 NOTE — Evaluation (Signed)
Physical Therapy Evaluation Patient Details Name: Erin Lopez MRN: [id number] DOB: 1950/05/16 Today's Date: 11/19/2017   History of Present Illness  Pt is a 67 y.o. F with significant PMH of bipolar disorder, hypertension, COPD, chronic pain who was discharged due to hyponatremia 8/17; and readmitted 8/17 due to worsening confusion.     Clinical Impression  Pt admitted with above symptoms and undergoing testing. Limited assessment due to pt's degree of confusion. Based on her recent acute PT participation, anticipate she will be ambulatory and goals set. Pt currently with functional limitations due to the deficits listed below (see PT Problem List).  Pt will benefit from skilled PT to increase their independence and safety with mobility to allow discharge to the venue listed below.       Follow Up Recommendations SNF;Supervision/Assistance - 24 hour(will re-assess as cognition improves)    Equipment Recommendations  None recommended by PT    Recommendations for Other Services       Precautions / Restrictions Precautions Precautions: Fall      Mobility  Bed Mobility Overal bed mobility: Needs Assistance Bed Mobility: Supine to Sit     Supine to sit: (guided legs over EOB and then pt resisted and pulled back up)        Transfers                 General transfer comment: unable to assess due to decr cooperation  Ambulation/Gait             General Gait Details: unable to assess due to decr cooperation  Stairs            Wheelchair Mobility    Modified Rankin (Stroke Patients Only)       Balance                                             Pertinent Vitals/Pain Pain Assessment: No/denies pain    Home Living Family/patient expects to be discharged to:: Private residence Living Arrangements: Alone Available Help at Discharge: Friend(s);Available PRN/intermittently Type of Home: House Home Access: Stairs to  enter Entrance Stairs-Rails: None Entrance Stairs-Number of Steps: 1 Home Layout: One level Home Equipment: Walker - 2 wheels      Prior Function Level of Independence: Independent               Hand Dominance        Extremity/Trunk Assessment        Lower Extremity Assessment Lower Extremity Assessment: LLE deficits/detail;Generalized weakness LLE Deficits / Details: pt resisted AAROM for hip and knee flexion, no indication of pain       Communication   Communication: No difficulties  Cognition Arousal/Alertness: Awake/alert Behavior During Therapy: Flat affect Overall Cognitive Status: Impaired/Different from baseline Area of Impairment: Orientation;Attention;Following commands;Awareness                 Orientation Level: Place;Person;Time;Situation(could not state last name; ) Current Attention Level: Focused(randomly reading from her nursing board throughout session)   Following Commands: Follows one step commands inconsistently   Awareness: (pre-intellectual)   General Comments: Patient makes poor eye contact and goes back to staring at nursing information board directly at the foot of her bed. Randomly reads portions of board throughout session and tries to make sentences with the words.       General Comments General comments (skin  integrity, edema, etc.): Patient was receiving acute PT prior to d/c on 8/17 and walking 100 ft with RW and min assist    Exercises     Assessment/Plan    PT Assessment Patient needs continued PT services  PT Problem List Decreased strength;Decreased activity tolerance;Decreased balance;Decreased mobility;Decreased cognition;Decreased safety awareness;Decreased knowledge of use of DME       PT Treatment Interventions DME instruction;Gait training;Stair training;Functional mobility training;Therapeutic activities;Therapeutic exercise;Balance training;Patient/family education    PT Goals (Current goals can be found  in the Care Plan section)  Acute Rehab PT Goals Patient Stated Goal: unable to state PT Goal Formulation: Patient unable to participate in goal setting Time For Goal Achievement: 12/03/17 Potential to Achieve Goals: Fair    Frequency Min 3X/week   Barriers to discharge Decreased caregiver support      Co-evaluation               AM-PAC PT "6 Clicks" Daily Activity  Outcome Measure Difficulty turning over in bed (including adjusting bedclothes, sheets and blankets)?: A Lot Difficulty moving from lying on back to sitting on the side of the bed? : Unable Difficulty sitting down on and standing up from a chair with arms (e.g., wheelchair, bedside commode, etc,.)?: Unable Help needed moving to and from a bed to chair (including a wheelchair)?: Total Help needed walking in hospital room?: Total Help needed climbing 3-5 steps with a railing? : Total 6 Click Score: 7    End of Session   Activity Tolerance: Treatment limited secondary to medical complications (Comment)(decr cognition) Patient left: in bed;with call bell/phone within reach;with bed alarm set;with family/visitor present   PT Visit Diagnosis: Muscle weakness (generalized) (M62.81);Difficulty in walking, not elsewhere classified (R26.2)    Time: 8088-1103 PT Time Calculation (min) (ACUTE ONLY): 21 min   Charges:   PT Evaluation $PT Eval Low Complexity: 1 Low            Jeanie Cooks Leandre Wien, PT 11/19/2017, 4:31 PM

## 2017-11-19 NOTE — H&P (Signed)
History and Physical    AMYRE SEGUNDO CHY:[id number] DOB: 07-27-1950 DOA: 11/18/2017  PCP: Tammi Sou, MD  Patient coming from: Home.  Chief Complaint: Confusion.  History obtained from patient's sister at the bedside.  HPI: NAWAAL ALLING is a 67 y.o. female with history of bipolar disorder, hypertension, COPD who was just discharged yesterday after being admitted for severe hyponatremia attributed to patient drinking too much free fluid and being on hydrochlorothiazide was corrected and sodium was around 133 even at discharge taken at home and was found to be very confused and agitated.  Had to be brought to the ER.  Per patient's family patient did not have any fall nausea vomiting diarrhea fever or chills or did not complain of any shortness of breath or chest pain.  ED Course: In the ER patient was afebrile and appeared confused.  On-call neurology was consulted and advised to get MRI brain.  Ammonia was normal.  At the time of my exam patient appears still confused and is trying to resist exam but moving all extremities.  UA chest x-ray unremarkable.  Review of Systems: As per HPI, rest all negative.   Past Medical History:  Diagnosis Date  . Alcoholism in remission [facility])    states last alcohol 11 yrs ago  . Anxiety   . Asymptomatic gallstones   . Bipolar 1 disorder [facility])    sees a psychiatrist  . Cervical spondylosis without myelopathy 12/19/2013   MRI 02/2014: multilevel DDD/spondylosis, not much change compared to prior MRI.  Pt not in favor of invasive therapy for her neck as of 04/2014.  Marland Kitchen Chronic renal insufficiency, stage II (mild)   . COPD (chronic obstructive pulmonary disease) (HCC)    Moderate: anoro started 04/2017 by pulm, pt symptomatically improved and PFTs stable at f/u 06/2017--pulm f/u planned 1 yr.  . Endometritis 05/2017   possible; empiric tx with Flagyl by GYN.  . Environmental allergies   . Fibromyalgia   . GERD (gastroesophageal reflux  disease)   . Herpes zoster 08/07/2017   L scapular region  . Hiatal hernia   . History of adenomatous polyp of colon 04/2008; 08/2014   No high grade dysplasia: recall 5 yrs ([name], [name])  . History of basal cell carcinoma excision    NOSE  . History of Helicobacter pylori infection 04/2008   +gastric biopsy (gastritis but no metaplasia, dysplasia, or malignancy identified)  . History of TIA (transient ischemic attack)    secondary to HELLP syndrome 1994 post c/s--  residual memory loss  . Hyperlipidemia    no meds in > a decade per pt.  Recommended statin 06/21/16.  Marland Kitchen Hypertension   . IFG (impaired fasting glucose) 06/2016   Fasting gluc 105; HbA1c at that time was 6.0%.  . Internal hemorrhoids   . 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 04/2017.  . Memory loss    Abnl MRI brain and CT brain c/w chronic microvascular ischemia.  Repeat MRI brain 02/2014 stable (Dr. Mayra Reel with no plan to f/u with neuro as of 04/2014)  . Osteoporosis 05/2010   2012 'penia; 06/2015 'porosis:  prolia.  repeat DEXA 2 yrs.  As of 12/2016 pt going to discuss w/GYN whether or not to continue prolia.  DEXA 09/2017 T-score -2.2 femur neck.  . Pelvic floor dysfunction  [facility] ([name])  . Postmenopausal vaginal bleeding 05/2017   x 1 small episode; GYN attempted endo bx but unable to penetrate cervix due to severe cerv stenosis (due to postmenopausal state + hx of LEEP).  Endo u/s showed thin endo lining.  Per GYN---no evidence of endomet pathology on w/u---obs as of 07/2017.  Marland Kitchen Recurrent kidney stones 08/2013   Left ureteral calculus: Perc nephr--cystoscopy w/ureteroscopy + laser for stone removal.  Residual asymptomatic left renal nephrolithiasis <93mm present post-procedure.  Right sided  hydronephrosis-persistent (on u/s)--urol ordered CT to further eval 08/2016: 1.6 cm nonobstructing stone lower pole left kidney, no hydro, plan for PCN extraction ([facility])  . Shortness of breath   . Sprain of neck 12/19/2013  . TMJ (temporomandibular joint disorder)    USES  MOUTH GUARD  . Vitamin D deficiency 05/2011  . Weak urinary stream 07/2016   with elevated PVR and mild left hydronephrosis secondary to this ([facility] started flomax 0.4mg  qd for this).    Past Surgical History:  Procedure Laterality Date  . ANTERIOR CERVICAL DECOMP/DISCECTOMY FUSION  06-30-2009   C3 -- C5  AND EXPLORATION OF FUSION C5-7 W/  PLATE REMOVAL  . CERVICAL FUSION  2002   anterior C5 -- C7  . CERVICAL SPINE SURGERY  08-27-1999     C6 -- T1  LAMINECTOMY/  DISKECTOMY  . CESAREAN SECTION  1994  . COLONOSCOPY W/ POLYPECTOMY  04/2008;08/2014   2016 tubular adenoma x 1; +diverticulosis and int/ext hemorrhoids.  Recall 5 yrs  . CYSTOSCOPY WITH URETEROSCOPY AND STENT PLACEMENT Left 08/28/2013   Procedure: CYSTOSCOPY, RGP,  WITH URETEROSCOPY AND STENT REMOVAL;  Surgeon: [name], MD;  Location: [facility];  Service: Urology;  Laterality: Left;  . DEXA  09/2017   T-score -2.2 femur neck: improved compared to 06/2015.  Marland Kitchen ESOPHAGOGASTRODUODENOSCOPY  2010; 08/2014   +Candidal esophagitis; Mild chronic gastritis w/intestinal metaplasia--NEG H pylori, neg for eosinophilic esoph  . HOLMIUM LASER APPLICATION Left 6/81/1572   Procedure: HOLMIUM LASER APPLICATION;  Surgeon: [name], MD;  Location: [facility];  Service: Urology;  Laterality: Left;  . NEGATIVE SLEEP STUDY  04-01-2007  . NEPHROLITHOTOMY Left 08/05/2013   Procedure: NEPHROLITHOTOMY PERCUTANEOUS;  Surgeon: [name], MD;  Location: [facility];  Service: Urology;  Laterality: Left;  . TONSILLECTOMY AND ADENOIDECTOMY  1975  . TOTAL KNEE ARTHROPLASTY Right 2003  . ULTRASOUND EXAM,PELVIC COMPLETE ([place] [facility])  06/25/15    NORMAL (done by GYN)     reports that she quit smoking about 24 years ago. Her smoking use included cigarettes. She has a 33.00 pack-year smoking history. She has never used smokeless tobacco. She reports that she does not drink alcohol or use drugs.  Allergies  Allergen Reactions  . Ceftin [Cefuroxime Axetil] Anaphylaxis    Swelling of eyes and tongue  . Ciprofloxacin Anaphylaxis    Difficulty breathing and generalized swelling  . Fosamax [Alendronate Sodium] Diarrhea  . Morphine And Related Other (See Comments)    Hallucinations/ disoriented - pt stated, "Only Morphine" - 03/23/17  . Prednisone Other (See Comments)    Hyperactivity  . Seroquel [Quetiapine] Other (See Comments)    oversedation    Family History  Problem Relation Age of Onset  . Alcohol abuse Mother   . Arthritis Mother   . Hypertension Mother   . Hyperlipidemia Mother   . Diabetes Mother   . Parkinsonism Mother   . Breast cancer Mother   .  Prostate cancer Father   . Hypertension Father   . Hyperlipidemia Father   . Diabetes Father   . Heart disease Father   . Esophageal cancer Sister   . Colon polyps Sister   . Colon polyps Brother   . Heart disease Brother        x 2  . Irritable bowel syndrome Sister     Prior to Admission medications   Medication Sig Start Date End Date Taking? Authorizing Provider  albuterol (PROAIR HFA) 108 (90 Base) MCG/ACT inhaler INHALE 2 PUFFS INTO THE LUNGS EVERY 4 (FOUR) HOURS AS NEEDED FOR WHEEZING OR SHORTNESS OF BREATH. Patient taking differently: Inhale 2 puffs into the lungs every 4 (four) hours as needed for wheezing or shortness of breath.  09/17/15   McGowen, Adrian Blackwater, MD  ALPRAZolam Duanne Moron) 1 MG tablet Take 1 mg by mouth 3 (three) times daily as needed for anxiety.     [provider]  ANORO ELLIPTA 62.5-25 MCG/INH AEPB TAKE 1 PUFF BY MOUTH EVERY DAY Patient taking differently: Inhale 1 puff into the lungs daily.  10/12/17   Parrett, Fonnie Mu, NP    atorvastatin (LIPITOR) 20 MG tablet TAKE 1 TABLET (20 MG TOTAL) BY MOUTH DAILY. 07/28/17   McGowen, Adrian Blackwater, MD  carvedilol (COREG) 25 MG tablet TAKE 1/2 TAB BY MOUTH TWICE A DAY Patient taking differently: Take 12.5 mg by mouth 2 (two) times daily with a meal.  07/31/17   McGowen, Adrian Blackwater, MD  cetirizine (ZYRTEC) 10 MG tablet Take 10 mg by mouth every morning.     [provider]  cyclobenzaprine (FLEXERIL) 10 MG tablet Take 10 mg by mouth 2 (two) times daily as needed for muscle spasms.  06/02/15   [provider]  fluticasone (FLONASE) 50 MCG/ACT nasal spray Place 2 sprays into both nostrils daily. 10/21/16   McGowen, Adrian Blackwater, MD  lamoTRIgine (LAMICTAL) 100 MG tablet TAKE 3 TABLETS (300 MG TOTAL) BY MOUTH AT BEDTIME. 09/25/17   McGowen, Adrian Blackwater, MD  lisinopril (PRINIVIL,ZESTRIL) 40 MG tablet TAKE 1 TABLET (40 MG TOTAL) BY MOUTH DAILY. Patient taking differently: Take 20 mg by mouth 2 (two) times daily.  06/06/17 06/06/18  McGowen, Adrian Blackwater, MD  montelukast (SINGULAIR) 10 MG tablet TAKE 1 TABLET (10 MG TOTAL) BY MOUTH EVERY EVENING. 03/15/17   McGowen, Adrian Blackwater, MD  Oxycodone HCl 10 MG TABS Take 1 tablet by mouth every 6 (six) hours as needed (severe pain).  06/10/15   [provider]  polyethylene glycol powder (GLYCOLAX/MIRALAX) powder TAKE 17 G BY MOUTH DAILY. 06/19/17   McGowen, Adrian Blackwater, MD  potassium citrate (UROCIT-K) 10 MEQ (1080 MG) SR tablet Take 20 mEq by mouth 2 (two) times daily. 10/24/17   [provider]  ranitidine (ZANTAC) 150 MG capsule Take 150 mg by mouth 2 (two) times daily as needed.     [provider]  tamsulosin (FLOMAX) 0.4 MG CAPS capsule Take 0.4 mg by mouth daily. 06/29/17   [provider]  valACYclovir (VALTREX) 1000 MG tablet Take 1 tablet (1,000 mg total) by mouth 3 (three) times daily. 08/07/17   Brunetta Jeans, PA-C  venlafaxine XR (EFFEXOR-XR) 150 MG 24 hr capsule TAKE ONE CAPSULE BY MOUTH EVERY DAY WITH BREAKFAST  10/25/17   McGowen, Adrian Blackwater, MD  zolpidem (AMBIEN) 10 MG tablet Take 10 mg by mouth at bedtime.    [provider]    Physical Exam: Vitals:   11/18/17 2257 11/19/17 0015  11/19/17 0416  BP: (!) 144/79 (!) 149/90 137/81  Pulse: 91 91 90  Resp: 16  18  Temp: 99 F (37.2 C)  (!) 97.5 F (36.4 C)  TempSrc: Oral  Oral  SpO2: 98% 99% 100%  Weight:   71.7 kg  Height:   5\' 8"  (1.727 m)      Constitutional: Moderately built and nourished. Vitals:   11/18/17 2257 11/19/17 0015 11/19/17 0416  BP: (!) 144/79 (!) 149/90 137/81  Pulse: 91 91 90  Resp: 16  18  Temp: 99 F (37.2 C)  (!) 97.5 F (36.4 C)  TempSrc: Oral  Oral  SpO2: 98% 99% 100%  Weight:   71.7 kg  Height:   5\' 8"  (1.727 m)   Eyes: Anicteric no pallor. ENMT: No discharge from the ears eyes nose or mouth. Neck: No mass felt.  No neck rigidity.  No JVD appreciated. Respiratory: No rhonchi or crepitations. Cardiovascular: S1-S2 heard no murmurs appreciated. Abdomen: Soft nontender bowel sounds present. Musculoskeletal: No edema.  No joint effusion. Skin: No rash.  Skin appears well. Neurologic: Patient is trying to rest his exam.  Moves all extremities on request.  Pupils reactive to light. Psychiatric: Confused.   Labs on Admission: I have personally reviewed following labs and imaging studies  CBC: Recent Labs  Lab 11/16/17 0032 11/16/17 0623 11/19/17 0039 11/19/17 0053  WBC 6.3 5.0 9.1  --   NEUTROABS  --   --  7.6  --   HGB 9.9* 9.2* 11.5* 10.9*  HCT 28.2* 25.7* 33.6* 32.0*  MCV 86.8 86.5 89.8  --   PLT 204 175 240  --    Basic Metabolic Panel: Recent Labs  Lab 11/16/17 0032 11/16/17 0623 11/16/17 1032 11/18/17 0447 11/19/17 0039 11/19/17 0053  NA 120* 124* 125* 133* 134* 132*  K 3.6 3.6 3.6 4.5 3.8 3.7  CL 81* 89* 88* 98 97* 95*  CO2 29 28 28 29 27   --   GLUCOSE 103* 95 85 97 121* 118*  BUN 14 13 13  7* 6* 7*  CREATININE 1.01* 1.00 1.04* 0.86 0.82 0.80  CALCIUM 9.5 8.8* 9.3 9.1  10.3  --    GFR: Estimated Creatinine Clearance: 71.7 mL/min (by C-G formula based on SCr of 0.8 mg/dL). Liver Function Tests: Recent Labs  Lab 11/19/17 0039  AST 19  ALT 18  ALKPHOS 51  BILITOT 0.9  PROT 6.8  ALBUMIN 4.3   No results for input(s): LIPASE, AMYLASE in the last 168 hours. Recent Labs  Lab 11/19/17 0039  AMMONIA <9*   Coagulation Profile: No results for input(s): INR, PROTIME in the last 168 hours. Cardiac Enzymes: Recent Labs  Lab 11/16/17 0032  TROPONINI <0.03   BNP (last 3 results) No results for input(s): PROBNP in the last 8760 hours. HbA1C: No results for input(s): HGBA1C in the last 72 hours. CBG: Recent Labs  Lab 11/16/17 0039 11/18/17 2306  GLUCAP 104* 71   Lipid Profile: No results for input(s): CHOL, HDL, LDLCALC, TRIG, CHOLHDL, LDLDIRECT in the last 72 hours. Thyroid Function Tests: Recent Labs    11/16/17 0623  TSH 1.766   Anemia Panel: No results for input(s): VITAMINB12, FOLATE, FERRITIN, TIBC, IRON, RETICCTPCT in the last 72 hours. Urine analysis:    Component Value Date/Time   COLORURINE STRAW (A) 11/19/2017 0039   APPEARANCEUR CLEAR 11/19/2017 0039   LABSPEC 1.006 11/19/2017 0039   PHURINE 8.0 11/19/2017 0039   GLUCOSEU NEGATIVE 11/19/2017 0039   GLUCOSEU NEGATIVE 05/29/2013  [place] (A) 11/19/2017 0039   BILIRUBINUR NEGATIVE 11/19/2017 0039   BILIRUBINUR neg 06/05/2015 1510   KETONESUR 20 (A) 11/19/2017 0039   PROTEINUR NEGATIVE 11/19/2017 0039   UROBILINOGEN 0.2 06/05/2015 1510   UROBILINOGEN 0.2 08/10/2013 1611   NITRITE NEGATIVE 11/19/2017 0039   LEUKOCYTESUR NEGATIVE 11/19/2017 0039   Sepsis Labs: @LABRCNTIP (procalcitonin:4,lacticidven:4) )No results found for this or any previous visit (from the past 240 hour(s)).   Radiological Exams on Admission: Dg Chest 1 View  Result Date: 11/19/2017 CLINICAL DATA:  Chest pain and confusion. Difficulty urinating. Altered mental status. EXAM: CHEST  1 VIEW  COMPARISON:  11/16/2017 FINDINGS: The heart size and mediastinal contours are within normal limits. Both lungs are clear. The visualized skeletal structures are unremarkable. IMPRESSION: No active disease. Electronically Signed   By: Lucienne Capers M.D.   On: 11/19/2017 01:01    EKG: Independently reviewed.  Normal sinus rhythm.  Assessment/Plan Principal Problem:   Acute encephalopathy Active Problems:   COPD (chronic obstructive pulmonary disease) (HCC)   Bipolar 1 disorder (HCC)   Hypertension    1. Acute encephalopathy -cause not clear patient is afebrile at this time ammonia levels are normal.  MRI brain pending.  Following which we will have further plans.  Check EEG.  But there is no signs of any active seizures. 2. COPD not actively wheezing.  VBG does not show any carbon dioxide retention. 3. Hypertension on beta-blocker and lisinopril. 4. Bipolar disorder on Lamictal and Effexor.  PRN Xanax for anxiety. 5. Recent admission for severe hyponatremia.   DVT prophylaxis: Lovenox. Code Status: Full code. Family Communication: Family at the bedside. Disposition Plan: Home. Consults called: ER physician discussed with neurologist. Admission status: Observation.   Rise Patience MD [facility] Pager [phone].  If 7PM-7AM, please contact night-coverage [email or web address] Password [facility]  11/19/2017, 4:54 AM

## 2017-11-19 NOTE — Progress Notes (Signed)
Pt admitted to unit; moved from stretcher to bed. Pt no c/o pain. Will continue to monitor.

## 2017-11-20 ENCOUNTER — Observation Stay (HOSPITAL_COMMUNITY): Payer: Medicare Other

## 2017-11-20 ENCOUNTER — Telehealth: Payer: Self-pay

## 2017-11-20 DIAGNOSIS — G9340 Encephalopathy, unspecified: Secondary | ICD-10-CM | POA: Diagnosis not present

## 2017-11-20 DIAGNOSIS — F319 Bipolar disorder, unspecified: Secondary | ICD-10-CM

## 2017-11-20 DIAGNOSIS — R443 Hallucinations, unspecified: Secondary | ICD-10-CM

## 2017-11-20 LAB — CBC WITH DIFFERENTIAL/PLATELET
ABS IMMATURE GRANULOCYTES: 0 10*3/uL (ref 0.0–0.1)
Basophils Absolute: 0 10*3/uL (ref 0.0–0.1)
Basophils Relative: 0 %
Eosinophils Absolute: 0 10*3/uL (ref 0.0–0.7)
Eosinophils Relative: 0 %
HEMATOCRIT: 33.7 % — AB (ref 36.0–46.0)
Hemoglobin: 11.6 g/dL — ABNORMAL LOW (ref 12.0–15.0)
Immature Granulocytes: 0 %
LYMPHS ABS: 1.1 10*3/uL (ref 0.7–4.0)
Lymphocytes Relative: 13 %
MCH: 30.4 pg (ref 26.0–34.0)
MCHC: 34.4 g/dL (ref 30.0–36.0)
MCV: 88.2 fL (ref 78.0–100.0)
MONO ABS: 0.3 10*3/uL (ref 0.1–1.0)
MONOS PCT: 4 %
NEUTROS ABS: 6.8 10*3/uL (ref 1.7–7.7)
Neutrophils Relative %: 83 %
PLATELETS: 325 10*3/uL (ref 150–400)
RBC: 3.82 MIL/uL — ABNORMAL LOW (ref 3.87–5.11)
RDW: 12.5 % (ref 11.5–15.5)
WBC: 8.2 10*3/uL (ref 4.0–10.5)

## 2017-11-20 LAB — BASIC METABOLIC PANEL
Anion gap: 11 (ref 5–15)
BUN: 15 mg/dL (ref 8–23)
CHLORIDE: 100 mmol/L (ref 98–111)
CO2: 25 mmol/L (ref 22–32)
Calcium: 9.7 mg/dL (ref 8.9–10.3)
Creatinine, Ser: 0.85 mg/dL (ref 0.44–1.00)
GFR calc Af Amer: >60 mL/min (ref >60)
GLUCOSE: 164 mg/dL — AB (ref 70–99)
POTASSIUM: 3.3 mmol/L — AB (ref 3.5–5.1)
Sodium: 136 mmol/L (ref 135–145)

## 2017-11-20 LAB — URINE CULTURE: Culture: NO GROWTH

## 2017-11-20 MED ORDER — LORAZEPAM 2 MG/ML IJ SOLN
1.0000 mg | Freq: Once | INTRAMUSCULAR | Status: AC
  Administered 2017-11-20: 1 mg via INTRAVENOUS
  Filled 2017-11-20: qty 1

## 2017-11-20 NOTE — Progress Notes (Signed)
Pt talking more and clearly. Pt talking about unrelated topics when asked a question, for example, when RN asked pt, "Can I check your oxygen (the pulse ox monitor)", pt nodded yes and replied, "'They're coming" and motioned with her finger as if pointing to an imagined object, and continued making sound effects with her mouth as if a space ship was landing. Also, she said, "Psych ward, psych ward" and also made a bizarre facial expression like she was laughing. Pt's visitor is close friend and said she never acted this way before. Pt only took scheduled lamictal po in the evening and refused all other po meds. VS are stable this am.  Will continue to monitor.

## 2017-11-20 NOTE — Progress Notes (Signed)
Occupational Therapy Treatment Patient Details Name: Erin Lopez MRN: [id number] DOB: 24-Apr-1950 Today's Date: 11/20/2017    History of present illness Pt is a 67 y.o. F with significant PMH of bipolar disorder, hypertension, COPD, chronic pain who was discharged due to hyponatremia 8/17; and readmitted 8/17 due to worsening confusion.    OT comments  Pt remains with cognitive deficits and needing (A) for basic adls. Pt noted to have stool in vagina area during peri care. Pt needs (A) for steady (A) for balance. Pt with recall of information at times during session but then making comments like "that computer over there took over this whole house and it went through years of data. Just wild." Statements like this are not related to the session / topic being asked and demonstrate the cognitive deficits currently affecting the patient. Pt is not safe to d/c home alone or care for a pet such as peanut at this time.    Follow Up Recommendations  SNF    Equipment Recommendations  3 in 1 bedside commode    Recommendations for Other Services      Precautions / Restrictions Precautions Precautions: Fall       Mobility Bed Mobility Overal bed mobility: Needs Assistance Bed Mobility: Supine to Sit     Supine to sit: Supervision        Transfers Overall transfer level: Needs assistance   Transfers: Sit to/from Stand Sit to Stand: Supervision              Balance Overall balance assessment: Needs assistance   Sitting balance-Leahy Scale: Good       Standing balance-Leahy Scale: Fair Standing balance comment: pt with posterior lean at times and several reports of dizziness                           ADL either performed or assessed with clinical judgement   ADL Overall ADL's : Needs assistance/impaired   Eating/Feeding Details (indicate cue type and reason): drinking from cup on arrival mod I Grooming: Wash/dry face;Wash/dry hands;Oral care;Minimal  assistance;Standing Grooming Details (indicate cue type and reason): pt needs redirection due to impulsive and attention to detail. pt fixated at times and terminating prematurely at other times     Lower Body Bathing: Moderate assistance Lower Body Bathing Details (indicate cue type and reason): pt noted to have stool in vagina area. pt with almost no stool noted on buttock. RN made aware. pt with stool drainage on the pad in the bed.  Upper Body Dressing : Minimal assistance;Sitting Upper Body Dressing Details (indicate cue type and reason): don new gown     Toilet Transfer: Supervision/safety;RW Toilet Transfer Details (indicate cue type and reason): pt with posterior lean initially. pt reports dizziness with initial transfer         Functional mobility during ADLs: Minimal assistance;Rolling walker General ADL Comments: pt needed redirection on arrival. pt fixated on dog "peanut" pt needed OT to give direct step by step instructions. pt with good recall of therapist from prevoius session but reports it occurred in the PM hours. pt reports seeing double but then correctly reports single vision.      Vision       Perception     Praxis      Cognition Arousal/Alertness: Awake/alert Behavior During Therapy: Flat affect;Impulsive;Restless Overall Cognitive Status: Impaired/Different from baseline  General Comments: pt impulsive and fixated on adls and repeating them. pt insist on keeping tooth brush to keep brushing after a normal amount of time has been completed >5 minutes. Pt needs redirection to task throughout session. pt very upset to discover poor self hyigene adn self reports "i could use a xanax now. that is gross! that is just disgusting)        Exercises     Shoulder Instructions       General Comments      Pertinent Vitals/ Pain       Pain Assessment: No/denies pain  Home Living                                           Prior Functioning/Environment              Frequency  Min 2X/week        Progress Toward Goals  OT Goals(current goals can now be found in the care plan section)  Progress towards OT goals: Progressing toward goals  Acute Rehab OT Goals Patient Stated Goal: to get dog peanut OT Goal Formulation: Patient unable to participate in goal setting Potential to Achieve Goals: Good ADL Goals Pt Will Perform Eating: with min assist;sitting Pt Will Perform Grooming: with min assist;sitting Pt Will Transfer to Toilet: with min assist;ambulating Additional ADL Goal #1: Pt will complete bed mobility supervision as precursor to adls Additional ADL Goal #2: Pt will follow 2 step command for adl with min cues  Plan Discharge plan remains appropriate    Co-evaluation                 AM-PAC PT "6 Clicks" Daily Activity     Outcome Measure   Help from another person eating meals?: A Little Help from another person taking care of personal grooming?: A Little Help from another person toileting, which includes using toliet, bedpan, or urinal?: A Little Help from another person bathing (including washing, rinsing, drying)?: A Little Help from another person to put on and taking off regular upper body clothing?: A Little Help from another person to put on and taking off regular lower body clothing?: A Little 6 Click Score: 18    End of Session Equipment Utilized During Treatment: Rolling walker  OT Visit Diagnosis: Unsteadiness on feet (R26.81);Repeated falls (R29.6);Muscle weakness (generalized) (M62.81)   Activity Tolerance Patient tolerated treatment well   Patient Left in chair;with call bell/phone within reach;with chair alarm set   Nurse Communication Mobility status;Precautions        Time: 4010-2725 OT Time Calculation (min): 23 min  Charges: OT General Charges $OT Visit: 1 Visit OT Treatments $Self Care/Home Management : 23-37  mins   Jeri Modena   OTR/L Pager: [phone] Office: [phone] .    Erin Lopez B 11/20/2017, 2:54 PM

## 2017-11-20 NOTE — Progress Notes (Signed)
Sent for patient at 0600.  RN, Lyndee Leo called at [phone] Pt is to agitated to come.  Try later on if patient is able.

## 2017-11-20 NOTE — Progress Notes (Signed)
Interim note  -Year-old female with past medical history of bipolar disorder, hyponatremia who presented to the ER with confusion and agitation.  This case was discussed with Hal Hope by me on 8-18 -19 who felt this was likely metabolic encephalopathy and did not request formal neurology consult. I had recommended MRI Aaron Edelman -apparently patient was too agitated to get MRI brain.

## 2017-11-20 NOTE — Care Management Obs Status (Signed)
[place] NOTIFICATION   Patient Details  Name: Erin Lopez MRN: [id number] Date of Birth: 01-08-1951   Medicare Observation Status Notification Given:  Yes    Pollie Friar, RN 11/20/2017, 3:39 PM

## 2017-11-20 NOTE — Plan of Care (Signed)
Patient stable, more alert & oriented w/wandering, discussed POC with patient, agreeable with plan, denies question/concerns at this time.

## 2017-11-20 NOTE — Telephone Encounter (Signed)
Patient admitted to hospital.

## 2017-11-20 NOTE — Progress Notes (Signed)
Occupational Therapy Evaluation Patient Details Name: Erin Lopez MRN: [id number] DOB: 22-May-1950 Today's Date: 11/20/2017    History of Present Illness Pt is a 67 y.o. F with significant PMH of bipolar disorder, hypertension, COPD, chronic pain who was discharged due to hyponatremia 8/17; and readmitted 8/17 due to worsening confusion.    Clinical Impression   PT admitted with AMS. Pt currently with functional limitiations due to the deficits listed below (see OT problem list). Pt currently requires mod (A) for grooming task and easily internally distracted. Pt repeating the same phrases throughout session however will increase intensity and volume of voice with changes. Pt becoming louder and faster in repeating to indicate nausea. When directly asked the patient responds that she is nauseated and spinning. Session greatly limited by participation and pt shows dislike for continuing session with verbalizations and holding up middle finger on bil UE. Pt states "make them stop Erin Lopez" to her friend at bedside. Friend reporting the patient has not eaten in 3 days.  Pt will benefit from skilled OT to increase their independence and safety with adls and balance to allow discharge SNF.   Pt responds to music but would not progress to sitting. Question if spinning could be related to falls no nystagmus noted but patient did become very resistive to side lying when attempting EOB sitting.      Follow Up Recommendations  SNF    Equipment Recommendations  3 in 1 bedside commode    Recommendations for Other Services       Precautions / Restrictions Precautions Precautions: Fall Precaution Comments: question diplopia ?       Mobility Bed Mobility Overal bed mobility: Needs Assistance             General bed mobility comments: total (A) +2 to pull to [facility]. pt does not tolerate log rooling. pt reports extereme nausea  Transfers                 General transfer comment:  unable to assess    Balance                                           ADL either performed or assessed with clinical judgement   ADL Overall ADL's : Needs assistance/impaired   Eating/Feeding Details (indicate cue type and reason): friend reports no NPO intake for 3 days. pt refusing food this morning and making references to nausea and the "smell" Grooming: Wash/dry face;Wash/dry hands;Moderate assistance;Supervision/safety   Upper Body Bathing: Maximal assistance   Lower Body Bathing: Maximal assistance   Upper Body Dressing : Maximal assistance   Lower Body Dressing: Maximal assistance                 General ADL Comments: Pt allowed incr time and multiple approaches to progress to EOB. pt refusing. Pt making song references. pt states words from a 106 s song group so OT attempting to use music. Initially the patient began to move and then stopped and only tapping BIL UE in same tapping to the beat. pt able to move bil ue at the same rate and speed. Pt tapping toes. Pt declined OOB despite HOB increase gradually, friend in the room attempting to (A). physcial placement bil LE off EOB> pt pulling bil LE back on the bed and telling staff to back off. pt closing eyes and holding up middle  finger to both hands showing single digit isolation bil UE.      Vision Baseline Vision/History: Wears glasses Wears Glasses: At all times Additional Comments: questions diplopia due to patient self closing L eye to read. Pt tolerated therapist occluding L eye during perseration of white board. pt states "you messing with me arent you!" pt seems alarmed and chatting changes to show patient did have a change.      Perception     Praxis      Pertinent Vitals/Pain Pain Assessment: Faces Faces Pain Scale: Hurts a little bit Pain Location: neck discomfort ( chronic)- biggest compliant nausea Pain Intervention(s): Monitored during session;Repositioned;Limited activity within  patient's tolerance     Hand Dominance Right   Extremity/Trunk Assessment Upper Extremity Assessment Upper Extremity Assessment: Difficult to assess due to impaired cognition(observed bil UE FF shoulder to 90 degrees )   Lower Extremity Assessment Lower Extremity Assessment: Defer to PT evaluation LLE Deficits / Details: noted to have bruise on 2nd toe   Cervical / Trunk Assessment Cervical / Trunk Assessment: Other exceptions Cervical / Trunk Exceptions: friend reports previous surg and chronic pain   Communication Communication Communication: No difficulties   Cognition Arousal/Alertness: Awake/alert Behavior During Therapy: Flat affect;Impulsive;Restless Overall Cognitive Status: Impaired/Different from baseline Area of Impairment: Orientation;Attention;Following commands;Memory                 Orientation Level: Disoriented to;Person;Place;Time;Situation Current Attention Level: Focused Memory: Decreased recall of precautions;Decreased short-term memory Following Commands: Follows one step commands inconsistently   Awareness: Intellectual   General Comments: Pt keeps saying "behind you" "here comes a bump" "2 people assist me" - reading the white erase board at the end of the bed and using these terms as reasons to refuse treatment. Pt noted to occlude L eye with reading.    General Comments       Exercises     Shoulder Instructions      Home Living Family/patient expects to be discharged to:: Private residence Living Arrangements: Alone Available Help at Discharge: Friend(s);Available PRN/intermittently Type of Home: House Home Access: Stairs to enter [employer] of Steps: 1 Entrance Stairs-Rails: None Home Layout: One level               Home Equipment: Walker - 2 wheels   Additional Comments: best friend of 40 years "Erin Lopez" at bedside. pt calls her "Sis" Has a dog named "peanut"      Prior Functioning/Environment Level of  Independence: Independent        Comments: noted bruising on L LE and 2nd toe very bruised - friend states multiple falls        OT Problem List: Decreased strength;Decreased range of motion;Decreased activity tolerance;Impaired balance (sitting and/or standing);Impaired vision/perception;Decreased coordination;Decreased cognition;Decreased safety awareness;Decreased knowledge of use of DME or AE;Decreased knowledge of precautions;Pain      OT Treatment/Interventions: Self-care/ADL training;Therapeutic exercise;Neuromuscular education;Energy conservation;DME and/or AE instruction;Therapeutic activities;Patient/family education;Visual/perceptual remediation/compensation;Cognitive remediation/compensation;Balance training    OT Goals(Current goals can be found in the care plan section) Acute Rehab OT Goals Patient Stated Goal: unable to state OT Goal Formulation: Patient unable to participate in goal setting Potential to Achieve Goals: Good  OT Frequency: Min 2X/week   Barriers to D/C: Decreased caregiver support          Co-evaluation              AM-PAC PT "6 Clicks" Daily Activity     Outcome Measure Help from another person eating meals?: A  Lot Help from another person taking care of personal grooming?: A Lot Help from another person toileting, which includes using toliet, bedpan, or urinal?: A Lot Help from another person bathing (including washing, rinsing, drying)?: A Lot Help from another person to put on and taking off regular upper body clothing?: A Lot Help from another person to put on and taking off regular lower body clothing?: A Lot 6 Click Score: 12   End of Session Nurse Communication: Mobility status;Precautions  Activity Tolerance: Other (comment)(limited by cognitive status and dizziness/ nausea) Patient left: in bed;with call bell/phone within reach;with bed alarm set;with family/visitor present  OT Visit Diagnosis: Unsteadiness on feet  (R26.81);Repeated falls (R29.6);Muscle weakness (generalized) (M62.81)                Time: 1165-7903 OT Time Calculation (min): 45 min Charges:  OT General Charges $OT Visit: 1 Visit OT Evaluation $OT Eval Moderate Complexity: 1 Mod OT Treatments $Self Care/Home Management : 23-37 mins   Jeri Modena   OTR/L Pager: [phone] Office: [phone] .   Parke Poisson B 11/20/2017, 10:21 AM

## 2017-11-20 NOTE — Progress Notes (Addendum)
PROGRESS NOTE    Erin Lopez  DGL:[id number] DOB: 1950-07-24 DOA: 11/18/2017 PCP: Tammi Sou, MD   Brief Narrative:  Patient is a 67 year old female with past medical history of bipolar disorder, hypertension, COPD was just discharged on 11/18/17 after being managed for severe hyponatremia.  At home , patient became very confused and agitated.  Had to be brought to the ER.  Found to be hemodynamically  stable on presentation.  Neurology consulted.  MRI done today did not show any acute intracranial abnormalities.  Psychiatry consulted.   Assessment & Plan:   Principal Problem:   Acute encephalopathy Active Problems:   COPD (chronic obstructive pulmonary disease) (HCC)   Bipolar 1 disorder (HCC)   Hypertension  Altered mental status: Remains confused.  Most likely her altered mental status is secondary to her psychiatric issues.  MRI of the brain did not show acute intracranial abnormalities.  Neurological exam is unremarkable.  She does not have any evidence of metabolic encephalopathy.  Lab works non impressive.  We have requested for psychiatric consultation.  Severe hyponatremia:Resolved.Presented with severe hyponatremia on last admission secondary to increased intake of free water at home.She has H/O kidney stones and follows with urology as an outpatient.There could be component of SIADH also.  We have advised her not to drink too much of free water at home and just drink appropriate amount.  COPD: Currently stable.  Respiratory status stable.  Resume home medications.  Hypertension: On chlorthalidone at home which will be held. Will continue lisinopril .Currently blood pressure stable.  Bipolar disorder: On Lamictal and Effexor.   Requested for psychiatric consultation.  CKD stage JJ:OACZYSAYT kidney function stable and is on baseline.  Chronic pain syndrome: Oxycodone  UTI: Was taking doxycycline as an outpatient.  UA done on admission here was  non-impressive for UTI.  Antibiotics will be discontinued.  Hypokalemia: Supplemented with potassium.  Deconditioning/debility: PT evaluated and recommended skilled nursing facility on discharge.  Social worker consulted.  DVT prophylaxis: SCD Code Status: Full Family Communication: Friend present at the bedside  disposition Plan: Skilled nursing facility after psychiatric evaluation.   Consultants: Neurology, psychiatry  Procedures: None  Antimicrobials: None  Subjective: Patient seen and examined at bedside this morning.  Remains incoherent and confused.  Hemodynamically stable.  Objective: Vitals:   11/20/17 0449 11/20/17 0650 11/20/17 0850 11/20/17 1231  BP: (!) 142/83 122/76 91/77 127/75  Pulse:    (!) 109  Resp: 17 (!) 22 19 20   Temp:   97.9 F (36.6 C) 98.1 F (36.7 C)  TempSrc:   Axillary Oral  SpO2:  97% 98% 95%  Weight:      Height:        Intake/Output Summary (Last 24 hours) at 11/20/2017 1245 Last data filed at 11/20/2017 0043 Gross per 24 hour  Intake -  Output 1000 ml  Net -1000 ml   Filed Weights   11/19/17 0416  Weight: 71.7 kg    Examination:  General exam: Appears calm and comfortable ,Not in distress,average built HEENT:PERRL,Oral mucosa moist, Ear/Nose normal on gross exam Respiratory system: Bilateral equal air entry, normal vesicular breath sounds, no wheezes or crackles  Cardiovascular system: S1 & S2 heard, RRR. No JVD, murmurs, rubs, gallops or clicks. No pedal edema. Gastrointestinal system: Abdomen is nondistended, soft and nontender. No organomegaly or masses felt. Normal bowel sounds heard. Central nervous system: Not alert and oriented. No focal neurological deficits. Extremities: No edema, no clubbing ,no cyanosis, distal peripheral pulses palpable. Skin:  No rashes, lesions or ulcers,no icterus ,no pallor   Data Reviewed: I have personally reviewed following labs and imaging studies  CBC: Recent Labs  Lab 11/16/17 0032  11/16/17 0623 11/19/17 0039 11/19/17 0053  WBC 6.3 5.0 9.1  --   NEUTROABS  --   --  7.6  --   HGB 9.9* 9.2* 11.5* 10.9*  HCT 28.2* 25.7* 33.6* 32.0*  MCV 86.8 86.5 89.8  --   PLT 204 175 240  --    Basic Metabolic Panel: Recent Labs  Lab 11/16/17 0032 11/16/17 0623 11/16/17 1032 11/18/17 0447 11/19/17 0039 11/19/17 0053  NA 120* 124* 125* 133* 134* 132*  K 3.6 3.6 3.6 4.5 3.8 3.7  CL 81* 89* 88* 98 97* 95*  CO2 29 28 28 29 27   --   GLUCOSE 103* 95 85 97 121* 118*  BUN 14 13 13  7* 6* 7*  CREATININE 1.01* 1.00 1.04* 0.86 0.82 0.80  CALCIUM 9.5 8.8* 9.3 9.1 10.3  --    GFR: Estimated Creatinine Clearance: 71.7 mL/min (by C-G formula based on SCr of 0.8 mg/dL). Liver Function Tests: Recent Labs  Lab 11/19/17 0039  AST 19  ALT 18  ALKPHOS 51  BILITOT 0.9  PROT 6.8  ALBUMIN 4.3   No results for input(s): LIPASE, AMYLASE in the last 168 hours. Recent Labs  Lab 11/19/17 0039  AMMONIA <9*   Coagulation Profile: No results for input(s): INR, PROTIME in the last 168 hours. Cardiac Enzymes: Recent Labs  Lab 11/16/17 0032  TROPONINI <0.03   BNP (last 3 results) No results for input(s): PROBNP in the last 8760 hours. HbA1C: No results for input(s): HGBA1C in the last 72 hours. CBG: Recent Labs  Lab 11/16/17 0039 11/18/17 2306  GLUCAP 104* 71   Lipid Profile: No results for input(s): CHOL, HDL, LDLCALC, TRIG, CHOLHDL, LDLDIRECT in the last 72 hours. Thyroid Function Tests: No results for input(s): TSH, T4TOTAL, FREET4, T3FREE, THYROIDAB in the last 72 hours. Anemia Panel: No results for input(s): VITAMINB12, FOLATE, FERRITIN, TIBC, IRON, RETICCTPCT in the last 72 hours. Sepsis Labs: Recent Labs  Lab 11/19/17 0054  LATICACIDVEN 1.09    Recent Results (from the past 240 hour(s))  Urine culture     Status: None   Collection Time: 11/19/17 12:24 AM  Result Value Ref Range Status   Specimen Description URINE, CATHETERIZED  Final   Special Requests  NONE  Final   Culture   Final    NO GROWTH Performed at [facility], [address], [place], [place] [address]    Report Status 11/20/2017 FINAL  Final         Radiology Studies: Dg Chest 1 View  Result Date: 11/19/2017 CLINICAL DATA:  Chest pain and confusion. Difficulty urinating. Altered mental status. EXAM: CHEST  1 VIEW COMPARISON:  11/16/2017 FINDINGS: The heart size and mediastinal contours are within normal limits. Both lungs are clear. The visualized skeletal structures are unremarkable. IMPRESSION: No active disease. Electronically Signed   By: Lucienne Capers M.D.   On: 11/19/2017 01:01   Mr Brain Wo Contrast  Result Date: 11/20/2017 CLINICAL DATA:  Altered mental status.  Hyponatremia. EXAM: MRI HEAD WITHOUT CONTRAST TECHNIQUE: Multiplanar, multiecho pulse sequences of the brain and surrounding structures were obtained without intravenous contrast. COMPARISON:  Head CT 11/16/2017 and MRI 02/04/2014 FINDINGS: Multiple sequences are mildly to moderately motion degraded. Brain: There is no evidence of acute infarct, intracranial hemorrhage, mass, midline shift, or extra-axial fluid collection.  There is mild cerebral atrophy. Scattered cerebral white matter T2 hyperintensities are unchanged from the prior MRI and advanced for age. There is normal signal in the brainstem. Vascular: Major intracranial vascular flow voids are preserved. Skull and upper cervical spine: Unremarkable bone marrow signal. Sinuses/Orbits: Unremarkable orbits. Paranasal sinuses and mastoid air cells are clear. Other: None. IMPRESSION: 1. No acute intracranial abnormality. 2. Mildly age advanced cerebral white matter disease, unchanged from 2015 and nonspecific. Considerations include chronic small vessel ischemia, vasculitis, migraines, prior infection/inflammation, and demyelinating disease. Electronically Signed   By: Logan Bores M.D.   On: 11/20/2017 10:52        Scheduled Meds: . atorvastatin   20 mg Oral Daily  . carvedilol  12.5 mg Oral BID WC  . famotidine  20 mg Oral BID  . fluticasone  2 spray Each Nare Daily  . lamoTRIgine  300 mg Oral QHS  . lisinopril  20 mg Oral BID  . montelukast  10 mg Oral QPM  . potassium citrate  20 mEq Oral BID  . tamsulosin  0.4 mg Oral Daily  . umeclidinium-vilanterol  1 puff Inhalation Daily  . venlafaxine XR  150 mg Oral Q breakfast  . zolpidem  5 mg Oral QHS   Continuous Infusions:   LOS: 0 days    Time spent:25 mins. More than 50% of that time was spent in counseling and/or coordination of care.      Shelly Coss, MD [facility] Pager [phone]  If 7PM-7AM, please contact night-coverage [email or web address] Password [facility] 11/20/2017, 12:45 PM

## 2017-11-20 NOTE — Consult Note (Addendum)
Neurology Consultation  Reason for Consult: Altered mental status  Referring Physician: Dr. Tawanna Solo  History is obtained from: Chart, Patient and Friend at bedside  HPI: Erin Lopez is a 67 y.o. female with bipolar disorder, hypertension, COPD.  Patient apparently fell on Thursday and hit her head per friend at bedside but did not lose consciousness.  Apparently after this fall she was alert and oriented called 911 was brought to the hospital.  Patient was doing well until Saturday at which time the friend noticed that she was acting abnormal, seeing things in the room, talking incoherently.  Her note patient has been drinking a lot of fluids up to 1 gallon a day for the past month.  Looking through her labs on admission patient had a sodium of 120, chloride 81, BUN 14, creatinine 1.01, urinalysis was colorless with no nitrites or leukocytes.  Upon walking in the room, patient immediately tells me to leave.  She then goes into noncoherent speaking stating " incoming, incoming, intestines, there they are, bluebird on the latter and so on."  She would not let me get close to her and when asked questions patient would become agitated and told me to leave.  I asked her friend if this has been going on for the past couple of days and she stated that that is what has been going on. Last night apparently she was "pushing away the dust", consistent with formed visual hallucination.   LKW: 11/17/2017 Premorbid modified Rankin scale (mRS): 0   ROS:  Unable to obtain due to altered mental status.   Past Medical History:  Diagnosis Date  . Alcoholism in remission [facility])    states last alcohol 11 yrs ago  . Anxiety   . Asymptomatic gallstones   . Bipolar 1 disorder [facility])    sees a psychiatrist  . Cervical spondylosis without myelopathy 12/19/2013   MRI 02/2014: multilevel DDD/spondylosis, not much change compared to prior MRI.  Pt not in favor of invasive therapy for her neck as of 04/2014.  Marland Kitchen  Chronic renal insufficiency, stage II (mild)   . COPD (chronic obstructive pulmonary disease) (HCC)    Moderate: anoro started 04/2017 by pulm, pt symptomatically improved and PFTs stable at f/u 06/2017--pulm f/u planned 1 yr.  . Endometritis 05/2017   possible; empiric tx with Flagyl by GYN.  . Environmental allergies   . Fibromyalgia   . GERD (gastroesophageal reflux disease)   . Herpes zoster 08/07/2017   L scapular region  . Hiatal hernia   . History of adenomatous polyp of colon 04/2008; 08/2014   No high grade dysplasia: recall 5 yrs ([name], [name])  . History of basal cell carcinoma excision    NOSE  . History of Helicobacter pylori infection 04/2008   +gastric biopsy (gastritis but no metaplasia, dysplasia, or malignancy identified)  . History of TIA (transient ischemic attack)    secondary to HELLP syndrome 1994 post c/s--  residual memory loss  . Hyperlipidemia    no meds in > a decade per pt.  Recommended statin 06/21/16.  Marland Kitchen Hypertension   . IFG (impaired fasting glucose) 06/2016   Fasting gluc 105; HbA1c at that time was 6.0%.  . Internal hemorrhoids   . 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 04/2017.  . Memory loss    Abnl MRI brain and CT  brain c/w chronic microvascular ischemia.  Repeat MRI brain 02/2014 stable (Dr. Mayra Reel with no plan to f/u with neuro as of 04/2014)  . Osteoporosis 05/2010   2012 'penia; 06/2015 'porosis:  prolia.  repeat DEXA 2 yrs.  As of 12/2016 pt going to discuss w/GYN whether or not to continue prolia.  DEXA 09/2017 T-score -2.2 femur neck.  . Pelvic floor dysfunction    [facility] ([name])  . Postmenopausal vaginal bleeding 05/2017   x 1 small episode; GYN attempted endo bx but unable to penetrate cervix due to severe cerv stenosis (due to  postmenopausal state + hx of LEEP).  Endo u/s showed thin endo lining.  Per GYN---no evidence of endomet pathology on w/u---obs as of 07/2017.  Marland Kitchen Recurrent kidney stones 08/2013   Left ureteral calculus: Perc nephr--cystoscopy w/ureteroscopy + laser for stone removal.  Residual asymptomatic left renal nephrolithiasis <60mm present post-procedure.  Right sided hydronephrosis-persistent (on u/s)--urol ordered CT to further eval 08/2016: 1.6 cm nonobstructing stone lower pole left kidney, no hydro, plan for PCN extraction ([facility])  . Shortness of breath   . Sprain of neck 12/19/2013  . TMJ (temporomandibular joint disorder)    USES  MOUTH GUARD  . Vitamin D deficiency 05/2011  . Weak urinary stream 07/2016   with elevated PVR and mild left hydronephrosis secondary to this ([facility] started flomax 0.4mg  qd for this).     Family History  Problem Relation Age of Onset  . Alcohol abuse Mother   . Arthritis Mother   . Hypertension Mother   . Hyperlipidemia Mother   . Diabetes Mother   . Parkinsonism Mother   . Breast cancer Mother   . Prostate cancer Father   . Hypertension Father   . Hyperlipidemia Father   . Diabetes Father   . Heart disease Father   . Esophageal cancer Sister   . Colon polyps Sister   . Colon polyps Brother   . Heart disease Brother        x 2  . Irritable bowel syndrome Sister      Social History:   reports that she quit smoking about 24 years ago. Her smoking use included cigarettes. She has a 33.00 pack-year smoking history. She has never used smokeless tobacco. She reports that she does not drink alcohol or use drugs.  Medications  Current Facility-Administered Medications:  .  acetaminophen (TYLENOL) tablet 650 mg, 650 mg, Oral, Q4H PRN **OR** acetaminophen (TYLENOL) solution 650 mg, 650 mg, Per Tube, Q4H PRN **OR** acetaminophen (TYLENOL) suppository 650 mg, 650 mg, Rectal, Q4H PRN, Rise Patience, MD .  albuterol (PROVENTIL) (2.5 MG/3ML) 0.083%  nebulizer solution 2.5 mg, 2.5 mg, Inhalation, Q4H PRN, Rise Patience, MD .  ALPRAZolam Duanne Moron) tablet 1 mg, 1 mg, Oral, TID PRN, Rise Patience, MD .  atorvastatin (LIPITOR) tablet 20 mg, 20 mg, Oral, Daily, Gean Birchwood N, MD .  carvedilol (COREG) tablet 12.5 mg, 12.5 mg, Oral, BID WC, Rise Patience, MD .  cyclobenzaprine (FLEXERIL) tablet 10 mg, 10 mg, Oral, BID PRN, Rise Patience, MD .  famotidine (PEPCID) tablet 20 mg, 20 mg, Oral, BID, Rise Patience, MD .  fluticasone (FLONASE) 50 MCG/ACT nasal spray 2 spray, 2 spray, Each Nare, Daily, Rise Patience, MD .  lamoTRIgine (LAMICTAL) tablet 300 mg, 300 mg, Oral, QHS, Rise Patience, MD, 300 mg at 11/19/17 2148 .  lisinopril (PRINIVIL,ZESTRIL) tablet 20 mg, 20 mg, Oral, BID, Rise Patience, MD .  LORazepam (ATIVAN) injection 1 mg, 1 mg, Intravenous, Once, Adhikari, Amrit, MD .  montelukast (SINGULAIR) tablet 10 mg, 10 mg, Oral, QPM, Rise Patience, MD .  potassium citrate (UROCIT-K) SR tablet 20 mEq, 20 mEq, Oral, BID, Rise Patience, MD .  tamsulosin (FLOMAX) capsule 0.4 mg, 0.4 mg, Oral, Daily, Rise Patience, MD .  umeclidinium-vilanterol (ANORO ELLIPTA) 62.5-25 MCG/INH 1 puff, 1 puff, Inhalation, Daily, Rise Patience, MD .  venlafaxine XR (EFFEXOR-XR) 24 hr capsule 150 mg, 150 mg, Oral, Q breakfast, Rise Patience, MD .  zolpidem (AMBIEN) tablet 5 mg, 5 mg, Oral, QHS, Rise Patience, MD   Exam: Current vital signs: BP 122/76 (BP Location: Left Arm)   Pulse 90   Temp 98.1 F (36.7 C) (Oral)   Resp (!) 22   Ht 5\' 8"  (1.727 m)   Wt 71.7 kg   SpO2 97%   BMI 24.03 kg/m  Vital signs in last 24 hours: Temp:  [98 F (36.7 C)-99.4 F (37.4 C)] 98.1 F (36.7 C) (08/19 0418) Pulse Rate:  [90] 90 (08/19 0418) Resp:  [17-22] 22 (08/19 0650) BP: (122-147)/(72-89) 122/76 (08/19 0650) SpO2:  [94 %-97 %] 97 % (08/19 0650)  GENERAL: Awake,   HEENT: - Normocephalic he does have a bruise on the frontal portion of her head  Ext: warm, well perfused, intact peripheral pulses,   NEURO:  Mental Status: Patient is alert however she is incoherent, follows no commands, is talking nonsensically.  She becomes agitated when NP gets close to her. On follow up exam by attending, the patient is cooperative and attentive, non-agitated and non-oppositional, but with an odd affect; speech is also tangetial. She is non-agitated and does not appear to be delirious. Speech is fluent with intact naming and comprehension at time of attending exam; however, interpretation of a proverb is concrete rather than abstract. When repeating, she gives a garbled response that has a quality more consistent with non-physiological etiology than an aphasia.  Cranial Nerves: PERRL at 2 mm however I was not able to close enough to get a good exam. EOMI. Due to oppositional behavior versus agitation she would not count NP's fingers, but would name examiner's fingers shortly thereafter with attending. No facial asymmetry, hearing intact, was not able to assess tongue or soft palate Motor: Moving all extremities with good strength antigravity; however, NP was not able to do formal strength testing on her. With attending, she is cooperative with 5/5 strength and no asymmetry.  Sensation-would not allow NP to touch her. With attending she has normal FT sensation x 4.  Coordination: No ataxia with FNF bilaterally.  Gait- deferred  Labs I have reviewed labs in epic and the results pertinent to this consultation are:   CBC    Component Value Date/Time   WBC 9.1 11/19/2017 0039   RBC 3.74 (L) 11/19/2017 0039   HGB 10.9 (L) 11/19/2017 0053   HCT 32.0 (L) 11/19/2017 0053   PLT 240 11/19/2017 0039   MCV 89.8 11/19/2017 0039   MCH 30.7 11/19/2017 0039   MCHC 34.2 11/19/2017 0039   RDW 12.2 11/19/2017 0039   LYMPHSABS 0.9 11/19/2017 0039   MONOABS 0.5 11/19/2017 0039    EOSABS 0.0 11/19/2017 0039   BASOSABS 0.0 11/19/2017 0039    CMP     Component Value Date/Time   NA 132 (L) 11/19/2017 0053   K 3.7 11/19/2017 0053   CL 95 (L) 11/19/2017 0053   CO2 27 11/19/2017 0039  GLUCOSE 118 (H) 11/19/2017 0053   BUN 7 (L) 11/19/2017 0053   CREATININE 0.80 11/19/2017 0053   CREATININE 1.27 (H) 05/16/2016 1525   CALCIUM 10.3 11/19/2017 0039   PROT 6.8 11/19/2017 0039   ALBUMIN 4.3 11/19/2017 0039   AST 19 11/19/2017 0039   ALT 18 11/19/2017 0039   ALKPHOS 51 11/19/2017 0039   BILITOT 0.9 11/19/2017 0039   GFRNONAA >60 11/19/2017 0039   GFRAA >60 11/19/2017 0039    Lipid Panel     Component Value Date/Time   CHOL 168 07/18/2017 1131   TRIG 86.0 07/18/2017 1131   HDL 66.20 07/18/2017 1131   CHOLHDL 3 07/18/2017 1131   VLDL 17.2 07/18/2017 1131   LDLCALC 85 07/18/2017 1131   LDLDIRECT 144.1 05/29/2013 1156     Imaging I have reviewed the images obtained:  EEG-The EEG is abnormal and findings are suggestive of mild generalized cerebral dysfunction. Epileptiform features were not seen during this recording.  CT-scan of the brain--no acute intracranial abnormalities.  Cortical atrophy.  MRI examination of the brain-- IMPRESSION: 1. No acute intracranial abnormality. 2. Mildly age advanced cerebral white matter disease, unchanged from 2015 and nonspecific. Considerations include chronic small vessel ischemia, vasculitis, migraines, prior infection/inflammation, and demyelinating disease.  Assessment: 67 year old female status post fall initially with coherent thinking now presenting with incoherent thinking at the point in time that she was going to be discharged.  EEG normal.  MRI negative.  Given both MRI and EEG are negative would consider psychiatric consult.   Recommendations: -Psych consult --Please call neurology if there are additional questions.   I have seen and examined the patient. I have discussed the assessment and  recommendations with Etta Quill, NP Electronically signed: Dr. Kerney Elbe
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
## 2017-11-20 NOTE — Telephone Encounter (Signed)
[place] [facility] - Client TELEPHONE [facility] Patient Name: Erin Lopez Gender: Female DOB: 1950-12-11  Age: 67 Y 10 M 20 D Return Phone Number: [phone] (Primary) Address:  City/State/Zip: [place]  [address] Client [place] [facility] - Client Client Site [facility] Physician Crissie Sickles - MD Contact Type Call Who Is Calling Patient / Member / Family / Caregiver Call Type Triage / Clinical Caller Name Sofie Hartigan Relationship To Patient Friend Return Phone Number [phone] (Primary) Chief Complaint CONFUSION - new onset Reason for Call Symptomatic / Request for Health Information Initial Comment Pt just home from hospital and acting confused. Translation No Nurse Assessment Nurse: Lavera Guise, RN, Vaughan Basta Date/Time (Eastern Time): 11/18/2017 4:35:02 PM Confirm and document reason for call. If symptomatic, describe symptoms. ---Caller states seeing paranoia and confusion. Patient was just discharged from hospital today after a fall. Was having the symptoms when they discharged her. Does the patient have any new or worsening symptoms? ---Yes Will a triage be completed? ---Yes Related visit to physician within the last 2 weeks? ---Yes Does the PT have any chronic conditions? (i.e. diabetes, asthma, this includes High risk factors for pregnancy, etc.) ---Yes Is this a behavioral health or substance abuse call? ---No Guidelines Guideline Title Affirmed Question Affirmed Notes Nurse Date/Time Eilene Ghazi Time) Confusion - Delirium Very strange or paranoid behavior  Lavera Guise, RNVaughan Basta 11/18/2017 4:38:23 PM Disp. Time Eilene Ghazi Time) Disposition Final User 11/18/2017 4:32:41 PM Send to Urgent Forestine Na 11/18/2017 4:43:55 PM Paged On Call [facility] to [facility], [place] 11/18/2017 5:24:54 PM Paged On Call back to [facility], [place] 11/18/2017 5:57:01 PM Attempt made -  message left Daisy Lazar 11/18/2017 5:57:31 PM Called On-Call Provider Lavera Guise, RN, [facility] 11/18/2017 5:57:53 PM Attempt made - line busy [place], [place] 11/18/2017 4:41:58 PM Go to ED Now Yes Lavera Guise, RN, Yetta Numbers NOTE:  All timestamps contained within this report are represented as [place] Standard Time. CONFIDENTIALTY NOTICE: This fax transmission is intended only for the addressee.  It contains information that is legally privileged, confidential or otherwise protected from use or disclosure.  If you are not the intended recipient, you are strictly prohibited from reviewing, disclosing, copying using or disseminating any of this information or taking any action in reliance on or regarding this information.  If you have received this fax in error, please notify us immediately by telephone so that we can arrange for its return to [place]. Phone:  [phone], Toll-Free:  [phone], Fax:  [phone] Page: 2 of 2 Call Id: [id number]     [place] Disagree/Comply Comply Caller Understands Yes PreDisposition Did not know what to do Care Advice Given Per Guideline GO TO ED NOW: * Leave now. Drive carefully. DRIVING: Another adult should drive. BRING MEDICINES: CARE ADVICE given per Confusion-Delirium (Adult) guideline. Comments User: Ricard Dillon, RN Date/Time Eilene Ghazi Time): 11/18/2017 6:14:10 PM Called caller twice after speaking with the Dr. Tomma Lightning went to voice mail. Instructed caller to call back with further concerns. Referrals [facility] - ED [facility] - ED [facility] - ED Paging DoctorName Phone DateTime Result/Outcome Message Type Notes Eliezer Lofts - MD [phone] 11/18/2017 4:43:55 PM Paged On Call Back to Call Center Doctor Paged Please call Vaughan Basta, RN concerning this patient. Victor Eliezer Lofts - MD [phone] 11/18/2017 5:24:54 PM Called On Call Provider - Left Message Doctor Paged Renato Shin -  MD [phone] 11/18/2017 5:57:31 PM Called On Call  Provider - Reached Doctor Paged Renato Shin - MD 11/18/2017 5:57:42 PM Spoke with On Call - General Message Result Dr. states ok to send patient back to the ER.

## 2017-11-20 NOTE — Telephone Encounter (Signed)
Noted  

## 2017-11-21 DIAGNOSIS — G9340 Encephalopathy, unspecified: Secondary | ICD-10-CM | POA: Diagnosis not present

## 2017-11-21 MED ORDER — QUETIAPINE FUMARATE 50 MG PO TABS
50.0000 mg | ORAL_TABLET | Freq: Two times a day (BID) | ORAL | Status: DC | PRN
Start: 2017-11-21 — End: 2017-11-23
  Administered 2017-11-21 – 2017-11-23 (×5): 50 mg via ORAL
  Filled 2017-11-21 (×5): qty 1

## 2017-11-21 MED ORDER — QUETIAPINE FUMARATE 50 MG PO TABS
100.0000 mg | ORAL_TABLET | Freq: Every day | ORAL | Status: DC
  Administered 2017-11-21 – 2017-11-22 (×2): 100 mg via ORAL
  Filled 2017-11-21 (×2): qty 2

## 2017-11-21 MED ORDER — POTASSIUM CHLORIDE CRYS ER 20 MEQ PO TBCR
40.0000 meq | EXTENDED_RELEASE_TABLET | Freq: Once | ORAL | Status: DC
  Filled 2017-11-21: qty 2

## 2017-11-21 MED ORDER — CLONAZEPAM 0.5 MG PO TABS
0.5000 mg | ORAL_TABLET | Freq: Two times a day (BID) | ORAL | Status: DC | PRN
  Administered 2017-11-22 – 2017-11-23 (×3): 0.5 mg via ORAL
  Filled 2017-11-21 (×5): qty 1

## 2017-11-21 NOTE — Progress Notes (Signed)
Foley catheter removed per MD order. 

## 2017-11-21 NOTE — Progress Notes (Signed)
PROGRESS NOTE    Erin Lopez  WCH:[id number] DOB: [birth year] DOA: 11/18/2017 PCP: Tammi Sou, MD   Brief Narrative:  Patient is a 67 year old female with past medical history of bipolar disorder, hypertension, COPD was just discharged on 11/18/17 after being managed for severe hyponatremia.  At home , patient became very confused and agitated.  Had to be brought to the ER.  Found to be hemodynamically  stable on presentation.  Neurology consulted but have signed off.  MRI done here did not show any acute intracranial abnormalities.  Psychiatry consulted adjusted .  She is stable for discharge to skilled nursing facility whenever the bed is available   Assessment & Plan:   Principal Problem:   Bipolar 1 disorder ([place]) Active Problems:   COPD (chronic obstructive pulmonary disease) ([place])   Hypertension   Acute encephalopathy  Altered mental status:   Most likely her altered mental status is secondary to her psychiatric issues.  She is completely alert and oriented now. MRI of the brain did not show acute intracranial abnormalities.  Neurological exam is unremarkable.  She does not have any evidence of metabolic encephalopathy.  Lab works non impressive.  Psychiatry adjusted the medication and started on Seroquel.  Severe hyponatremia:Resolved.Presented with severe hyponatremia on last admission secondary to increased intake of free water at home.She has H/O kidney stones and follows with urology as an outpatient.There could be component of SIADH also.  We have advised her not to drink too much of free water at home and just drink appropriate amount.  COPD: Currently stable.  Respiratory status stable.  Resume home medications.  Hypertension: On chlorthalidone at home which will be held. Will continue lisinopril .Currently blood pressure stable.  Bipolar disorder: On Lamictal and Effexor which have been discontinued.   Started on Seroquel and Klonopin for  anxiety.  CKD stage PN:[id number] kidney function stable and is on baseline.  Chronic pain syndrome: Oxycodone  UTI: Was taking doxycycline as an outpatient.  UA done on admission here was non-impressive for UTI.  Antibiotics will be discontinued.  Hypokalemia: Supplemented with potassium.  Deconditioning/debility: PT evaluated and recommended skilled nursing facility on discharge.  Social worker consulted.  DVT prophylaxis: SCD Code Status: Full Family Communication: None present at the bedside  disposition Plan: Skilled nursing facility as soon as bed is available.  Might be tomorrow   Consultants: Neurology, psychiatry  Procedures: None  Antimicrobials: None  Subjective: Patient seen and examined at bedside this morning.  She was very comfortable this morning.  Alert and oriented and communicative.  Denies any complaints. Objective: Vitals:   11/20/17 2238 11/21/17 0318 11/21/17 0843 11/21/17 1045  BP: 130/73 126/80  140/70  Pulse: (!) 107 90  87  Resp:  17  18  Temp: 97.8 F (36.6 C) 98.3 F (36.8 C)  98 F (36.7 C)  TempSrc: Oral Oral  Oral  SpO2: 99% 100% 99% 99%  Weight:      Height:        Intake/Output Summary (Last 24 hours) at 11/21/2017 1536 Last data filed at 11/21/2017 1000 Gross per 24 hour  Intake -  Output 1150 ml  Net -1150 ml   Filed Weights   11/19/17 0416  Weight: 71.7 kg    Examination:  General exam: Appears calm and comfortable ,Not in distress,average built HEENT:PERRL,Oral mucosa moist, Ear/Nose normal on gross exam Respiratory system: Bilateral equal air entry, normal vesicular breath sounds, no wheezes or crackles  Cardiovascular system: S1 & S2  heard, RRR. No JVD, murmurs, rubs, gallops or clicks. No pedal edema. Gastrointestinal system: Abdomen is nondistended, soft and nontender. No organomegaly or masses felt. Normal bowel sounds heard. Central nervous system: Alert and oriented. No focal neurological deficits. Extremities:  No edema, no clubbing ,no cyanosis, distal peripheral pulses palpable. Skin: No rashes, lesions or ulcers,no icterus ,no pallor   Data Reviewed: I have personally reviewed following labs and imaging studies  CBC: Recent Labs  Lab 11/16/17 0032 11/16/17 0623 11/19/17 0039 11/19/17 0053 11/20/17 1203  WBC 6.3 5.0 9.1  --  8.2  NEUTROABS  --   --  7.6  --  6.8  HGB 9.9* 9.2* 11.5* 10.9* 11.6*  HCT 28.2* 25.7* 33.6* 32.0* 33.7*  MCV 86.8 86.5 89.8  --  88.2  PLT 204 175 240  --  741   Basic Metabolic Panel: Recent Labs  Lab 11/16/17 0623 11/16/17 1032 11/18/17 0447 11/19/17 0039 11/19/17 0053 11/20/17 1203  NA 124* 125* 133* 134* 132* 136  K 3.6 3.6 4.5 3.8 3.7 3.3*  CL 89* 88* 98 97* 95* 100  CO2 28 28 29 27   --  25  GLUCOSE 95 85 97 121* 118* 164*  BUN 13 13 7* 6* 7* 15  CREATININE 1.00 1.04* 0.86 0.82 0.80 0.85  CALCIUM 8.8* 9.3 9.1 10.3  --  9.7   GFR: Estimated Creatinine Clearance: 67.5 mL/min (by C-G formula based on SCr of 0.85 mg/dL). Liver Function Tests: Recent Labs  Lab 11/19/17 0039  AST 19  ALT 18  ALKPHOS 51  BILITOT 0.9  PROT 6.8  ALBUMIN 4.3   No results for input(s): LIPASE, AMYLASE in the last 168 hours. Recent Labs  Lab 11/19/17 0039  AMMONIA <9*   Coagulation Profile: No results for input(s): INR, PROTIME in the last 168 hours. Cardiac Enzymes: Recent Labs  Lab 11/16/17 0032  TROPONINI <0.03   BNP (last 3 results) No results for input(s): PROBNP in the last 8760 hours. HbA1C: No results for input(s): HGBA1C in the last 72 hours. CBG: Recent Labs  Lab 11/16/17 0039 11/18/17 2306  GLUCAP 104* 71   Lipid Profile: No results for input(s): CHOL, HDL, LDLCALC, TRIG, CHOLHDL, LDLDIRECT in the last 72 hours. Thyroid Function Tests: No results for input(s): TSH, T4TOTAL, FREET4, T3FREE, THYROIDAB in the last 72 hours. Anemia Panel: No results for input(s): VITAMINB12, FOLATE, FERRITIN, TIBC, IRON, RETICCTPCT in the last 72  hours. Sepsis Labs: Recent Labs  Lab 11/19/17 0054  LATICACIDVEN 1.09    Recent Results (from the past 240 hour(s))  Urine culture     Status: None   Collection Time: 11/19/17 12:24 AM  Result Value Ref Range Status   Specimen Description URINE, CATHETERIZED  Final   Special Requests NONE  Final   Culture   Final    NO GROWTH Performed at [facility], [address] [place], [place] [address]    Report Status 11/20/2017 FINAL  Final         Radiology Studies: Mr Brain Wo Contrast  Result Date: 11/20/2017 CLINICAL DATA:  Altered mental status.  Hyponatremia. EXAM: MRI HEAD WITHOUT CONTRAST TECHNIQUE: Multiplanar, multiecho pulse sequences of the brain and surrounding structures were obtained without intravenous contrast. COMPARISON:  Head CT 11/16/2017 and MRI 02/04/2014 FINDINGS: Multiple sequences are mildly to moderately motion degraded. Brain: There is no evidence of acute infarct, intracranial hemorrhage, mass, midline shift, or extra-axial fluid collection. There is mild cerebral atrophy. Scattered cerebral white matter T2  hyperintensities are unchanged from the prior MRI and advanced for age. There is normal signal in the brainstem. Vascular: Major intracranial vascular flow voids are preserved. Skull and upper cervical spine: Unremarkable bone marrow signal. Sinuses/Orbits: Unremarkable orbits. Paranasal sinuses and mastoid air cells are clear. Other: None. IMPRESSION: 1. No acute intracranial abnormality. 2. Mildly age advanced cerebral white matter disease, unchanged from 2015 and nonspecific. Considerations include chronic small vessel ischemia, vasculitis, migraines, prior infection/inflammation, and demyelinating disease. Electronically Signed   By: Logan Bores M.D.   On: 11/20/2017 10:52        Scheduled Meds: . atorvastatin  20 mg Oral Daily  . carvedilol  12.5 mg Oral BID WC  . famotidine  20 mg Oral BID  . fluticasone  2 spray Each Nare Daily  .  lisinopril  20 mg Oral BID  . montelukast  10 mg Oral QPM  . potassium chloride  40 mEq Oral Once  . potassium citrate  20 mEq Oral BID  . QUEtiapine  100 mg Oral QHS  . tamsulosin  0.4 mg Oral Daily  . umeclidinium-vilanterol  1 puff Inhalation Daily   Continuous Infusions:   LOS: 0 days    Time spent:25 mins. More than 50% of that time was spent in counseling and/or coordination of care.      Shelly Coss, MD [facility] Pager [phone]  If 7PM-7AM, please contact night-coverage [email or web address] Password [facility] 11/21/2017, 3:36 PM

## 2017-11-21 NOTE — Clinical Social Work Note (Signed)
Clinical Social Work Assessment  Patient Details  Name: Erin Lopez MRN: [id number] Date of Birth: Jun 10, 1950  Date of referral:  11/21/17               Reason for consult:  Facility Placement                Permission sought to share information with:  Facility Sport and exercise psychologist, Family Supports Permission granted to share information::  Yes, Verbal Permission Granted  Name::     Theodis Sato  Agency::  SNF  Relationship::  Daughter  Contact Information:     Housing/Transportation Living arrangements for the past 2 months:  [place] of Information:  Medical Team, Adult Children Patient Interpreter Needed:  None Criminal Activity/Legal Involvement Pertinent to Current Situation/Hospitalization:  No - Comment as needed Significant Relationships:  Adult Children Lives with:  Self Do you feel safe going back to the place where you live?  Yes Need for family participation in patient care:  Yes (Comment)  Care giving concerns:  Patient from home alone, and will need short term rehab at discharge.   Social Worker assessment / plan:  CSW spoke with patient's daughter over the phone as patient is disoriented. CSW explained discharge recommendations to patient's daughter and texted her a list of facilities in [place]. CSW to fax out referral and follow.  Employment status:  Retired Nurse, adult PT Recommendations:  [place] / Referral to community resources:  [place]  Patient/Family's Response to care:  Patient's daughter agreeable to SNF placement if it's something that the patient will agree to (but patient is confused).  Patient/Family's Understanding of and Emotional Response to Diagnosis, Current Treatment, and Prognosis:  Patient's daughter discussed how she knows that her mother isn't herself right now, but that she cannot be there with her 24/7. Patient's daughter works full time  and is unable to take time off. Patient's daughter will review facilities and reply with preferences.  Emotional Assessment Appearance:  Appears stated age Attitude/Demeanor/Rapport:  Unable to Assess Affect (typically observed):  Unable to Assess Orientation:  Oriented to Self Alcohol / Substance use:  Not Applicable Psych involvement (Current and /or in the community):  No (Comment)  Discharge Needs  Concerns to be addressed:  Care Coordination Readmission within the last 30 days:  Yes Current discharge risk:  Dependent with Mobility, Lives alone, Cognitively Impaired Barriers to Discharge:  Continued Medical Work up, Ship broker, Programmer, applications ([place])   Geralynn Ochs, LCSW 11/21/2017, 1:09 PM

## 2017-11-21 NOTE — Progress Notes (Signed)
Physical Therapy Treatment Patient Details Name: Erin Lopez MRN: [id number] DOB: 1950-12-10 Today's Date: 11/21/2017    History of Present Illness Pt is a 67 y.o. F with significant PMH of bipolar disorder, hypertension, COPD, chronic pain who was discharged due to hyponatremia 8/17; and readmitted 8/17 due to worsening confusion.     PT Comments    Ms. Giddens doing well today - much more alert and oriented. Motivated to work with PT. Able to progress mobility today with RW, but still with unsteadiness and impulsivity throughout. Requires verbal cueing for safety and stability throughout session. Making good progress throughout.    Follow Up Recommendations  SNF;Supervision/Assistance - 24 hour     Equipment Recommendations       Recommendations for Other Services       Precautions / Restrictions Precautions Precautions: Fall Restrictions Weight Bearing Restrictions: No    Mobility  Bed Mobility               General bed mobility comments: at EOB upon PT arrival  Transfers Overall transfer level: Needs assistance   Transfers: Sit to/from Stand Sit to Stand: Supervision         General transfer comment: for safety and immediate standing balance  Ambulation/Gait Ambulation/Gait assistance: Min guard Gait Distance (Feet): (hallway ambulation) Assistive device: Rolling walker (2 wheeled) Gait Pattern/deviations: Step-through pattern;Decreased stride length;Trunk flexed;Drifts right/left Gait velocity: varying throughout   General Gait Details: unsteady gait pattern with impulsivity throughout; requires min guard throughout for safety   Stairs             Wheelchair Mobility    Modified Rankin (Stroke Patients Only)       Balance Overall balance assessment: Needs assistance Sitting-balance support: No upper extremity supported;Feet supported Sitting balance-Leahy Scale: Good     Standing balance support: Bilateral upper extremity  supported;During functional activity Standing balance-Leahy Scale: Fair                              Cognition Arousal/Alertness: Awake/alert Behavior During Therapy: WFL for tasks assessed/performed;Impulsive Overall Cognitive Status: Within Functional Limits for tasks assessed                                 General Comments: A&O x 4; worried about speaking to the MD and missing his call      Exercises      General Comments        Pertinent Vitals/Pain Pain Assessment: No/denies pain    Home Living                      Prior Function            PT Goals (current goals can now be found in the care plan section) Acute Rehab PT Goals Patient Stated Goal: "I need to talk to the Doctor" Time For Goal Achievement: 12/03/17 Potential to Achieve Goals: Fair Progress towards PT goals: Progressing toward goals    Frequency    Min 3X/week      PT Plan Current plan remains appropriate    Co-evaluation              AM-PAC PT "6 Clicks" Daily Activity  Outcome Measure  Difficulty turning over in bed (including adjusting bedclothes, sheets and blankets)?: A Little Difficulty moving from lying on back to sitting on the side  of the bed? : A Little Difficulty sitting down on and standing up from a chair with arms (e.g., wheelchair, bedside commode, etc,.)?: Unable Help needed moving to and from a bed to chair (including a wheelchair)?: A Little Help needed walking in hospital room?: A Little Help needed climbing 3-5 steps with a railing? : A Little 6 Click Score: 16    End of Session Equipment Utilized During Treatment: Gait belt Activity Tolerance: Patient tolerated treatment well Patient left: in bed;with call bell/phone within reach;with bed alarm set;with family/visitor present Nurse Communication: Mobility status PT Visit Diagnosis: Muscle weakness (generalized) (M62.81);Difficulty in walking, not elsewhere classified  (R26.2)     Time: 9295-7473 PT Time Calculation (min) (ACUTE ONLY): 17 min  Charges:  $Gait Training: 8-22 mins                     Lanney Gins, PT, DPT 11/21/17 11:41 AM Pager: [phone]

## 2017-11-21 NOTE — Care Management Note (Signed)
Case Management Note  Patient Details  Name: Erin Lopez MRN: [id number] Date of Birth: 07-Jun-1950  Subjective/Objective:     Pt in with bipolar disorder. She is from home alone. PCP: Dr Anitra Lauth Insurance: [facility] medicare               Action/Plan: Recommendations are for SNF. CM following for d/c disposition.  Expected Discharge Date:                  Expected Discharge Plan:  Skilled Nursing Facility  In-House Referral:  Clinical Social Work  Discharge planning Services     Post Acute Care Choice:    Choice offered to:     DME Arranged:    DME Agency:     HH Arranged:    [place] Agency:     Status of Service:  In process, will continue to follow  If discussed at Long Length of Stay Meetings, dates discussed:    Additional Comments:  Pollie Friar, RN 11/21/2017, 11:22 AM

## 2017-11-21 NOTE — [place] FL2 (Signed)
[place] MEDICAID FL2 LEVEL OF CARE SCREENING TOOL     IDENTIFICATION  Patient Name: Erin Lopez Birthdate: 18-Nov-1950 Sex: female Admission Date (Current Location): 11/18/2017  [facility] and [place] Number:  Herbalist and Address:  [facility], [place] [address] [place], [place] [address]      Provider Number: [id number]  Attending Physician Name and Address:  Shelly Coss, MD  Relative Name and Phone Number:       Current Level of Care: Hospital Recommended Level of Care: [place] Prior Approval Number:    Date Approved/Denied:   PASRR Number: Pending  Discharge Plan: SNF    Current Diagnoses: Patient Active Problem List   Diagnosis Date Noted  . Acute encephalopathy 11/19/2017  . Hyponatremia 11/16/2017  . Bipolar 1 disorder ([place]) 12/29/2016  . Hypertension 12/29/2016  . TIA (transient ischemic attack) 12/29/2016  . Chronic pelvic pain in female 08/22/2016  . Stage 2 chronic kidney disease 08/22/2016  . Osteoporosis 09/30/2015  . Health maintenance examination 06/11/2015  . Suprapubic discomfort 06/05/2015  . Urethral caruncle 06/05/2015  . Infection of urinary tract 06/05/2015  . TMJ (temporomandibular joint disorder) 02/25/2015  . Right ankle injury 11/05/2014  . Cervical spondylosis without myelopathy 12/19/2013  . Sprain of neck 12/19/2013  . Memory difficulties 12/19/2013  . Postnasal drip 10/28/2013  . Renal calculus 08/05/2013  . Urinary frequency 08/01/2013  . Constipation, chronic 07/29/2013  . COPD (chronic obstructive pulmonary disease) ([place]) 06/30/2013  . Abnormal chest x-ray 06/30/2013  . Benign essential HTN 05/29/2013  . Kidney stone on left side 05/29/2013  . Hyperlipidemia 05/29/2013    Orientation RESPIRATION BLADDER Height & Weight     Self  Normal Continent Weight: 158 lb 1.1 oz (71.7 kg) Height:  5\' 8"  (172.7 cm)  BEHAVIORAL SYMPTOMS/MOOD NEUROLOGICAL BOWEL NUTRITION  STATUS      Continent Diet(heart healthy)  AMBULATORY STATUS COMMUNICATION OF NEEDS Skin   Limited Assist Verbally Normal                       Personal Care Assistance Level of Assistance  Bathing, Feeding, Dressing Bathing Assistance: Limited assistance Feeding assistance: Independent Dressing Assistance: Limited assistance     Functional Limitations Info  Sight, Hearing, Speech Sight Info: Adequate Hearing Info: Adequate Speech Info: Adequate    SPECIAL CARE FACTORS FREQUENCY  PT (By licensed PT), OT (By licensed OT)     PT Frequency: 5x/wk OT Frequency: 5x/wk            Contractures Contractures Info: Not present    Additional Factors Info  Code Status, Allergies, Psychotropic Code Status Info: Full Allergies Info: Ceftin Cefuroxime Axetil, Ciprofloxacin, Fosamax Alendronate Sodium, Morphine And Related, Prednisone, Seroquel Quetiapine Psychotropic Info: Seroquel 100mg  daily at bed; Seroquel 50mg  2x/day PRN; Klonopin 0.5 mg 2x/day PRN         Current Medications (11/21/2017):  This is the current hospital active medication list Current Facility-Administered Medications  Medication Dose Route Frequency Provider Last Rate Last Dose  . acetaminophen (TYLENOL) tablet 650 mg  650 mg Oral Q4H PRN Rise Patience, MD       Or  . acetaminophen (TYLENOL) solution 650 mg  650 mg Per Tube Q4H PRN Rise Patience, MD       Or  . acetaminophen (TYLENOL) suppository 650 mg  650 mg Rectal Q4H PRN Rise Patience, MD      . albuterol (PROVENTIL) (2.5  MG/3ML) 0.083% nebulizer solution 2.5 mg  2.5 mg Inhalation Q4H PRN Rise Patience, MD   2.5 mg at 11/20/17 2152  . atorvastatin (LIPITOR) tablet 20 mg  20 mg Oral Daily Rise Patience, MD      . carvedilol (COREG) tablet 12.5 mg  12.5 mg Oral BID WC Rise Patience, MD      . clonazePAM Bobbye [place]) tablet 0.5 mg  0.5 mg Oral BID PRN Shelly Coss, MD      . cyclobenzaprine (FLEXERIL) tablet  10 mg  10 mg Oral BID PRN Rise Patience, MD      . famotidine (PEPCID) tablet 20 mg  20 mg Oral BID Rise Patience, MD   20 mg at 11/20/17 2152  . fluticasone (FLONASE) 50 MCG/ACT nasal spray 2 spray  2 spray Each Nare Daily Rise Patience, MD      . lisinopril (PRINIVIL,ZESTRIL) tablet 20 mg  20 mg Oral BID Rise Patience, MD   20 mg at 11/20/17 2152  . montelukast (SINGULAIR) tablet 10 mg  10 mg Oral QPM Rise Patience, MD      . potassium citrate (UROCIT-K) SR tablet 20 mEq  20 mEq Oral BID Rise Patience, MD   20 mEq at 11/20/17 2152  . QUEtiapine (SEROQUEL) tablet 100 mg  100 mg Oral QHS Adhikari, Amrit, MD      . QUEtiapine (SEROQUEL) tablet 50 mg  50 mg Oral BID PRN Adhikari, Amrit, MD      . tamsulosin (FLOMAX) capsule 0.4 mg  0.4 mg Oral Daily Rise Patience, MD      . umeclidinium-vilanterol [facility] ELLIPTA) 62.5-25 MCG/INH 1 puff  1 puff Inhalation Daily Rise Patience, MD   1 puff at 11/21/17 5038     Discharge Medications: Please see discharge summary for a list of discharge medications.  Relevant Imaging Results:  Relevant Lab Results:   Additional Information SS#: [id number]  Geralynn Ochs, LCSW

## 2017-11-22 ENCOUNTER — Telehealth: Payer: Self-pay | Admitting: Family Medicine

## 2017-11-22 ENCOUNTER — Encounter: Payer: Self-pay | Admitting: Family Medicine

## 2017-11-22 DIAGNOSIS — G9340 Encephalopathy, unspecified: Secondary | ICD-10-CM | POA: Diagnosis not present

## 2017-11-22 DIAGNOSIS — F319 Bipolar disorder, unspecified: Secondary | ICD-10-CM | POA: Diagnosis not present

## 2017-11-22 LAB — BASIC METABOLIC PANEL
Anion gap: 7 (ref 5–15)
BUN: 13 mg/dL (ref 8–23)
CHLORIDE: 104 mmol/L (ref 98–111)
CO2: 29 mmol/L (ref 22–32)
Calcium: 9.3 mg/dL (ref 8.9–10.3)
Creatinine, Ser: 0.92 mg/dL (ref 0.44–1.00)
GFR calc Af Amer: >60 mL/min (ref >60)
GFR calc non Af Amer: >60 mL/min (ref >60)
Glucose, Bld: 107 mg/dL — ABNORMAL HIGH (ref 70–99)
Potassium: 3.9 mmol/L (ref 3.5–5.1)
SODIUM: 140 mmol/L (ref 135–145)

## 2017-11-22 MED ORDER — QUETIAPINE FUMARATE 100 MG PO TABS: 100.0000 mg | ORAL_TABLET | Freq: Every day | ORAL | 0 refills | Status: DC

## 2017-11-22 MED ORDER — ZOLPIDEM TARTRATE 5 MG PO TABS
5.0000 mg | ORAL_TABLET | Freq: Every evening | ORAL | Status: DC | PRN
  Administered 2017-11-22 (×2): 5 mg via ORAL
  Filled 2017-11-22 (×2): qty 1

## 2017-11-22 MED ORDER — CLONAZEPAM 0.5 MG PO TABS: 0.5000 mg | ORAL_TABLET | Freq: Two times a day (BID) | ORAL | 0 refills | Status: DC | PRN

## 2017-11-22 MED ORDER — CYCLOBENZAPRINE HCL 10 MG PO TABS: 10.0000 mg | ORAL_TABLET | Freq: Two times a day (BID) | ORAL | 0 refills | Status: DC | PRN

## 2017-11-22 MED ORDER — QUETIAPINE FUMARATE 50 MG PO TABS: 50.0000 mg | ORAL_TABLET | Freq: Two times a day (BID) | ORAL | 0 refills | Status: DC | PRN

## 2017-11-22 MED ORDER — OXYCODONE HCL 10 MG PO TABS: 10.0000 mg | ORAL_TABLET | Freq: Four times a day (QID) | ORAL | 0 refills | Status: DC | PRN

## 2017-11-22 NOTE — Telephone Encounter (Signed)
Noted  

## 2017-11-22 NOTE — Care Management (Signed)
Pt refusing d/c tonight. She feels mentally she is not able to d/c to SNF or home. MD updated. Pt provided information for [employer] and forms that go with the process.

## 2017-11-22 NOTE — Progress Notes (Signed)
Patient voiced concerns about being discharged to SNF yesterday and today. Patient became tearful and voiced concerns about being grouped with general population while still going through psychiatric issues. Patient stated multiple times that she felt like she would benefit from a psychiatric unit, rather than a SNF. Patient appeared anxious throughout shift. Patient voiced concerns about not feeling like meds were taking care of symptoms before being discharged.

## 2017-11-22 NOTE — Telephone Encounter (Signed)
Noted. D/C summary and psych consult note from recent admission reviewed today. Pt's psych meds were changed to seroquel and clonazepam but all other meds were continued.

## 2017-11-22 NOTE — Telephone Encounter (Signed)
FYI

## 2017-11-22 NOTE — Progress Notes (Signed)
CSW following for discharge plan. CSW met with patient several times throughout the day today to discuss SNF placement and discharge planning. Patient was agitated during discussions, talking about how she feels unstable and afraid of what she might do if she's discharged. Patient would intermittently cry and laugh at inappropriate times, and then apologize and say she's "crazy". CSW attempted to redirect patient to discussing SNF options, and patient would repeatedly go off on tangential discussions about the state of the world without allowing CSW to say anything in response. Patient had her friend, [name], on the phone for part of the discussion because she said that she wanted another set of ears because she didn't trust herself. Patient said that if she did have to go to a facility, she would prefer [facility] because it's close to where her friend lives and her friend can check on her if she goes crazy. [facility] does not have any beds available, patient is unable to discharge there. Patient discussed how she felt like she needed to go into an induced coma while her body changes on her medications, because she didn't really know what was going to happen to her.   Patient has not selected a SNF and has not obtained insurance authorization, will not be able to admit to SNF today. Patient requesting another psych consult because she feels "crazy" and "unstable" (her words), and thinks that she needs inpatient treatment to better manage her medication changes. Patient said that she wasn't planning on actively hurting herself, but that she "understood why people jump off of buildings" if they feel the way that she feels right now. MD aware.  CSW to follow.  Laveda Abbe, [place] Clinical Social Worker [phone]

## 2017-11-22 NOTE — Telephone Encounter (Signed)
Patient called from the hospital today. Patient states she got off all medications. Patient states she is having a "hard time with her kidneys". Patient also states she has become forgetful. She would really like to talk to [name] because she feels like he knows her. She is going to call after she gets out of rehab so that she can make an appointment.

## 2017-11-22 NOTE — Discharge Summary (Addendum)
Physician Discharge Summary  Erin Lopez MWU:[id number] DOB: 22-Mar-1951 DOA: 11/18/2017  PCP: Tammi Sou, MD  Admit date: 11/18/2017 Discharge date: 11/22/2017  Admitted From: Home Disposition:  Home  Discharge Condition:Stable CODE STATUS:FULL Diet recommendation: Heart Healthy    Brief/Interim Summary:  Patient is a 67 year old female with past medical history of bipolar disorder, hypertension, COPD was just discharged on 11/18/17 after being managed for severehyponatremia. At home ,patient became very confused and agitated. Had to be brought to the ER. Found to be hemodynamicallystable on presentation. Neurology consulted but have signed off.  MRI done here did not show any acute intracranial abnormalities.  Psychiatry consulted  .Found to have manic symptoms with feeling of euphoria, racing thoughts and pressured speech.  Medications adjusted.   She did not need inpatient psychiatric hospitalization.  Recommended to follow-up with her psychiatrist as an outpatient.  Patient refused referral to any of the psychiatrist and said that she will follow-up with her family physician for the follow-up. She is stable for discharge to skilled nursing facility .  Following problems were addressed during her hospitalization:  Altered mental status:   Her altered mental status is secondary to her psychiatric issues.  She is completely alert and oriented now. MRI of the brain did not show acute intracranial abnormalities.  Neurological exam is unremarkable.  She does not have any evidence of metabolic encephalopathy.  Lab works non impressive.  Psychiatry adjusted the medication and started on Seroquel.  Severe hyponatremia:Resolved.Presented with severe hyponatremia on last admission secondary to increasedintake of free water at home.She has H/O kidney stonesand follows with urology as an outpatient.There could be component of SIADH also. We have advised her not to drink too  much of free water at home and just drink appropriate amount.  COPD: Currently stable.Respiratory status stable. Resume home medications.  Hypertension: On chlorthalidone at home whichwill be held. Will continue lisinopril .Currently blood pressure stable.  Bipolar disorder: On Lamictal and Effexor which have been discontinued.  Started on Seroquel and Klonopin for anxiety.  CKD stage VO:[id number] kidney function stable and is on baseline.  Chronic pain syndrome:On  Oxycodone  UTI: Was taking doxycycline as an outpatient.UA done on admission here was non-impressive for UTI. Antibiotics will be discontinued.  Hypokalemia: Supplemented with potassium.  Deconditioning/debility: PT evaluated and recommended skilled nursing facility on discharge.  Social worker consulted.  Discharge Diagnoses:  Principal Problem:   Bipolar 1 disorder ([place]) Active Problems:   COPD (chronic obstructive pulmonary disease) (HCC)   Hypertension   Acute encephalopathy    Discharge Instructions  Discharge Instructions    Diet - low sodium heart healthy   Complete by:  As directed    Discharge instructions   Complete by:  As directed    1)Take prescribed medications as instructed. 2)Follow up with your PCP in a week. 3)Do CBC and BMP tests in a week. 4)Follow up with your psychiatrist as an outpatient.   Increase activity slowly   Complete by:  As directed      Allergies as of 11/22/2017      Reactions   Ceftin [cefuroxime Axetil] Anaphylaxis   Swelling of eyes and tongue   Ciprofloxacin Anaphylaxis   Difficulty breathing and generalized swelling   Fosamax [alendronate Sodium] Diarrhea   Morphine And Related Other (See Comments)   Hallucinations/ disoriented - pt stated, "Only Morphine" - 03/23/17   Prednisone Other (See Comments)   Hyperactivity   Seroquel [quetiapine] Other (See Comments)   oversedation  Medication List    STOP taking these medications    ALPRAZolam 1 MG tablet Commonly known as:  XANAX   lamoTRIgine 100 MG tablet Commonly known as:  LAMICTAL   venlafaxine XR 150 MG 24 hr capsule Commonly known as:  EFFEXOR-XR   zolpidem 10 MG tablet Commonly known as:  AMBIEN     TAKE these medications   albuterol 108 (90 Base) MCG/ACT inhaler Commonly known as:  PROVENTIL HFA;VENTOLIN HFA INHALE 2 PUFFS INTO THE LUNGS EVERY 4 (FOUR) HOURS AS NEEDED FOR WHEEZING OR SHORTNESS OF BREATH. What changed:    how much to take  how to take this  when to take this  reasons to take this  additional instructions   ANORO ELLIPTA 62.5-25 MCG/INH Aepb Generic drug:  umeclidinium-vilanterol TAKE 1 PUFF BY MOUTH EVERY DAY What changed:  See the new instructions.   atorvastatin 20 MG tablet Commonly known as:  LIPITOR TAKE 1 TABLET (20 MG TOTAL) BY MOUTH DAILY.   carvedilol 25 MG tablet Commonly known as:  COREG TAKE 1/2 TAB BY MOUTH TWICE A DAY What changed:    how much to take  how to take this  when to take this  additional instructions   cetirizine 10 MG tablet Commonly known as:  ZYRTEC Take 10 mg by mouth every morning.   clonazePAM 0.5 MG tablet Commonly known as:  KLONOPIN Take 1 tablet (0.5 mg total) by mouth 2 (two) times daily as needed (anxiety).   cyclobenzaprine 10 MG tablet Commonly known as:  FLEXERIL Take 1 tablet (10 mg total) by mouth 2 (two) times daily as needed for muscle spasms.   fluticasone 50 MCG/ACT nasal spray Commonly known as:  FLONASE SPRAY 2 SPRAYS INTO EACH NOSTRIL EVERY DAY What changed:  See the new instructions.   lisinopril 40 MG tablet Commonly known as:  PRINIVIL,ZESTRIL TAKE 1 TABLET (40 MG TOTAL) BY MOUTH DAILY. What changed:    how much to take  when to take this   montelukast 10 MG tablet Commonly known as:  SINGULAIR TAKE 1 TABLET (10 MG TOTAL) BY MOUTH EVERY EVENING.   Oxycodone HCl 10 MG Tabs Take 1 tablet (10 mg total) by mouth every 6 (six) hours as  needed (severe pain).   polyethylene glycol powder powder Commonly known as:  GLYCOLAX/MIRALAX TAKE 17 G BY MOUTH DAILY.   potassium citrate 10 MEQ (1080 MG) SR tablet Commonly known as:  UROCIT-K Take 20 mEq by mouth 2 (two) times daily.   QUEtiapine 100 MG tablet Commonly known as:  SEROQUEL Take 1 tablet (100 mg total) by mouth at bedtime.   QUEtiapine 50 MG tablet Commonly known as:  SEROQUEL Take 1 tablet (50 mg total) by mouth 2 (two) times daily as needed (anxiety).   ranitidine 150 MG capsule Commonly known as:  ZANTAC Take 150 mg by mouth 2 (two) times daily as needed.   tamsulosin 0.4 MG Caps capsule Commonly known as:  FLOMAX Take 0.4 mg by mouth daily.   valACYclovir 1000 MG tablet Commonly known as:  VALTREX Take 1 tablet (1,000 mg total) by mouth 3 (three) times daily.      Follow-up Information    McGowen, Adrian Blackwater, MD. Schedule an appointment as soon as possible for a visit in 1 week(s).   Specialty:  Family Medicine Contact information: [address] [place]  [address] [phone]          Allergies  Allergen Reactions  . Ceftin [  Cefuroxime Axetil] Anaphylaxis    Swelling of eyes and tongue  . Ciprofloxacin Anaphylaxis    Difficulty breathing and generalized swelling  . Fosamax [Alendronate Sodium] Diarrhea  . Morphine And Related Other (See Comments)    Hallucinations/ disoriented - pt stated, "Only Morphine" - 03/23/17  . Prednisone Other (See Comments)    Hyperactivity  . Seroquel [Quetiapine] Other (See Comments)    oversedation    Consultations:  Psychiatry   Procedures/Studies: Dg Chest 1 View  Result Date: 11/19/2017 CLINICAL DATA:  Chest pain and confusion. Difficulty urinating. Altered mental status. EXAM: CHEST  1 VIEW COMPARISON:  11/16/2017 FINDINGS: The heart size and mediastinal contours are within normal limits. Both lungs are clear. The visualized skeletal structures are unremarkable. IMPRESSION: No active  disease. Electronically Signed   By: Lucienne Capers M.D.   On: 11/19/2017 01:01   Dg Chest 1 View  Result Date: 11/16/2017 CLINICAL DATA:  [place] to the floor.  Diffuse pain. EXAM: CHEST  1 VIEW COMPARISON:  04/25/2017 FINDINGS: The heart size and mediastinal contours are within normal limits. Both lungs are clear. The visualized skeletal structures are unremarkable. IMPRESSION: No active disease. Electronically Signed   By: Lucienne Capers M.D.   On: 11/16/2017 01:29   Dg Pelvis 1-2 Views  Result Date: 11/16/2017 CLINICAL DATA:  Left hip pain after fall to the floor. EXAM: PELVIS - 1-2 VIEW COMPARISON:  CT abdomen and pelvis 07/28/2016 FINDINGS: There is no evidence of pelvic fracture or diastasis. No pelvic bone lesions are seen. IMPRESSION: Negative. Electronically Signed   By: Lucienne Capers M.D.   On: 11/16/2017 01:29   Dg Ankle Complete Left  Result Date: 11/16/2017 CLINICAL DATA:  Left ankle pain after a fall. EXAM: LEFT ANKLE COMPLETE - 3+ VIEW COMPARISON:  Left foot 08/02/2017 FINDINGS: No evidence of acute fracture or dislocation of the left ankle. No focal bone lesion or bone destruction. Bone cortex appears intact. Degenerative changes in the intertarsal joints. Postoperative changes incidentally noted in the foot. Soft tissues are unremarkable. IMPRESSION: No acute bony abnormalities.  Degenerative changes. Electronically Signed   By: Lucienne Capers M.D.   On: 11/16/2017 01:32   Ct Head Wo Contrast  Result Date: 11/16/2017 CLINICAL DATA:  Posttraumatic headache. EXAM: CT HEAD WITHOUT CONTRAST CT CERVICAL SPINE WITHOUT CONTRAST TECHNIQUE: Multidetector CT imaging of the head and cervical spine was performed following the standard protocol without intravenous contrast. Multiplanar CT image reconstructions of the cervical spine were also generated. COMPARISON:  MRI brain 02/04/2014. CT head 05/16/2013. Cervical spine radiographs 11/25/2009 FINDINGS: CT HEAD FINDINGS Brain: No evidence  of acute infarction, hemorrhage, hydrocephalus, extra-axial collection or mass lesion/mass effect. Mild cerebral atrophy. Mild ventricular dilatation consistent with central atrophy. Vascular: Mild intracranial arterial vascular calcifications. Skull: Calvarium appears intact. Sinuses/Orbits: Paranasal sinuses and mastoid air cells are clear. Other: None. CT CERVICAL SPINE FINDINGS Alignment: Slight anterior subluxation of C7 on T1, likely postoperative. This is unchanged since previous study. Normal alignment of the facet joints. C1-2 articulation appears intact. Skull base and vertebrae: Skull base appears intact. No vertebral compression deformities. No focal bone lesion or bone destruction. Soft tissues and spinal canal: No prevertebral soft tissue swelling. No paraspinal soft tissue mass or infiltration. Disc levels: Postoperative changes with anterior plate and screw fixation from C3-C5 and with intervertebral disc fusion from C3 through C7. Fused segments appear intact. Fusion hardware appears intact. Laminectomies at C6 and C7. Degenerative changes at C7-T1 with narrowed disc space and hypertrophic change.  Upper chest: Emphysematous changes in the lung apices. Asymmetric hypoenhancing thyroid nodule on the right measuring up to 10 mm diameter. Based on size, no further evaluation is indicated at this time. Other: None. IMPRESSION: 1. No acute intracranial abnormalities.  Cortical atrophy. 2. Postoperative changes in the cervical spine. Unchanged alignment since previous study. No acute displaced fractures identified. Electronically Signed   By: Lucienne Capers M.D.   On: 11/16/2017 02:03   Ct Cervical Spine Wo Contrast  Result Date: 11/16/2017 CLINICAL DATA:  Posttraumatic headache. EXAM: CT HEAD WITHOUT CONTRAST CT CERVICAL SPINE WITHOUT CONTRAST TECHNIQUE: Multidetector CT imaging of the head and cervical spine was performed following the standard protocol without intravenous contrast. Multiplanar CT  image reconstructions of the cervical spine were also generated. COMPARISON:  MRI brain 02/04/2014. CT head 05/16/2013. Cervical spine radiographs 11/25/2009 FINDINGS: CT HEAD FINDINGS Brain: No evidence of acute infarction, hemorrhage, hydrocephalus, extra-axial collection or mass lesion/mass effect. Mild cerebral atrophy. Mild ventricular dilatation consistent with central atrophy. Vascular: Mild intracranial arterial vascular calcifications. Skull: Calvarium appears intact. Sinuses/Orbits: Paranasal sinuses and mastoid air cells are clear. Other: None. CT CERVICAL SPINE FINDINGS Alignment: Slight anterior subluxation of C7 on T1, likely postoperative. This is unchanged since previous study. Normal alignment of the facet joints. C1-2 articulation appears intact. Skull base and vertebrae: Skull base appears intact. No vertebral compression deformities. No focal bone lesion or bone destruction. Soft tissues and spinal canal: No prevertebral soft tissue swelling. No paraspinal soft tissue mass or infiltration. Disc levels: Postoperative changes with anterior plate and screw fixation from C3-C5 and with intervertebral disc fusion from C3 through C7. Fused segments appear intact. Fusion hardware appears intact. Laminectomies at C6 and C7. Degenerative changes at C7-T1 with narrowed disc space and hypertrophic change. Upper chest: Emphysematous changes in the lung apices. Asymmetric hypoenhancing thyroid nodule on the right measuring up to 10 mm diameter. Based on size, no further evaluation is indicated at this time. Other: None. IMPRESSION: 1. No acute intracranial abnormalities.  Cortical atrophy. 2. Postoperative changes in the cervical spine. Unchanged alignment since previous study. No acute displaced fractures identified. Electronically Signed   By: Lucienne Capers M.D.   On: 11/16/2017 02:03   Mr Brain Wo Contrast  Result Date: 11/20/2017 CLINICAL DATA:  Altered mental status.  Hyponatremia. EXAM: MRI HEAD  WITHOUT CONTRAST TECHNIQUE: Multiplanar, multiecho pulse sequences of the brain and surrounding structures were obtained without intravenous contrast. COMPARISON:  Head CT 11/16/2017 and MRI 02/04/2014 FINDINGS: Multiple sequences are mildly to moderately motion degraded. Brain: There is no evidence of acute infarct, intracranial hemorrhage, mass, midline shift, or extra-axial fluid collection. There is mild cerebral atrophy. Scattered cerebral white matter T2 hyperintensities are unchanged from the prior MRI and advanced for age. There is normal signal in the brainstem. Vascular: Major intracranial vascular flow voids are preserved. Skull and upper cervical spine: Unremarkable bone marrow signal. Sinuses/Orbits: Unremarkable orbits. Paranasal sinuses and mastoid air cells are clear. Other: None. IMPRESSION: 1. No acute intracranial abnormality. 2. Mildly age advanced cerebral white matter disease, unchanged from 2015 and nonspecific. Considerations include chronic small vessel ischemia, vasculitis, migraines, prior infection/inflammation, and demyelinating disease. Electronically Signed   By: Logan Bores M.D.   On: 11/20/2017 10:52   Dg Humerus Left  Result Date: 11/16/2017 CLINICAL DATA:  Left shoulder pain after a fall. EXAM: LEFT HUMERUS - 2+ VIEW COMPARISON:  None. FINDINGS: There is no evidence of fracture or other focal bone lesions. Soft tissues are unremarkable. IMPRESSION: Negative.  Electronically Signed   By: Lucienne Capers M.D.   On: 11/16/2017 01:33   Dg Foot Complete Left  Result Date: 11/16/2017 CLINICAL DATA:  Left foot pain after a fall to floor. EXAM: LEFT FOOT - COMPLETE 3+ VIEW COMPARISON:  08/02/2017 FINDINGS: Postoperative changes with prior left hallux valgus repair and silicon type implants in the second metatarsal phalangeal joint. Old resection or resorption of the distal aspect of the middle phalanx of the left third toe. No change in appearance since the previous study. No  evidence of acute fracture or dislocation. No focal bone lesion or bone destruction. Soft tissues are unremarkable. IMPRESSION: No acute bony abnormalities.  Postoperative changes as described. Electronically Signed   By: Lucienne Capers M.D.   On: 11/16/2017 01:31       Subjective: Patient seen and examined the bedside this morning.  Hemodynamically stable.  Alert and oriented.  Stable for discharge.  Discharge Exam: Vitals:   11/22/17 0813 11/22/17 0819  BP:  (!) 130/98  Pulse:  (!) 101  Resp:  18  Temp:    SpO2: 96% 98%   Vitals:   11/22/17 0110 11/22/17 0322 11/22/17 0813 11/22/17 0819  BP:  (!) 97/56  (!) 130/98  Pulse: (!) 104 98  (!) 101  Resp:  19  18  Temp: 97.9 F (36.6 C) 98.1 F (36.7 C)    TempSrc: Oral Oral    SpO2: 96% 96% 96% 98%  Weight:      Height:        General: Pt is alert, awake, not in acute distress Cardiovascular: RRR, S1/S2 +, no rubs, no gallops Respiratory: CTA bilaterally, no wheezing, no rhonchi Abdominal: Soft, NT, ND, bowel sounds + Extremities: no edema, no cyanosis    The results of significant diagnostics from this hospitalization (including imaging, microbiology, ancillary and laboratory) are listed below for reference.     Microbiology: Recent Results (from the past 240 hour(s))  Urine culture     Status: None   Collection Time: 11/19/17 12:24 AM  Result Value Ref Range Status   Specimen Description URINE, CATHETERIZED  Final   Special Requests NONE  Final   Culture   Final    NO GROWTH Performed at [facility], [address], [place], [place] [address]    Report Status 11/20/2017 FINAL  Final     Labs: BNP (last 3 results) No results for input(s): BNP in the last 8760 hours. Basic Metabolic Panel: Recent Labs  Lab 11/16/17 1032 11/18/17 0447 11/19/17 0039 11/19/17 0053 11/20/17 1203 11/22/17 0538  NA 125* 133* 134* 132* 136 140  K 3.6 4.5 3.8 3.7 3.3* 3.9  CL 88* 98 97* 95* 100 104  CO2 28 29 27    --  25 29  GLUCOSE 85 97 121* 118* 164* 107*  BUN 13 7* 6* 7* 15 13  CREATININE 1.04* 0.86 0.82 0.80 0.85 0.92  CALCIUM 9.3 9.1 10.3  --  9.7 9.3   Liver Function Tests: Recent Labs  Lab 11/19/17 0039  AST 19  ALT 18  ALKPHOS 51  BILITOT 0.9  PROT 6.8  ALBUMIN 4.3   No results for input(s): LIPASE, AMYLASE in the last 168 hours. Recent Labs  Lab 11/19/17 0039  AMMONIA <9*   CBC: Recent Labs  Lab 11/16/17 0032 11/16/17 0623 11/19/17 0039 11/19/17 0053 11/20/17 1203  WBC 6.3 5.0 9.1  --  8.2  NEUTROABS  --   --  7.6  --  6.8  HGB 9.9* 9.2* 11.5* 10.9* 11.6*  HCT 28.2* 25.7* 33.6* 32.0* 33.7*  MCV 86.8 86.5 89.8  --  88.2  PLT 204 175 240  --  325   Cardiac Enzymes: Recent Labs  Lab 11/16/17 0032  TROPONINI <0.03   BNP: Invalid input(s): POCBNP CBG: Recent Labs  Lab 11/16/17 0039 11/18/17 2306  GLUCAP 104* 71   D-Dimer No results for input(s): DDIMER in the last 72 hours. Hgb A1c No results for input(s): HGBA1C in the last 72 hours. Lipid Profile No results for input(s): CHOL, HDL, LDLCALC, TRIG, CHOLHDL, LDLDIRECT in the last 72 hours. Thyroid function studies No results for input(s): TSH, T4TOTAL, T3FREE, THYROIDAB in the last 72 hours.  Invalid input(s): FREET3 Anemia work up No results for input(s): VITAMINB12, FOLATE, FERRITIN, TIBC, IRON, RETICCTPCT in the last 72 hours. Urinalysis    Component Value Date/Time   COLORURINE STRAW (A) 11/19/2017 0039   APPEARANCEUR CLEAR 11/19/2017 0039   LABSPEC 1.006 11/19/2017 0039   PHURINE 8.0 11/19/2017 0039   GLUCOSEU NEGATIVE 11/19/2017 0039   GLUCOSEU NEGATIVE 05/29/2013 1156   HGBUR SMALL (A) 11/19/2017 0039   BILIRUBINUR NEGATIVE 11/19/2017 0039   BILIRUBINUR neg 06/05/2015 1510   KETONESUR 20 (A) 11/19/2017 0039   PROTEINUR NEGATIVE 11/19/2017 0039   UROBILINOGEN 0.2 06/05/2015 1510   UROBILINOGEN 0.2 08/10/2013 1611   NITRITE NEGATIVE 11/19/2017 0039   LEUKOCYTESUR NEGATIVE 11/19/2017  0039   Sepsis Labs Invalid input(s): PROCALCITONIN,  WBC,  LACTICIDVEN Microbiology Recent Results (from the past 240 hour(s))  Urine culture     Status: None   Collection Time: 11/19/17 12:24 AM  Result Value Ref Range Status   Specimen Description URINE, CATHETERIZED  Final   Special Requests NONE  Final   Culture   Final    NO GROWTH Performed at [facility], [place] [address], [place], [place] [address]    Report Status 11/20/2017 FINAL  Final    Please note: You were cared for by a hospitalist during your hospital stay. Once you are discharged, your primary care physician will handle any further medical issues. Please note that NO REFILLS for any discharge medications will be authorized once you are discharged, as it is imperative that you return to your primary care physician (or establish a relationship with a primary care physician if you do not have one) for your post hospital discharge needs so that they can reassess your need for medications and monitor your lab values.    Time coordinating discharge: 40 minutes  SIGNED:   Shelly Coss, MD  [facility] 11/22/2017, 11:19 AM Pager [phone]  If 7PM-7AM, please contact night-coverage [email or web address] Password [email or web address]

## 2017-11-23 DIAGNOSIS — G9340 Encephalopathy, unspecified: Secondary | ICD-10-CM | POA: Diagnosis not present

## 2017-11-23 NOTE — Progress Notes (Signed)
Notified patient by telephone that prescriptions still at hospital ready for pick up. Patient states she will have someone come to retrieve if possible. Wendee Copp

## 2017-11-23 NOTE — Progress Notes (Signed)
CSW met with patient to discuss discharge plan. Patient has chosen [facility], they have initiated Ship broker request. Patient says she has a ride that is coming for her at 11:30 and she is not waiting around anymore for the doctors here at the hospital who don't care about her. Patient expressed frustrations about not being taken seriously and not being given respect and that she doesn't want to be here any longer than she has to at this point. Patient continues to demonstrate rapid, pressured speech, is tangential in her conversation, and expresses feeling "crazy". She is laughing inappropriately and started to sing a song to [place] during conversation.   CSW alerted RN that patient's ride would be here shortly, and alerted [employer] [employer] that patient would be waiting for authorization at home. CSW will call patient when authorization received.  Laveda Abbe LCSW [phone]

## 2017-11-23 NOTE — Progress Notes (Signed)
Physical Therapy Treatment Patient Details Name: Erin Lopez MRN: [id number] DOB: 03/30/1951 Today's Date: 11/23/2017    History of Present Illness Pt is a 67 y.o. F with significant PMH of bipolar disorder, hypertension, COPD, chronic pain who was discharged due to hyponatremia 8/17; and readmitted 8/17 due to worsening confusion.     PT Comments    Patient progressing slowly towards PT goals. Seems more alert today with reports of feeling "out of her head the last few days," however continues to confabulate throughout session. Also with tangential speech unrelated to questions asked. Tolerated gait training without DME today but requires close min guard due to instability, imbalance and SOB. Pt with 2-3/4 DOE and needing to grab rail for support. 1 standing rest break and seated rest break. Able to be easily redirected. Continue to recommend SNF as due to cognitive status, pt not safe to be home alone. Will continue to follow.   Follow Up Recommendations  SNF;Supervision/Assistance - 24 hour     Equipment Recommendations  None recommended by PT    Recommendations for Other Services       Precautions / Restrictions Precautions Precautions: Fall Restrictions Weight Bearing Restrictions: No    Mobility  Bed Mobility Overal bed mobility: Needs Assistance Bed Mobility: Supine to Sit     Supine to sit: Modified independent (Device/Increase time)     General bed mobility comments: No assist needed, HOB elevated.   Transfers Overall transfer level: Needs assistance Equipment used: None Transfers: Sit to/from Stand Sit to Stand: Supervision         General transfer comment: for safety due to unpredictability. Stood from [employer].   Ambulation/Gait Ambulation/Gait assistance: Min guard Gait Distance (Feet): 100 Feet(+40') Assistive device: None Gait Pattern/deviations: Step-through pattern;Decreased stride length;Trunk flexed;Drifts right/left Gait velocity:  slow   General Gait Details: Slow, unsteady gait with bil knee instability. Drifting to right/left and reaching for rail at times. 2-3/4 DOE with difficulty walking and talking. HR up to 138 bpm. Seated rest break.    Stairs             Wheelchair Mobility    Modified Rankin (Stroke Patients Only)       Balance Overall balance assessment: Needs assistance Sitting-balance support: Feet supported;No upper extremity supported Sitting balance-Leahy Scale: Good Sitting balance - Comments: Able to donn/doff sock reaching wihtout difficulty.    Standing balance support: During functional activity Standing balance-Leahy Scale: Fair Standing balance comment: Able to stand and reach for items on tray without LOB but slightly unsteady when fatigued.                             Cognition Arousal/Alertness: Awake/alert Behavior During Therapy: WFL for tasks assessed/performed Overall Cognitive Status: No family/caregiver present to determine baseline cognitive functioning Area of Impairment: Orientation;Safety/judgement;Awareness;Memory;Attention;Following commands                 Orientation Level: Disoriented to;Situation Current Attention Level: Selective Memory: Decreased short-term memory Following Commands: Follows multi-step commands consistently Safety/Judgement: Decreased awareness of safety     General Comments: A&Ox3, confused about situation that brought her to the hospital. Aware of "being out of my head the last few days." Continues to confabulate with tangential speech. "I was ready to streak out of here yesterday." "I'm glad there were not 62 year olds out there or I'd be in trouble, hot damn."      Exercises  General Comments        Pertinent Vitals/Pain Pain Assessment: No/denies pain    Home Living                      Prior Function            PT Goals (current goals can now be found in the care plan section)  Progress towards PT goals: Progressing toward goals    Frequency    Min 3X/week      PT Plan Current plan remains appropriate    Co-evaluation              AM-PAC PT "6 Clicks" Daily Activity  Outcome Measure  Difficulty turning over in bed (including adjusting bedclothes, sheets and blankets)?: None Difficulty moving from lying on back to sitting on the side of the bed? : None Difficulty sitting down on and standing up from a chair with arms (e.g., wheelchair, bedside commode, etc,.)?: A Little Help needed moving to and from a bed to chair (including a wheelchair)?: A Little Help needed walking in hospital room?: A Little Help needed climbing 3-5 steps with a railing? : A Little 6 Click Score: 20    End of Session Equipment Utilized During Treatment: Gait belt Activity Tolerance: Patient tolerated treatment well Patient left: Other (comment)(left in hallway with OT) Nurse Communication: Mobility status PT Visit Diagnosis: Muscle weakness (generalized) (M62.81);Difficulty in walking, not elsewhere classified (R26.2)     Time: 0211-1735 PT Time Calculation (min) (ACUTE ONLY): 25 min  Charges:  $Gait Training: 8-22 mins $Therapeutic Activity: 8-22 mins                     [place], [place], [place] [phone]     Lacie Draft 11/23/2017, 9:43 AM

## 2017-11-23 NOTE — Progress Notes (Signed)
Patient discharged to home via family car.  She is wanting to wait there for insurance approval to go to skilled nursing facility. IV removed. Discharge instructions given. Erin Lopez

## 2017-11-23 NOTE — Telephone Encounter (Signed)
Pt called requesting to speak with [name] personally. Pt stated that Dr Anitra Lauth had spoke with her during an office visit about changing the dosage of her medication (stated she was unsure which but then said she thinks was QUEtiapine (SEROQUEL) 100 MG tablet). Pt stated that she feels like she is about to "lose it" and is requesting a call back from a nurse or Dr Anitra Lauth. Offered to transfer pt to a triage nurse but pt was at another appointment and had to get off the phone. Please advise.

## 2017-11-23 NOTE — Progress Notes (Signed)
Discharge to: [facility] Anticipated discharge date: 11/23/17 Transportation by: Family car  Report #: [id number]  CSW signing off.  Laveda Abbe LCSW [phone]

## 2017-11-23 NOTE — Progress Notes (Signed)
PROGRESS NOTE    Erin Lopez  GQQ:[id number] DOB: 15-Feb-1951 DOA: 11/18/2017 PCP: Tammi Sou, MD   Brief Narrative: Patient is a 67 year old female with past medical history of bipolar disorder, hypertension, COPD was just discharged on 11/18/17 after being managed for severehyponatremia. At home ,patient became very confused and agitated. Had to be brought to the ER. Found to be hemodynamicallystable on presentation. Neurology consultedbut have signed off. MRI done heredid not show any acute intracranial abnormalities. Psychiatry consulted .Found to have manic symptoms with feeling of euphoria, racing thoughts and pressured speech.  Medications adjusted.  She did not need inpatient psychiatric hospitalization.  Recommended to follow-up with her psychiatrist as an outpatient.  Patient refused referral to any of the psychiatrist and said that she will follow-up with her family physician for the follow-up.She is stable for discharge to skilled nursing facility .  We cudnt discharge her yesterday because she wants to be evaluated by psychiatrist.  I have discussed with Dr. Mariea Clonts, psychiatry, today who will see the patient later.  She might be discharged later this afternoon.  Assessment & Plan:   Principal Problem:   Bipolar 1 disorder ([place]) Active Problems:   COPD (chronic obstructive pulmonary disease) (HCC)   Hypertension   Acute encephalopathy   Altered mental status:Her altered mental status is secondary to her psychiatric issues.She is  alert and oriented now.MRI of the brain did not show acute intracranial abnormalities. Neurological exam is unremarkable. She does not have any evidence of metabolic encephalopathy. Lab works non impressive. Psychiatry adjusted the medication and started on Seroquel.  Severe hyponatremia:Resolved.Presented with severe hyponatremia on last admission secondary to increasedintake of free water at home.She has H/O kidney  stonesand follows with urology as an outpatient.There could be component of SIADH also. We have advised her not to drink too much of free water at home and just drink appropriate amount.  COPD: Currently stable.Respiratory status stable. Resume home medications.  Hypertension: On chlorthalidone at home whichwill be held. Will continue lisinopril .Currently blood pressure stable.  Bipolar disorder: On Lamictal and Effexorwhich havebeen discontinued.Started on Seroquel and Klonopin for anxiety.  CKD stage IZ:[id number] kidney function stable and is on baseline.  Chronic pain syndrome:On  Oxycodone  UTI: Was taking doxycycline as an outpatient.UA done on admission here was non-impressive for UTI. Antibiotics will be discontinued.  Hypokalemia:Supplemented with potassium.  Deconditioning/debility: PT evaluated and recommended skilled nursing facility on discharge. Social worker consulted.   DVT prophylaxis:SCD Code Status: Full Family Communication: None present at the bedside Disposition Plan: SNF today for psychiatry evaluation   Consultants: Psychiatry  Procedures: EEG, MRI  Antimicrobials: None  Subjective: Patient seen and examined the bedside this morning.  Says she wants to see psychiatry.  She understands the decision to be discharged today.  She was concerned that the psych meds she was put on may not have worked effectively.  Objective: Vitals:   11/23/17 0014 11/23/17 0338 11/23/17 0750 11/23/17 0819  BP: 118/65 122/64  (!) 173/71  Pulse: 97 (!) 101  (!) 115  Resp: 18 20  20   Temp: 98.6 F (37 C) 98.6 F (37 C)  98.2 F (36.8 C)  TempSrc: Oral Oral  Oral  SpO2: 97% 94% 96% 100%  Weight:      Height:        Intake/Output Summary (Last 24 hours) at 11/23/2017 1013 Last data filed at 11/23/2017 0539 Gross per 24 hour  Intake 600 ml  Output -  Net 600 ml  Filed Weights   11/19/17 0416  Weight: 71.7 kg     Examination:  General exam: Appears anxious and tremulous,Not in distress,average built HEENT:PERRL,Oral mucosa moist, Ear/Nose normal on gross exam Respiratory system: Bilateral equal air entry, normal vesicular breath sounds, no wheezes or crackles  Cardiovascular system: S1 & S2 heard, RRR. No JVD, murmurs, rubs, gallops or clicks. No pedal edema. Gastrointestinal system: Abdomen is nondistended, soft and nontender. No organomegaly or masses felt. Normal bowel sounds heard. Central nervous system: Alert and oriented. No focal neurological deficits. Extremities: No edema, no clubbing ,no cyanosis, distal peripheral pulses palpable. Skin: No rashes, lesions or ulcers,no icterus ,no pallor MSK: Normal muscle bulk,tone ,power     Data Reviewed: I have personally reviewed following labs and imaging studies  CBC: Recent Labs  Lab 11/19/17 0039 11/19/17 0053 11/20/17 1203  WBC 9.1  --  8.2  NEUTROABS 7.6  --  6.8  HGB 11.5* 10.9* 11.6*  HCT 33.6* 32.0* 33.7*  MCV 89.8  --  88.2  PLT 240  --  678   Basic Metabolic Panel: Recent Labs  Lab 11/16/17 1032 11/18/17 0447 11/19/17 0039 11/19/17 0053 11/20/17 1203 11/22/17 0538  NA 125* 133* 134* 132* 136 140  K 3.6 4.5 3.8 3.7 3.3* 3.9  CL 88* 98 97* 95* 100 104  CO2 28 29 27   --  25 29  GLUCOSE 85 97 121* 118* 164* 107*  BUN 13 7* 6* 7* 15 13  CREATININE 1.04* 0.86 0.82 0.80 0.85 0.92  CALCIUM 9.3 9.1 10.3  --  9.7 9.3   GFR: Estimated Creatinine Clearance: 62.3 mL/min (by C-G formula based on SCr of 0.92 mg/dL). Liver Function Tests: Recent Labs  Lab 11/19/17 0039  AST 19  ALT 18  ALKPHOS 51  BILITOT 0.9  PROT 6.8  ALBUMIN 4.3   No results for input(s): LIPASE, AMYLASE in the last 168 hours. Recent Labs  Lab 11/19/17 0039  AMMONIA <9*   Coagulation Profile: No results for input(s): INR, PROTIME in the last 168 hours. Cardiac Enzymes: No results for input(s): CKTOTAL, CKMB, CKMBINDEX, TROPONINI in the  last 168 hours. BNP (last 3 results) No results for input(s): PROBNP in the last 8760 hours. HbA1C: No results for input(s): HGBA1C in the last 72 hours. CBG: Recent Labs  Lab 11/18/17 2306  GLUCAP 71   Lipid Profile: No results for input(s): CHOL, HDL, LDLCALC, TRIG, CHOLHDL, LDLDIRECT in the last 72 hours. Thyroid Function Tests: No results for input(s): TSH, T4TOTAL, FREET4, T3FREE, THYROIDAB in the last 72 hours. Anemia Panel: No results for input(s): VITAMINB12, FOLATE, FERRITIN, TIBC, IRON, RETICCTPCT in the last 72 hours. Sepsis Labs: Recent Labs  Lab 11/19/17 0054  LATICACIDVEN 1.09    Recent Results (from the past 240 hour(s))  Urine culture     Status: None   Collection Time: 11/19/17 12:24 AM  Result Value Ref Range Status   Specimen Description URINE, CATHETERIZED  Final   Special Requests NONE  Final   Culture   Final    NO GROWTH Performed at [facility], [address] [place], [place] [address]    Report Status 11/20/2017 FINAL  Final         Radiology Studies: No results found.      Scheduled Meds: . atorvastatin  20 mg Oral Daily  . carvedilol  12.5 mg Oral BID WC  . famotidine  20 mg Oral BID  . fluticasone  2 spray Each Nare Daily  .  lisinopril  20 mg Oral BID  . montelukast  10 mg Oral QPM  . potassium chloride  40 mEq Oral Once  . potassium citrate  20 mEq Oral BID  . QUEtiapine  100 mg Oral QHS  . tamsulosin  0.4 mg Oral Daily  . umeclidinium-vilanterol  1 puff Inhalation Daily   Continuous Infusions:   LOS: 0 days    Time spent: 25 mins.More than 50% of that time was spent in counseling and/or coordination of care.      Shelly Coss, MD [facility] Pager [phone]  If 7PM-7AM, please contact night-coverage [email or web address] Password [facility] 11/23/2017, 10:13 AM

## 2017-11-23 NOTE — Progress Notes (Addendum)
Occupational Therapy Treatment Patient Details Name: Erin Lopez MRN: [id number] DOB: [birth year] Today's Date: 11/23/2017    History of present illness Pt is a 67 y.o. F with significant PMH of bipolar disorder, hypertension, COPD, chronic pain who was discharged due to hyponatremia 8/17; and readmitted 8/17 due to worsening confusion.    OT comments  Pt progressing towards OT goals. Pt demonstrating functional mobility without AD this session in room and hallway with overall minA. Pt continues to demonstrate cognitive impairments including tangential speech, requiring redirection during session as well as cues for safety. Feel POC remains appropriate at this time due to pt's current cognitive deficits and level of assist; pt aware of discharge plan and in agreement. Will continue to follow while she remains in acute setting to progress pt towards established OT goals.     Follow Up Recommendations  SNF    Equipment Recommendations  3 in 1 bedside commode          Precautions / Restrictions Precautions Precautions: Fall Restrictions Weight Bearing Restrictions: No       Mobility Bed Mobility Overal bed mobility: Needs Assistance Bed Mobility: Supine to Sit     Supine to sit: Modified independent (Device/Increase time)     General bed mobility comments: OOB with PT   Transfers Overall transfer level: Needs assistance Equipment used: None Transfers: Sit to/from Stand Sit to Stand: Supervision         General transfer comment: for safety due to unpredictability     Balance Overall balance assessment: Needs assistance Sitting-balance support: Feet supported;No upper extremity supported Sitting balance-Leahy Scale: Good Sitting balance - Comments: Able to donn/doff sock reaching wihtout difficulty.    Standing balance support: During functional activity Standing balance-Leahy Scale: Fair Standing balance comment: Able to stand and reach for items on tray  without LOB but slightly unsteady when fatigued.                            ADL either performed or assessed with clinical judgement   ADL Overall ADL's : Needs assistance/impaired                                     Functional mobility during ADLs: Minimal assistance General ADL Comments: pt requiring minA for hallway mobility without RW this session; initially fixated on locating CSW office and requires redirection to return to room, pt somewhat upset as a result, use of therapeutic listening and therapeutic use of self to listen to pt's concerns prior to exiting room and pt in good spirits by end of session     Vision   Additional Comments: pt ambulating without glasses this session with mild unsteadiness requiring minA, use of glasses while reading during session    Perception     Praxis      Cognition Arousal/Alertness: Awake/alert Behavior During Therapy: WFL for tasks assessed/performed Overall Cognitive Status: No family/caregiver present to determine baseline cognitive functioning Area of Impairment: Orientation;Safety/judgement;Awareness;Memory;Attention;Following commands                 Orientation Level: Disoriented to;Situation Current Attention Level: Selective Memory: Decreased short-term memory Following Commands: Follows multi-step commands consistently;Follows multi-step commands with increased time Safety/Judgement: Decreased awareness of safety Awareness: Emergent   General Comments: pt continues to demonstrate tangential speech, requires redirection to participate in this session as pt fixated on locating  CSW office and upset with therapist's attempts to redirect. In better spirits by end of session after return to room and given therapeutic use of self/therapeutic listening.         Exercises     Shoulder Instructions       General Comments      Pertinent Vitals/ Pain       Pain Assessment: No/denies pain  Home  Living                                          Prior Functioning/Environment              Frequency  Min 2X/week        Progress Toward Goals  OT Goals(current goals can now be found in the care plan section)  Progress towards OT goals: Progressing toward goals  Acute Rehab OT Goals Patient Stated Goal: ready to go to SNF today  OT Goal Formulation: With patient Potential to Achieve Goals: Good  Plan Discharge plan remains appropriate    Co-evaluation                 AM-PAC PT "6 Clicks" Daily Activity     Outcome Measure   Help from another person eating meals?: A Little Help from another person taking care of personal grooming?: A Little Help from another person toileting, which includes using toliet, bedpan, or urinal?: A Little Help from another person bathing (including washing, rinsing, drying)?: A Little Help from another person to put on and taking off regular upper body clothing?: A Little Help from another person to put on and taking off regular lower body clothing?: A Little 6 Click Score: 18    End of Session Equipment Utilized During Treatment: Gait belt  OT Visit Diagnosis: Unsteadiness on feet (R26.81);Repeated falls (R29.6);Muscle weakness (generalized) (M62.81)   Activity Tolerance Patient tolerated treatment well   Patient Left with call bell/phone within reach;with nursing/sitter in room;Other (comment)(sitting EOB )   Nurse Communication Mobility status        Time: 9924-2683 OT Time Calculation (min): 18 min  Charges: OT General Charges $OT Visit: 1 Visit OT Treatments $Self Care/Home Management : 8-22 mins  [name], OT Pager [phone] 11/23/2017    [name] 11/23/2017, 9:53 AM

## 2017-11-23 NOTE — Telephone Encounter (Signed)
Pt advised and voiced understanding.   Pt seemed calm and collected when talking on the phone. She did not have any further questions or concerns she just wanted to make sure [name] was aware of all the changes that have been made. She stated that she will be going to [facility] rehab for 1 week.

## 2017-11-24 ENCOUNTER — Encounter: Payer: Self-pay | Admitting: Internal Medicine

## 2017-11-24 ENCOUNTER — Ambulatory Visit: Payer: Self-pay | Admitting: *Deleted

## 2017-11-24 ENCOUNTER — Telehealth: Payer: Self-pay | Admitting: Family Medicine

## 2017-11-24 NOTE — Telephone Encounter (Signed)
Pt calling stating that she had a "black ashy" looking stool today prior to calling the office. Pt states that it was only "one little piece, about as big as a thumb". Pt denies any abdominal or dizziness this time. Pt states she was recently discharged from the hospital on yesterday. Pt states she was on coumadin prior to her hospital stay and had Lovenox injections while in the hospital but has not had an injection in approximately 2 days. Protocol states that the pt should be seen in the ED, but FC contacted to get the recommendation of PCP with current symptoms.  Spoke with Lattie Haw, nurse at the office who states that [name] recommends that since the pt had only one dark stool that the pt could be reassured and that it was ok to be seen at the appt scheduled on Wed, but if symptoms became worse and had another black colored stool to go to the ED. Information relayed to pt per recommendations of Dr. Ernestine Conrad and pt verbalized understanding. Pt also stated that she had been eating red jello. Pt advised not to eat anything red in color at this time so that it  could be determined if stool color was coming from rectal bleeding or food. Pt verbalized understanding.  Reason for Disposition . Tarry or jet black-colored stool (not dark green)  Answer Assessment - Initial Assessment Questions 1. APPEARANCE of BLOOD: "What color is it?" "Is it passed separately, on the surface of the stool, or mixed in with the stool?"     "black ashy" about the size of thumb 2. AMOUNT: "How much blood was passed?"      Small BM about the size of thumb 3. FREQUENCY: "How many times has blood been passed with the stools?"      Just once 4. ONSET: "When was the blood first seen in the stools?" (Days or weeks)      Just once 5. DIARRHEA: "Is there also some diarrhea?" If so, ask: "How many diarrhea stools were passed in past 24 hours?"      Loose stools since being in hospital, pt was discharged from the hospital on  yesterday 6. CONSTIPATION: "Do you have constipation?" If so, "How bad is it?"     no 7. RECURRENT SYMPTOMS: "Have you had blood in your stools before?" If so, ask: "When was the last time?" and "What happened that time?"      No 8. BLOOD THINNERS: "Do you take any blood thinners?" (e.g., Coumadin/warfarin, Pradaxa/dabigatran, aspirin)     Was but off coumadin but off now but has not had Lovenox injection in like 2 days 9. OTHER SYMPTOMS: "Do you have any other symptoms?"  (e.g., abdominal pain, vomiting, dizziness, fever)     No 10. PREGNANCY: "Is there any chance you are pregnant?" "When was your last menstrual period?"       No  Protocols used: RECTAL BLEEDING-A-AH

## 2017-11-24 NOTE — Progress Notes (Signed)
Opened in error

## 2017-11-24 NOTE — Telephone Encounter (Signed)
Noted: nurse phone contact with patient for TCM. Signed:  Crissie Sickles, MD           11/24/2017

## 2017-11-24 NOTE — Telephone Encounter (Signed)
Transition Care Management Follow-up Telephone Call   Date discharged? 11/22/17   How have you been since you were released from the hospital? Patient feels better than she felt when she went in hospital but still not good.    Do you understand why you were in the hospital? yes   Do you understand the discharge instructions? yes   Where were you discharged to? Home   Items Reviewed:  Medications reviewed: yes, stopped effexor- added seroquel 150mg  daily  Allergies reviewed: no  Dietary changes reviewed: yes- low sodium, healthy heart  Referrals reviewed: no   Functional Questionnaire:   Activities of Daily Living (ADLs):   She states they are independent in the following: patient okay to do self care- but having trouble getting around due to pain in her foot States they require assistance with the following: house work    Any transportation issues/concerns?: yes   Any patient concerns? Yes, patient has pain in her toe and feels like it's broken- patient states that nobody acknowledge her toe pain but patient has podiatrist Dr Mariea Clonts   Confirmed importance and date/time of follow-up visits scheduled yes  Provider Appointment booked with 11/29/17 @ 11am  Confirmed with patient if condition begins to worsen call PCP or go to the ER.  Patient was given the office number and encouraged to call back with question or concerns.  : yes

## 2017-11-27 ENCOUNTER — Encounter: Payer: Self-pay | Admitting: Podiatry

## 2017-11-27 ENCOUNTER — Ambulatory Visit: Payer: Medicare Other | Admitting: Podiatry

## 2017-11-27 ENCOUNTER — Ambulatory Visit (INDEPENDENT_AMBULATORY_CARE_PROVIDER_SITE_OTHER): Payer: Medicare Other

## 2017-11-27 DIAGNOSIS — S92502D Displaced unspecified fracture of left lesser toe(s), subsequent encounter for fracture with routine healing: Secondary | ICD-10-CM

## 2017-11-27 DIAGNOSIS — M2012 Hallux valgus (acquired), left foot: Secondary | ICD-10-CM

## 2017-11-29 ENCOUNTER — Encounter: Payer: Self-pay | Admitting: Family Medicine

## 2017-11-29 ENCOUNTER — Ambulatory Visit: Payer: Medicare Other | Admitting: Family Medicine

## 2017-11-29 VITALS — BP 104/70 | HR 88 | Temp 97.8°F | Resp 16 | Ht 67.0 in | Wt 153.0 lb

## 2017-11-29 DIAGNOSIS — I952 Hypotension due to drugs: Secondary | ICD-10-CM | POA: Diagnosis not present

## 2017-11-29 DIAGNOSIS — E871 Hypo-osmolality and hyponatremia: Secondary | ICD-10-CM | POA: Diagnosis not present

## 2017-11-29 DIAGNOSIS — F3160 Bipolar disorder, current episode mixed, unspecified: Secondary | ICD-10-CM

## 2017-11-29 LAB — BASIC METABOLIC PANEL
BUN: 21 mg/dL (ref 6–23)
CALCIUM: 10.4 mg/dL (ref 8.4–10.5)
CHLORIDE: 96 meq/L (ref 96–112)
CO2: 30 meq/L (ref 19–32)
CREATININE: 1.14 mg/dL (ref 0.40–1.20)
GFR: 50.86 mL/min — ABNORMAL LOW (ref >60.00)
GLUCOSE: 96 mg/dL (ref 70–99)
Potassium: 4.4 mEq/L (ref 3.5–5.1)
Sodium: 133 mEq/L — ABNORMAL LOW (ref 135–145)

## 2017-11-29 MED ORDER — ALPRAZOLAM 1 MG PO TABS: 1.0000 mg | ORAL_TABLET | Freq: Three times a day (TID) | ORAL | 0 refills | Status: DC

## 2017-11-29 MED ORDER — QUETIAPINE FUMARATE 50 MG PO TABS: ORAL_TABLET | ORAL | 0 refills | Status: DC

## 2017-11-29 MED ORDER — LAMOTRIGINE 100 MG PO TABS: 300.0000 mg | ORAL_TABLET | Freq: Every day | ORAL | 11 refills | Status: DC

## 2017-11-29 NOTE — Progress Notes (Signed)
Subjective:   Patient ID: Erin Lopez, female   DOB: 67 y.o.   MRN: [id number]   HPI Patient presents stating her second toe on her left foot is red and sore and she traumatized in around 1 week ago and thinks she most likely broke it   ROS      Objective:  Physical Exam  Neurovascular status intact muscle strength is adequate with quite a bit of inflammation and pain of the second digit left foot at the inner phalangeal joint.  She is had previous implant done of the lesser MPJ by me several years ago     Assessment:  Possibility for fracture versus contusion second digit left     Plan:  H&P condition reviewed and at this point I discussed fracture and the fact the implant is part of this pathology and will have to carefully watch healing and she will immobilize it at the current time.  She will be seen back to recheck 4 weeks or earlier if necessary and may ultimately require revisional surgery if no healing occurs  X-ray indicates the fracture occurred in the shaft of the second digit proximal phalanx left and there has been some rotation of it in the transverse plane in a lateral fashion and will have to watch this and see how it heals because of the implant

## 2017-11-29 NOTE — Progress Notes (Signed)
11/29/2017  CC:  Chief Complaint  Patient presents with  . Hospitalization Follow-up    TCM    Patient is a 67 y.o. Caucasian female who presents for  hospital follow up, specifically [facility] face-to-face visit. Dates hospitalized: 8/17-8/21, 2019. Days since d/c from hospital: 7 days Patient was discharged from hospital to skilled nursing. Reason for admission to hospital: mental status changes, was determined to be in acute manic state secondary to her bipolar d/o. Of note, she had just been in hospital 8/15-8/17, 2019 for having hyponatremia after having over-ingested water (trying to hydrate well to prevent recurrent kidney stones). Date of interactive (phone) contact with patient and/or caregiver: 11/24/17  I have reviewed patient's discharge summary plus pertinent specific notes, labs, and imaging from the hospitalization.   She did not need inpatient psychiatric hospitalization.  Recommended to follow-up with her psychiatrist as an outpatient.  Patient refused referral to any of the psychiatrist and said that she will follow-up with me for the psych follow-up. She was d/c'd to a skilled nursing facility for unknown reasons.  She still feels excessively talkative with times of pressured speech, periods of euphoria alt with periods of depression w/out suicidal thinking.  Fluctuates throughout her day multiple times.  Wakes up after about 6 hrs of sleep and feels wired. No excessive risk taking behavior, no hallucinations or delusions.  Some irritability. Has more HA's lately--bilat parietal regions--not on temples. Has periods of feeling SOB, thinks this may be associated with times of feeling more manic.  Medication reconciliation was done today and patient is not taking meds as recommended by discharging hospitalist/specialist.  She is taking seroquel 50mg  tid.  She is taking ambien hs and still on lamictal.  She is on xanax 0.5mg  bid (as she has been in the past,  prior to hospitalization) and NOT on clonazepam that was rx'd by hospitalist at d/c home. No pain meds in at least the last week or two.  Not on an antidepressant now (she was taken off this while in hospital).  PMH:  Past Medical History:  Diagnosis Date  . Alcoholism in remission [facility])    states last alcohol 11 yrs ago  . Anxiety   . Asymptomatic gallstones   . Bipolar 1 disorder ([place])    Admission for mania x 2 (most recent 11/2017)  . Cervical spondylosis without myelopathy 12/19/2013   MRI 02/2014: multilevel DDD/spondylosis, not much change compared to prior MRI.  Pt not in favor of invasive therapy for her neck as of 04/2014.  Marland Kitchen Chronic renal insufficiency, stage II (mild)   . COPD (chronic obstructive pulmonary disease) (HCC)    Moderate: anoro started 04/2017 by pulm, pt symptomatically improved and PFTs stable at f/u 06/2017--pulm f/u planned 1 yr.  . Endometritis 05/2017   possible; empiric tx with Flagyl by GYN.  . Environmental allergies   . Fibromyalgia   . GERD (gastroesophageal reflux disease)   . Herpes zoster 08/07/2017   L scapular region  . Hiatal hernia   . History of adenomatous polyp of colon 04/2008; 08/2014   No high grade dysplasia: recall 5 yrs ([name], [name])  . History of basal cell carcinoma excision    NOSE  . History of Helicobacter pylori infection 04/2008   +gastric biopsy (gastritis but no metaplasia, dysplasia, or malignancy identified)  . History of TIA (transient ischemic attack)    secondary to HELLP syndrome 1994 post c/s--  residual memory loss  . Hyperlipidemia  no meds in > a decade per pt.  Recommended statin 06/21/16.  Marland Kitchen Hypertension   . IFG (impaired fasting glucose) 06/2016   Fasting gluc 105; HbA1c at that time was 6.0%.  . Internal hemorrhoids   . Left foot pain    Hallux deformity+adhesive capsulitis+ hammertoe:  Severe structural bunion deformity with hallux interphalangeus and severe arthritis of the second MPJ left  foot--surgical repair/osteotomies by [name] 04/2017.  . Memory loss    Abnl MRI brain and CT brain c/w chronic microvascular ischemia.  Repeat MRI brain 02/2014 stable (Dr. Mayra Reel with no plan to f/u with neuro as of 04/2014)  . Osteoporosis 05/2010   2012 'penia; 06/2015 'porosis:  prolia.  repeat DEXA 2 yrs.  As of 12/2016 pt going to discuss w/GYN whether or not to continue prolia.  DEXA 09/2017 T-score -2.2 femur neck.  . Pelvic floor dysfunction    [facility] ([name])  . Postmenopausal vaginal bleeding 05/2017   x 1 small episode; GYN attempted endo bx but unable to penetrate cervix due to severe cerv stenosis (due to postmenopausal state + hx of LEEP).  Endo u/s showed thin endo lining.  Per GYN---no evidence of endomet pathology on w/u---obs as of 07/2017.  Marland Kitchen Recurrent kidney stones 08/2013   Left ureteral calculus: Perc nephr--cystoscopy w/ureteroscopy + laser for stone removal.  Residual asymptomatic left renal nephrolithiasis <27mm present post-procedure.  Right sided hydronephrosis-persistent (on u/s)--urol ordered CT to further eval 08/2016: 1.6 cm nonobstructing stone lower pole left kidney, no hydro, plan for PCN extraction ([facility])  . Shortness of breath   . Sprain of neck 12/19/2013  . TMJ (temporomandibular joint disorder)    USES  MOUTH GUARD  . Vitamin D deficiency 05/2011  . Weak urinary stream 07/2016   with elevated PVR and mild left hydronephrosis secondary to this ([facility] started flomax 0.4mg  qd for this).    PSH:  Past Surgical History:  Procedure Laterality Date  . ANTERIOR CERVICAL DECOMP/DISCECTOMY FUSION  06-30-2009   C3 -- C5  AND EXPLORATION OF FUSION C5-7 W/  PLATE REMOVAL  . CERVICAL FUSION  2002   anterior C5 -- C7  . CERVICAL SPINE SURGERY  08-27-1999     C6 -- T1  LAMINECTOMY/  DISKECTOMY  . CESAREAN SECTION  1994  . COLONOSCOPY W/ POLYPECTOMY  04/2008;08/2014   2016 tubular adenoma x 1; +diverticulosis and int/ext hemorrhoids.   Recall 5 yrs  . CYSTOSCOPY WITH URETEROSCOPY AND STENT PLACEMENT Left 08/28/2013   Procedure: CYSTOSCOPY, RGP,  WITH URETEROSCOPY AND STENT REMOVAL;  Surgeon: [name], MD;  Location: [facility];  Service: Urology;  Laterality: Left;  . DEXA  09/2017   T-score -2.2 femur neck: improved compared to 06/2015.  Marland Kitchen ESOPHAGOGASTRODUODENOSCOPY  2010; 08/2014   +Candidal esophagitis; Mild chronic gastritis w/intestinal metaplasia--NEG H pylori, neg for eosinophilic esoph  . HOLMIUM LASER APPLICATION Left 10/17/9676   Procedure: HOLMIUM LASER APPLICATION;  Surgeon: [name], MD;  Location[facility];  Service: Urology;  Laterality: Left;  . NEGATIVE SLEEP STUDY  04-01-2007  . NEPHROLITHOTOMY Left 08/05/2013   Procedure: NEPHROLITHOTOMY PERCUTANEOUS;  Surgeon: [name], MD;  Location: [facility];  Service: Urology;  Laterality: Left;  . TONSILLECTOMY AND ADENOIDECTOMY  1975  . TOTAL KNEE ARTHROPLASTY Right 2003  . ULTRASOUND EXAM,PELVIC COMPLETE ([facility] HX)  06/25/15   NORMAL (done by GYN)    MEDS:  Outpatient Medications Prior to Visit  Medication Sig  Dispense Refill  . albuterol (PROAIR HFA) 108 (90 Base) MCG/ACT inhaler INHALE 2 PUFFS INTO THE LUNGS EVERY 4 (FOUR) HOURS AS NEEDED FOR WHEEZING OR SHORTNESS OF BREATH. 8.5 each 1  . ANORO ELLIPTA 62.5-25 MCG/INH AEPB TAKE 1 PUFF BY MOUTH EVERY DAY 60 each 3  . atorvastatin (LIPITOR) 20 MG tablet TAKE 1 TABLET (20 MG TOTAL) BY MOUTH DAILY. 30 tablet 5  . carvedilol (COREG) 25 MG tablet TAKE 1/2 TAB BY MOUTH TWICE A DAY 90 tablet 1  . cetirizine (ZYRTEC) 10 MG tablet Take 10 mg by mouth every morning.     . cyclobenzaprine (FLEXERIL) 10 MG tablet Take 1 tablet (10 mg total) by mouth 2 (two) times daily as needed for muscle spasms. 10 tablet 0  . fluticasone (FLONASE) 50 MCG/ACT nasal spray SPRAY 2 SPRAYS INTO EACH NOSTRIL EVERY DAY 16 g 3  . lisinopril (PRINIVIL,ZESTRIL) 40 MG tablet TAKE 1 TABLET (40 MG TOTAL) BY  MOUTH DAILY. 30 tablet 5  . montelukast (SINGULAIR) 10 MG tablet TAKE 1 TABLET (10 MG TOTAL) BY MOUTH EVERY EVENING. 30 tablet 12  . polyethylene glycol powder (GLYCOLAX/MIRALAX) powder TAKE 17 G BY MOUTH DAILY. 3162 g 0  . potassium citrate (UROCIT-K) 10 MEQ (1080 MG) SR tablet Take 20 mEq by mouth 2 (two) times daily.  11  . QUEtiapine (SEROQUEL) 50 MG tablet Take 1 tablet (50 mg total) by mouth 2 (two) times daily as needed (anxiety). 30 tablet 0  . ranitidine (ZANTAC) 150 MG capsule Take 150 mg by mouth 2 (two) times daily as needed.     . tamsulosin (FLOMAX) 0.4 MG CAPS capsule Take 0.4 mg by mouth daily.  11  . valACYclovir (VALTREX) 1000 MG tablet Take 1 tablet (1,000 mg total) by mouth 3 (three) times daily. 21 tablet 0  . clonazePAM (KLONOPIN) 0.5 MG tablet Take 1 tablet (0.5 mg total) by mouth 2 (two) times daily as needed (anxiety). (Patient not taking: Reported on 11/29/2017) 20 tablet 0  . Oxycodone HCl 10 MG TABS Take 1 tablet (10 mg total) by mouth every 6 (six) hours as needed (severe pain). (Patient not taking: Reported on 11/29/2017) 15 tablet 0  . QUEtiapine (SEROQUEL) 100 MG tablet Take 1 tablet (100 mg total) by mouth at bedtime. (Patient not taking: Reported on 11/29/2017) 30 tablet 0   No facility-administered medications prior to visit.   EXAM BP 101/66 (BP Location: Left Arm, Patient Position: Sitting, Cuff Size: Normal)   Pulse 88   Temp 97.8 F (36.6 C) (Oral)   Resp 16   Ht 5\' 7"  (1.702 m)   Wt 153 lb (69.4 kg)   SpO2 100%   BMI 23.96 kg/m  BP today was 101/66 upon check-in.  Manual recheck at end of o/v was 104/70. Gen: alert, NAD. AFFECT: pleasant, a bit overly talkative and silly at times. Slight difficulty keeping her on 1 subject at a time. SWH:QPRF: no injection, icteris, swelling, or exudate.  EOMI, PERRLA. Mouth: lips without lesion/swelling.  Oral mucosa pink and moist. Oropharynx without erythema, exudate, or swelling.  CV: RRR, no m/r/g.   LUNGS: CTA  bilat, nonlabored resps, good aeration in all lung fields. Neuro: CN 2-12 intact bilaterally, strength 5/5 in proximal and distal upper extremities and lower extremities bilaterally.  No sensory deficits.  No tremor.  No ataxia.  Upper extremity and lower extremity DTRs symmetric.  No pronator drift.   Pertinent labs/imaging  EEG 11/19/17: Impression The EEG is abnormal and findings  are suggestive of mild generalized cerebral dysfunction. Epileptiform features were not seen during this recording.  11/20/17: MRI brain w/out contrast: IMPRESSION: 1. No acute intracranial abnormality. 2. Mildly age advanced cerebral white matter disease, unchanged from 2015 and nonspecific. Considerations include chronic small vessel ischemia, vasculitis, migraines, prior infection/inflammation, and demyelinating disease.   Lab Results  Component Value Date   TSH 1.766 11/16/2017   Lab Results  Component Value Date   WBC 8.2 11/20/2017   HGB 11.6 (L) 11/20/2017   HCT 33.7 (L) 11/20/2017   MCV 88.2 11/20/2017   PLT 325 11/20/2017   Lab Results  Component Value Date   IRON 84 04/29/2014   FERRITIN 27.6 04/29/2014   Lab Results  Component Value Date   VITAMINB12 607 04/29/2014    Lab Results  Component Value Date   CREATININE 0.92 11/22/2017   BUN 13 11/22/2017   NA 140 11/22/2017   K 3.9 11/22/2017   CL 104 11/22/2017   CO2 29 11/22/2017   Lab Results  Component Value Date   ALT 18 11/19/2017   AST 19 11/19/2017   ALKPHOS 51 11/19/2017   BILITOT 0.9 11/19/2017   Lab Results  Component Value Date   CHOL 168 07/18/2017   Lab Results  Component Value Date   HDL 66.20 07/18/2017   Lab Results  Component Value Date   LDLCALC 85 07/18/2017   Lab Results  Component Value Date   TRIG 86.0 07/18/2017   Lab Results  Component Value Date   CHOLHDL 3 07/18/2017   Lab Results  Component Value Date   HGBA1C 5.7 07/18/2017    ASSESSMENT/PLAN:  1) Hosp f/u s/p acute manic  episode. Bipolar disorder, currently mixed episode-->gradually improving. Plan:  Stop Ambien.  Continue taking seroquel 50mg  in morning, 50mg  about 2 pm, and increase to 100 mg around bedtime.  Take a full pill of your xanax in the morning, around 2 pm, and around bedtime.  Continue taking 3 of the 100 mg lamictal tabs once a day.  2) Low bp; pt with dx of HTN:  Take only 1/2 of your lisinopril tab once a day. Continue coreg 25mg , 1/2 tab po bid.  3) Chronic pain syndrome: fibromyalgia, C spine DDD: she has taken herself off oxycodone and wants to stay off of this type of med--this is great.  4) Hyponatremia; from water intoxication. This was corrected during hospitalization. Monitor sodium today. Discussed approp water intake.  Medical decision making of moderate complexity was utilized today.  An After Visit Summary was printed and given to the patient.  FOLLOW UP:  1 week f/u psych  Signed:  Crissie Sickles, MD           11/29/2017

## 2017-11-29 NOTE — Patient Instructions (Addendum)
Stop Ambien.  Continue taking seroquel 50mg  in morning, 50mg  about 2 pm, and 100 mg around bedtime.  Take a full pill of your xanax in the morning, around 2 pm, and around bedtime.  Continue taking 3 of the 100 mg lamictal tabs once a day.  Take only 1/2 of your lisinopril tab once a day.

## 2017-11-30 ENCOUNTER — Encounter: Payer: Self-pay | Admitting: *Deleted

## 2017-12-03 DIAGNOSIS — S92912A Unspecified fracture of left toe(s), initial encounter for closed fracture: Secondary | ICD-10-CM

## 2017-12-03 HISTORY — DX: Unspecified fracture of left toe(s), initial encounter for closed fracture: S92.912A

## 2017-12-05 ENCOUNTER — Other Ambulatory Visit: Payer: Self-pay | Admitting: Family Medicine

## 2017-12-06 ENCOUNTER — Ambulatory Visit: Payer: Medicare Other | Admitting: Family Medicine

## 2017-12-06 ENCOUNTER — Encounter: Payer: Self-pay | Admitting: Family Medicine

## 2017-12-06 VITALS — BP 93/62 | HR 85 | Temp 98.1°F | Resp 16 | Ht 67.0 in | Wt 154.4 lb

## 2017-12-06 DIAGNOSIS — I952 Hypotension due to drugs: Secondary | ICD-10-CM

## 2017-12-06 DIAGNOSIS — F3162 Bipolar disorder, current episode mixed, moderate: Secondary | ICD-10-CM | POA: Diagnosis not present

## 2017-12-06 DIAGNOSIS — I10 Essential (primary) hypertension: Secondary | ICD-10-CM

## 2017-12-06 MED ORDER — FAMOTIDINE 40 MG PO TABS: 40.0000 mg | ORAL_TABLET | Freq: Every day | ORAL | 11 refills | Status: DC

## 2017-12-06 NOTE — Progress Notes (Signed)
OFFICE VISIT  12/10/2017   CC:  Chief Complaint  Patient presents with  . Follow-up    psych     HPI:    Patient is a 67 y.o. Caucasian female who presents for 1 wk f/u bipolar disorder, current mixed episode, at last visit was gradually improving. Last visit I d/c'd her ambien, increased her evening dose of seroquel to 100mg , recommended she take a full pill of xanax tid and continued her on lamictal 300 mg qd. Still getting better---less euphoria and manic type thinking/behavior, says "I miss it though". Feeling lots of fatigue from seroquel.  Sleeping 6 hours and "wake up wide awake". Due to sedation she takes only 1/2-1 tab bid xanax.   Hx of HTN, low bp recently-->I decreased her lisinopril to 1/2 of 40mg  tab qd and kept her dosing of coreg the same. She has not checked her bp at home since last visit "but I feel like it is low".  No orthostatic dizziness.  Past Medical History:  Diagnosis Date  . Alcoholism in remission [facility])    states last alcohol 11 yrs ago  . Anxiety   . Asymptomatic gallstones   . Bipolar 1 disorder ([place])    Admission for mania x 2 (most recent 11/2017)  . Cervical spondylosis without myelopathy 12/19/2013   MRI 02/2014: multilevel DDD/spondylosis, not much change compared to prior MRI.  Pt not in favor of invasive therapy for her neck as of 04/2014.  Marland Kitchen Chronic renal insufficiency, stage II (mild)   . COPD (chronic obstructive pulmonary disease) (HCC)    Moderate: anoro started 04/2017 by pulm, pt symptomatically improved and PFTs stable at f/u 06/2017--pulm f/u planned 1 yr.  . Endometritis 05/2017   possible; empiric tx with Flagyl by GYN.  . Environmental allergies   . Fibromyalgia   . GERD (gastroesophageal reflux disease)   . Herpes zoster 08/07/2017   L scapular region  . Hiatal hernia   . History of adenomatous polyp of colon 04/2008; 08/2014   No high grade dysplasia: recall 5 yrs ([name], [name])  . History of basal cell carcinoma  excision    NOSE  . History of Helicobacter pylori infection 04/2008   +gastric biopsy (gastritis but no metaplasia, dysplasia, or malignancy identified)  . History of TIA (transient ischemic attack)    secondary to HELLP syndrome 1994 post c/s--  residual memory loss  . Hyperlipidemia    no meds in > a decade per pt.  Recommended statin 06/21/16.  Marland Kitchen Hypertension   . IFG (impaired fasting glucose) 06/2016   Fasting gluc 105; HbA1c at that time was 6.0%.  . Internal hemorrhoids   . 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 04/2017.  . Memory loss    Abnl MRI brain and CT brain c/w chronic microvascular ischemia.  Repeat MRI brain 02/2014 stable (Dr. Mayra Reel with no plan to f/u with neuro as of 04/2014)  . Osteoporosis 05/2010   2012 'penia; 06/2015 'porosis:  prolia.  repeat DEXA 2 yrs.  As of 12/2016 pt going to discuss w/GYN whether or not to continue prolia.  DEXA 09/2017 T-score -2.2 femur neck.  . Pelvic floor dysfunction    [facility] ([name])  . Postmenopausal vaginal bleeding 05/2017   x 1 small episode; GYN attempted endo bx but unable to penetrate cervix due to  severe cerv stenosis (due to postmenopausal state + hx of LEEP).  Endo u/s showed thin endo lining.  Per GYN---no evidence of endomet pathology on w/u---obs as of 07/2017.  Marland Kitchen Recurrent kidney stones 08/2013   Left ureteral calculus: Perc nephr--cystoscopy w/ureteroscopy + laser for stone removal.  Residual asymptomatic left renal nephrolithiasis <55mm present post-procedure.  Right sided hydronephrosis-persistent (on u/s)--urol ordered CT to further eval 08/2016: 1.6 cm nonobstructing stone lower pole left kidney, no hydro, plan for PCN extraction ([facility])  . Shortness of breath   . Sprain of neck 12/19/2013  . TMJ (temporomandibular joint  disorder)    USES  MOUTH GUARD  . Vitamin D deficiency 05/2011  . Weak urinary stream 07/2016   with elevated PVR and mild left hydronephrosis secondary to this ([facility] started flomax 0.4mg  qd for this).    Past Surgical History:  Procedure Laterality Date  . ANTERIOR CERVICAL DECOMP/DISCECTOMY FUSION  06-30-2009   C3 -- C5  AND EXPLORATION OF FUSION C5-7 W/  PLATE REMOVAL  . CERVICAL FUSION  2002   anterior C5 -- C7  . CERVICAL SPINE SURGERY  08-27-1999     C6 -- T1  LAMINECTOMY/  DISKECTOMY  . CESAREAN SECTION  1994  . COLONOSCOPY W/ POLYPECTOMY  04/2008;08/2014   2016 tubular adenoma x 1; +diverticulosis and int/ext hemorrhoids.  Recall 5 yrs  . CYSTOSCOPY WITH URETEROSCOPY AND STENT PLACEMENT Left 08/28/2013   Procedure: CYSTOSCOPY, RGP,  WITH URETEROSCOPY AND STENT REMOVAL;  Surgeon: [name], MD;  Location: [facility];  Service: Urology;  Laterality: Left;  . DEXA  09/2017   T-score -2.2 femur neck: improved compared to 06/2015.  Marland Kitchen ESOPHAGOGASTRODUODENOSCOPY  2010; 08/2014   +Candidal esophagitis; Mild chronic gastritis w/intestinal metaplasia--NEG H pylori, neg for eosinophilic esoph  . HOLMIUM LASER APPLICATION Left 4/85/4627   Procedure: HOLMIUM LASER APPLICATION;  Surgeon: [name], MD;  Location: [facility];  Service: Urology;  Laterality: Left;  . NEGATIVE SLEEP STUDY  04-01-2007  . NEPHROLITHOTOMY Left 08/05/2013   Procedure: NEPHROLITHOTOMY PERCUTANEOUS;  Surgeon: [name], MD;  Location: [facility];  Service: Urology;  Laterality: Left;  . TONSILLECTOMY AND ADENOIDECTOMY  1975  . TOTAL KNEE ARTHROPLASTY Right 2003  . ULTRASOUND EXAM,PELVIC COMPLETE ([facility] HX)  06/25/15   NORMAL (done by GYN)    Outpatient Medications Prior to Visit  Medication Sig Dispense Refill  . albuterol (PROAIR HFA) 108 (90 Base) MCG/ACT inhaler INHALE 2 PUFFS INTO THE LUNGS EVERY 4 (FOUR) HOURS AS NEEDED FOR WHEEZING OR SHORTNESS OF BREATH. 8.5  each 1  . ALPRAZolam (XANAX) 1 MG tablet Take 1 tablet (1 mg total) by mouth 3 (three) times daily. 90 tablet 0  . ANORO ELLIPTA 62.5-25 MCG/INH AEPB TAKE 1 PUFF BY MOUTH EVERY DAY 60 each 3  . atorvastatin (LIPITOR) 20 MG tablet TAKE 1 TABLET (20 MG TOTAL) BY MOUTH DAILY. 30 tablet 5  . cyclobenzaprine (FLEXERIL) 10 MG tablet Take 1 tablet (10 mg total) by mouth 2 (two) times daily as needed for muscle spasms. 10 tablet 0  . fluticasone (FLONASE) 50 MCG/ACT nasal spray SPRAY 2 SPRAYS INTO EACH NOSTRIL EVERY DAY 16 g 3  . lamoTRIgine (LAMICTAL) 100 MG tablet Take 3 tablets (300 mg total) by mouth at bedtime. 90 tablet 11  . montelukast (SINGULAIR) 10 MG tablet TAKE 1 TABLET (10 MG TOTAL) BY MOUTH EVERY EVENING. 30 tablet 12  . polyethylene glycol powder (GLYCOLAX/MIRALAX) powder TAKE 17  G BY MOUTH DAILY. 3162 g 0  . potassium citrate (UROCIT-K) 10 MEQ (1080 MG) SR tablet Take 20 mEq by mouth 2 (two) times daily.  11  . QUEtiapine (SEROQUEL) 50 MG tablet 1 tab po qAM, 1 tab po q2pm, and 2 tabs po qhs (Patient taking differently: 0.5-1 tab po qAM and 2 tabs po qhs) 30 tablet 0  . tamsulosin (FLOMAX) 0.4 MG CAPS capsule Take 0.4 mg by mouth daily.  11  . carvedilol (COREG) 25 MG tablet TAKE 1/2 TAB BY MOUTH TWICE A DAY 90 tablet 1  . lisinopril (PRINIVIL,ZESTRIL) 40 MG tablet TAKE 1 TABLET (40 MG TOTAL) BY MOUTH DAILY. 30 tablet 5  . cetirizine (ZYRTEC) 10 MG tablet Take 10 mg by mouth every morning.     . ranitidine (ZANTAC) 150 MG capsule Take 150 mg by mouth 2 (two) times daily as needed.     . valACYclovir (VALTREX) 1000 MG tablet Take 1 tablet (1,000 mg total) by mouth 3 (three) times daily. (Patient not taking: Reported on 12/06/2017) 21 tablet 0   No facility-administered medications prior to visit.     Allergies  Allergen Reactions  . Ceftin [Cefuroxime Axetil] Anaphylaxis    Swelling of eyes and tongue  . Ciprofloxacin Anaphylaxis    Difficulty breathing and generalized swelling  .  Fosamax [Alendronate Sodium] Diarrhea  . Morphine And Related Other (See Comments)    Hallucinations/ disoriented - pt stated, "Only Morphine" - 03/23/17  . Prednisone Other (See Comments)    Hyperactivity  . Seroquel [Quetiapine] Other (See Comments)    oversedation    ROS As per HPI  PE: Blood pressure 93/62, pulse 85, temperature 98.1 F (36.7 C), temperature source Oral, resp. rate 16, height 5\' 7"  (1.702 m), weight 154 lb 6 oz (70 kg), SpO2 98 %. Gen: Alert, well appearing.  Patient is oriented to person, place, time, and situation. AFFECT: pleasant, lucid thought and speech. No further exam today.  LABS:    Chemistry      Component Value Date/Time   NA 133 (L) 11/29/2017 1143   K 4.4 11/29/2017 1143   CL 96 11/29/2017 1143   CO2 30 11/29/2017 1143   BUN 21 11/29/2017 1143   CREATININE 1.14 11/29/2017 1143   CREATININE 1.27 (H) 05/16/2016 1525      Component Value Date/Time   CALCIUM 10.4 11/29/2017 1143   ALKPHOS 51 11/19/2017 0039   AST 19 11/19/2017 0039   ALT 18 11/19/2017 0039   BILITOT 0.9 11/19/2017 0039      IMPRESSION AND PLAN:  1) Bipolar disorder, current mixed episode, gradually improving. No changes in psych regimen today---needs longer to adjust to current dosing. Signif sedative effect from seroquel is limiting its dosing.  May need antidepressant added in near future if depression becomes more prominent. She declines counseling.  2) HTN; bp has been low (? Due to seroquel): stop lisinopril and stop coreg at this time. Monitor bp at home.  An After Visit Summary was printed and given to the patient.  FOLLOW UP: Return in about 2 weeks (around 12/20/2017) for f/u psych and bp and BMET.  Signed:  Crissie Sickles, MD           12/10/2017

## 2017-12-07 ENCOUNTER — Telehealth: Payer: Self-pay | Admitting: *Deleted

## 2017-12-07 DIAGNOSIS — E8881 Metabolic syndrome: Secondary | ICD-10-CM

## 2017-12-07 DIAGNOSIS — K2950 Unspecified chronic gastritis without bleeding: Secondary | ICD-10-CM

## 2017-12-07 DIAGNOSIS — S0501XA Injury of conjunctiva and corneal abrasion without foreign body, right eye, initial encounter: Secondary | ICD-10-CM | POA: Diagnosis not present

## 2017-12-07 NOTE — Telephone Encounter (Signed)
OK for pt to make lab appt to get HbA1c (venous sample, not fingerstick A1c).  Dx is insulin resistance.-thx

## 2017-12-07 NOTE — Telephone Encounter (Signed)
Copied from [place] [phone]. Topic: Appointment Scheduling - Scheduling Inquiry for Clinic >> Dec 07, 2017 11:59 AM Berneta Levins wrote: Reason for CRM:   Pt's eye doctor Dr. Delman Cheadle would like pt to have A1C checked because of change in vision.

## 2017-12-07 NOTE — Telephone Encounter (Signed)
A1c was last check 07/18/17 and was 5.7.   Please advise. Thanks.

## 2017-12-08 NOTE — Telephone Encounter (Signed)
Vitamin B12 ordered.  Will let patient know when she comes in for lab appointment.

## 2017-12-08 NOTE — Telephone Encounter (Signed)
OK.  Pls order vit B12 level, dx is chronic gastritis.-thx

## 2017-12-08 NOTE — Addendum Note (Signed)
Addended by: Ralph Dowdy on: 12/08/2017 01:03 PM   Modules accepted: Orders

## 2017-12-08 NOTE — Telephone Encounter (Signed)
Entered order for A1C and contacted patient to schedule lab appt.  Patient now requesting B12 level to be drawn because she is concerned she isn't getting proper nutrition.  Please advise.

## 2017-12-11 ENCOUNTER — Encounter: Payer: Self-pay | Admitting: Family Medicine

## 2017-12-11 ENCOUNTER — Other Ambulatory Visit (INDEPENDENT_AMBULATORY_CARE_PROVIDER_SITE_OTHER): Payer: Medicare Other

## 2017-12-11 DIAGNOSIS — K2950 Unspecified chronic gastritis without bleeding: Secondary | ICD-10-CM | POA: Diagnosis not present

## 2017-12-11 DIAGNOSIS — E8881 Metabolic syndrome: Secondary | ICD-10-CM

## 2017-12-11 LAB — VITAMIN B12: VITAMIN B 12: 688 pg/mL (ref 211–911)

## 2017-12-11 LAB — HEMOGLOBIN A1C: HEMOGLOBIN A1C: 6.2 % (ref 4.6–6.5)

## 2017-12-12 ENCOUNTER — Other Ambulatory Visit: Payer: Self-pay | Admitting: *Deleted

## 2017-12-12 ENCOUNTER — Encounter: Payer: Self-pay | Admitting: Family Medicine

## 2017-12-12 DIAGNOSIS — R7303 Prediabetes: Secondary | ICD-10-CM

## 2017-12-18 DIAGNOSIS — M150 Primary generalized (osteo)arthritis: Secondary | ICD-10-CM | POA: Diagnosis not present

## 2017-12-18 DIAGNOSIS — M797 Fibromyalgia: Secondary | ICD-10-CM | POA: Diagnosis not present

## 2017-12-18 DIAGNOSIS — Z79891 Long term (current) use of opiate analgesic: Secondary | ICD-10-CM | POA: Diagnosis not present

## 2017-12-19 ENCOUNTER — Other Ambulatory Visit (INDEPENDENT_AMBULATORY_CARE_PROVIDER_SITE_OTHER): Payer: Medicare Other

## 2017-12-19 DIAGNOSIS — R7303 Prediabetes: Secondary | ICD-10-CM

## 2017-12-19 LAB — GLUCOSE, RANDOM: Glucose, Bld: 112 mg/dL — ABNORMAL HIGH (ref 70–99)

## 2017-12-20 ENCOUNTER — Ambulatory Visit (INDEPENDENT_AMBULATORY_CARE_PROVIDER_SITE_OTHER): Payer: Medicare Other | Admitting: Family Medicine

## 2017-12-20 ENCOUNTER — Telehealth: Payer: Self-pay | Admitting: Family Medicine

## 2017-12-20 ENCOUNTER — Encounter: Payer: Self-pay | Admitting: Family Medicine

## 2017-12-20 VITALS — BP 118/72 | HR 79 | Temp 98.1°F | Resp 16 | Ht 67.0 in | Wt 156.1 lb

## 2017-12-20 DIAGNOSIS — F3160 Bipolar disorder, current episode mixed, unspecified: Secondary | ICD-10-CM | POA: Diagnosis not present

## 2017-12-20 DIAGNOSIS — I952 Hypotension due to drugs: Secondary | ICD-10-CM

## 2017-12-20 DIAGNOSIS — F411 Generalized anxiety disorder: Secondary | ICD-10-CM

## 2017-12-20 DIAGNOSIS — I10 Essential (primary) hypertension: Secondary | ICD-10-CM

## 2017-12-20 DIAGNOSIS — R7303 Prediabetes: Secondary | ICD-10-CM | POA: Diagnosis not present

## 2017-12-20 MED ORDER — CARVEDILOL 25 MG PO TABS: ORAL_TABLET | ORAL | 6 refills | Status: DC

## 2017-12-20 MED ORDER — PROPYLENE GLYCOL 0.6 % OP SOLN: OPHTHALMIC | 6 refills | Status: DC

## 2017-12-20 NOTE — Telephone Encounter (Signed)
Lab results faxed to Dr. Maurie Boettcher office.

## 2017-12-20 NOTE — Patient Instructions (Signed)
Stay off of your lisinopril but keep taking your carvedilol 1/2 tab twice a day. Check blood pressure and heart rate 3 days a week and bring these with you to your next follow up appointment. Your blood pressure goal is <130 on top and <80 on bottom.  Also you should NOT take your blood pressure med if your blood pressure is <105 on top or <55 on bottom.

## 2017-12-20 NOTE — Progress Notes (Signed)
OFFICE VISIT  12/20/2017   CC:  Chief Complaint  Patient presents with  . Follow-up    psych and BP    HPI:    Patient is a 67 y.o. Caucasian female who presents for 10 day f/u bipolar II, anxiety, and recent low blood pressures in this pt with dx of HTN.  Last visit we stopped her coreg and lisinopril. We kept her psych med regimen the same.  She continuously declines counseling.  She thought she was supposed to stop the seroquel so she stopped it. Feels much more awake and she is very pleased with this.   Mood: she is feeling like this is stable.  Sleeping fine, no excess energy.  No pressured speech, no euphoria.   Has been spending some time around people and says interactions have been good.   Anxiety: she is doing better, learning to not try to let everything bother her. Takes xanax qd to bid.   Denies any significant crying spells or periods of feeling signif depression.  Not really isolating too much. Her memory is coming back some.  No delusions or hallucinations.  BPs:  Have been back up into normal range.  Syst 150s yesterday so she took 1/2 of coreg. Today she took a 1/2 coreg and plans on taking another this evening.  Not back on lisinopril yet.  Also, since last visit her eye MD requested that I check her HbA1c b/c of some problems she has been having with blurry vision.  A1c was 6.2% at that time, increased from 5.7% five months earlier.  We had her come in for fasting glucose yesterday and it was 112 (she said she was not actually fasting).     Past Medical History:  Diagnosis Date  . Alcoholism in remission [facility])    states last alcohol 11 yrs ago  . Anxiety   . Asymptomatic gallstones   . Bipolar 1 disorder ([place])    Admission for mania x 2 (most recent 11/2017)  . Cervical spondylosis without myelopathy 12/19/2013   MRI 02/2014: multilevel DDD/spondylosis, not much change compared to prior MRI.  Pt not in favor of invasive therapy for her neck as of 04/2014.   Marland Kitchen COPD (chronic obstructive pulmonary disease) (HCC)    Moderate: anoro started 04/2017 by pulm, pt symptomatically improved and PFTs stable at f/u 06/2017--pulm f/u planned 1 yr.  . Endometritis 05/2017   possible; empiric tx with Flagyl by GYN.  . Environmental allergies   . Fibromyalgia   . GERD (gastroesophageal reflux disease)   . Herpes zoster 08/07/2017   L scapular region  . Hiatal hernia   . History of adenomatous polyp of colon 04/2008; 08/2014   No high grade dysplasia: recall 5 yrs ([name], [name])  . History of basal cell carcinoma excision    NOSE  . History of Helicobacter pylori infection 04/2008   +gastric biopsy (gastritis but no metaplasia, dysplasia, or malignancy identified)  . History of TIA (transient ischemic attack)    secondary to HELLP syndrome 1994 post c/s--  residual memory loss  . Hyperlipidemia    no meds in > a decade per pt.  Recommended statin 06/21/16.  Marland Kitchen Hypertension   . Internal hemorrhoids   . 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 04/2017.  . Memory loss    Abnl MRI brain and  CT brain c/w chronic microvascular ischemia.  Repeat MRI brain 02/2014 stable (Dr. Mayra Reel with no plan to f/u with neuro as of 04/2014)  . Osteoporosis 05/2010   2012 'penia; 06/2015 'porosis:  prolia.  repeat DEXA 2 yrs.  As of 12/2016 pt going to discuss w/GYN whether or not to continue prolia.  DEXA 09/2017 T-score -2.2 femur neck.  . Pelvic floor dysfunction    [facility] ([name])  . Postmenopausal vaginal bleeding 05/2017   x 1 small episode; GYN attempted endo bx but unable to penetrate cervix due to severe cerv stenosis (due to postmenopausal state + hx of LEEP).  Endo u/s showed thin endo lining.  Per GYN---no evidence of endomet pathology on w/u---obs as of 07/2017.  Marland Kitchen  Prediabetes 06/2016   Fasting gluc 105; HbA1c at that time was 6.0%.  A1c 6.2% 12/2017.  Marland Kitchen Recurrent kidney stones 08/2013   Left ureteral calculus: Perc nephr--cystoscopy w/ureteroscopy + laser for stone removal.  Residual asymptomatic left renal nephrolithiasis <69mm present post-procedure.  Right sided hydronephrosis-persistent (on u/s)--urol ordered CT to further eval 08/2016: 1.6 cm nonobstructing stone lower pole left kidney, no hydro, plan for PCN extraction ([facility])  . Shortness of breath   . Sprain of neck 12/19/2013  . TMJ (temporomandibular joint disorder)    USES  MOUTH GUARD  . Toe fracture, left 12/2017   2nd toe prox phalanx (immobilization, [name])  . Vitamin D deficiency 05/2011  . Weak urinary stream 07/2016   with elevated PVR and mild left hydronephrosis secondary to this ([facility] started flomax 0.4mg  qd for this).    Past Surgical History:  Procedure Laterality Date  . ANTERIOR CERVICAL DECOMP/DISCECTOMY FUSION  06-30-2009   C3 -- C5  AND EXPLORATION OF FUSION C5-7 W/  PLATE REMOVAL  . CERVICAL FUSION  2002   anterior C5 -- C7  . CERVICAL SPINE SURGERY  08-27-1999     C6 -- T1  LAMINECTOMY/  DISKECTOMY  . CESAREAN SECTION  1994  . COLONOSCOPY W/ POLYPECTOMY  04/2008;08/2014   2016 tubular adenoma x 1; +diverticulosis and int/ext hemorrhoids.  Recall 5 yrs  . CYSTOSCOPY WITH URETEROSCOPY AND STENT PLACEMENT Left 08/28/2013   Procedure: CYSTOSCOPY, RGP,  WITH URETEROSCOPY AND STENT REMOVAL;  Surgeon: [name], MD;  Location: [facility];  Service: Urology;  Laterality: Left;  . DEXA  09/2017   T-score -2.2 femur neck: improved compared to 06/2015.  Marland Kitchen ESOPHAGOGASTRODUODENOSCOPY  2010; 08/2014   +Candidal esophagitis; Mild chronic gastritis w/intestinal metaplasia--NEG H pylori, neg for eosinophilic esoph  . HOLMIUM LASER APPLICATION Left 4/31/5400   Procedure: HOLMIUM LASER APPLICATION;  Surgeon: [name], MD;  Location: [facility];  Service: Urology;  Laterality: Left;  . NEGATIVE SLEEP STUDY  04-01-2007  . NEPHROLITHOTOMY Left 08/05/2013   Procedure: NEPHROLITHOTOMY PERCUTANEOUS;  Surgeon: [name], MD;  Location: [facility];  Service: Urology;  Laterality: Left;  . TONSILLECTOMY AND ADENOIDECTOMY  1975  . TOTAL KNEE ARTHROPLASTY Right 2003  . ULTRASOUND EXAM,PELVIC COMPLETE ([facility] HX)  06/25/15   NORMAL (done by GYN)    Outpatient Medications Prior to Visit  Medication Sig Dispense Refill  . albuterol (PROAIR HFA) 108 (90 Base) MCG/ACT inhaler INHALE 2 PUFFS INTO THE LUNGS EVERY 4 (FOUR) HOURS AS NEEDED FOR WHEEZING OR SHORTNESS OF BREATH. 8.5 each 1  . ALPRAZolam (XANAX) 1 MG tablet Take 1 tablet (1 mg total) by mouth 3 (three) times daily. 90 tablet  0  . ANORO ELLIPTA 62.5-25 MCG/INH AEPB TAKE 1 PUFF BY MOUTH EVERY DAY 60 each 3  . atorvastatin (LIPITOR) 20 MG tablet TAKE 1 TABLET (20 MG TOTAL) BY MOUTH DAILY. 30 tablet 5  . cyclobenzaprine (FLEXERIL) 10 MG tablet Take 1 tablet (10 mg total) by mouth 2 (two) times daily as needed for muscle spasms. 10 tablet 0  . famotidine (PEPCID) 40 MG tablet Take 1 tablet (40 mg total) by mouth daily. 30 tablet 11  . fluticasone (FLONASE) 50 MCG/ACT nasal spray SPRAY 2 SPRAYS INTO EACH NOSTRIL EVERY DAY 16 g 3  . lamoTRIgine (LAMICTAL) 100 MG tablet Take 3 tablets (300 mg total) by mouth at bedtime. 90 tablet 11  . montelukast (SINGULAIR) 10 MG tablet TAKE 1 TABLET (10 MG TOTAL) BY MOUTH EVERY EVENING. 30 tablet 12  . polyethylene glycol powder (GLYCOLAX/MIRALAX) powder TAKE 17 G BY MOUTH DAILY. 3162 g 0  . potassium citrate (UROCIT-K) 10 MEQ (1080 MG) SR tablet Take 20 mEq by mouth 2 (two) times daily.  11  . tamsulosin (FLOMAX) 0.4 MG CAPS capsule Take 0.4 mg by mouth daily.  11  . QUEtiapine (SEROQUEL) 50 MG tablet 1 tab po qAM, 1 tab po q2pm, and 2 tabs po qhs (Patient not taking: Reported on 12/20/2017) 30 tablet 0   No facility-administered medications prior  to visit.     Allergies  Allergen Reactions  . Ceftin [Cefuroxime Axetil] Anaphylaxis    Swelling of eyes and tongue  . Ciprofloxacin Anaphylaxis    Difficulty breathing and generalized swelling  . Fosamax [Alendronate Sodium] Diarrhea  . Morphine And Related Other (See Comments)    Hallucinations/ disoriented - pt stated, "Only Morphine" - 03/23/17  . Prednisone Other (See Comments)    Hyperactivity  . Seroquel [Quetiapine] Other (See Comments)    oversedation    ROS As per HPI  PE: Blood pressure 118/72, pulse 79, temperature 98.1 F (36.7 C), temperature source Oral, resp. rate 16, height 5\' 7"  (1.702 m), weight 156 lb 2 oz (70.8 kg), SpO2 100 %. Gen: Alert, well appearing.  Patient is oriented to person, place, time, and situation. AFFECT: pleasant, lucid thought and speech. CV: RRR, no m/r/g.   LUNGS: CTA bilat, nonlabored resps, good aeration in all lung fields.   LABS:    Chemistry      Component Value Date/Time   NA 133 (L) 11/29/2017 1143   K 4.4 11/29/2017 1143   CL 96 11/29/2017 1143   CO2 30 11/29/2017 1143   BUN 21 11/29/2017 1143   CREATININE 1.14 11/29/2017 1143   CREATININE 1.27 (H) 05/16/2016 1525      Component Value Date/Time   CALCIUM 10.4 11/29/2017 1143   ALKPHOS 51 11/19/2017 0039   AST 19 11/19/2017 0039   ALT 18 11/19/2017 0039   BILITOT 0.9 11/19/2017 0039     Lab Results  Component Value Date   WBC 8.2 11/20/2017   HGB 11.6 (L) 11/20/2017   HCT 33.7 (L) 11/20/2017   MCV 88.2 11/20/2017   PLT 325 11/20/2017   Lab Results  Component Value Date   HGBA1C 6.2 12/11/2017   Glucose (nonfasting) yesterday= 112  IMPRESSION AND PLAN:  1) Bipolar II, with GAD: she is stable at this time. No med changes/additions.  May still need addition of antidepressant in future, but not now.  2) HTN: with recent low bp's that may have been occurring secondary to seroquel.  BPs now normal and she is back on  coreg 12.5mg  bid.  She'll continue this  med but remain off of her lisinopril for now. Instructions: Stay off of your lisinopril but keep taking your carvedilol 1/2 tab twice a day. Check blood pressure and heart rate 3 days a week and bring these with you to your next follow up appointment. Your blood pressure goal is <130 on top and <80 on bottom.  Also you should NOT take your blood pressure med if your blood pressure is <105 on top or <55 on bottom.  3) Prediabetes: large jump in A1c over the last 5 mo.  Nonfasting serum glucose 112 yesterday. Not sure if hyperglycemia is what is causing her blurry vision but I doubt it.  Will send lab results to Dr. Delman Cheadle.  An After Visit Summary was printed and given to the patient.  FOLLOW UP: Return in about 6 weeks (around 01/31/2018) for routine chronic illness f/u 30 min.  Signed:  Crissie Sickles, MD           12/20/2017

## 2017-12-20 NOTE — Telephone Encounter (Signed)
Pls fax pt's recent glucose check (yesterday) and most recent HbA1c to her eye MD, Dr. Ashley Mariner

## 2017-12-22 ENCOUNTER — Encounter: Payer: Medicare Other | Admitting: Obstetrics & Gynecology

## 2017-12-22 ENCOUNTER — Encounter: Payer: Self-pay | Admitting: Obstetrics & Gynecology

## 2017-12-22 ENCOUNTER — Telehealth: Payer: Self-pay | Admitting: Family Medicine

## 2017-12-22 ENCOUNTER — Ambulatory Visit: Payer: Medicare Other | Admitting: Obstetrics & Gynecology

## 2017-12-22 VITALS — BP 128/76 | Ht 66.0 in | Wt 155.0 lb

## 2017-12-22 DIAGNOSIS — Z780 Asymptomatic menopausal state: Secondary | ICD-10-CM

## 2017-12-22 DIAGNOSIS — Z01419 Encounter for gynecological examination (general) (routine) without abnormal findings: Secondary | ICD-10-CM | POA: Diagnosis not present

## 2017-12-22 DIAGNOSIS — M85852 Other specified disorders of bone density and structure, left thigh: Secondary | ICD-10-CM

## 2017-12-22 MED ORDER — CITALOPRAM HYDROBROMIDE 10 MG PO TABS: 10.0000 mg | ORAL_TABLET | Freq: Every day | ORAL | 0 refills | Status: DC

## 2017-12-22 NOTE — Telephone Encounter (Signed)
OK, we had decided to wait a little longer before starting an antidepressant, but I'll compromise with her and start a low dose of citalopram (celexa) today--will eRx.

## 2017-12-22 NOTE — Telephone Encounter (Signed)
citalopram 10mg  qd eRx'd.

## 2017-12-22 NOTE — Telephone Encounter (Signed)
LMOM for pt to CB.  Okay for PEC to disclose information.

## 2017-12-22 NOTE — Patient Instructions (Signed)
1. Well female exam with routine gynecological exam Normal gynecologic exam.  Pap test negative in September 2018.  No need to repeat this year.  Breast exam normal.  Mammogram negative in June 2019.  Health labs with family physician.  Reassessment of bipolar disorder management with family physician.  Colonoscopy done in 2016.  2. Postmenopause Well on no hormone replacement therapy.  Mild postmenopausal bleeding in January 2019 and pelvic ultrasound showed a thin endometrial lining in March 2019.  3. Osteopenia of neck of left femur Vitamin D supplements, calcium intake of 1.5 g/day including diet and supplements, weightbearing physical activity on a regular basis.  Erin Lopez, it was a pleasure seeing you today!

## 2017-12-22 NOTE — Telephone Encounter (Signed)
Copied from [place] [phone]. Topic: Quick Communication - See Telephone Encounter >> Dec 22, 2017  1:29 PM Berneta Levins wrote: CRM for notification. See Telephone encounter for: 12/22/17.  Pt has been thinking about going on an antidepressant after the conversation with her provider.  Pt would like to consider Celexa or Prozac.  Pt states that she doesn't want anything that would sedate her. Pt can be reached at [phone]

## 2017-12-22 NOTE — Progress Notes (Signed)
Erin Lopez 10-Jul-1950 [id number]   History:    67 y.o. G3P1A2L1 Single  RP:  Established patient presenting for annual gyn exam   HPI: Menopause, well on no HRT.  No PMB since 04/2017, investigation negative 06/2017.  No pelvic pain.  Abstinent.  Breasts wnl.  Urine/BMs normal.  BMI 25.02.  On Lamictal for Bipolar disorder.  Depressive Sx present, no suicidal ideation.  Will schedule reevaluation with her Fam MD as she doesn't feel good on Lamictal.  Health labs Fam MD.  Past medical history,surgical history, family history and social history were all reviewed and documented in the EPIC chart.  Gynecologic History No LMP recorded. Patient is postmenopausal. Contraception: post menopausal status Last Pap: 12/2016. Results were: Negative Last mammogram: 09/2017. Results were: Negative Bone Density: 09/2017 Osteopenia T-Score -2.2 Colonoscopy: 2016  Obstetric History OB History  Gravida Para Term Preterm AB Living  3 1       1   SAB TAB Ectopic Multiple Live Births               # Outcome Date GA Lbr Len/2nd Weight Sex Delivery Anes PTL Lv  3 Gravida           2 Gravida           1 Para              ROS: A ROS was performed and pertinent positives and negatives are included in the history.  GENERAL: No fevers or chills. HEENT: No change in vision, no earache, sore throat or sinus congestion. NECK: No pain or stiffness. CARDIOVASCULAR: No chest pain or pressure. No palpitations. PULMONARY: No shortness of breath, cough or wheeze. GASTROINTESTINAL: No abdominal pain, nausea, vomiting or diarrhea, melena or bright red blood per rectum. GENITOURINARY: No urinary frequency, urgency, hesitancy or dysuria. MUSCULOSKELETAL: No joint or muscle pain, no back pain, no recent trauma. DERMATOLOGIC: No rash, no itching, no lesions. ENDOCRINE: No polyuria, polydipsia, no heat or cold intolerance. No recent change in weight. HEMATOLOGICAL: No anemia or easy bruising or bleeding. NEUROLOGIC: No  headache, seizures, numbness, tingling or weakness. PSYCHIATRIC: No depression, no loss of interest in normal activity or change in sleep pattern.     Exam:   BP 128/76   Ht 5\' 6"  (1.676 m)   Wt 155 lb (70.3 kg)   BMI 25.02 kg/m   Body mass index is 25.02 kg/m.  General appearance : Well developed well nourished female. No acute distress HEENT: Eyes: no retinal hemorrhage or exudates,  Neck supple, trachea midline, no carotid bruits, no thyroidmegaly Lungs: Clear to auscultation, no rhonchi or wheezes, or rib retractions  Heart: Regular rate and rhythm, no murmurs or gallops Breast:Examined in sitting and supine position were symmetrical in appearance, no palpable masses or tenderness,  no skin retraction, no nipple inversion, no nipple discharge, no skin discoloration, no axillary or supraclavicular lymphadenopathy Abdomen: no palpable masses or tenderness, no rebound or guarding Extremities: no edema or skin discoloration or tenderness  Pelvic: Vulva: Normal             Vagina: No gross lesions or discharge  Cervix: No gross lesions or discharge  Uterus  AV, normal size, shape and consistency, non-tender and mobile  Adnexa  Without masses or tenderness  Anus: Normal   Assessment/Plan:  67 y.o. female for annual exam   1. Well female exam with routine gynecological exam Normal gynecologic exam.  Pap test negative in September 2018.  No  need to repeat this year.  Breast exam normal.  Mammogram negative in June 2019.  Health labs with family physician.  Reassessment of bipolar disorder management with family physician.  Colonoscopy done in 2016.  2. Postmenopause Well on no hormone replacement therapy.  Mild postmenopausal bleeding in January 2019 and pelvic ultrasound showed a thin endometrial lining in March 2019.  3. Osteopenia of neck of left femur Vitamin D supplements, calcium intake of 1.5 g/day including diet and supplements, weightbearing physical activity on a regular  basis.  Princess Bruins MD, 1:05 PM 12/22/2017

## 2017-12-24 ENCOUNTER — Other Ambulatory Visit: Payer: Self-pay | Admitting: Family Medicine

## 2017-12-25 ENCOUNTER — Telehealth: Payer: Self-pay | Admitting: Family Medicine

## 2017-12-25 ENCOUNTER — Ambulatory Visit: Payer: Self-pay | Admitting: Family Medicine

## 2017-12-25 MED ORDER — LISINOPRIL 10 MG PO TABS: 10.0000 mg | ORAL_TABLET | Freq: Every day | ORAL | 1 refills | Status: DC

## 2017-12-25 NOTE — Telephone Encounter (Signed)
NO, she is not supposed to be taking this med at this time b/c her bp has been too low. She is supposed to be taking her coreg twice a day, but no other bp med.-thx

## 2017-12-25 NOTE — Telephone Encounter (Signed)
Called in for clarification on which BP medication to take.  I read her Dr. Idelle Leech note for this and the Celexa.  See triage notes.   Reason for Disposition . Caller has medication question only, adult not sick, and triager answers question  Answer Assessment - Initial Assessment Questions 1. SYMPTOMS: "Do you have any symptoms?"     She has called in regarding a question about her BP medication.   She wasn't sure what she was and was not supposed to be taking with the Lisinopril and Coreg. I read her [name] note:  She is not to take the Lisinopril because BP too low.   She is to continue taking the Coreg which you say you are taking but not the Lisinopril.  She has picked up the Rx for the antidepressant Celexa and is taking it.   I clarified that message from [name] with her also.  She verbalized understanding of these instructions.   I confirmed her appt with [name] for 01/31/18 as a follow up.  2. SEVERITY: If symptoms are present, ask "Are they mild, moderate or severe?"     No symptoms just clarifying.  Protocols used: MEDICATION QUESTION CALL-A-AH

## 2017-12-25 NOTE — Telephone Encounter (Signed)
Patient states that her BP is rising in the evenings anywhere from 150/90 to 160/90  ( these are before taking Coreg ) then after taking Coreg it goes back to around 110/70.  Her pulse has been consistent at 100.  Please advise.

## 2017-12-25 NOTE — Telephone Encounter (Signed)
Patient did not request RF.  Must have been auto fill from pharmacy.

## 2017-12-25 NOTE — Telephone Encounter (Signed)
Patient aware of RX sent to pharmacy and instructions.

## 2017-12-25 NOTE — Telephone Encounter (Signed)
LMOM for patient to CB to discuss.  Okay for PEC to discuss information given by Provider.

## 2017-12-25 NOTE — Telephone Encounter (Signed)
OK, I just eRx'd lisinopril 10mg  for her to take once every morning.  Continue to monitor bp at home.-thx

## 2017-12-25 NOTE — Telephone Encounter (Signed)
Please advise RX for Lisinopril.  Last OV notes states patient was " not back on Lisinopril yet" Is she supposed to be taking?

## 2017-12-25 NOTE — Telephone Encounter (Signed)
Patient aware.

## 2017-12-26 ENCOUNTER — Other Ambulatory Visit: Payer: Self-pay | Admitting: General Surgery

## 2017-12-26 MED ORDER — ALBUTEROL SULFATE HFA 108 (90 BASE) MCG/ACT IN AERS: INHALATION_SPRAY | RESPIRATORY_TRACT | 5 refills | Status: DC

## 2017-12-28 DIAGNOSIS — E119 Type 2 diabetes mellitus without complications: Secondary | ICD-10-CM | POA: Diagnosis not present

## 2018-01-01 ENCOUNTER — Ambulatory Visit: Payer: Medicare Other | Admitting: Podiatry

## 2018-01-05 ENCOUNTER — Ambulatory Visit: Payer: Self-pay | Admitting: Family Medicine

## 2018-01-11 ENCOUNTER — Encounter: Payer: Self-pay | Admitting: Family Medicine

## 2018-01-11 NOTE — Telephone Encounter (Signed)
Please advise. Thanks.  

## 2018-01-13 ENCOUNTER — Other Ambulatory Visit: Payer: Self-pay | Admitting: Family Medicine

## 2018-01-14 ENCOUNTER — Other Ambulatory Visit: Payer: Self-pay | Admitting: Family Medicine

## 2018-01-17 ENCOUNTER — Other Ambulatory Visit: Payer: Self-pay | Admitting: Family Medicine

## 2018-01-19 ENCOUNTER — Other Ambulatory Visit: Payer: Self-pay | Admitting: Family Medicine

## 2018-01-26 DIAGNOSIS — M150 Primary generalized (osteo)arthritis: Secondary | ICD-10-CM | POA: Diagnosis not present

## 2018-01-26 DIAGNOSIS — M797 Fibromyalgia: Secondary | ICD-10-CM | POA: Diagnosis not present

## 2018-01-31 ENCOUNTER — Ambulatory Visit: Payer: Self-pay | Admitting: Family Medicine

## 2018-02-01 ENCOUNTER — Encounter: Payer: Self-pay | Admitting: Family Medicine

## 2018-02-01 ENCOUNTER — Ambulatory Visit (INDEPENDENT_AMBULATORY_CARE_PROVIDER_SITE_OTHER): Payer: Medicare Other | Admitting: Family Medicine

## 2018-02-01 ENCOUNTER — Encounter: Payer: Self-pay | Admitting: *Deleted

## 2018-02-01 VITALS — BP 97/65 | HR 74 | Resp 16 | Ht 66.0 in | Wt 159.0 lb

## 2018-02-01 DIAGNOSIS — Z23 Encounter for immunization: Secondary | ICD-10-CM

## 2018-02-01 DIAGNOSIS — I10 Essential (primary) hypertension: Secondary | ICD-10-CM | POA: Diagnosis not present

## 2018-02-01 DIAGNOSIS — F3177 Bipolar disorder, in partial remission, most recent episode mixed: Secondary | ICD-10-CM

## 2018-02-01 DIAGNOSIS — R7303 Prediabetes: Secondary | ICD-10-CM | POA: Diagnosis not present

## 2018-02-01 MED ORDER — CITALOPRAM HYDROBROMIDE 20 MG PO TABS: 20.0000 mg | ORAL_TABLET | Freq: Every day | ORAL | 1 refills | Status: DC

## 2018-02-01 NOTE — Progress Notes (Signed)
OFFICE VISIT  02/01/2018   CC:  Chief Complaint  Patient presents with  . Follow-up    [facility]   HPI:    Patient is a 67 y.o. Caucasian female who presents for 6 week f/u bipolar disorder, GAD, and HTN.  HTN: last visit her low bps had resolved off of her antihypertensives, so she had restarted coreg 12.5mg  qd, and the plan was to increase to 12.5mg  bid but hold off on restart of lisinopril at that time, monitor home bps. I had to restart lisinopril 10mg  qd b/c she called the office reporting elevated bp's. Systolics 85-462, avg 703J/00X.  HR 70s-80s.  When in pain it goes up to 381W systolic (last night it was her neck/shoulders pain).  Psych: last visit she had d/c'd her seroquel due to oversedation/malaise.  She was significantly improved on her lamictal 300 mg qd and xanax. A couple days after her most recent o/v she called requesting to be started on citalopram or prozac.  I did start citalopram 10mg  qd at that time (about 5-6 wks ago). She describes her mood as "non-motivated".  Not really signif sadness or depressed.  No SI or HI. Does not want to do the list of things she has to do during the day.  Irritability is not bad since she got off the opiate pain med.  Sleep has been good. Appetite: "OK", but she has no motivation to fix herself food. Gets enjoyment out of watching netflix, going to church/bible study, going to see a friend. She takes 1/2 to 1 xanax bid and says it does help anxiety.  Lavender oil is working some for her pain.      Past Medical History:  Diagnosis Date  . Alcoholism in remission [facility])    states last alcohol 11 yrs ago  . Anxiety   . Asymptomatic gallstones   . Bipolar 1 disorder ([place])    Admission for mania x 2 (most recent 11/2017)  . Cervical spondylosis without myelopathy 12/19/2013   MRI 02/2014: multilevel DDD/spondylosis, not much change compared to prior MRI.  Pt not in favor of invasive therapy for her neck as of 04/2014.  Marland Kitchen COPD (chronic  obstructive pulmonary disease) (HCC)    Moderate: anoro started 04/2017 by pulm, pt symptomatically improved and PFTs stable at f/u 06/2017--pulm f/u planned 1 yr.  . Endometritis 05/2017   possible; empiric tx with Flagyl by GYN.  . Environmental allergies   . Fibromyalgia   . GERD (gastroesophageal reflux disease)   . Herpes zoster 08/07/2017   L scapular region  . Hiatal hernia   . History of adenomatous polyp of colon 04/2008; 08/2014   No high grade dysplasia: recall 5 yrs ([name], [name])  . History of basal cell carcinoma excision    NOSE  . History of Helicobacter pylori infection 04/2008   +gastric biopsy (gastritis but no metaplasia, dysplasia, or malignancy identified)  . History of TIA (transient ischemic attack)    secondary to HELLP syndrome 1994 post c/s--  residual memory loss  . Hyperlipidemia    no meds in > a decade per pt.  Recommended statin 06/21/16.  Marland Kitchen Hypertension   . Internal hemorrhoids   . 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 04/2017.  . Memory loss    Abnl MRI brain and CT brain c/w chronic microvascular ischemia.  Repeat MRI brain 02/2014 stable (Dr. Mayra Reel with no plan to f/u with neuro as of 04/2014)  . Osteoporosis 05/2010   2012 'penia; 06/2015 'porosis:  prolia.  repeat DEXA 2 yrs.  As of 12/2016 pt going to discuss w/GYN whether or not to continue prolia.  DEXA 09/2017 T-score -2.2 femur neck.  . Pelvic floor dysfunction    [facility] ([name])  . Postmenopausal vaginal bleeding 05/2017   x 1 small episode; GYN attempted endo bx but unable to penetrate cervix due to severe cerv stenosis (due to postmenopausal state + hx of LEEP).  Endo u/s showed thin endo lining.  Per GYN---no evidence of endomet pathology on w/u---obs as of 07/2017.  Marland Kitchen Prediabetes 06/2016    Fasting gluc 105; HbA1c at that time was 6.0%.  A1c 6.2% 12/2017.  Marland Kitchen Recurrent kidney stones 08/2013   Left ureteral calculus: Perc nephr--cystoscopy w/ureteroscopy + laser for stone removal.  Residual asymptomatic left renal nephrolithiasis <2mm present post-procedure.  Right sided hydronephrosis-persistent (on u/s)--urol ordered CT to further eval 08/2016: 1.6 cm nonobstructing stone lower pole left kidney, no hydro, plan for PCN extraction ([facility])  . Shortness of breath   . Sprain of neck 12/19/2013  . TMJ (temporomandibular joint disorder)    USES  MOUTH GUARD  . Toe fracture, left 12/2017   2nd toe prox phalanx (immobilization, [name])  . Vitamin D deficiency 05/2011  . Weak urinary stream 07/2016   with elevated PVR and mild left hydronephrosis secondary to this ([facility] started flomax 0.4mg  qd for this).    Past Surgical History:  Procedure Laterality Date  . ANTERIOR CERVICAL DECOMP/DISCECTOMY FUSION  06-30-2009   C3 -- C5  AND EXPLORATION OF FUSION C5-7 W/  PLATE REMOVAL  . BUNIONECTOMY Left   . CERVICAL FUSION  2002   anterior C5 -- C7  . CERVICAL SPINE SURGERY  08-27-1999     C6 -- T1  LAMINECTOMY/  DISKECTOMY  . CESAREAN SECTION  1994  . COLONOSCOPY W/ POLYPECTOMY  04/2008;08/2014   2016 tubular adenoma x 1; +diverticulosis and int/ext hemorrhoids.  Recall 5 yrs  . CYSTOSCOPY WITH URETEROSCOPY AND STENT PLACEMENT Left 08/28/2013   Procedure: CYSTOSCOPY, RGP,  WITH URETEROSCOPY AND STENT REMOVAL;  Surgeon: [name], MD;  Location: [facility];  Service: Urology;  Laterality: Left;  . DEXA  09/2017   T-score -2.2 femur neck: improved compared to 06/2015.  Marland Kitchen ESOPHAGOGASTRODUODENOSCOPY  2010; 08/2014   +Candidal esophagitis; Mild chronic gastritis w/intestinal metaplasia--NEG H pylori, neg for eosinophilic esoph  . HOLMIUM LASER APPLICATION Left 2/40/9735   Procedure: HOLMIUM LASER APPLICATION;  Surgeon: [name], MD;  Location: [facility];  Service: Urology;  Laterality: Left;  . NEGATIVE SLEEP STUDY  04-01-2007  . NEPHROLITHOTOMY Left 08/05/2013   Procedure: NEPHROLITHOTOMY PERCUTANEOUS;  Surgeon: [name], MD;  Location: [facility];  Service: Urology;  Laterality: Left;  . TONSILLECTOMY AND ADENOIDECTOMY  1975  . TOTAL KNEE ARTHROPLASTY Right 2003  . ULTRASOUND EXAM,PELVIC COMPLETE ([facility] HX)  06/25/15   NORMAL (done by GYN)    Outpatient Medications Prior to Visit  Medication Sig Dispense Refill  . albuterol (PROAIR HFA) 108 (90 Base) MCG/ACT inhaler INHALE 2 PUFFS INTO THE LUNGS EVERY 4 (FOUR) HOURS AS NEEDED FOR WHEEZING OR SHORTNESS OF BREATH. 8.5 each 5  . ALPRAZolam (XANAX) 1 MG tablet Take 1 tablet (1 mg total) by mouth 3 (three) times daily. 90 tablet 0  .  ANORO ELLIPTA 62.5-25 MCG/INH AEPB TAKE 1 PUFF BY MOUTH EVERY DAY 60 each 3  . atorvastatin (LIPITOR) 20 MG tablet TAKE 1 TABLET (20 MG TOTAL) BY MOUTH DAILY. 30 tablet 5  . carvedilol (COREG) 25 MG tablet 1/2 tab po bid 30 tablet 6  . cyclobenzaprine (FLEXERIL) 10 MG tablet Take 1 tablet (10 mg total) by mouth 2 (two) times daily as needed for muscle spasms. 10 tablet 0  . doxycycline (VIBRAMYCIN) 50 MG capsule TAKE ONE CAPSULE BY MOUTH UP TO 2 TIMES A DAY  3  . famotidine (PEPCID) 40 MG tablet Take 1 tablet (40 mg total) by mouth daily. 30 tablet 11  . fluticasone (FLONASE) 50 MCG/ACT nasal spray SPRAY 2 SPRAYS INTO EACH NOSTRIL EVERY DAY 16 g 3  . lamoTRIgine (LAMICTAL) 100 MG tablet Take 3 tablets (300 mg total) by mouth at bedtime. 90 tablet 11  . lisinopril (PRINIVIL,ZESTRIL) 10 MG tablet Take 1 tablet (10 mg total) by mouth daily. 30 tablet 1  . montelukast (SINGULAIR) 10 MG tablet TAKE 1 TABLET (10 MG TOTAL) BY MOUTH EVERY EVENING. 30 tablet 12  . Multiple Vitamin (MULTI-VITAMINS) TABS Take by mouth.    . polyethylene glycol powder (GLYCOLAX/MIRALAX) powder TAKE 17 G BY MOUTH DAILY. 3162 g 0  . potassium citrate (UROCIT-K) 10 MEQ (1080 MG) SR  tablet Take 20 mEq by mouth 2 (two) times daily.  11  . Propylene Glycol (SYSTANE COMPLETE) 0.6 % SOLN 1 drop in each eye bid 5 mL 6  . tamsulosin (FLOMAX) 0.4 MG CAPS capsule Take 0.4 mg by mouth daily.  11  . zolpidem (AMBIEN) 10 MG tablet Take 10 mg by mouth at bedtime.  1  . citalopram (CELEXA) 10 MG tablet TAKE 1 TABLET BY MOUTH EVERY DAY 30 tablet 0   No facility-administered medications prior to visit.     Allergies  Allergen Reactions  . Ceftin [Cefuroxime Axetil] Anaphylaxis    Swelling of eyes and tongue  . Ciprofloxacin Anaphylaxis    Difficulty breathing and generalized swelling  . Fosamax [Alendronate Sodium] Diarrhea  . Morphine And Related Other (See Comments)    Hallucinations/ disoriented - pt stated, "Only Morphine" - 03/23/17  . Prednisone Other (See Comments)    Hyperactivity  . Seroquel [Quetiapine] Other (See Comments)    oversedation    ROS As per HPI  PE: Blood pressure 97/65, pulse 74, resp. rate 16, height 5\' 6"  (1.676 m), weight 159 lb (72.1 kg), SpO2 97 %. Wt Readings from Last 2 Encounters:  02/01/18 159 lb (72.1 kg)  12/22/17 155 lb (70.3 kg)    Gen: alert, oriented x 4, affect pleasant.  Lucid thinking and conversation noted. HEENT: PERRLA, EOMI but poor visual tracking. Neck: no LAD, mass, or thyromegaly. CV: RRR, no m/r/g LUNGS: CTA bilat, nonlabored. NEURO: no tremor or tics noted on observation.  Coordination intact. CN 2-12 grossly intact bilaterally, strength 5/5 in all extremeties.  No ataxia.   LABS:  Lab Results  Component Value Date   HGBA1C 6.2 12/11/2017   Lab Results  Component Value Date   WBC 8.2 11/20/2017   HGB 11.6 (L) 11/20/2017   HCT 33.7 (L) 11/20/2017   MCV 88.2 11/20/2017   PLT 325 11/20/2017     Chemistry      Component Value Date/Time   NA 133 (L) 11/29/2017 1143   K 4.4 11/29/2017 1143   CL 96 11/29/2017 1143   CO2 30 11/29/2017 1143   BUN 21 11/29/2017  1143   CREATININE 1.14 11/29/2017 1143    CREATININE 1.27 (H) 05/16/2016 1525      Component Value Date/Time   CALCIUM 10.4 11/29/2017 1143   ALKPHOS 51 11/19/2017 0039   AST 19 11/19/2017 0039   ALT 18 11/19/2017 0039   BILITOT 0.9 11/19/2017 0039     Lab Results  Component Value Date   TSH 1.766 11/16/2017   Lab Results  Component Value Date   CHOL 168 07/18/2017   HDL 66.20 07/18/2017   LDLCALC 85 07/18/2017   LDLDIRECT 144.1 05/29/2013   TRIG 86.0 07/18/2017   CHOLHDL 3 07/18/2017    IMPRESSION AND PLAN:  1) HTN: her bp is in good range consistently now.  No changes at this time.  2) Bipolar d/o: she is out of manic phase, not really significantly depressed--but is "amotivational" and currently experiencing foggy cognition associated with her fibromyalgia. Will increase citalopram to 20mg  qd.  Continue lamictal 300 mg qd. Continue alprazolam and ambien---she states that her rheumatologist gave her a rx for xanax and ambien at a recent visit (?weeks ago?), so she does not need this med at this time. Controlled substance contract reviewed with patient today.  Patient signed this and it will be placed in the chart.   Will do UDS at next f/u visit.  3) Prediabetes: emphasized need to work on [place].  Recheck hba1c after 03/12/18.  Spent 25 min with pt today, with >50% of this time spent in counseling and care coordination regarding the above problems.  An After Visit Summary was printed and given to the patient.  FOLLOW UP: Return in about 4 weeks (around 03/01/2018) for routine chronic illness f/u.   Signed:  Crissie Sickles, MD           02/01/2018

## 2018-02-01 NOTE — Addendum Note (Signed)
Addended by: Gordy Councilman on: 02/01/2018 02:16 PM   Modules accepted: Orders

## 2018-02-05 ENCOUNTER — Other Ambulatory Visit: Payer: Self-pay | Admitting: Adult Health

## 2018-02-09 ENCOUNTER — Telehealth: Payer: Self-pay | Admitting: Family Medicine

## 2018-02-09 ENCOUNTER — Ambulatory Visit: Payer: Self-pay

## 2018-02-09 NOTE — Telephone Encounter (Signed)
Copied from [place] [id number]. Topic: General - Other >> Feb 09, 2018  3:51 PM Leward Quan A wrote: Reason for CRM: Patient called request a call back with questions about citalopram (CELEXA) 20 MG tablet states she is having side effects. Redness of face in the morning, more anxiety and also high blood pressure warning and Bipolar warning on medication guide Ph#  [phone]

## 2018-02-09 NOTE — Telephone Encounter (Signed)
Patient called in with c/o "side effect from Celexa." She says "I increased the dosage to 2 tablets on Monday and since then I wake up with facial redness to my cheeks. The redness eventually fades away as the day goes on. There was no redness when I was taking 1 pill, but I increased it because 1 pill was not helping my mood and I really can't tell with 2 pills either. Also as I'm reading more on the medicine, there is a high blood pressure warning and it mentions Bipolar. I'm not sure about continuing to take this medicine. I would like [name] to let me know what I need to do." I asked about scheduling an appointment for next week, she says "I believe this is something that can be handled over the phone." I advised I will send this over to the office and someone will call with his recommendation, she verbalized understanding. She says "if I have to go back on the Seroquel, I guess that's an option." 4 week follow up scheduled for Wednesday, 02/28/18 at 1045.   Reason for Disposition . Caller has NON-URGENT medication question about med that PCP prescribed and triager unable to answer question  Answer Assessment - Initial Assessment Questions 1. SYMPTOMS: "Do you have any symptoms?"     Facial redness in the mornings; take Celexa at night 2. SEVERITY: If symptoms are present, ask "Are they mild, moderate or severe?"    Mild/Moderate redness to cheeks  Protocols used: MEDICATION QUESTION CALL-A-AH

## 2018-02-09 NOTE — Telephone Encounter (Signed)
See triage encounter.

## 2018-02-11 ENCOUNTER — Other Ambulatory Visit: Payer: Self-pay | Admitting: Family Medicine

## 2018-02-12 NOTE — Telephone Encounter (Signed)
She should only take ONE of the citalopram per day at this time. If she continues to get facial flushing, she should check her blood pressure at that time if possible. Reassure.   If depression is getting worse since citalopram was added then she needs to come in for o/v prior to the one scheduled for 02/28/18.-thx

## 2018-02-12 NOTE — Telephone Encounter (Signed)
Left message on machine for patient to return call. Okay for pec nurse to give information in note.

## 2018-02-12 NOTE — Telephone Encounter (Signed)
Patient notified of provider response- she will try backing down to the 1/day. If she still does not feel that the medication is helping and the flushing is still present - she will call for appointment before her follow up. Patient understands instructions.

## 2018-02-13 ENCOUNTER — Encounter: Payer: Self-pay | Admitting: Family Medicine

## 2018-02-14 ENCOUNTER — Encounter: Payer: Self-pay | Admitting: Family Medicine

## 2018-02-14 MED ORDER — ZOLPIDEM TARTRATE 10 MG PO TABS: 10.0000 mg | ORAL_TABLET | Freq: Every day | ORAL | 1 refills | Status: DC

## 2018-02-14 NOTE — Telephone Encounter (Signed)
ambien eRx'd.

## 2018-02-14 NOTE — Telephone Encounter (Signed)
RF request for ambien LOV: 02/01/18 Next ov: 02/28/18 Last written: N/A  This Rx has not been Rx'ed by [name].  Please advise. Thanks.

## 2018-02-19 ENCOUNTER — Other Ambulatory Visit: Payer: Self-pay | Admitting: Family Medicine

## 2018-02-25 ENCOUNTER — Other Ambulatory Visit: Payer: Self-pay | Admitting: Family Medicine

## 2018-02-28 ENCOUNTER — Encounter: Payer: Self-pay | Admitting: Family Medicine

## 2018-02-28 ENCOUNTER — Ambulatory Visit: Payer: Medicare Other | Admitting: Family Medicine

## 2018-02-28 VITALS — BP 101/67 | HR 78 | Temp 98.1°F | Resp 16 | Wt 165.0 lb

## 2018-02-28 DIAGNOSIS — F3177 Bipolar disorder, in partial remission, most recent episode mixed: Secondary | ICD-10-CM

## 2018-02-28 MED ORDER — CITALOPRAM HYDROBROMIDE 40 MG PO TABS: 40.0000 mg | ORAL_TABLET | Freq: Every day | ORAL | 1 refills | Status: DC

## 2018-02-28 NOTE — Progress Notes (Signed)
OFFICE VISIT  02/28/2018   CC:  Chief Complaint  Patient presents with  . Follow-up   HPI:    Patient is a 67 y.o. Caucasian female who presents for 1 mo f/u bipolar disorder, GAD, and HTN.  Mood: last visit we increased her citalopram to 20mg  qd--> she was essentially undergoing a boredom/amotivation spell that comes after a period of hypomania.  Kept her lamictal the same. Her anxiety was fairly well controlled on bid xanax.  HTN: her bp was well controlled last f/u after we had titrated her coreg and lisinopril.  Interim Hx: Some mood fluctuations due to some life situations; had to put her dog down and then has had some arguments with her daughter.  She still finds herself w/out motivation, finds it difficult to get started on any task.  Hard to say whether or not she is improved on citalopram per her report today.   Sleep: improved.   She is listening to good music and going to [employer] and trying to be around people.  This is hard for her to keep up, though.    Home bp's consistently < 130/80. No pain lately.       Past Medical History:  Diagnosis Date  . Alcoholism in remission [facility])    states last alcohol 11 yrs ago  . Anxiety   . Asymptomatic gallstones   . Bipolar 1 disorder ([place])    Admission for mania x 2 (most recent 11/2017)  . Cervical spondylosis without myelopathy 12/19/2013   MRI 02/2014: multilevel DDD/spondylosis, not much change compared to prior MRI.  Pt not in favor of invasive therapy for her neck as of 04/2014.  Marland Kitchen COPD (chronic obstructive pulmonary disease) (HCC)    Moderate: anoro started 04/2017 by pulm, pt symptomatically improved and PFTs stable at f/u 06/2017--pulm f/u planned 1 yr.  . Endometritis 05/2017   possible; empiric tx with Flagyl by GYN.  . Environmental allergies   . Fibromyalgia   . GERD (gastroesophageal reflux disease)   . Herpes zoster 08/07/2017   L scapular region  . Hiatal hernia   . History of adenomatous polyp of  colon 04/2008; 08/2014   No high grade dysplasia: recall 5 yrs ([name], [name])  . History of basal cell carcinoma excision    NOSE  . History of Helicobacter pylori infection 04/2008   +gastric biopsy (gastritis but no metaplasia, dysplasia, or malignancy identified)  . History of TIA (transient ischemic attack)    secondary to HELLP syndrome 1994 post c/s--  residual memory loss  . Hyperlipidemia    no meds in > a decade per pt.  Recommended statin 06/21/16.  Marland Kitchen Hypertension   . Internal hemorrhoids   . 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 04/2017.  . Memory loss    Abnl MRI brain and CT brain c/w chronic microvascular ischemia.  Repeat MRI brain 02/2014 stable (Dr. Mayra Reel with no plan to f/u with neuro as of 04/2014)  . Osteoporosis 05/2010   2012 'penia; 06/2015 'porosis:  prolia.  repeat DEXA 2 yrs.  As of 12/2016 pt going to discuss w/GYN whether or not to continue prolia.  DEXA 09/2017 T-score -2.2 femur neck.  . Pelvic floor dysfunction    [facility] ([name])  . Postmenopausal vaginal bleeding 05/2017   x 1 small episode; GYN attempted  endo bx but unable to penetrate cervix due to severe cerv stenosis (due to postmenopausal state + hx of LEEP).  Endo u/s showed thin endo lining.  Per GYN---no evidence of endomet pathology on w/u---obs as of 07/2017.  Marland Kitchen Prediabetes 06/2016   Fasting gluc 105; HbA1c at that time was 6.0%.  A1c 6.2% 12/2017.  Marland Kitchen Recurrent kidney stones 08/2013   Left ureteral calculus: Perc nephr--cystoscopy w/ureteroscopy + laser for stone removal.  Residual asymptomatic left renal nephrolithiasis <67mm present post-procedure.  Right sided hydronephrosis-persistent (on u/s)--urol ordered CT to further eval 08/2016: 1.6 cm nonobstructing stone lower pole left kidney, no hydro,  plan for PCN extraction ([facility])  . Shortness of breath   . Sprain of neck 12/19/2013  . TMJ (temporomandibular joint disorder)    USES  MOUTH GUARD  . Toe fracture, left 12/2017   2nd toe prox phalanx (immobilization, [name])  . Vitamin D deficiency 05/2011  . Weak urinary stream 07/2016   with elevated PVR and mild left hydronephrosis secondary to this ([facility] started flomax 0.4mg  qd for this).    Past Surgical History:  Procedure Laterality Date  . ANTERIOR CERVICAL DECOMP/DISCECTOMY FUSION  06-30-2009   C3 -- C5  AND EXPLORATION OF FUSION C5-7 W/  PLATE REMOVAL  . BUNIONECTOMY Left   . CERVICAL FUSION  2002   anterior C5 -- C7  . CERVICAL SPINE SURGERY  08-27-1999     C6 -- T1  LAMINECTOMY/  DISKECTOMY  . CESAREAN SECTION  1994  . COLONOSCOPY W/ POLYPECTOMY  04/2008;08/2014   2016 tubular adenoma x 1; +diverticulosis and int/ext hemorrhoids.  Recall 5 yrs  . CYSTOSCOPY WITH URETEROSCOPY AND STENT PLACEMENT Left 08/28/2013   Procedure: CYSTOSCOPY, RGP,  WITH URETEROSCOPY AND STENT REMOVAL;  Surgeon: [name], MD;  Location: [facility];  Service: Urology;  Laterality: Left;  . DEXA  09/2017   T-score -2.2 femur neck: improved compared to 06/2015.  Marland Kitchen ESOPHAGOGASTRODUODENOSCOPY  2010; 08/2014   +Candidal esophagitis; Mild chronic gastritis w/intestinal metaplasia--NEG H pylori, neg for eosinophilic esoph  . HOLMIUM LASER APPLICATION Left 7/62/8315   Procedure: HOLMIUM LASER APPLICATION;  Surgeon: [name], MD;  Location: [facility];  Service: Urology;  Laterality: Left;  . NEGATIVE SLEEP STUDY  04-01-2007  . NEPHROLITHOTOMY Left 08/05/2013   Procedure: NEPHROLITHOTOMY PERCUTANEOUS;  Surgeon: [name], MD;  Location: [facility];  Service: Urology;  Laterality: Left;  . TONSILLECTOMY AND ADENOIDECTOMY  1975  . TOTAL KNEE ARTHROPLASTY Right 2003  . ULTRASOUND EXAM,PELVIC COMPLETE ([facility] HX)  06/25/15   NORMAL (done by GYN)     Outpatient Medications Prior to Visit  Medication Sig Dispense Refill  . albuterol (PROAIR HFA) 108 (90 Base) MCG/ACT inhaler INHALE 2 PUFFS INTO THE LUNGS EVERY 4 (FOUR) HOURS AS NEEDED FOR WHEEZING OR SHORTNESS OF BREATH. 8.5 each 5  . ALPRAZolam (XANAX) 1 MG tablet Take 1 tablet (1 mg total) by mouth 3 (three) times daily. 90 tablet 0  . ANORO ELLIPTA 62.5-25 MCG/INH AEPB TAKE 1 PUFF BY MOUTH EVERY DAY 60 each 5  . atorvastatin (LIPITOR) 20 MG tablet TAKE 1 TABLET (20 MG TOTAL) BY MOUTH DAILY. 30 tablet 5  . carvedilol (COREG) 25 MG tablet 1/2 tab po bid 30 tablet 6  . cyclobenzaprine (FLEXERIL) 10 MG tablet Take 1 tablet (10 mg total) by mouth 2 (two) times daily as needed for muscle spasms. 10 tablet 0  . doxycycline (VIBRAMYCIN) 50 MG  capsule TAKE ONE CAPSULE BY MOUTH UP TO 2 TIMES A DAY  3  . famotidine (PEPCID) 40 MG tablet Take 1 tablet (40 mg total) by mouth daily. 30 tablet 11  . fluticasone (FLONASE) 50 MCG/ACT nasal spray SPRAY 2 SPRAYS INTO EACH NOSTRIL EVERY DAY 48 g 1  . lamoTRIgine (LAMICTAL) 100 MG tablet Take 3 tablets (300 mg total) by mouth at bedtime. 90 tablet 11  . lisinopril (PRINIVIL,ZESTRIL) 10 MG tablet TAKE 1 TABLET BY MOUTH EVERY DAY 30 tablet 0  . montelukast (SINGULAIR) 10 MG tablet TAKE 1 TABLET (10 MG TOTAL) BY MOUTH EVERY EVENING. 30 tablet 12  . Multiple Vitamin (MULTI-VITAMINS) TABS Take by mouth.    . polyethylene glycol powder (GLYCOLAX/MIRALAX) powder TAKE 17 G BY MOUTH DAILY. 3162 g 0  . potassium citrate (UROCIT-K) 10 MEQ (1080 MG) SR tablet Take 20 mEq by mouth 2 (two) times daily.  11  . Propylene Glycol (SYSTANE COMPLETE) 0.6 % SOLN 1 drop in each eye bid 5 mL 6  . tamsulosin (FLOMAX) 0.4 MG CAPS capsule Take 0.4 mg by mouth daily.  11  . zolpidem (AMBIEN) 10 MG tablet Take 1 tablet (10 mg total) by mouth at bedtime. 90 tablet 1  . citalopram (CELEXA) 20 MG tablet TAKE 1 TABLET BY MOUTH EVERY DAY 90 tablet 1   No facility-administered  medications prior to visit.     Allergies  Allergen Reactions  . Ceftin [Cefuroxime Axetil] Anaphylaxis    Swelling of eyes and tongue  . Ciprofloxacin Anaphylaxis    Difficulty breathing and generalized swelling  . Fosamax [Alendronate Sodium] Diarrhea  . Morphine And Related Other (See Comments)    Hallucinations/ disoriented - pt stated, "Only Morphine" - 03/23/17  . Prednisone Other (See Comments)    Hyperactivity  . Seroquel [Quetiapine] Other (See Comments)    oversedation    ROS As per HPI  PE: Blood pressure 101/67, pulse 78, temperature 98.1 F (36.7 C), temperature source Oral, resp. rate 16, weight 165 lb (74.8 kg). Gen: Alert, well appearing.  Patient is oriented to person, place, time, and situation. AFFECT: pleasant, lucid thought and speech. No further exam today.  LABS:    Chemistry      Component Value Date/Time   NA 133 (L) 11/29/2017 1143   K 4.4 11/29/2017 1143   CL 96 11/29/2017 1143   CO2 30 11/29/2017 1143   BUN 21 11/29/2017 1143   CREATININE 1.14 11/29/2017 1143   CREATININE 1.27 (H) 05/16/2016 1525      Component Value Date/Time   CALCIUM 10.4 11/29/2017 1143   ALKPHOS 51 11/19/2017 0039   AST 19 11/19/2017 0039   ALT 18 11/19/2017 0039   BILITOT 0.9 11/19/2017 0039      IMPRESSION AND PLAN:  Bipolar d/o with GAD. Certainly is not worse.  In fact, I think she is improving gradually. Will increase her citalopram to 40 mg qd and continue 300 mg lamotrigine and alprazolam 1mg  tid. Of note, her RFs of alprazolam had Dr. Barkley Boards, her rheum MD's name, on it.  She did not know the pharmacy was sending automatic RF on this rx (she says she had told them to take Dr. Carmela Rima name off as a rx'er of this med).  She mentions this to make sure we are on the same page and she is not in violation of her [place] that she signed last visit.   An After Visit Summary was printed and given to the patient.  FOLLOW UP: Return in about 4 weeks (around  03/28/2018) for f/u mood/anxiety.  Signed:  Crissie Sickles, MD           02/28/2018

## 2018-03-06 ENCOUNTER — Other Ambulatory Visit: Payer: Self-pay | Admitting: Family Medicine

## 2018-03-08 ENCOUNTER — Encounter: Payer: Self-pay | Admitting: Family Medicine

## 2018-03-08 MED ORDER — CYCLOBENZAPRINE HCL 10 MG PO TABS: 10.0000 mg | ORAL_TABLET | Freq: Two times a day (BID) | ORAL | 0 refills | Status: DC | PRN

## 2018-03-08 NOTE — Telephone Encounter (Signed)
I think this medication was started when pt was in the hospital. Please advise. Thanks.

## 2018-03-25 ENCOUNTER — Other Ambulatory Visit: Payer: Self-pay | Admitting: Family Medicine

## 2018-03-26 ENCOUNTER — Other Ambulatory Visit: Payer: Self-pay | Admitting: Family Medicine

## 2018-03-26 ENCOUNTER — Encounter: Payer: Self-pay | Admitting: Family Medicine

## 2018-03-26 MED ORDER — ALPRAZOLAM 1 MG PO TABS: 1.0000 mg | ORAL_TABLET | Freq: Three times a day (TID) | ORAL | 1 refills | Status: DC

## 2018-03-26 NOTE — Telephone Encounter (Signed)
RF request for alprazolam LOV: 02/28/18 Next ov: 03/30/18 Last written: 11/29/17 #90 w/ 0RF  Please advise. Thanks.

## 2018-03-26 NOTE — Telephone Encounter (Signed)
RF request for cyclobenzaprine LOV: 02/28/18 Next ov: 03/30/18 Last written: 03/08/18 #30 w/ 0RF  Please advise. Thanks.

## 2018-03-30 ENCOUNTER — Ambulatory Visit: Payer: Medicare Other | Admitting: Family Medicine

## 2018-03-30 ENCOUNTER — Encounter: Payer: Self-pay | Admitting: Family Medicine

## 2018-03-30 VITALS — BP 98/63 | HR 64 | Temp 98.3°F | Resp 16 | Ht 66.0 in | Wt 168.1 lb

## 2018-03-30 DIAGNOSIS — N183 Chronic kidney disease, stage 3 unspecified: Secondary | ICD-10-CM

## 2018-03-30 DIAGNOSIS — Z23 Encounter for immunization: Secondary | ICD-10-CM | POA: Diagnosis not present

## 2018-03-30 DIAGNOSIS — E663 Overweight: Secondary | ICD-10-CM | POA: Diagnosis not present

## 2018-03-30 DIAGNOSIS — F411 Generalized anxiety disorder: Secondary | ICD-10-CM

## 2018-03-30 DIAGNOSIS — F3160 Bipolar disorder, current episode mixed, unspecified: Secondary | ICD-10-CM

## 2018-03-30 DIAGNOSIS — E871 Hypo-osmolality and hyponatremia: Secondary | ICD-10-CM

## 2018-03-30 LAB — BASIC METABOLIC PANEL
BUN: 15 mg/dL (ref 6–23)
CHLORIDE: 92 meq/L — AB (ref 96–112)
CO2: 31 meq/L (ref 19–32)
Calcium: 9.6 mg/dL (ref 8.4–10.5)
Creatinine, Ser: 0.93 mg/dL (ref 0.40–1.20)
GFR: 64.26 mL/min (ref >60.00)
GLUCOSE: 85 mg/dL (ref 70–99)
Potassium: 3.9 mEq/L (ref 3.5–5.1)
SODIUM: 130 meq/L — AB (ref 135–145)

## 2018-03-30 NOTE — Progress Notes (Signed)
OFFICE VISIT  03/30/2018   CC:  Chief Complaint  Patient presents with  . Follow-up    mood/anxiety  Not fasting.  HPI:    Patient is a 67 y.o. Caucasian female who presents for 1 mo f/u bipolar d/o (mixed) and chronic anxiety, also CRI with GFR 50s. Last visit's A/P: she was improving gradually-->ncrease her citalopram to 40 mg qd and continue 300 mg lamotrigine and alprazolam 1mg  tid.  Interim hx: Still gradually improving regarding her mood and anxiety.  Working on pushing through her anxiety and negative thinking.  Having some philosophical times more lately. She is trying to avoid catastrophizing so much.  Still attending church. Tolerating meds fine.   She is trying to stay appropriately hydrated but NOT OVERINGEST water.  No new complaints.  Past Medical History:  Diagnosis Date  . Alcoholism in remission [facility])    states last alcohol 11 yrs ago  . Anxiety   . Asymptomatic gallstones   . Bipolar 1 disorder ([place])    Admission for mania x 2 (most recent 11/2017)  . Cervical spondylosis without myelopathy 12/19/2013   MRI 02/2014: multilevel DDD/spondylosis, not much change compared to prior MRI.  Pt not in favor of invasive therapy for her neck as of 04/2014.  Marland Kitchen COPD (chronic obstructive pulmonary disease) (HCC)    Moderate: anoro started 04/2017 by pulm, pt symptomatically improved and PFTs stable at f/u 06/2017--pulm f/u planned 1 yr.  . Endometritis 05/2017   possible; empiric tx with Flagyl by GYN.  . Environmental allergies   . Fibromyalgia   . GERD (gastroesophageal reflux disease)   . Herpes zoster 08/07/2017   L scapular region  . Hiatal hernia   . History of adenomatous polyp of colon 04/2008; 08/2014   No high grade dysplasia: recall 5 yrs ([name], [name])  . History of basal cell carcinoma excision    NOSE  . History of Helicobacter pylori infection 04/2008   +gastric biopsy (gastritis but no metaplasia, dysplasia, or malignancy identified)  . History  of TIA (transient ischemic attack)    secondary to HELLP syndrome 1994 post c/s--  residual memory loss  . Hyperlipidemia    no meds in > a decade per pt.  Recommended statin 06/21/16.  Marland Kitchen Hypertension   . Internal hemorrhoids   . 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 04/2017.  . Memory loss    Abnl MRI brain and CT brain c/w chronic microvascular ischemia.  Repeat MRI brain 02/2014 stable (Dr. Mayra Reel with no plan to f/u with neuro as of 04/2014)  . Osteoporosis 05/2010   2012 'penia; 06/2015 'porosis:  prolia.  repeat DEXA 2 yrs.  As of 12/2016 pt going to discuss w/GYN whether or not to continue prolia.  DEXA 09/2017 T-score -2.2 femur neck.  . Pelvic floor dysfunction    [facility] ([name])  . Postmenopausal vaginal bleeding 05/2017   x 1 small episode; GYN attempted endo bx but unable to penetrate cervix due to severe cerv stenosis (due to postmenopausal state + hx of LEEP).  Endo u/s showed thin endo lining.  Per GYN---no evidence of endomet pathology on w/u---obs as of 07/2017.  Marland Kitchen Prediabetes 06/2016   Fasting gluc 105; HbA1c at that time was 6.0%.  A1c 6.2% 12/2017.  Marland Kitchen Recurrent kidney stones 08/2013   Left ureteral calculus: Perc  nephr--cystoscopy w/ureteroscopy + laser for stone removal.  Residual asymptomatic left renal nephrolithiasis <27mm present post-procedure.  Right sided hydronephrosis-persistent (on u/s)--urol ordered CT to further eval 08/2016: 1.6 cm nonobstructing stone lower pole left kidney, no hydro, plan for PCN extraction ([facility])  . Shortness of breath   . Sprain of neck 12/19/2013  . TMJ (temporomandibular joint disorder)    USES  MOUTH GUARD  . Toe fracture, left 12/2017   2nd toe prox phalanx (immobilization, [name])  . Vitamin D deficiency 05/2011  . Weak urinary stream  07/2016   with elevated PVR and mild left hydronephrosis secondary to this ([facility] started flomax 0.4mg  qd for this).    Past Surgical History:  Procedure Laterality Date  . ANTERIOR CERVICAL DECOMP/DISCECTOMY FUSION  06-30-2009   C3 -- C5  AND EXPLORATION OF FUSION C5-7 W/  PLATE REMOVAL  . BUNIONECTOMY Left   . CERVICAL FUSION  2002   anterior C5 -- C7  . CERVICAL SPINE SURGERY  08-27-1999     C6 -- T1  LAMINECTOMY/  DISKECTOMY  . CESAREAN SECTION  1994  . COLONOSCOPY W/ POLYPECTOMY  04/2008;08/2014   2016 tubular adenoma x 1; +diverticulosis and int/ext hemorrhoids.  Recall 5 yrs  . CYSTOSCOPY WITH URETEROSCOPY AND STENT PLACEMENT Left 08/28/2013   Procedure: CYSTOSCOPY, RGP,  WITH URETEROSCOPY AND STENT REMOVAL;  Surgeon: [name], MD;  Location: [facility];  Service: Urology;  Laterality: Left;  . DEXA  09/2017   T-score -2.2 femur neck: improved compared to 06/2015.  Marland Kitchen ESOPHAGOGASTRODUODENOSCOPY  2010; 08/2014   +Candidal esophagitis; Mild chronic gastritis w/intestinal metaplasia--NEG H pylori, neg for eosinophilic esoph  . HOLMIUM LASER APPLICATION Left 09/23/2977   Procedure: HOLMIUM LASER APPLICATION;  Surgeon: [name], MD;  Location: [facility];  Service: Urology;  Laterality: Left;  . NEGATIVE SLEEP STUDY  04-01-2007  . NEPHROLITHOTOMY Left 08/05/2013   Procedure: NEPHROLITHOTOMY PERCUTANEOUS;  Surgeon: [name], MD;  [facility];  Service: Urology;  Laterality: Left;  . TONSILLECTOMY AND ADENOIDECTOMY  1975  . TOTAL KNEE ARTHROPLASTY Right 2003  . ULTRASOUND EXAM,PELVIC COMPLETE ([facility] HX)  06/25/15   NORMAL (done by GYN)    Outpatient Medications Prior to Visit  Medication Sig Dispense Refill  . albuterol (PROAIR HFA) 108 (90 Base) MCG/ACT inhaler INHALE 2 PUFFS INTO THE LUNGS EVERY 4 (FOUR) HOURS AS NEEDED FOR WHEEZING OR SHORTNESS OF BREATH. 8.5 each 5  . ALPRAZolam (XANAX) 1 MG tablet Take 1 tablet (1 mg  total) by mouth 3 (three) times daily. 270 tablet 1  . ANORO ELLIPTA 62.5-25 MCG/INH AEPB TAKE 1 PUFF BY MOUTH EVERY DAY 60 each 5  . atorvastatin (LIPITOR) 20 MG tablet TAKE 1 TABLET (20 MG TOTAL) BY MOUTH DAILY. 30 tablet 5  . carvedilol (COREG) 25 MG tablet 1/2 tab po bid 30 tablet 6  . citalopram (CELEXA) 40 MG tablet Take 1 tablet (40 mg total) by mouth daily. 30 tablet 1  . cyclobenzaprine (FLEXERIL) 10 MG tablet TAKE 1 TABLET BY MOUTH TWICE A DAY AS NEEDED FOR MUSCLE SPASMS 30 tablet 1  . doxycycline (VIBRAMYCIN) 50 MG capsule TAKE ONE CAPSULE BY MOUTH UP TO 2 TIMES A DAY  3  . famotidine (PEPCID) 40 MG tablet Take 1 tablet (40 mg total) by mouth daily. 30 tablet 11  . fluticasone (FLONASE) 50 MCG/ACT nasal spray SPRAY 2 SPRAYS INTO EACH NOSTRIL EVERY DAY 48 g 1  . lamoTRIgine (  LAMICTAL) 100 MG tablet Take 3 tablets (300 mg total) by mouth at bedtime. 90 tablet 11  . lisinopril (PRINIVIL,ZESTRIL) 10 MG tablet TAKE 1 TABLET BY MOUTH EVERY DAY 30 tablet 5  . montelukast (SINGULAIR) 10 MG tablet TAKE 1 TABLET (10 MG TOTAL) BY MOUTH EVERY EVENING. 30 tablet 12  . Multiple Vitamin (MULTI-VITAMINS) TABS Take by mouth.    . polyethylene glycol powder (GLYCOLAX/MIRALAX) powder TAKE 17 G BY MOUTH DAILY. 3162 g 0  . Propylene Glycol (SYSTANE COMPLETE) 0.6 % SOLN 1 drop in each eye bid 5 mL 6  . zolpidem (AMBIEN) 10 MG tablet Take 1 tablet (10 mg total) by mouth at bedtime. 90 tablet 1  . potassium citrate (UROCIT-K) 10 MEQ (1080 MG) SR tablet Take 20 mEq by mouth 2 (two) times daily.  11  . tamsulosin (FLOMAX) 0.4 MG CAPS capsule Take 0.4 mg by mouth daily.  11   No facility-administered medications prior to visit.     Allergies  Allergen Reactions  . Ceftin [Cefuroxime Axetil] Anaphylaxis    Swelling of eyes and tongue  . Ciprofloxacin Anaphylaxis    Difficulty breathing and generalized swelling  . Fosamax [Alendronate Sodium] Diarrhea  . Morphine And Related Other (See Comments)     Hallucinations/ disoriented - pt stated, "Only Morphine" - 03/23/17  . Prednisone Other (See Comments)    Hyperactivity  . Seroquel [Quetiapine] Other (See Comments)    oversedation    ROS As per HPI  PE: Blood pressure 98/63, pulse 64, temperature 98.3 F (36.8 C), temperature source Oral, resp. rate 16, height 5\' 6"  (1.676 m), weight 168 lb 2 oz (76.3 kg), SpO2 100 %. Gen: Alert, well appearing.  Patient is oriented to person, place, time, and situation. AFFECT: pleasant, lucid thought and speech. No further exam today.  LABS:    Chemistry      Component Value Date/Time   NA 133 (L) 11/29/2017 1143   K 4.4 11/29/2017 1143   CL 96 11/29/2017 1143   CO2 30 11/29/2017 1143   BUN 21 11/29/2017 1143   CREATININE 1.14 11/29/2017 1143   CREATININE 1.27 (H) 05/16/2016 1525      Component Value Date/Time   CALCIUM 10.4 11/29/2017 1143   ALKPHOS 51 11/19/2017 0039   AST 19 11/19/2017 0039   ALT 18 11/19/2017 0039   BILITOT 0.9 11/19/2017 0039      IMPRESSION AND PLAN:  1) Bipolar d/o and GAD: improving appropriately. The current medical regimen is effective;  continue present plan and medications. Will do UDS at next f/u in 4 mo.  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 Has been off of potassium supplement---rechecking BMET today to see if needs to restart (was on 20 mEQ bid).  3) Hx of hyponatremia: from overingestion of water.  BMET today.  An After Visit Summary was printed and given to the patient.  FOLLOW UP: Return in about 4 months (around 07/30/2018) for annual CPE (fasting).  Signed:  Crissie Sickles, MD           03/30/2018

## 2018-04-02 DIAGNOSIS — L820 Inflamed seborrheic keratosis: Secondary | ICD-10-CM | POA: Diagnosis not present

## 2018-04-02 DIAGNOSIS — L298 Other pruritus: Secondary | ICD-10-CM | POA: Diagnosis not present

## 2018-04-02 DIAGNOSIS — L821 Other seborrheic keratosis: Secondary | ICD-10-CM | POA: Diagnosis not present

## 2018-04-02 DIAGNOSIS — D225 Melanocytic nevi of trunk: Secondary | ICD-10-CM | POA: Diagnosis not present

## 2018-04-22 ENCOUNTER — Other Ambulatory Visit: Payer: Self-pay | Admitting: Family Medicine

## 2018-04-30 DIAGNOSIS — L821 Other seborrheic keratosis: Secondary | ICD-10-CM | POA: Diagnosis not present

## 2018-04-30 DIAGNOSIS — L820 Inflamed seborrheic keratosis: Secondary | ICD-10-CM | POA: Diagnosis not present

## 2018-05-02 ENCOUNTER — Other Ambulatory Visit: Payer: Self-pay | Admitting: Family Medicine

## 2018-05-05 DIAGNOSIS — I214 Non-ST elevation (NSTEMI) myocardial infarction: Secondary | ICD-10-CM

## 2018-05-05 DIAGNOSIS — I255 Ischemic cardiomyopathy: Secondary | ICD-10-CM

## 2018-05-05 HISTORY — PX: TRANSTHORACIC ECHOCARDIOGRAM: SHX275

## 2018-05-05 HISTORY — DX: Non-ST elevation (NSTEMI) myocardial infarction: I21.4

## 2018-05-05 HISTORY — DX: Ischemic cardiomyopathy: I25.5

## 2018-05-07 ENCOUNTER — Encounter: Payer: Self-pay | Admitting: Family Medicine

## 2018-05-07 NOTE — Telephone Encounter (Signed)
Pt requesting new Rx for flexeril qty 60 tab since pt takes it BID.  I have sent a message to pt about all her other concerns.

## 2018-05-08 MED ORDER — ZOSTER VAC RECOMB ADJUVANTED 50 MCG/0.5ML IM SUSR: 0.5000 mL | Freq: Once | INTRAMUSCULAR | 1 refills | Status: AC

## 2018-05-08 MED ORDER — CYCLOBENZAPRINE HCL 10 MG PO TABS: ORAL_TABLET | ORAL | 5 refills | Status: DC

## 2018-05-12 ENCOUNTER — Other Ambulatory Visit: Payer: Self-pay | Admitting: Family Medicine

## 2018-05-14 ENCOUNTER — Encounter (HOSPITAL_COMMUNITY): Payer: Self-pay

## 2018-05-14 ENCOUNTER — Emergency Department (HOSPITAL_COMMUNITY): Payer: Medicare Other

## 2018-05-14 ENCOUNTER — Other Ambulatory Visit: Payer: Self-pay

## 2018-05-14 ENCOUNTER — Inpatient Hospital Stay (HOSPITAL_COMMUNITY)
Admission: EM | Admit: 2018-05-14 | Discharge: 2018-05-16 | DRG: 281 | Disposition: A | Discharge status: Home / Self Care | Payer: Medicare Other | Attending: Cardiology | Admitting: Cardiology

## 2018-05-14 ENCOUNTER — Encounter: Payer: Self-pay | Admitting: Family Medicine

## 2018-05-14 DIAGNOSIS — Z792 Long term (current) use of antibiotics: Secondary | ICD-10-CM

## 2018-05-14 DIAGNOSIS — J449 Chronic obstructive pulmonary disease, unspecified: Secondary | ICD-10-CM | POA: Diagnosis present

## 2018-05-14 DIAGNOSIS — Z8042 Family history of malignant neoplasm of prostate: Secondary | ICD-10-CM

## 2018-05-14 DIAGNOSIS — Z87891 Personal history of nicotine dependence: Secondary | ICD-10-CM

## 2018-05-14 DIAGNOSIS — Z888 Allergy status to other drugs, medicaments and biological substances status: Secondary | ICD-10-CM

## 2018-05-14 DIAGNOSIS — R413 Other amnesia: Secondary | ICD-10-CM | POA: Diagnosis present

## 2018-05-14 DIAGNOSIS — I129 Hypertensive chronic kidney disease with stage 1 through stage 4 chronic kidney disease, or unspecified chronic kidney disease: Secondary | ICD-10-CM | POA: Diagnosis not present

## 2018-05-14 DIAGNOSIS — K219 Gastro-esophageal reflux disease without esophagitis: Secondary | ICD-10-CM | POA: Diagnosis present

## 2018-05-14 DIAGNOSIS — E785 Hyperlipidemia, unspecified: Secondary | ICD-10-CM | POA: Diagnosis not present

## 2018-05-14 DIAGNOSIS — R05 Cough: Secondary | ICD-10-CM | POA: Diagnosis not present

## 2018-05-14 DIAGNOSIS — Z833 Family history of diabetes mellitus: Secondary | ICD-10-CM | POA: Diagnosis not present

## 2018-05-14 DIAGNOSIS — I493 Ventricular premature depolarization: Secondary | ICD-10-CM | POA: Diagnosis present

## 2018-05-14 DIAGNOSIS — R7303 Prediabetes: Secondary | ICD-10-CM | POA: Diagnosis present

## 2018-05-14 DIAGNOSIS — Z8249 Family history of ischemic heart disease and other diseases of the circulatory system: Secondary | ICD-10-CM | POA: Diagnosis not present

## 2018-05-14 DIAGNOSIS — F1021 Alcohol dependence, in remission: Secondary | ICD-10-CM | POA: Diagnosis present

## 2018-05-14 DIAGNOSIS — Z981 Arthrodesis status: Secondary | ICD-10-CM

## 2018-05-14 DIAGNOSIS — F319 Bipolar disorder, unspecified: Secondary | ICD-10-CM | POA: Diagnosis present

## 2018-05-14 DIAGNOSIS — M810 Age-related osteoporosis without current pathological fracture: Secondary | ICD-10-CM | POA: Diagnosis present

## 2018-05-14 DIAGNOSIS — K5909 Other constipation: Secondary | ICD-10-CM | POA: Diagnosis present

## 2018-05-14 DIAGNOSIS — I420 Dilated cardiomyopathy: Secondary | ICD-10-CM | POA: Diagnosis not present

## 2018-05-14 DIAGNOSIS — I214 Non-ST elevation (NSTEMI) myocardial infarction: Secondary | ICD-10-CM | POA: Diagnosis not present

## 2018-05-14 DIAGNOSIS — E7800 Pure hypercholesterolemia, unspecified: Secondary | ICD-10-CM | POA: Diagnosis not present

## 2018-05-14 DIAGNOSIS — N182 Chronic kidney disease, stage 2 (mild): Secondary | ICD-10-CM | POA: Diagnosis not present

## 2018-05-14 DIAGNOSIS — Z8349 Family history of other endocrine, nutritional and metabolic diseases: Secondary | ICD-10-CM | POA: Diagnosis not present

## 2018-05-14 DIAGNOSIS — I10 Essential (primary) hypertension: Secondary | ICD-10-CM | POA: Diagnosis not present

## 2018-05-14 DIAGNOSIS — M797 Fibromyalgia: Secondary | ICD-10-CM | POA: Diagnosis not present

## 2018-05-14 DIAGNOSIS — Z8673 Personal history of transient ischemic attack (TIA), and cerebral infarction without residual deficits: Secondary | ICD-10-CM

## 2018-05-14 DIAGNOSIS — Z7951 Long term (current) use of inhaled steroids: Secondary | ICD-10-CM

## 2018-05-14 DIAGNOSIS — Z800 Family history of malignant neoplasm of digestive organs: Secondary | ICD-10-CM

## 2018-05-14 DIAGNOSIS — I2583 Coronary atherosclerosis due to lipid rich plaque: Secondary | ICD-10-CM | POA: Diagnosis not present

## 2018-05-14 DIAGNOSIS — I2510 Atherosclerotic heart disease of native coronary artery without angina pectoris: Secondary | ICD-10-CM | POA: Diagnosis present

## 2018-05-14 DIAGNOSIS — Z885 Allergy status to narcotic agent status: Secondary | ICD-10-CM

## 2018-05-14 DIAGNOSIS — F419 Anxiety disorder, unspecified: Secondary | ICD-10-CM | POA: Diagnosis present

## 2018-05-14 DIAGNOSIS — I255 Ischemic cardiomyopathy: Secondary | ICD-10-CM | POA: Diagnosis present

## 2018-05-14 DIAGNOSIS — Z881 Allergy status to other antibiotic agents status: Secondary | ICD-10-CM

## 2018-05-14 DIAGNOSIS — Z811 Family history of alcohol abuse and dependence: Secondary | ICD-10-CM

## 2018-05-14 DIAGNOSIS — Z79899 Other long term (current) drug therapy: Secondary | ICD-10-CM

## 2018-05-14 DIAGNOSIS — Z820 Family history of epilepsy and other diseases of the nervous system: Secondary | ICD-10-CM

## 2018-05-14 DIAGNOSIS — Z8261 Family history of arthritis: Secondary | ICD-10-CM

## 2018-05-14 DIAGNOSIS — R079 Chest pain, unspecified: Secondary | ICD-10-CM | POA: Diagnosis not present

## 2018-05-14 LAB — CBC WITH DIFFERENTIAL/PLATELET
ABS IMMATURE GRANULOCYTES: 0.02 10*3/uL (ref 0.00–0.07)
Basophils Absolute: 0.1 10*3/uL (ref 0.0–0.1)
Basophils Relative: 1 %
Eosinophils Absolute: 0.2 10*3/uL (ref 0.0–0.5)
Eosinophils Relative: 3 %
HCT: 33.7 % — ABNORMAL LOW (ref 36.0–46.0)
HEMOGLOBIN: 11.5 g/dL — AB (ref 12.0–15.0)
Immature Granulocytes: 0 %
Lymphocytes Relative: 46 %
Lymphs Abs: 3.1 10*3/uL (ref 0.7–4.0)
MCH: 31.3 pg (ref 26.0–34.0)
MCHC: 34.1 g/dL (ref 30.0–36.0)
MCV: 91.6 fL (ref 80.0–100.0)
MONO ABS: 0.7 10*3/uL (ref 0.1–1.0)
Monocytes Relative: 10 %
NEUTROS ABS: 2.7 10*3/uL (ref 1.7–7.7)
Neutrophils Relative %: 40 %
Platelets: 240 10*3/uL (ref 150–400)
RBC: 3.68 MIL/uL — ABNORMAL LOW (ref 3.87–5.11)
RDW: 12.2 % (ref 11.5–15.5)
WBC: 6.8 10*3/uL (ref 4.0–10.5)
nRBC: 0 % (ref 0.0–0.2)

## 2018-05-14 LAB — HEMOGLOBIN A1C
Hgb A1c MFr Bld: 5.7 % — ABNORMAL HIGH (ref 4.8–5.6)
Mean Plasma Glucose: 116.89 mg/dL

## 2018-05-14 LAB — BASIC METABOLIC PANEL
Anion gap: 7 (ref 5–15)
BUN: 11 mg/dL (ref 8–23)
CO2: 27 mmol/L (ref 22–32)
Calcium: 9.6 mg/dL (ref 8.9–10.3)
Chloride: 103 mmol/L (ref 98–111)
Creatinine, Ser: 0.82 mg/dL (ref 0.44–1.00)
GFR calc Af Amer: >60 mL/min (ref >60)
GFR calc non Af Amer: >60 mL/min (ref >60)
GLUCOSE: 106 mg/dL — AB (ref 70–99)
Potassium: 4 mmol/L (ref 3.5–5.1)
Sodium: 137 mmol/L (ref 135–145)

## 2018-05-14 LAB — MAGNESIUM: Magnesium: 1.9 mg/dL (ref 1.7–2.4)

## 2018-05-14 LAB — COMPREHENSIVE METABOLIC PANEL
ALT: 41 U/L (ref 0–44)
AST: 25 U/L (ref 15–41)
Albumin: 4.3 g/dL (ref 3.5–5.0)
Alkaline Phosphatase: 43 U/L (ref 38–126)
Anion gap: 10 (ref 5–15)
BUN: 11 mg/dL (ref 8–23)
CHLORIDE: 104 mmol/L (ref 98–111)
CO2: 26 mmol/L (ref 22–32)
Calcium: 9.7 mg/dL (ref 8.9–10.3)
Creatinine, Ser: 1.08 mg/dL — ABNORMAL HIGH (ref 0.44–1.00)
GFR calc Af Amer: >60 mL/min (ref >60)
GFR calc non Af Amer: 54 mL/min — ABNORMAL LOW (ref >60)
Glucose, Bld: 96 mg/dL (ref 70–99)
Potassium: 3.6 mmol/L (ref 3.5–5.1)
SODIUM: 140 mmol/L (ref 135–145)
Total Bilirubin: 0.9 mg/dL (ref 0.3–1.2)
Total Protein: 6.5 g/dL (ref 6.5–8.1)

## 2018-05-14 LAB — I-STAT TROPONIN, ED: Troponin i, poc: 0.1 ng/mL (ref 0.00–0.08)

## 2018-05-14 LAB — CBC
HCT: 35.2 % — ABNORMAL LOW (ref 36.0–46.0)
HEMOGLOBIN: 11.6 g/dL — AB (ref 12.0–15.0)
MCH: 31.4 pg (ref 26.0–34.0)
MCHC: 33 g/dL (ref 30.0–36.0)
MCV: 95.4 fL (ref 80.0–100.0)
Platelets: 249 10*3/uL (ref 150–400)
RBC: 3.69 MIL/uL — ABNORMAL LOW (ref 3.87–5.11)
RDW: 12.3 % (ref 11.5–15.5)
WBC: 6 10*3/uL (ref 4.0–10.5)
nRBC: 0 % (ref 0.0–0.2)

## 2018-05-14 LAB — PROTIME-INR
INR: 1.05
INR: 1.11
Prothrombin Time: 13.6 seconds (ref 11.4–15.2)
Prothrombin Time: 14.2 seconds (ref 11.4–15.2)

## 2018-05-14 LAB — HEPARIN LEVEL (UNFRACTIONATED): HEPARIN UNFRACTIONATED: 0.45 [IU]/mL (ref 0.30–0.70)

## 2018-05-14 LAB — APTT
aPTT: 33 seconds (ref 24–36)
aPTT: 111 seconds — ABNORMAL HIGH (ref 24–36)

## 2018-05-14 LAB — TROPONIN I: Troponin I: 0.27 ng/mL (ref <0.03)

## 2018-05-14 MED ORDER — SODIUM CHLORIDE 0.9% FLUSH
3.0000 mL | Freq: Once | INTRAVENOUS | Status: AC
  Administered 2018-05-14: 3 mL via INTRAVENOUS

## 2018-05-14 MED ORDER — ONDANSETRON HCL 4 MG/2ML IJ SOLN: 4.0000 mg | Freq: Four times a day (QID) | INTRAMUSCULAR | Status: DC | PRN

## 2018-05-14 MED ORDER — ASPIRIN EC 81 MG PO TBEC
81.0000 mg | DELAYED_RELEASE_TABLET | Freq: Every day | ORAL | Status: DC
  Administered 2018-05-16: 81 mg via ORAL
  Filled 2018-05-14: qty 1

## 2018-05-14 MED ORDER — SODIUM CHLORIDE 0.9 % WEIGHT BASED INFUSION
3.0000 mL/kg/h | INTRAVENOUS | Status: DC
  Administered 2018-05-15: 3 mL/kg/h via INTRAVENOUS

## 2018-05-14 MED ORDER — CITALOPRAM HYDROBROMIDE 20 MG PO TABS
40.0000 mg | ORAL_TABLET | Freq: Every day | ORAL | Status: DC
  Administered 2018-05-15 – 2018-05-16 (×2): 40 mg via ORAL
  Filled 2018-05-14 (×2): qty 2

## 2018-05-14 MED ORDER — ASPIRIN 81 MG PO CHEW
81.0000 mg | CHEWABLE_TABLET | ORAL | Status: AC
  Administered 2018-05-15: 81 mg via ORAL
  Filled 2018-05-14: qty 1

## 2018-05-14 MED ORDER — ATORVASTATIN CALCIUM 80 MG PO TABS
80.0000 mg | ORAL_TABLET | Freq: Every day | ORAL | Status: DC
  Administered 2018-05-14 – 2018-05-15 (×2): 80 mg via ORAL
  Filled 2018-05-14 (×2): qty 1

## 2018-05-14 MED ORDER — ASPIRIN 300 MG RE SUPP: 300.0000 mg | RECTAL | Status: DC

## 2018-05-14 MED ORDER — ASPIRIN EC 81 MG PO TBEC: 81.0000 mg | DELAYED_RELEASE_TABLET | Freq: Every day | ORAL | Status: DC

## 2018-05-14 MED ORDER — NITROGLYCERIN 0.4 MG SL SUBL: 0.4000 mg | SUBLINGUAL_TABLET | SUBLINGUAL | Status: DC | PRN

## 2018-05-14 MED ORDER — ASPIRIN 81 MG PO CHEW
324.0000 mg | CHEWABLE_TABLET | ORAL | Status: DC
  Filled 2018-05-14: qty 4

## 2018-05-14 MED ORDER — POLYETHYLENE GLYCOL 3350 17 GM/SCOOP PO POWD
17.0000 g | Freq: Every day | ORAL | Status: DC
  Administered 2018-05-14: 17 g via ORAL
  Filled 2018-05-14: qty 255

## 2018-05-14 MED ORDER — LAMOTRIGINE 100 MG PO TABS
300.0000 mg | ORAL_TABLET | Freq: Every day | ORAL | Status: DC
  Administered 2018-05-14 – 2018-05-15 (×2): 300 mg via ORAL
  Filled 2018-05-14 (×2): qty 3

## 2018-05-14 MED ORDER — CARVEDILOL 12.5 MG PO TABS
12.5000 mg | ORAL_TABLET | Freq: Two times a day (BID) | ORAL | Status: DC
  Administered 2018-05-14 – 2018-05-16 (×4): 12.5 mg via ORAL
  Filled 2018-05-14 (×4): qty 1

## 2018-05-14 MED ORDER — SODIUM CHLORIDE 0.9% FLUSH
3.0000 mL | Freq: Two times a day (BID) | INTRAVENOUS | Status: DC
  Administered 2018-05-16: 3 mL via INTRAVENOUS

## 2018-05-14 MED ORDER — ACETAMINOPHEN 325 MG PO TABS
650.0000 mg | ORAL_TABLET | ORAL | Status: DC | PRN
  Administered 2018-05-15 – 2018-05-16 (×2): 650 mg via ORAL
  Filled 2018-05-14 (×2): qty 2

## 2018-05-14 MED ORDER — HEPARIN BOLUS VIA INFUSION
4000.0000 [IU] | Freq: Once | INTRAVENOUS | Status: AC
  Administered 2018-05-14: 4000 [IU] via INTRAVENOUS
  Filled 2018-05-14: qty 4000

## 2018-05-14 MED ORDER — CARVEDILOL 12.5 MG PO TABS: 12.5000 mg | ORAL_TABLET | Freq: Two times a day (BID) | ORAL | Status: DC

## 2018-05-14 MED ORDER — MONTELUKAST SODIUM 10 MG PO TABS
10.0000 mg | ORAL_TABLET | Freq: Every evening | ORAL | Status: DC
  Administered 2018-05-14 – 2018-05-15 (×2): 10 mg via ORAL
  Filled 2018-05-14 (×2): qty 1

## 2018-05-14 MED ORDER — SODIUM CHLORIDE 0.9% FLUSH: 3.0000 mL | INTRAVENOUS | Status: DC | PRN

## 2018-05-14 MED ORDER — ASPIRIN 325 MG PO TABS
325.0000 mg | ORAL_TABLET | Freq: Once | ORAL | Status: AC
  Administered 2018-05-14: 325 mg via ORAL
  Filled 2018-05-14: qty 1

## 2018-05-14 MED ORDER — SODIUM CHLORIDE 0.9 % WEIGHT BASED INFUSION
1.0000 mL/kg/h | INTRAVENOUS | Status: DC
  Administered 2018-05-15: 250 mL via INTRAVENOUS

## 2018-05-14 MED ORDER — POLYETHYLENE GLYCOL 3350 17 G PO PACK
17.0000 g | PACK | Freq: Every day | ORAL | Status: DC
  Administered 2018-05-14 – 2018-05-16 (×3): 17 g via ORAL
  Filled 2018-05-14 (×3): qty 1

## 2018-05-14 MED ORDER — HEPARIN (PORCINE) 25000 UT/250ML-% IV SOLN
900.0000 [IU]/h | INTRAVENOUS | Status: DC
  Administered 2018-05-14: 900 [IU]/h via INTRAVENOUS
  Filled 2018-05-14: qty 250

## 2018-05-14 MED ORDER — ALPRAZOLAM 0.5 MG PO TABS
1.0000 mg | ORAL_TABLET | Freq: Three times a day (TID) | ORAL | Status: DC
  Administered 2018-05-14 – 2018-05-16 (×5): 1 mg via ORAL
  Filled 2018-05-14 (×5): qty 2

## 2018-05-14 MED ORDER — ZOLPIDEM TARTRATE 5 MG PO TABS
5.0000 mg | ORAL_TABLET | Freq: Every day | ORAL | Status: DC
  Administered 2018-05-14 – 2018-05-15 (×2): 5 mg via ORAL
  Filled 2018-05-14 (×2): qty 1

## 2018-05-14 MED ORDER — FAMOTIDINE 20 MG PO TABS
40.0000 mg | ORAL_TABLET | Freq: Every day | ORAL | Status: DC
  Administered 2018-05-14 – 2018-05-16 (×3): 40 mg via ORAL
  Filled 2018-05-14 (×3): qty 2

## 2018-05-14 MED ORDER — SODIUM CHLORIDE 0.9 % IV SOLN: 250.0000 mL | INTRAVENOUS | Status: DC | PRN

## 2018-05-14 NOTE — Telephone Encounter (Signed)
Spoke with patient regarding symptoms. Patient reports chest pain/pressure last night, continues to have CP with SOB, neck and arm pain currently. Patient advised to call EMS for ER transport, patient declines EMS transport however does agree to ER evaluation. Plans to go to [facility] ER.

## 2018-05-14 NOTE — ED Triage Notes (Signed)
Pt presents with c/o chest pain and hypertension. Pt reports she felt pain in her chest yesterday, not as bad today. Pt reports she feels that her breathing is labored for the past 3 weeks with a cough, able to talk in complete sentences, no distress. Pt took her BP medicine today, BP 130/86.

## 2018-05-14 NOTE — Progress Notes (Signed)
[place] for Heparin Indication: chest pain/ACS  Allergies  Allergen Reactions  . Ceftin [Cefuroxime Axetil] Anaphylaxis    Swelling of eyes and tongue  . Ciprofloxacin Anaphylaxis    Difficulty breathing and generalized swelling  . Fosamax [Alendronate Sodium] Diarrhea  . Morphine And Related Other (See Comments)    Hallucinations/ disoriented - pt stated, "Only Morphine" - 03/23/17  . Prednisone Other (See Comments)    Hyperactivity  . Seroquel [Quetiapine] Other (See Comments)    oversedation   Patient Measurements: Height: 5\' 7"  (170.2 cm) Weight: 164 lb 10.9 oz (74.7 kg) IBW/kg (Calculated) : 61.6 Heparin Dosing Weight: 74.8 kg  Vital Signs: Temp: 98.1 F (36.7 C) (02/10 2042) Temp Source: Oral (02/10 2042) BP: 128/77 (02/10 2042) Pulse Rate: 85 (02/10 2042)  Labs: Recent Labs    05/14/18 1143 05/14/18 1234 05/14/18 2016 05/14/18 2039  HGB 11.6*  --   --  11.5*  HCT 35.2*  --   --  33.7*  PLT 249  --   --  240  APTT  --  33  --  111*  LABPROT  --  13.6  --  14.2  INR  --  1.05  --  1.11  HEPARINUNFRC  --   --  0.45  --   CREATININE 0.82  --   --   --    Estimated Creatinine Clearance: 72.1 mL/min (by C-G formula based on SCr of 0.82 mg/dL).  Medical History: Past Medical History:  Diagnosis Date  . Alcoholism in remission [facility])    states last alcohol 11 yrs ago  . Anxiety   . Asymptomatic gallstones   . Bipolar 1 disorder ([place])    Admission for mania x 2 (most recent 11/2017)  . Cervical spondylosis without myelopathy 12/19/2013   MRI 02/2014: multilevel DDD/spondylosis, not much change compared to prior MRI.  Pt not in favor of invasive therapy for her neck as of 04/2014.  Marland Kitchen COPD (chronic obstructive pulmonary disease) (HCC)    Moderate: anoro started 04/2017 by pulm, pt symptomatically improved and PFTs stable at f/u 06/2017--pulm f/u planned 1 yr.  . Dilutional hyponatremia   . Endometritis 05/2017   possible;  empiric tx with Flagyl by GYN.  . Environmental allergies   . Fibromyalgia   . GERD (gastroesophageal reflux disease)   . Herpes zoster 08/07/2017   L scapular region  . Hiatal hernia   . History of adenomatous polyp of colon 04/2008; 08/2014   No high grade dysplasia: recall 5 yrs ([name], [name])  . History of basal cell carcinoma excision    NOSE  . History of Helicobacter pylori infection 04/2008   +gastric biopsy (gastritis but no metaplasia, dysplasia, or malignancy identified)  . History of TIA (transient ischemic attack)    secondary to HELLP syndrome 1994 post c/s--  residual memory loss  . Hyperlipidemia    no meds in > a decade per pt.  Recommended statin 06/21/16.  Marland Kitchen Hypertension   . Internal hemorrhoids   . 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 04/2017.  . Memory loss    Abnl MRI brain and CT brain c/w chronic microvascular ischemia.  Repeat MRI brain 02/2014 stable (Dr. Mayra Reel with no plan to f/u with neuro as of 04/2014)  . Osteoporosis 05/2010   2012 'penia; 06/2015 '  porosis:  prolia.  repeat DEXA 2 yrs.  As of 12/2016 pt going to discuss w/GYN whether or not to continue prolia.  DEXA 09/2017 T-score -2.2 femur neck.  . Pelvic floor dysfunction    [facility] ([name])  . Postmenopausal vaginal bleeding 05/2017   x 1 small episode; GYN attempted endo bx but unable to penetrate cervix due to severe cerv stenosis (due to postmenopausal state + hx of LEEP).  Endo u/s showed thin endo lining.  Per GYN---no evidence of endomet pathology on w/u---obs as of 07/2017.  Marland Kitchen Prediabetes 06/2016   Fasting gluc 105; HbA1c at that time was 6.0%.  A1c 6.2% 12/2017.  Marland Kitchen Recurrent kidney stones 08/2013   Left ureteral calculus: Perc nephr--cystoscopy w/ureteroscopy + laser for stone removal.   Residual asymptomatic left renal nephrolithiasis <19mm present post-procedure.  Right sided hydronephrosis-persistent (on u/s)--urol ordered CT to further eval 08/2016: 1.6 cm nonobstructing stone lower pole left kidney, no hydro, plan for PCN extraction ([facility])  . Shortness of breath   . Sprain of neck 12/19/2013  . TMJ (temporomandibular joint disorder)    USES  MOU[employer] GUARD  . Toe fracture, left 12/2017   2nd toe prox phalanx (immobilization, [name])  . Vitamin D deficiency 05/2011  . Weak urinary stream 07/2016   with elevated PVR and mild left hydronephrosis secondary to this ([facility] started flomax 0.4mg  qd for this).   Medications:  Scheduled:  . ALPRAZolam  1 mg Oral TID  . aspirin  324 mg Oral NOW   Or  . aspirin  300 mg Rectal NOW  . [START ON 05/15/2018] aspirin  81 mg Oral Pre-Cath  . [START ON 05/15/2018] aspirin EC  81 mg Oral Daily  . atorvastatin  80 mg Oral q1800  . carvedilol  12.5 mg Oral BID WC  . [START ON 05/15/2018] citalopram  40 mg Oral Daily  . famotidine  40 mg Oral Daily  . lamoTRIgine  300 mg Oral QHS  . montelukast  10 mg Oral QPM  . polyethylene glycol  17 g Oral Daily  . sodium chloride flush  3 mL Intravenous Q12H  . zolpidem  5 mg Oral QHS   Infusions:  . sodium chloride    . [START ON 05/15/2018] sodium chloride     Followed by  . [START ON 05/15/2018] sodium chloride    . heparin 900 Units/hr (05/14/18 1258)    Assessment: 30 yoF to ED 2/10 with chest pain, HTN; noted chest pain yesterday, somewhat improved today. Heparin/Rx  Heparin level at goal this evening.  No bleeding or complications noted.  Goal of Therapy:  Heparin level 0.3-0.7 units/ml Monitor platelets by anticoagulation protocol: Yes   Plan:  Continue IV Heparin at current rate. Daily heparin level and CBC. F/u p cath tomorrow.  Marguerite Olea, [facility] Clinical Pharmacist Phone [phone]  05/14/2018 9:55 PM
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
## 2018-05-14 NOTE — Progress Notes (Signed)
[place] for Heparin Indication: chest pain/ACS  Allergies  Allergen Reactions  . Ceftin [Cefuroxime Axetil] Anaphylaxis    Swelling of eyes and tongue  . Ciprofloxacin Anaphylaxis    Difficulty breathing and generalized swelling  . Fosamax [Alendronate Sodium] Diarrhea  . Morphine And Related Other (See Comments)    Hallucinations/ disoriented - pt stated, "Only Morphine" - 03/23/17  . Prednisone Other (See Comments)    Hyperactivity  . Seroquel [Quetiapine] Other (See Comments)    oversedation   Patient Measurements: Height: 5\' 7"  (170.2 cm) Weight: 165 lb (74.8 kg) IBW/kg (Calculated) : 61.6 Heparin Dosing Weight: 74.8 kg  Vital Signs: Temp: 97.7 F (36.5 C) (02/10 1116) Temp Source: Oral (02/10 1116) BP: 130/86 (02/10 1116) Pulse Rate: 85 (02/10 1116)  Labs: Recent Labs    05/14/18 1143  HGB 11.6*  HCT 35.2*  PLT 249  CREATININE 0.82   Estimated Creatinine Clearance: 72.2 mL/min (by C-G formula based on SCr of 0.82 mg/dL).  Medical History: Past Medical History:  Diagnosis Date  . Alcoholism in remission [facility])    states last alcohol 11 yrs ago  . Anxiety   . Asymptomatic gallstones   . Bipolar 1 disorder ([place])    Admission for mania x 2 (most recent 11/2017)  . Cervical spondylosis without myelopathy 12/19/2013   MRI 02/2014: multilevel DDD/spondylosis, not much change compared to prior MRI.  Pt not in favor of invasive therapy for her neck as of 04/2014.  Marland Kitchen COPD (chronic obstructive pulmonary disease) (HCC)    Moderate: anoro started 04/2017 by pulm, pt symptomatically improved and PFTs stable at f/u 06/2017--pulm f/u planned 1 yr.  . Dilutional hyponatremia   . Endometritis 05/2017   possible; empiric tx with Flagyl by GYN.  . Environmental allergies   . Fibromyalgia   . GERD (gastroesophageal reflux disease)   . Herpes zoster 08/07/2017   L scapular region  . Hiatal hernia   . History of adenomatous polyp of  colon 04/2008; 08/2014   No high grade dysplasia: recall 5 yrs ([name], [name])  . History of basal cell carcinoma excision    NOSE  . History of Helicobacter pylori infection 04/2008   +gastric biopsy (gastritis but no metaplasia, dysplasia, or malignancy identified)  . History of TIA (transient ischemic attack)    secondary to HELLP syndrome 1994 post c/s--  residual memory loss  . Hyperlipidemia    no meds in > a decade per pt.  Recommended statin 06/21/16.  Marland Kitchen Hypertension   . Internal hemorrhoids   . 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 04/2017.  . Memory loss    Abnl MRI brain and CT brain c/w chronic microvascular ischemia.  Repeat MRI brain 02/2014 stable (Dr. Mayra Reel with no plan to f/u with neuro as of 04/2014)  . Osteoporosis 05/2010   2012 'penia; 06/2015 'porosis:  prolia.  repeat DEXA 2 yrs.  As of 12/2016 pt going to discuss w/GYN whether or not to continue prolia.  DEXA 09/2017 T-score -2.2 femur neck.  . Pelvic floor dysfunction    [facility] ([name])  . Postmenopausal vaginal bleeding 05/2017   x 1 small episode; GYN attempted endo bx but unable to penetrate cervix due to severe cerv stenosis (due to postmenopausal state + hx of LEEP).  Endo u/s showed  thin endo lining.  Per GYN---no evidence of endomet pathology on w/u---obs as of 07/2017.  Marland Kitchen Prediabetes 06/2016   Fasting gluc 105; HbA1c at that time was 6.0%.  A1c 6.2% 12/2017.  Marland Kitchen Recurrent kidney stones 08/2013   Left ureteral calculus: Perc nephr--cystoscopy w/ureteroscopy + laser for stone removal.  Residual asymptomatic left renal nephrolithiasis <72mm present post-procedure.  Right sided hydronephrosis-persistent (on u/s)--urol ordered CT to further eval 08/2016: 1.6 cm nonobstructing stone lower pole left kidney, no hydro,  plan for PCN extraction ([facility])  . Shortness of breath   . Sprain of neck 12/19/2013  . TMJ (temporomandibular joint disorder)    USES  MOUTH GUARD  . Toe fracture, left 12/2017   2nd toe prox phalanx (immobilization, [name])  . Vitamin D deficiency 05/2011  . Weak urinary stream 07/2016   with elevated PVR and mild left hydronephrosis secondary to this ([facility] started flomax 0.4mg  qd for this).   Medications:  Scheduled:   Infusions:  . heparin 900 Units/hr (05/14/18 1258)    Assessment: 69 yoF to ED 2/10 with chest pain, HTN; noted chest pain yesterday, somewhat improved today. Heparin/Rx  2/10: begin Heparin with 4000 unit load, infusion at 900 units/hr  Goal of Therapy:  Heparin level 0.3-0.7 units/ml Monitor platelets by anticoagulation protocol: Yes   Plan:   Heparin 4000 unit load, infusion at 900 units/hr begun at 1300 in ED  Daily CBC while on Heparin  Daily Heparin level when at steady state  Check first Hep level at [place], [place] PharmD Pager [phone] 05/14/2018, 1:35 PM

## 2018-05-14 NOTE — ED Provider Notes (Signed)
[name] DEPT Provider Note   CSN: [id number] Arrival date & time: 05/14/18  1059     History   Chief Complaint Chief Complaint  Patient presents with  . Chest Pain    HPI Erin Lopez is a 68 y.o. female hx of bipolar, COPD, fibromyalgia, hypertension here presenting with shortness of breath, chest pressure.  Patient states that she had some cough and shortness of breath for the last several days.  She went and took out her trash yesterday and states that she was out of breath.  She also has some chest pressure that radiates to the left jaw as well.  She called her doctor and was sent here for further evaluation.  States that she has no known coronary artery disease and denies any recent travel or leg swelling.  The history is provided by the patient.    Past Medical History:  Diagnosis Date  . Alcoholism in remission [facility])    states last alcohol 11 yrs ago  . Anxiety   . Asymptomatic gallstones   . Bipolar 1 disorder ([place])    Admission for mania x 2 (most recent 11/2017)  . Cervical spondylosis without myelopathy 12/19/2013   MRI 02/2014: multilevel DDD/spondylosis, not much change compared to prior MRI.  Pt not in favor of invasive therapy for her neck as of 04/2014.  Marland Kitchen COPD (chronic obstructive pulmonary disease) (HCC)    Moderate: anoro started 04/2017 by pulm, pt symptomatically improved and PFTs stable at f/u 06/2017--pulm f/u planned 1 yr.  . Dilutional hyponatremia   . Endometritis 05/2017   possible; empiric tx with Flagyl by GYN.  . Environmental allergies   . Fibromyalgia   . GERD (gastroesophageal reflux disease)   . Herpes zoster 08/07/2017   L scapular region  . Hiatal hernia   . History of adenomatous polyp of colon 04/2008; 08/2014   No high grade dysplasia: recall 5 yrs ([name], [name])  . History of basal cell carcinoma excision    NOSE  . History of Helicobacter pylori infection 04/2008   +gastric biopsy  (gastritis but no metaplasia, dysplasia, or malignancy identified)  . History of TIA (transient ischemic attack)    secondary to HELLP syndrome 1994 post c/s--  residual memory loss  . Hyperlipidemia    no meds in > a decade per pt.  Recommended statin 06/21/16.  Marland Kitchen Hypertension   . Internal hemorrhoids   . 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 04/2017.  . Memory loss    Abnl MRI brain and CT brain c/w chronic microvascular ischemia.  Repeat MRI brain 02/2014 stable (Dr. Mayra Reel with no plan to f/u with neuro as of 04/2014)  . Osteoporosis 05/2010   2012 'penia; 06/2015 'porosis:  prolia.  repeat DEXA 2 yrs.  As of 12/2016 pt going to discuss w/GYN whether or not to continue prolia.  DEXA 09/2017 T-score -2.2 femur neck.  . Pelvic floor dysfunction    [facility] ([name])  . Postmenopausal vaginal bleeding 05/2017   x 1 small episode; GYN attempted endo bx but unable to penetrate cervix due to severe cerv stenosis (due to postmenopausal state + hx of LEEP).  Endo u/s showed thin endo lining.  Per GYN---no evidence of endomet pathology on w/u---obs as of 07/2017.  Marland Kitchen Prediabetes 06/2016   Fasting gluc 105;  HbA1c at that time was 6.0%.  A1c 6.2% 12/2017.  Marland Kitchen Recurrent kidney stones 08/2013   Left ureteral calculus: Perc nephr--cystoscopy w/ureteroscopy + laser for stone removal.  Residual asymptomatic left renal nephrolithiasis <32mm present post-procedure.  Right sided hydronephrosis-persistent (on u/s)--urol ordered CT to further eval 08/2016: 1.6 cm nonobstructing stone lower pole left kidney, no hydro, plan for PCN extraction ([facility])  . Shortness of breath   . Sprain of neck 12/19/2013  . TMJ (temporomandibular joint disorder)    USES  MOUTH GUARD  . Toe fracture, left 12/2017   2nd toe prox phalanx  (immobilization, [name])  . Vitamin D deficiency 05/2011  . Weak urinary stream 07/2016   with elevated PVR and mild left hydronephrosis secondary to this ([facility] started flomax 0.4mg  qd for this).    Patient Active Problem List   Diagnosis Date Noted  . Acute encephalopathy 11/19/2017  . Hyponatremia 11/16/2017  . Bipolar 1 disorder ([place]) 12/29/2016  . Hypertension 12/29/2016  . TIA (transient ischemic attack) 12/29/2016  . Chronic pelvic pain in female 08/22/2016  . Stage 2 chronic kidney disease 08/22/2016  . Osteoporosis 09/30/2015  . Health maintenance examination 06/11/2015  . Suprapubic discomfort 06/05/2015  . Urethral caruncle 06/05/2015  . Infection of urinary tract 06/05/2015  . TMJ (temporomandibular joint disorder) 02/25/2015  . Right ankle injury 11/05/2014  . Cervical spondylosis without myelopathy 12/19/2013  . Sprain of neck 12/19/2013  . Memory difficulties 12/19/2013  . Postnasal drip 10/28/2013  . Renal calculus 08/05/2013  . Urinary frequency 08/01/2013  . Constipation, chronic 07/29/2013  . COPD (chronic obstructive pulmonary disease) ([place]) 06/30/2013  . Abnormal chest x-ray 06/30/2013  . Benign essential HTN 05/29/2013  . Kidney stone on left side 05/29/2013  . Hyperlipidemia 05/29/2013    Past Surgical History:  Procedure Laterality Date  . ANTERIOR CERVICAL DECOMP/DISCECTOMY FUSION  06-30-2009   C3 -- C5  AND EXPLORATION OF FUSION C5-7 W/  PLATE REMOVAL  . BUNIONECTOMY Left   . CERVICAL FUSION  2002   anterior C5 -- C7  . CERVICAL SPINE SURGERY  08-27-1999     C6 -- T1  LAMINECTOMY/  DISKECTOMY  . CESAREAN SECTION  1994  . COLONOSCOPY W/ POLYPECTOMY  04/2008;08/2014   2016 tubular adenoma x 1; +diverticulosis and int/ext hemorrhoids.  Recall 5 yrs  . CYSTOSCOPY WITH URETEROSCOPY AND STENT PLACEMENT Left 08/28/2013   Procedure: CYSTOSCOPY, RGP,  WITH URETEROSCOPY AND STENT REMOVAL;  Surgeon: [name], MD;  Location: [facility];  Service: Urology;  Laterality: Left;  . DEXA  09/2017   T-score -2.2 femur neck: improved compared to 06/2015.  Marland Kitchen ESOPHAGOGASTRODUODENOSCOPY  2010; 08/2014   +Candidal esophagitis; Mild chronic gastritis w/intestinal metaplasia--NEG H pylori, neg for eosinophilic esoph  . HOLMIUM LASER APPLICATION Left 6/43/3295   Procedure: HOLMIUM LASER APPLICATION;  Surgeon: [name], MD;  Location: [facility];  Service: Urology;  Laterality: Left;  . NEGATIVE SLEEP STUDY  04-01-2007  . NEPHROLITHOTOMY Left 08/05/2013   Procedure: NEPHROLITHOTOMY PERCUTANEOUS;  Surgeon: [name], MD;  Location: [facility];  Service: Urology;  Laterality: Left;  . TONSILLECTOMY AND ADENOIDECTOMY  1975  . TOTAL KNEE ARTHROPLASTY Right 2003  . ULTRASOUND EXAM,PELVIC COMPLETE ([facility] HX)  06/25/15   NORMAL (done by GYN)     OB History    Gravida  3   Para  1   Term      Preterm  AB      Living  1     SAB      TAB      Ectopic      Multiple      Live Births               Home Medications    Prior to Admission medications   Medication Sig Start Date End Date Taking? Authorizing Provider  acetaminophen (TYLENOL) 325 MG tablet Take 650 mg by mouth every 6 (six) hours as needed for mild pain or headache.   Yes [provider]  albuterol (PROAIR HFA) 108 (90 Base) MCG/ACT inhaler INHALE 2 PUFFS INTO THE LUNGS EVERY 4 (FOUR) HOURS AS NEEDED FOR WHEEZING OR SHORTNESS OF BREATH. Patient taking differently: Inhale 2 puffs into the lungs every 4 (four) hours as needed for wheezing or shortness of breath. INHALE 2 PUFFS INTO THE LUNGS EVERY 4 (FOUR) HOURS AS NEEDED FOR WHEEZING OR SHORTNESS OF BREATH. 12/26/17  Yes Parrett, Tammy S, NP  ALPRAZolam (XANAX) 1 MG tablet Take 1 tablet (1 mg total) by mouth 3 (three) times daily. 03/26/18  Yes McGowen, Adrian Blackwater, MD  ANORO ELLIPTA 62.5-25 MCG/INH AEPB TAKE 1 PUFF BY MOUTH EVERY DAY Patient taking differently: Inhale 1  puff into the lungs daily.  02/05/18  Yes Mannam, Praveen, MD  atorvastatin (LIPITOR) 20 MG tablet TAKE 1 TABLET (20 MG TOTAL) BY MOUTH DAILY. 01/19/18  Yes McGowen, Adrian Blackwater, MD  carvedilol (COREG) 25 MG tablet 1/2 tab po bid Patient taking differently: Take 12.5 mg by mouth 2 (two) times daily with a meal. 1/2 tab po bid 12/20/17  Yes McGowen, Adrian Blackwater, MD  citalopram (CELEXA) 40 MG tablet TAKE 1 TABLET BY MOUTH EVERY DAY Patient taking differently: Take 40 mg by mouth daily.  05/14/18  Yes McGowen, Adrian Blackwater, MD  cyclobenzaprine (FLEXERIL) 10 MG tablet 1 tab po bid Patient taking differently: Take 10 mg by mouth 2 (two) times daily. 1 tab po bid 05/08/18  Yes McGowen, Adrian Blackwater, MD  doxycycline (VIBRAMYCIN) 50 MG capsule Take 50 mg by mouth 2 (two) times daily.  01/09/18  Yes [provider]  famotidine (PEPCID) 40 MG tablet Take 1 tablet (40 mg total) by mouth daily. 12/06/17  Yes McGowen, Adrian Blackwater, MD  fluticasone (FLONASE) 50 MCG/ACT nasal spray SPRAY 2 SPRAYS INTO EACH NOSTRIL EVERY DAY Patient taking differently: Place 2 sprays into both nostrils daily.  02/19/18  Yes McGowen, Adrian Blackwater, MD  lamoTRIgine (LAMICTAL) 100 MG tablet Take 3 tablets (300 mg total) by mouth at bedtime. 11/29/17  Yes McGowen, Adrian Blackwater, MD  lisinopril (PRINIVIL,ZESTRIL) 10 MG tablet TAKE 1 TABLET BY MOUTH EVERY DAY Patient taking differently: Take 10 mg by mouth daily.  03/06/18  Yes McGowen, Adrian Blackwater, MD  montelukast (SINGULAIR) 10 MG tablet TAKE 1 TABLET (10 MG TOTAL) BY MOUTH EVERY EVENING. 05/02/18  Yes McGowen, Adrian Blackwater, MD  Multiple Vitamin (MULTI-VITAMINS) TABS Take 1 tablet by mouth daily.    Yes [provider]  polyethylene glycol powder (GLYCOLAX/MIRALAX) powder TAKE 17 G BY MOUTH DAILY. 06/19/17  Yes McGowen, Adrian Blackwater, MD  potassium citrate (UROCIT-K) 10 MEQ (1080 MG) SR tablet Take 20 mEq by mouth 2 (two) times daily. 10/24/17  Yes [provider]  Propylene Glycol (SYSTANE COMPLETE) 0.6 %  SOLN 1 drop in each eye bid Patient taking differently: Place 1 drop into both eyes 2 (two) times daily. 1 drop in each eye bid  12/20/17  Yes McGowen, Adrian Blackwater, MD  tamsulosin (FLOMAX) 0.4 MG CAPS capsule Take 0.4 mg by mouth daily.   Yes [provider]  zolpidem (AMBIEN) 10 MG tablet Take 1 tablet (10 mg total) by mouth at bedtime. 02/14/18  Yes McGowen, Adrian Blackwater, MD    Family History Family History  Problem Relation Age of Onset  . Alcohol abuse Mother   . Arthritis Mother   . Hypertension Mother   . Hyperlipidemia Mother   . Diabetes Mother   . Parkinsonism Mother   . Breast cancer Mother   . Prostate cancer Father   . Hypertension Father   . Hyperlipidemia Father   . Diabetes Father   . Heart disease Father   . Esophageal cancer Sister   . Colon polyps Sister   . Colon polyps Brother   . Heart disease Brother        x 2  . Irritable bowel syndrome Sister     Social History Social History   Tobacco Use  . Smoking status: Former Smoker    Packs/day: 1.00    Years: 33.00    Pack years: 33.00    Types: Cigarettes    Last attempt to quit: 08/22/1993    Years since quitting: 24.7  . Smokeless tobacco: Never Used  Substance Use Topics  . Alcohol use: No    Alcohol/week: 0.0 standard drinks    Comment: ALCOHOLIC IN REMISSION SINCE  2004  . Drug use: No     Allergies   Ceftin [cefuroxime axetil]; Ciprofloxacin; Fosamax [alendronate sodium]; Morphine and related; Prednisone; and Seroquel [quetiapine]   Review of Systems Review of Systems  Cardiovascular: Positive for chest pain.  All other systems reviewed and are negative.    Physical Exam Updated Vital Signs BP 134/86 (BP Location: Right Arm)   Pulse 82   Temp 97.7 F (36.5 C) (Oral)   Resp 16   Ht 5\' 7"  (1.702 m)   Wt 74.8 kg   SpO2 94%   BMI 25.84 kg/m   Physical Exam Vitals signs and nursing note reviewed.  Constitutional:      Appearance: She is well-developed.     Comments: Sightly  uncomfortable   HENT:     Head: Normocephalic.  Eyes:     Extraocular Movements: Extraocular movements intact.     Pupils: Pupils are equal, round, and reactive to light.  Neck:     Musculoskeletal: Normal range of motion and neck supple.  Cardiovascular:     Rate and Rhythm: Normal rate and regular rhythm.     Heart sounds: Normal heart sounds.  Pulmonary:     Effort: Pulmonary effort is normal.     Breath sounds: Normal breath sounds.  Chest:     Comments: No reproducible tenderness  Abdominal:     General: Bowel sounds are normal.     Palpations: Abdomen is soft.  Musculoskeletal: Normal range of motion.  Skin:    General: Skin is warm.     Capillary Refill: Capillary refill takes less than 2 seconds.  Neurological:     General: No focal deficit present.     Mental Status: She is alert and oriented to person, place, and time.  Psychiatric:        Mood and Affect: Mood normal.        Behavior: Behavior normal.      ED Treatments / Results  Labs (all labs ordered are listed, but only abnormal results are displayed) Labs Reviewed  BASIC METABOLIC PANEL - Abnormal; Notable for the following components:      Result Value   Glucose, Bld 106 (*)    All other components within normal limits  CBC - Abnormal; Notable for the following components:   RBC 3.69 (*)    Hemoglobin 11.6 (*)    HCT 35.2 (*)    All other components within normal limits  I-STAT TROPONIN, ED - Abnormal; Notable for the following components:   Troponin i, poc 0.10 (*)    All other components within normal limits  APTT  PROTIME-INR  HEPARIN LEVEL (UNFRACTIONATED)    EKG EKG Interpretation  Date/Time:  Monday May 14 2018 11:11:49 EST Ventricular Rate:  87 PR Interval:    QRS Duration: 84 QT Interval:  371 QTC Calculation: 447 R Axis:   66 Text Interpretation:  Sinus rhythm Ventricular premature complex Nonspecific T abnormalities, lateral leads Baseline wander in lead(s) III TWI new  since previous  Confirmed by Wandra Arthurs [phone]) on 05/14/2018 12:15:49 PM Also confirmed by Wandra Arthurs [phone]), editor Biggers, Jeannetta Nap [phone])  on 05/14/2018 12:24:19 PM    Radiology Dg Chest 2 View  Result Date: 05/14/2018 CLINICAL DATA:  Cough and chest pain EXAM: CHEST - 2 VIEW COMPARISON:  November 19, 2017 FINDINGS: There is no edema or consolidation. The heart size and pulmonary vascularity are normal. No adenopathy. No bone lesions. There is postoperative change in the lower cervical spine as well as in the right cervical-thoracic junction region. IMPRESSION: No edema or consolidation.  Stable cardiac silhouette. Electronically Signed   By: Lowella Grip III M.D.   On: 05/14/2018 12:35    Procedures Procedures (including critical care time)  CRITICAL CARE Performed by: Wandra Arthurs   Total critical care time: 30 minutes  Critical care time was exclusive of separately billable procedures and treating other patients.  Critical care was necessary to treat or prevent imminent or life-threatening deterioration.  Critical care was time spent personally by me on the following activities: development of treatment plan with patient and/or surrogate as well as nursing, discussions with consultants, evaluation of patient's response to treatment, examination of patient, obtaining history from patient or surrogate, ordering and performing treatments and interventions, ordering and review of laboratory studies, ordering and review of radiographic studies, pulse oximetry and re-evaluation of patient's condition.   Medications Ordered in ED Medications  heparin ADULT infusion 100 units/mL (25000 units/221mL sodium chloride 0.45%) (900 Units/hr Intravenous New Bag/Given 05/14/18 1258)  sodium chloride flush (NS) 0.9 % injection 3 mL (3 mLs Intravenous Given 05/14/18 1246)  aspirin tablet 325 mg (325 mg Oral Given 05/14/18 1257)  heparin bolus via infusion 4,000 Units (4,000 Units Intravenous  Bolus from Bag 05/14/18 1258)     Initial Impression / Assessment and Plan / ED Course  I have reviewed the triage vital signs and the nursing notes.  Pertinent labs & imaging results that were available during my care of the patient were reviewed by me and considered in my medical decision making (see chart for details).    ARES TEGTMEYER is a 68 y.o. female here with chest pain, SOB with exertion. Concerned for possible ACS. Patient has TWI that is new since previous, no obvious STEMI. Will get labs, trop, CXR.   12:10 pm Trop positive at 0.1. Repeat EKG showed TWI and no STEMI. Started on heparin, given ASA. Consulted cardiology for possible NSTEMI.   3:03 PM Dr. Radford Pax saw patient and will admit and  cath tomorrow.    Final Clinical Impressions(s) / ED Diagnoses   Final diagnoses:  None    ED Discharge Orders    None       Drenda Freeze, MD 05/14/18 1504
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
## 2018-05-15 ENCOUNTER — Encounter (HOSPITAL_COMMUNITY): Payer: Self-pay | Admitting: Cardiology

## 2018-05-15 ENCOUNTER — Inpatient Hospital Stay (HOSPITAL_COMMUNITY): Payer: Medicare Other

## 2018-05-15 ENCOUNTER — Encounter (HOSPITAL_COMMUNITY)
Admission: EM | Disposition: A | Discharge status: Home / Self Care | Payer: Self-pay | Source: Home / Self Care | Attending: Cardiology

## 2018-05-15 DIAGNOSIS — R079 Chest pain, unspecified: Secondary | ICD-10-CM

## 2018-05-15 HISTORY — PX: LEFT HEART CATH AND CORONARY ANGIOGRAPHY: CATH118249

## 2018-05-15 LAB — BASIC METABOLIC PANEL
Anion gap: 11 (ref 5–15)
BUN: 13 mg/dL (ref 8–23)
CO2: 26 mmol/L (ref 22–32)
Calcium: 8.9 mg/dL (ref 8.9–10.3)
Chloride: 106 mmol/L (ref 98–111)
Creatinine, Ser: 1.03 mg/dL — ABNORMAL HIGH (ref 0.44–1.00)
GFR calc Af Amer: >60 mL/min (ref >60)
GFR calc non Af Amer: 57 mL/min — ABNORMAL LOW (ref >60)
Glucose, Bld: 106 mg/dL — ABNORMAL HIGH (ref 70–99)
POTASSIUM: 3.8 mmol/L (ref 3.5–5.1)
Sodium: 143 mmol/L (ref 135–145)

## 2018-05-15 LAB — CBC
HCT: 31.6 % — ABNORMAL LOW (ref 36.0–46.0)
HEMOGLOBIN: 10.6 g/dL — AB (ref 12.0–15.0)
MCH: 31 pg (ref 26.0–34.0)
MCHC: 33.5 g/dL (ref 30.0–36.0)
MCV: 92.4 fL (ref 80.0–100.0)
Platelets: 225 10*3/uL (ref 150–400)
RBC: 3.42 MIL/uL — ABNORMAL LOW (ref 3.87–5.11)
RDW: 12.4 % (ref 11.5–15.5)
WBC: 6.1 10*3/uL (ref 4.0–10.5)
nRBC: 0 % (ref 0.0–0.2)

## 2018-05-15 LAB — LIPID PANEL
Cholesterol: 158 mg/dL (ref 0–200)
HDL: 62 mg/dL (ref >40)
LDL Cholesterol: 79 mg/dL (ref 0–99)
Total CHOL/HDL Ratio: 2.5 RATIO
Triglycerides: 85 mg/dL (ref <150)
VLDL: 17 mg/dL (ref 0–40)

## 2018-05-15 LAB — ECHOCARDIOGRAM COMPLETE
Height: 67 in
Weight: 2634.94 oz

## 2018-05-15 LAB — TROPONIN I
Troponin I: 0.14 ng/mL (ref <0.03)
Troponin I: 0.1 ng/mL (ref <0.03)

## 2018-05-15 LAB — HEPARIN LEVEL (UNFRACTIONATED): Heparin Unfractionated: 0.44 IU/mL (ref 0.30–0.70)

## 2018-05-15 SURGERY — LEFT HEART CATH AND CORONARY ANGIOGRAPHY
Anesthesia: LOCAL

## 2018-05-15 MED ORDER — ISOSORBIDE MONONITRATE ER 30 MG PO TB24
30.0000 mg | ORAL_TABLET | Freq: Every day | ORAL | Status: DC
  Administered 2018-05-16: 30 mg via ORAL
  Filled 2018-05-15: qty 1

## 2018-05-15 MED ORDER — SODIUM CHLORIDE 0.9 % IV SOLN: 250.0000 mL | INTRAVENOUS | Status: DC | PRN

## 2018-05-15 MED ORDER — SODIUM CHLORIDE 0.9 % IV SOLN
INTRAVENOUS | Status: AC
  Administered 2018-05-15: 13:00:00 via INTRAVENOUS

## 2018-05-15 MED ORDER — IOHEXOL 350 MG/ML SOLN
INTRAVENOUS | Status: DC | PRN
  Administered 2018-05-15: 80 mL via INTRA_ARTERIAL

## 2018-05-15 MED ORDER — MIDAZOLAM HCL 2 MG/2ML IJ SOLN
INTRAMUSCULAR | Status: AC
  Filled 2018-05-15: qty 2

## 2018-05-15 MED ORDER — VERAPAMIL HCL 2.5 MG/ML IV SOLN
INTRAVENOUS | Status: DC | PRN
  Administered 2018-05-15: 10 mL via INTRA_ARTERIAL

## 2018-05-15 MED ORDER — SODIUM CHLORIDE 0.9% FLUSH: 3.0000 mL | INTRAVENOUS | Status: DC | PRN

## 2018-05-15 MED ORDER — NITROGLYCERIN 1 MG/10 ML FOR IR/CATH LAB
INTRA_ARTERIAL | Status: DC | PRN
  Administered 2018-05-15: 200 ug via INTRACORONARY

## 2018-05-15 MED ORDER — HEPARIN SODIUM (PORCINE) 1000 UNIT/ML IJ SOLN
INTRAMUSCULAR | Status: DC | PRN
  Administered 2018-05-15: 4000 [IU] via INTRAVENOUS

## 2018-05-15 MED ORDER — HEPARIN SODIUM (PORCINE) 1000 UNIT/ML IJ SOLN
INTRAMUSCULAR | Status: AC
  Filled 2018-05-15: qty 1

## 2018-05-15 MED ORDER — VERAPAMIL HCL 2.5 MG/ML IV SOLN
INTRAVENOUS | Status: AC
  Filled 2018-05-15: qty 2

## 2018-05-15 MED ORDER — NITROGLYCERIN 1 MG/10 ML FOR IR/CATH LAB
INTRA_ARTERIAL | Status: AC
  Filled 2018-05-15: qty 10

## 2018-05-15 MED ORDER — ADULT MULTIVITAMIN W/MINERALS CH
1.0000 | ORAL_TABLET | Freq: Every day | ORAL | Status: DC
  Administered 2018-05-15 – 2018-05-16 (×2): 1 via ORAL
  Filled 2018-05-15 (×2): qty 1

## 2018-05-15 MED ORDER — HEPARIN (PORCINE) IN NACL 1000-0.9 UT/500ML-% IV SOLN
INTRAVENOUS | Status: AC
  Filled 2018-05-15: qty 1000

## 2018-05-15 MED ORDER — CYCLOBENZAPRINE HCL 10 MG PO TABS
10.0000 mg | ORAL_TABLET | Freq: Two times a day (BID) | ORAL | Status: DC | PRN
  Administered 2018-05-15 (×2): 10 mg via ORAL
  Filled 2018-05-15 (×2): qty 1

## 2018-05-15 MED ORDER — SODIUM CHLORIDE 0.9% FLUSH
3.0000 mL | Freq: Two times a day (BID) | INTRAVENOUS | Status: DC
  Administered 2018-05-15 – 2018-05-16 (×2): 3 mL via INTRAVENOUS

## 2018-05-15 MED ORDER — MIDAZOLAM HCL 2 MG/2ML IJ SOLN
INTRAMUSCULAR | Status: DC | PRN
  Administered 2018-05-15: 2 mg via INTRAVENOUS

## 2018-05-15 MED ORDER — FENTANYL CITRATE (PF) 100 MCG/2ML IJ SOLN
INTRAMUSCULAR | Status: AC
  Filled 2018-05-15: qty 2

## 2018-05-15 MED ORDER — LIDOCAINE HCL (PF) 1 % IJ SOLN
INTRAMUSCULAR | Status: AC
  Filled 2018-05-15: qty 30

## 2018-05-15 MED ORDER — FENTANYL CITRATE (PF) 100 MCG/2ML IJ SOLN
INTRAMUSCULAR | Status: DC | PRN
  Administered 2018-05-15: 25 ug via INTRAVENOUS

## 2018-05-15 MED ORDER — LIDOCAINE HCL (PF) 1 % IJ SOLN
INTRAMUSCULAR | Status: DC | PRN
  Administered 2018-05-15: 2 mL

## 2018-05-15 MED ORDER — HEPARIN (PORCINE) IN NACL 1000-0.9 UT/500ML-% IV SOLN
INTRAVENOUS | Status: DC | PRN
  Administered 2018-05-15: 500 mL

## 2018-05-15 SURGICAL SUPPLY — 10 items
CATH OPTITORQUE TIG 4.0 5F (CATHETERS) ×2 IMPLANT
DEVICE RAD COMP TR BAND LRG (VASCULAR PRODUCTS) ×2 IMPLANT
GLIDESHEATH SLEND A-KIT 6F 22G (SHEATH) ×2 IMPLANT
GUIDEWIRE INQWIRE 1.5J.035X260 (WIRE) ×1 IMPLANT
INQWIRE 1.5J .035X260CM (WIRE) ×2
KIT HEART LEFT (KITS) ×2 IMPLANT
PACK CARDIAC CATHETERIZATION (CUSTOM PROCEDURE TRAY) ×2 IMPLANT
SHEATH PROBE COVER 6X72 (BAG) ×2 IMPLANT
TRANSDUCER W/STOPCOCK (MISCELLANEOUS) ×2 IMPLANT
TUBING CIL FLEX 10 FLL-RA (TUBING) ×2 IMPLANT

## 2018-05-15 NOTE — Plan of Care (Signed)
  Problem: Clinical Measurements: Goal: Will remain free from infection Outcome: Progressing Note:  No s/s of infection noted. Goal: Respiratory complications will improve Outcome: Progressing Note:  No s/s of respiratory complications noted. Goal: Cardiovascular complication will be avoided Outcome: Progressing Note:  No s/s of cardiovascular complication noted.   

## 2018-05-15 NOTE — Progress Notes (Signed)
  Echocardiogram 2D Echocardiogram has been performed.  Erin Lopez L Androw 05/15/2018, 1:32 PM

## 2018-05-15 NOTE — Interval H&P Note (Signed)
History and Physical Interval Note:  05/15/2018 9:17 AM  [name]  has presented today for surgery, with the diagnosis of NON-STEMI. The various methods of treatment have been discussed with the patient and family. After consideration of risks, benefits and other options for treatment, the patient has consented to  Procedure(s): LEFT HEART CATH AND CORONARY ANGIOGRAPHY (N/A) with possible PERCUTANEOUS CORONARY INTERVENTION . as a surgical intervention .  The patient's history has been reviewed, patient examined, no change in status, stable for surgery.  I have reviewed the patient's chart and labs.  Questions were answered to the patient's satisfaction.     Cath Lab Visit (complete for each Cath Lab visit)  Clinical Evaluation Leading to the Procedure:   ACS: Yes.    Non-ACS:    Anti-ischemic medical therapy: Minimal Therapy (1 class of medications)  Non-Invasive Test Results: No non-invasive testing performed  Prior CABG: No previous CABG   Glenetta Hew

## 2018-05-16 DIAGNOSIS — I2583 Coronary atherosclerosis due to lipid rich plaque: Secondary | ICD-10-CM

## 2018-05-16 DIAGNOSIS — I2510 Atherosclerotic heart disease of native coronary artery without angina pectoris: Secondary | ICD-10-CM

## 2018-05-16 DIAGNOSIS — E785 Hyperlipidemia, unspecified: Secondary | ICD-10-CM

## 2018-05-16 LAB — CBC
HCT: 29.9 % — ABNORMAL LOW (ref 36.0–46.0)
Hemoglobin: 9.6 g/dL — ABNORMAL LOW (ref 12.0–15.0)
MCH: 30.4 pg (ref 26.0–34.0)
MCHC: 32.1 g/dL (ref 30.0–36.0)
MCV: 94.6 fL (ref 80.0–100.0)
Platelets: 206 10*3/uL (ref 150–400)
RBC: 3.16 MIL/uL — ABNORMAL LOW (ref 3.87–5.11)
RDW: 12.5 % (ref 11.5–15.5)
WBC: 4.4 10*3/uL (ref 4.0–10.5)
nRBC: 0 % (ref 0.0–0.2)

## 2018-05-16 LAB — GLUCOSE, CAPILLARY: Glucose-Capillary: 95 mg/dL (ref 70–99)

## 2018-05-16 MED ORDER — NITROGLYCERIN 0.4 MG SL SUBL: 0.4000 mg | SUBLINGUAL_TABLET | SUBLINGUAL | 3 refills | Status: DC | PRN

## 2018-05-16 MED ORDER — ATORVASTATIN CALCIUM 80 MG PO TABS: 80.0000 mg | ORAL_TABLET | Freq: Every day | ORAL | 3 refills | Status: DC

## 2018-05-16 MED ORDER — ISOSORBIDE MONONITRATE ER 30 MG PO TB24: 30.0000 mg | ORAL_TABLET | Freq: Every day | ORAL | 3 refills | Status: DC

## 2018-05-16 MED ORDER — ASPIRIN 81 MG PO TBEC: 81.0000 mg | DELAYED_RELEASE_TABLET | Freq: Every day | ORAL | 3 refills | Status: DC

## 2018-05-16 NOTE — Progress Notes (Signed)
Progress Note  Patient Name: Erin Lopez Date of Encounter: 05/16/2018  Primary Cardiologist: Fransico Him, MD   Subjective   Denies any chest pain or SOB.  Cath showed distal LAD spasm responsive to nitrates and moderate RCA dz.  Echo correlates with cath findings with apical anterior, inferior, anterolateral and inferolateral hypokinesis.,  Inpatient Medications    Scheduled Meds: . ALPRAZolam  1 mg Oral TID  . aspirin EC  81 mg Oral Daily  . atorvastatin  80 mg Oral q1800  . carvedilol  12.5 mg Oral BID WC  . citalopram  40 mg Oral Daily  . famotidine  40 mg Oral Daily  . isosorbide mononitrate  30 mg Oral Daily  . lamoTRIgine  300 mg Oral QHS  . montelukast  10 mg Oral QPM  . multivitamin with minerals  1 tablet Oral Daily  . polyethylene glycol  17 g Oral Daily  . sodium chloride flush  3 mL Intravenous Q12H  . sodium chloride flush  3 mL Intravenous Q12H  . zolpidem  5 mg Oral QHS   Continuous Infusions: . sodium chloride    . sodium chloride     PRN Meds: sodium chloride, sodium chloride, acetaminophen, cyclobenzaprine, nitroGLYCERIN, ondansetron (ZOFRAN) IV, sodium chloride flush, sodium chloride flush   Vital Signs    Vitals:   05/15/18 1555 05/15/18 1655 05/15/18 1742 05/15/18 2033  BP: 116/70 130/89 (!) 133/92 115/72  Pulse: 85 91  89  Resp: 19 15    Temp:    (!) 97.4 F (36.3 C)  TempSrc:    Oral  SpO2: 95% 98%  96%  Weight:      Height:        Intake/Output Summary (Last 24 hours) at 05/16/2018 0850 Last data filed at 05/15/2018 1800 Gross per 24 hour  Intake 1008.33 ml  Output 3 ml  Net 1005.33 ml   Filed Weights   05/14/18 1116 05/14/18 1852  Weight: 74.8 kg 74.7 kg    Telemetry    NSR - Personally Reviewed  ECG    No new EKG to review - Personally Reviewed  Physical Exam   GEN: No acute distress.   Neck: No JVD Cardiac: RRR, no murmurs, rubs, or gallops.  Respiratory: Clear to auscultation bilaterally. GI: Soft,  nontender, non-distended  MS: No edema; No deformity. Neuro:  Nonfocal  Psych: Normal affect   Labs    Chemistry Recent Labs  Lab 05/14/18 1143 05/14/18 2039 05/15/18 0805  NA 137 140 143  K 4.0 3.6 3.8  CL 103 104 106  CO2 27 26 26   GLUCOSE 106* 96 106*  BUN 11 11 13   CREATININE 0.82 1.08* 1.03*  CALCIUM 9.6 9.7 8.9  PROT  --  6.5  --   ALBUMIN  --  4.3  --   AST  --  25  --   ALT  --  41  --   ALKPHOS  --  43  --   BILITOT  --  0.9  --   GFRNONAA >60 54* 57*  GFRAA >60 >60 >60  ANIONGAP 7 10 11      Hematology Recent Labs  Lab 05/14/18 2039 05/15/18 0307 05/16/18 0344  WBC 6.8 6.1 4.4  RBC 3.68* 3.42* 3.16*  HGB 11.5* 10.6* 9.6*  HCT 33.7* 31.6* 29.9*  MCV 91.6 92.4 94.6  MCH 31.3 31.0 30.4  MCHC 34.1 33.5 32.1  RDW 12.2 12.4 12.5  PLT 240 225 206    Cardiac  Enzymes Recent Labs  Lab 05/14/18 2039 05/15/18 0307 05/15/18 0805  TROPONINI 0.27* 0.14* 0.10*    Recent Labs  Lab 05/14/18 1158  TROPIPOC 0.10*     BNPNo results for input(s): BNP, PROBNP in the last 168 hours.   DDimer No results for input(s): DDIMER in the last 168 hours.   Radiology    Dg Chest 2 View  Result Date: 05/14/2018 CLINICAL DATA:  Cough and chest pain EXAM: CHEST - 2 VIEW COMPARISON:  November 19, 2017 FINDINGS: There is no edema or consolidation. The heart size and pulmonary vascularity are normal. No adenopathy. No bone lesions. There is postoperative change in the lower cervical spine as well as in the right cervical-thoracic junction region. IMPRESSION: No edema or consolidation.  Stable cardiac silhouette. Electronically Signed   By: Lowella Grip III M.D.   On: 05/14/2018 12:35    Cardiac Studies   Cardiac Cath 05/15/2018 Conclusion     The left ventricular systolic function is low normal: EF estimated 50 and 55% with distal anterior-anterior apical hypokinesis.  Dist LAD lesion is 60 % stenosed. Following IC NTG reduced to~30% diffuse disease consistent  with spasm  Prox RCA to Mid RCA lesion is 45% stenosed.   SUMMARY  Distal anterior-anteroapical hypokinesis with NTG responsive distal LAD spasm  Moderate RCA disease  Otherwise minimal CAD  RECOMMENDATIONS  Start long-acting nitrate, consider amlodipine  Continue aggressive risk factor medication  Anticipate okay to discharge tomorrow   2D echo 05/15/2018 IMPRESSIONS   1. The left ventricle has mildly reduced systolic function of 29-56%. The cavity size was normal. Left ventricular diastolic Doppler parameters are consistent with pseudonormal.  2. The right ventricle has normal systolic function. The cavity was normal. There is no increase in right ventricular wall thickness.  3. Left atrial size was mildly dilated.  4. The mitral valve is normal in structure.  5. The tricuspid valve is normal in structure.  6. The aortic valve is tricuspid There is mild sclerosis of the aortic valve.  7. The pulmonic valve was normal in structure.  8. The inferior vena cava was normal in size with <50% respiratory variability.  9. There is severe hypokinesis of the apical anterolateral, inferolateral, anterior and inferior left ventricular segments.  Patient Profile     68 y.o. 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  Chest pain/NSTEMI -Her symptoms are consistent with ACS.  She had chest pain that started with taking out the trash yesterday evening associated with radiation to her jaw as well as shortness of breath and diaphoresis.  This lasted a few hours and then subsided on its own.  She has no discomfort at present. -She has multiple cardiac risk factors including hypertension, hyperlipidemia, remote history of tobacco use and a family history of CAD. -Cath showed Distal anterior-anteroapical  hypokinesis with NTG responsive distal LAD spasm, moderate RCA disease and otherwise minimal CAD -2D echo c/w cath findings with apical anterolateral, inferolateral, inferior and anterior hypokinesis with EF 45-50% . -continue ASA 81ng daily, atorva 80mg  daily, carvedilol 12.5mg  BID and Imdur 30mg  dailly  2.  Hypertension -BP controlled at 115/63mmHg this am but soft overnight -Continue carvedilol 12.5 mg twice daily   3.  Hyperlipidemia -LDL 79 this admit -atorvastatin increased to 80 mg  daily -needs FLP and ALT in 6 weeks  4.  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  She is stable for discharge home today with TOC followup in office in 7-10 days.  Will need FLP and ALT in 6 weeks.    For questions or updates, please contact [place] Please consult [email or web address] for contact info under Cardiology/STEMI.      Signed, Fransico Him, MD  05/16/2018, 8:50 AM

## 2018-05-16 NOTE — Care Management (Signed)
1138 05-16-18 Case Manager spoke with patient regarding medication cost concerns. Per patient last admission she was sent a bill for medications. CM explained that her status was probably Observation and she is accountable for 20% of all costs. Patient is inpatient status this admission and cost should be covered 100%. Pt appreciated the clarification. Patient is covered via [employer] via [place]. Medications range from $3.00-$3.80. No further needs from CM at this time. Bethena Roys, RN,BSN [phone]

## 2018-05-16 NOTE — Discharge Summary (Signed)
Discharge Summary    Patient ID: Erin Lopez MRN: [id number]; DOB: 1951/02/08  Admit date: 05/14/2018 Discharge date: 05/16/2018  Primary Care Provider: Tammi Sou, MD  Primary Cardiologist: Fransico Him, MD  Primary Electrophysiologist:  None   Discharge Diagnoses    Principal Problem:   NSTEMI (non-ST elevated myocardial infarction) [facility]) Active Problems:   Hyperlipidemia   COPD (chronic obstructive pulmonary disease) ([place])   Memory difficulties   Hypertension   Stage 2 chronic kidney disease   Allergies Allergies  Allergen Reactions  . Ceftin [Cefuroxime Axetil] Anaphylaxis    Swelling of eyes and tongue  . Ciprofloxacin Anaphylaxis    Difficulty breathing and generalized swelling  . Fosamax [Alendronate Sodium] Diarrhea  . Morphine And Related Other (See Comments)    Hallucinations/ disoriented - pt stated, "Only Morphine" - 03/23/17  . Prednisone Other (See Comments)    Hyperactivity  . Seroquel [Quetiapine] Other (See Comments)    oversedation    Diagnostic Studies/Procedures    Left heart cath 05/15/18:  The left ventricular systolic function is low normal: EF estimated 50 and 55% with distal anterior-anterior apical hypokinesis.  Dist LAD lesion is 60 % stenosed. Following IC NTG reduced to~30% diffuse disease consistent with spasm  Prox RCA to Mid RCA lesion is 45% stenosed.   SUMMARY  Distal anterior-anteroapical hypokinesis with NTG responsive distal LAD spasm  Moderate RCA disease  Otherwise minimal CAD  RECOMMENDATIONS  Start long-acting nitrate, consider amlodipine  Continue aggressive risk factor medication  Anticipate okay to discharge tomorrow   Echo 05/15/18: 1. The left ventricle has mildly reduced systolic function of 25-95%. The cavity size was normal. Left ventricular diastolic Doppler parameters are consistent with pseudonormal.  2. The right ventricle has normal systolic function. The cavity was normal. There is  no increase in right ventricular wall thickness.  3. Left atrial size was mildly dilated.  4. The mitral valve is normal in structure.  5. The tricuspid valve is normal in structure.  6. The aortic valve is tricuspid There is mild sclerosis of the aortic valve.  7. The pulmonic valve was normal in structure.  8. The inferior vena cava was normal in size with <50% respiratory variability.  9. There is severe hypokinesis of the apical anterolateral, inferolateral, anterior and inferior left ventricular segments.    History of Present Illness     This is a very pleasant 68yo female with a history of HTN, Hyperlipidemia, bipolar disorder, COPD, prediabetes, remote tobacco use and a family history of heart disease in both her brother and father.  She was in her usual state of health until few days ago when she started developing shortness of breath and cough.  She says the cough would occur some at night.  Yesterday she says that she went to take the trash out and all of a sudden became very short of breath and then had chest pressure radiating into her left jaw.  She said her chest pressure and shortness of breath lasted this few hours and then resolved.  She called her PCP and was told to come the emergency room for further evaluation.  Currently she denies any chest discomfort.  She denied any nausea with the chest pain but did have some diaphoresis.  Initial troponin elevated at 0.1.  Creatinine normal at 0.82 hemoglobin 11.6 and platelet count 249.  EKG showed normal sinus rhythm with T wave inversions in V1 and V2.  Hospital Course     Consultants:  Chest pain, NSTEMI Symptoms are consistent with ACS. Chest pain describes as exertional and associated with radiation to her jaw, shortness of breath, and diaphoresis. Home regimen included ASA, statin, coreg, and lisinopril. Proceeded with left heart catheterization, which showed distal anterior-anteroapical hypokinesis with distal LAD lesion 60%  stnosis. IC NTG reduced stenosis to 30% consistent with spasm. RCA with 45% stenosis in the proximal to mid segment. Recommended long-acting nitrate. She was discharged on ASA, lipitor, coreg 12.5 mg BID, imdur 30 mg, and nitro SL.    Hyperlipidemia 05/15/2018: Cholesterol 158; HDL 62; LDL Cholesterol 79; Triglycerides 85; VLDL 17 Lipitor increased to 80 mg daily. Repeat FLP and ALT in 6 weeks.    Hypertension Pressures controlled. Continue coreg as above.   Ischemic DCM EF 45-50%. Start ARB outpatient if BP stable.   Pt seen and examined by Dr. Radford Pax and deemed stable for discharge. Follow up has been arranged.   _____________  Discharge Vitals Blood pressure 124/74, pulse 91, temperature (!) 97.4 F (36.3 C), temperature source Oral, resp. rate 15, height 5\' 7"  (1.702 m), weight 74.7 kg, SpO2 96 %.  Filed Weights   05/14/18 1116 05/14/18 1852  Weight: 74.8 kg 74.7 kg    Labs & Radiologic Studies    CBC Recent Labs    05/14/18 2039 05/15/18 0307 05/16/18 0344  WBC 6.8 6.1 4.4  NEUTROABS 2.7  --   --   HGB 11.5* 10.6* 9.6*  HCT 33.7* 31.6* 29.9*  MCV 91.6 92.4 94.6  PLT 240 225 629   Basic Metabolic Panel Recent Labs    05/14/18 2039 05/15/18 0805  NA 140 143  K 3.6 3.8  CL 104 106  CO2 26 26  GLUCOSE 96 106*  BUN 11 13  CREATININE 1.08* 1.03*  CALCIUM 9.7 8.9  MG 1.9  --    Liver Function Tests Recent Labs    05/14/18 2039  AST 25  ALT 41  ALKPHOS 43  BILITOT 0.9  PROT 6.5  ALBUMIN 4.3   No results for input(s): LIPASE, AMYLASE in the last 72 hours. Cardiac Enzymes Recent Labs    05/14/18 2039 05/15/18 0307 05/15/18 0805  TROPONINI 0.27* 0.14* 0.10*   BNP Invalid input(s): POCBNP D-Dimer No results for input(s): DDIMER in the last 72 hours. Hemoglobin A1C Recent Labs    05/14/18 2039  HGBA1C 5.7*   Fasting Lipid Panel Recent Labs    05/15/18 0307  CHOL 158  HDL 62  LDLCALC 79  TRIG 85  CHOLHDL 2.5   Thyroid Function  Tests No results for input(s): TSH, T4TOTAL, T3FREE, THYROIDAB in the last 72 hours.  Invalid input(s): FREET3 _____________  Dg Chest 2 View  Result Date: 05/14/2018 CLINICAL DATA:  Cough and chest pain EXAM: CHEST - 2 VIEW COMPARISON:  November 19, 2017 FINDINGS: There is no edema or consolidation. The heart size and pulmonary vascularity are normal. No adenopathy. No bone lesions. There is postoperative change in the lower cervical spine as well as in the right cervical-thoracic junction region. IMPRESSION: No edema or consolidation.  Stable cardiac silhouette. Electronically Signed   By: Lowella Grip III M.D.   On: 05/14/2018 12:35   Disposition   Pt is being discharged home today in good condition.  Follow-up Plans & Appointments    Follow-up Information    Charlie Pitter, PA-C Follow up on 05/22/2018.   Specialties:  Cardiology, Radiology Why:  11:00 am for [facility] Contact information: [address] [place]  [place] [address] [phone]          Discharge Instructions    Amb Referral to Cardiac Rehabilitation   Complete by:  As directed    Diagnosis:  NSTEMI   Diet - low sodium heart healthy   Complete by:  As directed    Increase activity slowly   Complete by:  As directed       Discharge Medications   Allergies as of 05/16/2018      Reactions   Ceftin [cefuroxime Axetil] Anaphylaxis   Swelling of eyes and tongue   Ciprofloxacin Anaphylaxis   Difficulty breathing and generalized swelling   Fosamax [alendronate Sodium] Diarrhea   Morphine And Related Other (See Comments)   Hallucinations/ disoriented - pt stated, "Only Morphine" - 03/23/17   Prednisone Other (See Comments)   Hyperactivity   Seroquel [quetiapine] Other (See Comments)   oversedation      Medication List    STOP taking these medications   lisinopril 10 MG tablet Commonly known as:  PRINIVIL,ZESTRIL   potassium citrate 10 MEQ (1080 MG) SR tablet Commonly known as:   UROCIT-K     TAKE these medications   acetaminophen 325 MG tablet Commonly known as:  TYLENOL Take 650 mg by mouth every 6 (six) hours as needed for mild pain or headache.   albuterol 108 (90 Base) MCG/ACT inhaler Commonly known as:  PROAIR HFA INHALE 2 PUFFS INTO THE LUNGS EVERY 4 (FOUR) HOURS AS NEEDED FOR WHEEZING OR SHORTNESS OF BREATH. What changed:    how much to take  how to take this  when to take this  reasons to take this   ALPRAZolam 1 MG tablet Commonly known as:  XANAX Take 1 tablet (1 mg total) by mouth 3 (three) times daily.   ANORO ELLIPTA 62.5-25 MCG/INH Aepb Generic drug:  umeclidinium-vilanterol TAKE 1 PUFF BY MOUTH EVERY DAY What changed:  See the new instructions.   aspirin 81 MG EC tablet Take 1 tablet (81 mg total) by mouth daily. Start taking on:  May 17, 2018   atorvastatin 80 MG tablet Commonly known as:  LIPITOR Take 1 tablet (80 mg total) by mouth daily at 6 PM. What changed:    medication strength  how much to take  when to take this   carvedilol 25 MG tablet Commonly known as:  COREG 1/2 tab po bid What changed:    how much to take  how to take this  when to take this   citalopram 40 MG tablet Commonly known as:  CELEXA TAKE 1 TABLET BY MOUTH EVERY DAY   cyclobenzaprine 10 MG tablet Commonly known as:  FLEXERIL 1 tab po bid What changed:    how much to take  how to take this  when to take this   doxycycline 50 MG capsule Commonly known as:  VIBRAMYCIN Take 50 mg by mouth 2 (two) times daily.   famotidine 40 MG tablet Commonly known as:  PEPCID Take 1 tablet (40 mg total) by mouth daily.   fluticasone 50 MCG/ACT nasal spray Commonly known as:  FLONASE SPRAY 2 SPRAYS INTO EACH NOSTRIL EVERY DAY What changed:  See the new instructions.   isosorbide mononitrate 30 MG 24 hr tablet Commonly known as:  IMDUR Take 1 tablet (30 mg total) by mouth daily. Start taking on:  May 17, 2018   lamoTRIgine  100 MG tablet Commonly known as:  LAMICTAL Take 3 tablets (300 mg total) by mouth at bedtime.  montelukast 10 MG tablet Commonly known as:  SINGULAIR TAKE 1 TABLET (10 MG TOTAL) BY MOUTH EVERY EVENING.   MULTI-VITAMINS Tabs Take 1 tablet by mouth daily.   nitroGLYCERIN 0.4 MG SL tablet Commonly known as:  NITROSTAT Place 1 tablet (0.4 mg total) under the tongue every 5 (five) minutes x 3 doses as needed for chest pain.   polyethylene glycol powder powder Commonly known as:  GLYCOLAX/MIRALAX TAKE 17 G BY MOUTH DAILY.   Propylene Glycol 0.6 % Soln Commonly known as:  SYSTANE COMPLETE 1 drop in each eye bid What changed:    how much to take  how to take this  when to take this   tamsulosin 0.4 MG Caps capsule Commonly known as:  FLOMAX Take 0.4 mg by mouth daily.   zolpidem 10 MG tablet Commonly known as:  AMBIEN Take 1 tablet (10 mg total) by mouth at bedtime.        Acute coronary syndrome (MI, NSTEMI, STEMI, etc) this admission?: Yes.     AHA/ACC Clinical Performance & Quality Measures: 1. Aspirin prescribed? - Yes 2. ADP Receptor Inhibitor (Plavix/Clopidogrel, Brilinta/Ticagrelor or Effient/Prasugrel) prescribed (includes medically managed patients)? - No - spasm, no stent 3. Beta Blocker prescribed? - Yes 4. High Intensity Statin (Lipitor 40-80mg  or Crestor 20-40mg ) prescribed? - Yes 5. EF assessed during THIS hospitalization? - Yes 6. For EF <40%, was ACEI/ARB prescribed? - No - Reason:  marginal pressure 7. For EF <40%, Aldosterone Antagonist (Spironolactone or Eplerenone) prescribed? - No - Reason:  marginal pressure 8. Cardiac Rehab Phase II ordered (Included Medically managed Patients)? - Yes     Outstanding Labs/Studies   Fasting Lipids   Duration of Discharge Encounter   Greater than 30 minutes including physician time.  Signed, [place], PA 05/16/2018, 2:52 PM

## 2018-05-16 NOTE — Progress Notes (Signed)
CARDIAC REHAB PHASE I   PRE:  Rate/Rhythm: 90 SR  BP:  Supine:   Sitting:120/72   Standing:    SaO2: 97%RA  MODE:  Ambulation: 400 ft   POST:  Rate/Rhythm: 114 ST  BP:  Supine:   Sitting: 141/85, 124/74  Standing:    SaO2: 96%RA 0900-1000 Pt walked 400 ft with steady gait. A little SOB. MI education completed with pt who voiced understanding. Stressed MI restrictions, NTG use, heart healthy and low sodium food choices, ex ed and CRP 2. Referred to [facility] program. Pt has memory issues and stated she could not remember all ed. Turned down pages in MI book as we reviewed and ex and diet written. Encouraged pt to read at home.   Graylon Good, RN BSN  05/16/2018 9:51 AM

## 2018-05-17 ENCOUNTER — Telehealth (HOSPITAL_COMMUNITY): Payer: Self-pay

## 2018-05-17 NOTE — Telephone Encounter (Signed)
Pt insurance is active and benefits verified through [facility]. Co-pay $20.00, DED $0.00/$0.00 met, out of pocket $3,900.00/$40.00 met, co-insurance 0%. No pre-authorization required. Passport, 05/17/2018 @ 3:20PM, REF# [id number]  Will contact patient to see if she is interested in the Cardiac Rehab Program. If interested, patient will need to complete follow up appt. Once completed, patient will be contacted for scheduling upon review by the RN Navigator.

## 2018-05-20 ENCOUNTER — Encounter: Payer: Self-pay | Admitting: Family Medicine

## 2018-05-22 ENCOUNTER — Encounter: Payer: Self-pay | Admitting: Physician Assistant

## 2018-05-22 NOTE — Progress Notes (Signed)
Cardiology Office Note   Date:  05/23/2018   ID:  Erin Lopez, DOB 1950-08-10, MRN [id number]  PCP:  Tammi Sou, MD  Cardiologist: Dr. Fransico Him, MD   Chief Complaint  Patient presents with  . Follow-up    History of Present Illness: Erin Lopez is a 68 y.o. female who presents for post hospital follow-up for  Erin Lopez has a prior history of HTN, HLD, bipolar disorder, COPD, prediabetes, remote tobacco use and family history of heart disease in both her brother and father.  She presented to [facility] on 05/14/2018 with chest pressure and shortness of breath. She reported that she was in her usual state of health until few days prior to admission when she started developing shortness of breath and cough. She reported that the cough would occur mostly at night. On 02/09/2020she stated that she went to take the trash out and all of a sudden became very short of breath and then had chest pressure radiating into her left jaw. She said her chest pressure and shortness of breath lasted this few hours and then resolved. She called her PCP and was told to come the emergency room for further evaluation. On evaluation she denied any chest discomfort. She denied any nausea with the chest pain but did have some diaphoresis. Initial troponin elevated at 0.1. Creatinine was normal at 0.82, hemoglobin 11.6 and platelet count 249. EKG showed normal sinus rhythm with T wave inversions in V1 and V2.  Given the above, plan was made for cardiac catheterization performed on 05/15/2018 which revealed a low normal LV function at 50-55% with distal anterior-anterior apical hypokinesis. There was a distal LAD lesion at 60%. Following IC NTG, this reduced to approximately 30% diffuse disease , consistent with vasospasm. There was proximal to mid RCA lesion as well that was 45% stenosed. Recommendations were for long acting nitrates and consideration of amlodipine with aggressive risk factor  modification. Troponin trend-0.27>0.14>0.10  Cath site unremarkable. Plan is to continue ASA 81, atorvastatin 80, carvedilol 12.5. Will add Imdur 15mg  daily for now and titrate as tolerated in the OP setting   Today, she reports no recurrent anginal symptoms since discharge. She has multiple complaints of generalized arm and leg pain, all of which were present prior to cath. She follows with neurosurgery (has neck surgery several years ago) and follows with PCP and rheumatology for lower leg pain. She denies claudication symptoms and states that the pain appears more muscular with mild gait disturbance. She has been diagnosed with fibromyalgia in the past. Her pain is relieved with Tylenol, heat packs, and muscle relaxants. She has multiple questions as to how to reduce her risk of CAD. We discussed diet modifications and exercise incorporation. Overall from a cardiology perspective, she is doing quite well. Cath site unremarkable   Past Medical History:  Diagnosis Date  . Alcoholism in remission [facility])    states last alcohol 11 yrs ago  . Anxiety   . Asymptomatic gallstones   . Bipolar 1 disorder ([place])    Admission for mania x 2 (most recent 11/2017)  . Cervical spondylosis without myelopathy 12/19/2013   MRI 02/2014: multilevel DDD/spondylosis, not much change compared to prior MRI.  Pt not in favor of invasive therapy for her neck as of 04/2014.  Marland Kitchen COPD (chronic obstructive pulmonary disease) (HCC)    Moderate: anoro started 04/2017 by pulm, pt symptomatically improved and PFTs stable at f/u 06/2017--pulm f/u planned 1 yr.  . Dilutional  hyponatremia   . Endometritis 05/2017   possible; empiric tx with Flagyl by GYN.  . Environmental allergies   . Fibromyalgia   . GERD (gastroesophageal reflux disease)   . Herpes zoster 08/07/2017   L scapular region  . Hiatal hernia   . History of adenomatous polyp of colon 04/2008; 08/2014   No high grade dysplasia: recall 5 yrs ([name], [name])  .  History of basal cell carcinoma excision    NOSE  . History of Helicobacter pylori infection 04/2008   +gastric biopsy (gastritis but no metaplasia, dysplasia, or malignancy identified)  . History of TIA (transient ischemic attack)    secondary to HELLP syndrome 1994 post c/s--  residual memory loss  . Hyperlipidemia    no meds in > a decade per pt.  Recommended statin 06/21/16.  Marland Kitchen Hypertension   . Internal hemorrhoids   . Ischemic cardiomyopathy 05/2018   Echo EF 45-50% in context of NSTEMI from CA spasm.  . 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 04/2017.  . Memory loss    Abnl MRI brain and CT brain c/w chronic microvascular ischemia.  Repeat MRI brain 02/2014 stable (Dr. Mayra Reel with no plan to f/u with neuro as of 04/2014)  . NSTEMI (non-ST elevated myocardial infarction) ([place]) 05/2018   Cath showed distal LAD spasm; mod RCA dz.   Echo with some severe hypokinesis and EF 45-50%. Pt started on indur 30mg  qd 05/2018.  . Osteoporosis 05/2010   2012 'penia; 06/2015 'porosis:  prolia.  repeat DEXA 2 yrs.  As of 12/2016 pt going to discuss w/GYN whether or not to continue prolia.  DEXA 09/2017 T-score -2.2 femur neck.  . Pelvic floor dysfunction    [facility] ([name])  . Postmenopausal vaginal bleeding 05/2017   x 1 small episode; GYN attempted endo bx but unable to penetrate cervix due to severe cerv stenosis (due to postmenopausal state + hx of LEEP).  Endo u/s showed thin endo lining.  Per GYN---no evidence of endomet pathology on w/u---obs as of 07/2017.  Marland Kitchen Prediabetes 06/2016   Fasting gluc 105; HbA1c at that time was 6.0%.  A1c 6.2% 12/2017.  Marland Kitchen Recurrent kidney stones 08/2013   Left ureteral calculus: Perc nephr--cystoscopy w/ureteroscopy + laser for stone removal.  Residual asymptomatic left renal  nephrolithiasis <81mm present post-procedure.  Right sided hydronephrosis-persistent (on u/s)--urol ordered CT to further eval 08/2016: 1.6 cm nonobstructing stone lower pole left kidney, no hydro, plan for PCN extraction ([facility])  . Shortness of breath   . Sprain of neck 12/19/2013  . TMJ (temporomandibular joint disorder)    USES  MOUTH GUARD  . Toe fracture, left 12/2017   2nd toe prox phalanx (immobilization, [name])  . Vitamin D deficiency 05/2011  . Weak urinary stream 07/2016   with elevated PVR and mild left hydronephrosis secondary to this ([facility] started flomax 0.4mg  qd for this).    Past Surgical History:  Procedure Laterality Date  . ANTERIOR CERVICAL DECOMP/DISCECTOMY FUSION  06-30-2009   C3 -- C5  AND EXPLORATION OF FUSION C5-7 W/  PLATE REMOVAL  . BUNIONECTOMY Left   . CERVICAL FUSION  2002   anterior C5 -- C7  . CERVICAL SPINE SURGERY  08-27-1999     C6 -- T1  LAMINECTOMY/  DISKECTOMY  . CESAREAN SECTION  1994  .  COLONOSCOPY W/ POLYPECTOMY  04/2008;08/2014   2016 tubular adenoma x 1; +diverticulosis and int/ext hemorrhoids.  Recall 5 yrs  . CYSTOSCOPY WITH URETEROSCOPY AND STENT PLACEMENT Left 08/28/2013   Procedure: CYSTOSCOPY, RGP,  WITH URETEROSCOPY AND STENT REMOVAL;  Surgeon: [name], MD;  Location: [facility];  Service: Urology;  Laterality: Left;  . DEXA  09/2017   T-score -2.2 femur neck: improved compared to 06/2015.  Marland Kitchen ESOPHAGOGASTRODUODENOSCOPY  2010; 08/2014   +Candidal esophagitis; Mild chronic gastritis w/intestinal metaplasia--NEG H pylori, neg for eosinophilic esoph  . HOLMIUM LASER APPLICATION Left 1/96/2229   Procedure: HOLMIUM LASER APPLICATION;  Surgeon: [name], MD;  Location: [facility];  Service: Urology;  Laterality: Left;  . LEFT HEART CATH AND CORONARY ANGIOGRAPHY N/A 05/15/2018   Evidence for spasm in distal LAD.  Mod RCA atherosclerosis, o/w no CAD.  Procedure: LEFT HEART CATH AND CORONARY  ANGIOGRAPHY;  Surgeon: Leonie Man, MD;  Location: [facility];  Service: Cardiovascular;  Laterality: N/A;  . NEGATIVE SLEEP STUDY  04-01-2007  . NEPHROLITHOTOMY Left 08/05/2013   Procedure: NEPHROLITHOTOMY PERCUTANEOUS;  Surgeon: [name], MD;  Location: [facility];  Service: Urology;  Laterality: Left;  . TONSILLECTOMY AND ADENOIDECTOMY  1975  . TOTAL KNEE ARTHROPLASTY Right 2003  . TRANSTHORACIC ECHOCARDIOGRAM  05/2018   (after MI secondary to arterial spasm): EF 45-50%; severe hypokinesis of apical anterolateral, inferolateral , anterior, and inferior LV myocardium.  Marland Kitchen ULTRASOUND EXAM,PELVIC COMPLETE ([facility] HX)  06/25/15   NORMAL (done by GYN)     Current Outpatient Medications  Medication Sig Dispense Refill  . acetaminophen (TYLENOL) 325 MG tablet Take 650 mg by mouth every 6 (six) hours as needed for mild pain or headache.    . albuterol (PROVENTIL HFA;VENTOLIN HFA) 108 (90 Base) MCG/ACT inhaler Inhale 2 puffs into the lungs every 4 (four) hours as needed for wheezing or shortness of breath.    . ALPRAZolam (XANAX) 1 MG tablet Take 1 tablet (1 mg total) by mouth 3 (three) times daily. 270 tablet 1  . aspirin EC 81 MG EC tablet Take 1 tablet (81 mg total) by mouth daily. 90 tablet 3  . atorvastatin (LIPITOR) 80 MG tablet Take 1 tablet (80 mg total) by mouth daily at 6 PM. 90 tablet 3  . carvedilol (COREG) 25 MG tablet Take 12.5 mg by mouth 2 (two) times daily with a meal.    . citalopram (CELEXA) 40 MG tablet Take 40 mg by mouth daily.    . cyclobenzaprine (FLEXERIL) 10 MG tablet Take 10 mg by mouth 2 (two) times daily.    Marland Kitchen doxycycline (VIBRAMYCIN) 50 MG capsule Take 50 mg by mouth 2 (two) times daily.   3  . famotidine (PEPCID) 40 MG tablet Take 1 tablet (40 mg total) by mouth daily. 30 tablet 11  . fluticasone (FLONASE) 50 MCG/ACT nasal spray Place 2 sprays into both nostrils daily.    . isosorbide mononitrate (IMDUR) 30 MG 24 hr tablet Take 1 tablet (30 mg total) by  mouth daily. 90 tablet 3  . lamoTRIgine (LAMICTAL) 100 MG tablet Take 3 tablets (300 mg total) by mouth at bedtime. 90 tablet 11  . montelukast (SINGULAIR) 10 MG tablet TAKE 1 TABLET (10 MG TOTAL) BY MOUTH EVERY EVENING. 90 tablet 4  . Multiple Vitamin (MULTI-VITAMINS) TABS Take 1 tablet by mouth daily.     . nitroGLYCERIN (NITROSTAT) 0.4 MG SL tablet Place 1 tablet (0.4 mg  total) under the tongue every 5 (five) minutes x 3 doses as needed for chest pain. 25 tablet 3  . polyethylene glycol powder (GLYCOLAX/MIRALAX) powder TAKE 17 G BY MOUTH DAILY. 3162 g 0  . Propylene Glycol (SYSTANE COMPLETE) 0.6 % SOLN Apply 1 drop to eye 2 (two) times daily.    . tamsulosin (FLOMAX) 0.4 MG CAPS capsule Take 0.4 mg by mouth daily.    Marland Kitchen triamcinolone cream (KENALOG) 0.1 % Apply 1 application topically daily.    Marland Kitchen umeclidinium-vilanterol (ANORO ELLIPTA) 62.5-25 MCG/INH AEPB Inhale 1 puff into the lungs daily.    Marland Kitchen zolpidem (AMBIEN) 10 MG tablet Take 1 tablet (10 mg total) by mouth at bedtime. 90 tablet 1   No current facility-administered medications for this visit.     Allergies:   Ceftin [cefuroxime axetil]; Ciprofloxacin; Fosamax [alendronate sodium]; Morphine and related; Prednisone; and Seroquel [quetiapine]    Social History:  The patient  reports that she quit smoking about 24 years ago. Her smoking use included cigarettes. She has a 33.00 pack-year smoking history. She has never used smokeless tobacco. She reports that she does not drink alcohol or use drugs.   Family History:  The patient'sfamily history includes Alcohol abuse in her mother; Arthritis in her mother; Breast cancer in her mother; Colon polyps in her brother and sister; Diabetes in her father and mother; Esophageal cancer in her sister; Heart disease in her brother and father; Hyperlipidemia in her father and mother; Hypertension in her father and mother; Irritable bowel syndrome in her sister; Parkinsonism in her mother; Prostate cancer  in her father.    ROS:  Please see the history of present illness.  Otherwise, review of systems are positive for none.   All other systems are reviewed and negative.    PHYSICAL EXAM: VS:  BP 120/76   Pulse 82   Ht 5' 7.5" (1.715 m)   Wt 173 lb 1.9 oz (78.5 kg)   SpO2 96%   BMI 26.71 kg/m  , BMI Body mass index is 26.71 kg/m.  General: Well developed, well nourished, NAD Skin: Warm, dry, intact  Head: Normocephalic, atraumatic, clear, moist mucus membranes. Neck: Negative for carotid bruits. No JVD Lungs:Clear to ausculation bilaterally. No wheezes, rales, or rhonchi. Breathing is unlabored. Cardiovascular: RRR with S1 S2. No murmurs, rubs, gallops, or LV heave appreciated. MSK: Strength and tone appear normal for age. 5/5 in all extremities Extremities: No edema. No clubbing or cyanosis. DP/PT pulses 2+ bilaterally Neuro: Alert and oriented. No focal deficits. No facial asymmetry. MAE spontaneously. Psych: Responds to questions appropriately with normal affect.     EKG:  EKG is ordered today. The ekg ordered today demonstrates NSR with no significant change    Recent Labs: 11/16/2017: TSH 1.766 05/14/2018: ALT 41; Magnesium 1.9 05/15/2018: BUN 13; Creatinine, Ser 1.03; Potassium 3.8; Sodium 143 05/16/2018: Hemoglobin 9.6; Platelets 206    Lipid Panel    Component Value Date/Time   CHOL 158 05/15/2018 0307   TRIG 85 05/15/2018 0307   HDL 62 05/15/2018 0307   CHOLHDL 2.5 05/15/2018 0307   VLDL 17 05/15/2018 0307   LDLCALC 79 05/15/2018 0307   LDLDIRECT 144.1 05/29/2013 1156     Wt Readings from Last 3 Encounters:  05/23/18 173 lb 1.9 oz (78.5 kg)  05/14/18 164 lb 10.9 oz (74.7 kg)  03/30/18 168 lb 2 oz (76.3 kg)     Other studies Reviewed: Additional studies/ records that were reviewed today include:  Left heart cath  05/15/18:  The left ventricular systolic function is low normal: EF estimated 50 and 55% with distal anterior-anterior apical  hypokinesis.  Dist LAD lesion is 60 % stenosed. Following IC NTG reduced to~30% diffuse disease consistent with spasm  Prox RCA to Mid RCA lesion is 45% stenosed.  SUMMARY  Distal anterior-anteroapical hypokinesis with NTG responsive distal LAD spasm  Moderate RCA disease  Otherwise minimal CAD  RECOMMENDATIONS  Start long-acting nitrate, consider amlodipine  Continue aggressive risk factor medication  Anticipate okay to discharge tomorrow   Echo 05/15/18: 1. The left ventricle has mildly reduced systolic function of 41-32%. The cavity size was normal. Left ventricular diastolic Doppler parameters are consistent with pseudonormal. 2. The right ventricle has normal systolic function. The cavity was normal. There is no increase in right ventricular wall thickness. 3. Left atrial size was mildly dilated. 4. The mitral valve is normal in structure. 5. The tricuspid valve is normal in structure. 6. The aortic valve is tricuspid There is mild sclerosis of the aortic valve. 7. The pulmonic valve was normal in structure. 8. The inferior vena cava was normal in size with <50% respiratory variability. 9. There is severe hypokinesis of the apical anterolateral, inferolateral, anterior and inferior left ventricular segments.    ASSESSMENT AND PLAN:  1.  Chest pain consistent with vasospasm per cardiac catheterization: -Stable with no recurrent episodes  -Will continue carvedilol 12.5mg  twice daily, Imdur 30mg  daily  -If recurrent symptoms, could consider initiation of amlodipine given stable BP  -Denies dizziness or orthostasis  -BMET today   2. Hypertension: -Stable, 120/76 -Continue current regimen   3. Hyperlipidemia: -LDL, 79 on 05/15/2018 -Pt at goal given no personal hx of CAD  -Continue high intensity Lipitor. If continues to have LE pain, could consider decreasing dose, however doe not appear that her pain is related to dose change in medications   4.  Generalized pain: -Pt reports recent dx of fibromyalgia, followed by PCP  -If significant LE pain, consider reducing Lipitor  -Denies claudication symptoms   Current medicines are reviewed at length with the patient today.  The patient does not have concerns regarding medicines.  The following changes have been made:  no change  Labs/ tests ordered today include: BMET   Orders Placed This Encounter  Procedures  . Basic metabolic panel    Disposition:   FU with Dr. Radford Pax in 6 months or sooner if needed   Signed, Kathyrn Drown, NP  05/23/2018 11:54 AM    [address], [place], [place]  [address] Phone: [phone]; Fax: [phone]

## 2018-05-23 ENCOUNTER — Encounter: Payer: Self-pay | Admitting: Cardiology

## 2018-05-23 ENCOUNTER — Other Ambulatory Visit: Payer: Self-pay | Admitting: *Deleted

## 2018-05-23 ENCOUNTER — Ambulatory Visit: Payer: Medicare Other | Admitting: Cardiology

## 2018-05-23 VITALS — BP 120/76 | HR 82 | Ht 67.5 in | Wt 173.1 lb

## 2018-05-23 DIAGNOSIS — I10 Essential (primary) hypertension: Secondary | ICD-10-CM

## 2018-05-23 DIAGNOSIS — I214 Non-ST elevation (NSTEMI) myocardial infarction: Secondary | ICD-10-CM | POA: Diagnosis not present

## 2018-05-23 LAB — BASIC METABOLIC PANEL
BUN/Creatinine Ratio: 11 — ABNORMAL LOW (ref 12–28)
BUN: 11 mg/dL (ref 8–27)
CO2: 25 mmol/L (ref 20–29)
CREATININE: 0.97 mg/dL (ref 0.57–1.00)
Calcium: 9.6 mg/dL (ref 8.7–10.3)
Chloride: 101 mmol/L (ref 96–106)
GFR calc Af Amer: 71 mL/min/{1.73_m2} (ref >59)
GFR calc non Af Amer: 61 mL/min/{1.73_m2} (ref >59)
Glucose: 87 mg/dL (ref 65–99)
Potassium: 4.6 mmol/L (ref 3.5–5.2)
Sodium: 139 mmol/L (ref 134–144)

## 2018-05-23 NOTE — Patient Instructions (Signed)
Medication Instructions:  Your physician recommends that you continue on your current medications as directed. Please refer to the Current Medication list given to you today.  If you need a refill on your cardiac medications before your next appointment, please call your pharmacy.   Lab work: TODAY:  BMET  If you have labs (blood work) drawn today and your tests are completely normal, you will receive your results only by: Marland Kitchen MyChart Message (if you have MyChart) OR . A paper copy in the mail If you have any lab test that is abnormal or we need to change your treatment, we will call you to review the results.  Testing/Procedures: None ordered  Follow-Up: At [facility], you and your health needs are our priority.  As part of our continuing mission to provide you with exceptional heart care, we have created designated Provider Care Teams.  These Care Teams include your primary Cardiologist (physician) and Advanced Practice Providers (APPs -  Physician Assistants and Nurse Practitioners) who all work together to provide you with the care you need, when you need it. You will need a follow up appointment in 6 months.  Please call our office 2 months in advance to schedule this appointment.  You may see Fransico Him, MD or one of the following Advanced Practice Providers on your designated Care Team:   [place], PA-C Melina Copa, PA-C . Ermalinda Barrios, PA-C  Any Other Special Instructions Will Be Listed Below (If Applicable).

## 2018-05-30 ENCOUNTER — Emergency Department (HOSPITAL_COMMUNITY): Payer: Medicare Other

## 2018-05-30 ENCOUNTER — Other Ambulatory Visit: Payer: Self-pay | Admitting: Family Medicine

## 2018-05-30 ENCOUNTER — Emergency Department (HOSPITAL_COMMUNITY)
Admission: EM | Admit: 2018-05-30 | Discharge: 2018-05-30 | Disposition: A | Discharge status: Home / Self Care | Payer: Medicare Other | Attending: Emergency Medicine | Admitting: Emergency Medicine

## 2018-05-30 ENCOUNTER — Encounter (HOSPITAL_COMMUNITY): Payer: Self-pay | Admitting: Radiology

## 2018-05-30 ENCOUNTER — Ambulatory Visit: Payer: Self-pay | Admitting: *Deleted

## 2018-05-30 ENCOUNTER — Telehealth: Payer: Self-pay | Admitting: Family Medicine

## 2018-05-30 DIAGNOSIS — R079 Chest pain, unspecified: Secondary | ICD-10-CM | POA: Diagnosis not present

## 2018-05-30 DIAGNOSIS — Z87891 Personal history of nicotine dependence: Secondary | ICD-10-CM | POA: Diagnosis not present

## 2018-05-30 DIAGNOSIS — R29898 Other symptoms and signs involving the musculoskeletal system: Secondary | ICD-10-CM

## 2018-05-30 DIAGNOSIS — N182 Chronic kidney disease, stage 2 (mild): Secondary | ICD-10-CM | POA: Insufficient documentation

## 2018-05-30 DIAGNOSIS — I4581 Long QT syndrome: Secondary | ICD-10-CM | POA: Diagnosis not present

## 2018-05-30 DIAGNOSIS — I129 Hypertensive chronic kidney disease with stage 1 through stage 4 chronic kidney disease, or unspecified chronic kidney disease: Secondary | ICD-10-CM | POA: Insufficient documentation

## 2018-05-30 DIAGNOSIS — M25519 Pain in unspecified shoulder: Secondary | ICD-10-CM | POA: Diagnosis not present

## 2018-05-30 DIAGNOSIS — Z96651 Presence of right artificial knee joint: Secondary | ICD-10-CM | POA: Diagnosis not present

## 2018-05-30 DIAGNOSIS — R29818 Other symptoms and signs involving the nervous system: Secondary | ICD-10-CM | POA: Diagnosis not present

## 2018-05-30 DIAGNOSIS — R202 Paresthesia of skin: Secondary | ICD-10-CM

## 2018-05-30 DIAGNOSIS — Z8673 Personal history of transient ischemic attack (TIA), and cerebral infarction without residual deficits: Secondary | ICD-10-CM | POA: Diagnosis not present

## 2018-05-30 DIAGNOSIS — R200 Anesthesia of skin: Secondary | ICD-10-CM | POA: Diagnosis not present

## 2018-05-30 DIAGNOSIS — Z7982 Long term (current) use of aspirin: Secondary | ICD-10-CM | POA: Diagnosis not present

## 2018-05-30 DIAGNOSIS — R531 Weakness: Secondary | ICD-10-CM | POA: Diagnosis not present

## 2018-05-30 DIAGNOSIS — F449 Dissociative and conversion disorder, unspecified: Secondary | ICD-10-CM

## 2018-05-30 DIAGNOSIS — I10 Essential (primary) hypertension: Secondary | ICD-10-CM | POA: Diagnosis not present

## 2018-05-30 DIAGNOSIS — M6281 Muscle weakness (generalized): Secondary | ICD-10-CM | POA: Diagnosis not present

## 2018-05-30 DIAGNOSIS — Z79899 Other long term (current) drug therapy: Secondary | ICD-10-CM | POA: Insufficient documentation

## 2018-05-30 LAB — DIFFERENTIAL
Abs Immature Granulocytes: 0.02 10*3/uL (ref 0.00–0.07)
Basophils Absolute: 0 10*3/uL (ref 0.0–0.1)
Basophils Relative: 0 %
Eosinophils Absolute: 0.2 10*3/uL (ref 0.0–0.5)
Eosinophils Relative: 2 %
Immature Granulocytes: 0 %
Lymphocytes Relative: 27 %
Lymphs Abs: 2.1 10*3/uL (ref 0.7–4.0)
Monocytes Absolute: 0.7 10*3/uL (ref 0.1–1.0)
Monocytes Relative: 9 %
Neutro Abs: 4.7 10*3/uL (ref 1.7–7.7)
Neutrophils Relative %: 62 %

## 2018-05-30 LAB — CBC
HCT: 32.9 % — ABNORMAL LOW (ref 36.0–46.0)
Hemoglobin: 10.6 g/dL — ABNORMAL LOW (ref 12.0–15.0)
MCH: 30.6 pg (ref 26.0–34.0)
MCHC: 32.2 g/dL (ref 30.0–36.0)
MCV: 95.1 fL (ref 80.0–100.0)
Platelets: 255 10*3/uL (ref 150–400)
RBC: 3.46 MIL/uL — ABNORMAL LOW (ref 3.87–5.11)
RDW: 12.1 % (ref 11.5–15.5)
WBC: 7.7 10*3/uL (ref 4.0–10.5)
nRBC: 0 % (ref 0.0–0.2)

## 2018-05-30 LAB — COMPREHENSIVE METABOLIC PANEL
ALT: 24 U/L (ref 0–44)
AST: 19 U/L (ref 15–41)
Albumin: 4.4 g/dL (ref 3.5–5.0)
Alkaline Phosphatase: 53 U/L (ref 38–126)
Anion gap: 8 (ref 5–15)
BUN: 8 mg/dL (ref 8–23)
CO2: 25 mmol/L (ref 22–32)
Calcium: 9.7 mg/dL (ref 8.9–10.3)
Chloride: 105 mmol/L (ref 98–111)
Creatinine, Ser: 0.96 mg/dL (ref 0.44–1.00)
GFR calc Af Amer: >60 mL/min (ref >60)
GFR calc non Af Amer: >60 mL/min (ref >60)
Glucose, Bld: 95 mg/dL (ref 70–99)
Potassium: 3.9 mmol/L (ref 3.5–5.1)
Sodium: 138 mmol/L (ref 135–145)
Total Bilirubin: 1 mg/dL (ref 0.3–1.2)
Total Protein: 6.7 g/dL (ref 6.5–8.1)

## 2018-05-30 LAB — CBG MONITORING, ED: Glucose-Capillary: 90 mg/dL (ref 70–99)

## 2018-05-30 LAB — PROTIME-INR
INR: 1.1 (ref 0.8–1.2)
Prothrombin Time: 13.7 seconds (ref 11.4–15.2)

## 2018-05-30 LAB — I-STAT CREATININE, ED: Creatinine, Ser: 0.9 mg/dL (ref 0.44–1.00)

## 2018-05-30 LAB — APTT: aPTT: 34 seconds (ref 24–36)

## 2018-05-30 MED ORDER — SODIUM CHLORIDE 0.9% FLUSH: 3.0000 mL | Freq: Once | INTRAVENOUS | Status: DC

## 2018-05-30 MED ORDER — IOPAMIDOL (ISOVUE-370) INJECTION 76%
50.0000 mL | Freq: Once | INTRAVENOUS | Status: AC | PRN
  Administered 2018-05-30: 50 mL via INTRAVENOUS

## 2018-05-30 NOTE — Consult Note (Signed)
Neurology Consultation  Reason for Consult:  Code stroke Referring Physician: Dr. Wilson Singer  CC: Left-sided numbness, slurred speech  History is obtained from: Chart, patient  HPI: Erin Lopez is a 68 y.o. female PPMH of anxiety, bipolar disorder, COPD, Endometritis, Fibromyalgia, hyperlipidemia, Ischemic cardiomyopathy, remote alcoholism now in remission for 11 years, presents via EMS for stroke like symptoms. She recently had cardiac catheterization with recommendation for aggressive managemet of CAD. She was last normal around noon today when she started noticing sudden onset of left-sided numbness and weakness.  She also said that she has some left neck pain along with numbness on the left side.  EMS was called, the activated a code stroke because she was weak on the left on their exam.  Initial NIH stroke scale was 9 due to bilateral leg weakness, left arm weakness left facial droop left-sided numbness and some trouble with repetition. She reports some chest pain discomfort, left arm numbness.  No shortness of breath or cough.  No fevers chills. Denies headaches or visual symptoms.  Denies strokelike symptoms in the past.   LKW: 1200 hrs on 2/26 tpa given?: no, inconsistent exam Premorbid modified Rankin scale (mRS): 0   ROS: ROS was performed and is negative except as noted in the HPI.  Past Medical History:  Diagnosis Date  . Alcoholism in remission [facility])    states last alcohol 11 yrs ago  . Anxiety   . Asymptomatic gallstones   . Bipolar 1 disorder ([place])    Admission for mania x 2 (most recent 11/2017)  . Cervical spondylosis without myelopathy 12/19/2013   MRI 02/2014: multilevel DDD/spondylosis, not much change compared to prior MRI.  Pt not in favor of invasive therapy for her neck as of 04/2014.  Marland Kitchen COPD (chronic obstructive pulmonary disease) (HCC)    Moderate: anoro started 04/2017 by pulm, pt symptomatically improved and PFTs stable at f/u 06/2017--pulm f/u planned 1 yr.   . Dilutional hyponatremia   . Endometritis 05/2017   possible; empiric tx with Flagyl by GYN.  . Environmental allergies   . Fibromyalgia   . GERD (gastroesophageal reflux disease)   . Herpes zoster 08/07/2017   L scapular region  . Hiatal hernia   . History of adenomatous polyp of colon 04/2008; 08/2014   No high grade dysplasia: recall 5 yrs ([name], [name])  . History of basal cell carcinoma excision    NOSE  . History of Helicobacter pylori infection 04/2008   +gastric biopsy (gastritis but no metaplasia, dysplasia, or malignancy identified)  . History of TIA (transient ischemic attack)    secondary to HELLP syndrome 1994 post c/s--  residual memory loss  . Hyperlipidemia    no meds in > a decade per pt.  Recommended statin 06/21/16.  Marland Kitchen Hypertension   . Internal hemorrhoids   . Ischemic cardiomyopathy 05/2018   Echo EF 45-50% in context of NSTEMI from CA spasm.  . 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 04/2017.  . Memory loss    Abnl MRI brain and CT brain c/w chronic microvascular ischemia.  Repeat MRI brain 02/2014 stable (Dr. Mayra Reel with no plan to f/u with neuro as of 04/2014)  . NSTEMI (non-ST elevated myocardial infarction) ([place]) 05/2018   Cath showed distal LAD spasm; mod RCA dz.   Echo with some severe hypokinesis and EF 45-50%.  Pt started on indur 30mg  qd 05/2018.  . Osteoporosis 05/2010   2012 'penia; 06/2015 'porosis:  prolia.  repeat DEXA 2 yrs.  As of 12/2016 pt going to discuss w/GYN whether or not to continue prolia.  DEXA 09/2017 T-score -2.2 femur neck.  . Pelvic floor dysfunction    [facility] ([name])  . Postmenopausal vaginal bleeding 05/2017   x 1 small episode; GYN attempted endo bx but unable to penetrate cervix due to severe cerv stenosis (due to  postmenopausal state + hx of LEEP).  Endo u/s showed thin endo lining.  Per GYN---no evidence of endomet pathology on w/u---obs as of 07/2017.  Marland Kitchen Prediabetes 06/2016   Fasting gluc 105; HbA1c at that time was 6.0%.  A1c 6.2% 12/2017.  Marland Kitchen Recurrent kidney stones 08/2013   Left ureteral calculus: Perc nephr--cystoscopy w/ureteroscopy + laser for stone removal.  Residual asymptomatic left renal nephrolithiasis <68mm present post-procedure.  Right sided hydronephrosis-persistent (on u/s)--urol ordered CT to further eval 08/2016: 1.6 cm nonobstructing stone lower pole left kidney, no hydro, plan for PCN extraction ([facility])  . Shortness of breath   . Sprain of neck 12/19/2013  . TMJ (temporomandibular joint disorder)    USES  MOUTH GUARD  . Toe fracture, left 12/2017   2nd toe prox phalanx (immobilization, [name])  . Vitamin D deficiency 05/2011  . Weak urinary stream 07/2016   with elevated PVR and mild left hydronephrosis secondary to this ([facility] started flomax 0.4mg  qd for this).   Family History  Problem Relation Age of Onset  . Alcohol abuse Mother   . Arthritis Mother   . Hypertension Mother   . Hyperlipidemia Mother   . Diabetes Mother   . Parkinsonism Mother   . Breast cancer Mother   . Prostate cancer Father   . Hypertension Father   . Hyperlipidemia Father   . Diabetes Father   . Heart disease Father   . Esophageal cancer Sister   . Colon polyps Sister   . Colon polyps Brother   . Heart disease Brother        x 2  . Irritable bowel syndrome Sister    Social History:   reports that she quit smoking about 24 years ago. Her smoking use included cigarettes. She has a 33.00 pack-year smoking history. She has never used smokeless tobacco. She reports that she does not drink alcohol or use drugs.  Medications  Current Facility-Administered Medications:  .  sodium chloride flush (NS) 0.9 % injection 3 mL, 3 mL, Intravenous, Once, Virgel Manifold, MD  Current Outpatient  Medications:  .  acetaminophen (TYLENOL) 325 MG tablet, Take 650 mg by mouth every 6 (six) hours as needed for mild pain or headache., Disp: , Rfl:  .  albuterol (PROVENTIL HFA;VENTOLIN HFA) 108 (90 Base) MCG/ACT inhaler, Inhale 2 puffs into the lungs every 4 (four) hours as needed for wheezing or shortness of breath., Disp: , Rfl:  .  ALPRAZolam (XANAX) 1 MG tablet, Take 1 tablet (1 mg total) by mouth 3 (three) times daily., Disp: 270 tablet, Rfl: 1 .  aspirin EC 81 MG EC tablet, Take 1 tablet (81 mg total) by mouth daily., Disp: 90 tablet, Rfl: 3 .  atorvastatin (LIPITOR) 80 MG tablet, Take 1 tablet (80 mg total) by mouth daily at 6 PM., Disp: 90 tablet, Rfl: 3 .  carvedilol (COREG) 25 MG tablet, Take 12.5 mg by mouth 2 (two) times daily with a meal., Disp: , Rfl:  .  citalopram (CELEXA) 40 MG tablet, Take 40 mg by mouth daily., Disp: , Rfl:  .  cyclobenzaprine (FLEXERIL) 10 MG tablet, Take 10 mg by mouth 2 (two) times daily., Disp: , Rfl:  .  doxycycline (VIBRAMYCIN) 50 MG capsule, Take 50 mg by mouth 2 (two) times daily. , Disp: , Rfl: 3 .  famotidine (PEPCID) 40 MG tablet, Take 1 tablet (40 mg total) by mouth daily., Disp: 30 tablet, Rfl: 11 .  fluticasone (FLONASE) 50 MCG/ACT nasal spray, Place 2 sprays into both nostrils daily., Disp: , Rfl:  .  isosorbide mononitrate (IMDUR) 30 MG 24 hr tablet, Take 1 tablet (30 mg total) by mouth daily., Disp: 90 tablet, Rfl: 3 .  lamoTRIgine (LAMICTAL) 100 MG tablet, Take 3 tablets (300 mg total) by mouth at bedtime., Disp: 90 tablet, Rfl: 11 .  montelukast (SINGULAIR) 10 MG tablet, TAKE 1 TABLET (10 MG TOTAL) BY MOUTH EVERY EVENING., Disp: 90 tablet, Rfl: 4 .  Multiple Vitamin (MULTI-VITAMINS) TABS, Take 1 tablet by mouth daily. , Disp: , Rfl:  .  nitroGLYCERIN (NITROSTAT) 0.4 MG SL tablet, Place 1 tablet (0.4 mg total) under the tongue every 5 (five) minutes x 3 doses as needed for chest pain., Disp: 25 tablet, Rfl: 3 .  polyethylene glycol powder  (GLYCOLAX/MIRALAX) powder, TAKE 17 G BY MOUTH DAILY., Disp: 3162 g, Rfl: 0 .  Propylene Glycol (SYSTANE COMPLETE) 0.6 % SOLN, Apply 1 drop to eye 2 (two) times daily., Disp: , Rfl:  .  tamsulosin (FLOMAX) 0.4 MG CAPS capsule, Take 0.4 mg by mouth daily., Disp: , Rfl:  .  triamcinolone cream (KENALOG) 0.1 %, Apply 1 application topically daily., Disp: , Rfl:  .  umeclidinium-vilanterol (ANORO ELLIPTA) 62.5-25 MCG/INH AEPB, Inhale 1 puff into the lungs daily., Disp: , Rfl:  .  zolpidem (AMBIEN) 10 MG tablet, Take 1 tablet (10 mg total) by mouth at bedtime., Disp: 90 tablet, Rfl: 1  Exam: Current vital signs: Wt 77.7 kg   BMI 26.43 kg/m  Vital signs in last 24 hours: Weight:  [77.7 kg] 77.7 kg (02/26 1400)  GENERAL: Awake, alert in NAD HEENT: - Normocephalic and atraumatic, dry mm, no LN++, no Thyromegally LUNGS - Clear to auscultation bilaterally with no wheezes CV - S1S2 RRR, no m/r/g, equal pulses bilaterally. ABDOMEN - Soft, nontender, nondistended with normoactive BS Ext: warm, well perfused, intact peripheral pulses, no edema  NEURO:  Mental Status: AA&Ox3  Language: speech is at times clear and other times stuttering.  Naming, fluency, and comprehension intact.Repetition mildly impaired. Cranial Nerves: PERRL. EOMI, visual fields full, no obvious facial asymmetry but she tried to pull volitionally the right side of her face down-inconsistently, facial sensation decreased to light touch and splits vibration in the midline on the forehead, hearing intact, tongue/uvula/soft palate midline, normal sternocleidomastoid and trapezius muscle strength. No evidence of tongue atrophy or fibrillations Motor: Left upper extremity-she was able to hold antigravity but on repeat exam falls down before 10 seconds.  Right upper extremity-initially able to hold against gravity for about 7 to 8 seconds but could not hold up for all of 10 seconds and it suddenly dropped down to bed.  Both lower extremities  barely antigravity and falls down to bed. Tone: is normal and bulk is normal Sensation-decreased sensation to all modalities on the left Coordination: FTN intact bilaterally Gait- deferred  NIHSS-9 Labs I have reviewed labs in epic and the results pertinent to this consultation are:  CBC    Component Value  Date/Time   WBC 7.7 05/30/2018 1410   RBC 3.46 (L) 05/30/2018 1410   HGB 10.6 (L) 05/30/2018 1410   HCT 32.9 (L) 05/30/2018 1410   PLT 255 05/30/2018 1410   MCV 95.1 05/30/2018 1410   MCH 30.6 05/30/2018 1410   MCHC 32.2 05/30/2018 1410   RDW 12.1 05/30/2018 1410   LYMPHSABS 2.1 05/30/2018 1410   MONOABS 0.7 05/30/2018 1410   EOSABS 0.2 05/30/2018 1410   BASOSABS 0.0 05/30/2018 1410    CMP     Component Value Date/Time   NA 138 05/30/2018 1410   NA 139 05/23/2018 1152   K 3.9 05/30/2018 1410   CL 105 05/30/2018 1410   CO2 25 05/30/2018 1410   GLUCOSE 95 05/30/2018 1410   BUN 8 05/30/2018 1410   BUN 11 05/23/2018 1152   CREATININE 0.90 05/30/2018 1411   CREATININE 1.27 (H) 05/16/2016 1525   CALCIUM 9.7 05/30/2018 1410   PROT 6.7 05/30/2018 1410   ALBUMIN 4.4 05/30/2018 1410   AST 19 05/30/2018 1410   ALT 24 05/30/2018 1410   ALKPHOS 53 05/30/2018 1410   BILITOT 1.0 05/30/2018 1410   GFRNONAA >60 05/30/2018 1410   GFRAA >60 05/30/2018 1410   Lipid Panel     Component Value Date/Time   CHOL 158 05/15/2018 0307   TRIG 85 05/15/2018 0307   HDL 62 05/15/2018 0307   CHOLHDL 2.5 05/15/2018 0307   VLDL 17 05/15/2018 0307   LDLCALC 79 05/15/2018 0307   LDLDIRECT 144.1 05/29/2013 1156   Imaging I have reviewed the images obtained: CT-scan of the brain-no acute changes CTA head and neck-no LVO, no significant stenosis MRI examination of the brain-no evidence of acute stroke.  Assessment: 68 year old woman with past medical history as documented above presenting with sudden onset of left-sided weakness.  Her examination was very inconsistent, on sensory exam  she split vibratory sense in midline and mixed sides as to where the sensation was less. Her weakness also had a very nonphysiological pattern. I suspect there is a underlying psychological component to her presentation. Because of the fact that she has all the risk factors, I did obtain a stat MRI to evaluate for stroke, which was negative. CTA of the head and neck does not show any emergent large vessel occlusion, neither does not show any significant stenosis in the carotid system or the posterior circulation. She did not complain of any headache, so less likely complex migraine.  Impression: Non-physiological left-sided numbness/weakness  Recommendations: At this time, with a negative MRI, stroke is unlikely. Given the inconsistent exam, I do not suspect a true neurological etiology for her current presentation. I do not have any further recommendation as far as an inpatient work-up is considered.  -- Amie Portland, MD [facility] Pager: [phone] If 7pm to 7am, please call on call as listed on AMION.

## 2018-05-30 NOTE — Telephone Encounter (Signed)
Noted  

## 2018-05-30 NOTE — Telephone Encounter (Signed)
I called patient and spoke with her - EMS was at her home when I called.

## 2018-05-30 NOTE — Telephone Encounter (Signed)
Pt called with her b/p 156/95 HR of 103 and then 153/96 HR 87. She is also feeling pressure around her temples, shortness of breath, tightness in her jaws, and weakness in her left arm. She took her b/p med this morning. Also advised to take a NTG. She stated that she had a heart attack last week. Per chart it was on 05/14/18. She is having a hard time remembering. But she did remembered that she drove herself to the hospital. Advised to call 911 to get assessed by EMS. Pt disconnected the call. Will call her back to make sure she is calling 911.  Attempted to call patient back, went to voice mail, left message. Routing to [facility].  Reason for Disposition . [1] Weakness of the face, arm or leg on one side of the body AND [2] new onset  Answer Assessment - Initial Assessment Questions 1. BLOOD PRESSURE: "What is the blood pressure?" "Did you take at least two measurements 5 minutes apart?"     156/95 HR 103 about 30 mins ago and now 153/96 HR 87 2. ONSET: "When did you take your blood pressure?"     now 3. HOW: "How did you obtain the blood pressure?" (e.g., visiting nurse, automatic home BP monitor)     Automatic home BP monitor 4. HISTORY: "Do you have a history of high blood pressure?"     yes 5. MEDICATIONS: "Are you taking any medications for blood pressure?" "Have you missed any doses recently?"     Yes and have taken the meds this morning 6. OTHER SYMPTOMS: "Do you have any symptoms?" (e.g., headache, chest pain, blurred vision, difficulty breathing, weakness)     Pressure around the temps and back of neck, tight in jaws, shortness of breath (COPD), weakness in left arm 7. PREGNANCY: "Is there any chance you are pregnant?" "When was your last menstrual period?"     n/a  Protocols used: HIGH BLOOD PRESSURE-A-AH

## 2018-05-30 NOTE — ED Notes (Signed)
Patient verbalizes understanding of discharge instructions. Opportunity for questioning and answers were provided. Armband removed by staff, pt discharged from ED.  

## 2018-05-30 NOTE — ED Notes (Signed)
Pt transferred to MRI from CT

## 2018-05-30 NOTE — Telephone Encounter (Signed)
Flow at [facility] notified regarding this patient showing symptoms of a heart attack and was advised to call 911.  Pt had disconnected the call . When trying to call her back , no answer. Will try to call her back to see if she called 911.

## 2018-05-30 NOTE — Code Documentation (Signed)
68 yo female with hx of fibromyalgia, non-stemi, HTN, and HLD. Pt is coming from home where she reports having a sudden onset of left sided numbness and weakness that started at 1200. Pt recently had cardiac catheterization a week ago and complains that she is having left neck pain along with numbness on the left side. EMS called and activated a Code Stroke. Stroke Team met patient upon arrival. Initial NIHSS 9 due to bilateral leg weakness, left arm weakness, left facial droop, left sided numbness, and some trouble repeating phrases. CT Head negative for hemorrhage. CTA completed and showed no sign of LVO. Pt taken to MRI to rule out stroke. No stroke noted on MRI and symptoms have remained the same. Neurologist notified. No acute treatment at this time. Handoff given [place], Therapist, sports.

## 2018-05-30 NOTE — ED Triage Notes (Signed)
Pt arrives to ED from home with complaints of chest pain, left sided arm weakness, bilateral leg weakness, left sided facial droop and trouble speaking with a last known normal at 1200. EMS reports that the patient called out for chest pain and left sided shoulder pain but when EMS arrived and assessed the patient that's when the patient realized the neuro deficits. Code Stroke called via EMS.

## 2018-05-30 NOTE — ED Notes (Signed)
Pt's CBG result was 90. Informed [place] - RN.

## 2018-05-31 ENCOUNTER — Ambulatory Visit: Payer: Self-pay | Admitting: *Deleted

## 2018-05-31 NOTE — Telephone Encounter (Signed)
Pt advised and voiced understanding.   

## 2018-05-31 NOTE — Telephone Encounter (Signed)
I have reviewed records of pt's recent hospitalizations. Pls reassure her that f/u on 3/4 is fine. Reassure her that her heart if functioning well. She should not check her blood pressure more than once per day b/c I don't want her to become fixated on this b/c it makes her more anxious. -thx

## 2018-05-31 NOTE — Telephone Encounter (Signed)
FYI

## 2018-05-31 NOTE — Telephone Encounter (Signed)
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  Reason for Disposition . Requesting regular office appointment  Answer Assessment - Initial Assessment Questions 1. REASON FOR CALL or QUESTION: "What is your reason for calling today?" or "How can I best help you?" or "What question do you have that I can help answer?"     Patient would like follow up appointment  Protocols used: INFORMATION ONLY CALL-A-AH

## 2018-06-04 NOTE — Telephone Encounter (Signed)
Called patient to see if she was interested in participating in the Cardiac Rehab Program. Patient stated yes. Patient will come in for orientation on 06/28/2018 @ 130PM and will attend the 1:15PM exercise class. Went over insurance, patient verbalized understanding.  Mailed homework package.

## 2018-06-05 ENCOUNTER — Other Ambulatory Visit: Payer: Self-pay | Admitting: *Deleted

## 2018-06-05 NOTE — Patient Outreach (Signed)
[facility]) Care Management  06/05/2018  Erin Lopez 25-Mar-1951 [id number]   Telephone Screen  Referral Date:06/01/18 Referral Source: [facility] referral  Referral Reason: "**[employer] member, phone [phone]** Member is questioning some of her medications (believes they may be causing her blood pressure to elevate). She is waiting on her PCP to call with a sooner appointment. Member lives alone, cares for self, and drives. She called her PCP, was sent to ED due to having a hear attack 05/14/2018." Insurance:[employer] medicare  Admission 05/14/18  ED 05/30/18 elevated BP    Outreach attempt # 1 was successful to her home number Patient is able to verify HIPAA Reviewed and addressed referral to [facility] with patient She confirms an admission for NSTEMI on 05/14/18  She confirms the referral information that her BP has been elevated but informs CM she is scheduled to see her MD on 06/06/18 to work on finding an effective BP medicine and is scheduled to start cardiac rehab on 06/28/2018 She report she is excited about cardiac rehab and until that time she will rest mainly in her bed She does admit to having some depression but reports she will speak with her MD about as she reports this is not different or new.  CM offered [facility] CM and SW services for counseling resources CM allowed Mrs Erin Lopez to ventilate her feelings and provided support during the call Mrs Erin Lopez denied need of [facility] services  Social:  Mrs Erin Lopez lives alone but has support of her brother, Erin Lopez, friends and daughter, Erin Lopez. She is disabled and widowed She denies issues with transportation to medical appointments or difficulty with her care needs  [facility]   Conditions: May 14 2018 NSTEMI, HTN, chronic constipation, COPD, TIA, neck surgeries, osteoporosis, Lopez stone hx, "Lopez issues" bipolar 1 disorder memory difficulties, HDL, , prediabetes,   Hyponatremia hx    Medications: She denies concerns with  taking medications as prescribed, affording medications, side effects of medications and questions about medications She reports she got extra help with her medication   Appointments: 06/06/18 primary Dr Erin Lopez  Cardiac rehab to start 06/28/18  Advance Directives: has a living will and POA   Consent: [facility] RN CM reviewed [facility] services with patient. Patient gave verbal consent for services.  Plan: [facility] RN CM will close case at this time as patient has been assessed and no needs identified/needs resolved.   Pt encouraged to return a call to [place] CM prn  [facility] RN CM sent a successful outreach letter as discussed with [facility] brochure enclosed for review   L. Lavina Hamman, RN, BSN, [place] Coordinator Office number [phone] Mobile number [phone]  Main [facility] number [phone] Fax number [phone]

## 2018-06-06 ENCOUNTER — Ambulatory Visit (INDEPENDENT_AMBULATORY_CARE_PROVIDER_SITE_OTHER): Payer: Medicare Other | Admitting: Family Medicine

## 2018-06-06 ENCOUNTER — Encounter: Payer: Self-pay | Admitting: Family Medicine

## 2018-06-06 VITALS — BP 127/79 | HR 69 | Temp 97.6°F | Resp 16 | Ht 67.5 in | Wt 171.4 lb

## 2018-06-06 DIAGNOSIS — I214 Non-ST elevation (NSTEMI) myocardial infarction: Secondary | ICD-10-CM | POA: Diagnosis not present

## 2018-06-06 DIAGNOSIS — F3162 Bipolar disorder, current episode mixed, moderate: Secondary | ICD-10-CM | POA: Diagnosis not present

## 2018-06-06 DIAGNOSIS — I519 Heart disease, unspecified: Secondary | ICD-10-CM

## 2018-06-06 DIAGNOSIS — I5189 Other ill-defined heart diseases: Secondary | ICD-10-CM

## 2018-06-06 DIAGNOSIS — I201 Angina pectoris with documented spasm: Secondary | ICD-10-CM | POA: Diagnosis not present

## 2018-06-06 MED ORDER — ARIPIPRAZOLE 10 MG PO TABS: 10.0000 mg | ORAL_TABLET | Freq: Every day | ORAL | 0 refills | Status: DC
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
## 2018-06-06 NOTE — ED Provider Notes (Signed)
[facility] EMERGENCY DEPARTMENT Provider Note   CSN: [id number] Arrival date & time: 05/30/18  1402    History   Chief Complaint Chief Complaint  Patient presents with  . Code Stroke    HPI Erin Lopez is a 68 y.o. female.     HPI   32yF with stroke like symptoms. She was last normal around noon today when she started noticing sudden onset of left-sided numbness and weakness.  She also said that she has some left neck pain along with numbness on the left side.  EMS was called, the activated a code stroke because she was weak on the left on their exam.  Initial NIH stroke scale was 9 due to bilateral leg weakness, left arm weakness left facial droop left-sided numbness and some trouble with repetition.  Past Medical History:  Diagnosis Date  . Alcoholism in remission [facility])    states last alcohol 11 yrs ago  . Anxiety   . Asymptomatic gallstones   . Bipolar 1 disorder ([place])    Admission for mania x 2 (most recent 11/2017)  . Cervical spondylosis without myelopathy 12/19/2013   MRI 02/2014: multilevel DDD/spondylosis, not much change compared to prior MRI.  Pt not in favor of invasive therapy for her neck as of 04/2014.  Marland Kitchen COPD (chronic obstructive pulmonary disease) (HCC)    Moderate: anoro started 04/2017 by pulm, pt symptomatically improved and PFTs stable at f/u 06/2017--pulm f/u planned 1 yr.  . Dilutional hyponatremia   . Endometritis 05/2017   possible; empiric tx with Flagyl by GYN.  . Environmental allergies   . Fibromyalgia   . GERD (gastroesophageal reflux disease)   . Herpes zoster 08/07/2017   L scapular region  . Hiatal hernia   . History of adenomatous polyp of colon 04/2008; 08/2014   No high grade dysplasia: recall 5 yrs ([name], [name])  . History of basal cell carcinoma excision    NOSE  . History of Helicobacter pylori infection 04/2008   +gastric biopsy (gastritis but no metaplasia, dysplasia, or malignancy identified)  .  History of TIA (transient ischemic attack)    secondary to HELLP syndrome 1994 post c/s--  residual memory loss  . Hyperlipidemia    no meds in > a decade per pt.  Recommended statin 06/21/16.  Marland Kitchen Hypertension   . Internal hemorrhoids   . Ischemic cardiomyopathy 05/2018   Echo EF 45-50% in context of NSTEMI from CA spasm.  . 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 04/2017.  . Memory loss    Abnl MRI brain and CT brain c/w chronic microvascular ischemia.  Repeat MRI brain 02/2014 stable (Dr. Mayra Reel with no plan to f/u with neuro as of 04/2014)  . NSTEMI (non-ST elevated myocardial infarction) ([place]) 05/2018   Cath showed distal LAD spasm; mod RCA dz.   Echo with some severe hypokinesis and EF 45-50%. Pt started on indur 30mg  qd 05/2018.  . Osteoporosis 05/2010   2012 'penia; 06/2015 'porosis:  prolia.  repeat DEXA 2 yrs.  As of 12/2016 pt going to discuss w/GYN whether or not to continue prolia.  DEXA 09/2017 T-score -2.2 femur neck.  . Pelvic floor dysfunction    [facility] ([name])  . Postmenopausal vaginal bleeding 05/2017   x 1 small episode; GYN attempted endo bx but unable to penetrate  cervix due to severe cerv stenosis (due to postmenopausal state + hx of LEEP).  Endo u/s showed thin endo lining.  Per GYN---no evidence of endomet pathology on w/u---obs as of 07/2017.  Marland Kitchen Prediabetes 06/2016   Fasting gluc 105; HbA1c at that time was 6.0%.  A1c 6.2% 12/2017.  Marland Kitchen Recurrent kidney stones 08/2013   Left ureteral calculus: Perc nephr--cystoscopy w/ureteroscopy + laser for stone removal.  Residual asymptomatic left renal nephrolithiasis <32mm present post-procedure.  Right sided hydronephrosis-persistent (on u/s)--urol ordered CT to further eval 08/2016: 1.6 cm nonobstructing stone lower pole left kidney, no  hydro, plan for PCN extraction ([facility])  . Shortness of breath   . Sprain of neck 12/19/2013  . TMJ (temporomandibular joint disorder)    USES  MOUTH GUARD  . Toe fracture, left 12/2017   2nd toe prox phalanx (immobilization, [name])  . Vitamin D deficiency 05/2011  . Weak urinary stream 07/2016   with elevated PVR and mild left hydronephrosis secondary to this ([facility] started flomax 0.4mg  qd for this).    Patient Active Problem List   Diagnosis Date Noted  . NSTEMI (non-ST elevated myocardial infarction) ([place]) 05/14/2018  . Acute encephalopathy 11/19/2017  . Hyponatremia 11/16/2017  . Bipolar 1 disorder ([place]) 12/29/2016  . Hypertension 12/29/2016  . TIA (transient ischemic attack) 12/29/2016  . Chronic pelvic pain in female 08/22/2016  . Stage 2 chronic kidney disease 08/22/2016  . Osteoporosis 09/30/2015  . Health maintenance examination 06/11/2015  . Suprapubic discomfort 06/05/2015  . Urethral caruncle 06/05/2015  . Infection of urinary tract 06/05/2015  . TMJ (temporomandibular joint disorder) 02/25/2015  . Right ankle injury 11/05/2014  . Cervical spondylosis without myelopathy 12/19/2013  . Sprain of neck 12/19/2013  . Memory difficulties 12/19/2013  . Postnasal drip 10/28/2013  . Renal calculus 08/05/2013  . Urinary frequency 08/01/2013  . Constipation, chronic 07/29/2013  . COPD (chronic obstructive pulmonary disease) ([place]) 06/30/2013  . Abnormal chest x-ray 06/30/2013  . Benign essential HTN 05/29/2013  . Kidney stone on left side 05/29/2013  . Hyperlipidemia 05/29/2013    Past Surgical History:  Procedure Laterality Date  . ANTERIOR CERVICAL DECOMP/DISCECTOMY FUSION  06-30-2009   C3 -- C5  AND EXPLORATION OF FUSION C5-7 W/  PLATE REMOVAL  . BUNIONECTOMY Left   . CERVICAL FUSION  2002   anterior C5 -- C7  . CERVICAL SPINE SURGERY  08-27-1999     C6 -- T1  LAMINECTOMY/  DISKECTOMY  . CESAREAN SECTION  1994  . COLONOSCOPY W/ POLYPECTOMY   04/2008;08/2014   2016 tubular adenoma x 1; +diverticulosis and int/ext hemorrhoids.  Recall 5 yrs  . CYSTOSCOPY WITH URETEROSCOPY AND STENT PLACEMENT Left 08/28/2013   Procedure: CYSTOSCOPY, RGP,  WITH URETEROSCOPY AND STENT REMOVAL;  Surgeon: [name], MD;  Location: [facility];  Service: Urology;  Laterality: Left;  . DEXA  09/2017   T-score -2.2 femur neck: improved compared to 06/2015.  Marland Kitchen ESOPHAGOGASTRODUODENOSCOPY  2010; 08/2014   +Candidal esophagitis; Mild chronic gastritis w/intestinal metaplasia--NEG H pylori, neg for eosinophilic esoph  . HOLMIUM LASER APPLICATION Left 2/68/3419   Procedure: HOLMIUM LASER APPLICATION;  Surgeon: [name], MD;  Location: [facility];  Service: Urology;  Laterality: Left;  . LEFT HEART CATH AND CORONARY ANGIOGRAPHY N/A 05/15/2018   Evidence for spasm in distal LAD.  Mod RCA atherosclerosis, o/w no CAD.  Procedure: LEFT HEART CATH AND CORONARY ANGIOGRAPHY;  Surgeon: Leonie Man, MD;  Location: [facility]  INVASIVE CV LAB;  Service: Cardiovascular;  Laterality: N/A;  . NEGATIVE SLEEP STUDY  04-01-2007  . NEPHROLITHOTOMY Left 08/05/2013   Procedure: NEPHROLITHOTOMY PERCUTANEOUS;  Surgeon: [name], MD;  Location: [facility];  Service: Urology;  Laterality: Left;  . TONSILLECTOMY AND ADENOIDECTOMY  1975  . TOTAL KNEE ARTHROPLASTY Right 2003  . TRANSTHORACIC ECHOCARDIOGRAM  05/2018   (after MI secondary to arterial spasm): EF 45-50%; severe hypokinesis of apical anterolateral, inferolateral , anterior, and inferior LV myocardium.  Marland Kitchen ULTRASOUND EXAM,PELVIC COMPLETE ([facility] HX)  06/25/15   NORMAL (done by GYN)     OB History    Gravida  3   Para  1   Term      Preterm      AB      Living  1     SAB      TAB      Ectopic      Multiple      Live Births               Home Medications    Prior to Admission medications   Medication Sig Start Date End Date Taking? Authorizing Provider  acetaminophen  (TYLENOL) 325 MG tablet Take 650 mg by mouth every 6 (six) hours as needed for mild pain or headache.    [provider]  albuterol (PROVENTIL HFA;VENTOLIN HFA) 108 (90 Base) MCG/ACT inhaler Inhale 2 puffs into the lungs every 4 (four) hours as needed for wheezing or shortness of breath.    [provider]  ALPRAZolam Duanne Moron) 1 MG tablet Take 1 tablet (1 mg total) by mouth 3 (three) times daily. 03/26/18   Tammi Sou, MD  aspirin EC 81 MG EC tablet Take 1 tablet (81 mg total) by mouth daily. 05/17/18   Duke, Tami Lin, PA  atorvastatin (LIPITOR) 80 MG tablet Take 1 tablet (80 mg total) by mouth daily at 6 PM. 05/16/18   Duke, Tami Lin, PA  carvedilol (COREG) 25 MG tablet Take 12.5 mg by mouth 2 (two) times daily with a meal.    [provider]  citalopram (CELEXA) 40 MG tablet Take 40 mg by mouth daily.    [provider]  cyclobenzaprine (FLEXERIL) 10 MG tablet Take 10 mg by mouth 2 (two) times daily.    [provider]  doxycycline (VIBRAMYCIN) 50 MG capsule Take 50 mg by mouth 2 (two) times daily.  01/09/18   [provider]  famotidine (PEPCID) 40 MG tablet Take 1 tablet (40 mg total) by mouth daily. 12/06/17   McGowen, Adrian Blackwater, MD  fluticasone (FLONASE) 50 MCG/ACT nasal spray Place 2 sprays into both nostrils daily.    [provider]  isosorbide mononitrate (IMDUR) 30 MG 24 hr tablet Take 1 tablet (30 mg total) by mouth daily. 05/17/18   Duke, Tami Lin, PA  lamoTRIgine (LAMICTAL) 100 MG tablet Take 3 tablets (300 mg total) by mouth at bedtime. 11/29/17   McGowen, Adrian Blackwater, MD  montelukast (SINGULAIR) 10 MG tablet TAKE 1 TABLET (10 MG TOTAL) BY MOUTH EVERY EVENING. 05/02/18   McGowen, Adrian Blackwater, MD  Multiple Vitamin (MULTI-VITAMINS) TABS Take 1 tablet by mouth daily.     [provider]  nitroGLYCERIN (NITROSTAT) 0.4 MG SL tablet Place 1 tablet (0.4 mg total) under the tongue every 5 (five) minutes x 3 doses as  needed for chest pain. 05/16/18   Duke, Tami Lin, PA  polyethylene glycol powder (GLYCOLAX/MIRALAX) powder TAKE 17 G  BY MOUTH DAILY. 06/19/17   McGowen, Adrian Blackwater, MD  Propylene Glycol (SYSTANE COMPLETE) 0.6 % SOLN Apply 1 drop to eye 2 (two) times daily.    [provider]  tamsulosin (FLOMAX) 0.4 MG CAPS capsule Take 0.4 mg by mouth daily.    [provider]  triamcinolone cream (KENALOG) 0.1 % Apply 1 application topically daily. 05/21/18   [provider]  umeclidinium-vilanterol (ANORO ELLIPTA) 62.5-25 MCG/INH AEPB Inhale 1 puff into the lungs daily.    [provider]  zolpidem (AMBIEN) 10 MG tablet Take 1 tablet (10 mg total) by mouth at bedtime. 02/14/18   McGowen, Adrian Blackwater, MD    Family History Family History  Problem Relation Age of Onset  . Alcohol abuse Mother   . Arthritis Mother   . Hypertension Mother   . Hyperlipidemia Mother   . Diabetes Mother   . Parkinsonism Mother   . Breast cancer Mother   . Prostate cancer Father   . Hypertension Father   . Hyperlipidemia Father   . Diabetes Father   . Heart disease Father   . Esophageal cancer Sister   . Colon polyps Sister   . Colon polyps Brother   . Heart disease Brother        x 2  . Irritable bowel syndrome Sister     Social History Social History   Tobacco Use  . Smoking status: Former Smoker    Packs/day: 1.00    Years: 33.00    Pack years: 33.00    Types: Cigarettes    Last attempt to quit: 08/22/1993    Years since quitting: 24.8  . Smokeless tobacco: Never Used  Substance Use Topics  . Alcohol use: No    Alcohol/week: 0.0 standard drinks    Comment: ALCOHOLIC IN REMISSION SINCE  2004  . Drug use: No     Allergies   Ceftin [cefuroxime axetil]; Ciprofloxacin; Fosamax [alendronate sodium]; Morphine and related; Prednisone; and Seroquel [quetiapine]   Review of Systems Review of Systems  All systems reviewed and negative, other than as noted in  HPI.  Physical Exam Updated Vital Signs BP 134/75 (BP Location: Left Arm)   Pulse 81   Temp 98 F (36.7 C) (Oral)   Resp 19   Wt 77.7 kg   SpO2 97%   BMI 26.43 kg/m   Physical Exam Vitals signs and nursing note reviewed.  Constitutional:      General: She is not in acute distress.    Appearance: She is well-developed.  HENT:     Head: Normocephalic and atraumatic.  Eyes:     General:        Right eye: No discharge.        Left eye: No discharge.     Conjunctiva/sclera: Conjunctivae normal.  Neck:     Musculoskeletal: Neck supple.  Cardiovascular:     Rate and Rhythm: Normal rate and regular rhythm.     Heart sounds: Normal heart sounds. No murmur. No friction rub. No gallop.   Pulmonary:     Effort: Pulmonary effort is normal. No respiratory distress.     Breath sounds: Normal breath sounds.  Abdominal:     General: There is no distension.     Palpations: Abdomen is soft.     Tenderness: There is no abdominal tenderness.  Musculoskeletal:        General: No tenderness.  Skin:    General: Skin is warm and dry.  Neurological:  General: No focal deficit present.     Mental Status: She is alert and oriented to person, place, and time.     Cranial Nerves: No cranial nerve deficit.     Sensory: No sensory deficit.     Motor: No weakness.     Coordination: Coordination normal.  Psychiatric:        Behavior: Behavior normal.        Thought Content: Thought content normal.      ED Treatments / Results  Labs (all labs ordered are listed, but only abnormal results are displayed) Labs Reviewed  CBC - Abnormal; Notable for the following components:      Result Value   RBC 3.46 (*)    Hemoglobin 10.6 (*)    HCT 32.9 (*)    All other components within normal limits  PROTIME-INR  APTT  DIFFERENTIAL  COMPREHENSIVE METABOLIC PANEL  CBG MONITORING, ED  I-STAT CREATININE, ED    EKG EKG Interpretation  Date/Time:  Wednesday May 30 2018 15:13:26  EST Ventricular Rate:  74 PR Interval:    QRS Duration: 97 QT Interval:  458 QTC Calculation: 509 R Axis:   53 Text Interpretation:  Sinus rhythm Nonspecific T abnormalities, diffuse leads Prolonged QT interval No STEMI  Confirmed by Nanda Quinton [phone]) on 05/30/2018 3:21:45 PM Also confirmed by Nanda Quinton [phone]), editor Lynder Parents [phone])  on 05/31/2018 9:28:51 AM   Radiology No results found.   Ct Angio Head W Or Wo Contrast  Result Date: 05/30/2018 CLINICAL DATA:  Code stroke.  Focal neurologic deficit. EXAM: CT ANGIOGRAPHY HEAD AND NECK TECHNIQUE: Multidetector CT imaging of the head and neck was performed using the standard protocol during bolus administration of intravenous contrast. Multiplanar CT image reconstructions and MIPs were obtained to evaluate the vascular anatomy. Carotid stenosis measurements (when applicable) are obtained utilizing NASCET criteria, using the distal internal carotid diameter as the denominator. CONTRAST:  83mL ISOVUE-370 IOPAMIDOL (ISOVUE-370) INJECTION 76% COMPARISON:  None. FINDINGS: Brain: There is no mass, hemorrhage or extra-axial collection. The size and configuration of the ventricles and extra-axial CSF spaces are normal. There is hypoattenuation of the periventricular white matter, most commonly indicating chronic ischemic microangiopathy. Vascular: No abnormal hyperdensity of the major intracranial arteries or dural venous sinuses. No intracranial atherosclerosis. Skull: The visualized skull base, calvarium and extracranial soft tissues are normal. Sinuses/Orbits: No fluid levels or advanced mucosal thickening of the visualized paranasal sinuses. No mastoid or middle ear effusion. The orbits are normal. ASPECTS ([place] Stroke Program Early CT Score) - Ganglionic level infarction (caudate, lentiform nuclei, internal capsule, insula, M1-M3 cortex): 7 - Supraganglionic infarction (M4-M6 cortex): 3 Total score (0-10 with 10 being normal): 10 CTA NECK  FINDINGS SKELETON: There is no bony spinal canal stenosis. No lytic or blastic lesion. OTHER NECK: Normal pharynx, larynx and major salivary glands. No cervical lymphadenopathy. Unremarkable thyroid gland. UPPER CHEST: Biapical emphysema AORTIC ARCH: There is mild calcific atherosclerosis of the aortic arch. There is no aneurysm, dissection or hemodynamically significant stenosis of the visualized ascending aorta and aortic arch. Conventional 3 vessel aortic branching pattern. The visualized proximal subclavian arteries are widely patent. RIGHT CAROTID SYSTEM: --Common carotid artery: Widely patent origin without common carotid artery dissection or aneurysm. --Internal carotid artery: Normal without aneurysm, dissection or stenosis. --External carotid artery: No acute abnormality. LEFT CAROTID SYSTEM: --Common carotid artery: Widely patent origin without common carotid artery dissection or aneurysm. --Internal carotid artery: Normal without aneurysm, dissection or stenosis. --External carotid artery: No acute  abnormality. VERTEBRAL ARTERIES: Left dominant configuration. Both origins are normal. No dissection, occlusion or flow-limiting stenosis to the vertebrobasilar confluence. CTA HEAD FINDINGS POSTERIOR CIRCULATION: --Basilar artery: Normal. --Posterior cerebral arteries: Normal. Both originate from the basilar artery. --Superior cerebellar arteries: Normal. --Inferior cerebellar arteries: Normal anterior and posterior inferior cerebellar arteries. ANTERIOR CIRCULATION: --Intracranial internal carotid arteries: Normal. --Anterior cerebral arteries: Normal. Both A1 segments are present. Patent anterior communicating artery. --Middle cerebral arteries: Normal. --Posterior communicating arteries: Absent bilaterally. VENOUS SINUSES: As permitted by contrast timing, patent. ANATOMIC VARIANTS: None DELAYED PHASE: No parenchymal contrast enhancement. Review of the MIP images confirms the above findings. IMPRESSION: 1.  No acute intracranial abnormality.  ASPECTS is 10. 2. No emergent large vessel occlusion or hemodynamically significant stenosis of the head and neck. 3.  Aortic atherosclerosis (ICD10-I70.0). These results were communicated to Dr. Amie Portland at 2:55 pm on 05/30/2018 by text page via the [facility] messaging system. Electronically Signed   By: Ulyses Jarred M.D.   On: 05/30/2018 15:06   Dg Chest 2 View  Result Date: 05/14/2018 CLINICAL DATA:  Cough and chest pain EXAM: CHEST - 2 VIEW COMPARISON:  November 19, 2017 FINDINGS: There is no edema or consolidation. The heart size and pulmonary vascularity are normal. No adenopathy. No bone lesions. There is postoperative change in the lower cervical spine as well as in the right cervical-thoracic junction region. IMPRESSION: No edema or consolidation.  Stable cardiac silhouette. Electronically Signed   By: Lowella Grip III M.D.   On: 05/14/2018 12:35   Ct Angio Neck W Or Wo Contrast  Result Date: 05/30/2018 CLINICAL DATA:  Code stroke.  Focal neurologic deficit. EXAM: CT ANGIOGRAPHY HEAD AND NECK TECHNIQUE: Multidetector CT imaging of the head and neck was performed using the standard protocol during bolus administration of intravenous contrast. Multiplanar CT image reconstructions and MIPs were obtained to evaluate the vascular anatomy. Carotid stenosis measurements (when applicable) are obtained utilizing NASCET criteria, using the distal internal carotid diameter as the denominator. CONTRAST:  62mL ISOVUE-370 IOPAMIDOL (ISOVUE-370) INJECTION 76% COMPARISON:  None. FINDINGS: Brain: There is no mass, hemorrhage or extra-axial collection. The size and configuration of the ventricles and extra-axial CSF spaces are normal. There is hypoattenuation of the periventricular white matter, most commonly indicating chronic ischemic microangiopathy. Vascular: No abnormal hyperdensity of the major intracranial arteries or dural venous sinuses. No intracranial atherosclerosis.  Skull: The visualized skull base, calvarium and extracranial soft tissues are normal. Sinuses/Orbits: No fluid levels or advanced mucosal thickening of the visualized paranasal sinuses. No mastoid or middle ear effusion. The orbits are normal. ASPECTS ([place] Stroke Program Early CT Score) - Ganglionic level infarction (caudate, lentiform nuclei, internal capsule, insula, M1-M3 cortex): 7 - Supraganglionic infarction (M4-M6 cortex): 3 Total score (0-10 with 10 being normal): 10 CTA NECK FINDINGS SKELETON: There is no bony spinal canal stenosis. No lytic or blastic lesion. OTHER NECK: Normal pharynx, larynx and major salivary glands. No cervical lymphadenopathy. Unremarkable thyroid gland. UPPER CHEST: Biapical emphysema AORTIC ARCH: There is mild calcific atherosclerosis of the aortic arch. There is no aneurysm, dissection or hemodynamically significant stenosis of the visualized ascending aorta and aortic arch. Conventional 3 vessel aortic branching pattern. The visualized proximal subclavian arteries are widely patent. RIGHT CAROTID SYSTEM: --Common carotid artery: Widely patent origin without common carotid artery dissection or aneurysm. --Internal carotid artery: Normal without aneurysm, dissection or stenosis. --External carotid artery: No acute abnormality. LEFT CAROTID SYSTEM: --Common carotid artery: Widely patent origin without common carotid artery dissection or  aneurysm. --Internal carotid artery: Normal without aneurysm, dissection or stenosis. --External carotid artery: No acute abnormality. VERTEBRAL ARTERIES: Left dominant configuration. Both origins are normal. No dissection, occlusion or flow-limiting stenosis to the vertebrobasilar confluence. CTA HEAD FINDINGS POSTERIOR CIRCULATION: --Basilar artery: Normal. --Posterior cerebral arteries: Normal. Both originate from the basilar artery. --Superior cerebellar arteries: Normal. --Inferior cerebellar arteries: Normal anterior and posterior inferior  cerebellar arteries. ANTERIOR CIRCULATION: --Intracranial internal carotid arteries: Normal. --Anterior cerebral arteries: Normal. Both A1 segments are present. Patent anterior communicating artery. --Middle cerebral arteries: Normal. --Posterior communicating arteries: Absent bilaterally. VENOUS SINUSES: As permitted by contrast timing, patent. ANATOMIC VARIANTS: None DELAYED PHASE: No parenchymal contrast enhancement. Review of the MIP images confirms the above findings. IMPRESSION: 1. No acute intracranial abnormality.  ASPECTS is 10. 2. No emergent large vessel occlusion or hemodynamically significant stenosis of the head and neck. 3.  Aortic atherosclerosis (ICD10-I70.0). These results were communicated to Dr. Amie Portland at 2:55 pm on 05/30/2018 by text page via the [facility] messaging system. Electronically Signed   By: Ulyses Jarred M.D.   On: 05/30/2018 15:06   Mr Brain Wo Contrast  Result Date: 05/30/2018 CLINICAL DATA:  Sudden onset left-sided numbness and weakness. EXAM: MRI HEAD WITHOUT CONTRAST TECHNIQUE: Multiplanar, multiecho pulse sequences of the brain and surrounding structures were obtained without intravenous contrast. COMPARISON:  Head CT and CTA 05/30/2018.  Brain MRI 11/20/2017. FINDINGS: The patient terminated the examination before a sagittal T1 sequence was obtained. Brain: There is no evidence of acute infarct, intracranial hemorrhage, mass, midline shift, or extra-axial fluid collection. Patchy T2 hyperintensities in the cerebral white matter similar to the prior MRI. There is mild cerebral atrophy. Vascular: Major intracranial vascular flow voids are preserved. Skull and upper cervical spine: Unremarkable bone marrow signal. Sinuses/Orbits: Unremarkable orbits. Paranasal sinuses and mastoid air cells are clear. Other: None. IMPRESSION: 1. No acute intracranial abnormality. 2. Mild-to-moderate cerebral white matter disease, nonspecific though most often seen from chronic small vessel  ischemia. Electronically Signed   By: Logan Bores M.D.   On: 05/30/2018 15:20   Ct Head Code Stroke Wo Contrast  Result Date: 05/30/2018 CLINICAL DATA:  Code stroke.  Focal neurologic deficit. EXAM: CT ANGIOGRAPHY HEAD AND NECK TECHNIQUE: Multidetector CT imaging of the head and neck was performed using the standard protocol during bolus administration of intravenous contrast. Multiplanar CT image reconstructions and MIPs were obtained to evaluate the vascular anatomy. Carotid stenosis measurements (when applicable) are obtained utilizing NASCET criteria, using the distal internal carotid diameter as the denominator. CONTRAST:  64mL ISOVUE-370 IOPAMIDOL (ISOVUE-370) INJECTION 76% COMPARISON:  None. FINDINGS: Brain: There is no mass, hemorrhage or extra-axial collection. The size and configuration of the ventricles and extra-axial CSF spaces are normal. There is hypoattenuation of the periventricular white matter, most commonly indicating chronic ischemic microangiopathy. Vascular: No abnormal hyperdensity of the major intracranial arteries or dural venous sinuses. No intracranial atherosclerosis. Skull: The visualized skull base, calvarium and extracranial soft tissues are normal. Sinuses/Orbits: No fluid levels or advanced mucosal thickening of the visualized paranasal sinuses. No mastoid or middle ear effusion. The orbits are normal. ASPECTS ([place] Stroke Program Early CT Score) - Ganglionic level infarction (caudate, lentiform nuclei, internal capsule, insula, M1-M3 cortex): 7 - Supraganglionic infarction (M4-M6 cortex): 3 Total score (0-10 with 10 being normal): 10 CTA NECK FINDINGS SKELETON: There is no bony spinal canal stenosis. No lytic or blastic lesion. OTHER NECK: Normal pharynx, larynx and major salivary glands. No cervical lymphadenopathy. Unremarkable thyroid gland. UPPER CHEST:  Biapical emphysema AORTIC ARCH: There is mild calcific atherosclerosis of the aortic arch. There is no aneurysm,  dissection or hemodynamically significant stenosis of the visualized ascending aorta and aortic arch. Conventional 3 vessel aortic branching pattern. The visualized proximal subclavian arteries are widely patent. RIGHT CAROTID SYSTEM: --Common carotid artery: Widely patent origin without common carotid artery dissection or aneurysm. --Internal carotid artery: Normal without aneurysm, dissection or stenosis. --External carotid artery: No acute abnormality. LEFT CAROTID SYSTEM: --Common carotid artery: Widely patent origin without common carotid artery dissection or aneurysm. --Internal carotid artery: Normal without aneurysm, dissection or stenosis. --External carotid artery: No acute abnormality. VERTEBRAL ARTERIES: Left dominant configuration. Both origins are normal. No dissection, occlusion or flow-limiting stenosis to the vertebrobasilar confluence. CTA HEAD FINDINGS POSTERIOR CIRCULATION: --Basilar artery: Normal. --Posterior cerebral arteries: Normal. Both originate from the basilar artery. --Superior cerebellar arteries: Normal. --Inferior cerebellar arteries: Normal anterior and posterior inferior cerebellar arteries. ANTERIOR CIRCULATION: --Intracranial internal carotid arteries: Normal. --Anterior cerebral arteries: Normal. Both A1 segments are present. Patent anterior communicating artery. --Middle cerebral arteries: Normal. --Posterior communicating arteries: Absent bilaterally. VENOUS SINUSES: As permitted by contrast timing, patent. ANATOMIC VARIANTS: None DELAYED PHASE: No parenchymal contrast enhancement. Review of the MIP images confirms the above findings. IMPRESSION: 1. No acute intracranial abnormality.  ASPECTS is 10. 2. No emergent large vessel occlusion or hemodynamically significant stenosis of the head and neck. 3.  Aortic atherosclerosis (ICD10-I70.0). These results were communicated to Dr. Amie Portland at 2:55 pm on 05/30/2018 by text page via the [facility] messaging system. Electronically  Signed   By: Ulyses Jarred M.D.   On: 05/30/2018 15:06    Procedures Procedures (including critical care time)  Medications Ordered in ED Medications  iopamidol (ISOVUE-370) 76 % injection 50 mL (50 mLs Intravenous Contrast Given 05/30/18 1435)     Initial Impression / Assessment and Plan / ED Course  I have reviewed the triage vital signs and the nursing notes.  Pertinent labs & imaging results that were available during my care of the patient were reviewed by me and considered in my medical decision making (see chart for details).        67yF with stroke like symptoms. By the time she came back from CT and I evaluated her, her symptoms had resolved. Neuro exam is nonfocal. Negative CT/MRI. Evaluated by neurology. Symptoms and exam inconsistent. Felt not to be TIA. Pt comfortable with outpt FU. Return precautions dicussed.   Final Clinical Impressions(s) / ED Diagnoses   Final diagnoses:  Weakness of lower extremity, unspecified laterality    ED Discharge Orders    None       Virgel Manifold, MD 06/06/18 [phone]

## 2018-06-18 ENCOUNTER — Other Ambulatory Visit: Payer: Self-pay | Admitting: Adult Health

## 2018-06-18 ENCOUNTER — Telehealth (HOSPITAL_COMMUNITY): Payer: Self-pay

## 2018-06-18 NOTE — Telephone Encounter (Signed)
Called pt and left a message to let pt know that the department will be closed the next 2 weeks. Canceled pt apts until then

## 2018-06-21 ENCOUNTER — Other Ambulatory Visit: Payer: Self-pay

## 2018-06-21 ENCOUNTER — Encounter: Payer: Self-pay | Admitting: Family Medicine

## 2018-06-21 ENCOUNTER — Ambulatory Visit (INDEPENDENT_AMBULATORY_CARE_PROVIDER_SITE_OTHER): Payer: Medicare Other | Admitting: Family Medicine

## 2018-06-21 VITALS — BP 118/76 | HR 80 | Temp 97.5°F | Resp 16 | Ht 67.5 in | Wt 173.2 lb

## 2018-06-21 DIAGNOSIS — F3181 Bipolar II disorder: Secondary | ICD-10-CM | POA: Diagnosis not present

## 2018-06-21 MED ORDER — ARIPIPRAZOLE 20 MG PO TABS: 20.0000 mg | ORAL_TABLET | Freq: Every day | ORAL | 0 refills | Status: DC

## 2018-06-21 NOTE — Progress Notes (Signed)
OFFICE VISIT  06/21/2018   CC:  Chief Complaint  Patient presents with  . Follow-up    mood/anxiety     HPI:    Patient is a 68 y.o. Caucasian female who presents for 2 week f/u bipolar disorder II. Her illness is characterized by waxing and waning mixed episodes. Last visit I added abilify 10mg  and continued current dosing of citalopram, lamictal, and alprazolam.  Interim hx:  She has improved and is tolerating the abilify. Says she is less manic-feeling, less energy, less racing thoughts, depressed mood improved. No SI or HI.  No sleeping in daytime.  Sleeps hs if she takes [place]. Taking alpraz tid and this really helps. Taking citalopram 40 mg qd.    Past Medical History:  Diagnosis Date  . Alcoholism in remission [facility])    states last alcohol 11 yrs ago  . Anxiety   . Asymptomatic gallstones   . Bipolar 1 disorder ([place])    Admission for mania x 2 (most recent 11/2017)  . Cervical spondylosis without myelopathy 12/19/2013   MRI 02/2014: multilevel DDD/spondylosis, not much change compared to prior MRI.  Pt not in favor of invasive therapy for her neck as of 04/2014.  Marland Kitchen COPD (chronic obstructive pulmonary disease) (HCC)    Moderate: anoro started 04/2017 by pulm, pt symptomatically improved and PFTs stable at f/u 06/2017--pulm f/u planned 1 yr.  . Dilutional hyponatremia   . Endometritis 05/2017   possible; empiric tx with Flagyl by GYN.  . Environmental allergies   . Fibromyalgia   . GERD (gastroesophageal reflux disease)   . Herpes zoster 08/07/2017   L scapular region  . Hiatal hernia   . History of adenomatous polyp of colon 04/2008; 08/2014   No high grade dysplasia: recall 5 yrs ([name], [name])  . History of basal cell carcinoma excision    NOSE  . History of Helicobacter pylori infection 04/2008   +gastric biopsy (gastritis but no metaplasia, dysplasia, or malignancy identified)  . History of TIA (transient ischemic attack)    secondary to HELLP  syndrome 1994 post c/s--  residual memory loss  . Hyperlipidemia    no meds in > a decade per pt.  Recommended statin 06/21/16.  Marland Kitchen Hypertension   . Internal hemorrhoids   . Ischemic cardiomyopathy 05/2018   Echo EF 45-50% in context of NSTEMI from CA spasm.  . 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 04/2017.  . Memory loss    Abnl MRI brain and CT brain c/w chronic microvascular ischemia.  Repeat MRI brain 02/2014 stable (Dr. Mayra Reel with no plan to f/u with neuro as of 04/2014)  . NSTEMI (non-ST elevated myocardial infarction) ([place]) 05/2018   Cath showed distal LAD spasm; mod RCA dz.   Echo with some severe hypokinesis and EF 45-50%. Pt started on indur 30mg  qd 05/2018.  . Osteoporosis 05/2010   2012 'penia; 06/2015 'porosis:  prolia.  repeat DEXA 2 yrs.  As of 12/2016 pt going to discuss w/GYN whether or not to continue prolia.  DEXA 09/2017 T-score -2.2 femur neck.  . Pelvic floor dysfunction    [facility] ([name])  . Postmenopausal vaginal bleeding 05/2017   x 1 small episode; GYN attempted endo bx but unable to penetrate cervix due to severe cerv stenosis (due to postmenopausal state + hx of LEEP).  Endo  u/s showed thin endo lining.  Per GYN---no evidence of endomet pathology on w/u---obs as of 07/2017.  Marland Kitchen Prediabetes 06/2016   Fasting gluc 105; HbA1c at that time was 6.0%.  A1c 6.2% 12/2017.  Marland Kitchen Recurrent kidney stones 08/2013   Left ureteral calculus: Perc nephr--cystoscopy w/ureteroscopy + laser for stone removal.  Residual asymptomatic left renal nephrolithiasis <49mm present post-procedure.  Right sided hydronephrosis-persistent (on u/s)--urol ordered CT to further eval 08/2016: 1.6 cm nonobstructing stone lower pole left kidney, no hydro, plan for PCN extraction ([facility])  . Shortness of breath   .  Sprain of neck 12/19/2013  . TMJ (temporomandibular joint disorder)    USES  MOUTH GUARD  . Toe fracture, left 12/2017   2nd toe prox phalanx (immobilization, [name])  . Vitamin D deficiency 05/2011  . Weak urinary stream 07/2016   with elevated PVR and mild left hydronephrosis secondary to this ([facility] started flomax 0.4mg  qd for this).    Past Surgical History:  Procedure Laterality Date  . ANTERIOR CERVICAL DECOMP/DISCECTOMY FUSION  06-30-2009   C3 -- C5  AND EXPLORATION OF FUSION C5-7 W/  PLATE REMOVAL  . BUNIONECTOMY Left   . CERVICAL FUSION  2002   anterior C5 -- C7  . CERVICAL SPINE SURGERY  08-27-1999     C6 -- T1  LAMINECTOMY/  DISKECTOMY  . CESAREAN SECTION  1994  . COLONOSCOPY W/ POLYPECTOMY  04/2008;08/2014   2016 tubular adenoma x 1; +diverticulosis and int/ext hemorrhoids.  Recall 5 yrs  . CYSTOSCOPY WITH URETEROSCOPY AND STENT PLACEMENT Left 08/28/2013   Procedure: CYSTOSCOPY, RGP,  WITH URETEROSCOPY AND STENT REMOVAL;  Surgeon: [name], MD;  Location: [facility];  Service: Urology;  Laterality: Left;  . DEXA  09/2017   T-score -2.2 femur neck: improved compared to 06/2015.  Marland Kitchen ESOPHAGOGASTRODUODENOSCOPY  2010; 08/2014   +Candidal esophagitis; Mild chronic gastritis w/intestinal metaplasia--NEG H pylori, neg for eosinophilic esoph  . HOLMIUM LASER APPLICATION Left 9/89/2119   Procedure: HOLMIUM LASER APPLICATION;  Surgeon: [name], MD;  Location: [facility];  Service: Urology;  Laterality: Left;  . LEFT HEART CATH AND CORONARY ANGIOGRAPHY N/A 05/15/2018   Evidence for spasm in distal LAD.  Mod RCA atherosclerosis, o/w no CAD.  Procedure: LEFT HEART CATH AND CORONARY ANGIOGRAPHY;  Surgeon: Leonie Man, MD;  Location: [facility];  Service: Cardiovascular;  Laterality: N/A;  . NEGATIVE SLEEP STUDY  04-01-2007  . NEPHROLITHOTOMY Left 08/05/2013   Procedure: NEPHROLITHOTOMY PERCUTANEOUS;  Surgeon: [name], MD;  Location: [facility];  Service: Urology;  Laterality: Left;  . TONSILLECTOMY AND ADENOIDECTOMY  1975  . TOTAL KNEE ARTHROPLASTY Right 2003  . TRANSTHORACIC ECHOCARDIOGRAM  05/2018   (after MI secondary to arterial spasm): EF 45-50%; severe hypokinesis of apical anterolateral, inferolateral , anterior, and inferior LV myocardium.  Marland Kitchen ULTRASOUND EXAM,PELVIC COMPLETE ([facility] HX)  06/25/15   NORMAL (done by GYN)    Outpatient Medications Prior to Visit  Medication Sig Dispense Refill  . acetaminophen (TYLENOL) 325 MG tablet Take 650 mg by mouth every 6 (six) hours as needed for mild pain or headache.    . ALPRAZolam (XANAX) 1 MG tablet Take 1 tablet (1 mg total) by mouth 3 (three) times daily. 270 tablet 1  . aspirin EC 81 MG EC tablet Take 1 tablet (81 mg total) by mouth daily. 90 tablet 3  . atorvastatin (LIPITOR) 80 MG tablet Take 1 tablet (80 mg  total) by mouth daily at 6 PM. 90 tablet 3  . carvedilol (COREG) 25 MG tablet Take 12.5 mg by mouth 2 (two) times daily with a meal.    . citalopram (CELEXA) 40 MG tablet Take 40 mg by mouth daily. 1/2 tab po qd    . cyclobenzaprine (FLEXERIL) 10 MG tablet Take 10 mg by mouth 2 (two) times daily.    Marland Kitchen doxycycline (VIBRAMYCIN) 50 MG capsule Take 50 mg by mouth 2 (two) times daily.   3  . famotidine (PEPCID) 40 MG tablet Take 1 tablet (40 mg total) by mouth daily. 30 tablet 11  . fluticasone (FLONASE) 50 MCG/ACT nasal spray Place 2 sprays into both nostrils daily.    . isosorbide mononitrate (IMDUR) 30 MG 24 hr tablet Take 1 tablet (30 mg total) by mouth daily. 90 tablet 3  . lamoTRIgine (LAMICTAL) 100 MG tablet Take 3 tablets (300 mg total) by mouth at bedtime. 90 tablet 11  . montelukast (SINGULAIR) 10 MG tablet TAKE 1 TABLET (10 MG TOTAL) BY MOUTH EVERY EVENING. 90 tablet 4  . Multiple Vitamin (MULTI-VITAMINS) TABS Take 1 tablet by mouth daily.     . nitroGLYCERIN (NITROSTAT) 0.4 MG SL tablet Place 1 tablet (0.4 mg total) under the tongue every  5 (five) minutes x 3 doses as needed for chest pain. 25 tablet 3  . polyethylene glycol powder (GLYCOLAX/MIRALAX) powder TAKE 17 G BY MOUTH DAILY. 3162 g 0  . Propylene Glycol (SYSTANE COMPLETE) 0.6 % SOLN Apply 1 drop to eye 2 (two) times daily.    . tamsulosin (FLOMAX) 0.4 MG CAPS capsule Take 0.4 mg by mouth daily.    Marland Kitchen triamcinolone cream (KENALOG) 0.1 % Apply 1 application topically daily.    Marland Kitchen umeclidinium-vilanterol (ANORO ELLIPTA) 62.5-25 MCG/INH AEPB Inhale 1 puff into the lungs daily.    Marland Kitchen zolpidem (AMBIEN) 10 MG tablet Take 1 tablet (10 mg total) by mouth at bedtime. 90 tablet 1  . ARIPiprazole (ABILIFY) 10 MG tablet Take 1 tablet (10 mg total) by mouth daily. 30 tablet 0  . PROAIR HFA 108 (90 Base) MCG/ACT inhaler INHALE 2 PUFFS INTO THE LUNGS EVERY 4 (FOUR) HOURS AS NEEDED FOR WHEEZING OR SHORTNESS OF BREATH. (Patient not taking: Reported on 06/21/2018) 8.5 Inhaler 0   No facility-administered medications prior to visit.     Allergies  Allergen Reactions  . Ceftin [Cefuroxime Axetil] Anaphylaxis    Swelling of eyes and tongue  . Ciprofloxacin Anaphylaxis    Difficulty breathing and generalized swelling  . Fosamax [Alendronate Sodium] Diarrhea  . Morphine And Related Other (See Comments)    Hallucinations/ disoriented - pt stated, "Only Morphine" - 03/23/17  . Prednisone Other (See Comments)    Hyperactivity  . Seroquel [Quetiapine] Other (See Comments)    oversedation    ROS As per HPI  PE: Blood pressure 118/76, pulse 80, temperature (!) 97.5 F (36.4 C), temperature source Oral, resp. rate 16, height 5' 7.5" (1.715 m), weight 173 lb 3.2 oz (78.6 kg), SpO2 95 %. Body mass index is 26.73 kg/m.  Gen: Alert, well appearing.  Patient is oriented to person, place, time, and situation. AFFECT: pleasant, lucid thought and speech. Talks calm/normal.  Keeps on topic.  Does not ramble. CV: RRR, no m/r/g.   LUNGS: CTA bilat, nonlabored resps, good aeration in all lung  fields. No tremor.   Patellar DTRs 4+ bilat, achilles DTRs 1+ bilat w/out clonus.  LABS:    Chemistry  Component Value Date/Time   NA 138 05/30/2018 1410   NA 139 05/23/2018 1152   K 3.9 05/30/2018 1410   CL 105 05/30/2018 1410   CO2 25 05/30/2018 1410   BUN 8 05/30/2018 1410   BUN 11 05/23/2018 1152   CREATININE 0.90 05/30/2018 1411   CREATININE 1.27 (H) 05/16/2016 1525      Component Value Date/Time   CALCIUM 9.7 05/30/2018 1410   ALKPHOS 53 05/30/2018 1410   AST 19 05/30/2018 1410   ALT 24 05/30/2018 1410   BILITOT 1.0 05/30/2018 1410     Lab Results  Component Value Date   WBC 7.7 05/30/2018   HGB 10.6 (L) 05/30/2018   HCT 32.9 (L) 05/30/2018   MCV 95.1 05/30/2018   PLT 255 05/30/2018   Lab Results  Component Value Date   TSH 1.766 11/16/2017   Lab Results  Component Value Date   CHOL 158 05/15/2018   HDL 62 05/15/2018   LDLCALC 79 05/15/2018   LDLDIRECT 144.1 05/29/2013   TRIG 85 05/15/2018   CHOLHDL 2.5 05/15/2018    IMPRESSION AND PLAN:  1) Bipolar II: improved some on abilify 10 mg qd. Will increase to 15mg  qd x 2 wks, then increase to 20mg  qd. At the same time, I'll cut her citalopram down from 40mg  qd to 20mg  qd. Continue all else as-is.  2) Coronary spasm, NSTEMI, impaired LV function: her cardiac rehab has been postponed indefinitely until coronavirus scare resolves.  An After Visit Summary was printed and given to the patient.  FOLLOW UP: Return in about 4 weeks (around 07/19/2018) for f/u mood/anx.  Signed:  Crissie Sickles, MD           06/21/2018

## 2018-06-21 NOTE — Patient Instructions (Signed)
TAke HALF of your citalopram 40mg  tab once a day.  TAke 1 and 1/2 of your abilify (aripiprazole) 10mg  tabs until you finish these tabs.  Then fill prescription for abilify 20mg  tab and take ONE per day.

## 2018-06-28 ENCOUNTER — Ambulatory Visit (HOSPITAL_COMMUNITY): Payer: Self-pay

## 2018-06-28 ENCOUNTER — Telehealth: Payer: Self-pay | Admitting: Family Medicine

## 2018-06-28 NOTE — Telephone Encounter (Signed)
Patient called and asked questions about COVID-19 testing for her daughter who doesn't live with her, but lives in [place]. She asked where would she need to go get testing, I advised she will need to contact her PCP and if she doesn't have one, call the health department in [facility], number provided.

## 2018-07-04 ENCOUNTER — Ambulatory Visit (HOSPITAL_COMMUNITY): Payer: Self-pay

## 2018-07-04 ENCOUNTER — Telehealth: Payer: Self-pay | Admitting: Family Medicine

## 2018-07-04 NOTE — Telephone Encounter (Signed)
Noted  

## 2018-07-04 NOTE — Telephone Encounter (Signed)
I contacted patient, patient's BP is to be managed by cardiology.  I gave her number for Kathyrn Drown, FNP at Cardiology.   Patient states her BP is normal today.    She did state that she stopped abilify last night.  She states her BP has gone "haywire" since starting abilify.

## 2018-07-04 NOTE — Telephone Encounter (Signed)
Patient contacted [facility] 07/03/2018 @ 11:06pm stating that her blood pressure is 160/100.  Earlier that day it was 140/89, 150/85, 166/95.   Patient reports she has a headache but no other symptoms are listed.    RN disposition was to see PCP in 3 days.    Please advise.

## 2018-07-05 ENCOUNTER — Encounter: Payer: Self-pay | Admitting: Family Medicine

## 2018-07-06 ENCOUNTER — Telehealth: Payer: Self-pay | Admitting: Cardiology

## 2018-07-06 ENCOUNTER — Ambulatory Visit (HOSPITAL_COMMUNITY): Payer: Self-pay

## 2018-07-06 NOTE — Telephone Encounter (Signed)
New Message   Pt c/o BP issue: STAT if pt c/o blurred vision, one-sided weakness or slurred speech  1. What are your last 5 BP readings? Today- 160/95       Yesterday Morning- 169/96    Evening 159/100   2. Are you having any other symptoms (ex. Dizziness, headache, blurred vision, passed out)? Headache   3. What is your BP issue? Elevated Bp, she has known for about a month

## 2018-07-09 ENCOUNTER — Telehealth: Payer: Self-pay

## 2018-07-09 ENCOUNTER — Ambulatory Visit (HOSPITAL_COMMUNITY): Payer: Self-pay

## 2018-07-09 MED ORDER — CITALOPRAM HYDROBROMIDE 20 MG PO TABS: 20.0000 mg | ORAL_TABLET | Freq: Every day | ORAL | 5 refills | Status: DC

## 2018-07-09 NOTE — Telephone Encounter (Signed)
Pls remind her that she is only supposed to be taking 20 mg of citalopram now (since most recent f/u visit) Citalopram 20mg  eRx'd today-thx

## 2018-07-09 NOTE — Telephone Encounter (Signed)
Left message to call back  

## 2018-07-09 NOTE — Telephone Encounter (Signed)
Opened encounter in error  

## 2018-07-09 NOTE — Telephone Encounter (Signed)
RF request for Citalopram 40mg  LOV: 06/21/18 Next ov: 08/02/18 Last written: n/a  Historical med on list. Please advise on refill.

## 2018-07-10 NOTE — Telephone Encounter (Signed)
Pt was advised and voiced understanding.

## 2018-07-10 NOTE — Progress Notes (Signed)
Virtual Visit via Video Note   This visit type was conducted due to national recommendations for restrictions regarding the COVID-19 Pandemic (e.g. social distancing) in an effort to limit this patient's exposure and mitigate transmission in our community.  Due to her co-morbid illnesses, this patient is at least at moderate risk for complications without adequate follow up.  This format is felt to be most appropriate for this patient at this time.  All issues noted in this document were discussed and addressed.  A limited physical exam was performed with this format.  Please refer to the patient's chart for her consent to telehealth for [facility].  Evaluation Performed:  Follow-up visit  This visit type was conducted due to national recommendations for restrictions regarding the COVID-19 Pandemic (e.g. social distancing).  This format is felt to be most appropriate for this patient at this time.  All issues noted in this document were discussed and addressed.  No physical exam was performed (except for noted visual exam findings with Video Visits).  Please refer to the patient's chart (MyChart message for video visits and phone note for telephone visits) for the patient's consent to telehealth for [facility].  Date:  07/11/2018   ID:  Erin Lopez, DOB Mar 05, 1951, MRN [id number]  Patient Location:  Home  Provider location:   Erin Lopez  PCP:  Tammi Sou, MD  Cardiologist:  Fransico Him, MD  Electrophysiologist:  None   Chief Complaint:  HTN, Hyperlipidemia, and chest pain due to coronary artery vasospasm  History of Present Illness:    Erin Lopez is a 68 y.o. female who presents via audio/video conferencing for a telehealth visit today.    She presented to [facility]2020with chest pressure and shortness of breath.  Initial troponin elevated at 0.1. Creatininewasnormal at 0.82,hemoglobin 11.6 and platelet count 249. EKG showed normal sinus rhythm  with T wave inversions in V1 and V2.  She underwent cath on  05/15/2018 which revealed low normal LV function at 45-50% with distal anterior-anterior apical hypokinesis. There was a distal LAD lesion at 60%. Following IC NTG, this reduced to approximately 30% diffuse disease, consistent with vasospasm. There was proximal to mid RCA lesion as well that was 45% stenosed. Recommendations were for long acting nitrates and consideration of amlodipine with aggressive risk factor modification. Troponin trend-0.27>0.14>0.10  She was seen back in the clinic 05/23/2018 with multiple complaints that were there prior to cath including generalized arm and leg pain and has had prior neck surgery and follows with Rheum for lower leg pain.  She has chronic fibromyalgia in the past.  She was continued on Imdur 15mg  daily and carvedilol 12.5mg  BID.    She is here today for followoup. In the last 2 weeks she has had some problems with her BP getting as high as 200/18mmHg.  She took the NTG SL which helped bring it down to 145/59mmHg.  When her BP gets too high she will get pain in her neck but also gets pain in her neck just randomly from prior neck surgery. She is here today for followup and is doing well.  She denies any SOB, DOE, PND, orthopnea, LE edema, dizziness, palpitations or syncope. She did have some CP last night over her left breast and took a SL NTG which resolved it.  She does get a headache from the Imdur which she says is very bad and wonders if she could take something else.    She patient does not have  symptoms concerning for COVID-19 infection (fever, chills, cough, or new shortness of breath).   Prior CV studies:   The following studies were reviewed today:  Cardiac cath 05/2018 and 2D echo  Past Medical History:  Diagnosis Date  . Alcoholism in remission [facility])    states last alcohol 11 yrs ago  . Anxiety   . Asymptomatic gallstones   . Bipolar 1 disorder ([place])    Admission for mania x 2 (most  recent 11/2017)  . Cervical spondylosis without myelopathy 12/19/2013   MRI 02/2014: multilevel DDD/spondylosis, not much change compared to prior MRI.  Pt not in favor of invasive therapy for her neck as of 04/2014.  Marland Kitchen COPD (chronic obstructive pulmonary disease) (HCC)    Moderate: anoro started 04/2017 by pulm, pt symptomatically improved and PFTs stable at f/u 06/2017--pulm f/u planned 1 yr.  . Coronary vasospasm ([place]) 07/11/2018  . Dilutional hyponatremia   . Endometritis 05/2017   possible; empiric tx with Flagyl by GYN.  . Environmental allergies   . Fibromyalgia   . GERD (gastroesophageal reflux disease)   . Herpes zoster 08/07/2017   L scapular region  . Hiatal hernia   . History of adenomatous polyp of colon 04/2008; 08/2014   No high grade dysplasia: recall 5 yrs ([name], [name])  . History of basal cell carcinoma excision    NOSE  . History of Helicobacter pylori infection 04/2008   +gastric biopsy (gastritis but no metaplasia, dysplasia, or malignancy identified)  . History of TIA (transient ischemic attack)    secondary to HELLP syndrome 1994 post c/s--  residual memory loss  . Hyperlipidemia    no meds in > a decade per pt.  Recommended statin 06/21/16.  Marland Kitchen Hypertension   . Internal hemorrhoids   . Ischemic cardiomyopathy 05/2018   Echo EF 45-50% in context of NSTEMI from CA spasm.  . 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 04/2017.  . Memory loss    Abnl MRI brain and CT brain c/w chronic microvascular ischemia.  Repeat MRI brain 02/2014 stable (Dr. Mayra Reel with no plan to f/u with neuro as of 04/2014)  . NSTEMI (non-ST elevated myocardial infarction) ([place]) 05/2018   Cath showed distal LAD spasm; mod RCA dz.   Echo with some severe hypokinesis and EF 45-50%. Pt started on indur 30mg  qd  05/2018.  . Osteoporosis 05/2010   2012 'penia; 06/2015 'porosis:  prolia.  repeat DEXA 2 yrs.  As of 12/2016 pt going to discuss w/GYN whether or not to continue prolia.  DEXA 09/2017 T-score -2.2 femur neck.  . Pelvic floor dysfunction    [facility] ([name])  . Postmenopausal vaginal bleeding 05/2017   x 1 small episode; GYN attempted endo bx but unable to penetrate cervix due to severe cerv stenosis (due to postmenopausal state + hx of LEEP).  Endo u/s showed thin endo lining.  Per GYN---no evidence of endomet pathology on w/u---obs as of 07/2017.  Marland Kitchen Prediabetes 06/2016   Fasting gluc 105; HbA1c at that time was 6.0%.  A1c 6.2% 12/2017.  Marland Kitchen Recurrent kidney stones 08/2013   Left ureteral calculus: Perc nephr--cystoscopy w/ureteroscopy + laser for stone removal.  Residual asymptomatic left renal nephrolithiasis <56mm present post-procedure.  Right sided hydronephrosis-persistent (on u/s)--urol ordered CT to further eval 08/2016: 1.6 cm nonobstructing stone lower pole left kidney, no  hydro, plan for PCN extraction ([facility])  . Shortness of breath   . Sprain of neck 12/19/2013  . TMJ (temporomandibular joint disorder)    USES  MOUTH GUARD  . Toe fracture, left 12/2017   2nd toe prox phalanx (immobilization, [name])  . Vitamin D deficiency 05/2011  . Weak urinary stream 07/2016   with elevated PVR and mild left hydronephrosis secondary to this ([facility] started flomax 0.4mg  qd for this).   Past Surgical History:  Procedure Laterality Date  . ANTERIOR CERVICAL DECOMP/DISCECTOMY FUSION  06-30-2009   C3 -- C5  AND EXPLORATION OF FUSION C5-7 W/  PLATE REMOVAL  . BUNIONECTOMY Left   . CERVICAL FUSION  2002   anterior C5 -- C7  . CERVICAL SPINE SURGERY  08-27-1999     C6 -- T1  LAMINECTOMY/  DISKECTOMY  . CESAREAN SECTION  1994  . COLONOSCOPY W/ POLYPECTOMY  04/2008;08/2014   2016 tubular adenoma x 1; +diverticulosis and int/ext hemorrhoids.  Recall 5 yrs  . CYSTOSCOPY WITH URETEROSCOPY  AND STENT PLACEMENT Left 08/28/2013   Procedure: CYSTOSCOPY, RGP,  WITH URETEROSCOPY AND STENT REMOVAL;  Surgeon: [name], MD;  Location[facility];  Service: Urology;  Laterality: Left;  . DEXA  09/2017   T-score -2.2 femur neck: improved compared to 06/2015.  Marland Kitchen ESOPHAGOGASTRODUODENOSCOPY  2010; 08/2014   +Candidal esophagitis; Mild chronic gastritis w/intestinal metaplasia--NEG H pylori, neg for eosinophilic esoph  . HOLMIUM LASER APPLICATION Left 08/20/8414   Procedure: HOLMIUM LASER APPLICATION;  Surgeon: [name], MD;  Location: [facility];  Service: Urology;  Laterality: Left;  . LEFT HEART CATH AND CORONARY ANGIOGRAPHY N/A 05/15/2018   Evidence for spasm in distal LAD.  Mod RCA atherosclerosis, o/w no CAD.  Procedure: LEFT HEART CATH AND CORONARY ANGIOGRAPHY;  Surgeon: Leonie Man, MD;  Location: [place];  Service: Cardiovascular;  Laterality: N/A;  . NEGATIVE SLEEP STUDY  04-01-2007  . NEPHROLITHOTOMY Left 08/05/2013   Procedure: NEPHROLITHOTOMY PERCUTANEOUS;  Surgeon: [name], MD;  Location: [facility];  Service: Urology;  Laterality: Left;  . TONSILLECTOMY AND ADENOIDECTOMY  1975  . TOTAL KNEE ARTHROPLASTY Right 2003  . TRANSTHORACIC ECHOCARDIOGRAM  05/2018   (after MI secondary to arterial spasm): EF 45-50%; severe hypokinesis of apical anterolateral, inferolateral , anterior, and inferior LV myocardium.  Marland Kitchen ULTRASOUND EXAM,PELVIC COMPLETE ([facility] HX)  06/25/15   NORMAL (done by GYN)     Current Meds  Medication Sig  . acetaminophen (TYLENOL) 325 MG tablet Take 650 mg by mouth every 6 (six) hours as needed for mild pain or headache.  . ALPRAZolam (XANAX) 1 MG tablet Take 1 tablet (1 mg total) by mouth 3 (three) times daily.  . ARIPiprazole (ABILIFY) 20 MG tablet Take 1 tablet (20 mg total) by mouth daily.  Marland Kitchen aspirin EC 81 MG EC tablet Take 1 tablet (81 mg total) by mouth daily.  Marland Kitchen atorvastatin (LIPITOR) 80 MG tablet Take 1  tablet (80 mg total) by mouth daily at 6 PM.  . carvedilol (COREG) 25 MG tablet Take 12.5 mg by mouth 2 (two) times daily with a meal.  . citalopram (CELEXA) 20 MG tablet Take 1 tablet (20 mg total) by mouth daily.  . cyclobenzaprine (FLEXERIL) 10 MG tablet Take 10 mg by mouth 2 (two) times daily.  Marland Kitchen doxycycline (VIBRAMYCIN) 50 MG capsule Take 50 mg by mouth 2 (two) times daily.   . famotidine (PEPCID) 40 MG tablet  Take 1 tablet (40 mg total) by mouth daily.  . fluticasone (FLONASE) 50 MCG/ACT nasal spray Place 2 sprays into both nostrils daily.  . isosorbide mononitrate (IMDUR) 30 MG 24 hr tablet Take 1 tablet (30 mg total) by mouth daily.  Marland Kitchen lamoTRIgine (LAMICTAL) 100 MG tablet Take 3 tablets (300 mg total) by mouth at bedtime.  . montelukast (SINGULAIR) 10 MG tablet TAKE 1 TABLET (10 MG TOTAL) BY MOUTH EVERY EVENING.  . Multiple Vitamin (MULTI-VITAMINS) TABS Take 1 tablet by mouth daily.   . nitroGLYCERIN (NITROSTAT) 0.4 MG SL tablet Place 1 tablet (0.4 mg total) under the tongue every 5 (five) minutes x 3 doses as needed for chest pain.  . polyethylene glycol powder (GLYCOLAX/MIRALAX) powder TAKE 17 G BY MOUTH DAILY.  Marland Kitchen Propylene Glycol (SYSTANE COMPLETE) 0.6 % SOLN Apply 1 drop to eye 2 (two) times daily.  . tamsulosin (FLOMAX) 0.4 MG CAPS capsule Take 0.4 mg by mouth daily.  Marland Kitchen triamcinolone cream (KENALOG) 0.1 % Apply 1 application topically daily.  Marland Kitchen umeclidinium-vilanterol (ANORO ELLIPTA) 62.5-25 MCG/INH AEPB Inhale 1 puff into the lungs daily.  Marland Kitchen zolpidem (AMBIEN) 10 MG tablet Take 1 tablet (10 mg total) by mouth at bedtime.     Allergies:   Ceftin [cefuroxime axetil]; Ciprofloxacin; Fosamax [alendronate sodium]; Morphine and related; Prednisone; and Seroquel [quetiapine]   Social History   Tobacco Use  . Smoking status: Former Smoker    Packs/day: 1.00    Years: 33.00    Pack years: 33.00    Types: Cigarettes    Last attempt to quit: 08/22/1993    Years since quitting: 24.9   . Smokeless tobacco: Never Used  Substance Use Topics  . Alcohol use: No    Alcohol/week: 0.0 standard drinks    Comment: ALCOHOLIC IN REMISSION SINCE  2004  . Drug use: No     Family Hx: The patient's family history includes Alcohol abuse in her mother; Arthritis in her mother; Breast cancer in her mother; Colon polyps in her brother and sister; Diabetes in her father and mother; Esophageal cancer in her sister; Heart disease in her brother and father; Hyperlipidemia in her father and mother; Hypertension in her father and mother; Irritable bowel syndrome in her sister; Parkinsonism in her mother; Prostate cancer in her father.  ROS:   Please see the history of present illness.     All other systems reviewed and are negative.   Labs/Other Tests and Data Reviewed:    Recent Labs: 11/16/2017: TSH 1.766 05/14/2018: Magnesium 1.9 05/30/2018: ALT 24; BUN 8; Creatinine, Ser 0.90; Hemoglobin 10.6; Platelets 255; Potassium 3.9; Sodium 138   Recent Lipid Panel Lab Results  Component Value Date/Time   CHOL 158 05/15/2018 03:07 AM   TRIG 85 05/15/2018 03:07 AM   HDL 62 05/15/2018 03:07 AM   CHOLHDL 2.5 05/15/2018 03:07 AM   LDLCALC 79 05/15/2018 03:07 AM   LDLDIRECT 144.1 05/29/2013 11:56 AM    Wt Readings from Last 3 Encounters:  07/11/18 170 lb (77.1 kg)  06/21/18 173 lb 3.2 oz (78.6 kg)  06/06/18 171 lb 6.4 oz (77.7 kg)     Objective:    Vital Signs:  BP (!) 153/85   Pulse 99   Ht 5' 7.5" (1.715 m)   Wt 170 lb (77.1 kg) Comment: pt stated weight  BMI 26.23 kg/m    Well nourished, well developed female in no acute distress. Well appearing, alert and conversant, regular work of breathing,  good skin color  Eyes- anicteric mouth- oral mucosa is pink  neuro- grossly intact skin- no apparent rash or lesions or cyanosis   ASSESSMENT & PLAN:    1.  Chest pain/nonobstructive CAD with coronary vasospasm - recent cath for CP and elevated trop showed coronary artery vasospasm  with distal LAD lesion at 60%. Following IC NTG, this reduced to approximately 30% diffuse disease, consistent with vasospasm. There was proximal to mid RCA lesion as well that was 45% stenosed.   She is intolerant to nitrates due to headache so I will change her to Amlodipine 5mg  daily which should also help with her BP.  She will continue on ASA 81mg  daily, Carvedilol 12.5mg  BID, statin.  2.  HTN - she checks her BP at home and has been controlled.  She will continue on Carvedilol 12.5mg  BID.  I am going to add amlodipine 5mg  daily.  I have asked her to check her Bp daily for a week and call with results in 1 week.  SHe says that she has been told she stops breathing in her sleep when she was in the hospital.  I will get a sleep study to rule out OSA which could be driving her BP up as well.    3.  Hyperlipidemia - her LDL goal is < 70.  She will continue on high dose statin with Lipitor 80mg  daily.  I will repeat an FLP and ALT in June.     I will have her followup with me in 4 weeks.      COVID-19 Education: The signs and symptoms of COVID-19 were discussed with the patient and how to seek care for testing (follow up with PCP or arrange E-visit).  The importance of social distancing was discussed today.  Patient Risk:   After full review of this patient's clinical status, I feel that they are at least moderate risk at this time.  Time:   Today, I have spent 25 minutes with the patient reviewing chart and discussing medical problems including chest pain with coronary vasospsam, HTN and hyperlipidmeia and reviewing symptoms of COVID 19 and the ways to protect against contracting the virus with telehealth technology.      Medication Adjustments/Labs and Tests Ordered: Current medicines are reviewed at length with the patient today.  Concerns regarding medicines are outlined above.  Tests Ordered: No orders of the defined types were placed in this encounter.  Medication Changes: No orders of  the defined types were placed in this encounter.   Disposition:  Follow up in 4 weeks  Signed, Fransico Him, MD  07/11/2018 11:22 AM    [place]

## 2018-07-11 ENCOUNTER — Telehealth (INDEPENDENT_AMBULATORY_CARE_PROVIDER_SITE_OTHER): Payer: Medicare Other | Admitting: Cardiology

## 2018-07-11 ENCOUNTER — Encounter: Payer: Self-pay | Admitting: Cardiology

## 2018-07-11 ENCOUNTER — Telehealth: Payer: Self-pay

## 2018-07-11 ENCOUNTER — Ambulatory Visit (HOSPITAL_COMMUNITY): Payer: Self-pay

## 2018-07-11 ENCOUNTER — Other Ambulatory Visit: Payer: Self-pay

## 2018-07-11 VITALS — BP 153/85 | HR 99 | Ht 67.5 in | Wt 170.0 lb

## 2018-07-11 DIAGNOSIS — I201 Angina pectoris with documented spasm: Secondary | ICD-10-CM

## 2018-07-11 DIAGNOSIS — R0681 Apnea, not elsewhere classified: Secondary | ICD-10-CM

## 2018-07-11 DIAGNOSIS — I208 Other forms of angina pectoris: Secondary | ICD-10-CM | POA: Diagnosis not present

## 2018-07-11 DIAGNOSIS — R079 Chest pain, unspecified: Secondary | ICD-10-CM

## 2018-07-11 DIAGNOSIS — E7800 Pure hypercholesterolemia, unspecified: Secondary | ICD-10-CM

## 2018-07-11 DIAGNOSIS — I2583 Coronary atherosclerosis due to lipid rich plaque: Secondary | ICD-10-CM

## 2018-07-11 DIAGNOSIS — I10 Essential (primary) hypertension: Secondary | ICD-10-CM | POA: Diagnosis not present

## 2018-07-11 DIAGNOSIS — I2510 Atherosclerotic heart disease of native coronary artery without angina pectoris: Secondary | ICD-10-CM

## 2018-07-11 HISTORY — DX: Chest pain, unspecified: R07.9

## 2018-07-11 HISTORY — DX: Angina pectoris with documented spasm: I20.1

## 2018-07-11 MED ORDER — CARVEDILOL 25 MG PO TABS: 12.5000 mg | ORAL_TABLET | Freq: Two times a day (BID) | ORAL | 3 refills | Status: DC

## 2018-07-11 MED ORDER — AMLODIPINE BESYLATE 5 MG PO TABS: 5.0000 mg | ORAL_TABLET | Freq: Every day | ORAL | 3 refills | Status: DC

## 2018-07-11 NOTE — Patient Instructions (Addendum)
Medication Instructions:  Stop: Imdur Start: Amlodipine 5 mg, daily, by mouth  If you need a refill on your cardiac medications before your next appointment, please call your pharmacy.   Lab work: Future: Fasting labs, Lipid and Liver in June  If you have labs (blood work) drawn today and your tests are completely normal, you will receive your results only by: Marland Kitchen MyChart Message (if you have MyChart) OR . A paper copy in the mail If you have any lab test that is abnormal or we need to change your treatment, we will call you to review the results.  Testing/Procedures: Your physician has recommended that you have a sleep study. This test records several body functions during sleep, including: brain activity, eye movement, oxygen and carbon dioxide blood levels, heart rate and rhythm, breathing rate and rhythm, the flow of air through your mouth and nose, snoring, body muscle movements, and chest and belly movement.  Follow-Up: Your physician recommends that you schedule a follow-up appointment in: 4 weeks with Dr. Radford Pax, I will call and schedule this closer to the appointment time.   Check Blood pressure for  Week and call with the results.

## 2018-07-11 NOTE — Telephone Encounter (Signed)
Subject: RE: Visit Follow-Up Question           Call in Carvedilol 12.5mg  BID  [place]

## 2018-07-11 NOTE — Telephone Encounter (Signed)
TELEPHONE CALL NOTE  DEANDREA VANPELT has been deemed a candidate for a follow-up tele-health visit to limit community exposure during the Covid-19 pandemic. I spoke with the patient via phone to ensure availability of phone/video source, confirm preferred email & phone number, and discuss instructions and expectations.  I reminded ALORIA LOOPER to be prepared with any vital sign and/or heart rhythm information that could potentially be obtained via home monitoring, at the time of her visit. I reminded HARTLYN REIGEL to expect a phone call at the time of her visit if her visit.  Did the patient verbally acknowledge consent to treatment? [place], RN 07/11/2018 10:30 AM  CONSENT FOR TELE-HEALTH VISIT - PLEASE REVIEW  I hereby voluntarily request, consent and authorize [facility] and its employed or contracted physicians, physician assistants, nurse practitioners or other licensed health care professionals (the Practitioner), to provide me with telemedicine health care services (the "Services") as deemed necessary by the treating Practitioner. I acknowledge and consent to receive the Services by the Practitioner via telemedicine. I understand that the telemedicine visit will involve communicating with the Practitioner through live audiovisual communication technology and the disclosure of certain medical information by electronic transmission. I acknowledge that I have been given the opportunity to request an in-person assessment or other available alternative prior to the telemedicine visit and am voluntarily participating in the telemedicine visit.  I understand that I have the right to withhold or withdraw my consent to the use of telemedicine in the course of my care at any time, without affecting my right to future care or treatment, and that the Practitioner or I may terminate the telemedicine visit at any time. I understand that I have the right to inspect all  information obtained and/or recorded in the course of the telemedicine visit and may receive copies of available information for a reasonable fee.  I understand that some of the potential risks of receiving the Services via telemedicine include:  Marland Kitchen Delay or interruption in medical evaluation due to technological equipment failure or disruption; . Information transmitted may not be sufficient (e.g. poor resolution of images) to allow for appropriate medical decision making by the Practitioner; and/or  . In rare instances, security protocols could fail, causing a breach of personal health information.  Furthermore, I acknowledge that it is my responsibility to provide information about my medical history, conditions and care that is complete and accurate to the best of my ability. I acknowledge that Practitioner's advice, recommendations, and/or decision may be based on factors not within their control, such as incomplete or inaccurate data provided by me or distortions of diagnostic images or specimens that may result from electronic transmissions. I understand that the practice of medicine is not an exact science and that Practitioner makes no warranties or guarantees regarding treatment outcomes. I acknowledge that I will receive a copy of this consent concurrently upon execution via email to the email address I last provided but may also request a printed copy by calling the office of [place].    I understand that my insurance will be billed for this visit.   I have read or had this consent read to me. . I understand the contents of this consent, which adequately explains the benefits and risks of the Services being provided via telemedicine.  . I have been provided ample opportunity to ask questions regarding this consent and the Services and have had my questions answered to my satisfaction. Marland Kitchen  I give my informed consent for the services to be provided through the use of telemedicine in my  medical care  By participating in this telemedicine visit I agree to the above.

## 2018-07-11 NOTE — Telephone Encounter (Signed)
Patient scheduled for video visit with Dr. Radford Pax at 11 am on 07/11/18.

## 2018-07-12 ENCOUNTER — Telehealth: Payer: Self-pay | Admitting: *Deleted

## 2018-07-12 NOTE — Telephone Encounter (Signed)
-----   Message from Sarina Ill, RN sent at 07/11/2018 11:50 AM EDT ----- Regarding: Sleep Study Sleep study, in late June Thanks, [place]

## 2018-07-13 ENCOUNTER — Ambulatory Visit (HOSPITAL_COMMUNITY): Payer: Self-pay

## 2018-07-15 ENCOUNTER — Encounter: Payer: Self-pay | Admitting: Family Medicine

## 2018-07-15 ENCOUNTER — Other Ambulatory Visit: Payer: Self-pay | Admitting: Family Medicine

## 2018-07-16 ENCOUNTER — Ambulatory Visit (HOSPITAL_COMMUNITY): Payer: Self-pay

## 2018-07-18 ENCOUNTER — Ambulatory Visit (HOSPITAL_COMMUNITY): Payer: Self-pay

## 2018-07-20 ENCOUNTER — Ambulatory Visit (HOSPITAL_COMMUNITY): Payer: Self-pay

## 2018-07-21 ENCOUNTER — Other Ambulatory Visit: Payer: Self-pay | Admitting: Pulmonary Disease

## 2018-07-23 ENCOUNTER — Ambulatory Visit (HOSPITAL_COMMUNITY): Payer: Self-pay

## 2018-07-24 ENCOUNTER — Encounter: Payer: Self-pay | Admitting: Family Medicine

## 2018-07-24 ENCOUNTER — Other Ambulatory Visit: Payer: Self-pay

## 2018-07-24 ENCOUNTER — Ambulatory Visit: Payer: Self-pay | Admitting: Family Medicine

## 2018-07-24 ENCOUNTER — Ambulatory Visit (INDEPENDENT_AMBULATORY_CARE_PROVIDER_SITE_OTHER): Payer: Medicare Other | Admitting: Family Medicine

## 2018-07-24 VITALS — BP 126/76 | HR 80

## 2018-07-24 DIAGNOSIS — F3178 Bipolar disorder, in full remission, most recent episode mixed: Secondary | ICD-10-CM | POA: Diagnosis not present

## 2018-07-24 MED ORDER — CITALOPRAM HYDROBROMIDE 20 MG PO TABS: ORAL_TABLET | ORAL | 3 refills | Status: DC

## 2018-07-24 NOTE — Progress Notes (Signed)
Virtual Visit via Video Note  I connected with pt  on 07/24/18 at 11:00 AM EDT by telephone (technology glitch prevented video visit) and verified that I am speaking with the correct person using two identifiers.  Location patient: home Location provider:work or home office Persons participating in the virtual visit: patient, provider  I discussed the limitations of evaluation and management by telemedicine/telephone and the availability of in person appointments. The patient expressed understanding and agreed to proceed.  Telemedicine/telephone visit is a necessity given the COVID-19 restrictions in place at the current time.   HPI: 68 y/o WF being seen for 4 week f/u bipolar affective d/o. Last visit was improved some after adding abilify 10mg  qd.  I chose to increase her slowly up to 20mg  abilify and decreased her citalopram to 20mg  qd at that time.  Kept lamictal and alpraz the same.  Interim hx:  Somewhere along the line she chose to stop abilify b/c she thought it was increasing her bp. She states she also chose to increase her citalopram to 20mg  bid. She states she feels very stable emotionally the last few weeks! Not overly anxious either. She is trying to remain active, has a fit bit now.  Her cardiologist changed her from imdur to amlodipine 5mg  and she is doing well regarding coronary artery spasm on this med.  No new complaints.  ROS: See pertinent positives and negatives per HPI.  Past Medical History:  Diagnosis Date  . Alcoholism in remission [facility])    states last alcohol 11 yrs ago  . Anxiety   . Asymptomatic gallstones   . Bipolar 1 disorder ([place])    Admission for mania x 2 (most recent 11/2017)  . Cervical spondylosis without myelopathy 12/19/2013   MRI 02/2014: multilevel DDD/spondylosis, not much change compared to prior MRI.  Pt not in favor of invasive therapy for her neck as of 04/2014.  Marland Kitchen COPD (chronic obstructive pulmonary disease) (HCC)    Moderate:  anoro started 04/2017 by pulm, pt symptomatically improved and PFTs stable at f/u 06/2017--pulm f/u planned 1 yr.  . Coronary artery spasm ([place]) 07/11/2018   nitrates=HA. Cards changed her to amlodipine 5mg  qd 07/2018.  . Dilutional hyponatremia   . Endometritis 05/2017   possible; empiric tx with Flagyl by GYN.  . Environmental allergies   . Fibromyalgia   . GERD (gastroesophageal reflux disease)   . Herpes zoster 08/07/2017   L scapular region  . Hiatal hernia   . History of adenomatous polyp of colon 04/2008; 08/2014   No high grade dysplasia: recall 5 yrs ([name], [name])  . History of basal cell carcinoma excision    NOSE  . History of Helicobacter pylori infection 04/2008   +gastric biopsy (gastritis but no metaplasia, dysplasia, or malignancy identified)  . History of TIA (transient ischemic attack)    secondary to HELLP syndrome 1994 post c/s--  residual memory loss  . Hyperlipidemia    no meds in > a decade per pt.  Recommended statin 06/21/16.  Marland Kitchen Hypertension   . Internal hemorrhoids   . Ischemic cardiomyopathy 05/2018   Echo EF 45-50% in context of NSTEMI from CA spasm.  . 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 04/2017.  . Memory loss    Abnl MRI brain and CT brain c/w chronic microvascular ischemia.  Repeat MRI brain 02/2014  stable (Dr. Mayra Reel with no plan to f/u with neuro as of 04/2014)  . NSTEMI (non-ST elevated myocardial infarction) ([place]) 05/2018   Cath showed distal LAD spasm; mod RCA dz.   Echo with some severe hypokinesis and EF 45-50%. Pt started on indur 30mg  qd 05/2018.  . Osteoporosis 05/2010   2012 'penia; 06/2015 'porosis:  prolia.  repeat DEXA 2 yrs.  As of 12/2016 pt going to discuss w/GYN whether or not to continue prolia.  DEXA 09/2017 T-score -2.2 femur neck.  . Pelvic  floor dysfunction    [facility] ([name])  . Postmenopausal vaginal bleeding 05/2017   x 1 small episode; GYN attempted endo bx but unable to penetrate cervix due to severe cerv stenosis (due to postmenopausal state + hx of LEEP).  Endo u/s showed thin endo lining.  Per GYN---no evidence of endomet pathology on w/u---obs as of 07/2017.  Marland Kitchen Prediabetes 06/2016   Fasting gluc 105; HbA1c at that time was 6.0%.  A1c 6.2% 12/2017.  Marland Kitchen Recurrent kidney stones 08/2013   Left ureteral calculus: Perc nephr--cystoscopy w/ureteroscopy + laser for stone removal.  Residual asymptomatic left renal nephrolithiasis <42mm present post-procedure.  Right sided hydronephrosis-persistent (on u/s)--urol ordered CT to further eval 08/2016: 1.6 cm nonobstructing stone lower pole left kidney, no hydro, plan for PCN extraction ([facility])  . Shortness of breath   . Sprain of neck 12/19/2013  . TMJ (temporomandibular joint disorder)    USES  MOUTH GUARD  . Toe fracture, left 12/2017   2nd toe prox phalanx (immobilization, [name])  . Vitamin D deficiency 05/2011  . Weak urinary stream 07/2016   with elevated PVR and mild left hydronephrosis secondary to this ([facility] started flomax 0.4mg  qd for this).    Past Surgical History:  Procedure Laterality Date  . ANTERIOR CERVICAL DECOMP/DISCECTOMY FUSION  06-30-2009   C3 -- C5  AND EXPLORATION OF FUSION C5-7 W/  PLATE REMOVAL  . BUNIONECTOMY Left   . CERVICAL FUSION  2002   anterior C5 -- C7  . CERVICAL SPINE SURGERY  08-27-1999     C6 -- T1  LAMINECTOMY/  DISKECTOMY  . CESAREAN SECTION  1994  . COLONOSCOPY W/ POLYPECTOMY  04/2008;08/2014   2016 tubular adenoma x 1; +diverticulosis and int/ext hemorrhoids.  Recall 5 yrs  . CYSTOSCOPY WITH URETEROSCOPY AND STENT PLACEMENT Left 08/28/2013   Procedure: CYSTOSCOPY, RGP,  WITH URETEROSCOPY AND STENT REMOVAL;  Surgeon: [name], MD;  Location: [facility];  Service: Urology;  Laterality: Left;  .  DEXA  09/2017   T-score -2.2 femur neck: improved compared to 06/2015.  Marland Kitchen ESOPHAGOGASTRODUODENOSCOPY  2010; 08/2014   +Candidal esophagitis; Mild chronic gastritis w/intestinal metaplasia--NEG H pylori, neg for eosinophilic esoph  . HOLMIUM LASER APPLICATION Left 7/51/0258   Procedure: HOLMIUM LASER APPLICATION;  Surgeon: [name], MD;  Location: [facility];  Service: Urology;  Laterality: Left;  . LEFT HEART CATH AND CORONARY ANGIOGRAPHY N/A 05/15/2018   Evidence for spasm in distal LAD.  Mod RCA atherosclerosis, o/w no CAD.  Procedure: LEFT HEART CATH AND CORONARY ANGIOGRAPHY;  Surgeon: Leonie Man, MD;  Location: [facility];  Service: Cardiovascular;  Laterality: N/A;  . NEGATIVE SLEEP STUDY  04-01-2007  . NEPHROLITHOTOMY Left 08/05/2013   Procedure: NEPHROLITHOTOMY PERCUTANEOUS;  Surgeon: [name], MD;  Location: [facility];  Service: Urology;  Laterality: Left;  . TONSILLECTOMY AND ADENOIDECTOMY  1975  . TOTAL KNEE ARTHROPLASTY Right  2003  . TRANSTHORACIC ECHOCARDIOGRAM  05/2018   (after MI secondary to arterial spasm): EF 45-50%; severe hypokinesis of apical anterolateral, inferolateral , anterior, and inferior LV myocardium.  Marland Kitchen ULTRASOUND EXAM,PELVIC COMPLETE ([facility] HX)  06/25/15   NORMAL (done by GYN)    Family History  Problem Relation Age of Onset  . Alcohol abuse Mother   . Arthritis Mother   . Hypertension Mother   . Hyperlipidemia Mother   . Diabetes Mother   . Parkinsonism Mother   . Breast cancer Mother   . Prostate cancer Father   . Hypertension Father   . Hyperlipidemia Father   . Diabetes Father   . Heart disease Father   . Esophageal cancer Sister   . Colon polyps Sister   . Colon polyps Brother   . Heart disease Brother        x 2  . Irritable bowel syndrome Sister       Current Outpatient Medications:  .  acetaminophen (TYLENOL) 325 MG tablet, Take 650 mg by mouth every 6 (six) hours as needed for mild pain or headache.,  Disp: , Rfl:  .  ALPRAZolam (XANAX) 1 MG tablet, Take 1 tablet (1 mg total) by mouth 3 (three) times daily., Disp: 270 tablet, Rfl: 1 .  amLODipine (NORVASC) 5 MG tablet, Take 1 tablet (5 mg total) by mouth daily., Disp: 90 tablet, Rfl: 3 .  ANORO ELLIPTA 62.5-25 MCG/INH AEPB, INHALE 1 PUFF BY MOUTH EVERY DAY, Disp: 60 each, Rfl: 5 .  ARIPiprazole (ABILIFY) 20 MG tablet, TAKE 1 TABLET BY MOUTH EVERY DAY, Disp: 30 tablet, Rfl: 0 .  aspirin EC 81 MG EC tablet, Take 1 tablet (81 mg total) by mouth daily., Disp: 90 tablet, Rfl: 3 .  atorvastatin (LIPITOR) 80 MG tablet, Take 1 tablet (80 mg total) by mouth daily at 6 PM., Disp: 90 tablet, Rfl: 3 .  carvedilol (COREG) 25 MG tablet, Take 0.5 tablets (12.5 mg total) by mouth 2 (two) times daily with a meal., Disp: 90 tablet, Rfl: 3 .  citalopram (CELEXA) 20 MG tablet, Take 1 tablet (20 mg total) by mouth daily., Disp: 30 tablet, Rfl: 5 .  cyclobenzaprine (FLEXERIL) 10 MG tablet, Take 10 mg by mouth 2 (two) times daily., Disp: , Rfl:  .  doxycycline (VIBRAMYCIN) 50 MG capsule, Take 50 mg by mouth 2 (two) times daily. , Disp: , Rfl: 3 .  famotidine (PEPCID) 40 MG tablet, Take 1 tablet (40 mg total) by mouth daily., Disp: 30 tablet, Rfl: 11 .  fluticasone (FLONASE) 50 MCG/ACT nasal spray, Place 2 sprays into both nostrils daily., Disp: , Rfl:  .  lamoTRIgine (LAMICTAL) 100 MG tablet, Take 3 tablets (300 mg total) by mouth at bedtime., Disp: 90 tablet, Rfl: 11 .  montelukast (SINGULAIR) 10 MG tablet, TAKE 1 TABLET (10 MG TOTAL) BY MOUTH EVERY EVENING., Disp: 90 tablet, Rfl: 4 .  Multiple Vitamin (MULTI-VITAMINS) TABS, Take 1 tablet by mouth daily. , Disp: , Rfl:  .  nitroGLYCERIN (NITROSTAT) 0.4 MG SL tablet, Place 1 tablet (0.4 mg total) under the tongue every 5 (five) minutes x 3 doses as needed for chest pain., Disp: 25 tablet, Rfl: 3 .  polyethylene glycol powder (GLYCOLAX/MIRALAX) powder, TAKE 17 G BY MOUTH DAILY., Disp: 3162 g, Rfl: 0 .  Propylene Glycol  (SYSTANE COMPLETE) 0.6 % SOLN, Apply 1 drop to eye 2 (two) times daily., Disp: , Rfl:  .  tamsulosin (FLOMAX) 0.4 MG CAPS capsule, Take 0.4 mg  by mouth daily., Disp: , Rfl:  .  triamcinolone cream (KENALOG) 0.1 %, Apply 1 application topically daily., Disp: , Rfl:  .  zolpidem (AMBIEN) 10 MG tablet, Take 1 tablet (10 mg total) by mouth at bedtime., Disp: 90 tablet, Rfl: 1  EXAM:  VITALS per patient if applicable: BP 458/09 (BP Location: Left Arm, Patient Position: Sitting, Cuff Size: Normal)   Pulse 80    GENERAL: alert, oriented, appears well and in no acute distress  HEENT: atraumatic, conjunttiva clear, no obvious abnormalities on inspection of external nose and ears  NECK: normal movements of the head and neck  LUNGS: on inspection no signs of respiratory distress, breathing rate appears normal, no obvious gross SOB, gasping or wheezing  CV: no obvious cyanosis  MS: moves all visible extremities without noticeable abnormality  PSYCH/NEURO: pleasant and cooperative, no obvious depression or anxiety, speech and thought processing grossly intact  LABS: none today    Chemistry      Component Value Date/Time   NA 138 05/30/2018 1410   NA 139 05/23/2018 1152   K 3.9 05/30/2018 1410   CL 105 05/30/2018 1410   CO2 25 05/30/2018 1410   BUN 8 05/30/2018 1410   BUN 11 05/23/2018 1152   CREATININE 0.90 05/30/2018 1411   CREATININE 1.27 (H) 05/16/2016 1525      Component Value Date/Time   CALCIUM 9.7 05/30/2018 1410   ALKPHOS 53 05/30/2018 1410   AST 19 05/30/2018 1410   ALT 24 05/30/2018 1410   BILITOT 1.0 05/30/2018 1410      ASSESSMENT AND PLAN:  Discussed the following assessment and plan:  1) Bipolar d/o: stable/remission at this time. Continue citalopram at 20mg  bid dosing-->new rx sent in. Continue OFF abilify. Continue lamictal 300 mg qd and alpraz 1 mg tid.  2) Coronary artery spasm: doing well since switch from imdur to amlodipine.    I discussed the  assessment and treatment plan with the patient. The patient was provided an opportunity to ask questions and all were answered. The patient agreed with the plan and demonstrated an understanding of the instructions.   The patient was advised to call back or seek an in-person evaluation if the symptoms worsen or if the condition fails to improve as anticipated.  F/u: 3 mo  Signed:  Crissie Sickles, MD           07/24/2018

## 2018-07-25 ENCOUNTER — Ambulatory Visit (HOSPITAL_COMMUNITY): Payer: Self-pay

## 2018-07-27 ENCOUNTER — Ambulatory Visit (HOSPITAL_COMMUNITY): Payer: Self-pay

## 2018-07-30 ENCOUNTER — Ambulatory Visit (HOSPITAL_COMMUNITY): Payer: Self-pay

## 2018-08-01 ENCOUNTER — Telehealth: Payer: Self-pay | Admitting: Cardiology

## 2018-08-01 ENCOUNTER — Telehealth (HOSPITAL_COMMUNITY): Payer: Self-pay | Admitting: *Deleted

## 2018-08-01 ENCOUNTER — Ambulatory Visit (HOSPITAL_COMMUNITY): Payer: Self-pay

## 2018-08-01 NOTE — Telephone Encounter (Signed)
Per pt has been taking Carvedilol 25 mg bid (not 12.5 mg ) and Amlodipine 5 mg  B/P 137/74 HR 85       129/79        88       143/78        96        156/87       83       132/71        85       141/82        93       143/87       93   Will forward to Dr Radford Pax for review .Adonis Housekeeper

## 2018-08-01 NOTE — Telephone Encounter (Signed)
New Message:   Patient calling concering the amount for medication she is taken. Please call patient concering medication.

## 2018-08-01 NOTE — Telephone Encounter (Signed)
Called pt in regards to Cardiac Rehab continued closure due to adherence of national recommendation in group settings for Covid -19.  Pt verbalized understanding.  Pt is doing some exercise but admits not as much as she should be doing.  Will send her exercise educational handouts.  Pt would also like some information on heart healthy eating with stage 2 kidney disease. Will send pt materials in the mail.  Pt appreciated me calling. Cherre Huger, BSN Cardiac and Training and development officer

## 2018-08-02 ENCOUNTER — Encounter: Payer: Self-pay | Admitting: Family Medicine

## 2018-08-02 MED ORDER — CARVEDILOL 25 MG PO TABS: 25.0000 mg | ORAL_TABLET | Freq: Two times a day (BID) | ORAL | 3 refills | Status: DC

## 2018-08-02 NOTE — Telephone Encounter (Signed)
Spoke with the patient, she needs a refill on her medication for Coreg.

## 2018-08-02 NOTE — Telephone Encounter (Signed)
Her BP readings for the most part look good.  Continue carvedilol 25 mg twice daily and amlodipine 5 mg daily

## 2018-08-03 ENCOUNTER — Ambulatory Visit (HOSPITAL_COMMUNITY): Payer: Self-pay

## 2018-08-06 ENCOUNTER — Ambulatory Visit (HOSPITAL_COMMUNITY): Payer: Self-pay

## 2018-08-08 ENCOUNTER — Ambulatory Visit (HOSPITAL_COMMUNITY): Payer: Self-pay

## 2018-08-10 ENCOUNTER — Ambulatory Visit (HOSPITAL_COMMUNITY): Payer: Self-pay

## 2018-08-13 ENCOUNTER — Ambulatory Visit (HOSPITAL_COMMUNITY): Payer: Self-pay

## 2018-08-15 ENCOUNTER — Ambulatory Visit (HOSPITAL_COMMUNITY): Payer: Self-pay

## 2018-08-15 ENCOUNTER — Other Ambulatory Visit: Payer: Self-pay | Admitting: Family Medicine

## 2018-08-15 MED ORDER — FLUTICASONE PROPIONATE 50 MCG/ACT NA SUSP: 2.0000 | Freq: Every day | NASAL | 6 refills | Status: DC

## 2018-08-15 NOTE — Telephone Encounter (Signed)
RF request for patient's Fluticasone. Not prescribed by Dr Anitra Lauth in the past?   Please advise.

## 2018-08-17 ENCOUNTER — Other Ambulatory Visit: Payer: Self-pay | Admitting: Family Medicine

## 2018-08-17 ENCOUNTER — Ambulatory Visit (HOSPITAL_COMMUNITY): Payer: Self-pay

## 2018-08-17 NOTE — Telephone Encounter (Signed)
Pt is requesting 90 day supply instead of 30 day.  RF request for citalopram LOV: 07/24/18 Next ov: advised to f/u 3 months Last written: 07/24/18 (60,2)

## 2018-08-20 ENCOUNTER — Ambulatory Visit (HOSPITAL_COMMUNITY): Payer: Self-pay

## 2018-08-21 NOTE — Telephone Encounter (Signed)
MyChart message read.

## 2018-08-22 ENCOUNTER — Telehealth: Payer: Self-pay | Admitting: Family Medicine

## 2018-08-22 ENCOUNTER — Ambulatory Visit (HOSPITAL_COMMUNITY): Payer: Self-pay

## 2018-08-22 NOTE — Telephone Encounter (Unsigned)
Copied from [place] [phone]. Topic: Quick Communication - Rx Refill/Question >> Aug 22, 2018 12:08 PM Marin Olp L wrote: Medication: zolpidem (AMBIEN) 10 MG tablet   Has the patient contacted their pharmacy? {yes (Agent: If no, request that the patient contact the pharmacy for the refill.) (Agent: If yes, when and what did the pharmacy advise?) contacted office multiple times  Preferred Pharmacy (with phone number or street name): [id number] - [place], [place] - [address] [place] [address] Phone: [phone] Fax: [phone]  Agent: Please be advised that RX refills may take up to 3 business days. We ask that you follow-up with your pharmacy.

## 2018-08-23 ENCOUNTER — Other Ambulatory Visit: Payer: Self-pay

## 2018-08-23 MED ORDER — ZOLPIDEM TARTRATE 10 MG PO TABS: 10.0000 mg | ORAL_TABLET | Freq: Every day | ORAL | 0 refills | Status: DC

## 2018-08-23 NOTE — Telephone Encounter (Signed)
RF request for Zolpidem LOV: 07/24/18 Next ov: f/u 3 months Last written: 02/14/18 (90,0) No CSC or UDS for this medication.   PMP aware printed. Given to provider for review. Please advise, thanks. Medication pending.

## 2018-08-24 ENCOUNTER — Ambulatory Visit (HOSPITAL_COMMUNITY): Payer: Self-pay

## 2018-08-29 ENCOUNTER — Ambulatory Visit (HOSPITAL_COMMUNITY): Payer: Self-pay

## 2018-08-31 ENCOUNTER — Ambulatory Visit (HOSPITAL_COMMUNITY): Payer: Self-pay

## 2018-09-03 ENCOUNTER — Other Ambulatory Visit: Payer: Self-pay

## 2018-09-03 ENCOUNTER — Encounter: Payer: Self-pay | Admitting: Cardiology

## 2018-09-03 ENCOUNTER — Telehealth (INDEPENDENT_AMBULATORY_CARE_PROVIDER_SITE_OTHER): Payer: Medicare Other | Admitting: Cardiology

## 2018-09-03 ENCOUNTER — Ambulatory Visit (HOSPITAL_COMMUNITY): Payer: Self-pay

## 2018-09-03 VITALS — BP 128/73 | HR 78 | Ht 67.5 in | Wt 170.0 lb

## 2018-09-03 DIAGNOSIS — E7800 Pure hypercholesterolemia, unspecified: Secondary | ICD-10-CM

## 2018-09-03 DIAGNOSIS — I201 Angina pectoris with documented spasm: Secondary | ICD-10-CM | POA: Diagnosis not present

## 2018-09-03 DIAGNOSIS — I255 Ischemic cardiomyopathy: Secondary | ICD-10-CM | POA: Diagnosis not present

## 2018-09-03 DIAGNOSIS — I10 Essential (primary) hypertension: Secondary | ICD-10-CM

## 2018-09-03 MED ORDER — LOSARTAN POTASSIUM 25 MG PO TABS: 25.0000 mg | ORAL_TABLET | Freq: Every day | ORAL | 3 refills | Status: DC

## 2018-09-03 NOTE — Progress Notes (Signed)
Virtual Visit via Telephone Note   This visit type was conducted due to national recommendations for restrictions regarding the COVID-19 Pandemic (e.g. social distancing) in an effort to limit this patient's exposure and mitigate transmission in our community.  Due to her co-morbid illnesses, this patient is at least at moderate risk for complications without adequate follow up.  This format is felt to be most appropriate for this patient at this time.  All issues noted in this document were discussed and addressed.  A limited physical exam was performed with this format.  Please refer to the patient's chart for her consent to telehealth for [facility].    Evaluation Performed:  Follow-up visit  This visit type was conducted due to national recommendations for restrictions regarding the COVID-19 Pandemic (e.g. social distancing).  This format is felt to be most appropriate for this patient at this time.  All issues noted in this document were discussed and addressed.  No physical exam was performed (except for noted visual exam findings with Video Visits).  Please refer to the patient's chart (MyChart message for video visits and phone note for telephone visits) for the patient's consent to telehealth for [facility].  Date:  09/03/2018   ID:  Erin Lopez, DOB 30-Mar-1951, MRN [id number]  Patient Location:  Home  Provider location:   [place]  PCP:  Tammi Sou, MD  Cardiologist:  Fransico Him, MD  Electrophysiologist:  None   Chief Complaint:  HTN, lipids, CP, coronary vasospasm  History of Present Illness:    Erin Lopez is a 68 y.o. female who presents via audio/video conferencing for a telehealth visit today.    This is a 68yo female with a history of ASCAD with recent cath for CP and elevated trop showingcoronary artery vasospasm with distal LAD lesion at 60%. Following IC NTG, this reduced to approximately 30% diffuse disease, consistent with vasospasm.  There was proximal to mid RCA lesion as well that was 45% stenosed.   She is intolerant to nitrates due to headache so she was changed to Amlodipine.  She also has HTN, hyperlipidemia, COPD and GERD.  I saw her back in April and she was complaining of headaches with the nitrate so we changed to amlodipine.  Her blood pressure was elevated.  She also had a sleep study ordered because she was told she stopped breathing in her sleep when she was in the hospital.  Her sleep study has not been done yet.  She is here today for followup and is doing well.  She denies any chest pain or pressure, PND, orthopnea, dizziness, palpitations or syncope. She started having LE edema after starting on amlodipine and occasionally her feet feel hot.  She does have DOE secondary to COPD and is starting to get back exercising.  She says that they are not discolored. She is compliant with her meds and is tolerating meds with no SE.    The patient does not have symptoms concerning for COVID-19 infection (fever, chills, cough, or new shortness of breath).    Prior CV studies:   The following studies were reviewed today:  none  Past Medical History:  Diagnosis Date  . Alcoholism in remission [facility])    states last alcohol 11 yrs ago  . Anxiety   . Asymptomatic gallstones   . Bipolar 1 disorder ([place])    Admission for mania x 2 (most recent 11/2017)  . Cervical spondylosis without myelopathy 12/19/2013   MRI 02/2014: multilevel  DDD/spondylosis, not much change compared to prior MRI.  Pt not in favor of invasive therapy for her neck as of 04/2014.  Marland Kitchen COPD (chronic obstructive pulmonary disease) (HCC)    Moderate: anoro started 04/2017 by pulm, pt symptomatically improved and PFTs stable at f/u 06/2017--pulm f/u planned 1 yr.  . Coronary artery spasm ([place]) 07/11/2018   nitrates=HA. Cards changed her to amlodipine 5mg  qd 07/2018.  . Dilutional hyponatremia   . Endometritis 05/2017   possible; empiric tx with Flagyl by GYN.  .  Environmental allergies   . Fibromyalgia   . GERD (gastroesophageal reflux disease)   . Herpes zoster 08/07/2017   L scapular region  . Hiatal hernia   . History of adenomatous polyp of colon 04/2008; 08/2014   No high grade dysplasia: recall 5 yrs ([name], [name])  . History of basal cell carcinoma excision    NOSE  . History of Helicobacter pylori infection 04/2008   +gastric biopsy (gastritis but no metaplasia, dysplasia, or malignancy identified)  . History of TIA (transient ischemic attack)    secondary to HELLP syndrome 1994 post c/s--  residual memory loss  . Hyperlipidemia    no meds in > a decade per pt.  Recommended statin 06/21/16.  Marland Kitchen Hypertension   . Internal hemorrhoids   . Ischemic cardiomyopathy 05/2018   Echo EF 45-50% in context of NSTEMI from CA spasm.  . 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 04/2017.  . Memory loss    Abnl MRI brain and CT brain c/w chronic microvascular ischemia.  Repeat MRI brain 02/2014 stable (Dr. Mayra Reel with no plan to f/u with neuro as of 04/2014)  . NSTEMI (non-ST elevated myocardial infarction) ([place]) 05/2018   Cath showed distal LAD spasm; mod RCA dz.   Echo with some severe hypokinesis and EF 45-50%. Pt started on indur 30mg  qd 05/2018.  . Osteoporosis 05/2010   2012 'penia; 06/2015 'porosis:  prolia.  repeat DEXA 2 yrs.  As of 12/2016 pt going to discuss w/GYN whether or not to continue prolia.  DEXA 09/2017 T-score -2.2 femur neck.  . Pelvic floor dysfunction    [facility] ([name])  . Postmenopausal vaginal bleeding 05/2017   x 1 small episode; GYN attempted endo bx but unable to penetrate cervix due to severe cerv stenosis (due to postmenopausal state + hx of LEEP).  Endo u/s showed thin endo lining.  Per GYN---no evidence of endomet pathology  on w/u---obs as of 07/2017.  Marland Kitchen Prediabetes 06/2016   Fasting gluc 105; HbA1c at that time was 6.0%.  A1c 6.2% 12/2017.  Marland Kitchen Recurrent kidney stones 08/2013   Left ureteral calculus: Perc nephr--cystoscopy w/ureteroscopy + laser for stone removal.  Residual asymptomatic left renal nephrolithiasis <53mm present post-procedure.  Right sided hydronephrosis-persistent (on u/s)--urol ordered CT to further eval 08/2016: 1.6 cm nonobstructing stone lower pole left kidney, no hydro, plan for PCN extraction ([facility])  . Shortness of breath   . Sprain of neck 12/19/2013  . TMJ (temporomandibular joint disorder)    USES  MOUTH GUARD  . Toe fracture, left 12/2017   2nd toe prox phalanx (immobilization, [name])  . Vitamin D deficiency 05/2011  . Weak urinary stream 07/2016   with elevated PVR and mild left hydronephrosis secondary to this ([facility] started flomax 0.4mg  qd for this).   Past  Surgical History:  Procedure Laterality Date  . ANTERIOR CERVICAL DECOMP/DISCECTOMY FUSION  06-30-2009   C3 -- C5  AND EXPLORATION OF FUSION C5-7 W/  PLATE REMOVAL  . BUNIONECTOMY Left   . CERVICAL FUSION  2002   anterior C5 -- C7  . CERVICAL SPINE SURGERY  08-27-1999     C6 -- T1  LAMINECTOMY/  DISKECTOMY  . CESAREAN SECTION  1994  . COLONOSCOPY W/ POLYPECTOMY  04/2008;08/2014   2016 tubular adenoma x 1; +diverticulosis and int/ext hemorrhoids.  Recall 5 yrs  . CYSTOSCOPY WITH URETEROSCOPY AND STENT PLACEMENT Left 08/28/2013   Procedure: CYSTOSCOPY, RGP,  WITH URETEROSCOPY AND STENT REMOVAL;  Surgeon: [name], MD;  Location: [facility];  Service: Urology;  Laterality: Left;  . DEXA  09/2017   T-score -2.2 femur neck: improved compared to 06/2015.  Marland Kitchen ESOPHAGOGASTRODUODENOSCOPY  2010; 08/2014   +Candidal esophagitis; Mild chronic gastritis w/intestinal metaplasia--NEG H pylori, neg for eosinophilic esoph  . HOLMIUM LASER APPLICATION Left 4/78/2956   Procedure: HOLMIUM LASER APPLICATION;   Surgeon: [name], MD;  Location: [facility];  Service: Urology;  Laterality: Left;  . LEFT HEART CATH AND CORONARY ANGIOGRAPHY N/A 05/15/2018   Evidence for spasm in distal LAD.  Mod RCA atherosclerosis, o/w no CAD.  Procedure: LEFT HEART CATH AND CORONARY ANGIOGRAPHY;  Surgeon: Leonie Man, MD;  Location: [facility];  Service: Cardiovascular;  Laterality: N/A;  . NEGATIVE SLEEP STUDY  04-01-2007  . NEPHROLITHOTOMY Left 08/05/2013   Procedure: NEPHROLITHOTOMY PERCUTANEOUS;  Surgeon: [name], MD;  Location: [facility];  Service: Urology;  Laterality: Left;  . TONSILLECTOMY AND ADENOIDECTOMY  1975  . TOTAL KNEE ARTHROPLASTY Right 2003  . TRANSTHORACIC ECHOCARDIOGRAM  05/2018   (after MI secondary to arterial spasm): EF 45-50%; severe hypokinesis of apical anterolateral, inferolateral , anterior, and inferior LV myocardium.  Marland Kitchen ULTRASOUND EXAM,PELVIC COMPLETE ([facility] HX)  06/25/15   NORMAL (done by GYN)     Current Meds  Medication Sig  . acetaminophen (TYLENOL) 325 MG tablet Take 650 mg by mouth every 6 (six) hours as needed for mild pain or headache.  . ALPRAZolam (XANAX) 1 MG tablet Take 1 tablet (1 mg total) by mouth 3 (three) times daily.  Marland Kitchen amLODipine (NORVASC) 5 MG tablet Take 1 tablet (5 mg total) by mouth daily.  Jearl Klinefelter ELLIPTA 62.5-25 MCG/INH AEPB INHALE 1 PUFF BY MOUTH EVERY DAY  . aspirin EC 81 MG EC tablet Take 1 tablet (81 mg total) by mouth daily.  Marland Kitchen atorvastatin (LIPITOR) 80 MG tablet Take 1 tablet (80 mg total) by mouth daily at 6 PM.  . carvedilol (COREG) 25 MG tablet Take 1 tablet (25 mg total) by mouth 2 (two) times daily with a meal.  . citalopram (CELEXA) 20 MG tablet TAKE 1 TABLET BY MOUTH TWICE A DAY  . cyclobenzaprine (FLEXERIL) 10 MG tablet Take 10 mg by mouth 2 (two) times daily.  Marland Kitchen doxycycline (VIBRAMYCIN) 50 MG capsule Take 50 mg by mouth 2 (two) times daily.   . famotidine (PEPCID) 40 MG tablet Take 1 tablet (40 mg total) by mouth  daily.  . fluticasone (FLONASE) 50 MCG/ACT nasal spray Place 2 sprays into both nostrils daily.  Marland Kitchen lamoTRIgine (LAMICTAL) 100 MG tablet Take 3 tablets (300 mg total) by mouth at bedtime.  . montelukast (SINGULAIR) 10 MG tablet TAKE 1 TABLET (10 MG TOTAL) BY MOUTH EVERY EVENING.  . Multiple Vitamin (MULTI-VITAMINS) TABS Take  1 tablet by mouth daily.   . nitroGLYCERIN (NITROSTAT) 0.4 MG SL tablet Place 1 tablet (0.4 mg total) under the tongue every 5 (five) minutes x 3 doses as needed for chest pain.  . polyethylene glycol powder (GLYCOLAX/MIRALAX) powder TAKE 17 G BY MOUTH DAILY.  Marland Kitchen potassium citrate (UROCIT-K) 10 MEQ (1080 MG) SR tablet Take 2 tablets by mouth 2 (two) times a day.  Marland Kitchen Propylene Glycol (SYSTANE COMPLETE) 0.6 % SOLN Apply 1 drop to eye 2 (two) times daily.  . tamsulosin (FLOMAX) 0.4 MG CAPS capsule Take 0.4 mg by mouth daily.  Marland Kitchen triamcinolone cream (KENALOG) 0.1 % Apply 1 application topically daily.  Marland Kitchen zolpidem (AMBIEN) 10 MG tablet Take 1 tablet (10 mg total) by mouth at bedtime.     Allergies:   Ceftin [cefuroxime axetil]; Ciprofloxacin; Fosamax [alendronate sodium]; Morphine and related; Prednisone; and Seroquel [quetiapine]   Social History   Tobacco Use  . Smoking status: Former Smoker    Packs/day: 1.00    Years: 33.00    Pack years: 33.00    Types: Cigarettes    Last attempt to quit: 08/22/1993    Years since quitting: 25.0  . Smokeless tobacco: Never Used  Substance Use Topics  . Alcohol use: No    Alcohol/week: 0.0 standard drinks    Comment: ALCOHOLIC IN REMISSION SINCE  2004  . Drug use: No     Family Hx: The patient's family history includes Alcohol abuse in her mother; Arthritis in her mother; Breast cancer in her mother; Colon polyps in her brother and sister; Diabetes in her father and mother; Esophageal cancer in her sister; Heart disease in her brother and father; Hyperlipidemia in her father and mother; Hypertension in her father and mother;  Irritable bowel syndrome in her sister; Parkinsonism in her mother; Prostate cancer in her father.  ROS:   Please see the history of present illness.     All other systems reviewed and are negative.   Labs/Other Tests and Data Reviewed:    Recent Labs: 11/16/2017: TSH 1.766 05/14/2018: Magnesium 1.9 05/30/2018: ALT 24; BUN 8; Creatinine, Ser 0.90; Hemoglobin 10.6; Platelets 255; Potassium 3.9; Sodium 138   Recent Lipid Panel Lab Results  Component Value Date/Time   CHOL 158 05/15/2018 03:07 AM   TRIG 85 05/15/2018 03:07 AM   HDL 62 05/15/2018 03:07 AM   CHOLHDL 2.5 05/15/2018 03:07 AM   LDLCALC 79 05/15/2018 03:07 AM   LDLDIRECT 144.1 05/29/2013 11:56 AM    Wt Readings from Last 3 Encounters:  09/02/18 170 lb (77.1 kg)  07/11/18 170 lb (77.1 kg)  06/21/18 173 lb 3.2 oz (78.6 kg)     Objective:    Vital Signs:  BP 128/73   Pulse 78   Ht 5' 7.5" (1.715 m)   Wt 170 lb (77.1 kg)   BMI 26.23 kg/m     ASSESSMENT & PLAN:    1.  ASCAD -status post NSTEMI with cath showing coronary artery vasospasm with distal LAD lesion at 60%. Following IC NTG, this reduced to approximately 30% diffuse disease, consistent with vasospasm. There was proximal to mid RCA lesion as well that was 45% stenosed.     She was intolerant to long-acting nitrates due to severe headaches and was switched to amlodipine but she did not tolerate due to LE edema.  She has not had any chest pain.  She will continue aspirin 81 mg daily, carvedilol 25mg  twice daily and statin therapy.  2.  Hypertension -  her blood pressure is controlled today.  Her Bp is good in the am and at night it has been as [id number] systolic.  She is not tolerating the amlodipine due to LE edema. She will continue on Carvedilol 25mg  BID and stop amlodipine.  I will add losartan 25mg  daily and I have asked her to check her BP twice daily for a week and call with the results in a week.  Check BMET at time of lipids.    3.  Hyperlipidemia - her  LDL goal is less than 70.  Her last LDL was 84 on 04/13/2018.  She has an FLP and ALT pending.  4.  Ischemic dilated cardiomyopathy - secondary to NSTEMI with distal LAD spasm.  Her EF was 45 to 50% by echo.  She will continue on beta-blocker therapy.  5.  COVID-19 Education:The signs and symptoms of COVID-19 were discussed with the patient and how to seek care for testing (follow up with PCP or arrange E-visit).  The importance of social distancing was discussed today.  Patient Risk:   After full review of this patient's clinical status, I feel that they are at least moderate risk at this time.  Time:   Today, I have spent 10 minutes directly with the patient on video discussing medical problems including coronary vasospasm, lipids, HTN, DCM.  We also reviewed the symptoms of COVID 19 and the ways to protect against contracting the virus with telehealth technology.  I spent an additional 5 minutes reviewing patient's chart including labs.  Medication Adjustments/Labs and Tests Ordered: Current medicines are reviewed at length with the patient today.  Concerns regarding medicines are outlined above.  Tests Ordered: No orders of the defined types were placed in this encounter.  Medication Changes: No orders of the defined types were placed in this encounter.   Disposition:  Follow up 6 weeks with PA and with me in 6 months  Signed, Fransico Him, MD  09/03/2018 12:56 PM    [place]

## 2018-09-03 NOTE — Patient Instructions (Addendum)
Medication Instructions:  Stop: Amlodipine  Start: Losartan 25 mg, daily, by mouth  If you need a refill on your cardiac medications before your next appointment, please call your pharmacy.   Lab work: Fasting Labs: BMET, Lipid and Liver on 09/25/18  If you have labs (blood work) drawn today and your tests are completely normal, you will receive your results only by: Marland Kitchen MyChart Message (if you have MyChart) OR . A paper copy in the mail If you have any lab test that is abnormal or we need to change your treatment, we will call you to review the results.  Testing/Procedures: None  Follow-Up: At [facility], you and your health needs are our priority.  As part of our continuing mission to provide you with exceptional heart care, we have created designated Provider Care Teams.  These Care Teams include your primary Cardiologist (physician) and Advanced Practice Providers (APPs -  Physician Assistants and Nurse Practitioners) who all work together to provide you with the care you need, when you need it.  Your physician recommends that you schedule a follow-up appointment in: 6 weeks with the PA.    You will need a follow up appointment in 6 months.  Please call our office 2 months in advance to schedule this appointment.  You may see Fransico Him, MD or one of the following Advanced Practice Providers on your designated Care Team:   [place], PA-C Melina Copa, PA-C . Ermalinda Barrios, PA-C  Check blood pressure, twice a day for 1 week and send the results to the office.

## 2018-09-05 ENCOUNTER — Ambulatory Visit (HOSPITAL_COMMUNITY): Payer: Self-pay

## 2018-09-06 ENCOUNTER — Telehealth (HOSPITAL_COMMUNITY): Payer: Self-pay

## 2018-09-06 NOTE — Telephone Encounter (Signed)
Phoned pt to offer virtual cardiac rehab services. Pt states she is exercising on her own and it has been too long since her cardiac event. Also, Pt is not interested in waiting for rehab to open back up either. Will remove pt from [facility].   Carma Lair MS, ACSM CEP 12:05 PM 09/06/2018

## 2018-09-07 ENCOUNTER — Ambulatory Visit (HOSPITAL_COMMUNITY): Payer: Self-pay

## 2018-09-07 ENCOUNTER — Telehealth: Payer: Self-pay | Admitting: *Deleted

## 2018-09-07 NOTE — Telephone Encounter (Signed)
Staff message sent to Gae Bon ok to schedule slep stuydy. Per [facility] web portal no PA is required. Decision IN #[id number].

## 2018-09-07 NOTE — Telephone Encounter (Signed)
-----   Message from Sarina Ill, RN sent at 07/11/2018 11:50 AM EDT ----- Regarding: Sleep Study Sleep study, in late June Thanks, [place]

## 2018-09-10 ENCOUNTER — Ambulatory Visit (HOSPITAL_COMMUNITY): Payer: Self-pay

## 2018-09-12 ENCOUNTER — Ambulatory Visit (HOSPITAL_COMMUNITY): Payer: Self-pay

## 2018-09-14 ENCOUNTER — Ambulatory Visit (HOSPITAL_COMMUNITY): Payer: Self-pay

## 2018-09-17 ENCOUNTER — Ambulatory Visit (HOSPITAL_COMMUNITY): Payer: Self-pay

## 2018-09-17 ENCOUNTER — Telehealth: Payer: Self-pay | Admitting: Cardiology

## 2018-09-17 NOTE — Telephone Encounter (Signed)
Spoke with pt about pains.  She states she gets some pain everyday but the location changes.  Sometimes it's in her back, like running from shoulder to shoulder and then other times it's in her knees and feet.  Tylenol helps occasionally but not every time.  Will route to Dr. Radford Pax for review information.

## 2018-09-17 NOTE — Telephone Encounter (Signed)
BP still too high - please increase Losartan to 50mg  daily and repeat BMET in 1 week.  Please have her check her BP daily for a week and call with results.

## 2018-09-17 NOTE — Telephone Encounter (Signed)
New Message   Patient is calling because she was instructed to call and provider her BP reading:   6/15 145/83 pulse 84 6/14  AM 150/91  PM 149/84 6/13 AM 157/88  PM  154/84 6/12 AM 145/84  PM 150/80 6/11 AM 143/88  PM 149/78 6/10  AM 145/76  PM  135/82   Patient states that she has had few headaches and some discomfort in neck, shoulders and back.

## 2018-09-18 ENCOUNTER — Telehealth: Payer: Self-pay | Admitting: *Deleted

## 2018-09-18 MED ORDER — LOSARTAN POTASSIUM 50 MG PO TABS: 50.0000 mg | ORAL_TABLET | Freq: Every day | ORAL | 3 refills | Status: DC

## 2018-09-18 NOTE — Telephone Encounter (Signed)
-----   Message from Lauralee Evener, [place] sent at 09/07/2018  2:20 PM EDT ----- Regarding: RE: Sleep Study Ok to schedule sleep study. Per [facility] web portal no PA is required. Decision # ID:[id number] ----- Message ----- From: Sarina Ill, RN Sent: 07/11/2018  11:50 AM EDT To: Freada Bergeron, CMA, Cv Div Sleep Studies Subject: Sleep Study                                    Sleep study, in late June Thanks, [place]

## 2018-09-18 NOTE — Telephone Encounter (Signed)
Patient is scheduled for lab study on 10/03/18. Patient understands her sleep study will be done at [facility] sleep lab. Patient understands she will receive a sleep packet in a week or so. Patient understands to call if she does not receive the sleep packet in a timely manner. Patient agrees with treatment and thanked me for call.

## 2018-09-18 NOTE — Telephone Encounter (Signed)
The patient has been notified of the result and verbalized understanding.  All questions (if any) were answered. Sarina Ill, RN 09/18/2018 11:42 AM

## 2018-09-19 ENCOUNTER — Ambulatory Visit (HOSPITAL_COMMUNITY): Payer: Self-pay

## 2018-09-21 ENCOUNTER — Ambulatory Visit (HOSPITAL_COMMUNITY): Payer: Self-pay

## 2018-09-24 ENCOUNTER — Telehealth: Payer: Self-pay | Admitting: *Deleted

## 2018-09-24 ENCOUNTER — Ambulatory Visit (HOSPITAL_COMMUNITY): Payer: Self-pay

## 2018-09-24 NOTE — Telephone Encounter (Signed)
    COVID-19 Pre-Screening Questions:  . In the past 7 to 10 days have you had a cough,  shortness of breath, headache, congestion, fever (100 or greater) body aches, chills, sore throat, or sudden loss of taste or sense of smell? . Have you been around anyone with known Covid 19. . Have you been around anyone who is awaiting Covid 19 test results in the past 7 to 10 days? . Have you been around anyone who has been exposed to Covid 19, or has mentioned symptoms of Covid 19 within the past 7 to 10 days?  If you have any concerns/questions about symptoms patients report during screening (either on the phone or at threshold). Contact the provider seeing the patient or DOD for further guidance.  If neither are available contact a member of the leadership team.            Contacted Pt via phone call . Answered all Covid 19 questions no. Has a mask.KB

## 2018-09-25 ENCOUNTER — Other Ambulatory Visit: Payer: Self-pay

## 2018-09-25 ENCOUNTER — Other Ambulatory Visit: Payer: Medicare Other

## 2018-09-25 DIAGNOSIS — I10 Essential (primary) hypertension: Secondary | ICD-10-CM

## 2018-09-25 DIAGNOSIS — E7800 Pure hypercholesterolemia, unspecified: Secondary | ICD-10-CM | POA: Diagnosis not present

## 2018-09-25 DIAGNOSIS — I255 Ischemic cardiomyopathy: Secondary | ICD-10-CM

## 2018-09-25 LAB — LIPID PANEL
Chol/HDL Ratio: 2.5 ratio (ref 0.0–4.4)
Cholesterol, Total: 152 mg/dL (ref 100–199)
HDL: 62 mg/dL (ref >39)
LDL Calculated: 69 mg/dL (ref 0–99)
Triglycerides: 107 mg/dL (ref 0–149)
VLDL Cholesterol Cal: 21 mg/dL (ref 5–40)

## 2018-09-25 LAB — HEPATIC FUNCTION PANEL
ALT: 24 IU/L (ref 0–32)
AST: 19 IU/L (ref 0–40)
Albumin: 4.8 g/dL (ref 3.8–4.8)
Alkaline Phosphatase: 74 IU/L (ref 39–117)
Bilirubin Total: 0.7 mg/dL (ref 0.0–1.2)
Bilirubin, Direct: 0.19 mg/dL (ref 0.00–0.40)
Total Protein: 6.4 g/dL (ref 6.0–8.5)

## 2018-09-25 LAB — BASIC METABOLIC PANEL
BUN/Creatinine Ratio: 12 (ref 12–28)
BUN: 11 mg/dL (ref 8–27)
CO2: 26 mmol/L (ref 20–29)
Calcium: 10 mg/dL (ref 8.7–10.3)
Chloride: 103 mmol/L (ref 96–106)
Creatinine, Ser: 0.91 mg/dL (ref 0.57–1.00)
GFR calc Af Amer: 77 mL/min/{1.73_m2} (ref >59)
GFR calc non Af Amer: 66 mL/min/{1.73_m2} (ref >59)
Glucose: 100 mg/dL — ABNORMAL HIGH (ref 65–99)
Potassium: 4.3 mmol/L (ref 3.5–5.2)
Sodium: 142 mmol/L (ref 134–144)

## 2018-09-26 ENCOUNTER — Ambulatory Visit (HOSPITAL_COMMUNITY): Payer: Self-pay

## 2018-09-26 ENCOUNTER — Telehealth: Payer: Self-pay | Admitting: Cardiology

## 2018-09-26 ENCOUNTER — Encounter: Payer: Self-pay | Admitting: Family Medicine

## 2018-09-26 NOTE — Telephone Encounter (Signed)
Agree she needs to see her PCP for the pain and BP issues

## 2018-09-26 NOTE — Telephone Encounter (Signed)
  Pt c/o BP issue: STAT if pt c/o blurred vision, one-sided weakness or slurred speech  1. What are your last 5 BP readings?   6/24  150/90 6/23  149/99  pm 6/23  141/87  am 6/22  142/83 6/21  135/82  2. Are you having any other symptoms (ex. Dizziness, headache, blurred vision, passed out)? no  3. What is your BP issue? Patient is having pain in neck and shoulder
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
## 2018-09-27 NOTE — Telephone Encounter (Signed)
Sent My-Chart message
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
## 2018-09-28 ENCOUNTER — Ambulatory Visit (HOSPITAL_COMMUNITY): Payer: Self-pay

## 2018-10-01 ENCOUNTER — Ambulatory Visit (HOSPITAL_COMMUNITY): Payer: Self-pay

## 2018-10-01 ENCOUNTER — Encounter: Payer: Self-pay | Admitting: Family Medicine

## 2018-10-01 ENCOUNTER — Ambulatory Visit (INDEPENDENT_AMBULATORY_CARE_PROVIDER_SITE_OTHER): Payer: Medicare Other | Admitting: Family Medicine

## 2018-10-01 ENCOUNTER — Other Ambulatory Visit: Payer: Self-pay

## 2018-10-01 VITALS — BP 142/87

## 2018-10-01 DIAGNOSIS — M5412 Radiculopathy, cervical region: Secondary | ICD-10-CM

## 2018-10-01 DIAGNOSIS — I10 Essential (primary) hypertension: Secondary | ICD-10-CM | POA: Diagnosis not present

## 2018-10-01 MED ORDER — HYDROCHLOROTHIAZIDE 25 MG PO TABS: 25.0000 mg | ORAL_TABLET | Freq: Every day | ORAL | 1 refills | Status: DC
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
## 2018-10-02 ENCOUNTER — Other Ambulatory Visit: Payer: Self-pay | Admitting: Family Medicine

## 2018-10-03 ENCOUNTER — Ambulatory Visit (HOSPITAL_COMMUNITY): Payer: Self-pay

## 2018-10-03 ENCOUNTER — Encounter (HOSPITAL_BASED_OUTPATIENT_CLINIC_OR_DEPARTMENT_OTHER): Payer: Medicare Other

## 2018-10-03 NOTE — Telephone Encounter (Signed)
RF request for alprazolam. Last OV 10/01/2018 Next OV 10/10/2018 Last RX 03/26/2018 # 270 X 1 RF.  Please advise.

## 2018-10-09 DIAGNOSIS — M5412 Radiculopathy, cervical region: Secondary | ICD-10-CM | POA: Diagnosis not present

## 2018-10-10 ENCOUNTER — Encounter: Payer: Self-pay | Admitting: Family Medicine

## 2018-10-10 ENCOUNTER — Other Ambulatory Visit: Payer: Self-pay

## 2018-10-10 ENCOUNTER — Ambulatory Visit (INDEPENDENT_AMBULATORY_CARE_PROVIDER_SITE_OTHER): Payer: Medicare Other | Admitting: Family Medicine

## 2018-10-10 VITALS — BP 110/70 | HR 56 | Temp 98.0°F | Resp 16 | Ht 67.5 in | Wt 177.2 lb

## 2018-10-10 DIAGNOSIS — I10 Essential (primary) hypertension: Secondary | ICD-10-CM

## 2018-10-10 DIAGNOSIS — G8929 Other chronic pain: Secondary | ICD-10-CM | POA: Diagnosis not present

## 2018-10-10 DIAGNOSIS — Z9114 Patient's other noncompliance with medication regimen: Secondary | ICD-10-CM | POA: Diagnosis not present

## 2018-10-10 DIAGNOSIS — M542 Cervicalgia: Secondary | ICD-10-CM

## 2018-10-10 NOTE — Progress Notes (Signed)
OFFICE VISIT  10/10/2018   CC:  Chief Complaint  Patient presents with  . Follow-up    hypertension   HPI:    Patient is a 68 y.o. Caucasian female who presents for f/u uncontrolled HTN and for f/u cervical spine pain with radiculopathy. I last saw her 10 d/a, virtual visit, started her on hctz 25mg  qd and referred her to PT.  Interim hx:  She has started PT and so far so good.  The bp's and HR's she brought in for review today from the last 2 wks or so are excellent: avg 120s/70s, HR avg high 70s. Unfortunately, she is confused about what meds she is taking.  Specifically, she can't recall if she has been taking the losartan that Dr. Radford Pax had started her on back in June.  It is hard to tell from review of EMR whether this med was titrated past the 50mg  qd dosing (her med list info in EMR says "takes 2 tabs per day", however).  She says that losartan has not been in her pill dispensing box "lately" but as per her usual she is easily flustered and we could not get any clear answers today about this.  She has no new complaints.  Past Medical History:  Diagnosis Date  . Alcoholism in remission [facility])    states last alcohol 11 yrs ago  . Anxiety   . Asymptomatic gallstones   . Bipolar 1 disorder ([place])    Admission for mania x 2 (most recent 11/2017)  . Cervical spondylosis without myelopathy 12/19/2013   MRI 02/2014: multilevel DDD/spondylosis, not much change compared to prior MRI.  Pt not in favor of invasive therapy for her neck as of 04/2014.  Marland Kitchen COPD (chronic obstructive pulmonary disease) (HCC)    Moderate: anoro started 04/2017 by pulm, pt symptomatically improved and PFTs stable at f/u 06/2017--pulm f/u planned 1 yr.  . Coronary artery spasm ([place]) 07/11/2018   nitrates=HA. Amlodipine=intol swelling.  Changed to coreg 09/03/18 by cardiologist.  . Dilutional hyponatremia   . Endometritis 05/2017   possible; empiric tx with Flagyl by GYN.  . Environmental allergies   . Fibromyalgia    . GERD (gastroesophageal reflux disease)   . Herpes zoster 08/07/2017   L scapular region  . Hiatal hernia   . History of adenomatous polyp of colon 04/2008; 08/2014   No high grade dysplasia: recall 5 yrs ([name], [name])  . History of basal cell carcinoma excision    NOSE  . History of Helicobacter pylori infection 04/2008   +gastric biopsy (gastritis but no metaplasia, dysplasia, or malignancy identified)  . History of TIA (transient ischemic attack)    secondary to HELLP syndrome 1994 post c/s--  residual memory loss  . Hyperlipidemia    statin started after her NSTEMI: goal  LDL <70.  Marland Kitchen Hypertension   . Internal hemorrhoids   . Ischemic cardiomyopathy 05/2018   Echo EF 45-50% in context of NSTEMI from CA spasm.  . 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 04/2017.  . Memory loss    Abnl MRI brain and CT brain c/w chronic microvascular ischemia.  Repeat MRI brain 02/2014 stable (Dr. Mayra Reel with no plan to f/u with neuro as of 04/2014)  . NSTEMI (non-ST elevated myocardial infarction) ([place]) 05/2018   Echo EF 45-50% in context of NSTEMI from CA spasm.  Marland Kitchen  Osteoporosis 05/2010   2012 'penia; 06/2015 'porosis:  prolia.  repeat DEXA 2 yrs.  As of 12/2016 pt going to discuss w/GYN whether or not to continue prolia.  DEXA 09/2017 T-score -2.2 femur neck.  . Pelvic floor dysfunction    [facility] ([name])  . Postmenopausal vaginal bleeding 05/2017   x 1 small episode; GYN attempted endo bx but unable to penetrate cervix due to severe cerv stenosis (due to postmenopausal state + hx of LEEP).  Endo u/s showed thin endo lining.  Per GYN---no evidence of endomet pathology on w/u---obs as of 07/2017.  Marland Kitchen Prediabetes 06/2016   Fasting gluc 105; HbA1c at that time was 6.0%.  A1c 6.2% 12/2017.  Marland Kitchen  Recurrent kidney stones 08/2013   Left ureteral calculus: Perc nephr--cystoscopy w/ureteroscopy + laser for stone removal.  Residual asymptomatic left renal nephrolithiasis <23mm present post-procedure.  Right sided hydronephrosis-persistent (on u/s)--urol ordered CT to further eval 08/2016: 1.6 cm nonobstructing stone lower pole left kidney, no hydro, plan for PCN extraction ([facility])  . Shortness of breath   . Sprain of neck 12/19/2013  . TMJ (temporomandibular joint disorder)    USES  MOUTH GUARD  . Toe fracture, left 12/2017   2nd toe prox phalanx (immobilization, [name])  . Vitamin D deficiency 05/2011  . Weak urinary stream 07/2016   with elevated PVR and mild left hydronephrosis secondary to this ([facility] started flomax 0.4mg  qd for this).    Past Surgical History:  Procedure Laterality Date  . ANTERIOR CERVICAL DECOMP/DISCECTOMY FUSION  06-30-2009   C3 -- C5  AND EXPLORATION OF FUSION C5-7 W/  PLATE REMOVAL  . BUNIONECTOMY Left   . CERVICAL FUSION  2002   anterior C5 -- C7  . CERVICAL SPINE SURGERY  08-27-1999     C6 -- T1  LAMINECTOMY/  DISKECTOMY  . CESAREAN SECTION  1994  . COLONOSCOPY W/ POLYPECTOMY  04/2008;08/2014   2016 tubular adenoma x 1; +diverticulosis and int/ext hemorrhoids.  Recall 5 yrs  . CYSTOSCOPY WITH URETEROSCOPY AND STENT PLACEMENT Left 08/28/2013   Procedure: CYSTOSCOPY, RGP,  WITH URETEROSCOPY AND STENT REMOVAL;  Surgeon: [name], MD;  Location: [facility];  Service: Urology;  Laterality: Left;  . DEXA  09/2017   T-score -2.2 femur neck: improved compared to 06/2015.  Marland Kitchen ESOPHAGOGASTRODUODENOSCOPY  2010; 08/2014   +Candidal esophagitis; Mild chronic gastritis w/intestinal metaplasia--NEG H pylori, neg for eosinophilic esoph  . HOLMIUM LASER APPLICATION Left 8/65/7846   Procedure: HOLMIUM LASER APPLICATION;  Surgeon: [name], MD;  [facility];  Service: Urology;  Laterality: Left;  . LEFT HEART CATH  AND CORONARY ANGIOGRAPHY N/A 05/15/2018   Evidence for spasm in distal LAD.  Mod RCA atherosclerosis, o/w no CAD.  Procedure: LEFT HEART CATH AND CORONARY ANGIOGRAPHY;  Surgeon: Leonie Man, MD;  Location: [facility];  Service: Cardiovascular;  Laterality: N/A;  . NEGATIVE SLEEP STUDY  04-01-2007  . NEPHROLITHOTOMY Left 08/05/2013   Procedure: NEPHROLITHOTOMY PERCUTANEOUS;  Surgeon: [name], MD;  Location: [facility];  Service: Urology;  Laterality: Left;  . TONSILLECTOMY AND ADENOIDECTOMY  1975  . TOTAL KNEE ARTHROPLASTY Right 2003  . TRANSTHORACIC ECHOCARDIOGRAM  05/2018   (after MI secondary to arterial spasm): EF 45-50%; severe hypokinesis of apical anterolateral, inferolateral , anterior, and inferior LV myocardium.  Marland Kitchen ULTRASOUND EXAM,PELVIC COMPLETE ([facility] HX)  06/25/15   NORMAL (done by GYN)    Outpatient Medications Prior to  Visit  Medication Sig Dispense Refill  . Acetaminophen 500 MG coapsule Take 650 mg by mouth every 6 (six) hours as needed for mild pain or headache.     . ALPRAZolam (XANAX) 1 MG tablet TAKE 1 TABLET (1 MG TOTAL) BY MOUTH 3 (THREE) TIMES DAILY. 270 tablet 1  . ANORO ELLIPTA 62.5-25 MCG/INH AEPB INHALE 1 PUFF BY MOUTH EVERY DAY 60 each 5  . aspirin EC 81 MG EC tablet Take 1 tablet (81 mg total) by mouth daily. 90 tablet 3  . atorvastatin (LIPITOR) 80 MG tablet Take 1 tablet (80 mg total) by mouth daily at 6 PM. 90 tablet 3  . carvedilol (COREG) 25 MG tablet Take 1 tablet (25 mg total) by mouth 2 (two) times daily with a meal. 180 tablet 3  . citalopram (CELEXA) 20 MG tablet TAKE 1 TABLET BY MOUTH TWICE A DAY 180 tablet 2  . cyclobenzaprine (FLEXERIL) 10 MG tablet Take 10 mg by mouth 2 (two) times daily.    Marland Kitchen doxycycline (VIBRAMYCIN) 50 MG capsule Take 50 mg by mouth 2 (two) times daily.   3  . famotidine (PEPCID) 40 MG tablet Take 1 tablet (40 mg total) by mouth daily. 30 tablet 11  . fluticasone (FLONASE) 50 MCG/ACT nasal spray Place 2 sprays into both  nostrils daily. 16 g 6  . hydrochlorothiazide (HYDRODIURIL) 25 MG tablet Take 1 tablet (25 mg total) by mouth daily. 30 tablet 1  . lamoTRIgine (LAMICTAL) 100 MG tablet Take 3 tablets (300 mg total) by mouth at bedtime. 90 tablet 11  . losartan (COZAAR) 50 MG tablet Take 1 tablet (50 mg total) by mouth daily. (Patient taking differently: Take 50 mg by mouth daily. Pt is taking 2 tablets daily.) 90 tablet 3  . Magnesium 250 MG TABS Take 1 tablet by mouth daily.    . montelukast (SINGULAIR) 10 MG tablet TAKE 1 TABLET (10 MG TOTAL) BY MOUTH EVERY EVENING. 90 tablet 4  . Multiple Vitamin (MULTI-VITAMINS) TABS Take 1 tablet by mouth daily.     . polyethylene glycol powder (GLYCOLAX/MIRALAX) powder TAKE 17 G BY MOUTH DAILY. 3162 g 0  . potassium citrate (UROCIT-K) 10 MEQ (1080 MG) SR tablet Take 10 mEq by mouth 2 (two) times a day.     Marland Kitchen Propylene Glycol (SYSTANE COMPLETE) 0.6 % SOLN Apply 1 drop to eye 2 (two) times daily.    Marland Kitchen triamcinolone cream (KENALOG) 0.1 % Apply 1 application topically daily.    . TURMERIC PO Take 1 tablet by mouth 2 (two) times a day.    . zolpidem (AMBIEN) 10 MG tablet Take 1 tablet (10 mg total) by mouth at bedtime. 90 tablet 0  . nitroGLYCERIN (NITROSTAT) 0.4 MG SL tablet Place 1 tablet (0.4 mg total) under the tongue every 5 (five) minutes x 3 doses as needed for chest pain. (Patient not taking: Reported on 10/10/2018) 25 tablet 3  . tamsulosin (FLOMAX) 0.4 MG CAPS capsule Take 0.4 mg by mouth daily.     No facility-administered medications prior to visit.     Allergies  Allergen Reactions  . Ceftin [Cefuroxime Axetil] Anaphylaxis    Swelling of eyes and tongue  . Ciprofloxacin Anaphylaxis    Difficulty breathing and generalized swelling  . Fosamax [Alendronate Sodium] Diarrhea  . Morphine And Related Other (See Comments)    Hallucinations/ disoriented - pt stated, "Only Morphine" - 03/23/17  . Prednisone Other (See Comments)    Hyperactivity  . Seroquel  [Quetiapine] Other (  See Comments)    oversedation    ROS As per HPI  PE: Blood pressure 110/70, pulse (!) 56, temperature 98 F (36.7 C), temperature source Temporal, resp. rate 16, height 5' 7.5" (1.715 m), weight 177 lb 3.2 oz (80.4 kg), SpO2 96 %. Gen: Alert, well appearing.  Patient is oriented to person, place, time, and situation. AFFECT: pleasant, lucid thought and speech. No further exam today.  LABS:    Chemistry      Component Value Date/Time   NA 142 09/25/2018 1007   K 4.3 09/25/2018 1007   CL 103 09/25/2018 1007   CO2 26 09/25/2018 1007   BUN 11 09/25/2018 1007   CREATININE 0.91 09/25/2018 1007   CREATININE 1.27 (H) 05/16/2016 1525      Component Value Date/Time   CALCIUM 10.0 09/25/2018 1007   ALKPHOS 74 09/25/2018 1007   AST 19 09/25/2018 1007   ALT 24 09/25/2018 1007   BILITOT 0.7 09/25/2018 1007       IMPRESSION AND PLAN:  1) HTN: well controlled since last visit when I added hctz 25mg  qd.  However, we need to know whether she has also been taking her losartan or not.  I instructed her to go home and: look at her bottle of losartan and check the fill date, check how many pills were dispensed, and check how many pills are still in the bottle.  We'll do our best from this information to determine how to proceed regarding bp med regimen.  I would rather her be on losartan than HCTZ if I have to choose just one of these meds. However, if she has been taking BOTH since I saw her last then that is great as well. She is to continue to monitor her bp and hr regularly.  2) Acute-on-chronic cervicalgia with radiculopathy: she has started a round of PT just recently.  Doing fine so far.  3) OSA suspected: she has a sleep study scheduled for 10/25/2018.  An After Visit Summary was printed and given to the patient.  Spent 25 min with pt today, with >50% of this time spent in counseling and care coordination regarding the above problems.  FOLLOW UP: Return in about  1 month (around 11/10/2018).  Signed:  Crissie Sickles, MD           10/10/2018

## 2018-10-10 NOTE — Addendum Note (Signed)
Addended by: Deveron Furlong D on: 10/10/2018 02:56 PM   Modules accepted: Orders

## 2018-10-11 DIAGNOSIS — M5412 Radiculopathy, cervical region: Secondary | ICD-10-CM | POA: Diagnosis not present

## 2018-10-16 DIAGNOSIS — M5412 Radiculopathy, cervical region: Secondary | ICD-10-CM | POA: Diagnosis not present

## 2018-10-18 DIAGNOSIS — M5412 Radiculopathy, cervical region: Secondary | ICD-10-CM | POA: Diagnosis not present

## 2018-10-21 ENCOUNTER — Encounter: Payer: Self-pay | Admitting: Family Medicine

## 2018-10-22 ENCOUNTER — Other Ambulatory Visit: Payer: Self-pay

## 2018-10-22 ENCOUNTER — Other Ambulatory Visit (HOSPITAL_COMMUNITY)
Admission: RE | Admit: 2018-10-22 | Discharge: 2018-10-22 | Disposition: A | Discharge status: Home / Self Care | Payer: Medicare Other | Source: Ambulatory Visit | Attending: Cardiology | Admitting: Cardiology

## 2018-10-22 DIAGNOSIS — M5412 Radiculopathy, cervical region: Secondary | ICD-10-CM | POA: Diagnosis not present

## 2018-10-22 DIAGNOSIS — Z1159 Encounter for screening for other viral diseases: Secondary | ICD-10-CM | POA: Diagnosis not present

## 2018-10-22 LAB — SARS CORONAVIRUS 2 (TAT 6-24 HRS): SARS Coronavirus 2: NEGATIVE

## 2018-10-22 MED ORDER — LAMOTRIGINE 100 MG PO TABS: 300.0000 mg | ORAL_TABLET | Freq: Every day | ORAL | 2 refills | Status: DC

## 2018-10-24 ENCOUNTER — Ambulatory Visit: Payer: Medicare Other | Admitting: Physician Assistant

## 2018-10-24 ENCOUNTER — Other Ambulatory Visit: Payer: Self-pay | Admitting: Family Medicine

## 2018-10-25 ENCOUNTER — Telehealth: Payer: Self-pay | Admitting: *Deleted

## 2018-10-25 ENCOUNTER — Ambulatory Visit (HOSPITAL_BASED_OUTPATIENT_CLINIC_OR_DEPARTMENT_OTHER): Payer: Medicare Other | Attending: Cardiology | Admitting: Cardiology

## 2018-10-25 ENCOUNTER — Other Ambulatory Visit: Payer: Self-pay

## 2018-10-25 DIAGNOSIS — R0683 Snoring: Secondary | ICD-10-CM | POA: Insufficient documentation

## 2018-10-25 DIAGNOSIS — R0681 Apnea, not elsewhere classified: Secondary | ICD-10-CM | POA: Diagnosis not present

## 2018-10-25 HISTORY — PX: POLYSOMNOGRAPHY: SHX453

## 2018-10-25 NOTE — Telephone Encounter (Signed)
Spoke with patient everything is ok and she is on track for her sleep study tonight.

## 2018-10-26 DIAGNOSIS — M5412 Radiculopathy, cervical region: Secondary | ICD-10-CM | POA: Diagnosis not present

## 2018-10-29 DIAGNOSIS — M5412 Radiculopathy, cervical region: Secondary | ICD-10-CM | POA: Diagnosis not present

## 2018-10-30 NOTE — Procedures (Signed)
    Patient Name: Erin Lopez, Erin Lopez Date: 10/25/2018 Gender: Female D.O.B: 1951-03-15 Age (years): 62 Referring Provider: Fransico Him MD, ABSM Height (inches): 67 Interpreting Physician: Fransico Him MD, ABSM Weight (lbs): 170 RPSGT: Baxter Flattery BMI: 27 MRN: [id number] Neck Size: 15.00  CLINICAL INFORMATION Sleep Study Type: NPSG  Indication for sleep study: Snoring  Epworth Sleepiness Score: 5  SLEEP STUDY TECHNIQUE As per the AASM Manual for the Scoring of Sleep and Associated Events v2.3 (April 2016) with a hypopnea requiring 4% desaturations.  The channels recorded and monitored were frontal, central and occipital EEG, electrooculogram (EOG), submentalis EMG (chin), nasal and oral airflow, thoracic and abdominal wall motion, anterior tibialis EMG, snore microphone, electrocardiogram, and pulse oximetry.  MEDICATIONS Medications self-administered by patient taken the night of the study : [place] The study was initiated at 11:02:00 PM and ended at 5:27:13 AM.  Sleep onset time was 46.7 minutes and the sleep efficiency was 21.5%. The total sleep time was 83 minutes.  Stage REM latency was N/A minutes.  The patient spent 36.7% of the night in stage N1 sleep, 63.3% in stage N2 sleep, 0.0% in stage N3 and 0% in REM.  Alpha intrusion was absent.  Supine sleep was 0.00%.  RESPIRATORY PARAMETERS The overall apnea/hypopnea index (AHI) was 0.0 per hour. There were 0 total apneas, including 0 obstructive, 0 central and 0 mixed apneas. There were 0 hypopneas and 0 RERAs.  The AHI during Stage REM sleep was N/A per hour.  AHI while supine was N/A per hour.  The mean oxygen saturation was 92.1%. The minimum SpO2 during sleep was 90.0%.  soft snoring was noted during this study.  CARDIAC DATA The 2 lead EKG demonstrated sinus rhythm. The mean heart rate was 63.7 beats per minute. Other EKG findings include: None.  LEG MOVEMENT DATA The total  PLMS were 0 with a resulting PLMS index of 0.0. Associated arousal with leg movement index was 0.0 .  IMPRESSIONS - No significant obstructive sleep apnea occurred during this study (AHI = 0.0/h) but nondiagnostic study due to inadequate sleep time. - No significant central sleep apnea occurred during this study (CAI = 0.0/h). - The patient had minimal or no oxygen desaturation during the study (Min O2 = 90.0%) - The patient snored with soft snoring volume. - No cardiac abnormalities were noted during this study. - Clinically significant periodic limb movements did not occur during sleep. No significant associated arousals.  DIAGNOSIS - Nondiagnostic study due to inadequate sleep time.  RECOMMENDATIONS - Recommend repeat PSG with sleep aide vs. home sleep study.  - Avoid alcohol, sedatives and other CNS depressants that may worsen sleep apnea and disrupt normal sleep architecture. - Sleep hygiene should be reviewed to assess factors that may improve sleep quality. - Weight management and regular exercise should be initiated or continued if appropriate.  [Electronically signed] 10/30/2018 01:21 PM  Fransico Him MD, ABSM Diplomate, American Board of Sleep Medicine

## 2018-11-01 ENCOUNTER — Telehealth: Payer: Self-pay | Admitting: *Deleted

## 2018-11-01 DIAGNOSIS — R0681 Apnea, not elsewhere classified: Secondary | ICD-10-CM

## 2018-11-01 DIAGNOSIS — I10 Essential (primary) hypertension: Secondary | ICD-10-CM

## 2018-11-01 DIAGNOSIS — M5412 Radiculopathy, cervical region: Secondary | ICD-10-CM | POA: Diagnosis not present

## 2018-11-01 NOTE — Telephone Encounter (Signed)
-----   Message from Sueanne Margarita, MD sent at 10/30/2018  1:24 PM EDT ----- Nondiagnostic sleep study due to lack of adequate sleep time.  Please call patient to find out if she wants to repeat PSG in lab with sleep aide or do a home sleep study

## 2018-11-01 NOTE — Telephone Encounter (Signed)
Informed patient of sleep study results and patient understanding was verbalized. Patient understands her sleep study showed Nondiagnostic sleep study due to lack of adequate sleep time. She wants to do a home sleep study. She liked the idea of the [place]amar test.

## 2018-11-01 NOTE — Telephone Encounter (Signed)
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

## 2018-11-02 NOTE — Telephone Encounter (Signed)
  Sueanne Margarita, MD  Freada Bergeron, CMA        Please order Itamar home sleep study   [facility]

## 2018-11-05 DIAGNOSIS — M5412 Radiculopathy, cervical region: Secondary | ICD-10-CM | POA: Diagnosis not present

## 2018-11-06 ENCOUNTER — Telehealth: Payer: Self-pay

## 2018-11-06 ENCOUNTER — Encounter: Payer: Self-pay | Admitting: Family Medicine

## 2018-11-06 NOTE — Addendum Note (Signed)
Addended by: Freada Bergeron on: 11/06/2018 04:49 PM   Modules accepted: Orders

## 2018-11-06 NOTE — Telephone Encounter (Signed)
Received PT re-evaluation form regarding treatment and plan of care/recommendations. PCP will review and sign, if appropriate.

## 2018-11-06 NOTE — Telephone Encounter (Signed)
Signed and put in box to go up front.  

## 2018-11-07 ENCOUNTER — Other Ambulatory Visit: Payer: Self-pay

## 2018-11-07 ENCOUNTER — Encounter: Payer: Self-pay | Admitting: Family Medicine

## 2018-11-07 ENCOUNTER — Ambulatory Visit (INDEPENDENT_AMBULATORY_CARE_PROVIDER_SITE_OTHER): Payer: Medicare Other | Admitting: Family Medicine

## 2018-11-07 VITALS — BP 101/68 | HR 69 | Temp 97.5°F | Resp 16 | Ht 67.5 in | Wt 177.2 lb

## 2018-11-07 DIAGNOSIS — I10 Essential (primary) hypertension: Secondary | ICD-10-CM | POA: Diagnosis not present

## 2018-11-07 DIAGNOSIS — M810 Age-related osteoporosis without current pathological fracture: Secondary | ICD-10-CM | POA: Diagnosis not present

## 2018-11-07 DIAGNOSIS — F3178 Bipolar disorder, in full remission, most recent episode mixed: Secondary | ICD-10-CM

## 2018-11-07 DIAGNOSIS — Z79899 Other long term (current) drug therapy: Secondary | ICD-10-CM

## 2018-11-07 DIAGNOSIS — F411 Generalized anxiety disorder: Secondary | ICD-10-CM | POA: Diagnosis not present

## 2018-11-07 LAB — BASIC METABOLIC PANEL
BUN: 11 mg/dL (ref 6–23)
CO2: 27 mEq/L (ref 19–32)
Calcium: 9.5 mg/dL (ref 8.4–10.5)
Chloride: 97 mEq/L (ref 96–112)
Creatinine, Ser: 0.85 mg/dL (ref 0.40–1.20)
GFR: 66.95 mL/min (ref >60.00)
Glucose, Bld: 100 mg/dL — ABNORMAL HIGH (ref 70–99)
Potassium: 3.8 mEq/L (ref 3.5–5.1)
Sodium: 131 mEq/L — ABNORMAL LOW (ref 135–145)

## 2018-11-07 NOTE — Progress Notes (Signed)
OFFICE VISIT  11/07/2018   CC:  Chief Complaint  Patient presents with  . Follow-up    hypertension, pt is not fasting   HPI:    Patient is a 68 y.o. Caucasian female who presents for 1 mo f/u HTN (we clarified after last visit that she is taking both Losartan and hctz).  Interim hx: Her sleep study in lab was nondiagnostic b/c not enough sleep time. Question of whether or not this will be repeated as a home study.  HTN: her bp goes up with pain, into 140s sometimes, <130over 80 when not in pain. Tolerating her meds fine. Recently had to put her horse down 2 d/a, very distressing.  Psych: taking xanax tid, lamictal 300 mg qd, and ambien 10mg  qhs. Takes alpraz q8h.  Hx of osteoporosis, was on prolia injections but this seems to have fallen to the side for unknown reason. No dental/jaw issues.    Cervicalgia with question of L radiculopathy: is in PT and gets benefit from it.  ROS: no CP, no SOB, no wheezing, no cough, no dizziness, no HAs, no rashes, no melena/hematochezia.  No polyuria or polydipsia.     Past Medical History:  Diagnosis Date  . Alcoholism in remission [facility])    states last alcohol 11 yrs ago  . Anxiety   . Asymptomatic gallstones   . Bipolar 1 disorder ([place])    Admission for mania x 2 (most recent 11/2017)  . Cervical spondylosis without myelopathy 12/19/2013   MRI 02/2014: multilevel DDD/spondylosis, not much change compared to prior MRI.  Pt not in favor of invasive therapy for her neck as of 04/2014.  Marland Kitchen COPD (chronic obstructive pulmonary disease) (HCC)    Moderate: anoro started 04/2017 by pulm, pt symptomatically improved and PFTs stable at f/u 06/2017--pulm f/u planned 1 yr.  . Coronary artery spasm ([place]) 07/11/2018   nitrates=HA. Amlodipine=intol swelling.  Changed to coreg 09/03/18 by cardiologist.  . Dilutional hyponatremia   . Endometritis 05/2017   possible; empiric tx with Flagyl by GYN.  . Environmental allergies   . Fibromyalgia   . GERD  (gastroesophageal reflux disease)   . Herpes zoster 08/07/2017   L scapular region  . Hiatal hernia   . History of adenomatous polyp of colon 04/2008; 08/2014   No high grade dysplasia: recall 5 yrs ([name], [name])  . History of basal cell carcinoma excision    NOSE  . History of Helicobacter pylori infection 04/2008   +gastric biopsy (gastritis but no metaplasia, dysplasia, or malignancy identified)  . History of TIA (transient ischemic attack)    secondary to HELLP syndrome 1994 post c/s--  residual memory loss  . Hyperlipidemia    statin started after her NSTEMI: goal  LDL <70.  Marland Kitchen Hypertension   . Internal hemorrhoids   . Ischemic cardiomyopathy 05/2018   Echo EF 45-50% in context of NSTEMI from CA spasm.  . 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 04/2017.  . Memory loss    Abnl MRI brain and CT brain c/w chronic microvascular ischemia.  Repeat MRI brain 02/2014 stable (Dr. Mayra Reel with no plan to f/u with neuro as of 04/2014)  . NSTEMI (non-ST elevated myocardial infarction) ([place]) 05/2018   Echo EF 45-50% in context of NSTEMI from CA spasm.  . Osteoporosis 05/2010   2012 'penia; 06/2015 'porosis:  prolia.  repeat DEXA 2 yrs.  As of 12/2016 pt going to discuss w/GYN whether or not to continue prolia.  DEXA 09/2017 T-score -2.2 femur neck.  . Pelvic floor dysfunction    [facility] ([name])  . Postmenopausal vaginal bleeding 05/2017   x 1 small episode; GYN attempted endo bx but unable to penetrate cervix due to severe cerv stenosis (due to postmenopausal state + hx of LEEP).  Endo u/s showed thin endo lining.  Per GYN---no evidence of endomet pathology on w/u---obs as of 07/2017.  Marland Kitchen Prediabetes 06/2016   Fasting gluc 105; HbA1c at that time was 6.0%.  A1c 6.2% 12/2017.  Marland Kitchen Recurrent kidney  stones 08/2013   Left ureteral calculus: Perc nephr--cystoscopy w/ureteroscopy + laser for stone removal.  Residual asymptomatic left renal nephrolithiasis <69mm present post-procedure.  Right sided hydronephrosis-persistent (on u/s)--urol ordered CT to further eval 08/2016: 1.6 cm nonobstructing stone lower pole left kidney, no hydro, plan for PCN extraction ([facility])  . Shortness of breath   . Sprain of neck 12/19/2013  . TMJ (temporomandibular joint disorder)    USES  MOUTH GUARD  . Toe fracture, left 12/2017   2nd toe prox phalanx (immobilization, [name])  . Vitamin D deficiency 05/2011  . Weak urinary stream 07/2016   with elevated PVR and mild left hydronephrosis secondary to this ([facility] started flomax 0.4mg  qd for this).    Past Surgical History:  Procedure Laterality Date  . ANTERIOR CERVICAL DECOMP/DISCECTOMY FUSION  06-30-2009   C3 -- C5  AND EXPLORATION OF FUSION C5-7 W/  PLATE REMOVAL  . BUNIONECTOMY Left   . CERVICAL FUSION  2002   anterior C5 -- C7  . CERVICAL SPINE SURGERY  08-27-1999     C6 -- T1  LAMINECTOMY/  DISKECTOMY  . CESAREAN SECTION  1994  . COLONOSCOPY W/ POLYPECTOMY  04/2008;08/2014   2016 tubular adenoma x 1; +diverticulosis and int/ext hemorrhoids.  Recall 5 yrs  . CYSTOSCOPY WITH URETEROSCOPY AND STENT PLACEMENT Left 08/28/2013   Procedure: CYSTOSCOPY, RGP,  WITH URETEROSCOPY AND STENT REMOVAL;  Surgeon: [name], MD;  Location[facility];  Service: Urology;  Laterality: Left;  . DEXA  09/2017   T-score -2.2 femur neck: improved compared to 06/2015.  Marland Kitchen ESOPHAGOGASTRODUODENOSCOPY  2010; 08/2014   +Candidal esophagitis; Mild chronic gastritis w/intestinal metaplasia--NEG H pylori, neg for eosinophilic esoph  . HOLMIUM LASER APPLICATION Left 07/26/5359   Procedure: HOLMIUM LASER APPLICATION;  Surgeon: [name], MD;  Location: [facility];  Service: Urology;  Laterality: Left;  . LEFT HEART CATH AND CORONARY  ANGIOGRAPHY N/A 05/15/2018   Evidence for spasm in distal LAD.  Mod RCA atherosclerosis, o/w no CAD.  Procedure: LEFT HEART CATH AND CORONARY ANGIOGRAPHY;  Surgeon: Leonie Man, MD;  Location: [place] [facility];  Service: Cardiovascular;  Laterality: N/A;  . NEGATIVE SLEEP STUDY  04-01-2007  . NEPHROLITHOTOMY Left 08/05/2013   Procedure: NEPHROLITHOTOMY PERCUTANEOUS;  Surgeon: [name], MD;  Location: [facility];  Service: Urology;  Laterality: Left;  . POLYSOMNOGRAPHY  10/25/2018   Dr. Dion Saucier due to pt inadequat sleep time (83 min).  . TONSILLECTOMY AND ADENOIDECTOMY  1975  . TOTAL KNEE ARTHROPLASTY Right 2003  . TRANSTHORACIC ECHOCARDIOGRAM  05/2018   (after MI secondary to arterial spasm): EF 45-50%; severe hypokinesis of apical anterolateral, inferolateral , anterior, and inferior LV myocardium.  Marland Kitchen ULTRASOUND EXAM,PELVIC COMPLETE ([facility] HX)  06/25/15   NORMAL (done  by GYN)    Outpatient Medications Prior to Visit  Medication Sig Dispense Refill  . Acetaminophen 500 MG coapsule Take 650 mg by mouth every 6 (six) hours as needed for mild pain or headache.     . ALPRAZolam (XANAX) 1 MG tablet TAKE 1 TABLET (1 MG TOTAL) BY MOUTH 3 (THREE) TIMES DAILY. 270 tablet 1  . ANORO ELLIPTA 62.5-25 MCG/INH AEPB INHALE 1 PUFF BY MOUTH EVERY DAY 60 each 5  . aspirin EC 81 MG EC tablet Take 1 tablet (81 mg total) by mouth daily. 90 tablet 3  . atorvastatin (LIPITOR) 80 MG tablet Take 1 tablet (80 mg total) by mouth daily at 6 PM. 90 tablet 3  . carvedilol (COREG) 25 MG tablet Take 1 tablet (25 mg total) by mouth 2 (two) times daily with a meal. 180 tablet 3  . citalopram (CELEXA) 20 MG tablet TAKE 1 TABLET BY MOUTH TWICE A DAY 180 tablet 2  . cyclobenzaprine (FLEXERIL) 10 MG tablet Take 10 mg by mouth 2 (two) times daily.    Marland Kitchen doxycycline (VIBRAMYCIN) 50 MG capsule Take 50 mg by mouth 2 (two) times daily.   3  . famotidine (PEPCID) 40 MG tablet Take 1 tablet (40 mg total) by  mouth daily. 30 tablet 11  . fluticasone (FLONASE) 50 MCG/ACT nasal spray Place 2 sprays into both nostrils daily. 16 g 6  . hydrochlorothiazide (HYDRODIURIL) 25 MG tablet TAKE 1 TABLET BY MOUTH EVERY DAY 30 tablet 0  . lamoTRIgine (LAMICTAL) 100 MG tablet Take 3 tablets (300 mg total) by mouth at bedtime. 90 tablet 2  . losartan (COZAAR) 50 MG tablet Take 1 tablet (50 mg total) by mouth daily. (Patient taking differently: Take 50 mg by mouth daily. Pt is taking 2 tablets daily.) 90 tablet 3  . Magnesium 250 MG TABS Take 1 tablet by mouth daily.    . montelukast (SINGULAIR) 10 MG tablet TAKE 1 TABLET (10 MG TOTAL) BY MOUTH EVERY EVENING. 90 tablet 4  . Multiple Vitamin (MULTI-VITAMINS) TABS Take 1 tablet by mouth daily.     . polyethylene glycol powder (GLYCOLAX/MIRALAX) powder TAKE 17 G BY MOUTH DAILY. 3162 g 0  . potassium citrate (UROCIT-K) 10 MEQ (1080 MG) SR tablet Take 10 mEq by mouth 2 (two) times a day.     Marland Kitchen Propylene Glycol (SYSTANE COMPLETE) 0.6 % SOLN Apply 1 drop to eye 2 (two) times daily.    . tamsulosin (FLOMAX) 0.4 MG CAPS capsule Take 0.4 mg by mouth daily.    Marland Kitchen triamcinolone cream (KENALOG) 0.1 % Apply 1 application topically daily.    . TURMERIC PO Take 1 tablet by mouth 2 (two) times a day.    . zolpidem (AMBIEN) 10 MG tablet Take 1 tablet (10 mg total) by mouth at bedtime. 90 tablet 0  . nitroGLYCERIN (NITROSTAT) 0.4 MG SL tablet Place 1 tablet (0.4 mg total) under the tongue every 5 (five) minutes x 3 doses as needed for chest pain. (Patient not taking: Reported on 10/10/2018) 25 tablet 3   No facility-administered medications prior to visit.     Allergies  Allergen Reactions  . Ceftin [Cefuroxime Axetil] Anaphylaxis    Swelling of eyes and tongue  . Ciprofloxacin Anaphylaxis    Difficulty breathing and generalized swelling  . Fosamax [Alendronate Sodium] Diarrhea  . Morphine And Related Other (See Comments)    Hallucinations/ disoriented - pt stated, "Only Morphine"  - 03/23/17  . Prednisone Other (See Comments)  Hyperactivity  . Seroquel [Quetiapine] Other (See Comments)    oversedation    ROS As per HPI  PE: Blood pressure 101/68, pulse 69, temperature (!) 97.5 F (36.4 C), temperature source Temporal, resp. rate 16, height 5' 7.5" (1.715 m), weight 177 lb 3.2 oz (80.4 kg), SpO2 94 %.  VITALS: Blood pressure 101/68, pulse 69, temperature (!) 97.5 F (36.4 C), temperature source Temporal, resp. rate 16, height 5' 7.5" (1.715 m), weight 177 lb 3.2 oz (80.4 kg), SpO2 94 %. Gen: alert, well appearing. HEENT: eyes without swelling, eryth, or drainage. Neck: no adenopathy or thyromegaly. Lungs: CTA bilat, nonlabored resps.  CV: RRR, no m/r/g EXT: no edema  LABS:  Lab Results  Component Value Date   HGBA1C 5.7 (H) 05/14/2018     Chemistry      Component Value Date/Time   NA 142 09/25/2018 1007   K 4.3 09/25/2018 1007   CL 103 09/25/2018 1007   CO2 26 09/25/2018 1007   BUN 11 09/25/2018 1007   CREATININE 0.91 09/25/2018 1007   CREATININE 1.27 (H) 05/16/2016 1525      Component Value Date/Time   CALCIUM 10.0 09/25/2018 1007   ALKPHOS 74 09/25/2018 1007   AST 19 09/25/2018 1007   ALT 24 09/25/2018 1007   BILITOT 0.7 09/25/2018 1007     Lab Results  Component Value Date   WBC 7.7 05/30/2018   HGB 10.6 (L) 05/30/2018   HCT 32.9 (L) 05/30/2018   MCV 95.1 05/30/2018   PLT 255 05/30/2018   Lab Results  Component Value Date   CHOL 152 09/25/2018   HDL 62 09/25/2018   LDLCALC 69 09/25/2018   LDLDIRECT 144.1 05/29/2013   TRIG 107 09/25/2018   CHOLHDL 2.5 09/25/2018   Lab Results  Component Value Date   TSH 1.766 11/16/2017    IMPRESSION AND PLAN:  1) HTN; well controlled.  No med changes today. BMET today.  2) OSA-likely will get home sleep study since she did not sleep enough for a diagnostic study when lab polysomn was done recently.  3) Bipolar d/o: The current medical regimen is effective;  continue present plan and  medications.  4) GAD: xanax tid. UDS today. CSC UTD but we'll update this again at CPE in 3 mo.  5) Osteoporosis: we had her on prolia most recently but we'll have to see when her last injection was, need to restart this.  Plan repeat DEXA 1 yr from her next prolia injection. Cont ca and vit D.  FOLLOW UP: Return in about 3 months (around 02/07/2019) for annual CPE (fasting).  Signed:  Crissie Sickles, MD           11/07/2018

## 2018-11-08 ENCOUNTER — Ambulatory Visit: Payer: Medicare Other

## 2018-11-08 ENCOUNTER — Telehealth: Payer: Self-pay

## 2018-11-08 DIAGNOSIS — F411 Generalized anxiety disorder: Secondary | ICD-10-CM

## 2018-11-08 DIAGNOSIS — M5412 Radiculopathy, cervical region: Secondary | ICD-10-CM | POA: Diagnosis not present

## 2018-11-08 DIAGNOSIS — Z79899 Other long term (current) drug therapy: Secondary | ICD-10-CM | POA: Diagnosis not present

## 2018-11-08 NOTE — Telephone Encounter (Signed)
MyChart message sent with results.  

## 2018-11-10 LAB — PAIN MGMT, PROFILE 8 W/CONF, U
6 Acetylmorphine: NEGATIVE ng/mL
Alcohol Metabolites: NEGATIVE ng/mL (ref <500)
Alphahydroxyalprazolam: 411 ng/mL
Alphahydroxymidazolam: NEGATIVE ng/mL
Alphahydroxytriazolam: NEGATIVE ng/mL
Aminoclonazepam: NEGATIVE ng/mL
Amphetamines: NEGATIVE ng/mL
Benzodiazepines: POSITIVE ng/mL
Buprenorphine, Urine: NEGATIVE ng/mL
Cocaine Metabolite: NEGATIVE ng/mL
Creatinine: 38.6 mg/dL
Hydroxyethylflurazepam: NEGATIVE ng/mL
Lorazepam: NEGATIVE ng/mL
MDMA: NEGATIVE ng/mL
Marijuana Metabolite: NEGATIVE ng/mL
Nordiazepam: NEGATIVE ng/mL
Opiates: NEGATIVE ng/mL
Oxazepam: NEGATIVE ng/mL
Oxidant: NEGATIVE ug/mL
Oxycodone: NEGATIVE ng/mL
Temazepam: NEGATIVE ng/mL
pH: 7 (ref 4.5–9.0)

## 2018-11-12 ENCOUNTER — Telehealth: Payer: Self-pay

## 2018-11-12 DIAGNOSIS — M5412 Radiculopathy, cervical region: Secondary | ICD-10-CM | POA: Diagnosis not present

## 2018-11-12 NOTE — Telephone Encounter (Signed)
MyChart message sent with results.  

## 2018-11-15 DIAGNOSIS — M5412 Radiculopathy, cervical region: Secondary | ICD-10-CM | POA: Diagnosis not present

## 2018-11-15 NOTE — Telephone Encounter (Addendum)
Patient Name: Erin Lopez        DOB: Dec 07, 2050      Height: 5'7"    Weight: 170lb  Office Name: [facility]         Referring Provider: Fransico Him  Today's Date: 11/15/18  Date:   STOP BANG RISK ASSESSMENT S (snore) Have you been told that you snore?     YES   T (tired) Are you often tired, fatigued, or sleepy during the day?   NO  O (obstruction) Do you stop breathing, choke, or gasp during sleep? NO   P (pressure) Do you have or are you being treated for high blood pressure? YES   B (BMI) Is your body index greater than 35 kg/m? NO   A (age) Are you 39 years old or older? YES   N (neck) Do you have a neck circumference greater than 16 inches?   NO   G (gender) Are you a female? /NO   TOTAL STOP/BANG "YES" ANSWERS                                                                        For Office Use Only              Procedure Order Form    YES to 3+ Stop Bang questions OR two clinical symptoms - patient qualifies for WatchPAT (CPT 95800)     Submit: This Form + Patient Face Sheet + Clinical Note via CloudPAT or Fax: [phone]         Clinical Notes: Will consult Sleep Specialist and refer for management of therapy due to patient increased risk of Sleep Apnea. Ordering a sleep study due to the following two clinical symptoms: Excessive daytime sleepiness G47.10 / Gastroesophageal reflux K21.9 / Nocturia R35.1 / Morning Headaches G44.221 / Difficulty concentrating R41.840 / Memory problems or poor judgment G31.84 / Personality changes or irritability R45.4 / Loud snoring R06.83 / Depression F32.9 / Unrefreshed by sleep G47.8 / Impotence N52.9 / History of high blood pressure R03.0 / Insomnia G47.00    I understand that I am proceeding with a home sleep apnea test as ordered by my treating physician. I understand that untreated sleep apnea is a serious cardiovascular risk factor and it is my responsibility to perform the test and seek management for sleep apnea. I  will be contacted with the results and be managed for sleep apnea by a local sleep physician. I will be receiving equipment and further instructions from [facility]. I shall promptly ship back the equipment via the included mailing label. I understand my insurance will be billed for the test and as the patient I am responsible for any insurance related out-of-pocket costs incurred. I have been provided with written instructions and can call for additional video or telephonic instruction, with 24-hour availability of qualified personnel to answer any questions: Patient Help Desk [phone].  Patient Signature _________________[name]_____________________________________   Date___________8/13/20___________ Patient Telemedicine Verbal Consent

## 2018-11-16 ENCOUNTER — Other Ambulatory Visit: Payer: Self-pay

## 2018-11-16 NOTE — Telephone Encounter (Signed)
[facility] [address] 

## 2018-11-16 NOTE — Addendum Note (Signed)
Addended by: Caroll Rancher L on: 11/16/2018 03:42 PM   Modules accepted: Orders

## 2018-11-16 NOTE — Telephone Encounter (Signed)
Faxed refill request from [facility]  RF request for Cyclobenzaprine 10mg  tablets  LOV: 11/07/2018 Next ov: 02/19/2019 Last written: Never RX by Dr Anitra Lauth and does not say who RX this medication   Pt was called to ask why and if she needs this medication. No answer and no VM. Will try to call patient again.

## 2018-11-19 DIAGNOSIS — M5412 Radiculopathy, cervical region: Secondary | ICD-10-CM | POA: Diagnosis not present

## 2018-11-20 MED ORDER — CYCLOBENZAPRINE HCL 10 MG PO TABS: 10.0000 mg | ORAL_TABLET | Freq: Two times a day (BID) | ORAL | 6 refills | Status: DC

## 2018-11-22 ENCOUNTER — Other Ambulatory Visit: Payer: Self-pay

## 2018-11-22 ENCOUNTER — Telehealth: Payer: Self-pay

## 2018-11-22 DIAGNOSIS — M5412 Radiculopathy, cervical region: Secondary | ICD-10-CM | POA: Diagnosis not present

## 2018-11-22 MED ORDER — HYDROCHLOROTHIAZIDE 25 MG PO TABS: 25.0000 mg | ORAL_TABLET | Freq: Every day | ORAL | 1 refills | Status: DC

## 2018-11-22 NOTE — Telephone Encounter (Signed)
Received PT re-evaluation form. PCP will review and sign, if appropriate.

## 2018-11-22 NOTE — Telephone Encounter (Signed)
Signed and put in box to go up front.  

## 2018-11-23 ENCOUNTER — Telehealth: Payer: Self-pay

## 2018-11-23 NOTE — Telephone Encounter (Signed)
Prolia rep contacted and pt's estimated cost per injection would be roughly $270. LM for pt to call back to notify.

## 2018-11-23 NOTE — Telephone Encounter (Signed)
Gave patient cost information. Patient wants to think about it and call back.

## 2018-11-26 ENCOUNTER — Encounter: Payer: Self-pay | Admitting: Family Medicine

## 2018-11-26 ENCOUNTER — Other Ambulatory Visit: Payer: Self-pay | Admitting: Family Medicine

## 2018-11-26 NOTE — Telephone Encounter (Signed)
RF request for ambien. Last OV 11/07/2018 Next OV 02/19/2019 Last RX 08/23/2018 # 90 x 0 RF.  Please advise.

## 2018-11-27 ENCOUNTER — Other Ambulatory Visit: Payer: Self-pay

## 2018-11-27 MED ORDER — FAMOTIDINE 40 MG PO TABS: 40.0000 mg | ORAL_TABLET | Freq: Every day | ORAL | 11 refills | Status: DC

## 2018-12-31 ENCOUNTER — Encounter (INDEPENDENT_AMBULATORY_CARE_PROVIDER_SITE_OTHER): Payer: Medicare Other | Admitting: Cardiology

## 2018-12-31 DIAGNOSIS — R0683 Snoring: Secondary | ICD-10-CM | POA: Diagnosis not present

## 2018-12-31 DIAGNOSIS — R0681 Apnea, not elsewhere classified: Secondary | ICD-10-CM | POA: Diagnosis not present

## 2018-12-31 DIAGNOSIS — I10 Essential (primary) hypertension: Secondary | ICD-10-CM | POA: Diagnosis not present

## 2019-01-03 HISTORY — PX: OTHER SURGICAL HISTORY: SHX169

## 2019-01-13 ENCOUNTER — Other Ambulatory Visit: Payer: Self-pay | Admitting: Family Medicine

## 2019-01-14 ENCOUNTER — Other Ambulatory Visit: Payer: Self-pay

## 2019-01-14 ENCOUNTER — Ambulatory Visit: Payer: Medicare Other

## 2019-01-14 NOTE — Procedures (Signed)
   Patient Information Name: Erin Lopez  ID: [id number] Birth Date: February 05, 1951  Age: 68 Gender: Female BMI: 26.6 (W=170 lb, H=5' 7'') Sleep Study Information Study Date:12/31/2018 Referring Physician: Fransico Him, MD   Summary & Diagnosis  TEST DESCRIPTION: Home sleep apnea testing was completed using the WatchPat, a Type 1 device, utilizing peripheral arterial tonometry (PAT), chest movement, actigraphy, pulse oximetry, pulse rate, body position and snore. AHI was calculated with apnea and hypopnea using valid sleep time as the denominator. RDI includes apneas, hypopneas, and RERAs. The data acquired and the scoring of sleep and all associated events were performed in accordance with the recommended standards and specifications as outlined in the AASM Manual for the Scoring of Sleep and Associated Events 2.2.0 (2015).  Findings: 1. Normal study with no significant sleep disordered breathing. 2. Mild snoring was noted. 3. Oxygen desaturations as low as 82% were noted with time spent with O2 sats < 88% 1.3 min. 4. Shortened sleep onset latency at 6 min and delayed REM sleep onset at 151 min.  Diagnosis: Normal Sleep Study  Recommendations 1. Normal study with no significant sleep disordered breathing.  2. An ENT consultation which may be useful for specific causes of and possible treatment of bothersome snoring .  3. Weight loss may be of benefit in reducing the severity of snoring. 3   Report prepared by: Signature: Fransico Him Electronically Signed: Jan 14, 2019 Face-to-face demonstration or video or telephone instructions of the recorder's application and use were provid

## 2019-01-16 ENCOUNTER — Telehealth: Payer: Self-pay | Admitting: *Deleted

## 2019-01-16 NOTE — Telephone Encounter (Signed)
-----   Message from Sueanne Margarita, MD sent at 01/14/2019  1:13 PM EDT ----- Please let patient know that sleep study showed no significant sleep apnea.

## 2019-01-16 NOTE — Telephone Encounter (Signed)
Informed patient of sleep study results and patient understanding was verbalized. Patient understands her sleep study showed no significant sleep apnea.   Pt is aware and agreeable to normal results.  

## 2019-01-20 ENCOUNTER — Encounter: Payer: Self-pay | Admitting: Family Medicine

## 2019-01-27 IMAGING — CR DG CHEST 1V
1 series · 1 of 1 positions shown · non-contrast
Comparison: 11/16/2017

CLINICAL DATA: Chest pain and confusion. Difficulty urinating.
Altered mental status.

EXAM:
CHEST  1 VIEW

[chest ap]
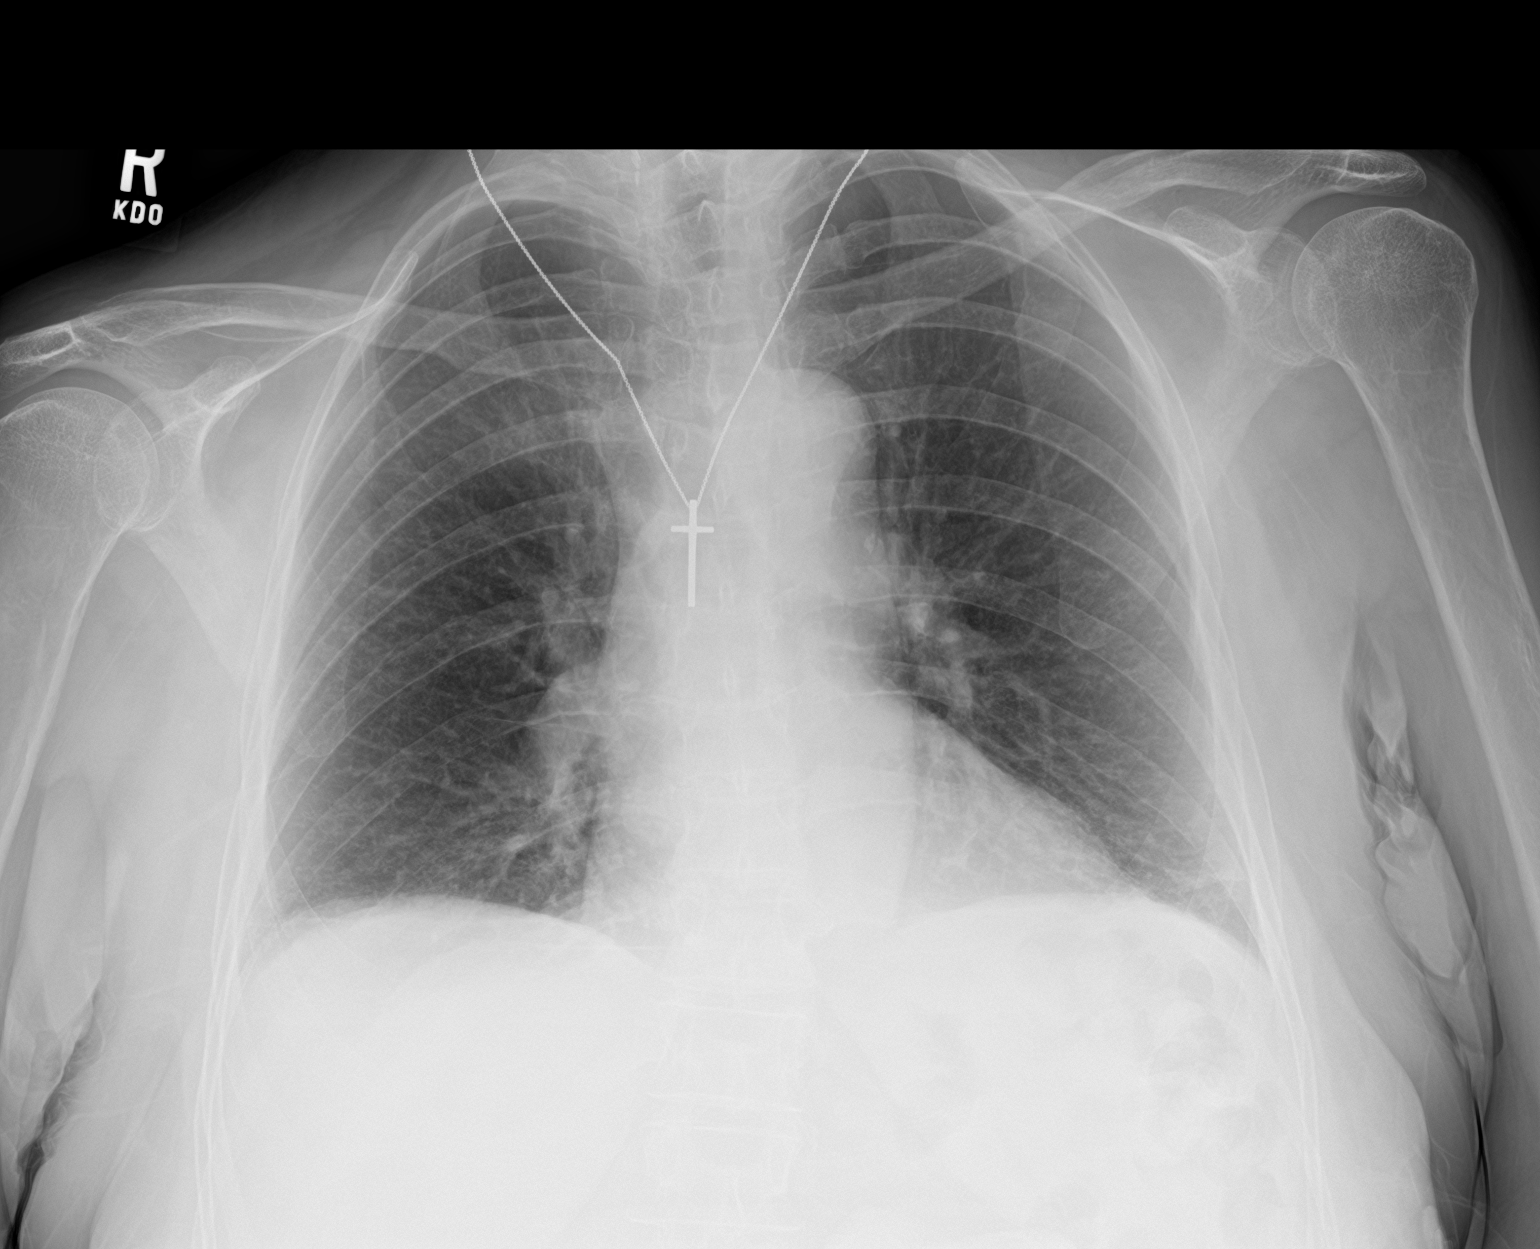

[1 of 1 positions shown; findings below may reference images not displayed]

FINDINGS: The heart size and mediastinal contours are within normal limits.
Both lungs are clear. The visualized skeletal structures are
unremarkable.
IMPRESSION: No active disease.

## 2019-01-28 IMAGING — MR MR HEAD W/O CM
9 of 11 series · 33 of 48 positions shown · non-contrast
Comparison: Head CT 11/16/2017 and MRI 02/04/2014

CLINICAL DATA: Altered mental status.  Hyponatremia.

EXAM:
MRI HEAD WITHOUT CONTRAST
TECHNIQUE: Multiplanar, multiecho pulse sequences of the brain and surrounding
structures were obtained without intravenous contrast.

[Series 3: DWI · axial · 3.0mm · 0.94mm/px · z∈[-68,+95]mm · 7 of 112 slices shown (1 of 2)]
[im 1/112]
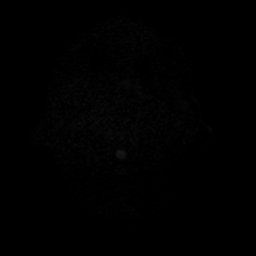
[im 19/112]
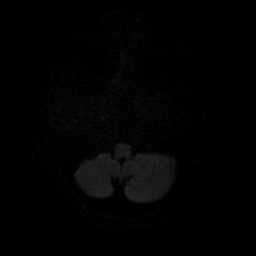
[im 38/112]
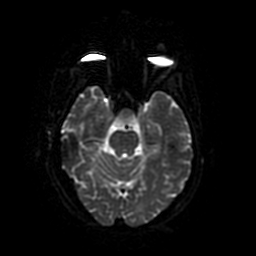
[im 56/112]
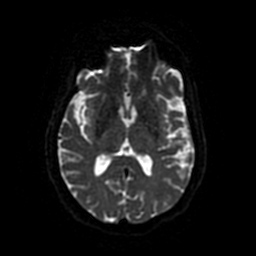
[im 75/112]
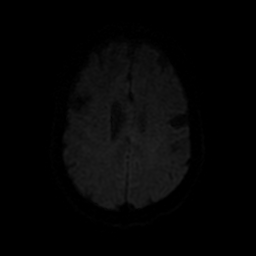
[im 93/112]
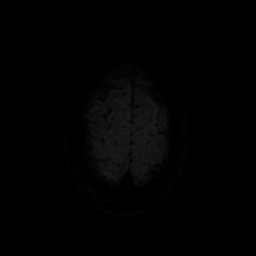
[im 112/112]
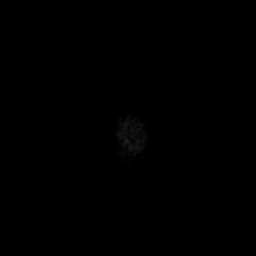

[Series 4: DWI · coronal · 4.0mm · 0.94mm/px · 6 of 78 slices shown (2 of 2)]
[im 1/78]
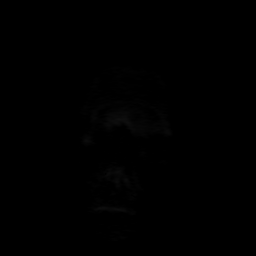
[im 16/78]
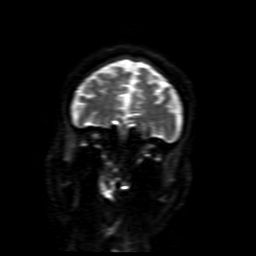
[im 31/78]
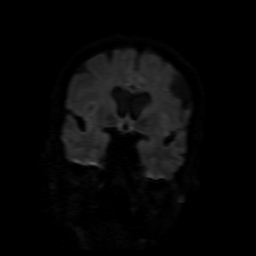
[im 47/78]
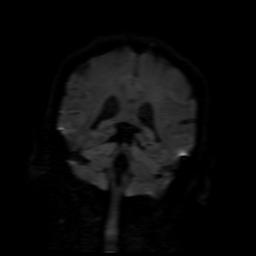
[im 62/78]
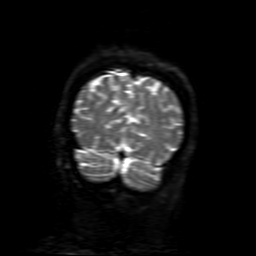
[im 78/78]
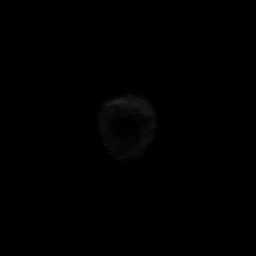

[Series 6: FLAIR · sagittal · 5.0mm · 0.47mm/px · 2 of 25 slices shown (1 of 2)]
[im 1/25]
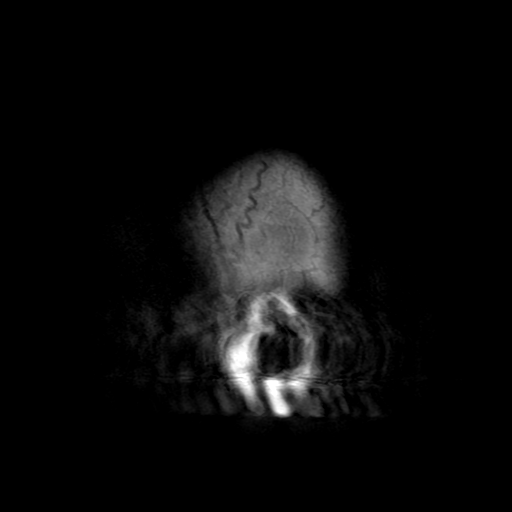
[im 25/25]
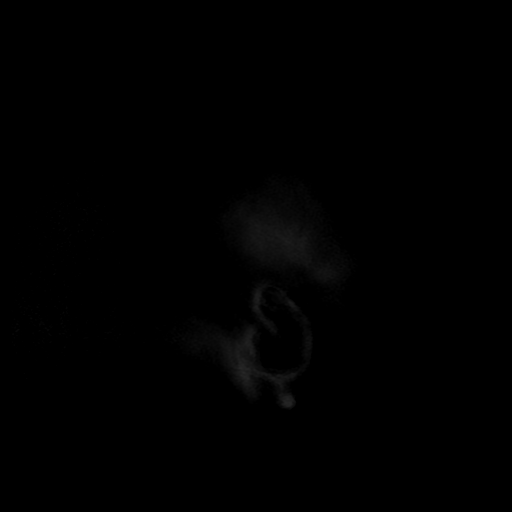

[Series 8: T2 · axial · 5.0mm · 0.47mm/px · z∈[-63,+104]mm · 2 of 29 slices shown (1 of 2)]
[im 1/29]
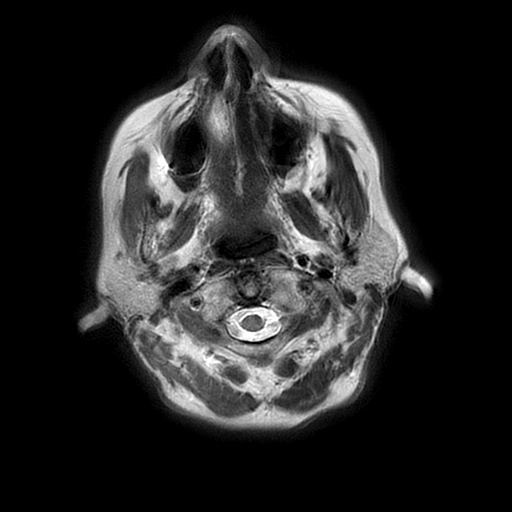
[im 29/29]
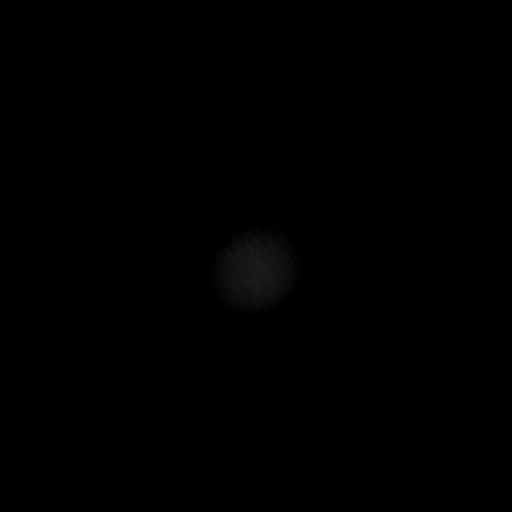

[Series 9: FLAIR · axial · 3.0mm · 0.45mm/px · z∈[-67,+98]mm · 2 of 29 slices shown (2 of 2)]
[im 1/29]
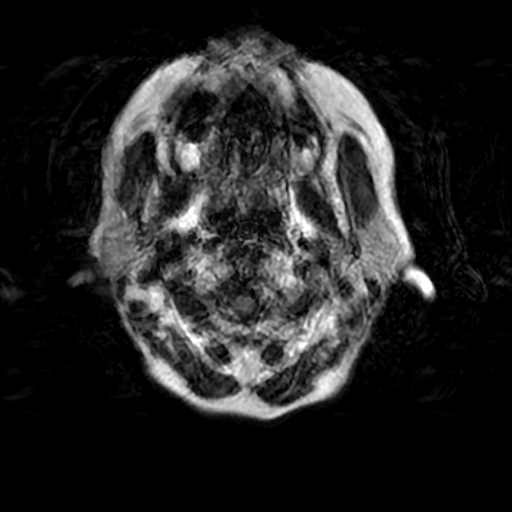
[im 29/29]
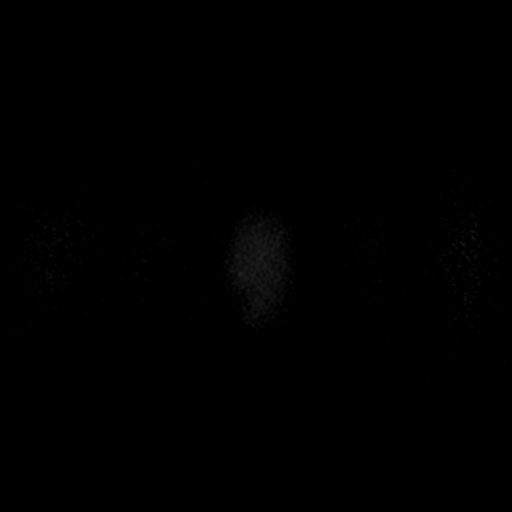

[Series 11: (person_name) · axial · 3.0mm · 0.47mm/px · z∈[-57,+34]mm · 5 of 108 slices shown]
[im 1/108]
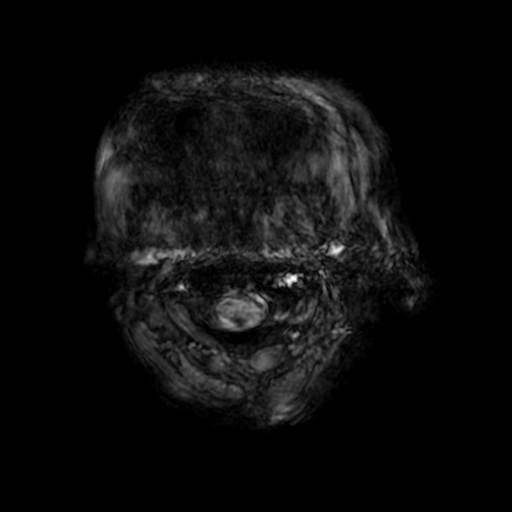
[im 16/108]
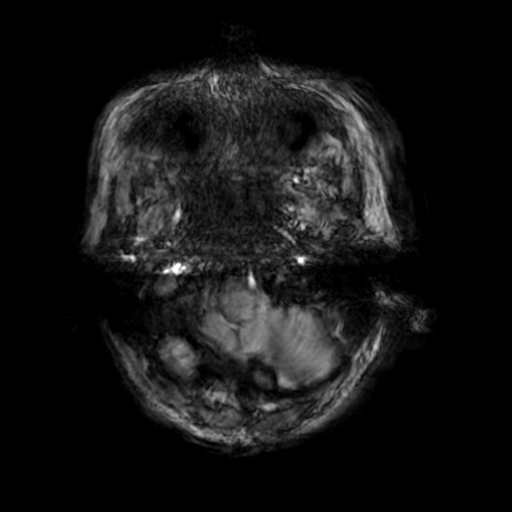
[im 31/108]
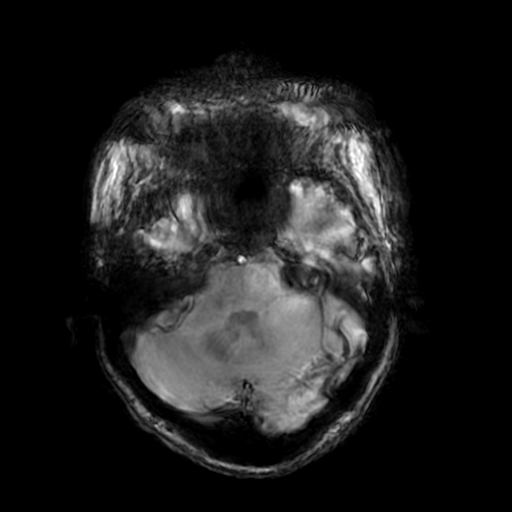
[im 46/108]
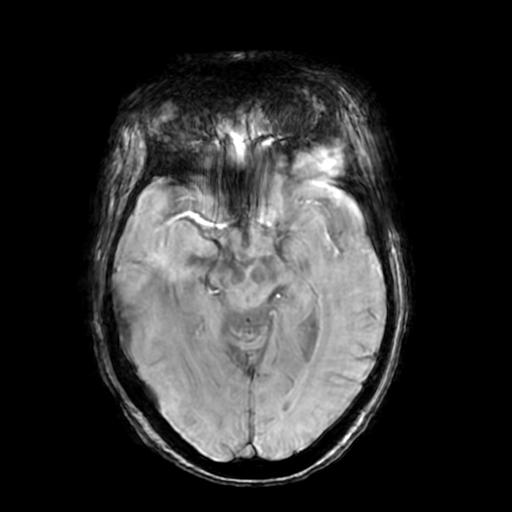
[im 62/108]
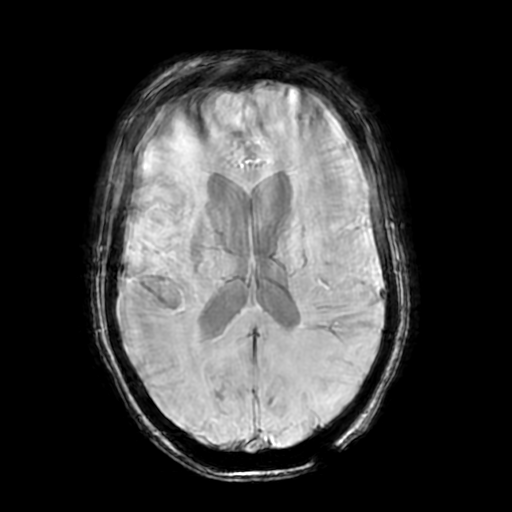

[Series 13: T2 · coronal · 5.0mm · 0.39mm/px · 2 of 33 slices shown (2 of 2)]
[im 1/33]
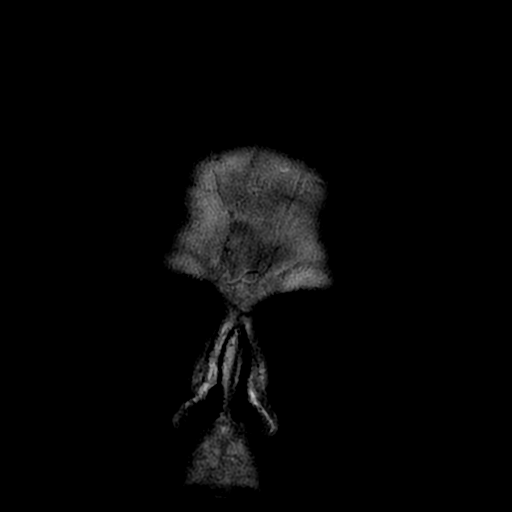
[im 33/33]
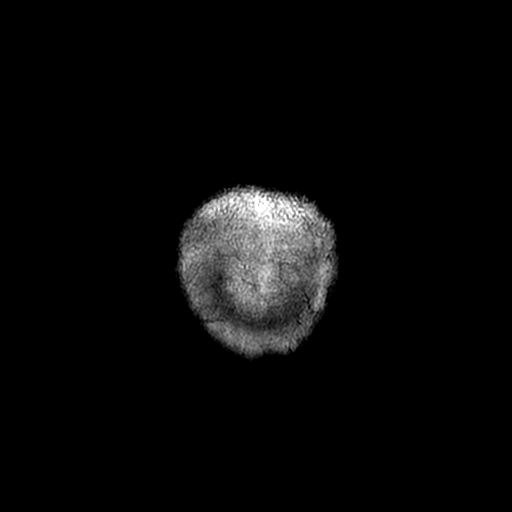

[Series 350: ADC · axial · 3.0mm · 0.94mm/px · z∈[-68,+95]mm · 4 of 56 slices shown (1 of 2)]
[im 1/56]
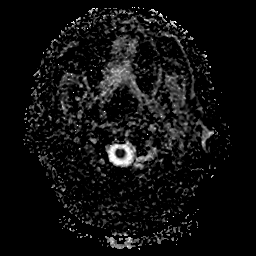
[im 19/56]
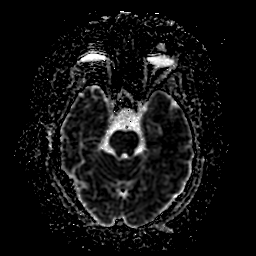
[im 37/56]
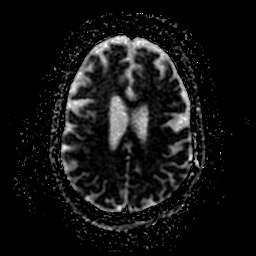
[im 56/56]
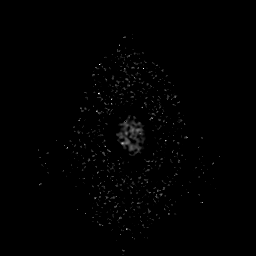

[Series 450: ADC · coronal · 4.0mm · 0.94mm/px · 3 of 39 slices shown (2 of 2)]
[im 1/39]
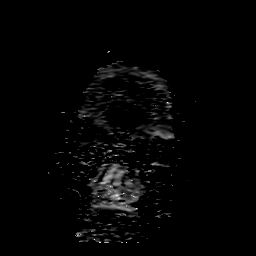
[im 20/39]
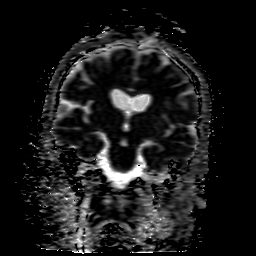
[im 39/39]
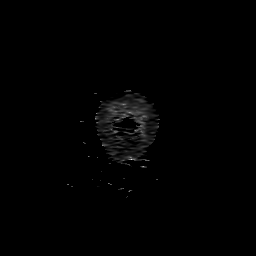

[33 of 48 positions shown; findings below may reference images not displayed]

FINDINGS: Multiple sequences are mildly to moderately motion degraded.

Brain: There is no evidence of acute infarct, intracranial
hemorrhage, mass, midline shift, or extra-axial fluid collection.
There is mild cerebral atrophy. Scattered cerebral white matter T2
hyperintensities are unchanged from the prior MRI and advanced for
age. There is normal signal in the brainstem.

Vascular: Major intracranial vascular flow voids are preserved.

Skull and upper cervical spine: Unremarkable bone marrow signal.

Sinuses/Orbits: Unremarkable orbits. Paranasal sinuses and mastoid
air cells are clear.

Other: None.
IMPRESSION: 1. No acute intracranial abnormality.
2. Mildly age advanced cerebral white matter disease, unchanged from
9861 and nonspecific. Considerations include chronic small vessel
ischemia, vasculitis, migraines, prior infection/inflammation, and
demyelinating disease.

## 2019-02-07 ENCOUNTER — Other Ambulatory Visit: Payer: Self-pay | Admitting: Family Medicine

## 2019-02-19 ENCOUNTER — Encounter: Payer: Self-pay | Admitting: Family Medicine

## 2019-02-19 ENCOUNTER — Ambulatory Visit (INDEPENDENT_AMBULATORY_CARE_PROVIDER_SITE_OTHER): Payer: Medicare Other | Admitting: Family Medicine

## 2019-02-19 ENCOUNTER — Other Ambulatory Visit: Payer: Self-pay

## 2019-02-19 VITALS — BP 112/69 | HR 68 | Temp 98.0°F | Resp 16 | Ht 67.5 in | Wt 181.8 lb

## 2019-02-19 DIAGNOSIS — R7303 Prediabetes: Secondary | ICD-10-CM | POA: Diagnosis not present

## 2019-02-19 DIAGNOSIS — Z0000 Encounter for general adult medical examination without abnormal findings: Secondary | ICD-10-CM

## 2019-02-19 DIAGNOSIS — E663 Overweight: Secondary | ICD-10-CM

## 2019-02-19 DIAGNOSIS — D649 Anemia, unspecified: Secondary | ICD-10-CM | POA: Diagnosis not present

## 2019-02-19 DIAGNOSIS — I10 Essential (primary) hypertension: Secondary | ICD-10-CM | POA: Diagnosis not present

## 2019-02-19 DIAGNOSIS — E7800 Pure hypercholesterolemia, unspecified: Secondary | ICD-10-CM

## 2019-02-19 LAB — COMPREHENSIVE METABOLIC PANEL
ALT: 19 U/L (ref 0–35)
AST: 16 U/L (ref 0–37)
Albumin: 4.6 g/dL (ref 3.5–5.2)
Alkaline Phosphatase: 46 U/L (ref 39–117)
BUN: 12 mg/dL (ref 6–23)
CO2: 29 mEq/L (ref 19–32)
Calcium: 9.5 mg/dL (ref 8.4–10.5)
Chloride: 101 mEq/L (ref 96–112)
Creatinine, Ser: 0.86 mg/dL (ref 0.40–1.20)
GFR: 65.99 mL/min (ref >60.00)
Glucose, Bld: 89 mg/dL (ref 70–99)
Potassium: 4.2 mEq/L (ref 3.5–5.1)
Sodium: 137 mEq/L (ref 135–145)
Total Bilirubin: 0.8 mg/dL (ref 0.2–1.2)
Total Protein: 6.3 g/dL (ref 6.0–8.3)

## 2019-02-19 LAB — LIPID PANEL
Cholesterol: 185 mg/dL (ref 0–200)
HDL: 57.9 mg/dL (ref >39.00)
LDL Cholesterol: 107 mg/dL — ABNORMAL HIGH (ref 0–99)
NonHDL: 127
Total CHOL/HDL Ratio: 3
Triglycerides: 99 mg/dL (ref 0.0–149.0)
VLDL: 19.8 mg/dL (ref 0.0–40.0)

## 2019-02-19 LAB — CBC WITH DIFFERENTIAL/PLATELET
Basophils Absolute: 0 10*3/uL (ref 0.0–0.1)
Basophils Relative: 0.5 % (ref 0.0–3.0)
Eosinophils Absolute: 0.1 10*3/uL (ref 0.0–0.7)
Eosinophils Relative: 2.5 % (ref 0.0–5.0)
HCT: 31.9 % — ABNORMAL LOW (ref 36.0–46.0)
Hemoglobin: 11 g/dL — ABNORMAL LOW (ref 12.0–15.0)
Lymphocytes Relative: 35.4 % (ref 12.0–46.0)
Lymphs Abs: 1.8 10*3/uL (ref 0.7–4.0)
MCHC: 34.4 g/dL (ref 30.0–36.0)
MCV: 91.6 fl (ref 78.0–100.0)
Monocytes Absolute: 0.5 10*3/uL (ref 0.1–1.0)
Monocytes Relative: 9.1 % (ref 3.0–12.0)
Neutro Abs: 2.7 10*3/uL (ref 1.4–7.7)
Neutrophils Relative %: 52.5 % (ref 43.0–77.0)
Platelets: 226 10*3/uL (ref 150.0–400.0)
RBC: 3.48 Mil/uL — ABNORMAL LOW (ref 3.87–5.11)
RDW: 13.8 % (ref 11.5–15.5)
WBC: 5.2 10*3/uL (ref 4.0–10.5)

## 2019-02-19 LAB — TSH: TSH: 1.55 u[IU]/mL (ref 0.35–4.50)

## 2019-02-19 LAB — HEMOGLOBIN A1C: Hgb A1c MFr Bld: 5.7 % (ref 4.6–6.5)

## 2019-02-19 NOTE — Patient Instructions (Signed)
Health Maintenance, Female Adopting a healthy lifestyle and getting preventive care are important in promoting health and wellness. Ask your health care provider about:  The right schedule for you to have regular tests and exams.  Things you can do on your own to prevent diseases and keep yourself healthy. What should I know about diet, weight, and exercise? Eat a healthy diet   Eat a diet that includes plenty of vegetables, fruits, low-fat dairy products, and lean protein.  Do not eat a lot of foods that are high in solid fats, added sugars, or sodium. Maintain a healthy weight Body mass index (BMI) is used to identify weight problems. It estimates body fat based on height and weight. Your health care provider can help determine your BMI and help you achieve or maintain a healthy weight. Get regular exercise Get regular exercise. This is one of the most important things you can do for your health. Most adults should:  Exercise for at least 150 minutes each week. The exercise should increase your heart rate and make you sweat (moderate-intensity exercise).  Do strengthening exercises at least twice a week. This is in addition to the moderate-intensity exercise.  Spend less time sitting. Even light physical activity can be beneficial. Watch cholesterol and blood lipids Have your blood tested for lipids and cholesterol at 66 years of age, then have this test every 5 years. Have your cholesterol levels checked more often if:  Your lipid or cholesterol levels are high.  You are older than 66 years of age.  You are at high risk for heart disease. What should I know about cancer screening? Depending on your health history and family history, you may need to have cancer screening at various ages. This may include screening for:  Breast cancer.  Cervical cancer.  Colorectal cancer.  Skin cancer.  Lung cancer. What should I know about heart disease, diabetes, and high blood  pressure? Blood pressure and heart disease  High blood pressure causes heart disease and increases the risk of stroke. This is more likely to develop in people who have high blood pressure readings, are of African descent, or are overweight.  Have your blood pressure checked: ? Every 3-5 years if you are 18-39 years of age. ? Every year if you are 40 years old or older. Diabetes Have regular diabetes screenings. This checks your fasting blood sugar level. Have the screening done:  Once every three years after age 40 if you are at a normal weight and have a low risk for diabetes.  More often and at a younger age if you are overweight or have a high risk for diabetes. What should I know about preventing infection? Hepatitis B If you have a higher risk for hepatitis B, you should be screened for this virus. Talk with your health care provider to find out if you are at risk for hepatitis B infection. Hepatitis C Testing is recommended for:  Everyone born from 1945 through 1965.  Anyone with known risk factors for hepatitis C. Sexually transmitted infections (STIs)  Get screened for STIs, including gonorrhea and chlamydia, if: ? You are sexually active and are younger than 66 years of age. ? You are older than 66 years of age and your health care provider tells you that you are at risk for this type of infection. ? Your sexual activity has changed since you were last screened, and you are at increased risk for chlamydia or gonorrhea. Ask your health care provider if   you are at risk.  Ask your health care provider about whether you are at high risk for HIV. Your health care provider may recommend a prescription medicine to help prevent HIV infection. If you choose to take medicine to prevent HIV, you should first get tested for HIV. You should then be tested every 3 months for as long as you are taking the medicine. Pregnancy  If you are about to stop having your period (premenopausal) and  you may become pregnant, seek counseling before you get pregnant.  Take 400 to 800 micrograms (mcg) of folic acid every day if you become pregnant.  Ask for birth control (contraception) if you want to prevent pregnancy. Osteoporosis and menopause Osteoporosis is a disease in which the bones lose minerals and strength with aging. This can result in bone fractures. If you are 65 years old or older, or if you are at risk for osteoporosis and fractures, ask your health care provider if you should:  Be screened for bone loss.  Take a calcium or vitamin D supplement to lower your risk of fractures.  Be given hormone replacement therapy (HRT) to treat symptoms of menopause. Follow these instructions at home: Lifestyle  Do not use any products that contain nicotine or tobacco, such as cigarettes, e-cigarettes, and chewing tobacco. If you need help quitting, ask your health care provider.  Do not use street drugs.  Do not share needles.  Ask your health care provider for help if you need support or information about quitting drugs. Alcohol use  Do not drink alcohol if: ? Your health care provider tells you not to drink. ? You are pregnant, may be pregnant, or are planning to become pregnant.  If you drink alcohol: ? Limit how much you use to 0-1 drink a day. ? Limit intake if you are breastfeeding.  Be aware of how much alcohol is in your drink. In the [place] one drink equals one 12 oz bottle of beer (355 mL), one 5 oz glass of wine (148 mL), or one 1 oz glass of hard liquor (44 mL). General instructions  Schedule regular health, dental, and eye exams.  Stay current with your vaccines.  Tell your health care provider if: ? You often feel depressed. ? You have ever been abused or do not feel safe at home. Summary  Adopting a healthy lifestyle and getting preventive care are important in promoting health and wellness.  Follow your health care provider's instructions about healthy  diet, exercising, and getting tested or screened for diseases.  Follow your health care provider's instructions on monitoring your cholesterol and blood pressure. This information is not intended to replace advice given to you by your health care provider. Make sure you discuss any questions you have with your health care provider. Document Released: 10/04/2010 Document Revised: 03/14/2018 Document Reviewed: 03/14/2018 Elsevier Patient Education  2020 Elsevier Inc.  
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
## 2019-02-20 ENCOUNTER — Encounter: Payer: Self-pay | Admitting: Family Medicine

## 2019-02-21 ENCOUNTER — Telehealth: Payer: Self-pay | Admitting: Family Medicine

## 2019-02-21 ENCOUNTER — Other Ambulatory Visit: Payer: Self-pay

## 2019-02-21 DIAGNOSIS — E7800 Pure hypercholesterolemia, unspecified: Secondary | ICD-10-CM

## 2019-02-21 NOTE — Telephone Encounter (Signed)
Requested documents have been printed, will place up front to be mailed.

## 2019-02-21 NOTE — Telephone Encounter (Signed)
Pt called in wanting a copy of her flu shot and AVS yearly physical for insurance purpose. Pt would it to be mailed if possible. Please and thank you! Address is current.

## 2019-03-05 DIAGNOSIS — K760 Fatty (change of) liver, not elsewhere classified: Secondary | ICD-10-CM

## 2019-03-05 HISTORY — DX: Fatty (change of) liver, not elsewhere classified: K76.0

## 2019-03-06 ENCOUNTER — Ambulatory Visit: Payer: Medicare Other | Admitting: Cardiology

## 2019-03-12 ENCOUNTER — Encounter: Payer: Self-pay | Admitting: Family Medicine

## 2019-03-12 ENCOUNTER — Other Ambulatory Visit: Payer: Self-pay

## 2019-03-12 ENCOUNTER — Ambulatory Visit (INDEPENDENT_AMBULATORY_CARE_PROVIDER_SITE_OTHER): Payer: Medicare Other | Admitting: Family Medicine

## 2019-03-12 VITALS — BP 126/93 | HR 83 | Temp 98.3°F | Resp 16 | Ht 67.5 in | Wt 181.0 lb

## 2019-03-12 DIAGNOSIS — R0781 Pleurodynia: Secondary | ICD-10-CM | POA: Diagnosis not present

## 2019-03-12 DIAGNOSIS — K8020 Calculus of gallbladder without cholecystitis without obstruction: Secondary | ICD-10-CM

## 2019-03-12 DIAGNOSIS — R0789 Other chest pain: Secondary | ICD-10-CM | POA: Diagnosis not present

## 2019-03-12 NOTE — Progress Notes (Signed)
OFFICE VISIT  03/12/2019   CC:  Chief Complaint  Patient presents with  . Abdominal Pain    upper right   HPI:    Patient is a 68 y.o. Caucasian female who presents for discomfort in upper abdomen. Has remote hx of asymptomatic gallstones. Onset of pain in R side at the level of inferior ribs a few months ago. Hurts worse with taking deep breaths. Also hurts worse after eating too much. Not related to any specific type of food.   Eating in general leads to no problems UNLESS she overeats. No rash, no burning or tingling in the area of pain.  Says there is some sensitivity to touch. No hx of trauma to the area, no strain recalled by pt. No meds tried.  No nausea.  NSAIDS->none  ROS: no exertional CP, no fevers, no SOB, no wheezing, no cough, no dizziness, no HAs, no rashes, no melena/hematochezia.  No polyuria or polydipsia.  No myalgias or arthralgias.  +Has some classic GERD sx's fairly regularly.   Past Medical History:  Diagnosis Date  . Alcoholism in remission [facility])    states last alcohol 11 yrs ago  . Anxiety   . Asymptomatic gallstones   . Bipolar 1 disorder ([place])    Admission for mania x 2 (most recent 11/2017)  . Cervical spondylosis without myelopathy 12/19/2013   MRI 02/2014: multilevel DDD/spondylosis, not much change compared to prior MRI.  Pt not in favor of invasive therapy for her neck as of 04/2014.  Marland Kitchen COPD (chronic obstructive pulmonary disease) (HCC)    Moderate: anoro started 04/2017 by pulm, pt symptomatically improved and PFTs stable at f/u 06/2017--pulm f/u planned 1 yr.  . Coronary artery spasm ([place]) 07/11/2018   nitrates=HA. Amlodipine=intol swelling.  Changed to coreg 09/03/18 by cardiologist.  . Dilutional hyponatremia   . Endometritis 05/2017   possible; empiric tx with Flagyl by GYN.  . Environmental allergies   . Fibromyalgia   . GERD (gastroesophageal reflux disease)   . Herpes zoster 08/07/2017   L scapular region  . Hiatal hernia   . History  of adenomatous polyp of colon 04/2008; 08/2014   No high grade dysplasia: recall 5 yrs ([name], [name])  . History of basal cell carcinoma excision    NOSE  . History of Helicobacter pylori infection 04/2008   +gastric biopsy (gastritis but no metaplasia, dysplasia, or malignancy identified)  . History of TIA (transient ischemic attack)    secondary to HELLP syndrome 1994 post c/s--  residual memory loss  . Hyperlipidemia    statin started after her NSTEMI: goal  LDL <70.  Marland Kitchen Hypertension   . Internal hemorrhoids   . Ischemic cardiomyopathy 05/2018   Echo EF 45-50% in context of NSTEMI from CA spasm.  . 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 04/2017.  . Memory loss    Abnl MRI brain and CT brain c/w chronic microvascular ischemia.  Repeat MRI brain 02/2014 stable (Dr. Mayra Reel with no plan to f/u with neuro as of 04/2014)  . NSTEMI (non-ST elevated myocardial infarction) ([place]) 05/2018   Echo EF 45-50% in context of NSTEMI from CA spasm.  . Osteoporosis 05/2010   2012 'penia; 06/2015 'porosis:  prolia.  DEXA 09/2017 T-score -2.2 femur neck.  As of 11/2018 we're restarting prolia soon and will check next DEXA 1 yr  from her next prolia injection.  . Pelvic floor dysfunction    [facility] ([name])  . Postmenopausal vaginal bleeding 05/2017   x 1 small episode; GYN attempted endo bx but unable to penetrate cervix due to severe cerv stenosis (due to postmenopausal state + hx of LEEP).  Endo u/s showed thin endo lining.  Per GYN---no evidence of endomet pathology on w/u---obs as of 07/2017.  Marland Kitchen Prediabetes 06/2016   Fasting gluc 105; HbA1c at that time was 6.0%.  A1c 6.2% 12/2017.  Marland Kitchen Recurrent kidney stones 08/2013   Left ureteral calculus: Perc nephr--cystoscopy w/ureteroscopy + laser for stone removal.   Residual asymptomatic left renal nephrolithiasis <84mm present post-procedure.  Right sided hydronephrosis-persistent (on u/s)--urol ordered CT to further eval 08/2016: 1.6 cm nonobstructing stone lower pole left kidney, no hydro, plan for PCN extraction ([facility])  . Shortness of breath   . Sprain of neck 12/19/2013  . TMJ (temporomandibular joint disorder)    USES  MOUTH GUARD  . Toe fracture, left 12/2017   2nd toe prox phalanx (immobilization, [name])  . Vitamin D deficiency 05/2011  . Weak urinary stream 07/2016   with elevated PVR and mild left hydronephrosis secondary to this ([facility] started flomax 0.4mg  qd for this).    Past Surgical History:  Procedure Laterality Date  . ANTERIOR CERVICAL DECOMP/DISCECTOMY FUSION  06-30-2009   C3 -- C5  AND EXPLORATION OF FUSION C5-7 W/  PLATE REMOVAL  . BUNIONECTOMY Left   . CERVICAL FUSION  2002   anterior C5 -- C7  . CERVICAL SPINE SURGERY  08-27-1999     C6 -- T1  LAMINECTOMY/  DISKECTOMY  . CESAREAN SECTION  1994  . COLONOSCOPY W/ POLYPECTOMY  04/2008;08/2014   2016 tubular adenoma x 1; +diverticulosis and int/ext hemorrhoids.  Recall 5 yrs  . CYSTOSCOPY WITH URETEROSCOPY AND STENT PLACEMENT Left 08/28/2013   Procedure: CYSTOSCOPY, RGP,  WITH URETEROSCOPY AND STENT REMOVAL;  Surgeon: [name], MD;  Location: [facility];  Service: Urology;  Laterality: Left;  . DEXA  09/2017   T-score -2.2 femur neck: improved compared to 06/2015.  Marland Kitchen ESOPHAGOGASTRODUODENOSCOPY  2010; 08/2014   +Candidal esophagitis; Mild chronic gastritis w/intestinal metaplasia--NEG H pylori, neg for eosinophilic esoph  . HOLMIUM LASER APPLICATION Left [id number]   Procedure: HOLMIUM LASER APPLICATION;  Surgeon: [name], MD;  Location[facility];  Service: Urology;  Laterality: Left;  . LEFT HEART CATH AND CORONARY ANGIOGRAPHY N/A 05/15/2018   Evidence for spasm in distal LAD.  Mod RCA atherosclerosis, o/w no CAD.   Procedure: LEFT HEART CATH AND CORONARY ANGIOGRAPHY;  Surgeon: Leonie Man, MD;  Location: [facility];  Service: Cardiovascular;  Laterality: N/A;  . NEGATIVE SLEEP STUDY  04-01-2007  . NEPHROLITHOTOMY Left 08/05/2013   Procedure: NEPHROLITHOTOMY PERCUTANEOUS;  Surgeon: [name], MD;  Location: [facility];  Service: Urology;  Laterality: Left;  . POLYSOMNOGRAM  01/2019   NORMAL  . POLYSOMNOGRAPHY  10/25/2018   Dr. Dion Saucier due to pt inadequat sleep time (83 min).  . TONSILLECTOMY AND ADENOIDECTOMY  1975  . TOTAL KNEE ARTHROPLASTY Right 2003  . TRANSTHORACIC ECHOCARDIOGRAM  05/2018   (after MI secondary to arterial spasm): EF 45-50%; severe hypokinesis of apical anterolateral, inferolateral , anterior, and inferior LV myocardium.  Marland Kitchen ULTRASOUND EXAM,PELVIC COMPLETE ([facility] HX)  06/25/15   NORMAL (done by GYN)    Outpatient Medications Prior to Visit  Medication Sig Dispense Refill  .  Acetaminophen 500 MG coapsule Take 650 mg by mouth every 6 (six) hours as needed for mild pain or headache.     . ALPRAZolam (XANAX) 1 MG tablet TAKE 1 TABLET (1 MG TOTAL) BY MOUTH 3 (THREE) TIMES DAILY. 270 tablet 1  . ANORO ELLIPTA 62.5-25 MCG/INH AEPB INHALE 1 PUFF BY MOUTH EVERY DAY 60 each 5  . aspirin EC 81 MG EC tablet Take 1 tablet (81 mg total) by mouth daily. 90 tablet 3  . atorvastatin (LIPITOR) 80 MG tablet Take 1 tablet (80 mg total) by mouth daily at 6 PM. 90 tablet 3  . carvedilol (COREG) 25 MG tablet Take 1 tablet (25 mg total) by mouth 2 (two) times daily with a meal. 180 tablet 3  . citalopram (CELEXA) 20 MG tablet TAKE 1 TABLET BY MOUTH TWICE A DAY 180 tablet 2  . cyclobenzaprine (FLEXERIL) 10 MG tablet Take 1 tablet (10 mg total) by mouth 2 (two) times daily. 60 tablet 6  . doxycycline (VIBRAMYCIN) 50 MG capsule Take 50 mg by mouth 2 (two) times daily.   3  . famotidine (PEPCID) 40 MG tablet Take 1 tablet (40 mg total) by mouth daily. 30 tablet 11  . fluticasone  (FLONASE) 50 MCG/ACT nasal spray SPRAY 2 SPRAYS INTO EACH NOSTRIL EVERY DAY 48 mL 2  . hydrochlorothiazide (HYDRODIURIL) 25 MG tablet Take 1 tablet (25 mg total) by mouth daily. 90 tablet 1  . lamoTRIgine (LAMICTAL) 100 MG tablet TAKE 3 TABLETS (300 MG TOTAL) BY MOUTH AT BEDTIME. 270 tablet 0  . Magnesium 250 MG TABS Take 1 tablet by mouth daily.    . montelukast (SINGULAIR) 10 MG tablet TAKE 1 TABLET (10 MG TOTAL) BY MOUTH EVERY EVENING. 90 tablet 4  . Multiple Vitamin (MULTI-VITAMINS) TABS Take 1 tablet by mouth daily.     . nitroGLYCERIN (NITROSTAT) 0.4 MG SL tablet Place 1 tablet (0.4 mg total) under the tongue every 5 (five) minutes x 3 doses as needed for chest pain. 25 tablet 3  . polyethylene glycol powder (GLYCOLAX/MIRALAX) powder TAKE 17 G BY MOUTH DAILY. 3162 g 0  . potassium citrate (UROCIT-K) 10 MEQ (1080 MG) SR tablet Take 10 mEq by mouth 2 (two) times a day.     Marland Kitchen Propylene Glycol (SYSTANE COMPLETE) 0.6 % SOLN Apply 1 drop to eye 2 (two) times daily.    Marland Kitchen triamcinolone cream (KENALOG) 0.1 % Apply 1 application topically daily.    . TURMERIC PO Take 1 tablet by mouth 2 (two) times a day.    . zolpidem (AMBIEN) 10 MG tablet TAKE 1 TABLET BY MOUTH EVERYDAY AT BEDTIME 90 tablet 1  . losartan (COZAAR) 50 MG tablet Take 1 tablet (50 mg total) by mouth daily. (Patient taking differently: Take 50 mg by mouth daily. Pt is taking 2 tablets daily.) 90 tablet 3   No facility-administered medications prior to visit.     Allergies  Allergen Reactions  . Ceftin [Cefuroxime Axetil] Anaphylaxis    Swelling of eyes and tongue  . Ciprofloxacin Anaphylaxis    Difficulty breathing and generalized swelling  . Fosamax [Alendronate Sodium] Diarrhea  . Morphine And Related Other (See Comments)    Hallucinations/ disoriented - pt stated, "Only Morphine" - 03/23/17  . Prednisone Other (See Comments)    Hyperactivity  . Seroquel [Quetiapine] Other (See Comments)    oversedation    ROS As per  HPI  PE: Blood pressure (!) 126/93, pulse 83, temperature 98.3 F (36.8 C), temperature  source Temporal, resp. rate 16, height 5' 7.5" (1.715 m), weight 181 lb (82.1 kg), SpO2 93 %. Exam chaperoned by Deveron Furlong, CMA. Gen: Alert, well appearing.  Patient is oriented to person, place, time, and situation. AFFECT: pleasant, lucid thought and speech. CV: RRR, no m/r/g.   LUNGS: CTA bilat, nonlabored resps, good aeration in all lung fields. R posterior (in line with scapula), right side, and R anterior chest wall tenderness diffusely.  No focal palpable bony or soft tissue nodule. No rash or erythema. This tenderness is over approx the lower 3 ribs. Abd: soft, NT, no organomegaly, no distention.  BS normal.  LABS:    Chemistry      Component Value Date/Time   NA 137 02/19/2019 1144   NA 142 09/25/2018 1007   K 4.2 02/19/2019 1144   CL 101 02/19/2019 1144   CO2 29 02/19/2019 1144   BUN 12 02/19/2019 1144   BUN 11 09/25/2018 1007   CREATININE 0.86 02/19/2019 1144   CREATININE 1.27 (H) 05/16/2016 1525      Component Value Date/Time   CALCIUM 9.5 02/19/2019 1144   ALKPHOS 46 02/19/2019 1144   AST 16 02/19/2019 1144   ALT 19 02/19/2019 1144   BILITOT 0.8 02/19/2019 1144   BILITOT 0.7 09/25/2018 1007     Lab Results  Component Value Date   CHOL 185 02/19/2019   HDL 57.90 02/19/2019   LDLCALC 107 (H) 02/19/2019   LDLDIRECT 144.1 05/29/2013   TRIG 99.0 02/19/2019   CHOLHDL 3 02/19/2019   Lab Results  Component Value Date   WBC 5.2 02/19/2019   HGB 11.0 (L) 02/19/2019   HCT 31.9 (L) 02/19/2019   MCV 91.6 02/19/2019   PLT 226.0 02/19/2019   Lab Results  Component Value Date   IRON 84 04/29/2014   FERRITIN 27.6 04/29/2014   Lab Results  Component Value Date   [id number] 12/11/2017   Lab Results  Component Value Date   HGBA1C 5.7 02/19/2019    IMPRESSION AND PLAN:  1) Right sided chest wall pain: unknown etiology. ?Transient inflammation.  No sign of  infection or fracture. Sx's are certainly tolerable, so we'll observe and see how this goes over the next couple of months. In the meantime I will check plain films of R sided ribs. Also, given her hx of asymptomatic gallstones (cannot find record in chart to determine WHEN this dx was found), we discussed the typical sx's and pattern of sx's that would indicate symptomatic gallstones. Will check limited abd u/s to confirm that gallstones still present.   No suspicion of acute cholecystitis, so no labs indicated today. Signs/symptoms to call or return for were reviewed and pt expressed understanding.  An After Visit Summary was printed and given to the patient.  FOLLOW UP: Return if symptoms worsen or fail to improve.  Signed:  Crissie Sickles, MD           03/12/2019

## 2019-03-15 ENCOUNTER — Other Ambulatory Visit: Payer: Self-pay

## 2019-03-15 ENCOUNTER — Ambulatory Visit (HOSPITAL_BASED_OUTPATIENT_CLINIC_OR_DEPARTMENT_OTHER)
Admission: RE | Admit: 2019-03-15 | Discharge: 2019-03-15 | Disposition: A | Discharge status: Home / Self Care | Payer: Medicare Other | Source: Ambulatory Visit | Attending: Family Medicine | Admitting: Family Medicine

## 2019-03-15 DIAGNOSIS — K8020 Calculus of gallbladder without cholecystitis without obstruction: Secondary | ICD-10-CM | POA: Diagnosis not present

## 2019-03-15 DIAGNOSIS — R0781 Pleurodynia: Secondary | ICD-10-CM | POA: Insufficient documentation

## 2019-03-18 ENCOUNTER — Encounter: Payer: Self-pay | Admitting: Family Medicine

## 2019-03-19 ENCOUNTER — Telehealth: Payer: Self-pay | Admitting: Family Medicine

## 2019-03-19 NOTE — Telephone Encounter (Signed)
Patient aware of results.

## 2019-03-19 NOTE — Telephone Encounter (Signed)
Patient calling about xray/ultrasound results she received in her mychart.  Patient can be reached at [phone].  Thank you

## 2019-04-08 ENCOUNTER — Other Ambulatory Visit: Payer: Self-pay | Admitting: Family Medicine

## 2019-04-10 ENCOUNTER — Other Ambulatory Visit: Payer: Self-pay | Admitting: Pulmonary Disease

## 2019-04-19 ENCOUNTER — Other Ambulatory Visit: Payer: Self-pay

## 2019-04-19 ENCOUNTER — Ambulatory Visit (INDEPENDENT_AMBULATORY_CARE_PROVIDER_SITE_OTHER): Payer: Medicare Other | Admitting: Pulmonary Disease

## 2019-04-19 ENCOUNTER — Encounter: Payer: Self-pay | Admitting: Pulmonary Disease

## 2019-04-19 ENCOUNTER — Ambulatory Visit (INDEPENDENT_AMBULATORY_CARE_PROVIDER_SITE_OTHER): Payer: Medicare Other

## 2019-04-19 VITALS — BP 114/74 | HR 66 | Temp 98.1°F | Ht 67.5 in | Wt 181.6 lb

## 2019-04-19 DIAGNOSIS — Z0000 Encounter for general adult medical examination without abnormal findings: Secondary | ICD-10-CM | POA: Diagnosis not present

## 2019-04-19 DIAGNOSIS — J449 Chronic obstructive pulmonary disease, unspecified: Secondary | ICD-10-CM

## 2019-04-19 DIAGNOSIS — R0602 Shortness of breath: Secondary | ICD-10-CM | POA: Diagnosis not present

## 2019-04-19 LAB — COMPREHENSIVE METABOLIC PANEL
ALT: 18 U/L (ref 0–35)
AST: 19 U/L (ref 0–37)
Albumin: 4.7 g/dL (ref 3.5–5.2)
Alkaline Phosphatase: 53 U/L (ref 39–117)
BUN: 11 mg/dL (ref 6–23)
CO2: 29 mEq/L (ref 19–32)
Calcium: 9.4 mg/dL (ref 8.4–10.5)
Chloride: 96 mEq/L (ref 96–112)
Creatinine, Ser: 0.91 mg/dL (ref 0.40–1.20)
GFR: 61.79 mL/min (ref >60.00)
Glucose, Bld: 86 mg/dL (ref 70–99)
Potassium: 3.7 mEq/L (ref 3.5–5.1)
Sodium: 134 mEq/L — ABNORMAL LOW (ref 135–145)
Total Bilirubin: 0.7 mg/dL (ref 0.2–1.2)
Total Protein: 6.7 g/dL (ref 6.0–8.3)

## 2019-04-19 LAB — CBC
HCT: 33.1 % — ABNORMAL LOW (ref 36.0–46.0)
Hemoglobin: 11.3 g/dL — ABNORMAL LOW (ref 12.0–15.0)
MCHC: 34.1 g/dL (ref 30.0–36.0)
MCV: 91.8 fl (ref 78.0–100.0)
Platelets: 252 10*3/uL (ref 150.0–400.0)
RBC: 3.61 Mil/uL — ABNORMAL LOW (ref 3.87–5.11)
RDW: 13.2 % (ref 11.5–15.5)
WBC: 5.9 10*3/uL (ref 4.0–10.5)

## 2019-04-19 MED ORDER — AZITHROMYCIN 250 MG PO TABS: ORAL_TABLET | ORAL | 0 refills | Status: DC

## 2019-04-19 NOTE — Assessment & Plan Note (Signed)
Plan: Patient due for pneumonia vaccine later on this year after May/2021 Provided information to patient regarding Covid vaccine

## 2019-04-19 NOTE — Addendum Note (Signed)
Addended by: Suzzanne Cloud E on: 04/19/2019 12:30 PM   Modules accepted: Orders

## 2019-04-19 NOTE — Assessment & Plan Note (Signed)
Plan: Continue Anoro Ellipta Lab work today Chest x-ray today Patient needs Pneumovax later on this year 6 to 8-week follow-up with Dr. Vaughan Browner

## 2019-04-19 NOTE — Progress Notes (Signed)
Due to patient's allergies will hold off on AugmentinPersonally reached out to the patient and patient reports that he can tolerate azithromycin.  We will treat with Z-PakAzithromycin 250mg  tablet  >>>Take 2 tablets (500mg  total) today, and then 1 tablet (250mg ) for the next four days  >>>take with food  >>>can also take probiotic and / or yogurt while on antibiotic    Wyn Quaker FNP

## 2019-04-19 NOTE — Progress Notes (Signed)
@Patient  ID: Erin Lopez, female    DOB: 05-14-1950, 69 y.o.   MRN: [id number]  Chief Complaint  Patient presents with  . Follow-up    Acute visit-Increased O2 and SOB for the past 6 months. Became worse in the last 2 months. Has had a cough on and off. Has gained at least 20lbs.     Referring provider: Tammi Sou, MD  HPI:  69 year old female former smoker followed in our office for COPD  PMH: Hypertension, hyperlipidemia, bipolar Smoker/ Smoking History: Former smoker.  Quit 1995.  33-pack-year smoking history.   Maintenance: Anoro Ellipta Pt of: Dr. Vaughan Browner  04/19/2019  - Visit   69 year old female former smoker followed in our office for COPD.  She was last seen by Dr. Vaughan Browner in March/2019.  Patient reporting today for an acute visit.  She reports she had increased shortness of breath over 6 months.  She is had to use her rescue inhaler 1-2 times a day.  She is noticed that this has been increased.  She continues to report adherence to her Anoro Ellipta.  Patient reports that she is having a cough with clear mucus.  She denies wheezing.  She admits she has been physically inactive over the last year.  She also admits weight increases.  She notices that her lower extremities are slightly more swollen than usual.  Questionaires / Pulmonary Flowsheets:   MMRC: mMRC Dyspnea Scale mMRC Score  04/19/2019 2    Epworth:  No flowsheet data found.  Tests:   CT abd 08/06/13.- Linear bilateral lower lobe atelectasis or scarring noted. Areas of mild bronchiectasis are noted.  Chest x-ray 04/25/17- mild basilar atelectasis, minimal blunting of the costophrenic angle.  Barium swallow 04/18/08 Minimal hiatal hernia. GE reflux.  PFTs 06/16/15 FVC 3.01 (85%), FEV1 1.65 (60%), F/F 55 Moderate obstructive defect.  PFT 06/12/17 FVC 2.83 [79%], FEV1 1.69 [60%], F/F 60, TLC 122%, RV/TLC 140%, DLCO 43% Moderate obstructive defect, severe diffusion impairment.  FENO:  No results  found for: NITRICOXIDE  PFT: PFT Results Latest Ref Rng & Units 06/12/2017  FVC-Pre L 2.87  FVC-Predicted Pre % 80  FVC-Post L 2.83  FVC-Predicted Post % 79  Pre FEV1/FVC % % 54  Post FEV1/FCV % % 60  FEV1-Pre L 1.56  FEV1-Predicted Pre % 57  FEV1-Post L 1.69  DLCO UNC% % 43  DLCO COR %Predicted % 49  TLC L 6.74  TLC % Predicted % 122  RV % Predicted % 173    WALK:  No flowsheet data found.  Imaging: No results found.  Lab Results:  CBC    Component Value Date/Time   WBC 5.2 02/19/2019 1144   RBC 3.48 (L) 02/19/2019 1144   HGB 11.0 (L) 02/19/2019 1144   HCT 31.9 (L) 02/19/2019 1144   PLT 226.0 02/19/2019 1144   MCV 91.6 02/19/2019 1144   MCH 30.6 05/30/2018 1410   MCHC 34.4 02/19/2019 1144   RDW 13.8 02/19/2019 1144   LYMPHSABS 1.8 02/19/2019 1144   MONOABS 0.5 02/19/2019 1144   EOSABS 0.1 02/19/2019 1144   BASOSABS 0.0 02/19/2019 1144    BMET    Component Value Date/Time   NA 137 02/19/2019 1144   NA 142 09/25/2018 1007   K 4.2 02/19/2019 1144   CL 101 02/19/2019 1144   CO2 29 02/19/2019 1144   GLUCOSE 89 02/19/2019 1144   BUN 12 02/19/2019 1144   BUN 11 09/25/2018 1007   CREATININE 0.86 02/19/2019 1144  CREATININE 1.27 (H) 05/16/2016 1525   CALCIUM 9.5 02/19/2019 1144   GFRNONAA 66 09/25/2018 1007   GFRAA 77 09/25/2018 1007    BNP No results found for: BNP  ProBNP    Component Value Date/Time   PROBNP 70.3 05/15/2013 2021    Specialty Problems      Pulmonary Problems   COPD (chronic obstructive pulmonary disease) (HCC)   Shortness of breath      Allergies  Allergen Reactions  . Ceftin [Cefuroxime Axetil] Anaphylaxis    Swelling of eyes and tongue  . Ciprofloxacin Anaphylaxis    Difficulty breathing and generalized swelling  . Fosamax [Alendronate Sodium] Diarrhea  . Morphine And Related Other (See Comments)    Hallucinations/ disoriented - pt stated, "Only Morphine" - 03/23/17  . Prednisone Other (See Comments)     Hyperactivity  . Seroquel [Quetiapine] Other (See Comments)    oversedation    Immunization History  Administered Date(s) Administered  . Influenza Split 01/03/2015  . Influenza, High Dose Seasonal PF 02/01/2018, 01/23/2019  . Influenza,inj,Quad PF,6+ Mos 12/21/2016  . Influenza-Unspecified 01/17/2014, 01/03/2016  . Pneumococcal Conjugate-13 03/30/2018  . Pneumococcal Polysaccharide-23 02/26/2008, 08/07/2014  . Td 04/05/2003  . Tdap 08/07/2014  . Zoster Recombinat (Shingrix) 05/28/2018, 07/17/2018    Past Medical History:  Diagnosis Date  . Alcoholism in remission [facility])    states last alcohol 11 yrs ago  . Anxiety   . Asymptomatic gallstones    u/s 03/2019 (noted on imaging prior to that though)  . Bipolar 1 disorder ([place])    Admission for mania x 2 (most recent 11/2017)  . Cervical spondylosis without myelopathy 12/19/2013   MRI 02/2014: multilevel DDD/spondylosis, not much change compared to prior MRI.  Pt not in favor of invasive therapy for her neck as of 04/2014.  Marland Kitchen COPD (chronic obstructive pulmonary disease) (HCC)    Moderate: anoro started 04/2017 by pulm, pt symptomatically improved and PFTs stable at f/u 06/2017--pulm f/u planned 1 yr.  . Coronary artery spasm ([place]) 07/11/2018   nitrates=HA. Amlodipine=intol swelling.  Changed to coreg 09/03/18 by cardiologist.  . Dilutional hyponatremia   . Endometritis 05/2017   possible; empiric tx with Flagyl by GYN.  . Environmental allergies   . Fibromyalgia   . GERD (gastroesophageal reflux disease)   . Hepatic steatosis 03/2019   u/s abd  . Herpes zoster 08/07/2017   L scapular region  . Hiatal hernia   . History of adenomatous polyp of colon 04/2008; 08/2014   No high grade dysplasia: recall 5 yrs ([name], [name])  . History of basal cell carcinoma excision    NOSE  . History of Helicobacter pylori infection 04/2008   +gastric biopsy (gastritis but no metaplasia, dysplasia, or malignancy identified)  . History of TIA  (transient ischemic attack)    secondary to HELLP syndrome 1994 post c/s--  residual memory loss  . Hyperlipidemia    statin started after her NSTEMI: goal  LDL <70.  Marland Kitchen Hypertension   . Internal hemorrhoids   . Ischemic cardiomyopathy 05/2018   Echo EF 45-50% in context of NSTEMI from CA spasm.  . 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 04/2017.  . Memory loss    Abnl MRI brain and CT brain c/w chronic microvascular ischemia.  Repeat MRI brain 02/2014 stable (Dr. Mayra Reel with no plan to f/u with  neuro as of 04/2014)  . NSTEMI (non-ST elevated myocardial infarction) ([place]) 05/2018   Echo EF 45-50% in context of NSTEMI from CA spasm.  . Osteoporosis 05/2010   2012 'penia; 06/2015 'porosis:  prolia.  DEXA 09/2017 T-score -2.2 femur neck.  As of 11/2018 we're restarting prolia soon and will check next DEXA 1 yr from her next prolia injection.  . Pelvic floor dysfunction    [facility] ([name])  . Postmenopausal vaginal bleeding 05/2017   x 1 small episode; GYN attempted endo bx but unable to penetrate cervix due to severe cerv stenosis (due to postmenopausal state + hx of LEEP).  Endo u/s showed thin endo lining.  Per GYN---no evidence of endomet pathology on w/u---obs as of 07/2017.  Marland Kitchen Prediabetes 06/2016   Fasting gluc 105; HbA1c at that time was 6.0%.  A1c 6.2% 12/2017.  Marland Kitchen Recurrent kidney stones 08/2013   Left ureteral calculus: Perc nephr--cystoscopy w/ureteroscopy + laser for stone removal.  Residual asymptomatic left renal nephrolithiasis <29mm present post-procedure.  Right sided hydronephrosis-persistent (on u/s)--urol ordered CT to further eval 08/2016: 1.6 cm nonobstructing stone lower pole left kidney, no hydro, plan for PCN extraction ([facility])  . Shortness of breath   . Sprain of neck 12/19/2013  .  TMJ (temporomandibular joint disorder)    USES  MOUTH GUARD  . Toe fracture, left 12/2017   2nd toe prox phalanx (immobilization, [name])  . Vitamin D deficiency 05/2011  . Weak urinary stream 07/2016   with elevated PVR and mild left hydronephrosis secondary to this ([facility] started flomax 0.4mg  qd for this).    Tobacco History: Social History   Tobacco Use  Smoking Status Former Smoker  . Packs/day: 1.00  . Years: 33.00  . Pack years: 33.00  . Types: Cigarettes  . Quit date: 08/22/1993  . Years since quitting: 25.6  Smokeless Tobacco Never Used   Counseling given: No   Continue to not smoke  Outpatient Encounter Medications as of 04/19/2019  Medication Sig  . Acetaminophen 500 MG coapsule Take 650 mg by mouth every 6 (six) hours as needed for mild pain or headache.   . ALPRAZolam (XANAX) 1 MG tablet TAKE 1 TABLET (1 MG TOTAL) BY MOUTH 3 (THREE) TIMES DAILY.  Marland Kitchen ANORO ELLIPTA 62.5-25 MCG/INH AEPB INHALE 1 PUFF BY MOUTH EVERY DAY  . aspirin EC 81 MG EC tablet Take 1 tablet (81 mg total) by mouth daily.  Marland Kitchen atorvastatin (LIPITOR) 80 MG tablet Take 1 tablet (80 mg total) by mouth daily at 6 PM.  . carvedilol (COREG) 25 MG tablet Take 1 tablet (25 mg total) by mouth 2 (two) times daily with a meal.  . citalopram (CELEXA) 20 MG tablet TAKE 1 TABLET BY MOUTH TWICE A DAY  . cyclobenzaprine (FLEXERIL) 10 MG tablet Take 1 tablet (10 mg total) by mouth 2 (two) times daily.  Marland Kitchen doxycycline (VIBRAMYCIN) 50 MG capsule Take 50 mg by mouth 2 (two) times daily.   . famotidine (PEPCID) 40 MG tablet Take 1 tablet (40 mg total) by mouth daily.  . fluticasone (FLONASE) 50 MCG/ACT nasal spray SPRAY 2 SPRAYS INTO EACH NOSTRIL EVERY DAY  . hydrochlorothiazide (HYDRODIURIL) 25 MG tablet Take 1 tablet (25 mg total) by mouth daily.  Marland Kitchen lamoTRIgine (LAMICTAL) 100 MG tablet TAKE 3 TABLETS (300 MG TOTAL) BY MOUTH AT BEDTIME.  . Magnesium 250 MG TABS Take 1 tablet by mouth daily.  . montelukast  (SINGULAIR) 10 MG tablet TAKE 1 TABLET (10  MG TOTAL) BY MOUTH EVERY EVENING.  . Multiple Vitamin (MULTI-VITAMINS) TABS Take 1 tablet by mouth daily.   . nitroGLYCERIN (NITROSTAT) 0.4 MG SL tablet Place 1 tablet (0.4 mg total) under the tongue every 5 (five) minutes x 3 doses as needed for chest pain.  . polyethylene glycol powder (GLYCOLAX/MIRALAX) powder TAKE 17 G BY MOUTH DAILY.  Marland Kitchen potassium citrate (UROCIT-K) 10 MEQ (1080 MG) SR tablet Take 10 mEq by mouth 2 (two) times a day.   Marland Kitchen Propylene Glycol (SYSTANE COMPLETE) 0.6 % SOLN Apply 1 drop to eye 2 (two) times daily.  Marland Kitchen triamcinolone cream (KENALOG) 0.1 % Apply 1 application topically daily.  . TURMERIC PO Take 1 tablet by mouth 2 (two) times a day.  . zolpidem (AMBIEN) 10 MG tablet TAKE 1 TABLET BY MOUTH EVERYDAY AT BEDTIME  . losartan (COZAAR) 50 MG tablet Take 1 tablet (50 mg total) by mouth daily. (Patient taking differently: Take 50 mg by mouth daily. Pt is taking 2 tablets daily.)   No facility-administered encounter medications on file as of 04/19/2019.     Review of Systems  Review of Systems  Constitutional: Positive for fatigue. Negative for activity change and fever.  HENT: Negative for sinus pressure, sinus pain and sore throat.   Respiratory: Positive for shortness of breath. Negative for cough and wheezing.   Cardiovascular: Negative for chest pain and palpitations.  Gastrointestinal: Negative for diarrhea, nausea and vomiting.  Musculoskeletal: Negative for arthralgias.  Neurological: Negative for dizziness.  Psychiatric/Behavioral: Negative for sleep disturbance. The patient is not nervous/anxious.      Physical Exam  BP 114/74 (BP Location: Left Arm, Patient Position: Sitting, Cuff Size: Normal)   Pulse 66   Temp 98.1 F (36.7 C) (Temporal)   Ht 5' 7.5" (1.715 m)   Wt 181 lb 9.6 oz (82.4 kg)   SpO2 95%   BMI 28.02 kg/m   Wt Readings from Last 5 Encounters:  04/19/19 181 lb 9.6 oz (82.4 kg)  03/12/19 181 lb  (82.1 kg)  02/19/19 181 lb 12.8 oz (82.5 kg)  11/07/18 177 lb 3.2 oz (80.4 kg)  10/25/18 170 lb (77.1 kg)    BMI Readings from Last 5 Encounters:  04/19/19 28.02 kg/m  03/12/19 27.93 kg/m  02/19/19 28.05 kg/m  11/07/18 27.34 kg/m  10/25/18 26.63 kg/m     Physical Exam Vitals and nursing note reviewed.  Constitutional:      General: She is not in acute distress.    Appearance: Normal appearance. She is normal weight.  HENT:     Head: Normocephalic and atraumatic.     Right Ear: Tympanic membrane, ear canal and external ear normal. There is no impacted cerumen.     Left Ear: Tympanic membrane, ear canal and external ear normal. There is no impacted cerumen.     Nose: Nose normal. No congestion or rhinorrhea.     Mouth/Throat:     Mouth: Mucous membranes are moist.     Pharynx: Oropharynx is clear.  Eyes:     Pupils: Pupils are equal, round, and reactive to light.  Cardiovascular:     Rate and Rhythm: Normal rate and regular rhythm.     Pulses: Normal pulses.     Heart sounds: Normal heart sounds. No murmur.  Pulmonary:     Effort: Pulmonary effort is normal. No respiratory distress.     Breath sounds: No decreased air movement. No decreased breath sounds, wheezing or rales.  Musculoskeletal:     Cervical  back: Normal range of motion.  Skin:    General: Skin is warm and dry.     Capillary Refill: Capillary refill takes less than 2 seconds.  Neurological:     General: No focal deficit present.     Mental Status: She is alert and oriented to person, place, and time. Mental status is at baseline.     Gait: Gait normal.  Psychiatric:        Mood and Affect: Mood is anxious.        Behavior: Behavior normal.        Thought Content: Thought content normal.        Judgment: Judgment normal.     Comments: Patient reports difficulty with memory at times.  She reports that is difficult for her to remember to follow-up with things.       Assessment & Plan:   COPD  (chronic obstructive pulmonary disease) ([place]) Plan: Continue Anoro Ellipta Lab work today Chest x-ray today Patient needs Pneumovax later on this year 6 to 8-week follow-up with Dr. Vaughan Browner  Shortness of breath Suspect that likely patient's shortness of breath that is chronic and directly related to sedentary lifestyle and weight increases.  We will obtain baseline lab work as well as chest x-ray imaging today to further evaluate.  Lung sounds are clear with no audible wheeze on exam today.  Plan: Lab work today Chest x-ray today Follow-up with our office in 6 to 8 weeks  Healthcare maintenance Plan: Patient due for pneumonia vaccine later on this year after May/2021 Provided information to patient regarding Covid vaccine    Return in about 2 months (around 06/17/2019), or if symptoms worsen or fail to improve, for Follow up with Dr. Vaughan Browner.   Lauraine Rinne, NP 04/19/2019   This appointment required 32 minutes of patient care (this includes precharting, chart review, review of results, face-to-face care, etc.).

## 2019-04-19 NOTE — Assessment & Plan Note (Signed)
Suspect that likely patient's shortness of breath that is chronic and directly related to sedentary lifestyle and weight increases.  We will obtain baseline lab work as well as chest x-ray imaging today to further evaluate.  Lung sounds are clear with no audible wheeze on exam today.  Plan: Lab work today Chest x-ray today Follow-up with our office in 6 to 8 weeks

## 2019-04-19 NOTE — Patient Instructions (Addendum)
You were seen today by Lauraine Rinne, NP  for:   1. Chronic obstructive pulmonary disease, unspecified COPD type (HCC)  Anoro Ellipta  >>> Take 1 puff daily in the morning right when you wake up >>>Rinse your mouth out after use >>>This is a daily maintenance inhaler, NOT a rescue inhaler >>>Contact our office if you are having difficulties affording or obtaining this medication >>>It is important for you to be able to take this daily and not miss any doses   Note your daily symptoms > remember "red flags" for COPD:   >>>Increase in cough >>>increase in sputum production >>>increase in shortness of breath or activity  intolerance.   If you notice these symptoms, please call the office to be seen.   2. Shortness of breath  - DG Chest 2 View; Future - Pro b natriuretic peptide (BNP) - Alphonsa Brickle ProBNP; Future - Comp Met (CMET); Future - CBC; Future  3. Healthcare maintenance  COVID-19 Vaccine Information can be found at: ShippingScam.co.uk For questions related to vaccine distribution or appointments, please email [email or web address] or call [phone].   You are due for a pneumonia vaccine in May/2021   We recommend today:  Orders Placed This Encounter  Procedures  . DG Chest 2 View    Standing Status:   Future    Standing Expiration Date:   06/16/2020    Order Specific Question:   Reason for Exam (SYMPTOM  OR DIAGNOSIS REQUIRED)    Answer:   short of breath    Order Specific Question:   Preferred imaging location?    Answer:   Internal    Order Specific Question:   Radiology Contrast Protocol - do NOT remove file path    Answer:   [email or web address]  . Pro b natriuretic peptide (BNP) - Aaron Edelman ProBNP    Standing Status:   Future    Standing Expiration Date:   04/18/2020  . Comp Met (CMET)    Standing Status:   Future    Standing Expiration Date:   04/18/2020  . CBC   Standing Status:   Future    Standing Expiration Date:   04/18/2020   Orders Placed This Encounter  Procedures  . DG Chest 2 View  . Pro b natriuretic peptide (BNP) - Laurell Coalson ProBNP  . Comp Met (CMET)  . CBC   No orders of the defined types were placed in this encounter.   Follow Up:    Return in about 2 months (around 06/17/2019), or if symptoms worsen or fail to improve, for Follow up with Dr. Vaughan Browner.   Please do your part to reduce the spread of COVID-19:      Reduce your risk of any infection  and COVID19 by using the similar precautions used for avoiding the common cold or flu:  Marland Kitchen Wash your hands often with soap and warm water for at least 20 seconds.  If soap and water are not readily available, use an alcohol-based hand sanitizer with at least 60% alcohol.  . If coughing or sneezing, cover your mouth and nose by coughing or sneezing into the elbow areas of your shirt or coat, into a tissue or into your sleeve (not your hands). Langley Gauss A MASK when in public  . Avoid shaking hands with others and consider head nods or verbal greetings only. . Avoid touching your eyes, nose, or mouth with unwashed hands.  . Avoid close contact with people who are sick. . Avoid places or events with large numbers  of people in one location, like concerts or sporting events. . If you have some symptoms but not all symptoms, continue to monitor at home and seek medical attention if your symptoms worsen. . If you are having a medical emergency, call 911.   [place] / e-Visit: [email or web address]         [facility]: [facility]: [phone]                   [facility]: [phone]     It is flu season:   >>> Best ways to protect herself from the flu: Receive the yearly flu vaccine, practice good hand hygiene washing with soap and also  using hand sanitizer when available, eat a nutritious meals, get adequate rest, hydrate appropriately   Please contact the office if your symptoms worsen or you have concerns that you are not improving.   Thank you for choosing [facility] for your healthcare, and for allowing [place] to partner with you on your healthcare journey. I am thankful to be able to provide care to you today.   Wyn Quaker FNP-C    COPD and Physical Activity Chronic obstructive pulmonary disease (COPD) is a long-term (chronic) condition that affects the lungs. COPD is a general term that can be used to describe many different lung problems that cause lung swelling (inflammation) and limit airflow, including chronic bronchitis and emphysema. The main symptom of COPD is shortness of breath, which makes it harder to do even simple tasks. This can also make it harder to exercise and be active. Talk with your health care provider about treatments to help you breathe better and actions you can take to prevent breathing problems during physical activity. What are the benefits of exercising with COPD? Exercising regularly is an important part of a healthy lifestyle. You can still exercise and do physical activities even though you have COPD. Exercise and physical activity improve your shortness of breath by increasing blood flow (circulation). This causes your heart to pump more oxygen through your body. Moderate exercise can improve your:  Oxygen use.  Energy level.  Shortness of breath.  Strength in your breathing muscles.  Heart health.  Sleep.  Self-esteem and feelings of self-worth.  Depression, stress, and anxiety levels. Exercise can benefit everyone with COPD. The severity of your disease may affect how hard you can exercise, especially at first, but everyone can benefit. Talk with your health care provider about how much exercise is safe for you, and which activities and exercises are safe for  you. What actions can I take to prevent breathing problems during physical activity?  Sign up for a pulmonary rehabilitation program. This type of program may include: ? Education about lung diseases. ? Exercise classes that teach you how to exercise and be more active while improving your breathing. This usually involves:  Exercise using your lower extremities, such as a stationary bicycle.  About 30 minutes of exercise, 2 to 5 times per week, for 6 to 12 weeks  Strength training, such as push ups or leg lifts. ? Nutrition education. ? Group classes in which you can talk with others who also have COPD and learn ways to manage stress.  If you use an oxygen tank, you should use it while you exercise. Work with your health care provider to adjust your oxygen for your physical activity. Your resting flow rate is different from  your flow rate during physical activity.  While you are exercising: ? Take slow breaths. ? Pace yourself and do not try to go too fast. ? Purse your lips while breathing out. Pursing your lips is similar to a kissing or whistling position. ? If doing exercise that uses a quick burst of effort, such as weight lifting:  Breathe in before starting the exercise.  Breathe out during the hardest part of the exercise (such as raising the weights). Where to find support You can find support for exercising with COPD from:  Your health care provider.  A pulmonary rehabilitation program.  Your local health department or community health programs.  Support groups, online or in-person. Your health care provider may be able to recommend support groups. Where to find more information You can find more information about exercising with COPD from:  American Lung Association: ClassInsider.se.  COPD Foundation: https://www.rivera.net/. Contact a health care provider if:  Your symptoms get worse.  You have chest pain.  You have nausea.  You have a fever.  You have trouble  talking or catching your breath.  You want to start a new exercise program or a new activity. Summary  COPD is a general term that can be used to describe many different lung problems that cause lung swelling (inflammation) and limit airflow. This includes chronic bronchitis and emphysema.  Exercise and physical activity improve your shortness of breath by increasing blood flow (circulation). This causes your heart to provide more oxygen to your body.  Contact your health care provider before starting any exercise program or new activity. Ask your health care provider what exercises and activities are safe for you. This information is not intended to replace advice given to you by your health care provider. Make sure you discuss any questions you have with your health care provider. Document Revised: 07/11/2018 Document Reviewed: 04/13/2017 Elsevier Patient Education  2020 [employer].

## 2019-04-20 LAB — PRO B NATRIURETIC PEPTIDE: NT-Pro BNP: 149 pg/mL (ref 0–301)

## 2019-04-23 ENCOUNTER — Telehealth: Payer: Self-pay | Admitting: Pulmonary Disease

## 2019-04-23 NOTE — Telephone Encounter (Signed)
Erin Rinne, NP  04/21/2019 8:53 PM EST    Lab work stable.  Keep follow-up with our office. Work aggressively to increase day-to-day physical activity.  Please ensure patient has follow-up in 6 to 8 weeks with Dr. Vaughan Browner.  Wyn Quaker, FNP  ------------------------------------------------ [facility] with pt. She is aware of results. Recall has been placed for follow up. Nothing further was needed.

## 2019-04-25 ENCOUNTER — Other Ambulatory Visit: Payer: Self-pay | Admitting: Family Medicine

## 2019-04-25 NOTE — Telephone Encounter (Signed)
RF request for alprazolam. Last RX 10/03/2018 X 270 x 1 rf Last CPE 02/19/2019 Next OV 08/13/19  Okay to refill?

## 2019-04-29 ENCOUNTER — Encounter: Payer: Self-pay | Admitting: Family Medicine

## 2019-05-02 ENCOUNTER — Other Ambulatory Visit: Payer: Self-pay | Admitting: Cardiology

## 2019-05-05 ENCOUNTER — Other Ambulatory Visit: Payer: Self-pay | Admitting: Family Medicine

## 2019-05-08 DIAGNOSIS — D225 Melanocytic nevi of trunk: Secondary | ICD-10-CM | POA: Diagnosis not present

## 2019-05-08 DIAGNOSIS — L708 Other acne: Secondary | ICD-10-CM | POA: Diagnosis not present

## 2019-05-08 DIAGNOSIS — L820 Inflamed seborrheic keratosis: Secondary | ICD-10-CM | POA: Diagnosis not present

## 2019-05-08 DIAGNOSIS — L603 Nail dystrophy: Secondary | ICD-10-CM | POA: Diagnosis not present

## 2019-05-09 ENCOUNTER — Other Ambulatory Visit: Payer: Self-pay | Admitting: Pulmonary Disease

## 2019-05-16 ENCOUNTER — Other Ambulatory Visit: Payer: Self-pay | Admitting: Family Medicine

## 2019-05-18 ENCOUNTER — Other Ambulatory Visit: Payer: Self-pay | Admitting: Family Medicine

## 2019-05-24 ENCOUNTER — Ambulatory Visit: Payer: Medicare Other

## 2019-05-28 ENCOUNTER — Other Ambulatory Visit: Payer: Medicare Other

## 2019-05-29 ENCOUNTER — Other Ambulatory Visit: Payer: Self-pay

## 2019-05-29 ENCOUNTER — Other Ambulatory Visit (INDEPENDENT_AMBULATORY_CARE_PROVIDER_SITE_OTHER): Payer: Medicare Other

## 2019-05-29 DIAGNOSIS — E7800 Pure hypercholesterolemia, unspecified: Secondary | ICD-10-CM | POA: Diagnosis not present

## 2019-05-29 LAB — LIPID PANEL
Cholesterol: 168 mg/dL (ref 0–200)
HDL: 57.5 mg/dL (ref >39.00)
LDL Cholesterol: 88 mg/dL (ref 0–99)
NonHDL: 110.52
Total CHOL/HDL Ratio: 3
Triglycerides: 111 mg/dL (ref 0.0–149.0)
VLDL: 22.2 mg/dL (ref 0.0–40.0)

## 2019-06-04 NOTE — Progress Notes (Signed)
@Patient  ID: Erin Lopez, female    DOB: 01-Jan-1951, 69 y.o.   MRN: [id number]  Chief Complaint  Patient presents with  . Follow-up    4wk f/u. States she is still wheezing.     Referring provider: Tammi Sou, MD  HPI:  69 year old female former smoker followed in our office for COPD  PMH: Hypertension, hyperlipidemia, bipolar Smoker/ Smoking History: Former smoker.  Quit 1995.  33-pack-year smoking history.   Maintenance: Anoro Ellipta Pt of: Dr. Vaughan Browner  06/05/2019  - Visit     Questionaires / Pulmonary Flowsheets:   ACT:  No flowsheet data found.  MMRC: mMRC Dyspnea Scale mMRC Score  06/05/2019 1  04/19/2019 2    Epworth:  No flowsheet data found.  Tests:    CT abd 08/06/13.- Linear bilateral lower lobe atelectasis or scarring noted. Areas of mild bronchiectasis are noted.  Chest x-ray 04/25/17- mild basilar atelectasis, minimal blunting of the costophrenic angle.  Barium swallow 04/18/08 Minimal hiatal hernia. GE reflux.  PFTs 06/16/15 FVC 3.01 (85%), FEV1 1.65 (60%), F/F 55 Moderate obstructive defect.  PFT 06/12/17 FVC 2.83 [79%], FEV1 1.69 [60%], F/F 60, TLC 122%, RV/TLC 140%, DLCO 43% Moderate obstructive defect, severe diffusion impairment.   FENO:  No results found for: NITRICOXIDE  PFT: PFT Results Latest Ref Rng & Units 06/12/2017  FVC-Pre L 2.87  FVC-Predicted Pre % 80  FVC-Post L 2.83  FVC-Predicted Post % 79  Pre FEV1/FVC % % 54  Post FEV1/FCV % % 60  FEV1-Pre L 1.56  FEV1-Predicted Pre % 57  FEV1-Post L 1.69  DLCO UNC% % 43  DLCO COR %Predicted % 49  TLC L 6.74  TLC % Predicted % 122  RV % Predicted % 173    WALK:  No flowsheet data found.  Imaging: No results found.  Lab Results:  CBC    Component Value Date/Time   WBC 5.9 04/19/2019 1230   RBC 3.61 (L) 04/19/2019 1230   HGB 11.3 (L) 04/19/2019 1230   HCT 33.1 (L) 04/19/2019 1230   PLT 252.0 04/19/2019 1230   MCV 91.8 04/19/2019 1230   MCH 30.6  05/30/2018 1410   MCHC 34.1 04/19/2019 1230   RDW 13.2 04/19/2019 1230   LYMPHSABS 1.8 02/19/2019 1144   MONOABS 0.5 02/19/2019 1144   EOSABS 0.1 02/19/2019 1144   BASOSABS 0.0 02/19/2019 1144    BMET    Component Value Date/Time   NA 134 (L) 04/19/2019 1230   NA 142 09/25/2018 1007   K 3.7 04/19/2019 1230   CL 96 04/19/2019 1230   CO2 29 04/19/2019 1230   GLUCOSE 86 04/19/2019 1230   BUN 11 04/19/2019 1230   BUN 11 09/25/2018 1007   CREATININE 0.91 04/19/2019 1230   CREATININE 1.27 (H) 05/16/2016 1525   CALCIUM 9.4 04/19/2019 1230   GFRNONAA 66 09/25/2018 1007   GFRAA 77 09/25/2018 1007    BNP No results found for: BNP  ProBNP    Component Value Date/Time   PROBNP 149 04/19/2019 1230   PROBNP 70.3 05/15/2013 2021    Specialty Problems      Pulmonary Problems   COPD (chronic obstructive pulmonary disease) (HCC)   Shortness of breath      Allergies  Allergen Reactions  . Ceftin [Cefuroxime Axetil] Anaphylaxis    Swelling of eyes and tongue  . Ciprofloxacin Anaphylaxis    Difficulty breathing and generalized swelling  . Fosamax [Alendronate Sodium] Diarrhea  . Morphine And Related Other (See Comments)  Hallucinations/ disoriented - pt stated, "Only Morphine" - 03/23/17  . Prednisone Other (See Comments)    Hyperactivity  . Seroquel [Quetiapine] Other (See Comments)    oversedation    Immunization History  Administered Date(s) Administered  . Influenza Split 01/03/2015  . Influenza, High Dose Seasonal PF 02/01/2018, 01/23/2019  . Influenza,inj,Quad PF,6+ Mos 12/21/2016  . Influenza-Unspecified 01/17/2014, 01/03/2016  . Pneumococcal Conjugate-13 03/30/2018  . Pneumococcal Polysaccharide-23 02/26/2008, 08/07/2014  . Td 04/05/2003  . Tdap 08/07/2014  . Zoster Recombinat (Shingrix) 05/28/2018, 07/17/2018    Past Medical History:  Diagnosis Date  . Alcoholism in remission [facility])    states last alcohol 11 yrs ago  . Anxiety   . Asymptomatic  gallstones    u/s 03/2019 (noted on imaging prior to that though)  . Bipolar 1 disorder ([place])    Admission for mania x 2 (most recent 11/2017)  . Cervical spondylosis without myelopathy 12/19/2013   MRI 02/2014: multilevel DDD/spondylosis, not much change compared to prior MRI.  Pt not in favor of invasive therapy for her neck as of 04/2014.  Marland Kitchen COPD (chronic obstructive pulmonary disease) (HCC)    Moderate: anoro started 04/2017 by pulm, pt symptomatically improved and PFTs stable at f/u 06/2017.  Marland Kitchen Coronary artery spasm ([place]) 07/11/2018   nitrates=HA. Amlodipine=intol swelling.  Changed to coreg 09/03/18 by cardiologist.  . Dilutional hyponatremia   . Endometritis 05/2017   possible; empiric tx with Flagyl by GYN.  . Environmental allergies   . Fibromyalgia   . GERD (gastroesophageal reflux disease)   . Hepatic steatosis 03/2019   u/s abd  . Herpes zoster 08/07/2017   L scapular region  . Hiatal hernia   . History of adenomatous polyp of colon 04/2008; 08/2014   No high grade dysplasia: recall 5 yrs ([name], [name])  . History of basal cell carcinoma excision    NOSE  . History of Helicobacter pylori infection 04/2008   +gastric biopsy (gastritis but no metaplasia, dysplasia, or malignancy identified)  . History of TIA (transient ischemic attack)    secondary to HELLP syndrome 1994 post c/s--  residual memory loss  . Hyperlipidemia    statin started after her NSTEMI: goal  LDL <70.  Marland Kitchen Hypertension   . Internal hemorrhoids   . Ischemic cardiomyopathy 05/2018   Echo EF 45-50% in context of NSTEMI from CA spasm.  . 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 04/2017.  . Memory loss    Abnl MRI brain and CT brain c/w chronic microvascular ischemia.  Repeat MRI brain 02/2014 stable (Dr. Mayra Reel with no  plan to f/u with neuro as of 04/2014)  . NSTEMI (non-ST elevated myocardial infarction) ([place]) 05/2018   Echo EF 45-50% in context of NSTEMI from CA spasm.  . Osteoporosis 05/2010   2012 'penia; 06/2015 'porosis:  prolia.  DEXA 09/2017 T-score -2.2 femur neck.  As of 11/2018 we're restarting prolia soon and will check next DEXA 1 yr from her next prolia injection.  . Pelvic floor dysfunction    [facility] ([name])  . Postmenopausal vaginal bleeding 05/2017   x 1 small episode; GYN attempted endo bx but unable to penetrate cervix due to severe cerv stenosis (due to postmenopausal state + hx of LEEP).  Endo u/s showed thin endo lining.  Per GYN---no evidence of endomet pathology on w/u---obs as  of 07/2017.  Marland Kitchen Prediabetes 06/2016   Fasting gluc 105; HbA1c at that time was 6.0%.  A1c 6.2% 12/2017.  Marland Kitchen Recurrent kidney stones 08/2013   Left ureteral calculus: Perc nephr--cystoscopy w/ureteroscopy + laser for stone removal.  Residual asymptomatic left renal nephrolithiasis <23mm present post-procedure.  Right sided hydronephrosis-persistent (on u/s)--urol ordered CT to further eval 08/2016: 1.6 cm nonobstructing stone lower pole left kidney, no hydro, plan for PCN extraction ([facility])  . Shortness of breath   . Sprain of neck 12/19/2013  . TMJ (temporomandibular joint disorder)    USES  MOUTH GUARD  . Toe fracture, left 12/2017   2nd toe prox phalanx (immobilization, [name])  . Vitamin D deficiency 05/2011  . Weak urinary stream 07/2016   with elevated PVR and mild left hydronephrosis secondary to this ([facility] started flomax 0.4mg  qd for this).    Tobacco History: Social History   Tobacco Use  Smoking Status Former Smoker  . Packs/day: 1.00  . Years: 33.00  . Pack years: 33.00  . Types: Cigarettes  . Quit date: 08/22/1993  . Years since quitting: 25.8  Smokeless Tobacco Never Used   Counseling given: Not Answered   Continue to not smoke  Outpatient Encounter Medications as  of 06/05/2019  Medication Sig  . Acetaminophen 500 MG coapsule Take 650 mg by mouth every 6 (six) hours as needed for mild pain or headache.   . ALPRAZolam (XANAX) 1 MG tablet TAKE 1 TABLET (1 MG TOTAL) BY MOUTH 3 (THREE) TIMES DAILY.  Marland Kitchen ANORO ELLIPTA 62.5-25 MCG/INH AEPB INHALE 1 PUFF BY MOUTH EVERY DAY  . aspirin EC 81 MG EC tablet Take 1 tablet (81 mg total) by mouth daily.  Marland Kitchen atorvastatin (LIPITOR) 80 MG tablet Take 1 tablet (80 mg total) by mouth daily at 6 PM.  . carvedilol (COREG) 25 MG tablet TAKE 1 TABLET (25 MG TOTAL) BY MOUTH 2 (TWO) TIMES DAILY WITH A MEAL.  . citalopram (CELEXA) 20 MG tablet TAKE 1 TABLET BY MOUTH TWICE A DAY  . cyclobenzaprine (FLEXERIL) 10 MG tablet Take 1 tablet (10 mg total) by mouth 2 (two) times daily.  Marland Kitchen doxycycline (VIBRAMYCIN) 50 MG capsule Take 50 mg by mouth daily.   . famotidine (PEPCID) 40 MG tablet Take 1 tablet (40 mg total) by mouth daily.  . fluticasone (FLONASE) 50 MCG/ACT nasal spray SPRAY 2 SPRAYS INTO EACH NOSTRIL EVERY DAY  . hydrochlorothiazide (HYDRODIURIL) 25 MG tablet TAKE 1 TABLET BY MOUTH EVERY DAY  . lamoTRIgine (LAMICTAL) 100 MG tablet TAKE 3 TABLETS (300 MG TOTAL) BY MOUTH AT BEDTIME.  . Magnesium 250 MG TABS Take 1 tablet by mouth daily.  . montelukast (SINGULAIR) 10 MG tablet TAKE 1 TABLET BY MOUTH EVERY DAY IN THE EVENING  . Multiple Vitamin (MULTI-VITAMINS) TABS Take 1 tablet by mouth daily.   . nitroGLYCERIN (NITROSTAT) 0.4 MG SL tablet Place 1 tablet (0.4 mg total) under the tongue every 5 (five) minutes x 3 doses as needed for chest pain.  . polyethylene glycol powder (GLYCOLAX/MIRALAX) powder TAKE 17 G BY MOUTH DAILY.  Marland Kitchen potassium citrate (UROCIT-K) 10 MEQ (1080 MG) SR tablet Take 10 mEq by mouth 2 (two) times a day.   Marland Kitchen Propylene Glycol (SYSTANE COMPLETE) 0.6 % SOLN Apply 1 drop to eye 2 (two) times daily.  Marland Kitchen triamcinolone cream (KENALOG) 0.1 % Apply 1 application topically daily.  . TURMERIC PO Take 1 tablet by mouth 2 (two)  times a day.  . zolpidem (AMBIEN)  10 MG tablet TAKE 1 TABLET BY MOUTH EVERYDAY AT BEDTIME  . [DISCONTINUED] azithromycin (ZITHROMAX) 250 MG tablet Take 2 tablets on first day, then 1 tablet daily until finished.  . losartan (COZAAR) 50 MG tablet Take 1 tablet (50 mg total) by mouth daily. (Patient taking differently: Take 50 mg by mouth daily. Pt is taking 2 tablets daily.)   No facility-administered encounter medications on file as of 06/05/2019.     Review of Systems  Review of Systems  Constitutional: Positive for fatigue. Negative for activity change and fever.  HENT: Negative for sinus pressure, sinus pain and sore throat.   Respiratory: Positive for shortness of breath. Negative for cough and wheezing.   Cardiovascular: Negative for chest pain and palpitations.  Gastrointestinal: Negative for diarrhea, nausea and vomiting.  Musculoskeletal: Negative for arthralgias.  Neurological: Negative for dizziness.  Psychiatric/Behavioral: Positive for dysphoric mood. Negative for sleep disturbance. The patient is not nervous/anxious.      Physical Exam  BP 122/76 (BP Location: Left Arm, Patient Position: Sitting, Cuff Size: Normal)   Pulse 68   Temp 98.4 F (36.9 C) (Temporal)   Ht 5' 7.5" (1.715 m)   Wt 174 lb (78.9 kg)   SpO2 96% Comment: on RA  BMI 26.85 kg/m   Wt Readings from Last 5 Encounters:  06/05/19 174 lb (78.9 kg)  04/19/19 181 lb 9.6 oz (82.4 kg)  03/12/19 181 lb (82.1 kg)  02/19/19 181 lb 12.8 oz (82.5 kg)  11/07/18 177 lb 3.2 oz (80.4 kg)    BMI Readings from Last 5 Encounters:  06/05/19 26.85 kg/m  04/19/19 28.02 kg/m  03/12/19 27.93 kg/m  02/19/19 28.05 kg/m  11/07/18 27.34 kg/m     Physical Exam Vitals and nursing note reviewed.  Constitutional:      General: She is not in acute distress.    Appearance: Normal appearance. She is obese.  HENT:     Head: Normocephalic and atraumatic.     Right Ear: Tympanic membrane, ear canal and external ear  normal. There is no impacted cerumen.     Left Ear: Tympanic membrane, ear canal and external ear normal. There is no impacted cerumen.     Nose: Nose normal. No congestion or rhinorrhea.     Mouth/Throat:     Mouth: Mucous membranes are moist.     Pharynx: Oropharynx is clear.  Eyes:     Pupils: Pupils are equal, round, and reactive to light.  Cardiovascular:     Rate and Rhythm: Normal rate and regular rhythm.     Pulses: Normal pulses.     Heart sounds: Normal heart sounds. No murmur.  Pulmonary:     Effort: Pulmonary effort is normal. No respiratory distress.     Breath sounds: Normal breath sounds. No decreased air movement. No decreased breath sounds, wheezing or rales.  Musculoskeletal:     Cervical back: Normal range of motion.     Right lower leg: No edema.     Left lower leg: No edema.  Skin:    General: Skin is warm and dry.     Capillary Refill: Capillary refill takes less than 2 seconds.  Neurological:     General: No focal deficit present.     Mental Status: She is alert and oriented to person, place, and time. Mental status is at baseline.     Gait: Gait normal.  Psychiatric:        Mood and Affect: Mood is depressed. Affect is flat.  Behavior: Behavior normal.        Thought Content: Thought content normal.        Judgment: Judgment normal.       Assessment & Plan:   COPD (chronic obstructive pulmonary disease) (HCC) Plan: Continue Anoro Ellipta Continue to increase physical activity Chest x-ray today to compare to January/2021 54-month follow-up with Dr. Vaughan Browner  Abnormal chest x-ray Plan: We will get a chest x-ray today to compare to January/2021  Shortness of breath Lab work in January/2021 stable Chest x-ray in January/2021 showed potential opacity, patient treated with Z-Pak Still believe the patient shortness of breath is likely directly related to sedentary lifestyle and weight increases.  Plan: We will continue to monitor the patient  clinically Chest x-ray today Continue Anoro Ellipta Follow-up with our office in 3 months Work to increase physical activity    Return in about 3 months (around 09/05/2019), or if symptoms worsen or fail to improve, for Follow up with Dr. Vaughan Browner.   Lauraine Rinne, NP 06/05/2019   This appointment required 24 minutes of patient care (this includes precharting, chart review, review of results, face-to-face care, etc.).

## 2019-06-05 ENCOUNTER — Other Ambulatory Visit: Payer: Self-pay

## 2019-06-05 ENCOUNTER — Ambulatory Visit (INDEPENDENT_AMBULATORY_CARE_PROVIDER_SITE_OTHER): Payer: Medicare Other | Admitting: Pulmonary Disease

## 2019-06-05 ENCOUNTER — Ambulatory Visit (INDEPENDENT_AMBULATORY_CARE_PROVIDER_SITE_OTHER): Payer: Medicare Other

## 2019-06-05 ENCOUNTER — Encounter: Payer: Self-pay | Admitting: Pulmonary Disease

## 2019-06-05 VITALS — BP 122/76 | HR 68 | Temp 98.4°F | Ht 67.5 in | Wt 174.0 lb

## 2019-06-05 DIAGNOSIS — I700 Atherosclerosis of aorta: Secondary | ICD-10-CM | POA: Diagnosis not present

## 2019-06-05 DIAGNOSIS — R918 Other nonspecific abnormal finding of lung field: Secondary | ICD-10-CM | POA: Diagnosis not present

## 2019-06-05 DIAGNOSIS — J449 Chronic obstructive pulmonary disease, unspecified: Secondary | ICD-10-CM | POA: Diagnosis not present

## 2019-06-05 DIAGNOSIS — R0602 Shortness of breath: Secondary | ICD-10-CM

## 2019-06-05 DIAGNOSIS — R9389 Abnormal findings on diagnostic imaging of other specified body structures: Secondary | ICD-10-CM

## 2019-06-05 NOTE — Assessment & Plan Note (Signed)
Plan: We will get a chest x-ray today to compare to January/2021

## 2019-06-05 NOTE — Assessment & Plan Note (Signed)
Plan: Continue Anoro Ellipta Continue to increase physical activity Chest x-ray today to compare to January/2021 67-month follow-up with Dr. Vaughan Browner

## 2019-06-05 NOTE — Assessment & Plan Note (Signed)
Lab work in January/2021 stable Chest x-ray in January/2021 showed potential opacity, patient treated with Z-Pak Still believe the patient shortness of breath is likely directly related to sedentary lifestyle and weight increases.  Plan: We will continue to monitor the patient clinically Chest x-ray today Continue Anoro Ellipta Follow-up with our office in 3 months Work to increase physical activity

## 2019-06-05 NOTE — Patient Instructions (Addendum)
You were seen today by Lauraine Rinne, NP  for:   1. Chronic obstructive pulmonary disease, unspecified COPD type (HCC)  Anoro Ellipta  >>> Take 1 puff daily in the morning right when you wake up >>>Rinse your mouth out after use >>>This is a daily maintenance inhaler, NOT a rescue inhaler >>>Contact our office if you are having difficulties affording or obtaining this medication >>>It is important for you to be able to take this daily and not miss any doses  Note your daily symptoms > remember "red flags" for COPD:   >>>Increase in cough >>>increase in sputum production >>>increase in shortness of breath or activity  intolerance.   If you notice these symptoms, please call the office to be seen.    2. Abnormal findings on diagnostic imaging of lung  - DG Chest 2 View; Future   We recommend today:  Orders Placed This Encounter  Procedures  . DG Chest 2 View    Standing Status:   Future    Standing Expiration Date:   08/04/2020    Order Specific Question:   Reason for Exam (SYMPTOM  OR DIAGNOSIS REQUIRED)    Answer:   follow up - opacity on 04/19/19 cxry    Order Specific Question:   Preferred imaging location?    Answer:   Internal    Order Specific Question:   Radiology Contrast Protocol - do NOT remove file path    Answer:   [email or web address]   Orders Placed This Encounter  Procedures  . DG Chest 2 View   No orders of the defined types were placed in this encounter.   Follow Up:    Return in about 3 months (around 09/05/2019), or if symptoms worsen or fail to improve, for Follow up with Dr. Vaughan Browner.   Please do your part to reduce the spread of COVID-19:      Reduce your risk of any infection  and COVID19 by using the similar precautions used for avoiding the common cold or flu:  Marland Kitchen Wash your hands often with soap and warm water for at least 20 seconds.  If soap and water are not readily available, use an alcohol-based hand  sanitizer with at least 60% alcohol.  . If coughing or sneezing, cover your mouth and nose by coughing or sneezing into the elbow areas of your shirt or coat, into a tissue or into your sleeve (not your hands). Langley Gauss A MASK when in public  . Avoid shaking hands with others and consider head nods or verbal greetings only. . Avoid touching your eyes, nose, or mouth with unwashed hands.  . Avoid close contact with people who are sick. . Avoid places or events with large numbers of people in one location, like concerts or sporting events. . If you have some symptoms but not all symptoms, continue to monitor at home and seek medical attention if your symptoms worsen. . If you are having a medical emergency, call 911.   [place] / e-Visit: [email or web address]         [facility]: [facility]: [id number]                   [facility]: [id number]     It is flu season:   >>> Best ways to protect herself from the flu: Receive the yearly flu vaccine, practice good hand hygiene washing with soap and also using  hand sanitizer when available, eat a nutritious meals, get adequate rest, hydrate appropriately   Please contact the office if your symptoms worsen or you have concerns that you are not improving.   Thank you for choosing [facility] for your healthcare, and for allowing [place] to partner with you on your healthcare journey. I am thankful to be able to provide care to you today.   Wyn Quaker FNP-C   COPD and Physical Activity Chronic obstructive pulmonary disease (COPD) is a long-term (chronic) condition that affects the lungs. COPD is a general term that can be used to describe many different lung problems that cause lung swelling (inflammation) and limit airflow, including chronic bronchitis and emphysema. The main symptom  of COPD is shortness of breath, which makes it harder to do even simple tasks. This can also make it harder to exercise and be active. Talk with your health care provider about treatments to help you breathe better and actions you can take to prevent breathing problems during physical activity. What are the benefits of exercising with COPD? Exercising regularly is an important part of a healthy lifestyle. You can still exercise and do physical activities even though you have COPD. Exercise and physical activity improve your shortness of breath by increasing blood flow (circulation). This causes your heart to pump more oxygen through your body. Moderate exercise can improve your:  Oxygen use.  Energy level.  Shortness of breath.  Strength in your breathing muscles.  Heart health.  Sleep.  Self-esteem and feelings of self-worth.  Depression, stress, and anxiety levels. Exercise can benefit everyone with COPD. The severity of your disease may affect how hard you can exercise, especially at first, but everyone can benefit. Talk with your health care provider about how much exercise is safe for you, and which activities and exercises are safe for you. What actions can I take to prevent breathing problems during physical activity?  Sign up for a pulmonary rehabilitation program. This type of program may include: ? Education about lung diseases. ? Exercise classes that teach you how to exercise and be more active while improving your breathing. This usually involves:  Exercise using your lower extremities, such as a stationary bicycle.  About 30 minutes of exercise, 2 to 5 times per week, for 6 to 12 weeks  Strength training, such as push ups or leg lifts. ? Nutrition education. ? Group classes in which you can talk with others who also have COPD and learn ways to manage stress.  If you use an oxygen tank, you should use it while you exercise. Work with your health care provider to adjust  your oxygen for your physical activity. Your resting flow rate is different from your flow rate during physical activity.  While you are exercising: ? Take slow breaths. ? Pace yourself and do not try to go too fast. ? Purse your lips while breathing out. Pursing your lips is similar to a kissing or whistling position. ? If doing exercise that uses a quick burst of effort, such as weight lifting:  Breathe in before starting the exercise.  Breathe out during the hardest part of the exercise (such as raising the weights). Where to find support You can find support for exercising with COPD from:  Your health care provider.  A pulmonary rehabilitation program.  Your local health department or community health programs.  Support groups, online or in-person. Your health care provider may be able to recommend support groups. Where to find more  information You can find more information about exercising with COPD from:  American Lung Association: ClassInsider.se.  COPD Foundation: https://www.rivera.net/. Contact a health care provider if:  Your symptoms get worse.  You have chest pain.  You have nausea.  You have a fever.  You have trouble talking or catching your breath.  You want to start a new exercise program or a new activity. Summary  COPD is a general term that can be used to describe many different lung problems that cause lung swelling (inflammation) and limit airflow. This includes chronic bronchitis and emphysema.  Exercise and physical activity improve your shortness of breath by increasing blood flow (circulation). This causes your heart to provide more oxygen to your body.  Contact your health care provider before starting any exercise program or new activity. Ask your health care provider what exercises and activities are safe for you. This information is not intended to replace advice given to you by your health care provider. Make sure you discuss any questions you have  with your health care provider. Document Revised: 07/11/2018 Document Reviewed: 04/13/2017 Elsevier Patient Education  2020 [employer].

## 2019-06-07 ENCOUNTER — Other Ambulatory Visit: Payer: Self-pay | Admitting: Family Medicine

## 2019-06-07 NOTE — Telephone Encounter (Signed)
Requesting: zolpidem Contract:n/a for this med UDS:11/08/18 Last Visit:03/12/19 Next Visit:08/13/19 Last Refill:11/27/18(90,1)  Please Advise. Medication pending

## 2019-06-24 ENCOUNTER — Other Ambulatory Visit: Payer: Self-pay

## 2019-06-24 MED ORDER — ATORVASTATIN CALCIUM 80 MG PO TABS: 80.0000 mg | ORAL_TABLET | Freq: Every day | ORAL | 3 refills | Status: DC

## 2019-06-24 NOTE — Telephone Encounter (Signed)
Pt.notified

## 2019-06-24 NOTE — Telephone Encounter (Signed)
Patient refill request  atorvastatin (LIPITOR) 80 MG tablet [id number]    [facility] [address]

## 2019-06-24 NOTE — Telephone Encounter (Signed)
RF request for Atorvastatin LOV:03/12/19 Next ov: 08/13/19 Last written: 05/16/18(90,3)   Please advise, thanks. Not originally prescribed by you. Medication pending

## 2019-06-26 ENCOUNTER — Encounter: Payer: Self-pay | Admitting: Family Medicine

## 2019-06-26 ENCOUNTER — Ambulatory Visit (INDEPENDENT_AMBULATORY_CARE_PROVIDER_SITE_OTHER): Payer: Medicare Other | Admitting: Family Medicine

## 2019-06-26 ENCOUNTER — Ambulatory Visit (INDEPENDENT_AMBULATORY_CARE_PROVIDER_SITE_OTHER): Payer: Medicare Other

## 2019-06-26 ENCOUNTER — Other Ambulatory Visit: Payer: Self-pay

## 2019-06-26 ENCOUNTER — Other Ambulatory Visit: Payer: Medicare Other

## 2019-06-26 VITALS — BP 133/82 | HR 66 | Temp 98.1°F | Resp 16 | Ht 67.5 in | Wt 182.0 lb

## 2019-06-26 DIAGNOSIS — R1031 Right lower quadrant pain: Secondary | ICD-10-CM | POA: Diagnosis not present

## 2019-06-26 DIAGNOSIS — R1032 Left lower quadrant pain: Secondary | ICD-10-CM

## 2019-06-26 DIAGNOSIS — R319 Hematuria, unspecified: Secondary | ICD-10-CM

## 2019-06-26 DIAGNOSIS — Z87442 Personal history of urinary calculi: Secondary | ICD-10-CM

## 2019-06-26 DIAGNOSIS — R310 Gross hematuria: Secondary | ICD-10-CM | POA: Diagnosis not present

## 2019-06-26 LAB — POCT URINALYSIS DIPSTICK
Bilirubin, UA: NEGATIVE
Blood, UA: NEGATIVE
Glucose, UA: NEGATIVE
Ketones, UA: NEGATIVE
Leukocytes, UA: NEGATIVE
Nitrite, UA: NEGATIVE
Protein, UA: NEGATIVE
Spec Grav, UA: 1.01 (ref 1.010–1.025)
Urobilinogen, UA: 0.2 E.U./dL
pH, UA: 7 (ref 5.0–8.0)

## 2019-06-26 NOTE — Patient Instructions (Addendum)
[facility], [place], [place] [address]. [place] [phone]

## 2019-06-26 NOTE — Progress Notes (Signed)
OFFICE VISIT  06/26/2019   CC:  Chief Complaint  Patient presents with  . Blood in urine    started yesterday, declines burning  . Abdominal Pain    lower, started yesterday   HPI:    Patient is a 69 y.o. Caucasian female who presents for "blood in urine, lower abd pain". She does have hx of recurrent nephro/ureterolithiasis.  Most recent cystoscopy with ureteroscopy and stent placement was 2015.    Yesterday starting in afternoon her urine was red, pink today.  No preceding sx's such as urinary frequency, urgency, or dysuria.  At onset yesterday she also felt bilat groin area pain that has persisted. No flank pain or back pain.  No fevers.  No AZO was taken.  No vag bleeding, no BRBPR, no melena.  No nausea or vomiting or fevers. no CP, no cough, no dizziness, no HAs, no rashes, no melena/hematochezia.  No polyuria or polydipsia.  No myalgias or arthralgias.  No focal weakness, paresthesias, or tremors.  No acute vision or hearing abnormalities. No n/v/d or abd pain.  No palpitations.     Past Medical History:  Diagnosis Date  . Alcoholism in remission [facility])    states last alcohol 11 yrs ago  . Anxiety   . Asymptomatic gallstones    u/s 03/2019 (noted on imaging prior to that though)  . Bipolar 1 disorder ([place])    Admission for mania x 2 (most recent 11/2017)  . Cervical spondylosis without myelopathy 12/19/2013   MRI 02/2014: multilevel DDD/spondylosis, not much change compared to prior MRI.  Pt not in favor of invasive therapy for her neck as of 04/2014.  Marland Kitchen COPD (chronic obstructive pulmonary disease) (HCC)    Moderate: anoro started 04/2017 by pulm, pt symptomatically improved and PFTs stable at f/u 06/2017.  Marland Kitchen Coronary artery spasm ([place]) 07/11/2018   nitrates=HA. Amlodipine=intol swelling.  Changed to coreg 09/03/18 by cardiologist.  . Dilutional hyponatremia   . Endometritis 05/2017   possible; empiric tx with Flagyl by GYN.  . Environmental allergies   . Fibromyalgia   .  GERD (gastroesophageal reflux disease)   . Hepatic steatosis 03/2019   u/s abd  . Herpes zoster 08/07/2017   L scapular region  . Hiatal hernia   . History of adenomatous polyp of colon 04/2008; 08/2014   No high grade dysplasia: recall 5 yrs ([name], [name])  . History of basal cell carcinoma excision    NOSE  . History of Helicobacter pylori infection 04/2008   +gastric biopsy (gastritis but no metaplasia, dysplasia, or malignancy identified)  . History of TIA (transient ischemic attack)    secondary to HELLP syndrome 1994 post c/s--  residual memory loss  . Hyperlipidemia    statin started after her NSTEMI: goal  LDL <70.  Marland Kitchen Hypertension   . Internal hemorrhoids   . Ischemic cardiomyopathy 05/2018   Echo EF 45-50% in context of NSTEMI from CA spasm.  . 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 04/2017.  . Memory loss    Abnl MRI brain and CT brain c/w chronic microvascular ischemia.  Repeat MRI brain 02/2014 stable (Dr. Mayra Reel with no plan to f/u with neuro as of 04/2014)  . NSTEMI (non-ST elevated myocardial infarction) ([place]) 05/2018   Echo EF 45-50% in context of NSTEMI from CA spasm.  . Osteoporosis 05/2010  2012 'penia; 06/2015 'porosis:  prolia.  DEXA 09/2017 T-score -2.2 femur neck.  As of 11/2018 we're restarting prolia soon and will check next DEXA 1 yr from her next prolia injection.  . Pelvic floor dysfunction    [facility] ([name])  . Postmenopausal vaginal bleeding 05/2017   x 1 small episode; GYN attempted endo bx but unable to penetrate cervix due to severe cerv stenosis (due to postmenopausal state + hx of LEEP).  Endo u/s showed thin endo lining.  Per GYN---no evidence of endomet pathology on w/u---obs as of 07/2017.  Marland Kitchen Prediabetes 06/2016   Fasting gluc 105; HbA1c at  that time was 6.0%.  A1c 6.2% 12/2017.  Marland Kitchen Recurrent kidney stones 08/2013   Left ureteral calculus: Perc nephr--cystoscopy w/ureteroscopy + laser for stone removal.  Residual asymptomatic left renal nephrolithiasis <27mm present post-procedure.  Right sided hydronephrosis-persistent (on u/s)--urol ordered CT to further eval 08/2016: 1.6 cm nonobstructing stone lower pole left kidney, no hydro, plan for PCN extraction ([facility])  . Shortness of breath   . Sprain of neck 12/19/2013  . TMJ (temporomandibular joint disorder)    USES  MOUTH GUARD  . Toe fracture, left 12/2017   2nd toe prox phalanx (immobilization, [name])  . Vitamin D deficiency 05/2011  . Weak urinary stream 07/2016   with elevated PVR and mild left hydronephrosis secondary to this ([facility] started flomax 0.4mg  qd for this).    Past Surgical History:  Procedure Laterality Date  . ANTERIOR CERVICAL DECOMP/DISCECTOMY FUSION  06-30-2009   C3 -- C5  AND EXPLORATION OF FUSION C5-7 W/  PLATE REMOVAL  . BUNIONECTOMY Left   . CERVICAL FUSION  2002   anterior C5 -- C7  . CERVICAL SPINE SURGERY  08-27-1999     C6 -- T1  LAMINECTOMY/  DISKECTOMY  . CESAREAN SECTION  1994  . COLONOSCOPY W/ POLYPECTOMY  04/2008;08/2014   2016 tubular adenoma x 1; +diverticulosis and int/ext hemorrhoids.  Recall 5 yrs  . CYSTOSCOPY WITH URETEROSCOPY AND STENT PLACEMENT Left 08/28/2013   Procedure: CYSTOSCOPY, RGP,  WITH URETEROSCOPY AND STENT REMOVAL;  Surgeon: [name], MD;  Location: [facility];  Service: Urology;  Laterality: Left;  . DEXA  09/2017   T-score -2.2 femur neck: improved compared to 06/2015.  Marland Kitchen ESOPHAGOGASTRODUODENOSCOPY  2010; 08/2014   +Candidal esophagitis; Mild chronic gastritis w/intestinal metaplasia--NEG H pylori, neg for eosinophilic esoph  . HOLMIUM LASER APPLICATION Left [id number]   Procedure: HOLMIUM LASER APPLICATION;  Surgeon: [name], MD;  Location: [facility];  Service:  Urology;  Laterality: Left;  . LEFT HEART CATH AND CORONARY ANGIOGRAPHY N/A 05/15/2018   Evidence for spasm in distal LAD.  Mod RCA atherosclerosis, o/w no CAD.  Procedure: LEFT HEART CATH AND CORONARY ANGIOGRAPHY;  Surgeon: Leonie Man, MD;  Location: [place] [facility];  Service: Cardiovascular;  Laterality: N/A;  . NEGATIVE SLEEP STUDY  04-01-2007  . NEPHROLITHOTOMY Left 08/05/2013   Procedure: NEPHROLITHOTOMY PERCUTANEOUS;  Surgeon: [name], MD;  Location: [facility];  Service: Urology;  Laterality: Left;  . POLYSOMNOGRAM  01/2019   NORMAL  . POLYSOMNOGRAPHY  10/25/2018   Dr. Dion Saucier due to pt inadequat sleep time (83 min).  . TONSILLECTOMY AND ADENOIDECTOMY  1975  . TOTAL KNEE ARTHROPLASTY Right 2003  . TRANSTHORACIC ECHOCARDIOGRAM  05/2018   (after MI secondary to arterial spasm): EF 45-50%; severe hypokinesis of apical anterolateral, inferolateral , anterior, and inferior LV myocardium.  Marland Kitchen  ULTRASOUND EXAM,PELVIC COMPLETE ([facility] HX)  06/25/15   NORMAL (done by GYN)    Outpatient Medications Prior to Visit  Medication Sig Dispense Refill  . Acetaminophen 500 MG coapsule Take 650 mg by mouth every 6 (six) hours as needed for mild pain or headache.     . ALPRAZolam (XANAX) 1 MG tablet TAKE 1 TABLET (1 MG TOTAL) BY MOUTH 3 (THREE) TIMES DAILY. 270 tablet 1  . ANORO ELLIPTA 62.5-25 MCG/INH AEPB INHALE 1 PUFF BY MOUTH EVERY DAY 60 each 11  . aspirin EC 81 MG EC tablet Take 1 tablet (81 mg total) by mouth daily. 90 tablet 3  . atorvastatin (LIPITOR) 80 MG tablet Take 1 tablet (80 mg total) by mouth daily at 6 PM. 90 tablet 3  . carvedilol (COREG) 25 MG tablet TAKE 1 TABLET (25 MG TOTAL) BY MOUTH 2 (TWO) TIMES DAILY WITH A MEAL. 180 tablet 1  . citalopram (CELEXA) 20 MG tablet TAKE 1 TABLET BY MOUTH TWICE A DAY 180 tablet 1  . cyclobenzaprine (FLEXERIL) 10 MG tablet Take 1 tablet (10 mg total) by mouth 2 (two) times daily. 60 tablet 6  . doxycycline (VIBRAMYCIN) 50  MG capsule Take 50 mg by mouth daily.   3  . famotidine (PEPCID) 40 MG tablet Take 1 tablet (40 mg total) by mouth daily. 30 tablet 11  . fluticasone (FLONASE) 50 MCG/ACT nasal spray SPRAY 2 SPRAYS INTO EACH NOSTRIL EVERY DAY 48 mL 2  . hydrochlorothiazide (HYDRODIURIL) 25 MG tablet TAKE 1 TABLET BY MOUTH EVERY DAY 90 tablet 0  . lamoTRIgine (LAMICTAL) 100 MG tablet TAKE 3 TABLETS (300 MG TOTAL) BY MOUTH AT BEDTIME. 270 tablet 0  . losartan (COZAAR) 50 MG tablet Take 1 tablet (50 mg total) by mouth daily. (Patient taking differently: Take 50 mg by mouth daily. Pt is taking 2 tablets daily.) 90 tablet 3  . Magnesium 250 MG TABS Take 1 tablet by mouth daily.    . montelukast (SINGULAIR) 10 MG tablet TAKE 1 TABLET BY MOUTH EVERY DAY IN THE EVENING 90 tablet 3  . Multiple Vitamin (MULTI-VITAMINS) TABS Take 1 tablet by mouth daily.     . polyethylene glycol powder (GLYCOLAX/MIRALAX) powder TAKE 17 G BY MOUTH DAILY. 3162 g 0  . potassium citrate (UROCIT-K) 10 MEQ (1080 MG) SR tablet Take 10 mEq by mouth 2 (two) times a day.     Marland Kitchen Propylene Glycol (SYSTANE COMPLETE) 0.6 % SOLN Apply 1 drop to eye 2 (two) times daily.    Marland Kitchen tretinoin (RETIN-A) 0.025 % cream APPLY TO FACE AT BEDTIME AS NEEDED    . triamcinolone cream (KENALOG) 0.1 % Apply 1 application topically daily.    . TURMERIC PO Take 1 tablet by mouth 2 (two) times a day.    . zolpidem (AMBIEN) 10 MG tablet TAKE 1 TABLET BY MOUTH EVERYDAY AT BEDTIME 90 tablet 1  . nitroGLYCERIN (NITROSTAT) 0.4 MG SL tablet Place 1 tablet (0.4 mg total) under the tongue every 5 (five) minutes x 3 doses as needed for chest pain. (Patient not taking: Reported on 06/26/2019) 25 tablet 3   No facility-administered medications prior to visit.    Allergies  Allergen Reactions  . Ceftin [Cefuroxime Axetil] Anaphylaxis    Swelling of eyes and tongue  . Ciprofloxacin Anaphylaxis    Difficulty breathing and generalized swelling  . Fosamax [Alendronate Sodium] Diarrhea   . Morphine And Related Other (See Comments)    Hallucinations/ disoriented - pt stated, "Only Morphine" -  03/23/17  . Prednisone Other (See Comments)    Hyperactivity  . Seroquel [Quetiapine] Other (See Comments)    oversedation    ROS As per HPI  PE: Blood pressure 133/82, pulse 66, temperature 98.1 F (36.7 C), temperature source Temporal, resp. rate 16, height 5' 7.5" (1.715 m), weight 182 lb (82.6 kg), SpO2 96 %. Pt examined with Jacklynn Ganong, CMA, as chaperone.  Gen: Alert, well appearing.  Patient is oriented to person, place, time, and situation. AFFECT: pleasant, lucid thought and speech. CV: RRR, no m/r/g.   LUNGS: CTA bilat, nonlabored resps, good aeration in all lung fields. ABD: soft, NT, ND, BS normal.  No hepatospenomegaly or mass.  No bruits. No CVA or side or flank tenderness. Bilat groin regions mildly TTP but no bulge or induration or fluctuance.  LABS:    Chemistry      Component Value Date/Time   NA 134 (L) 04/19/2019 1230   NA 142 09/25/2018 1007   K 3.7 04/19/2019 1230   CL 96 04/19/2019 1230   CO2 29 04/19/2019 1230   BUN 11 04/19/2019 1230   BUN 11 09/25/2018 1007   CREATININE 0.91 04/19/2019 1230   CREATININE 1.27 (H) 05/16/2016 1525      Component Value Date/Time   CALCIUM 9.4 04/19/2019 1230   ALKPHOS 53 04/19/2019 1230   AST 19 04/19/2019 1230   ALT 18 04/19/2019 1230   BILITOT 0.7 04/19/2019 1230   BILITOT 0.7 09/25/2018 1007     Lab Results  Component Value Date   WBC 5.9 04/19/2019   HGB 11.3 (L) 04/19/2019   HCT 33.1 (L) 04/19/2019   MCV 91.8 04/19/2019   PLT 252.0 04/19/2019   POC CC UA today: completely normal today  IMPRESSION AND PLAN:  1) Gross hematuria: urine normal on UA here today.  Urine sent for c/s for completeness. No sx's of UTI.  She does have some mild bilat groin pain--unclear significance. Hx of large kidney stone requiring holmium laser and then subsequent cystoscopy with ureteral stone extraction b/c  stone did not completely break up adequately.  The last time her bladder was visualized with cystoscopy was 2015. Plan: she needs to contact her urologist for f/u appt: contact info given to her today b/c she could not recall specifics: [facility], [place], [place] [address]. [place] [phone]  X-ray abd/KUB ordered to see if large stone present near kidney/ureter junction that MAY explain gross hematuria. However, I still think she is going to need CT abd/pelv renal protocol and cystoscopy by her urologist to r/o mass.   An After Visit Summary was printed and given to the patient.  FOLLOW UP: Return if symptoms worsen or fail to improve.  Signed:  Crissie Sickles, MD           06/26/2019

## 2019-06-27 ENCOUNTER — Telehealth: Payer: Self-pay

## 2019-06-27 DIAGNOSIS — I700 Atherosclerosis of aorta: Secondary | ICD-10-CM | POA: Diagnosis not present

## 2019-06-27 DIAGNOSIS — R319 Hematuria, unspecified: Secondary | ICD-10-CM | POA: Diagnosis not present

## 2019-06-27 DIAGNOSIS — R1032 Left lower quadrant pain: Secondary | ICD-10-CM | POA: Diagnosis not present

## 2019-06-27 DIAGNOSIS — K5730 Diverticulosis of large intestine without perforation or abscess without bleeding: Secondary | ICD-10-CM | POA: Diagnosis not present

## 2019-06-27 DIAGNOSIS — K8020 Calculus of gallbladder without cholecystitis without obstruction: Secondary | ICD-10-CM | POA: Diagnosis not present

## 2019-06-27 DIAGNOSIS — Z87442 Personal history of urinary calculi: Secondary | ICD-10-CM | POA: Diagnosis not present

## 2019-06-27 NOTE — Telephone Encounter (Signed)
Patient was contacted and given results. Notes have been faxed to [facility] @ [facility]. Nothing further needed.

## 2019-06-27 NOTE — Telephone Encounter (Signed)
See result note. Done

## 2019-06-27 NOTE — Telephone Encounter (Signed)
Patient has an appt with [facility] Urologist tomorrow.  They are requesting results of xray that was done yesterday 3/25 to be faxed to them prior to the appt.  The fax # is [phone].

## 2019-06-27 NOTE — Telephone Encounter (Signed)
Please review pt's imaging results when you have a chance. Pt has an appt with her urologist tomorrow and they would like the results.

## 2019-06-30 ENCOUNTER — Other Ambulatory Visit: Payer: Self-pay | Admitting: Family Medicine

## 2019-07-01 NOTE — Telephone Encounter (Signed)
RF request for Cyclobenzaprine LOV:06/26/19, acute Next ov: 08/13/19 Last written:11/20/18(60,6)  Please Advise. Medication pending

## 2019-07-02 NOTE — Telephone Encounter (Signed)
Rx sent 

## 2019-07-08 ENCOUNTER — Other Ambulatory Visit: Payer: Self-pay | Admitting: Family Medicine

## 2019-07-10 DIAGNOSIS — H2513 Age-related nuclear cataract, bilateral: Secondary | ICD-10-CM | POA: Diagnosis not present

## 2019-07-29 ENCOUNTER — Other Ambulatory Visit: Payer: Self-pay | Admitting: Family Medicine

## 2019-07-29 DIAGNOSIS — Z1231 Encounter for screening mammogram for malignant neoplasm of breast: Secondary | ICD-10-CM

## 2019-08-07 ENCOUNTER — Other Ambulatory Visit: Payer: Self-pay

## 2019-08-13 ENCOUNTER — Encounter: Payer: Self-pay | Admitting: Family Medicine

## 2019-08-13 ENCOUNTER — Ambulatory Visit (INDEPENDENT_AMBULATORY_CARE_PROVIDER_SITE_OTHER): Payer: Medicare Other | Admitting: Family Medicine

## 2019-08-13 ENCOUNTER — Other Ambulatory Visit: Payer: Self-pay

## 2019-08-13 VITALS — BP 102/70 | HR 68 | Temp 98.4°F | Resp 16 | Ht 67.5 in | Wt 183.6 lb

## 2019-08-13 DIAGNOSIS — D649 Anemia, unspecified: Secondary | ICD-10-CM

## 2019-08-13 DIAGNOSIS — F319 Bipolar disorder, unspecified: Secondary | ICD-10-CM | POA: Diagnosis not present

## 2019-08-13 DIAGNOSIS — F411 Generalized anxiety disorder: Secondary | ICD-10-CM | POA: Diagnosis not present

## 2019-08-13 DIAGNOSIS — I10 Essential (primary) hypertension: Secondary | ICD-10-CM | POA: Diagnosis not present

## 2019-08-13 DIAGNOSIS — R7303 Prediabetes: Secondary | ICD-10-CM | POA: Diagnosis not present

## 2019-08-13 DIAGNOSIS — E7800 Pure hypercholesterolemia, unspecified: Secondary | ICD-10-CM

## 2019-08-13 LAB — BASIC METABOLIC PANEL
BUN: 11 mg/dL (ref 6–23)
CO2: 29 mEq/L (ref 19–32)
Calcium: 9.3 mg/dL (ref 8.4–10.5)
Chloride: 94 mEq/L — ABNORMAL LOW (ref 96–112)
Creatinine, Ser: 0.91 mg/dL (ref 0.40–1.20)
GFR: 61.73 mL/min (ref >60.00)
Glucose, Bld: 102 mg/dL — ABNORMAL HIGH (ref 70–99)
Potassium: 3.6 mEq/L (ref 3.5–5.1)
Sodium: 130 mEq/L — ABNORMAL LOW (ref 135–145)

## 2019-08-13 LAB — CBC WITH DIFFERENTIAL/PLATELET
Basophils Absolute: 0 10*3/uL (ref 0.0–0.1)
Basophils Relative: 0.4 % (ref 0.0–3.0)
Eosinophils Absolute: 0.1 10*3/uL (ref 0.0–0.7)
Eosinophils Relative: 1.8 % (ref 0.0–5.0)
HCT: 31.1 % — ABNORMAL LOW (ref 36.0–46.0)
Hemoglobin: 10.8 g/dL — ABNORMAL LOW (ref 12.0–15.0)
Lymphocytes Relative: 18.6 % (ref 12.0–46.0)
Lymphs Abs: 1.4 10*3/uL (ref 0.7–4.0)
MCHC: 34.6 g/dL (ref 30.0–36.0)
MCV: 91.3 fl (ref 78.0–100.0)
Monocytes Absolute: 0.7 10*3/uL (ref 0.1–1.0)
Monocytes Relative: 9.1 % (ref 3.0–12.0)
Neutro Abs: 5.4 10*3/uL (ref 1.4–7.7)
Neutrophils Relative %: 70.1 % (ref 43.0–77.0)
Platelets: 225 10*3/uL (ref 150.0–400.0)
RBC: 3.41 Mil/uL — ABNORMAL LOW (ref 3.87–5.11)
RDW: 13.7 % (ref 11.5–15.5)
WBC: 7.7 10*3/uL (ref 4.0–10.5)

## 2019-08-13 LAB — HEMOGLOBIN A1C: Hgb A1c MFr Bld: 5.6 % (ref 4.6–6.5)

## 2019-08-13 NOTE — Progress Notes (Signed)
OFFICE VISIT  08/13/2019   CC:  Chief Complaint  Patient presents with  . Follow-up    RCI, pt is not fasting   HPI:    Patient is a 69 y.o. Caucasian female who presents for f/u HTN, prediabetes, bipolar d/o, and GAD. Fibromyalgia pain flaring today, feeling stiff and mild/mod pain all over.  No swelling of joints, no rash, no fevers.  HTN: home bp's consistently <130/80. Mood: pretty stable/at her baseline.  If she spends time around people she is fine. Gets down when alone too much or looks at social media/listens to news but this doesn't last more than a day or so at a time.  No hypomania/mania sx's. Anxiety: no panic but she still worries a lot about essentially everything. Appetite up and down.  Sleep is fine.  No signif dieting or exercise. Admits diet is not low in carbs or fats.     PMP AWARE reviewed today: most recent rx for alprazolam 1mg  was filled 07/26/19, # #270, rx by me. Most recent ambien rx filled 06/07/19, #90,rx by me. No red flags.  ROS: no fevers, no CP, no SOB, no wheezing, no cough, no dizziness, no HAs, no rashes, no melena/hematochezia.  No polyuria or polydipsia.  No focal weakness, paresthesias, or tremors.  No acute vision or hearing abnormalities. No n/v/d or abd pain.  No palpitations.     Past Medical History:  Diagnosis Date  . Alcoholism in remission [facility])    states last alcohol 11 yrs ago  . Anxiety   . Asymptomatic gallstones    u/s 03/2019 (noted on imaging prior to that though)  . Bipolar 1 disorder ([place])    Admission for mania x 2 (most recent 11/2017)  . Cervical spondylosis without myelopathy 12/19/2013   MRI 02/2014: multilevel DDD/spondylosis, not much change compared to prior MRI.  Pt not in favor of invasive therapy for her neck as of 04/2014.  Marland Kitchen COPD (chronic obstructive pulmonary disease) (HCC)    Moderate: anoro started 04/2017 by pulm, pt symptomatically improved and PFTs stable at f/u 06/2017.  Marland Kitchen Coronary artery spasm ([place])  07/11/2018   nitrates=HA. Amlodipine=intol swelling.  Changed to coreg 09/03/18 by cardiologist.  . Dilutional hyponatremia   . Endometritis 05/2017   possible; empiric tx with Flagyl by GYN.  . Environmental allergies   . Fibromyalgia   . GERD (gastroesophageal reflux disease)   . Hepatic steatosis 03/2019   u/s abd  . Herpes zoster 08/07/2017   L scapular region  . Hiatal hernia   . History of adenomatous polyp of colon 04/2008; 08/2014   No high grade dysplasia: recall 5 yrs ([name], [name])  . History of basal cell carcinoma excision    NOSE  . History of Helicobacter pylori infection 04/2008   +gastric biopsy (gastritis but no metaplasia, dysplasia, or malignancy identified)  . History of TIA (transient ischemic attack)    secondary to HELLP syndrome 1994 post c/s--  residual memory loss  . Hyperlipidemia    statin started after her NSTEMI: goal  LDL <70.  Marland Kitchen Hypertension   . Internal hemorrhoids   . Ischemic cardiomyopathy 05/2018   Echo EF 45-50% in context of NSTEMI from CA spasm.  . 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 04/2017.  . Memory loss    Abnl MRI brain and CT brain  c/w chronic microvascular ischemia.  Repeat MRI brain 02/2014 stable (Dr. Mayra Reel with no plan to f/u with neuro as of 04/2014)  . NSTEMI (non-ST elevated myocardial infarction) ([place]) 05/2018   Echo EF 45-50% in context of NSTEMI from CA spasm.  . Osteoporosis 05/2010   2012 'penia; 06/2015 'porosis:  prolia.  DEXA 09/2017 T-score -2.2 femur neck.  As of 11/2018 we're restarting prolia soon and will check next DEXA 1 yr from her next prolia injection.  . Pelvic floor dysfunction    [facility] ([name])  . Postmenopausal vaginal bleeding 05/2017   x 1 small episode; GYN attempted endo bx but unable to  penetrate cervix due to severe cerv stenosis (due to postmenopausal state + hx of LEEP).  Endo u/s showed thin endo lining.  Per GYN---no evidence of endomet pathology on w/u---obs as of 07/2017.  Marland Kitchen Prediabetes 06/2016   Fasting gluc 105; HbA1c at that time was 6.0%.  A1c 6.2% 12/2017.  Marland Kitchen Recurrent kidney stones 08/2013   Left ureteral calculus: Perc nephr--cystoscopy w/ureteroscopy + laser for stone removal.  Residual asymptomatic left renal nephrolithiasis <26mm present post-procedure.  Right sided hydronephrosis-persistent (on u/s)--urol ordered CT to further eval 08/2016: 1.6 cm nonobstructing stone lower pole left kidney, no hydro, plan for PCN extraction ([facility])  . Shortness of breath   . Sprain of neck 12/19/2013  . TMJ (temporomandibular joint disorder)    USES  MOUTH GUARD  . Toe fracture, left 12/2017   2nd toe prox phalanx (immobilization, [name])  . Vitamin D deficiency 05/2011  . Weak urinary stream 07/2016   with elevated PVR and mild left hydronephrosis secondary to this ([facility] started flomax 0.4mg  qd for this).    Past Surgical History:  Procedure Laterality Date  . ANTERIOR CERVICAL DECOMP/DISCECTOMY FUSION  06-30-2009   C3 -- C5  AND EXPLORATION OF FUSION C5-7 W/  PLATE REMOVAL  . BUNIONECTOMY Left   . CERVICAL FUSION  2002   anterior C5 -- C7  . CERVICAL SPINE SURGERY  08-27-1999     C6 -- T1  LAMINECTOMY/  DISKECTOMY  . CESAREAN SECTION  1994  . COLONOSCOPY W/ POLYPECTOMY  04/2008;08/2014   2016 tubular adenoma x 1; +diverticulosis and int/ext hemorrhoids.  Recall 5 yrs  . CYSTOSCOPY WITH URETEROSCOPY AND STENT PLACEMENT Left 08/28/2013   Procedure: CYSTOSCOPY, RGP,  WITH URETEROSCOPY AND STENT REMOVAL;  Surgeon: [name], MD;  Location: [facility];  Service: Urology;  Laterality: Left;  . DEXA  09/2017   T-score -2.2 femur neck: improved compared to 06/2015.  Marland Kitchen ESOPHAGOGASTRODUODENOSCOPY  2010; 08/2014   +Candidal esophagitis; Mild  chronic gastritis w/intestinal metaplasia--NEG H pylori, neg for eosinophilic esoph  . HOLMIUM LASER APPLICATION Left [id number]   Procedure: HOLMIUM LASER APPLICATION;  Surgeon: [name], MD;  Location: [facility];  Service: Urology;  Laterality: Left;  . LEFT HEART CATH AND CORONARY ANGIOGRAPHY N/A 05/15/2018   Evidence for spasm in distal LAD.  Mod RCA atherosclerosis, o/w no CAD.  Procedure: LEFT HEART CATH AND CORONARY ANGIOGRAPHY;  Surgeon: Leonie Man, MD;  Location: [facility];  Service: Cardiovascular;  Laterality: N/A;  . NEGATIVE SLEEP STUDY  04-01-2007  . NEPHROLITHOTOMY Left 08/05/2013   Procedure: NEPHROLITHOTOMY PERCUTANEOUS;  Surgeon: [name], MD;  Location: [facility];  Service: Urology;  Laterality: Left;  . POLYSOMNOGRAM  01/2019   NORMAL  . POLYSOMNOGRAPHY  10/25/2018   Dr. Dion Saucier due  to pt inadequat sleep time (83 min).  . TONSILLECTOMY AND ADENOIDECTOMY  1975  . TOTAL KNEE ARTHROPLASTY Right 2003  . TRANSTHORACIC ECHOCARDIOGRAM  05/2018   (after MI secondary to arterial spasm): EF 45-50%; severe hypokinesis of apical anterolateral, inferolateral , anterior, and inferior LV myocardium.  Marland Kitchen ULTRASOUND EXAM,PELVIC COMPLETE ([facility] HX)  06/25/15   NORMAL (done by GYN)    Outpatient Medications Prior to Visit  Medication Sig Dispense Refill  . Acetaminophen 500 MG coapsule Take 650 mg by mouth every 6 (six) hours as needed for mild pain or headache.     . ALPRAZolam (XANAX) 1 MG tablet TAKE 1 TABLET (1 MG TOTAL) BY MOUTH 3 (THREE) TIMES DAILY. 270 tablet 1  . ANORO ELLIPTA 62.5-25 MCG/INH AEPB INHALE 1 PUFF BY MOUTH EVERY DAY 60 each 11  . aspirin EC 81 MG EC tablet Take 1 tablet (81 mg total) by mouth daily. 90 tablet 3  . atorvastatin (LIPITOR) 80 MG tablet Take 1 tablet (80 mg total) by mouth daily at 6 PM. 90 tablet 3  . carvedilol (COREG) 25 MG tablet TAKE 1 TABLET (25 MG TOTAL) BY MOUTH 2 (TWO) TIMES DAILY WITH A  MEAL. 180 tablet 1  . citalopram (CELEXA) 20 MG tablet TAKE 1 TABLET BY MOUTH TWICE A DAY 180 tablet 1  . cyclobenzaprine (FLEXERIL) 10 MG tablet TAKE 1 TABLET BY MOUTH TWICE A DAY 60 tablet 6  . doxycycline (VIBRAMYCIN) 50 MG capsule Take 50 mg by mouth daily.   3  . famotidine (PEPCID) 40 MG tablet Take 1 tablet (40 mg total) by mouth daily. 30 tablet 11  . fluticasone (FLONASE) 50 MCG/ACT nasal spray SPRAY 2 SPRAYS INTO EACH NOSTRIL EVERY DAY 48 mL 2  . hydrochlorothiazide (HYDRODIURIL) 25 MG tablet TAKE 1 TABLET BY MOUTH EVERY DAY 90 tablet 0  . lamoTRIgine (LAMICTAL) 100 MG tablet TAKE 3 TABLETS (300 MG TOTAL) BY MOUTH AT BEDTIME. 270 tablet 0  . losartan (COZAAR) 50 MG tablet Take 1 tablet (50 mg total) by mouth daily. (Patient taking differently: Take 50 mg by mouth daily. Pt is taking 2 tablets daily.) 90 tablet 3  . Magnesium 250 MG TABS Take 1 tablet by mouth daily.    . montelukast (SINGULAIR) 10 MG tablet TAKE 1 TABLET BY MOUTH EVERY DAY IN THE EVENING 90 tablet 3  . Multiple Vitamin (MULTI-VITAMINS) TABS Take 1 tablet by mouth daily.     . polyethylene glycol powder (GLYCOLAX/MIRALAX) powder TAKE 17 G BY MOUTH DAILY. 3162 g 0  . potassium citrate (UROCIT-K) 10 MEQ (1080 MG) SR tablet Take 10 mEq by mouth 2 (two) times a day.     Marland Kitchen Propylene Glycol (SYSTANE COMPLETE) 0.6 % SOLN Apply 1 drop to eye 2 (two) times daily.    . tamsulosin (FLOMAX) 0.4 MG CAPS capsule Take 0.4 mg by mouth daily.    Marland Kitchen tretinoin (RETIN-A) 0.025 % cream APPLY TO FACE AT BEDTIME AS NEEDED    . triamcinolone cream (KENALOG) 0.1 % Apply 1 application topically daily.    . TURMERIC PO Take 1 tablet by mouth 2 (two) times a day.    . zolpidem (AMBIEN) 10 MG tablet TAKE 1 TABLET BY MOUTH EVERYDAY AT BEDTIME 90 tablet 1  . nitroGLYCERIN (NITROSTAT) 0.4 MG SL tablet Place 1 tablet (0.4 mg total) under the tongue every 5 (five) minutes x 3 doses as needed for chest pain. (Patient not taking: Reported on 06/26/2019) 25  tablet 3  No facility-administered medications prior to visit.    Allergies  Allergen Reactions  . Ceftin [Cefuroxime Axetil] Anaphylaxis    Swelling of eyes and tongue  . Ciprofloxacin Anaphylaxis    Difficulty breathing and generalized swelling  . Fosamax [Alendronate Sodium] Diarrhea  . Morphine And Related Other (See Comments)    Hallucinations/ disoriented - pt stated, "Only Morphine" - 03/23/17  . Prednisone Other (See Comments)    Hyperactivity  . Seroquel [Quetiapine] Other (See Comments)    oversedation    ROS As per HPI  PE: Vitals with BMI 08/13/2019 06/26/2019 06/05/2019  Height 5' 7.5" 5' 7.5" 5' 7.5"  Weight 183 lbs 10 oz 182 lbs 174 lbs  BMI 28.31 [id number]  Systolic [id number]  Diastolic 70 82 76  Pulse 68 66 68    Gen: Alert, well appearing.  Patient is oriented to person, place, time, and situation. AFFECT: pleasant, lucid thought and speech. CV: RRR, no m/r/g.   LUNGS: CTA bilat, nonlabored resps, good aeration in all lung fields. EXT: no clubbing or cyanosis.  no edema.    LABS:  Lab Results  Component Value Date   TSH 1.55 02/19/2019   Lab Results  Component Value Date   WBC 5.9 04/19/2019   HGB 11.3 (L) 04/19/2019   HCT 33.1 (L) 04/19/2019   MCV 91.8 04/19/2019   PLT 252.0 04/19/2019   Lab Results  Component Value Date   IRON 84 04/29/2014   FERRITIN 27.6 04/29/2014   Lab Results  Component Value Date   VITAMINB12 688 12/11/2017    Lab Results  Component Value Date   CREATININE 0.91 04/19/2019   BUN 11 04/19/2019   NA 134 (L) 04/19/2019   K 3.7 04/19/2019   CL 96 04/19/2019   CO2 29 04/19/2019   Lab Results  Component Value Date   ALT 18 04/19/2019   AST 19 04/19/2019   ALKPHOS 53 04/19/2019   BILITOT 0.7 04/19/2019   Lab Results  Component Value Date   CHOL 168 05/29/2019   Lab Results  Component Value Date   HDL 57.50 05/29/2019   Lab Results  Component Value Date   LDLCALC 88 05/29/2019   Lab Results   Component Value Date   TRIG 111.0 05/29/2019   Lab Results  Component Value Date   CHOLHDL 3 05/29/2019   Lab Results  Component Value Date   HGBA1C 5.7 02/19/2019   IMPRESSION AND PLAN:  1) HTN: The current medical regimen is effective;  continue present plan and medications. Lytes/cr today.  2) Prediabetes. Needs to increase efforts at [facility]. A1c and non-fasting glucose today.  3) Bipolar disorder + GAD: she is functioning fine. No prolonged mood dip or elevation. Chronic anxiety fairly well controlled, w/out panic. Encouraged increased exercise as natural antidepressant/antianxiety. Continue lamictal 300 mg qd, citalopram 20mg  bid, and xanax 1 tid. CSC and UDS UTD. No new rx's needed today.  4) Normocytic anemia, chronic.  Anemia of chronic dz suspected. Recheck CBC today. Add iron if Hb <11.5 or MCV trending down.  5) Hx of recurrent kidney stones. Recent [facility]rol eval at [facility] and then 2nd opinion at Dr. Shann Medal office [facility]). [facility] CT showed no prob with kidney or remainder of urinary tract.   Pt asymptomatic. Need records so I'll request these.  An After Visit Summary was printed and given to the patient.  FOLLOW UP: Return in about 6 months (around 02/13/2020) for annual CPE (fasting).  Signed:  Crissie Sickles,  MD           08/13/2019

## 2019-08-16 ENCOUNTER — Other Ambulatory Visit: Payer: Self-pay | Admitting: Family Medicine

## 2019-08-23 ENCOUNTER — Other Ambulatory Visit: Payer: Self-pay

## 2019-08-23 ENCOUNTER — Other Ambulatory Visit: Payer: Self-pay | Admitting: Obstetrics & Gynecology

## 2019-08-23 ENCOUNTER — Encounter: Payer: Self-pay | Admitting: Obstetrics & Gynecology

## 2019-08-23 ENCOUNTER — Ambulatory Visit (INDEPENDENT_AMBULATORY_CARE_PROVIDER_SITE_OTHER): Payer: Medicare Other | Admitting: Obstetrics & Gynecology

## 2019-08-23 VITALS — BP 130/70 | Ht 66.0 in | Wt 182.0 lb

## 2019-08-23 DIAGNOSIS — M85852 Other specified disorders of bone density and structure, left thigh: Secondary | ICD-10-CM

## 2019-08-23 DIAGNOSIS — Z780 Asymptomatic menopausal state: Secondary | ICD-10-CM | POA: Diagnosis not present

## 2019-08-23 DIAGNOSIS — E663 Overweight: Secondary | ICD-10-CM

## 2019-08-23 DIAGNOSIS — Z01419 Encounter for gynecological examination (general) (routine) without abnormal findings: Secondary | ICD-10-CM | POA: Diagnosis not present

## 2019-08-23 DIAGNOSIS — M8588 Other specified disorders of bone density and structure, other site: Secondary | ICD-10-CM

## 2019-08-23 NOTE — Patient Instructions (Signed)
1. Encounter for routine gynecological examination with Papanicolaou smear of cervix Normal gynecologic exam.  Pap reflex done.  Breast exam normal.  Will schedule a screening mammogram now.  Schedule colonoscopy.  Health labs with family physician.  2. Postmenopause Well on no hormone replacement therapy.  No postmenopausal bleeding.  3. Osteopenia of neck of left femur Osteopenia on bone density June 2019.  We will schedule a repeat bone density at the breast center.  Vitamin D supplements, calcium intake of 1200 to 1500 mg daily and regular weightbearing physical activity is recommended. - DG Bone Density; Future  4. Overweight (BMI 25.0-29.9) Recommend a lower calorie/carb diet.  Aerobic activities 5 times a week and light weightlifting every 2 days.  Erin Lopez, it was a pleasure seeing you today!  I will inform you of your results as soon as they are available.

## 2019-08-23 NOTE — Progress Notes (Signed)
Erin Lopez Apr 10, 1950 [id number]   History:    69 y.o. G3P1A2L1 Single  RP:  Established patient presenting for annual gyn exam   HPI: Menopause, well on no HRT.  No PMB.  No pelvic pain.  Abstinent.  Breasts wnl.  Urine/BMs normal.  BMI 29.38.  Will start back exercising and decrease calories. Bipolar disorder, well currently on Celexa and Lamictal.  Health labs Fam MD.   Past medical history,surgical history, family history and social history were all reviewed and documented in the EPIC chart.  Gynecologic History No LMP recorded. Patient is postmenopausal.  Obstetric History OB History  Gravida Para Term Preterm AB Living  3 1       1   SAB TAB Ectopic Multiple Live Births               # Outcome Date GA Lbr Len/2nd Weight Sex Delivery Anes PTL Lv  3 Gravida           2 Gravida           1 Para              ROS: A ROS was performed and pertinent positives and negatives are included in the history.  GENERAL: No fevers or chills. HEENT: No change in vision, no earache, sore throat or sinus congestion. NECK: No pain or stiffness. CARDIOVASCULAR: No chest pain or pressure. No palpitations. PULMONARY: No shortness of breath, cough or wheeze. GASTROINTESTINAL: No abdominal pain, nausea, vomiting or diarrhea, melena or bright red blood per rectum. GENITOURINARY: No urinary frequency, urgency, hesitancy or dysuria. MUSCULOSKELETAL: No joint or muscle pain, no back pain, no recent trauma. DERMATOLOGIC: No rash, no itching, no lesions. ENDOCRINE: No polyuria, polydipsia, no heat or cold intolerance. No recent change in weight. HEMATOLOGICAL: No anemia or easy bruising or bleeding. NEUROLOGIC: No headache, seizures, numbness, tingling or weakness. PSYCHIATRIC: No depression, no loss of interest in normal activity or change in sleep pattern.     Exam:   BP 130/70   Ht 5\' 6"  (1.676 m)   Wt 182 lb (82.6 kg)   BMI 29.38 kg/m   Body mass index is 29.38 kg/m.  General  appearance : Well developed well nourished female. No acute distress HEENT: Eyes: no retinal hemorrhage or exudates,  Neck supple, trachea midline, no carotid bruits, no thyroidmegaly Lungs: Clear to auscultation, no rhonchi or wheezes, or rib retractions  Heart: Regular rate and rhythm, no murmurs or gallops Breast:Examined in sitting and supine position were symmetrical in appearance, no palpable masses or tenderness,  no skin retraction, no nipple inversion, no nipple discharge, no skin discoloration, no axillary or supraclavicular lymphadenopathy Abdomen: no palpable masses or tenderness, no rebound or guarding Extremities: no edema or skin discoloration or tenderness  Pelvic: Vulva: Normal             Vagina: No gross lesions or discharge  Cervix: No gross lesions or discharge.  Pap reflex done.  Uterus  AV, normal size, shape and consistency, non-tender and mobile  Adnexa  Without masses or tenderness  Anus: Normal   Assessment/Plan:  69 y.o. female for annual exam   1. Encounter for routine gynecological examination with Papanicolaou smear of cervix Normal gynecologic exam.  Pap reflex done.  Breast exam normal.  Will schedule a screening mammogram now.  Schedule colonoscopy.  Health labs with family physician.  2. Postmenopause Well on no hormone replacement therapy.  No postmenopausal bleeding.  3. Osteopenia of neck  of left femur Osteopenia on bone density June 2019.  We will schedule a repeat bone density at the breast center.  Vitamin D supplements, calcium intake of 1200 to 1500 mg daily and regular weightbearing physical activity is recommended. - DG Bone Density; Future  4. Overweight (BMI 25.0-29.9) Recommend a lower calorie/carb diet.  Aerobic activities 5 times a week and light weightlifting every 2 days.  Princess Bruins MD, 2:06 PM 08/23/2019

## 2019-08-23 NOTE — Addendum Note (Signed)
Addended by: Thurnell Garbe A on: 08/23/2019 04:13 PM   Modules accepted: Orders

## 2019-08-25 ENCOUNTER — Other Ambulatory Visit: Payer: Self-pay | Admitting: Family Medicine

## 2019-08-26 LAB — PAP IG W/ RFLX HPV ASCU

## 2019-08-26 NOTE — Telephone Encounter (Signed)
Refill not needed at this time. Last p/u was 4/23 #270. Ok to refuse

## 2019-09-09 ENCOUNTER — Other Ambulatory Visit: Payer: Self-pay | Admitting: Family Medicine

## 2019-09-12 ENCOUNTER — Other Ambulatory Visit: Payer: Self-pay | Admitting: Family Medicine

## 2019-09-13 ENCOUNTER — Telehealth: Payer: Self-pay

## 2019-09-13 NOTE — Telephone Encounter (Signed)
Patient wants to speak to a nurse about her medication. She did not want to go into details  Please call [phone].  Thank you

## 2019-09-13 NOTE — Telephone Encounter (Signed)
Tell her I'm sorry but as per her controlled substance contract I cannot replace lost prescriptions or loss pill bottles/lost pills.

## 2019-09-13 NOTE — Telephone Encounter (Signed)
Patient returned call and is unable to find Rx bottle for alprazolam. She only has 6-7 tabs left and takes three times daily. She is asking if anything can be sent.  Last refill was 04/25/19 (270,1)

## 2019-09-13 NOTE — Telephone Encounter (Signed)
LM for pt to returncall

## 2019-09-16 ENCOUNTER — Other Ambulatory Visit: Payer: Self-pay

## 2019-09-16 MED ORDER — ALPRAZOLAM 1 MG PO TABS: 1.0000 mg | ORAL_TABLET | Freq: Three times a day (TID) | ORAL | 0 refills | Status: DC

## 2019-09-16 NOTE — Telephone Encounter (Signed)
Patient was contacted and advised 3 day supply could be sent but appointment would be needed to discuss medication. Appt scheduled for 6/17 and medication faxed to pharmacy.

## 2019-09-16 NOTE — Telephone Encounter (Signed)
Tell her I'm so sorry but I have no other choice.

## 2019-09-16 NOTE — Telephone Encounter (Signed)
Patient was advised no other controlled medications could be prescribed to replace this without appt or not on CSC. She is afraid and panicking.  Please advise if any other suggestions.

## 2019-09-18 ENCOUNTER — Other Ambulatory Visit: Payer: Self-pay

## 2019-09-18 MED ORDER — ALPRAZOLAM 1 MG PO TABS: 1.0000 mg | ORAL_TABLET | Freq: Three times a day (TID) | ORAL | 0 refills | Status: DC

## 2019-09-18 NOTE — Telephone Encounter (Signed)
Called patient's pharmacy and Rx unable to be filled without stating "ok to fill on/before certain date. New Rx faxed in with "ok to fill on 6/16". Patient was made aware Rx will be available today.

## 2019-09-18 NOTE — Telephone Encounter (Signed)
Please call patient [phone]

## 2019-09-19 ENCOUNTER — Encounter: Payer: Self-pay | Admitting: Family Medicine

## 2019-09-19 ENCOUNTER — Ambulatory Visit (INDEPENDENT_AMBULATORY_CARE_PROVIDER_SITE_OTHER): Payer: Medicare Other | Admitting: Family Medicine

## 2019-09-19 ENCOUNTER — Other Ambulatory Visit: Payer: Self-pay

## 2019-09-19 VITALS — BP 115/75 | HR 73 | Temp 98.2°F | Resp 16 | Ht 66.0 in | Wt 179.6 lb

## 2019-09-19 DIAGNOSIS — Z79899 Other long term (current) drug therapy: Secondary | ICD-10-CM

## 2019-09-19 DIAGNOSIS — F411 Generalized anxiety disorder: Secondary | ICD-10-CM | POA: Diagnosis not present

## 2019-09-19 NOTE — Progress Notes (Signed)
OFFICE VISIT  09/19/2019   CC:  Chief Complaint  Patient presents with  . Discuss alprazolam    currently taking for anxiety   HPI:    Patient is a 69 y.o. Caucasian female who presents for discuss alprazolam and anxiety.  She lost some alprazolam earlier this week. She gets 90d supplies of xanax, splits up these into 30d supply in one bottle and the rest remain in the original bottle.  After going through her 30 d supply, she then refilled this bottle with meds from her original bottle.  She started taking this med from this bottle and then from one day to the next could not find ANY of her xanax pills.  None of her other medications are gone/lost. She knows of 2 people who have keys to her home that she doesn't trust--one of her daughter's friends, and a neighbor.  After pt's meds disappeared she changed her locks. She has searched her entire home, car, etc. Also a friend's home who she visits but has not had her check her home. It is unclear if she checked her church, but she states she doesn't take her purse in her church anymore. She has never lost this med or had it stolen in the past. She has never demonstrated irresponsible use of this med or drug seeking behavior and I've never had any suspicion that she is diverting this med. She is open to trying to wean off the xanax.  PMP AWARE reviewed today: most recent rx for zolpidem 10mg  was filled 09/09/19, # 37, rx by me. Most recent alpraz 1mg  rx filled 07/26/19, #270, rx by me.  She states she did fill the #9 tabs that I rx'd yesterday. No red flags. CSC Utd. UDS UTD. Past Medical History:  Diagnosis Date  . Alcoholism in remission [facility])    states last alcohol 11 yrs ago  . Anxiety   . Asymptomatic gallstones    u/s 03/2019 (noted on imaging prior to that though)  . Bipolar 1 disorder ([place])    Admission for mania x 2 (most recent 11/2017)  . Cervical spondylosis without myelopathy 12/19/2013   MRI 02/2014: multilevel  DDD/spondylosis, not much change compared to prior MRI.  Pt not in favor of invasive therapy for her neck as of 04/2014.  Marland Kitchen COPD (chronic obstructive pulmonary disease) (HCC)    Moderate: anoro started 04/2017 by pulm, pt symptomatically improved and PFTs stable at f/u 06/2017.  Marland Kitchen Coronary artery spasm ([place]) 07/11/2018   nitrates=HA. Amlodipine=intol swelling.  Changed to coreg 09/03/18 by cardiologist.  . Dilutional hyponatremia   . Endometritis 05/2017   possible; empiric tx with Flagyl by GYN.  . Environmental allergies   . Fibromyalgia   . GERD (gastroesophageal reflux disease)   . Hepatic steatosis 03/2019   u/s abd  . Herpes zoster 08/07/2017   L scapular region  . Hiatal hernia   . History of adenomatous polyp of colon 04/2008; 08/2014   No high grade dysplasia: recall 5 yrs ([name], [name])  . History of basal cell carcinoma excision    NOSE  . History of Helicobacter pylori infection 04/2008   +gastric biopsy (gastritis but no metaplasia, dysplasia, or malignancy identified)  . History of TIA (transient ischemic attack)    secondary to HELLP syndrome 1994 post c/s--  residual memory loss  . Hyperlipidemia    statin started after her NSTEMI: goal  LDL <70.  Marland Kitchen Hypertension   . Internal hemorrhoids   . Ischemic  cardiomyopathy 05/2018   Echo EF 45-50% in context of NSTEMI from CA spasm.  . 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 04/2017.  . Memory loss    Abnl MRI brain and CT brain c/w chronic microvascular ischemia.  Repeat MRI brain 02/2014 stable (Dr. Mayra Reel with no plan to f/u with neuro as of 04/2014)  . NSTEMI (non-ST elevated myocardial infarction) ([place]) 05/2018   Echo EF 45-50% in context of NSTEMI from CA spasm.  . Osteoporosis 05/2010   2012 'penia; 06/2015 'porosis:  prolia.   DEXA 09/2017 T-score -2.2 femur neck.  As of 11/2018 we're restarting prolia soon and will check next DEXA 1 yr from her next prolia injection.  . Pelvic floor dysfunction    [facility] ([name])  . Postmenopausal vaginal bleeding 05/2017   x 1 small episode; GYN attempted endo bx but unable to penetrate cervix due to severe cerv stenosis (due to postmenopausal state + hx of LEEP).  Endo u/s showed thin endo lining.  Per GYN---no evidence of endomet pathology on w/u---obs as of 07/2017.  Marland Kitchen Prediabetes 06/2016   Fasting gluc 105; HbA1c at that time was 6.0%.  A1c 6.2% 12/2017.  Marland Kitchen Recurrent kidney stones 08/2013   Left ureteral calculus: Perc nephr--cystoscopy w/ureteroscopy + laser for stone removal.  Residual asymptomatic left renal nephrolithiasis <49mm present post-procedure.  Right sided hydronephrosis-persistent (on u/s)--urol ordered CT to further eval 08/2016: 1.6 cm nonobstructing stone lower pole left kidney, no hydro, plan for PCN extraction ([facility])  . Shortness of breath   . Sprain of neck 12/19/2013  . TMJ (temporomandibular joint disorder)    USES  MOUTH GUARD  . Toe fracture, left 12/2017   2nd toe prox phalanx (immobilization, [name])  . Vitamin D deficiency 05/2011  . Weak urinary stream 07/2016   with elevated PVR and mild left hydronephrosis secondary to this ([facility] started flomax 0.4mg  qd for this).    Past Surgical History:  Procedure Laterality Date  . ANTERIOR CERVICAL DECOMP/DISCECTOMY FUSION  06-30-2009   C3 -- C5  AND EXPLORATION OF FUSION C5-7 W/  PLATE REMOVAL  . BUNIONECTOMY Left   . CERVICAL FUSION  2002   anterior C5 -- C7  . CERVICAL SPINE SURGERY  08-27-1999     C6 -- T1  LAMINECTOMY/  DISKECTOMY  . CESAREAN SECTION  1994  . COLONOSCOPY W/ POLYPECTOMY  04/2008;08/2014   2016 tubular adenoma x 1; +diverticulosis and int/ext hemorrhoids.  Recall 5 yrs  . CYSTOSCOPY WITH URETEROSCOPY AND STENT PLACEMENT Left 08/28/2013   Procedure: CYSTOSCOPY,  RGP,  WITH URETEROSCOPY AND STENT REMOVAL;  Surgeon: [name], MD;  Location: [facility];  Service: Urology;  Laterality: Left;  . DEXA  09/2017   T-score -2.2 femur neck: improved compared to 06/2015.  Marland Kitchen ESOPHAGOGASTRODUODENOSCOPY  2010; 08/2014   +Candidal esophagitis; Mild chronic gastritis w/intestinal metaplasia--NEG H pylori, neg for eosinophilic esoph  . HOLMIUM LASER APPLICATION Left 1/61/0960   Procedure: HOLMIUM LASER APPLICATION;  Surgeon: [name], MD;  Location: [facility];  Service: Urology;  Laterality: Left;  . LEFT HEART CATH AND CORONARY ANGIOGRAPHY N/A 05/15/2018   Evidence for spasm in distal LAD.  Mod RCA atherosclerosis, o/w no CAD.  Procedure: LEFT HEART CATH AND CORONARY ANGIOGRAPHY;  Surgeon: Leonie Man, MD;  Location: [facility]  INVASIVE CV LAB;  Service: Cardiovascular;  Laterality: N/A;  . NEGATIVE SLEEP STUDY  04-01-2007  . NEPHROLITHOTOMY Left 08/05/2013   Procedure: NEPHROLITHOTOMY PERCUTANEOUS;  Surgeon: [name], MD;  Location: [facility];  Service: Urology;  Laterality: Left;  . POLYSOMNOGRAM  01/2019   NORMAL  . POLYSOMNOGRAPHY  10/25/2018   Dr. Dion Saucier due to pt inadequat sleep time (83 min).  . TONSILLECTOMY AND ADENOIDECTOMY  1975  . TOTAL KNEE ARTHROPLASTY Right 2003  . TRANSTHORACIC ECHOCARDIOGRAM  05/2018   (after MI secondary to arterial spasm): EF 45-50%; severe hypokinesis of apical anterolateral, inferolateral , anterior, and inferior LV myocardium.  Marland Kitchen ULTRASOUND EXAM,PELVIC COMPLETE ([facility] HX)  06/25/15   NORMAL (done by GYN)    Outpatient Medications Prior to Visit  Medication Sig Dispense Refill  . Acetaminophen 500 MG coapsule Take 650 mg by mouth every 6 (six) hours as needed for mild pain or headache.     . ALPRAZolam (XANAX) 1 MG tablet Take 1 tablet (1 mg total) by mouth 3 (three) times daily. 9 tablet 0  . ANORO ELLIPTA 62.5-25 MCG/INH AEPB INHALE 1 PUFF BY MOUTH EVERY DAY 60  each 11  . aspirin EC 81 MG EC tablet Take 1 tablet (81 mg total) by mouth daily. 90 tablet 3  . atorvastatin (LIPITOR) 80 MG tablet Take 1 tablet (80 mg total) by mouth daily at 6 PM. 90 tablet 3  . carvedilol (COREG) 25 MG tablet TAKE 1 TABLET (25 MG TOTAL) BY MOUTH 2 (TWO) TIMES DAILY WITH A MEAL. 180 tablet 1  . citalopram (CELEXA) 20 MG tablet TAKE 1 TABLET BY MOUTH TWICE A DAY 180 tablet 1  . cyclobenzaprine (FLEXERIL) 10 MG tablet TAKE 1 TABLET BY MOUTH TWICE A DAY 60 tablet 6  . doxycycline (VIBRAMYCIN) 50 MG capsule Take 50 mg by mouth daily.   3  . famotidine (PEPCID) 40 MG tablet Take 1 tablet (40 mg total) by mouth daily. 30 tablet 11  . fluticasone (FLONASE) 50 MCG/ACT nasal spray SPRAY 2 SPRAYS INTO EACH NOSTRIL EVERY DAY 48 mL 2  . hydrochlorothiazide (HYDRODIURIL) 25 MG tablet TAKE 1 TABLET BY MOUTH EVERY DAY 90 tablet 0  . lamoTRIgine (LAMICTAL) 100 MG tablet TAKE 3 TABLETS (300 MG TOTAL) BY MOUTH AT BEDTIME. 270 tablet 0  . Magnesium 250 MG TABS Take 1 tablet by mouth daily.    . montelukast (SINGULAIR) 10 MG tablet TAKE 1 TABLET BY MOUTH EVERY DAY IN THE EVENING 90 tablet 3  . Multiple Vitamin (MULTI-VITAMINS) TABS Take 1 tablet by mouth daily.     . nitroGLYCERIN (NITROSTAT) 0.4 MG SL tablet Place 1 tablet (0.4 mg total) under the tongue every 5 (five) minutes x 3 doses as needed for chest pain. 25 tablet 3  . polyethylene glycol powder (GLYCOLAX/MIRALAX) powder TAKE 17 G BY MOUTH DAILY. 3162 g 0  . potassium citrate (UROCIT-K) 10 MEQ (1080 MG) SR tablet Take 10 mEq by mouth 2 (two) times a day.     Marland Kitchen Propylene Glycol (SYSTANE COMPLETE) 0.6 % SOLN Apply 1 drop to eye 2 (two) times daily.    . tamsulosin (FLOMAX) 0.4 MG CAPS capsule Take 0.4 mg by mouth daily.    Marland Kitchen tretinoin (RETIN-A) 0.025 % cream APPLY TO FACE AT BEDTIME AS NEEDED    . triamcinolone cream (KENALOG) 0.1 % Apply 1 application topically daily.    . TURMERIC PO Take 1 tablet by mouth 2 (two) times a day.    Marland Kitchen  zolpidem (AMBIEN) 10 MG tablet TAKE 1 TABLET BY MOUTH EVERYDAY AT BEDTIME 90 tablet 1  . losartan (COZAAR) 50 MG tablet Take 1 tablet (50 mg total) by mouth daily. (Patient taking differently: Take 50 mg by mouth daily. Pt is taking 2 tablets daily.) 90 tablet 3   No facility-administered medications prior to visit.    Allergies  Allergen Reactions  . Ceftin [Cefuroxime Axetil] Anaphylaxis    Swelling of eyes and tongue  . Ciprofloxacin Anaphylaxis    Difficulty breathing and generalized swelling  . Fosamax [Alendronate Sodium] Diarrhea  . Morphine And Related Other (See Comments)    Hallucinations/ disoriented - pt stated, "Only Morphine" - 03/23/17  . Prednisone Other (See Comments)    Hyperactivity  . Seroquel [Quetiapine] Other (See Comments)    oversedation    ROS As per HPI  PE: Vitals with BMI 09/19/2019 08/23/2019 08/13/2019  Height 5\' 6"  5\' 6"  5' 7.5"  Weight 179 lbs 10 oz 182 lbs 183 lbs 10 oz  BMI 29 54.62 70.35  Systolic 009 381 829  Diastolic 75 70 70  Pulse 73 - 68    Gen: Alert, well appearing.  Patient is oriented to person, place, time, and situation. AFFECT: pleasant, lucid thought and speech. NO further exam today.  LABS:    Chemistry      Component Value Date/Time   NA 130 (L) 08/13/2019 1158   NA 142 09/25/2018 1007   K 3.6 08/13/2019 1158   CL 94 (L) 08/13/2019 1158   CO2 29 08/13/2019 1158   BUN 11 08/13/2019 1158   BUN 11 09/25/2018 1007   CREATININE 0.91 08/13/2019 1158   CREATININE 1.27 (H) 05/16/2016 1525      Component Value Date/Time   CALCIUM 9.3 08/13/2019 1158   ALKPHOS 53 04/19/2019 1230   AST 19 04/19/2019 1230   ALT 18 04/19/2019 1230   BILITOT 0.7 04/19/2019 1230   BILITOT 0.7 09/25/2018 1007      IMPRESSION AND PLAN:  1) GAD, high risk med use, lost med. We reviewed [facility] rules in detail again. As stated in HPI above:  She has never lost this med or had it stolen in the past. She has never demonstrated irresponsible  use of this med or drug seeking behavior and I've never had any suspicion that she is diverting this med. Additionally, she is open to trying to wean off the xanax. We'll do UDS today (should show alprazolam).  She also takes ambien nightly. I want her to check her girlfriend's home and her church---these are the last 2 possible places she could have accidentally left the alprazolam. Next step is to start wean off alprazolam.  An After Visit Summary was printed and given to the patient.  FOLLOW UP: Return for f/u to be determined based on results of UDS, etc.  Signed:  Crissie Sickles, MD           09/19/2019

## 2019-09-20 ENCOUNTER — Telehealth: Payer: Self-pay

## 2019-09-20 MED ORDER — ALPRAZOLAM 1 MG PO TABS: 1.0000 mg | ORAL_TABLET | Freq: Three times a day (TID) | ORAL | 0 refills | Status: DC

## 2019-09-20 NOTE — Telephone Encounter (Signed)
OK. #15 alpraz tabs eRx'd.

## 2019-09-20 NOTE — Addendum Note (Signed)
Addended by: Tammi Sou on: 09/20/2019 04:58 PM   Modules accepted: Orders

## 2019-09-20 NOTE — Telephone Encounter (Signed)
Pt was called and given information, she verbalized understanding  

## 2019-09-20 NOTE — Telephone Encounter (Signed)
Patient advised if drug screen results are not back by the end of today, Dr.McGowen would send in the appropriate amount to last thru the weekend.

## 2019-09-20 NOTE — Telephone Encounter (Signed)
Patient called in requesting to speak with [name]. Let her know he is in patient care this morning, she wants to let him know she will be out of her medication Saturday and wants to know what does he want her to do. Patient is requesting a call back today.   ALPRAZolam (XANAX) 1 MG tablet   Please call and advise

## 2019-09-22 LAB — DRUG MONITORING, PANEL 8 WITH CONFIRMATION, URINE
6 Acetylmorphine: NEGATIVE ng/mL (ref <10)
Alcohol Metabolites: NEGATIVE ng/mL
Alphahydroxyalprazolam: 245 ng/mL — ABNORMAL HIGH (ref <25)
Alphahydroxymidazolam: NEGATIVE ng/mL (ref <50)
Alphahydroxytriazolam: NEGATIVE ng/mL (ref <50)
Aminoclonazepam: NEGATIVE ng/mL (ref <25)
Amphetamines: NEGATIVE ng/mL (ref <500)
Benzodiazepines: POSITIVE ng/mL — AB (ref <100)
Buprenorphine, Urine: NEGATIVE ng/mL (ref <5)
Cocaine Metabolite: NEGATIVE ng/mL (ref <150)
Creatinine: 65.8 mg/dL
Hydroxyethylflurazepam: NEGATIVE ng/mL (ref <50)
Lorazepam: NEGATIVE ng/mL (ref <50)
MDMA: NEGATIVE ng/mL (ref <500)
Marijuana Metabolite: NEGATIVE ng/mL (ref <20)
Nordiazepam: NEGATIVE ng/mL (ref <50)
Opiates: NEGATIVE ng/mL (ref <100)
Oxazepam: NEGATIVE ng/mL (ref <50)
Oxidant: NEGATIVE ug/mL
Oxycodone: NEGATIVE ng/mL (ref <100)
Temazepam: NEGATIVE ng/mL (ref <50)
pH: 8 (ref 4.5–9.0)

## 2019-09-22 LAB — DM TEMPLATE

## 2019-09-23 ENCOUNTER — Telehealth: Payer: Self-pay

## 2019-09-23 MED ORDER — ALPRAZOLAM 0.5 MG PO TABS: ORAL_TABLET | ORAL | 0 refills | Status: DC

## 2019-09-23 NOTE — Telephone Encounter (Signed)
Patient unable to pick up RX.  Results for UDS are back.  Please advise.

## 2019-09-23 NOTE — Telephone Encounter (Signed)
Patient called back into the office stating the pharmacy is needing the Rx quantity to be changeed or to raise the dosage. She states if someone could call the pharmacy and please work this out for her. She is also requesting a phone call from the nurse or the provider    Please call and advise

## 2019-09-23 NOTE — Telephone Encounter (Signed)
Pt was called and given information, she verbalized understanding. Pt wanted was upset that she was being weaned off medication and was very short and hung up the phone after telling her this was the safe and appropriate way to wean off medication

## 2019-09-23 NOTE — Telephone Encounter (Signed)
Patient called this morning to report that she was unable to pick up her  ALPRAZolam Duanne Moron) 1 MG tablet [[id number]  #15  She stated she told [name] that the "reason" for getting the prescription early had to be on the prescription per new controlled drug guidelines.   Patient is upset. Per patient request she asked to get a message back to [name].  Patient can be reached at [phone]

## 2019-09-23 NOTE — Telephone Encounter (Signed)
OK, I sent in rx for alprazolam but a lower dose ---1/2 mg and she'll tAke it 3 times per day.  This will be her dose for the next 1 month--this is the start of the process of weaning her off of this medication.  Needs f/u in office or virtual in 3-4 wks-thx

## 2019-09-30 ENCOUNTER — Other Ambulatory Visit: Payer: Self-pay | Admitting: Family Medicine

## 2019-10-02 ENCOUNTER — Encounter: Payer: Self-pay | Admitting: Family Medicine

## 2019-10-03 ENCOUNTER — Encounter: Payer: Self-pay | Admitting: Family Medicine

## 2019-10-04 MED ORDER — HYDROXYZINE HCL 25 MG PO TABS: ORAL_TABLET | ORAL | 1 refills | Status: DC

## 2019-10-04 NOTE — Telephone Encounter (Signed)
Continue current dosing of all meds, including xanax. I sent in a med called hydroxyzine that will help her increased anxiety as we slowly ween her off xanax.

## 2019-10-04 NOTE — Telephone Encounter (Signed)
Patient was contacted and given PCP recommendations.

## 2019-10-04 NOTE — Telephone Encounter (Signed)
Patient's last visit was 6/17 and is weaning off xanax. Original Rx was for 1mg  tid and new Rx is for 0.5mg  tid. She is experiencing severe anxiety and panic. Unsure what to do next. Next f/u appt was to be 3-4 weeks from 6/17 appt  Please advise, thanks.

## 2019-10-09 ENCOUNTER — Other Ambulatory Visit: Payer: Self-pay | Admitting: Cardiology

## 2019-10-19 NOTE — Progress Notes (Signed)
Cardiology Office Note:    Date:  10/21/2019   ID:  Erin Lopez, DOB 1951/03/03, MRN [id number]  PCP:  Erin Sou, MD  Cardiologist:  Erin Him, MD  Electrophysiologist:  None   Referring MD: Erin Sou, MD   Chief Complaint:  Follow-up (CAD, coronary vasospasm)    Patient Profile:    Erin Lopez is a 69 y.o. 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  She returns for follow up.  She is here alone.  She has really not felt well for a long time.  She has noticed worsening shortness of breath over the past year.  She feels short of breath just about any activity as well as at rest.  She saw pulmonology back in January and March.  She notes that she was given a short course of antibiotics without significant change.  Her rescue inhaler does improve her shortness of breath.  She has not had orthopnea, PND, leg swelling.  She does note chest tightness.  This feels somewhat like a band across her anterior chest.  She feels some discomfort radiating up in her abdomen as well.  She has symptoms with activity and at rest.  Her symptoms do not sound consistent with her previous angina.  She has not had syncope.  Of note, she does have bilateral lower extremity pain that occurs with activity and at rest.  Past Medical History:    Diagnosis Date  . Alcoholism in remission [facility])    states last alcohol 11 yrs ago  . Anxiety   . Asymptomatic gallstones    u/s 03/2019 (noted on imaging prior to that though)  . Bipolar 1 disorder ([place])    Admission for mania x 2 (most recent 11/2017)  . Cervical spondylosis without myelopathy 12/19/2013   MRI 02/2014: multilevel DDD/spondylosis, not much change compared to prior MRI.  Pt not in favor of invasive therapy for her neck as of 04/2014.  Marland Kitchen COPD (chronic obstructive pulmonary disease) (HCC)    Moderate: anoro started 04/2017 by pulm, pt symptomatically improved and PFTs stable at f/u 06/2017.  Marland Kitchen Coronary artery spasm ([place]) 07/11/2018   nitrates=HA. Amlodipine=intol swelling.  Changed to coreg 09/03/18 by cardiologist.  . Dilutional hyponatremia   . Endometritis 05/2017   possible; empiric tx with Flagyl by GYN.  . Environmental allergies   . Fibromyalgia   . GERD (gastroesophageal reflux disease)   . Hepatic steatosis 03/2019   u/s abd  . Herpes zoster 08/07/2017   L scapular region  . Hiatal hernia   . History of adenomatous polyp of colon 04/2008; 08/2014   No high grade dysplasia: recall 5 yrs (Erin Lopez, [facility])  . History of basal cell carcinoma excision    NOSE  . History of Helicobacter  pylori infection 04/2008   +gastric biopsy (gastritis but no metaplasia, dysplasia, or malignancy identified)  . History of TIA (transient ischemic attack)    secondary to HELLP syndrome 1994 post c/s--  residual memory loss  . Hyperlipidemia    statin started after her NSTEMI: goal  LDL <70.  Marland Kitchen Hypertension   . Internal hemorrhoids   . Ischemic cardiomyopathy 05/2018   Echo EF 45-50% in context of NSTEMI from CA spasm.  . 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  . Memory loss    Abnl MRI  brain and CT brain c/w chronic microvascular ischemia.  Repeat MRI brain 02/2014 stable (Erin Lopez with no plan to f/u with neuro as of 04/2014)  . NSTEMI (non-ST elevated myocardial infarction) ([place]) 05/2018   Echo EF 45-50% in context of NSTEMI from CA spasm.  . Osteoporosis 05/2010   2012 'penia; 06/2015 'porosis:  prolia.  DEXA 09/2017 T-score -2.2 femur neck.  As of 11/2018 we're restarting prolia soon and will check next DEXA 1 yr from her next prolia injection.  . Pelvic floor dysfunction    [facility] (Erin Lopez)  . Postmenopausal vaginal bleeding 05/2017   x 1 small episode; GYN attempted endo bx but unable to penetrate cervix due to severe cerv stenosis (due to postmenopausal state + hx of LEEP).  Endo u/s showed thin endo lining.  Per GYN---no evidence of endomet pathology on w/u---obs as of 07/2017.  Marland Kitchen Prediabetes 06/2016   Fasting gluc 105; HbA1c at that time was 6.0%.  A1c 6.2% 12/2017.  Marland Kitchen Recurrent kidney stones 08/2013   Left ureteral calculus: Perc nephr--cystoscopy w/ureteroscopy + laser for stone removal.  Residual asymptomatic left renal nephrolithiasis <67mm present post-procedure.  Right sided hydronephrosis-persistent (on u/s)--urol ordered CT to further eval 08/2016: 1.6 cm nonobstructing stone lower pole left kidney, no hydro, plan for PCN extraction ([facility])  . Shortness of breath   . Sprain of neck 12/19/2013  . TMJ (temporomandibular joint disorder)    USES  MOUTH GUARD  . Toe fracture, left 12/2017   2nd toe prox phalanx (immobilization, Erin Lopez)  . Vitamin D deficiency 05/2011  . Weak urinary stream 07/2016   with elevated PVR and mild left hydronephrosis secondary to this ([facility] started flomax 0.4mg  qd for this).    Current Medications: Current Meds  Medication Sig  . Acetaminophen 500 MG coapsule Take 650 mg by mouth every 6 (six) hours as needed for mild pain or headache.   . ALPRAZolam (XANAX) 0.5 MG tablet 1 tab po tid for anxiety  .  ANORO ELLIPTA 62.5-25 MCG/INH AEPB INHALE 1 PUFF BY MOUTH EVERY DAY  . aspirin EC 81 MG EC tablet Take 1 tablet (81 mg total) by mouth daily.  Marland Kitchen atorvastatin (LIPITOR) 80 MG tablet Take 1 tablet (80 mg total) by mouth daily at 6 PM.  . carvedilol (COREG) 25 MG tablet TAKE 1 TABLET (25 MG TOTAL) BY MOUTH 2 (TWO) TIMES DAILY WITH A MEAL.  . citalopram (CELEXA) 20 MG tablet TAKE 1 TABLET BY MOUTH TWICE A DAY  . cyclobenzaprine (FLEXERIL) 10 MG tablet TAKE 1 TABLET BY MOUTH TWICE A DAY  . doxycycline (VIBRAMYCIN) 50 MG capsule Take 50 mg by mouth daily.   . famotidine (PEPCID) 40 MG tablet Take 1 tablet (40 mg total) by mouth daily.  Marland Kitchen  fluticasone (FLONASE) 50 MCG/ACT nasal spray SPRAY 2 SPRAYS INTO EACH NOSTRIL EVERY DAY  . hydrochlorothiazide (HYDRODIURIL) 25 MG tablet TAKE 1 TABLET BY MOUTH EVERY DAY  . hydrOXYzine (ATARAX/VISTARIL) 25 MG tablet 1-2 tabs po tid prn anxiety  . lamoTRIgine (LAMICTAL) 100 MG tablet TAKE 3 TABLETS (300 MG TOTAL) BY MOUTH AT BEDTIME.  Marland Kitchen losartan (COZAAR) 25 MG tablet Take 1 tablet (25 mg total) by mouth daily.  . Magnesium 250 MG TABS Take 1 tablet by mouth daily.  . montelukast (SINGULAIR) 10 MG tablet TAKE 1 TABLET BY MOUTH EVERY DAY IN THE EVENING  . Multiple Vitamin (MULTI-VITAMINS) TABS Take 1 tablet by mouth daily.   . nitroGLYCERIN (NITROSTAT) 0.4 MG SL tablet Place 1 tablet (0.4 mg total) under the tongue every 5 (five) minutes x 3 doses as needed for chest pain.  . polyethylene glycol powder (GLYCOLAX/MIRALAX) powder TAKE 17 G BY MOUTH DAILY.  Marland Kitchen potassium citrate (UROCIT-K) 10 MEQ (1080 MG) SR tablet Take 10 mEq by mouth 2 (two) times a day.   Marland Kitchen Propylene Glycol (SYSTANE COMPLETE) 0.6 % SOLN Apply 1 drop to eye 2 (two) times daily.  . tamsulosin (FLOMAX) 0.4 MG CAPS capsule Take 0.4 mg by mouth daily.  Marland Kitchen tretinoin (RETIN-A) 0.025 % cream APPLY TO FACE AT BEDTIME AS NEEDED  . triamcinolone cream (KENALOG) 0.1 % Apply 1 application topically daily.  .  TURMERIC PO Take 1 tablet by mouth 2 (two) times a day.  . zolpidem (AMBIEN) 10 MG tablet TAKE 1 TABLET BY MOUTH EVERYDAY AT BEDTIME  . [DISCONTINUED] losartan (COZAAR) 50 MG tablet Take 1 tablet (50 mg total) by mouth daily. Please make overdue appt with Erin Lopez before anymore refills. 1st attempt     Allergies:   Ceftin [cefuroxime axetil], Ciprofloxacin, Fosamax [alendronate sodium], Morphine and related, Prednisone, and Seroquel [quetiapine]   Social History   Tobacco Use  . Smoking status: Former Smoker    Packs/day: 1.00    Years: 33.00    Pack years: 33.00    Types: Cigarettes    Quit date: 08/22/1993    Years since quitting: 26.1  . Smokeless tobacco: Never Used  Vaping Use  . Vaping Use: Never used  Substance Use Topics  . Alcohol use: No    Alcohol/week: 0.0 standard drinks    Comment: ALCOHOLIC IN REMISSION SINCE  2004  . Drug use: No     Family Hx: The patient's family history includes Alcohol abuse in her mother; Arthritis in her mother; Breast cancer in her mother; Colon polyps in her brother and sister; Diabetes in her father and mother; Esophageal cancer in her sister; Heart disease in her brother and father; Hyperlipidemia in her father and mother; Hypertension in her father and mother; Irritable bowel syndrome in her sister; Parkinsonism in her mother; Prostate cancer in her father.  Review of Systems  Constitutional: Negative for fever.  Gastrointestinal: Negative for hematochezia and melena.  Genitourinary: Negative for hematuria.     EKGs/Labs/Other Test Reviewed:    EKG:  EKG is  ordered today.  The ekg ordered today demonstrates normal sinus rhythm, heart rate 65, normal axis, no ST-T wave changes, QTC 422  Recent Labs: 02/19/2019: TSH 1.55 04/19/2019: ALT 18; NT-Pro BNP 149 08/13/2019: BUN 11; Creatinine, Ser 0.91; Hemoglobin 10.8; Platelets 225.0; Potassium 3.6; Sodium 130   Recent Lipid Panel Lab Results  Component Value Date/Time   CHOL 168  05/29/2019 01:58 PM   CHOL 152 09/25/2018 10:07 AM  TRIG 111.0 05/29/2019 01:58 PM   HDL 57.50 05/29/2019 01:58 PM   HDL 62 09/25/2018 10:07 AM   CHOLHDL 3 05/29/2019 01:58 PM   LDLCALC 88 05/29/2019 01:58 PM   LDLCALC 69 09/25/2018 10:07 AM   LDLDIRECT 144.1 05/29/2013 11:56 AM    Physical Exam:    VS:  BP 92/60   Pulse 65   Ht 5' 7.5" (1.715 m)   Wt 182 lb 9.6 oz (82.8 kg)   SpO2 91%   BMI 28.18 kg/m     Wt Readings from Last 3 Encounters:  10/21/19 182 lb 9.6 oz (82.8 kg)  09/19/19 179 lb 9.6 oz (81.5 kg)  08/23/19 182 lb (82.6 kg)     Constitutional:      Appearance: Healthy appearance. Not in distress.  Neck:     Thyroid: No thyromegaly.     Vascular: No JVR. JVD normal.  Pulmonary:     Effort: Pulmonary effort is normal.     Breath sounds: No wheezing. No rales.  Cardiovascular:     Normal rate. Regular rhythm. Normal S1. Normal S2.     Murmurs: There is no murmur.  Pulses:    Decreased pulses (pedal).  Edema:    Peripheral edema absent.  Abdominal:     Palpations: Abdomen is soft. There is no hepatomegaly.  Skin:    General: Skin is warm and dry.  Neurological:     Mental Status: Alert and oriented to person, place and time.     Cranial Nerves: Cranial nerves are intact.      ASSESSMENT & PLAN:    1. Coronary vasospasm (HCC) 2. Coronary artery disease involving native coronary artery of native heart with angina pectoris [facility]) She was admitted with a non-STEMI in February 2020. Cardiac catheterization demonstrated nonobstructive disease and significant spasm in the LAD which cleared with intracoronary nitroglycerin. She cannot tolerate nitrates due to headache nor amlodipine due to swelling. She has not really had recurrent symptoms reminiscent of her angina at the time of her myocardial infarction. However, she does note chest tightness that seems to be fairly persistent. She also has had associated shortness of breath. Her electrocardiogram  demonstrates no acute changes. I suspect all of her symptoms are related to COPD. However, I will arrange stress testing to rule out significant ischemia.  -Arrange Lexiscan Myoview  -Continue aspirin, atorvastatin, carvedilol, as needed nitroglycerin  -Follow-up with Erin Lopez via virtual visit after testing complete  3. Ischemic cardiomyopathy 4. Shortness of breath EF is 45-58 echocardiogram in February 2020. She had anterolateral, inferolateral, anterior and inferior hypokinesis. She does not appear to be volume overloaded on exam. As noted, I suspect her shortness of breath and chest discomfort are all related to COPD. However, I will have her undergo testing to include a BNP and follow-up echocardiogram. If her BNP is significantly elevated, I will switch her HCTZ to furosemide. If her ejection fraction is worse, we will need to further intensify her medical therapy.  -Continue carvedilol, losartan  -Obtain follow-up echocardiogram  -Arrange BMET, BNP  -Follow-up 4 weeks  5. Essential hypertension Blood pressure running somewhat low. Decrease losartan to 25 mg daily.  6. Pure hypercholesterolemia Recent LDL 88. She is on high intensity statin therapy. If her LDL continues to remain >70, we will need to consider switching to rosuvastatin versus adding ezetimibe.  7. Aortic atherosclerosis ([place]) 8. Claudication [facility]) She had a CT scan recently for follow-up on nephrolithiasis. This was done at [facility] in  March 2021. Results are reviewed from care everywhere. The study did demonstrate advanced aortoiliac atherosclerotic disease. Her pedal pulses are somewhat diminished. She has a lot of leg pain, bilaterally. Some of this seems to occur with exertion. She is a prior smoker. I will arrange ABIs and lower extremity arterial Dopplers for further evaluation. Refer to PV if significantly abnormal.  -ABIs/LE arterial US    Dispo:  Return in about 4 weeks (around 11/18/2019) for Follow up  after testing w/ Erin Lopez, via Telemedicine.   Medication Adjustments/Labs and Tests Ordered: Current medicines are reviewed at length with the patient today.  Concerns regarding medicines are outlined above.  Tests Ordered: Orders Placed This Encounter  Procedures  . Basic metabolic panel  . Pro b natriuretic peptide (BNP)  . MYOCARDIAL PERFUSION IMAGING  . EKG 12-Lead  . ECHOCARDIOGRAM COMPLETE  . VAS [place] LOWER EXTREMITY ARTERIAL DUPLEX  . VAS [place] ABI WITH/WO TBI   Medication Changes: Meds ordered this encounter  Medications  . losartan (COZAAR) 25 MG tablet    Sig: Take 1 tablet (25 mg total) by mouth daily.    Dispense:  90 tablet    Refill:  1    Signed, Richardson Dopp, PA-C  10/21/2019 10:47 AM    [facility] [place], [place],   [address] Phone: [phone]; Fax: [phone]

## 2019-10-21 ENCOUNTER — Ambulatory Visit: Payer: Medicare Other | Admitting: Physician Assistant

## 2019-10-21 ENCOUNTER — Encounter: Payer: Self-pay | Admitting: Physician Assistant

## 2019-10-21 ENCOUNTER — Other Ambulatory Visit: Payer: Self-pay

## 2019-10-21 VITALS — BP 92/60 | HR 65 | Ht 67.5 in | Wt 182.6 lb

## 2019-10-21 DIAGNOSIS — I10 Essential (primary) hypertension: Secondary | ICD-10-CM

## 2019-10-21 DIAGNOSIS — R0602 Shortness of breath: Secondary | ICD-10-CM | POA: Diagnosis not present

## 2019-10-21 DIAGNOSIS — I201 Angina pectoris with documented spasm: Secondary | ICD-10-CM | POA: Diagnosis not present

## 2019-10-21 DIAGNOSIS — E7800 Pure hypercholesterolemia, unspecified: Secondary | ICD-10-CM

## 2019-10-21 DIAGNOSIS — I700 Atherosclerosis of aorta: Secondary | ICD-10-CM

## 2019-10-21 DIAGNOSIS — I25119 Atherosclerotic heart disease of native coronary artery with unspecified angina pectoris: Secondary | ICD-10-CM | POA: Diagnosis not present

## 2019-10-21 DIAGNOSIS — I739 Peripheral vascular disease, unspecified: Secondary | ICD-10-CM

## 2019-10-21 DIAGNOSIS — I255 Ischemic cardiomyopathy: Secondary | ICD-10-CM

## 2019-10-21 MED ORDER — LOSARTAN POTASSIUM 25 MG PO TABS: 25.0000 mg | ORAL_TABLET | Freq: Every day | ORAL | 1 refills | Status: DC

## 2019-10-21 NOTE — Patient Instructions (Addendum)
Medication Instructions:  Your physician has recommended you make the following change in your medication:   1) Decrease Losartan to 25 mg, 1 tablet by mouth once a day  *If you need a refill on your cardiac medications before your next appointment, please call your pharmacy*  Lab Work: You will have labs drawn today: BMET/BNP  If you have labs (blood work) drawn today and your tests are completely normal, you will receive your results only by: Marland Kitchen MyChart Message (if you have MyChart) OR . A paper copy in the mail If you have any lab test that is abnormal or we need to change your treatment, we will call you to review the results.  Testing/Procedures: Your physician has requested that you have a lexiscan myoview. For further information please visit [email or web address]. Please follow instruction sheet, as given.  Your physician has requested that you have an echocardiogram. Echocardiography is a painless test that uses sound waves to create images of your heart. It provides your doctor with information about the size and shape of your heart and how well your heart's chambers and valves are working. This procedure takes approximately one hour. There are no restrictions for this procedure.  Your physician has requested that you have an ankle brachial index (ABI). During this test an ultrasound and blood pressure cuff are used to evaluate the arteries that supply the arms and legs with blood. Allow thirty minutes for this exam. There are no restrictions or special instructions.  Your physician has requested that you have a lower extremity arterial duplex. This test is an ultrasound of the arteries in the legs. It looks at arterial blood flow in the legs. Allow one hour for Lower and Upper Arterial scans. There are no restrictions or special instructions  Follow-Up: At [facility], you and your health needs are our priority.  As part of our continuing mission to provide you with exceptional  heart care, we have created designated Provider Care Teams.  These Care Teams include your primary Cardiologist (physician) and Advanced Practice Providers (APPs -  Physician Assistants and Nurse Practitioners) who all work together to provide you with the care you need, when you need it.  We recommend signing up for the patient portal called "MyChart".  Sign up information is provided on this After Visit Summary.  MyChart is used to connect with patients for Virtual Visits (Telemedicine).  Patients are able to view lab/test results, encounter notes, upcoming appointments, etc.  Non-urgent messages can be sent to your provider as well.   To learn more about what you can do with MyChart, go to [email or web address].    Your next appointment:   4 week(s) after all testing is done  The format for your next appointment:   Virtual Visit   Provider:   Fransico Him, MD

## 2019-10-22 ENCOUNTER — Other Ambulatory Visit: Payer: Self-pay

## 2019-10-22 DIAGNOSIS — I10 Essential (primary) hypertension: Secondary | ICD-10-CM

## 2019-10-22 LAB — BASIC METABOLIC PANEL
BUN/Creatinine Ratio: 10 — ABNORMAL LOW (ref 12–28)
BUN: 8 mg/dL (ref 8–27)
CO2: 27 mmol/L (ref 20–29)
Calcium: 9.7 mg/dL (ref 8.7–10.3)
Chloride: 93 mmol/L — ABNORMAL LOW (ref 96–106)
Creatinine, Ser: 0.82 mg/dL (ref 0.57–1.00)
GFR calc Af Amer: 86 mL/min/{1.73_m2} (ref >59)
GFR calc non Af Amer: 75 mL/min/{1.73_m2} (ref >59)
Glucose: 91 mg/dL (ref 65–99)
Potassium: 4.2 mmol/L (ref 3.5–5.2)
Sodium: 132 mmol/L — ABNORMAL LOW (ref 134–144)

## 2019-10-22 LAB — PRO B NATRIURETIC PEPTIDE: NT-Pro BNP: 89 pg/mL (ref 0–301)

## 2019-10-22 MED ORDER — HYDROCHLOROTHIAZIDE 12.5 MG PO TABS
12.5000 mg | ORAL_TABLET | Freq: Every day | ORAL | 1 refills | Status: DC
Start: 2019-10-22 — End: 2020-04-20

## 2019-10-22 MED ORDER — ALPRAZOLAM 0.5 MG PO TABS: ORAL_TABLET | ORAL | 1 refills | Status: DC

## 2019-10-22 NOTE — Telephone Encounter (Signed)
Alprazolam 0.5mg , #90, RF x 1. eRx'd. Pls have pt arrange appt for some time in the next 8 wks.-thx

## 2019-10-22 NOTE — Telephone Encounter (Signed)
Telephone note sent to PCP for review.

## 2019-10-22 NOTE — Telephone Encounter (Signed)
Patient requesting refill for alprazolam 0.5mg   Contract:02/19/19 UDS:09/19/19 Last Visit:09/19/19 Next Visit: advised to f/u 3-4 weeks Last Refill:09/23/19(90,0)  Please Advise. Medication pending

## 2019-10-23 NOTE — Telephone Encounter (Signed)
MyChart message sent advising refill sent in and to schedule f/u appt.

## 2019-10-29 ENCOUNTER — Other Ambulatory Visit: Payer: Self-pay

## 2019-10-29 ENCOUNTER — Ambulatory Visit
Admission: RE | Admit: 2019-10-29 | Discharge: 2019-10-29 | Disposition: A | Discharge status: Home / Self Care | Payer: Medicare Other | Source: Ambulatory Visit | Attending: Family Medicine | Admitting: Family Medicine

## 2019-10-29 ENCOUNTER — Ambulatory Visit: Payer: Medicare Other

## 2019-10-29 ENCOUNTER — Other Ambulatory Visit: Payer: Medicare Other

## 2019-10-29 ENCOUNTER — Ambulatory Visit
Admission: RE | Admit: 2019-10-29 | Discharge: 2019-10-29 | Disposition: A | Discharge status: Home / Self Care | Payer: Medicare Other | Source: Ambulatory Visit | Attending: Obstetrics & Gynecology | Admitting: Obstetrics & Gynecology

## 2019-10-29 DIAGNOSIS — Z1231 Encounter for screening mammogram for malignant neoplasm of breast: Secondary | ICD-10-CM

## 2019-10-29 DIAGNOSIS — M85852 Other specified disorders of bone density and structure, left thigh: Secondary | ICD-10-CM

## 2019-10-29 DIAGNOSIS — Z780 Asymptomatic menopausal state: Secondary | ICD-10-CM | POA: Diagnosis not present

## 2019-10-29 DIAGNOSIS — M8589 Other specified disorders of bone density and structure, multiple sites: Secondary | ICD-10-CM | POA: Diagnosis not present

## 2019-10-30 ENCOUNTER — Other Ambulatory Visit: Payer: Self-pay | Admitting: Family Medicine

## 2019-10-31 ENCOUNTER — Other Ambulatory Visit: Payer: Self-pay

## 2019-10-31 MED ORDER — FLUTICASONE PROPIONATE 50 MCG/ACT NA SUSP: NASAL | 2 refills | Status: DC

## 2019-11-01 ENCOUNTER — Other Ambulatory Visit: Payer: Self-pay | Admitting: Cardiology

## 2019-11-02 ENCOUNTER — Other Ambulatory Visit: Payer: Self-pay | Admitting: Cardiology

## 2019-11-05 DIAGNOSIS — Z03818 Encounter for observation for suspected exposure to other biological agents ruled out: Secondary | ICD-10-CM | POA: Diagnosis not present

## 2019-11-05 DIAGNOSIS — Z20822 Contact with and (suspected) exposure to covid-19: Secondary | ICD-10-CM | POA: Diagnosis not present

## 2019-11-06 ENCOUNTER — Telehealth (INDEPENDENT_AMBULATORY_CARE_PROVIDER_SITE_OTHER): Payer: Medicare Other | Admitting: Family Medicine

## 2019-11-06 ENCOUNTER — Encounter: Payer: Self-pay | Admitting: Family Medicine

## 2019-11-06 ENCOUNTER — Telehealth: Payer: Self-pay | Admitting: Pulmonary Disease

## 2019-11-06 DIAGNOSIS — J441 Chronic obstructive pulmonary disease with (acute) exacerbation: Secondary | ICD-10-CM

## 2019-11-06 MED ORDER — ALBUTEROL SULFATE HFA 108 (90 BASE) MCG/ACT IN AERS: 2.0000 | INHALATION_SPRAY | RESPIRATORY_TRACT | 1 refills | Status: DC | PRN

## 2019-11-06 MED ORDER — AZITHROMYCIN 250 MG PO TABS: ORAL_TABLET | ORAL | 0 refills | Status: AC

## 2019-11-06 NOTE — Telephone Encounter (Signed)
No change from PCP recommendations. Take Zpack as prescribed. Monitor O2 levels if sustaining <88% and does not recover with rest needs ED eval.

## 2019-11-06 NOTE — Telephone Encounter (Signed)
Patient contacted with recommendations from provider. Patient verbalized understanding of plan including going to the ED if her sats stay blow 88%. Patient reports her sats improve when she is up and walking, pulmonary toilet exercises encouraged. Patient will contact us if her symptoms do not improve.

## 2019-11-06 NOTE — Telephone Encounter (Signed)
Patient states that her oxygen has been ranging between 88% and 90% when she was resting today, laying down. She states that right now she is at 94% and does not wear oxygen. Patient has been coughing and increased shortness of breath since Monday morning with light green/ yellow sputum. Had Covid in May was Negative and had one done yesterday and it was Negative as well. Patient is scheduled for an appointment with Dr. Vaughan Browner on 12/26/19.  Beth please advise

## 2019-11-06 NOTE — Telephone Encounter (Signed)
Looking at patients chart looks like she had a Video visit this morning with her PCP. Directions from that visit below    ASSESSMENT AND PLAN:  Discussed the following assessment and plan:  Chronic COPD with acute exacerbation.  Patient is in no respiratory distress at this time.  Pulse oximetry low to mid 90s.  She has had Covid vaccine.  We explained that certainly possible that she still could have Covid with prior vaccine and also discussed the risk of false negative screening.  -Plenty of fluids and rest -We discussed prednisone use for her flare but she has been intolerant of oral prednisone previously -Refill Ventolin HFA 1 to 2 puffs every 4-6 hours as needed -Continue Anoro Ellipta inhaler -Start Zithromax.  She has intolerance to multiple other antibiotics. -Go to ER or follow-up promptly for any fever or worsening symptoms -She will consider repeat Covid testing in a few days if not improving    I discussed the assessment and treatment plan with the patient. The patient was provided an opportunity to ask questions and all were answered. The patient agreed with the plan and demonstrated an understanding of the instructions.  The patient was advised to call back or seek an in-person evaluation if the symptoms worsen or if the condition fails to improve as anticipated.     Carolann Littler, MD

## 2019-11-06 NOTE — Progress Notes (Signed)
Patient ID: Erin Lopez, female   DOB: [birth year], 69 y.o.   MRN: [id number]  This visit type was conducted due to national recommendations for restrictions regarding the COVID-19 pandemic in an effort to limit this patient's exposure and mitigate transmission in our community.    Virtual Visit via Video Note  I connected with [name] on 11/06/19 at 10:15 AM EDT by a video enabled telemedicine application and verified that I am speaking with the correct person using two identifiers.  Location patient: home Location provider:work or home office Persons participating in the virtual visit: patient, provider  I discussed the limitations of evaluation and management by telemedicine and the availability of in person appointments. The patient expressed understanding and agreed to proceed.   HPI:  Erin Lopez is a former smoker with history of COPD.  She states she had onset 2 days ago some cough, congestion, sore throat.  She went yesterday and [facility].  She has had Covid vaccine.  Her temperature has been around 99.  She does have some diffuse body aches.  No sick contacts.  Her cough has been productive of green sputum.  She takes Anoro inhaler daily.  Her rescue inhaler is out of date.  Pulse oximetry yesterday was 93% and after rest and deep breathing went up to 96%.  Denies any nausea or vomiting.  Her concern is that she has had pneumonia previously and wanting to prevent that.  She does have history of CAD, hypertension, history of TIA   ROS: See pertinent positives and negatives per HPI.  Past Medical History:  Diagnosis Date  . Alcoholism in remission [facility])    states last alcohol 11 yrs ago  . Anxiety   . Asymptomatic gallstones    u/s 03/2019 (noted on imaging prior to that though)  . Bipolar 1 disorder ([place])    Admission for mania x 2 (most recent 11/2017)  . Cervical spondylosis without myelopathy 12/19/2013   MRI 02/2014:  multilevel DDD/spondylosis, not much change compared to prior MRI.  Pt not in favor of invasive therapy for her neck as of 04/2014.  Marland Kitchen COPD (chronic obstructive pulmonary disease) (HCC)    Moderate: anoro started 04/2017 by pulm, pt symptomatically improved and PFTs stable at f/u 06/2017.  Marland Kitchen Coronary artery spasm ([place]) 07/11/2018   nitrates=HA. Amlodipine=intol swelling.  Changed to coreg 09/03/18 by cardiologist.  . Dilutional hyponatremia   . Endometritis 05/2017   possible; empiric tx with Flagyl by GYN.  . Environmental allergies   . Fibromyalgia   . GERD (gastroesophageal reflux disease)   . Hepatic steatosis 03/2019   u/s abd  . Herpes zoster 08/07/2017   L scapular region  . Hiatal hernia   . History of adenomatous polyp of colon 04/2008; 08/2014   No high grade dysplasia: recall 5 yrs ([name], [name])  . History of basal cell carcinoma excision    NOSE  . History of Helicobacter pylori infection 04/2008   +gastric biopsy (gastritis but no metaplasia, dysplasia, or malignancy identified)  . History of TIA (transient ischemic attack)    secondary to HELLP syndrome 1994 post c/s--  residual memory loss  . Hyperlipidemia    statin started after her NSTEMI: goal  LDL <70.  Marland Kitchen Hypertension   . Internal hemorrhoids   . Ischemic cardiomyopathy 05/2018   Echo EF 45-50% in context of NSTEMI from CA spasm.  . 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 04/2017.  . Memory loss    Abnl MRI brain and CT brain c/w chronic microvascular ischemia.  Repeat MRI brain 02/2014 stable (Dr. Mayra Reel with no plan to f/u with neuro as of 04/2014)  . NSTEMI (non-ST elevated myocardial infarction) ([place]) 05/2018   Echo EF 45-50% in context of NSTEMI from CA spasm.  . Osteoporosis 05/2010   2012 'penia; 06/2015 'porosis:   prolia.  DEXA 09/2017 T-score -2.2 femur neck.  As of 11/2018 we're restarting prolia soon and will check next DEXA 1 yr from her next prolia injection.  . Pelvic floor dysfunction    [facility] ([name])  . Postmenopausal vaginal bleeding 05/2017   x 1 small episode; GYN attempted endo bx but unable to penetrate cervix due to severe cerv stenosis (due to postmenopausal state + hx of LEEP).  Endo u/s showed thin endo lining.  Per GYN---no evidence of endomet pathology on w/u---obs as of 07/2017.  Marland Kitchen Prediabetes 06/2016   Fasting gluc 105; HbA1c at that time was 6.0%.  A1c 6.2% 12/2017.  Marland Kitchen Recurrent kidney stones 08/2013   Left ureteral calculus: Perc nephr--cystoscopy w/ureteroscopy + laser for stone removal.  Residual asymptomatic left renal nephrolithiasis <34mm present post-procedure.  Right sided hydronephrosis-persistent (on u/s)--urol ordered CT to further eval 08/2016: 1.6 cm nonobstructing stone lower pole left kidney, no hydro, plan for PCN extraction ([facility])  . Shortness of breath   . Sprain of neck 12/19/2013  . TMJ (temporomandibular joint disorder)    USES  MOUTH GUARD  . Toe fracture, left 12/2017   2nd toe prox phalanx (immobilization, [name])  . Vitamin D deficiency 05/2011  . Weak urinary stream 07/2016   with elevated PVR and mild left hydronephrosis secondary to this ([facility] started flomax 0.4mg  qd for this).    Past Surgical History:  Procedure Laterality Date  . ANTERIOR CERVICAL DECOMP/DISCECTOMY FUSION  06-30-2009   C3 -- C5  AND EXPLORATION OF FUSION C5-7 W/  PLATE REMOVAL  . BUNIONECTOMY Left   . CERVICAL FUSION  2002   anterior C5 -- C7  . CERVICAL SPINE SURGERY  08-27-1999     C6 -- T1  LAMINECTOMY/  DISKECTOMY  . CESAREAN SECTION  1994  . COLONOSCOPY W/ POLYPECTOMY  04/2008;08/2014   2016 tubular adenoma x 1; +diverticulosis and int/ext hemorrhoids.  Recall 5 yrs  . CYSTOSCOPY WITH URETEROSCOPY AND STENT PLACEMENT Left 08/28/2013   Procedure:  CYSTOSCOPY, RGP,  WITH URETEROSCOPY AND STENT REMOVAL;  Surgeon: [name], MD;  Location: [facility];  Service: Urology;  Laterality: Left;  . DEXA  09/2017   T-score -2.2 femur neck: improved compared to 06/2015.  Marland Kitchen ESOPHAGOGASTRODUODENOSCOPY  2010; 08/2014   +Candidal esophagitis; Mild chronic gastritis w/intestinal metaplasia--NEG H pylori, neg for eosinophilic esoph  . HOLMIUM LASER APPLICATION Left 0/24/0973   Procedure: HOLMIUM LASER APPLICATION;  Surgeon: [name], MD;  Location: [facility];  Service: Urology;  Laterality: Left;  . LEFT HEART CATH AND CORONARY ANGIOGRAPHY N/A 05/15/2018   Evidence for spasm in distal LAD.  Mod RCA atherosclerosis, o/w no CAD.  Procedure: LEFT HEART CATH AND CORONARY ANGIOGRAPHY;  Surgeon: Leonie Man, MD;  Location: [facility];  Service: Cardiovascular;  Laterality: N/A;  . NEGATIVE SLEEP STUDY  04-01-2007  . NEPHROLITHOTOMY Left 08/05/2013  Procedure: NEPHROLITHOTOMY PERCUTANEOUS;  Surgeon: [name], MD;  Location: [facility];  Service: Urology;  Laterality: Left;  . POLYSOMNOGRAM  01/2019   NORMAL  . POLYSOMNOGRAPHY  10/25/2018   Dr. Dion Saucier due to pt inadequat sleep time (83 min).  . TONSILLECTOMY AND ADENOIDECTOMY  1975  . TOTAL KNEE ARTHROPLASTY Right 2003  . TRANSTHORACIC ECHOCARDIOGRAM  05/2018   (after MI secondary to arterial spasm): EF 45-50%; severe hypokinesis of apical anterolateral, inferolateral , anterior, and inferior LV myocardium.  Marland Kitchen ULTRASOUND EXAM,PELVIC COMPLETE ([facility] H[facility])  06/25/15   NORMAL (done by GYN)    Family History  Problem Relation Age of Onset  . Alcohol abuse Mother   . Arthritis Mother   . Hypertension Mother   . Hyperlipidemia Mother   . Diabetes Mother   . Parkinsonism Mother   . Breast cancer Mother   . Prostate cancer Father   . Hypertension Father   . Hyperlipidemia Father   . Diabetes Father   . Heart disease Father   .  Esophageal cancer Sister   . Colon polyps Sister   . Colon polyps Brother   . Heart disease Brother        x 2  . Irritable bowel syndrome Sister     SOCIAL HX: Quit smoking reportedly 1995   Current Outpatient Medications:  .  Acetaminophen 500 MG coapsule, Take 650 mg by mouth every 6 (six) hours as needed for mild pain or headache. , Disp: , Rfl:  .  albuterol (VENTOLIN HFA) 108 (90 Base) MCG/ACT inhaler, Inhale 2 puffs into the lungs every 4 (four) hours as needed for wheezing or shortness of breath., Disp: 18 g, Rfl: 1 .  ALPRAZolam (XANAX) 0.5 MG tablet, 1 tab po tid for anxiety, Disp: 90 tablet, Rfl: 1 .  ANORO ELLIPTA 62.5-25 MCG/INH AEPB, INHALE 1 PUFF BY MOUTH EVERY DAY, Disp: 60 each, Rfl: 11 .  aspirin EC 81 MG EC tablet, Take 1 tablet (81 mg total) by mouth daily., Disp: 90 tablet, Rfl: 3 .  atorvastatin (LIPITOR) 80 MG tablet, Take 1 tablet (80 mg total) by mouth daily at 6 PM., Disp: 90 tablet, Rfl: 3 .  azithromycin (ZITHROMAX Z-PAK) 250 MG tablet, Take 2 tablets (500 mg) on  Day 1,  followed by 1 tablet (250 mg) once daily on Days 2 through 5., Disp: 6 each, Rfl: 0 .  carvedilol (COREG) 25 MG tablet, Take 1 tablet (25 mg total) by mouth 2 (two) times daily with a meal., Disp: 180 tablet, Rfl: 2 .  citalopram (CELEXA) 20 MG tablet, TAKE 1 TABLET BY MOUTH TWICE A DAY, Disp: 180 tablet, Rfl: 1 .  cyclobenzaprine (FLEXERIL) 10 MG tablet, TAKE 1 TABLET BY MOUTH TWICE A DAY, Disp: 60 tablet, Rfl: 6 .  doxycycline (VIBRAMYCIN) 50 MG capsule, Take 50 mg by mouth daily. , Disp: , Rfl: 3 .  famotidine (PEPCID) 40 MG tablet, TAKE 1 TABLET BY MOUTH EVERY DAY, Disp: 90 tablet, Rfl: 3 .  fluticasone (FLONASE) 50 MCG/ACT nasal spray, SPRAY 2 SPRAYS INTO EACH NOSTRIL EVERY DAY, Disp: 48 mL, Rfl: 2 .  hydrochlorothiazide (HYDRODIURIL) 12.5 MG tablet, Take 1 tablet (12.5 mg total) by mouth daily., Disp: 90 tablet, Rfl: 1 .  hydrOXYzine (ATARAX/VISTARIL) 25 MG tablet, 1-2 tabs po tid prn  anxiety, Disp: 60 tablet, Rfl: 1 .  lamoTRIgine (LAMICTAL) 100 MG tablet, TAKE 3 TABLETS (300 MG TOTAL) BY MOUTH AT BEDTIME., Disp: 270 tablet, Rfl: 0 .  losartan (COZAAR) 25 MG tablet, Take 1 tablet (25 mg total) by mouth daily., Disp: 90 tablet, Rfl: 1 .  Magnesium 250 MG TABS, Take 1 tablet by mouth daily., Disp: , Rfl:  .  montelukast (SINGULAIR) 10 MG tablet, TAKE 1 TABLET BY MOUTH EVERY DAY IN THE EVENING, Disp: 90 tablet, Rfl: 3 .  Multiple Vitamin (MULTI-VITAMINS) TABS, Take 1 tablet by mouth daily. , Disp: , Rfl:  .  nitroGLYCERIN (NITROSTAT) 0.4 MG SL tablet, Place 1 tablet (0.4 mg total) under the tongue every 5 (five) minutes x 3 doses as needed for chest pain., Disp: 25 tablet, Rfl: 3 .  polyethylene glycol powder (GLYCOLAX/MIRALAX) powder, TAKE 17 G BY MOUTH DAILY., Disp: 3162 g, Rfl: 0 .  potassium citrate (UROCIT-K) 10 MEQ (1080 MG) SR tablet, Take 10 mEq by mouth 2 (two) times a day. , Disp: , Rfl:  .  Propylene Glycol (SYSTANE COMPLETE) 0.6 % SOLN, Apply 1 drop to eye 2 (two) times daily., Disp: , Rfl:  .  tamsulosin (FLOMAX) 0.4 MG CAPS capsule, Take 0.4 mg by mouth daily., Disp: , Rfl:  .  tretinoin (RETIN-A) 0.025 % cream, APPLY TO FACE AT BEDTIME AS NEEDED, Disp: , Rfl:  .  triamcinolone cream (KENALOG) 0.1 %, Apply 1 application topically daily., Disp: , Rfl:  .  TURMERIC PO, Take 1 tablet by mouth 2 (two) times a day., Disp: , Rfl:  .  zolpidem (AMBIEN) 10 MG tablet, TAKE 1 TABLET BY MOUTH EVERYDAY AT BEDTIME, Disp: 90 tablet, Rfl: 1  EXAM:  VITALS per patient if applicable:  GENERAL: alert, oriented, appears well and in no acute distress  HEENT: atraumatic, conjunttiva clear, no obvious abnormalities on inspection of external nose and ears  NECK: normal movements of the head and neck  LUNGS: on inspection no signs of respiratory distress, breathing rate appears normal, no obvious gross SOB, gasping or wheezing  CV: no obvious cyanosis  MS: moves all visible  extremities without noticeable abnormality  PSYCH/NEURO: pleasant and cooperative, no obvious depression or anxiety, speech and thought processing grossly intact  ASSESSMENT AND PLAN:  Discussed the following assessment and plan:  Chronic COPD with acute exacerbation.  Patient is in no respiratory distress at this time.  Pulse oximetry low to mid 90s.  She has had Covid vaccine.  We explained that certainly possible that she still could have Covid with prior vaccine and also discussed the risk of false negative screening.  -Plenty of fluids and rest -We discussed prednisone use for her flare but she has been intolerant of oral prednisone previously -Refill Ventolin HFA 1 to 2 puffs every 4-6 hours as needed -Continue Anoro Ellipta inhaler -Start Zithromax.  She has intolerance to multiple other antibiotics. -Go to ER or follow-up promptly for any fever or worsening symptoms -She will consider repeat Covid testing in a few days if not improving     I discussed the assessment and treatment plan with the patient. The patient was provided an opportunity to ask questions and all were answered. The patient agreed with the plan and demonstrated an understanding of the instructions.   The patient was advised to call back or seek an in-person evaluation if the symptoms worsen or if the condition fails to improve as anticipated.     Carolann Littler, MD

## 2019-11-07 ENCOUNTER — Other Ambulatory Visit: Payer: Medicare Other

## 2019-11-07 ENCOUNTER — Encounter (HOSPITAL_COMMUNITY): Payer: Medicare Other

## 2019-11-07 ENCOUNTER — Other Ambulatory Visit (HOSPITAL_COMMUNITY): Payer: Medicare Other

## 2019-11-11 ENCOUNTER — Other Ambulatory Visit: Payer: Self-pay | Admitting: Family Medicine

## 2019-11-13 ENCOUNTER — Encounter (HOSPITAL_COMMUNITY): Payer: Medicare Other

## 2019-11-15 ENCOUNTER — Telehealth: Payer: Self-pay | Admitting: Pulmonary Disease

## 2019-11-15 DIAGNOSIS — J449 Chronic obstructive pulmonary disease, unspecified: Secondary | ICD-10-CM

## 2019-11-15 MED ORDER — IPRATROPIUM-ALBUTEROL 0.5-2.5 (3) MG/3ML IN SOLN: 3.0000 mL | Freq: Three times a day (TID) | RESPIRATORY_TRACT | 3 refills | Status: DC | PRN

## 2019-11-15 NOTE — Telephone Encounter (Signed)
Patient was seen on 8/3/21at [facility] and was tested for Covid came back Negative. She states zpak got rid of sore throat. She still has a cough and started coughing up thick yellow sputum this morning. She started getting worse about a week ago. She has been using rescue inhaler, Anoro, flonase spray. She is not on any nebulized medications. [facility] [address] and patient states if she needs nebulized meds she can get a machine from [facility] on [address] they just have to have something faxed to them at [phone].  Dr. Vaughan Browner please advise

## 2019-11-15 NOTE — Telephone Encounter (Signed)
Called and spoke with patient letting her that we were sending in RX for Duoneb medications and that I was faxing order for nebulizer machine to [facility] at [phone]. Told her to give the medication a try for about a week and if no changes or she felt worse to call and let us know. Patient expressed understanding. RX sent and fax sent. Nothing further needed at this time.

## 2019-11-15 NOTE — Telephone Encounter (Signed)
Okay to prescribe duo nebs 3 times a day as needed

## 2019-11-18 ENCOUNTER — Other Ambulatory Visit: Payer: Self-pay

## 2019-11-18 MED ORDER — CITALOPRAM HYDROBROMIDE 20 MG PO TABS: 20.0000 mg | ORAL_TABLET | Freq: Two times a day (BID) | ORAL | 0 refills | Status: DC

## 2019-11-18 NOTE — Telephone Encounter (Signed)
Pt celexa refilled for 3 months which should last her to her 6 month follow up in November.

## 2019-11-28 ENCOUNTER — Encounter (HOSPITAL_COMMUNITY): Payer: Medicare Other

## 2019-11-28 ENCOUNTER — Telehealth (HOSPITAL_COMMUNITY): Payer: Self-pay

## 2019-11-28 ENCOUNTER — Telehealth: Payer: Medicare Other | Admitting: Cardiology

## 2019-11-28 ENCOUNTER — Other Ambulatory Visit (HOSPITAL_COMMUNITY): Payer: Medicare Other

## 2019-11-28 NOTE — Telephone Encounter (Signed)
Attempted to contact the patient with her instructions for her stress test. Asked to call back with any questions. S.Doyel Mulkern EMTP

## 2019-11-29 ENCOUNTER — Other Ambulatory Visit: Payer: Self-pay

## 2019-11-29 ENCOUNTER — Ambulatory Visit (HOSPITAL_COMMUNITY)
Admission: RE | Admit: 2019-11-29 | Discharge: 2019-11-29 | Disposition: A | Discharge status: Home / Self Care | Payer: Medicare Other | Source: Ambulatory Visit | Attending: Internal Medicine | Admitting: Internal Medicine

## 2019-11-29 ENCOUNTER — Other Ambulatory Visit: Payer: Medicare Other

## 2019-11-29 DIAGNOSIS — I700 Atherosclerosis of aorta: Secondary | ICD-10-CM

## 2019-11-29 DIAGNOSIS — I739 Peripheral vascular disease, unspecified: Secondary | ICD-10-CM | POA: Diagnosis not present

## 2019-11-29 HISTORY — PX: OTHER SURGICAL HISTORY: SHX169

## 2019-12-02 ENCOUNTER — Encounter: Payer: Self-pay | Admitting: Physician Assistant

## 2019-12-02 ENCOUNTER — Other Ambulatory Visit: Payer: Medicare Other

## 2019-12-03 ENCOUNTER — Encounter: Payer: Self-pay | Admitting: Family Medicine

## 2019-12-03 ENCOUNTER — Ambulatory Visit (HOSPITAL_COMMUNITY): Payer: Medicare Other | Attending: Internal Medicine

## 2019-12-03 ENCOUNTER — Other Ambulatory Visit: Payer: Self-pay

## 2019-12-03 ENCOUNTER — Other Ambulatory Visit: Payer: Medicare Other | Admitting: *Deleted

## 2019-12-03 ENCOUNTER — Ambulatory Visit (HOSPITAL_BASED_OUTPATIENT_CLINIC_OR_DEPARTMENT_OTHER): Payer: Medicare Other

## 2019-12-03 DIAGNOSIS — I201 Angina pectoris with documented spasm: Secondary | ICD-10-CM | POA: Diagnosis not present

## 2019-12-03 DIAGNOSIS — R0602 Shortness of breath: Secondary | ICD-10-CM

## 2019-12-03 DIAGNOSIS — I25119 Atherosclerotic heart disease of native coronary artery with unspecified angina pectoris: Secondary | ICD-10-CM

## 2019-12-03 DIAGNOSIS — I255 Ischemic cardiomyopathy: Secondary | ICD-10-CM

## 2019-12-03 DIAGNOSIS — I10 Essential (primary) hypertension: Secondary | ICD-10-CM

## 2019-12-03 HISTORY — PX: CARDIOVASCULAR STRESS TEST: SHX262

## 2019-12-03 LAB — MYOCARDIAL PERFUSION IMAGING
LV dias vol: 73 mL (ref 46–106)
LV sys vol: 25 mL
Peak HR: 84 {beats}/min
Rest HR: 67 {beats}/min
SDS: 0
SRS: 0
SSS: 0
TID: 1.08

## 2019-12-03 LAB — BASIC METABOLIC PANEL
BUN/Creatinine Ratio: 9 — ABNORMAL LOW (ref 12–28)
BUN: 8 mg/dL (ref 8–27)
CO2: 28 mmol/L (ref 20–29)
Calcium: 9.6 mg/dL (ref 8.7–10.3)
Chloride: 96 mmol/L (ref 96–106)
Creatinine, Ser: 0.87 mg/dL (ref 0.57–1.00)
GFR calc Af Amer: 80 mL/min/{1.73_m2} (ref >59)
GFR calc non Af Amer: 70 mL/min/{1.73_m2} (ref >59)
Glucose: 89 mg/dL (ref 65–99)
Potassium: 3.8 mmol/L (ref 3.5–5.2)
Sodium: 135 mmol/L (ref 134–144)

## 2019-12-03 LAB — ECHOCARDIOGRAM COMPLETE
Area-P 1/2: 2.87 cm2
Height: 66 in
S' Lateral: 3.2 cm
Weight: 2912 oz

## 2019-12-03 MED ORDER — TECHNETIUM TC 99M TETROFOSMIN IV KIT
10.6000 | PACK | Freq: Once | INTRAVENOUS | Status: AC | PRN
  Administered 2019-12-03: 10.6 via INTRAVENOUS
  Filled 2019-12-03: qty 11

## 2019-12-03 MED ORDER — TECHNETIUM TC 99M TETROFOSMIN IV KIT
30.8000 | PACK | Freq: Once | INTRAVENOUS | Status: AC | PRN
  Administered 2019-12-03: 30.8 via INTRAVENOUS
  Filled 2019-12-03: qty 31

## 2019-12-03 MED ORDER — REGADENOSON 0.4 MG/5ML IV SOLN
0.4000 mg | Freq: Once | INTRAVENOUS | Status: AC
  Administered 2019-12-03: 0.4 mg via INTRAVENOUS

## 2019-12-06 ENCOUNTER — Encounter: Payer: Self-pay | Admitting: Family Medicine

## 2019-12-10 NOTE — Progress Notes (Signed)
Virtual Visit via Telephone Note   This visit type was conducted due to national recommendations for restrictions regarding the COVID-19 Pandemic (e.g. social distancing) in an effort to limit this patient's exposure and mitigate transmission in our community.  Due to her co-morbid illnesses, this patient is at least at moderate risk for complications without adequate follow up.  This format is felt to be most appropriate for this patient at this time.  All issues noted in this document were discussed and addressed.  A limited physical exam was performed with this format.  Please refer to the patient's chart for her consent to telehealth for [facility].    Evaluation Performed:  Follow-up visit  This visit type was conducted due to national recommendations for restrictions regarding the COVID-19 Pandemic (e.g. social distancing).  This format is felt to be most appropriate for this patient at this time.  All issues noted in this document were discussed and addressed.  No physical exam was performed (except for noted visual exam findings with Video Visits).  Please refer to the patient's chart (MyChart message for video visits and phone note for telephone visits) for the patient's consent to telehealth for [facility].  Date:  12/11/2019   ID:  Erin Lopez, DOB 08-27-1950, MRN [id number]  Patient Location:  Home  Provider location:   [place]  PCP:  Tammi Sou, MD  Cardiologist:  Fransico Him, MD  Electrophysiologist:  None   Chief Complaint:  HTN, lipids, CP, coronary vasospasm  History of Present Illness:    Erin Lopez is a 69 y.o. female who presents via audio/video conferencing for a telehealth visit today.   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 Following IC NTG, this reduced to approximately 30% diffuse disease, consistent with  vasospasm. There was proximal to mid RCA lesion as well that was 45% stenosed.   She is intolerant to nitrates due to headache so she was changed to Amlodipine.  She also has HTN, hyperlipidemia, COPD and GERD.  She also had a sleep study ordered because she was told she stopped breathing in her sleep when she was in the hospital.  Her sleep study was non diagnostic due to inadequate sleep time.  She underwent home sleep study which was normal with mild snoring.   She was seen in July by Richardson Dopp, PA and was complaining of SOB that improves with inhalers and sees Pulmonary for COPD.  At that [place] she complained of band like Chest tightness across her chest with some discomfort in her abdomen and had symptoms with activity and rest but were not like what she had with her angina in the past.  She underwent Lexiscan myoview which showed no ischemia.  2D echo showed normal heart function, and mild pulmonary HTN with PASP 36mmHg.  There was no significant valvular heart disease.  BNP was normal.  She is now back today for followup.   Since she was seen last she has felt better than last month.  She had problems with chest congestion and had been using a nebulizer which helped a lot and feels much better.  She still uses the nebulizer once daily.  She still occasionally has the band like tightness across her chest but she thinks it is related to gaining 20lbs.  She has chronic DOE and uses nebulizers and inhalers.  She denies any PND, orthopnea, LE edema, dizziness, palpitations or syncope. SHe is compliant with her meds and is tolerating meds with no SE.    The patient does not have symptoms concerning for COVID-19 infection (fever, chills, cough, or new shortness of breath).    Prior CV studies:   The following studies were reviewed today:  Leane Call and 2D echo  Past Medical History:  Diagnosis Date  . Alcoholism in remission [facility])    states last alcohol 11 yrs ago  . Anxiety   .  Asymptomatic gallstones    u/s 03/2019 (noted on imaging prior to that though)  . Bipolar 1 disorder ([place])    Admission for mania x 2 (most recent 11/2017)  . Cervical spondylosis without myelopathy 12/19/2013   MRI 02/2014: multilevel DDD/spondylosis, not much change compared to prior MRI.  Pt not in favor of invasive therapy for her neck as of 04/2014.  Marland Kitchen COPD (chronic obstructive pulmonary disease) (HCC)    Moderate: anoro started 04/2017 by pulm, pt symptomatically improved and PFTs stable at f/u 06/2017.  Marland Kitchen Coronary artery spasm ([place]) 07/11/2018   nitrates=HA. Amlodipine=intol swelling.  Changed to coreg 09/03/18 by cardiologist.  . Dilutional hyponatremia   . Endometritis 05/2017   possible; empiric tx with Flagyl by GYN.  . Environmental allergies   . Fibromyalgia   . GERD (gastroesophageal reflux disease)   . Hepatic steatosis 03/2019   u/s abd  . Herpes zoster 08/07/2017   L scapular region  . Hiatal hernia   . History of adenomatous polyp of colon 04/2008; 08/2014   No high grade dysplasia: recall 5 yrs ([name], [name])  . History of basal cell carcinoma excision    NOSE  . History of Helicobacter pylori infection 04/2008   +gastric biopsy (gastritis but no metaplasia, dysplasia, or malignancy identified)  . History of TIA (transient ischemic attack)    secondary to HELLP syndrome 1994 post c/s--  residual memory loss  . Hyperlipidemia    statin started after her NSTEMI: goal  LDL <70.  Marland Kitchen Hypertension   . Internal hemorrhoids   . Ischemic cardiomyopathy 05/2018   Echo EF 45-50% in context of NSTEMI from CA spasm. // Echo 8/21: 55-60, mild LVH, normal RVSF, RVSP 40, trivial MR  . 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 04/2017.  . Leg pain    ABIs 11/2019: normal   . Memory loss    Abnl MRI  brain and CT brain c/w chronic microvascular ischemia.  Repeat MRI brain 02/2014 stable (Dr. Mayra Reel with no plan to f/u with neuro as of 04/2014)  . NSTEMI (non-ST elevated myocardial infarction) ([place]) 05/2018   Echo EF 45-50% in context of NSTEMI from CA spasm.// Myoview 8/21: EF 66, no ischemia, low risk  . Osteoporosis 05/2010   2012 'penia; 06/2015 'porosis:  prolia.  DEXA 09/2017 T-score -2.2 femur neck.  As of 11/2018 we're restarting prolia soon and will check next DEXA 1 yr from her next prolia injection.  . Pelvic floor dysfunction    [facility] ([name])  . Postmenopausal vaginal bleeding 05/2017   x 1 small episode; GYN attempted endo bx but unable to penetrate cervix due to severe cerv stenosis (due to postmenopausal state + hx of LEEP).  Endo u/s showed thin endo lining.  Per GYN---no evidence of endomet  pathology on w/u---obs as of 07/2017.  Marland Kitchen Prediabetes 06/2016   Fasting gluc 105; HbA1c at that time was 6.0%.  A1c 6.2% 12/2017.  Marland Kitchen Recurrent kidney stones 08/2013   Left ureteral calculus: Perc nephr--cystoscopy w/ureteroscopy + laser for stone removal.  Residual asymptomatic left renal nephrolithiasis <60mm present post-procedure.  Right sided hydronephrosis-persistent (on u/s)--urol ordered CT to further eval 08/2016: 1.6 cm nonobstructing stone lower pole left kidney, no hydro, plan for PCN extraction ([facility])  . Shortness of breath   . Sprain of neck 12/19/2013  . TMJ (temporomandibular joint disorder)    USES  MOUTH GUARD  . Toe fracture, left 12/2017   2nd toe prox phalanx (immobilization, [name])  . Vitamin D deficiency 05/2011  . Weak urinary stream 07/2016   with elevated PVR and mild left hydronephrosis secondary to this ([facility] started flomax 0.4mg  qd for this).   Past Surgical History:  Procedure Laterality Date  . ABIs  11/29/2019   NORMAL  . ANTERIOR CERVICAL DECOMP/DISCECTOMY FUSION  06-30-2009   C3 -- C5  AND EXPLORATION OF FUSION C5-7 W/   PLATE REMOVAL  . BUNIONECTOMY Left   . CARDIOVASCULAR STRESS TEST  12/03/2019   myoc perf im: NORMAL  . CERVICAL FUSION  2002   anterior C5 -- C7  . CERVICAL SPINE SURGERY  08-27-1999     C6 -- T1  LAMINECTOMY/  DISKECTOMY  . CESAREAN SECTION  1994  . COLONOSCOPY W/ POLYPECTOMY  04/2008;08/2014   2016 tubular adenoma x 1; +diverticulosis and int/ext hemorrhoids.  Recall 5 yrs  . CYSTOSCOPY WITH URETEROSCOPY AND STENT PLACEMENT Left 08/28/2013   Procedure: CYSTOSCOPY, RGP,  WITH URETEROSCOPY AND STENT REMOVAL;  Surgeon: [name], MD;  [facility];  Service: Urology;  Laterality: Left;  . DEXA  09/2017   T-score -2.2 femur neck: improved compared to 06/2015.  Marland Kitchen ESOPHAGOGASTRODUODENOSCOPY  2010; 08/2014   +Candidal esophagitis; Mild chronic gastritis w/intestinal metaplasia--NEG H pylori, neg for eosinophilic esoph  . HOLMIUM LASER APPLICATION Left 09/29/3149   Procedure: HOLMIUM LASER APPLICATION;  Surgeon: [name], MD;  Location: [facility];  Service: Urology;  Laterality: Left;  . LEFT HEART CATH AND CORONARY ANGIOGRAPHY N/A 05/15/2018   Evidence for spasm in distal LAD.  Mod RCA atherosclerosis, o/w no CAD.  Procedure: LEFT HEART CATH AND CORONARY ANGIOGRAPHY;  Surgeon: Leonie Man, MD;  Location: [facility];  Service: Cardiovascular;  Laterality: N/A;  . NEGATIVE SLEEP STUDY  04-01-2007  . NEPHROLITHOTOMY Left 08/05/2013   Procedure: NEPHROLITHOTOMY PERCUTANEOUS;  Surgeon: [name], MD;  Location: [facility];  Service: Urology;  Laterality: Left;  . POLYSOMNOGRAM  01/2019   NORMAL  . POLYSOMNOGRAPHY  10/25/2018   Dr. Dion Saucier due to pt inadequat sleep time (83 min).  . TONSILLECTOMY AND ADENOIDECTOMY  1975  . TOTAL KNEE ARTHROPLASTY Right 2003  . TRANSTHORACIC ECHOCARDIOGRAM  05/2018; 12/03/2019   (after MI secondary to arterial spasm): EF 45-50%; severe hypokinesis of apical anterolateral, inferolateral ,  anterior, and inferior LV myocardium. 11/2019 EF 55-60%, valves fine  . ULTRASOUND EXAM,PELVIC COMPLETE ([facility] HX)  06/25/15   NORMAL (done by GYN)     Current Meds  Medication Sig  . Acetaminophen 500 MG coapsule Take 650 mg by mouth every 6 (six) hours as needed for mild pain or headache.   . albuterol (VENTOLIN HFA) 108 (90 Base) MCG/ACT inhaler Inhale 2 puffs into the lungs every 4 (four)  hours as needed for wheezing or shortness of breath.  . ALPRAZolam (XANAX) 0.5 MG tablet 1 tab po tid for anxiety  . ANORO ELLIPTA 62.5-25 MCG/INH AEPB INHALE 1 PUFF BY MOUTH EVERY DAY  . aspirin EC 81 MG EC tablet Take 1 tablet (81 mg total) by mouth daily.  Marland Kitchen atorvastatin (LIPITOR) 80 MG tablet Take 1 tablet (80 mg total) by mouth daily at 6 PM.  . carvedilol (COREG) 25 MG tablet Take 1 tablet (25 mg total) by mouth 2 (two) times daily with a meal.  . citalopram (CELEXA) 20 MG tablet Take 1 tablet (20 mg total) by mouth 2 (two) times daily.  . cyclobenzaprine (FLEXERIL) 10 MG tablet TAKE 1 TABLET BY MOUTH TWICE A DAY  . doxycycline (VIBRAMYCIN) 50 MG capsule Take 50 mg by mouth daily.   . famotidine (PEPCID) 40 MG tablet TAKE 1 TABLET BY MOUTH EVERY DAY  . fluticasone (FLONASE) 50 MCG/ACT nasal spray SPRAY 2 SPRAYS INTO EACH NOSTRIL EVERY DAY  . hydrochlorothiazide (HYDRODIURIL) 12.5 MG tablet Take 1 tablet (12.5 mg total) by mouth daily.  . hydrOXYzine (ATARAX/VISTARIL) 25 MG tablet 1-2 tabs po tid prn anxiety  . ipratropium-albuterol (DUONEB) 0.5-2.5 (3) MG/3ML SOLN Take 3 mLs by nebulization 3 (three) times daily as needed.  . lamoTRIgine (LAMICTAL) 100 MG tablet TAKE 3 TABLETS (300 MG TOTAL) BY MOUTH AT BEDTIME.  Marland Kitchen losartan (COZAAR) 25 MG tablet Take 1 tablet (25 mg total) by mouth daily.  . Magnesium 250 MG TABS Take 1 tablet by mouth daily.  . montelukast (SINGULAIR) 10 MG tablet TAKE 1 TABLET BY MOUTH EVERY DAY IN THE EVENING  . Multiple Vitamin (MULTI-VITAMINS) TABS Take 1 tablet by mouth daily.    . nitroGLYCERIN (NITROSTAT) 0.4 MG SL tablet Place 1 tablet (0.4 mg total) under the tongue every 5 (five) minutes x 3 doses as needed for chest pain.  . polyethylene glycol powder (GLYCOLAX/MIRALAX) powder TAKE 17 G BY MOUTH DAILY.  Marland Kitchen potassium citrate (UROCIT-K) 10 MEQ (1080 MG) SR tablet Take 10 mEq by mouth 2 (two) times a day.   Marland Kitchen Propylene Glycol (SYSTANE COMPLETE) 0.6 % SOLN Apply 1 drop to eye 2 (two) times daily.  . tamsulosin (FLOMAX) 0.4 MG CAPS capsule Take 0.4 mg by mouth daily.  Marland Kitchen tretinoin (RETIN-A) 0.025 % cream APPLY TO FACE AT BEDTIME AS NEEDED  . triamcinolone cream (KENALOG) 0.1 % Apply 1 application topically daily.  . TURMERIC PO Take 1 tablet by mouth 2 (two) times a day.  . zolpidem (AMBIEN) 10 MG tablet TAKE 1 TABLET BY MOUTH EVERYDAY AT BEDTIME     Allergies:   Ceftin [cefuroxime axetil], Ciprofloxacin, Fosamax [alendronate sodium], Morphine and related, Prednisone, and Seroquel [quetiapine]   Social History   Tobacco Use  . Smoking status: Former Smoker    Packs/day: 1.00    Years: 33.00    Pack years: 33.00    Types: Cigarettes    Quit date: 08/22/1993    Years since quitting: 26.3  . Smokeless tobacco: Never Used  Vaping Use  . Vaping Use: Never used  Substance Use Topics  . Alcohol use: No    Alcohol/week: 0.0 standard drinks    Comment: ALCOHOLIC IN REMISSION SINCE  2004  . Drug use: No     Family Hx: The patient's family history includes Alcohol abuse in her mother; Arthritis in her mother; Breast cancer in her mother; Colon polyps in her brother and sister; Diabetes in her father and mother;  Esophageal cancer in her sister; Heart disease in her brother and father; Hyperlipidemia in her father and mother; Hypertension in her father and mother; Irritable bowel syndrome in her sister; Parkinsonism in her mother; Prostate cancer in her father.  ROS:   Please see the history of present illness.     All other systems reviewed and are  negative.   Labs/Other Tests and Data Reviewed:    Recent Labs: 02/19/2019: TSH 1.55 04/19/2019: ALT 18 08/13/2019: Hemoglobin 10.8; Platelets 225.0 10/21/2019: NT-Pro BNP 89 12/03/2019: BUN 8; Creatinine, Ser 0.87; Potassium 3.8; Sodium 135   Recent Lipid Panel Lab Results  Component Value Date/Time   CHOL 168 05/29/2019 01:58 PM   CHOL 152 09/25/2018 10:07 AM   TRIG 111.0 05/29/2019 01:58 PM   HDL 57.50 05/29/2019 01:58 PM   HDL 62 09/25/2018 10:07 AM   CHOLHDL 3 05/29/2019 01:58 PM   LDLCALC 88 05/29/2019 01:58 PM   LDLCALC 69 09/25/2018 10:07 AM   LDLDIRECT 144.1 05/29/2013 11:56 AM    Wt Readings from Last 3 Encounters:  12/11/19 180 lb (81.6 kg)  12/03/19 182 lb (82.6 kg)  10/21/19 182 lb 9.6 oz (82.8 kg)     Objective:    Vital Signs:  Ht 5\' 6"  (1.676 m)   Wt 180 lb (81.6 kg)   BMI 29.05 kg/m     ASSESSMENT & PLAN:    1.  ASCAD  -status post NSTEMI with cath 05/2018 showing coronary artery vasospasm with distal LAD lesion at 60%. Following IC NTG, this reduced to approximately 30% diffuse disease, consistent with vasospasm. There was proximal to mid RCA lesion as well that was 45% stenosed.      -She was intolerant to long-acting nitrates due to severe headaches and was switched to amlodipine but she did not tolerate due to LE edema.   -recently has atypical CP with Lexiscan myoview showing no ischemia and 2D echo with normal LVF -she still has some tightness and SOB when walking that she related to 20lb weight gain and COPD.  Both of these symptoms improve with use of inhalers c/w COPD/emphysema. -continue aspirin 81 mg daily and statin therapy. -given her COPD and DOE, I have recommended stopping Carvedilol and changing to a Beta 1 selective BB -will start Toprol XL 100mg  daily  2.  Hypertension  -BP is controlled -continue Losartan 25mg  daily -change Carvedilol to beta 1 selective BB Toprol XL 100mg  daily due to her COPD -check BP daily for a week and call  with results -SCR was 0.87 after starting ARB  3.  Hyperlipidemia  -her LDL goal is less than 70.   -Her last LDL was 88 on 05/29/2019.  -continue atorvastatin 80mg  daily -start Zetia 10mg  daily  4.  Ischemic dilated cardiomyopathy  -secondary to NSTEMI with distal LAD spasm.   -Her EF was 45 to 50% by echo at time of MI -LVF has normalized on most recent echo this year -She will continue on beta-blocker therapy.   Patient Risk:   After full review of this patient's clinical status, I feel that they are at least moderate risk at this time.  Time:   Today, I have spent 20 min on telemedicine discussing medical problems including coronary vasospasm, lipids, HTN, DCM and reviewing patient's chart including labs, Lexiscan myoview and 2D echo  Medication Adjustments/Labs and Tests Ordered: Current medicines are reviewed at length with the patient today.  Concerns regarding medicines are outlined above.  Tests Ordered: No orders of the  defined types were placed in this encounter.  Medication Changes: No orders of the defined types were placed in this encounter.   Disposition:  Follow up 1 year  Signed, Fransico Him, MD  12/11/2019 10:32 AM    [place]

## 2019-12-11 ENCOUNTER — Encounter: Payer: Self-pay | Admitting: Cardiology

## 2019-12-11 ENCOUNTER — Other Ambulatory Visit: Payer: Self-pay

## 2019-12-11 ENCOUNTER — Telehealth (INDEPENDENT_AMBULATORY_CARE_PROVIDER_SITE_OTHER): Payer: Medicare Other | Admitting: Cardiology

## 2019-12-11 VITALS — BP 125/78 | HR 83 | Ht 66.0 in | Wt 180.0 lb

## 2019-12-11 DIAGNOSIS — I10 Essential (primary) hypertension: Secondary | ICD-10-CM

## 2019-12-11 DIAGNOSIS — I2510 Atherosclerotic heart disease of native coronary artery without angina pectoris: Secondary | ICD-10-CM | POA: Diagnosis not present

## 2019-12-11 DIAGNOSIS — E7800 Pure hypercholesterolemia, unspecified: Secondary | ICD-10-CM | POA: Diagnosis not present

## 2019-12-11 DIAGNOSIS — I201 Angina pectoris with documented spasm: Secondary | ICD-10-CM

## 2019-12-11 DIAGNOSIS — I2583 Coronary atherosclerosis due to lipid rich plaque: Secondary | ICD-10-CM

## 2019-12-11 DIAGNOSIS — I255 Ischemic cardiomyopathy: Secondary | ICD-10-CM | POA: Diagnosis not present

## 2019-12-11 MED ORDER — METOPROLOL SUCCINATE ER 100 MG PO TB24: 100.0000 mg | ORAL_TABLET | Freq: Every day | ORAL | 3 refills | Status: DC

## 2019-12-11 NOTE — Addendum Note (Signed)
Addended by: Antonieta Iba on: 12/11/2019 10:46 AM   Modules accepted: Orders

## 2019-12-11 NOTE — Patient Instructions (Addendum)
Please take your blood pressure and heart rate daily for one week and send [place] a MyChart message with a list of your readings.   Medication Instructions:  Your physician has recommended you make the following change in your medication:  1) STOP taking carvedilol (Coreg) 2) START taking Toprol XL (metoprolol succinate) 100 mg daily   *If you need a refill on your cardiac medications before your next appointment, please call your pharmacy*  Follow-Up: At [facility], you and your health needs are our priority.  As part of our continuing mission to provide you with exceptional heart care, we have created designated Provider Care Teams.  These Care Teams include your primary Cardiologist (physician) and Advanced Practice Providers (APPs -  Physician Assistants and Nurse Practitioners) who all work together to provide you with the care you need, when you need it.  Your next appointment:   1 year(s)  The format for your next appointment:   In Person  Provider:   You may see Fransico Him, MD or one of the following Advanced Practice Providers on your designated Care Team:    Melina Copa, PA-C  Ermalinda Barrios, PA-C

## 2019-12-16 ENCOUNTER — Other Ambulatory Visit: Payer: Self-pay | Admitting: Family Medicine

## 2019-12-17 NOTE — Telephone Encounter (Signed)
RF request for ambien. Last OV 09/19/19 Next OV 02/13/20 Last RX 06/07/19 # 90 x 1 rf.  Please advise.

## 2019-12-24 ENCOUNTER — Other Ambulatory Visit: Payer: Self-pay | Admitting: Family Medicine

## 2019-12-26 ENCOUNTER — Other Ambulatory Visit: Payer: Self-pay

## 2019-12-26 ENCOUNTER — Other Ambulatory Visit: Payer: Self-pay | Admitting: Family Medicine

## 2019-12-26 ENCOUNTER — Encounter: Payer: Self-pay | Admitting: Pulmonary Disease

## 2019-12-26 ENCOUNTER — Ambulatory Visit: Payer: Medicare Other | Admitting: Pulmonary Disease

## 2019-12-26 VITALS — BP 128/76 | HR 100 | Temp 97.3°F | Ht 66.0 in | Wt 180.8 lb

## 2019-12-26 DIAGNOSIS — J449 Chronic obstructive pulmonary disease, unspecified: Secondary | ICD-10-CM

## 2019-12-26 DIAGNOSIS — Z23 Encounter for immunization: Secondary | ICD-10-CM

## 2019-12-26 NOTE — Progress Notes (Signed)
Erin Lopez    [id number]    1950-12-13  Primary Care Physician:McGowen, Adrian Blackwater, MD  Referring Physician: Tammi Sou, MD [address] [place],   [address]  Chief complaint: Follow-up for COPD  HPI: Erin Lopez is a 69 year old with smoking history.  Here for management of COPD.  She has followed intermittently with me since 2017.  Initially on Advair inhaler which is changed to Anoro with good response Evaluated for OSA with negative sleep study a few years ago.  Pets: Dogs.  No cats.  Used to have coronary and parakeet 20 years ago. 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 Exposures: No known exposures Smoking history: 30-pack-year smoking history.  Quit in 1995 Travel History: Not significant.  Interim history: Continues on Anoro inhaler with stable symptoms.  No new complaints today  Outpatient Encounter Medications as of 12/26/2019  Medication Sig  . Acetaminophen 500 MG coapsule Take 650 mg by mouth every 6 (six) hours as needed for mild pain or headache.   . albuterol (VENTOLIN HFA) 108 (90 Base) MCG/ACT inhaler Inhale 2 puffs into the lungs every 4 (four) hours as needed for wheezing or shortness of breath.  . ALPRAZolam (XANAX) 0.5 MG tablet 1 tab po tid for anxiety  . ANORO ELLIPTA 62.5-25 MCG/INH AEPB INHALE 1 PUFF BY MOUTH EVERY DAY  . aspirin EC 81 MG EC tablet Take 1 tablet (81 mg total) by mouth daily.  Marland Kitchen atorvastatin (LIPITOR) 80 MG tablet Take 1 tablet (80 mg total) by mouth daily at 6 PM.  . citalopram (CELEXA) 20 MG tablet Take 1 tablet (20 mg total) by mouth 2 (two) times daily.  . cyclobenzaprine (FLEXERIL) 10 MG tablet TAKE 1 TABLET BY MOUTH TWICE A DAY  . famotidine (PEPCID) 40 MG tablet TAKE 1 TABLET BY MOUTH EVERY DAY  . fluticasone (FLONASE) 50 MCG/ACT nasal spray SPRAY 2 SPRAYS INTO EACH NOSTRIL EVERY DAY  . hydrochlorothiazide (HYDRODIURIL) 12.5 MG tablet Take 1 tablet (12.5 mg total)  by mouth daily.  . hydrOXYzine (ATARAX/VISTARIL) 25 MG tablet 1-2 tabs po tid prn anxiety  . ipratropium-albuterol (DUONEB) 0.5-2.5 (3) MG/3ML SOLN Take 3 mLs by nebulization 3 (three) times daily as needed.  . lamoTRIgine (LAMICTAL) 100 MG tablet TAKE 3 TABLETS (300 MG TOTAL) BY MOUTH AT BEDTIME.  Marland Kitchen losartan (COZAAR) 25 MG tablet Take 1 tablet (25 mg total) by mouth daily.  . Magnesium 250 MG TABS Take 1 tablet by mouth daily.  . metoprolol succinate (TOPROL XL) 100 MG 24 hr tablet Take 1 tablet (100 mg total) by mouth daily. Take with or immediately following a meal.  . montelukast (SINGULAIR) 10 MG tablet TAKE 1 TABLET BY MOUTH EVERY DAY IN THE EVENING  . Multiple Vitamin (MULTI-VITAMINS) TABS Take 1 tablet by mouth daily.   . nitroGLYCERIN (NITROSTAT) 0.4 MG SL tablet Place 1 tablet (0.4 mg total) under the tongue every 5 (five) minutes x 3 doses as needed for chest pain.  . polyethylene glycol powder (GLYCOLAX/MIRALAX) powder TAKE 17 G BY MOUTH DAILY.  Marland Kitchen potassium citrate (UROCIT-K) 10 MEQ (1080 MG) SR tablet Take 10 mEq by mouth 2 (two) times a day.   Marland Kitchen Propylene Glycol (SYSTANE COMPLETE) 0.6 % SOLN Apply 1 drop to eye 2 (two) times daily.  . tamsulosin (FLOMAX) 0.4 MG CAPS capsule Take 0.4 mg by mouth daily.  Marland Kitchen tretinoin (RETIN-A) 0.025 %  cream APPLY TO FACE AT BEDTIME AS NEEDED  . triamcinolone cream (KENALOG) 0.1 % Apply 1 application topically daily.  . TURMERIC PO Take 1 tablet by mouth 2 (two) times a day.  . zolpidem (AMBIEN) 10 MG tablet TAKE 1 TABLET BY MOUTH EVERYDAY AT BEDTIME  . [DISCONTINUED] doxycycline (VIBRAMYCIN) 50 MG capsule Take 50 mg by mouth daily.    No facility-administered encounter medications on file as of 12/26/2019.   Physical Exam: Blood pressure 128/76, pulse 100, temperature (!) 97.3 F (36.3 C), temperature source Oral, height 5\' 6"  (1.676 m), weight 180 lb 12.8 oz (82 kg), SpO2 96 %. Gen:      No acute distress HEENT:  EOMI, sclera anicteric Neck:      No masses; no thyromegaly Lungs:    Clear to auscultation bilaterally; normal respiratory effort CV:         Regular rate and rhythm; no murmurs Abd:      + bowel sounds; soft, non-tender; no palpable masses, no distension Ext:    No edema; adequate peripheral perfusion Skin:      Warm and dry; no rash Neuro: alert and oriented x 3 Psych: normal mood and affect  Data Reviewed: Imaging: CT abd 08/06/13.- Linear bilateral lower lobe atelectasis or scarring noted. Areas of mild bronchiectasis are noted.  Chest x-ray 04/25/17- mild basilar atelectasis, minimal blunting of the costophrenic angle. Chest x-ray 06/05/2019-no acute cardiopulmonary abnormality. I have reviewed the images personally.  Barium swallow 04/18/08 Minimal hiatal hernia. GE reflux.  PFTs  06/16/15 FVC 3.01 (85%), FEV1 1.65 (60%), F/F 55 Moderate obstructive defect.  06/12/17 FVC 2.83 [79%], FEV1 1.69 [60%], F/F 60, TLC 122%, RV/TLC 140%, DLCO 43% Moderate obstructive defect, severe diffusion impairment.  Assessment:  Moderate COPD Continue Anoro inhaler She is requesting referral to pulmonary rehab at [facility] which is close to her home  Health maintenance Up-to-date with Covid vaccine.  She is planning on getting a booster shot Flu vaccine today  Plan/Recommendations: - Continue anoro - Pulmonary rehab - Flu vaccine  Marshell Garfinkel MD [facility] Pager [phone] 12/26/2019, 2:57 PM  CC: McGowen, Adrian Blackwater, MD

## 2019-12-26 NOTE — Patient Instructions (Signed)
We will make a referral for pulmonary rehab to be done at [place] Anoro inhaler Flu vaccine today  Follow-up in 1 year

## 2019-12-27 ENCOUNTER — Telehealth: Payer: Self-pay | Admitting: Pulmonary Disease

## 2019-12-27 ENCOUNTER — Telehealth (HOSPITAL_COMMUNITY): Payer: Self-pay | Admitting: *Deleted

## 2019-12-27 ENCOUNTER — Encounter (HOSPITAL_COMMUNITY): Payer: Self-pay | Admitting: *Deleted

## 2019-12-27 NOTE — Telephone Encounter (Signed)
Spoke with the Carlette at pulmonary rehab  She received a referral for pulmonary rehab at [facility] She wanted to let Dr Vaughan Browner know that [facility] does not manage this and it is not actually a pulmonary rehab facility  It is a sports medicine based facility that does involve monitored exercise  Pt not interested in pulmonary rehab at [facility]  Do you want to refer to Sports Med? Please advise thanks

## 2019-12-27 NOTE — Telephone Encounter (Signed)
Called and spoke with pt regarding referral we received for her to participate in pulmonary rehab at [facility].  Tye Maryland was under the impression she could receive pulmonary rehab at the fitness center. It was closer to her home and she did not want to drive to [place] called and verified that [facility] does not have a "pulmonary program" it is a sports medicine clinic where they have physical therapy.  Affiliated with [place] pt of my findings and she talked in circles about what she was wanting.  Based upon what I was able to gather from the extended rambling conversation, pt needs would be better suited here at [facility].  Pt agreed and said she would "try" it for a couple of weeks. Will have support staff verify insurance and contact her for scheduling.  Pt aware it will be late October due to the extensive wait list already in place. Cherre Huger, BSN Cardiac and Training and development officer

## 2019-12-27 NOTE — Progress Notes (Signed)
Received referral from Dr.Mannam for this pt to participate in pulmonary rehab with the the diagnosis of COPD. Pt with PFT in 2019 which showed FEV1 post pred 62. Clinical review of pt follow up appt on 9/24. Pulmonary office note.  Pt with Covid Risk Score - 4. Pt appropriate for scheduling for Pulmonary rehab.  Will forward to support staff for scheduling and verification of insurance eligibility/benefits with pt consent. Cherre Huger, BSN Cardiac and Training and development officer

## 2019-12-27 NOTE — Telephone Encounter (Signed)
Yes. Please refer to sports medicine at [place]

## 2019-12-27 NOTE — Telephone Encounter (Signed)
Just and FYI- Carlette from pulmonary rehab at [facility] sent me a secured that stating that she spoke with the pt again and she has changed her mind about pulmonary rehab at [facility] and she now wants to attend their program. Will hold off on sports med ref.

## 2020-01-06 ENCOUNTER — Other Ambulatory Visit: Payer: Self-pay | Admitting: Family Medicine

## 2020-01-07 ENCOUNTER — Encounter (HOSPITAL_COMMUNITY): Payer: Self-pay

## 2020-01-07 NOTE — Telephone Encounter (Signed)
Requesting: alprazolam Contract:02/19/19 UDS:09/19/19 Last Visit:09/19/19 Next Visit:02/13/20 Last Refill:10/22/19(90,1)  Please Advise. Medication pending

## 2020-01-07 NOTE — Telephone Encounter (Signed)
Patient made aware refill sent.  

## 2020-01-20 ENCOUNTER — Other Ambulatory Visit: Payer: Self-pay | Admitting: Family Medicine

## 2020-01-20 ENCOUNTER — Telehealth: Payer: Self-pay

## 2020-01-20 NOTE — Telephone Encounter (Signed)
Pt sched for abd pain 10/19 with PCP

## 2020-01-20 NOTE — Telephone Encounter (Signed)
Stomach pain again --- Gertie Fey referral not until December. Do we send to urgent care or med center so she can get ultrasound or will [name] work her in tomorrow morning so we can get her some help/relief Before he goes out of office?  Please call Tye Maryland [phone]

## 2020-01-21 ENCOUNTER — Other Ambulatory Visit: Payer: Self-pay

## 2020-01-21 ENCOUNTER — Encounter: Payer: Self-pay | Admitting: Family Medicine

## 2020-01-21 ENCOUNTER — Ambulatory Visit (INDEPENDENT_AMBULATORY_CARE_PROVIDER_SITE_OTHER): Payer: Medicare Other | Admitting: Family Medicine

## 2020-01-21 VITALS — BP 106/70 | HR 70 | Temp 97.7°F | Resp 16 | Ht 66.0 in | Wt 181.0 lb

## 2020-01-21 DIAGNOSIS — R110 Nausea: Secondary | ICD-10-CM

## 2020-01-21 DIAGNOSIS — K8020 Calculus of gallbladder without cholecystitis without obstruction: Secondary | ICD-10-CM

## 2020-01-21 DIAGNOSIS — R1013 Epigastric pain: Secondary | ICD-10-CM

## 2020-01-21 DIAGNOSIS — K2900 Acute gastritis without bleeding: Secondary | ICD-10-CM

## 2020-01-21 LAB — CBC WITH DIFFERENTIAL/PLATELET
Basophils Absolute: 0 10*3/uL (ref 0.0–0.1)
Basophils Relative: 0.4 % (ref 0.0–3.0)
Eosinophils Absolute: 0.2 10*3/uL (ref 0.0–0.7)
Eosinophils Relative: 3.3 % (ref 0.0–5.0)
HCT: 34.4 % — ABNORMAL LOW (ref 36.0–46.0)
Hemoglobin: 11.9 g/dL — ABNORMAL LOW (ref 12.0–15.0)
Lymphocytes Relative: 30.1 % (ref 12.0–46.0)
Lymphs Abs: 1.7 10*3/uL (ref 0.7–4.0)
MCHC: 34.5 g/dL (ref 30.0–36.0)
MCV: 90.4 fl (ref 78.0–100.0)
Monocytes Absolute: 0.6 10*3/uL (ref 0.1–1.0)
Monocytes Relative: 10.3 % (ref 3.0–12.0)
Neutro Abs: 3.1 10*3/uL (ref 1.4–7.7)
Neutrophils Relative %: 55.9 % (ref 43.0–77.0)
Platelets: 250 10*3/uL (ref 150.0–400.0)
RBC: 3.81 Mil/uL — ABNORMAL LOW (ref 3.87–5.11)
RDW: 13.1 % (ref 11.5–15.5)
WBC: 5.6 10*3/uL (ref 4.0–10.5)

## 2020-01-21 LAB — COMPREHENSIVE METABOLIC PANEL
ALT: 23 U/L (ref 0–35)
AST: 23 U/L (ref 0–37)
Albumin: 4.5 g/dL (ref 3.5–5.2)
Alkaline Phosphatase: 63 U/L (ref 39–117)
BUN: 10 mg/dL (ref 6–23)
CO2: 33 mEq/L — ABNORMAL HIGH (ref 19–32)
Calcium: 9.7 mg/dL (ref 8.4–10.5)
Chloride: 99 mEq/L (ref 96–112)
Creatinine, Ser: 0.94 mg/dL (ref 0.40–1.20)
GFR: 62.75 mL/min (ref >60.00)
Glucose, Bld: 92 mg/dL (ref 70–99)
Potassium: 3.5 mEq/L (ref 3.5–5.1)
Sodium: 138 mEq/L (ref 135–145)
Total Bilirubin: 0.7 mg/dL (ref 0.2–1.2)
Total Protein: 6.4 g/dL (ref 6.0–8.3)

## 2020-01-21 LAB — LIPASE: Lipase: 50 U/L (ref 11.0–59.0)

## 2020-01-21 MED ORDER — PANTOPRAZOLE SODIUM 40 MG PO TBEC: 40.0000 mg | DELAYED_RELEASE_TABLET | Freq: Every day | ORAL | 0 refills | Status: DC

## 2020-01-21 MED ORDER — PROMETHAZINE HCL 12.5 MG PO TABS: ORAL_TABLET | ORAL | 0 refills | Status: DC

## 2020-01-21 MED ORDER — SUCRALFATE 1 G PO TABS: 1.0000 g | ORAL_TABLET | Freq: Three times a day (TID) | ORAL | 0 refills | Status: DC

## 2020-01-21 NOTE — Patient Instructions (Signed)
Stop aspirin and famotidine for now.  Bland Diet A bland diet consists of foods that are often soft and do not have a lot of fat, fiber, or extra seasonings. Foods without fat, fiber, or seasoning are easier for the body to digest. They are also less likely to irritate your mouth, throat, stomach, and other parts of your digestive system. A bland diet is sometimes called a BRAT diet. What is my plan? Your health care provider or food and nutrition specialist (dietitian) may recommend specific changes to your diet to prevent symptoms or to treat your symptoms. These changes may include:  Eating small meals often.  Cooking food until it is soft enough to chew easily.  Chewing your food well.  Drinking fluids slowly.  Not eating foods that are very spicy, sour, or fatty.  Not eating citrus fruits, such as oranges and grapefruit. What do I need to know about this diet?  Eat a variety of foods from the bland diet food list.  Do not follow a bland diet longer than needed.  Ask your health care provider whether you should take vitamins or supplements. What foods can I eat? Grains  Hot cereals, such as cream of wheat. Rice. Bread, crackers, or tortillas made from refined white flour. Vegetables Canned or cooked vegetables. Mashed or boiled potatoes. Fruits  Bananas. Applesauce. Other types of cooked or canned fruit with the skin and seeds removed, such as canned peaches or pears. Meats and other proteins  Scrambled eggs. Creamy peanut butter or other nut butters. Lean, well-cooked meats, such as chicken or fish. Tofu. Soups or broths. Dairy Low-fat dairy products, such as milk, cottage cheese, or yogurt. Beverages  Water. Herbal tea. Apple juice. Fats and oils Mild salad dressings. Canola or olive oil. Sweets and desserts Pudding. Custard. Fruit gelatin. Ice cream. The items listed above may not be a complete list of recommended foods and beverages. Contact a dietitian for more  options. What foods are not recommended? Grains Whole grain breads and cereals. Vegetables Raw vegetables. Fruits Raw fruits, especially citrus, berries, or dried fruits. Dairy Whole fat dairy foods. Beverages Caffeinated drinks. Alcohol. Seasonings and condiments Strongly flavored seasonings or condiments. Hot sauce. Salsa. Other foods Spicy foods. Fried foods. Sour foods, such as pickled or fermented foods. Foods with high sugar content. Foods high in fiber. The items listed above may not be a complete list of foods and beverages to avoid. Contact a dietitian for more information. Summary  A bland diet consists of foods that are often soft and do not have a lot of fat, fiber, or extra seasonings.  Foods without fat, fiber, or seasoning are easier for the body to digest.  Check with your health care provider to see how long you should follow this diet plan. It is not meant to be followed for long periods. This information is not intended to replace advice given to you by your health care provider. Make sure you discuss any questions you have with your health care provider. Document Revised: 04/19/2017 Document Reviewed: 04/19/2017 Elsevier Patient Education  2020 [employer].

## 2020-01-21 NOTE — Progress Notes (Signed)
OFFICE VISIT  01/21/2020  CC:  Chief Complaint  Patient presents with  . Abdominal Pain    x6 days, has worsened over the last 2-3 days   HPI:    Patient is a 69 y.o. Caucasian female who presents for abdominal pain. Onset about 4-5 d/a, pain in epigastric area, midline, nausea but no vomiting.  Onset pretty much after eating, worsens since that time every time she eats now.  Drinking some water but mostly 7 up.  No constipation or diarrhea.  No fever.  She takes NO NSAIDs.  She does take 81mg  ASA qd.  Her pain is same sitting as it is moving around. Trying mylanta for current pain and no help. She is taking all meds as rx'd. Only occ feeling of reflux up into chest/throat--takes famotidine 40mg  qd. She has no nausea med.  Has a known hx of gallstones. Has hx of recurrent ureterolithiasis. No hx of abdominal surgery.   ROS: no fevers, no CP, no SOB, no wheezing, no cough, no dizziness, no HAs, no rashes, no melena/hematochezia.  No polyuria or polydipsia.  No myalgias or arthralgias.  No focal weakness, paresthesias, or tremors.  No acute vision or hearing abnormalities. No palpitations.     PMP AWARE reviewed today: most recent rx for alprazolam 0.5mg  was filled 01/07/20, # 34, rx by me. Most recent rx for ambien 10mg  was filled 12/17/19, #90, rx by me. No red flags.  Past Medical History:  Diagnosis Date  . Alcoholism in remission [facility])    states last alcohol 11 yrs ago  . Anxiety   . Asymptomatic gallstones    u/s 03/2019 (noted on imaging prior to that though)  . Bipolar 1 disorder ([place])    Admission for mania x 2 (most recent 11/2017)  . Cervical spondylosis without myelopathy 12/19/2013   MRI 02/2014: multilevel DDD/spondylosis, not much change compared to prior MRI.  Pt not in favor of invasive therapy for her neck as of 04/2014.  Marland Kitchen COPD (chronic obstructive pulmonary disease) (HCC)    Moderate: anoro started 04/2017 by pulm, pt symptomatically improved and PFTs stable at  f/u 06/2017.  Marland Kitchen Coronary artery spasm ([place]) 07/11/2018   nitrates=HA. Amlodipine=intol swelling.  Changed to coreg 09/03/18 by cardiologist.  . Dilutional hyponatremia   . Endometritis 05/2017   possible; empiric tx with Flagyl by GYN.  . Environmental allergies   . Fibromyalgia   . GERD (gastroesophageal reflux disease)   . Hepatic steatosis 03/2019   u/s abd  . Herpes zoster 08/07/2017   L scapular region  . Hiatal hernia   . History of adenomatous polyp of colon 04/2008; 08/2014   No high grade dysplasia: recall 5 yrs ([name], [name])  . History of basal cell carcinoma excision    NOSE  . History of Helicobacter pylori infection 04/2008   +gastric biopsy (gastritis but no metaplasia, dysplasia, or malignancy identified)  . History of TIA (transient ischemic attack)    secondary to HELLP syndrome 1994 post c/s--  residual memory loss  . Hyperlipidemia    statin started after her NSTEMI: goal  LDL <70.  Marland Kitchen Hypertension   . Internal hemorrhoids   . Ischemic cardiomyopathy 05/2018   Echo EF 45-50% in context of NSTEMI from CA spasm. // Echo 8/21: 55-60, mild LVH, normal RVSF, RVSP 40, trivial MR  . 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 04/2017.  . Leg pain    ABIs 11/2019: normal   . Memory loss    Abnl MRI brain and CT brain c/w chronic microvascular ischemia.  Repeat MRI brain 02/2014 stable (Dr. Mayra Reel with no plan to f/u with neuro as of 04/2014)  . NSTEMI (non-ST elevated myocardial infarction) ([place]) 05/2018   Echo EF 45-50% in context of NSTEMI from CA spasm.// Myoview 8/21: EF 66, no ischemia, low risk  . Osteoporosis 05/2010   2012 'penia; 06/2015 'porosis:  prolia.  DEXA 09/2017 T-score -2.2 femur neck.  As of 11/2018 we're restarting prolia soon and will check next DEXA 1 yr from her  next prolia injection.  . Pelvic floor dysfunction    [facility] ([name])  . Postmenopausal vaginal bleeding 05/2017   x 1 small episode; GYN attempted endo bx but unable to penetrate cervix due to severe cerv stenosis (due to postmenopausal state + hx of LEEP).  Endo u/s showed thin endo lining.  Per GYN---no evidence of endomet pathology on w/u---obs as of 07/2017.  Marland Kitchen Prediabetes 06/2016   Fasting gluc 105; HbA1c at that time was 6.0%.  A1c 6.2% 12/2017.  Marland Kitchen Recurrent kidney stones 08/2013   Left ureteral calculus: Perc nephr--cystoscopy w/ureteroscopy + laser for stone removal.  Residual asymptomatic left renal nephrolithiasis <71mm present post-procedure.  Right sided hydronephrosis-persistent (on u/s)--urol ordered CT to further eval 08/2016: 1.6 cm nonobstructing stone lower pole left kidney, no hydro, plan for PCN extraction ([facility])  . Shortness of breath   . Sprain of neck 12/19/2013  . TMJ (temporomandibular joint disorder)    USES  MOUTH GUARD  . Toe fracture, left 12/2017   2nd toe prox phalanx (immobilization, [name])  . Vitamin D deficiency 05/2011  . Weak urinary stream 07/2016   with elevated PVR and mild left hydronephrosis secondary to this ([facility] started flomax 0.4mg  qd for this).    Past Surgical History:  Procedure Laterality Date  . ABIs  11/29/2019   NORMAL  . ANTERIOR CERVICAL DECOMP/DISCECTOMY FUSION  06-30-2009   C3 -- C5  AND EXPLORATION OF FUSION C5-7 W/  PLATE REMOVAL  . BUNIONECTOMY Left   . CARDIOVASCULAR STRESS TEST  12/03/2019   myoc perf im: NORMAL  . CERVICAL FUSION  2002   anterior C5 -- C7  . CERVICAL SPINE SURGERY  08-27-1999     C6 -- T1  LAMINECTOMY/  DISKECTOMY  . CESAREAN SECTION  1994  . COLONOSCOPY W/ POLYPECTOMY  04/2008;08/2014   2016 tubular adenoma x 1; +diverticulosis and int/ext hemorrhoids.  Recall 5 yrs  . CYSTOSCOPY WITH URETEROSCOPY AND STENT PLACEMENT Left 08/28/2013   Procedure: CYSTOSCOPY, RGP,  WITH URETEROSCOPY  AND STENT REMOVAL;  Surgeon: [name], MD;  [facility];  Service: Urology;  Laterality: Left;  . DEXA  09/2017   T-score -2.2 femur neck: improved compared to 06/2015.  Marland Kitchen ESOPHAGOGASTRODUODENOSCOPY  2010; 08/2014   +Candidal esophagitis; Mild chronic gastritis w/intestinal metaplasia--NEG H pylori, neg for eosinophilic esoph  . HOLMIUM LASER APPLICATION Left 07/01/5186   Procedure: HOLMIUM LASER APPLICATION;  Surgeon: [name], MD;  Location[facility];  Service: Urology;  Laterality: Left;  . LEFT HEART CATH AND CORONARY ANGIOGRAPHY N/A 05/15/2018   Evidence for spasm in distal LAD.  Mod RCA atherosclerosis, o/w no CAD.  Procedure: LEFT HEART CATH AND CORONARY ANGIOGRAPHY;  Surgeon: Leonie Man, MD;  Location: [facility]  INVASIVE CV LAB;  Service: Cardiovascular;  Laterality: N/A;  . NEGATIVE SLEEP STUDY  04-01-2007  . NEPHROLITHOTOMY Left 08/05/2013   Procedure: NEPHROLITHOTOMY PERCUTANEOUS;  Surgeon: [name], MD;  Location: [facility];  Service: Urology;  Laterality: Left;  . POLYSOMNOGRAM  01/2019   NORMAL  . POLYSOMNOGRAPHY  10/25/2018   Dr. Dion Saucier due to pt inadequat sleep time (83 min).  . TONSILLECTOMY AND ADENOIDECTOMY  1975  . TOTAL KNEE ARTHROPLASTY Right 2003  . TRANSTHORACIC ECHOCARDIOGRAM  05/2018; 12/03/2019   (after MI secondary to arterial spasm): EF 45-50%; severe hypokinesis of apical anterolateral, inferolateral , anterior, and inferior LV myocardium. 11/2019 EF 55-60%, valves fine  . ULTRASOUND EXAM,PELVIC COMPLETE ([facility] HX)  06/25/15   NORMAL (done by GYN)    Outpatient Medications Prior to Visit  Medication Sig Dispense Refill  . Acetaminophen 500 MG coapsule Take 650 mg by mouth every 6 (six) hours as needed for mild pain or headache.     . albuterol (VENTOLIN HFA) 108 (90 Base) MCG/ACT inhaler Inhale 2 puffs into the lungs every 4 (four) hours as needed for wheezing or shortness of breath. 18 g 1   . ALPRAZolam (XANAX) 0.5 MG tablet TAKE 1 TABLET BY MOUTH 3 TIMES A DAY AS NEEDED FOR ANXIETY 90 tablet 1  . ANORO ELLIPTA 62.5-25 MCG/INH AEPB INHALE 1 PUFF BY MOUTH EVERY DAY 60 each 11  . aspirin EC 81 MG EC tablet Take 1 tablet (81 mg total) by mouth daily. 90 tablet 3  . atorvastatin (LIPITOR) 80 MG tablet Take 1 tablet (80 mg total) by mouth daily at 6 PM. 90 tablet 3  . citalopram (CELEXA) 20 MG tablet Take 1 tablet (20 mg total) by mouth 2 (two) times daily. 180 tablet 0  . cyclobenzaprine (FLEXERIL) 10 MG tablet TAKE 1 TABLET BY MOUTH TWICE A DAY 60 tablet 6  . doxycycline (VIBRAMYCIN) 50 MG capsule Take 50 mg by mouth 2 (two) times daily.    . famotidine (PEPCID) 40 MG tablet TAKE 1 TABLET BY MOUTH EVERY DAY 90 tablet 3  . fluticasone (FLONASE) 50 MCG/ACT nasal spray SPRAY 2 SPRAYS INTO EACH NOSTRIL EVERY DAY 48 mL 2  . hydrochlorothiazide (HYDRODIURIL) 12.5 MG tablet Take 1 tablet (12.5 mg total) by mouth daily. 90 tablet 1  . ipratropium-albuterol (DUONEB) 0.5-2.5 (3) MG/3ML SOLN Take 3 mLs by nebulization 3 (three) times daily as needed. 360 mL 3  . lamoTRIgine (LAMICTAL) 100 MG tablet TAKE 3 TABLETS (300 MG TOTAL) BY MOUTH AT BEDTIME. 270 tablet 0  . losartan (COZAAR) 25 MG tablet Take 1 tablet (25 mg total) by mouth daily. 90 tablet 1  . Magnesium 250 MG TABS Take 1 tablet by mouth daily.    . metoprolol succinate (TOPROL XL) 100 MG 24 hr tablet Take 1 tablet (100 mg total) by mouth daily. Take with or immediately following a meal. 90 tablet 3  . montelukast (SINGULAIR) 10 MG tablet TAKE 1 TABLET BY MOUTH EVERY DAY IN THE EVENING 90 tablet 3  . Multiple Vitamin (MULTI-VITAMINS) TABS Take 1 tablet by mouth daily.     . polyethylene glycol powder (GLYCOLAX/MIRALAX) powder TAKE 17 G BY MOUTH DAILY. 3162 g 0  . potassium citrate (UROCIT-K) 10 MEQ (1080 MG) SR tablet Take 10 mEq by mouth 2 (two) times a day.     Marland Kitchen Propylene Glycol (SYSTANE COMPLETE) 0.6 % SOLN Apply 1 drop to eye 2  (two) times daily.    . tamsulosin (  FLOMAX) 0.4 MG CAPS capsule Take 0.4 mg by mouth daily.    Marland Kitchen tretinoin (RETIN-A) 0.025 % cream APPLY TO FACE AT BEDTIME AS NEEDED    . triamcinolone cream (KENALOG) 0.1 % Apply 1 application topically daily.    . TURMERIC PO Take 1 tablet by mouth 2 (two) times a day.    . zolpidem (AMBIEN) 10 MG tablet TAKE 1 TABLET BY MOUTH EVERYDAY AT BEDTIME 90 tablet 1  . hydrOXYzine (ATARAX/VISTARIL) 25 MG tablet 1-2 tabs po tid prn anxiety (Patient not taking: Reported on 01/21/2020) 60 tablet 1  . nitroGLYCERIN (NITROSTAT) 0.4 MG SL tablet Place 1 tablet (0.4 mg total) under the tongue every 5 (five) minutes x 3 doses as needed for chest pain. (Patient not taking: Reported on 01/21/2020) 25 tablet 3   No facility-administered medications prior to visit.    Allergies  Allergen Reactions  . Ceftin [Cefuroxime Axetil] Anaphylaxis    Swelling of eyes and tongue  . Ciprofloxacin Anaphylaxis    Difficulty breathing and generalized swelling  . Fosamax [Alendronate Sodium] Diarrhea  . Morphine And Related Other (See Comments)    Hallucinations/ disoriented - pt stated, "Only Morphine" - 03/23/17  . Prednisone Other (See Comments)    Hyperactivity  . Seroquel [Quetiapine] Other (See Comments)    oversedation    ROS As per HPI  PE: Vitals with BMI 01/21/2020 12/26/2019 12/11/2019  Height 5\' 6"  5\' 6"  5\' 6"   Weight 181 lbs 180 lbs 13 oz 180 lbs  BMI 29.23 05.3 97.67  Systolic 341 937 902  Diastolic 70 76 78  Pulse 70 100 83     Gen: Alert, well appearing.  Patient is oriented to person, place, time, and situation. AFFECT: pleasant, lucid thought and speech. CV: RRR, no m/r/g.   LUNGS: CTA bilat, nonlabored resps, good aeration in all lung fields. ABD: soft, mod TTP in mid epigastric region. ND, BS normal.  No hepatospenomegaly or mass.  No bruits. EXT: no clubbing or cyanosis.  no edema.    LABS:  Lab Results  Component Value Date   TSH 1.55 02/19/2019    Lab Results  Component Value Date   WBC 7.7 08/13/2019   HGB 10.8 (L) 08/13/2019   HCT 31.1 (L) 08/13/2019   MCV 91.3 08/13/2019   PLT 225.0 08/13/2019   Lab Results  Component Value Date   IRON 84 04/29/2014   FERRITIN 27.6 04/29/2014   Lab Results  Component Value Date   VITAMINB12 688 12/11/2017    Lab Results  Component Value Date   CREATININE 0.87 12/03/2019   BUN 8 12/03/2019   NA 135 12/03/2019   K 3.8 12/03/2019   CL 96 12/03/2019   CO2 28 12/03/2019   Lab Results  Component Value Date   ALT 18 04/19/2019   AST 19 04/19/2019   ALKPHOS 53 04/19/2019   BILITOT 0.7 04/19/2019   Lab Results  Component Value Date   CHOL 168 05/29/2019   Lab Results  Component Value Date   HDL 57.50 05/29/2019   Lab Results  Component Value Date   LDLCALC 88 05/29/2019   Lab Results  Component Value Date   TRIG 111.0 05/29/2019   Lab Results  Component Value Date   CHOLHDL 3 05/29/2019   Lab Results  Component Value Date   HGBA1C 5.6 08/13/2019    IMPRESSION AND PLAN:  Acute mid-epigastric pain with nausea. Acute gastritis vs symptomatic cholelithiasis/acute cholecystitis. Check Cbc,cmet, lipase, u/s limited RUQ. Start pantoprazole  40mg  bid, carafate qid, promethazine--eRx'd these today. Stop ASA and famotidine for now.  An After Visit Summary was printed and given to the patient.  FOLLOW UP: Return in about 1 week (around 01/28/2020) for f/u epigastric pain and nausea.  Signed:  Crissie Sickles, MD           01/21/2020

## 2020-01-22 ENCOUNTER — Ambulatory Visit (HOSPITAL_BASED_OUTPATIENT_CLINIC_OR_DEPARTMENT_OTHER)
Admission: RE | Admit: 2020-01-22 | Discharge: 2020-01-22 | Disposition: A | Discharge status: Home / Self Care | Payer: Medicare Other | Source: Ambulatory Visit | Attending: Family Medicine | Admitting: Family Medicine

## 2020-01-22 ENCOUNTER — Telehealth (HOSPITAL_COMMUNITY): Payer: Self-pay

## 2020-01-22 DIAGNOSIS — R1013 Epigastric pain: Secondary | ICD-10-CM

## 2020-01-22 DIAGNOSIS — K8020 Calculus of gallbladder without cholecystitis without obstruction: Secondary | ICD-10-CM | POA: Insufficient documentation

## 2020-01-22 DIAGNOSIS — R110 Nausea: Secondary | ICD-10-CM | POA: Diagnosis not present

## 2020-01-22 NOTE — Telephone Encounter (Signed)
Unable to reach pt in regards to Pulmonary Rehab Closed referral 

## 2020-01-31 ENCOUNTER — Other Ambulatory Visit: Payer: Self-pay

## 2020-01-31 ENCOUNTER — Encounter: Payer: Self-pay | Admitting: Family Medicine

## 2020-01-31 ENCOUNTER — Ambulatory Visit (INDEPENDENT_AMBULATORY_CARE_PROVIDER_SITE_OTHER): Payer: Medicare Other | Admitting: Family Medicine

## 2020-01-31 VITALS — BP 104/67 | HR 60 | Temp 97.7°F | Resp 16 | Ht 66.0 in | Wt 181.2 lb

## 2020-01-31 DIAGNOSIS — K8020 Calculus of gallbladder without cholecystitis without obstruction: Secondary | ICD-10-CM | POA: Diagnosis not present

## 2020-01-31 DIAGNOSIS — K8010 Calculus of gallbladder with chronic cholecystitis without obstruction: Secondary | ICD-10-CM | POA: Diagnosis not present

## 2020-01-31 DIAGNOSIS — R1013 Epigastric pain: Secondary | ICD-10-CM | POA: Diagnosis not present

## 2020-01-31 MED ORDER — ONDANSETRON HCL 8 MG PO TABS: 8.0000 mg | ORAL_TABLET | Freq: Three times a day (TID) | ORAL | 1 refills | Status: DC | PRN

## 2020-01-31 NOTE — Progress Notes (Signed)
OFFICE VISIT  01/31/2020  CC:  Chief Complaint  Patient presents with  . Follow-up    epigastic pain, nausea. Still feeling nauseous today    HPI:    Patient is a 69 y.o. Caucasian female who presents for 10d f/u epigastric pain and nausea. A/P as of last visit: "Acute mid-epigastric pain with nausea. Acute gastritis vs symptomatic cholelithiasis/acute cholecystitis. Check Cbc,cmet, lipase, u/s limited RUQ. Start pantoprazole 40mg  bid, carafate qid, promethazine--eRx'd these today. Stop ASA and famotidine for now."  INTERIM HX: Blood tests normal last visit. RUQ u/s showed: 1. Cholelithiasis without sonographic evidence of acute cholecystitis. 2. Mildly coarsened appearance of liver echotexture.  Since last visit she has not improved. Still mid epigastric discomfort and nausea with regurg sometimes.  Not much burping.  Her sx's seem to be worse after eating. No lower abd pain, diarrhea, or excessive gas. Meds last visit--taken approp but seemingly no help.  ROS: no hematochezia, melena, fever, CP, or SOB.   Past Medical History:  Diagnosis Date  . Alcoholism in remission [facility])    states last alcohol 11 yrs ago  . Anxiety   . Asymptomatic gallstones    u/s 03/2019 (noted on imaging prior to that though)  . Bipolar 1 disorder ([place])    Admission for mania x 2 (most recent 11/2017)  . Cervical spondylosis without myelopathy 12/19/2013   MRI 02/2014: multilevel DDD/spondylosis, not much change compared to prior MRI.  Pt not in favor of invasive therapy for her neck as of 04/2014.  Marland Kitchen COPD (chronic obstructive pulmonary disease) (HCC)    Moderate: anoro started 04/2017 by pulm, pt symptomatically improved and PFTs stable at f/u 06/2017.  Marland Kitchen Coronary artery spasm ([place]) 07/11/2018   nitrates=HA. Amlodipine=intol swelling.  Changed to coreg 09/03/18 by cardiologist.  . Dilutional hyponatremia   . Endometritis 05/2017   possible; empiric tx with Flagyl by GYN.  . Environmental  allergies   . Fibromyalgia   . GERD (gastroesophageal reflux disease)   . Hepatic steatosis 03/2019   u/s abd  . Herpes zoster 08/07/2017   L scapular region  . Hiatal hernia   . History of adenomatous polyp of colon 04/2008; 08/2014   No high grade dysplasia: recall 5 yrs ([name], [name])  . History of basal cell carcinoma excision    NOSE  . History of Helicobacter pylori infection 04/2008   +gastric biopsy (gastritis but no metaplasia, dysplasia, or malignancy identified)  . History of TIA (transient ischemic attack)    secondary to HELLP syndrome 1994 post c/s--  residual memory loss  . Hyperlipidemia    statin started after her NSTEMI: goal  LDL <70.  Marland Kitchen Hypertension   . Internal hemorrhoids   . Ischemic cardiomyopathy 05/2018   Echo EF 45-50% in context of NSTEMI from CA spasm. // Echo 8/21: 55-60, mild LVH, normal RVSF, RVSP 40, trivial MR  . 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 04/2017.  . Leg pain    ABIs 11/2019: normal   . Memory loss    Abnl MRI brain and CT brain c/w chronic microvascular ischemia.  Repeat MRI brain 02/2014 stable (Dr. Mayra Reel with no plan to f/u with neuro as of 04/2014)  . NSTEMI (non-ST elevated myocardial infarction) ([place]) 05/2018   Echo EF 45-50% in context of NSTEMI from CA spasm.// Myoview 8/21: EF 66,  no ischemia, low risk  . Osteoporosis 05/2010   2012 'penia; 06/2015 'porosis:  prolia.  DEXA 09/2017 T-score -2.2 femur neck.  As of 11/2018 we're restarting prolia soon and will check next DEXA 1 yr from her next prolia injection.  . Pelvic floor dysfunction    [facility] ([name])  . Postmenopausal vaginal bleeding 05/2017   x 1 small episode; GYN attempted endo bx but unable to penetrate cervix due to severe cerv stenosis (due to postmenopausal  state + hx of LEEP).  Endo u/s showed thin endo lining.  Per GYN---no evidence of endomet pathology on w/u---obs as of 07/2017.  Marland Kitchen Prediabetes 06/2016   Fasting gluc 105; HbA1c at that time was 6.0%.  A1c 6.2% 12/2017.  Marland Kitchen Recurrent kidney stones 08/2013   Left ureteral calculus: Perc nephr--cystoscopy w/ureteroscopy + laser for stone removal.  Residual asymptomatic left renal nephrolithiasis <33mm present post-procedure.  Right sided hydronephrosis-persistent (on u/s)--urol ordered CT to further eval 08/2016: 1.6 cm nonobstructing stone lower pole left kidney, no hydro, plan for PCN extraction ([facility])  . Shortness of breath   . Sprain of neck 12/19/2013  . TMJ (temporomandibular joint disorder)    USES  MOUTH GUARD  . Toe fracture, left 12/2017   2nd toe prox phalanx (immobilization, [name])  . Vitamin D deficiency 05/2011  . Weak urinary stream 07/2016   with elevated PVR and mild left hydronephrosis secondary to this ([facility] started flomax 0.4mg  qd for this).    Past Surgical History:  Procedure Laterality Date  . ABIs  11/29/2019   NORMAL  . ANTERIOR CERVICAL DECOMP/DISCECTOMY FUSION  06-30-2009   C3 -- C5  AND EXPLORATION OF FUSION C5-7 W/  PLATE REMOVAL  . BUNIONECTOMY Left   . CARDIOVASCULAR STRESS TEST  12/03/2019   myoc perf im: NORMAL  . CERVICAL FUSION  2002   anterior C5 -- C7  . CERVICAL SPINE SURGERY  08-27-1999     C6 -- T1  LAMINECTOMY/  DISKECTOMY  . CESAREAN SECTION  1994  . COLONOSCOPY W/ POLYPECTOMY  04/2008;08/2014   2016 tubular adenoma x 1; +diverticulosis and int/ext hemorrhoids.  Recall 5 yrs  . CYSTOSCOPY WITH URETEROSCOPY AND STENT PLACEMENT Left 08/28/2013   Procedure: CYSTOSCOPY, RGP,  WITH URETEROSCOPY AND STENT REMOVAL;  Surgeon: [name], MD;  Location: [facility];  Service: Urology;  Laterality: Left;  . DEXA  09/2017   T-score -2.2 femur neck: improved compared to 06/2015.  Marland Kitchen ESOPHAGOGASTRODUODENOSCOPY  2010; 08/2014    +Candidal esophagitis; Mild chronic gastritis w/intestinal metaplasia--NEG H pylori, neg for eosinophilic esoph  . HOLMIUM LASER APPLICATION Left 07/11/8117   Procedure: HOLMIUM LASER APPLICATION;  Surgeon: [name], MD;  Location: [facility];  Service: Urology;  Laterality: Left;  . LEFT HEART CATH AND CORONARY ANGIOGRAPHY N/A 05/15/2018   Evidence for spasm in distal LAD.  Mod RCA atherosclerosis, o/w no CAD.  Procedure: LEFT HEART CATH AND CORONARY ANGIOGRAPHY;  Surgeon: Leonie Man, MD;  Location: [place] [facility];  Service: Cardiovascular;  Laterality: N/A;  . NEGATIVE SLEEP STUDY  04-01-2007  . NEPHROLITHOTOMY Left 08/05/2013   Procedure: NEPHROLITHOTOMY PERCUTANEOUS;  Surgeon: [name], MD;  Location: [facility];  Service: Urology;  Laterality: Left;  . POLYSOMNOGRAM  01/2019   NORMAL  . POLYSOMNOGRAPHY  10/25/2018   Dr. Dion Saucier due to pt inadequat sleep time (83 min).  . TONSILLECTOMY AND ADENOIDECTOMY  1975  . TOTAL KNEE  ARTHROPLASTY Right 2003  . TRANSTHORACIC ECHOCARDIOGRAM  05/2018; 12/03/2019   (after MI secondary to arterial spasm): EF 45-50%; severe hypokinesis of apical anterolateral, inferolateral , anterior, and inferior LV myocardium. 11/2019 EF 55-60%, valves fine  . ULTRASOUND EXAM,PELVIC COMPLETE ([facility] HX)  06/25/15   NORMAL (done by GYN)    Outpatient Medications Prior to Visit  Medication Sig Dispense Refill  . Acetaminophen 500 MG coapsule Take 650 mg by mouth every 6 (six) hours as needed for mild pain or headache.     . albuterol (VENTOLIN HFA) 108 (90 Base) MCG/ACT inhaler Inhale 2 puffs into the lungs every 4 (four) hours as needed for wheezing or shortness of breath. 18 g 1  . ALPRAZolam (XANAX) 0.5 MG tablet TAKE 1 TABLET BY MOUTH 3 TIMES A DAY AS NEEDED FOR ANXIETY 90 tablet 1  . ANORO ELLIPTA 62.5-25 MCG/INH AEPB INHALE 1 PUFF BY MOUTH EVERY DAY 60 each 11  . aspirin EC 81 MG EC tablet Take 1 tablet (81 mg total)  by mouth daily. 90 tablet 3  . atorvastatin (LIPITOR) 80 MG tablet Take 1 tablet (80 mg total) by mouth daily at 6 PM. 90 tablet 3  . citalopram (CELEXA) 20 MG tablet Take 1 tablet (20 mg total) by mouth 2 (two) times daily. 180 tablet 0  . cyclobenzaprine (FLEXERIL) 10 MG tablet TAKE 1 TABLET BY MOUTH TWICE A DAY 60 tablet 6  . doxycycline (VIBRAMYCIN) 50 MG capsule Take 50 mg by mouth 2 (two) times daily.    . fluticasone (FLONASE) 50 MCG/ACT nasal spray SPRAY 2 SPRAYS INTO EACH NOSTRIL EVERY DAY 48 mL 2  . hydrochlorothiazide (HYDRODIURIL) 12.5 MG tablet Take 1 tablet (12.5 mg total) by mouth daily. 90 tablet 1  . hydrOXYzine (ATARAX/VISTARIL) 25 MG tablet 1-2 tabs po tid prn anxiety 60 tablet 1  . ipratropium-albuterol (DUONEB) 0.5-2.5 (3) MG/3ML SOLN Take 3 mLs by nebulization 3 (three) times daily as needed. 360 mL 3  . lamoTRIgine (LAMICTAL) 100 MG tablet TAKE 3 TABLETS (300 MG TOTAL) BY MOUTH AT BEDTIME. 270 tablet 0  . losartan (COZAAR) 25 MG tablet Take 1 tablet (25 mg total) by mouth daily. 90 tablet 1  . Magnesium 250 MG TABS Take 1 tablet by mouth daily.    . metoprolol succinate (TOPROL XL) 100 MG 24 hr tablet Take 1 tablet (100 mg total) by mouth daily. Take with or immediately following a meal. 90 tablet 3  . montelukast (SINGULAIR) 10 MG tablet TAKE 1 TABLET BY MOUTH EVERY DAY IN THE EVENING 90 tablet 3  . Multiple Vitamin (MULTI-VITAMINS) TABS Take 1 tablet by mouth daily.     . pantoprazole (PROTONIX) 40 MG tablet Take 1 tablet (40 mg total) by mouth daily. 60 tablet 0  . polyethylene glycol powder (GLYCOLAX/MIRALAX) powder TAKE 17 G BY MOUTH DAILY. 3162 g 0  . potassium citrate (UROCIT-K) 10 MEQ (1080 MG) SR tablet Take 10 mEq by mouth 2 (two) times a day.     Marland Kitchen Propylene Glycol (SYSTANE COMPLETE) 0.6 % SOLN Apply 1 drop to eye 2 (two) times daily.    . tamsulosin (FLOMAX) 0.4 MG CAPS capsule Take 0.4 mg by mouth daily.    Marland Kitchen tretinoin (RETIN-A) 0.025 % cream APPLY TO FACE AT  BEDTIME AS NEEDED    . triamcinolone cream (KENALOG) 0.1 % Apply 1 application topically daily.    . TURMERIC PO Take 1 tablet by mouth 2 (two) times a day.    Marland Kitchen  zolpidem (AMBIEN) 10 MG tablet TAKE 1 TABLET BY MOUTH EVERYDAY AT BEDTIME 90 tablet 1  . sucralfate (CARAFATE) 1 g tablet Take 1 tablet (1 g total) by mouth 4 (four) times daily -  with meals and at bedtime. 30 tablet 0  . nitroGLYCERIN (NITROSTAT) 0.4 MG SL tablet Place 1 tablet (0.4 mg total) under the tongue every 5 (five) minutes x 3 doses as needed for chest pain. (Patient not taking: Reported on 01/21/2020) 25 tablet 3  . famotidine (PEPCID) 40 MG tablet TAKE 1 TABLET BY MOUTH EVERY DAY (Patient not taking: Reported on 01/31/2020) 90 tablet 3  . promethazine (PHENERGAN) 12.5 MG tablet 1-2 tabs po q8h prn nausea (Patient not taking: Reported on 01/31/2020) 20 tablet 0   No facility-administered medications prior to visit.    Allergies  Allergen Reactions  . Ceftin [Cefuroxime Axetil] Anaphylaxis    Swelling of eyes and tongue  . Ciprofloxacin Anaphylaxis    Difficulty breathing and generalized swelling  . Fosamax [Alendronate Sodium] Diarrhea  . Morphine And Related Other (See Comments)    Hallucinations/ disoriented - pt stated, "Only Morphine" - 03/23/17  . Prednisone Other (See Comments)    Hyperactivity  . Seroquel [Quetiapine] Other (See Comments)    oversedation    ROS As per HPI  PE: Vitals with BMI 01/31/2020 01/21/2020 12/26/2019  Height 5\' 6"  5\' 6"  5\' 6"   Weight 181 lbs 3 oz 181 lbs 180 lbs 13 oz  BMI 29.26 38.93 73.4  Systolic 287 681 157  Diastolic 67 70 76  Pulse 60 70 100    Exam chaperoned by Deveron Furlong, CMA.  Gen: Alert, well appearing.  Patient is oriented to person, place, time, and situation. CV: RRR, no m/r/g.   LUNGS: CTA bilat, nonlabored resps, good aeration in all lung fields. ABD: soft, discomfort/tenderness to palpation in sub-xyphoid region w/out guarding or  rebound. Otherwise nontender.  No distention, mass, or bruit. BS normal.  No organomegaly.   LABS:  Lab Results  Component Value Date   TSH 1.55 02/19/2019   Lab Results  Component Value Date   WBC 5.6 01/21/2020   HGB 11.9 (L) 01/21/2020   HCT 34.4 (L) 01/21/2020   MCV 90.4 01/21/2020   PLT 250.0 01/21/2020   Lab Results  Component Value Date   IRON 84 04/29/2014   FERRITIN 27.6 04/29/2014   Lab Results  Component Value Date   VITAMINB12 688 12/11/2017    Lab Results  Component Value Date   CREATININE 0.94 01/21/2020   BUN 10 01/21/2020   NA 138 01/21/2020   K 3.5 01/21/2020   CL 99 01/21/2020   CO2 33 (H) 01/21/2020   Lab Results  Component Value Date   ALT 23 01/21/2020   AST 23 01/21/2020   ALKPHOS 63 01/21/2020   BILITOT 0.7 01/21/2020   Lab Results  Component Value Date   HGBA1C 5.6 08/13/2019   Lab Results  Component Value Date   LIPASE 50.0 01/21/2020     IMPRESSION AND PLAN:  Epigastric pain and nausea, gastritis vs symptomatic cholelithiasis with chronic cholecystitis. Treatment for gastritis the last 10d not helpful. Refer to gen surg for expert eval. Pt has called [facility] and got appt for mid December, which she will keep scheduled for now (she would really like EGD done, partially b/c she had a sister with esophageal cancer and is worried about this). D/c promethazine and try zofran 8mg  tid prn. D/c carafate, continue pantoprazole 40mg  qd.  An After Visit Summary was printed and given to the patient.  FOLLOW UP: Return for cancel appt for next month.  F/u 2 mo RCI/anxiety.  Signed:  Crissie Sickles, MD           01/31/2020

## 2020-02-06 ENCOUNTER — Telehealth: Payer: Self-pay | Admitting: Family Medicine

## 2020-02-06 NOTE — Telephone Encounter (Signed)
Please advise, thanks.

## 2020-02-06 NOTE — Telephone Encounter (Signed)
Patient states her surgeon's office is requesting [name] to call them. She says today she has stomach bloating, vomiting and green stool. She is scheduled for consult with Dr. Sherlene Shams for gallbladder issues on 03/09/20. Dr. Beverely Low number is [phone].

## 2020-02-06 NOTE — Telephone Encounter (Signed)
Spoke with patient and given symptoms she described of stomach feeling extended, vomiting and green stool; advised it would be best to be evaluated at ED. Soonest appointment they had at [facility] office was 12/6. She stated she could not wait that long due to severity of pain.

## 2020-02-06 NOTE — Telephone Encounter (Signed)
Pls call patient and get more details about the surgeon issue (usually if they want to talk to me they call me or their office calls and requests for me to call the surgeon.... Make sure everything is accurate before I start to try to chase down another doctor.-thx

## 2020-02-06 NOTE — Telephone Encounter (Signed)
I agree. I recommend pt go to the ED since she is acutely worsened since her recent visit with me. Signed:  Crissie Sickles, MD           02/06/2020

## 2020-02-07 ENCOUNTER — Other Ambulatory Visit: Payer: Self-pay | Admitting: Family Medicine

## 2020-02-07 NOTE — Telephone Encounter (Signed)
OK. Pls call [facility]--this is where Dr. Ninfa Linden is---and ask how we change her referral status to "urgent". If I need to talk to a surgeon there then let me know, that is fine. -thx

## 2020-02-07 NOTE — Telephone Encounter (Signed)
Spoke with someone at [facility] surgery and provided patient status update, her appointment was originally scheduled for 12/6 then moved to 11/18. After discussing with triage, they were able to move her appointment up to this Tuesday, 11/9 at 3:15 with Dr.Stechschulte. Patient was contacted and given updated appointment information. PCP was verbally made aware as well. Nothing further needed at this time.

## 2020-02-07 NOTE — Telephone Encounter (Signed)
FYI: She feels about the same, contacted Dr.Blackburn's office again and her new appointment is now the 15th. She states she could not wait 3 hours in the ED to be seen but does understand if pain becomes unbearable in between now and 15th that will be the next best step.

## 2020-02-10 NOTE — Telephone Encounter (Signed)
Requesting: cyclobenzaprine 10mg  Last Visit: 01/31/20, acute Next Visit: 03/30/20 Last Refill: 07/01/19, #60 w/ 6 RF  Please Advise

## 2020-02-11 DIAGNOSIS — K8050 Calculus of bile duct without cholangitis or cholecystitis without obstruction: Secondary | ICD-10-CM | POA: Diagnosis not present

## 2020-02-12 ENCOUNTER — Telehealth: Payer: Self-pay | Admitting: *Deleted

## 2020-02-12 ENCOUNTER — Other Ambulatory Visit: Payer: Self-pay | Admitting: Family Medicine

## 2020-02-12 NOTE — Telephone Encounter (Signed)
   Primary Cardiologist: Fransico Him, MD  Chart reviewed as part of pre-operative protocol coverage. Patient was contacted 02/12/2020 in reference to pre-operative risk assessment for pending surgery as outlined below.  KYNSIE FALKNER was last seen on 12/11/19 by Dr. Radford Pax via telemedicine.  Since that day, ANNTOINETTE HAEFELE has done fine from a cardiac standpoint. She has chronic DOE 2/2 COPD which is unchanged. She can complete 4 METs without anginal complaints.  Therefore, based on ACC/AHA guidelines, the patient would be at acceptable risk for the planned procedure without further cardiovascular testing.   The patient was advised that if she develops new symptoms prior to surgery to contact our office to arrange for a follow-up visit, and she verbalized understanding.  In regards to her aspirin, she tells me she has been off this medication for ~1 week due to stomach irritation. Recommended restarting this medication following her cholecystectomy if reflux issues have stabilized.   I will route this recommendation to the requesting party via Epic fax function and remove from pre-op pool. Please call with questions.  Abigail Butts, PA-C 02/12/2020, 1:28 PM

## 2020-02-12 NOTE — Telephone Encounter (Signed)
   [facility] Pre-operative Risk Assessment    HEARTCARE STAFF: - Please ensure there is not already an duplicate clearance open for this procedure. - Under Visit Info/Reason for Call, type in Other and utilize the format Clearance MM/DD/YY or Clearance TBD. Do not use dashes or single digits. - If request is for dental extraction, please clarify the # of teeth to be extracted.  Request for surgical clearance:  1. What type of surgery is being performed? Gallbladder surgery   2. When is this surgery scheduled? TBD   3. What type of clearance is required (medical clearance vs. Pharmacy clearance to hold med vs. Both)? Medical  4. Are there any medications that need to be held prior to surgery and how long?Asa 81 mg   5. Practice name and name of physician performing surgery? [facility], Dr Eddie Dibbles Stechschulte   6. What is the office phone number? [phone]    7.   What is the office fax number? [place], RMA  8.   Anesthesia type (None, local, MAC, general) ? General   Jayley Hustead L 02/12/2020, 8:20 AM  _________________________________________________________________   (provider comments below)

## 2020-02-13 ENCOUNTER — Ambulatory Visit: Payer: Medicare Other | Admitting: Family Medicine

## 2020-02-13 ENCOUNTER — Telehealth: Payer: Self-pay | Admitting: Family Medicine

## 2020-02-13 NOTE — Telephone Encounter (Signed)
Left message for patient to schedule Annual Wellness Visit.  Please schedule with Nurse Health Advisor Martha Stanley, RN at [facility]  °

## 2020-02-28 NOTE — Progress Notes (Signed)
[id number] - [place], [address] [place] [address] Phone: [phone] Fax: [phone]      Your procedure is scheduled on 03/06/2020.  Report to [facility] Main Entrance "A" at 07:00 A.M., and check in at the Admitting office.  Call this number if you have problems the morning of surgery:  [phone]  Call [phone] if you have any questions prior to your surgery date Monday-Friday 8am-4pm    Remember:  Do not eat or drink after midnight the night before your surgery  You may drink clear liquids until 06:00am the morning of your surgery.   Clear liquids allowed are: Water, Non-Citrus Juices (without pulp), Carbonated Beverages, Clear Tea, Black Coffee Only, and Gatorade    Take these medicines the morning of surgery with A SIP OF WATER  ANORO ELLIPTA - please bring inhalers with you the day of surgery. citalopram (CELEXA) doxycycline (VIBRAMYCIN) fluticasone (FLONASE) metoprolol succinate (TOPROL XL)  ondansetron (ZOFRAN) pantoprazole (PROTONIX)  Take the following if needed the morning of surgery.  acetaminophen (TYLENOL)  albuterol (VENTOLIN HFA) inhaler: Please bring inhalers with you the day of surgery. ALPRAZolam (XANAX) cyclobenzaprine (FLEXERIL) nitroGLYCERIN (NITROSTAT)   As of today, STOP taking any Aspirin (unless otherwise instructed by your surgeon) Aleve, Naproxen, Ibuprofen, Motrin, Advil, Goody's, BC's, all herbal medications, fish oil, and all vitamins.                        Do not wear jewelry, make up, or nail polish            Do not wear lotions, powders, perfumes/colognes, or deodorant.            Do not shave 48 hours prior to surgery.             Do not bring valuables to the hospital.            [facility] is not responsible for any belongings or valuables.  Do NOT Smoke (Tobacco/Vaping) or drink Alcohol 24 hours prior to your procedure If you use a CPAP at night, you may bring all equipment for  your overnight stay.   Contacts, glasses, dentures or bridgework may not be worn into surgery.      For patients admitted to the hospital, discharge time will be determined by your treatment team.   Patients discharged the day of surgery will not be allowed to drive home, and someone needs to stay with them for 24 hours.    Special instructions:   [facility]- Preparing For Surgery  Before surgery, you can play an important role. Because skin is not sterile, your skin needs to be as free of germs as possible. You can reduce the number of germs on your skin by washing with CHG (chlorahexidine gluconate) Soap before surgery.  CHG is an antiseptic cleaner which kills germs and bonds with the skin to continue killing germs even after washing.    Oral Hygiene is also important to reduce your risk of infection.  Remember - BRUSH YOUR TEETH THE MORNING OF SURGERY WITH YOUR REGULAR TOOTHPASTE  Please do not use if you have an allergy to CHG or antibacterial soaps. If your skin becomes reddened/irritated stop using the CHG.  Do not shave (including legs and underarms) for at least 48 hours prior to first CHG shower. It is OK to shave your face.  Please follow these instructions carefully.   1. Shower the [employer] BEFORE SURGERY  and the MORNING OF SURGERY with CHG Soap.   2. If you chose to wash your hair, wash your hair first as usual with your normal shampoo.  3. After you shampoo, rinse your hair and body thoroughly to remove the shampoo.  4. Use CHG as you would any other liquid soap. You can apply CHG directly to the skin and wash gently with a scrungie or a clean washcloth.   5. Apply the CHG Soap to your body ONLY FROM THE NECK DOWN.  Do not use on open wounds or open sores. Avoid contact with your eyes, ears, mouth and genitals (private parts). Wash Face and genitals (private parts)  with your normal soap.   6. Wash thoroughly, paying special attention to the area where your surgery will  be performed.  7. Thoroughly rinse your body with warm water from the neck down.  8. DO NOT shower/wash with your normal soap after using and rinsing off the CHG Soap.  9. Pat yourself dry with a CLEAN TOWEL.  10. Wear CLEAN PAJAMAS to bed the night before surgery  11. Place CLEAN SHEETS on your bed the night of your first shower and DO NOT SLEEP WITH PETS.   Day of Surgery: Wear Clean/Comfortable clothing the morning of surgery Do not apply any deodorants/lotions.   Remember to brush your teeth WITH YOUR REGULAR TOOTHPASTE.   Please read over the following fact sheets that you were given.

## 2020-03-02 ENCOUNTER — Encounter (HOSPITAL_COMMUNITY)
Admission: RE | Admit: 2020-03-02 | Discharge: 2020-03-02 | Disposition: A | Discharge status: Home / Self Care | Payer: Medicare Other | Source: Ambulatory Visit | Attending: Surgery | Admitting: Surgery

## 2020-03-02 ENCOUNTER — Ambulatory Visit: Payer: Self-pay | Admitting: Surgery

## 2020-03-02 ENCOUNTER — Encounter (HOSPITAL_COMMUNITY): Payer: Self-pay

## 2020-03-02 ENCOUNTER — Other Ambulatory Visit: Payer: Self-pay

## 2020-03-02 DIAGNOSIS — F1011 Alcohol abuse, in remission: Secondary | ICD-10-CM | POA: Insufficient documentation

## 2020-03-02 DIAGNOSIS — Z8619 Personal history of other infectious and parasitic diseases: Secondary | ICD-10-CM | POA: Insufficient documentation

## 2020-03-02 DIAGNOSIS — Z981 Arthrodesis status: Secondary | ICD-10-CM | POA: Insufficient documentation

## 2020-03-02 DIAGNOSIS — K8010 Calculus of gallbladder with chronic cholecystitis without obstruction: Secondary | ICD-10-CM | POA: Diagnosis not present

## 2020-03-02 DIAGNOSIS — I252 Old myocardial infarction: Secondary | ICD-10-CM | POA: Insufficient documentation

## 2020-03-02 DIAGNOSIS — I2510 Atherosclerotic heart disease of native coronary artery without angina pectoris: Secondary | ICD-10-CM | POA: Diagnosis not present

## 2020-03-02 DIAGNOSIS — Z87891 Personal history of nicotine dependence: Secondary | ICD-10-CM | POA: Insufficient documentation

## 2020-03-02 DIAGNOSIS — J449 Chronic obstructive pulmonary disease, unspecified: Secondary | ICD-10-CM | POA: Diagnosis not present

## 2020-03-02 DIAGNOSIS — I69911 Memory deficit following unspecified cerebrovascular disease: Secondary | ICD-10-CM | POA: Insufficient documentation

## 2020-03-02 DIAGNOSIS — R7303 Prediabetes: Secondary | ICD-10-CM | POA: Diagnosis not present

## 2020-03-02 DIAGNOSIS — K219 Gastro-esophageal reflux disease without esophagitis: Secondary | ICD-10-CM | POA: Diagnosis not present

## 2020-03-02 DIAGNOSIS — F319 Bipolar disorder, unspecified: Secondary | ICD-10-CM | POA: Diagnosis not present

## 2020-03-02 DIAGNOSIS — I255 Ischemic cardiomyopathy: Secondary | ICD-10-CM | POA: Diagnosis not present

## 2020-03-02 DIAGNOSIS — K760 Fatty (change of) liver, not elsewhere classified: Secondary | ICD-10-CM | POA: Diagnosis not present

## 2020-03-02 DIAGNOSIS — I10 Essential (primary) hypertension: Secondary | ICD-10-CM | POA: Insufficient documentation

## 2020-03-02 DIAGNOSIS — E785 Hyperlipidemia, unspecified: Secondary | ICD-10-CM | POA: Insufficient documentation

## 2020-03-02 DIAGNOSIS — Z7982 Long term (current) use of aspirin: Secondary | ICD-10-CM | POA: Diagnosis not present

## 2020-03-02 DIAGNOSIS — Z01812 Encounter for preprocedural laboratory examination: Secondary | ICD-10-CM | POA: Insufficient documentation

## 2020-03-02 DIAGNOSIS — Z79899 Other long term (current) drug therapy: Secondary | ICD-10-CM | POA: Diagnosis not present

## 2020-03-02 DIAGNOSIS — K469 Unspecified abdominal hernia without obstruction or gangrene: Secondary | ICD-10-CM | POA: Diagnosis not present

## 2020-03-02 DIAGNOSIS — M797 Fibromyalgia: Secondary | ICD-10-CM | POA: Insufficient documentation

## 2020-03-02 HISTORY — DX: Depression, unspecified: F32.A

## 2020-03-02 HISTORY — DX: Pneumonia, unspecified organism: J18.9

## 2020-03-02 HISTORY — DX: Anemia, unspecified: D64.9

## 2020-03-02 HISTORY — DX: Cerebral infarction, unspecified: I63.9

## 2020-03-02 HISTORY — DX: Personal history of urinary calculi: Z87.442

## 2020-03-02 LAB — CBC
HCT: 33.7 % — ABNORMAL LOW (ref 36.0–46.0)
Hemoglobin: 10.9 g/dL — ABNORMAL LOW (ref 12.0–15.0)
MCH: 30.3 pg (ref 26.0–34.0)
MCHC: 32.3 g/dL (ref 30.0–36.0)
MCV: 93.6 fL (ref 80.0–100.0)
Platelets: 233 10*3/uL (ref 150–400)
RBC: 3.6 MIL/uL — ABNORMAL LOW (ref 3.87–5.11)
RDW: 12.9 % (ref 11.5–15.5)
WBC: 6.1 10*3/uL (ref 4.0–10.5)
nRBC: 0 % (ref 0.0–0.2)

## 2020-03-02 LAB — SURGICAL PCR SCREEN
MRSA, PCR: NEGATIVE
Staphylococcus aureus: NEGATIVE

## 2020-03-02 LAB — COMPREHENSIVE METABOLIC PANEL
ALT: 27 U/L (ref 0–44)
AST: 27 U/L (ref 15–41)
Albumin: 4.3 g/dL (ref 3.5–5.0)
Alkaline Phosphatase: 56 U/L (ref 38–126)
Anion gap: 10 (ref 5–15)
BUN: 11 mg/dL (ref 8–23)
CO2: 27 mmol/L (ref 22–32)
Calcium: 9.2 mg/dL (ref 8.9–10.3)
Chloride: 100 mmol/L (ref 98–111)
Creatinine, Ser: 1.1 mg/dL — ABNORMAL HIGH (ref 0.44–1.00)
GFR, Estimated: 55 mL/min — ABNORMAL LOW (ref >60)
Glucose, Bld: 95 mg/dL (ref 70–99)
Potassium: 3.9 mmol/L (ref 3.5–5.1)
Sodium: 137 mmol/L (ref 135–145)
Total Bilirubin: 1 mg/dL (ref 0.3–1.2)
Total Protein: 6.3 g/dL — ABNORMAL LOW (ref 6.5–8.1)

## 2020-03-02 NOTE — Progress Notes (Signed)
[id number] - [place], [address] [place] [address] Phone: [phone] Fax: [phone]      Your procedure is scheduled on 03/06/2020.  Report to [facility] Main Entrance "A" at 07:00 A.M., and check in at the Admitting office.  Call this number if you have problems the morning of surgery:  [phone]  Call [phone] if you have any questions prior to your surgery date Monday-Friday 8am-4pm    Remember:  Do not eat or drink after midnight the night before your surgery  You may drink clear liquids until 06:00am the morning of your surgery.   Clear liquids allowed are: Water, Non-Citrus Juices (without pulp), Carbonated Beverages, Clear Tea, Black Coffee Only, and Gatorade    Take these medicines the morning of surgery with A SIP OF WATER  ANORO ELLIPTA - please bring inhalers with you the day of surgery. citalopram (CELEXA) doxycycline (VIBRAMYCIN) fluticasone (FLONASE) metoprolol succinate (TOPROL XL)  ondansetron (ZOFRAN) pantoprazole (PROTONIX) Propylene Glycol (SYSTANE COMPLETE)   Take the following if needed the morning of surgery.  acetaminophen (TYLENOL)  albuterol (VENTOLIN HFA) inhaler: Please bring inhalers with you the day of surgery. ALPRAZolam (XANAX) cyclobenzaprine (FLEXERIL) nitroGLYCERIN (NITROSTAT)   Follow your surgeon's instructions on when to stop Aspirin.  If no instructions were given by your surgeon then you will need to call the office to get those instructions.     As of today, STOP taking any Aleve, Naproxen, Ibuprofen, Motrin, Advil, Goody's, BC's, all herbal medications, fish oil, and all vitamins.                     Do not wear jewelry, make up, or nail polish            Do not wear lotions, powders, perfumes/colognes, or deodorant.            Do not shave 48 hours prior to surgery.             Do not bring valuables to the hospital.            [facility] is not responsible for any belongings  or valuables.  Do NOT Smoke (Tobacco/Vaping) or drink Alcohol 24 hours prior to your procedure If you use a CPAP at night, you may bring all equipment for your overnight stay.   Contacts, glasses, dentures or bridgework may not be worn into surgery.      For patients admitted to the hospital, discharge time will be determined by your treatment team.   Patients discharged the day of surgery will not be allowed to drive home, and someone needs to stay with them for 24 hours.    Special instructions:   [place]- Preparing For Surgery  Before surgery, you can play an important role. Because skin is not sterile, your skin needs to be as free of germs as possible. You can reduce the number of germs on your skin by washing with CHG (chlorahexidine gluconate) Soap before surgery.  CHG is an antiseptic cleaner which kills germs and bonds with the skin to continue killing germs even after washing.    Oral Hygiene is also important to reduce your risk of infection.  Remember - BRUSH YOUR TEETH THE MORNING OF SURGERY WITH YOUR REGULAR TOOTHPASTE  Please do not use if you have an allergy to CHG or antibacterial soaps. If your skin becomes reddened/irritated stop using the CHG.  Do not shave (including legs and underarms) for at  least 48 hours prior to first CHG shower. It is OK to shave your face.  Please follow these instructions carefully.   1. Shower the NIGHT BEFORE SURGERY and the MORNING OF SURGERY with CHG Soap.   2. If you chose to wash your hair, wash your hair first as usual with your normal shampoo.  3. After you shampoo, rinse your hair and body thoroughly to remove the shampoo.  4. Use CHG as you would any other liquid soap. You can apply CHG directly to the skin and wash gently with a scrungie or a clean washcloth.   5. Apply the CHG Soap to your body ONLY FROM THE NECK DOWN.  Do not use on open wounds or open sores. Avoid contact with your eyes, ears, mouth and genitals (private  parts). Wash Face and genitals (private parts)  with your normal soap.   6. Wash thoroughly, paying special attention to the area where your surgery will be performed.  7. Thoroughly rinse your body with warm water from the neck down.  8. DO NOT shower/wash with your normal soap after using and rinsing off the CHG Soap.  9. Pat yourself dry with a CLEAN TOWEL.  10. Wear CLEAN PAJAMAS to bed the night before surgery  11. Place CLEAN SHEETS on your bed the night of your first shower and DO NOT SLEEP WITH PETS.   Day of Surgery: Wear Clean/Comfortable clothing the morning of surgery Do not apply any deodorants/lotions.   Remember to brush your teeth WITH YOUR REGULAR TOOTHPASTE.   Please read over the following fact sheets that you were given.

## 2020-03-02 NOTE — Progress Notes (Signed)
PCP - Dr. Shawnie Dapper Cardiologist - Dr. Fransico Him Pulmonologist: Dr. Marshell Garfinkel  PPM/ICD - Denies  Chest x-ray - N/A EKG - 10/21/19 Stress Test - 12/03/19 ECHO - 12/03/19 Cardiac Cath - 05/15/18  Sleep Study - Yes, Negative  Patient denies having diabetes.  Blood Thinner Instructions: N/A Aspirin Instructions: LD: 03/01/20  ERAS Protcol - Yes PRE-SURGERY Ensure or G2- No  COVID TEST- 03/03/20   Coronavirus Screening  Have you experienced the following symptoms:  Cough yes/no: No Fever (>100.64F)  yes/no: No Runny nose yes/no: No Sore throat yes/no: No Difficulty breathing/shortness of breath  yes/no: No  Have you or a family member traveled in the last 14 days and where? yes/no: No   If the patient indicates "YES" to the above questions, their PAT will be rescheduled to limit the exposure to others and, the surgeon will be notified. THE PATIENT WILL NEED TO BE ASYMPTOMATIC FOR 14 DAYS.   If the patient is not experiencing any of these symptoms, the PAT nurse will instruct them to NOT bring anyone with them to their appointment since they may have these symptoms or traveled as well.   Please remind your patients and families that hospital visitation restrictions are in effect and the importance of the restrictions.     Anesthesia review: Yes, cardiac hx, cardiac clearance in Epic 02/12/20  Patient denies shortness of breath, fever, cough and chest pain at PAT appointment   All instructions explained to the patient, with a verbal understanding of the material. Patient agrees to go over the instructions while at home for a better understanding. Patient also instructed to self quarantine after being tested for COVID-19. The opportunity to ask questions was provided.

## 2020-03-03 ENCOUNTER — Other Ambulatory Visit (HOSPITAL_COMMUNITY)
Admission: RE | Admit: 2020-03-03 | Discharge: 2020-03-03 | Disposition: A | Discharge status: Home / Self Care | Payer: Medicare Other | Source: Ambulatory Visit | Attending: Surgery | Admitting: Surgery

## 2020-03-03 DIAGNOSIS — Z20822 Contact with and (suspected) exposure to covid-19: Secondary | ICD-10-CM | POA: Insufficient documentation

## 2020-03-03 DIAGNOSIS — Z01812 Encounter for preprocedural laboratory examination: Secondary | ICD-10-CM | POA: Insufficient documentation

## 2020-03-03 LAB — SARS CORONAVIRUS 2 (TAT 6-24 HRS): SARS Coronavirus 2: NEGATIVE

## 2020-03-03 NOTE — Progress Notes (Signed)
Anesthesia Chart Review:  Case: [id number] Date/Time: 03/06/20 0845   Procedure: LAPAROSCOPIC CHOLECYSTECTOMY (N/A )   Anesthesia type: General   Pre-op diagnosis: calculous cholecystitis   Location: [facility] OR ROOM 02 / [facility]   Surgeons: Stechschulte, Nickola Major, MD      DISCUSSION: Patient is a 69 year old female scheduled for the above procedure.  History includes former smoker (quit 08/22/93), CAD/coronary vasospasm (NSTEMI (05/14/18 due to vasospasm with mild-moderate CAD, medical therapy), ischemic cardiomyopathy, dyspnea, HTN, HLD, COPD, Bipolar 1 disorder, fibromyalgia, hiatal hernia, GERD, H. Pylori (2010), TMJ, hepatic steatosis, prediabetes, skin cancer (BCC excision, nose), anemia, TIA (1994 in setting of HELLP syndrome with residual memory loss), neck surgery (C6-T1 foraminotomies/laminectomy 08/27/99; C5-7 arthrodesis; C3-5 ACDF 06/30/09, back surgery, previous alcohol abuse (in remission since 2004). Reported waking up during a procedure in the past.   Preoperative cardiology input outlined on 02/12/20 by Roby Lofts, PA-C. She wrote, "Erin Lopez was last seen on 12/11/19 by Dr. Radford Pax via telemedicine.  Since that day, Erin Lopez has done fine from a cardiac standpoint. She has chronic DOE 2/2 COPD which is unchanged. She can complete 4 METs without anginal complaints.  Therefore, based on ACC/AHA guidelines, the patient would be at acceptable risk for the planned procedure without further cardiovascular testing." Patient reported last ASA 03/01/20.  03/03/20 presurgical COVID-19 test is in process.  Anesthesia team to evaluate on the day of surgery.   VS: BP (P) 136/89   Pulse (P) 70   Temp (!) (P) 36.3 C   Resp (P) 18   Ht (P) 5\' 7"  (1.702 m)   Wt (P) 82.7 kg   SpO2 (P) 94%   BMI (P) 28.57 kg/m    PROVIDERS: McGowen, Adrian Blackwater, MD is PCP  Fransico Him, MD is cardiologist Marshell Garfinkel, MD is pulmonologist. Last visit 12/26/19.    LABS: Labs reviewed:  Acceptable for surgery. A1c 5.6% 08/13/19.  (all labs ordered are listed, but only abnormal results are displayed)  Labs Reviewed  COMPREHENSIVE METABOLIC PANEL - Abnormal; Notable for the following components:      Result Value   Creatinine, Ser 1.10 (*)    Total Protein 6.3 (*)    GFR, Estimated 55 (*)    All other components within normal limits  CBC - Abnormal; Notable for the following components:   RBC 3.60 (*)    Hemoglobin 10.9 (*)    HCT 33.7 (*)    All other components within normal limits  SURGICAL PCR SCREEN    PFTs 06/12/17: FVC 2.87 (80%), post 2.83 (79%). FEV1 1.56 (57%), post 1.69 (62%). DLCO unc 12.24 (43%). Moderate obstructive defect, severe diffusion impairment.   IMAGES: CXR 06/05/19: FINDINGS: Lung volumes are normal. No consolidative airspace disease. No pleural effusions. No pneumothorax. No pulmonary nodule or mass noted. Pulmonary vasculature and the cardiomediastinal silhouette are within normal limits. Aortic atherosclerosis. Orthopedic fixation hardware in the lower cervical spine incidentally noted. IMPRESSION: 1.  No radiographic evidence of acute cardiopulmonary disease. 2. Aortic atherosclerosis.   EKG: 10/1919: NSR   CV: Nuclear stress test 12/03/19:  The left ventricular ejection fraction is hyperdynamic (>65%).  Nuclear stress EF: 66%.  There was no ST segment deviation noted during stress.  The study is normal.  This is a low risk study. Normal resting and stress perfusion. No ischemia or infarction EF 66%   Echo 12/03/19: IMPRESSIONS   1. Left ventricular ejection fraction, by estimation, is 55 to 60%. The  left ventricle  has normal function. Left ventricular endocardial border  not optimally defined to evaluate regional wall motion. There is mild left  ventricular hypertrophy. Left  ventricular diastolic parameters are indeterminate.  2. Right ventricular systolic function is normal. The right ventricular  size is normal.  There is mildly elevated pulmonary artery systolic  pressure. The estimated right ventricular systolic pressure is 37.9 mmHg.  3. The mitral valve is normal in structure. Trivial mitral valve  regurgitation. No evidence of mitral stenosis.  4. The aortic valve is tricuspid. Aortic valve regurgitation is not  visualized. No aortic stenosis is present.  5. The inferior vena cava is normal in size with greater than 50%  respiratory variability, suggesting right atrial pressure of 3 mmHg. (Comparison 05/15/18: LVEF 45-50%)   Cardiac cath 05/15/18:  The left ventricular systolic function is low normal: EF estimated 50 and 55% with distal anterior-anterior apical hypokinesis.  Dist LAD lesion is 60 % stenosed. Following IC NTG reduced to~30% diffuse disease consistent with spasm  Prox RCA to Mid RCA lesion is 45% stenosed.   SUMMARY  Distal anterior-anteroapical hypokinesis with NTG responsive distal LAD spasm  Moderate RCA disease  Otherwise minimal CAD  RECOMMENDATIONS  Start long-acting nitrate, consider amlodipine  Continue aggressive risk factor medication    Past Medical History:  Diagnosis Date  . Alcoholism in remission [facility])    states last alcohol 11 yrs ago  . Anemia   . Anxiety   . Asymptomatic gallstones    u/s 03/2019 (noted on imaging prior to that though)  . Bipolar 1 disorder ([place])    Admission for mania x 2 (most recent 11/2017)  . Cervical spondylosis without myelopathy 12/19/2013   MRI 02/2014: multilevel DDD/spondylosis, not much change compared to prior MRI.  Pt not in favor of invasive therapy for her neck as of 04/2014.  Marland Kitchen Complication of anesthesia    Patient woke up during a procedure in the past.  . COPD (chronic obstructive pulmonary disease) (HCC)    Moderate: anoro started 04/2017 by pulm, pt symptomatically improved and PFTs stable at f/u 06/2017.  Marland Kitchen Coronary artery spasm ([place]) 07/11/2018   nitrates=HA. Amlodipine=intol swelling.  Changed to  coreg 09/03/18 by cardiologist.  . Depression   . Dilutional hyponatremia   . Endometritis 05/2017   possible; empiric tx with Flagyl by GYN.  . Environmental allergies   . Fibromyalgia   . GERD (gastroesophageal reflux disease)   . Hepatic steatosis 03/2019   u/s abd  . Herpes zoster 08/07/2017   L scapular region  . Hiatal hernia   . History of adenomatous polyp of colon 04/2008; 08/2014   No high grade dysplasia: recall 5 yrs ([name], [name])  . History of basal cell carcinoma excision    NOSE  . History of Helicobacter pylori infection 04/2008   +gastric biopsy (gastritis but no metaplasia, dysplasia, or malignancy identified)  . History of kidney stones   . History of TIA (transient ischemic attack)    secondary to HELLP syndrome 1994 post c/s--  residual memory loss  . Hyperlipidemia    statin started after her NSTEMI: goal  LDL <70.  Marland Kitchen Hypertension   . Internal hemorrhoids   . Ischemic cardiomyopathy 05/2018   Echo EF 45-50% in context of NSTEMI from CA spasm. // Echo 8/21: 55-60, mild LVH, normal RVSF, RVSP 40, trivial MR  . Left foot pain    Hallux deformity+adhesive capsulitis+ hammertoe:  Severe structural bunion deformity with hallux interphalangeus and severe  arthritis of the second MPJ left foot--surgical repair/osteotomies by [name] 04/2017.  . Leg pain    ABIs 11/2019: normal   . Memory loss    Abnl MRI brain and CT brain c/w chronic microvascular ischemia.  Repeat MRI brain 02/2014 stable (Dr. Mayra Reel with no plan to f/u with neuro as of 04/2014)  . NSTEMI (non-ST elevated myocardial infarction) ([place]) 05/2018   Echo EF 45-50% in context of NSTEMI from CA spasm.// Myoview 8/21: EF 66, no ischemia, low risk  . Osteoporosis 05/2010   2012 'penia; 06/2015 'porosis:  prolia.  DEXA 09/2017 T-score -2.2 femur neck.  As of 11/2018 we're restarting prolia soon and will check next DEXA 1 yr from her next prolia injection.  . Pelvic floor dysfunction    [facility]  urol ([name])  . Pneumonia   . Postmenopausal vaginal bleeding 05/2017   x 1 small episode; GYN attempted endo bx but unable to penetrate cervix due to severe cerv stenosis (due to postmenopausal state + hx of LEEP).  Endo u/s showed thin endo lining.  Per GYN---no evidence of endomet pathology on w/u---obs as of 07/2017.  Marland Kitchen Prediabetes 06/2016   Fasting gluc 105; HbA1c at that time was 6.0%.  A1c 6.2% 12/2017.  Marland Kitchen Recurrent kidney stones 08/2013   Left ureteral calculus: Perc nephr--cystoscopy w/ureteroscopy + laser for stone removal.  Residual asymptomatic left renal nephrolithiasis <30mm present post-procedure.  Right sided hydronephrosis-persistent (on u/s)--urol ordered CT to further eval 08/2016: 1.6 cm nonobstructing stone lower pole left kidney, no hydro, plan for PCN extraction ([facility])  . Shortness of breath   . Sprain of neck 12/19/2013  . Stroke [facility])    TIA  . TMJ (temporomandibular joint disorder)    USES  MOUTH GUARD  . Toe fracture, left 12/2017   2nd toe prox phalanx (immobilization, [name])  . Vitamin D deficiency 05/2011  . Weak urinary stream 07/2016   with elevated PVR and mild left hydronephrosis secondary to this ([facility] started flomax 0.4mg  qd for this).    Past Surgical History:  Procedure Laterality Date  . ABIs  11/29/2019   NORMAL  . ANTERIOR CERVICAL DECOMP/DISCECTOMY FUSION  06-30-2009   C3 -- C5  AND EXPLORATION OF FUSION C5-7 W/  PLATE REMOVAL  . BACK SURGERY    . BUNIONECTOMY Left   . CARDIOVASCULAR STRESS TEST  12/03/2019   myoc perf im: NORMAL  . CERVICAL FUSION  2002   anterior C5 -- C7  . CERVICAL SPINE SURGERY  08-27-1999     C6 -- T1  LAMINECTOMY/  DISKECTOMY  . CESAREAN SECTION  1994  . COLONOSCOPY W/ POLYPECTOMY  04/2008;08/2014   2016 tubular adenoma x 1; +diverticulosis and int/ext hemorrhoids.  Recall 5 yrs  . CYSTOSCOPY WITH URETEROSCOPY AND STENT PLACEMENT Left 08/28/2013   Procedure: CYSTOSCOPY, RGP,  WITH URETEROSCOPY AND  STENT REMOVAL;  Surgeon: [name], MD;  Location: [facility];  Service: Urology;  Laterality: Left;  . DEXA  09/2017   T-score -2.2 femur neck: improved compared to 06/2015.  Marland Kitchen ESOPHAGOGASTRODUODENOSCOPY  2010; 08/2014   +Candidal esophagitis; Mild chronic gastritis w/intestinal metaplasia--NEG H pylori, neg for eosinophilic esoph  . HOLMIUM LASER APPLICATION Left 06/17/1759   Procedure: HOLMIUM LASER APPLICATION;  Surgeon: [name], MD;  Location[facility];  Service: Urology;  Laterality: Left;  . JOINT REPLACEMENT    . LEFT HEART CATH AND CORONARY ANGIOGRAPHY N/A 05/15/2018   Evidence for  spasm in distal LAD.  Mod RCA atherosclerosis, o/w no CAD.  Procedure: LEFT HEART CATH AND CORONARY ANGIOGRAPHY;  Surgeon: Leonie Man, MD;  Location: [place] [facility];  Service: Cardiovascular;  Laterality: N/A;  . NEGATIVE SLEEP STUDY  04-01-2007  . NEPHROLITHOTOMY Left 08/05/2013   Procedure: NEPHROLITHOTOMY PERCUTANEOUS;  Surgeon: [name], MD;  Location: [facility];  Service: Urology;  Laterality: Left;  . POLYSOMNOGRAM  01/2019   NORMAL  . POLYSOMNOGRAPHY  10/25/2018   Dr. Dion Saucier due to pt inadequat sleep time (83 min).  . TONSILLECTOMY AND ADENOIDECTOMY  1975  . TOTAL KNEE ARTHROPLASTY Right 2003  . TRANSTHORACIC ECHOCARDIOGRAM  05/2018; 12/03/2019   (after MI secondary to arterial spasm): EF 45-50%; severe hypokinesis of apical anterolateral, inferolateral , anterior, and inferior LV myocardium. 11/2019 EF 55-60%, valves fine  . ULTRASOUND EXAM,PELVIC COMPLETE ([facility] HX)  06/25/15   NORMAL (done by GYN)    MEDICATIONS: . acetaminophen (TYLENOL) 500 MG tablet  . albuterol (VENTOLIN HFA) 108 (90 Base) MCG/ACT inhaler  . ALPRAZolam (XANAX) 0.5 MG tablet  . ANORO ELLIPTA 62.5-25 MCG/INH AEPB  . aspirin EC 81 MG EC tablet  . atorvastatin (LIPITOR) 80 MG tablet  . citalopram (CELEXA) 20 MG tablet  . cyclobenzaprine (FLEXERIL) 10  MG tablet  . doxycycline (VIBRAMYCIN) 50 MG capsule  . fluticasone (FLONASE) 50 MCG/ACT nasal spray  . hydrochlorothiazide (HYDRODIURIL) 12.5 MG tablet  . hydrOXYzine (ATARAX/VISTARIL) 25 MG tablet  . ipratropium-albuterol (DUONEB) 0.5-2.5 (3) MG/3ML SOLN  . lamoTRIgine (LAMICTAL) 100 MG tablet  . losartan (COZAAR) 25 MG tablet  . Magnesium 250 MG TABS  . metoprolol succinate (TOPROL XL) 100 MG 24 hr tablet  . montelukast (SINGULAIR) 10 MG tablet  . Multiple Vitamin (MULTI-VITAMINS) TABS  . nitroGLYCERIN (NITROSTAT) 0.4 MG SL tablet  . ondansetron (ZOFRAN) 8 MG tablet  . pantoprazole (PROTONIX) 40 MG tablet  . polyethylene glycol powder (GLYCOLAX/MIRALAX) powder  . potassium citrate (UROCIT-K) 10 MEQ (1080 MG) SR tablet  . Propylene Glycol (SYSTANE COMPLETE) 0.6 % SOLN  . tamsulosin (FLOMAX) 0.4 MG CAPS capsule  . tretinoin (RETIN-A) 0.025 % cream  . triamcinolone cream (KENALOG) 0.1 %  . TURMERIC PO  . zolpidem (AMBIEN) 10 MG tablet   No current facility-administered medications for this encounter.  Not currently taking hydroxyzine, Flomax or turmeric.   Myra Gianotti, PA-C Surgical Short Stay/Anesthesiology [facility] Phone [phone] [facility] Phone [phone] 03/03/2020 3:02 PM

## 2020-03-03 NOTE — Anesthesia Preprocedure Evaluation (Addendum)
Anesthesia Evaluation  Patient identified by MRN, date of birth, ID band Patient awake    Reviewed: Allergy & Precautions, NPO status , Patient's Chart, lab work & pertinent test results  History of Anesthesia Complications Negative for: history of anesthetic complications  Airway Mallampati: II  TM Distance: >3 FB Neck ROM: Full    Dental  (+) Dental Advisory Given, Teeth Intact   Pulmonary COPD,  COPD inhaler, former smoker,    Pulmonary exam normal        Cardiovascular hypertension, Pt. on medications and Pt. on home beta blockers + CAD and + Past MI  Normal cardiovascular exam   Hx coronary artery vasospasm    Neuro/Psych PSYCHIATRIC DISORDERS Anxiety Depression Bipolar Disorder TIACVA, No Residual Symptoms    GI/Hepatic Neg liver ROS, hiatal hernia, GERD  Controlled,  Endo/Other   Pre-DM   Renal/GU negative Renal ROS     Musculoskeletal  (+) Arthritis , Fibromyalgia - TMJ disorder    Abdominal   Peds  Hematology negative hematology ROS (+) anemia ,   Anesthesia Other Findings Covid test negative See PAT note   Reproductive/Obstetrics                           Anesthesia Physical Anesthesia Plan  ASA: III  Anesthesia Plan: General   Post-op Pain Management:    Induction: Intravenous  PONV Risk Score and Plan: 3 and Treatment may vary due to age or medical condition, Ondansetron, Dexamethasone and Scopolamine patch - Pre-op  Airway Management Planned: Oral ETT  Additional Equipment: None  Intra-op Plan:   Post-operative Plan: Extubation in OR  Informed Consent: I have reviewed the patients History and Physical, chart, labs and discussed the procedure including the risks, benefits and alternatives for the proposed anesthesia with the patient or authorized representative who has indicated his/her understanding and acceptance.     Dental advisory given  Plan  Discussed with: CRNA and Anesthesiologist  Anesthesia Plan Comments:       Anesthesia Quick Evaluation

## 2020-03-06 ENCOUNTER — Other Ambulatory Visit: Payer: Self-pay

## 2020-03-06 ENCOUNTER — Encounter (HOSPITAL_COMMUNITY): Payer: Self-pay | Admitting: Surgery

## 2020-03-06 ENCOUNTER — Ambulatory Visit (HOSPITAL_COMMUNITY)
Admission: RE | Admit: 2020-03-06 | Discharge: 2020-03-06 | Disposition: A | Discharge status: Home / Self Care | Payer: Medicare Other | Attending: Surgery | Admitting: Surgery

## 2020-03-06 ENCOUNTER — Ambulatory Visit (HOSPITAL_COMMUNITY): Payer: Medicare Other | Admitting: Anesthesiology

## 2020-03-06 ENCOUNTER — Ambulatory Visit (HOSPITAL_COMMUNITY): Payer: Medicare Other | Admitting: Vascular Surgery

## 2020-03-06 ENCOUNTER — Encounter (HOSPITAL_COMMUNITY)
Admission: RE | Disposition: A | Discharge status: Home / Self Care | Payer: Self-pay | Source: Home / Self Care | Attending: Surgery

## 2020-03-06 DIAGNOSIS — Z79899 Other long term (current) drug therapy: Secondary | ICD-10-CM | POA: Insufficient documentation

## 2020-03-06 DIAGNOSIS — K8010 Calculus of gallbladder with chronic cholecystitis without obstruction: Secondary | ICD-10-CM | POA: Insufficient documentation

## 2020-03-06 DIAGNOSIS — Z888 Allergy status to other drugs, medicaments and biological substances status: Secondary | ICD-10-CM | POA: Diagnosis not present

## 2020-03-06 DIAGNOSIS — Z7982 Long term (current) use of aspirin: Secondary | ICD-10-CM | POA: Diagnosis not present

## 2020-03-06 DIAGNOSIS — Z87891 Personal history of nicotine dependence: Secondary | ICD-10-CM | POA: Diagnosis not present

## 2020-03-06 DIAGNOSIS — Z881 Allergy status to other antibiotic agents status: Secondary | ICD-10-CM | POA: Diagnosis not present

## 2020-03-06 DIAGNOSIS — E871 Hypo-osmolality and hyponatremia: Secondary | ICD-10-CM | POA: Diagnosis not present

## 2020-03-06 DIAGNOSIS — Z885 Allergy status to narcotic agent status: Secondary | ICD-10-CM | POA: Insufficient documentation

## 2020-03-06 DIAGNOSIS — I214 Non-ST elevation (NSTEMI) myocardial infarction: Secondary | ICD-10-CM | POA: Diagnosis not present

## 2020-03-06 DIAGNOSIS — K8020 Calculus of gallbladder without cholecystitis without obstruction: Secondary | ICD-10-CM | POA: Diagnosis not present

## 2020-03-06 DIAGNOSIS — E785 Hyperlipidemia, unspecified: Secondary | ICD-10-CM | POA: Diagnosis not present

## 2020-03-06 HISTORY — PX: CHOLECYSTECTOMY: SHX55

## 2020-03-06 SURGERY — LAPAROSCOPIC CHOLECYSTECTOMY
Anesthesia: General | Site: Abdomen

## 2020-03-06 MED ORDER — FENTANYL CITRATE (PF) 250 MCG/5ML IJ SOLN
INTRAMUSCULAR | Status: DC | PRN
  Administered 2020-03-06 (×2): 50 ug via INTRAVENOUS

## 2020-03-06 MED ORDER — CHLORHEXIDINE GLUCONATE CLOTH 2 % EX PADS: 6.0000 | MEDICATED_PAD | Freq: Once | CUTANEOUS | Status: DC

## 2020-03-06 MED ORDER — IBUPROFEN 800 MG PO TABS: 800.0000 mg | ORAL_TABLET | Freq: Three times a day (TID) | ORAL | 0 refills | Status: AC

## 2020-03-06 MED ORDER — SODIUM CHLORIDE 0.9 % IR SOLN
Status: DC | PRN
  Administered 2020-03-06: 1

## 2020-03-06 MED ORDER — DEXAMETHASONE SODIUM PHOSPHATE 10 MG/ML IJ SOLN
INTRAMUSCULAR | Status: AC
  Filled 2020-03-06: qty 1

## 2020-03-06 MED ORDER — CELECOXIB 200 MG PO CAPS
400.0000 mg | ORAL_CAPSULE | ORAL | Status: AC
  Administered 2020-03-06: 400 mg via ORAL
  Filled 2020-03-06: qty 2

## 2020-03-06 MED ORDER — LIDOCAINE HCL (PF) 2 % IJ SOLN
INTRAMUSCULAR | Status: AC
  Filled 2020-03-06: qty 5

## 2020-03-06 MED ORDER — BUPIVACAINE HCL 0.25 % IJ SOLN
INTRAMUSCULAR | Status: DC | PRN
  Administered 2020-03-06: 30 mL

## 2020-03-06 MED ORDER — CHLORHEXIDINE GLUCONATE 0.12 % MT SOLN
15.0000 mL | Freq: Once | OROMUCOSAL | Status: AC
  Administered 2020-03-06: 15 mL via OROMUCOSAL
  Filled 2020-03-06: qty 15

## 2020-03-06 MED ORDER — 0.9 % SODIUM CHLORIDE (POUR BTL) OPTIME
TOPICAL | Status: DC | PRN
  Administered 2020-03-06: 1000 mL

## 2020-03-06 MED ORDER — FENTANYL CITRATE (PF) 250 MCG/5ML IJ SOLN
INTRAMUSCULAR | Status: AC
  Filled 2020-03-06: qty 5

## 2020-03-06 MED ORDER — CELECOXIB 200 MG PO CAPS
ORAL_CAPSULE | ORAL | Status: AC
  Filled 2020-03-06: qty 2

## 2020-03-06 MED ORDER — ORAL CARE MOUTH RINSE: 15.0000 mL | Freq: Once | OROMUCOSAL | Status: AC

## 2020-03-06 MED ORDER — MIDAZOLAM HCL 2 MG/2ML IJ SOLN
INTRAMUSCULAR | Status: DC | PRN
  Administered 2020-03-06: 2 mg via INTRAVENOUS

## 2020-03-06 MED ORDER — ROCURONIUM BROMIDE 10 MG/ML (PF) SYRINGE
PREFILLED_SYRINGE | INTRAVENOUS | Status: AC
  Filled 2020-03-06: qty 10

## 2020-03-06 MED ORDER — LIDOCAINE 2% (20 MG/ML) 5 ML SYRINGE
INTRAMUSCULAR | Status: DC | PRN
  Administered 2020-03-06: 60 mg via INTRAVENOUS

## 2020-03-06 MED ORDER — PROPOFOL 10 MG/ML IV BOLUS
INTRAVENOUS | Status: DC | PRN
  Administered 2020-03-06: 150 mg via INTRAVENOUS

## 2020-03-06 MED ORDER — ACETAMINOPHEN 500 MG PO TABS
1000.0000 mg | ORAL_TABLET | ORAL | Status: AC
  Administered 2020-03-06: 1000 mg via ORAL
  Filled 2020-03-06: qty 2

## 2020-03-06 MED ORDER — ACETAMINOPHEN 500 MG PO TABS
ORAL_TABLET | ORAL | Status: AC
  Filled 2020-03-06: qty 2

## 2020-03-06 MED ORDER — ENOXAPARIN SODIUM 40 MG/0.4ML ~~LOC~~ SOLN
40.0000 mg | Freq: Once | SUBCUTANEOUS | Status: AC
  Administered 2020-03-06: 40 mg via SUBCUTANEOUS
  Filled 2020-03-06: qty 0.4

## 2020-03-06 MED ORDER — ONDANSETRON HCL 4 MG/2ML IJ SOLN
INTRAMUSCULAR | Status: DC | PRN
  Administered 2020-03-06: 4 mg via INTRAVENOUS

## 2020-03-06 MED ORDER — ONDANSETRON HCL 4 MG/2ML IJ SOLN
INTRAMUSCULAR | Status: AC
  Filled 2020-03-06: qty 2

## 2020-03-06 MED ORDER — LACTATED RINGERS IV SOLN: INTRAVENOUS | Status: DC

## 2020-03-06 MED ORDER — GABAPENTIN 300 MG PO CAPS
300.0000 mg | ORAL_CAPSULE | ORAL | Status: AC
  Administered 2020-03-06: 300 mg via ORAL
  Filled 2020-03-06: qty 1

## 2020-03-06 MED ORDER — FENTANYL CITRATE (PF) 100 MCG/2ML IJ SOLN
INTRAMUSCULAR | Status: AC
  Filled 2020-03-06: qty 2

## 2020-03-06 MED ORDER — FENTANYL CITRATE (PF) 100 MCG/2ML IJ SOLN
25.0000 ug | INTRAMUSCULAR | Status: DC | PRN
  Administered 2020-03-06 (×3): 25 ug via INTRAVENOUS

## 2020-03-06 MED ORDER — CLINDAMYCIN PHOSPHATE 900 MG/50ML IV SOLN
900.0000 mg | INTRAVENOUS | Status: AC
  Administered 2020-03-06: 900 mg via INTRAVENOUS
  Filled 2020-03-06: qty 50

## 2020-03-06 MED ORDER — HYDROCODONE-ACETAMINOPHEN 5-325 MG PO TABS
1.0000 | ORAL_TABLET | ORAL | 0 refills | Status: DC | PRN
Start: 2020-03-06 — End: 2020-03-18

## 2020-03-06 MED ORDER — MIDAZOLAM HCL 2 MG/2ML IJ SOLN
INTRAMUSCULAR | Status: AC
  Filled 2020-03-06: qty 2

## 2020-03-06 MED ORDER — ROCURONIUM BROMIDE 10 MG/ML (PF) SYRINGE
PREFILLED_SYRINGE | INTRAVENOUS | Status: DC | PRN
  Administered 2020-03-06: 50 mg via INTRAVENOUS

## 2020-03-06 MED ORDER — BUPIVACAINE HCL (PF) 0.25 % IJ SOLN
INTRAMUSCULAR | Status: AC
  Filled 2020-03-06: qty 30

## 2020-03-06 MED ORDER — DEXAMETHASONE SODIUM PHOSPHATE 10 MG/ML IJ SOLN
INTRAMUSCULAR | Status: DC | PRN
  Administered 2020-03-06: 8 mg via INTRAVENOUS

## 2020-03-06 MED ORDER — SUGAMMADEX SODIUM 200 MG/2ML IV SOLN
INTRAVENOUS | Status: DC | PRN
  Administered 2020-03-06: 200 mg via INTRAVENOUS

## 2020-03-06 MED ORDER — ONDANSETRON HCL 4 MG/2ML IJ SOLN: 4.0000 mg | Freq: Once | INTRAMUSCULAR | Status: DC | PRN

## 2020-03-06 SURGICAL SUPPLY — 39 items
APPLIER CLIP 5 13 M/L LIGAMAX5 (MISCELLANEOUS) ×2
CANISTER SUCT 3000ML PPV (MISCELLANEOUS) ×2 IMPLANT
CHLORAPREP W/TINT 26 (MISCELLANEOUS) ×2 IMPLANT
CLIP APPLIE 5 13 M/L LIGAMAX5 (MISCELLANEOUS) ×1 IMPLANT
COVER SURGICAL LIGHT HANDLE (MISCELLANEOUS) ×2 IMPLANT
COVER WAND RF STERILE (DRAPES) ×2 IMPLANT
DERMABOND ADVANCED (GAUZE/BANDAGES/DRESSINGS) ×1
DERMABOND ADVANCED .7 DNX12 (GAUZE/BANDAGES/DRESSINGS) ×1 IMPLANT
ELECT REM PT RETURN 9FT ADLT (ELECTROSURGICAL) ×2
ELECTRODE REM PT RTRN 9FT ADLT (ELECTROSURGICAL) ×1 IMPLANT
GLOVE BIO SURGEON STRL SZ7.5 (GLOVE) ×2 IMPLANT
GLOVE BIOGEL PI IND STRL 8 (GLOVE) ×1 IMPLANT
GLOVE BIOGEL PI INDICATOR 8 (GLOVE) ×1
GOWN STRL REUS W/ TWL LRG LVL3 (GOWN DISPOSABLE) ×2 IMPLANT
GOWN STRL REUS W/ TWL XL LVL3 (GOWN DISPOSABLE) ×2 IMPLANT
GOWN STRL REUS W/TWL LRG LVL3 (GOWN DISPOSABLE) ×4
GOWN STRL REUS W/TWL XL LVL3 (GOWN DISPOSABLE) ×4
GRASPER SUT TROCAR 14GX15 (MISCELLANEOUS) IMPLANT
KIT BASIN OR (CUSTOM PROCEDURE TRAY) ×2 IMPLANT
KIT TURNOVER KIT B (KITS) ×2 IMPLANT
NEEDLE INSUFFLATION 14GA 120MM (NEEDLE) ×2 IMPLANT
NS IRRIG 1000ML POUR BTL (IV SOLUTION) ×2 IMPLANT
PAD ARMBOARD 7.5X6 YLW CONV (MISCELLANEOUS) ×2 IMPLANT
POUCH RETRIEVAL ECOSAC 10 (ENDOMECHANICALS) IMPLANT
POUCH RETRIEVAL ECOSAC 10MM (ENDOMECHANICALS)
POUCH SPECIMEN RETRIEVAL 10MM (ENDOMECHANICALS) ×2 IMPLANT
SCISSORS LAP 5X35 DISP (ENDOMECHANICALS) ×2 IMPLANT
SET IRRIG TUBING LAPAROSCOPIC (IRRIGATION / IRRIGATOR) ×2 IMPLANT
SET TUBE SMOKE EVAC HIGH FLOW (TUBING) ×2 IMPLANT
SLEEVE ENDOPATH XCEL 5M (ENDOMECHANICALS) ×4 IMPLANT
SPECIMEN JAR SMALL (MISCELLANEOUS) ×2 IMPLANT
SUT MNCRL AB 4-0 PS2 18 (SUTURE) ×2 IMPLANT
TOWEL GREEN STERILE (TOWEL DISPOSABLE) ×2 IMPLANT
TOWEL GREEN STERILE FF (TOWEL DISPOSABLE) ×2 IMPLANT
TRAY LAPAROSCOPIC MC (CUSTOM PROCEDURE TRAY) ×2 IMPLANT
TROCAR XCEL NON-BLD 11X100MML (ENDOMECHANICALS) ×2 IMPLANT
TROCAR XCEL NON-BLD 5MMX100MML (ENDOMECHANICALS) ×2 IMPLANT
WARMER LAPAROSCOPE (MISCELLANEOUS) ×2 IMPLANT
WATER STERILE IRR 1000ML POUR (IV SOLUTION) ×2 IMPLANT

## 2020-03-06 NOTE — Progress Notes (Signed)
Patient is in phase 2 and has met all criteria for discharge. She is waiting on ride arrival.

## 2020-03-06 NOTE — Transfer of Care (Signed)
Immediate Anesthesia Transfer of Care Note  Patient: Erin Lopez  Procedure(s) Performed: LAPAROSCOPIC CHOLECYSTECTOMY (N/A Abdomen)  Patient Location: PACU  Anesthesia Type:General  Level of Consciousness: drowsy and patient cooperative  Airway & Oxygen Therapy: Patient Spontanous Breathing and Patient connected to face mask oxygen  Post-op Assessment: Report given to RN and Post -op Vital signs reviewed and stable  Post vital signs: Reviewed and stable  Last Vitals:  Vitals Value Taken Time  BP 147/99 03/06/20 0957  Temp 36.5 C 03/06/20 0955  Pulse 66 03/06/20 0958  Resp 15 03/06/20 0958  SpO2 96 % 03/06/20 0958  Vitals shown include unvalidated device data.  Last Pain:  Vitals:   03/06/20 0811  TempSrc:   PainSc: 0-No pain         Complications: No complications documented.

## 2020-03-06 NOTE — Anesthesia Postprocedure Evaluation (Signed)
Anesthesia Post Note  Patient: Erin Lopez  Procedure(s) Performed: LAPAROSCOPIC CHOLECYSTECTOMY (N/A Abdomen)     Patient location during evaluation: PACU Anesthesia Type: General Level of consciousness: awake and alert Pain management: pain level controlled Vital Signs Assessment: post-procedure vital signs reviewed and stable Respiratory status: spontaneous breathing, nonlabored ventilation and respiratory function stable Cardiovascular status: blood pressure returned to baseline and stable Postop Assessment: no apparent nausea or vomiting Anesthetic complications: no   No complications documented.  Last Vitals:  Vitals:   03/06/20 1045 03/06/20 1100  BP: (!) 142/77 130/64  Pulse:  (!) 59  Resp:  13  Temp:    SpO2:  96%    Last Pain:  Vitals:   03/06/20 1100  TempSrc:   PainSc: [place]

## 2020-03-06 NOTE — H&P (Signed)
Admitting Physician: Nickola Major Delise Simenson  Service: General Surgery  CC: Abdominal pain  Subjective   HPI: Erin Lopez is an 69 y.o. female who is here for elective cholecystectomy.  Past Medical History:  Diagnosis Date  . Alcoholism in remission [facility])    states last alcohol 11 yrs ago  . Anemia   . Anxiety   . Asymptomatic gallstones    u/s 03/2019 (noted on imaging prior to that though)  . Bipolar 1 disorder ([place])    Admission for mania x 2 (most recent 11/2017)  . Cervical spondylosis without myelopathy 12/19/2013   MRI 02/2014: multilevel DDD/spondylosis, not much change compared to prior MRI.  Pt not in favor of invasive therapy for her neck as of 04/2014.  Marland Kitchen Complication of anesthesia    Patient woke up during a procedure in the past.  . COPD (chronic obstructive pulmonary disease) (HCC)    Moderate: anoro started 04/2017 by pulm, pt symptomatically improved and PFTs stable at f/u 06/2017.  Marland Kitchen Coronary artery spasm ([place]) 07/11/2018   nitrates=HA. Amlodipine=intol swelling.  Changed to coreg 09/03/18 by cardiologist.  . Depression   . Dilutional hyponatremia   . Endometritis 05/2017   possible; empiric tx with Flagyl by GYN.  . Environmental allergies   . Fibromyalgia   . GERD (gastroesophageal reflux disease)   . Hepatic steatosis 03/2019   u/s abd  . Herpes zoster 08/07/2017   L scapular region  . Hiatal hernia   . History of adenomatous polyp of colon 04/2008; 08/2014   No high grade dysplasia: recall 5 yrs ([name], [name])  . History of basal cell carcinoma excision    NOSE  . History of Helicobacter pylori infection 04/2008   +gastric biopsy (gastritis but no metaplasia, dysplasia, or malignancy identified)  . History of kidney stones   . History of TIA (transient ischemic attack)    secondary to HELLP syndrome 1994 post c/s--  residual memory loss  . Hyperlipidemia    statin started after her NSTEMI: goal  LDL <70.  Marland Kitchen Hypertension   . Internal  hemorrhoids   . Ischemic cardiomyopathy 05/2018   Echo EF 45-50% in context of NSTEMI from CA spasm. // Echo 8/21: 55-60, mild LVH, normal RVSF, RVSP 40, trivial MR  . 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 04/2017.  . Leg pain    ABIs 11/2019: normal   . Memory loss    Abnl MRI brain and CT brain c/w chronic microvascular ischemia.  Repeat MRI brain 02/2014 stable (Dr. Mayra Reel with no plan to f/u with neuro as of 04/2014)  . NSTEMI (non-ST elevated myocardial infarction) ([place]) 05/2018   Echo EF 45-50% in context of NSTEMI from CA spasm.// Myoview 8/21: EF 66, no ischemia, low risk  . Osteoporosis 05/2010   2012 'penia; 06/2015 'porosis:  prolia.  DEXA 09/2017 T-score -2.2 femur neck.  As of 11/2018 we're restarting prolia soon and will check next DEXA 1 yr from her next prolia injection.  . Pelvic floor dysfunction    [facility] ([name])  . Pneumonia   . Postmenopausal vaginal bleeding 05/2017   x 1 small episode; GYN attempted endo bx but unable to penetrate cervix due to severe cerv stenosis (due to postmenopausal state + hx of LEEP).  Endo u/s showed thin endo lining.  Per GYN---no  evidence of endomet pathology on w/u---obs as of 07/2017.  Marland Kitchen Prediabetes 06/2016   Fasting gluc 105; HbA1c at that time was 6.0%.  A1c 6.2% 12/2017.  Marland Kitchen Recurrent kidney stones 08/2013   Left ureteral calculus: Perc nephr--cystoscopy w/ureteroscopy + laser for stone removal.  Residual asymptomatic left renal nephrolithiasis <15mm present post-procedure.  Right sided hydronephrosis-persistent (on u/s)--urol ordered CT to further eval 08/2016: 1.6 cm nonobstructing stone lower pole left kidney, no hydro, plan for PCN extraction ([facility])  . Shortness of breath   . Sprain of neck 12/19/2013  . Stroke [facility])    TIA  . TMJ  (temporomandibular joint disorder)    USES  MOUTH GUARD  . Toe fracture, left 12/2017   2nd toe prox phalanx (immobilization, [name])  . Vitamin D deficiency 05/2011  . Weak urinary stream 07/2016   with elevated PVR and mild left hydronephrosis secondary to this ([facility] started flomax 0.4mg  qd for this).    Past Surgical History:  Procedure Laterality Date  . ABIs  11/29/2019   NORMAL  . ANTERIOR CERVICAL DECOMP/DISCECTOMY FUSION  06-30-2009   C3 -- C5  AND EXPLORATION OF FUSION C5-7 W/  PLATE REMOVAL  . BACK SURGERY    . BUNIONECTOMY Left   . CARDIOVASCULAR STRESS TEST  12/03/2019   myoc perf im: NORMAL  . CERVICAL FUSION  2002   anterior C5 -- C7  . CERVICAL SPINE SURGERY  08-27-1999     C6 -- T1  LAMINECTOMY/  DISKECTOMY  . CESAREAN SECTION  1994  . COLONOSCOPY W/ POLYPECTOMY  04/2008;08/2014   2016 tubular adenoma x 1; +diverticulosis and int/ext hemorrhoids.  Recall 5 yrs  . CYSTOSCOPY WITH URETEROSCOPY AND STENT PLACEMENT Left 08/28/2013   Procedure: CYSTOSCOPY, RGP,  WITH URETEROSCOPY AND STENT REMOVAL;  Surgeon: [name], MD;  [facility];  Service: Urology;  Laterality: Left;  . DEXA  09/2017   T-score -2.2 femur neck: improved compared to 06/2015.  Marland Kitchen ESOPHAGOGASTRODUODENOSCOPY  2010; 08/2014   +Candidal esophagitis; Mild chronic gastritis w/intestinal metaplasia--NEG H pylori, neg for eosinophilic esoph  . HOLMIUM LASER APPLICATION Left 08/20/8414   Procedure: HOLMIUM LASER APPLICATION;  Surgeon: [name], MD;  Location: [facility];  Service: Urology;  Laterality: Left;  . JOINT REPLACEMENT    . LEFT HEART CATH AND CORONARY ANGIOGRAPHY N/A 05/15/2018   Evidence for spasm in distal LAD.  Mod RCA atherosclerosis, o/w no CAD.  Procedure: LEFT HEART CATH AND CORONARY ANGIOGRAPHY;  Surgeon: Leonie Man, MD;  Location: [place] [facility];  Service: Cardiovascular;  Laterality: N/A;  . NEGATIVE SLEEP STUDY   04-01-2007  . NEPHROLITHOTOMY Left 08/05/2013   Procedure: NEPHROLITHOTOMY PERCUTANEOUS;  Surgeon: [name], MD;  Location: [facility];  Service: Urology;  Laterality: Left;  . POLYSOMNOGRAM  01/2019   NORMAL  . POLYSOMNOGRAPHY  10/25/2018   Dr. Dion Saucier due to pt inadequat sleep time (83 min).  . TONSILLECTOMY AND ADENOIDECTOMY  1975  . TOTAL KNEE ARTHROPLASTY Right 2003  . TRANSTHORACIC ECHOCARDIOGRAM  05/2018; 12/03/2019   (after MI secondary to arterial spasm): EF 45-50%; severe hypokinesis of apical anterolateral, inferolateral , anterior, and inferior LV myocardium. 11/2019 EF 55-60%, valves fine  . ULTRASOUND EXAM,PELVIC COMPLETE ([facility] HX)  06/25/15   NORMAL (done by GYN)    Family History  Problem Relation Age of Onset  . Alcohol abuse Mother   . Arthritis Mother   . Hypertension Mother   .  Hyperlipidemia Mother   . Diabetes Mother   . Parkinsonism Mother   . Breast cancer Mother   . Prostate cancer Father   . Hypertension Father   . Hyperlipidemia Father   . Diabetes Father   . Heart disease Father   . Esophageal cancer Sister   . Colon polyps Sister   . Colon polyps Brother   . Heart disease Brother        x 2  . Irritable bowel syndrome Sister     Social:  reports that she quit smoking about 26 years ago. Her smoking use included cigarettes. She has a 33.00 pack-year smoking history. She has never used smokeless tobacco. She reports that she does not drink alcohol and does not use drugs.  Allergies:  Allergies  Allergen Reactions  . Ceftin [Cefuroxime Axetil] Anaphylaxis    Swelling of eyes and tongue  . Ciprofloxacin Anaphylaxis    Difficulty breathing and generalized swelling  . Fosamax [Alendronate Sodium] Diarrhea  . Morphine And Related Other (See Comments)    Hallucinations/ disoriented - pt stated, "Only Morphine" - 03/23/17  . Oxycodone Other (See Comments)    Patient was "hooked" on medication.  . Prednisone Other (See Comments)     Hyperactivity  . Seroquel [Quetiapine] Other (See Comments)    oversedation    Medications: Current Outpatient Medications  Medication Instructions  . acetaminophen (TYLENOL) 1,000 mg, Oral, Every 6 hours PRN  . albuterol (VENTOLIN HFA) 108 (90 Base) MCG/ACT inhaler 2 puffs, Inhalation, Every 4 hours PRN  . ALPRAZolam (XANAX) 0.5 MG tablet TAKE 1 TABLET BY MOUTH 3 TIMES A DAY AS NEEDED FOR ANXIETY  . ANORO ELLIPTA 62.5-25 MCG/INH AEPB INHALE 1 PUFF BY MOUTH EVERY DAY  . aspirin 81 mg, Oral, Daily  . atorvastatin (LIPITOR) 80 mg, Oral, Daily-1800  . citalopram (CELEXA) 20 MG tablet TAKE 1 TABLET BY MOUTH TWICE A DAY  . cyclobenzaprine (FLEXERIL) 10 MG tablet TAKE 1 TABLET BY MOUTH TWICE A DAY  . doxycycline (VIBRAMYCIN) 50 mg, Oral, 2 times daily  . fluticasone (FLONASE) 50 MCG/ACT nasal spray SPRAY 2 SPRAYS INTO EACH NOSTRIL EVERY DAY  . hydrochlorothiazide (HYDRODIURIL) 12.5 mg, Oral, Daily  . hydrOXYzine (ATARAX/VISTARIL) 25 MG tablet 1-2 tabs po tid prn anxiety  . ipratropium-albuterol (DUONEB) 0.5-2.5 (3) MG/3ML SOLN 3 mLs, Nebulization, 3 times daily PRN  . lamoTRIgine (LAMICTAL) 300 mg, Oral, Daily at bedtime  . losartan (COZAAR) 25 mg, Oral, Daily  . Magnesium 250 mg, Oral, Daily  . metoprolol succinate (TOPROL XL) 100 mg, Oral, Daily, Take with or immediately following a meal.  . montelukast (SINGULAIR) 10 MG tablet TAKE 1 TABLET BY MOUTH EVERY DAY IN THE EVENING  . Multiple Vitamin (MULTI-VITAMINS) TABS 1 tablet, Oral, Daily  . nitroGLYCERIN (NITROSTAT) 0.4 mg, Sublingual, Every 5 min x3 PRN  . ondansetron (ZOFRAN) 8 mg, Oral, Every 8 hours PRN  . pantoprazole (PROTONIX) 40 mg, Oral, Daily  . polyethylene glycol powder (GLYCOLAX/MIRALAX) 17 g, Oral, Daily  . potassium citrate (UROCIT-K) 10 MEQ (1080 MG) SR tablet 10 mEq, Oral, 2 times daily  . Propylene Glycol (SYSTANE COMPLETE) 0.6 % SOLN 1 drop, Ophthalmic, 2 times daily  . tamsulosin (FLOMAX) 0.4 mg, Daily  .  tretinoin (RETIN-A) 2.263 % cream 1 application, Topical, Daily at bedtime  . triamcinolone cream (KENALOG) 0.1 % 1 application, Topical, Daily  . TURMERIC PO 1 tablet, 2 times daily  . zolpidem (AMBIEN) 10 MG tablet TAKE 1 TABLET BY MOUTH EVERYDAY  AT BEDTIME    ROS - all of the below systems have been reviewed with the patient and positives are indicated with bold text General: chills, fever or night sweats Eyes: blurry vision or double vision ENT: epistaxis or sore throat Allergy/Immunology: itchy/watery eyes or nasal congestion Hematologic/Lymphatic: bleeding problems, blood clots or swollen lymph nodes Endocrine: temperature intolerance or unexpected weight changes Breast: new or changing breast lumps or nipple discharge Resp: cough, shortness of breath, or wheezing CV: chest pain or dyspnea on exertion GI: as per HPI GU: dysuria, trouble voiding, or hematuria MSK: joint pain or joint stiffness Neuro: TIA or stroke symptoms Derm: pruritus and skin lesion changes Psych: anxiety and depression  Objective   PE There were no vitals taken for this visit. Constitutional: NAD; conversant; no deformities Eyes: Moist conjunctiva; no lid lag; anicteric; PERRL Neck: Trachea midline; no thyromegaly Lungs: Normal respiratory effort; no tactile fremitus CV: RRR; no palpable thrills; no pitting edema GI: Abd ; no palpable hepatosplenomegaly MSK: Normal range of motion of extremities; no clubbing/cyanosis Psychiatric: Appropriate affect; alert and oriented x3 Lymphatic: No palpable cervical or axillary lymphadenopathy  No results found for this or any previous visit (from the past 24 hour(s)).  Imaging Orders  No imaging studies ordered today     Assessment and Plan   CHASELYNN KEPPLE is an 69 y.o. female with cholelithiasis and biliary colic here for elective cholecystectomy.  Risks, benefits and alternatives reviewed.  Patient consented to proceed.  Louanna Raw,  M.D. General, Bariatric and Minimally Invasive Surgery  [facility] Use [email or web address] to contact on call provider

## 2020-03-06 NOTE — Discharge Instructions (Signed)
CHOLECYSTECTOMY POST OPERATIVE INSTRUCTIONS  Thinking Clearly   The anesthesia may cause you to feel different for 1 or 2 days. Do not drive, drink alcohol, or make any big decisions for at least 2 days.  Nutrition  When you wake up, you will be able to drink small amounts of liquid. If you do not feel sick, you can slowly advance your diet to regular foods.  Continue to drink lots of fluids, usually about 8 to 10 glasses per day.  Eat a high-fiber diet so you dont strain during bowel movements.  High-Fiber Foods o Foods high in fiber include beans, bran cereals and whole-grain breads, peas, dried fruit (figs, apricots, and dates), raspberries, blackberries, strawberries, sweet corn, broccoli, baked potatoes with skin, plums, pears, apples, greens, and nuts. Activity  Slowly increase your activity. Be sure to get up and walk every hour or so to prevent blood clots.  No heavy lifting or strenuous activity for 4 weeks following surgery to prevent hernias at your incision sites  It is normal to feel tired. You may need more sleep than usual.  Get your rest but make sure to get up and move around frequently to prevent blood clots and pneumonia.  Work and Return to [employer] can go back to work when you feel well enough. Discuss the timing with your surgeon.  You can usually go back to school or work 1 week after an operation.  If your work requires heavy lifting or strenuous activity you need to be placed on light duty for 4 weeks following surgery.  You can return to gym class, sports or other physical activities 4 weeks after surgery.  Wound Care  Always wash your hands before and after touching near your incision site.  Do not soak in a bathtub until cleared at your follow up appointment. You may take a shower 24 hours after surgery.  A small amount of drainage from the incision is normal. If the drainage is thick and yellow or the site is red, you may have an infection,  so call your surgeon.  If you have a drain in one of your incisions, it will be taken out in office when the drainage stops.  Steri-Strips will fall off in 7 to 10 days or they will be removed during your first office visit.  If you have dermabond glue covering over the incision, allow the glue to flake off on its own.  Avoid wearing tight or rough clothing. It may rub your incisions and make it harder for them to heal.  Protect the new skin, especially from the sun. The sun can burn and cause darker scarring.  Your scar will heal in about 4 to 6 weeks and will become softer and continue to fade over the next year.  The cosmetic appearance of the incisions will improve over the course of the first year after surgery.  Sensation around your incision will return in a few weeks or months.  Bowel Movements  After intestinal surgery, you may have loose watery stools for several days. If watery diarrhea lasts longer than 3 days, contact your surgeon.  Pain medication (narcotics) can cause constipation. Increase the fiber in your diet with high-fiber foods if you are constipated. You can take an over the counter stool softener like Colace to avoid constipation.  Additional over the counter medications can also be used if Colace isn't sufficient (for example, Milk of Magnesia or Miralax).  Pain  The amount of pain  is different for each person. Some people need only 1 to 3 doses of pain control medication, while others need more.  Take alternating doses of tylenol and ibuprofen around the clock for the first five days following surgery.  This will provide a baseline of pain control and help with inflammation.  Take the narcotic pain medication in addition if needed for severe pain.  Contact Your Surgeon at [phone], if you have:  Pain in your right upper abdomen like a gallbladder attack.  Pain that will not go away  Pain that gets worse  A fever of more than 101F (38.3C)  Repeated  vomiting  Swelling, redness, bleeding, or bad-smelling drainage from your wound site  Strong abdominal pain  No bowel movement or unable to pass gas for 3 days  Watery diarrhea lasting longer than 3 days  Pain Control  The goal of pain control is to minimize pain, keep you moving and help you heal. Your surgical team will work with you on your pain plan. Most often a combination of therapies and medications are used to control your pain. You may also be given medication (local anesthetic) at the surgical site. This may help control your pain for several days.  Extreme pain puts extra stress on your body at a time when your body needs to focus on healing. Do not wait until your pain has reached a level 10 or is unbearable before telling your doctor or nurse. It is much easier to control pain before it becomes severe.  Following a laparoscopic procedure, pain is sometimes felt in the shoulder. This is due to the gas inserted into your abdomen during the procedure. Moving and walking helps to decrease the gas and the right shoulder pain.   Use the guide below for ways to manage your post-operative pain. Learn more by going to facs.org/safepaincontrol.  How Intense Is My Pain Common Therapies to Feel Better       I hardly notice my pain, and it does not interfere with my activities.  I notice my pain and it distracts me, but I can still do activities (sitting up, walking, standing).  Non-Medication Therapies  Ice (in a bag, applied over clothing at the surgical site), elevation, rest, meditation, massage, distraction (music, TV, play) walking and mild exercise Splinting the abdomen with pillows +  Non-Opioid Medications Acetaminophen (Tylenol) Non-steroidal anti-inflammatory drugs (NSAIDS) Aspirin, Ibuprofen (Motrin, Advil) Naproxen (Aleve) Take these as needed, when you feel pain. Both acetaminophen and NSAIDs help to decrease pain and swelling (inflammation).      My  pain is hard to ignore and is more noticeable even when I rest.  My pain interferes with my usual activities.  Non-Medication Therapies  +  Non-Opioid medications  Take on a regular schedule (around-the-clock) instead of as needed. (For example, Tylenol every 6 hours at 9:00 am, 3:00 pm, 9:00 pm, 3:00 am and Motrin every 6 hours at 12:00 am, 6:00 am, 12:00 pm, 6:00 pm)         I am focused on my pain, and I am not doing my daily activities.  I am groaning in pain, and I cannot sleep. I am unable to do anything.  My pain is as bad as it could be, and nothing else matters.  Non-Medication Therapies  +  Around-the-Clock Non-Opioid Medications  +  Short-acting opioids  Opioids should be used with other medications to manage severe pain. Opioids block pain and give a feeling of euphoria (feel high). Addiction,  a serious side effect of opioids, is rare with short-term (a few days) use.  Examples of short-acting opioids include: Tramadol (Ultram), Hydrocodone (Norco, Vicodin), Hydromorphone (Dilaudid), Oxycodone (Oxycontin)     The above directions have been adapted from the [employer] Surgical Patient Education Program.  Please refer to the ACS website if needed: PureLie.ch.ashx.   Louanna Raw, MD [facility] [address], [place], [place], [place]  [address] ?  [address], [place], [place]   [address] [phone] ? [phone] ? FAX [phone] Web site: [email or web address]

## 2020-03-06 NOTE — Anesthesia Procedure Notes (Signed)
Procedure Name: Intubation Date/Time: 03/06/2020 9:15 AM Performed by: Kathryne Hitch, CRNA Pre-anesthesia Checklist: Patient identified, Emergency Drugs available, Suction available and Patient being monitored Patient Re-evaluated:Patient Re-evaluated prior to induction Oxygen Delivery Method: Circle system utilized Preoxygenation: Pre-oxygenation with 100% oxygen Induction Type: IV induction Ventilation: Mask ventilation without difficulty Laryngoscope Size: Miller and 2 Grade View: Grade I Tube type: Oral Tube size: 7.0 mm Number of attempts: 1 Airway Equipment and Method: Stylet and Oral airway Placement Confirmation: ETT inserted through vocal cords under direct vision,  positive ETCO2 and breath sounds checked- equal and bilateral Secured at: 21 cm Tube secured with: Tape Dental Injury: Teeth and Oropharynx as per pre-operative assessment

## 2020-03-06 NOTE — Op Note (Signed)
Patient: Erin Lopez (Aug 17, 1950, [id number])  Date of Surgery: 03/06/2020   Preoperative Diagnosis: calculous cholecystitis   Postoperative Diagnosis: calculous cholecystitis   Surgical Procedure: LAPAROSCOPIC CHOLECYSTECTOMY:    Operative Team Members:  Surgeon(s) and Role:    * Erin Lopez, Nickola Major, MD - Primary    Erin Boston, MD - Assisting   Anesthesiologist: Erin Pili, MD CRNA: Erin Hitch, CRNA   Anesthesia: General   Fluids:  Total I/O In: 550 [I.V.:500; IV Piggyback:50] Out: 20 [OZDGU:44]  Complications: * No complications entered in OR log *  Drains:  none   Specimen:  ID Type Source Tests Collected by Time Destination  1 : gallbladder Tissue PATH Gallbladder SURGICAL PATHOLOGY Erin Lopez, Nickola Major, MD 03/06/2020 [phone]      Disposition:  PACU - hemodynamically stable.  Plan of Care: Discharge to home after PACU    Indications for Procedure: Erin Lopez is a 69 y.o. female who presented with abdominal pain.  History, physical and imaging was concerning for biliary colic.  Laparoscopic cholecystectomy was recommended for the patient.  The procedure itself, as well as the risks, benefits and alternatives were discussed with the patient.  Risks discussed included but were not limited to the risk of infection, bleeding, damage to nearby structures, need to convert to open procedure, incisional hernia, bile leak, common bile duct injury and the need for additional procedures or surgeries.  With this discussion complete and all questions answered the patient granted consent to proceed.  Findings: Gallstones  Description of Procedure:   On the date stated above, the patient was taken to the operating room suite and placed in supine positioning.  Sequential compression devices were placed on the lower extremities to prevent blood clots.  General endotracheal anesthesia was induced. Preoperative antibiotics (clindamycin) were given within  30 minutes of incision.  The patient's abdomen was prepped and draped in the usual sterile fashion.  A time-out was completed verifying the correct patient, procedure, positioning and equipment needed for the case.  We began by anesthetizing the skin with local anesthetic and then making a 5 mm incision just below the umbilicus.  We dissected through the subcutaneous tissues to the fascia.  The fascia was grasped and elevated using a Kocher clamp.  A Veress needle was inserted into the abdomen and the abdomen was insufflated to 15 mmHg.  A 5 mm trocar was inserted in this position under optical guidance and then the abdomen was inspected.  There was no trauma to the underlying viscera with initial trocar placement.  Any abnormal findings, other than inflammation in the right upper quadrant, are listed above in the findings section.  Three additional trocars were placed, one 12 mm trocar in the subxiphoid position, one 5 mm trocar in the midline epigastric area and one 48mm trocar in the right upper quadrant subcostally.  These were placed under direct vision without any trauma to the underlying viscera.    The patient was then placed in head up, left side down positioning.  The gallbladder was identified and dissected free from its attachments to the omentum allowing the duodenum to fall away.  The infundibulum of the gallbladder was dissected free working laterally to medially.  The cystic duct and cystic artery were dissected free from surrounding connective tissue.  The infundibulum of the gallbladder was dissected off the cystic plate.  A critical view of safety was obtained with the cystic duct and cystic artery being cleared of connective tissues and  clearly the only two structures entering into the gallbladder with the liver clearly visible behind.  Clips were then applied to the cystic duct and cystic artery and then these structures were divided.  The gallbladder was dissected off the cystic plate,  placed in an endocatch bag and removed from the 12 mm subxiphoid port site.  The clips were inspected and appeared effective.  The cystic plate was inspected and hemostasis was obtained using electrocautery.  A suction irrigator was used to clean the operative field.  Attention was turned to closure.  The 12 mm subxiphoid port site was closed using a 0-vicryl suture on a fascial suture passer.  The abdomen was desufflated.  The skin was closed using 4-0 monocryl and dermabond.  All sponge and needle counts were correct at the conclusion of the case.    Erin Raw, MD General, Bariatric, & Minimally Invasive Surgery [facility], [place]

## 2020-03-07 ENCOUNTER — Encounter (HOSPITAL_COMMUNITY): Payer: Self-pay | Admitting: Surgery

## 2020-03-09 ENCOUNTER — Encounter: Payer: Self-pay | Admitting: Family Medicine

## 2020-03-09 LAB — SURGICAL PATHOLOGY

## 2020-03-11 ENCOUNTER — Other Ambulatory Visit: Payer: Self-pay

## 2020-03-11 ENCOUNTER — Ambulatory Visit (INDEPENDENT_AMBULATORY_CARE_PROVIDER_SITE_OTHER): Payer: Medicare Other

## 2020-03-11 VITALS — BP 146/74 | HR 67 | Temp 98.4°F | Resp 16 | Ht 66.0 in | Wt 183.8 lb

## 2020-03-11 DIAGNOSIS — Z0000 Encounter for general adult medical examination without abnormal findings: Secondary | ICD-10-CM
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
## 2020-03-11 NOTE — Patient Instructions (Signed)
Erin Lopez , Thank you for taking time to come for your Medicare Wellness Visit. I appreciate your ongoing commitment to your health goals. Please review the following plan we discussed and let me know if I can assist you in the future.   Screening recommendations/referrals: Colonoscopy: Due- Per our conversaion, Appt with GI is on 03/18/20 Mammogram: Completed 10/29/2019-Due 10/28/2020 Bone Density: Completed 10/29/2019- Due 10/28/2021 Recommended yearly ophthalmology/optometry visit for glaucoma screening and checkup Recommended yearly dental visit for hygiene and checkup  Vaccinations: Influenza vaccine: Up to date Pneumococcal vaccine: Discuss Pneumovax-23 with [name]. Tdap vaccine: Up to date-Due-08/06/2024 Shingles vaccine: Completed vaccines   Covid-19:Completed vaccines  Advanced directives: Copy in chart  Conditions/risks identified: See problem list  Next appointment: Follow up in one year for your annual wellness visit    Preventive Care 65 Years and Older, Female Preventive care refers to lifestyle choices and visits with your health care provider that can promote health and wellness. What does preventive care include?  A yearly physical exam. This is also called an annual well check.  Dental exams once or twice a year.  Routine eye exams. Ask your health care provider how often you should have your eyes checked.  Personal lifestyle choices, including:  Daily care of your teeth and gums.  Regular physical activity.  Eating a healthy diet.  Avoiding tobacco and drug use.  Limiting alcohol use.  Practicing safe sex.  Taking low-dose aspirin every day.  Taking vitamin and mineral supplements as recommended by your health care provider. What happens during an annual well check? The services and screenings done by your health care provider during your annual well check will depend on your age, overall health, lifestyle risk factors, and family history of  disease. Counseling  Your health care provider may ask you questions about your:  Alcohol use.  Tobacco use.  Drug use.  Emotional well-being.  Home and relationship well-being.  Sexual activity.  Eating habits.  History of falls.  Memory and ability to understand (cognition).  Work and work Statistician.  Reproductive health. Screening  You may have the following tests or measurements:  Height, weight, and BMI.  Blood pressure.  Lipid and cholesterol levels. These may be checked every 5 years, or more frequently if you are over 60 years old.  Skin check.  Lung cancer screening. You may have this screening every year starting at age 62 if you have a 30-pack-year history of smoking and currently smoke or have quit within the past 15 years.  Fecal occult blood test (FOBT) of the stool. You may have this test every year starting at age 67.  Flexible sigmoidoscopy or colonoscopy. You may have a sigmoidoscopy every 5 years or a colonoscopy every 10 years starting at age 4.  Hepatitis C blood test.  Hepatitis B blood test.  Sexually transmitted disease (STD) testing.  Diabetes screening. This is done by checking your blood sugar (glucose) after you have not eaten for a while (fasting). You may have this done every 1-3 years.  Bone density scan. This is done to screen for osteoporosis. You may have this done starting at age 29.  Mammogram. This may be done every 1-2 years. Talk to your health care provider about how often you should have regular mammograms. Talk with your health care provider about your test results, treatment options, and if necessary, the need for more tests. Vaccines  Your health care provider may recommend certain vaccines, such as:  Influenza vaccine. This  is recommended every year.  Tetanus, diphtheria, and acellular pertussis (Tdap, Td) vaccine. You may need a Td booster every 10 years.  Zoster vaccine. You may need this after age  83.  Pneumococcal 13-valent conjugate (PCV13) vaccine. One dose is recommended after age 49.  Pneumococcal polysaccharide (PPSV23) vaccine. One dose is recommended after age 21. Talk to your health care provider about which screenings and vaccines you need and how often you need them. This information is not intended to replace advice given to you by your health care provider. Make sure you discuss any questions you have with your health care provider. Document Released: 04/17/2015 Document Revised: 12/09/2015 Document Reviewed: 01/20/2015 Elsevier Interactive Patient Education  2017 [place] Prevention in the Home Falls can cause injuries. They can happen to people of all ages. There are many things you can do to make your home safe and to help prevent falls. What can I do on the outside of my home?  Regularly fix the edges of walkways and driveways and fix any cracks.  Remove anything that might make you trip as you walk through a door, such as a raised step or threshold.  Trim any bushes or trees on the path to your home.  Use bright outdoor lighting.  Clear any walking paths of anything that might make someone trip, such as rocks or tools.  Regularly check to see if handrails are loose or broken. Make sure that both sides of any steps have handrails.  Any raised decks and porches should have guardrails on the edges.  Have any leaves, snow, or ice cleared regularly.  Use sand or salt on walking paths during winter.  Clean up any spills in your garage right away. This includes oil or grease spills. What can I do in the bathroom?  Use night lights.  Install grab bars by the toilet and in the tub and shower. Do not use towel bars as grab bars.  Use non-skid mats or decals in the tub or shower.  If you need to sit down in the shower, use a plastic, non-slip stool.  Keep the floor dry. Clean up any water that spills on the floor as soon as it happens.  Remove  soap buildup in the tub or shower regularly.  Attach bath mats securely with double-sided non-slip rug tape.  Do not have throw rugs and other things on the floor that can make you trip. What can I do in the bedroom?  Use night lights.  Make sure that you have a light by your bed that is easy to reach.  Do not use any sheets or blankets that are too big for your bed. They should not hang down onto the floor.  Have a firm chair that has side arms. You can use this for support while you get dressed.  Do not have throw rugs and other things on the floor that can make you trip. What can I do in the kitchen?  Clean up any spills right away.  Avoid walking on wet floors.  Keep items that you use a lot in easy-to-reach places.  If you need to reach something above you, use a strong step stool that has a grab bar.  Keep electrical cords out of the way.  Do not use floor polish or wax that makes floors slippery. If you must use wax, use non-skid floor wax.  Do not have throw rugs and other things on the floor that can make you  trip. What can I do with my stairs?  Do not leave any items on the stairs.  Make sure that there are handrails on both sides of the stairs and use them. Fix handrails that are broken or loose. Make sure that handrails are as long as the stairways.  Check any carpeting to make sure that it is firmly attached to the stairs. Fix any carpet that is loose or worn.  Avoid having throw rugs at the top or bottom of the stairs. If you do have throw rugs, attach them to the floor with carpet tape.  Make sure that you have a light switch at the top of the stairs and the bottom of the stairs. If you do not have them, ask someone to add them for you. What else can I do to help prevent falls?  Wear shoes that:  Do not have high heels.  Have rubber bottoms.  Are comfortable and fit you well.  Are closed at the toe. Do not wear sandals.  If you use a  stepladder:  Make sure that it is fully opened. Do not climb a closed stepladder.  Make sure that both sides of the stepladder are locked into place.  Ask someone to hold it for you, if possible.  Clearly mark and make sure that you can see:  Any grab bars or handrails.  First and last steps.  Where the edge of each step is.  Use tools that help you move around (mobility aids) if they are needed. These include:  Canes.  Walkers.  Scooters.  Crutches.  Turn on the lights when you go into a dark area. Replace any light bulbs as soon as they burn out.  Set up your furniture so you have a clear path. Avoid moving your furniture around.  If any of your floors are uneven, fix them.  If there are any pets around you, be aware of where they are.  Review your medicines with your doctor. Some medicines can make you feel dizzy. This can increase your chance of falling. Ask your doctor what other things that you can do to help prevent falls. This information is not intended to replace advice given to you by your health care provider. Make sure you discuss any questions you have with your health care provider. Document Released: 01/15/2009 Document Revised: 08/27/2015 Document Reviewed: 04/25/2014 Elsevier Interactive Patient Education  2017 [employer].

## 2020-03-12 ENCOUNTER — Other Ambulatory Visit: Payer: Self-pay | Admitting: Family Medicine

## 2020-03-13 ENCOUNTER — Telehealth: Payer: Self-pay | Admitting: Family Medicine

## 2020-03-13 ENCOUNTER — Other Ambulatory Visit: Payer: Self-pay | Admitting: Family Medicine

## 2020-03-13 NOTE — Telephone Encounter (Signed)
Claretta Fraise 03/13/2020 Called pt regarding community resource referral received. My info is [phone] please see ref notes for more details. Thank you.  [place], Care Management

## 2020-03-16 NOTE — Telephone Encounter (Signed)
Alpraz eRx'd

## 2020-03-16 NOTE — Telephone Encounter (Signed)
Last written: 01/07/20 Last ov: 01/31/20  Next ov: 03/30/20 Contract: UDS: 09/19/19

## 2020-03-17 ENCOUNTER — Encounter: Payer: Self-pay | Admitting: *Deleted

## 2020-03-18 ENCOUNTER — Encounter: Payer: Self-pay | Admitting: Internal Medicine

## 2020-03-18 ENCOUNTER — Ambulatory Visit (INDEPENDENT_AMBULATORY_CARE_PROVIDER_SITE_OTHER): Payer: Medicare Other | Admitting: Internal Medicine

## 2020-03-18 VITALS — BP 124/70 | HR 63 | Ht 67.0 in | Wt 183.0 lb

## 2020-03-18 DIAGNOSIS — R1013 Epigastric pain: Secondary | ICD-10-CM

## 2020-03-18 DIAGNOSIS — R1319 Other dysphagia: Secondary | ICD-10-CM

## 2020-03-18 DIAGNOSIS — K219 Gastro-esophageal reflux disease without esophagitis: Secondary | ICD-10-CM | POA: Diagnosis not present

## 2020-03-18 DIAGNOSIS — Z86010 Personal history of colonic polyps: Secondary | ICD-10-CM

## 2020-03-18 MED ORDER — PANTOPRAZOLE SODIUM 40 MG PO TBEC: 40.0000 mg | DELAYED_RELEASE_TABLET | Freq: Every day | ORAL | 2 refills | Status: DC

## 2020-03-18 NOTE — Progress Notes (Signed)
Patient ID: Erin Lopez, female   DOB: January 28, 1951, 69 y.o.   MRN: [id number] HPI: Honi Name is a 69 year old female with a history of gastritis with intestinal metaplasia, adenomatous colon polyps, GERD, family history of esophagus cancer in her sister, previous alcohol abuse in remission, history of depression, history of endometriosis, gallstones with very recent cholecystectomy who is seen for burning epigastric pain and issues with swallowing.  She is here alone today.  She is known to me from upper endoscopy and colonoscopy performed in 20116. EGD revealed Candida esophagitis, gastritis.  Gastric biopsy showed metaplasia without dysplasia or H. pylori.  3-year interval EGD was recommended Colonoscopy revealed a descending colon adenoma, moderate diverticulosis in the left colon  She reports that she was having upper abdominal and right upper quadrant abdominal pain.  She was found to have symptomatic cholelithiasis and underwent laparoscopic cholecystectomy by Dr. Thermon Leyland and Dr. Johney Maine on 03/06/2020.  She is recovering well from this.  She reports that she continues to have a burning epigastric type pain.  She is also noticed food and pills sticking with swallowing.  She is having some indigestion symptom which is better if she does not eat.  She has a prescription for pantoprazole but is not taking this.  She was worried about possible renal failure associated with this medication.  Her bowel movements are small and "rabbit like" if she does not use MiraLAX.  She uses MiraLAX every other day and with this is having a normal bowel movement daily.  She does have some lower abdominal pain which she attributes to her diagnosis of endometriosis.  Bowel movement can alleviate some of her lower abdominal discomfort.  No blood in stool or melena.  Past Medical History:  Diagnosis Date  . Alcoholism in remission [facility])    states last alcohol 11 yrs ago  . Anemia   . Anxiety   . Bipolar 1  disorder ([place])    Admission for mania x 2 (most recent 11/2017)  . Cervical spondylosis without myelopathy 12/19/2013   MRI 02/2014: multilevel DDD/spondylosis, not much change compared to prior MRI.  Pt not in favor of invasive therapy for her neck as of 04/2014.  Marland Kitchen Cholelithiasis   . COPD (chronic obstructive pulmonary disease) (HCC)    Moderate: anoro started 04/2017 by pulm, pt symptomatically improved and PFTs stable at f/u 06/2017.  Marland Kitchen Coronary artery spasm ([place]) 07/11/2018   nitrates=HA. Amlodipine=intol swelling.  Changed to coreg 09/03/18 by cardiologist.  . Depression   . Dilutional hyponatremia   . Endometritis 05/2017   possible; empiric tx with Flagyl by GYN.  . Environmental allergies   . Fibromyalgia   . GERD (gastroesophageal reflux disease)   . Hepatic steatosis 03/2019   u/s abd  . Herpes zoster 08/07/2017   L scapular region  . Hiatal hernia   . History of adenomatous polyp of colon 04/2008; 08/2014   No high grade dysplasia: recall 5 yrs ([name], [name])  . History of basal cell carcinoma excision    NOSE  . History of Helicobacter pylori infection 04/2008   +gastric biopsy (gastritis but no metaplasia, dysplasia, or malignancy identified)  . History of kidney stones   . History of TIA (transient ischemic attack)    secondary to HELLP syndrome 1994 post c/s--  residual memory loss  . Hyperlipidemia    statin started after her NSTEMI: goal  LDL <70.  Marland Kitchen Hypertension   . Internal hemorrhoids   . Ischemic cardiomyopathy 05/2018  Echo EF 45-50% in context of NSTEMI from CA spasm. // Echo 8/21: 55-60, mild LVH, normal RVSF, RVSP 40, trivial MR  . 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 04/2017.  . Leg pain    ABIs 11/2019: normal   . Memory loss    Abnl MRI brain and CT brain c/w chronic  microvascular ischemia.  Repeat MRI brain 02/2014 stable (Dr. Mayra Reel with no plan to f/u with neuro as of 04/2014)  . NSTEMI (non-ST elevated myocardial infarction) ([place]) 05/2018   Echo EF 45-50% in context of NSTEMI from CA spasm.// Myoview 8/21: EF 66, no ischemia, low risk  . Osteoporosis 05/2010   2012 'penia; 06/2015 'porosis:  prolia.  DEXA 09/2017 T-score -2.2 femur neck.  As of 11/2018 we're restarting prolia soon and will check next DEXA 1 yr from her next prolia injection.  . Pelvic floor dysfunction    [facility] ([name])  . Pneumonia   . Postmenopausal vaginal bleeding 05/2017   x 1 small episode; GYN attempted endo bx but unable to penetrate cervix due to severe cerv stenosis (due to postmenopausal state + hx of LEEP).  Endo u/s showed thin endo lining.  Per GYN---no evidence of endomet pathology on w/u---obs as of 07/2017.  Marland Kitchen Prediabetes 06/2016   Fasting gluc 105; HbA1c at that time was 6.0%.  A1c 6.2% 12/2017.  Marland Kitchen Recurrent kidney stones 08/2013   Left ureteral calculus: Perc nephr--cystoscopy w/ureteroscopy + laser for stone removal.  Residual asymptomatic left renal nephrolithiasis <12mm present post-procedure.  Right sided hydronephrosis-persistent (on u/s)--urol ordered CT to further eval 08/2016: 1.6 cm nonobstructing stone lower pole left kidney, no hydro, plan for PCN extraction ([facility])  . Shortness of breath   . Sprain of neck 12/19/2013  . Stroke [facility])    TIA  . TMJ (temporomandibular joint disorder)    USES  MOUTH GUARD  . Toe fracture, left 12/2017   2nd toe prox phalanx (immobilization, [name])  . Tubular adenoma of colon   . Vitamin D deficiency 05/2011  . Weak urinary stream 07/2016   with elevated PVR and mild left hydronephrosis secondary to this ([facility] started flomax 0.4mg  qd for this).    Past Surgical History:  Procedure Laterality Date  . ABIs  11/29/2019   NORMAL  . ANTERIOR CERVICAL DECOMP/DISCECTOMY FUSION  06-30-2009   C3 --  C5  AND EXPLORATION OF FUSION C5-7 W/  PLATE REMOVAL  . BACK SURGERY    . BUNIONECTOMY Left   . CARDIOVASCULAR STRESS TEST  12/03/2019   myoc perf im: NORMAL  . CERVICAL FUSION  2002   anterior C5 -- C7  . CERVICAL SPINE SURGERY  08-27-1999     C6 -- T1  LAMINECTOMY/  DISKECTOMY  . CESAREAN SECTION  1994  . CHOLECYSTECTOMY N/A 03/06/2020   Procedure: LAPAROSCOPIC CHOLECYSTECTOMY;  Surgeon: Felicie Morn, MD;  Location: [place];  Service: General;  Laterality: N/A;  . COLONOSCOPY W/ POLYPECTOMY  04/2008;08/2014   2016 tubular adenoma x 1; +diverticulosis and int/ext hemorrhoids.  Recall 5 yrs  . CYSTOSCOPY WITH URETEROSCOPY AND STENT PLACEMENT Left 08/28/2013   Procedure: CYSTOSCOPY, RGP,  WITH URETEROSCOPY AND STENT REMOVAL;  Surgeon: [name], MD;  Location[facility];  Service: Urology;  Laterality: Left;  . DEXA  09/2017   T-score -2.2 femur  neck: improved compared to 06/2015.  Marland Kitchen ESOPHAGOGASTRODUODENOSCOPY  2010; 08/2014   +Candidal esophagitis; Mild chronic gastritis w/intestinal metaplasia--NEG H pylori, neg for eosinophilic esoph  . HOLMIUM LASER APPLICATION Left 11/26/2351   Procedure: HOLMIUM LASER APPLICATION;  Surgeon: [name], MD;  Location: [facility];  Service: Urology;  Laterality: Left;  . JOINT REPLACEMENT    . LEFT HEART CATH AND CORONARY ANGIOGRAPHY N/A 05/15/2018   Evidence for spasm in distal LAD.  Mod RCA atherosclerosis, o/w no CAD.  Procedure: LEFT HEART CATH AND CORONARY ANGIOGRAPHY;  Surgeon: Leonie Man, MD;  Location: [facility];  Service: Cardiovascular;  Laterality: N/A;  . NEGATIVE SLEEP STUDY  04-01-2007  . NEPHROLITHOTOMY Left 08/05/2013   Procedure: NEPHROLITHOTOMY PERCUTANEOUS;  Surgeon: [name], MD;  Location: [facility];  Service: Urology;  Laterality: Left;  . POLYSOMNOGRAM  01/2019   NORMAL  . POLYSOMNOGRAPHY  10/25/2018   Dr. Dion Saucier due to pt inadequat sleep time (83  min).  . TONSILLECTOMY AND ADENOIDECTOMY  1975  . TOTAL KNEE ARTHROPLASTY Right 2003  . TRANSTHORACIC ECHOCARDIOGRAM  05/2018; 12/03/2019   (after MI secondary to arterial spasm): EF 45-50%; severe hypokinesis of apical anterolateral, inferolateral , anterior, and inferior LV myocardium. 11/2019 EF 55-60%, valves fine  . ULTRASOUND EXAM,PELVIC COMPLETE ([facility] H[facility])  06/25/15   NORMAL (done by GYN)    Outpatient Medications Prior to Visit  Medication Sig Dispense Refill  . acetaminophen (TYLENOL) 500 MG tablet Take 1,000 mg by mouth every 6 (six) hours as needed for mild pain or headache.     . albuterol (VENTOLIN HFA) 108 (90 Base) MCG/ACT inhaler Inhale 2 puffs into the lungs every 4 (four) hours as needed for wheezing or shortness of breath. 18 g 1  . ALPRAZolam (XANAX) 0.5 MG tablet TAKE 1 TABLET BY MOUTH 3 TIMES A DAY AS NEEDED FOR ANXIETY 90 tablet 5  . ANORO ELLIPTA 62.5-25 MCG/INH AEPB INHALE 1 PUFF BY MOUTH EVERY DAY (Patient taking differently: Inhale 1 puff into the lungs daily.) 60 each 11  . aspirin EC 81 MG EC tablet Take 1 tablet (81 mg total) by mouth daily. 90 tablet 3  . atorvastatin (LIPITOR) 80 MG tablet Take 1 tablet (80 mg total) by mouth daily at 6 PM. 90 tablet 3  . citalopram (CELEXA) 20 MG tablet TAKE 1 TABLET BY MOUTH TWICE A DAY (Patient taking differently: Take 20 mg by mouth in the morning and at bedtime.) 180 tablet 0  . cyclobenzaprine (FLEXERIL) 10 MG tablet TAKE 1 TABLET BY MOUTH TWICE A DAY (Patient taking differently: Take 10 mg by mouth in the morning and at bedtime.) 60 tablet 6  . doxycycline (VIBRAMYCIN) 50 MG capsule Take 50 mg by mouth 2 (two) times daily.    . fluticasone (FLONASE) 50 MCG/ACT nasal spray SPRAY 2 SPRAYS INTO EACH NOSTRIL EVERY DAY (Patient taking differently: Place 2 sprays into both nostrils daily.) 48 mL 2  . hydrochlorothiazide (HYDRODIURIL) 12.5 MG tablet Take 1 tablet (12.5 mg total) by mouth daily. 90 tablet 1  . ipratropium-albuterol  (DUONEB) 0.5-2.5 (3) MG/3ML SOLN Take 3 mLs by nebulization 3 (three) times daily as needed. 360 mL 3  . lamoTRIgine (LAMICTAL) 100 MG tablet TAKE 3 TABLETS (300 MG TOTAL) BY MOUTH AT BEDTIME. 270 tablet 0  . losartan (COZAAR) 25 MG tablet Take 1 tablet (25 mg total) by mouth daily. 90 tablet 1  . Magnesium 250 MG TABS Take 250 mg  by mouth daily.     . metoprolol succinate (TOPROL XL) 100 MG 24 hr tablet Take 1 tablet (100 mg total) by mouth daily. Take with or immediately following a meal. 90 tablet 3  . montelukast (SINGULAIR) 10 MG tablet TAKE 1 TABLET BY MOUTH EVERY DAY IN THE EVENING (Patient taking differently: Take 10 mg by mouth every evening.) 90 tablet 3  . Multiple Vitamin (MULTI-VITAMINS) TABS Take 1 tablet by mouth daily.     . nitroGLYCERIN (NITROSTAT) 0.4 MG SL tablet Place 1 tablet (0.4 mg total) under the tongue every 5 (five) minutes x 3 doses as needed for chest pain. 25 tablet 3  . ondansetron (ZOFRAN) 8 MG tablet Take 1 tablet (8 mg total) by mouth every 8 (eight) hours as needed for nausea or vomiting. (Patient taking differently: Take 8 mg by mouth in the morning and at bedtime.) 60 tablet 1  . polyethylene glycol powder (GLYCOLAX/MIRALAX) powder TAKE 17 G BY MOUTH DAILY. 3162 g 0  . potassium citrate (UROCIT-K) 10 MEQ (1080 MG) SR tablet Take 10 mEq by mouth 2 (two) times a day.     Marland Kitchen Propylene Glycol 0.6 % SOLN Apply 1 drop to eye 2 (two) times daily.    Marland Kitchen tretinoin (RETIN-A) 0.025 % cream Apply 1 application topically at bedtime.     . triamcinolone cream (KENALOG) 0.1 % Apply 1 application topically daily.    Marland Kitchen zolpidem (AMBIEN) 10 MG tablet TAKE 1 TABLET BY MOUTH EVERYDAY AT BEDTIME (Patient taking differently: Take 10 mg by mouth at bedtime.) 90 tablet 1  . pantoprazole (PROTONIX) 40 MG tablet TAKE 1 TABLET BY MOUTH EVERY DAY 60 tablet 0  . HYDROcodone-acetaminophen (NORCO/VICODIN) 5-325 MG tablet Take 1 tablet by mouth every 4 (four) hours as needed for severe pain.  (Patient not taking: Reported on 03/18/2020) 10 tablet 0   No facility-administered medications prior to visit.    Allergies  Allergen Reactions  . Ceftin [Cefuroxime Axetil] Anaphylaxis    Swelling of eyes and tongue  . Ciprofloxacin Anaphylaxis    Difficulty breathing and generalized swelling  . Fosamax [Alendronate Sodium] Diarrhea  . Morphine And Related Other (See Comments)    Hallucinations/ disoriented - pt stated, "Only Morphine" - 03/23/17  . Oxycodone Other (See Comments)    Patient was "hooked" on medication.  . Prednisone Other (See Comments)    Hyperactivity  . Seroquel [Quetiapine] Other (See Comments)    oversedation    Family History  Problem Relation Age of Onset  . Alcohol abuse Mother   . Arthritis Mother   . Hypertension Mother   . Hyperlipidemia Mother   . Diabetes Mother   . Parkinsonism Mother   . Breast cancer Mother   . Prostate cancer Father   . Hypertension Father   . Hyperlipidemia Father   . Diabetes Father   . Heart disease Father   . Esophageal cancer Sister   . Colon polyps Sister   . Colon polyps Brother   . Heart disease Brother        x 2  . Irritable bowel syndrome Sister     Social History   Tobacco Use  . Smoking status: Former Smoker    Packs/day: 1.00    Years: 33.00    Pack years: 33.00    Types: Cigarettes    Quit date: 08/22/1993    Years since quitting: 26.5  . Smokeless tobacco: Never Used  Vaping Use  . Vaping Use: Never used  Substance Use Topics  . Alcohol use: No    Alcohol/week: 0.0 standard drinks    Comment: ALCOHOLIC IN REMISSION SINCE  2004  . Drug use: No    ROS: As per history of present illness, otherwise negative  BP 124/70   Pulse 63   Ht 5\' 7"  (1.702 m)   Wt 183 lb (83 kg)   SpO2 95%   BMI 28.66 kg/m  Gen: awake, alert, NAD HEENT: anicteric CV: RRR Pulm: CTA b/l Abd: soft, NT/ND, +BS throughout Ext: no c/c/e Neuro: nonfocal   RELEVANT LABS AND IMAGING: CBC    Component  Value Date/Time   WBC 6.1 03/02/2020 1534   RBC 3.60 (L) 03/02/2020 1534   HGB 10.9 (L) 03/02/2020 1534   HCT 33.7 (L) 03/02/2020 1534   PLT 233 03/02/2020 1534   MCV 93.6 03/02/2020 1534   MCH 30.3 03/02/2020 1534   MCHC 32.3 03/02/2020 1534   RDW 12.9 03/02/2020 1534   LYMPHSABS 1.7 01/21/2020 1106   MONOABS 0.6 01/21/2020 1106   EOSABS 0.2 01/21/2020 1106   BASOSABS 0.0 01/21/2020 1106    CMP     Component Value Date/Time   NA 137 03/02/2020 1534   NA 135 12/03/2019 1103   K 3.9 03/02/2020 1534   CL 100 03/02/2020 1534   CO2 27 03/02/2020 1534   GLUCOSE 95 03/02/2020 1534   BUN 11 03/02/2020 1534   BUN 8 12/03/2019 1103   CREATININE 1.10 (H) 03/02/2020 1534   CREATININE 1.27 (H) 05/16/2016 1525   CALCIUM 9.2 03/02/2020 1534   PROT 6.3 (L) 03/02/2020 1534   PROT 6.4 09/25/2018 1007   ALBUMIN 4.3 03/02/2020 1534   ALBUMIN 4.8 09/25/2018 1007   AST 27 03/02/2020 1534   ALT 27 03/02/2020 1534   ALKPHOS 56 03/02/2020 1534   BILITOT 1.0 03/02/2020 1534   BILITOT 0.7 09/25/2018 1007   GFRNONAA 55 (L) 03/02/2020 1534   GFRAA 80 12/03/2019 1103    ASSESSMENT/PLAN:  69 year old female with a history of gastritis with intestinal metaplasia, adenomatous colon polyps, GERD, family history of esophagus cancer in her sister, previous alcohol abuse in remission, history of depression, history of endometriosis, gallstones with very recent cholecystectomy who is seen for burning epigastric pain and issues with swallowing.   1.  GERD/esophageal dysphagia/family history of colon cancer/epigastric abdominal pain --she does have a history of reflux and is not currently on PPI therapy.  I recommended that she resume pantoprazole 40 mg daily.  We discussed the risk, benefits and alternatives regarding PPI therapy. --Upper endoscopy in the [place] --Resume pantoprazole 40 mg daily --She can continue to use as needed ondansetron for nausea/vomiting --I also think it is a bit too early to be  fully recover from cholecystectomy which we discussed today.  Her procedure will be delayed about 4 weeks to allow complete healing after surgery  2.  Chronic constipation --continue MiraLAX therapy daily  3.  History of adenomatous colon polyps --surveillance colonoscopy recommended.  We discussed the risk, benefits and alternatives and she is agreeable wishes to proceed    SM:OLMBEML, [place], [address] [place],  [place] [id number]

## 2020-03-18 NOTE — Patient Instructions (Signed)
You have been scheduled for an endoscopy and colonoscopy. Please follow the written instructions given to you at your visit today. Please pick up your prep supplies at the pharmacy within the next 1-3 days. If you use inhalers (even only as needed), please bring them with you on the day of your procedure.  We have sent the following medications to your pharmacy for you to pick up at your convenience: Pantoprazole 40 mg daily  Continue zofran as needed.  Continue miralax as needed.  If you are age 64 or older, your body mass index should be between 23-30. Your Body mass index is 28.66 kg/m. If this is out of the aforementioned range listed, please consider follow up with your Primary Care Provider.  If you are age 86 or younger, your body mass index should be between 19-25. Your Body mass index is 28.66 kg/m. If this is out of the aformentioned range listed, please consider follow up with your Primary Care Provider.   Due to recent changes in healthcare laws, you may see the results of your imaging and laboratory studies on MyChart before your provider has had a chance to review them.  We understand that in some cases there may be results that are confusing or concerning to you. Not all laboratory results come back in the same time frame and the provider may be waiting for multiple results in order to interpret others.  Please give [place] 48 hours in order for your provider to thoroughly review all the results before contacting the office for clarification of your results.

## 2020-03-30 ENCOUNTER — Other Ambulatory Visit: Payer: Self-pay

## 2020-03-30 ENCOUNTER — Ambulatory Visit (INDEPENDENT_AMBULATORY_CARE_PROVIDER_SITE_OTHER): Payer: Medicare Other | Admitting: Family Medicine

## 2020-03-30 ENCOUNTER — Encounter: Payer: Self-pay | Admitting: Family Medicine

## 2020-03-30 VITALS — BP 123/78 | HR 83 | Temp 97.7°F | Resp 16 | Ht 67.0 in | Wt 187.2 lb

## 2020-03-30 DIAGNOSIS — I10 Essential (primary) hypertension: Secondary | ICD-10-CM

## 2020-03-30 DIAGNOSIS — R7303 Prediabetes: Secondary | ICD-10-CM

## 2020-03-30 DIAGNOSIS — R1013 Epigastric pain: Secondary | ICD-10-CM | POA: Diagnosis not present

## 2020-03-30 DIAGNOSIS — R140 Abdominal distension (gaseous): Secondary | ICD-10-CM

## 2020-03-30 DIAGNOSIS — F3181 Bipolar II disorder: Secondary | ICD-10-CM

## 2020-03-30 DIAGNOSIS — D649 Anemia, unspecified: Secondary | ICD-10-CM | POA: Diagnosis not present

## 2020-03-30 DIAGNOSIS — F411 Generalized anxiety disorder: Secondary | ICD-10-CM

## 2020-03-30 NOTE — Progress Notes (Signed)
OFFICE VISIT  03/30/2020  CC:  Chief Complaint  Patient presents with  . Follow-up    RCI, pt is not fasting   HPI:    Patient is a 69 y.o. Caucasian female who presents for f/u recent abdominal pain and nausea + f/u HTN, prediabetes, bipolar d/o, anxiety, and insomnia. Had mixed biliary and gastritis symptomatology, ended up getting cholecystectomy 03/06/20. On 03/18/20 she was Dr. Hilarie Fredrickson with [facility] and was encouraged to start the pantoprazole and plan is for her to get an EGD and colonoscopy soon.  Doing fine overall, just feels like abd feels "bloated".  "Maybe I'm just getting too fat". Denies pain or gas or nausea.  No problems when she eats.  She is now taking pantoprazole as rx'd. No constipation or diarrhea.  No melena or hematochezia.  HTN: no persistent bp elevations at home.  Regarding diet, she does not restrict calories or types of foods/nutritents at all.  Mood/anxiety: stable, compliant with meds.  No panic.  No prolonged down/hopeless feelings. As long as she takes [place] 10mg  qhs she sleeps fine.   PMP AWARE reviewed today: most recent rx for ambien 10mg  was filled 03/19/20, # 75, rx by me.  Most recent alpraz 0.5mg  rx filled 03/16/20, #90, rx by me. No red flags.   Past Medical History:  Diagnosis Date  . Alcoholism in remission [facility])    states last alcohol 11 yrs ago  . Anemia   . Anxiety   . Bipolar 1 disorder ([place])    Admission for mania x 2 (most recent 11/2017)  . Cervical spondylosis without myelopathy 12/19/2013   MRI 02/2014: multilevel DDD/spondylosis, not much change compared to prior MRI.  Pt not in favor of invasive therapy for her neck as of 04/2014.  Marland Kitchen Cholelithiasis   . COPD (chronic obstructive pulmonary disease) (HCC)    Moderate: anoro started 04/2017 by pulm, pt symptomatically improved and PFTs stable at f/u 06/2017.  Marland Kitchen Coronary artery spasm ([place]) 07/11/2018   nitrates=HA. Amlodipine=intol swelling.  Changed to coreg 09/03/18 by  cardiologist.  . Depression   . Dilutional hyponatremia   . Endometritis 05/2017   possible; empiric tx with Flagyl by GYN.  . Environmental allergies   . Fibromyalgia   . GERD (gastroesophageal reflux disease)   . Hepatic steatosis 03/2019   u/s abd  . Herpes zoster 08/07/2017   L scapular region  . Hiatal hernia   . History of adenomatous polyp of colon 04/2008; 08/2014   No high grade dysplasia: recall 5 yrs ([name], [name])  . History of basal cell carcinoma excision    NOSE  . History of Helicobacter pylori infection 04/2008   +gastric biopsy (gastritis but no metaplasia, dysplasia, or malignancy identified)  . History of kidney stones   . History of TIA (transient ischemic attack)    secondary to HELLP syndrome 1994 post c/s--  residual memory loss  . Hyperlipidemia    statin started after her NSTEMI: goal  LDL <70.  Marland Kitchen Hypertension   . Internal hemorrhoids   . Ischemic cardiomyopathy 05/2018   Echo EF 45-50% in context of NSTEMI from CA spasm. // Echo 8/21: 55-60, mild LVH, normal RVSF, RVSP 40, trivial MR  . 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 04/2017.  . Leg pain    ABIs 11/2019: normal   .  Memory loss    Abnl MRI brain and CT brain c/w chronic microvascular ischemia.  Repeat MRI brain 02/2014 stable (Dr. Mayra Reel with no plan to f/u with neuro as of 04/2014)  . NSTEMI (non-ST elevated myocardial infarction) ([place]) 05/2018   Echo EF 45-50% in context of NSTEMI from CA spasm.// Myoview 8/21: EF 66, no ischemia, low risk  . Osteoporosis 05/2010   2012 'penia; 06/2015 'porosis:  prolia.  DEXA 09/2017 T-score -2.2 femur neck.  As of 11/2018 we're restarting prolia soon and will check next DEXA 1 yr from her next prolia injection.  . Pelvic floor dysfunction    [facility] (Dr.  Louis Meckel)  . Pneumonia   . Postmenopausal vaginal bleeding 05/2017   x 1 small episode; GYN attempted endo bx but unable to penetrate cervix due to severe cerv stenosis (due to postmenopausal state + hx of LEEP).  Endo u/s showed thin endo lining.  Per GYN---no evidence of endomet pathology on w/u---obs as of 07/2017.  Marland Kitchen Prediabetes 06/2016   Fasting gluc 105; HbA1c at that time was 6.0%.  A1c 6.2% 12/2017.  Marland Kitchen Recurrent kidney stones 08/2013   Left ureteral calculus: Perc nephr--cystoscopy w/ureteroscopy + laser for stone removal.  Residual asymptomatic left renal nephrolithiasis <65mm present post-procedure.  Right sided hydronephrosis-persistent (on u/s)--urol ordered CT to further eval 08/2016: 1.6 cm nonobstructing stone lower pole left kidney, no hydro, plan for PCN extraction ([facility])  . Shortness of breath   . Sprain of neck 12/19/2013  . Stroke [facility])    TIA  . TMJ (temporomandibular joint disorder)    USES  MOUTH GUARD  . Toe fracture, left 12/2017   2nd toe prox phalanx (immobilization, [name])  . Tubular adenoma of colon   . Vitamin D deficiency 05/2011  . Weak urinary stream 07/2016   with elevated PVR and mild left hydronephrosis secondary to this ([facility] started flomax 0.4mg  qd for this).    Past Surgical History:  Procedure Laterality Date  . ABIs  11/29/2019   NORMAL  . ANTERIOR CERVICAL DECOMP/DISCECTOMY FUSION  06-30-2009   C3 -- C5  AND EXPLORATION OF FUSION C5-7 W/  PLATE REMOVAL  . BACK SURGERY    . BUNIONECTOMY Left   . CARDIOVASCULAR STRESS TEST  12/03/2019   myoc perf im: NORMAL  . CERVICAL FUSION  2002   anterior C5 -- C7  . CERVICAL SPINE SURGERY  08-27-1999     C6 -- T1  LAMINECTOMY/  DISKECTOMY  . CESAREAN SECTION  1994  . CHOLECYSTECTOMY N/A 03/06/2020   Procedure: LAPAROSCOPIC CHOLECYSTECTOMY;  Surgeon: Felicie Morn, MD;  Location: [place];  Service: General;  Laterality: N/A;  . COLONOSCOPY W/ POLYPECTOMY  04/2008;08/2014   2016 tubular  adenoma x 1; +diverticulosis and int/ext hemorrhoids.  Recall 5 yrs  . CYSTOSCOPY WITH URETEROSCOPY AND STENT PLACEMENT Left 08/28/2013   Procedure: CYSTOSCOPY, RGP,  WITH URETEROSCOPY AND STENT REMOVAL;  Surgeon: [name], MD;  Location: [facility];  Service: Urology;  Laterality: Left;  . DEXA  09/2017   T-score -2.2 femur neck: improved compared to 06/2015.  Marland Kitchen ESOPHAGOGASTRODUODENOSCOPY  2010; 08/2014   +Candidal esophagitis; Mild chronic gastritis w/intestinal metaplasia--NEG H pylori, neg for eosinophilic esoph  . HOLMIUM LASER APPLICATION Left [id number]   Procedure: HOLMIUM LASER APPLICATION;  Surgeon: [name], MD;  Location: [facility];  Service: Urology;  Laterality: Left;  . JOINT REPLACEMENT    . LEFT HEART CATH AND  CORONARY ANGIOGRAPHY N/A 05/15/2018   Evidence for spasm in distal LAD.  Mod RCA atherosclerosis, o/w no CAD.  Procedure: LEFT HEART CATH AND CORONARY ANGIOGRAPHY;  Surgeon: Leonie Man, MD;  Location: [place] [facility];  Service: Cardiovascular;  Laterality: N/A;  . NEGATIVE SLEEP STUDY  04-01-2007  . NEPHROLITHOTOMY Left 08/05/2013   Procedure: NEPHROLITHOTOMY PERCUTANEOUS;  Surgeon: [name], MD;  Location: [facility];  Service: Urology;  Laterality: Left;  . POLYSOMNOGRAM  01/2019   NORMAL  . POLYSOMNOGRAPHY  10/25/2018   Dr. Dion Saucier due to pt inadequat sleep time (83 min).  . TONSILLECTOMY AND ADENOIDECTOMY  1975  . TOTAL KNEE ARTHROPLASTY Right 2003  . TRANSTHORACIC ECHOCARDIOGRAM  05/2018; 12/03/2019   (after MI secondary to arterial spasm): EF 45-50%; severe hypokinesis of apical anterolateral, inferolateral , anterior, and inferior LV myocardium. 11/2019 EF 55-60%, valves fine  . ULTRASOUND EXAM,PELVIC COMPLETE ([facility] HX)  06/25/15   NORMAL (done by GYN)    Outpatient Medications Prior to Visit  Medication Sig Dispense Refill  . acetaminophen (TYLENOL) 500 MG tablet Take 1,000 mg by mouth every  6 (six) hours as needed for mild pain or headache.     . albuterol (VENTOLIN HFA) 108 (90 Base) MCG/ACT inhaler Inhale 2 puffs into the lungs every 4 (four) hours as needed for wheezing or shortness of breath. 18 g 1  . ALPRAZolam (XANAX) 0.5 MG tablet TAKE 1 TABLET BY MOUTH 3 TIMES A DAY AS NEEDED FOR ANXIETY 90 tablet 5  . ANORO ELLIPTA 62.5-25 MCG/INH AEPB INHALE 1 PUFF BY MOUTH EVERY DAY (Patient taking differently: Inhale 1 puff into the lungs daily.) 60 each 11  . aspirin EC 81 MG EC tablet Take 1 tablet (81 mg total) by mouth daily. 90 tablet 3  . atorvastatin (LIPITOR) 80 MG tablet Take 1 tablet (80 mg total) by mouth daily at 6 PM. 90 tablet 3  . citalopram (CELEXA) 20 MG tablet TAKE 1 TABLET BY MOUTH TWICE A DAY (Patient taking differently: Take 20 mg by mouth in the morning and at bedtime.) 180 tablet 0  . cyclobenzaprine (FLEXERIL) 10 MG tablet TAKE 1 TABLET BY MOUTH TWICE A DAY (Patient taking differently: Take 10 mg by mouth in the morning and at bedtime.) 60 tablet 6  . doxycycline (VIBRAMYCIN) 50 MG capsule Take 50 mg by mouth 2 (two) times daily.    . fluticasone (FLONASE) 50 MCG/ACT nasal spray SPRAY 2 SPRAYS INTO EACH NOSTRIL EVERY DAY (Patient taking differently: Place 2 sprays into both nostrils daily.) 48 mL 2  . hydrochlorothiazide (HYDRODIURIL) 12.5 MG tablet Take 1 tablet (12.5 mg total) by mouth daily. 90 tablet 1  . ipratropium-albuterol (DUONEB) 0.5-2.5 (3) MG/3ML SOLN Take 3 mLs by nebulization 3 (three) times daily as needed. 360 mL 3  . lamoTRIgine (LAMICTAL) 100 MG tablet TAKE 3 TABLETS (300 MG TOTAL) BY MOUTH AT BEDTIME. 270 tablet 0  . losartan (COZAAR) 25 MG tablet Take 1 tablet (25 mg total) by mouth daily. 90 tablet 1  . Magnesium 250 MG TABS Take 250 mg by mouth daily.     . metoprolol succinate (TOPROL XL) 100 MG 24 hr tablet Take 1 tablet (100 mg total) by mouth daily. Take with or immediately following a meal. 90 tablet 3  . montelukast (SINGULAIR) 10 MG  tablet TAKE 1 TABLET BY MOUTH EVERY DAY IN THE EVENING (Patient taking differently: Take 10 mg by mouth every evening.) 90 tablet 3  . Multiple  Vitamin (MULTI-VITAMINS) TABS Take 1 tablet by mouth daily.     . polyethylene glycol powder (GLYCOLAX/MIRALAX) powder TAKE 17 G BY MOUTH DAILY. 3162 g 0  . potassium citrate (UROCIT-K) 10 MEQ (1080 MG) SR tablet Take 10 mEq by mouth 2 (two) times a day.     Marland Kitchen Propylene Glycol 0.6 % SOLN Apply 1 drop to eye 2 (two) times daily.    Marland Kitchen tretinoin (RETIN-A) 0.025 % cream Apply 1 application topically at bedtime.     . triamcinolone cream (KENALOG) 0.1 % Apply 1 application topically daily.    Marland Kitchen zolpidem (AMBIEN) 10 MG tablet TAKE 1 TABLET BY MOUTH EVERYDAY AT BEDTIME (Patient taking differently: Take 10 mg by mouth at bedtime.) 90 tablet 1  . nitroGLYCERIN (NITROSTAT) 0.4 MG SL tablet Place 1 tablet (0.4 mg total) under the tongue every 5 (five) minutes x 3 doses as needed for chest pain. (Patient not taking: Reported on 03/30/2020) 25 tablet 3  . pantoprazole (PROTONIX) 40 MG tablet Take 1 tablet (40 mg total) by mouth daily. (Patient not taking: Reported on 03/30/2020) 60 tablet 2  . ondansetron (ZOFRAN) 8 MG tablet Take 1 tablet (8 mg total) by mouth every 8 (eight) hours as needed for nausea or vomiting. (Patient not taking: Reported on 03/30/2020) 60 tablet 1   No facility-administered medications prior to visit.    Allergies  Allergen Reactions  . Ceftin [Cefuroxime Axetil] Anaphylaxis    Swelling of eyes and tongue  . Ciprofloxacin Anaphylaxis    Difficulty breathing and generalized swelling  . Fosamax [Alendronate Sodium] Diarrhea  . Morphine And Related Other (See Comments)    Hallucinations/ disoriented - pt stated, "Only Morphine" - 03/23/17  . Oxycodone Other (See Comments)    Patient was "hooked" on medication.  . Prednisone Other (See Comments)    Hyperactivity  . Seroquel [Quetiapine] Other (See Comments)    oversedation    ROS As  per HPI  PE: Vitals with BMI 03/30/2020 03/18/2020 03/11/2020  Height 5\' 7"  5\' 7"  5\' 6"   Weight 187 lbs 3 oz 183 lbs 183 lbs 13 oz  BMI 29.31 [id number]  Systolic [id number]  Diastolic 78 70 74  Pulse 83 63 67     Gen: Alert, well appearing.  Patient is oriented to person, place, time, and situation. AFFECT: pleasant, lucid thought and speech. CV: RRR, no m/r/g.   LUNGS: CTA bilat, nonlabored resps, good aeration in all lung fields. ABD: soft, ND/NT EXT: no clubbing or cyanosis.  no edema.    LABS:  Lab Results  Component Value Date   HGBA1C 5.6 08/13/2019     Chemistry      Component Value Date/Time   NA 137 03/02/2020 1534   NA 135 12/03/2019 1103   K 3.9 03/02/2020 1534   CL 100 03/02/2020 1534   CO2 27 03/02/2020 1534   BUN 11 03/02/2020 1534   BUN 8 12/03/2019 1103   CREATININE 1.10 (H) 03/02/2020 1534   CREATININE 1.27 (H) 05/16/2016 1525      Component Value Date/Time   CALCIUM 9.2 03/02/2020 1534   ALKPHOS 56 03/02/2020 1534   AST 27 03/02/2020 1534   ALT 27 03/02/2020 1534   BILITOT 1.0 03/02/2020 1534   BILITOT 0.7 09/25/2018 1007     Lab Results  Component Value Date   WBC 6.1 03/02/2020   HGB 10.9 (L) 03/02/2020   HCT 33.7 (L) 03/02/2020   MCV 93.6 03/02/2020   PLT 233  03/02/2020   Lab Results  Component Value Date   IRON 84 04/29/2014   FERRITIN 27.6 04/29/2014   Lab Results  Component Value Date   VITAMINB12 688 12/11/2017   Lab Results  Component Value Date   CHOL 168 05/29/2019   HDL 57.50 05/29/2019   LDLCALC 88 05/29/2019   LDLDIRECT 144.1 05/29/2013   TRIG 111.0 05/29/2019   CHOLHDL 3 05/29/2019   IMPRESSION AND PLAN:  1) Postprandial n/v/abd pain: resolved s/p cholecystectomy a few weeks ago. Mild postop hb drop, suspect mostly dilutional. CBC today.  2) Dyspepsia/gastritis: MUCH improved now that she has been taking pantoprazole daily for the last 10d or so. Cont pantoprazole 40mg  qd. Plan for EGD per Dr.  Hilarie Fredrickson.  3) HTN: well controlled. Cont losartan 25mg  qd, hctz 12.5mg  qd, toprol xl 100 qd. Lytes/cr today.  4) Prediabetes: needs to work on diet and exercise. Nonfasting gluc and Hba1c today.  5) GAD: stable on citalopram 20mg  bid and xanax 0.5mg  tid. CSC updated today. No new rx's needed today.  6) Bipolar II: in remission. Cont lamotrigine 300 mg qd and citalopram 20mg  bid.  7) Hx of adenomatous colon polyps (08/2014): surveillance colonoscopy to be done soon at the time of EGD by Dr. Hilarie Fredrickson.  An After Visit Summary was printed and given to the patient.  FOLLOW UP: No follow-ups on file.  Signed:  Crissie Sickles, MD           03/30/2020

## 2020-03-31 ENCOUNTER — Telehealth: Payer: Self-pay | Admitting: Family Medicine

## 2020-03-31 NOTE — Telephone Encounter (Signed)
Erin Lopez 03/31/2020 Called pt regarding community resource referral received. My info is [phone] please see ref notes for more details. Thank you.  Erin Lopez Care Guide, Embedded Care Coordination [place], Care Management  

## 2020-04-01 LAB — HEMOGLOBIN A1C: Hgb A1c MFr Bld: 5.7 % (ref 4.6–6.5)

## 2020-04-01 LAB — CBC
HCT: 32.9 % — ABNORMAL LOW (ref 36.0–46.0)
Hemoglobin: 11.4 g/dL — ABNORMAL LOW (ref 12.0–15.0)
MCHC: 34.7 g/dL (ref 30.0–36.0)
MCV: 89.7 fl (ref 78.0–100.0)
Platelets: 236 10*3/uL (ref 150.0–400.0)
RBC: 3.67 Mil/uL — ABNORMAL LOW (ref 3.87–5.11)
RDW: 13.6 % (ref 11.5–15.5)
WBC: 5.8 10*3/uL (ref 4.0–10.5)

## 2020-04-01 LAB — BASIC METABOLIC PANEL
BUN: 15 mg/dL (ref 6–23)
CO2: 29 mEq/L (ref 19–32)
Calcium: 9.1 mg/dL (ref 8.4–10.5)
Chloride: 102 mEq/L (ref 96–112)
Creatinine, Ser: 0.9 mg/dL (ref 0.40–1.20)
GFR: 66.27 mL/min (ref >60.00)
Glucose, Bld: 95 mg/dL (ref 70–99)
Potassium: 3.8 mEq/L (ref 3.5–5.1)
Sodium: 140 mEq/L (ref 135–145)

## 2020-04-03 ENCOUNTER — Other Ambulatory Visit: Payer: Self-pay | Admitting: Physician Assistant

## 2020-04-19 ENCOUNTER — Other Ambulatory Visit: Payer: Self-pay | Admitting: Physician Assistant

## 2020-04-22 ENCOUNTER — Other Ambulatory Visit: Payer: Self-pay | Admitting: Family Medicine

## 2020-05-06 ENCOUNTER — Other Ambulatory Visit: Payer: Self-pay | Admitting: Family Medicine

## 2020-05-20 ENCOUNTER — Other Ambulatory Visit: Payer: Self-pay | Admitting: Family Medicine

## 2020-05-20 ENCOUNTER — Other Ambulatory Visit: Payer: Self-pay | Admitting: Pulmonary Disease

## 2020-05-26 ENCOUNTER — Encounter: Payer: Medicare Other | Admitting: Internal Medicine

## 2020-06-14 ENCOUNTER — Other Ambulatory Visit: Payer: Self-pay | Admitting: Family Medicine

## 2020-06-18 ENCOUNTER — Other Ambulatory Visit: Payer: Self-pay | Admitting: Family Medicine

## 2020-06-19 NOTE — Telephone Encounter (Signed)
Requesting: zolpidem Contract: 12/727/21 UDS: 09/19/19 Last Visit: 03/30/20 Next Visit: 10/07/20 Last Refill: 12/17/19(90,1)  Please Advise. Medication pending

## 2020-06-22 DIAGNOSIS — L820 Inflamed seborrheic keratosis: Secondary | ICD-10-CM | POA: Diagnosis not present

## 2020-06-22 DIAGNOSIS — L718 Other rosacea: Secondary | ICD-10-CM | POA: Diagnosis not present

## 2020-06-29 ENCOUNTER — Telehealth: Payer: Self-pay | Admitting: Internal Medicine

## 2020-06-29 NOTE — Telephone Encounter (Signed)
Patient is advised that we have left a sample of Plenvu at the front desk for her to pick up. She verbalizes understanding.

## 2020-06-29 NOTE — Telephone Encounter (Signed)
Inbound call from patient stating Plenvu is on back order and is requesting alternate prep be sent to pharmacy in chart.  Please advise.

## 2020-06-29 NOTE — Telephone Encounter (Signed)
Patient called again to followup on Plenvu. Her procedure is this Wednesday 3/30 @ 3:00pm. Please call her

## 2020-07-01 ENCOUNTER — Ambulatory Visit (AMBULATORY_SURGERY_CENTER): Payer: Medicare Other | Admitting: Internal Medicine

## 2020-07-01 ENCOUNTER — Other Ambulatory Visit: Payer: Self-pay

## 2020-07-01 ENCOUNTER — Encounter: Payer: Self-pay | Admitting: Internal Medicine

## 2020-07-01 VITALS — BP 146/65 | HR 80 | Temp 97.3°F | Resp 13 | Ht 67.0 in | Wt 183.0 lb

## 2020-07-01 DIAGNOSIS — R1319 Other dysphagia: Secondary | ICD-10-CM | POA: Diagnosis not present

## 2020-07-01 DIAGNOSIS — J449 Chronic obstructive pulmonary disease, unspecified: Secondary | ICD-10-CM | POA: Diagnosis not present

## 2020-07-01 DIAGNOSIS — I2510 Atherosclerotic heart disease of native coronary artery without angina pectoris: Secondary | ICD-10-CM | POA: Diagnosis not present

## 2020-07-01 DIAGNOSIS — K219 Gastro-esophageal reflux disease without esophagitis: Secondary | ICD-10-CM

## 2020-07-01 DIAGNOSIS — I10 Essential (primary) hypertension: Secondary | ICD-10-CM | POA: Diagnosis not present

## 2020-07-01 DIAGNOSIS — Z860100 Personal history of colon polyps, unspecified: Secondary | ICD-10-CM

## 2020-07-01 DIAGNOSIS — D124 Benign neoplasm of descending colon: Secondary | ICD-10-CM | POA: Diagnosis not present

## 2020-07-01 MED ORDER — SODIUM CHLORIDE 0.9 % IV SOLN: 500.0000 mL | Freq: Once | INTRAVENOUS | Status: DC

## 2020-07-01 MED ORDER — FLUCONAZOLE 100 MG PO TABS: 100.0000 mg | ORAL_TABLET | Freq: Every day | ORAL | 0 refills | Status: DC

## 2020-07-01 MED ORDER — FLUCONAZOLE 100 MG PO TABS: 200.0000 mg | ORAL_TABLET | Freq: Once | ORAL | 0 refills | Status: AC

## 2020-07-01 NOTE — Progress Notes (Signed)
Report to PACU, RN, vss, BBS= Clear.  

## 2020-07-01 NOTE — Patient Instructions (Signed)
Information on polyps and diverticulosis given to you today.  Await pathology results.  Resume previous diet and medications.  Fluconazole 200 mg x 1 day, 100mg  x 20 days.  YOU HAD AN ENDOSCOPIC PROCEDURE TODAY AT [facility]:   Refer to the procedure report that was given to you for any specific questions about what was found during the examination.  If the procedure report does not answer your questions, please call your gastroenterologist to clarify.  If you requested that your care partner not be given the details of your procedure findings, then the procedure report has been included in a sealed envelope for you to review at your convenience later.  YOU SHOULD EXPECT: Some feelings of bloating in the abdomen. Passage of more gas than usual.  Walking can help get rid of the air that was put into your GI tract during the procedure and reduce the bloating. If you had a lower endoscopy (such as a colonoscopy or flexible sigmoidoscopy) you may notice spotting of blood in your stool or on the toilet paper. If you underwent a bowel prep for your procedure, you may not have a normal bowel movement for a few days.  Please Note:  You might notice some irritation and congestion in your nose or some drainage.  This is from the oxygen used during your procedure.  There is no need for concern and it should clear up in a day or so.  SYMPTOMS TO REPORT IMMEDIATELY:   Following lower endoscopy (colonoscopy or flexible sigmoidoscopy):  Excessive amounts of blood in the stool  Significant tenderness or worsening of abdominal pains  Swelling of the abdomen that is new, acute  Fever of 100F or higher   Following upper endoscopy (EGD)  Vomiting of blood or coffee ground material  New chest pain or pain under the shoulder blades  Painful or persistently difficult swallowing  New shortness of breath  Fever of 100F or higher  Black, tarry-looking stools  For urgent or emergent issues,  a gastroenterologist can be reached at any hour by calling [phone]. Do not use MyChart messaging for urgent concerns.    DIET:  We do recommend a small meal at first, but then you may proceed to your regular diet.  Drink plenty of fluids but you should avoid alcoholic beverages for 24 hours.  ACTIVITY:  You should plan to take it easy for the rest of today and you should NOT DRIVE or use heavy machinery until tomorrow (because of the sedation medicines used during the test).    FOLLOW UP: Our staff will call the number listed on your records 48-72 hours following your procedure to check on you and address any questions or concerns that you may have regarding the information given to you following your procedure. If we do not reach you, we will leave a message.  We will attempt to reach you two times.  During this call, we will ask if you have developed any symptoms of COVID 19. If you develop any symptoms (ie: fever, flu-like symptoms, shortness of breath, cough etc.) before then, please call [phone].  If you test positive for Covid 19 in the 2 weeks post procedure, please call and report this information to [place].    If any biopsies were taken you will be contacted by phone or by letter within the next 1-3 weeks.  Please call us at [phone] if you have not heard about the biopsies in 3 weeks.  SIGNATURES/CONFIDENTIALITY: You and/or your care partner have signed paperwork which will be entered into your electronic medical record.  These signatures attest to the fact that that the information above on your After Visit Summary has been reviewed and is understood.  Full responsibility of the confidentiality of this discharge information lies with you and/or your care-partner.

## 2020-07-01 NOTE — Progress Notes (Signed)
Called to room to assist during endoscopic procedure.  Patient ID and intended procedure confirmed with present staff. Received instructions for my participation in the procedure from the performing physician.  

## 2020-07-01 NOTE — Op Note (Signed)
[facility] Patient Name: Erin Lopez Procedure Date: 07/01/2020 3:22 PM MRN: [id number] Endoscopist: Jerene Bears , MD Age: 70 Referring MD:  Date of Birth: 07-29-1950 Gender: Female Account #: [id number] Procedure:                Colonoscopy Indications:              High risk colon cancer surveillance: Personal                            history of non-advanced adenoma, Last colonoscopy:                            May 2016 Medicines:                Monitored Anesthesia Care Procedure:                Pre-Anesthesia Assessment:                           - Prior to the procedure, a History and Physical                            was performed, and patient medications and                            allergies were reviewed. The patient's tolerance of                            previous anesthesia was also reviewed. The risks                            and benefits of the procedure and the sedation                            options and risks were discussed with the patient.                            All questions were answered, and informed consent                            was obtained. Prior Anticoagulants: The patient has                            taken no previous anticoagulant or antiplatelet                            agents. ASA Grade Assessment: III - A patient with                            severe systemic disease. After reviewing the risks                            and benefits, the patient was deemed in  satisfactory condition to undergo the procedure.                           After obtaining informed consent, the colonoscope                            was passed under direct vision. Throughout the                            procedure, the patient's blood pressure, pulse, and                            oxygen saturations were monitored continuously. The                            Olympus [id number]-[id number] (#[id number]) Colonoscope was                             introduced through the anus and advanced to the                            cecum, identified by appendiceal orifice and                            ileocecal valve. The colonoscopy was performed                            without difficulty. The patient tolerated the                            procedure well. The quality of the bowel                            preparation was good. The ileocecal valve,                            appendiceal orifice, and rectum were photographed. Scope In: 3:36:44 PM Scope Out: 3:49:26 PM Scope Withdrawal Time: 0 hours 8 minutes 58 seconds  Total Procedure Duration: 0 hours 12 minutes 42 seconds  Findings:                 The digital rectal exam was normal.                           A 4 mm polyp was found in the descending colon. The                            polyp was sessile. The polyp was removed with a                            cold snare. Resection and retrieval were complete.                           Multiple small and large-mouthed diverticula were  found in the sigmoid colon and descending colon.                           The exam was otherwise without abnormality on                            direct and retroflexion views. Complications:            No immediate complications. Estimated Blood Loss:     Estimated blood loss was minimal. Impression:               - One 4 mm polyp in the descending colon, removed                            with a cold snare. Resected and retrieved.                           - Diverticulosis in the sigmoid colon and in the                            descending colon.                           - The examination was otherwise normal on direct                            and retroflexion views. Recommendation:           - Patient has a contact number available for                            emergencies. The signs and symptoms of potential                             delayed complications were discussed with the                            patient. Return to normal activities tomorrow.                            Written discharge instructions were provided to the                            patient.                           - Resume previous diet.                           - Continue present medications.                           - Await pathology results.                           - Repeat colonoscopy is recommended for  surveillance. The colonoscopy date will be                            determined after pathology results from today's                            exam become available for review. Jerene Bears, MD 07/01/2020 3:57:53 PM This report has been signed electronically.

## 2020-07-01 NOTE — Op Note (Signed)
[facility] Patient Name: Erin Lopez Procedure Date: 07/01/2020 3:23 PM MRN: [id number] Endoscopist: Jerene Bears , MD Age: 70 Referring MD:  Date of Birth: Jun 21, 1950 Gender: Female Account #: [id number] Procedure:                Upper GI endoscopy Indications:              Dysphagia, Gastro-esophageal reflux disease Medicines:                Monitored Anesthesia Care Procedure:                Pre-Anesthesia Assessment:                           - Prior to the procedure, a History and Physical                            was performed, and patient medications and                            allergies were reviewed. The patient's tolerance of                            previous anesthesia was also reviewed. The risks                            and benefits of the procedure and the sedation                            options and risks were discussed with the patient.                            All questions were answered, and informed consent                            was obtained. Prior Anticoagulants: The patient has                            taken no previous anticoagulant or antiplatelet                            agents. ASA Grade Assessment: III - A patient with                            severe systemic disease. After reviewing the risks                            and benefits, the patient was deemed in                            satisfactory condition to undergo the procedure.                           After obtaining informed consent, the endoscope was  passed under direct vision. Throughout the                            procedure, the patient's blood pressure, pulse, and                            oxygen saturations were monitored continuously. The                            Endoscope was introduced through the mouth, and                            advanced to the second part of duodenum. The upper                            GI  endoscopy was accomplished without difficulty.                            The patient tolerated the procedure well. Scope In: Scope Out: Findings:                 Diffuse, white plaques were found in the entire                            esophagus.                           The exam of the esophagus was otherwise normal.                           The entire examined stomach was normal.                           The examined duodenum was normal. Complications:            No immediate complications. Estimated Blood Loss:     Estimated blood loss: none. Impression:               - Esophageal plaques were found, consistent with                            candidiasis.                           - Normal stomach.                           - Normal examined duodenum.                           - No specimens collected. Recommendation:           - Patient has a contact number available for                            emergencies. The signs and symptoms of potential  delayed complications were discussed with the                            patient. Return to normal activities tomorrow.                            Written discharge instructions were provided to the                            patient.                           - Resume previous diet.                           - Continue present medications.                           - Fluconazole 200 mg x 1 day, 100 mg x 20 days.                           - If dysphagia symptom persist after treatment for                            Candida esophagitis then please let my office know                            and we will proceed with barium esophagram with                            tablet. Jerene Bears, MD 07/01/2020 3:55:23 PM This report has been signed electronically.

## 2020-07-03 ENCOUNTER — Telehealth: Payer: Self-pay

## 2020-07-03 NOTE — Telephone Encounter (Signed)
  Follow up Call-  Call back number 07/01/2020  Post procedure Call Back phone  # [phone]  Permission to leave phone message Yes  Some recent data might be hidden     Patient questions:  Do you have a fever, pain , or abdominal swelling? No. Pain Score  0 *  Have you tolerated food without any problems? Yes.    Have you been able to return to your normal activities? Yes.    Do you have any questions about your discharge instructions: Diet   no Medications  No. Follow up visit  No.  Do you have questions or concerns about your Care? No.  Actions: * If pain score is 4 or above: No action needed, pain <4.  1. Have you developed a fever since your procedure? no  2.   Have you had an respiratory symptoms (SOB or cough) since your procedure? no  3.   Have you tested positive for COVID 19 since your procedure no  4.   Have you had any family members/close contacts diagnosed with the COVID 19 since your procedure?  no   If yes to any of these questions please route to Joylene John, RN and Joella Prince, RN \

## 2020-07-03 NOTE — Telephone Encounter (Signed)
  Follow up Call-  Call back number 07/01/2020  Post procedure Call Back phone  # [phone]  Permission to leave phone message Yes  Some recent data might be hidden     Patient questions:  Do you have a fever, pain , or abdominal swelling? No. Pain Score  0 *  Have you tolerated food without any problems? Yes.    Have you been able to return to your normal activities? Yes.    Do you have any questions about your discharge instructions: Diet   No. Medications  No. Follow up visit  No.  Do you have questions or concerns about your Care? No.  Actions: * If pain score is 4 or above: No action needed, pain <4.  1. Have you developed a fever since your procedure? no  2.   Have you had an respiratory symptoms (SOB or cough) since your procedure? no  3.   Have you tested positive for COVID 19 since your procedure no  4.   Have you had any family members/close contacts diagnosed with the COVID 19 since your procedure?  no   If yes to any of these questions please route to Joylene John, RN and Joella Prince, RN

## 2020-07-09 ENCOUNTER — Encounter: Payer: Self-pay | Admitting: Internal Medicine

## 2020-07-09 ENCOUNTER — Other Ambulatory Visit: Payer: Self-pay | Admitting: Family Medicine

## 2020-07-13 ENCOUNTER — Other Ambulatory Visit: Payer: Self-pay | Admitting: Pulmonary Disease

## 2020-07-17 ENCOUNTER — Other Ambulatory Visit (HOSPITAL_COMMUNITY): Payer: Self-pay | Admitting: Physician Assistant

## 2020-07-17 ENCOUNTER — Other Ambulatory Visit: Payer: Self-pay | Admitting: Physician Assistant

## 2020-07-17 DIAGNOSIS — M25562 Pain in left knee: Secondary | ICD-10-CM | POA: Diagnosis not present

## 2020-07-17 DIAGNOSIS — M25561 Pain in right knee: Secondary | ICD-10-CM | POA: Diagnosis not present

## 2020-07-17 DIAGNOSIS — Z96659 Presence of unspecified artificial knee joint: Secondary | ICD-10-CM | POA: Diagnosis not present

## 2020-07-17 DIAGNOSIS — M1712 Unilateral primary osteoarthritis, left knee: Secondary | ICD-10-CM | POA: Diagnosis not present

## 2020-07-18 ENCOUNTER — Other Ambulatory Visit: Payer: Self-pay | Admitting: Internal Medicine

## 2020-07-18 DIAGNOSIS — K219 Gastro-esophageal reflux disease without esophagitis: Secondary | ICD-10-CM

## 2020-07-29 ENCOUNTER — Encounter (HOSPITAL_COMMUNITY): Payer: Self-pay

## 2020-07-29 ENCOUNTER — Encounter (HOSPITAL_COMMUNITY): Payer: Medicare Other

## 2020-08-07 DIAGNOSIS — M25561 Pain in right knee: Secondary | ICD-10-CM | POA: Diagnosis not present

## 2020-08-07 DIAGNOSIS — Z96651 Presence of right artificial knee joint: Secondary | ICD-10-CM | POA: Diagnosis not present

## 2020-08-14 ENCOUNTER — Encounter (HOSPITAL_COMMUNITY)
Admission: RE | Admit: 2020-08-14 | Discharge: 2020-08-14 | Disposition: A | Discharge status: Home / Self Care | Payer: Medicare Other | Source: Ambulatory Visit | Attending: Physician Assistant | Admitting: Physician Assistant

## 2020-08-14 ENCOUNTER — Other Ambulatory Visit: Payer: Self-pay | Admitting: Internal Medicine

## 2020-08-14 ENCOUNTER — Other Ambulatory Visit: Payer: Self-pay

## 2020-08-14 ENCOUNTER — Other Ambulatory Visit: Payer: Self-pay | Admitting: Family Medicine

## 2020-08-14 DIAGNOSIS — M25561 Pain in right knee: Secondary | ICD-10-CM | POA: Diagnosis not present

## 2020-08-14 DIAGNOSIS — M1712 Unilateral primary osteoarthritis, left knee: Secondary | ICD-10-CM | POA: Diagnosis not present

## 2020-08-14 DIAGNOSIS — K219 Gastro-esophageal reflux disease without esophagitis: Secondary | ICD-10-CM

## 2020-08-14 DIAGNOSIS — M25562 Pain in left knee: Secondary | ICD-10-CM | POA: Diagnosis not present

## 2020-08-14 MED ORDER — TECHNETIUM TC 99M MEDRONATE IV KIT
20.0000 | PACK | Freq: Once | INTRAVENOUS | Status: AC | PRN
  Administered 2020-08-14: 20 via INTRAVENOUS

## 2020-09-11 ENCOUNTER — Other Ambulatory Visit: Payer: Self-pay | Admitting: Family Medicine

## 2020-09-12 ENCOUNTER — Other Ambulatory Visit: Payer: Self-pay | Admitting: Cardiology

## 2020-09-21 ENCOUNTER — Other Ambulatory Visit: Payer: Self-pay | Admitting: Internal Medicine

## 2020-09-21 ENCOUNTER — Other Ambulatory Visit: Payer: Self-pay | Admitting: Family Medicine

## 2020-09-21 DIAGNOSIS — M21612 Bunion of left foot: Secondary | ICD-10-CM | POA: Diagnosis not present

## 2020-09-21 DIAGNOSIS — M79672 Pain in left foot: Secondary | ICD-10-CM | POA: Diagnosis not present

## 2020-09-21 DIAGNOSIS — K219 Gastro-esophageal reflux disease without esophagitis: Secondary | ICD-10-CM

## 2020-09-21 DIAGNOSIS — T8484XA Pain due to internal orthopedic prosthetic devices, implants and grafts, initial encounter: Secondary | ICD-10-CM | POA: Diagnosis not present

## 2020-09-21 DIAGNOSIS — M2022 Hallux rigidus, left foot: Secondary | ICD-10-CM | POA: Diagnosis not present

## 2020-09-21 DIAGNOSIS — M19072 Primary osteoarthritis, left ankle and foot: Secondary | ICD-10-CM | POA: Diagnosis not present

## 2020-09-21 DIAGNOSIS — M2042 Other hammer toe(s) (acquired), left foot: Secondary | ICD-10-CM | POA: Diagnosis not present

## 2020-09-22 NOTE — Telephone Encounter (Signed)
Requesting: Alprazolam  Contract: 03/30/20 UDS: 09/19/19 Last Visit: 03/30/20 Next Visit: 10/15/20 Last Refill: 03/16/20  Please Advise

## 2020-09-28 ENCOUNTER — Other Ambulatory Visit: Payer: Self-pay | Admitting: Family Medicine

## 2020-10-01 DIAGNOSIS — Z96651 Presence of right artificial knee joint: Secondary | ICD-10-CM | POA: Diagnosis not present

## 2020-10-07 ENCOUNTER — Encounter: Payer: Medicare Other | Admitting: Family Medicine

## 2020-10-07 ENCOUNTER — Telehealth: Payer: Self-pay | Admitting: *Deleted

## 2020-10-07 NOTE — Telephone Encounter (Signed)
   [facility] Pre-operative Risk Assessment    Patient Name: Erin Lopez  DOB: Jun 13, 1950  MRN: [id number]   HEARTCARE STAFF: - Please ensure there is not already an duplicate clearance open for this procedure. - Under Visit Info/Reason for Call, type in Other and utilize the format Clearance MM/DD/YY or Clearance TBD. Do not use dashes or single digits. - If request is for dental extraction, please clarify the # of teeth to be extracted. - If the patient is currently at the dentist's office, call Pre-Op APP to address. If the patient is not currently in the dentist office, please route to the Pre-Op pool  Request for surgical clearance:  What type of surgery is being performed? RIGHT KNEE POLY vs TKA REVISION   When is this surgery scheduled? 01/06/21   What type of clearance is required (medical clearance vs. Pharmacy clearance to hold med vs. Both)? MEDICAL  Are there any medications that need to be held prior to surgery and how long? ASA    Practice name and name of physician performing surgery? [facility]; DR. Gaynelle Arabian  What is the office phone number? [phone]   7.   What is the office fax number? [phone] ATTN: Erin Lopez  8.   Anesthesia type (None, local, MAC, general) ? CHOICE   Erin Lopez 10/07/2020, 5:10 PM  _________________________________________________________________   (provider comments below)

## 2020-10-07 NOTE — Telephone Encounter (Signed)
   Name: Erin Lopez  DOB: 13-Mar-1951  MRN: [id number]  Primary Cardiologist: Fransico Him, MD  Chart reviewed as part of pre-operative protocol coverage. Because of Erin Lopez's past medical history and time since last visit, she will require a follow-up visit in order to better assess preoperative cardiovascular risk.  Last telemed visit was 12/2019 with recommendation to f/u in 1 year - last in person visit was 10/2019 so needs in person visit closer to surgery time (I.e. September 2022) with EKG for clearance.   Pre-op covering staff: - Please schedule appointment and call patient to inform them. If patient already had an upcoming appointment within acceptable timeframe, please add "pre-op clearance" to the appointment notes so provider is aware. - Please contact requesting surgeon's office via preferred method (i.e, phone, fax) to inform them of need for appointment prior to surgery.  Patient has previously interrupted her antiplatelet therapy in 12/2019 per phone note due to stomach issues, ongoing rx plan can be reviewed at [facility].  Charlie Pitter, PA-C  10/07/2020, 5:19 PM

## 2020-10-08 ENCOUNTER — Telehealth: Payer: Self-pay

## 2020-10-08 NOTE — Telephone Encounter (Signed)
Surgical clearance forms received on 10/07/20. Patient has been scheduled on 10/15/20 for CPE appt. Forms have been placed on PCP desk on 10/08/20

## 2020-10-08 NOTE — Telephone Encounter (Signed)
Left message for the pt to call back to schedule appt for pre op clearance. Even though procedure is not until 01/2021, the schedules are filling up so very quickly. Please schedule appt with Dr. Radford Pax or APP.

## 2020-10-09 NOTE — Telephone Encounter (Signed)
Pt has been scheduled to see Ermalinda Barrios, PA-C, 12/02/2020.  Will route back to the requesting surgeon's office to make them aware clearance will be addressed at that time.

## 2020-10-10 ENCOUNTER — Other Ambulatory Visit: Payer: Self-pay | Admitting: Internal Medicine

## 2020-10-10 DIAGNOSIS — K219 Gastro-esophageal reflux disease without esophagitis: Secondary | ICD-10-CM

## 2020-10-11 ENCOUNTER — Other Ambulatory Visit: Payer: Self-pay | Admitting: Family Medicine

## 2020-10-15 ENCOUNTER — Encounter: Payer: Medicare Other | Admitting: Family Medicine

## 2020-10-27 ENCOUNTER — Other Ambulatory Visit: Payer: Self-pay

## 2020-10-29 ENCOUNTER — Encounter: Payer: Self-pay | Admitting: Family Medicine

## 2020-10-29 ENCOUNTER — Ambulatory Visit (INDEPENDENT_AMBULATORY_CARE_PROVIDER_SITE_OTHER): Payer: Medicare Other | Admitting: Family Medicine

## 2020-10-29 ENCOUNTER — Other Ambulatory Visit: Payer: Self-pay

## 2020-10-29 VITALS — BP 136/78 | HR 88 | Temp 97.7°F | Resp 20 | Ht 66.0 in | Wt 182.6 lb

## 2020-10-29 DIAGNOSIS — Z0000 Encounter for general adult medical examination without abnormal findings: Secondary | ICD-10-CM | POA: Diagnosis not present

## 2020-10-29 DIAGNOSIS — E7800 Pure hypercholesterolemia, unspecified: Secondary | ICD-10-CM | POA: Diagnosis not present

## 2020-10-29 DIAGNOSIS — F411 Generalized anxiety disorder: Secondary | ICD-10-CM

## 2020-10-29 DIAGNOSIS — R7303 Prediabetes: Secondary | ICD-10-CM

## 2020-10-29 DIAGNOSIS — F5104 Psychophysiologic insomnia: Secondary | ICD-10-CM

## 2020-10-29 DIAGNOSIS — I10 Essential (primary) hypertension: Secondary | ICD-10-CM

## 2020-10-29 DIAGNOSIS — Z79899 Other long term (current) drug therapy: Secondary | ICD-10-CM | POA: Diagnosis not present

## 2020-10-29 DIAGNOSIS — Z01818 Encounter for other preprocedural examination: Secondary | ICD-10-CM | POA: Diagnosis not present

## 2020-10-29 LAB — LIPID PANEL
Cholesterol: 157 mg/dL (ref 0–200)
HDL: 55.1 mg/dL (ref >39.00)
LDL Cholesterol: 77 mg/dL (ref 0–99)
NonHDL: 101.65
Total CHOL/HDL Ratio: 3
Triglycerides: 125 mg/dL (ref 0.0–149.0)
VLDL: 25 mg/dL (ref 0.0–40.0)

## 2020-10-29 LAB — COMPREHENSIVE METABOLIC PANEL
ALT: 20 U/L (ref 0–35)
AST: 19 U/L (ref 0–37)
Albumin: 4.8 g/dL (ref 3.5–5.2)
Alkaline Phosphatase: 67 U/L (ref 39–117)
BUN: 21 mg/dL (ref 6–23)
CO2: 28 mEq/L (ref 19–32)
Calcium: 10 mg/dL (ref 8.4–10.5)
Chloride: 99 mEq/L (ref 96–112)
Creatinine, Ser: 0.98 mg/dL (ref 0.40–1.20)
GFR: 59.59 mL/min — ABNORMAL LOW (ref >60.00)
Glucose, Bld: 102 mg/dL — ABNORMAL HIGH (ref 70–99)
Potassium: 3.7 mEq/L (ref 3.5–5.1)
Sodium: 137 mEq/L (ref 135–145)
Total Bilirubin: 0.7 mg/dL (ref 0.2–1.2)
Total Protein: 6.9 g/dL (ref 6.0–8.3)

## 2020-10-29 LAB — CBC WITH DIFFERENTIAL/PLATELET
Basophils Absolute: 0 10*3/uL (ref 0.0–0.1)
Basophils Relative: 0.5 % (ref 0.0–3.0)
Eosinophils Absolute: 0.2 10*3/uL (ref 0.0–0.7)
Eosinophils Relative: 2.9 % (ref 0.0–5.0)
HCT: 35.9 % — ABNORMAL LOW (ref 36.0–46.0)
Hemoglobin: 12.4 g/dL (ref 12.0–15.0)
Lymphocytes Relative: 25.1 % (ref 12.0–46.0)
Lymphs Abs: 1.5 10*3/uL (ref 0.7–4.0)
MCHC: 34.6 g/dL (ref 30.0–36.0)
MCV: 91.5 fl (ref 78.0–100.0)
Monocytes Absolute: 0.5 10*3/uL (ref 0.1–1.0)
Monocytes Relative: 8.4 % (ref 3.0–12.0)
Neutro Abs: 3.7 10*3/uL (ref 1.4–7.7)
Neutrophils Relative %: 63.1 % (ref 43.0–77.0)
Platelets: 264 10*3/uL (ref 150.0–400.0)
RBC: 3.93 Mil/uL (ref 3.87–5.11)
RDW: 13.1 % (ref 11.5–15.5)
WBC: 5.9 10*3/uL (ref 4.0–10.5)

## 2020-10-29 LAB — HEMOGLOBIN A1C: Hgb A1c MFr Bld: 5.7 % (ref 4.6–6.5)

## 2020-10-29 MED ORDER — FAMOTIDINE 40 MG PO TABS: 40.0000 mg | ORAL_TABLET | Freq: Every day | ORAL | 3 refills | Status: DC

## 2020-10-29 MED ORDER — ATORVASTATIN CALCIUM 80 MG PO TABS: ORAL_TABLET | ORAL | 3 refills | Status: DC

## 2020-10-29 MED ORDER — CITALOPRAM HYDROBROMIDE 20 MG PO TABS: 20.0000 mg | ORAL_TABLET | Freq: Two times a day (BID) | ORAL | 3 refills | Status: DC
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
## 2020-11-01 LAB — DRUG MONITORING PANEL 376104, URINE

## 2020-11-01 LAB — DM TEMPLATE

## 2020-11-10 DIAGNOSIS — H2513 Age-related nuclear cataract, bilateral: Secondary | ICD-10-CM | POA: Diagnosis not present

## 2020-11-10 DIAGNOSIS — H524 Presbyopia: Secondary | ICD-10-CM | POA: Diagnosis not present

## 2020-11-18 ENCOUNTER — Other Ambulatory Visit: Payer: Self-pay | Admitting: Family Medicine

## 2020-11-26 ENCOUNTER — Other Ambulatory Visit: Payer: Self-pay | Admitting: Family Medicine
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
## 2020-12-02 ENCOUNTER — Ambulatory Visit: Payer: Medicare Other | Admitting: Physician Assistant

## 2020-12-02 ENCOUNTER — Encounter: Payer: Self-pay | Admitting: Physician Assistant

## 2020-12-02 ENCOUNTER — Other Ambulatory Visit: Payer: Self-pay

## 2020-12-02 VITALS — BP 134/82 | HR 72 | Ht 67.0 in | Wt 187.2 lb

## 2020-12-02 DIAGNOSIS — J449 Chronic obstructive pulmonary disease, unspecified: Secondary | ICD-10-CM

## 2020-12-02 DIAGNOSIS — I2510 Atherosclerotic heart disease of native coronary artery without angina pectoris: Secondary | ICD-10-CM

## 2020-12-02 DIAGNOSIS — E785 Hyperlipidemia, unspecified: Secondary | ICD-10-CM

## 2020-12-02 DIAGNOSIS — I10 Essential (primary) hypertension: Secondary | ICD-10-CM

## 2020-12-02 DIAGNOSIS — Z01818 Encounter for other preprocedural examination: Secondary | ICD-10-CM

## 2020-12-02 NOTE — Patient Instructions (Signed)
Medication Instructions:  Your physician recommends that you continue on your current medications as directed. Please refer to the Current Medication list given to you today.  *If you need a refill on your cardiac medications before your next appointment, please call your pharmacy*   Lab Work: NONE If you have labs (blood work) drawn today and your tests are completely normal, you will receive your results only by: [place] (if you have MyChart) OR A paper copy in the mail If you have any lab test that is abnormal or we need to change your treatment, we will call you to review the results.   Testing/Procedures: NONE   Follow-Up: At [facility], you and your health needs are our priority.  As part of our continuing mission to provide you with exceptional heart care, we have created designated Provider Care Teams.  These Care Teams include your primary Cardiologist (physician) and Advanced Practice Providers (APPs -  Physician Assistants and Nurse Practitioners) who all work together to provide you with the care you need, when you need it.  We recommend signing up for the patient portal called "MyChart".  Sign up information is provided on this After Visit Summary.  MyChart is used to connect with patients for Virtual Visits (Telemedicine).  Patients are able to view lab/test results, encounter notes, upcoming appointments, etc.  Non-urgent messages can be sent to your provider as well.   To learn more about what you can do with MyChart, go to [email or web address].    Your next appointment:   6 month(s)  The format for your next appointment:   In Person  Provider:   You may see Fransico Him, MD or one of the following Advanced Practice Providers on your designated Care Team:   Cecilie Kicks, NP

## 2020-12-09 ENCOUNTER — Ambulatory Visit (INDEPENDENT_AMBULATORY_CARE_PROVIDER_SITE_OTHER): Payer: Medicare Other | Admitting: Pulmonary Disease

## 2020-12-09 ENCOUNTER — Other Ambulatory Visit: Payer: Self-pay

## 2020-12-09 ENCOUNTER — Ambulatory Visit (INDEPENDENT_AMBULATORY_CARE_PROVIDER_SITE_OTHER): Payer: Medicare Other

## 2020-12-09 ENCOUNTER — Encounter: Payer: Self-pay | Admitting: Pulmonary Disease

## 2020-12-09 VITALS — BP 124/68 | HR 74 | Ht 67.5 in | Wt 186.6 lb

## 2020-12-09 DIAGNOSIS — J449 Chronic obstructive pulmonary disease, unspecified: Secondary | ICD-10-CM

## 2020-12-09 DIAGNOSIS — J439 Emphysema, unspecified: Secondary | ICD-10-CM | POA: Diagnosis not present

## 2020-12-09 MED ORDER — STIOLTO RESPIMAT 2.5-2.5 MCG/ACT IN AERS: 2.0000 | INHALATION_SPRAY | Freq: Every day | RESPIRATORY_TRACT | 0 refills | Status: DC

## 2020-12-09 MED ORDER — STIOLTO RESPIMAT 2.5-2.5 MCG/ACT IN AERS: 2.0000 | INHALATION_SPRAY | Freq: Every day | RESPIRATORY_TRACT | 5 refills | Status: DC

## 2020-12-09 MED ORDER — FLUCONAZOLE 100 MG PO TABS: ORAL_TABLET | ORAL | 0 refills | Status: DC

## 2020-12-09 NOTE — Patient Instructions (Signed)
We will stop the Anoro and start you on another inhaler called stiolto Prescribe fluconazole 200 mg x 1 day, 100 mg x 7 days Follow-up in 6 months

## 2020-12-09 NOTE — Progress Notes (Addendum)
KM WEATHERBEE    [id number]    [birth year]  Primary Care Physician:McGowen, Adrian Blackwater, MD  Referring Physician: Tammi Sou, MD [address] [place],  [place] [address]  Chief complaint: Follow-up for COPD, preop for knee surgery  HPI: Erin Lopez is a 70 year old with smoking history.  Here for management of COPD.  She has followed intermittently with me since 2017.  Initially on Advair inhaler which is changed to Anoro with good response Evaluated for OSA with negative sleep study a few years ago.  Pets: Dogs.  No cats.  Used to have coronary and parakeet 20 years ago. 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 Exposures: No known exposures Smoking history: 30-pack-year smoking history.  Quit in 1995 Travel History: Not significant.  Interim history: Continues on anoro inhaler  She had an EGD in March 2022 and was diagnosed with esophageal candidiasis.  She was treated with Diflucan She still feels that she has persistent candidiasis and reports vaginal discharge  Notes mild increase in dyspnea over the past 6 months which he attributes to weight gain.  Denies any cough, wheezing, sputum production  She is scheduled for right knee replacement in October and is here for preop evaluation  Referred to pulmonary rehab at last visit but did not keep appointment.  Outpatient Encounter Medications as of 12/09/2020  Medication Sig   acetaminophen (TYLENOL) 500 MG tablet Take 1,000 mg by mouth every 6 (six) hours as needed for mild pain or headache.    albuterol (VENTOLIN HFA) 108 (90 Base) MCG/ACT inhaler INHALE 2 PUFFS INTO THE LUNGS EVERY 4 HOURS AS NEEDED FOR WHEEZE OR FOR SHORTNESS OF BREATH   ALPRAZolam (XANAX) 0.5 MG tablet TAKE 1 TABLET BY MOUTH THREE TIMES A DAY AS NEEDED FOR ANXIETY   aspirin EC 81 MG EC tablet Take 1 tablet (81 mg total) by mouth daily.   atorvastatin (LIPITOR) 80 MG tablet TAKE 1 TABLET BY MOUTH DAILY  AT 6 PM.   citalopram (CELEXA) 20 MG tablet Take 1 tablet (20 mg total) by mouth in the morning and at bedtime.   cyclobenzaprine (FLEXERIL) 10 MG tablet TAKE 1 TABLET BY MOUTH TWICE A DAY   doxycycline (VIBRAMYCIN) 50 MG capsule Take 50 mg by mouth daily.   famotidine (PEPCID) 40 MG tablet Take 1 tablet (40 mg total) by mouth daily.   fluconazole (DIFLUCAN) 100 MG tablet Take 1 tablet (100 mg total) by mouth daily.   fluticasone (FLONASE) 50 MCG/ACT nasal spray SPRAY 2 SPRAYS INTO EACH NOSTRIL EVERY DAY   hydrochlorothiazide (HYDRODIURIL) 12.5 MG tablet TAKE 1 TABLET BY MOUTH EVERY DAY   ipratropium-albuterol (DUONEB) 0.5-2.5 (3) MG/3ML SOLN TAKE 3 MLS BY NEBULIZATION 3 (THREE) TIMES DAILY AS NEEDED.   lamoTRIgine (LAMICTAL) 100 MG tablet TAKE 3 TABLETS (300 MG TOTAL) BY MOUTH AT BEDTIME.   losartan (COZAAR) 25 MG tablet TAKE 1 TABLET BY MOUTH EVERY DAY   Magnesium 250 MG TABS Take 250 mg by mouth daily.    metoprolol succinate (TOPROL-XL) 100 MG 24 hr tablet Take 1 tablet (100 mg total) by mouth daily. Take with or immediately following a meal. Please call and schedule a one year follow up for further refills 1st attempt   montelukast (SINGULAIR) 10 MG tablet TAKE 1 TABLET BY MOUTH EVERY DAY IN THE EVENING   Multiple Vitamin (MULTI-VITAMINS) TABS Take 1 tablet by mouth daily.  nitroGLYCERIN (NITROSTAT) 0.4 MG SL tablet Place 1 tablet (0.4 mg total) under the tongue every 5 (five) minutes x 3 doses as needed for chest pain.   pantoprazole (PROTONIX) 40 MG tablet TAKE 1 TABLET BY MOUTH EVERY DAY   polyethylene glycol powder (GLYCOLAX/MIRALAX) powder TAKE 17 G BY MOUTH DAILY.   potassium citrate (UROCIT-K) 10 MEQ (1080 MG) SR tablet Take 10 mEq by mouth 2 (two) times a day.   Propylene Glycol 0.6 % SOLN Apply 1 drop to eye 2 (two) times daily.   tamsulosin (FLOMAX) 0.4 MG CAPS capsule Take 0.4 mg by mouth daily.   triamcinolone cream (KENALOG) 0.1 % Apply 1 application topically daily.    umeclidinium-vilanterol (ANORO ELLIPTA) 62.5-25 MCG/INH AEPB Inhale 1 puff into the lungs daily.   zolpidem (AMBIEN) 10 MG tablet TAKE 1 TABLET BY MOUTH EVERYDAY AT BEDTIME   No facility-administered encounter medications on file as of 12/09/2020.   Physical Exam: Blood pressure 124/68, pulse 74, height 5' 7.5" (1.715 m), weight 186 lb 9.6 oz (84.6 kg), SpO2 95 %. Gen:      No acute distress HEENT:  EOMI, sclera anicteric Neck:     No masses; no thyromegaly Lungs:    Clear to auscultation bilaterally; normal respiratory effort CV:         Regular rate and rhythm; no murmurs Abd:      + bowel sounds; soft, non-tender; no palpable masses, no distension Ext:    No edema; adequate peripheral perfusion Skin:      Warm and dry; no rash Neuro: alert and oriented x 3 Psych: normal mood and affect   Data Reviewed: Imaging: CT abd 08/06/13.- Linear bilateral lower lobe atelectasis or scarring noted. Areas of mild bronchiectasis are noted.  Chest x-ray 04/25/17- mild basilar atelectasis, minimal blunting of the costophrenic angle. Chest x-ray 06/05/2019-no acute cardiopulmonary abnormality. I have reviewed the images personally.  Barium swallow 04/18/08 Minimal hiatal hernia.  GE reflux.   PFTs  06/16/15 FVC 3.01 (85%), FEV1 1.65 (60%), F/F 55 Moderate obstructive defect.  06/12/17 FVC 2.83 [79%], FEV1 1.69 [60%], F/F 60, TLC 122%, RV/TLC 140%, DLCO 43% Moderate obstructive defect, severe diffusion impairment.  Assessment:  Preop evaluation for knee replacement  For Erin Lopez, risk of perioperative pulmonary complications is increased by:  Age greater than 40 years  COPD   By Lynnae January [facility]) preoperative pulmonary risk index in adults she is at low risk with 1.6% chance of perioperative pulmonary complication   Respiratory complications generally occur in 1% of ASA Class I patients, 5% of ASA Class II and 10% of ASA Class III-IV patients These complications rarely result in mortality and  iclude postoperative pneumonia, atelectasis, pulmonary embolism, ARDS and increased time requiring postoperative mechanical ventilation.  Overall, I recommend proceeding with the surgery if the risk for respiratory complications are outweighed by the potential benefits. This will need to be discussed between the patient and surgeon.  To reduce risks of respiratory complications, I recommend: --Pre- and post-operative incentive spirometry performed frequently while awake --Avoiding use of pancuronium during anesthesia.  I have discussed the risk factors and recommendations above with the patient.  Get chest x-ray today  Moderate COPD Currently on anoro inhaler which she attributes recurrent candidiasis. We discussed today that anoro has low possibility of causing candidiasis that it does not have inhaled corticosteroid Still, given her concern I will switch her to stiolto  Will need pulmonary rehab when she is mobile and completes physical therapy after knee  surgery  Recurrent candidiasis Prescribe fluconazole  200 mg x 1 day, 100 mg x 7 days  Health maintenance Up-to-date with Covid vaccine.  She is planning on getting a booster shot Flu vaccine today  Plan/Recommendations: - Stop anoro, start stiolto - Fluconazole for candidiasis - No contraindications to knee surgery - Chest x-ray today  Marshell Garfinkel MD [facility] 12/09/2020, 3:07 PM  CC: McGowen, Adrian Blackwater, MD

## 2020-12-09 NOTE — Progress Notes (Deleted)
Erin Lopez    [id number]    07/15/1950  Primary Care Physician:McGowen, Adrian Blackwater, MD  Referring Physician: Tammi Sou, MD [address] [place],  [place] [address]  Chief complaint: Follow-up for COPD  HPI: Erin Lopez is a 70 year old with smoking history.  Here for management of COPD.  She has followed intermittently with me since 2017.  Initially on Advair inhaler which is changed to Anoro with good response Evaluated for OSA with negative sleep study a few years ago.  Pets: Dogs.  No cats.  Used to have coronary and parakeet 20 years ago. 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 Exposures: No known exposures Smoking history: 30-pack-year smoking history.  Quit in 1995 Travel History: Not significant.  Interim history: Continues on Anoro inhaler with stable symptoms.  No new complaints today  Outpatient Encounter Medications as of 12/09/2020  Medication Sig   acetaminophen (TYLENOL) 500 MG tablet Take 1,000 mg by mouth every 6 (six) hours as needed for mild pain or headache.    albuterol (VENTOLIN HFA) 108 (90 Base) MCG/ACT inhaler INHALE 2 PUFFS INTO THE LUNGS EVERY 4 HOURS AS NEEDED FOR WHEEZE OR FOR SHORTNESS OF BREATH   ALPRAZolam (XANAX) 0.5 MG tablet TAKE 1 TABLET BY MOUTH THREE TIMES A DAY AS NEEDED FOR ANXIETY   aspirin EC 81 MG EC tablet Take 1 tablet (81 mg total) by mouth daily.   atorvastatin (LIPITOR) 80 MG tablet TAKE 1 TABLET BY MOUTH DAILY AT 6 PM.   citalopram (CELEXA) 20 MG tablet Take 1 tablet (20 mg total) by mouth in the morning and at bedtime.   cyclobenzaprine (FLEXERIL) 10 MG tablet TAKE 1 TABLET BY MOUTH TWICE A DAY   doxycycline (VIBRAMYCIN) 50 MG capsule Take 50 mg by mouth daily.   famotidine (PEPCID) 40 MG tablet Take 1 tablet (40 mg total) by mouth daily.   fluconazole (DIFLUCAN) 100 MG tablet Take 1 tablet (100 mg total) by mouth daily.   fluticasone (FLONASE) 50 MCG/ACT nasal spray  SPRAY 2 SPRAYS INTO EACH NOSTRIL EVERY DAY   hydrochlorothiazide (HYDRODIURIL) 12.5 MG tablet TAKE 1 TABLET BY MOUTH EVERY DAY   ipratropium-albuterol (DUONEB) 0.5-2.5 (3) MG/3ML SOLN TAKE 3 MLS BY NEBULIZATION 3 (THREE) TIMES DAILY AS NEEDED.   lamoTRIgine (LAMICTAL) 100 MG tablet TAKE 3 TABLETS (300 MG TOTAL) BY MOUTH AT BEDTIME.   losartan (COZAAR) 25 MG tablet TAKE 1 TABLET BY MOUTH EVERY DAY   Magnesium 250 MG TABS Take 250 mg by mouth daily.    metoprolol succinate (TOPROL-XL) 100 MG 24 hr tablet Take 1 tablet (100 mg total) by mouth daily. Take with or immediately following a meal. Please call and schedule a one year follow up for further refills 1st attempt   montelukast (SINGULAIR) 10 MG tablet TAKE 1 TABLET BY MOUTH EVERY DAY IN THE EVENING   Multiple Vitamin (MULTI-VITAMINS) TABS Take 1 tablet by mouth daily.    nitroGLYCERIN (NITROSTAT) 0.4 MG SL tablet Place 1 tablet (0.4 mg total) under the tongue every 5 (five) minutes x 3 doses as needed for chest pain.   pantoprazole (PROTONIX) 40 MG tablet TAKE 1 TABLET BY MOUTH EVERY DAY   polyethylene glycol powder (GLYCOLAX/MIRALAX) powder TAKE 17 G BY MOUTH DAILY.   potassium citrate (UROCIT-K) 10 MEQ (1080 MG) SR tablet Take 10 mEq by mouth 2 (two) times a day.   Propylene Glycol  0.6 % SOLN Apply 1 drop to eye 2 (two) times daily.   tamsulosin (FLOMAX) 0.4 MG CAPS capsule Take 0.4 mg by mouth daily.   triamcinolone cream (KENALOG) 0.1 % Apply 1 application topically daily.   umeclidinium-vilanterol (ANORO ELLIPTA) 62.5-25 MCG/INH AEPB Inhale 1 puff into the lungs daily.   zolpidem (AMBIEN) 10 MG tablet TAKE 1 TABLET BY MOUTH EVERYDAY AT BEDTIME   No facility-administered encounter medications on file as of 12/09/2020.   Physical Exam: Blood pressure 128/76, pulse 100, temperature (!) 97.3 F (36.3 C), temperature source Oral, height '5\' 6"'$  (1.676 m), weight 180 lb 12.8 oz (82 kg), SpO2 96 %. Gen:      No acute distress HEENT:  EOMI,  sclera anicteric Neck:     No masses; no thyromegaly Lungs:    Clear to auscultation bilaterally; normal respiratory effort CV:         Regular rate and rhythm; no murmurs Abd:      + bowel sounds; soft, non-tender; no palpable masses, no distension Ext:    No edema; adequate peripheral perfusion Skin:      Warm and dry; no rash Neuro: alert and oriented x 3 Psych: normal mood and affect  Data Reviewed: Imaging: CT abd 08/06/13.- Linear bilateral lower lobe atelectasis or scarring noted. Areas of mild bronchiectasis are noted.  Chest x-ray 04/25/17- mild basilar atelectasis, minimal blunting of the costophrenic angle. Chest x-ray 06/05/2019-no acute cardiopulmonary abnormality. I have reviewed the images personally.  Barium swallow 04/18/08 Minimal hiatal hernia.  GE reflux.   PFTs  06/16/15 FVC 3.01 (85%), FEV1 1.65 (60%), F/F 55 Moderate obstructive defect.  06/12/17 FVC 2.83 [79%], FEV1 1.69 [60%], F/F 60, TLC 122%, RV/TLC 140%, DLCO 43% Moderate obstructive defect, severe diffusion impairment.  Assessment:  Moderate COPD Continue Anoro inhaler She is requesting referral to pulmonary rehab at [facility] which is close to her home  Health maintenance Up-to-date with Covid vaccine.  She is planning on getting a booster shot Flu vaccine today  Plan/Recommendations: - Continue anoro - Pulmonary rehab - Flu vaccine  Marshell Garfinkel MD [facility] Pager [phone] 12/09/2020, 3:11 PM  CC: McGowen, Adrian Blackwater, MD

## 2020-12-22 ENCOUNTER — Other Ambulatory Visit: Payer: Self-pay | Admitting: Family Medicine

## 2020-12-25 ENCOUNTER — Encounter (HOSPITAL_COMMUNITY): Payer: Medicare Other

## 2020-12-30 ENCOUNTER — Other Ambulatory Visit: Payer: Self-pay | Admitting: Cardiology

## 2020-12-30 ENCOUNTER — Other Ambulatory Visit: Payer: Self-pay | Admitting: Family Medicine

## 2020-12-30 ENCOUNTER — Other Ambulatory Visit: Payer: Self-pay | Admitting: Internal Medicine

## 2020-12-30 DIAGNOSIS — K219 Gastro-esophageal reflux disease without esophagitis: Secondary | ICD-10-CM

## 2020-12-31 ENCOUNTER — Other Ambulatory Visit: Payer: Self-pay | Admitting: Internal Medicine

## 2021-01-04 ENCOUNTER — Other Ambulatory Visit: Payer: Self-pay | Admitting: Family Medicine

## 2021-01-05 NOTE — Telephone Encounter (Signed)
Requesting:zolpidem Contract: n/a UDS:10/29/20 Last Visit:10/29/20 Next Visit: 04/30/21 Last Refill:06/21/20(90,1)  Please Advise. Med pending

## 2021-01-06 ENCOUNTER — Inpatient Hospital Stay: Admit: 2021-01-06 | Payer: Medicare Other | Admitting: Orthopedic Surgery

## 2021-01-06 SURGERY — TOTAL KNEE REVISION
Anesthesia: Choice | Site: Knee | Laterality: Right

## 2021-01-14 ENCOUNTER — Encounter (HOSPITAL_BASED_OUTPATIENT_CLINIC_OR_DEPARTMENT_OTHER): Payer: Self-pay | Admitting: *Deleted

## 2021-01-14 ENCOUNTER — Emergency Department (HOSPITAL_BASED_OUTPATIENT_CLINIC_OR_DEPARTMENT_OTHER)
Admission: EM | Admit: 2021-01-14 | Discharge: 2021-01-14 | Disposition: A | Discharge status: Home / Self Care | Payer: Medicare Other | Attending: Emergency Medicine | Admitting: Emergency Medicine

## 2021-01-14 ENCOUNTER — Emergency Department (HOSPITAL_BASED_OUTPATIENT_CLINIC_OR_DEPARTMENT_OTHER): Payer: Medicare Other

## 2021-01-14 ENCOUNTER — Emergency Department (HOSPITAL_BASED_OUTPATIENT_CLINIC_OR_DEPARTMENT_OTHER): Payer: Medicare Other | Admitting: Radiology

## 2021-01-14 ENCOUNTER — Other Ambulatory Visit: Payer: Self-pay

## 2021-01-14 ENCOUNTER — Other Ambulatory Visit: Payer: Self-pay | Admitting: Family Medicine

## 2021-01-14 ENCOUNTER — Other Ambulatory Visit: Payer: Self-pay | Admitting: Internal Medicine

## 2021-01-14 DIAGNOSIS — S39012A Strain of muscle, fascia and tendon of lower back, initial encounter: Secondary | ICD-10-CM | POA: Diagnosis not present

## 2021-01-14 DIAGNOSIS — M545 Low back pain, unspecified: Secondary | ICD-10-CM | POA: Diagnosis not present

## 2021-01-14 DIAGNOSIS — Z96651 Presence of right artificial knee joint: Secondary | ICD-10-CM | POA: Insufficient documentation

## 2021-01-14 DIAGNOSIS — Z85828 Personal history of other malignant neoplasm of skin: Secondary | ICD-10-CM | POA: Diagnosis not present

## 2021-01-14 DIAGNOSIS — M5412 Radiculopathy, cervical region: Secondary | ICD-10-CM | POA: Insufficient documentation

## 2021-01-14 DIAGNOSIS — Z7982 Long term (current) use of aspirin: Secondary | ICD-10-CM | POA: Insufficient documentation

## 2021-01-14 DIAGNOSIS — S3992XA Unspecified injury of lower back, initial encounter: Secondary | ICD-10-CM | POA: Diagnosis present

## 2021-01-14 DIAGNOSIS — Z043 Encounter for examination and observation following other accident: Secondary | ICD-10-CM | POA: Diagnosis not present

## 2021-01-14 DIAGNOSIS — I129 Hypertensive chronic kidney disease with stage 1 through stage 4 chronic kidney disease, or unspecified chronic kidney disease: Secondary | ICD-10-CM | POA: Diagnosis not present

## 2021-01-14 DIAGNOSIS — N182 Chronic kidney disease, stage 2 (mild): Secondary | ICD-10-CM | POA: Insufficient documentation

## 2021-01-14 DIAGNOSIS — F0781 Postconcussional syndrome: Secondary | ICD-10-CM | POA: Diagnosis not present

## 2021-01-14 DIAGNOSIS — Z7951 Long term (current) use of inhaled steroids: Secondary | ICD-10-CM | POA: Insufficient documentation

## 2021-01-14 DIAGNOSIS — W182XXA Fall in (into) shower or empty bathtub, initial encounter: Secondary | ICD-10-CM | POA: Diagnosis not present

## 2021-01-14 DIAGNOSIS — Z87891 Personal history of nicotine dependence: Secondary | ICD-10-CM | POA: Insufficient documentation

## 2021-01-14 DIAGNOSIS — Z79899 Other long term (current) drug therapy: Secondary | ICD-10-CM | POA: Diagnosis not present

## 2021-01-14 DIAGNOSIS — Z955 Presence of coronary angioplasty implant and graft: Secondary | ICD-10-CM | POA: Insufficient documentation

## 2021-01-14 DIAGNOSIS — J449 Chronic obstructive pulmonary disease, unspecified: Secondary | ICD-10-CM | POA: Diagnosis not present

## 2021-01-14 DIAGNOSIS — K219 Gastro-esophageal reflux disease without esophagitis: Secondary | ICD-10-CM

## 2021-01-14 MED ORDER — PREDNISONE 10 MG PO TABS: 40.0000 mg | ORAL_TABLET | Freq: Every day | ORAL | 0 refills | Status: DC

## 2021-01-14 MED ORDER — HYDROCODONE-ACETAMINOPHEN 5-325 MG PO TABS: 1.0000 | ORAL_TABLET | Freq: Four times a day (QID) | ORAL | 0 refills | Status: DC | PRN

## 2021-01-14 NOTE — ED Triage Notes (Signed)
Pt fell a couple of weeks ago in the show, numbness in left arm and fingers with some altered thought process, Pt admits to hitting head in shower, she has had several neck surgeries in past. Slightly less sensation in left arm during assessment. C/o knot post left shoulder today, comes and goes.

## 2021-01-14 NOTE — ED Provider Notes (Addendum)
[facility] Provider Note   CSN: [id number] Arrival date & time: 01/14/21  1244     History Chief Complaint  Patient presents with   Numbness   Altered Mental Status   Fall    Erin Lopez is a 70 y.o. female.  Patient with a fall in the shower 3 weeks ago.  Slipped on conditioner.  And fell down slamming her back against the sidewall and then she went down to the floor.  No loss of consciousness.  The patient states that she has had some memory fog since then.  Complaint of left arm pain and left lateral neck pain and some numbness to her little finger and ring finger on the left side.  No problems on the right side.  No problems with the lower extremities.  Also patient with low back pain following the fall.  She in the past has had a cervical fusion by Dr. Vertell Limber neurosurgery.  Patient's had trouble with narcotics in the past.  But says she does okay on hydrocodone as long as just a few pills.  Patient states that she has pain in her left shoulder but she got good range of motion of the shoulder.       Past Medical History:  Diagnosis Date   Alcoholism in remission [facility])    states last alcohol 11 yrs ago   Anemia    Anxiety    Bipolar 1 disorder ([place])    Admission for mania x 2 (most recent 11/2017)   Cervical spondylosis without myelopathy 12/19/2013   MRI 02/2014: multilevel DDD/spondylosis, not much change compared to prior MRI.  Pt not in favor of invasive therapy for her neck as of 04/2014.   Cholelithiasis    COPD (chronic obstructive pulmonary disease) (HCC)    Moderate: anoro started 04/2017 by pulm, pt symptomatically improved and PFTs stable at f/u 06/2017.   Coronary artery spasm ([place]) 07/11/2018   nitrates=HA. Amlodipine=intol swelling.  Changed to coreg 09/03/18 by cardiologist.   Depression    Dilutional hyponatremia    Endometritis 05/2017   possible; empiric tx with Flagyl by GYN.   Environmental allergies    Fibromyalgia     GERD (gastroesophageal reflux disease)    Hepatic steatosis 03/2019   u/s abd   Herpes zoster 08/07/2017   L scapular region   Hiatal hernia    History of adenomatous polyp of colon 04/2008; 08/2014   No high grade dysplasia: recall 5 yrs ([name], [name])   History of basal cell carcinoma excision    NOSE   History of Helicobacter pylori infection 04/2008   +gastric biopsy (gastritis but no metaplasia, dysplasia, or malignancy identified)   History of kidney stones    History of TIA (transient ischemic attack)    secondary to HELLP syndrome 1994 post c/s--  residual memory loss   Hyperlipidemia    statin started after her NSTEMI: goal  LDL <70.   Hypertension    Internal hemorrhoids    Ischemic cardiomyopathy 05/2018   Echo EF 45-50% in context of NSTEMI from CA spasm. 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 04/2017.   Leg pain    ABIs 11/2019: normal    Memory loss    Abnl MRI brain and  CT brain c/w chronic microvascular ischemia.  Repeat MRI brain 02/2014 stable (Dr. Mayra Reel with no plan to f/u with neuro as of 04/2014)   NSTEMI (non-ST elevated myocardial infarction) ([place]) 05/2018   Echo EF 45-50% in context of NSTEMI from CA spasm.// Myoview 8/21: EF 66, no ischemia, low risk   Osteoporosis 05/2010   2012 'penia; 06/2015 'porosis:  prolia.  DEXA 09/2017 T-score -2.2 femur neck.  As of 11/2018 we're restarting prolia soon and will check next DEXA 1 yr from her next prolia injection.   Pelvic floor dysfunction    [facility] ([name])   Pneumonia    Postmenopausal vaginal bleeding 05/2017   x 1 small episode; GYN attempted endo bx but unable to penetrate cervix due to severe cerv stenosis (due to postmenopausal state + hx  of LEEP).  Endo u/s showed thin endo lining.  Per GYN---no evidence of endomet pathology on w/u---obs as of 07/2017.   Prediabetes 06/2016   Fasting gluc 105; HbA1c at that time was 6.0%.  A1c 6.2% 12/2017.   Recurrent kidney stones 08/2013   Left ureteral calculus: Perc nephr--cystoscopy w/ureteroscopy + laser for stone removal.  Residual asymptomatic left renal nephrolithiasis <76mm present post-procedure.  Right sided hydronephrosis-persistent (on u/s)--urol ordered CT to further eval 08/2016: 1.6 cm nonobstructing stone lower pole left kidney, no hydro, plan for PCN extraction [facility])   Shortness of breath    Sprain of neck 12/19/2013   Stroke (HCC)    TIA   TMJ (temporomandibular joint disorder)    USES  MOUTH GUARD   Toe fracture, left 12/2017   2nd toe prox phalanx (immobilization, [name])   Tubular adenoma of colon    Vitamin D deficiency 05/2011   Weak urinary stream 07/2016   with elevated PVR and mild left hydronephrosis secondary to this ([facility] started flomax 0.4mg  qd for this).    Patient Active Problem List   Diagnosis Date Noted   Shortness of breath 04/19/2019   Healthcare maintenance 04/19/2019   Overweight (BMI 25.0-29.9) 02/19/2019   Ischemic cardiomyopathy 09/03/2018   Chest pain 07/11/2018   Coronary vasospasm ([place]) 07/11/2018   NSTEMI (non-ST elevated myocardial infarction) ([place]) 05/14/2018   Acute encephalopathy 11/19/2017   Hyponatremia 11/16/2017   Bipolar 1 disorder ([place]) 12/29/2016   Hypertension 12/29/2016   TIA (transient ischemic attack) 12/29/2016   Chronic pelvic pain in female 08/22/2016   Stage 2 chronic kidney disease 08/22/2016   Osteoporosis 09/30/2015   Health maintenance examination 06/11/2015   Suprapubic discomfort 06/05/2015   Urethral caruncle 06/05/2015   Infection of urinary tract 06/05/2015   TMJ (temporomandibular joint disorder) 02/25/2015   Right ankle injury 11/05/2014   Cervical spondylosis without myelopathy  12/19/2013   Sprain of neck 12/19/2013   Memory difficulties 12/19/2013   Postnasal drip 10/28/2013   Renal calculus 08/05/2013   Urinary frequency 08/01/2013   Constipation, chronic 07/29/2013   COPD (chronic obstructive pulmonary disease) ([place]) 06/30/2013   Abnormal chest x-ray 06/30/2013   Benign essential HTN 05/29/2013   Kidney stone on left side 05/29/2013   Hyperlipidemia 05/29/2013    Past Surgical History:  Procedure Laterality Date   ABIs  11/29/2019   NORMAL   ANTERIOR CERVICAL DECOMP/DISCECTOMY FUSION  06/30/2009   C3 -- C5  AND EXPLORATION OF FUSION C5-7 W/  PLATE REMOVAL   BACK SURGERY     BUNIONECTOMY Left    CARDIOVASCULAR STRESS TEST  12/03/2019   myoc perf im: NORMAL   CERVICAL  FUSION  2002   anterior C5 -- C7   CERVICAL SPINE SURGERY  08/27/1999     C6 -- T1  LAMINECTOMY/  DISKECTOMY   CESAREAN SECTION  1994   CHOLECYSTECTOMY N/A 03/06/2020   Procedure: LAPAROSCOPIC CHOLECYSTECTOMY;  Surgeon: Felicie Morn, MD;  Location: [facility];  Service: General;  Laterality: N/A;   COLONOSCOPY W/ POLYPECTOMY  04/2008;08/2014   2016 tubular adenoma x 1; +diverticulosis and int/ext hemorrhoids.  06/2020 adenoma, recall 5 yrs.   CYSTOSCOPY WITH URETEROSCOPY AND STENT PLACEMENT Left 08/28/2013   Procedure: CYSTOSCOPY, RGP,  WITH URETEROSCOPY AND STENT REMOVAL;  Surgeon: [name], MD;  Location: [facility];  Service: Urology;  Laterality: Left;   DEXA  09/2017   T-score -2.2 femur neck: improved compared to 06/2015.   ESOPHAGOGASTRODUODENOSCOPY  2010; 08/2014   2016 +Candidal esophagitis; Mild chronic gastritis w/intestinal metaplasia--NEG H pylori, neg for eosinophilic esoph. 0/2542 esoph candidiasis (diflucan x 20d), o/w normal.   HOLMIUM LASER APPLICATION Left 70/62/3762   Procedure: HOLMIUM LASER APPLICATION;  Surgeon: [name], MD;  Location: [facility];  Service: Urology;  Laterality: Left;   JOINT REPLACEMENT     LEFT HEART  CATH AND CORONARY ANGIOGRAPHY N/A 05/15/2018   Evidence for spasm in distal LAD.  Mod RCA atherosclerosis, o/w no CAD.  Procedure: LEFT HEART CATH AND CORONARY ANGIOGRAPHY;  Surgeon: Leonie Man, MD;  Location: [facility];  Service: Cardiovascular;  Laterality: N/A;   NEGATIVE SLEEP STUDY  04/01/2007   NEPHROLITHOTOMY Left 08/05/2013   Procedure: NEPHROLITHOTOMY PERCUTANEOUS;  Surgeon: [name], MD;  Location: [facility];  Service: Urology;  Laterality: Left;   POLYSOMNOGRAM  01/2019   NORMAL   POLYSOMNOGRAPHY  10/25/2018   Dr. Dion Saucier due to pt inadequat sleep time (83 min).   TONSILLECTOMY AND ADENOIDECTOMY  1975   TOTAL KNEE ARTHROPLASTY Right 2003   TRANSTHORACIC ECHOCARDIOGRAM  05/2018; 12/03/2019   (after MI secondary to arterial spasm): EF 45-50%; severe hypokinesis of apical anterolateral, inferolateral , anterior, and inferior LV myocardium. 11/2019 EF 55-60%, valves fine   ULTRASOUND EXAM,PELVIC COMPLETE ([place] [facility])  06/25/2015   NORMAL (done by GYN)     OB History     Gravida  3   Para  1   Term      Preterm      AB      Living  1      SAB      IAB      Ectopic      Multiple      Live Births              Family History  Problem Relation Age of Onset   Alcohol abuse Mother    Arthritis Mother    Hypertension Mother    Hyperlipidemia Mother    Diabetes Mother    Parkinsonism Mother    Breast cancer Mother    Prostate cancer Father    Hypertension Father    Hyperlipidemia Father    Diabetes Father    Heart disease Father    Esophageal cancer Sister    Stomach cancer Sister    Colon polyps Sister    Colon polyps Brother    Heart disease Brother        x 2   Irritable bowel syndrome Sister    Colon cancer Neg Hx     Social History   Tobacco Use   Smoking status:  Former    Packs/day: 1.00    Years: 33.00    Pack years: 33.00    Types: Cigarettes    Quit date: 08/22/1993    Years since quitting: 27.4    Smokeless tobacco: Never  Vaping Use   Vaping Use: Never used  Substance Use Topics   Alcohol use: No    Alcohol/week: 0.0 standard drinks    Comment: ALCOHOLIC IN REMISSION SINCE  2004   Drug use: No    Home Medications Prior to Admission medications   Medication Sig Start Date End Date Taking? Authorizing Provider  acetaminophen (TYLENOL) 500 MG tablet Take 1,000 mg by mouth every 6 (six) hours as needed for mild pain or headache.    Yes [provider]  ALPRAZolam (XANAX) 0.5 MG tablet TAKE 1 TABLET BY MOUTH THREE TIMES A DAY AS NEEDED FOR ANXIETY 09/22/20  Yes McGowen, Adrian Blackwater, MD  aspirin EC 81 MG EC tablet Take 1 tablet (81 mg total) by mouth daily. 05/17/18  Yes Duke, Tami Lin, PA  atorvastatin (LIPITOR) 80 MG tablet TAKE 1 TABLET BY MOUTH DAILY AT 6 PM. 10/29/20  Yes McGowen, Adrian Blackwater, MD  citalopram (CELEXA) 20 MG tablet Take 1 tablet (20 mg total) by mouth in the morning and at bedtime. 10/29/20  Yes McGowen, Adrian Blackwater, MD  cyclobenzaprine (FLEXERIL) 10 MG tablet TAKE 1 TABLET BY MOUTH TWICE A DAY 12/31/20  Yes McGowen, Adrian Blackwater, MD  doxycycline (VIBRAMYCIN) 50 MG capsule Take 50 mg by mouth daily. 01/09/20  Yes [provider]  fluticasone (FLONASE) 50 MCG/ACT nasal spray SPRAY 2 SPRAYS INTO EACH NOSTRIL EVERY DAY 06/15/20  Yes McGowen, Adrian Blackwater, MD  hydrochlorothiazide (HYDRODIURIL) 12.5 MG tablet TAKE 1 TABLET BY MOUTH EVERY DAY 12/30/20  Yes Turner, Eber Hong, MD  lamoTRIgine (LAMICTAL) 100 MG tablet TAKE 3 TABLETS (300 MG TOTAL) BY MOUTH AT BEDTIME. 11/26/20  Yes McGowen, Adrian Blackwater, MD  losartan (COZAAR) 25 MG tablet TAKE 1 TABLET BY MOUTH EVERY DAY 04/06/20  Yes Richardson Dopp T, PA-C  Magnesium 250 MG TABS Take 250 mg by mouth daily.    Yes [provider]  metoprolol succinate (TOPROL-XL) 100 MG 24 hr tablet Take 1 tablet (100 mg total) by mouth daily. Take with or immediately following a meal. 12/30/20  Yes Turner, Traci R, MD  montelukast (SINGULAIR)  10 MG tablet TAKE 1 TABLET BY MOUTH EVERY DAY IN THE EVENING 12/31/20  Yes McGowen, Adrian Blackwater, MD  Multiple Vitamin (MULTI-VITAMINS) TABS Take 1 tablet by mouth daily.    Yes [provider]  pantoprazole (PROTONIX) 40 MG tablet TAKE 1 TABLET BY MOUTH EVERY DAY 08/17/20  Yes Pyrtle, Lajuan Lines, MD  polyethylene glycol powder (GLYCOLAX/MIRALAX) powder TAKE 17 G BY MOUTH DAILY. 06/19/17  Yes McGowen, Adrian Blackwater, MD  potassium citrate (UROCIT-K) 10 MEQ (1080 MG) SR tablet Take 10 mEq by mouth 2 (two) times a day. 08/05/18  Yes [provider]  predniSONE (DELTASONE) 10 MG tablet Take 4 tablets (40 mg total) by mouth daily. 01/14/21  Yes Fredia Sorrow, MD  Propylene Glycol 0.6 % SOLN Apply 1 drop to eye 2 (two) times daily.   Yes [provider]  tamsulosin (FLOMAX) 0.4 MG CAPS capsule Take 0.4 mg by mouth daily. 08/16/20  Yes [provider]  Tiotropium Bromide-Olodaterol (STIOLTO RESPIMAT) 2.5-2.5 MCG/ACT AERS Inhale 2 puffs into the lungs daily. 12/09/20  Yes Mannam, Praveen, MD  zolpidem (AMBIEN) 10 MG tablet TAKE 1 TABLET  BY MOUTH EVERYDAY AT BEDTIME 01/05/21  Yes McGowen, Adrian Blackwater, MD  albuterol (VENTOLIN HFA) 108 (90 Base) MCG/ACT inhaler INHALE 2 PUFFS INTO THE LUNGS EVERY 4 HOURS AS NEEDED FOR WHEEZE OR FOR SHORTNESS OF BREATH 07/09/20   McGowen, Adrian Blackwater, MD  famotidine (PEPCID) 40 MG tablet Take 1 tablet (40 mg total) by mouth daily. Patient not taking: No sig reported 10/29/20   McGowen, Adrian Blackwater, MD  fluconazole (DIFLUCAN) 100 MG tablet Take 1 tablet (100 mg total) by mouth daily. Patient not taking: No sig reported 07/01/20   Pyrtle, Lajuan Lines, MD  fluconazole (DIFLUCAN) 100 MG tablet Take 200mg  x's 1 day, then 100mg  x's 7 days Patient not taking: No sig reported 12/09/20   Marshell Garfinkel, MD  HYDROcodone-acetaminophen (NORCO/VICODIN) 5-325 MG tablet Take 1 tablet by mouth every 6 (six) hours as needed for moderate pain. 01/14/21  Yes Fredia Sorrow, MD   ipratropium-albuterol (DUONEB) 0.5-2.5 (3) MG/3ML SOLN TAKE 3 MLS BY NEBULIZATION 3 (THREE) TIMES DAILY AS NEEDED. 07/13/20   Mannam, Hart Robinsons, MD  nitroGLYCERIN (NITROSTAT) 0.4 MG SL tablet Place 1 tablet (0.4 mg total) under the tongue every 5 (five) minutes x 3 doses as needed for chest pain. 05/16/18   Duke, Tami Lin, PA  Tiotropium Bromide-Olodaterol (STIOLTO RESPIMAT) 2.5-2.5 MCG/ACT AERS Inhale 2 puffs into the lungs daily. 12/09/20   Mannam, Hart Robinsons, MD  triamcinolone cream (KENALOG) 0.1 % Apply 1 application topically daily. 05/21/18   [provider]  umeclidinium-vilanterol (ANORO ELLIPTA) 62.5-25 MCG/INH AEPB Inhale 1 puff into the lungs daily. Patient not taking: Reported on 01/14/2021 05/21/20   Marshell Garfinkel, MD    Allergies    Ceftin [cefuroxime axetil], Ciprofloxacin, Fosamax [alendronate sodium], Morphine and related, Oxycodone, Prednisone, and Seroquel [quetiapine]  Review of Systems   Review of Systems  Constitutional:  Negative for chills and fever.  HENT:  Negative for ear pain and sore throat.   Eyes:  Negative for pain and visual disturbance.  Respiratory:  Negative for cough and shortness of breath.   Cardiovascular:  Negative for chest pain and palpitations.  Gastrointestinal:  Negative for abdominal pain and vomiting.  Genitourinary:  Negative for dysuria and hematuria.  Musculoskeletal:  Positive for back pain and neck pain. Negative for arthralgias.  Skin:  Negative for color change and rash.  Neurological:  Positive for numbness. Negative for seizures and syncope.  Psychiatric/Behavioral:  Positive for confusion.   All other systems reviewed and are negative.  Physical Exam Updated Vital Signs BP (!) 163/96 (BP Location: Right Arm)   Pulse 71   Temp 98.2 F (36.8 C)   Resp 14   Ht 1.702 m (5\' 7" )   Wt 81.6 kg   SpO2 98%   BMI 28.19 kg/m   Physical Exam Vitals and nursing note reviewed.  Constitutional:      General: She is not in  acute distress.    Appearance: Normal appearance. She is well-developed.  HENT:     Head: Normocephalic and atraumatic.  Eyes:     Extraocular Movements: Extraocular movements intact.     Conjunctiva/sclera: Conjunctivae normal.     Pupils: Pupils are equal, round, and reactive to light.  Neck:     Comments: No posterior neck tenderness.  Has some discomfort at the base of the neck left laterally. Cardiovascular:     Rate and Rhythm: Normal rate and regular rhythm.     Heart sounds: No murmur heard. Pulmonary:     Effort: Pulmonary effort  is normal. No respiratory distress.     Breath sounds: Normal breath sounds.  Abdominal:     Palpations: Abdomen is soft.     Tenderness: There is no abdominal tenderness.  Musculoskeletal:     Cervical back: Normal range of motion and neck supple. No tenderness.     Comments: Tenderness to palpation to the lumbar spine.  No paraspinous tenderness.  Patient with good range of motion of the wrist elbow and shoulder of the left arm.  Good strength in the fingers.  But there is seems to be numbness to the little finger and ring finger patient has a scar to the left forearm that is well-healed.  This is where a horse bit her in the past.  Skin:    General: Skin is warm and dry.     Capillary Refill: Capillary refill takes less than 2 seconds.  Neurological:     Mental Status: She is alert and oriented to person, place, and time.     Cranial Nerves: No cranial nerve deficit.     Sensory: Sensory deficit present.     Motor: No weakness.    ED Results / Procedures / Treatments   Labs (all labs ordered are listed, but only abnormal results are displayed) Labs Reviewed - No data to display  EKG None  Radiology CT Head Wo Contrast  Result Date: 01/14/2021 CLINICAL DATA:  Fall in the shower a couple weeks ago, left arm numbness, altered mental status EXAM: CT HEAD WITHOUT CONTRAST CT CERVICAL SPINE WITHOUT CONTRAST TECHNIQUE: Multidetector CT  imaging of the head and cervical spine was performed following the standard protocol without intravenous contrast. Multiplanar CT image reconstructions of the cervical spine were also generated. COMPARISON:  CT cervical spine, 11/16/2017 FINDINGS: CT HEAD FINDINGS Brain: No evidence of acute infarction, hemorrhage, hydrocephalus, extra-axial collection or mass lesion/mass effect. Mild periventricular and deep white matter hypodensity. Vascular: No hyperdense vessel or unexpected calcification. Skull: Normal. Negative for fracture or focal lesion. Sinuses/Orbits: No acute finding. Other: None. CT CERVICAL SPINE FINDINGS Alignment: There is new, approximately 5 mm anterolisthesis of C2 on C3 with associated endplate degenerative change in sclerosis. Skull base and vertebrae: No acute fracture. No primary bone lesion or focal pathologic process. Soft tissues and spinal canal: No prevertebral fluid or swelling. No visible canal hematoma. Disc levels: Anterior cervical discectomy and fusion of C3 through C5, with additional fusion of C6-C7. Upper chest: Negative. Other: None. IMPRESSION: 1. No acute intracranial pathology. Small-vessel white matter disease. 2. No fracture of the cervical spine. 3. There is approximately 5 mm anterolisthesis of C2 on C3 with associated endplate degenerative change and sclerosis, which although likely chronic and degenerative in nature, is new compared to prior CT dated 11/16/2017. 4. Anterior cervical discectomy and fusion of C3 through C5, with additional fusion of C6-C7. Electronically Signed   By: Delanna Ahmadi M.D.   On: 01/14/2021 14:00   CT Cervical Spine Wo Contrast  Result Date: 01/14/2021 CLINICAL DATA:  Fall in the shower a couple weeks ago, left arm numbness, altered mental status EXAM: CT HEAD WITHOUT CONTRAST CT CERVICAL SPINE WITHOUT CONTRAST TECHNIQUE: Multidetector CT imaging of the head and cervical spine was performed following the standard protocol without  intravenous contrast. Multiplanar CT image reconstructions of the cervical spine were also generated. COMPARISON:  CT cervical spine, 11/16/2017 FINDINGS: CT HEAD FINDINGS Brain: No evidence of acute infarction, hemorrhage, hydrocephalus, extra-axial collection or mass lesion/mass effect. Mild periventricular and deep white matter hypodensity.  Vascular: No hyperdense vessel or unexpected calcification. Skull: Normal. Negative for fracture or focal lesion. Sinuses/Orbits: No acute finding. Other: None. CT CERVICAL SPINE FINDINGS Alignment: There is new, approximately 5 mm anterolisthesis of C2 on C3 with associated endplate degenerative change in sclerosis. Skull base and vertebrae: No acute fracture. No primary bone lesion or focal pathologic process. Soft tissues and spinal canal: No prevertebral fluid or swelling. No visible canal hematoma. Disc levels: Anterior cervical discectomy and fusion of C3 through C5, with additional fusion of C6-C7. Upper chest: Negative. Other: None. IMPRESSION: 1. No acute intracranial pathology. Small-vessel white matter disease. 2. No fracture of the cervical spine. 3. There is approximately 5 mm anterolisthesis of C2 on C3 with associated endplate degenerative change and sclerosis, which although likely chronic and degenerative in nature, is new compared to prior CT dated 11/16/2017. 4. Anterior cervical discectomy and fusion of C3 through C5, with additional fusion of C6-C7. Electronically Signed   By: Delanna Ahmadi M.D.   On: 01/14/2021 14:00    Procedures Procedures   Medications Ordered in ED Medications - No data to display  ED Course  I have reviewed the triage vital signs and the nursing notes.  Pertinent labs & imaging results that were available during my care of the patient were reviewed by me and considered in my medical decision making (see chart for details).    MDM Rules/Calculators/A&P                           CT head without any acute findings.  CT  cervical spine without evidence of any acute abnormalities.  However there are several changes.  At C2-C3 there is an anterior listhesis.  There is anterior cervical discectomy and fusion of C3-C5.  An additional fusion of C6-C7.  These were all done by Dr. Vertell Limber neurosurgery.  She does appear to have a radiculopathy with some numbness to the left little finger and ring finger.  She may also have a postconcussive syndrome since she feels there is some memory issues.  But head CT without any significant findings.  X-rays of the lumbar spine are pending.  Maxie Better is to treat with a course of prednisone although patient may not want to do that.  Patient did agree to a short course of hydrocodone just like 5 tablets.  They have been sent to her pharmacy.  Patient will follow-up with Dr. Vertell Limber from neurosurgery.  Not feel that there is any evidence of a left rotator cuff injury.  There is obviously no deformity at the shoulder.  Good range of motion of the shoulder.  Patient able to place her hand on top of her head.  Without any difficulty.  X-rays of the lumbar spine without any acute injury.  But does have some chronic changes.   Final Clinical Impression(s) / ED Diagnoses Final diagnoses:  Cervical radiculopathy  Strain of lumbar region, initial encounter  Post concussive syndrome    Rx / DC Orders ED Discharge Orders          Ordered    HYDROcodone-acetaminophen (NORCO/VICODIN) 5-325 MG tablet  Every 6 hours PRN        01/14/21 1516    predniSONE (DELTASONE) 10 MG tablet  Daily        01/14/21 1517             Fredia Sorrow, MD 01/14/21 1522    Fredia Sorrow, MD 01/14/21 1541

## 2021-01-14 NOTE — Discharge Instructions (Addendum)
Prednisone as directed.  Take an appointment follow-up with Dr. Vertell Limber.  CT head and neck without any acute findings.  But you got some radicular symptoms to your left arm from a pinched nerve in the neck.  Also you may have a postconcussive syndrome due to some of the memory issues.  No acute findings on head CT.   Take the hydrocodone as needed for additional pain relief.  Records provided.  Neurosurgery a call for follow-up.  Erin Lopez you do not want to take the prednisone but the prescriptions there if you want to get it filled.

## 2021-01-14 NOTE — ED Notes (Signed)
ED Provider at bedside. 

## 2021-01-18 ENCOUNTER — Encounter: Payer: Self-pay | Admitting: Family Medicine

## 2021-01-18 DIAGNOSIS — G8929 Other chronic pain: Secondary | ICD-10-CM

## 2021-01-18 DIAGNOSIS — M542 Cervicalgia: Secondary | ICD-10-CM

## 2021-01-18 DIAGNOSIS — M4722 Other spondylosis with radiculopathy, cervical region: Secondary | ICD-10-CM

## 2021-01-18 NOTE — Telephone Encounter (Signed)
OK, referral ordered 

## 2021-01-28 DIAGNOSIS — M4312 Spondylolisthesis, cervical region: Secondary | ICD-10-CM | POA: Diagnosis not present

## 2021-02-10 DIAGNOSIS — M4312 Spondylolisthesis, cervical region: Secondary | ICD-10-CM | POA: Diagnosis not present

## 2021-02-10 DIAGNOSIS — M542 Cervicalgia: Secondary | ICD-10-CM | POA: Diagnosis not present

## 2021-02-21 ENCOUNTER — Other Ambulatory Visit: Payer: Self-pay | Admitting: Physician Assistant

## 2021-02-21 ENCOUNTER — Other Ambulatory Visit: Payer: Self-pay | Admitting: Family Medicine

## 2021-02-21 ENCOUNTER — Other Ambulatory Visit: Payer: Self-pay | Admitting: Internal Medicine

## 2021-02-22 ENCOUNTER — Telehealth: Payer: Self-pay | Admitting: Family Medicine

## 2021-02-22 DIAGNOSIS — R030 Elevated blood-pressure reading, without diagnosis of hypertension: Secondary | ICD-10-CM | POA: Diagnosis not present

## 2021-02-22 DIAGNOSIS — M4312 Spondylolisthesis, cervical region: Secondary | ICD-10-CM | POA: Diagnosis not present

## 2021-02-22 NOTE — Telephone Encounter (Signed)
Left message for patient to schedule Annual Wellness Visit.  Please schedule with Nurse Health Advisor Charlott Rakes, RN at [facility]. Please all [phone] ask for [facility]

## 2021-03-24 ENCOUNTER — Other Ambulatory Visit: Payer: Self-pay | Admitting: Family Medicine

## 2021-03-25 NOTE — Telephone Encounter (Signed)
Requesting: alprazolam Contract: 03/30/20 UDS: 10/29/20 Last Visit: 10/29/20 Next Visit: 04/30/21 Last Refill: 09/22/20(90,5)  Please Advise. Medication pending

## 2021-04-09 ENCOUNTER — Encounter: Payer: Self-pay | Admitting: Family Medicine

## 2021-04-09 ENCOUNTER — Other Ambulatory Visit: Payer: Self-pay

## 2021-04-09 ENCOUNTER — Ambulatory Visit (INDEPENDENT_AMBULATORY_CARE_PROVIDER_SITE_OTHER): Payer: Medicare Other | Admitting: Family Medicine

## 2021-04-09 VITALS — BP 115/74 | HR 80 | Temp 99.3°F | Ht 67.0 in | Wt 188.4 lb

## 2021-04-09 DIAGNOSIS — J019 Acute sinusitis, unspecified: Secondary | ICD-10-CM | POA: Diagnosis not present

## 2021-04-09 MED ORDER — AZITHROMYCIN 250 MG PO TABS: ORAL_TABLET | ORAL | 0 refills | Status: DC

## 2021-04-09 NOTE — Progress Notes (Addendum)
OFFICE VISIT  04/09/2021  CC:  Chief Complaint  Patient presents with   Sinus congestion    X 1week, productive cough w/ green phlegm. Has used otc Guaifenesin expectorant , tylenol, nebulizer machine   HPI:    Patient is a 71 y.o. female who presents for sinus congestion.  HPI: Onset about 1 week ago, nasal congestion/sinus pressure, postnasal drip.  Some coughing and feeling of some wheezing but once daily use of DuoNeb helps this well.  No shortness of breath, no fever, no chest pain.  No body aches.  Has been taking over-the-counter expectorant, antihistamine, and takes Flonase daily. Home COVID test negative this morning. She takes stiolto respimat daily--says this is helped very well for her.  Past Medical History:  Diagnosis Date   Alcoholism in remission [facility])    states last alcohol 11 yrs ago   Anemia    Anxiety    Bipolar 1 disorder ([place])    Admission for mania x 2 (most recent 11/2017)   Cervical spondylosis without myelopathy 12/19/2013   MRI 02/2014: multilevel DDD/spondylosis, not much change compared to prior MRI.  Pt not in favor of invasive therapy for her neck as of 04/2014.   Cholelithiasis    COPD (chronic obstructive pulmonary disease) (HCC)    Moderate: anoro started 04/2017 by pulm, pt symptomatically improved and PFTs stable at f/u 06/2017.   Coronary artery spasm ([place]) 07/11/2018   nitrates=HA. Amlodipine=intol swelling.  Changed to coreg 09/03/18 by cardiologist.   Depression    Dilutional hyponatremia    Endometritis 05/2017   possible; empiric tx with Flagyl by GYN.   Environmental allergies    Fibromyalgia    GERD (gastroesophageal reflux disease)    Hepatic steatosis 03/2019   u/s abd   Herpes zoster 08/07/2017   L scapular region   Hiatal hernia    History of adenomatous polyp of colon 04/2008; 08/2014   No high grade dysplasia: recall 5 yrs ([name], [name])   History of basal cell carcinoma excision    NOSE   History of Helicobacter  pylori infection 04/2008   +gastric biopsy (gastritis but no metaplasia, dysplasia, or malignancy identified)   History of kidney stones    History of TIA (transient ischemic attack)    secondary to HELLP syndrome 1994 post c/s--  residual memory loss   Hyperlipidemia    statin started after her NSTEMI: goal  LDL <70.   Hypertension    Internal hemorrhoids    Ischemic cardiomyopathy 05/2018   Echo EF 45-50% in context of NSTEMI from CA spasm. 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 04/2017.   Leg pain    ABIs 11/2019: normal    Memory loss    Abnl MRI brain and CT brain c/w chronic microvascular ischemia.  Repeat MRI brain 02/2014 stable (Dr. Mayra Reel with no plan to f/u with neuro as of 04/2014)   NSTEMI (non-ST elevated myocardial infarction) ([place]) 05/2018   Echo EF 45-50% in context of NSTEMI from CA spasm.// Myoview 8/21: EF 66, no ischemia, low risk   Osteoporosis 05/2010   2012 'penia; 06/2015 'porosis:  prolia.  DEXA 09/2017 T-score -2.2 femur neck.  As of 11/2018 we're restarting prolia soon and will check next DEXA 1 yr from her  next prolia injection.   Pelvic floor dysfunction    [facility] ([name])   Pneumonia    Postmenopausal vaginal bleeding 05/2017   x 1 small episode; GYN attempted endo bx but unable to penetrate cervix due to severe cerv stenosis (due to postmenopausal state + hx of LEEP).  Endo u/s showed thin endo lining.  Per GYN---no evidence of endomet pathology on w/u---obs as of 07/2017.   Prediabetes 06/2016   Fasting gluc 105; HbA1c at that time was 6.0%.  A1c 6.2% 12/2017.   Recurrent kidney stones 08/2013   Left ureteral calculus: Perc nephr--cystoscopy w/ureteroscopy + laser for stone removal.  Residual  asymptomatic left renal nephrolithiasis <61mm present post-procedure.  Right sided hydronephrosis-persistent (on u/s)--urol ordered CT to further eval 08/2016: 1.6 cm nonobstructing stone lower pole left kidney, no hydro, plan for PCN extraction [facility])   Shortness of breath    Sprain of neck 12/19/2013   Stroke (HCC)    TIA   TMJ (temporomandibular joint disorder)    USES  MOUTH GUARD   Toe fracture, left 12/2017   2nd toe prox phalanx (immobilization, [name])   Tubular adenoma of colon    Vitamin D deficiency 05/2011   Weak urinary stream 07/2016   with elevated PVR and mild left hydronephrosis secondary to this ([facility] started flomax 0.4mg  qd for this).    Past Surgical History:  Procedure Laterality Date   ABIs  11/29/2019   NORMAL   ANTERIOR CERVICAL DECOMP/DISCECTOMY FUSION  06/30/2009   C3 -- C5  AND EXPLORATION OF FUSION C5-7 W/  PLATE REMOVAL   BACK SURGERY     BUNIONECTOMY Left    CARDIOVASCULAR STRESS TEST  12/03/2019   myoc perf im: NORMAL   CERVICAL FUSION  2002   anterior C5 -- C7   CERVICAL SPINE SURGERY  08/27/1999     C6 -- T1  LAMINECTOMY/  DISKECTOMY   CESAREAN SECTION  1994   CHOLECYSTECTOMY N/A 03/06/2020   Procedure: LAPAROSCOPIC CHOLECYSTECTOMY;  Surgeon: Felicie Morn, MD;  Location: [place];  Service: General;  Laterality: N/A;   COLONOSCOPY W/ POLYPECTOMY  04/2008;08/2014   2016 tubular adenoma x 1; +diverticulosis and int/ext hemorrhoids.  06/2020 adenoma, recall 5 yrs.   CYSTOSCOPY WITH URETEROSCOPY AND STENT PLACEMENT Left 08/28/2013   Procedure: CYSTOSCOPY, RGP,  WITH URETEROSCOPY AND STENT REMOVAL;  Surgeon: [name], MD;  Location: [facility];  Service: Urology;  Laterality: Left;   DEXA  09/2017   T-score -2.2 femur neck: improved compared to 06/2015.   ESOPHAGOGASTRODUODENOSCOPY  2010; 08/2014   2016 +Candidal esophagitis; Mild chronic gastritis w/intestinal metaplasia--NEG H pylori, neg for eosinophilic esoph.  09/2692 esoph candidiasis (diflucan x 20d), o/w normal.   HOLMIUM LASER APPLICATION Left 85/46/2703   Procedure: HOLMIUM LASER APPLICATION;  Surgeon: [name], MD;  Location[facility];  Service: Urology;  Laterality: Left;   JOINT REPLACEMENT     LEFT HEART CATH AND CORONARY ANGIOGRAPHY N/A 05/15/2018   Evidence for spasm in distal LAD.  Mod RCA atherosclerosis, o/w no CAD.  Procedure: LEFT HEART CATH AND CORONARY ANGIOGRAPHY;  Surgeon: Leonie Man, MD;  Location: [place] [facility];  Service: Cardiovascular;  Laterality: N/A;   NEGATIVE SLEEP STUDY  04/01/2007   NEPHROLITHOTOMY Left 08/05/2013   Procedure: NEPHROLITHOTOMY PERCUTANEOUS;  Surgeon: [name], MD;  Location: [facility];  Service: Urology;  Laterality: Left;   POLYSOMNOGRAM  01/2019   NORMAL  POLYSOMNOGRAPHY  10/25/2018   Dr. Dion Saucier due to pt inadequat sleep time (83 min).   TONSILLECTOMY AND ADENOIDECTOMY  1975   TOTAL KNEE ARTHROPLASTY Right 2003   TRANSTHORACIC ECHOCARDIOGRAM  05/2018; 12/03/2019   (after MI secondary to arterial spasm): EF 45-50%; severe hypokinesis of apical anterolateral, inferolateral , anterior, and inferior LV myocardium. 11/2019 EF 55-60%, valves fine   ULTRASOUND EXAM,PELVIC COMPLETE ([facility] HX)  06/25/2015   NORMAL (done by GYN)    Outpatient Medications Prior to Visit  Medication Sig Dispense Refill   acetaminophen (TYLENOL) 500 MG tablet Take 1,000 mg by mouth every 6 (six) hours as needed for mild pain or headache.      albuterol (VENTOLIN HFA) 108 (90 Base) MCG/ACT inhaler INHALE 2 PUFFS INTO THE LUNGS EVERY 4 HOURS AS NEEDED FOR WHEEZE OR FOR SHORTNESS OF BREATH 8.5 each 2   ALPRAZolam (XANAX) 0.5 MG tablet TAKE 1 TABLET BY MOUTH THREE TIMES A DAY AS NEEDED FOR ANXIETY 90 tablet 5   aspirin EC 81 MG EC tablet Take 1 tablet (81 mg total) by mouth daily. 90 tablet 3   atorvastatin (LIPITOR) 80 MG tablet TAKE 1 TABLET BY MOUTH DAILY AT 6 PM. 90  tablet 3   citalopram (CELEXA) 20 MG tablet Take 1 tablet (20 mg total) by mouth in the morning and at bedtime. 180 tablet 3   cyclobenzaprine (FLEXERIL) 10 MG tablet TAKE 1 TABLET BY MOUTH TWICE A DAY 60 tablet 1   doxycycline (VIBRAMYCIN) 50 MG capsule Take 50 mg by mouth daily.     famotidine (PEPCID) 40 MG tablet Take 1 tablet (40 mg total) by mouth daily. 90 tablet 3   fluticasone (FLONASE) 50 MCG/ACT nasal spray SPRAY 2 SPRAYS INTO EACH NOSTRIL EVERY DAY 48 mL 1   hydrochlorothiazide (HYDRODIURIL) 12.5 MG tablet TAKE 1 TABLET BY MOUTH EVERY DAY 90 tablet 3   ipratropium-albuterol (DUONEB) 0.5-2.5 (3) MG/3ML SOLN TAKE 3 MLS BY NEBULIZATION 3 (THREE) TIMES DAILY AS NEEDED. 360 mL 3   lamoTRIgine (LAMICTAL) 100 MG tablet TAKE 3 TABLETS (300 MG TOTAL) BY MOUTH AT BEDTIME. 270 tablet 0   losartan (COZAAR) 25 MG tablet TAKE 1 TABLET BY MOUTH EVERY DAY 90 tablet 3   Magnesium 250 MG TABS Take 250 mg by mouth daily.      metoprolol succinate (TOPROL-XL) 100 MG 24 hr tablet Take 1 tablet (100 mg total) by mouth daily. Take with or immediately following a meal. 90 tablet 3   montelukast (SINGULAIR) 10 MG tablet TAKE 1 TABLET BY MOUTH EVERY DAY IN THE EVENING 90 tablet 1   Multiple Vitamin (MULTI-VITAMINS) TABS Take 1 tablet by mouth daily.      pantoprazole (PROTONIX) 40 MG tablet TAKE 1 TABLET BY MOUTH EVERY DAY 90 tablet 1   polyethylene glycol powder (GLYCOLAX/MIRALAX) powder TAKE 17 G BY MOUTH DAILY. 3162 g 0   potassium citrate (UROCIT-K) 10 MEQ (1080 MG) SR tablet Take 10 mEq by mouth 2 (two) times a day.     Propylene Glycol 0.6 % SOLN Apply 1 drop to eye 2 (two) times daily.     tamsulosin (FLOMAX) 0.4 MG CAPS capsule Take 0.4 mg by mouth daily.     Tiotropium Bromide-Olodaterol (STIOLTO RESPIMAT) 2.5-2.5 MCG/ACT AERS Inhale 2 puffs into the lungs daily. 4 g 5   Tiotropium Bromide-Olodaterol (STIOLTO RESPIMAT) 2.5-2.5 MCG/ACT AERS Inhale 2 puffs into the lungs daily. 4 g 0   triamcinolone  cream (KENALOG) 0.1 % Apply 1  application topically daily.     zolpidem (AMBIEN) 10 MG tablet TAKE 1 TABLET BY MOUTH EVERYDAY AT BEDTIME 90 tablet 1   predniSONE (DELTASONE) 10 MG tablet Take 4 tablets (40 mg total) by mouth daily. 20 tablet 0   nitroGLYCERIN (NITROSTAT) 0.4 MG SL tablet Place 1 tablet (0.4 mg total) under the tongue every 5 (five) minutes x 3 doses as needed for chest pain. (Patient not taking: Reported on 04/09/2021) 25 tablet 3   fluconazole (DIFLUCAN) 100 MG tablet Take 1 tablet (100 mg total) by mouth daily. (Patient not taking: No sig reported) 20 tablet 0   fluconazole (DIFLUCAN) 100 MG tablet Take 200mg  x's 1 day, then 100mg  x's 7 days (Patient not taking: No sig reported) 9 tablet 0   HYDROcodone-acetaminophen (NORCO/VICODIN) 5-325 MG tablet Take 1 tablet by mouth every 6 (six) hours as needed for moderate pain. (Patient not taking: Reported on 04/09/2021) 5 tablet 0   umeclidinium-vilanterol (ANORO ELLIPTA) 62.5-25 MCG/INH AEPB Inhale 1 puff into the lungs daily. (Patient not taking: Reported on 01/14/2021) 60 each 6   No facility-administered medications prior to visit.    Allergies  Allergen Reactions   Ceftin [Cefuroxime Axetil] Anaphylaxis    Swelling of eyes and tongue   Ciprofloxacin Anaphylaxis    Difficulty breathing and generalized swelling   Fosamax [Alendronate Sodium] Diarrhea   Morphine And Related Other (See Comments)    Hallucinations/ disoriented - pt stated, "Only Morphine" - 03/23/17   Oxycodone Other (See Comments)    Patient was "hooked" on medication.   Prednisone Other (See Comments)    Hyperactivity   Seroquel [Quetiapine] Other (See Comments)    oversedation    ROS As per HPI  PE: Vitals with BMI 04/09/2021 01/14/2021 01/14/2021  Height 5\' 7"  - -  Weight 188 lbs 6 oz - -  BMI 11.9 - -  Systolic 147 829 562  Diastolic 74 74 96  Pulse 80 66 71  T 99.3 02 sat on RA today is 94%  Physical Exam  VS: noted--normal. Gen: alert, NAD,  NONTOXIC APPEARING. HEENT: eyes without injection, drainage, or swelling.  Ears: EACs clear, TMs with normal light reflex and landmarks.  Nose: Clear rhinorrhea, with some dried, crusty exudate adherent to mildly injected mucosa.  No purulent d/c.  No paranasal sinus TTP.  No facial swelling.  Throat and mouth without focal lesion.  No pharyngial swelling, erythema, or exudate.   Neck: supple, no LAD.   LUNGS: CTA bilat, nonlabored resps.   CV: RRR, no m/r/g. EXT: no c/c/e SKIN: no rash   LABS:  Last metabolic panel Lab Results  Component Value Date   GLUCOSE 102 (H) 10/29/2020   NA 137 10/29/2020   K 3.7 10/29/2020   CL 99 10/29/2020   CO2 28 10/29/2020   BUN 21 10/29/2020   CREATININE 0.98 10/29/2020   GFRNONAA 55 (L) 03/02/2020   CALCIUM 10.0 10/29/2020   PROT 6.9 10/29/2020   ALBUMIN 4.8 10/29/2020   BILITOT 0.7 10/29/2020   ALKPHOS 67 10/29/2020   AST 19 10/29/2020   ALT 20 10/29/2020   ANIONGAP 10 03/02/2020   IMPRESSION AND PLAN:  Acute sinusitis. Azithromycin x5 days. Continue Flonase, antihistamine, and DuoNeb every 6 hours as needed. Fortunately, she is not in the midst of a COPD exacerbation.  An After Visit Summary was printed and given to the patient.  FOLLOW UP: Return if symptoms worsen or fail to improve.  Signed:  Crissie Sickles, MD  04/09/2021 ° °

## 2021-04-25 ENCOUNTER — Other Ambulatory Visit: Payer: Self-pay | Admitting: Family Medicine

## 2021-04-30 ENCOUNTER — Ambulatory Visit: Payer: Medicare Other | Admitting: Family Medicine

## 2021-05-05 ENCOUNTER — Other Ambulatory Visit: Payer: Self-pay

## 2021-05-06 ENCOUNTER — Encounter: Payer: Self-pay | Admitting: Family Medicine

## 2021-05-06 ENCOUNTER — Ambulatory Visit (INDEPENDENT_AMBULATORY_CARE_PROVIDER_SITE_OTHER): Payer: Medicare Other | Admitting: Family Medicine

## 2021-05-06 VITALS — BP 134/81 | HR 85 | Temp 98.0°F | Ht 67.0 in | Wt 189.6 lb

## 2021-05-06 DIAGNOSIS — R7303 Prediabetes: Secondary | ICD-10-CM

## 2021-05-06 DIAGNOSIS — Z79899 Other long term (current) drug therapy: Secondary | ICD-10-CM | POA: Diagnosis not present

## 2021-05-06 DIAGNOSIS — M25559 Pain in unspecified hip: Secondary | ICD-10-CM

## 2021-05-06 DIAGNOSIS — F319 Bipolar disorder, unspecified: Secondary | ICD-10-CM

## 2021-05-06 DIAGNOSIS — F411 Generalized anxiety disorder: Secondary | ICD-10-CM

## 2021-05-06 DIAGNOSIS — I10 Essential (primary) hypertension: Secondary | ICD-10-CM

## 2021-05-06 DIAGNOSIS — F5104 Psychophysiologic insomnia: Secondary | ICD-10-CM

## 2021-05-06 DIAGNOSIS — E7800 Pure hypercholesterolemia, unspecified: Secondary | ICD-10-CM | POA: Diagnosis not present

## 2021-05-06 LAB — LIPID PANEL
Cholesterol: 151 mg/dL (ref 0–200)
HDL: 58.9 mg/dL (ref >39.00)
LDL Cholesterol: 73 mg/dL (ref 0–99)
NonHDL: 92.33
Total CHOL/HDL Ratio: 3
Triglycerides: 98 mg/dL (ref 0.0–149.0)
VLDL: 19.6 mg/dL (ref 0.0–40.0)

## 2021-05-06 LAB — COMPREHENSIVE METABOLIC PANEL
ALT: 22 U/L (ref 0–35)
AST: 23 U/L (ref 0–37)
Albumin: 4.6 g/dL (ref 3.5–5.2)
Alkaline Phosphatase: 55 U/L (ref 39–117)
BUN: 13 mg/dL (ref 6–23)
CO2: 32 mEq/L (ref 19–32)
Calcium: 9.7 mg/dL (ref 8.4–10.5)
Chloride: 100 mEq/L (ref 96–112)
Creatinine, Ser: 0.91 mg/dL (ref 0.40–1.20)
GFR: 64.9 mL/min (ref >60.00)
Glucose, Bld: 85 mg/dL (ref 70–99)
Potassium: 4 mEq/L (ref 3.5–5.1)
Sodium: 137 mEq/L (ref 135–145)
Total Bilirubin: 0.8 mg/dL (ref 0.2–1.2)
Total Protein: 6.6 g/dL (ref 6.0–8.3)

## 2021-05-06 LAB — HEMOGLOBIN A1C: Hgb A1c MFr Bld: 5.7 % (ref 4.6–6.5)

## 2021-05-06 MED ORDER — LAMOTRIGINE 100 MG PO TABS: 300.0000 mg | ORAL_TABLET | Freq: Every day | ORAL | 3 refills | Status: DC

## 2021-05-06 MED ORDER — ZOLPIDEM TARTRATE 10 MG PO TABS: ORAL_TABLET | ORAL | 1 refills | Status: DC

## 2021-05-06 NOTE — Progress Notes (Signed)
OFFICE VISIT  05/06/2021  CC:  Chief Complaint  Patient presents with   Follow-up    RCI; pt is fasting    HPI:    Patient is a 71 y.o. female who presents for 6 mo f/u HTN, HLD, bipolar d/o (with anxiety and insomnia---high risk med use), prediabetes. A/P as of last visit: "1) HTN: well controlled on hctz 12.5, losartan 25 qd, toprol xl 100 qd. Lytes/cr today.   2) HLD: tolerating atorva 80 qd. FLP and hepatic panel today.   3) Bipolar d/o, GAD, insomnia: stable on lamictal, citalopram, alprazolam, and ambien. CSC UTD. UDS today (should show benzo).   4) Prediabetes: fasting glucose and Hba1c today.   5) Asthma, mod persistent. Using albut reasonably, no sign of inc meds needed. She will f/u with her pulm MD prior to her surgery in Oct.   6) Health maintenance exam: Reviewed age and gender appropriate health maintenance issues (prudent diet, regular exercise, health risks of tobacco and excessive alcohol, use of seatbelts, fire alarms in home, use of sunscreen).  Also reviewed age and gender appropriate health screening as well as vaccine recommendations. Vaccines: ALL UTD. Labs: fasting lipids, cbc, cmet, Hba1c (prediab). Cervical ca screening: per GYN Breast ca screening: per GYN. Colon ca screening: hx adenomas, recall 2027. Osteoporosis screening: per GYN-->osteopenia 2019 and 2021.  Plan is for repeat approx 10/2021. Ca + Vit D supp.   7) Preoperative clearance for R knee surg 01/2021. Low risk, clearance papers completed/signed, sent back to ortho."  INTERIM HX: She is doing okay. Mood and anxiety level fluctuate to a mild degree, nothing that she is worried about. Compliant with medications. She slipped and fell last fall, hit her head, had some neck pain.  This prevented her from having the right knee revision surgery that was planned in October. She got her neck evaluated by neurosurgery and they put her in PT and she says this has been improving  things.  Ongoing problems with left foot pain, history of surgery with the podiatrist.  Also with some hip pains from time to time, worse lately with the weather being more rainy.  Says it hurts in the lateral aspect of both hips.  Worse with walking and with lying on the side of the hip. No groin or buttocks pain.  She does have intermittent low back pain, mainly on the left side.    PMP AWARE reviewed today: most recent rx for alpraxolam was filled 04/27/21, # 66, rx by me. Most recent zolpidem rx filled 04/02/21, #90, rx by me. No red flags.  Past Medical History:  Diagnosis Date   Alcoholism in remission [facility])    states last alcohol 11 yrs ago   Anemia    Anxiety    Bipolar 1 disorder ([place])    Admission for mania x 2 (most recent 11/2017)   Cervical spondylosis without myelopathy 12/19/2013   MRI 02/2014: multilevel DDD/spondylosis, not much change compared to prior MRI.  Pt not in favor of invasive therapy for her neck as of 04/2014.   Cholelithiasis    COPD (chronic obstructive pulmonary disease) (HCC)    Moderate: anoro started 04/2017 by pulm, pt symptomatically improved and PFTs stable at f/u 06/2017.   Coronary artery spasm ([place]) 07/11/2018   nitrates=HA. Amlodipine=intol swelling.  Changed to coreg 09/03/18 by cardiologist.   Depression    Dilutional hyponatremia    Endometritis 05/2017   possible; empiric tx with Flagyl by GYN.   Environmental allergies  Fibromyalgia    GERD (gastroesophageal reflux disease)    Hepatic steatosis 03/2019   u/s abd   Herpes zoster 08/07/2017   L scapular region   Hiatal hernia    History of adenomatous polyp of colon 04/2008; 08/2014   No high grade dysplasia: recall 5 yrs ([name], [name])   History of basal cell carcinoma excision    NOSE   History of Helicobacter pylori infection 04/2008   +gastric biopsy (gastritis but no metaplasia, dysplasia, or malignancy identified)   History of kidney stones    History of TIA (transient  ischemic attack)    secondary to HELLP syndrome 1994 post c/s--  residual memory loss   Hyperlipidemia    statin started after her NSTEMI: goal  LDL <70.   Hypertension    Internal hemorrhoids    Ischemic cardiomyopathy 05/2018   Echo EF 45-50% in context of NSTEMI from CA spasm. 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 04/2017.   Leg pain    ABIs 11/2019: normal    Memory loss    Abnl MRI brain and CT brain c/w chronic microvascular ischemia.  Repeat MRI brain 02/2014 stable (Dr. Mayra Reel with no plan to f/u with neuro as of 04/2014)   NSTEMI (non-ST elevated myocardial infarction) ([place]) 05/2018   Echo EF 45-50% in context of NSTEMI from CA spasm.// Myoview 8/21: EF 66, no ischemia, low risk   Osteoporosis 05/2010   2012 'penia; 06/2015 'porosis:  prolia.  DEXA 09/2017 T-score -2.2 femur neck.  As of 11/2018 we're restarting prolia soon and will check next DEXA 1 yr from her next prolia injection.   Pelvic floor dysfunction    [facility] ([name])   Pneumonia    Postmenopausal vaginal bleeding 05/2017   x 1 small episode; GYN attempted endo bx but unable to penetrate cervix due to severe cerv stenosis (due to postmenopausal state + hx of LEEP).  Endo u/s showed thin endo lining.  Per GYN---no evidence of endomet pathology on w/u---obs as of 07/2017.   Prediabetes 06/2016   Fasting gluc 105; HbA1c at that time was 6.0%.  A1c 6.2% 12/2017.   Recurrent kidney stones 08/2013   Left ureteral calculus: Perc nephr--cystoscopy w/ureteroscopy + laser for stone removal.  Residual asymptomatic left renal nephrolithiasis <68mm present post-procedure.  Right sided hydronephrosis-persistent (on u/s)--urol ordered CT to further eval 08/2016: 1.6 cm  nonobstructing stone lower pole left kidney, no hydro, plan for PCN extraction [facility])   Shortness of breath    Sprain of neck 12/19/2013   Stroke (HCC)    TIA   TMJ (temporomandibular joint disorder)    USES  MOUTH GUARD   Toe fracture, left 12/2017   2nd toe prox phalanx (immobilization, [name])   Tubular adenoma of colon    Vitamin D deficiency 05/2011   Weak urinary stream 07/2016   with elevated PVR and mild left hydronephrosis secondary to this ([facility] started flomax 0.4mg  qd for this).    Past Surgical History:  Procedure Laterality Date   ABIs  11/29/2019   NORMAL   ANTERIOR CERVICAL DECOMP/DISCECTOMY FUSION  06/30/2009   C3 -- C5  AND EXPLORATION OF FUSION C5-7 W/  PLATE REMOVAL   BACK SURGERY     BUNIONECTOMY  Left    CARDIOVASCULAR STRESS TEST  12/03/2019   myoc perf im: NORMAL   CERVICAL FUSION  2002   anterior C5 -- C7   CERVICAL SPINE SURGERY  08/27/1999     C6 -- T1  LAMINECTOMY/  DISKECTOMY   CESAREAN SECTION  1994   CHOLECYSTECTOMY N/A 03/06/2020   Procedure: LAPAROSCOPIC CHOLECYSTECTOMY;  Surgeon: Felicie Morn, MD;  Location: [place];  Service: General;  Laterality: N/A;   COLONOSCOPY W/ POLYPECTOMY  04/2008;08/2014   2016 tubular adenoma x 1; +diverticulosis and int/ext hemorrhoids.  06/2020 adenoma, recall 5 yrs.   CYSTOSCOPY WITH URETEROSCOPY AND STENT PLACEMENT Left 08/28/2013   Procedure: CYSTOSCOPY, RGP,  WITH URETEROSCOPY AND STENT REMOVAL;  Surgeon: [name], MD;  Location[facility];  Service: Urology;  Laterality: Left;   DEXA  09/2017   T-score -2.2 femur neck: improved compared to 06/2015.   ESOPHAGOGASTRODUODENOSCOPY  2010; 08/2014   2016 +Candidal esophagitis; Mild chronic gastritis w/intestinal metaplasia--NEG H pylori, neg for eosinophilic esoph. 04/6604 esoph candidiasis (diflucan x 20d), o/w normal.   HOLMIUM LASER APPLICATION Left 30/16/0109   Procedure: HOLMIUM LASER APPLICATION;  Surgeon: [name],  MD;  Location: [facility];  Service: Urology;  Laterality: Left;   JOINT REPLACEMENT     LEFT HEART CATH AND CORONARY ANGIOGRAPHY N/A 05/15/2018   Evidence for spasm in distal LAD.  Mod RCA atherosclerosis, o/w no CAD.  Procedure: LEFT HEART CATH AND CORONARY ANGIOGRAPHY;  Surgeon: Leonie Man, MD;  Location: [place] [facility];  Service: Cardiovascular;  Laterality: N/A;   NEGATIVE SLEEP STUDY  04/01/2007   NEPHROLITHOTOMY Left 08/05/2013   Procedure: NEPHROLITHOTOMY PERCUTANEOUS;  Surgeon: [name], MD;  Location: [facility];  Service: Urology;  Laterality: Left;   POLYSOMNOGRAM  01/2019   NORMAL   POLYSOMNOGRAPHY  10/25/2018   Dr. Dion Saucier due to pt inadequat sleep time (83 min).   TONSILLECTOMY AND ADENOIDECTOMY  1975   TOTAL KNEE ARTHROPLASTY Right 2003   TRANSTHORACIC ECHOCARDIOGRAM  05/2018; 12/03/2019   (after MI secondary to arterial spasm): EF 45-50%; severe hypokinesis of apical anterolateral, inferolateral , anterior, and inferior LV myocardium. 11/2019 EF 55-60%, valves fine   ULTRASOUND EXAM,PELVIC COMPLETE ([facility] HX)  06/25/2015   NORMAL (done by GYN)    Outpatient Medications Prior to Visit  Medication Sig Dispense Refill   acetaminophen (TYLENOL) 500 MG tablet Take 1,000 mg by mouth every 6 (six) hours as needed for mild pain or headache.      albuterol (VENTOLIN HFA) 108 (90 Base) MCG/ACT inhaler INHALE 2 PUFFS INTO THE LUNGS EVERY 4 HOURS AS NEEDED FOR WHEEZE OR FOR SHORTNESS OF BREATH 8.5 each 2   ALPRAZolam (XANAX) 0.5 MG tablet TAKE 1 TABLET BY MOUTH THREE TIMES A DAY AS NEEDED FOR ANXIETY 90 tablet 5   aspirin EC 81 MG EC tablet Take 1 tablet (81 mg total) by mouth daily. 90 tablet 3   atorvastatin (LIPITOR) 80 MG tablet TAKE 1 TABLET BY MOUTH DAILY AT 6 PM. 90 tablet 3   citalopram (CELEXA) 20 MG tablet Take 1 tablet (20 mg total) by mouth in the morning and at bedtime. 180 tablet 3   cyclobenzaprine (FLEXERIL) 10 MG tablet  TAKE 1 TABLET BY MOUTH TWICE A DAY 60 tablet 1   doxycycline (VIBRAMYCIN) 50 MG capsule Take 50 mg by mouth daily.     famotidine (PEPCID) 40 MG tablet Take 1 tablet (40 mg total) by  mouth daily. 90 tablet 3   fluticasone (FLONASE) 50 MCG/ACT nasal spray SPRAY 2 SPRAYS INTO EACH NOSTRIL EVERY DAY 48 mL 1   hydrochlorothiazide (HYDRODIURIL) 12.5 MG tablet TAKE 1 TABLET BY MOUTH EVERY DAY 90 tablet 3   ipratropium-albuterol (DUONEB) 0.5-2.5 (3) MG/3ML SOLN TAKE 3 MLS BY NEBULIZATION 3 (THREE) TIMES DAILY AS NEEDED. 360 mL 3   losartan (COZAAR) 25 MG tablet TAKE 1 TABLET BY MOUTH EVERY DAY 90 tablet 3   Magnesium 250 MG TABS Take 250 mg by mouth daily.      metoprolol succinate (TOPROL-XL) 100 MG 24 hr tablet Take 1 tablet (100 mg total) by mouth daily. Take with or immediately following a meal. 90 tablet 3   montelukast (SINGULAIR) 10 MG tablet TAKE 1 TABLET BY MOUTH EVERY DAY IN THE EVENING 90 tablet 1   Multiple Vitamin (MULTI-VITAMINS) TABS Take 1 tablet by mouth daily.      pantoprazole (PROTONIX) 40 MG tablet TAKE 1 TABLET BY MOUTH EVERY DAY 90 tablet 1   polyethylene glycol powder (GLYCOLAX/MIRALAX) powder TAKE 17 G BY MOUTH DAILY. 3162 g 0   potassium citrate (UROCIT-K) 10 MEQ (1080 MG) SR tablet Take 10 mEq by mouth 2 (two) times a day.     Propylene Glycol 0.6 % SOLN Apply 1 drop to eye 2 (two) times daily.     tamsulosin (FLOMAX) 0.4 MG CAPS capsule Take 0.4 mg by mouth daily.     Tiotropium Bromide-Olodaterol (STIOLTO RESPIMAT) 2.5-2.5 MCG/ACT AERS Inhale 2 puffs into the lungs daily. 4 g 5   Tiotropium Bromide-Olodaterol (STIOLTO RESPIMAT) 2.5-2.5 MCG/ACT AERS Inhale 2 puffs into the lungs daily. 4 g 0   triamcinolone cream (KENALOG) 0.1 % Apply 1 application topically daily.     azithromycin (ZITHROMAX) 250 MG tablet 2 tabs po qd x 1d, then 1 tab po qd x 4d 6 tablet 0   lamoTRIgine (LAMICTAL) 100 MG tablet TAKE 3 TABLETS (300 MG TOTAL) BY MOUTH AT BEDTIME. 270 tablet 0   zolpidem  (AMBIEN) 10 MG tablet TAKE 1 TABLET BY MOUTH EVERYDAY AT BEDTIME 90 tablet 1   nitroGLYCERIN (NITROSTAT) 0.4 MG SL tablet Place 1 tablet (0.4 mg total) under the tongue every 5 (five) minutes x 3 doses as needed for chest pain. (Patient not taking: Reported on 04/09/2021) 25 tablet 3   No facility-administered medications prior to visit.    Allergies  Allergen Reactions   Ceftin [Cefuroxime Axetil] Anaphylaxis    Swelling of eyes and tongue   Ciprofloxacin Anaphylaxis    Difficulty breathing and generalized swelling   Fosamax [Alendronate Sodium] Diarrhea   Morphine And Related Other (See Comments)    Hallucinations/ disoriented - pt stated, "Only Morphine" - 03/23/17   Oxycodone Other (See Comments)    Patient was "hooked" on medication.   Prednisone Other (See Comments)    Hyperactivity   Seroquel [Quetiapine] Other (See Comments)    oversedation    ROS As per HPI  PE: Vitals with BMI 05/06/2021 04/09/2021 01/14/2021  Height 5\' 7"  5\' 7"  -  Weight 189 lbs 10 oz 188 lbs 6 oz -  BMI 00.92 33.0 -  Systolic 076 226 333  Diastolic 81 74 74  Pulse 85 80 66     Physical Exam  Gen: Alert, well appearing.  Patient is oriented to person, place, time, and situation. AFFECT: pleasant, lucid thought and speech. Hips: No tenderness.  Range of motion fully intact without pain.  No SI pain, no pain  with SI stress maneuvers.   LABS:  Last CBC Lab Results  Component Value Date   WBC 5.9 10/29/2020   HGB 12.4 10/29/2020   HCT 35.9 (L) 10/29/2020   MCV 91.5 10/29/2020   MCH 30.3 03/02/2020   RDW 13.1 10/29/2020   PLT 264.0 65/78/4696   Last metabolic panel Lab Results  Component Value Date   GLUCOSE 102 (H) 10/29/2020   NA 137 10/29/2020   K 3.7 10/29/2020   CL 99 10/29/2020   CO2 28 10/29/2020   BUN 21 10/29/2020   CREATININE 0.98 10/29/2020   GFRNONAA 55 (L) 03/02/2020   CALCIUM 10.0 10/29/2020   PROT 6.9 10/29/2020   ALBUMIN 4.8 10/29/2020   BILITOT 0.7 10/29/2020    ALKPHOS 67 10/29/2020   AST 19 10/29/2020   ALT 20 10/29/2020   ANIONGAP 10 03/02/2020   Last lipids Lab Results  Component Value Date   CHOL 157 10/29/2020   HDL 55.10 10/29/2020   LDLCALC 77 10/29/2020   LDLDIRECT 144.1 05/29/2013   TRIG 125.0 10/29/2020   CHOLHDL 3 10/29/2020   Last hemoglobin A1c Lab Results  Component Value Date   HGBA1C 5.7 10/29/2020   Last thyroid functions Lab Results  Component Value Date   TSH 1.55 02/19/2019   IMPRESSION AND PLAN:  Hypertension, well controlled on hctz 12.5, losartan 25 qd, toprol xl 100 qd. Lytes/cr today.  2 HLD: tolerating atorva 80 qd. FLP and hepatic panel today.  #3 bipolar disorder type II, generalized anxiety disorder, insomnia. Stable on lamictal, citalopram, alprazolam, and ambien.  Ambien 10 mg, 1 nightly, #90, 1 refill. No other meds needed refilled today. CSC UTD. UDS 10/2018 with appropriate results.   Plan repeat UDS at next CPE in 6 months  #4 prediabetes. Hemoglobin A1c and fasting sugar checked today.  #5 orthopedic:  Chronic left foot pain--she is going to get back in with her orthopedist about this, has history of left foot surgery with podiatry, patient reports that she was told she had poor healing in the foot after her surgery. Right knee, chronic pain.  History of total knee arthroplasty in 2003.  Plan was for revision of this in October 2022 but this was canceled due to patient having a fall. She will get back with orthopedics about this. Recurrent lateral hip pain (bilat), suspect mild gluteal med and min tendinopathy.    An After Visit Summary was printed and given to the patient.  FOLLOW UP: Return in about 6 months (around 11/03/2021) for annual CPE (fasting).  Signed:  Crissie Sickles, MD           05/06/2021

## 2021-05-07 ENCOUNTER — Encounter: Payer: Self-pay | Admitting: Family Medicine

## 2021-05-17 ENCOUNTER — Other Ambulatory Visit: Payer: Self-pay | Admitting: Pulmonary Disease

## 2021-05-20 ENCOUNTER — Other Ambulatory Visit: Payer: Self-pay

## 2021-05-20 ENCOUNTER — Ambulatory Visit: Payer: Medicare Other | Admitting: Podiatry

## 2021-05-20 DIAGNOSIS — L03032 Cellulitis of left toe: Secondary | ICD-10-CM | POA: Diagnosis not present

## 2021-05-20 DIAGNOSIS — L600 Ingrowing nail: Secondary | ICD-10-CM | POA: Diagnosis not present

## 2021-05-20 MED ORDER — DOXYCYCLINE HYCLATE 100 MG PO TABS: 100.0000 mg | ORAL_TABLET | Freq: Two times a day (BID) | ORAL | 0 refills | Status: DC

## 2021-05-20 NOTE — Patient Instructions (Signed)
Soak Instructions ° ° ° °THE DAY AFTER THE PROCEDURE ° °Place 1/4 cup of epsom salts (or betadine, or white vinegar) in a quart of warm tap water.  Submerge your foot or feet with outer bandage intact for the initial soak; this will allow the bandage to become moist and wet for easy lift off.  Once you remove your bandage, continue to soak in the solution for 20 minutes.  This soak should be done twice a day.  Next, remove your foot or feet from solution, blot dry the affected area and cover.  You may use a band aid large enough to cover the area or use gauze and tape.  Apply other medications to the area as directed by the doctor such as polysporin neosporin. ° °IF YOUR SKIN BECOMES IRRITATED WHILE USING THESE INSTRUCTIONS, IT IS OKAY TO SWITCH TO  WHITE VINEGAR AND WATER. Or you may use antibacterial soap and water to keep the toe clean ° °Monitor for any signs/symptoms of infection. Call the office immediately if any occur or go directly to the emergency room. Call with any questions/concerns. ° ° °

## 2021-05-24 NOTE — Progress Notes (Signed)
°  Subjective:  Patient ID: Erin Lopez, female    DOB: 09-16-1950,  MRN: [id number]  She has an ingrown toenail of the left great toe she had a pedicure about 2 to 3 weeks ago and developed tenderness and redness since then.  71 y.o. female presents with the above complaint. History confirmed with patient.   Objective:  Physical Exam: warm, good capillary refill, no trophic changes or ulcerative lesions, normal DP and PT pulses, and normal sensory exam. Left Foot: Ingrown nail with medial border slight paronychia Assessment:   1. Ingrowing left great toenail   2. Paronychia of great toe, left      Plan:  Patient was evaluated and treated and all questions answered.    Ingrown Nail, left -Patient elects to proceed with minor surgery to remove ingrown toenail today. Consent reviewed and signed by patient. -Ingrown nail excised. See procedure note. -Educated on post-procedure care including soaking. Written instructions provided and reviewed. -Infection seems to be resolving but Rx for doxycycline sent for 5 days as a precaution  Procedure: Excision of Ingrown Toenail Location: Left 1st toe medial nail borders. Anesthesia: Lidocaine 1% plain; 1.5 mL and Marcaine 0.5% plain; 1.5 mL, digital block. Skin Prep: Betadine. Dressing: Silvadene; telfa; dry, sterile, compression dressing. Technique: Following skin prep, the toe was exsanguinated and a tourniquet was secured at the base of the toe. The affected nail border was freed, split with a nail splitter, and excised. Chemical matrixectomy was then performed with phenol and irrigated out with alcohol. The tourniquet was then removed and sterile dressing applied. Disposition: Patient tolerated procedure well.   Return if symptoms worsen or fail to improve.

## 2021-06-01 ENCOUNTER — Telehealth: Payer: Self-pay | Admitting: *Deleted

## 2021-06-01 NOTE — Telephone Encounter (Signed)
° °  Pre-operative Risk Assessment    Patient Name: Erin Lopez  DOB: December 08, 1950 MRN: [id number]      Request for Surgical Clearance    Procedure:   RIGHT KNEE POLY vs TOTAL KNEE REVISION  Date of Surgery:  Clearance 10/06/21                                 Surgeon:  DR. Gaynelle Arabian Surgeon's Group or Practice Name:  [facility] Phone number:  [phone] Fax number:  [phone] ATTN: [name]   Type of Clearance Requested:   - Medical  - Pharmacy:  Hold Aspirin     Type of Anesthesia:   CHOICE   Additional requests/questions:    [name]   06/01/2021, 10:54 AM

## 2021-06-02 NOTE — Telephone Encounter (Signed)
Pt has appt with Dr. Radford Pax 06/23/21. Will add pre op clearance to appt notes. Will send FYI to requesting office pt has appt 06/23/21. Once the pt has been cleared the MD will have her nurse fax over clearance notes and any recommendations.  ?

## 2021-06-03 ENCOUNTER — Telehealth: Payer: Self-pay | Admitting: Family Medicine

## 2021-06-03 NOTE — Telephone Encounter (Signed)
Pt wanting to get a prescription for pain for her foot. ?Informed pt that Dr. Lynnda Child need to see her about her foot in order to prescribe her for pain medication.  ? ?Pt is scheduled for OV in 06/04/2021 ? ? ? ?

## 2021-06-03 NOTE — Telephone Encounter (Signed)
Noted as FYI

## 2021-06-04 ENCOUNTER — Ambulatory Visit (INDEPENDENT_AMBULATORY_CARE_PROVIDER_SITE_OTHER): Payer: Medicare Other | Admitting: Family Medicine

## 2021-06-04 ENCOUNTER — Encounter: Payer: Self-pay | Admitting: Family Medicine

## 2021-06-04 ENCOUNTER — Other Ambulatory Visit: Payer: Self-pay

## 2021-06-04 VITALS — BP 127/74 | HR 74 | Wt 185.8 lb

## 2021-06-04 DIAGNOSIS — M79672 Pain in left foot: Secondary | ICD-10-CM | POA: Diagnosis not present

## 2021-06-04 MED ORDER — OXYCODONE-ACETAMINOPHEN 5-325 MG PO TABS: 1.0000 | ORAL_TABLET | Freq: Four times a day (QID) | ORAL | 0 refills | Status: DC | PRN

## 2021-06-04 NOTE — Progress Notes (Addendum)
OFFICE VISIT  06/04/2021  CC:  Chief Complaint  Patient presents with   Foot Pain    Left    Patient is a 71 y.o. female who presents for left foot pain.  HPI: 71 year old female with complaint of foot pain that started 7 to 10 days ago after she started getting much more active.  Hurts around the inferior aspect of lateral aspect of ankle, foot as well as over the tops of the MTP joints.  No swelling or erythema.  No trauma. Has tried occasional ibuprofen tab.  Says the pain is about 2 out of 10 intensity when she wakes up and by the end of the day is about 8 out of 10 intensity. She has Ortho follow-up in about 1-2 weeks.  Pt with extensive hx of left foot problems-Hallux deformity+adhesive capsulitis+ hammertoe:  Severe structural bunion deformity with hallux interphalangeus and severe arthritis of the second toe left foot--surgical repair/osteotomies by Dr. Charlsie Merles 04/2017. Additionally, she had an ingrown toenail of the great toe on the left foot removed 05/20/2021 by her podiatrist.     Past Medical History:  Diagnosis Date   Alcoholism in remission [facility])    states last alcohol 11 yrs ago   Anemia    Anxiety    Bipolar 1 disorder (HCC)    Admission for mania x 2 (most recent 11/2017)   Cervical spondylosis without myelopathy 12/19/2013   MRI 02/2014: multilevel DDD/spondylosis, not much change compared to prior MRI.  Pt not in favor of invasive therapy for her neck as of 04/2014.   Cholelithiasis    COPD (chronic obstructive pulmonary disease) (HCC)    Moderate: anoro started 04/2017 by pulm, pt symptomatically improved and PFTs stable at f/u 06/2017.   Coronary artery spasm (HCC) 07/11/2018   nitrates=HA. Amlodipine=intol swelling.  Changed to coreg 09/03/18 by cardiologist.   Depression    Dilutional hyponatremia    Endometritis 05/2017   possible; empiric tx with Flagyl by GYN.   Environmental allergies    Fibromyalgia    GERD (gastroesophageal reflux disease)    Hepatic  steatosis 03/2019   u/s abd   Herpes zoster 08/07/2017   L scapular region   Hiatal hernia    History of adenomatous polyp of colon 04/2008; 08/2014   No high grade dysplasia: recall 5 yrs (Dr. Bosie Clos, [facility])   History of basal cell carcinoma excision    NOSE   History of Helicobacter pylori infection 04/2008   +gastric biopsy (gastritis but no metaplasia, dysplasia, or malignancy identified)   History of kidney stones    History of TIA (transient ischemic attack)    secondary to HELLP syndrome 1994 post c/s--  residual memory loss   Hyperlipidemia    statin started after her NSTEMI: goal  LDL <70.   Hypertension    Internal hemorrhoids    Ischemic cardiomyopathy 05/2018   Echo EF 45-50% in context of NSTEMI from CA spasm. 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 Charlsie Merles 04/2017.   Leg pain    ABIs 11/2019: normal    Memory loss    Abnl MRI brain and CT brain c/w chronic microvascular ischemia.  Repeat MRI brain 02/2014 stable (Dr. Radonna Ricker with no plan to f/u with neuro as of 04/2014)   NSTEMI (  non-ST elevated myocardial infarction) (HCC) 05/2018   Echo EF 45-50% in context of NSTEMI from CA spasm.// Myoview 8/21: EF 66, no ischemia, low risk   Osteoporosis 05/2010   2012 'penia; 06/2015 'porosis:  prolia.  DEXA 09/2017 T-score -2.2 femur neck.  As of 11/2018 we're restarting prolia soon and will check next DEXA 1 yr from her next prolia injection.   Pelvic floor dysfunction    [facility] (Dr. Marlou Porch)   Pneumonia    Postmenopausal vaginal bleeding 05/2017   x 1 small episode; GYN attempted endo bx but unable to penetrate cervix due to severe cerv stenosis (due to postmenopausal state + hx of LEEP).  Endo u/s showed thin endo lining.  Per  GYN---no evidence of endomet pathology on w/u---obs as of 07/2017.   Prediabetes 06/2016   Fasting gluc 105; HbA1c at that time was 6.0%.  A1c 6.2% 12/2017.  A1c 5.7% Feb 2023.   Recurrent kidney stones 08/2013   Left ureteral calculus: Perc nephr--cystoscopy w/ureteroscopy + laser for stone removal.  Residual asymptomatic left renal nephrolithiasis <34mm present post-procedure.  Right sided hydronephrosis-persistent (on u/s)--urol ordered CT to further eval 08/2016: 1.6 cm nonobstructing stone lower pole left kidney, no hydro, plan for PCN extraction ([facility])   Shortness of breath    Sprain of neck 12/19/2013   Stroke (HCC)    TIA   TMJ (temporomandibular joint disorder)    USES  MOUTH GUARD   Toe fracture, left 12/2017   2nd toe prox phalanx (immobilization, Dr. Luciana Axe)   Tubular adenoma of colon    Vitamin D deficiency 05/2011   Weak urinary stream 07/2016   with elevated PVR and mild left hydronephrosis secondary to this ([facility] started flomax 0.4mg  qd for this).    Past Surgical History:  Procedure Laterality Date   ABIs  11/29/2019   NORMAL   ANTERIOR CERVICAL DECOMP/DISCECTOMY FUSION  06/30/2009   C3 -- C5  AND EXPLORATION OF FUSION C5-7 W/  PLATE REMOVAL   BACK SURGERY     BUNIONECTOMY Left    CARDIOVASCULAR STRESS TEST  12/03/2019   myoc perf im: NORMAL   CERVICAL FUSION  2002   anterior C5 -- C7   CERVICAL SPINE SURGERY  08/27/1999     C6 -- T1  LAMINECTOMY/  DISKECTOMY   CESAREAN SECTION  1994   CHOLECYSTECTOMY N/A 03/06/2020   Procedure: LAPAROSCOPIC CHOLECYSTECTOMY;  Surgeon: Quentin Ore, MD;  Location: [facility];  Service: General;  Laterality: N/A;   COLONOSCOPY W/ POLYPECTOMY  04/2008;08/2014   2016 tubular adenoma x 1; +diverticulosis and int/ext hemorrhoids.  06/2020 adenoma, recall 5 yrs.   CYSTOSCOPY WITH URETEROSCOPY AND STENT PLACEMENT Left 08/28/2013   Procedure: CYSTOSCOPY, RGP,  WITH URETEROSCOPY AND STENT REMOVAL;  Surgeon: Valetta Fuller, MD;   Location[facility];  Service: Urology;  Laterality: Left;   DEXA  09/2017   T-score -2.2 femur neck: improved compared to 06/2015.   ESOPHAGOGASTRODUODENOSCOPY  2010; 08/2014   2016 +Candidal esophagitis; Mild chronic gastritis w/intestinal metaplasia--NEG H pylori, neg for eosinophilic esoph. 06/2020 esoph candidiasis (diflucan x 20d), o/w normal.   HOLMIUM LASER APPLICATION Left 08/28/2013   Procedure: HOLMIUM LASER APPLICATION;  Surgeon: Valetta Fuller, MD;  [facility];  Service: Urology;  Laterality: Left;   JOINT REPLACEMENT     LEFT HEART CATH AND CORONARY ANGIOGRAPHY N/A 05/15/2018   Evidence for spasm in distal LAD.  Mod RCA atherosclerosis, o/w no CAD.  Procedure: LEFT HEART CATH AND CORONARY ANGIOGRAPHY;  Surgeon: Marykay Lex, MD;  Location: [facility] INVASIVE CV LAB;  Service: Cardiovascular;  Laterality: N/A;   NEGATIVE SLEEP STUDY  04/01/2007   NEPHROLITHOTOMY Left 08/05/2013   Procedure: NEPHROLITHOTOMY PERCUTANEOUS;  Surgeon: Valetta Fuller, MD;  Location: [facility];  Service: Urology;  Laterality: Left;   POLYSOMNOGRAM  01/2019   NORMAL   POLYSOMNOGRAPHY  10/25/2018   Dr. Luetta Nutting due to pt inadequat sleep time (83 min).   TONSILLECTOMY AND ADENOIDECTOMY  1975   TOTAL KNEE ARTHROPLASTY Right 2003   TRANSTHORACIC ECHOCARDIOGRAM  05/2018; 12/03/2019   (after MI secondary to arterial spasm): EF 45-50%; severe hypokinesis of apical anterolateral, inferolateral , anterior, and inferior LV myocardium. 11/2019 EF 55-60%, valves fine   ULTRASOUND EXAM,PELVIC COMPLETE ([facility] HX)  06/25/2015   NORMAL (done by GYN)    Outpatient Medications Prior to Visit  Medication Sig Dispense Refill   acetaminophen (TYLENOL) 500 MG tablet Take 1,000 mg by mouth every 6 (six) hours as needed for mild pain or headache.      albuterol (VENTOLIN HFA) 108 (90 Base) MCG/ACT inhaler INHALE 2 PUFFS INTO THE LUNGS EVERY 4 HOURS AS NEEDED FOR WHEEZE OR  FOR SHORTNESS OF BREATH 8.5 each 2   ALPRAZolam (XANAX) 0.5 MG tablet TAKE 1 TABLET BY MOUTH THREE TIMES A DAY AS NEEDED FOR ANXIETY 90 tablet 5   aspirin EC 81 MG EC tablet Take 1 tablet (81 mg total) by mouth daily. 90 tablet 3   atorvastatin (LIPITOR) 80 MG tablet TAKE 1 TABLET BY MOUTH DAILY AT 6 PM. 90 tablet 3   citalopram (CELEXA) 20 MG tablet Take 1 tablet (20 mg total) by mouth in the morning and at bedtime. 180 tablet 3   cyclobenzaprine (FLEXERIL) 10 MG tablet TAKE 1 TABLET BY MOUTH TWICE A DAY 60 tablet 1   doxycycline (VIBRA-TABS) 100 MG tablet Take 1 tablet (100 mg total) by mouth 2 (two) times daily. 10 tablet 0   doxycycline (VIBRAMYCIN) 50 MG capsule Take 50 mg by mouth daily.     famotidine (PEPCID) 40 MG tablet Take 1 tablet (40 mg total) by mouth daily. 90 tablet 3   fluticasone (FLONASE) 50 MCG/ACT nasal spray SPRAY 2 SPRAYS INTO EACH NOSTRIL EVERY DAY 48 mL 1   hydrochlorothiazide (HYDRODIURIL) 12.5 MG tablet TAKE 1 TABLET BY MOUTH EVERY DAY 90 tablet 3   ipratropium-albuterol (DUONEB) 0.5-2.5 (3) MG/3ML SOLN TAKE 3 MLS BY NEBULIZATION 3 (THREE) TIMES DAILY AS NEEDED. 360 mL 3   lamoTRIgine (LAMICTAL) 100 MG tablet Take 3 tablets (300 mg total) by mouth at bedtime. 270 tablet 3   losartan (COZAAR) 25 MG tablet TAKE 1 TABLET BY MOUTH EVERY DAY 90 tablet 3   Magnesium 250 MG TABS Take 250 mg by mouth daily.      metoprolol succinate (TOPROL-XL) 100 MG 24 hr tablet Take 1 tablet (100 mg total) by mouth daily. Take with or immediately following a meal. 90 tablet 3   montelukast (SINGULAIR) 10 MG tablet TAKE 1 TABLET BY MOUTH EVERY DAY IN THE EVENING 90 tablet 1   Multiple Vitamin (MULTI-VITAMINS) TABS Take 1 tablet by mouth daily.      nitroGLYCERIN (NITROSTAT) 0.4 MG SL tablet Place 1 tablet (0.4 mg total) under the tongue every 5 (five) minutes x 3 doses as needed for chest pain. (Patient not taking: Reported on 04/09/2021) 25 tablet 3   pantoprazole (PROTONIX) 40 MG tablet TAKE  1  TABLET BY MOUTH EVERY DAY 90 tablet 1   polyethylene glycol powder (GLYCOLAX/MIRALAX) powder TAKE 17 G BY MOUTH DAILY. 3162 g 0   potassium citrate (UROCIT-K) 10 MEQ (1080 MG) SR tablet Take 10 mEq by mouth 2 (two) times a day.     Propylene Glycol 0.6 % SOLN Apply 1 drop to eye 2 (two) times daily.     tamsulosin (FLOMAX) 0.4 MG CAPS capsule Take 0.4 mg by mouth daily.     Tiotropium Bromide-Olodaterol (STIOLTO RESPIMAT) 2.5-2.5 MCG/ACT AERS Inhale 2 puffs into the lungs daily. 4 g 0   Tiotropium Bromide-Olodaterol (STIOLTO RESPIMAT) 2.5-2.5 MCG/ACT AERS INHALE 2 PUFFS BY MOUTH INTO THE LUNGS DAILY 4 g 5   triamcinolone cream (KENALOG) 0.1 % Apply 1 application topically daily.     zolpidem (AMBIEN) 10 MG tablet TAKE 1 TABLET BY MOUTH EVERYDAY AT BEDTIME 90 tablet 1   No facility-administered medications prior to visit.    Allergies  Allergen Reactions   Ceftin [Cefuroxime Axetil] Anaphylaxis    Swelling of eyes and tongue   Ciprofloxacin Anaphylaxis    Difficulty breathing and generalized swelling   Fosamax [Alendronate Sodium] Diarrhea   Morphine And Related Other (See Comments)    Hallucinations/ disoriented - pt stated, "Only Morphine" - 03/23/17   Oxycodone Other (See Comments)    Patient was "hooked" on medication.   Prednisone Other (See Comments)    Hyperactivity   Seroquel [Quetiapine] Other (See Comments)    oversedation    ROS As per HPI  PE: Vitals with BMI 06/04/2021 05/06/2021 04/09/2021  Height - 5\' 7"  5\' 7"   Weight 185 lbs 13 oz 189 lbs 10 oz 188 lbs 6 oz  BMI 29.09 29.69 29.5  Systolic 127 134 034  Diastolic 74 81 74  Pulse 74 85 80     Physical Exam  General: Alert and well-appearing. Left foot: No erythema or swelling.  Left ankle full range of motion without pain. Mild tender to patient just inferior lateral to the lateral malleolus. No tendon subluxation. She has significant tenderness to palpation over the second toe, mainly proximal phalanx  region.  The other toes are much less sensitive to palpation. No swelling of the toes or toe joints Neurovascularly intact.  No tenderness over the plantar surface/metatarsal heads  LABS:  Last CBC Lab Results  Component Value Date   WBC 5.9 10/29/2020   HGB 12.4 10/29/2020   HCT 35.9 (L) 10/29/2020   MCV 91.5 10/29/2020   MCH 30.3 03/02/2020   RDW 13.1 10/29/2020   PLT 264.0 10/29/2020   Last metabolic panel Lab Results  Component Value Date   GLUCOSE 85 05/06/2021   NA 137 05/06/2021   K 4.0 05/06/2021   CL 100 05/06/2021   CO2 32 05/06/2021   BUN 13 05/06/2021   CREATININE 0.91 05/06/2021   GFRNONAA 55 (L) 03/02/2020   CALCIUM 9.7 05/06/2021   PROT 6.6 05/06/2021   ALBUMIN 4.6 05/06/2021   BILITOT 0.8 05/06/2021   ALKPHOS 55 05/06/2021   AST 23 05/06/2021   ALT 22 05/06/2021   ANIONGAP 10 03/02/2020   IMPRESSION AND PLAN:  Acute on chronic left foot pain.  No sign of acute injury Bedside ultrasound today unremarkable except some question of mild joint capsule distention and some hypoechoic changes at 2nd toe PIP jt.  No hyperemia on power Doppler.  Possible capsulitis.  Recommended Aleve for 40 twice daily x7 days.  Take with food. Recommended ice 20 minutes twice a  day.  Recommended relative rest. She has appropriate follow-up with her orthopedist and she says x-rays planned at that time as well I did prescribe Percocet 5/325, 1 tab every 6 hours as needed, #30.  I emphasized the fact that her goal should not be complete resolution of the pain, but just significant improvement.  An After Visit Summary was printed and given to the patient.  FOLLOW UP: Return if symptoms worsen or fail to improve.  Signed:  Santiago Bumpers, MD           06/04/2021

## 2021-06-04 NOTE — Patient Instructions (Signed)
Take 2 otc aleve tabs with food twice a day for 7 days ? ?Ice the areas of pain for 20 min 2 times a day. ? ?Rest the foot ?

## 2021-06-15 ENCOUNTER — Other Ambulatory Visit: Payer: Self-pay | Admitting: Family Medicine

## 2021-06-15 ENCOUNTER — Other Ambulatory Visit: Payer: Self-pay | Admitting: Internal Medicine

## 2021-06-17 ENCOUNTER — Telehealth: Payer: Self-pay

## 2021-06-17 NOTE — Telephone Encounter (Signed)
LVM for pt to CB and schedule Medicare Annual Wellness Visit (AWV).   ?

## 2021-06-21 ENCOUNTER — Other Ambulatory Visit: Payer: Self-pay | Admitting: Family Medicine

## 2021-06-21 DIAGNOSIS — T8484XA Pain due to internal orthopedic prosthetic devices, implants and grafts, initial encounter: Secondary | ICD-10-CM | POA: Diagnosis not present

## 2021-06-21 DIAGNOSIS — M21612 Bunion of left foot: Secondary | ICD-10-CM | POA: Diagnosis not present

## 2021-06-21 DIAGNOSIS — M7742 Metatarsalgia, left foot: Secondary | ICD-10-CM | POA: Diagnosis not present

## 2021-06-21 DIAGNOSIS — M2022 Hallux rigidus, left foot: Secondary | ICD-10-CM | POA: Diagnosis not present

## 2021-06-21 DIAGNOSIS — M2042 Other hammer toe(s) (acquired), left foot: Secondary | ICD-10-CM | POA: Diagnosis not present

## 2021-06-22 ENCOUNTER — Telehealth: Payer: Self-pay | Admitting: *Deleted

## 2021-06-22 NOTE — Telephone Encounter (Signed)
? ?  Pre-operative Risk Assessment  ?  ?Patient Name: Erin Lopez  ?DOB: Aug 05, 1950 ?MRN: [id number]  ? ?  ? ?Request for Surgical Clearance   ? ?Procedure:   LEFT FOOT HARDWARE REMOVAL/ARTHRODESIS ? ?Date of Surgery:  Clearance TBD                              ?   ?Surgeon:  DR. Jenny Reichmann HEWITT ?Surgeon's Group or Practice Name:  [facility] ?Phone number:  [phone] ?Fax number:  [phone] ATTN: [name] ?  ?Type of Clearance Requested:   ?- Medical  ?- Pharmacy:  Hold Aspirin   ?  ?Type of Anesthesia:  General  ?  ?Additional requests/questions:   ? ?Signed, ?[name]   ?06/22/2021, 10:46 AM  ? ?

## 2021-06-23 ENCOUNTER — Other Ambulatory Visit: Payer: Self-pay

## 2021-06-23 ENCOUNTER — Encounter: Payer: Self-pay | Admitting: Cardiology

## 2021-06-23 ENCOUNTER — Ambulatory Visit: Payer: Medicare Other | Admitting: Cardiology

## 2021-06-23 ENCOUNTER — Telehealth: Payer: Self-pay

## 2021-06-23 VITALS — BP 122/76 | HR 83 | Ht 67.5 in | Wt 187.8 lb

## 2021-06-23 DIAGNOSIS — I2510 Atherosclerotic heart disease of native coronary artery without angina pectoris: Secondary | ICD-10-CM | POA: Diagnosis not present

## 2021-06-23 DIAGNOSIS — I201 Angina pectoris with documented spasm: Secondary | ICD-10-CM | POA: Diagnosis not present

## 2021-06-23 DIAGNOSIS — I255 Ischemic cardiomyopathy: Secondary | ICD-10-CM | POA: Diagnosis not present

## 2021-06-23 DIAGNOSIS — I2583 Coronary atherosclerosis due to lipid rich plaque: Secondary | ICD-10-CM

## 2021-06-23 DIAGNOSIS — E7800 Pure hypercholesterolemia, unspecified: Secondary | ICD-10-CM

## 2021-06-23 DIAGNOSIS — I25110 Atherosclerotic heart disease of native coronary artery with unstable angina pectoris: Secondary | ICD-10-CM | POA: Diagnosis not present

## 2021-06-23 DIAGNOSIS — I10 Essential (primary) hypertension: Secondary | ICD-10-CM | POA: Diagnosis not present

## 2021-06-23 NOTE — Patient Instructions (Signed)

## 2021-06-23 NOTE — Telephone Encounter (Signed)
Erin Lopez. [name]" 71 year old female is requesting preoperative cardiac evaluation for left foot hardware removal/arthrodesis.  She was last seen in the clinic on 8/22.  During that time she continued to be stable from a cardiac standpoint.  Follow-up was planned for 6 months. ? ? ?Her past medical history includes of CAD status post NSTEMI 05/2018 felt secondary to vasospasm with 60% distal LAD and 40% mid RCA no ischemia on Lexiscan 10/2019 normal LVEF on echo, hypertension, HLD, COPD, GERD, sleep study negative for sleep apnea, history of TIA. ? ?May her aspirin be held prior to her procedure? ? ?Thank you for your help.  Please direct your response to CV DIV preop pool. ? ? ?Jossie Ng. Anaia Frith NP-C ? ?  ?06/23/2021, 9:34 AM ?[facility] ?[address] ?Office [phone] Fax [phone] ? ?

## 2021-06-23 NOTE — Progress Notes (Signed)
? ?Date:  06/23/2021  ? ?ID:  ASAKO SALIBA, DOB 10/03/1950, MRN [id number] ? ? ?PCP:  Tammi Sou, MD  ?Cardiologist:  Fransico Him, MD  ?Electrophysiologist:  None  ? ?Chief Complaint:  HTN, lipids, CP, coronary vasospasm ? ?History of Present Illness:   ? ?SARABELLA CAPRIO is a 71 y.o. 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 She is intolerant to nitrates due to headache so she was changed to Amlodipine.  She also has HTN, hyperlipidemia, COPD and GERD.   ? ?Leane Call 2021 showed no ischemia.  2D echo showed normal heart function, and mild pulmonary HTN with PASP 49mHg.  There was no significant valvular heart disease.  ? ?She is here today for followup and is doing well.  She has chronic stable DOE that is stable. She denies any chest pain or pressure, PND, orthopnea, LE edema, dizziness, palpitations or syncope. She is compliant with her meds and is tolerating meds with no SE.    ? ?Prior CV studies:   ?The following studies were reviewed today: ? ?Lexiscan myoview and 2D echo ? ?Past Medical History:  ?Diagnosis Date  ? Alcoholism in remission ([facility]   ? states last alcohol 11 yrs ago  ? Anemia   ? Anxiety   ? [place] 1 [place] ([place]   ? Admission for mania x 2 (most recent 11/2017)  ? Cervical spondylosis without myelopathy 12/19/2013  ? MRI 02/2014: multilevel DDD/spondylosis, not much change compared to prior MRI.  Pt not in favor of invasive therapy for her neck as of 04/2014.  ? Cholelithiasis   ? COPD (chronic obstructive pulmonary disease) ([place]   ? Moderate: anoro started 04/2017 by pulm, pt symptomatically improved and PFTs stable at f/u 06/2017.  ? Coronary artery spasm ([place] 07/11/2018  ? nitrates=HA. Amlodipine=intol swelling.  Changed to coreg 09/03/18 by cardiologist.  ? Depression   ? Dilutional hyponatremia   ? Endometritis 05/2017  ? possible; empiric tx with  Flagyl by GYN.  ? Environmental allergies   ? Fibromyalgia   ? GERD (gastroesophageal reflux disease)   ? Hepatic steatosis 03/2019  ? u/s abd  ? Herpes zoster 08/07/2017  ? L scapular region  ? Hiatal hernia   ? History of adenomatous polyp of colon 04/2008; 08/2014  ? No high grade dysplasia: recall 5 yrs ([facility])  ? History of basal cell carcinoma excision   ? NOSE  ? History of Helicobacter pylori infection 04/2008  ? +gastric biopsy (gastritis but no metaplasia, dysplasia, or malignancy identified)  ? History of kidney stones   ? History of TIA (transient ischemic attack)   ? 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 Hyperlipidemia   ? statin started after her NSTEMI: goal  LDL <70.  ? Hypertension   ? Internal hemorrhoids   ? Ischemic cardiomyopathy 05/2018  ? Echo EF 45-50% in context of NSTEMI from CA spasm. // Echo 8/21: 55-60, mild LVH, normal RVSF, RVSP 40, trivial MR  ? Left foot pain   ? Hallux deformity+adhesive capsulitis+ hammertoe:  Severe structural bunion deformity with hallux interphalangeus and severe arthritis of the second MPJ left foot--surgical repair/osteotomies by Dr. RPaulla Dolly1/2019.  ? Leg pain   ? ABIs 11/2019: normal   ? Memory loss   ? Abnl MRI brain and CT brain c/w chronic  microvascular ischemia.  Repeat MRI brain 02/2014 stable (Dr. Mayra Reel with no plan to f/u with neuro as of 04/2014)  ? NSTEMI (non-ST elevated myocardial infarction) ([place]) 05/2018  ? Echo EF 45-50% in context of NSTEMI from CA spasm.// Myoview 8/21: EF 66, no ischemia, low risk  ? Osteoporosis 05/2010  ? 2012 'penia; 06/2015 'porosis:  prolia.  DEXA 09/2017 T-score -2.2 femur neck.  As of 11/2018 we're restarting prolia soon and will check next DEXA 1 yr from her next prolia injection.  ? Pelvic floor dysfunction   ? [facility] ([name])  ? Pneumonia   ? Postmenopausal vaginal bleeding 05/2017  ? x 1 small episode; GYN attempted endo bx but unable to penetrate cervix  due to severe cerv stenosis (due to postmenopausal state + hx of LEEP).  Endo u/s showed thin endo lining.  Per GYN---no evidence of endomet pathology on w/u---obs as of 07/2017.  ? Prediabetes 06/2016  ? Fasting gluc 105; HbA1c at that time was 6.0%.  A1c 6.2% 12/2017.  A1c 5.7% Feb 2023.  ? Recurrent kidney stones 08/2013  ? Left ureteral calculus: Perc nephr--cystoscopy w/ureteroscopy + laser for stone removal.  Residual asymptomatic left renal nephrolithiasis <108m present post-procedure.  Right sided hydronephrosis-persistent (on u/s)--urol ordered CT to further eval 08/2016: 1.6 cm nonobstructing stone lower pole left kidney, no hydro, plan for PCN extraction ([facility]  ? Shortness of breath   ? Sprain of neck 12/19/2013  ? Stroke ([facility]   ? TIA  ? TMJ (temporomandibular joint disorder)   ? USES  MOUTH GUARD  ? Toe fracture, left 12/2017  ? 2nd toe prox phalanx (immobilization, Dr. RZadie Rhine  ? Tubular adenoma of colon   ? Vitamin D deficiency 05/2011  ? Weak urinary stream 07/2016  ? with elevated PVR and mild left hydronephrosis secondary to this ([facility] started flomax 0.'4mg'$  qd for this).  ? ?Past Surgical History:  ?Procedure Laterality Date  ? ABIs  11/29/2019  ? NORMAL  ? ANTERIOR CERVICAL DECOMP/DISCECTOMY FUSION  06/30/2009  ? C3 -- C5  AND EXPLORATION OF FUSION C5-7 W/  PLATE REMOVAL  ? BACK SURGERY    ? BUNIONECTOMY Left   ? CARDIOVASCULAR STRESS TEST  12/03/2019  ? myoc perf im: NORMAL  ? CERVICAL FUSION  2002  ? anterior C5 -- C7  ? CERVICAL SPINE SURGERY  08/27/1999  ?   C6 -- T1  LAMINECTOMY/  DISKECTOMY  ? [place] ? CHOLECYSTECTOMY N/A 03/06/2020  ? Procedure: LAPAROSCOPIC CHOLECYSTECTOMY;  Surgeon: SFelicie Morn MD;  [facility]  Service: General;  Laterality: N/A;  ? COLONOSCOPY W/ POLYPECTOMY  04/2008;08/2014  ? 2016 tubular adenoma x 1; +diverticulosis and int/ext hemorrhoids.  06/2020 adenoma, recall 5 yrs.  ? CYSTOSCOPY WITH URETEROSCOPY AND STENT PLACEMENT Left  08/28/2013  ? Procedure: CYSTOSCOPY, RGP,  WITH URETEROSCOPY AND STENT REMOVAL;  Surgeon: DBernestine Amass MD;  [facility]  Service: Urology;  Laterality: Left;  ? DEXA  09/2017  ? T-score -2.2 femur neck: improved compared to 06/2015.  ? ESOPHAGOGASTRODUODENOSCOPY  2010; 08/2014  ? 2016 +Candidal esophagitis; Mild chronic gastritis w/intestinal metaplasia--NEG H pylori, neg for eosinophilic esoph. 34/1287esoph candidiasis (diflucan x 20d), o/w normal.  ? HOLMIUM LASER APPLICATION Left [id number]/76/7209 ? Procedure: HOLMIUM LASER APPLICATION;  Surgeon: DBernestine Amass MD;  [facility]  Service: Urology;  Laterality: Left;  ? JOINT REPLACEMENT    ? LEFT HEART  CATH AND CORONARY ANGIOGRAPHY N/A 05/15/2018  ? Evidence for spasm in distal LAD.  Mod RCA atherosclerosis, o/w no CAD.  Procedure: LEFT HEART CATH AND CORONARY ANGIOGRAPHY;  Surgeon: Leonie Man, MD;  Location: [place] CV LAB;  Service: Cardiovascular;  Laterality: N/A;  ? NEGATIVE SLEEP STUDY  04/01/2007  ? NEPHROLITHOTOMY Left 08/05/2013  ? Procedure: NEPHROLITHOTOMY PERCUTANEOUS;  Surgeon: [name], MD;  [facility];  Service: Urology;  Laterality: Left;  ? POLYSOMNOGRAM  01/2019  ? NORMAL  ? POLYSOMNOGRAPHY  10/25/2018  ? Dr. Dion Saucier due to pt inadequat sleep time (83 min).  ? TONSILLECTOMY AND ADENOIDECTOMY  1975  ? TOTAL KNEE ARTHROPLASTY Right 2003  ? TRANSTHORACIC ECHOCARDIOGRAM  05/2018; 12/03/2019  ? (after MI secondary to arterial spasm): EF 45-50%; severe hypokinesis of apical anterolateral, inferolateral , anterior, and inferior LV myocardium. 11/2019 EF 55-60%, valves fine  ? ULTRASOUND EXAM,PELVIC COMPLETE ([facility] HX)  06/25/2015  ? NORMAL (done by GYN)  ?  ? ?Current Meds  ?Medication Sig  ? acetaminophen (TYLENOL) 500 MG tablet Take 1,000 mg by mouth every 6 (six) hours as needed for mild pain or headache.   ? albuterol (VENTOLIN HFA) 108 (90 Base) MCG/ACT inhaler  INHALE 2 PUFFS INTO THE LUNGS EVERY 4 HOURS AS NEEDED FOR WHEEZE OR FOR SHORTNESS OF BREATH  ? ALPRAZolam (XANAX) 0.5 MG tablet TAKE 1 TABLET BY MOUTH THREE TIMES A DAY AS NEEDED FOR ANXIETY  ? aspirin EC

## 2021-06-23 NOTE — Telephone Encounter (Signed)
Pt contacted to schedule surgical clearance appt. Pt is currently scheduled for 7/5. Informed all necessary labs would have to be completed within 6 week window (5/24-7/5). Pt agreed to schedule. Appt made ?

## 2021-06-24 ENCOUNTER — Other Ambulatory Visit (HOSPITAL_COMMUNITY): Payer: Self-pay | Admitting: Orthopedic Surgery

## 2021-06-24 ENCOUNTER — Ambulatory Visit (INDEPENDENT_AMBULATORY_CARE_PROVIDER_SITE_OTHER): Payer: Medicare Other

## 2021-06-24 DIAGNOSIS — Z9289 Personal history of other medical treatment: Secondary | ICD-10-CM | POA: Diagnosis not present

## 2021-06-24 DIAGNOSIS — M810 Age-related osteoporosis without current pathological fracture: Secondary | ICD-10-CM | POA: Diagnosis not present

## 2021-06-24 DIAGNOSIS — Z1239 Encounter for other screening for malignant neoplasm of breast: Secondary | ICD-10-CM | POA: Diagnosis not present

## 2021-06-24 DIAGNOSIS — Z0000 Encounter for general adult medical examination without abnormal findings: Secondary | ICD-10-CM

## 2021-06-24 NOTE — Patient Instructions (Signed)

## 2021-06-24 NOTE — Progress Notes (Signed)
? ?Subjective:  ? Erin Lopez is a 71 y.o. female who presents for Medicare Annual (Subsequent) preventive examination. ? ?Review of Systems    ?I connected with  Erin Lopez on 06/24/21 by an audio only telemedicine application and verified that I am speaking with the correct person using two identifiers. ?  ?I discussed the limitations, risks, security and privacy concerns of performing an evaluation and management service by telephone and the availability of in person appointments. I also discussed with the patient that there may be a patient responsible charge related to this service. The patient expressed understanding and verbally consented to this telephonic visit. ? ?Location of Patient: home ?Location of Provider:office ? ?List any persons and their role that are participating in the visit with the patient.  ? ?Erin Lopez ?Erin Lopez ? ?   ?Objective:  ?  ?There were no vitals filed for this visit. ?There is no height or weight on file to calculate BMI. ? ? ?  01/14/2021  ? 12:57 PM 03/11/2020  ?  1:27 PM 03/02/2020  ?  3:20 PM 06/05/2018  ?  6:13 PM 05/14/2018  ?  6:53 PM 05/14/2018  ?  6:00 PM 05/14/2018  ?  1:43 PM  ?Advanced Directives  ?Does Patient Have a Medical Advance Directive? Yes Yes Yes Yes Yes No Yes  ?Type of Paramedic of [place];Living will [place];Living will Living will [place];Living will [place];Living will    ?Does patient want to make changes to medical advance directive?     No - Patient declined  No - Patient declined  ?Copy of [place] in Chart?  Yes - validated most recent copy scanned in chart (See row information)  No - copy requested No - copy requested    ?Would patient like information on creating a medical advance directive?    No - Patient declined No - Patient declined    ? ? ?Current Medications (verified) ?Outpatient Encounter Medications as of  06/24/2021  ?Medication Sig  ? acetaminophen (TYLENOL) 500 MG tablet Take 1,000 mg by mouth every 6 (six) hours as needed for mild pain or headache.   ? albuterol (VENTOLIN HFA) 108 (90 Base) MCG/ACT inhaler INHALE 2 PUFFS INTO THE LUNGS EVERY 4 HOURS AS NEEDED FOR WHEEZE OR FOR SHORTNESS OF BREATH  ? ALPRAZolam (XANAX) 0.5 MG tablet TAKE 1 TABLET BY MOUTH THREE TIMES A DAY AS NEEDED FOR ANXIETY  ? aspirin EC 81 MG EC tablet Take 1 tablet (81 mg total) by mouth daily.  ? atorvastatin (LIPITOR) 80 MG tablet TAKE 1 TABLET BY MOUTH DAILY AT 6 PM.  ? citalopram (CELEXA) 20 MG tablet Take 1 tablet (20 mg total) by mouth in the morning and at bedtime.  ? cyclobenzaprine (FLEXERIL) 10 MG tablet TAKE 1 TABLET BY MOUTH TWICE A DAY  ? doxycycline (VIBRA-TABS) 100 MG tablet Take 1 tablet (100 mg total) by mouth 2 (two) times daily.  ? doxycycline (VIBRAMYCIN) 50 MG capsule Take 50 mg by mouth daily.  ? famotidine (PEPCID) 40 MG tablet Take 1 tablet (40 mg total) by mouth daily.  ? fluticasone (FLONASE) 50 MCG/ACT nasal spray SPRAY 2 SPRAYS INTO EACH NOSTRIL EVERY DAY  ? hydrochlorothiazide (HYDRODIURIL) 12.5 MG tablet TAKE 1 TABLET BY MOUTH EVERY DAY  ? ipratropium-albuterol (DUONEB) 0.5-2.5 (3) MG/3ML SOLN TAKE 3 MLS BY NEBULIZATION 3 (THREE) TIMES DAILY AS NEEDED.  ? lamoTRIgine (LAMICTAL) 100  MG tablet Take 3 tablets (300 mg total) by mouth at bedtime.  ? losartan (COZAAR) 25 MG tablet TAKE 1 TABLET BY MOUTH EVERY DAY  ? Magnesium 250 MG TABS Take 250 mg by mouth daily.   ? metoprolol succinate (TOPROL-XL) 100 MG 24 hr tablet Take 1 tablet (100 mg total) by mouth daily. Take with or immediately following a meal.  ? montelukast (SINGULAIR) 10 MG tablet TAKE 1 TABLET BY MOUTH EVERY DAY IN THE EVENING  ? Multiple Vitamin (MULTI-VITAMINS) TABS Take 1 tablet by mouth daily.   ? nitroGLYCERIN (NITROSTAT) 0.4 MG SL tablet Place 1 tablet (0.4 mg total) under the tongue every 5 (five) minutes x 3 doses as needed for chest pain.  ?  oxyCODONE-acetaminophen (PERCOCET/ROXICET) 5-325 MG tablet Take 1 tablet by mouth every 6 (six) hours as needed for severe pain.  ? pantoprazole (PROTONIX) 40 MG tablet TAKE 1 TABLET BY MOUTH EVERY DAY  ? polyethylene glycol powder (GLYCOLAX/MIRALAX) powder TAKE 17 G BY MOUTH DAILY.  ? potassium citrate (UROCIT-K) 10 MEQ (1080 MG) SR tablet Take 10 mEq by mouth 2 (two) times a day.  ? Propylene Glycol 0.6 % SOLN Apply 1 drop to eye 2 (two) times daily.  ? tamsulosin (FLOMAX) 0.4 MG CAPS capsule Take 0.4 mg by mouth daily.  ? Tiotropium Bromide-Olodaterol (STIOLTO RESPIMAT) 2.5-2.5 MCG/ACT AERS Inhale 2 puffs into the lungs daily.  ? Tiotropium Bromide-Olodaterol (STIOLTO RESPIMAT) 2.5-2.5 MCG/ACT AERS INHALE 2 PUFFS BY MOUTH INTO THE LUNGS DAILY  ? triamcinolone cream (KENALOG) 0.1 % Apply 1 application topically daily.  ? zolpidem (AMBIEN) 10 MG tablet TAKE 1 TABLET BY MOUTH EVERYDAY AT BEDTIME  ? [DISCONTINUED] montelukast (SINGULAIR) 10 MG tablet TAKE 1 TABLET BY MOUTH EVERY DAY IN THE EVENING  ? ?No facility-administered encounter medications on file as of 06/24/2021.  ? ? ?Allergies (verified) ?Ceftin [cefuroxime axetil], Ciprofloxacin, Fosamax [alendronate sodium], Morphine and related, Oxycodone, Prednisone, and Seroquel [quetiapine]  ? ?History: ?Past Medical History:  ?Diagnosis Date  ? Alcoholism in remission [facility])   ? states last alcohol 11 yrs ago  ? Anemia   ? Anxiety   ? Bipolar 1 disorder ([place])   ? Admission for mania x 2 (most recent 11/2017)  ? Cervical spondylosis without myelopathy 12/19/2013  ? MRI 02/2014: multilevel DDD/spondylosis, not much change compared to prior MRI.  Pt not in favor of invasive therapy for her neck as of 04/2014.  ? Cholelithiasis   ? COPD (chronic obstructive pulmonary disease) ([place])   ? Moderate: anoro started 04/2017 by pulm, pt symptomatically improved and PFTs stable at f/u 06/2017.  ? Coronary artery spasm ([place]) 07/11/2018  ? nitrates=HA. Amlodipine=intol swelling.   Changed to coreg 09/03/18 by cardiologist.  ? Depression   ? Dilutional hyponatremia   ? Endometritis 05/2017  ? possible; empiric tx with Flagyl by GYN.  ? Environmental allergies   ? Fibromyalgia   ? GERD (gastroesophageal reflux disease)   ? Hepatic steatosis 03/2019  ? u/s abd  ? Herpes zoster 08/07/2017  ? L scapular region  ? Hiatal hernia   ? History of adenomatous polyp of colon 04/2008; 08/2014  ? No high grade dysplasia: recall 5 yrs ([name], [name])  ? History of basal cell carcinoma excision   ? NOSE  ? History of Helicobacter pylori infection 04/2008  ? +gastric biopsy (gastritis but no metaplasia, dysplasia, or malignancy identified)  ? History of kidney stones   ? History of TIA (transient ischemic attack)   ?  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 Hyperlipidemia   ? statin started after her NSTEMI: goal  LDL <70.  ? Hypertension   ? Internal hemorrhoids   ? Ischemic cardiomyopathy 05/2018  ? Echo EF 45-50% in context of NSTEMI from CA spasm. // Echo 8/21: 55-60, mild LVH, normal RVSF, RVSP 40, trivial MR  ? Left foot pain   ? Hallux deformity+adhesive capsulitis+ hammertoe:  Severe structural bunion deformity with hallux interphalangeus and severe arthritis of the second MPJ left foot--surgical repair/osteotomies by [name] 04/2017.  ? Leg pain   ? ABIs 11/2019: normal   ? Memory loss   ? Abnl MRI brain and CT brain c/w chronic microvascular ischemia.  Repeat MRI brain 02/2014 stable (Dr. Mayra Reel with no plan to f/u with neuro as of 04/2014)  ? NSTEMI (non-ST elevated myocardial infarction) ([place]) 05/2018  ? Echo EF 45-50% in context of NSTEMI from CA spasm.// Myoview 8/21: EF 66, no ischemia, low risk  ? Osteoporosis 05/2010  ? 2012 'penia; 06/2015 'porosis:  prolia.  DEXA 09/2017 T-score -2.2 femur neck.  As of 11/2018 we're restarting prolia soon and will check next DEXA 1 yr from her next prolia injection.  ? Pelvic floor dysfunction   ? [facility] (Dr.  Louis Meckel)  ? Pneumonia   ? Postmenopausal vaginal bleeding 05/2017  ? x 1 small episode; GYN attempted endo bx but unable to penetrate cervix due to severe cerv stenosis (due to postmenopausal state + hx of LEEP).

## 2021-06-24 NOTE — Telephone Encounter (Signed)
? ?  Primary Cardiologist: Fransico Him, MD ? ?Chart reviewed as part of pre-operative protocol coverage. Given past medical history and time since last visit, based on ACC/AHA guidelines, Erin Lopez would be at acceptable risk for the planned procedure without further cardiovascular testing.  ? ?Her aspirin may be held for 5 to 7 days prior to her surgery.  Please resume as soon as hemostasis is achieved. ? ?I will route this recommendation to the requesting party via Epic fax function and remove from pre-op pool. ? ?Please call with questions. ? ?Jossie Ng. Lada Fulbright NP-C ? ?  ?06/24/2021, 8:15 AM ?[place] ?[address] ?Office [phone] Fax [phone] ? ? ? ? ?

## 2021-06-29 ENCOUNTER — Encounter (HOSPITAL_BASED_OUTPATIENT_CLINIC_OR_DEPARTMENT_OTHER): Payer: Self-pay | Admitting: Orthopedic Surgery

## 2021-06-29 ENCOUNTER — Other Ambulatory Visit: Payer: Self-pay

## 2021-07-01 ENCOUNTER — Encounter (HOSPITAL_BASED_OUTPATIENT_CLINIC_OR_DEPARTMENT_OTHER)
Admission: RE | Admit: 2021-07-01 | Discharge: 2021-07-01 | Disposition: A | Discharge status: Home / Self Care | Payer: Medicare Other | Source: Ambulatory Visit | Attending: Orthopedic Surgery | Admitting: Orthopedic Surgery

## 2021-07-01 DIAGNOSIS — Z01812 Encounter for preprocedural laboratory examination: Secondary | ICD-10-CM | POA: Diagnosis not present

## 2021-07-01 DIAGNOSIS — H25813 Combined forms of age-related cataract, bilateral: Secondary | ICD-10-CM | POA: Diagnosis not present

## 2021-07-01 LAB — BASIC METABOLIC PANEL
Anion gap: 7 (ref 5–15)
BUN: 11 mg/dL (ref 8–23)
CO2: 27 mmol/L (ref 22–32)
Calcium: 9.5 mg/dL (ref 8.9–10.3)
Chloride: 102 mmol/L (ref 98–111)
Creatinine, Ser: 0.95 mg/dL (ref 0.44–1.00)
GFR, Estimated: >60 mL/min (ref >60)
Glucose, Bld: 101 mg/dL — ABNORMAL HIGH (ref 70–99)
Potassium: 3.8 mmol/L (ref 3.5–5.1)
Sodium: 136 mmol/L (ref 135–145)

## 2021-07-01 NOTE — Progress Notes (Signed)

## 2021-07-04 ENCOUNTER — Other Ambulatory Visit: Payer: Self-pay | Admitting: Family Medicine

## 2021-07-08 ENCOUNTER — Encounter (HOSPITAL_BASED_OUTPATIENT_CLINIC_OR_DEPARTMENT_OTHER): Payer: Self-pay | Admitting: Orthopedic Surgery

## 2021-07-08 ENCOUNTER — Encounter (HOSPITAL_BASED_OUTPATIENT_CLINIC_OR_DEPARTMENT_OTHER)
Admission: RE | Disposition: A | Discharge status: Home / Self Care | Payer: Self-pay | Source: Home / Self Care | Attending: Orthopedic Surgery

## 2021-07-08 ENCOUNTER — Ambulatory Visit (HOSPITAL_BASED_OUTPATIENT_CLINIC_OR_DEPARTMENT_OTHER)
Admission: RE | Admit: 2021-07-08 | Discharge: 2021-07-08 | Disposition: A | Discharge status: Home / Self Care | Payer: Medicare Other | Attending: Orthopedic Surgery | Admitting: Orthopedic Surgery

## 2021-07-08 ENCOUNTER — Ambulatory Visit (HOSPITAL_BASED_OUTPATIENT_CLINIC_OR_DEPARTMENT_OTHER): Payer: Medicare Other

## 2021-07-08 ENCOUNTER — Ambulatory Visit (HOSPITAL_BASED_OUTPATIENT_CLINIC_OR_DEPARTMENT_OTHER): Payer: Medicare Other | Admitting: Certified Registered"

## 2021-07-08 ENCOUNTER — Other Ambulatory Visit: Payer: Self-pay

## 2021-07-08 DIAGNOSIS — K219 Gastro-esophageal reflux disease without esophagitis: Secondary | ICD-10-CM | POA: Insufficient documentation

## 2021-07-08 DIAGNOSIS — Y793 Surgical instruments, materials and orthopedic devices (including sutures) associated with adverse incidents: Secondary | ICD-10-CM | POA: Diagnosis not present

## 2021-07-08 DIAGNOSIS — I10 Essential (primary) hypertension: Secondary | ICD-10-CM | POA: Diagnosis not present

## 2021-07-08 DIAGNOSIS — Z8673 Personal history of transient ischemic attack (TIA), and cerebral infarction without residual deficits: Secondary | ICD-10-CM | POA: Diagnosis not present

## 2021-07-08 DIAGNOSIS — M2042 Other hammer toe(s) (acquired), left foot: Secondary | ICD-10-CM | POA: Insufficient documentation

## 2021-07-08 DIAGNOSIS — F319 Bipolar disorder, unspecified: Secondary | ICD-10-CM | POA: Insufficient documentation

## 2021-07-08 DIAGNOSIS — Z87891 Personal history of nicotine dependence: Secondary | ICD-10-CM | POA: Insufficient documentation

## 2021-07-08 DIAGNOSIS — F419 Anxiety disorder, unspecified: Secondary | ICD-10-CM | POA: Diagnosis not present

## 2021-07-08 DIAGNOSIS — T8484XA Pain due to internal orthopedic prosthetic devices, implants and grafts, initial encounter: Secondary | ICD-10-CM | POA: Insufficient documentation

## 2021-07-08 DIAGNOSIS — N289 Disorder of kidney and ureter, unspecified: Secondary | ICD-10-CM | POA: Insufficient documentation

## 2021-07-08 DIAGNOSIS — I2510 Atherosclerotic heart disease of native coronary artery without angina pectoris: Secondary | ICD-10-CM | POA: Diagnosis not present

## 2021-07-08 DIAGNOSIS — K449 Diaphragmatic hernia without obstruction or gangrene: Secondary | ICD-10-CM | POA: Diagnosis not present

## 2021-07-08 DIAGNOSIS — J449 Chronic obstructive pulmonary disease, unspecified: Secondary | ICD-10-CM | POA: Insufficient documentation

## 2021-07-08 DIAGNOSIS — M2022 Hallux rigidus, left foot: Secondary | ICD-10-CM | POA: Diagnosis not present

## 2021-07-08 DIAGNOSIS — I252 Old myocardial infarction: Secondary | ICD-10-CM | POA: Diagnosis not present

## 2021-07-08 DIAGNOSIS — M21612 Bunion of left foot: Secondary | ICD-10-CM

## 2021-07-08 DIAGNOSIS — M797 Fibromyalgia: Secondary | ICD-10-CM | POA: Diagnosis not present

## 2021-07-08 DIAGNOSIS — M24575 Contracture, left foot: Secondary | ICD-10-CM | POA: Diagnosis not present

## 2021-07-08 DIAGNOSIS — M898X7 Other specified disorders of bone, ankle and foot: Secondary | ICD-10-CM | POA: Diagnosis not present

## 2021-07-08 DIAGNOSIS — G8918 Other acute postprocedural pain: Secondary | ICD-10-CM | POA: Diagnosis not present

## 2021-07-08 HISTORY — PX: HAMMER TOE SURGERY: SHX385

## 2021-07-08 HISTORY — PX: ARTHRODESIS METATARSALPHALANGEAL JOINT (MTPJ): SHX6566

## 2021-07-08 HISTORY — PX: HARDWARE REMOVAL: SHX979

## 2021-07-08 SURGERY — FUSION, JOINT, GREAT TOE
Anesthesia: General | Site: Toe | Laterality: Left

## 2021-07-08 MED ORDER — PROPOFOL 10 MG/ML IV BOLUS
INTRAVENOUS | Status: DC | PRN
  Administered 2021-07-08: 150 mg via INTRAVENOUS

## 2021-07-08 MED ORDER — SODIUM CHLORIDE 0.9 % IV SOLN
INTRAVENOUS | Status: DC
Start: 2021-07-08 — End: 2021-07-08

## 2021-07-08 MED ORDER — OXYCODONE HCL 5 MG PO TABS: 5.0000 mg | ORAL_TABLET | Freq: Four times a day (QID) | ORAL | 0 refills | Status: AC | PRN

## 2021-07-08 MED ORDER — CLONIDINE HCL (ANALGESIA) 100 MCG/ML EP SOLN
EPIDURAL | Status: DC | PRN
  Administered 2021-07-08: 67 ug
  Administered 2021-07-08: 33 ug

## 2021-07-08 MED ORDER — BUPIVACAINE-EPINEPHRINE (PF) 0.5% -1:200000 IJ SOLN
INTRAMUSCULAR | Status: DC | PRN
  Administered 2021-07-08: 10 mL via PERINEURAL
  Administered 2021-07-08: 20 mL via PERINEURAL

## 2021-07-08 MED ORDER — CEFAZOLIN SODIUM-DEXTROSE 2-4 GM/100ML-% IV SOLN
INTRAVENOUS | Status: AC
  Filled 2021-07-08: qty 100

## 2021-07-08 MED ORDER — FENTANYL CITRATE (PF) 100 MCG/2ML IJ SOLN
100.0000 ug | Freq: Once | INTRAMUSCULAR | Status: AC
  Administered 2021-07-08: 50 ug via INTRAVENOUS

## 2021-07-08 MED ORDER — FENTANYL CITRATE (PF) 100 MCG/2ML IJ SOLN
25.0000 ug | INTRAMUSCULAR | Status: DC | PRN
  Administered 2021-07-08 (×2): 50 ug via INTRAVENOUS

## 2021-07-08 MED ORDER — ACETAMINOPHEN 500 MG PO TABS
1000.0000 mg | ORAL_TABLET | Freq: Once | ORAL | Status: AC
Start: 2021-07-08 — End: 2021-07-08
  Administered 2021-07-08: 1000 mg via ORAL

## 2021-07-08 MED ORDER — 0.9 % SODIUM CHLORIDE (POUR BTL) OPTIME
TOPICAL | Status: DC | PRN
  Administered 2021-07-08: 1000 mL

## 2021-07-08 MED ORDER — VANCOMYCIN HCL 500 MG IV SOLR
INTRAVENOUS | Status: AC
  Filled 2021-07-08: qty 10

## 2021-07-08 MED ORDER — LIDOCAINE 2% (20 MG/ML) 5 ML SYRINGE
INTRAMUSCULAR | Status: DC | PRN
  Administered 2021-07-08: 60 mg via INTRAVENOUS

## 2021-07-08 MED ORDER — PROPOFOL 500 MG/50ML IV EMUL
INTRAVENOUS | Status: DC | PRN
  Administered 2021-07-08: 25 ug/kg/min via INTRAVENOUS

## 2021-07-08 MED ORDER — MIDAZOLAM HCL 2 MG/2ML IJ SOLN
2.0000 mg | Freq: Once | INTRAMUSCULAR | Status: AC
  Administered 2021-07-08: 2 mg via INTRAVENOUS

## 2021-07-08 MED ORDER — LACTATED RINGERS IV SOLN: INTRAVENOUS | Status: DC

## 2021-07-08 MED ORDER — CEFAZOLIN SODIUM-DEXTROSE 2-4 GM/100ML-% IV SOLN
2.0000 g | INTRAVENOUS | Status: AC
  Administered 2021-07-08: 2 g via INTRAVENOUS

## 2021-07-08 MED ORDER — ONDANSETRON HCL 4 MG/2ML IJ SOLN
INTRAMUSCULAR | Status: DC | PRN
  Administered 2021-07-08: 4 mg via INTRAVENOUS

## 2021-07-08 MED ORDER — DEXAMETHASONE SODIUM PHOSPHATE 10 MG/ML IJ SOLN
INTRAMUSCULAR | Status: DC | PRN
  Administered 2021-07-08: 4 mg via INTRAVENOUS

## 2021-07-08 MED ORDER — EPHEDRINE SULFATE (PRESSORS) 50 MG/ML IJ SOLN
INTRAMUSCULAR | Status: DC | PRN
  Administered 2021-07-08: 5 mg via INTRAVENOUS
  Administered 2021-07-08: 10 mg via INTRAVENOUS
  Administered 2021-07-08: 5 mg via INTRAVENOUS

## 2021-07-08 MED ORDER — OXYCODONE HCL 5 MG/5ML PO SOLN: 5.0000 mg | Freq: Once | ORAL | Status: DC | PRN

## 2021-07-08 MED ORDER — MIDAZOLAM HCL 2 MG/2ML IJ SOLN
INTRAMUSCULAR | Status: AC
  Filled 2021-07-08: qty 2

## 2021-07-08 MED ORDER — VANCOMYCIN HCL 500 MG IV SOLR
INTRAVENOUS | Status: DC | PRN
  Administered 2021-07-08: 500 mg

## 2021-07-08 MED ORDER — OXYCODONE HCL 5 MG PO TABS: 5.0000 mg | ORAL_TABLET | Freq: Once | ORAL | Status: DC | PRN

## 2021-07-08 MED ORDER — FENTANYL CITRATE (PF) 100 MCG/2ML IJ SOLN
INTRAMUSCULAR | Status: AC
  Filled 2021-07-08: qty 2

## 2021-07-08 MED ORDER — ACETAMINOPHEN 500 MG PO TABS
ORAL_TABLET | ORAL | Status: AC
  Filled 2021-07-08: qty 2

## 2021-07-08 SURGICAL SUPPLY — 89 items
APL PRP STRL LF DISP 70% ISPRP (MISCELLANEOUS) ×2
BANDAGE ESMARK 6X9 LF (GAUZE/BANDAGES/DRESSINGS) ×2 IMPLANT
BIT DRILL CAL 2.5 ST W/SLV (BIT) ×1 IMPLANT
BLADE AVERAGE 25X9 (BLADE) ×1 IMPLANT
BLADE LONG MED 25X9 (BLADE) IMPLANT
BLADE MICRO SAGITTAL (BLADE) IMPLANT
BLADE OSC/SAG .038X5.5 CUT EDG (BLADE) IMPLANT
BLADE SURG 15 STRL LF DISP TIS (BLADE) ×4 IMPLANT
BLADE SURG 15 STRL SS (BLADE) ×9
BNDG CMPR 9X4 STRL LF SNTH (GAUZE/BANDAGES/DRESSINGS)
BNDG CMPR 9X6 STRL LF SNTH (GAUZE/BANDAGES/DRESSINGS) ×2
BNDG COHESIVE 4X5 TAN ST LF (GAUZE/BANDAGES/DRESSINGS) ×2 IMPLANT
BNDG COHESIVE 6X5 TAN ST LF (GAUZE/BANDAGES/DRESSINGS) IMPLANT
BNDG CONFORM 2 STRL LF (GAUZE/BANDAGES/DRESSINGS) IMPLANT
BNDG CONFORM 3 STRL LF (GAUZE/BANDAGES/DRESSINGS) ×3 IMPLANT
BNDG ELASTIC 4X5.8 VLCR STR LF (GAUZE/BANDAGES/DRESSINGS) ×3 IMPLANT
BNDG ESMARK 4X9 LF (GAUZE/BANDAGES/DRESSINGS) IMPLANT
BNDG ESMARK 6X9 LF (GAUZE/BANDAGES/DRESSINGS) ×3
BOOT STEPPER DURA LG (SOFTGOODS) IMPLANT
BOOT STEPPER DURA MED (SOFTGOODS) ×1 IMPLANT
BOOT STEPPER DURA SM (SOFTGOODS) IMPLANT
BOOT STEPPER DURA XLG (SOFTGOODS) IMPLANT
CHLORAPREP W/TINT 26 (MISCELLANEOUS) ×3 IMPLANT
COVER BACK TABLE 60X90IN (DRAPES) ×3 IMPLANT
CUFF TOURN SGL QUICK 34 (TOURNIQUET CUFF)
CUFF TRNQT CYL 34X4.125X (TOURNIQUET CUFF) IMPLANT
DRAPE EXTREMITY T 121X128X90 (DISPOSABLE) ×3 IMPLANT
DRAPE OEC MINIVIEW 54X84 (DRAPES) ×3 IMPLANT
DRAPE U-SHAPE 47X51 STRL (DRAPES) ×3 IMPLANT
DRSG MEPITEL 4X7.2 (GAUZE/BANDAGES/DRESSINGS) ×3 IMPLANT
DRSG PAD ABDOMINAL 8X10 ST (GAUZE/BANDAGES/DRESSINGS) ×3 IMPLANT
ELECT REM PT RETURN 9FT ADLT (ELECTROSURGICAL) ×3
ELECTRODE REM PT RTRN 9FT ADLT (ELECTROSURGICAL) ×2 IMPLANT
GAUZE SPONGE 4X4 12PLY STRL (GAUZE/BANDAGES/DRESSINGS) ×3 IMPLANT
GLOVE SRG 8 PF TXTR STRL LF DI (GLOVE) ×4 IMPLANT
GLOVE SURG ENC MOIS LTX SZ8 (GLOVE) ×3 IMPLANT
GLOVE SURG POLYISO LF SZ7 (GLOVE) ×1 IMPLANT
GLOVE SURG UNDER POLY LF SZ7 (GLOVE) ×2 IMPLANT
GLOVE SURG UNDER POLY LF SZ8 (GLOVE) ×3
GOWN STRL REUS W/ TWL LRG LVL3 (GOWN DISPOSABLE) ×2 IMPLANT
GOWN STRL REUS W/ TWL XL LVL3 (GOWN DISPOSABLE) ×4 IMPLANT
GOWN STRL REUS W/TWL LRG LVL3 (GOWN DISPOSABLE) ×3
GOWN STRL REUS W/TWL XL LVL3 (GOWN DISPOSABLE) ×3
K-WIRE .054X4 (WIRE) IMPLANT
K-WIRE ACE 1.6X6 (WIRE) ×3
KIT INSTRUMENT MPJ STRATUM (KITS) ×1 IMPLANT
KWIRE ACE 1.6X6 (WIRE) IMPLANT
NDL HYPO 25X1 1.5 SAFETY (NEEDLE) IMPLANT
NEEDLE HYPO 22GX1.5 SAFETY (NEEDLE) IMPLANT
NEEDLE HYPO 25X1 1.5 SAFETY (NEEDLE) IMPLANT
NS IRRIG 1000ML POUR BTL (IV SOLUTION) ×3 IMPLANT
PACK BASIN DAY SURGERY FS (CUSTOM PROCEDURE TRAY) ×3 IMPLANT
PAD CAST 4YDX4 CTTN HI CHSV (CAST SUPPLIES) ×2 IMPLANT
PADDING CAST ABS 4INX4YD NS (CAST SUPPLIES)
PADDING CAST ABS COTTON 4X4 ST (CAST SUPPLIES) IMPLANT
PADDING CAST COTTON 4X4 STRL (CAST SUPPLIES) ×3
PADDING CAST COTTON 6X4 STRL (CAST SUPPLIES) IMPLANT
PENCIL SMOKE EVACUATOR (MISCELLANEOUS) ×3 IMPLANT
PLATE MPJ 1ST STRM SM 0D LT (Plate) ×1 IMPLANT
SANITIZER HAND PURELL 535ML FO (MISCELLANEOUS) ×3 IMPLANT
SCREW  STRATUM NL LP ST 3.5X18 (Screw) ×3 IMPLANT
SCREW LOCK STRATUM 3.5X16 (Screw) ×1 IMPLANT
SCREW LOCK STRATUM 3.5X20 (Screw) ×1 IMPLANT
SCREW NL LP STRATUM 3.5X16 (Screw) ×1 IMPLANT
SCREW STRATUM NL LP ST 3.5X18 (Screw) IMPLANT
SCREW STRM LOCK 3.5X14 (Screw) ×1 IMPLANT
SCREW STRM NL LP 3.5X20 (Screw) ×1 IMPLANT
SHEET MEDIUM DRAPE 40X70 STRL (DRAPES) ×3 IMPLANT
SLEEVE SCD COMPRESS KNEE MED (STOCKING) ×3 IMPLANT
SPLINT FAST PLASTER 5X30 (CAST SUPPLIES)
SPLINT PLASTER CAST FAST 5X30 (CAST SUPPLIES) IMPLANT
SPONGE SURGIFOAM ABS GEL 12-7 (HEMOSTASIS) IMPLANT
SPONGE T-LAP 18X18 ~~LOC~~+RFID (SPONGE) ×3 IMPLANT
STOCKINETTE 6  STRL (DRAPES) ×3
STOCKINETTE 6 STRL (DRAPES) ×2 IMPLANT
SUCTION FRAZIER HANDLE 10FR (MISCELLANEOUS) ×3
SUCTION TUBE FRAZIER 10FR DISP (MISCELLANEOUS) ×2 IMPLANT
SUT ETHILON 2 0 FS 18 (SUTURE) IMPLANT
SUT ETHILON 3 0 PS 1 (SUTURE) ×4 IMPLANT
SUT MNCRL AB 3-0 PS2 18 (SUTURE) ×3 IMPLANT
SUT VIC AB 2-0 SH 27 (SUTURE)
SUT VIC AB 2-0 SH 27XBRD (SUTURE) ×2 IMPLANT
SUT VICRYL 0 SH 27 (SUTURE) IMPLANT
SUT VICRYL 0 UR6 27IN ABS (SUTURE) ×1 IMPLANT
SYR BULB EAR ULCER 3OZ GRN STR (SYRINGE) ×3 IMPLANT
SYR CONTROL 10ML LL (SYRINGE) IMPLANT
TOWEL GREEN STERILE FF (TOWEL DISPOSABLE) ×6 IMPLANT
TUBE CONNECTING 20X1/4 (TUBING) ×3 IMPLANT
UNDERPAD 30X36 HEAVY ABSORB (UNDERPADS AND DIAPERS) ×3 IMPLANT

## 2021-07-08 NOTE — Op Note (Signed)
07/08/2021 ? ?1:25 PM ? ?PATIENT:  Erin Lopez  71 y.o. female ? ?PRE-OPERATIVE DIAGNOSIS: 1.  Left hallux rigidus ?2.  Painful hardware left hallux proximal phalanx, first metatarsal and second metatarsal ?3.  Left second fixed hammertoe deformity ?4.  Left third, fourth and fifth flexible hammertoe deformities ? ?POST-OPERATIVE DIAGNOSIS: Same ? ?Procedure(s): ?1.  Left first metatarsal removal of deep implant ?2.  Left hallux proximal phalanx removal of deep implant ?3.  Left hallux metatarsophalangeal joint arthrodesis ?4.  Removal of deep implant from the left second metatarsal ?5.  Left second metatarsal head excision ?6.  Left second hammertoe correction ?7.  Left third, fourth and fifth toe flexor digitorum longus tenotomies ?8.  AP, lateral and oblique radiographs of the left foot ? ?SURGEON:  Wylene Simmer, MD ? ?ASSISTANT: None ? ?ANESTHESIA:   General, regional ? ?EBL:  minimal  ? ?TOURNIQUET:   ?Total Tourniquet Time Documented: ?Thigh (Left) - 66 minutes ?Total: Thigh (Left) - 66 minutes ? ?COMPLICATIONS:  None apparent ? ?DISPOSITION:  Extubated, awake and stable to recovery. ? ?INDICATION FOR PROCEDURE: 71 year old female with a long history of left forefoot pain with a multiply operated foot.  She has painful hardware after bunion correction and has developed hallux rigidus.  She has painful hardware at the second MTP joint after Silastic joint replacement.  She also has a second hammertoe deformity that is fixed along with third through fifth hammertoe deformities that are flexible.  She has failed nonoperative treatment to date including activity modification, oral anti-inflammatories and shoewear modification.  She presents for revision left forefoot reconstruction.  The risks and benefits of the alternative treatment options have been discussed in detail.  The patient wishes to proceed with surgery and specifically understands risks of bleeding, infection, nerve damage, blood clots, need for  additional surgery, amputation and death.  ? ?PROCEDURE IN DETAIL:  After pre operative consent was obtained, and the correct operative site was identified, the patient was brought to the operating room and placed supine on the OR table.  Anesthesia was administered.  Pre-operative antibiotics were administered.  A surgical timeout was taken.  The left lower extremity was prepped and draped in standard sterile fashion with a tourniquet around the thigh.  The extremity was elevated and the tourniquet was inflated to 250 mmHg.  The previous dorsal incision was identified over the first MTP joint.  The incision was made and dissection carried sharply down through the subcutaneous tissues.  The extensor tendons were protected throughout the case.  The dorsal neck of the first metatarsal was exposed.  The prominent proximal screw was identified.  It was cleaned of all soft tissue and removed in its entirety.  The distal screw in the metatarsal neck was then identified.  Overgrown bone was removed with a rondure.  The screw was removed without difficulty.  The dorsal aspect of the proximal phalanx was then exposed.  The wire was identified within the proximal phalanx.  It was mobilized and cut at the cortical bone.  It was then pulled free from the distal hole and removed in its entirety.  The collateral ligaments were then released exposing the metatarsal head.  A concave reamer was used to remove the remaining articular cartilage and subchondral bone.  A convex reamer was used to remove the remaining articular cartilage and subchondral bone from the base of the proximal phalanx.  The wound was irrigated copiously.  A small drill bit was used to perforate the joint surfaces  leaving the resultant bone graft in place.  The joint was reduced and provisionally pinned.  AP and lateral radiographs confirmed appropriate reduction of the joint.  Simulated weightbearing examination showed appropriate alignment of the toe.  A size  small stratum plate was selected.  The template was pinned across the joint.  Radiographs confirmed appropriate position of the plate.  The template was removed and the plate impacted into position distally.  Distally the plate was secured to bone with bicortical locking and nonlocking screws.  Proximally the slotted hole was drilled.  The compression pin was inserted followed by the knot.  The joint was compressed appropriately.  The plate was then secured with nonlocking and locking screws.  The pin was removed and replaced with a bicortical nonlocking screw.  AP and lateral radiographs confirmed appropriate arthrodesis of the hallux MP joint in appropriate position and length of all hardware.  The wound was irrigated copiously and sprinkled with vancomycin powder.  Subcutaneous tissues were approximated with Monocryl.  Skin incision was closed with nylon. ? ?The flexor tendons were quite tight at the second, third, fourth and fifth toes.  Percutaneous tenotomy's were performed at all 4 toes releasing the flexor digitorum longus from the distal phalanx.  This allowed passive correction of all 4 toes. ? ?Attention was turned to the second toe.  A longitudinal incision was made over the MTP joint.  Dissection was carried sharply down through the subcutaneous tissues.  Extensive synovitis was noted at the second MTP joint.  The Silastic implant was identified.  It was removed with a rondure from the second metatarsal.  The second metatarsal head was essentially destroyed.  The head was exposed with subperiosteal dissection.  An oscillating saw was used to cut through the metatarsal neck beveling the cut appropriately.  The remnants of the metatarsal head were removed with a rondure.  A 0 Vicryl suture was passed through the joint capsule around the second MTP joint and tagged for later tying. ? ?Attention was turned to the second toe where a transverse incision was made over the PIP joint.  Dissection was carried  sharply down through the skin and extensor mechanism.  The head of the anxious was resected followed by the base of the middle phalanx.  The joint was reduced.  A pin was inserted across the IP joints.  The toe was then reduced relative to the second metatarsal.  The pin was then advanced across the second MTP joint and into the base of the second metatarsal.  AP, lateral and oblique radiographs confirmed appropriate position of the pin.  The pin was then bent and capped.  The remaining wounds were irrigated copiously and sprinkled with vancomycin powder.  Skin incisions were all closed with nylon.  Sterile dressings were applied followed by a compression wrap.  The tourniquet was released after application of the dressings.  The patient was awakened from anesthesia and transported to the recovery room in stable condition. ? ? ?FOLLOW UP PLAN: Weightbearing as tolerated in the cam boot.  Follow-up in the office in 2 weeks for suture removal.  Plan to pull the pins 6 weeks postop.  No indication for DVT prophylaxis in this ambulatory patient. ? ? ?RADIOGRAPHS: AP, lateral and oblique radiographs of the left foot are obtained intraoperatively.  These show interval arthrodesis of the hallux MP joint and removal of hardware from the first and second metatarsals and hallux proximal phalanx.  Metatarsal head excision is noted at the second metatarsal.  Hammertoe correction also noted second toe.  No acute injuries are noted. ? ? ?  ?

## 2021-07-08 NOTE — Anesthesia Procedure Notes (Signed)
Anesthesia Regional Block: Popliteal block  ? ?Pre-Anesthetic Checklist: , timeout performed,  Correct Patient, Correct Site, Correct Laterality,  Correct Procedure, Correct Position, site marked,  Risks and benefits discussed,  Pre-op evaluation,  At surgeon's request and post-op pain management ? ?Laterality: Left ? ?Prep: Maximum Sterile Barrier Precautions used, chloraprep     ?  ?Needles:  ?Injection technique: Single-shot ? ?Needle Type: Echogenic Stimulator Needle   ? ? ?Needle Length: 9cm  ?Needle Gauge: 22  ? ? ? ?Additional Needles: ? ? ?Procedures:,,,, ultrasound used (permanent image in chart),,    ?Narrative:  ?Start time: 07/08/2021 11:39 AM ?End time: 07/08/2021 11:42 AM ?Injection made incrementally with aspirations every 5 mL. ? ?Performed by: Personally  ?Anesthesiologist: Brennan Bailey, MD ? ?Additional Notes: ?Risks, benefits, and alternative discussed. Patient gave consent for procedure. Patient prepped and draped in sterile fashion. Sedation administered, patient remains easily responsive to voice. Relevant anatomy identified with ultrasound guidance. Local anesthetic given in 5cc increments with no signs or symptoms of intravascular injection. No pain or paraesthesias with injection. Patient monitored throughout procedure with signs of LAST or immediate complications. Tolerated well. Ultrasound image placed in chart.  ?Tawny Asal, MD ? ? ? ? ? ? ?

## 2021-07-08 NOTE — Discharge Instructions (Addendum)
Erin Simmer, MD ?[facility] ? ?Please read the following information regarding your care after surgery. ? ?Medications  ?You only need a prescription for the narcotic pain medicine (ex. oxycodone, Percocet, Norco).  All of the other medicines listed below are available over the counter. ?X Aleve 2 pills twice a day for the first 3 days after surgery. ?X acetominophen (Tylenol) 650 mg every 4-6 hours as you need for minor to moderate pain ?X oxycodone as prescribed for severe pain ? ?Narcotic pain medicine (ex. oxycodone, Percocet, Vicodin) will cause constipation.  To prevent this problem, take the following medicines while you are taking any pain medicine. ?X docusate sodium (Colace) 100 mg twice a day X senna (Senokot) 2 tablets twice a day ? ?Weight Bearing ?X Bear weight only on your operated foot in the cam boot. ? ?Cast / Splint / Dressing ?X Keep your splint, cast or dressing clean and dry.  Don?t put anything (coat hanger, pencil, etc) down inside of it.  If it gets damp, use a hair dryer on the cool setting to dry it.  If it gets soaked, call the office to schedule an appointment for a cast change. ? ?After your dressing, cast or splint is removed; you may shower, but do not soak or scrub the wound.  Allow the water to run over it, and then gently pat it dry. ? ?Swelling ?It is normal for you to have swelling where you had surgery.  To reduce swelling and pain, keep your toes above your nose for at least 3 days after surgery.  It may be necessary to keep your foot or leg elevated for several weeks.  If it hurts, it should be elevated. ? ?Follow Up ?Call my office at [phone] when you are discharged from the hospital or surgery center to schedule an appointment to be seen two weeks after surgery. ? ?Call my office at [phone] if you develop a fever >101.5? F, nausea, vomiting, bleeding from the surgical site or severe pain.   ?  ? ? ? ? ?Post Anesthesia Home Care Instructions ? ?Activity: ?Get  plenty of rest for the remainder of the day. A responsible individual must stay with you for 24 hours following the procedure.  ?For the next 24 hours, DO NOT: ?-Drive a car ?-Paediatric nurse ?-Drink alcoholic beverages ?-Take any medication unless instructed by your physician ?-Make any legal decisions or sign important papers. ? ?Meals: ?Start with liquid foods such as gelatin or soup. Progress to regular foods as tolerated. Avoid greasy, spicy, heavy foods. If nausea and/or vomiting occur, drink only clear liquids until the nausea and/or vomiting subsides. Call your physician if vomiting continues. ? ?Special Instructions/Symptoms: ?Your throat may feel dry or sore from the anesthesia or the breathing tube placed in your throat during surgery. If this causes discomfort, gargle with warm salt water. The discomfort should disappear within 24 hours. ? ?If you had a scopolamine patch placed behind your ear for the management of post- operative nausea and/or vomiting: ? ?1. The medication in the patch is effective for 72 hours, after which it should be removed.  Wrap patch in a tissue and discard in the trash. Wash hands thoroughly with soap and water. ?2. You may remove the patch earlier than 72 hours if you experience unpleasant side effects which may include dry mouth, dizziness or visual disturbances. ?3. Avoid touching the patch. Wash your hands with soap and water after contact with the patch. ?   Regional Anesthesia Blocks ? ?  1. Numbness or the inability to move the "blocked" extremity may last from 3-48 hours after placement. The length of time depends on the medication injected and your individual response to the medication. If the numbness is not going away after 48 hours, call your surgeon. ? ?2. The extremity that is blocked will need to be protected until the numbness is gone and the  Strength has returned. Because you cannot feel it, you will need to take extra care to avoid injury. Because it may be  weak, you may have difficulty moving it or using it. You may not know what position it is in without looking at it while the block is in effect. ? ?3. For blocks in the legs and feet, returning to weight bearing and walking needs to be done carefully. You will need to wait until the numbness is entirely gone and the strength has returned. You should be able to move your leg and foot normally before you try and bear weight or walk. You will need someone to be with you when you first try to ensure you do not fall and possibly risk injury. ? ?4. Bruising and tenderness at the needle site are common side effects and will resolve in a few days. ? ?5. Persistent numbness or new problems with movement should be communicated to the surgeon or the [place] [phone] [place] [phone]).  ?

## 2021-07-08 NOTE — Transfer of Care (Signed)
Immediate Anesthesia Transfer of Care Note ? ?Patient: Erin Lopez ? ?Procedure(s) Performed: Left hallux metatarsophalangeal joint arthrodesis (Left: Foot) ?Second metatarsal head excision; revision Second hammertoe correction; 2-5 percutaneus flexor tenotomies (Left: Toe) ?Removal of deep implants from hallux proximal phalanx and First and Second metatarsals (Left: Foot) ? ?Patient Location: PACU ? ?Anesthesia Type:GA combined with regional for post-op pain ? ?Level of Consciousness: awake ? ?Airway & Oxygen Therapy: Patient Spontanous Breathing and Patient connected to face mask oxygen ? ?Post-op Assessment: Report given to RN and Post -op Vital signs reviewed and stable ? ?Post vital signs: Reviewed and stable ? ?Last Vitals:  ?Vitals Value Taken Time  ?BP    ?Temp    ?Pulse    ?Resp 15 07/08/21 1325  ?SpO2    ?Vitals shown include unvalidated device data. ? ?Last Pain:  ?Vitals:  ? 07/08/21 1120  ?PainSc: 0-No pain  ?   ? ?Patients Stated Pain Goal: 3 (07/08/21 1120) ? ?Complications: No notable events documented. ?

## 2021-07-08 NOTE — H&P (Signed)
Erin Lopez is an 71 y.o. female.   ?Chief Complaint:  left foot pain ?HPI:  71 y/o female with extensive PMH c/o chronic left forefoot pain after multiple podiatric procedures.  He has signs and symptoms of left hallux rigidus with painful hardware of the 1st MT as well as recurrent 2nd hammertoe deformity and a painful 2nd MTPJ implant.  She has 3-5 hammertoes as well.  She has failed non op treatment and presents today for surgery. ? ?Past Medical History:  ?Diagnosis Date  ? Alcoholism in remission [facility])   ? states last alcohol 11 yrs ago  ? Anemia   ? Anxiety   ? Bipolar 1 disorder ([place])   ? Admission for mania x 2 (most recent 11/2017)  ? Cervical spondylosis without myelopathy 12/19/2013  ? MRI 02/2014: multilevel DDD/spondylosis, not much change compared to prior MRI.  Pt not in favor of invasive therapy for her neck as of 04/2014.  ? Cholelithiasis   ? COPD (chronic obstructive pulmonary disease) ([place])   ? Moderate: anoro started 04/2017 by pulm, pt symptomatically improved and PFTs stable at f/u 06/2017.  ? Coronary artery spasm ([place]) 07/11/2018  ? nitrates=HA. Amlodipine=intol swelling.  Changed to coreg 09/03/18 by cardiologist.  ? Depression   ? Dilutional hyponatremia   ? Endometritis 05/2017  ? possible; empiric tx with Flagyl by GYN.  ? Environmental allergies   ? Fibromyalgia   ? GERD (gastroesophageal reflux disease)   ? Hepatic steatosis 03/2019  ? u/s abd  ? Herpes zoster 08/07/2017  ? L scapular region  ? Hiatal hernia   ? History of adenomatous polyp of colon 04/2008; 08/2014  ? No high grade dysplasia: recall 5 yrs ([name], [name])  ? History of basal cell carcinoma excision   ? NOSE  ? History of Helicobacter pylori infection 04/2008  ? +gastric biopsy (gastritis but no metaplasia, dysplasia, or malignancy identified)  ? History of kidney stones   ? History of TIA (transient ischemic attack)   ? 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 Hyperlipidemia   ?  statin started after her NSTEMI: goal  LDL <70.  ? Hypertension   ? Internal hemorrhoids   ? Ischemic cardiomyopathy 05/2018  ? Echo EF 45-50% in context of NSTEMI from CA spasm. // Echo 8/21: 55-60, mild LVH, normal RVSF, RVSP 40, trivial MR  ? Left foot pain   ? Hallux deformity+adhesive capsulitis+ hammertoe:  Severe structural bunion deformity with hallux interphalangeus and severe arthritis of the second MPJ left foot--surgical repair/osteotomies by [name] 04/2017.  ? Leg pain   ? ABIs 11/2019: normal   ? Memory loss   ? Abnl MRI brain and CT brain c/w chronic microvascular ischemia.  Repeat MRI brain 02/2014 stable (Dr. Mayra Reel with no plan to f/u with neuro as of 04/2014)  ? NSTEMI (non-ST elevated myocardial infarction) ([place]) 05/2018  ? Echo EF 45-50% in context of NSTEMI from CA spasm.// Myoview 8/21: EF 66, no ischemia, low risk  ? Osteoporosis 05/2010  ? 2012 'penia; 06/2015 'porosis:  prolia.  DEXA 09/2017 T-score -2.2 femur neck.  As of 11/2018 we're restarting prolia soon and will check next DEXA 1 yr from her next prolia injection.  ? Pelvic floor dysfunction   ? [facility] ([name])  ? Pneumonia   ? Postmenopausal vaginal bleeding 05/2017  ? x 1 small episode; GYN attempted endo bx but unable to penetrate cervix due to severe  cerv stenosis (due to postmenopausal state + hx of LEEP).  Endo u/s showed thin endo lining.  Per GYN---no evidence of endomet pathology on w/u---obs as of 07/2017.  ? Prediabetes 06/2016  ? Fasting gluc 105; HbA1c at that time was 6.0%.  A1c 6.2% 12/2017.  A1c 5.7% Feb 2023.  ? Recurrent kidney stones 08/2013  ? Left ureteral calculus: Perc nephr--cystoscopy w/ureteroscopy + laser for stone removal.  Residual asymptomatic left renal nephrolithiasis <19m present post-procedure.  Right sided hydronephrosis-persistent (on u/s)--urol ordered CT to further eval 08/2016: 1.6 cm nonobstructing stone lower pole left kidney, no hydro, plan for PCN extraction ([facility]: Lambert Rhonda W  ? Shortness  of breath   ? Sprain of neck 12/19/2013  ? Stroke ([facility]   ? TIA  ? TMJ (temporomandibular joint disorder)   ? USES  MOUTH GUARD  ? Toe fracture, left 12/2017  ? 2nd toe prox phalanx (immobilization, Dr. RZadie Rhine  ? Tubular adenoma of colon   ? Vitamin D deficiency 05/2011  ? Weak urinary stream 07/2016  ? with elevated PVR and mild left hydronephrosis secondary to this ([facility] started flomax 0.'4mg'$  qd for this).  ? ? ?Past Surgical History:  ?Procedure Laterality Date  ? ABIs  11/29/2019  ? NORMAL  ? ANTERIOR CERVICAL DECOMP/DISCECTOMY FUSION  06/30/2009  ? C3 -- C5  AND EXPLORATION OF FUSION C5-7 W/  PLATE REMOVAL  ? BACK SURGERY    ? BUNIONECTOMY Left   ? CARDIOVASCULAR STRESS TEST  12/03/2019  ? myoc perf im: NORMAL  ? CERVICAL FUSION  2002  ? anterior C5 -- C7  ? CERVICAL SPINE SURGERY  08/27/1999  ?   C6 -- T1  LAMINECTOMY/  DISKECTOMY  ? CF[place] ? CHOLECYSTECTOMY N/A 03/06/2020  ? Procedure: LAPAROSCOPIC CHOLECYSTECTOMY;  Surgeon: SFelicie Morn MD;  [place] [facility]  Service: General;  Laterality: N/A;  ? COLONOSCOPY W/ POLYPECTOMY  04/2008;08/2014  ? 2016 tubular adenoma x 1; +diverticulosis and int/ext hemorrhoids.  06/2020 adenoma, recall 5 yrs.  ? CYSTOSCOPY WITH URETEROSCOPY AND STENT PLACEMENT Left 08/28/2013  ? Procedure: CYSTOSCOPY, RGP,  WITH URETEROSCOPY AND STENT REMOVAL;  Surgeon: DBernestine Amass MD;  [facility]  Service: Urology;  Laterality: Left;  ? DEXA  09/2017  ? T-score -2.2 femur neck: improved compared to 06/2015.  ? ESOPHAGOGASTRODUODENOSCOPY  2010; 08/2014  ? 2016 +Candidal esophagitis; Mild chronic gastritis w/intestinal metaplasia--NEG H pylori, neg for eosinophilic esoph. 31/6010esoph candidiasis (diflucan x 20d), o/w normal.  ? HOLMIUM LASER APPLICATION Left 093/23/5573 ? Procedure: HOLMIUM LASER APPLICATION;  Surgeon: DBernestine Amass MD;  [facility]  Service: Urology;  Laterality: Left;  ? JOINT  REPLACEMENT    ? LEFT HEART CATH AND CORONARY ANGIOGRAPHY N/A 05/15/2018  ? Evidence for spasm in distal LAD.  Mod RCA atherosclerosis, o/w no CAD.  Procedure: LEFT HEART CATH AND CORONARY ANGIOGRAPHY;  Surgeon: HLeonie Man MD;  [facility];  Service: Cardiovascular;  Laterality: N/A;  ? NEGATIVE SLEEP STUDY  04/01/2007  ? NEPHROLITHOTOMY Left 08/05/2013  ? Procedure: NEPHROLITHOTOMY PERCUTANEOUS;  Surgeon: DBernestine Amass MD;  Location: [facility];  Service: Urology;  Laterality: Left;  ? POLYSOMNOGRAM  01/2019  ? NORMAL  ? POLYSOMNOGRAPHY  10/25/2018  ? Dr. TDion Saucierdue to pt inadequat sleep time (83 min).  ? TONSILLECTOMY AND ADENOIDECTOMY  1975  ? TOTAL KNEE ARTHROPLASTY Right 2003  ? TRANSTHORACIC ECHOCARDIOGRAM  05/2018; 12/03/2019  ? (  after MI secondary to arterial spasm): EF 45-50%; severe hypokinesis of apical anterolateral, inferolateral , anterior, and inferior LV myocardium. 11/2019 EF 55-60%, valves fine  ? ULTRASOUND EXAM,PELVIC COMPLETE ([facility] HX)  06/25/2015  ? NORMAL (done by GYN)  ? ? ?Family History  ?Problem Relation Age of Onset  ? Alcohol abuse Mother   ? Arthritis Mother   ? Hypertension Mother   ? Hyperlipidemia Mother   ? Diabetes Mother   ? Parkinsonism Mother   ? Breast cancer Mother   ? Prostate cancer Father   ? Hypertension Father   ? Hyperlipidemia Father   ? Diabetes Father   ? Heart disease Father   ? Esophageal cancer Sister   ? Stomach cancer Sister   ? Colon polyps Sister   ? Colon polyps Brother   ? Heart disease Brother   ?     x 2  ? Irritable bowel syndrome Sister   ? Colon cancer Neg Hx   ? ?Social History:  reports that she quit smoking about 27 years ago. Her smoking use included cigarettes. She has a 33.00 pack-year smoking history. She has never used smokeless tobacco. She reports that she does not drink alcohol and does not use drugs. ? ?Allergies:  ?Allergies  ?Allergen Reactions  ? Ceftin [Cefuroxime Axetil] Anaphylaxis  ?  Swelling  of eyes and tongue  ? Ciprofloxacin Anaphylaxis  ?  Difficulty breathing and generalized swelling  ? Fosamax [Alendronate Sodium] Diarrhea  ? Morphine And Related Other (See Comments)  ?  Hallucinations/ disor

## 2021-07-08 NOTE — Anesthesia Preprocedure Evaluation (Addendum)
Anesthesia Evaluation  ?Patient identified by MRN, date of birth, ID band ?Patient awake ? ? ? ?Reviewed: ?Allergy & Precautions, NPO status , Patient's Chart, lab work & pertinent test results, reviewed documented beta blocker date and time  ? ?History of Anesthesia Complications ?Negative for: history of anesthetic complications ? ?Airway ?Mallampati: II ? ?TM Distance: >3 FB ?Neck ROM: Full ? ? ? Dental ?no notable dental hx. ? ?  ?Pulmonary ?COPD,  COPD inhaler, former smoker,  ?  ?Pulmonary exam normal ? ? ? ? ? ? ? Cardiovascular ?hypertension, Pt. on medications and Pt. on home beta blockers ?+ CAD and + Past MI  ?Normal cardiovascular exam ? ?TTE 2021: EF 55-60%, mild LVH, mildly elevated PASP 63mHg, valves ok ?  ?Neuro/Psych ?Anxiety Depression Bipolar Disorder TIA (1994)  ? GI/Hepatic ?Neg liver ROS, hiatal hernia, GERD  Controlled and Medicated,  ?Endo/Other  ?negative endocrine ROS ? Renal/GU ?Renal InsufficiencyRenal disease  ?negative genitourinary ?  ?Musculoskeletal ? ?(+) Arthritis , Fibromyalgia - ? Abdominal ?  ?Peds ? Hematology ?negative hematology ROS ?(+)   ?Anesthesia Other Findings ?Day of surgery medications reviewed with patient. ? Reproductive/Obstetrics ?negative OB ROS ? ?  ? ? ? ? ? ? ? ? ? ? ? ? ? ?  ?  ? ? ? ? ? ? ?Anesthesia Physical ?Anesthesia Plan ? ?ASA: 3 ? ?Anesthesia Plan: General  ? ?Post-op Pain Management: Tylenol PO (pre-op)* and Regional block*  ? ?Induction:  ? ?PONV Risk Score and Plan: 3 and Midazolam, Treatment may vary due to age or medical condition, Ondansetron and Propofol infusion ? ?Airway Management Planned: LMA ? ?Additional Equipment: None ? ?Intra-op Plan:  ? ?Post-operative Plan: Extubation in OR ? ?Informed Consent: I have reviewed the patients History and Physical, chart, labs and discussed the procedure including the risks, benefits and alternatives for the proposed anesthesia with the patient or authorized  representative who has indicated his/her understanding and acceptance.  ? ? ? ?Dental advisory given ? ?Plan Discussed with: CRNA ? ?Anesthesia Plan Comments:   ? ? ? ? ? ?Anesthesia Quick Evaluation ? ?

## 2021-07-08 NOTE — Anesthesia Procedure Notes (Signed)
Procedure Name: LMA Insertion ?Date/Time: 07/08/2021 12:00 PM ?Performed by: Barkley Kratochvil, Ernesta Amble, CRNA ?Pre-anesthesia Checklist: Patient identified, Emergency Drugs available, Suction available and Patient being monitored ?Patient Re-evaluated:Patient Re-evaluated prior to induction ?Oxygen Delivery Method: Circle System Utilized ?Preoxygenation: Pre-oxygenation with 100% oxygen ?Induction Type: IV induction ?Ventilation: Mask ventilation without difficulty ?LMA: LMA inserted ?LMA Size: 4.0 ?Number of attempts: 1 ?Airway Equipment and Method: bite block ?Placement Confirmation: positive ETCO2 ?Tube secured with: Tape ?Dental Injury: Teeth and Oropharynx as per pre-operative assessment  ? ? ? ? ?

## 2021-07-08 NOTE — Progress Notes (Signed)
Assisted Dr. Daiva Huge with left, adductor canal, popliteal block. Side rails up, monitors on throughout procedure. See vital signs in flow sheet. Tolerated Procedure well. ?

## 2021-07-08 NOTE — Anesthesia Procedure Notes (Signed)
Anesthesia Regional Block: Adductor canal block  ? ?Pre-Anesthetic Checklist: , timeout performed,  Correct Patient, Correct Site, Correct Laterality,  Correct Procedure, Correct Position, site marked,  Risks and benefits discussed,  Pre-op evaluation,  At surgeon's request and post-op pain management ? ?Laterality: Left ? ?Prep: Maximum Sterile Barrier Precautions used, chloraprep     ?  ?Needles:  ?Injection technique: Single-shot ? ?Needle Type: Echogenic Stimulator Needle   ? ? ?Needle Length: 9cm  ?Needle Gauge: 22  ? ? ? ?Additional Needles: ? ? ?Procedures:,,,, ultrasound used (permanent image in chart),,    ?Narrative:  ?Start time: 07/08/2021 11:36 AM ?End time: 07/08/2021 11:39 AM ?Injection made incrementally with aspirations every 5 mL. ? ?Performed by: Personally  ?Anesthesiologist: Brennan Bailey, MD ? ?Additional Notes: ?Risks, benefits, and alternative discussed. Patient gave consent for procedure. Patient prepped and draped in sterile fashion. Sedation administered, patient remains easily responsive to voice. Relevant anatomy identified with ultrasound guidance. Local anesthetic given in 5cc increments with no signs or symptoms of intravascular injection. No pain or paraesthesias with injection. Patient monitored throughout procedure with signs of LAST or immediate complications. Tolerated well. Ultrasound image placed in chart.  ?Tawny Asal, MD ? ? ? ? ? ? ?

## 2021-07-19 NOTE — Anesthesia Postprocedure Evaluation (Signed)
Anesthesia Post Note ? ?Patient: Erin Lopez ? ?Procedure(s) Performed: Left hallux metatarsophalangeal joint arthrodesis (Left: Foot) ?Second metatarsal head excision; revision Second hammertoe correction; 2-5 percutaneus flexor tenotomies (Left: Toe) ?Removal of deep implants from hallux proximal phalanx and First and Second metatarsals (Left: Foot) ? ?  ? ?Patient location during evaluation: PACU ?Anesthesia Type: General ?Level of consciousness: awake and alert ?Pain management: pain level controlled ?Vital Signs Assessment: post-procedure vital signs reviewed and stable ?Respiratory status: spontaneous breathing, nonlabored ventilation and respiratory function stable ?Cardiovascular status: blood pressure returned to baseline ?Postop Assessment: no apparent nausea or vomiting ?Anesthetic complications: no ? ? ?No notable events documented. ? ?Last Vitals:  ?Vitals:  ? 07/08/21 1400 07/08/21 1420  ?BP: (!) 112/53 (!) 147/71  ?Pulse: 70 75  ?Resp: 16 16  ?Temp:  (!) 36.4 ?C  ?SpO2: 98% 93%  ?  ?Last Pain:  ?Vitals:  ? ? ?  ?  ?  ?  ?  ?  ? ?Marthenia Rolling ? ? ? ? ?

## 2021-08-02 DIAGNOSIS — M2022 Hallux rigidus, left foot: Secondary | ICD-10-CM | POA: Diagnosis not present

## 2021-08-11 ENCOUNTER — Other Ambulatory Visit (HOSPITAL_BASED_OUTPATIENT_CLINIC_OR_DEPARTMENT_OTHER): Payer: Medicare Other

## 2021-08-17 ENCOUNTER — Other Ambulatory Visit: Payer: Self-pay | Admitting: Family Medicine

## 2021-08-18 ENCOUNTER — Ambulatory Visit (HOSPITAL_BASED_OUTPATIENT_CLINIC_OR_DEPARTMENT_OTHER): Payer: Medicare Other

## 2021-08-19 ENCOUNTER — Ambulatory Visit (HOSPITAL_BASED_OUTPATIENT_CLINIC_OR_DEPARTMENT_OTHER)
Admission: RE | Admit: 2021-08-19 | Discharge: 2021-08-19 | Disposition: A | Discharge status: Home / Self Care | Payer: Medicare Other | Source: Ambulatory Visit | Attending: Family Medicine | Admitting: Family Medicine

## 2021-08-19 DIAGNOSIS — Z9289 Personal history of other medical treatment: Secondary | ICD-10-CM

## 2021-08-19 DIAGNOSIS — Z0000 Encounter for general adult medical examination without abnormal findings: Secondary | ICD-10-CM

## 2021-08-19 DIAGNOSIS — Z1239 Encounter for other screening for malignant neoplasm of breast: Secondary | ICD-10-CM

## 2021-08-19 DIAGNOSIS — M810 Age-related osteoporosis without current pathological fracture: Secondary | ICD-10-CM

## 2021-08-20 DIAGNOSIS — M2022 Hallux rigidus, left foot: Secondary | ICD-10-CM | POA: Diagnosis not present

## 2021-08-24 ENCOUNTER — Ambulatory Visit: Payer: Medicare Other | Admitting: Family Medicine

## 2021-08-25 ENCOUNTER — Other Ambulatory Visit: Payer: Self-pay | Admitting: Internal Medicine

## 2021-08-27 ENCOUNTER — Ambulatory Visit (HOSPITAL_BASED_OUTPATIENT_CLINIC_OR_DEPARTMENT_OTHER)
Admission: RE | Admit: 2021-08-27 | Discharge: 2021-08-27 | Disposition: A | Discharge status: Home / Self Care | Payer: Medicare Other | Source: Ambulatory Visit | Attending: Family Medicine | Admitting: Family Medicine

## 2021-08-27 DIAGNOSIS — Z1239 Encounter for other screening for malignant neoplasm of breast: Secondary | ICD-10-CM

## 2021-08-27 DIAGNOSIS — H40013 Open angle with borderline findings, low risk, bilateral: Secondary | ICD-10-CM | POA: Diagnosis not present

## 2021-08-27 DIAGNOSIS — H2513 Age-related nuclear cataract, bilateral: Secondary | ICD-10-CM | POA: Diagnosis not present

## 2021-08-27 DIAGNOSIS — H18413 Arcus senilis, bilateral: Secondary | ICD-10-CM | POA: Diagnosis not present

## 2021-08-27 DIAGNOSIS — Z1231 Encounter for screening mammogram for malignant neoplasm of breast: Secondary | ICD-10-CM | POA: Insufficient documentation

## 2021-08-27 DIAGNOSIS — H2511 Age-related nuclear cataract, right eye: Secondary | ICD-10-CM | POA: Diagnosis not present

## 2021-08-27 DIAGNOSIS — H25043 Posterior subcapsular polar age-related cataract, bilateral: Secondary | ICD-10-CM | POA: Diagnosis not present

## 2021-08-27 DIAGNOSIS — Z0000 Encounter for general adult medical examination without abnormal findings: Secondary | ICD-10-CM | POA: Insufficient documentation

## 2021-09-17 ENCOUNTER — Ambulatory Visit (INDEPENDENT_AMBULATORY_CARE_PROVIDER_SITE_OTHER): Payer: Medicare Other | Admitting: Family Medicine

## 2021-09-17 ENCOUNTER — Encounter: Payer: Self-pay | Admitting: Family Medicine

## 2021-09-17 VITALS — BP 118/73 | HR 66 | Temp 98.4°F | Ht 67.5 in | Wt 185.2 lb

## 2021-09-17 DIAGNOSIS — S0991XA Unspecified injury of ear, initial encounter: Secondary | ICD-10-CM | POA: Diagnosis not present

## 2021-09-17 DIAGNOSIS — Z23 Encounter for immunization: Secondary | ICD-10-CM | POA: Diagnosis not present

## 2021-09-17 DIAGNOSIS — M2022 Hallux rigidus, left foot: Secondary | ICD-10-CM | POA: Diagnosis not present

## 2021-09-17 NOTE — Progress Notes (Signed)
OFFICE VISIT  09/17/2021  CC:  Chief Complaint  Patient presents with   Ear concern    Pt thinks she poked a hole in her left ear trying to clean it about 3-4 days ago. She was using a Q-tip. A little blood came out on Wednesday. Denies ringing of the ear. She describes it as a "shh" sound in her ear as if a blood vessel popped.    Patient is a 71 y.o. female who presents for ear concerns.  HPI: 3 days ago she was cleaning her left ear with a Q-tip and pushed too far.  It does not hurt but has felt very full in all when she lays on that side she had a tiny bit of blood come out at some point afterwards but this stopped quickly.    Past Medical History:  Diagnosis Date   Alcoholism in remission [facility])    states last alcohol 11 yrs ago   Anemia    Anxiety    Bipolar 1 disorder ([place])    Admission for mania x 2 (most recent 11/2017)   Cervical spondylosis without myelopathy 12/19/2013   MRI 02/2014: multilevel DDD/spondylosis, not much change compared to prior MRI.  Pt not in favor of invasive therapy for her neck as of 04/2014.   Cholelithiasis    COPD (chronic obstructive pulmonary disease) (HCC)    Moderate: anoro started 04/2017 by pulm, pt symptomatically improved and PFTs stable at f/u 06/2017.   Coronary artery spasm ([place]) 07/11/2018   nitrates=HA. Amlodipine=intol swelling.  Changed to coreg 09/03/18 by cardiologist.   Depression    Dilutional hyponatremia    Endometritis 05/2017   possible; empiric tx with Flagyl by GYN.   Environmental allergies    Fibromyalgia    GERD (gastroesophageal reflux disease)    Hepatic steatosis 03/2019   u/s abd   Herpes zoster 08/07/2017   L scapular region   Hiatal hernia    History of adenomatous polyp of colon 04/2008; 08/2014   No high grade dysplasia: recall 5 yrs ([name], [name])   History of basal cell carcinoma excision    NOSE   History of Helicobacter pylori infection 04/2008   +gastric biopsy (gastritis but no metaplasia,  dysplasia, or malignancy identified)   History of kidney stones    History of TIA (transient ischemic attack)    secondary to HELLP syndrome 1994 post c/s--  residual memory loss   Hyperlipidemia    statin started after her NSTEMI: goal  LDL <70.   Hypertension    Internal hemorrhoids    Ischemic cardiomyopathy 05/2018   Echo EF 45-50% in context of NSTEMI from CA spasm. 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 04/2017.   Leg pain    ABIs 11/2019: normal    Memory loss    Abnl MRI brain and CT brain c/w chronic microvascular ischemia.  Repeat MRI brain 02/2014 stable (Dr. Mayra Reel with no plan to f/u with neuro as of 04/2014)   NSTEMI (non-ST elevated myocardial infarction) ([place]) 05/2018   Echo EF 45-50% in context of NSTEMI from CA spasm.// Myoview 8/21: EF 66, no ischemia, low risk   Osteoporosis 05/2010   2012 'penia; 06/2015 'porosis:  prolia.  DEXA 09/2017 T-score -2.2 femur neck.  As  of 11/2018 we're restarting prolia soon and will check next DEXA 1 yr from her next prolia injection.   Pelvic floor dysfunction    [facility] ([name])   Pneumonia    Postmenopausal vaginal bleeding 05/2017   x 1 small episode; GYN attempted endo bx but unable to penetrate cervix due to severe cerv stenosis (due to postmenopausal state + hx of LEEP).  Endo u/s showed thin endo lining.  Per GYN---no evidence of endomet pathology on w/u---obs as of 07/2017.   Prediabetes 06/2016   Fasting gluc 105; HbA1c at that time was 6.0%.  A1c 6.2% 12/2017.  A1c 5.7% Feb 2023.   Recurrent kidney stones 08/2013   Left ureteral calculus: Perc nephr--cystoscopy w/ureteroscopy + laser for stone removal.  Residual asymptomatic left renal nephrolithiasis <32m present  post-procedure.  Right sided hydronephrosis-persistent (on u/s)--urol ordered CT to further eval 08/2016: 1.6 cm nonobstructing stone lower pole left kidney, no hydro, plan for PCN extraction ([facility])   Shortness of breath    Sprain of neck 12/19/2013   Stroke (HCC)    TIA   TMJ (temporomandibular joint disorder)    USES  MOUTH GUARD   Toe fracture, left 12/2017   2nd toe prox phalanx (immobilization, Dr. RZadie Rhine   Tubular adenoma of colon    Vitamin D deficiency 05/2011   Weak urinary stream 07/2016   with elevated PVR and mild left hydronephrosis secondary to this ([facility] started flomax 0.'4mg'$  qd for this).    Past Surgical History:  Procedure Laterality Date   ABIs  11/29/2019   NORMAL   ANTERIOR CERVICAL DECOMP/DISCECTOMY FUSION  06/30/2009   C3 -- C5  AND EXPLORATION OF FUSION C5-7 W/  PLATE REMOVAL   ARTHRODESIS METATARSALPHALANGEAL JOINT (MTPJ) Left 07/08/2021   Procedure: Left hallux metatarsophalangeal joint arthrodesis;  Surgeon: HWylene Simmer MD;  Location: [facility]  Service: Orthopedics;  Laterality: Left;   BACK SURGERY     BUNIONECTOMY Left    CARDIOVASCULAR STRESS TEST  12/03/2019   myoc perf im: NORMAL   CERVICAL FUSION  2002   anterior C5 -- C7   CERVICAL SPINE SURGERY  08/27/1999     C6 -- T1  LAMINECTOMY/  DISKECTOMY   CESAREAN SECTION  1994   CHOLECYSTECTOMY N/A 03/06/2020   Procedure: LAPAROSCOPIC CHOLECYSTECTOMY;  Surgeon: SFelicie Morn MD;  Location[place] [facility]  Service: General;  Laterality: N/A;   COLONOSCOPY W/ POLYPECTOMY  04/2008;08/2014   2016 tubular adenoma x 1; +diverticulosis and int/ext hemorrhoids.  06/2020 adenoma, recall 5 yrs.   CYSTOSCOPY WITH URETEROSCOPY AND STENT PLACEMENT Left 08/28/2013   Procedure: CYSTOSCOPY, RGP,  WITH URETEROSCOPY AND STENT REMOVAL;  Surgeon: DBernestine Amass MD;  [facility]  Service: Urology;  Laterality: Left;   DEXA  09/2017   T-score -2.2 femur neck: improved  compared to 06/2015.   ESOPHAGOGASTRODUODENOSCOPY  2010; 08/2014   2016 +Candidal esophagitis; Mild chronic gastritis w/intestinal metaplasia--NEG H pylori, neg for eosinophilic esoph. 31/6109esoph candidiasis (diflucan x 20d), o/w normal.   HAMMER TOE SURGERY Left 07/08/2021   Procedure: Second metatarsal head excision; revision Second hammertoe correction; 2-5 percutaneus flexor tenotomies;  Surgeon: HWylene Simmer MD;  [place]  Service: Orthopedics;  Laterality: Left;   HARDWARE REMOVAL Left 07/08/2021   Procedure: Removal of deep implants from hallux proximal phalanx and First and Second metatarsals;  Surgeon: HWylene Simmer MD;  Location: [facility]  Service: Orthopedics;  Laterality:  Left;   HOLMIUM LASER APPLICATION Left 15/40/0867   Procedure: HOLMIUM LASER APPLICATION;  Surgeon: [name], MD;  Location: [facility];  Service: Urology;  Laterality: Left;   JOINT REPLACEMENT     LEFT HEART CATH AND CORONARY ANGIOGRAPHY N/A 05/15/2018   Evidence for spasm in distal LAD.  Mod RCA atherosclerosis, o/w no CAD.  Procedure: LEFT HEART CATH AND CORONARY ANGIOGRAPHY;  Surgeon: Leonie Man, MD;  Location: [place] [facility];  Service: Cardiovascular;  Laterality: N/A;   NEGATIVE SLEEP STUDY  04/01/2007   NEPHROLITHOTOMY Left 08/05/2013   Procedure: NEPHROLITHOTOMY PERCUTANEOUS;  Surgeon: [name], MD;  Location: [facility];  Service: Urology;  Laterality: Left;   POLYSOMNOGRAM  01/2019   NORMAL   POLYSOMNOGRAPHY  10/25/2018   Dr. Dion Saucier due to pt inadequat sleep time (83 min).   TONSILLECTOMY AND ADENOIDECTOMY  1975   TOTAL KNEE ARTHROPLASTY Right 2003   TRANSTHORACIC ECHOCARDIOGRAM  05/2018; 12/03/2019   (after MI secondary to arterial spasm): EF 45-50%; severe hypokinesis of apical anterolateral, inferolateral , anterior, and inferior LV myocardium. 11/2019 EF 55-60%, valves fine   ULTRASOUND EXAM,PELVIC  COMPLETE ([facility] HX)  06/25/2015   NORMAL (done by GYN)    Outpatient Medications Prior to Visit  Medication Sig Dispense Refill   acetaminophen (TYLENOL) 500 MG tablet Take 1,000 mg by mouth every 6 (six) hours as needed for mild pain or headache.      albuterol (VENTOLIN HFA) 108 (90 Base) MCG/ACT inhaler INHALE 2 PUFFS INTO THE LUNGS EVERY 4 HOURS AS NEEDED FOR WHEEZE OR FOR SHORTNESS OF BREATH 8.5 each 2   ALPRAZolam (XANAX) 0.5 MG tablet TAKE 1 TABLET BY MOUTH THREE TIMES A DAY AS NEEDED FOR ANXIETY 90 tablet 5   aspirin EC 81 MG EC tablet Take 1 tablet (81 mg total) by mouth daily. 90 tablet 3   atorvastatin (LIPITOR) 80 MG tablet TAKE 1 TABLET BY MOUTH DAILY AT 6 PM. 90 tablet 3   citalopram (CELEXA) 20 MG tablet Take 1 tablet (20 mg total) by mouth in the morning and at bedtime. 180 tablet 3   cyclobenzaprine (FLEXERIL) 10 MG tablet TAKE 1 TABLET BY MOUTH TWICE A DAY 60 tablet 1   doxycycline (VIBRAMYCIN) 50 MG capsule Take 50 mg by mouth daily.     famotidine (PEPCID) 40 MG tablet TAKE 1 TABLET BY MOUTH EVERY DAY 90 tablet 3   fluticasone (FLONASE) 50 MCG/ACT nasal spray SPRAY 2 SPRAYS INTO EACH NOSTRIL EVERY DAY 48 mL 1   hydrochlorothiazide (HYDRODIURIL) 12.5 MG tablet TAKE 1 TABLET BY MOUTH EVERY DAY 90 tablet 3   lamoTRIgine (LAMICTAL) 100 MG tablet Take 3 tablets (300 mg total) by mouth at bedtime. 270 tablet 3   losartan (COZAAR) 25 MG tablet TAKE 1 TABLET BY MOUTH EVERY DAY 90 tablet 3   Magnesium 250 MG TABS Take 250 mg by mouth daily.      metoprolol succinate (TOPROL-XL) 100 MG 24 hr tablet Take 1 tablet (100 mg total) by mouth daily. Take with or immediately following a meal. 90 tablet 3   montelukast (SINGULAIR) 10 MG tablet TAKE 1 TABLET BY MOUTH EVERY DAY IN THE EVENING 90 tablet 1   Multiple Vitamin (MULTI-VITAMINS) TABS Take 1 tablet by mouth daily.      pantoprazole (PROTONIX) 40 MG tablet TAKE 1 TABLET BY MOUTH EVERY DAY 90 tablet 0   polyethylene glycol powder  (GLYCOLAX/MIRALAX) powder TAKE 17 G BY MOUTH  DAILY. 3162 g 0   Propylene Glycol 0.6 % SOLN Apply 1 drop to eye 2 (two) times daily.     tamsulosin (FLOMAX) 0.4 MG CAPS capsule Take 0.4 mg by mouth daily.     Tiotropium Bromide-Olodaterol (STIOLTO RESPIMAT) 2.5-2.5 MCG/ACT AERS INHALE 2 PUFFS BY MOUTH INTO THE LUNGS DAILY 4 g 5   triamcinolone cream (KENALOG) 0.1 % Apply 1 application topically daily.     zolpidem (AMBIEN) 10 MG tablet TAKE 1 TABLET BY MOUTH EVERYDAY AT BEDTIME 90 tablet 1   BESIVANCE 0.6 % SUSP Place 1 drop into the right eye 3 (three) times daily. (Patient not taking: Reported on 09/17/2021)     ipratropium-albuterol (DUONEB) 0.5-2.5 (3) MG/3ML SOLN TAKE 3 MLS BY NEBULIZATION 3 (THREE) TIMES DAILY AS NEEDED. (Patient not taking: Reported on 09/17/2021) 360 mL 3   nitroGLYCERIN (NITROSTAT) 0.4 MG SL tablet Place 1 tablet (0.4 mg total) under the tongue every 5 (five) minutes x 3 doses as needed for chest pain. (Patient not taking: Reported on 09/17/2021) 25 tablet 3   prednisoLONE acetate (PRED FORTE) 1 % ophthalmic suspension Place 1 drop into the right eye 4 (four) times daily. (Patient not taking: Reported on 09/17/2021)     PROLENSA 0.07 % SOLN Place 1 drop into the right eye at bedtime. (Patient not taking: Reported on 09/17/2021)     doxycycline (VIBRA-TABS) 100 MG tablet Take 1 tablet (100 mg total) by mouth 2 (two) times daily. (Patient not taking: Reported on 09/17/2021) 10 tablet 0   Tiotropium Bromide-Olodaterol (STIOLTO RESPIMAT) 2.5-2.5 MCG/ACT AERS Inhale 2 puffs into the lungs daily. (Patient not taking: Reported on 09/17/2021) 4 g 0   No facility-administered medications prior to visit.    Allergies  Allergen Reactions   Ceftin [Cefuroxime Axetil] Anaphylaxis    Swelling of eyes and tongue   Ciprofloxacin Anaphylaxis    Difficulty breathing and generalized swelling   Fosamax [Alendronate Sodium] Diarrhea   Morphine And Related Other (See Comments)    Hallucinations/  disoriented - pt stated, "Only Morphine" - 03/23/17   Oxycodone Other (See Comments)    Patient was "hooked" on medication.   Prednisone Other (See Comments)    Hyperactivity   Seroquel [Quetiapine] Other (See Comments)    oversedation    ROS As per HPI  PE:    09/17/2021    3:26 PM 07/08/2021    2:20 PM 07/08/2021    2:00 PM  Vitals with BMI  Height 5' 7.5"    Weight 185 lbs 3 oz    BMI 76.16    Systolic 073 710 626  Diastolic 73 71 53  Pulse 66 75 70     Physical Exam  General: Alert and well-appearing. Right external auditory canal clear, tympanic membrane intact and normal. Left external auditory canal clear.  TM is intact.  There is a very small spot of dried blood abutting the TM.  No swelling.  LABS:  Last metabolic panel Lab Results  Component Value Date   GLUCOSE 101 (H) 07/01/2021   NA 136 07/01/2021   K 3.8 07/01/2021   CL 102 07/01/2021   CO2 27 07/01/2021   BUN 11 07/01/2021   CREATININE 0.95 07/01/2021   GFRNONAA >60 07/01/2021   CALCIUM 9.5 07/01/2021   PROT 6.6 05/06/2021   ALBUMIN 4.6 05/06/2021   BILITOT 0.8 05/06/2021   ALKPHOS 55 05/06/2021   AST 23 05/06/2021   ALT 22 05/06/2021   ANIONGAP 7 07/01/2021   IMPRESSION  AND PLAN:  Left tympanic membrane/distal canal trauma.  Her TM is intact.  I think it just can take some more time for her to feel normal in that ear again.  Reassured.  She is due for Prevnar 20 so we gave her this today  She is likely going to have knee surgery later this fall.  An After Visit Summary was printed and given to the patient.  FOLLOW UP: Return if symptoms worsen or fail to improve.  Signed:  Crissie Sickles, MD           09/17/2021

## 2021-09-22 ENCOUNTER — Other Ambulatory Visit: Payer: Self-pay | Admitting: Family Medicine

## 2021-09-22 NOTE — Therapy (Signed)
OUTPATIENT PHYSICAL THERAPY LOWER EXTREMITY EVALUATION   Patient Name: Erin Lopez MRN: [id number] DOB:12/23/1950, 71 y.o., female Today's Date: 09/23/2021   PT End of Session - 09/23/21 1104     Visit Number 1    Number of Visits 13    Date for PT Re-Evaluation 12/22/21    Authorization Type U[employer] MCR    PT Start Time 1100    PT Stop Time 1145    PT Time Calculation (min) 45 min    Activity Tolerance Patient tolerated treatment well    Behavior During Therapy WFL for tasks assessed/performed             Past Medical History:  Diagnosis Date   Alcoholism in remission ([place])    states last alcohol 11 yrs ago   Anemia    Anxiety    Bipolar 1 disorder ([place])    Admission for mania x 2 (most recent 11/2017)   Cervical spondylosis without myelopathy 12/19/2013   MRI 02/2014: multilevel DDD/spondylosis, not much change compared to prior MRI.  Pt not in favor of invasive therapy for her neck as of 04/2014.   Cholelithiasis    COPD (chronic obstructive pulmonary disease) (HCC)    Moderate: anoro started 04/2017 by pulm, pt symptomatically improved and PFTs stable at f/u 06/2017.   Coronary artery spasm ([place]) 07/11/2018   nitrates=HA. Amlodipine=intol swelling.  Changed to coreg 09/03/18 by cardiologist.   Depression    Dilutional hyponatremia    Endometritis 05/2017   possible; empiric tx with Flagyl by GYN.   Environmental allergies    Fibromyalgia    GERD (gastroesophageal reflux disease)    Hepatic steatosis 03/2019   u/s abd   Herpes zoster 08/07/2017   L scapular region   Hiatal hernia    History of adenomatous polyp of colon 04/2008; 08/2014   No high grade dysplasia: recall 5 yrs ([name], [name])   History of basal cell carcinoma excision    NOSE   History of Helicobacter pylori infection 04/2008   +gastric biopsy (gastritis but no metaplasia, dysplasia, or malignancy identified)   History of kidney stones    History of TIA (transient ischemic attack)     secondary to HELLP syndrome 1994 post c/s--  residual memory loss   Hyperlipidemia    statin started after her NSTEMI: goal  LDL <70.   Hypertension    Internal hemorrhoids    Ischemic cardiomyopathy 05/2018   Echo EF 45-50% in context of NSTEMI from CA spasm. 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 04/2017.   Leg pain    ABIs 11/2019: normal    Memory loss    Abnl MRI brain and CT brain c/w chronic microvascular ischemia.  Repeat MRI brain 02/2014 stable (Dr. Mayra Reel with no plan to f/u with neuro as of 04/2014)   NSTEMI (non-ST elevated myocardial infarction) ([place]) 05/2018   Echo EF 45-50% in context of NSTEMI from CA spasm.// Myoview 8/21: EF 66, no ischemia, low risk   Osteoporosis 05/2010   2012 'penia; 06/2015 'porosis:  prolia.  DEXA 09/2017 T-score -2.2 femur neck.  As of 11/2018 we're restarting prolia soon and will check next DEXA 1 yr from her next prolia injection.   Pelvic floor dysfunction    [facility]  urol ([name])   Pneumonia    Postmenopausal vaginal bleeding 05/2017   x 1 small episode; GYN attempted endo bx but unable to penetrate cervix due to severe cerv stenosis (due to postmenopausal state + hx of LEEP).  Endo u/s showed thin endo lining.  Per GYN---no evidence of endomet pathology on w/u---obs as of 07/2017.   Prediabetes 06/2016   Fasting gluc 105; HbA1c at that time was 6.0%.  A1c 6.2% 12/2017.  A1c 5.7% Feb 2023.   Recurrent kidney stones 08/2013   Left ureteral calculus: Perc nephr--cystoscopy w/ureteroscopy + laser for stone removal.  Residual asymptomatic left renal nephrolithiasis <55m present post-procedure.  Right sided hydronephrosis-persistent (on u/s)--urol ordered CT to further eval 08/2016: 1.6 cm  nonobstructing stone lower pole left kidney, no hydro, plan for PCN extraction ([facility])   Shortness of breath    Sprain of neck 12/19/2013   Stroke (HCC)    TIA   TMJ (temporomandibular joint disorder)    USES  MOUTH GUARD   Toe fracture, left 12/2017   2nd toe prox phalanx (immobilization, Dr. RZadie Rhine   Tubular adenoma of colon    Vitamin D deficiency 05/2011   Weak urinary stream 07/2016   with elevated PVR and mild left hydronephrosis secondary to this ([facility] started flomax 0.'4mg'$  qd for this).   Past Surgical History:  Procedure Laterality Date   ABIs  11/29/2019   NORMAL   ANTERIOR CERVICAL DECOMP/DISCECTOMY FUSION  06/30/2009   C3 -- C5  AND EXPLORATION OF FUSION C5-7 W/  PLATE REMOVAL   ARTHRODESIS METATARSALPHALANGEAL JOINT (MTPJ) Left 07/08/2021   Procedure: Left hallux metatarsophalangeal joint arthrodesis;  Surgeon: HWylene Simmer MD;  [facility]  Service: Orthopedics;  Laterality: Left;   BACK SURGERY     BUNIONECTOMY Left    CARDIOVASCULAR STRESS TEST  12/03/2019   myoc perf im: NORMAL   CERVICAL FUSION  2002   anterior C5 -- C7   CERVICAL SPINE SURGERY  08/27/1999     C6 -- T1  LAMINECTOMY/  DISKECTOMY   CESAREAN SECTION  1994   CHOLECYSTECTOMY N/A 03/06/2020   Procedure: LAPAROSCOPIC CHOLECYSTECTOMY;  Surgeon: SFelicie Morn MD;  [facility]  Service: General;  Laterality: N/A;   COLONOSCOPY W/ POLYPECTOMY  04/2008;08/2014   2016 tubular adenoma x 1; +diverticulosis and int/ext hemorrhoids.  06/2020 adenoma, recall 5 yrs.   CYSTOSCOPY WITH URETEROSCOPY AND STENT PLACEMENT Left 08/28/2013   Procedure: CYSTOSCOPY, RGP,  WITH URETEROSCOPY AND STENT REMOVAL;  Surgeon: DBernestine Amass MD;  [facility]  Service: Urology;  Laterality: Left;   DEXA  09/2017   T-score -2.2 femur neck: improved compared to 06/2015.   ESOPHAGOGASTRODUODENOSCOPY  2010; 08/2014   2016 +Candidal esophagitis; Mild chronic  gastritis w/intestinal metaplasia--NEG H pylori, neg for eosinophilic esoph. 30/1027esoph candidiasis (diflucan x 20d), o/w normal.   HAMMER TOE SURGERY Left 07/08/2021   Procedure: Second metatarsal head excision; revision Second hammertoe correction; 2-5 percutaneus flexor tenotomies;  Surgeon: HWylene Simmer MD;  [facility] [facility]  Service: Orthopedics;  Laterality: Left;   HARDWARE REMOVAL Left 07/08/2021   Procedure: Removal of deep implants from hallux proximal phalanx and First and Second metatarsals;  Surgeon: HWylene Simmer MD;  [facility]  Service: Orthopedics;  Laterality: Left;   HOLMIUM LASER APPLICATION Left 025/36/6440  Procedure: HOLMIUM LASER APPLICATION;  Surgeon: DBernestine Amass MD;  [facility]  Service:  Urology;  Laterality: Left;   JOINT REPLACEMENT     LEFT HEART CATH AND CORONARY ANGIOGRAPHY N/A 05/15/2018   Evidence for spasm in distal LAD.  Mod RCA atherosclerosis, o/w no CAD.  Procedure: LEFT HEART CATH AND CORONARY ANGIOGRAPHY;  Surgeon: Leonie Man, MD;  Location: [facility];  Service: Cardiovascular;  Laterality: N/A;   NEGATIVE SLEEP STUDY  04/01/2007   NEPHROLITHOTOMY Left 08/05/2013   Procedure: NEPHROLITHOTOMY PERCUTANEOUS;  Surgeon: [name], MD;  Location[facility];  Service: Urology;  Laterality: Left;   POLYSOMNOGRAM  01/2019   NORMAL   POLYSOMNOGRAPHY  10/25/2018   Dr. Dion Saucier due to pt inadequat sleep time (83 min).   TONSILLECTOMY AND ADENOIDECTOMY  1975   TOTAL KNEE ARTHROPLASTY Right 2003   TRANSTHORACIC ECHOCARDIOGRAM  05/2018; 12/03/2019   (after MI secondary to arterial spasm): EF 45-50%; severe hypokinesis of apical anterolateral, inferolateral , anterior, and inferior LV myocardium. 11/2019 EF 55-60%, valves fine   ULTRASOUND EXAM,PELVIC COMPLETE ([place] [facility])  06/25/2015   NORMAL (done by GYN)   Patient Active Problem List   Diagnosis Date  Noted   Shortness of breath 04/19/2019   Healthcare maintenance 04/19/2019   Overweight (BMI 25.0-29.9) 02/19/2019   Ischemic cardiomyopathy 09/03/2018   Chest pain 07/11/2018   Coronary vasospasm ([place]) 07/11/2018   NSTEMI (non-ST elevated myocardial infarction) ([place]) 05/14/2018   Acute encephalopathy 11/19/2017   Hyponatremia 11/16/2017   Bipolar 1 disorder ([place]) 12/29/2016   Hypertension 12/29/2016   TIA (transient ischemic attack) 12/29/2016   Chronic pelvic pain in female 08/22/2016   Stage 2 chronic kidney disease 08/22/2016   Osteoporosis 09/30/2015   Health maintenance examination 06/11/2015   Suprapubic discomfort 06/05/2015   Urethral caruncle 06/05/2015   Infection of urinary tract 06/05/2015   TMJ (temporomandibular joint disorder) 02/25/2015   Right ankle injury 11/05/2014   Cervical spondylosis without myelopathy 12/19/2013   Sprain of neck 12/19/2013   Memory difficulties 12/19/2013   Postnasal drip 10/28/2013   Renal calculus 08/05/2013   Urinary frequency 08/01/2013   Constipation, chronic 07/29/2013   COPD (chronic obstructive pulmonary disease) ([place]) 06/30/2013   Abnormal chest x-ray 06/30/2013   Benign essential HTN 05/29/2013   Kidney stone on left side 05/29/2013   Hyperlipidemia 05/29/2013    PCP: Tammi Sou, MD  REFERRING PROVIDER: Corky Sing, PA-C  REFERRING DIAG: M20.22 (ICD-10-CM) - Hallux rigidus, left foot  Procedure(s): 1.  Left first metatarsal removal of deep implant 2.  Left hallux proximal phalanx removal of deep implant 3.  Left hallux metatarsophalangeal joint arthrodesis 4.  Removal of deep implant from the left second metatarsal 5.  Left second metatarsal head excision 6.  Left second hammertoe correction 7.  Left third, fourth and fifth toe flexor digitorum longus tenotomies 8.  AP, lateral and oblique radiographs of the left foot  THERAPY DIAG:  Pain in left toe(s)  Stiffness of left foot, not elsewhere  classified  Difficulty walking  Rationale for Evaluation and Treatment Rehabilitation  ONSET DATE: 07/08/2021 Surgery  SUBJECTIVE:   SUBJECTIVE STATEMENT: Pt states that she has L foot pain and swelling since having surgery. Pain is the top of the foot and lateral. Pt has pain all the time walking, big toe mostly with walking. Putting her shoe on it hurts. She states her current shoes are too small and old. Pt has pain with any walking. Pushing off on the foot hurts. Pt denies red flag symptoms. Pt states she mainly  sedentary due to the foot pain.   PERTINENT HISTORY: C/S surgery, Stroke, TIA, Hallux deformity+adhesive capsulitis+ hammertoe:  Severe structural bunion deformity with hallux interphalangeus and severe arthritis of the second MPJ left foot--surgical repair/osteotomies by [name] 04/2017.  PAIN:  Are you having pain? Yes: NPRS scale: 5/10 Pain location: L big toe Pain description: throbbing, sharp Aggravating factors: any movement Relieving factors: resting   PRECAUTIONS: None  WEIGHT BEARING RESTRICTIONS No  FALLS:  Has patient fallen in last 6 months? No  LIVING ENVIRONMENT: Lives with: lives alone Lives in: House/apartment Stairs: 1st level Has following equipment at home: None  OCCUPATION: N/A  PLOF: Independent  PATIENT GOALS : Pt would like to get more mobile and improve walking.    OBJECTIVE:   DIAGNOSTIC FINDINGS: N/A  PATIENT SURVEYS:  FOTO 28 41 DC 19 pts MCII  COGNITION:  Overall cognitive status: Within functional limits for tasks assessed     SENSATION: Light touch: Impaired   EDEMA:  Moderate edema between 2nd and 3rd digit, non pitting   POSTURE: increased thoracic kyphosis, flexed trunk , and weight shift right, pes planus  PALPATION: TTP dorsum of lateral foot along 3-5th rays, great toe plantar surface TTP and joint mobilization into ext  LOWER EXTREMITY ROM:  MMT Right eval Left eval  Ankle dorsiflexion 10 3  Ankle  plantarflexion [facility]  Ankle inversion 25 20  Ankle eversion 20 10   (Blank rows = not tested)  Great toe extension on L: 5 deg  LOWER EXTREMITY MMT:  Passive ROM Right eval Left eval  Ankle dorsiflexion 4+/5 4/5 p!  Ankle plantarflexion 4+/5 4/5 p!  Ankle inversion 4+/5 4-/5 p!  Ankle eversion 4+/5 4-/5 p!   (Blank rows = not tested)   FUNCTIONAL TESTS:  5 times sit to stand: unable to perform due to L foot pain  GAIT: Distance walked: 45f Assistive device utilized: None Level of assistance: Complete Independence Comments: decreased toe off on L, early heel off, lack of distinct heel strike; pain throughout  TODAY'S TREATMENT:   Exercises - Gastroc Stretch on Wall  - 2 x daily - 7 x weekly - 1 sets - 3 reps - 30 hold - Toe Extension and Flexion Caregiver PROM  - 2 x daily - 7 x weekly - 1 sets - 10 reps - 10 hold - Sit to Stand with Arms Crossed  - 2 x daily - 7 x weekly - 2 sets - 10 reps  Recommended shoe with stiff toe break, rocker bottom, and carbon foot plate- referral with information to [employer]  PATIENT EDUCATION:  Education details: MOI, diagnosis, prognosis, anatomy, footwear recommendations, exercise progression, DOMS expectations, muscle firing,  envelope of function, HEP, POC  Person educated: Patient Education method: Explanation, Demonstration, Tactile cues, Verbal cues, and Handouts Education comprehension: verbalized understanding, returned demonstration, verbal cues required, and tactile cues required   HOME EXERCISE PROGRAM:  Access Code: [email or web address]: [email or web address] Date: 09/23/2021 Prepared by: ADaleen Bo ASSESSMENT:  CLINICAL IMPRESSION: Patient is a 71y.o. female who was seen today for physical therapy evaluation and treatment for cc of L hallux rigidus. Pt with very extensive medical history but chief complaint is L toe/foot pain. Pt has expected extension limitations at the 1st toe as well as the TCJ  following surgical history. Pt lack DF at both locations in the sagittal plane as well as generalized L LE weakness from surgery and history of sedentary lifestyle. Pt is significantly limited  in her ADL and community ambulation due to her L foot pain. Pt unable to walk/WB a meaningful distance at this time to participate in aquatic therapy. Pt advised on footwear as well as potential of custom orthotics. Pt to be transferred to [facility] to see foot/ankle therapist for continuation of exercise to promote more generalized mobility. Return to aquatic therapy PRN. Pt would benefit from continued skilled therapy in order to reach goals and maximize functional L LE strength and ROM for full return to PLOF.    OBJECTIVE IMPAIRMENTS Abnormal gait, decreased activity tolerance, decreased balance, decreased coordination, decreased endurance, decreased knowledge of condition, decreased mobility, difficulty walking, decreased ROM, decreased strength, hypomobility, impaired flexibility, impaired sensation, improper body mechanics, postural dysfunction, and pain.   ACTIVITY LIMITATIONS carrying, lifting, standing, squatting, stairs, transfers, and locomotion level  PARTICIPATION LIMITATIONS: meal prep, cleaning, laundry, interpersonal relationship, driving, shopping, community activity, yard work, and exercise  PERSONAL FACTORS Age, Behavior pattern, Fitness, Past/current experiences, Time since onset of injury/illness/exacerbation, and 3+ comorbidities:    are also affecting patient's functional outcome.   REHAB POTENTIAL: Fair    CLINICAL DECISION MAKING: Evolving/moderate complexity  EVALUATION COMPLEXITY: Moderate   GOALS:   SHORT TERM GOALS: Target date: 11/04/2021   Pt will become independent with HEP in order to demonstrate synthesis of PT education.   Goal status: INITIAL  2.  Pt will report at least 2 pt reduction on NPRS scale for pain in order to demonstrate functional improvement with household  activity, self care, and ADL.   Goal status: INITIAL  3.  Pt will score at least 19 pt increase on FOTO to demonstrate functional improvement in MCII and pt perceived function.    Goal status: INITIAL    LONG TERM GOALS: Target date: 12/16/2021   Pt  will become independent with final HEP in order to demonstrate synthesis of PT education.   Goal status: INITIAL  2.  Pt will score >/= 41 on FOTO to demonstrate improvement in perceived L foot function.   Goal status: INITIAL  3.  Pt will be able to demonstrate/report ability to walk >15 mins without pain in order to demonstrate functional improvement and tolerance to exercise and community mobility.   Goal status: INITIAL  4.  Pt will be able to perform 5XSTS in under 12s  in order to demonstrate functional improvement above the cut off score for adults.   Goal status: INITIAL   PLAN: PT FREQUENCY: 1-2x/week  PT DURATION: 12 weeks  PLANNED INTERVENTIONS: Therapeutic exercises, Therapeutic activity, Neuromuscular re-education, Balance training, Gait training, Patient/Family education, Joint manipulation, Joint mobilization, Stair training, Orthotic/Fit training, DME instructions, Aquatic Therapy, Dry Needling, Electrical stimulation, Spinal manipulation, Spinal mobilization, Moist heat, scar mobilization, Splintting, Taping, Vasopneumatic device, Traction, Ultrasound, Ionotophoresis '4mg'$ /ml Dexamethasone, Manual therapy, and Re-evaluation  PLAN FOR NEXT SESSION: joint mob to 1st toe and L ankle, review of HEP, progression of L ankle stability, review footwear reccs   Daleen Bo, PT 09/23/2021, 12:38 PM

## 2021-09-23 ENCOUNTER — Other Ambulatory Visit: Payer: Self-pay

## 2021-09-23 ENCOUNTER — Ambulatory Visit (HOSPITAL_BASED_OUTPATIENT_CLINIC_OR_DEPARTMENT_OTHER): Payer: Medicare Other | Attending: Student | Admitting: Physical Therapy

## 2021-09-23 ENCOUNTER — Encounter (HOSPITAL_BASED_OUTPATIENT_CLINIC_OR_DEPARTMENT_OTHER): Payer: Self-pay | Admitting: Physical Therapy

## 2021-09-23 DIAGNOSIS — M25675 Stiffness of left foot, not elsewhere classified: Secondary | ICD-10-CM | POA: Diagnosis not present

## 2021-09-23 DIAGNOSIS — R262 Difficulty in walking, not elsewhere classified: Secondary | ICD-10-CM | POA: Insufficient documentation

## 2021-09-23 DIAGNOSIS — M79675 Pain in left toe(s): Secondary | ICD-10-CM | POA: Insufficient documentation

## 2021-09-23 NOTE — Telephone Encounter (Signed)
Requesting: alprazolam Contract: 03/30/20 UDS: 10/29/20 Last Visit: 09/17/21 Next Visit: 12/28/21 Last Refill: 12/222/22(90,5)  RF request for flexeril LOV:09/17/21 Next ov: 12/28/21 Last written: 08/17/21(60,1)   Meds pending. Please review and advise

## 2021-09-27 ENCOUNTER — Other Ambulatory Visit (HOSPITAL_COMMUNITY): Payer: Medicare Other

## 2021-09-28 ENCOUNTER — Ambulatory Visit (INDEPENDENT_AMBULATORY_CARE_PROVIDER_SITE_OTHER): Payer: Medicare Other | Admitting: Physical Therapy

## 2021-09-28 ENCOUNTER — Encounter: Payer: Medicare Other | Admitting: Physical Therapy

## 2021-09-28 ENCOUNTER — Encounter: Payer: Self-pay | Admitting: Physical Therapy

## 2021-09-28 DIAGNOSIS — R262 Difficulty in walking, not elsewhere classified: Secondary | ICD-10-CM

## 2021-09-28 DIAGNOSIS — M25675 Stiffness of left foot, not elsewhere classified: Secondary | ICD-10-CM

## 2021-09-28 DIAGNOSIS — M79675 Pain in left toe(s): Secondary | ICD-10-CM | POA: Diagnosis not present

## 2021-09-30 ENCOUNTER — Emergency Department (HOSPITAL_BASED_OUTPATIENT_CLINIC_OR_DEPARTMENT_OTHER)
Admission: EM | Admit: 2021-09-30 | Discharge: 2021-09-30 | Disposition: A | Discharge status: Home / Self Care | Payer: Medicare Other | Attending: Emergency Medicine | Admitting: Emergency Medicine

## 2021-09-30 ENCOUNTER — Other Ambulatory Visit: Payer: Self-pay

## 2021-09-30 ENCOUNTER — Encounter (HOSPITAL_BASED_OUTPATIENT_CLINIC_OR_DEPARTMENT_OTHER): Payer: Self-pay

## 2021-09-30 ENCOUNTER — Emergency Department (HOSPITAL_BASED_OUTPATIENT_CLINIC_OR_DEPARTMENT_OTHER): Payer: Medicare Other

## 2021-09-30 ENCOUNTER — Emergency Department (HOSPITAL_BASED_OUTPATIENT_CLINIC_OR_DEPARTMENT_OTHER): Payer: Medicare Other | Admitting: Radiology

## 2021-09-30 DIAGNOSIS — M79632 Pain in left forearm: Secondary | ICD-10-CM | POA: Diagnosis not present

## 2021-09-30 DIAGNOSIS — Z7951 Long term (current) use of inhaled steroids: Secondary | ICD-10-CM | POA: Diagnosis not present

## 2021-09-30 DIAGNOSIS — I10 Essential (primary) hypertension: Secondary | ICD-10-CM | POA: Diagnosis not present

## 2021-09-30 DIAGNOSIS — M542 Cervicalgia: Secondary | ICD-10-CM | POA: Diagnosis not present

## 2021-09-30 DIAGNOSIS — Z043 Encounter for examination and observation following other accident: Secondary | ICD-10-CM | POA: Diagnosis not present

## 2021-09-30 DIAGNOSIS — Z7982 Long term (current) use of aspirin: Secondary | ICD-10-CM | POA: Insufficient documentation

## 2021-09-30 DIAGNOSIS — S59912A Unspecified injury of left forearm, initial encounter: Secondary | ICD-10-CM | POA: Diagnosis present

## 2021-09-30 DIAGNOSIS — W19XXXA Unspecified fall, initial encounter: Secondary | ICD-10-CM

## 2021-09-30 DIAGNOSIS — S51812A Laceration without foreign body of left forearm, initial encounter: Secondary | ICD-10-CM | POA: Diagnosis not present

## 2021-09-30 DIAGNOSIS — Z79899 Other long term (current) drug therapy: Secondary | ICD-10-CM | POA: Insufficient documentation

## 2021-09-30 DIAGNOSIS — Y9301 Activity, walking, marching and hiking: Secondary | ICD-10-CM | POA: Diagnosis not present

## 2021-09-30 DIAGNOSIS — W108XXA Fall (on) (from) other stairs and steps, initial encounter: Secondary | ICD-10-CM | POA: Insufficient documentation

## 2021-09-30 DIAGNOSIS — M25512 Pain in left shoulder: Secondary | ICD-10-CM | POA: Insufficient documentation

## 2021-09-30 DIAGNOSIS — S0990XA Unspecified injury of head, initial encounter: Secondary | ICD-10-CM | POA: Diagnosis not present

## 2021-09-30 DIAGNOSIS — J449 Chronic obstructive pulmonary disease, unspecified: Secondary | ICD-10-CM | POA: Diagnosis not present

## 2021-09-30 MED ORDER — ACETAMINOPHEN 325 MG PO TABS
650.0000 mg | ORAL_TABLET | Freq: Once | ORAL | Status: AC
  Administered 2021-09-30: 650 mg via ORAL
  Filled 2021-09-30: qty 2

## 2021-09-30 MED ORDER — IBUPROFEN 400 MG PO TABS
600.0000 mg | ORAL_TABLET | Freq: Once | ORAL | Status: AC
  Administered 2021-09-30: 600 mg via ORAL
  Filled 2021-09-30: qty 1

## 2021-09-30 NOTE — ED Provider Notes (Signed)
[facility] EMERGENCY DEPT Provider Note   CSN: [id number] Arrival date & time: 09/30/21  1416     History  Chief Complaint  Patient presents with   Erin Lopez is a 71 y.o. female.   Fall    Patient with medical history of COPD, hypertension, hyperlipidemia, osteoporosis presents today due to fall.  Patient was walking up the stairs when she tripped landing on an outstretched left arm.  She is unsure if she hit her head but did not lose consciousness.  She has pain in her neck, left forearm, left shoulder.  Denies any prodromal symptoms, has a small cut/laceration to left anterior forearm.  Denies any paresthesias, saddle anesthesia, loss of bladder or bowel function.  Home Medications Prior to Admission medications   Medication Sig Start Date End Date Taking? Authorizing Provider  acetaminophen (TYLENOL) 500 MG tablet Take 1,000 mg by mouth every 6 (six) hours as needed for mild pain or headache.     [provider]  albuterol (VENTOLIN HFA) 108 (90 Base) MCG/ACT inhaler INHALE 2 PUFFS INTO THE LUNGS EVERY 4 HOURS AS NEEDED FOR WHEEZE OR FOR SHORTNESS OF BREATH 07/09/20   McGowen, Adrian Blackwater, MD  ALPRAZolam Duanne Moron) 0.5 MG tablet TAKE 1 TABLET BY MOUTH THREE TIMES A DAY AS NEEDED FOR ANXIETY 09/23/21   McGowen, Adrian Blackwater, MD  aspirin EC 81 MG EC tablet Take 1 tablet (81 mg total) by mouth daily. 05/17/18   Duke, Tami Lin, PA  atorvastatin (LIPITOR) 80 MG tablet TAKE 1 TABLET BY MOUTH DAILY AT 6 PM. 08/17/21   McGowen, Adrian Blackwater, MD  BESIVANCE 0.6 % SUSP Place 1 drop into the right eye 3 (three) times daily. Patient not taking: Reported on 09/17/2021 08/27/21   [provider]  citalopram (CELEXA) 20 MG tablet Take 1 tablet (20 mg total) by mouth in the morning and at bedtime. 10/29/20   McGowen, Adrian Blackwater, MD  cyclobenzaprine (FLEXERIL) 10 MG tablet TAKE 1 TABLET BY MOUTH TWICE A DAY 09/23/21   McGowen, Adrian Blackwater, MD  doxycycline (VIBRAMYCIN)  50 MG capsule Take 50 mg by mouth daily. 01/09/20   [provider]  famotidine (PEPCID) 40 MG tablet TAKE 1 TABLET BY MOUTH EVERY DAY 08/17/21   McGowen, Adrian Blackwater, MD  fluticasone (FLONASE) 50 MCG/ACT nasal spray SPRAY 2 SPRAYS INTO EACH NOSTRIL EVERY DAY 07/05/21   McGowen, Adrian Blackwater, MD  hydrochlorothiazide (HYDRODIURIL) 12.5 MG tablet TAKE 1 TABLET BY MOUTH EVERY DAY 12/30/20   Turner, Eber Hong, MD  ipratropium-albuterol (DUONEB) 0.5-2.5 (3) MG/3ML SOLN TAKE 3 MLS BY NEBULIZATION 3 (THREE) TIMES DAILY AS NEEDED. Patient not taking: Reported on 09/17/2021 07/13/20   Marshell Garfinkel, MD  lamoTRIgine (LAMICTAL) 100 MG tablet Take 3 tablets (300 mg total) by mouth at bedtime. 05/06/21   Tammi Sou, MD  losartan (COZAAR) 25 MG tablet TAKE 1 TABLET BY MOUTH EVERY DAY 02/22/21   Richardson Dopp T, PA-C  Magnesium 250 MG TABS Take 250 mg by mouth daily.     [provider]  metoprolol succinate (TOPROL-XL) 100 MG 24 hr tablet Take 1 tablet (100 mg total) by mouth daily. Take with or immediately following a meal. 12/30/20   Turner, Eber Hong, MD  montelukast (SINGULAIR) 10 MG tablet TAKE 1 TABLET BY MOUTH EVERY DAY IN THE EVENING 06/22/21   McGowen, Adrian Blackwater, MD  Multiple Vitamin (MULTI-VITAMINS) TABS Take 1 tablet by mouth daily.     [provider]  nitroGLYCERIN (NITROSTAT) 0.4 MG SL tablet Place 1 tablet (0.4 mg total) under the tongue every 5 (five) minutes x 3 doses as needed for chest pain. Patient not taking: Reported on 09/17/2021 05/16/18   Ledora Bottcher, PA  pantoprazole (PROTONIX) 40 MG tablet TAKE 1 TABLET BY MOUTH EVERY DAY 08/25/21   Pyrtle, Lajuan Lines, MD  polyethylene glycol powder (GLYCOLAX/MIRALAX) powder TAKE 17 G BY MOUTH DAILY. 06/19/17   McGowen, Adrian Blackwater, MD  prednisoLONE acetate (PRED FORTE) 1 % ophthalmic suspension Place 1 drop into the right eye 4 (four) times daily. Patient not taking: Reported on 09/17/2021 08/27/21   [provider]  PROLENSA 0.07 %  SOLN Place 1 drop into the right eye at bedtime. Patient not taking: Reported on 09/17/2021 08/27/21   [provider]  Propylene Glycol 0.6 % SOLN Apply 1 drop to eye 2 (two) times daily.    [provider]  tamsulosin (FLOMAX) 0.4 MG CAPS capsule Take 0.4 mg by mouth daily. 08/16/20   [provider]  Tiotropium Bromide-Olodaterol (STIOLTO RESPIMAT) 2.5-2.5 MCG/ACT AERS INHALE 2 PUFFS BY MOUTH INTO THE LUNGS DAILY 05/17/21   Mannam, Praveen, MD  triamcinolone cream (KENALOG) 0.1 % Apply 1 application topically daily. 05/21/18   [provider]  zolpidem (AMBIEN) 10 MG tablet TAKE 1 TABLET BY MOUTH EVERYDAY AT BEDTIME 05/06/21   McGowen, Adrian Blackwater, MD      Allergies    Ceftin [cefuroxime axetil], Ciprofloxacin, Fosamax [alendronate sodium], Morphine and related, Oxycodone, Prednisone, and Seroquel [quetiapine]    Review of Systems   Review of Systems  Physical Exam Updated Vital Signs BP (!) 178/117   Pulse 82   Temp 98.1 F (36.7 C)   Resp 18   Ht 5' 5.5" (1.664 m)   Wt 84 kg   SpO2 99%   BMI 30.35 kg/m  Physical Exam Vitals and nursing note reviewed. Exam conducted with a chaperone present.  Constitutional:      Appearance: Normal appearance.  HENT:     Head: Normocephalic.  Eyes:     General: No scleral icterus.       Right eye: No discharge.        Left eye: No discharge.     Extraocular Movements: Extraocular movements intact.     Pupils: Pupils are equal, round, and reactive to light.  Neck:     Comments: Patient kept in c-collar Cardiovascular:     Rate and Rhythm: Normal rate and regular rhythm.     Pulses: Normal pulses.     Heart sounds: Normal heart sounds. No murmur heard.    No friction rub. No gallop.  Pulmonary:     Effort: Pulmonary effort is normal. No respiratory distress.     Breath sounds: Normal breath sounds.  Abdominal:     General: Abdomen is flat. Bowel sounds are normal. There is no distension.     Palpations:  Abdomen is soft.     Tenderness: There is no abdominal tenderness.  Musculoskeletal:        General: Tenderness present.     Comments: Tenderness to left shoulder, left forearm.  Able to move wrist bilaterally and right upper and lower extremity without difficulty.  She is ambulatory  Skin:    General: Skin is warm and dry.     Capillary Refill: Capillary refill takes less than 2 seconds.     Coloration: Skin is not jaundiced.     Comments: 2 cm skin  tear/laceration to left forearm.  Bleeding controlled, relatively superficial  Neurological:     Mental Status: She is alert. Mental status is at baseline.     Coordination: Coordination normal.     Comments: Cranial nerves III through XII are grossly intact, grips and physical bilaterally.  Upper and lower extremity strength symmetric 5/5 against resistance.     ED Results / Procedures / Treatments   Labs (all labs ordered are listed, but only abnormal results are displayed) Labs Reviewed - No data to display  EKG None  Radiology CT Head Wo Contrast  Result Date: 09/30/2021 CLINICAL DATA:  Mixed soreness after fall. History of cervical fusion. EXAM: CT HEAD WITHOUT CONTRAST CT CERVICAL SPINE WITHOUT CONTRAST TECHNIQUE: Multidetector CT imaging of the head and cervical spine was performed following the standard protocol without intravenous contrast. Multiplanar CT image reconstructions of the cervical spine were also generated. RADIATION DOSE REDUCTION: This exam was performed according to the departmental dose-optimization program which includes automated exposure control, adjustment of the mA and/or kV according to patient size and/or use of iterative reconstruction technique. COMPARISON:  CT head and cervical spine dated January 14, 2021. FINDINGS: CT HEAD FINDINGS Brain: No evidence of acute infarction, hemorrhage, hydrocephalus, extra-axial collection or mass lesion/mass effect. Stable mild atrophy and chronic microvascular ischemic  changes. Vascular: Atherosclerotic vascular calcification of the carotid siphons. No hyperdense vessel. Skull: Normal. Negative for fracture or focal lesion. Sinuses/Orbits: No acute finding. Other: None. CT CERVICAL SPINE FINDINGS Alignment: No traumatic malalignment. Unchanged degenerative facet mediated 5 mm anterolisthesis at C2-C3 and 3 mm anterolisthesis at C7-T1. Skull base and vertebrae: No acute fracture. No primary bone lesion or focal pathologic process. Soft tissues and spinal canal: No prevertebral fluid or swelling. No visible canal hematoma. Disc levels: Prior C3-C7 ACDF status post C6 and C7 hardware removal. Solid osseous fusion from C5-C7. 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 Prior left laminectomy at C7. Unchanged severe left facet uncovertebral hypertrophy at C2-C3. Upper chest: Centrilobular emphysema again noted. Other: None. IMPRESSION: 1. No acute intracranial abnormality. Stable mild atrophy and chronic microvascular ischemic changes. 2. No acute cervical spine fracture or traumatic listhesis. 3. Prior C3-C7 ACDF with solid osseous fusion from C5-C7 and pseudoarthrosis at C3-C4 and C4-C5. 4. Emphysema (ICD10-J43.9). Electronically Signed   By: Titus Dubin M.D.   On: 09/30/2021 17:06   CT Cervical Spine Wo Contrast  Result Date: 09/30/2021 CLINICAL DATA:  Mixed soreness after fall. History of cervical fusion. EXAM: CT HEAD WITHOUT CONTRAST CT CERVICAL SPINE WITHOUT CONTRAST TECHNIQUE: Multidetector CT imaging of the head and cervical spine was performed following the standard protocol without intravenous contrast. Multiplanar CT image reconstructions of the cervical spine were also generated. RADIATION DOSE REDUCTION: This exam was performed according to the departmental dose-optimization program which includes automated exposure control, adjustment of the mA and/or kV according to patient size and/or use of iterative reconstruction technique. COMPARISON:  CT head and  cervical spine dated January 14, 2021. FINDINGS: CT HEAD FINDINGS Brain: No evidence of acute infarction, hemorrhage, hydrocephalus, extra-axial collection or mass lesion/mass effect. Stable mild atrophy and chronic microvascular ischemic changes. Vascular: Atherosclerotic vascular calcification of the carotid siphons. No hyperdense vessel. Skull: Normal. Negative for fracture or focal lesion. Sinuses/Orbits: No acute finding. Other: None. CT CERVICAL SPINE FINDINGS Alignment: No traumatic malalignment. Unchanged degenerative facet mediated 5 mm anterolisthesis at C2-C3 and 3 mm anterolisthesis at C7-T1. Skull base and vertebrae: No acute fracture. No primary bone lesion or focal pathologic process.  Soft tissues and spinal canal: No prevertebral fluid or swelling. No visible canal hematoma. Disc levels: Prior C3-C7 ACDF status post C6 and C7 hardware removal. Solid osseous fusion from C5-C7. 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 Prior left laminectomy at C7. Unchanged severe left facet uncovertebral hypertrophy at C2-C3. Upper chest: Centrilobular emphysema again noted. Other: None. IMPRESSION: 1. No acute intracranial abnormality. Stable mild atrophy and chronic microvascular ischemic changes. 2. No acute cervical spine fracture or traumatic listhesis. 3. Prior C3-C7 ACDF with solid osseous fusion from C5-C7 and pseudoarthrosis at C3-C4 and C4-C5. 4. Emphysema (ICD10-J43.9). Electronically Signed   By: Titus Dubin M.D.   On: 09/30/2021 17:06   DG Shoulder Left  Result Date: 09/30/2021 CLINICAL DATA:  Fall. EXAM: LEFT SHOULDER - 2+ VIEW COMPARISON:  None Available. FINDINGS: There is no evidence for acute fracture or dislocation. There is no evidence of arthropathy or other focal bone abnormality. Soft tissues are unremarkable. IMPRESSION: Negative. Electronically Signed   By: Ronney Asters M.D.   On: 09/30/2021 16:19   DG Forearm Left  Result Date: 09/30/2021 CLINICAL DATA:  [place] [address] forward  striking LEFT forearm EXAM: LEFT FOREARM - 2 VIEW COMPARISON:  None FINDINGS: Osseous demineralization. Degenerative changes at STT joint and first CMC joint. Wrist and elbow joint alignments otherwise normal. No acute fracture, dislocation, or bone destruction. IMPRESSION: No acute osseous abnormalities. Osseous demineralization with degenerative changes at radial border of LEFT carpus. Electronically Signed   By: Lavonia Dana M.D.   On: 09/30/2021 15:32    Procedures Procedures    Medications Ordered in ED Medications  acetaminophen (TYLENOL) tablet 650 mg (650 mg Oral Given 09/30/21 1617)  ibuprofen (ADVIL) tablet 600 mg (600 mg Oral Given 09/30/21 1738)    ED Course/ Medical Decision Making/ A&P                           Medical Decision Making Amount and/or Complexity of Data Reviewed Radiology: ordered.  Risk OTC drugs.   Patient presents due to fall.  Differential is broad but includes musculoskeletal injury, fractures, intracranial bleed, C-spine injury.  No focal deficits on neuro exam, no physical findings suggestive of trauma other than the laceration to your posterior forearm.  Roughly 2 cm laceration does not extend to the muscle layer.  The skin edges are well approximated, there is no gaping wound.   I ordered and reviewed imaging.  CT head and C-spine negative for any acute process, plain films of shoulder and forearm are negative.  Patient does not have any pain elsewhere, good pulses and abdomen is soft without guarding.  Considered laceration repair but given how superficial the wound is do not feel really indicated.  Discussed wound care with patient and applied Steri-Strips.  Wound was dressed.  On reevaluation patient is still having pain but no paresthesias.  No saddle anesthesia, urinary incontinence or retention.  At this time I feel she is appropriate for outpatient follow-up, she has a primary care doctor who she is able to see.        Final Clinical  Impression(s) / ED Diagnoses Final diagnoses:  Fall, initial encounter    Rx / DC Orders ED Discharge Orders     None         Sherrill Raring, Hershal Coria 09/30/21 2209    Gareth Morgan, MD 10/01/21 [phone]

## 2021-09-30 NOTE — ED Triage Notes (Addendum)
Patient here POV from Home.  Endorses Falling today at approximately 1200 today. Patient was ambulating up the Steps when she tripped and fell forward onto Left Arm. Endorses No Head or neck Injury but endorses Soreness to Neck and Body.   3-4 cm Laceration with Bleeding controlled to Posterior Left Forearm. Unknown Tetanus Status. No LOC. No Anticoagulants.  NAD Noted during Triage. A&Ox4. GCS 15. Ambulatory.

## 2021-09-30 NOTE — Discharge Instructions (Signed)
There were no fractures or dislocations on the scan in your head and neck are well-appearing.  The Steri-Strips should be left on until they fall off.  Keep dry for 24 hours, do not submerge fully in water.  If you develop numbness on one side of your body, loss of bladder or bowel function, saddle anesthesia or numbness to your legs return back to the ED for further evaluation.

## 2021-10-01 ENCOUNTER — Other Ambulatory Visit: Payer: Self-pay | Admitting: Cardiology

## 2021-10-06 ENCOUNTER — Telehealth: Payer: Self-pay | Admitting: *Deleted

## 2021-10-06 NOTE — Telephone Encounter (Signed)
   Pre-operative Risk Assessment    Patient Name: Erin Lopez  DOB: 10/19/1950 MRN: [id number]      Request for Surgical Clearance    Procedure:   RIGHT KNEE POLY VS TOTAL KNEE REVISION  Date of Surgery:  Clearance 01/19/22                                 Surgeon:  DR. Gaynelle Arabian Surgeon's Group or Practice Name:  [facility] Phone number:  [phone] Fax number:  [phone] ATTN:  [name]   Type of Clearance Requested:   - Pharmacy:  Hold Aspirin NOT INDICATED   Type of Anesthesia:   CHOICE   Additional requests/questions:    Astrid Divine   10/06/2021, 10:06 AM

## 2021-10-06 NOTE — Telephone Encounter (Signed)
   Name: Erin Lopez  DOB: 1950/08/21  MRN: [id number]  Primary Cardiologist: Fransico Him, MD   Preoperative team, please contact this patient and set up a phone call appointment on or after 11/19/2021 for further preoperative risk assessment. Please obtain consent and complete medication review. Thank you for your help.  I confirm that guidance regarding antiplatelet and oral anticoagulation therapy has been completed and, if necessary, noted below.   Lenna Sciara, NP 10/06/2021, 11:34 AM [facility] [address] [place], [place] [address]

## 2021-10-07 ENCOUNTER — Telehealth: Payer: Self-pay | Admitting: *Deleted

## 2021-10-07 ENCOUNTER — Other Ambulatory Visit: Payer: Self-pay | Admitting: Cardiology

## 2021-10-07 NOTE — Telephone Encounter (Signed)
I s/w the pt and she is agreeable to plan of care for tele visit 12/10/21 @ 2 pm. . Med rec and consent are done.  Pt also needed help getting back in to [facility]. I have re-set her PW for my chart. Pt thanked me for the help.      Patient Consent for Virtual Visit        Erin Lopez has provided verbal consent on 10/07/2021 for a virtual visit (video or telephone).   CONSENT FOR VIRTUAL VISIT FOR:  Erin Lopez  By participating in this virtual visit I agree to the following:  I hereby voluntarily request, consent and authorize [facility] and its employed or contracted physicians, physician assistants, nurse practitioners or other licensed health care professionals (the Practitioner), to provide me with telemedicine health care services (the "Services") as deemed necessary by the treating Practitioner. I acknowledge and consent to receive the Services by the Practitioner via telemedicine. I understand that the telemedicine visit will involve communicating with the Practitioner through live audiovisual communication technology and the disclosure of certain medical information by electronic transmission. I acknowledge that I have been given the opportunity to request an in-person assessment or other available alternative prior to the telemedicine visit and am voluntarily participating in the telemedicine visit.  I understand that I have the right to withhold or withdraw my consent to the use of telemedicine in the course of my care at any time, without affecting my right to future care or treatment, and that the Practitioner or I may terminate the telemedicine visit at any time. I understand that I have the right to inspect all information obtained and/or recorded in the course of the telemedicine visit and may receive copies of available information for a reasonable fee.  I understand that some of the potential risks of receiving the Services via telemedicine include:  Delay or  interruption in medical evaluation due to technological equipment failure or disruption; Information transmitted may not be sufficient (e.g. poor resolution of images) to allow for appropriate medical decision making by the Practitioner; and/or  In rare instances, security protocols could fail, causing a breach of personal health information.  Furthermore, I acknowledge that it is my responsibility to provide information about my medical history, conditions and care that is complete and accurate to the best of my ability. I acknowledge that Practitioner's advice, recommendations, and/or decision may be based on factors not within their control, such as incomplete or inaccurate data provided by me or distortions of diagnostic images or specimens that may result from electronic transmissions. I understand that the practice of medicine is not an exact science and that Practitioner makes no warranties or guarantees regarding treatment outcomes. I acknowledge that a copy of this consent can be made available to me via my patient portal ([facility]), or I can request a printed copy by calling the office of [facility].    I understand that my insurance will be billed for this visit.   I have read or had this consent read to me. I understand the contents of this consent, which adequately explains the benefits and risks of the Services being provided via telemedicine.  I have been provided ample opportunity to ask questions regarding this consent and the Services and have had my questions answered to my satisfaction. I give my informed consent for the services to be provided through the use of telemedicine in my medical care

## 2021-10-07 NOTE — Telephone Encounter (Signed)
I s/w the pt and she is agreeable to plan of care for tele visit 12/10/21 @ 2 pm. . Med rec and consent are done.  Pt also needed help getting back in to [facility]. I have re-set her PW for my chart. Pt thanked me for the help.

## 2021-10-12 ENCOUNTER — Ambulatory Visit (INDEPENDENT_AMBULATORY_CARE_PROVIDER_SITE_OTHER): Payer: Medicare Other | Admitting: Physical Therapy

## 2021-10-12 ENCOUNTER — Encounter: Payer: Self-pay | Admitting: Physical Therapy

## 2021-10-12 DIAGNOSIS — M25675 Stiffness of left foot, not elsewhere classified: Secondary | ICD-10-CM | POA: Diagnosis not present

## 2021-10-12 DIAGNOSIS — R262 Difficulty in walking, not elsewhere classified: Secondary | ICD-10-CM | POA: Diagnosis not present

## 2021-10-12 DIAGNOSIS — M79675 Pain in left toe(s): Secondary | ICD-10-CM

## 2021-10-12 NOTE — Therapy (Signed)
OUTPATIENT PHYSICAL THERAPY LOWER EXTREMITY TREATMENT   Patient Name: LENETTE RAU MRN: [id number] DOB:04/09/1950, 71 y.o., female Today's Date: 10/12/2021   PT End of Session - 10/12/21 1104     Visit Number 3    Number of Visits 13    Date for PT Re-Evaluation 12/22/21    Authorization Type U[employer] MCR    PT Start Time 1105    PT Stop Time 1145    PT Time Calculation (min) 40 min    Activity Tolerance Patient tolerated treatment well    Behavior During Therapy WFL for tasks assessed/performed             Past Medical History:  Diagnosis Date   Alcoholism in remission ([place])    states last alcohol 11 yrs ago   Anemia    Anxiety    Bipolar 1 disorder ([place])    Admission for mania x 2 (most recent 11/2017)   Cervical spondylosis without myelopathy 12/19/2013   MRI 02/2014: multilevel DDD/spondylosis, not much change compared to prior MRI.  Pt not in favor of invasive therapy for her neck as of 04/2014.   Cholelithiasis    COPD (chronic obstructive pulmonary disease) (HCC)    Moderate: anoro started 04/2017 by pulm, pt symptomatically improved and PFTs stable at f/u 06/2017.   Coronary artery spasm ([place]) 07/11/2018   nitrates=HA. Amlodipine=intol swelling.  Changed to coreg 09/03/18 by cardiologist.   Depression    Dilutional hyponatremia    Endometritis 05/2017   possible; empiric tx with Flagyl by GYN.   Environmental allergies    Fibromyalgia    GERD (gastroesophageal reflux disease)    Hepatic steatosis 03/2019   u/s abd   Herpes zoster 08/07/2017   L scapular region   Hiatal hernia    History of adenomatous polyp of colon 04/2008; 08/2014   No high grade dysplasia: recall 5 yrs ([name], [name])   History of basal cell carcinoma excision    NOSE   History of Helicobacter pylori infection 04/2008   +gastric biopsy (gastritis but no metaplasia, dysplasia, or malignancy identified)   History of kidney stones    History of TIA (transient ischemic attack)     secondary to HELLP syndrome 1994 post c/s--  residual memory loss   Hyperlipidemia    statin started after her NSTEMI: goal  LDL <70.   Hypertension    Internal hemorrhoids    Ischemic cardiomyopathy 05/2018   Echo EF 45-50% in context of NSTEMI from CA spasm. 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 04/2017.   Leg pain    ABIs 11/2019: normal    Memory loss    Abnl MRI brain and CT brain c/w chronic microvascular ischemia.  Repeat MRI brain 02/2014 stable (Dr. Mayra Reel with no plan to f/u with neuro as of 04/2014)   NSTEMI (non-ST elevated myocardial infarction) ([place]) 05/2018   Echo EF 45-50% in context of NSTEMI from CA spasm.// Myoview 8/21: EF 66, no ischemia, low risk   Osteoporosis 05/2010   2012 'penia; 06/2015 'porosis:  prolia.  DEXA 09/2017 T-score -2.2 femur neck.  As of 11/2018 we're restarting prolia soon and will check next DEXA 1 yr from her next prolia injection.   Pelvic floor dysfunction    [facility]  urol ([name])   Pneumonia    Postmenopausal vaginal bleeding 05/2017   x 1 small episode; GYN attempted endo bx but unable to penetrate cervix due to severe cerv stenosis (due to postmenopausal state + hx of LEEP).  Endo u/s showed thin endo lining.  Per GYN---no evidence of endomet pathology on w/u---obs as of 07/2017.   Prediabetes 06/2016   Fasting gluc 105; HbA1c at that time was 6.0%.  A1c 6.2% 12/2017.  A1c 5.7% Feb 2023.   Recurrent kidney stones 08/2013   Left ureteral calculus: Perc nephr--cystoscopy w/ureteroscopy + laser for stone removal.  Residual asymptomatic left renal nephrolithiasis <55m present post-procedure.  Right sided hydronephrosis-persistent (on u/s)--urol ordered CT to further eval 08/2016: 1.6 cm  nonobstructing stone lower pole left kidney, no hydro, plan for PCN extraction ([facility])   Shortness of breath    Sprain of neck 12/19/2013   Stroke (HCC)    TIA   TMJ (temporomandibular joint disorder)    USES  MOUTH GUARD   Toe fracture, left 12/2017   2nd toe prox phalanx (immobilization, Dr. RZadie Rhine   Tubular adenoma of colon    Vitamin D deficiency 05/2011   Weak urinary stream 07/2016   with elevated PVR and mild left hydronephrosis secondary to this ([facility] started flomax 0.'4mg'$  qd for this).   Past Surgical History:  Procedure Laterality Date   ABIs  11/29/2019   NORMAL   ANTERIOR CERVICAL DECOMP/DISCECTOMY FUSION  06/30/2009   C3 -- C5  AND EXPLORATION OF FUSION C5-7 W/  PLATE REMOVAL   ARTHRODESIS METATARSALPHALANGEAL JOINT (MTPJ) Left 07/08/2021   Procedure: Left hallux metatarsophalangeal joint arthrodesis;  Surgeon: HWylene Simmer MD;  [facility]  Service: Orthopedics;  Laterality: Left;   BACK SURGERY     BUNIONECTOMY Left    CARDIOVASCULAR STRESS TEST  12/03/2019   myoc perf im: NORMAL   CERVICAL FUSION  2002   anterior C5 -- C7   CERVICAL SPINE SURGERY  08/27/1999     C6 -- T1  LAMINECTOMY/  DISKECTOMY   CESAREAN SECTION  1994   CHOLECYSTECTOMY N/A 03/06/2020   Procedure: LAPAROSCOPIC CHOLECYSTECTOMY;  Surgeon: SFelicie Morn MD;  [facility]  Service: General;  Laterality: N/A;   COLONOSCOPY W/ POLYPECTOMY  04/2008;08/2014   2016 tubular adenoma x 1; +diverticulosis and int/ext hemorrhoids.  06/2020 adenoma, recall 5 yrs.   CYSTOSCOPY WITH URETEROSCOPY AND STENT PLACEMENT Left 08/28/2013   Procedure: CYSTOSCOPY, RGP,  WITH URETEROSCOPY AND STENT REMOVAL;  Surgeon: DBernestine Amass MD;  [facility]  Service: Urology;  Laterality: Left;   DEXA  09/2017   T-score -2.2 femur neck: improved compared to 06/2015.   ESOPHAGOGASTRODUODENOSCOPY  2010; 08/2014   2016 +Candidal esophagitis; Mild chronic  gastritis w/intestinal metaplasia--NEG H pylori, neg for eosinophilic esoph. 30/1027esoph candidiasis (diflucan x 20d), o/w normal.   HAMMER TOE SURGERY Left 07/08/2021   Procedure: Second metatarsal head excision; revision Second hammertoe correction; 2-5 percutaneus flexor tenotomies;  Surgeon: HWylene Simmer MD;  [facility]  Service: Orthopedics;  Laterality: Left;   HARDWARE REMOVAL Left 07/08/2021   Procedure: Removal of deep implants from hallux proximal phalanx and First and Second metatarsals;  Surgeon: HWylene Simmer MD;  [facility]  Service: Orthopedics;  Laterality: Left;   HOLMIUM LASER APPLICATION Left 025/36/6440  Procedure: HOLMIUM LASER APPLICATION;  Surgeon: DBernestine Amass MD;  [facility]  Service:  Urology;  Laterality: Left;   JOINT REPLACEMENT     LEFT HEART CATH AND CORONARY ANGIOGRAPHY N/A 05/15/2018   Evidence for spasm in distal LAD.  Mod RCA atherosclerosis, o/w no CAD.  Procedure: LEFT HEART CATH AND CORONARY ANGIOGRAPHY;  Surgeon: Leonie Man, MD;  Location: [facility];  Service: Cardiovascular;  Laterality: N/A;   NEGATIVE SLEEP STUDY  04/01/2007   NEPHROLITHOTOMY Left 08/05/2013   Procedure: NEPHROLITHOTOMY PERCUTANEOUS;  Surgeon: [name], MD;  Location[facility];  Service: Urology;  Laterality: Left;   POLYSOMNOGRAM  01/2019   NORMAL   POLYSOMNOGRAPHY  10/25/2018   Dr. Dion Saucier due to pt inadequat sleep time (83 min).   TONSILLECTOMY AND ADENOIDECTOMY  1975   TOTAL KNEE ARTHROPLASTY Right 2003   TRANSTHORACIC ECHOCARDIOGRAM  05/2018; 12/03/2019   (after MI secondary to arterial spasm): EF 45-50%; severe hypokinesis of apical anterolateral, inferolateral , anterior, and inferior LV myocardium. 11/2019 EF 55-60%, valves fine   ULTRASOUND EXAM,PELVIC COMPLETE ([place] [facility])  06/25/2015   NORMAL (done by GYN)   Patient Active Problem List   Diagnosis Date  Noted   Shortness of breath 04/19/2019   Healthcare maintenance 04/19/2019   Overweight (BMI 25.0-29.9) 02/19/2019   Ischemic cardiomyopathy 09/03/2018   Chest pain 07/11/2018   Coronary vasospasm ([place]) 07/11/2018   NSTEMI (non-ST elevated myocardial infarction) ([place]) 05/14/2018   Acute encephalopathy 11/19/2017   Hyponatremia 11/16/2017   Bipolar 1 disorder ([place]) 12/29/2016   Hypertension 12/29/2016   TIA (transient ischemic attack) 12/29/2016   Chronic pelvic pain in female 08/22/2016   Stage 2 chronic kidney disease 08/22/2016   Osteoporosis 09/30/2015   Health maintenance examination 06/11/2015   Suprapubic discomfort 06/05/2015   Urethral caruncle 06/05/2015   Infection of urinary tract 06/05/2015   TMJ (temporomandibular joint disorder) 02/25/2015   Right ankle injury 11/05/2014   Cervical spondylosis without myelopathy 12/19/2013   Sprain of neck 12/19/2013   Memory difficulties 12/19/2013   Postnasal drip 10/28/2013   Renal calculus 08/05/2013   Urinary frequency 08/01/2013   Constipation, chronic 07/29/2013   COPD (chronic obstructive pulmonary disease) ([place]) 06/30/2013   Abnormal chest x-ray 06/30/2013   Benign essential HTN 05/29/2013   Kidney stone on left side 05/29/2013   Hyperlipidemia 05/29/2013    PCP: Tammi Sou, MD  REFERRING PROVIDER: Corky Sing, PA-C  REFERRING DIAG: M20.22 (ICD-10-CM) - Hallux rigidus, left foot  Procedure(s): 1.  Left first metatarsal removal of deep implant 2.  Left hallux proximal phalanx removal of deep implant 3.  Left hallux metatarsophalangeal joint arthrodesis 4.  Removal of deep implant from the left second metatarsal 5.  Left second metatarsal head excision 6.  Left second hammertoe correction 7.  Left third, fourth and fifth toe flexor digitorum longus tenotomies 8.  AP, lateral and oblique radiographs of the left foot  THERAPY DIAG:  Stiffness of left foot, not elsewhere classified  Pain in left  toe(s)  Difficulty walking  Rationale for Evaluation and Treatment Rehabilitation  ONSET DATE: 07/08/2021 Surgery  SUBJECTIVE:   SUBJECTIVE STATEMENT: 10/12/2021 Pt states less pain in toes. She did obtain new shoes/[place], has been wearing for over a week with good comfort. She did have a fall up the stair on 6/29, was seen in ER. States mild shoulder pain at times and some headaches. CT s at ER were Neg.    Eval: Pt states that she has L foot pain and swelling since having surgery. Pain is the  top of the foot and lateral. Pt has pain all the time walking, big toe mostly with walking. Putting her shoe on it hurts. She states her current shoes are too small and old. Pt has pain with any walking. Pushing off on the foot hurts. Pt denies red flag symptoms. Pt states she mainly sedentary due to the foot pain.   PERTINENT HISTORY: C/S surgery, Stroke, TIA, Hallux deformity+adhesive capsulitis+ hammertoe:  Severe structural bunion deformity with hallux interphalangeus and severe arthritis of the second MPJ left foot--surgical repair/osteotomies by [name] 04/2017.  PAIN:  Are you having pain? Yes: NPRS scale: 5/10 Pain location: L big toe Pain description: throbbing, sharp Aggravating factors: any movement Relieving factors: resting   PRECAUTIONS: None  WEIGHT BEARING RESTRICTIONS No  FALLS:  Has patient fallen in last 6 months? No  LIVING ENVIRONMENT: Lives with: lives alone Lives in: House/apartment Stairs: 1st level Has following equipment at home: None  OCCUPATION: N/A  PLOF: Independent  PATIENT GOALS : Pt would like to get more mobile and improve walking.    OBJECTIVE:   DIAGNOSTIC FINDINGS: N/A  PATIENT SURVEYS:  FOTO 28 41 DC 19 pts MCII  COGNITION:  Overall cognitive status: Within functional limits for tasks assessed     SENSATION: Light touch: Impaired   EDEMA:  Moderate edema between 2nd and 3rd digit, non pitting   POSTURE: increased thoracic  kyphosis, flexed trunk , and weight shift right, pes planus  PALPATION: TTP dorsum of lateral foot along 3-5th rays, great toe plantar surface TTP and joint mobilization into ext  LOWER EXTREMITY ROM:  MMT Right eval Left eval  Ankle dorsiflexion 10 3  Ankle plantarflexion [facility]  Ankle inversion 25 20  Ankle eversion 20 10   (Blank rows = not tested)  Great toe extension on L: 5 deg  LOWER EXTREMITY MMT:  Passive ROM Right eval Left eval  Ankle dorsiflexion 4+/5 4/5 p!  Ankle plantarflexion 4+/5 4/5 p!  Ankle inversion 4+/5 4-/5 p!  Ankle eversion 4+/5 4-/5 p!   (Blank rows = not tested)   FUNCTIONAL TESTS:  5 times sit to stand: unable to perform due to L foot pain  GAIT: Distance walked: 69f Assistive device utilized: None Level of assistance: Complete Independence Comments: decreased toe off on L, early heel off, lack of distinct heel strike; pain throughout  TODAY'S TREATMENT:  10/12/21 Therapeutic Exercise: Aerobic: Supine:  Seated: Great toe flexion to floor,  YTB x 15;  sit to stand with GTB at thighs; Seated clams GTB x 20;  Seated HR x 20 on L.  Standing:  March x 20;  ambulation 45 ft x 6 with education on step height and heel to toe pattern.  Stretches: Gastroc stretch at wall/counter 30 sec x 3 bil;  Neuromuscular Re-education: Manual Therapy: Self Care:    PATIENT EDUCATION:  Education details: reviewed and updated HEP Person educated: Patient Education method: Explanation, Demonstration, Tactile cues, Verbal cues, and Handouts Education comprehension: verbalized understanding, returned demonstration, verbal cues required, and tactile cues required   HOME EXERCISE PROGRAM:  Access Code: [email or web address]  ASSESSMENT:  CLINICAL IMPRESSION: Pt wearing new footwear with goof fit and comfort. She has less pain in toe overall.  HEP reviewed, pt requires max Cueing for optimal mechanics. She has improved gait pattern and mechanics with improved  knee extension and foot clearance, after education and practice today.  She has onset of temporal headache after standing activity/ther ex, states this has been happening  since her fall. She thinks she has increased tension in neck that is contributing. Recommended she f/u with PCP post fall to discuss. Recommended use of heating pad and shoulder rolls for tension relief in UT s. Denies other symptoms today. BP at 144/80 after onset of headache. Lessened after sitting/rest for 3 min.  Pt also reports more difficulty with memory and work finding, this has been her baseline, but notes worsening. I do not think this has been since fall but just overall.  Recommended she also discuss this with PCP.  Plan to progress standing stability/balance as tolerated. Not progressed today, due to onset of headache.    Discussed footwear today, pt will go try on a few things and see what fits best, discussed importance of wide enough toe box and option for rocker bottom. Reviewed HEP. Pt with dysfunction on L foot, due to significant surgery and ongoing deficits, will benefit from continued care for mobility and strengthening.  Will be very helpful to improve deficits prior to scheduled knee surgery in the fall.    OBJECTIVE IMPAIRMENTS Abnormal gait, decreased activity tolerance, decreased balance, decreased coordination, decreased endurance, decreased knowledge of condition, decreased mobility, difficulty walking, decreased ROM, decreased strength, hypomobility, impaired flexibility, impaired sensation, improper body mechanics, postural dysfunction, and pain.   ACTIVITY LIMITATIONS carrying, lifting, standing, squatting, stairs, transfers, and locomotion level  PARTICIPATION LIMITATIONS: meal prep, cleaning, laundry, interpersonal relationship, driving, shopping, community activity, yard work, and exercise  PERSONAL FACTORS Age, Behavior pattern, Fitness, Past/current experiences, Time since onset of  injury/illness/exacerbation, and 3+ comorbidities:    are also affecting patient's functional outcome.   REHAB POTENTIAL: Fair    CLINICAL DECISION MAKING: Evolving/moderate complexity  EVALUATION COMPLEXITY: Moderate   GOALS:   SHORT TERM GOALS: Target date: 11/04/2021   Pt will become independent with HEP in order to demonstrate synthesis of PT education.   Goal status: INITIAL  2.  Pt will report at least 2 pt reduction on NPRS scale for pain in order to demonstrate functional improvement with household activity, self care, and ADL.   Goal status: INITIAL  3.  Pt will score at least 19 pt increase on FOTO to demonstrate functional improvement in MCII and pt perceived function.    Goal status: INITIAL    LONG TERM GOALS: Target date: 12/16/2021   Pt  will become independent with final HEP in order to demonstrate synthesis of PT education.   Goal status: INITIAL  2.  Pt will score >/= 41 on FOTO to demonstrate improvement in perceived L foot function.   Goal status: INITIAL  3.  Pt will be able to demonstrate/report ability to walk >15 mins without pain in order to demonstrate functional improvement and tolerance to exercise and community mobility.   Goal status: INITIAL  4.  Pt will be able to perform 5XSTS in under 12s  in order to demonstrate functional improvement above the cut off score for adults.   Goal status: INITIAL   PLAN: PT FREQUENCY: 1-2x/week  PT DURATION: 12 weeks  PLANNED INTERVENTIONS: Therapeutic exercises, Therapeutic activity, Neuromuscular re-education, Balance training, Gait training, Patient/Family education, Joint manipulation, Joint mobilization, Stair training, Orthotic/Fit training, DME instructions, Aquatic Therapy, Dry Needling, Electrical stimulation, Spinal manipulation, Spinal mobilization, Moist heat, scar mobilization, Splintting, Taping, Vasopneumatic device, Traction, Ultrasound, Ionotophoresis '4mg'$ /ml Dexamethasone, Manual  therapy, and Re-evaluation  PLAN FOR NEXT SESSION: joint mob to 1st toe and L ankle, review of HEP, progression of L ankle stability, progress standing balance/stability as tolerated.  Lyndee Hensen, PT, DPT 11:54 AM  10/12/21

## 2021-10-15 ENCOUNTER — Ambulatory Visit (INDEPENDENT_AMBULATORY_CARE_PROVIDER_SITE_OTHER): Payer: Medicare Other | Admitting: Family Medicine

## 2021-10-15 ENCOUNTER — Encounter: Payer: Self-pay | Admitting: Family Medicine

## 2021-10-15 VITALS — BP 116/72 | HR 69 | Temp 97.2°F | Ht 65.5 in | Wt 185.6 lb

## 2021-10-15 DIAGNOSIS — Z23 Encounter for immunization: Secondary | ICD-10-CM | POA: Diagnosis not present

## 2021-10-15 DIAGNOSIS — S46812D Strain of other muscles, fascia and tendons at shoulder and upper arm level, left arm, subsequent encounter: Secondary | ICD-10-CM

## 2021-10-15 DIAGNOSIS — S161XXD Strain of muscle, fascia and tendon at neck level, subsequent encounter: Secondary | ICD-10-CM

## 2021-10-15 DIAGNOSIS — M25512 Pain in left shoulder: Secondary | ICD-10-CM

## 2021-10-15 MED ORDER — MELOXICAM 15 MG PO TABS: 15.0000 mg | ORAL_TABLET | Freq: Every day | ORAL | 0 refills | Status: DC

## 2021-10-15 MED ORDER — OXYCODONE HCL 5 MG PO TABS: ORAL_TABLET | ORAL | 0 refills | Status: DC

## 2021-10-15 NOTE — Addendum Note (Signed)
Addended by: Octaviano Glow on: 10/15/2021 03:16 PM   Modules accepted: Orders

## 2021-10-15 NOTE — Progress Notes (Signed)
OFFICE VISIT  10/15/2021  CC:  Chief Complaint  Patient presents with   Follow-up    ED; still has pain in neck and left shoulder. Had physical therapy this past Tuesday and next appt 7/18    Patient is a 71 y.o. female who presents for ER follow-up for a fall.  HPI: [place] 09/30/2021 for a fall while tripping on the landing of her stairs.  She went forward onto outstretched arms.  At the ER she had some neck pain as well as left shoulder and arm pain. CT head and neck negative for acute problem. Plain films of shoulder and forearm negative for acute abnormality.  She had a small skin tear on the left forearm that did not require suturing.  CURRENTLY Still having some significant level of pain over the left trapezius and left lower neck soft tissue region.  This extends over the top of the left shoulder as well.  Constant ache.  Not sharp.  No radiation down the arm.  No paresthesias. She is taking occasional dose of Advil as well as occasional dose of Tylenol. Says her skin tear on left arm is healed.  Past Medical History:  Diagnosis Date   Alcoholism in remission [facility])    states last alcohol 11 yrs ago   Anemia    Anxiety    Bipolar 1 disorder ([place])    Admission for mania x 2 (most recent 11/2017)   Cervical spondylosis without myelopathy 12/19/2013   MRI 02/2014: multilevel DDD/spondylosis, not much change compared to prior MRI.  Pt not in favor of invasive therapy for her neck as of 04/2014.   Cholelithiasis    COPD (chronic obstructive pulmonary disease) (HCC)    Moderate: anoro started 04/2017 by pulm, pt symptomatically improved and PFTs stable at f/u 06/2017.   Coronary artery spasm ([place]) 07/11/2018   nitrates=HA. Amlodipine=intol swelling.  Changed to coreg 09/03/18 by cardiologist.   Depression    Dilutional hyponatremia    Endometritis 05/2017   possible; empiric tx with Flagyl by GYN.   Environmental allergies    Fibromyalgia    GERD (gastroesophageal  reflux disease)    Hepatic steatosis 03/2019   u/s abd   Herpes zoster 08/07/2017   L scapular region   Hiatal hernia    History of adenomatous polyp of colon 04/2008; 08/2014   No high grade dysplasia: recall 5 yrs ([name], [name])   History of basal cell carcinoma excision    NOSE   History of Helicobacter pylori infection 04/2008   +gastric biopsy (gastritis but no metaplasia, dysplasia, or malignancy identified)   History of kidney stones    History of TIA (transient ischemic attack)    secondary to HELLP syndrome 1994 post c/s--  residual memory loss   Hyperlipidemia    statin started after her NSTEMI: goal  LDL <70.   Hypertension    Internal hemorrhoids    Ischemic cardiomyopathy 05/2018   Echo EF 45-50% in context of NSTEMI from CA spasm. 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 04/2017.   Leg pain    ABIs 11/2019: normal    Memory loss    Abnl MRI brain and CT brain c/w chronic microvascular ischemia.  Repeat MRI brain 02/2014 stable (Dr. Mayra Reel  with no plan to f/u with neuro as of 04/2014)   NSTEMI (non-ST elevated myocardial infarction) ([place]) 05/2018   Echo EF 45-50% in context of NSTEMI from CA spasm.// Myoview 8/21: EF 66, no ischemia, low risk   Osteoarthritis of left wrist    STT and 1st [place] jt   Osteoporosis 05/2010   2012 'penia; 06/2015 'porosis:  prolia.  DEXA 09/2017 T-score -2.2 femur neck.  As of 11/2018 we're restarting prolia soon and will check next DEXA 1 yr from her next prolia injection.   Pelvic floor dysfunction    [facility] ([name])   Pneumonia    Postmenopausal vaginal bleeding 05/2017   x 1 small episode; GYN attempted endo bx but unable to penetrate cervix due to severe cerv stenosis  (due to postmenopausal state + hx of LEEP).  Endo u/s showed thin endo lining.  Per GYN---no evidence of endomet pathology on w/u---obs as of 07/2017.   Prediabetes 06/2016   Fasting gluc 105; HbA1c at that time was 6.0%.  A1c 6.2% 12/2017.  A1c 5.7% Feb 2023.   Recurrent kidney stones 08/2013   Left ureteral calculus: Perc nephr--cystoscopy w/ureteroscopy + laser for stone removal.  Residual asymptomatic left renal nephrolithiasis <31m present post-procedure.  Right sided hydronephrosis-persistent (on u/s)--urol ordered CT to further eval 08/2016: 1.6 cm nonobstructing stone lower pole left kidney, no hydro, plan for PCN extraction ([facility])   Shortness of breath    Sprain of neck 12/19/2013   Stroke (HCC)    TIA   TMJ (temporomandibular joint disorder)    USES  MOUTH GUARD   Toe fracture, left 12/2017   2nd toe prox phalanx (immobilization, Dr. RZadie Rhine   Tubular adenoma of colon    Vitamin D deficiency 05/2011   Weak urinary stream 07/2016   with elevated PVR and mild left hydronephrosis secondary to this ([facility] started flomax 0.'4mg'$  qd for this).    Past Surgical History:  Procedure Laterality Date   ABIs  11/29/2019   NORMAL   ANTERIOR CERVICAL DECOMP/DISCECTOMY FUSION  06/30/2009   C3 -- C5  AND EXPLORATION OF FUSION C5-7 W/  PLATE REMOVAL   ARTHRODESIS METATARSALPHALANGEAL JOINT (MTPJ) Left 07/08/2021   Procedure: Left hallux metatarsophalangeal joint arthrodesis;  Surgeon: HWylene Simmer MD;  [place] [facility]  Service: Orthopedics;  Laterality: Left;   BACK SURGERY     BUNIONECTOMY Left    CARDIOVASCULAR STRESS TEST  12/03/2019   myoc perf im: NORMAL   CERVICAL FUSION  2002   anterior C5 -- C7   CERVICAL SPINE SURGERY  08/27/1999     C6 -- T1  LAMINECTOMY/  DISKECTOMY   CESAREAN SECTION  1994   CHOLECYSTECTOMY N/A 03/06/2020   Procedure: LAPAROSCOPIC CHOLECYSTECTOMY;  Surgeon: SFelicie Morn MD;  [place] [facility]  Service: General;   Laterality: N/A;   COLONOSCOPY W/ POLYPECTOMY  04/2008;08/2014   2016 tubular adenoma x 1; +diverticulosis and int/ext hemorrhoids.  06/2020 adenoma, recall 5 yrs.   CYSTOSCOPY WITH URETEROSCOPY AND STENT PLACEMENT Left 08/28/2013   Procedure: CYSTOSCOPY, RGP,  WITH URETEROSCOPY AND STENT REMOVAL;  Surgeon: DBernestine Amass MD;  [facility]  Service: Urology;  Laterality: Left;   DEXA  09/2017   T-score -2.2 femur neck: improved compared to 06/2015.   ESOPHAGOGASTRODUODENOSCOPY  2010; 08/2014   2016 +Candidal esophagitis; Mild chronic gastritis w/intestinal metaplasia--NEG H pylori, neg for eosinophilic esoph. 32/9562esoph candidiasis (diflucan x 20d), o/w normal.   HAMMER  TOE SURGERY Left 07/08/2021   Procedure: Second metatarsal head excision; revision Second hammertoe correction; 2-5 percutaneus flexor tenotomies;  Surgeon: Wylene Simmer, MD;  Location: [place];  Service: Orthopedics;  Laterality: Left;   HARDWARE REMOVAL Left 07/08/2021   Procedure: Removal of deep implants from hallux proximal phalanx and First and Second metatarsals;  Surgeon: Wylene Simmer, MD;  Location: [place];  Service: Orthopedics;  Laterality: Left;   HOLMIUM LASER APPLICATION Left 84/13/2440   Procedure: HOLMIUM LASER APPLICATION;  Surgeon: [name], MD;  Location: [facility];  Service: Urology;  Laterality: Left;   JOINT REPLACEMENT     LEFT HEART CATH AND CORONARY ANGIOGRAPHY N/A 05/15/2018   Evidence for spasm in distal LAD.  Mod RCA atherosclerosis, o/w no CAD.  Procedure: LEFT HEART CATH AND CORONARY ANGIOGRAPHY;  Surgeon: Leonie Man, MD;  Location: [facility];  Service: Cardiovascular;  Laterality: N/A;   NEGATIVE SLEEP STUDY  04/01/2007   NEPHROLITHOTOMY Left 08/05/2013   Procedure: NEPHROLITHOTOMY PERCUTANEOUS;  Surgeon: [name], MD;  Location: [facility];  Service: Urology;  Laterality: Left;   POLYSOMNOGRAM   01/2019   NORMAL   POLYSOMNOGRAPHY  10/25/2018   Dr. Dion Saucier due to pt inadequat sleep time (83 min).   TONSILLECTOMY AND ADENOIDECTOMY  1975   TOTAL KNEE ARTHROPLASTY Right 2003   TRANSTHORACIC ECHOCARDIOGRAM  05/2018; 12/03/2019   (after MI secondary to arterial spasm): EF 45-50%; severe hypokinesis of apical anterolateral, inferolateral , anterior, and inferior LV myocardium. 11/2019 EF 55-60%, valves fine   ULTRASOUND EXAM,PELVIC COMPLETE ([facility] HX)  06/25/2015   NORMAL (done by GYN)    Outpatient Medications Prior to Visit  Medication Sig Dispense Refill   acetaminophen (TYLENOL) 500 MG tablet Take 1,000 mg by mouth every 6 (six) hours as needed for mild pain or headache.      albuterol (VENTOLIN HFA) 108 (90 Base) MCG/ACT inhaler INHALE 2 PUFFS INTO THE LUNGS EVERY 4 HOURS AS NEEDED FOR WHEEZE OR FOR SHORTNESS OF BREATH 8.5 each 2   ALPRAZolam (XANAX) 0.5 MG tablet TAKE 1 TABLET BY MOUTH THREE TIMES A DAY AS NEEDED FOR ANXIETY 90 tablet 5   aspirin EC 81 MG EC tablet Take 1 tablet (81 mg total) by mouth daily. 90 tablet 3   atorvastatin (LIPITOR) 80 MG tablet TAKE 1 TABLET BY MOUTH DAILY AT 6 PM. 90 tablet 3   BESIVANCE 0.6 % SUSP Place 1 drop into the right eye 3 (three) times daily.     citalopram (CELEXA) 20 MG tablet Take 1 tablet (20 mg total) by mouth in the morning and at bedtime. 180 tablet 3   cyclobenzaprine (FLEXERIL) 10 MG tablet TAKE 1 TABLET BY MOUTH TWICE A DAY 60 tablet 1   doxycycline (VIBRAMYCIN) 50 MG capsule Take 50 mg by mouth daily.     famotidine (PEPCID) 40 MG tablet TAKE 1 TABLET BY MOUTH EVERY DAY 90 tablet 3   fluticasone (FLONASE) 50 MCG/ACT nasal spray SPRAY 2 SPRAYS INTO EACH NOSTRIL EVERY DAY 48 mL 1   hydrochlorothiazide (HYDRODIURIL) 12.5 MG tablet TAKE 1 TABLET BY MOUTH EVERY DAY 90 tablet 2   lamoTRIgine (LAMICTAL) 100 MG tablet Take 3 tablets (300 mg total) by mouth at bedtime. 270 tablet 3   losartan (COZAAR) 25 MG tablet TAKE 1  TABLET BY MOUTH EVERY DAY 90 tablet 3   Magnesium 250 MG TABS Take 250 mg by mouth daily.  metoprolol succinate (TOPROL-XL) 100 MG 24 hr tablet Take 1 tablet (100 mg total) by mouth daily. Take with or immediately following a meal. 90 tablet 3   montelukast (SINGULAIR) 10 MG tablet TAKE 1 TABLET BY MOUTH EVERY DAY IN THE EVENING 90 tablet 1   Multiple Vitamin (MULTI-VITAMINS) TABS Take 1 tablet by mouth daily.      pantoprazole (PROTONIX) 40 MG tablet TAKE 1 TABLET BY MOUTH EVERY DAY 90 tablet 0   polyethylene glycol powder (GLYCOLAX/MIRALAX) powder TAKE 17 G BY MOUTH DAILY. 3162 g 0   PROLENSA 0.07 % SOLN Place 1 drop into the right eye at bedtime.     Propylene Glycol 0.6 % SOLN Apply 1 drop to eye 2 (two) times daily.     tamsulosin (FLOMAX) 0.4 MG CAPS capsule Take 0.4 mg by mouth daily.     Tiotropium Bromide-Olodaterol (STIOLTO RESPIMAT) 2.5-2.5 MCG/ACT AERS INHALE 2 PUFFS BY MOUTH INTO THE LUNGS DAILY 4 g 5   triamcinolone cream (KENALOG) 0.1 % Apply 1 application topically daily.     zolpidem (AMBIEN) 10 MG tablet TAKE 1 TABLET BY MOUTH EVERYDAY AT BEDTIME 90 tablet 1   ipratropium-albuterol (DUONEB) 0.5-2.5 (3) MG/3ML SOLN TAKE 3 MLS BY NEBULIZATION 3 (THREE) TIMES DAILY AS NEEDED. (Patient not taking: Reported on 10/07/2021) 360 mL 3   nitroGLYCERIN (NITROSTAT) 0.4 MG SL tablet Place 1 tablet (0.4 mg total) under the tongue every 5 (five) minutes x 3 doses as needed for chest pain. (Patient not taking: Reported on 09/17/2021) 25 tablet 3   prednisoLONE acetate (PRED FORTE) 1 % ophthalmic suspension Place 1 drop into the right eye 4 (four) times daily. (Patient not taking: Reported on 09/17/2021)     No facility-administered medications prior to visit.    Allergies  Allergen Reactions   Ceftin [Cefuroxime Axetil] Anaphylaxis    Swelling of eyes and tongue   Ciprofloxacin Anaphylaxis    Difficulty breathing and generalized swelling   Fosamax [Alendronate Sodium] Diarrhea   Morphine  And Related Other (See Comments)    Hallucinations/ disoriented - pt stated, "Only Morphine" - 03/23/17   Oxycodone Other (See Comments)    Patient was "hooked" on medication.   Prednisone Other (See Comments)    Hyperactivity   Seroquel [Quetiapine] Other (See Comments)    oversedation    ROS As per HPI  PE:    10/15/2021    1:17 PM 09/30/2021    5:30 PM 09/30/2021    5:00 PM  Vitals with BMI  Height 5' 5.5"    Weight 185 lbs 10 oz    BMI 12.8    Systolic 786 767 209  Diastolic 72 470 96  Pulse 69 82 89   Physical Exam  Gen: Alert, well appearing.  Patient is oriented to person, place, time, and situation. AFFECT: pleasant, lucid thought and speech. No tenderness to palpation over the neck, trapezius regions or scapular regions.  She does have some tenderness around the left acromion region.  Neck stiffness with lateral bending to the left and rotation to the left.  Positive impingement signs left shoulder.  No biceps groove tenderness.  Minimal AC joint tenderness  LABS:  Last CBC Lab Results  Component Value Date   WBC 5.9 10/29/2020   HGB 12.4 10/29/2020   HCT 35.9 (L) 10/29/2020   MCV 91.5 10/29/2020   MCH 30.3 03/02/2020   RDW 13.1 10/29/2020   PLT 264.0 96/28/3662   Last metabolic panel Lab Results  Component Value  Date   GLUCOSE 101 (H) 07/01/2021   NA 136 07/01/2021   K 3.8 07/01/2021   CL 102 07/01/2021   CO2 27 07/01/2021   BUN 11 07/01/2021   CREATININE 0.95 07/01/2021   GFRNONAA >60 07/01/2021   CALCIUM 9.5 07/01/2021   PROT 6.6 05/06/2021   ALBUMIN 4.6 05/06/2021   BILITOT 0.8 05/06/2021   ALKPHOS 55 05/06/2021   AST 23 05/06/2021   ALT 22 05/06/2021   ANIONGAP 7 07/01/2021   IMPRESSION AND PLAN:  #1 left sided muscular strain of the neck and trapezius and supraspinatus regions.  No trigger points. PT referral Meloxicam '15mg'$  qd x 2 wks. Oxycodone '5mg'$ , 1 4 times daily as needed severe pain #30. (Bedside ultrasound today: Biceps tendon  normal, subscap normal, supraspinatus and infraspinatus normal, no bursal distention.  AC joint mild arthritic changes.  Posterior labrum and glenohumeral joint appear normal.)   #2 left forearm skin tear.   Healed . Td booster today.  An After Visit Summary was printed and given to the patient.  FOLLOW UP: No follow-ups on file.  Signed:  Crissie Sickles, MD           10/15/2021

## 2021-10-19 ENCOUNTER — Other Ambulatory Visit: Payer: Self-pay | Admitting: Family Medicine

## 2021-10-19 ENCOUNTER — Encounter: Payer: Self-pay | Admitting: Physical Therapy

## 2021-10-20 ENCOUNTER — Telehealth: Payer: Self-pay | Admitting: Cardiology

## 2021-10-20 NOTE — Telephone Encounter (Signed)
Spoke with the patient who states that her BP has been elevated more frequently recently. She states that she can tell when her blood pressure is up because she feels bad and her temples hurt. She states that she fell and injured her neck several weeks ago and her BP has been more elevated since then. She has been in a good bit of pain and has followed up with her PCP who prescribed oxycodone and meloxicam.  Patient confirms that she is taking metoporlol 100 mg and HCTZ 12.'5mg'$  every morning and losartan 25 mg every evening. She states typically her blood pressure would not run higher than 130s/80s but has recently been 150s/90s. Patient concerned because she overall has not been feeling well. She did have some nausea earlier today and BP has remained elevated. Denies chest pain, dizziness or shortness of breath. Advised to follow up with PCP in regards to continued neck pain and that I would send message to Dr. Radford Pax in regards to her BP.

## 2021-10-20 NOTE — Telephone Encounter (Signed)
Pt c/o BP issue: STAT if pt c/o blurred vision, one-sided weakness or slurred speech  1. What are your last 5 BP readings?  158/100 154/90 156/89 144/88 156/91  2. Are you having any other symptoms (ex. Dizziness, headache, blurred vision, passed out)? Headaches and pain in neck. States head is throbbing.  3. What is your BP issue? Pt is requesting call back to see if she needs a higher strength of bp meds. She says she fell on 6/29 and fell on one side of her body and was seen in ED and her neck has been hurting since. She believes that the bp elevation is caused due to pain.

## 2021-10-21 ENCOUNTER — Encounter: Payer: Self-pay | Admitting: Family Medicine

## 2021-10-21 NOTE — Telephone Encounter (Signed)
   Pt c/o BP issue: STAT if pt c/o blurred vision, one-sided weakness or slurred speech  1. What are your last 5 BP readings?  Given yesterday  2. Are you having any other symptoms (ex. Dizziness, headache, blurred vision, passed out)?   Yes - dizziness, headache, blurry eyes  3. What is your BP issue?    Pt c/o Shortness Of Breath: STAT if SOB developed within the last 24 hours or pt is noticeably SOB on the phone  1. Are you currently SOB (can you hear that pt is SOB on the phone)?  No  2. How long have you been experiencing SOB?   Several years  3. Are you SOB when sitting or when up moving around?   When moving around  4. Are you currently experiencing any other symptoms?    Patient called stating she is still concerned about her blood pressure. She stated she also has SOB.

## 2021-10-21 NOTE — Telephone Encounter (Signed)
Called pt back in regards to blood pressure.  Pt reports elevated BP started post fall.  Pt reports is taking percocet 5/325 for pain.  Reports pain is not controlled.  Advised pt to reach out to prescribing provider to inform that pain is not controlled and as a result BP is elevated.  Pt expresses will call but would like Dr. Landis Gandy input.  Will route to Dr. Radford Pax to address.

## 2021-10-21 NOTE — Telephone Encounter (Signed)
Please review and advise.

## 2021-10-22 NOTE — Telephone Encounter (Signed)
Okay to take 2 of the 5 mg oxycodone tabs BUT I want her to reserve this for a bedtime dose.  During the day take no more than 1 oxycodone per dose.  May add a 500 mg Tylenol every 4 hours. Make follow-up appointment with me next week if not significantly improving over the weekend.

## 2021-10-22 NOTE — Telephone Encounter (Signed)
Spoke with the patient and gave advise per Dr. Radford Pax.

## 2021-10-24 ENCOUNTER — Encounter: Payer: Self-pay | Admitting: Family Medicine

## 2021-10-25 ENCOUNTER — Encounter: Payer: Self-pay | Admitting: Family Medicine

## 2021-10-25 ENCOUNTER — Ambulatory Visit (INDEPENDENT_AMBULATORY_CARE_PROVIDER_SITE_OTHER): Payer: Medicare Other | Admitting: Family Medicine

## 2021-10-25 ENCOUNTER — Other Ambulatory Visit: Payer: Self-pay | Admitting: Family Medicine

## 2021-10-25 VITALS — BP 123/75 | HR 67 | Temp 98.5°F | Wt 183.2 lb

## 2021-10-25 DIAGNOSIS — S161XXD Strain of muscle, fascia and tendon at neck level, subsequent encounter: Secondary | ICD-10-CM

## 2021-10-25 DIAGNOSIS — F411 Generalized anxiety disorder: Secondary | ICD-10-CM | POA: Diagnosis not present

## 2021-10-25 DIAGNOSIS — F3161 Bipolar disorder, current episode mixed, mild: Secondary | ICD-10-CM

## 2021-10-25 MED ORDER — RISPERIDONE 0.5 MG PO TABS: ORAL_TABLET | ORAL | 0 refills | Status: DC

## 2021-10-25 NOTE — Progress Notes (Signed)
OFFICE VISIT  10/25/2021  CC:  Chief Complaint  Patient presents with   Shoulder Pain   Patient is a 71 y.o. female who presents for ongoing pain in left shoulder region. I last saw her 10 days ago. A/P as of that visit: "#1 left sided muscular strain of the neck and trapezius and supraspinatus regions.  No trigger points. PT referral Meloxicam '15mg'$  qd x 2 wks. Oxycodone '5mg'$ , 1 4 times daily as needed severe pain #30. (Bedside ultrasound today: Biceps tendon normal, subscap normal, supraspinatus and infraspinatus normal, no bursal distention.  AC joint mild arthritic changes.  Posterior labrum and glenohumeral joint appear normal.)    #2 left forearm skin tear.   Healed . Td booster today."  INTERIM HX: Erin Lopez is struggling, primarily with pain in the back of her neck, very stiff.  Having some occipital headaches.  The pain sometimes does radiate across the top of the left shoulder but only occasionally.   She feels like her mind is racing, cannot think very clearly a lot of the time, feels depressed at 1 minute and then find another minute. Inner restlessness. Feels overwhelmed, more anxious than even her normal.  Does not feel out of control.  No SI or HI. Mind will not shut down when trying to sleep.  She tends to worry an extreme amount about her pain, high blood pressure, and cognitive troubles. She has been reading a lot on [employer] about things and this continues to spur anxiety for her.  ROS as above, plus--> no fevers, no CP, no SOB, no wheezing, no cough, no dizziness,  no rashes, no melena/hematochezia.  No polyuria or polydipsia.  No myalgias or arthralgias.  No focal weakness, paresthesias, or tremors.  No acute vision or hearing abnormalities.  No dysuria or unusual/new urinary urgency or frequency.  No recent changes in lower legs. No n/v/d or abd pain.  No palpitations.    Past Medical History:  Diagnosis Date   Alcoholism in remission [facility])    states last alcohol  11 yrs ago   Anemia    Anxiety    Bipolar 1 disorder ([place])    Admission for mania x 2 (most recent 11/2017)   Cervical spondylosis without myelopathy 12/19/2013   MRI 02/2014: multilevel DDD/spondylosis, not much change compared to prior MRI.  Pt not in favor of invasive therapy for her neck as of 04/2014.   Cholelithiasis    COPD (chronic obstructive pulmonary disease) (HCC)    Moderate: anoro started 04/2017 by pulm, pt symptomatically improved and PFTs stable at f/u 06/2017.   Coronary artery spasm ([place]) 07/11/2018   nitrates=HA. Amlodipine=intol swelling.  Changed to coreg 09/03/18 by cardiologist.   Depression    Dilutional hyponatremia    Endometritis 05/2017   possible; empiric tx with Flagyl by GYN.   Environmental allergies    Fibromyalgia    GERD (gastroesophageal reflux disease)    Hepatic steatosis 03/2019   u/s abd   Herpes zoster 08/07/2017   L scapular region   Hiatal hernia    History of adenomatous polyp of colon 04/2008; 08/2014   No high grade dysplasia: recall 5 yrs ([name], [name])   History of basal cell carcinoma excision    NOSE   History of Helicobacter pylori infection 04/2008   +gastric biopsy (gastritis but no metaplasia, dysplasia, or malignancy identified)   History of kidney stones    History of TIA (transient ischemic attack)    secondary to HELLP syndrome  1994 post c/s--  residual memory loss   Hyperlipidemia    statin started after her NSTEMI: goal  LDL <70.   Hypertension    Internal hemorrhoids    Ischemic cardiomyopathy 05/2018   Echo EF 45-50% in context of NSTEMI from CA spasm. 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 04/2017.   Leg pain    ABIs 11/2019: normal    Memory loss    Abnl MRI brain  and CT brain c/w chronic microvascular ischemia.  Repeat MRI brain 02/2014 stable (Dr. Mayra Reel with no plan to f/u with neuro as of 04/2014)   NSTEMI (non-ST elevated myocardial infarction) ([place]) 05/2018   Echo EF 45-50% in context of NSTEMI from CA spasm.// Myoview 8/21: EF 66, no ischemia, low risk   Osteoarthritis of left wrist    STT and 1st [place] jt   Osteoporosis 05/2010   2012 'penia; 06/2015 'porosis:  prolia.  DEXA 09/2017 T-score -2.2 femur neck.  As of 11/2018 we're restarting prolia soon and will check next DEXA 1 yr from her next prolia injection.   Pelvic floor dysfunction    [facility] ([name])   Pneumonia    Postmenopausal vaginal bleeding 05/2017   x 1 small episode; GYN attempted endo bx but unable to penetrate cervix due to severe cerv stenosis (due to postmenopausal state + hx of LEEP).  Endo u/s showed thin endo lining.  Per GYN---no evidence of endomet pathology on w/u---obs as of 07/2017.   Prediabetes 06/2016   Fasting gluc 105; HbA1c at that time was 6.0%.  A1c 6.2% 12/2017.  A1c 5.7% Feb 2023.   Recurrent kidney stones 08/2013   Left ureteral calculus: Perc nephr--cystoscopy w/ureteroscopy + laser for stone removal.  Residual asymptomatic left renal nephrolithiasis <30m present post-procedure.  Right sided hydronephrosis-persistent (on u/s)--urol ordered CT to further eval 08/2016: 1.6 cm nonobstructing stone lower pole left kidney, no hydro, plan for PCN extraction ([facility])   Shortness of breath    Sprain of neck 12/19/2013   Stroke (HCC)    TIA   TMJ (temporomandibular joint disorder)    USES  MOUTH GUARD   Toe fracture, left 12/2017   2nd toe prox phalanx (immobilization, Dr. RZadie Rhine   Tubular adenoma of colon    Vitamin D deficiency 05/2011   Weak urinary stream 07/2016   with elevated PVR and mild left hydronephrosis secondary to this ([facility] started flomax 0.'4mg'$  qd for this).    Past Surgical History:  Procedure Laterality Date   ABIs   11/29/2019   NORMAL   ANTERIOR CERVICAL DECOMP/DISCECTOMY FUSION  06/30/2009   C3 -- C5  AND EXPLORATION OF FUSION C5-7 W/  PLATE REMOVAL   ARTHRODESIS METATARSALPHALANGEAL JOINT (MTPJ) Left 07/08/2021   Procedure: Left hallux metatarsophalangeal joint arthrodesis;  Surgeon: HWylene Simmer MD;  Location[place] [facility]  Service: Orthopedics;  Laterality: Left;   BACK SURGERY     BUNIONECTOMY Left    CARDIOVASCULAR STRESS TEST  12/03/2019   myoc perf im: NORMAL   CERVICAL FUSION  2002   anterior C5 -- C7   CERVICAL SPINE SURGERY  08/27/1999     C6 -- T1  LAMINECTOMY/  DISKECTOMY   CESAREAN SECTION  1994   CHOLECYSTECTOMY N/A 03/06/2020  Procedure: LAPAROSCOPIC CHOLECYSTECTOMY;  Surgeon: Felicie Morn, MD;  Location: [place];  Service: General;  Laterality: N/A;   COLONOSCOPY W/ POLYPECTOMY  04/2008;08/2014   2016 tubular adenoma x 1; +diverticulosis and int/ext hemorrhoids.  06/2020 adenoma, recall 5 yrs.   CYSTOSCOPY WITH URETEROSCOPY AND STENT PLACEMENT Left 08/28/2013   Procedure: CYSTOSCOPY, RGP,  WITH URETEROSCOPY AND STENT REMOVAL;  Surgeon: [name], MD;  [facility];  Service: Urology;  Laterality: Left;   DEXA  09/2017   T-score -2.2 femur neck: improved compared to 06/2015.   ESOPHAGOGASTRODUODENOSCOPY  2010; 08/2014   2016 +Candidal esophagitis; Mild chronic gastritis w/intestinal metaplasia--NEG H pylori, neg for eosinophilic esoph. 12/8919 esoph candidiasis (diflucan x 20d), o/w normal.   HAMMER TOE SURGERY Left 07/08/2021   Procedure: Second metatarsal head excision; revision Second hammertoe correction; 2-5 percutaneus flexor tenotomies;  Surgeon: Wylene Simmer, MD;  Location: [place];  Service: Orthopedics;  Laterality: Left;   HARDWARE REMOVAL Left 07/08/2021   Procedure: Removal of deep implants from hallux proximal phalanx and First and Second metatarsals;  Surgeon: Wylene Simmer, MD;  Location: [place];  Service: Orthopedics;  Laterality: Left;   HOLMIUM LASER APPLICATION Left 19/41/7408   Procedure: HOLMIUM LASER APPLICATION;  Surgeon: [name], MD;  Location: [facility];  Service: Urology;  Laterality: Left;   JOINT REPLACEMENT     LEFT HEART CATH AND CORONARY ANGIOGRAPHY N/A 05/15/2018   Evidence for spasm in distal LAD.  Mod RCA atherosclerosis, o/w no CAD.  Procedure: LEFT HEART CATH AND CORONARY ANGIOGRAPHY;  Surgeon: Leonie Man, MD;  Location: [facility];  Service: Cardiovascular;  Laterality: N/A;   NEGATIVE SLEEP STUDY  04/01/2007   NEPHROLITHOTOMY Left 08/05/2013   Procedure: NEPHROLITHOTOMY PERCUTANEOUS;  Surgeon: [name], MD;  Location: [facility];  Service: Urology;  Laterality: Left;   POLYSOMNOGRAM  01/2019   NORMAL   POLYSOMNOGRAPHY  10/25/2018   Dr. Dion Saucier due to pt inadequat sleep time (83 min).   TONSILLECTOMY AND ADENOIDECTOMY  1975   TOTAL KNEE ARTHROPLASTY Right 2003   TRANSTHORACIC ECHOCARDIOGRAM  05/2018; 12/03/2019   (after MI secondary to arterial spasm): EF 45-50%; severe hypokinesis of apical anterolateral, inferolateral , anterior, and inferior LV myocardium. 11/2019 EF 55-60%, valves fine   ULTRASOUND EXAM,PELVIC COMPLETE ([facility] HX)  06/25/2015   NORMAL (done by GYN)    Outpatient Medications Prior to Visit  Medication Sig Dispense Refill   acetaminophen (TYLENOL) 500 MG tablet Take 1,000 mg by mouth every 6 (six) hours as needed for mild pain or headache.      albuterol (VENTOLIN HFA) 108 (90 Base) MCG/ACT inhaler INHALE 2 PUFFS INTO THE LUNGS EVERY 4 HOURS AS NEEDED FOR WHEEZE OR FOR SHORTNESS OF BREATH 8.5 each 2   ALPRAZolam (XANAX) 0.5 MG tablet TAKE 1 TABLET BY MOUTH THREE TIMES A DAY AS NEEDED FOR ANXIETY 90 tablet 5   aspirin EC 81 MG EC tablet Take 1 tablet (81 mg total) by mouth daily. 90 tablet 3   atorvastatin (LIPITOR) 80 MG tablet TAKE 1 TABLET BY MOUTH DAILY AT 6 PM. 90 tablet 3    BESIVANCE 0.6 % SUSP Place 1 drop into the right eye 3 (three) times daily.     citalopram (CELEXA) 20 MG tablet Take 1 tablet (20 mg total) by mouth in the morning and at bedtime. 180 tablet 3   cyclobenzaprine (FLEXERIL) 10 MG tablet TAKE  1 TABLET BY MOUTH TWICE A DAY 60 tablet 1   doxycycline (VIBRAMYCIN) 50 MG capsule Take 50 mg by mouth daily.     famotidine (PEPCID) 40 MG tablet TAKE 1 TABLET BY MOUTH EVERY DAY 90 tablet 3   fluticasone (FLONASE) 50 MCG/ACT nasal spray SPRAY 2 SPRAYS INTO EACH NOSTRIL EVERY DAY 48 mL 1   hydrochlorothiazide (HYDRODIURIL) 12.5 MG tablet TAKE 1 TABLET BY MOUTH EVERY DAY 90 tablet 2   ipratropium-albuterol (DUONEB) 0.5-2.5 (3) MG/3ML SOLN TAKE 3 MLS BY NEBULIZATION 3 (THREE) TIMES DAILY AS NEEDED. 360 mL 3   lamoTRIgine (LAMICTAL) 100 MG tablet Take 3 tablets (300 mg total) by mouth at bedtime. 270 tablet 3   losartan (COZAAR) 25 MG tablet TAKE 1 TABLET BY MOUTH EVERY DAY 90 tablet 3   Magnesium 250 MG TABS Take 250 mg by mouth daily.      meloxicam (MOBIC) 15 MG tablet Take 1 tablet (15 mg total) by mouth daily. 15 tablet 0   metoprolol succinate (TOPROL-XL) 100 MG 24 hr tablet Take 1 tablet (100 mg total) by mouth daily. Take with or immediately following a meal. 90 tablet 3   montelukast (SINGULAIR) 10 MG tablet TAKE 1 TABLET BY MOUTH EVERY DAY IN THE EVENING 90 tablet 1   Multiple Vitamin (MULTI-VITAMINS) TABS Take 1 tablet by mouth daily.      nitroGLYCERIN (NITROSTAT) 0.4 MG SL tablet Place 1 tablet (0.4 mg total) under the tongue every 5 (five) minutes x 3 doses as needed for chest pain. 25 tablet 3   oxyCODONE (OXY IR/ROXICODONE) 5 MG immediate release tablet 1 tab po q6h prn pain 30 tablet 0   pantoprazole (PROTONIX) 40 MG tablet TAKE 1 TABLET BY MOUTH EVERY DAY 90 tablet 0   polyethylene glycol powder (GLYCOLAX/MIRALAX) powder TAKE 17 G BY MOUTH DAILY. 3162 g 0   prednisoLONE acetate (PRED FORTE) 1 % ophthalmic suspension Place 1 drop into the  right eye 4 (four) times daily.     PROLENSA 0.07 % SOLN Place 1 drop into the right eye at bedtime.     Propylene Glycol 0.6 % SOLN Apply 1 drop to eye 2 (two) times daily.     tamsulosin (FLOMAX) 0.4 MG CAPS capsule Take 0.4 mg by mouth daily.     Tiotropium Bromide-Olodaterol (STIOLTO RESPIMAT) 2.5-2.5 MCG/ACT AERS INHALE 2 PUFFS BY MOUTH INTO THE LUNGS DAILY 4 g 5   triamcinolone cream (KENALOG) 0.1 % Apply 1 application topically daily.     zolpidem (AMBIEN) 10 MG tablet TAKE 1 TABLET BY MOUTH EVERYDAY AT BEDTIME 90 tablet 1   No facility-administered medications prior to visit.    Allergies  Allergen Reactions   Ceftin [Cefuroxime Axetil] Anaphylaxis    Swelling of eyes and tongue   Ciprofloxacin Anaphylaxis    Difficulty breathing and generalized swelling   Fosamax [Alendronate Sodium] Diarrhea   Morphine And Related Other (See Comments)    Hallucinations/ disoriented - pt stated, "Only Morphine" - 03/23/17   Oxycodone Other (See Comments)    Patient was "hooked" on medication.   Prednisone Other (See Comments)    Hyperactivity   Seroquel [Quetiapine] Other (See Comments)    oversedation    ROS As per HPI  PE:    10/25/2021    1:08 PM 10/25/2021    1:05 PM 10/15/2021    1:17 PM  Vitals with BMI  Height   5' 5.5"  Weight  183 lbs 3 oz 185 lbs 10 oz  BMI  39.03 00.9  Systolic 233 007 622  Diastolic 75 85 72  Lopez  67 69     Physical Exam  Gen: Alert, well appearing.  Patient is oriented to person, place, time, and situation. AFFECT: pleasant, lucid thought and speech. Gets tearful at times. Neck: Flexion to just 20 degrees due to stiffness and pain.  Extension about 45 degrees.  Lateral bending about 20 degrees bilateral, rotation about 45 degrees. No significant tenderness to palpation anywhere in the midline or paraspinous soft tissues in the cervical spine or over the tops of the traps bilaterally.  She has mild amount of tenderness to palpation focally over  the [facility] joint on the left.  Scarf sign positive.  LABS:  Last CBC Lab Results  Component Value Date   WBC 5.9 10/29/2020   HGB 12.4 10/29/2020   HCT 35.9 (L) 10/29/2020   MCV 91.5 10/29/2020   MCH 30.3 03/02/2020   RDW 13.1 10/29/2020   PLT 264.0 63/33/5456   Last metabolic panel Lab Results  Component Value Date   GLUCOSE 101 (H) 07/01/2021   NA 136 07/01/2021   K 3.8 07/01/2021   CL 102 07/01/2021   CO2 27 07/01/2021   BUN 11 07/01/2021   CREATININE 0.95 07/01/2021   GFRNONAA >60 07/01/2021   CALCIUM 9.5 07/01/2021   PROT 6.6 05/06/2021   ALBUMIN 4.6 05/06/2021   BILITOT 0.8 05/06/2021   ALKPHOS 55 05/06/2021   AST 23 05/06/2021   ALT 22 05/06/2021   ANIONGAP 7 07/01/2021   IMPRESSION AND PLAN:  #1 neck strain. Recommended physical therapy--referral today.. Continue meloxicam. She has some oxycodone and we discussed the potential dangers of using this medication for pain.  She expressed understanding. A new prescription for this medication was not needed today.  #2 bipolar disorder mixed phase with severe generalized anxiety. Psych referral to establish counseling. Add Risperdal 0.5 mg nightly x5 days, then increase to 2 nightly.  Continue Lamictal 300 mg a day and citalopram 20 mg a day. Referred to PT. Continue all current meds.  An After Visit Summary was printed and given to the patient.  FOLLOW UP: Return in about 1 week (around 11/01/2021) for f/u mood.  Signed:  Crissie Sickles, MD           10/25/2021

## 2021-10-25 NOTE — Telephone Encounter (Signed)
Pt scheduled for appt today.

## 2021-10-25 NOTE — Patient Instructions (Signed)
Take the new medication called risperdal, 1 tab at night for the next 5 nights, then take 2 tabs every night.  Continue all your current medications as well.

## 2021-10-26 ENCOUNTER — Ambulatory Visit: Payer: Medicare Other | Admitting: Physical Therapy

## 2021-10-26 ENCOUNTER — Encounter: Payer: Self-pay | Admitting: Physical Therapy

## 2021-10-26 DIAGNOSIS — M542 Cervicalgia: Secondary | ICD-10-CM

## 2021-10-26 DIAGNOSIS — R262 Difficulty in walking, not elsewhere classified: Secondary | ICD-10-CM | POA: Diagnosis not present

## 2021-10-26 DIAGNOSIS — M79675 Pain in left toe(s): Secondary | ICD-10-CM

## 2021-10-26 DIAGNOSIS — M25675 Stiffness of left foot, not elsewhere classified: Secondary | ICD-10-CM

## 2021-10-26 NOTE — Therapy (Signed)
OUTPATIENT PHYSICAL THERAPY LOWER EXTREMITY TREATMENT/Re-Eval   Patient Name: Erin Lopez MRN: [id number] DOB:Jun 14, 1950, 71 y.o., female Today's Date: 10/26/2021   PT End of Session - 10/26/21 2123     Visit Number 4    Number of Visits 13    Date for PT Re-Evaluation 12/22/21    Authorization Type U[employer] MCR    PT Start Time 1022    PT Stop Time 1100    PT Time Calculation (min) 38 min    Activity Tolerance Patient tolerated treatment well    Behavior During Therapy WFL for tasks assessed/performed              Past Medical History:  Diagnosis Date   Alcoholism in remission ([place])    states last alcohol 11 yrs ago   Anemia    Anxiety    Bipolar 1 disorder ([place])    Admission for mania x 2 (most recent 11/2017)   Cervical spondylosis without myelopathy 12/19/2013   MRI 02/2014: multilevel DDD/spondylosis, not much change compared to prior MRI.  Pt not in favor of invasive therapy for her neck as of 04/2014.   Cholelithiasis    COPD (chronic obstructive pulmonary disease) (HCC)    Moderate: anoro started 04/2017 by pulm, pt symptomatically improved and PFTs stable at f/u 06/2017.   Coronary artery spasm ([place]) 07/11/2018   nitrates=HA. Amlodipine=intol swelling.  Changed to coreg 09/03/18 by cardiologist.   Depression    Dilutional hyponatremia    Endometritis 05/2017   possible; empiric tx with Flagyl by GYN.   Environmental allergies    Fibromyalgia    GERD (gastroesophageal reflux disease)    Hepatic steatosis 03/2019   u/s abd   Herpes zoster 08/07/2017   L scapular region   Hiatal hernia    History of adenomatous polyp of colon 04/2008; 08/2014   No high grade dysplasia: recall 5 yrs ([name], [name])   History of basal cell carcinoma excision    NOSE   History of Helicobacter pylori infection 04/2008   +gastric biopsy (gastritis but no metaplasia, dysplasia, or malignancy identified)   History of kidney stones    History of TIA (transient ischemic  attack)    secondary to HELLP syndrome 1994 post c/s--  residual memory loss   Hyperlipidemia    statin started after her NSTEMI: goal  LDL <70.   Hypertension    Internal hemorrhoids    Ischemic cardiomyopathy 05/2018   Echo EF 45-50% in context of NSTEMI from CA spasm. 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 04/2017.   Leg pain    ABIs 11/2019: normal    Memory loss    Abnl MRI brain and CT brain c/w chronic microvascular ischemia.  Repeat MRI brain 02/2014 stable (Dr. Mayra Reel with no plan to f/u with neuro as of 04/2014)   NSTEMI (non-ST elevated myocardial infarction) ([place]) 05/2018   Echo EF 45-50% in context of NSTEMI from CA spasm.// Myoview 8/21: EF 66, no ischemia, low risk   Osteoarthritis of left wrist    STT and 1st [place] jt   Osteoporosis 05/2010   2012 'penia; 06/2015 'porosis:  prolia.  DEXA 09/2017 T-score -2.2 femur neck.  As of 11/2018 we're restarting prolia soon and will check next DEXA 1  yr from her next prolia injection.   Pelvic floor dysfunction    [facility] ([name])   Pneumonia    Postmenopausal vaginal bleeding 05/2017   x 1 small episode; GYN attempted endo bx but unable to penetrate cervix due to severe cerv stenosis (due to postmenopausal state + hx of LEEP).  Endo u/s showed thin endo lining.  Per GYN---no evidence of endomet pathology on w/u---obs as of 07/2017.   Prediabetes 06/2016   Fasting gluc 105; HbA1c at that time was 6.0%.  A1c 6.2% 12/2017.  A1c 5.7% Feb 2023.   Recurrent kidney stones 08/2013   Left ureteral calculus: Perc nephr--cystoscopy w/ureteroscopy + laser for stone removal.  Residual asymptomatic left renal nephrolithiasis <5m present post-procedure.  Right sided hydronephrosis-persistent  (on u/s)--urol ordered CT to further eval 08/2016: 1.6 cm nonobstructing stone lower pole left kidney, no hydro, plan for PCN extraction ([facility])   Shortness of breath    Sprain of neck 12/19/2013   Stroke (HCC)    TIA   TMJ (temporomandibular joint disorder)    USES  MOUTH GUARD   Toe fracture, left 12/2017   2nd toe prox phalanx (immobilization, Dr. RZadie Rhine   Tubular adenoma of colon    Vitamin D deficiency 05/2011   Weak urinary stream 07/2016   with elevated PVR and mild left hydronephrosis secondary to this ([facility] started flomax 0.'4mg'$  qd for this).   Past Surgical History:  Procedure Laterality Date   ABIs  11/29/2019   NORMAL   ANTERIOR CERVICAL DECOMP/DISCECTOMY FUSION  06/30/2009   C3 -- C5  AND EXPLORATION OF FUSION C5-7 W/  PLATE REMOVAL   ARTHRODESIS METATARSALPHALANGEAL JOINT (MTPJ) Left 07/08/2021   Procedure: Left hallux metatarsophalangeal joint arthrodesis;  Surgeon: HWylene Simmer MD;  Location: [facility]  Service: Orthopedics;  Laterality: Left;   BACK SURGERY     BUNIONECTOMY Left    CARDIOVASCULAR STRESS TEST  12/03/2019   myoc perf im: NORMAL   CERVICAL FUSION  2002   anterior C5 -- C7   CERVICAL SPINE SURGERY  08/27/1999     C6 -- T1  LAMINECTOMY/  DISKECTOMY   CESAREAN SECTION  1994   CHOLECYSTECTOMY N/A 03/06/2020   Procedure: LAPAROSCOPIC CHOLECYSTECTOMY;  Surgeon: SFelicie Morn MD;  [facility]  Service: General;  Laterality: N/A;   COLONOSCOPY W/ POLYPECTOMY  04/2008;08/2014   2016 tubular adenoma x 1; +diverticulosis and int/ext hemorrhoids.  06/2020 adenoma, recall 5 yrs.   CYSTOSCOPY WITH URETEROSCOPY AND STENT PLACEMENT Left 08/28/2013   Procedure: CYSTOSCOPY, RGP,  WITH URETEROSCOPY AND STENT REMOVAL;  Surgeon: DBernestine Amass MD;  [facility]  Service: Urology;  Laterality: Left;   DEXA  09/2017   T-score -2.2 femur neck: improved compared to 06/2015.   ESOPHAGOGASTRODUODENOSCOPY  2010;  08/2014   2016 +Candidal esophagitis; Mild chronic gastritis w/intestinal metaplasia--NEG H pylori, neg for eosinophilic esoph. 38/1191esoph candidiasis (diflucan x 20d), o/w normal.   HAMMER TOE SURGERY Left 07/08/2021   Procedure: Second metatarsal head excision; revision Second hammertoe correction; 2-5 percutaneus flexor tenotomies;  Surgeon: HWylene Simmer MD;  [facility]  Service: Orthopedics;  Laterality: Left;   HARDWARE REMOVAL Left 07/08/2021   Procedure: Removal of deep implants from hallux proximal phalanx and First and Second metatarsals;  Surgeon: HWylene Simmer MD;  [facility]  Service: Orthopedics;  Laterality: Left;   HOLMIUM LASER APPLICATION Left [id number]  Procedure: HOLMIUM LASER  APPLICATION;  Surgeon: [name], MD;  Location[facility];  Service: Urology;  Laterality: Left;   JOINT REPLACEMENT     LEFT HEART CATH AND CORONARY ANGIOGRAPHY N/A 05/15/2018   Evidence for spasm in distal LAD.  Mod RCA atherosclerosis, o/w no CAD.  Procedure: LEFT HEART CATH AND CORONARY ANGIOGRAPHY;  Surgeon: Leonie Man, MD;  Location: [facility];  Service: Cardiovascular;  Laterality: N/A;   NEGATIVE SLEEP STUDY  04/01/2007   NEPHROLITHOTOMY Left 08/05/2013   Procedure: NEPHROLITHOTOMY PERCUTANEOUS;  Surgeon: [name], MD;  Location: [facility];  Service: Urology;  Laterality: Left;   POLYSOMNOGRAM  01/2019   NORMAL   POLYSOMNOGRAPHY  10/25/2018   Dr. Dion Saucier due to pt inadequat sleep time (83 min).   TONSILLECTOMY AND ADENOIDECTOMY  1975   TOTAL KNEE ARTHROPLASTY Right 2003   TRANSTHORACIC ECHOCARDIOGRAM  05/2018; 12/03/2019   (after MI secondary to arterial spasm): EF 45-50%; severe hypokinesis of apical anterolateral, inferolateral , anterior, and inferior LV myocardium. 11/2019 EF 55-60%, valves fine   ULTRASOUND EXAM,PELVIC COMPLETE ([place] [facility])  06/25/2015   NORMAL (done by GYN)    Patient Active Problem List   Diagnosis Date Noted   Shortness of breath 04/19/2019   Healthcare maintenance 04/19/2019   Overweight (BMI 25.0-29.9) 02/19/2019   Ischemic cardiomyopathy 09/03/2018   Chest pain 07/11/2018   Coronary vasospasm ([place]) 07/11/2018   NSTEMI (non-ST elevated myocardial infarction) ([place]) 05/14/2018   Acute encephalopathy 11/19/2017   Hyponatremia 11/16/2017   Bipolar 1 disorder ([place]) 12/29/2016   Hypertension 12/29/2016   TIA (transient ischemic attack) 12/29/2016   Chronic pelvic pain in female 08/22/2016   Stage 2 chronic kidney disease 08/22/2016   Osteoporosis 09/30/2015   Health maintenance examination 06/11/2015   Suprapubic discomfort 06/05/2015   Urethral caruncle 06/05/2015   Infection of urinary tract 06/05/2015   TMJ (temporomandibular joint disorder) 02/25/2015   Right ankle injury 11/05/2014   Cervical spondylosis without myelopathy 12/19/2013   Sprain of neck 12/19/2013   Memory difficulties 12/19/2013   Postnasal drip 10/28/2013   Renal calculus 08/05/2013   Urinary frequency 08/01/2013   Constipation, chronic 07/29/2013   COPD (chronic obstructive pulmonary disease) ([place]) 06/30/2013   Abnormal chest x-ray 06/30/2013   Benign essential HTN 05/29/2013   Kidney stone on left side 05/29/2013   Hyperlipidemia 05/29/2013    PCP: Tammi Sou, MD  REFERRING PROVIDER:  foot: Corky Sing, PA-C     Neck: Phlip McGowen  REFERRING DIAG: M20.22 (ICD-10-CM) - Hallux rigidus, left foot  Procedure(s): 1.  Left first metatarsal removal of deep implant 2.  Left hallux proximal phalanx removal of deep implant 3.  Left hallux metatarsophalangeal joint arthrodesis 4.  Removal of deep implant from the left second metatarsal 5.  Left second metatarsal head excision 6.  Left second hammertoe correction 7.  Left third, fourth and fifth toe flexor digitorum longus tenotomies 8.  AP, lateral and oblique radiographs of the left  foot  THERAPY DIAG:  Stiffness of left foot, not elsewhere classified  Pain in left toe(s)  Difficulty walking  Cervicalgia  Rationale for Evaluation and Treatment Rehabilitation  ONSET DATE: 07/08/2021 Surgery  SUBJECTIVE:   SUBJECTIVE STATEMENT: 10/26/2021 Pt still has improved foot/toe pain, is still wearing new sneakers. ReEval for neck pain today, new referral.  She did see PCP for neck pain, still having pain in neck from when she fell into stair on 6/29. Notes minimal improvement since incident.  Pain mostly on L side, into L UT and into L side of head, with some associated headaches. Has had 3 prior neck surgeries with fusion.   Eval: Pt states that she has L foot pain and swelling since having surgery. Pain is the top of the foot and lateral. Pt has pain all the time walking, big toe mostly with walking. Putting her shoe on it hurts. She states her current shoes are too small and old. Pt has pain with any walking. Pushing off on the foot hurts. Pt denies red flag symptoms. Pt states she mainly sedentary due to the foot pain.   PERTINENT HISTORY: C/S surgery, Stroke, TIA, Hallux deformity+adhesive capsulitis+ hammertoe:  Severe structural bunion deformity with hallux interphalangeus and severe arthritis of the second MPJ left foot--surgical repair/osteotomies by [name] 04/2017.  PAIN:  Are you having pain? Yes: NPRS scale: 0-1/10 Pain location: L big toe Pain description: throbbing, sharp Aggravating factors: any movement Relieving factors: resting   Are you having pain? Yes: NPRS scale: 5-8 /10 Pain location: L  neck, Radiating into L side of head Pain description: numb, painful Aggravating factors:  Relieving factors:   PRECAUTIONS: None  WEIGHT BEARING RESTRICTIONS No  FALLS:  Has patient fallen in last 6 months? No  LIVING ENVIRONMENT: Lives with: lives alone Lives in: House/apartment Stairs: 1st level Has following equipment at home: None  OCCUPATION:  N/A  PLOF: Independent  PATIENT GOALS : Pt would like to get more mobile and improve walking.    OBJECTIVE:   DIAGNOSTIC FINDINGS: N/A   COGNITION:  Overall cognitive status: Within functional limits for tasks assessed     SENSATION: Light touch: Impaired   EDEMA:  Moderate edema between 2nd and 3rd digit, non pitting   POSTURE: increased thoracic kyphosis, flexed trunk , and weight shift right, pes planus  PALPATION: TTP dorsum of lateral foot along 3-5th rays, great toe plantar surface TTP and joint mobilization into ext  10/26/21: Tenderness/tightness in L UT, L cervical paraspinals, Sub occipitals,   LOWER EXTREMITY ROM:  MMT Right eval Left eval  Ankle dorsiflexion 10 3  Ankle plantarflexion [facility]  Ankle inversion 25 20  Ankle eversion 20 10   (Blank rows = not tested)  Great toe extension on L: 5 deg  10/26/21:   Cervical ROM:  L rot: 35,  R rot 60, Flex: mod limitation, Ext: mod/significant limitaiton.     LOWER EXTREMITY MMT:  Passive ROM Right eval Left eval  Ankle dorsiflexion 4+/5 4/5 p!  Ankle plantarflexion 4+/5 4/5 p!  Ankle inversion 4+/5 4-/5 p!  Ankle eversion 4+/5 4-/5 p!   (Blank rows = not tested)   FUNCTIONAL TESTS:    GAIT: Distance walked: 26f Assistive device utilized: None Level of assistance: Complete Independence Comments: decreased toe off on L, early heel off, lack of distinct heel strike; pain throughout   TODAY'S TREATMENT:  10/26/21 Therapeutic Exercise: Aerobic: Supine:  Seated: Great toe flexion to floor,  YTB x 15;  sit to stand x10;  Cervical rotation x 5 bil;  Standing:  shoulder rolls x 10; scap squeeze x 10,  Stretches:  Neuromuscular Re-education: Manual Therapy: STM to L UT, levator, paraspinals, SOR,  Self Care:    PATIENT EDUCATION:  Education details: reviewed and updated HEP, posture, sleeping posture, pillows for neck position.  Person educated: Patient Education method: Explanation,  Demonstration, Tactile cues, Verbal cues, and Handouts Education comprehension: verbalized understanding, returned demonstration, verbal cues required, and tactile cues  required   HOME EXERCISE PROGRAM:  Access Code: [email or web address]   ASSESSMENT:  CLINICAL IMPRESSION:      OBJECTIVE IMPAIRMENTS Abnormal gait, decreased activity tolerance, decreased balance, decreased coordination, decreased endurance, decreased knowledge of condition, decreased mobility, difficulty walking, decreased ROM, decreased strength, hypomobility, impaired flexibility, impaired sensation, improper body mechanics, postural dysfunction, and pain.   ACTIVITY LIMITATIONS carrying, lifting, standing, squatting, stairs, transfers, and locomotion level  PARTICIPATION LIMITATIONS: meal prep, cleaning, laundry, interpersonal relationship, driving, shopping, community activity, yard work, and exercise  PERSONAL FACTORS Age, Behavior pattern, Fitness, Past/current experiences, Time since onset of injury/illness/exacerbation, and 3+ comorbidities:    are also affecting patient's functional outcome.   REHAB POTENTIAL: Fair    CLINICAL DECISION MAKING: Evolving/moderate complexity  EVALUATION COMPLEXITY: Moderate   GOALS:   SHORT TERM GOALS: Target date: 11/04/2021   Pt will become independent with HEP in order to demonstrate synthesis of PT education.   Goal status: INITIAL  2.  Pt will report at least 2 pt reduction on NPRS scale for pain in order to demonstrate functional improvement with household activity, self care, and ADL.   Goal status: INITIAL  3.  Pt will score at least 19 pt increase on FOTO to demonstrate functional improvement in MCII and pt perceived function.    Goal status: INITIAL    LONG TERM GOALS: Target date: 12/16/2021   Pt  will become independent with final HEP in order to demonstrate synthesis of PT education.   Goal status: INITIAL  2.  Pt will score >/= 41 on FOTO to demonstrate  improvement in perceived L foot function.   Goal status: INITIAL  3.  Pt will be able to demonstrate/report ability to walk >15 mins without pain in order to demonstrate functional improvement and tolerance to exercise and community mobility.   Goal status: INITIAL  4.  Pt will be able to perform 5XSTS in under 12s  in order to demonstrate functional improvement above the cut off score for adults.   Goal status: INITIAL    5.  Pt to report decreased pain in neck, to 0-2/10 with activity, sleeping, and neck ROM.     Goal status: INITIAL/added 10/26/21       PLAN: PT FREQUENCY: 1-2x/week  PT DURATION: 12 weeks  PLANNED INTERVENTIONS: Therapeutic exercises, Therapeutic activity, Neuromuscular re-education, Balance training, Gait training, Patient/Family education, Joint manipulation, Joint mobilization, Stair training, Orthotic/Fit training, DME instructions, Aquatic Therapy, Dry Needling, Electrical stimulation, Spinal manipulation, Spinal mobilization, Moist heat, scar mobilization, Splintting, Taping, Vasopneumatic device, Traction, Ultrasound, Ionotophoresis '4mg'$ /ml Dexamethasone, Manual therapy, and Re-evaluation  PLAN FOR NEXT SESSION: standing balance, gait, LE strength, manual for neck pain, postural strength     Lyndee Hensen, PT, DPT 9:26 PM  10/26/21

## 2021-11-01 ENCOUNTER — Ambulatory Visit (INDEPENDENT_AMBULATORY_CARE_PROVIDER_SITE_OTHER): Payer: Medicare Other | Admitting: Family Medicine

## 2021-11-01 VITALS — BP 130/72 | HR 80 | Temp 98.0°F | Ht 65.5 in | Wt 183.4 lb

## 2021-11-01 DIAGNOSIS — F3177 Bipolar disorder, in partial remission, most recent episode mixed: Secondary | ICD-10-CM | POA: Diagnosis not present

## 2021-11-01 DIAGNOSIS — I10 Essential (primary) hypertension: Secondary | ICD-10-CM | POA: Diagnosis not present

## 2021-11-01 MED ORDER — RISPERIDONE 0.5 MG PO TABS: ORAL_TABLET | ORAL | 0 refills | Status: DC

## 2021-11-01 MED ORDER — NITROGLYCERIN 0.4 MG SL SUBL: 0.4000 mg | SUBLINGUAL_TABLET | SUBLINGUAL | 3 refills | Status: DC | PRN

## 2021-11-01 MED ORDER — OXYCODONE HCL 5 MG PO TABS: ORAL_TABLET | ORAL | 0 refills | Status: DC

## 2021-11-01 MED ORDER — HYDROCHLOROTHIAZIDE 25 MG PO TABS: 25.0000 mg | ORAL_TABLET | Freq: Every day | ORAL | 1 refills | Status: DC

## 2021-11-01 NOTE — Progress Notes (Signed)
OFFICE VISIT  11/01/2021  CC:  Chief Complaint  Patient presents with   Follow-up    Mood;    Patient is a 71 y.o. female who presents for 1 wk f/u bipolar disorder. A/P as of last visit: "#1 neck strain. Recommended physical therapy--referral today.. Continue meloxicam. She has some oxycodone and we discussed the potential dangers of using this medication for pain.  She expressed understanding. A new prescription for this medication was not needed today.   #2 bipolar disorder mixed phase with severe generalized anxiety. Psych referral to establish counseling. Add Risperdal 0.5 mg nightly x5 days, then increase to 2 nightly.  Continue Lamictal 300 mg a day and citalopram 20 mg a day. Referred to PT. Continue all current meds."  INTERIM HX: Doing well on one 0.'5mg'$  risperdal--she no longer has crying spells, feels like her thoughts are no longer racing, has slept better lately, has significantly improved motivation, less anhedonia, less hopelessness. Much improved!   No problems with Risperdal at the 0.5 mg dose.  She has started PT for her neck, very happy with the PT located at [facility]. Says her neck is improving.  Home blood pressures have been consistently elevated into the 546T to 035W systolic.  Usually normal diastolic.  ROS as above, plus--> no fevers, no CP, no SOB, no wheezing, no cough, no dizziness, no HAs, no rashes, no melena/hematochezia.  No polyuria or polydipsia.  No myalgias or arthralgias.  No focal weakness, paresthesias, or tremors.  No acute vision or hearing abnormalities.  No dysuria or unusual/new urinary urgency or frequency.  No recent changes in lower legs. No n/v/d or abd pain.  No palpitations.     Past Medical History:  Diagnosis Date   Alcoholism in remission [facility])    states last alcohol 11 yrs ago   Anemia    Anxiety    Bipolar 1 disorder ([place])    Admission for mania x 2 (most recent 11/2017)   Cervical spondylosis without  myelopathy 12/19/2013   MRI 02/2014: multilevel DDD/spondylosis, not much change compared to prior MRI.  Pt not in favor of invasive therapy for her neck as of 04/2014.   Cholelithiasis    COPD (chronic obstructive pulmonary disease) (HCC)    Moderate: anoro started 04/2017 by pulm, pt symptomatically improved and PFTs stable at f/u 06/2017.   Coronary artery spasm ([place]) 07/11/2018   nitrates=HA. Amlodipine=intol swelling.  Changed to coreg 09/03/18 by cardiologist.   Depression    Dilutional hyponatremia    Endometritis 05/2017   possible; empiric tx with Flagyl by GYN.   Environmental allergies    Fibromyalgia    GERD (gastroesophageal reflux disease)    Hepatic steatosis 03/2019   u/s abd   Herpes zoster 08/07/2017   L scapular region   Hiatal hernia    History of adenomatous polyp of colon 04/2008; 08/2014   No high grade dysplasia: recall 5 yrs ([name], [name])   History of basal cell carcinoma excision    NOSE   History of Helicobacter pylori infection 04/2008   +gastric biopsy (gastritis but no metaplasia, dysplasia, or malignancy identified)   History of kidney stones    History of TIA (transient ischemic attack)    secondary to HELLP syndrome 1994 post c/s--  residual memory loss   Hyperlipidemia    statin started after her NSTEMI: goal  LDL <70.   Hypertension    Internal hemorrhoids    Ischemic cardiomyopathy 05/2018   Echo  EF 45-50% in context of NSTEMI from CA spasm. 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 04/2017.   Leg pain    ABIs 11/2019: normal    Memory loss    Abnl MRI brain and CT brain c/w chronic microvascular ischemia.  Repeat MRI brain 02/2014 stable (Dr. Mayra Reel with no plan to f/u with neuro as of  04/2014)   NSTEMI (non-ST elevated myocardial infarction) ([place]) 05/2018   Echo EF 45-50% in context of NSTEMI from CA spasm.// Myoview 8/21: EF 66, no ischemia, low risk   Osteoarthritis of left wrist    STT and 1st [place] jt   Osteoporosis 05/2010   2012 'penia; 06/2015 'porosis:  prolia.  DEXA 09/2017 T-score -2.2 femur neck.  As of 11/2018 we're restarting prolia soon and will check next DEXA 1 yr from her next prolia injection.   Pelvic floor dysfunction    [facility] ([name])   Pneumonia    Postmenopausal vaginal bleeding 05/2017   x 1 small episode; GYN attempted endo bx but unable to penetrate cervix due to severe cerv stenosis (due to postmenopausal state + hx of LEEP).  Endo u/s showed thin endo lining.  Per GYN---no evidence of endomet pathology on w/u---obs as of 07/2017.   Prediabetes 06/2016   Fasting gluc 105; HbA1c at that time was 6.0%.  A1c 6.2% 12/2017.  A1c 5.7% Feb 2023.   Recurrent kidney stones 08/2013   Left ureteral calculus: Perc nephr--cystoscopy w/ureteroscopy + laser for stone removal.  Residual asymptomatic left renal nephrolithiasis <8m present post-procedure.  Right sided hydronephrosis-persistent (on u/s)--urol ordered CT to further eval 08/2016: 1.6 cm nonobstructing stone lower pole left kidney, no hydro, plan for PCN extraction ([facility])   Shortness of breath    Sprain of neck 12/19/2013   Stroke (HCC)    TIA   TMJ (temporomandibular joint disorder)    USES  MOUTH GUARD   Toe fracture, left 12/2017   2nd toe prox phalanx (immobilization, Dr. RZadie Rhine   Tubular adenoma of colon    Vitamin D deficiency 05/2011   Weak urinary stream 07/2016   with elevated PVR and mild left hydronephrosis secondary to this ([facility] started flomax 0.'4mg'$  qd for this).    Past Surgical History:  Procedure Laterality Date   ABIs  11/29/2019   NORMAL   ANTERIOR CERVICAL DECOMP/DISCECTOMY FUSION  06/30/2009   C3 -- C5  AND EXPLORATION OF FUSION C5-7 W/  PLATE REMOVAL    ARTHRODESIS METATARSALPHALANGEAL JOINT (MTPJ) Left 07/08/2021   Procedure: Left hallux metatarsophalangeal joint arthrodesis;  Surgeon: HWylene Simmer MD;  Location[place] [facility]  Service: Orthopedics;  Laterality: Left;   BACK SURGERY     BUNIONECTOMY Left    CARDIOVASCULAR STRESS TEST  12/03/2019   myoc perf im: NORMAL   CERVICAL FUSION  2002   anterior C5 -- C7   CERVICAL SPINE SURGERY  08/27/1999     C6 -- T1  LAMINECTOMY/  DISKECTOMY   CESAREAN SECTION  1994   CHOLECYSTECTOMY N/A 03/06/2020   Procedure: LAPAROSCOPIC CHOLECYSTECTOMY;  Surgeon: SFelicie Morn MD;  [place] [facility]  Service: General;  Laterality: N/A;   COLONOSCOPY W/ POLYPECTOMY  04/2008;08/2014   2016 tubular adenoma x 1; +diverticulosis and int/ext hemorrhoids.  06/2020  adenoma, recall 5 yrs.   CYSTOSCOPY WITH URETEROSCOPY AND STENT PLACEMENT Left 08/28/2013   Procedure: CYSTOSCOPY, RGP,  WITH URETEROSCOPY AND STENT REMOVAL;  Surgeon: [name], MD;  Location[facility];  Service: Urology;  Laterality: Left;   DEXA  09/2017   T-score -2.2 femur neck: improved compared to 06/2015.   ESOPHAGOGASTRODUODENOSCOPY  2010; 08/2014   2016 +Candidal esophagitis; Mild chronic gastritis w/intestinal metaplasia--NEG H pylori, neg for eosinophilic esoph. 05/2023 esoph candidiasis (diflucan x 20d), o/w normal.   HAMMER TOE SURGERY Left 07/08/2021   Procedure: Second metatarsal head excision; revision Second hammertoe correction; 2-5 percutaneus flexor tenotomies;  Surgeon: Wylene Simmer, MD;  Location: [place];  Service: Orthopedics;  Laterality: Left;   HARDWARE REMOVAL Left 07/08/2021   Procedure: Removal of deep implants from hallux proximal phalanx and First and Second metatarsals;  Surgeon: Wylene Simmer, MD;  Location: [place];  Service: Orthopedics;  Laterality: Left;   HOLMIUM LASER APPLICATION Left 42/70/6237   Procedure: HOLMIUM LASER APPLICATION;   Surgeon: [name], MD;  Location[facility];  Service: Urology;  Laterality: Left;   JOINT REPLACEMENT     LEFT HEART CATH AND CORONARY ANGIOGRAPHY N/A 05/15/2018   Evidence for spasm in distal LAD.  Mod RCA atherosclerosis, o/w no CAD.  Procedure: LEFT HEART CATH AND CORONARY ANGIOGRAPHY;  Surgeon: Leonie Man, MD;  Location: [place] [facility];  Service: Cardiovascular;  Laterality: N/A;   NEGATIVE SLEEP STUDY  04/01/2007   NEPHROLITHOTOMY Left 08/05/2013   Procedure: NEPHROLITHOTOMY PERCUTANEOUS;  Surgeon: [name], MD;  Location: [facility];  Service: Urology;  Laterality: Left;   POLYSOMNOGRAM  01/2019   NORMAL   POLYSOMNOGRAPHY  10/25/2018   Dr. Dion Saucier due to pt inadequat sleep time (83 min).   TONSILLECTOMY AND ADENOIDECTOMY  1975   TOTAL KNEE ARTHROPLASTY Right 2003   TRANSTHORACIC ECHOCARDIOGRAM  05/2018; 12/03/2019   (after MI secondary to arterial spasm): EF 45-50%; severe hypokinesis of apical anterolateral, inferolateral , anterior, and inferior LV myocardium. 11/2019 EF 55-60%, valves fine   ULTRASOUND EXAM,PELVIC COMPLETE ([facility] HX)  06/25/2015   NORMAL (done by GYN)    Outpatient Medications Prior to Visit  Medication Sig Dispense Refill   acetaminophen (TYLENOL) 500 MG tablet Take 1,000 mg by mouth every 6 (six) hours as needed for mild pain or headache.      albuterol (VENTOLIN HFA) 108 (90 Base) MCG/ACT inhaler INHALE 2 PUFFS INTO THE LUNGS EVERY 4 HOURS AS NEEDED FOR WHEEZE OR FOR SHORTNESS OF BREATH 8.5 each 2   ALPRAZolam (XANAX) 0.5 MG tablet TAKE 1 TABLET BY MOUTH THREE TIMES A DAY AS NEEDED FOR ANXIETY 90 tablet 5   aspirin EC 81 MG EC tablet Take 1 tablet (81 mg total) by mouth daily. 90 tablet 3   atorvastatin (LIPITOR) 80 MG tablet TAKE 1 TABLET BY MOUTH DAILY AT 6 PM. 90 tablet 3   BESIVANCE 0.6 % SUSP Place 1 drop into the right eye 3 (three) times daily.     citalopram (CELEXA) 20 MG tablet Take 1 tablet (20 mg  total) by mouth in the morning and at bedtime. 180 tablet 3   cyclobenzaprine (FLEXERIL) 10 MG tablet TAKE 1 TABLET BY MOUTH TWICE A DAY 60 tablet 1   doxycycline (VIBRAMYCIN) 50 MG capsule Take 50 mg by mouth daily.     famotidine (PEPCID) 40 MG tablet TAKE 1 TABLET BY MOUTH EVERY DAY  90 tablet 3   fluticasone (FLONASE) 50 MCG/ACT nasal spray SPRAY 2 SPRAYS INTO EACH NOSTRIL EVERY DAY 48 mL 1   lamoTRIgine (LAMICTAL) 100 MG tablet Take 3 tablets (300 mg total) by mouth at bedtime. 270 tablet 3   losartan (COZAAR) 25 MG tablet TAKE 1 TABLET BY MOUTH EVERY DAY 90 tablet 3   Magnesium 250 MG TABS Take 250 mg by mouth daily.      meloxicam (MOBIC) 15 MG tablet Take 1 tablet (15 mg total) by mouth daily. 15 tablet 0   metoprolol succinate (TOPROL-XL) 100 MG 24 hr tablet Take 1 tablet (100 mg total) by mouth daily. Take with or immediately following a meal. 90 tablet 3   montelukast (SINGULAIR) 10 MG tablet TAKE 1 TABLET BY MOUTH EVERY DAY IN THE EVENING 90 tablet 1   Multiple Vitamin (MULTI-VITAMINS) TABS Take 1 tablet by mouth daily.      pantoprazole (PROTONIX) 40 MG tablet TAKE 1 TABLET BY MOUTH EVERY DAY 90 tablet 0   polyethylene glycol powder (GLYCOLAX/MIRALAX) powder TAKE 17 G BY MOUTH DAILY. 3162 g 0   prednisoLONE acetate (PRED FORTE) 1 % ophthalmic suspension Place 1 drop into the right eye 4 (four) times daily.     PROLENSA 0.07 % SOLN Place 1 drop into the right eye at bedtime.     Propylene Glycol 0.6 % SOLN Apply 1 drop to eye 2 (two) times daily.     tamsulosin (FLOMAX) 0.4 MG CAPS capsule Take 0.4 mg by mouth daily.     Tiotropium Bromide-Olodaterol (STIOLTO RESPIMAT) 2.5-2.5 MCG/ACT AERS INHALE 2 PUFFS BY MOUTH INTO THE LUNGS DAILY 4 g 5   triamcinolone cream (KENALOG) 0.1 % Apply 1 application topically daily.     zolpidem (AMBIEN) 10 MG tablet TAKE 1 TABLET BY MOUTH EVERYDAY AT BEDTIME 90 tablet 1   hydrochlorothiazide (HYDRODIURIL) 12.5 MG tablet TAKE 1 TABLET BY MOUTH EVERY DAY  90 tablet 2   nitroGLYCERIN (NITROSTAT) 0.4 MG SL tablet Place 1 tablet (0.4 mg total) under the tongue every 5 (five) minutes x 3 doses as needed for chest pain. 25 tablet 3   oxyCODONE (OXY IR/ROXICODONE) 5 MG immediate release tablet 1 tab po q6h prn pain 30 tablet 0   risperiDONE (RISPERDAL) 0.5 MG tablet 1 tab po qhs 30 tablet 0   ipratropium-albuterol (DUONEB) 0.5-2.5 (3) MG/3ML SOLN TAKE 3 MLS BY NEBULIZATION 3 (THREE) TIMES DAILY AS NEEDED. (Patient not taking: Reported on 11/01/2021) 360 mL 3   No facility-administered medications prior to visit.    Allergies  Allergen Reactions   Ceftin [Cefuroxime Axetil] Anaphylaxis    Swelling of eyes and tongue   Ciprofloxacin Anaphylaxis    Difficulty breathing and generalized swelling   Fosamax [Alendronate Sodium] Diarrhea   Morphine And Related Other (See Comments)    Hallucinations/ disoriented - pt stated, "Only Morphine" - 03/23/17   Oxycodone Other (See Comments)    Patient was "hooked" on medication.   Prednisone Other (See Comments)    Hyperactivity   Seroquel [Quetiapine] Other (See Comments)    oversedation    ROS As per HPI  PE:    11/01/2021    1:31 PM 10/25/2021    1:08 PM 10/25/2021    1:05 PM  Vitals with BMI  Height 5' 5.5"    Weight 183 lbs 6 oz  183 lbs 3 oz  BMI 06.23  76.28  Systolic 315 176 160  Diastolic 72 75 85  Pulse 80  67     Physical Exam  Gen: Alert, well appearing.  Patient is oriented to person, place, time, and situation.. AFFECT: pleasant, appropriate.  Lucid thought and speech. No further exam today  LABS:  Last CBC Lab Results  Component Value Date   WBC 5.9 10/29/2020   HGB 12.4 10/29/2020   HCT 35.9 (L) 10/29/2020   MCV 91.5 10/29/2020   MCH 30.3 03/02/2020   RDW 13.1 10/29/2020   PLT 264.0 07/62/2633   Last metabolic panel Lab Results  Component Value Date   GLUCOSE 101 (H) 07/01/2021   NA 136 07/01/2021   K 3.8 07/01/2021   CL 102 07/01/2021   CO2 27 07/01/2021    BUN 11 07/01/2021   CREATININE 0.95 07/01/2021   GFRNONAA >60 07/01/2021   CALCIUM 9.5 07/01/2021   PROT 6.6 05/06/2021   ALBUMIN 4.6 05/06/2021   BILITOT 0.8 05/06/2021   ALKPHOS 55 05/06/2021   AST 23 05/06/2021   ALT 22 05/06/2021   ANIONGAP 7 07/01/2021   Last hemoglobin A1c Lab Results  Component Value Date   HGBA1C 5.7 05/06/2021   Last thyroid functions Lab Results  Component Value Date   TSH 1.55 02/19/2019   IMPRESSION AND PLAN:  1 uncontrolled hypertension. Increase HCTZ to 25 mg a day.  Continue losartan 25 mg daily.  #2 bipolar disorder, mixed episode. Significant improvement with starting Risperdal 0.5 mg nightly. We will continue this and continue her citalopram 20 mg daily and Lamictal 300 mg daily.  An After Visit Summary was printed and given to the patient.  FOLLOW UP: No follow-ups on file.  Signed:  Crissie Sickles, MD           11/01/2021

## 2021-11-02 ENCOUNTER — Encounter: Payer: Self-pay | Admitting: Physical Therapy

## 2021-11-02 ENCOUNTER — Ambulatory Visit: Payer: Medicare Other | Admitting: Physical Therapy

## 2021-11-02 DIAGNOSIS — R262 Difficulty in walking, not elsewhere classified: Secondary | ICD-10-CM

## 2021-11-02 DIAGNOSIS — M542 Cervicalgia: Secondary | ICD-10-CM | POA: Diagnosis not present

## 2021-11-02 DIAGNOSIS — M79675 Pain in left toe(s): Secondary | ICD-10-CM

## 2021-11-02 DIAGNOSIS — M25675 Stiffness of left foot, not elsewhere classified: Secondary | ICD-10-CM | POA: Diagnosis not present

## 2021-11-02 NOTE — Therapy (Signed)
OUTPATIENT PHYSICAL THERAPY LOWER EXTREMITY TREATMENT   Patient Name: Erin Lopez MRN: [id number] DOB:Apr 21, 1950, 71 y.o., female Today's Date: 11/02/2021   PT End of Session - 11/02/21 1045     Visit Number 5    Number of Visits 13    Date for PT Re-Evaluation 12/22/21    Authorization Type U[employer] MCR    PT Start Time 1025    PT Stop Time 1103    PT Time Calculation (min) 38 min    Activity Tolerance Patient tolerated treatment well    Behavior During Therapy WFL for tasks assessed/performed               Past Medical History:  Diagnosis Date   Alcoholism in remission ([place])    states last alcohol 11 yrs ago   Anemia    Anxiety    Bipolar 1 disorder ([place])    Admission for mania x 2 (most recent 11/2017)   Cervical spondylosis without myelopathy 12/19/2013   MRI 02/2014: multilevel DDD/spondylosis, not much change compared to prior MRI.  Pt not in favor of invasive therapy for her neck as of 04/2014.   Cholelithiasis    COPD (chronic obstructive pulmonary disease) (HCC)    Moderate: anoro started 04/2017 by pulm, pt symptomatically improved and PFTs stable at f/u 06/2017.   Coronary artery spasm ([place]) 07/11/2018   nitrates=HA. Amlodipine=intol swelling.  Changed to coreg 09/03/18 by cardiologist.   Depression    Dilutional hyponatremia    Endometritis 05/2017   possible; empiric tx with Flagyl by GYN.   Environmental allergies    Fibromyalgia    GERD (gastroesophageal reflux disease)    Hepatic steatosis 03/2019   u/s abd   Herpes zoster 08/07/2017   L scapular region   Hiatal hernia    History of adenomatous polyp of colon 04/2008; 08/2014   No high grade dysplasia: recall 5 yrs ([name], [name])   History of basal cell carcinoma excision    NOSE   History of Helicobacter pylori infection 04/2008   +gastric biopsy (gastritis but no metaplasia, dysplasia, or malignancy identified)   History of kidney stones    History of TIA (transient ischemic attack)     secondary to HELLP syndrome 1994 post c/s--  residual memory loss   Hyperlipidemia    statin started after her NSTEMI: goal  LDL <70.   Hypertension    Internal hemorrhoids    Ischemic cardiomyopathy 05/2018   Echo EF 45-50% in context of NSTEMI from CA spasm. 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 04/2017.   Leg pain    ABIs 11/2019: normal    Memory loss    Abnl MRI brain and CT brain c/w chronic microvascular ischemia.  Repeat MRI brain 02/2014 stable (Dr. Mayra Reel with no plan to f/u with neuro as of 04/2014)   NSTEMI (non-ST elevated myocardial infarction) ([place]) 05/2018   Echo EF 45-50% in context of NSTEMI from CA spasm.// Myoview 8/21: EF 66, no ischemia, low risk   Osteoarthritis of left wrist    STT and 1st [place] jt   Osteoporosis 05/2010   2012 'penia; 06/2015 'porosis:  prolia.  DEXA 09/2017 T-score -2.2 femur neck.  As of 11/2018 we're restarting prolia soon and will check next DEXA  1 yr from her next prolia injection.   Pelvic floor dysfunction    [facility] ([name])   Pneumonia    Postmenopausal vaginal bleeding 05/2017   x 1 small episode; GYN attempted endo bx but unable to penetrate cervix due to severe cerv stenosis (due to postmenopausal state + hx of LEEP).  Endo u/s showed thin endo lining.  Per GYN---no evidence of endomet pathology on w/u---obs as of 07/2017.   Prediabetes 06/2016   Fasting gluc 105; HbA1c at that time was 6.0%.  A1c 6.2% 12/2017.  A1c 5.7% Feb 2023.   Recurrent kidney stones 08/2013   Left ureteral calculus: Perc nephr--cystoscopy w/ureteroscopy + laser for stone removal.  Residual asymptomatic left renal nephrolithiasis <17m present post-procedure.  Right sided hydronephrosis-persistent (on  u/s)--urol ordered CT to further eval 08/2016: 1.6 cm nonobstructing stone lower pole left kidney, no hydro, plan for PCN extraction ([facility])   Shortness of breath    Sprain of neck 12/19/2013   Stroke (HCC)    TIA   TMJ (temporomandibular joint disorder)    USES  MOUTH GUARD   Toe fracture, left 12/2017   2nd toe prox phalanx (immobilization, Dr. RZadie Rhine   Tubular adenoma of colon    Vitamin D deficiency 05/2011   Weak urinary stream 07/2016   with elevated PVR and mild left hydronephrosis secondary to this ([facility] started flomax 0.'4mg'$  qd for this).   Past Surgical History:  Procedure Laterality Date   ABIs  11/29/2019   NORMAL   ANTERIOR CERVICAL DECOMP/DISCECTOMY FUSION  06/30/2009   C3 -- C5  AND EXPLORATION OF FUSION C5-7 W/  PLATE REMOVAL   ARTHRODESIS METATARSALPHALANGEAL JOINT (MTPJ) Left 07/08/2021   Procedure: Left hallux metatarsophalangeal joint arthrodesis;  Surgeon: HWylene Simmer MD;  [place] [facility]  Service: Orthopedics;  Laterality: Left;   BACK SURGERY     BUNIONECTOMY Left    CARDIOVASCULAR STRESS TEST  12/03/2019   myoc perf im: NORMAL   CERVICAL FUSION  2002   anterior C5 -- C7   CERVICAL SPINE SURGERY  08/27/1999     C6 -- T1  LAMINECTOMY/  DISKECTOMY   CESAREAN SECTION  1994   CHOLECYSTECTOMY N/A 03/06/2020   Procedure: LAPAROSCOPIC CHOLECYSTECTOMY;  Surgeon: SFelicie Morn MD;  [facility] [facility]  Service: General;  Laterality: N/A;   COLONOSCOPY W/ POLYPECTOMY  04/2008;08/2014   2016 tubular adenoma x 1; +diverticulosis and int/ext hemorrhoids.  06/2020 adenoma, recall 5 yrs.   CYSTOSCOPY WITH URETEROSCOPY AND STENT PLACEMENT Left 08/28/2013   Procedure: CYSTOSCOPY, RGP,  WITH URETEROSCOPY AND STENT REMOVAL;  Surgeon: DBernestine Amass MD;  [facility]  Service: Urology;  Laterality: Left;   DEXA  09/2017   T-score -2.2 femur neck: improved compared to 06/2015.   ESOPHAGOGASTRODUODENOSCOPY  2010;  08/2014   2016 +Candidal esophagitis; Mild chronic gastritis w/intestinal metaplasia--NEG H pylori, neg for eosinophilic esoph. 33/0160esoph candidiasis (diflucan x 20d), o/w normal.   HAMMER TOE SURGERY Left 07/08/2021   Procedure: Second metatarsal head excision; revision Second hammertoe correction; 2-5 percutaneus flexor tenotomies;  Surgeon: HWylene Simmer MD;  [place] [facility]  Service: Orthopedics;  Laterality: Left;   HARDWARE REMOVAL Left 07/08/2021   Procedure: Removal of deep implants from hallux proximal phalanx and First and Second metatarsals;  Surgeon: HWylene Simmer MD;  [place] [facility]  Service: Orthopedics;  Laterality: Left;   HOLMIUM LASER APPLICATION Left 010/93/2355  Procedure: HOLMIUM  LASER APPLICATION;  Surgeon: [name], MD;  [facility];  Service: Urology;  Laterality: Left;   JOINT REPLACEMENT     LEFT HEART CATH AND CORONARY ANGIOGRAPHY N/A 05/15/2018   Evidence for spasm in distal LAD.  Mod RCA atherosclerosis, o/w no CAD.  Procedure: LEFT HEART CATH AND CORONARY ANGIOGRAPHY;  Surgeon: Leonie Man, MD;  Location: [facility];  Service: Cardiovascular;  Laterality: N/A;   NEGATIVE SLEEP STUDY  04/01/2007   NEPHROLITHOTOMY Left 08/05/2013   Procedure: NEPHROLITHOTOMY PERCUTANEOUS;  Surgeon: [name], MD;  Location: [facility];  Service: Urology;  Laterality: Left;   POLYSOMNOGRAM  01/2019   NORMAL   POLYSOMNOGRAPHY  10/25/2018   Dr. Dion Saucier due to pt inadequat sleep time (83 min).   TONSILLECTOMY AND ADENOIDECTOMY  1975   TOTAL KNEE ARTHROPLASTY Right 2003   TRANSTHORACIC ECHOCARDIOGRAM  05/2018; 12/03/2019   (after MI secondary to arterial spasm): EF 45-50%; severe hypokinesis of apical anterolateral, inferolateral , anterior, and inferior LV myocardium. 11/2019 EF 55-60%, valves fine   ULTRASOUND EXAM,PELVIC COMPLETE ([place] [facility])  06/25/2015   NORMAL (done by GYN)    Patient Active Problem List   Diagnosis Date Noted   Shortness of breath 04/19/2019   Healthcare maintenance 04/19/2019   Overweight (BMI 25.0-29.9) 02/19/2019   Ischemic cardiomyopathy 09/03/2018   Chest pain 07/11/2018   Coronary vasospasm ([place]) 07/11/2018   NSTEMI (non-ST elevated myocardial infarction) ([place]) 05/14/2018   Acute encephalopathy 11/19/2017   Hyponatremia 11/16/2017   Bipolar 1 disorder ([place]) 12/29/2016   Hypertension 12/29/2016   TIA (transient ischemic attack) 12/29/2016   Chronic pelvic pain in female 08/22/2016   Stage 2 chronic kidney disease 08/22/2016   Osteoporosis 09/30/2015   Health maintenance examination 06/11/2015   Suprapubic discomfort 06/05/2015   Urethral caruncle 06/05/2015   Infection of urinary tract 06/05/2015   TMJ (temporomandibular joint disorder) 02/25/2015   Right ankle injury 11/05/2014   Cervical spondylosis without myelopathy 12/19/2013   Sprain of neck 12/19/2013   Memory difficulties 12/19/2013   Postnasal drip 10/28/2013   Renal calculus 08/05/2013   Urinary frequency 08/01/2013   Constipation, chronic 07/29/2013   COPD (chronic obstructive pulmonary disease) ([place]) 06/30/2013   Abnormal chest x-ray 06/30/2013   Benign essential HTN 05/29/2013   Kidney stone on left side 05/29/2013   Hyperlipidemia 05/29/2013    PCP: Tammi Sou, MD  REFERRING PROVIDER:  foot: Corky Sing, PA-C     Neck: Phlip McGowen  REFERRING DIAG: M20.22 (ICD-10-CM) - Hallux rigidus, left foot  Procedure(s): 1.  Left first metatarsal removal of deep implant 2.  Left hallux proximal phalanx removal of deep implant 3.  Left hallux metatarsophalangeal joint arthrodesis 4.  Removal of deep implant from the left second metatarsal 5.  Left second metatarsal head excision 6.  Left second hammertoe correction 7.  Left third, fourth and fifth toe flexor digitorum longus tenotomies 8.  AP, lateral and oblique radiographs of the left  foot  THERAPY DIAG:  Stiffness of left foot, not elsewhere classified  Pain in left toe(s)  Difficulty walking  Cervicalgia  Rationale for Evaluation and Treatment Rehabilitation  ONSET DATE: 07/08/2021 Surgery  SUBJECTIVE:   SUBJECTIVE STATEMENT: 11/02/2021 Pt states improving neck pain, still getting some headaches.  States having more good days than bad and feeling better overall, able to do more activity at home.    Eval: Pt states that she has L foot pain and swelling since  having surgery. Pain is the top of the foot and lateral. Pt has pain all the time walking, big toe mostly with walking. Putting her shoe on it hurts. She states her current shoes are too small and old. Pt has pain with any walking. Pushing off on the foot hurts. Pt denies red flag symptoms. Pt states she mainly sedentary due to the foot pain.   PERTINENT HISTORY: C/S surgery, Stroke, TIA, Hallux deformity+adhesive capsulitis+ hammertoe:  Severe structural bunion deformity with hallux interphalangeus and severe arthritis of the second MPJ left foot--surgical repair/osteotomies by [name] 04/2017.  PAIN:  Are you having pain? Yes: NPRS scale: 0-1/10 Pain location: L big toe Pain description: throbbing, sharp Aggravating factors: any movement Relieving factors: resting   Are you having pain? Yes: NPRS scale: 5-8 /10 Pain location: L  neck, Radiating into L side of head Pain description: numb, painful Aggravating factors:  Relieving factors:   PRECAUTIONS: None  WEIGHT BEARING RESTRICTIONS No  FALLS:  Has patient fallen in last 6 months? No  LIVING ENVIRONMENT: Lives with: lives alone Lives in: House/apartment Stairs: 1st level Has following equipment at home: None  OCCUPATION: N/A  PLOF: Independent  PATIENT GOALS : Pt would like to get more mobile and improve walking.    OBJECTIVE:   DIAGNOSTIC FINDINGS: N/A   COGNITION:  Overall cognitive status: Within functional limits for  tasks assessed     SENSATION: Light touch: Impaired   EDEMA:  Moderate edema between 2nd and 3rd digit, non pitting   POSTURE: increased thoracic kyphosis, flexed trunk , and weight shift right, pes planus  PALPATION: TTP dorsum of lateral foot along 3-5th rays, great toe plantar surface TTP and joint mobilization into ext  10/26/21: Tenderness/tightness in L UT, L cervical paraspinals, Sub occipitals,   LOWER EXTREMITY ROM:  MMT Right eval Left eval  Ankle dorsiflexion 10 3  Ankle plantarflexion [facility]  Ankle inversion 25 20  Ankle eversion 20 10   (Blank rows = not tested)  Great toe extension on L: 5 deg  10/26/21:   Cervical ROM:  L rot: 35,  R rot 60, Flex: mod limitation, Ext: mod/significant limitaiton.     LOWER EXTREMITY MMT:  Passive ROM Right eval Left eval  Ankle dorsiflexion 4+/5 4/5 p!  Ankle plantarflexion 4+/5 4/5 p!  Ankle inversion 4+/5 4-/5 p!  Ankle eversion 4+/5 4-/5 p!   (Blank rows = not tested)   FUNCTIONAL TESTS:    GAIT: Distance walked: 37f Assistive device utilized: None Level of assistance: Complete Independence Comments: decreased toe off on L, early heel off, lack of distinct heel strike; pain throughout   TODAY'S TREATMENT:  11/02/21 Therapeutic Exercise: Aerobic: Supine:  Seated:   sit to stand x10;   Standing:  Scap squeeze x 10, mini squats x 10; Hip abd 2x10 bil; Stride stance weight shifts for gait x 20 bil; Slow march x 20; reaching fwd/outside of BOS x 10 bil; Tandem stance 30 sec x 2 bil;  Stretches:  Neuromuscular Re-education: Manual Therapy: STM to L and R UT, levator, paraspinals, SOR, Manual stretching for UT and levator bil;  Self Care:    Previous:  Therapeutic Exercise: Aerobic: Supine:  Seated: Great toe flexion to floor,  YTB x 15;  sit to stand x10;  Cervical rotation x 5 bil;  Standing:  shoulder rolls x 10; scap squeeze x 10,  Stretches:  Neuromuscular Re-education: Manual Therapy: STM to L  UT, levator, paraspinals, SOR,  Self  Care:    PATIENT EDUCATION:  Education details: reviewed and updated HEP, posture, sleeping posture, pillows for neck position.  Person educated: Patient Education method: Explanation, Demonstration, Tactile cues, Verbal cues, and Handouts Education comprehension: verbalized understanding, returned demonstration, verbal cues required, and tactile cues required   HOME EXERCISE PROGRAM:  Access Code: [email or web address]   ASSESSMENT:  CLINICAL IMPRESSION: 11/02/2021 Pt with improving ability and tolerance for strengthening and ther ex. She does have decreased stability, focus on standing exercises for this today. Improved ability for forward reach with less lob after education and practice today for keeping COG posterior. L rotation ROM for neck improving from last visit, still with muscle tension and soreness. Plan to progress as tolerated.     OBJECTIVE IMPAIRMENTS Abnormal gait, decreased activity tolerance, decreased balance, decreased coordination, decreased endurance, decreased knowledge of condition, decreased mobility, difficulty walking, decreased ROM, decreased strength, hypomobility, impaired flexibility, impaired sensation, improper body mechanics, postural dysfunction, and pain.   ACTIVITY LIMITATIONS carrying, lifting, standing, squatting, stairs, transfers, and locomotion level  PARTICIPATION LIMITATIONS: meal prep, cleaning, laundry, interpersonal relationship, driving, shopping, community activity, yard work, and exercise  PERSONAL FACTORS Age, Behavior pattern, Fitness, Past/current experiences, Time since onset of injury/illness/exacerbation, and 3+ comorbidities:    are also affecting patient's functional outcome.   REHAB POTENTIAL: Fair    CLINICAL DECISION MAKING: Evolving/moderate complexity  EVALUATION COMPLEXITY: Moderate   GOALS:   SHORT TERM GOALS: Target date: 11/04/2021   Pt will become independent with HEP in order to  demonstrate synthesis of PT education.   Goal status: INITIAL  2.  Pt will report at least 2 pt reduction on NPRS scale for pain in order to demonstrate functional improvement with household activity, self care, and ADL.   Goal status: INITIAL  3.  Pt will score at least 19 pt increase on FOTO to demonstrate functional improvement in MCII and pt perceived function.    Goal status: INITIAL    LONG TERM GOALS: Target date: 12/16/2021   Pt  will become independent with final HEP in order to demonstrate synthesis of PT education.   Goal status: INITIAL  2.  Pt will score >/= 41 on FOTO to demonstrate improvement in perceived L foot function.   Goal status: INITIAL  3.  Pt will be able to demonstrate/report ability to walk >15 mins without pain in order to demonstrate functional improvement and tolerance to exercise and community mobility.   Goal status: INITIAL  4.  Pt will be able to perform 5XSTS in under 12s  in order to demonstrate functional improvement above the cut off score for adults.   Goal status: INITIAL    5.  Pt to report decreased pain in neck, to 0-2/10 with activity, sleeping, and neck ROM.     Goal status: INITIAL/added 10/26/21       PLAN: PT FREQUENCY: 1-2x/week  PT DURATION: 12 weeks  PLANNED INTERVENTIONS: Therapeutic exercises, Therapeutic activity, Neuromuscular re-education, Balance training, Gait training, Patient/Family education, Joint manipulation, Joint mobilization, Stair training, Orthotic/Fit training, DME instructions, Aquatic Therapy, Dry Needling, Electrical stimulation, Spinal manipulation, Spinal mobilization, Moist heat, scar mobilization, Splintting, Taping, Vasopneumatic device, Traction, Ultrasound, Ionotophoresis '4mg'$ /ml Dexamethasone, Manual therapy, and Re-evaluation  PLAN FOR NEXT SESSION: standing balance, gait, LE strength, manual for neck pain, postural strength     Lyndee Hensen, PT, DPT 1:54 PM  11/02/21

## 2021-11-04 ENCOUNTER — Ambulatory Visit (HOSPITAL_BASED_OUTPATIENT_CLINIC_OR_DEPARTMENT_OTHER)
Admission: RE | Admit: 2021-11-04 | Discharge: 2021-11-04 | Disposition: A | Discharge status: Home / Self Care | Payer: Medicare Other | Source: Ambulatory Visit | Attending: Family Medicine | Admitting: Family Medicine

## 2021-11-04 DIAGNOSIS — M810 Age-related osteoporosis without current pathological fracture: Secondary | ICD-10-CM | POA: Insufficient documentation

## 2021-11-04 DIAGNOSIS — Z9289 Personal history of other medical treatment: Secondary | ICD-10-CM | POA: Insufficient documentation

## 2021-11-04 DIAGNOSIS — Z1239 Encounter for other screening for malignant neoplasm of breast: Secondary | ICD-10-CM | POA: Insufficient documentation

## 2021-11-04 DIAGNOSIS — Z0000 Encounter for general adult medical examination without abnormal findings: Secondary | ICD-10-CM | POA: Diagnosis not present

## 2021-11-04 DIAGNOSIS — M8589 Other specified disorders of bone density and structure, multiple sites: Secondary | ICD-10-CM | POA: Diagnosis not present

## 2021-11-04 DIAGNOSIS — Z780 Asymptomatic menopausal state: Secondary | ICD-10-CM | POA: Diagnosis not present

## 2021-11-06 ENCOUNTER — Other Ambulatory Visit: Payer: Self-pay | Admitting: Family Medicine

## 2021-11-09 ENCOUNTER — Encounter: Payer: Self-pay | Admitting: Family Medicine

## 2021-11-09 ENCOUNTER — Telehealth: Payer: Self-pay

## 2021-11-09 ENCOUNTER — Ambulatory Visit: Payer: Medicare Other | Admitting: Physical Therapy

## 2021-11-09 DIAGNOSIS — M25675 Stiffness of left foot, not elsewhere classified: Secondary | ICD-10-CM | POA: Diagnosis not present

## 2021-11-09 DIAGNOSIS — R262 Difficulty in walking, not elsewhere classified: Secondary | ICD-10-CM

## 2021-11-09 DIAGNOSIS — M79675 Pain in left toe(s): Secondary | ICD-10-CM | POA: Diagnosis not present

## 2021-11-09 DIAGNOSIS — M542 Cervicalgia: Secondary | ICD-10-CM

## 2021-11-09 NOTE — Therapy (Signed)
OUTPATIENT PHYSICAL THERAPY LOWER EXTREMITY TREATMENT   Patient Name: Erin Lopez MRN: [id number] DOB:Oct 10, 1950, 71 y.o., female Today's Date: 11/09/2021   PT End of Session - 11/09/21 1017     Visit Number 6    Number of Visits 13    Date for PT Re-Evaluation 12/22/21    Authorization Type U[employer] MCR    PT Start Time 5852    PT Stop Time 1057    PT Time Calculation (min) 38 min    Activity Tolerance Patient tolerated treatment well    Behavior During Therapy WFL for tasks assessed/performed               Past Medical History:  Diagnosis Date   Alcoholism in remission ([place])    states last alcohol 11 yrs ago   Anemia    Anxiety    Bipolar 1 disorder ([place])    Admission for mania x 2 (most recent 11/2017)   Cervical spondylosis without myelopathy 12/19/2013   MRI 02/2014: multilevel DDD/spondylosis, not much change compared to prior MRI.  Pt not in favor of invasive therapy for her neck as of 04/2014.   Cholelithiasis    COPD (chronic obstructive pulmonary disease) (HCC)    Moderate: anoro started 04/2017 by pulm, pt symptomatically improved and PFTs stable at f/u 06/2017.   Coronary artery spasm ([place]) 07/11/2018   nitrates=HA. Amlodipine=intol swelling.  Changed to coreg 09/03/18 by cardiologist.   Depression    Dilutional hyponatremia    Endometritis 05/2017   possible; empiric tx with Flagyl by GYN.   Environmental allergies    Fibromyalgia    GERD (gastroesophageal reflux disease)    Hepatic steatosis 03/2019   u/s abd   Herpes zoster 08/07/2017   L scapular region   Hiatal hernia    History of adenomatous polyp of colon 04/2008; 08/2014   No high grade dysplasia: recall 5 yrs ([name], [name])   History of basal cell carcinoma excision    NOSE   History of Helicobacter pylori infection 04/2008   +gastric biopsy (gastritis but no metaplasia, dysplasia, or malignancy identified)   History of kidney stones    History of TIA (transient ischemic attack)     secondary to HELLP syndrome 1994 post c/s--  residual memory loss   Hyperlipidemia    statin started after her NSTEMI: goal  LDL <70.   Hypertension    Internal hemorrhoids    Ischemic cardiomyopathy 05/2018   Echo EF 45-50% in context of NSTEMI from CA spasm. 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 04/2017.   Leg pain    ABIs 11/2019: normal    Memory loss    Abnl MRI brain and CT brain c/w chronic microvascular ischemia.  Repeat MRI brain 02/2014 stable (Dr. Mayra Reel with no plan to f/u with neuro as of 04/2014)   NSTEMI (non-ST elevated myocardial infarction) ([place]) 05/2018   Echo EF 45-50% in context of NSTEMI from CA spasm.// Myoview 8/21: EF 66, no ischemia, low risk   Osteoarthritis of left wrist    STT and 1st Parsons jt   Osteoporosis 05/2010   2012 'penia; 06/2015 'porosis:  prolia.  DEXA 09/2017 T-score -2.2 femur neck. 11/2021 T score -2.2. Rpt 2 yrs   Pelvic floor dysfunction  [facility] ([name])   Pneumonia    Postmenopausal vaginal bleeding 05/2017   x 1 small episode; GYN attempted endo bx but unable to penetrate cervix due to severe cerv stenosis (due to postmenopausal state + hx of LEEP).  Endo u/s showed thin endo lining.  Per GYN---no evidence of endomet pathology on w/u---obs as of 07/2017.   Prediabetes 06/2016   Fasting gluc 105; HbA1c at that time was 6.0%.  A1c 6.2% 12/2017.  A1c 5.7% Feb 2023.   Recurrent kidney stones 08/2013   Left ureteral calculus: Perc nephr--cystoscopy w/ureteroscopy + laser for stone removal.  Residual asymptomatic left renal nephrolithiasis <15m present post-procedure.  Right sided hydronephrosis-persistent (on u/s)--urol ordered CT to further eval 08/2016: 1.6 cm nonobstructing stone lower  pole left kidney, no hydro, plan for PCN extraction ([facility])   Shortness of breath    Sprain of neck 12/19/2013   Stroke (HCC)    TIA   TMJ (temporomandibular joint disorder)    USES  MOUTH GUARD   Toe fracture, left 12/2017   2nd toe prox phalanx (immobilization, Dr. RZadie Rhine   Tubular adenoma of colon    Vitamin D deficiency 05/2011   Weak urinary stream 07/2016   with elevated PVR and mild left hydronephrosis secondary to this ([facility] started flomax 0.'4mg'$  qd for this).   Past Surgical History:  Procedure Laterality Date   ABIs  11/29/2019   NORMAL   ANTERIOR CERVICAL DECOMP/DISCECTOMY FUSION  06/30/2009   C3 -- C5  AND EXPLORATION OF FUSION C5-7 W/  PLATE REMOVAL   ARTHRODESIS METATARSALPHALANGEAL JOINT (MTPJ) Left 07/08/2021   Procedure: Left hallux metatarsophalangeal joint arthrodesis;  Surgeon: HWylene Simmer MD;  [place] [facility]  Service: Orthopedics;  Laterality: Left;   BACK SURGERY     BUNIONECTOMY Left    CARDIOVASCULAR STRESS TEST  12/03/2019   myoc perf im: NORMAL   CERVICAL FUSION  2002   anterior C5 -- C7   CERVICAL SPINE SURGERY  08/27/1999     C6 -- T1  LAMINECTOMY/  DISKECTOMY   CESAREAN SECTION  1994   CHOLECYSTECTOMY N/A 03/06/2020   Procedure: LAPAROSCOPIC CHOLECYSTECTOMY;  Surgeon: SFelicie Morn MD;  [place] [facility]  Service: General;  Laterality: N/A;   COLONOSCOPY W/ POLYPECTOMY  04/2008;08/2014   2016 tubular adenoma x 1; +diverticulosis and int/ext hemorrhoids.  06/2020 adenoma, recall 5 yrs.   CYSTOSCOPY WITH URETEROSCOPY AND STENT PLACEMENT Left 08/28/2013   Procedure: CYSTOSCOPY, RGP,  WITH URETEROSCOPY AND STENT REMOVAL;  Surgeon: DBernestine Amass MD;  [facility]  Service: Urology;  Laterality: Left;   DEXA  09/2017   T-score -2.2 femur neck: improved compared to 06/2015.  2021 DEXA T score -2.3.  11/2021 DEXA T score -2.2.   ESOPHAGOGASTRODUODENOSCOPY  2010; 08/2014   2016 +Candidal  esophagitis; Mild chronic gastritis w/intestinal metaplasia--NEG H pylori, neg for eosinophilic esoph. 38/1448esoph candidiasis (diflucan x 20d), o/w normal.   HAMMER TOE SURGERY Left 07/08/2021   Procedure: Second metatarsal head excision; revision Second hammertoe correction; 2-5 percutaneus flexor tenotomies;  Surgeon: HWylene Simmer MD;  Location: [facility]  Service: Orthopedics;  Laterality: Left;   HARDWARE REMOVAL Left 07/08/2021   Procedure: Removal of deep implants from hallux proximal phalanx and First and Second metatarsals;  Surgeon: HWylene Simmer MD;  [place] [facility]  Service: Orthopedics;  Laterality: Left;   HOLMIUM LASER APPLICATION Left 018/56/3149  Procedure: HOLMIUM LASER APPLICATION;  Surgeon: [name], MD;  Location: [facility];  Service: Urology;  Laterality: Left;   JOINT REPLACEMENT     LEFT HEART CATH AND CORONARY ANGIOGRAPHY N/A 05/15/2018   Evidence for spasm in distal LAD.  Mod RCA atherosclerosis, o/w no CAD.  Procedure: LEFT HEART CATH AND CORONARY ANGIOGRAPHY;  Surgeon: Leonie Man, MD;  Location: [facility] [place] [facility];  Service: Cardiovascular;  Laterality: N/A;   NEGATIVE SLEEP STUDY  04/01/2007   NEPHROLITHOTOMY Left 08/05/2013   Procedure: NEPHROLITHOTOMY PERCUTANEOUS;  Surgeon: [name], MD;  Location: [facility];  Service: Urology;  Laterality: Left;   POLYSOMNOGRAM  01/2019   NORMAL   POLYSOMNOGRAPHY  10/25/2018   Dr. Dion Saucier due to pt inadequat sleep time (83 min).   TONSILLECTOMY AND ADENOIDECTOMY  1975   TOTAL KNEE ARTHROPLASTY Right 2003   TRANSTHORACIC ECHOCARDIOGRAM  05/2018; 12/03/2019   (after MI secondary to arterial spasm): EF 45-50%; severe hypokinesis of apical anterolateral, inferolateral , anterior, and inferior LV myocardium. 11/2019 EF 55-60%, valves fine   ULTRASOUND EXAM,PELVIC COMPLETE ([place] [facility])  06/25/2015   NORMAL (done by GYN)   Patient Active Problem  List   Diagnosis Date Noted   Shortness of breath 04/19/2019   Healthcare maintenance 04/19/2019   Overweight (BMI 25.0-29.9) 02/19/2019   Ischemic cardiomyopathy 09/03/2018   Chest pain 07/11/2018   Coronary vasospasm ([place]) 07/11/2018   NSTEMI (non-ST elevated myocardial infarction) ([place]) 05/14/2018   Acute encephalopathy 11/19/2017   Hyponatremia 11/16/2017   Bipolar 1 disorder ([place]) 12/29/2016   Hypertension 12/29/2016   TIA (transient ischemic attack) 12/29/2016   Chronic pelvic pain in female 08/22/2016   Stage 2 chronic kidney disease 08/22/2016   Osteoporosis 09/30/2015   Health maintenance examination 06/11/2015   Suprapubic discomfort 06/05/2015   Urethral caruncle 06/05/2015   Infection of urinary tract 06/05/2015   TMJ (temporomandibular joint disorder) 02/25/2015   Right ankle injury 11/05/2014   Cervical spondylosis without myelopathy 12/19/2013   Sprain of neck 12/19/2013   Memory difficulties 12/19/2013   Postnasal drip 10/28/2013   Renal calculus 08/05/2013   Urinary frequency 08/01/2013   Constipation, chronic 07/29/2013   COPD (chronic obstructive pulmonary disease) ([place]) 06/30/2013   Abnormal chest x-ray 06/30/2013   Benign essential HTN 05/29/2013   Kidney stone on left side 05/29/2013   Hyperlipidemia 05/29/2013    PCP: Tammi Sou, MD  REFERRING PROVIDER:  foot: Corky Sing, PA-C     Neck: Phlip McGowen  REFERRING DIAG: M20.22 (ICD-10-CM) - Hallux rigidus, left foot  Procedure(s): 1.  Left first metatarsal removal of deep implant 2.  Left hallux proximal phalanx removal of deep implant 3.  Left hallux metatarsophalangeal joint arthrodesis 4.  Removal of deep implant from the left second metatarsal 5.  Left second metatarsal head excision 6.  Left second hammertoe correction 7.  Left third, fourth and fifth toe flexor digitorum longus tenotomies 8.  AP, lateral and oblique radiographs of the left foot  THERAPY DIAG:  Stiffness  of left foot, not elsewhere classified  Pain in left toe(s)  Difficulty walking  Cervicalgia  Rationale for Evaluation and Treatment Rehabilitation  ONSET DATE: 07/08/2021 Surgery  SUBJECTIVE:   SUBJECTIVE STATEMENT: 11/09/2021 States her foot is about 80% better, states her neck is the same ol' thing and doesn't know if y'all could fix my neck. States that she would like to work on her neck today though. States her right knee is also bothering her and her  fingers hurt.    Eval: Pt states that she has L foot pain and swelling since having surgery. Pain is the top of the foot and lateral. Pt has pain all the time walking, big toe mostly with walking. Putting her shoe on it hurts. She states her current shoes are too small and old. Pt has pain with any walking. Pushing off on the foot hurts. Pt denies red flag symptoms. Pt states she mainly sedentary due to the foot pain.   PERTINENT HISTORY: C/S surgery, Stroke, TIA, Hallux deformity+adhesive capsulitis+ hammertoe:  Severe structural bunion deformity with hallux interphalangeus and severe arthritis of the second MPJ left foot--surgical repair/osteotomies by [name] 04/2017.  PAIN:  Are you having pain? Yes: NPRS scale: 3/10 Pain location: L big toe Pain description: throbbing, sharp Aggravating factors: any movement Relieving factors: resting   Are you having pain? Yes: NPRS scale: 5/10 Pain location: L  neck, Radiating into L side of head Pain description: numb, painful Aggravating factors:  Relieving factors:   PRECAUTIONS: None  WEIGHT BEARING RESTRICTIONS No  FALLS:  Has patient fallen in last 6 months? No  LIVING ENVIRONMENT: Lives with: lives alone Lives in: House/apartment Stairs: 1st level Has following equipment at home: None  OCCUPATION: N/A  PLOF: Independent  PATIENT GOALS : Pt would like to get more mobile and improve walking.    OBJECTIVE:   DIAGNOSTIC FINDINGS: N/A   COGNITION:  Overall  cognitive status: Within functional limits for tasks assessed     SENSATION: Light touch: Impaired   EDEMA:  Moderate edema between 2nd and 3rd digit, non pitting   POSTURE: increased thoracic kyphosis, flexed trunk , and weight shift right, pes planus  PALPATION: TTP dorsum of lateral foot along 3-5th rays, great toe plantar surface TTP and joint mobilization into ext  10/26/21: Tenderness/tightness in L UT, L cervical paraspinals, Sub occipitals,   LOWER EXTREMITY ROM:  MMT Right eval Left eval  Ankle dorsiflexion 10 3  Ankle plantarflexion [facility]  Ankle inversion 25 20  Ankle eversion 20 10   (Blank rows = not tested)  Great toe extension on L: 5 deg  10/26/21:   Cervical ROM:  L rot: 35,  R rot 60, Flex: mod limitation, Ext: mod/significant limitaiton.     LOWER EXTREMITY MMT:  Passive ROM Right eval Left eval  Ankle dorsiflexion 4+/5 4/5 p!  Ankle plantarflexion 4+/5 4/5 p!  Ankle inversion 4+/5 4-/5 p!  Ankle eversion 4+/5 4-/5 p!   (Blank rows = not tested)   FUNCTIONAL TESTS:    GAIT: Distance walked: 53f Assistive device utilized: None Level of assistance: Complete Independence Comments: decreased toe off on L, early heel off, lack of distinct heel strike; pain throughout   TODAY'S TREATMENT: 11/09/2021 Therapeutic Exercise: Aerobic: Supine: scap protraction 2 minutes, neck rotation - dizzy stopped. Shoulder flexion AAROM 2 minutes  Seated:   sit to stand x10;  shoulder ER x10 5" holds B Standing:  Scap squeeze x 10,  shoulder flexion at wall x20 B, Stretches:  Neuromuscular Re-education: Manual Therapy: STM and IASTM with percussion gun for vibration to L UT, levator, paraspinals Self Care:    Previous:  Therapeutic Exercise: Aerobic: Supine:  Seated: Great toe flexion to floor,  YTB x 15;  sit to stand x10;  Cervical rotation x 5 bil;  Standing:  shoulder rolls x 10; scap squeeze x 10,  Stretches:  Neuromuscular Re-education: Manual  Therapy: STM to L UT, levator, paraspinals, SOR,  Self Care:    PATIENT EDUCATION:  Education details: reviewed and updated HEP, on anatomy, on rationale for interventions, on current condition Person educated: Patient Education method: Explanation, Demonstration, Tactile cues, Verbal cues, and Handouts Education comprehension: verbalized understanding, returned demonstration, verbal cues required, and tactile cues required   HOME EXERCISE PROGRAM:  Access Code: [email or web address]   ASSESSMENT:  CLINICAL IMPRESSION: 11/09/2021 Session focused on education and review of HEP. Poor tolerance to supine exercises secondary to dizziness. Patient with uncertainty with new interventions, explained rationale for interventions as well as pathology of current condition. Minimal change in symptoms noted end of session.    OBJECTIVE IMPAIRMENTS Abnormal gait, decreased activity tolerance, decreased balance, decreased coordination, decreased endurance, decreased knowledge of condition, decreased mobility, difficulty walking, decreased ROM, decreased strength, hypomobility, impaired flexibility, impaired sensation, improper body mechanics, postural dysfunction, and pain.   ACTIVITY LIMITATIONS carrying, lifting, standing, squatting, stairs, transfers, and locomotion level  PARTICIPATION LIMITATIONS: meal prep, cleaning, laundry, interpersonal relationship, driving, shopping, community activity, yard work, and exercise  PERSONAL FACTORS Age, Behavior pattern, Fitness, Past/current experiences, Time since onset of injury/illness/exacerbation, and 3+ comorbidities:    are also affecting patient's functional outcome.   REHAB POTENTIAL: Fair    CLINICAL DECISION MAKING: Evolving/moderate complexity  EVALUATION COMPLEXITY: Moderate   GOALS:   SHORT TERM GOALS: Target date: 11/04/2021   Pt will become independent with HEP in order to demonstrate synthesis of PT education.   Goal status: INITIAL  2.  Pt  will report at least 2 pt reduction on NPRS scale for pain in order to demonstrate functional improvement with household activity, self care, and ADL.   Goal status: INITIAL  3.  Pt will score at least 19 pt increase on FOTO to demonstrate functional improvement in MCII and pt perceived function.    Goal status: INITIAL    LONG TERM GOALS: Target date: 12/16/2021   Pt  will become independent with final HEP in order to demonstrate synthesis of PT education.   Goal status: INITIAL  2.  Pt will score >/= 41 on FOTO to demonstrate improvement in perceived L foot function.   Goal status: INITIAL  3.  Pt will be able to demonstrate/report ability to walk >15 mins without pain in order to demonstrate functional improvement and tolerance to exercise and community mobility.   Goal status: INITIAL  4.  Pt will be able to perform 5XSTS in under 12s  in order to demonstrate functional improvement above the cut off score for adults.   Goal status: INITIAL    5.  Pt to report decreased pain in neck, to 0-2/10 with activity, sleeping, and neck ROM.     Goal status: INITIAL/added 10/26/21       PLAN: PT FREQUENCY: 1-2x/week  PT DURATION: 12 weeks  PLANNED INTERVENTIONS: Therapeutic exercises, Therapeutic activity, Neuromuscular re-education, Balance training, Gait training, Patient/Family education, Joint manipulation, Joint mobilization, Stair training, Orthotic/Fit training, DME instructions, Aquatic Therapy, Dry Needling, Electrical stimulation, Spinal manipulation, Spinal mobilization, Moist heat, scar mobilization, Splintting, Taping, Vasopneumatic device, Traction, Ultrasound, Ionotophoresis '4mg'$ /ml Dexamethasone, Manual therapy, and Re-evaluation  PLAN FOR NEXT SESSION: standing balance, gait, LE strength, manual for neck pain, postural strength    10:59 AM, 11/09/21 Jerene Pitch, DPT Physical Therapy with [facility]

## 2021-11-09 NOTE — Telephone Encounter (Signed)
Patient returning call regarding bone scan results. Read to patient Dr. Idelle Leech results of scan. Copied and pasted below: Bone density test showed that her bones are the same thickness as 2021. Continue calcium and vitamin D. Plan repeat bone density 2 years.  Patient understood and no follow up needed at this time.

## 2021-11-10 ENCOUNTER — Ambulatory Visit (INDEPENDENT_AMBULATORY_CARE_PROVIDER_SITE_OTHER): Payer: Medicare Other | Admitting: Obstetrics & Gynecology

## 2021-11-10 ENCOUNTER — Encounter: Payer: Self-pay | Admitting: Obstetrics & Gynecology

## 2021-11-10 ENCOUNTER — Other Ambulatory Visit (HOSPITAL_COMMUNITY)
Admission: RE | Admit: 2021-11-10 | Discharge: 2021-11-10 | Disposition: A | Discharge status: Home / Self Care | Payer: Medicare Other | Source: Ambulatory Visit | Attending: Obstetrics & Gynecology | Admitting: Obstetrics & Gynecology

## 2021-11-10 VITALS — BP 122/82 | HR 80 | Ht 65.75 in | Wt 183.0 lb

## 2021-11-10 DIAGNOSIS — N950 Postmenopausal bleeding: Secondary | ICD-10-CM

## 2021-11-10 DIAGNOSIS — M8589 Other specified disorders of bone density and structure, multiple sites: Secondary | ICD-10-CM | POA: Diagnosis not present

## 2021-11-10 DIAGNOSIS — Z780 Asymptomatic menopausal state: Secondary | ICD-10-CM

## 2021-11-10 DIAGNOSIS — Z01419 Encounter for gynecological examination (general) (routine) without abnormal findings: Secondary | ICD-10-CM

## 2021-11-10 NOTE — Progress Notes (Signed)
Erin Lopez November 09, 1950 [id number]   History:    71 y.o. G3P1A2L1 Single   RP:  Established patient presenting for annual gyn exam    HPI: Postmenopause, well on no HRT.  Very mild PMB, spotting, for 3 weeks.  No pelvic pain.  Abstinent.  Pap Neg 08/2019.  Pap reflex today.  Breasts wnl.  Mammo Neg 08/2021.  Urine/BMs normal.  Colono 06/2020.  BMI 29.76.  Physically active. Bone Density at [facility] Osteopenia T-Score -2.2 at the Rt Femoral Neck.  Bipolar disorder, well currently on Celexa and Lamictal.  Health labs Fam MD.  Past medical history,surgical history, family history and social history were all reviewed and documented in the EPIC chart.  Gynecologic History No LMP recorded. Patient is postmenopausal.  Obstetric History OB History  Gravida Para Term Preterm AB Living  3 1 0 '1 2 1  '$ SAB IAB Ectopic Multiple Live Births  2            # Outcome Date GA Lbr Len/2nd Weight Sex Delivery Anes PTL Lv  3 Preterm           2 SAB           1 SAB              ROS: A ROS was performed and pertinent positives and negatives are included in the history. GENERAL: No fevers or chills. HEENT: No change in vision, no earache, sore throat or sinus congestion. NECK: No pain or stiffness. CARDIOVASCULAR: No chest pain or pressure. No palpitations. PULMONARY: No shortness of breath, cough or wheeze. GASTROINTESTINAL: No abdominal pain, nausea, vomiting or diarrhea, melena or bright red blood per rectum. GENITOURINARY: No urinary frequency, urgency, hesitancy or dysuria. MUSCULOSKELETAL: No joint or muscle pain, no back pain, no recent trauma. DERMATOLOGIC: No rash, no itching, no lesions. ENDOCRINE: No polyuria, polydipsia, no heat or cold intolerance. No recent change in weight. HEMATOLOGICAL: No anemia or easy bruising or bleeding. NEUROLOGIC: No headache, seizures, numbness, tingling or weakness. PSYCHIATRIC: No depression, no loss of interest in normal activity or change in sleep pattern.      Exam:   BP 122/82   Pulse 80   Ht 5' 5.75" (1.67 m)   Wt 183 lb (83 kg)   SpO2 95%   BMI 29.76 kg/m   Body mass index is 29.76 kg/m.  General appearance : Well developed well nourished female. No acute distress HEENT: Eyes: no retinal hemorrhage or exudates,  Neck supple, trachea midline, no carotid bruits, no thyroidmegaly Lungs: Clear to auscultation, no rhonchi or wheezes, or rib retractions  Heart: Regular rate and rhythm, no murmurs or gallops Breast:Examined in sitting and supine position were symmetrical in appearance, no palpable masses or tenderness,  no skin retraction, no nipple inversion, no nipple discharge, no skin discoloration, no axillary or supraclavicular lymphadenopathy Abdomen: no palpable masses or tenderness, no rebound or guarding Extremities: no edema or skin discoloration or tenderness  Pelvic: Vulva: Normal             Vagina: No gross lesions.    Cervix: No gross lesions. Mild yellow/brown discharge.  No frank blood.  Pap reflex done.  Uterus  AV, normal size, shape and consistency, non-tender and mobile  Adnexa  Without masses or tenderness  Anus: Normal   Assessment/Plan:  71 y.o. female for annual exam   1. Encounter for routine gynecological examination with Papanicolaou smear of cervix Postmenopause, well on no HRT.  Very mild  PMB, spotting, for 3 weeks.  No pelvic pain.  Abstinent.  Pap Neg 08/2019.  Pap reflex today.  Breasts wnl.  Mammo Neg 08/2021.  Urine/BMs normal. Colono 06/2020.  BMI 29.76.  Physically active. Bone Density at [facility] Osteopenia T-Score -2.2 at the Rt Femoral Neck.  Bipolar disorder, well currently on Celexa and Lamictal.  Health labs Fam MD. - Cytology - PAP( [place])  2. Postmenopause Postmenopause, well on no HRT.  Very mild PMB, spotting, for 3 weeks.  No pelvic pain.  Abstinent.   3. Postmenopausal bleeding Postmenopause, well on no HRT.  Very mild PMB, spotting, for 3 weeks.  No pelvic pain.  Abstinent.   Pap Neg 08/2019.  Pap reflex today.  Will f/u for a Pelvic US to further investigate.  Sonohysto/EBx per findings. - US Transvaginal Non-OB; Future  4. Osteopenia of multiple sites  Bone Density at [facility] Osteopenia T-Score -2.2 at the Rt Femoral Neck. Vit D, Mg++, Ca++ in food, regular weight bearing activities.  Princess Bruins MD, 1:49 PM 11/10/2021

## 2021-11-11 LAB — CYTOLOGY - PAP: Diagnosis: NEGATIVE

## 2021-11-11 NOTE — Telephone Encounter (Signed)
Okay. Increase losartan to 50 mg a day and increase Risperdal to 0.5 mg twice a day. Losartan '50mg'$ , 1 tab po qd, #90, RF x 0. Risperdal 0.'5mg'$ , 1 bid, #60, RF x 0.

## 2021-11-11 NOTE — Telephone Encounter (Signed)
Pt stated she could not tolerate '25mg'$  dose of HCTZ due to increased urination so she has been cutting the pills in half for 4 days now. She does not feel much change with Risperdal 0.'5mg'$  after 1 week of taking. Mood is still fluctuating. Next appt scheduled 12/02/21   Please review and advise

## 2021-11-12 MED ORDER — LOSARTAN POTASSIUM 50 MG PO TABS: 50.0000 mg | ORAL_TABLET | Freq: Every day | ORAL | 0 refills | Status: DC

## 2021-11-12 MED ORDER — RISPERIDONE 0.5 MG PO TABS: ORAL_TABLET | ORAL | 0 refills | Status: DC

## 2021-11-12 NOTE — Telephone Encounter (Signed)
Pt advised of medication recommendations. New rx sent in

## 2021-11-19 DIAGNOSIS — H2512 Age-related nuclear cataract, left eye: Secondary | ICD-10-CM | POA: Diagnosis not present

## 2021-11-19 DIAGNOSIS — H2511 Age-related nuclear cataract, right eye: Secondary | ICD-10-CM | POA: Diagnosis not present

## 2021-11-22 ENCOUNTER — Telehealth: Payer: Self-pay | Admitting: Cardiology

## 2021-11-22 ENCOUNTER — Emergency Department (HOSPITAL_BASED_OUTPATIENT_CLINIC_OR_DEPARTMENT_OTHER)
Admission: EM | Admit: 2021-11-22 | Discharge: 2021-11-22 | Disposition: A | Discharge status: Home / Self Care | Payer: Medicare Other | Attending: Emergency Medicine | Admitting: Emergency Medicine

## 2021-11-22 ENCOUNTER — Telehealth: Payer: Self-pay

## 2021-11-22 ENCOUNTER — Encounter (HOSPITAL_BASED_OUTPATIENT_CLINIC_OR_DEPARTMENT_OTHER): Payer: Self-pay | Admitting: Radiology

## 2021-11-22 ENCOUNTER — Other Ambulatory Visit: Payer: Self-pay

## 2021-11-22 ENCOUNTER — Emergency Department (HOSPITAL_BASED_OUTPATIENT_CLINIC_OR_DEPARTMENT_OTHER): Payer: Medicare Other

## 2021-11-22 DIAGNOSIS — J90 Pleural effusion, not elsewhere classified: Secondary | ICD-10-CM

## 2021-11-22 DIAGNOSIS — I3139 Other pericardial effusion (noninflammatory): Secondary | ICD-10-CM | POA: Diagnosis not present

## 2021-11-22 DIAGNOSIS — Z87442 Personal history of urinary calculi: Secondary | ICD-10-CM | POA: Diagnosis not present

## 2021-11-22 DIAGNOSIS — R7989 Other specified abnormal findings of blood chemistry: Secondary | ICD-10-CM | POA: Diagnosis not present

## 2021-11-22 DIAGNOSIS — I10 Essential (primary) hypertension: Secondary | ICD-10-CM | POA: Diagnosis not present

## 2021-11-22 DIAGNOSIS — I700 Atherosclerosis of aorta: Secondary | ICD-10-CM | POA: Diagnosis not present

## 2021-11-22 DIAGNOSIS — R0602 Shortness of breath: Secondary | ICD-10-CM | POA: Insufficient documentation

## 2021-11-22 DIAGNOSIS — M542 Cervicalgia: Secondary | ICD-10-CM | POA: Insufficient documentation

## 2021-11-22 DIAGNOSIS — E876 Hypokalemia: Secondary | ICD-10-CM | POA: Insufficient documentation

## 2021-11-22 DIAGNOSIS — Z87891 Personal history of nicotine dependence: Secondary | ICD-10-CM | POA: Insufficient documentation

## 2021-11-22 DIAGNOSIS — Z79899 Other long term (current) drug therapy: Secondary | ICD-10-CM | POA: Diagnosis not present

## 2021-11-22 DIAGNOSIS — Z7982 Long term (current) use of aspirin: Secondary | ICD-10-CM | POA: Insufficient documentation

## 2021-11-22 DIAGNOSIS — J449 Chronic obstructive pulmonary disease, unspecified: Secondary | ICD-10-CM | POA: Diagnosis not present

## 2021-11-22 DIAGNOSIS — J439 Emphysema, unspecified: Secondary | ICD-10-CM | POA: Diagnosis not present

## 2021-11-22 LAB — BASIC METABOLIC PANEL
Anion gap: 11 (ref 5–15)
BUN: 12 mg/dL (ref 8–23)
CO2: 26 mmol/L (ref 22–32)
Calcium: 9.8 mg/dL (ref 8.9–10.3)
Chloride: 100 mmol/L (ref 98–111)
Creatinine, Ser: 0.82 mg/dL (ref 0.44–1.00)
GFR, Estimated: >60 mL/min (ref >60)
Glucose, Bld: 160 mg/dL — ABNORMAL HIGH (ref 70–99)
Potassium: 3.2 mmol/L — ABNORMAL LOW (ref 3.5–5.1)
Sodium: 137 mmol/L (ref 135–145)

## 2021-11-22 LAB — CBC
HCT: 33.8 % — ABNORMAL LOW (ref 36.0–46.0)
Hemoglobin: 11.8 g/dL — ABNORMAL LOW (ref 12.0–15.0)
MCH: 30.8 pg (ref 26.0–34.0)
MCHC: 34.9 g/dL (ref 30.0–36.0)
MCV: 88.3 fL (ref 80.0–100.0)
Platelets: 229 10*3/uL (ref 150–400)
RBC: 3.83 MIL/uL — ABNORMAL LOW (ref 3.87–5.11)
RDW: 12.1 % (ref 11.5–15.5)
WBC: 6.4 10*3/uL (ref 4.0–10.5)
nRBC: 0 % (ref 0.0–0.2)

## 2021-11-22 LAB — TROPONIN I (HIGH SENSITIVITY): Troponin I (High Sensitivity): 3 ng/L (ref <18)

## 2021-11-22 LAB — URINALYSIS, ROUTINE W REFLEX MICROSCOPIC
Bilirubin Urine: NEGATIVE
Glucose, UA: NEGATIVE mg/dL
Hgb urine dipstick: NEGATIVE
Ketones, ur: NEGATIVE mg/dL
Leukocytes,Ua: NEGATIVE
Nitrite: NEGATIVE
Protein, ur: NEGATIVE mg/dL
Specific Gravity, Urine: 1.01 (ref 1.005–1.030)
pH: 7 (ref 5.0–8.0)

## 2021-11-22 LAB — BRAIN NATRIURETIC PEPTIDE: B Natriuretic Peptide: 19.4 pg/mL (ref 0.0–100.0)

## 2021-11-22 LAB — D-DIMER, QUANTITATIVE: D-Dimer, Quant: 0.55 ug/mL-FEU — ABNORMAL HIGH (ref 0.00–0.50)

## 2021-11-22 MED ORDER — IOHEXOL 350 MG/ML SOLN
100.0000 mL | Freq: Once | INTRAVENOUS | Status: AC | PRN
  Administered 2021-11-22: 75 mL via INTRAVENOUS

## 2021-11-22 NOTE — Telephone Encounter (Signed)
Patient call sent straight to triage. Patient complaining of lightheadedness, SOB, swollen abdomen, elevated HR and BP. Patient thinks she might be in A. FIB. Asked patient if she had history of A. FIB, and she stated no that her brother had it.  Encouraged patient to go to the ED to get evaluated. Patient also started discussing her latest medication change for her bipolar disorder. Informed patient to call her PCP about those medications. Patient verbalized understanding. Patient stated she was going to go to the ED to get checked out.

## 2021-11-22 NOTE — ED Triage Notes (Signed)
Patient arrives with complaints of hypertension (systolic in the 629'U), shortness of breath x3 days, and throbbing headache.   Rates pain a 3/10.  Patient had cataract surgery 3 days ago.

## 2021-11-22 NOTE — ED Provider Notes (Signed)
[facility] EMERGENCY DEPT Provider Note  CSN: [id number] Arrival date & time: 11/22/21 1412  Chief Complaint(s) Hypertension and Shortness of Breath  HPI Erin Lopez is a 71 y.o. female with history of bipolar disorder, COPD, hypertension, hyperlipidemia, coronary artery spasm presenting to the emergency department with shortness of breath.  Patient reports shortness of breath for 3 days.  Reports her symptoms are mild.  Denies any fevers, chills, cough, leg swelling, runny nose, sore throat.  She denies chest pain.  She also reports some left right neck pain which she has had chronically and goes to physical therapy for.  She denies headache although this is mentioned in triage note.  Patient reports that she noticed her heart rate was high at home and was worried so she came in to get evaluated for her high heart rate.  According to triage line that patient called, who left a note, the patient was complaining of swollen abdomen, patient denies this to me or denies any abdominal pain.  Reports compliance with her medications.  Past Medical History Past Medical History:  Diagnosis Date   Alcoholism in remission [facility])    states last alcohol 11 yrs ago   Anemia    Anxiety    Bipolar 1 disorder ([place])    Admission for mania x 2 (most recent 11/2017)   Cervical spondylosis without myelopathy 12/19/2013   MRI 02/2014: multilevel DDD/spondylosis, not much change compared to prior MRI.  Pt not in favor of invasive therapy for her neck as of 04/2014.   Cholelithiasis    COPD (chronic obstructive pulmonary disease) (HCC)    Moderate: anoro started 04/2017 by pulm, pt symptomatically improved and PFTs stable at f/u 06/2017.   Coronary artery spasm ([place]) 07/11/2018   nitrates=HA. Amlodipine=intol swelling.  Changed to coreg 09/03/18 by cardiologist.   Depression    Dilutional hyponatremia    Endometritis 05/2017   possible; empiric tx with Flagyl by GYN.   Environmental allergies     Fibromyalgia    GERD (gastroesophageal reflux disease)    Hepatic steatosis 03/2019   u/s abd   Herpes zoster 08/07/2017   L scapular region   Hiatal hernia    History of adenomatous polyp of colon 04/2008; 08/2014   No high grade dysplasia: recall 5 yrs ([name], [name])   History of basal cell carcinoma excision    NOSE   History of Helicobacter pylori infection 04/2008   +gastric biopsy (gastritis but no metaplasia, dysplasia, or malignancy identified)   History of kidney stones    History of TIA (transient ischemic attack)    secondary to HELLP syndrome 1994 post c/s--  residual memory loss   Hyperlipidemia    statin started after her NSTEMI: goal  LDL <70.   Hypertension    Internal hemorrhoids    Ischemic cardiomyopathy 05/2018   Echo EF 45-50% in context of NSTEMI from CA spasm. 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 04/2017.   Leg pain    ABIs 11/2019: normal    Memory loss    Abnl MRI brain and CT brain c/w chronic microvascular ischemia.  Repeat MRI brain 02/2014 stable (Dr. Mayra Reel with no plan to f/u with neuro as of 04/2014)   NSTEMI (non-ST elevated myocardial infarction) ([place])  05/2018   Echo EF 45-50% in context of NSTEMI from CA spasm.// Myoview 8/21: EF 66, no ischemia, low risk   Osteoarthritis of left wrist    STT and 1st [place] jt   Osteoporosis 05/2010   2012 'penia; 06/2015 'porosis:  prolia.  DEXA 09/2017 T-score -2.2 femur neck. 11/2021 T score -2.2. Rpt 2 yrs   Pelvic floor dysfunction    [facility] ([name])   Pneumonia    Postmenopausal vaginal bleeding 05/2017   x 1 small episode; GYN attempted endo bx but unable to penetrate cervix due to severe cerv stenosis (due to postmenopausal state + hx  of LEEP).  Endo u/s showed thin endo lining.  Per GYN---no evidence of endomet pathology on w/u---obs as of 07/2017.   Prediabetes 06/2016   Fasting gluc 105; HbA1c at that time was 6.0%.  A1c 6.2% 12/2017.  A1c 5.7% Feb 2023.   Recurrent kidney stones 08/2013   Left ureteral calculus: Perc nephr--cystoscopy w/ureteroscopy + laser for stone removal.  Residual asymptomatic left renal nephrolithiasis <71m present post-procedure.  Right sided hydronephrosis-persistent (on u/s)--urol ordered CT to further eval 08/2016: 1.6 cm nonobstructing stone lower pole left kidney, no hydro, plan for PCN extraction ([facility])   Shortness of breath    Sprain of neck 12/19/2013   Stroke (HCC)    TIA   TMJ (temporomandibular joint disorder)    USES  MOUTH GUARD   Toe fracture, left 12/2017   2nd toe prox phalanx (immobilization, Dr. RZadie Rhine   Tubular adenoma of colon    Vitamin D deficiency 05/2011   Weak urinary stream 07/2016   with elevated PVR and mild left hydronephrosis secondary to this ([facility] started flomax 0.'4mg'$  qd for this).   Patient Active Problem List   Diagnosis Date Noted   Shortness of breath 04/19/2019   Healthcare maintenance 04/19/2019   Overweight (BMI 25.0-29.9) 02/19/2019   Ischemic cardiomyopathy 09/03/2018   Chest pain 07/11/2018   Coronary vasospasm ([place] 07/11/2018   [place]STEM[place] (non-ST elevated [place] infarction) [place] 05/14/2018   Acute encephalopathy 11/19/2017   Hyponatremia 11/16/2017   Bipolar 1 disorder ([place] 12/29/2016   Hypertension 12/29/2016   TIA (transient ischemic attack) 12/29/2016   Chronic pelvic pain in female 08/22/2016   Stage 2 chronic kidney disease 08/22/2016   Osteoporosis 09/30/2015   Health maintenance examination 06/11/2015   Suprapubic discomfort 06/05/2015   Urethral caruncle 06/05/2015   Infection of urinary tract 06/05/2015   TMJ (temporomandibular joint disorder) 02/25/2015   Right ankle injury 11/05/2014   Cervical spondylosis  without myelopathy 12/19/2013   Sprain of neck 12/19/2013   Memory difficulties 12/19/2013   Postnasal drip 10/28/2013   Renal calculus 08/05/2013   Urinary frequency 08/01/2013   Constipation, chronic 07/29/2013   COPD (chronic obstructive pulmonary disease) ([place] 06/30/2013   Abnormal chest x-ray 06/30/2013   Benign essential HTN 05/29/2013   Kidney stone on left side 05/29/2013   Hyperlipidemia 05/29/2013   Home Medication(s) Prior to Admission medications   Medication Sig Start Date End Date Taking? Authorizing Provider  acetaminophen (TYLENOL) 500 MG tablet Take 1,000 mg by mouth every 6 (six) hours as needed for mild pain or headache.     [provider]  albuterol (VENTOLIN HFA) 108 (90 Base) MCG/ACT inhaler INHALE 2 PUFFS INTO THE LUNGS EVERY 4 HOURS AS NEEDED FOR WHEEZE OR FOR SHORTNESS OF BREATH 07/09/20   McGowen, PAdrian Blackwater MD  ALPRAZolam (XANAX) 0.5 MG tablet TAKE 1 TABLET BY MOUTH THREE TIMES  A DAY AS NEEDED FOR ANXIETY 09/23/21   McGowen, Adrian Blackwater, MD  aspirin EC 81 MG EC tablet Take 1 tablet (81 mg total) by mouth daily. 05/17/18   Duke, Tami Lin, PA  atorvastatin (LIPITOR) 80 MG tablet TAKE 1 TABLET BY MOUTH DAILY AT 6 PM. 08/17/21   McGowen, Adrian Blackwater, MD  BESIVANCE 0.6 % SUSP Place 1 drop into the right eye 3 (three) times daily. 08/27/21   [provider]  citalopram (CELEXA) 20 MG tablet TAKE 1 TABLET (20 MG TOTAL) BY MOUTH IN THE MORNING AND AT BEDTIME. 11/08/21   McGowen, Adrian Blackwater, MD  cyclobenzaprine (FLEXERIL) 10 MG tablet TAKE 1 TABLET BY MOUTH TWICE A DAY 09/23/21   McGowen, Adrian Blackwater, MD  doxycycline (VIBRAMYCIN) 50 MG capsule Take 50 mg by mouth daily. 01/09/20   [provider]  famotidine (PEPCID) 40 MG tablet TAKE 1 TABLET BY MOUTH EVERY DAY 08/17/21   McGowen, Adrian Blackwater, MD  fluticasone (FLONASE) 50 MCG/ACT nasal spray SPRAY 2 SPRAYS INTO EACH NOSTRIL EVERY DAY 07/05/21   McGowen, Adrian Blackwater, MD  hydrochlorothiazide (HYDRODIURIL) 25 MG tablet  Take 1 tablet (25 mg total) by mouth daily. Patient taking differently: Take 12.5 mg by mouth daily. 11/01/21   McGowen, Adrian Blackwater, MD  ipratropium-albuterol (DUONEB) 0.5-2.5 (3) MG/3ML SOLN TAKE 3 MLS BY NEBULIZATION 3 (THREE) TIMES DAILY AS NEEDED. 07/13/20   Mannam, Hart Robinsons, MD  lamoTRIgine (LAMICTAL) 100 MG tablet Take 3 tablets (300 mg total) by mouth at bedtime. 05/06/21   McGowen, Adrian Blackwater, MD  losartan (COZAAR) 50 MG tablet Take 1 tablet (50 mg total) by mouth daily. 11/12/21   McGowen, Adrian Blackwater, MD  Magnesium 250 MG TABS Take 250 mg by mouth daily.     [provider]  meloxicam (MOBIC) 15 MG tablet TAKE 1 TABLET (15 MG TOTAL) BY MOUTH DAILY. 11/08/21   McGowen, Adrian Blackwater, MD  metoprolol succinate (TOPROL-XL) 100 MG 24 hr tablet Take 1 tablet (100 mg total) by mouth daily. Take with or immediately following a meal. 12/30/20   Turner, Eber Hong, MD  montelukast (SINGULAIR) 10 MG tablet TAKE 1 TABLET BY MOUTH EVERY DAY IN THE EVENING 06/22/21   McGowen, Adrian Blackwater, MD  Multiple Vitamin (MULTI-VITAMINS) TABS Take 1 tablet by mouth daily.     [provider]  nitroGLYCERIN (NITROSTAT) 0.4 MG SL tablet Place 1 tablet (0.4 mg total) under the tongue every 5 (five) minutes x 3 doses as needed for chest pain. Patient not taking: Reported on 11/10/2021 11/01/21   Tammi Sou, MD  oxyCODONE (OXY IR/ROXICODONE) 5 MG immediate release tablet 1 tab po q6h prn pain 11/01/21   McGowen, Adrian Blackwater, MD  pantoprazole (PROTONIX) 40 MG tablet TAKE 1 TABLET BY MOUTH EVERY DAY 08/25/21   Pyrtle, Lajuan Lines, MD  polyethylene glycol powder (GLYCOLAX/MIRALAX) powder TAKE 17 G BY MOUTH DAILY. 06/19/17   McGowen, Adrian Blackwater, MD  prednisoLONE acetate (PRED FORTE) 1 % ophthalmic suspension Place 1 drop into the right eye 4 (four) times daily. 08/27/21   [provider]  PROLENSA 0.07 % SOLN Place 1 drop into the right eye at bedtime. 08/27/21   [provider]  Propylene Glycol 0.6 % SOLN Apply 1 drop to eye  2 (two) times daily.    [provider]  risperiDONE (RISPERDAL) 0.5 MG tablet Take 2 tabs po qhs 11/12/21   McGowen, Adrian Blackwater, MD  tamsulosin (FLOMAX) 0.4 MG CAPS capsule Take 0.4 mg  by mouth daily. 08/16/20   [provider]  Tiotropium Bromide-Olodaterol (STIOLTO RESPIMAT) 2.5-2.5 MCG/ACT AERS INHALE 2 PUFFS BY MOUTH INTO THE LUNGS DAILY 05/17/21   Mannam, Praveen, MD  triamcinolone cream (KENALOG) 0.1 % Apply 1 application topically daily. 05/21/18   [provider]  zolpidem (AMBIEN) 10 MG tablet TAKE 1 TABLET BY MOUTH EVERYDAY AT BEDTIME 05/06/21   McGowen, Adrian Blackwater, MD                                                                                                                                    Past Surgical History Past Surgical History:  Procedure Laterality Date   ABIs  11/29/2019   NORMAL   ANTERIOR CERVICAL DECOMP/DISCECTOMY FUSION  06/30/2009   C3 -- C5  AND EXPLORATION OF FUSION C5-7 W/  PLATE REMOVAL   ARTHRODESIS METATARSALPHALANGEAL JOINT (MTPJ) Left 07/08/2021   Procedure: Left hallux metatarsophalangeal joint arthrodesis;  Surgeon: Wylene Simmer, MD;  Location: [facility];  Service: Orthopedics;  Laterality: Left;   BACK SURGERY     BUNIONECTOMY Left    CARDIOVASCULAR STRESS TEST  12/03/2019   myoc perf im: NORMAL   CERVICAL FUSION  2002   anterior C5 -- C7   CERVICAL SPINE SURGERY  08/27/1999     C6 -- T1  LAMINECTOMY/  DISKECTOMY   CESAREAN SECTION  1994   CHOLECYSTECTOMY N/A 03/06/2020   Procedure: LAPAROSCOPIC CHOLECYSTECTOMY;  Surgeon: Felicie Morn, MD;  Location: [facility];  Service: General;  Laterality: N/A;   COLONOSCOPY W/ POLYPECTOMY  04/2008;08/2014   2016 tubular adenoma x 1; +diverticulosis and int/ext hemorrhoids.  06/2020 adenoma, recall 5 yrs.   CYSTOSCOPY WITH URETEROSCOPY AND STENT PLACEMENT Left 08/28/2013   Procedure: CYSTOSCOPY, RGP,  WITH URETEROSCOPY AND STENT REMOVAL;  Surgeon: [name], MD;   Location: [facility];  Service: Urology;  Laterality: Left;   DEXA  09/2017   T-score -2.2 femur neck: improved compared to 06/2015.  2021 DEXA T score -2.3.  11/2021 DEXA T score -2.2.   ESOPHAGOGASTRODUODENOSCOPY  2010; 08/2014   2016 +Candidal esophagitis; Mild chronic gastritis w/intestinal metaplasia--NEG H pylori, neg for eosinophilic esoph. 07/9700 esoph candidiasis (diflucan x 20d), o/w normal.   HAMMER TOE SURGERY Left 07/08/2021   Procedure: Second metatarsal head excision; revision Second hammertoe correction; 2-5 percutaneus flexor tenotomies;  Surgeon: Wylene Simmer, MD;  Location: [facility];  Service: Orthopedics;  Laterality: Left;   HARDWARE REMOVAL Left 07/08/2021   Procedure: Removal of deep implants from hallux proximal phalanx and First and Second metatarsals;  Surgeon: Wylene Simmer, MD;  Location: [facility];  Service: Orthopedics;  Laterality: Left;   HOLMIUM LASER APPLICATION Left 63/78/5885   Procedure: HOLMIUM LASER APPLICATION;  Surgeon: [name], MD;  Location: [facility];  Service: Urology;  Laterality: Left;   JOINT REPLACEMENT     LEFT  HEART CATH AND CORONARY ANGIOGRAPHY N/A 05/15/2018   Evidence for spasm in distal LAD.  Mod RCA atherosclerosis, o/w no CAD.  Procedure: LEFT HEART CATH AND CORONARY ANGIOGRAPHY;  Surgeon: Leonie Man, MD;  Location: [facility];  Service: Cardiovascular;  Laterality: N/A;   NEGATIVE SLEEP STUDY  04/01/2007   NEPHROLITHOTOMY Left 08/05/2013   Procedure: NEPHROLITHOTOMY PERCUTANEOUS;  Surgeon: [name], MD;  Location: [facility];  Service: Urology;  Laterality: Left;   POLYSOMNOGRAM  01/2019   NORMAL   POLYSOMNOGRAPHY  10/25/2018   Dr. Dion Saucier due to pt inadequat sleep time (83 min).   TONSILLECTOMY AND ADENOIDECTOMY  1975   TOTAL KNEE ARTHROPLASTY Right 2003   TRANSTHORACIC ECHOCARDIOGRAM  05/2018; 12/03/2019   (after MI secondary to  arterial spasm): EF 45-50%; severe hypokinesis of apical anterolateral, inferolateral , anterior, and inferior LV myocardium. 11/2019 EF 55-60%, valves fine   ULTRASOUND EXAM,PELVIC COMPLETE ([place] [facility])  06/25/2015   NORMAL (done by GYN)   Family History Family History  Problem Relation Age of Onset   Alcohol abuse Mother    Arthritis Mother    Hypertension Mother    Hyperlipidemia Mother    Diabetes Mother    Parkinsonism Mother    Breast cancer Mother    Prostate cancer Father    Hypertension Father    Hyperlipidemia Father    Diabetes Father    Heart disease Father    Esophageal cancer Sister    Stomach cancer Sister    Colon polyps Sister    Colon polyps Brother    Heart disease Brother        x 2   Irritable bowel syndrome Sister    Colon cancer Neg Hx     Social History Social History   Tobacco Use   Smoking status: Former    Packs/day: 1.00    Years: 33.00    Total pack years: 33.00    Types: Cigarettes    Quit date: 08/22/1993    Years since quitting: 28.2   Smokeless tobacco: Never  Vaping Use   Vaping Use: Never used  Substance Use Topics   Alcohol use: No    Alcohol/week: 0.0 standard drinks of alcohol    Comment: ALCOHOLIC IN REMISSION SINCE  2004   Drug use: No   Allergies Ceftin [cefuroxime axetil], Ciprofloxacin, Fosamax [alendronate sodium], Morphine and related, Oxycodone, Prednisone, and Seroquel [quetiapine]  Review of Systems Review of Systems  All other systems reviewed and are negative.   Physical Exam Vital Signs  I have reviewed the triage vital signs BP (!) 162/88 (BP Location: Right Arm)   Pulse 96   Temp 97.7 F (36.5 C) (Oral)   Resp 16   Ht '5\' 6"'$  (1.676 m)   Wt 83 kg   SpO2 98%   BMI 29.54 kg/m  Physical Exam Vitals and nursing note reviewed.  Constitutional:      General: She is not in acute distress.    Appearance: She is well-developed.  HENT:     Head: Normocephalic and atraumatic.     Mouth/Throat:     Mouth:  Mucous membranes are moist.  Eyes:     Pupils: Pupils are equal, round, and reactive to light.  Cardiovascular:     Rate and Rhythm: Normal rate and regular rhythm.     Heart sounds: No murmur heard. Pulmonary:     Effort: Pulmonary effort is normal. No respiratory distress.     Breath sounds: Normal  breath sounds.  Abdominal:     General: Abdomen is flat.     Palpations: Abdomen is soft.     Tenderness: There is no abdominal tenderness.  Musculoskeletal:        General: No tenderness.     Right lower leg: No edema.     Left lower leg: No edema.  Skin:    General: Skin is warm and dry.  Neurological:     General: No focal deficit present.     Mental Status: She is alert. Mental status is at baseline.  Psychiatric:        Mood and Affect: Mood normal.        Behavior: Behavior normal.     ED Results and Treatments Labs (all labs ordered are listed, but only abnormal results are displayed) Labs Reviewed  BASIC METABOLIC PANEL - Abnormal; Notable for the following components:      Result Value   Potassium 3.2 (*)    Glucose, Bld 160 (*)    All other components within normal limits  CBC - Abnormal; Notable for the following components:   RBC 3.83 (*)    Hemoglobin 11.8 (*)    HCT 33.8 (*)    All other components within normal limits  URINALYSIS, ROUTINE W REFLEX MICROSCOPIC - Abnormal; Notable for the following components:   Color, Urine COLORLESS (*)    All other components within normal limits  D-DIMER, QUANTITATIVE  BRAIN NATRIURETIC PEPTIDE  TROPONIN I (HIGH SENSITIVITY)                                                                                                                          Radiology DG Chest 1 View  Result Date: 11/22/2021 CLINICAL DATA:  Shortness of breath EXAM: CHEST  1 VIEW COMPARISON:  12/09/2020 FINDINGS: Cardiac size is within normal limits. Lung fields are clear of any infiltrates or pulmonary edema. Slight prominence of interstitial  markings in the lower lung fields may suggest minimal scarring. There is no pleural effusion or pneumothorax. There is previous surgical fusion in cervical spine. IMPRESSION: There are no signs of pulmonary edema or new focal infiltrates. Electronically Signed   By: Elmer Picker M.D.   On: 11/22/2021 19:14    Pertinent labs & imaging results that were available during my care of the patient were reviewed by me and considered in my medical decision making (see MDM for details).  Medications Ordered in ED Medications - No data to display  Procedures .1-3 Lead EKG Interpretation  Performed by: Cristie Hem, MD Authorized by: Cristie Hem, MD     Interpretation: abnormal     ECG rate assessment: tachycardic     Rhythm: sinus tachycardia     Ectopy: none     Conduction: normal     (including critical care time)  Medical Decision Making / ED Course   MDM:  71 year old female presenting to the emergency department with shortness of breath.  Patient well-appearing, physical exam with clear lungs.  EKG with sinus tachycardia without acute ST or T wave changes concerning for ischemia.  Exam reassuring, no peripheral edema.  Abdomen soft.  Unclear cause of shortness of breath, chest x-ray clear without sign of pneumonia, pneumothorax.  Patient does not appear to be volume overloaded with peripheral edema or pulmonary edema.  No wheezing to suggest COPD.  Labs reassuring including no sign of severe anemia.  Will check D-dimer, troponin although low concern for ACS or PE.  Will reassess.  Patient also complained of headache reportedly to triage nurse but denies this currently to me, and complained of swollen abdomen reportedly on nursing triage line but on my exam has no abdominal distention or abdominal tenderness to suggest intra-abdominal  process.  Clinical Course as of 11/22/21 2356  Mon Nov 22, 2021  2355 Work-up reassuring, including negative CTA after D-dimer was positive.  Vitals reassuring. Will discharge patient to home. All questions answered. Patient comfortable with plan of discharge. Return precautions discussed with patient and specified on the after visit summary.  [WS]    Clinical Course User Index [WS] Cristie Hem, MD      Lab Tests: -I ordered, reviewed, and interpreted labs.   The pertinent results include:   Labs Reviewed  BASIC METABOLIC PANEL - Abnormal; Notable for the following components:      Result Value   Potassium 3.2 (*)    Glucose, Bld 160 (*)    All other components within normal limits  CBC - Abnormal; Notable for the following components:   RBC 3.83 (*)    Hemoglobin 11.8 (*)    HCT 33.8 (*)    All other components within normal limits  URINALYSIS, ROUTINE W REFLEX MICROSCOPIC - Abnormal; Notable for the following components:   Color, Urine COLORLESS (*)    All other components within normal limits  D-DIMER, QUANTITATIVE  BRAIN NATRIURETIC PEPTIDE  TROPONIN I (HIGH SENSITIVITY)      EKG   EKG Interpretation  Date/Time:  Monday November 22 2021 14:29:19 EDT Ventricular Rate:  117 PR Interval:  172 QRS Duration: 78 QT Interval:  336 QTC Calculation: 468 R Axis:   46 Text Interpretation: Sinus tachycardia Otherwise normal ECG When compared with ECG of 30-May-2018 15:13, PREVIOUS ECG IS PRESENT Confirmed by Garnette Gunner [phone]) on 11/22/2021 7:12:32 PM         Imaging Studies ordered: I ordered imaging studies including CTA chest, CXR I independently visualized and interpreted imaging. I agree with the radiologist interpretation   Medicines ordered and prescription drug management: No orders of the defined types were placed in this encounter.   -I have reviewed the patients home medicines and have made adjustments as needed    Cardiac  Monitoring: The patient was maintained on a cardiac monitor.  I personally viewed and interpreted the cardiac monitored which showed an underlying rhythm of: NSR  Social Determinants of Health:  Factors impacting patients care include: bipolar disorder   Reevaluation: After the  interventions noted above, I reevaluated the patient and found that they have :improved  Co morbidities that complicate the patient evaluation  Past Medical History:  Diagnosis Date   Alcoholism in remission [facility])    states last alcohol 11 yrs ago   Anemia    Anxiety    Bipolar 1 disorder ([place])    Admission for mania x 2 (most recent 11/2017)   Cervical spondylosis without myelopathy 12/19/2013   MRI 02/2014: multilevel DDD/spondylosis, not much change compared to prior MRI.  Pt not in favor of invasive therapy for her neck as of 04/2014.   Cholelithiasis    COPD (chronic obstructive pulmonary disease) (HCC)    Moderate: anoro started 04/2017 by pulm, pt symptomatically improved and PFTs stable at f/u 06/2017.   Coronary artery spasm ([place]) 07/11/2018   nitrates=HA. Amlodipine=intol swelling.  Changed to coreg 09/03/18 by cardiologist.   Depression    Dilutional hyponatremia    Endometritis 05/2017   possible; empiric tx with Flagyl by GYN.   Environmental allergies    Fibromyalgia    GERD (gastroesophageal reflux disease)    Hepatic steatosis 03/2019   u/s abd   Herpes zoster 08/07/2017   L scapular region   Hiatal hernia    History of adenomatous polyp of colon 04/2008; 08/2014   No high grade dysplasia: recall 5 yrs ([name], [name])   History of basal cell carcinoma excision    NOSE   History of Helicobacter pylori infection 04/2008   +gastric biopsy (gastritis but no metaplasia, dysplasia, or malignancy identified)   History of kidney stones    History of TIA (transient ischemic attack)    secondary to HELLP syndrome 1994 post c/s--  residual memory loss   Hyperlipidemia    statin started  after her NSTEMI: goal  LDL <70.   Hypertension    Internal hemorrhoids    Ischemic cardiomyopathy 05/2018   Echo EF 45-50% in context of NSTEMI from CA spasm. 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 04/2017.   Leg pain    ABIs 11/2019: normal    Memory loss    Abnl MRI brain and CT brain c/w chronic microvascular ischemia.  Repeat MRI brain 02/2014 stable (Dr. Mayra Reel with no plan to f/u with neuro as of 04/2014)   NSTEMI (non-ST elevated myocardial infarction) ([place]) 05/2018   Echo EF 45-50% in context of NSTEMI from CA spasm.// Myoview 8/21: EF 66, no ischemia, low risk   Osteoarthritis of left wrist    STT and 1st [place] jt   Osteoporosis 05/2010   2012 'penia; 06/2015 'porosis:  prolia.  DEXA 09/2017 T-score -2.2 femur neck. 11/2021 T score -2.2. Rpt 2 yrs   Pelvic floor dysfunction    [facility] urol ([name])   Pneumonia    Postmenopausal vaginal bleeding 05/2017   x 1 small episode; GYN attempted endo bx but unable to penetrate cervix due to severe cerv stenosis (due to postmenopausal state + hx of LEEP).  Endo u/s showed thin endo lining.  Per GYN---no evidence of endomet pathology on w/u---obs as of 07/2017.   Prediabetes 06/2016   Fasting gluc 105; HbA1c at that time was 6.0%.  A1c 6.2% 12/2017.  A1c 5.7% Feb 2023.   Recurrent kidney stones 08/2013  Left ureteral calculus: Perc nephr--cystoscopy w/ureteroscopy + laser for stone removal.  Residual asymptomatic left renal nephrolithiasis <67m present post-procedure.  Right sided hydronephrosis-persistent (on u/s)--urol ordered CT to further eval 08/2016: 1.6 cm nonobstructing stone lower pole left kidney, no hydro, plan for PCN extraction ([facility])   Shortness of breath    Sprain of neck  12/19/2013   Stroke (HCC)    TIA   TMJ (temporomandibular joint disorder)    USES  MOUTH GUARD   Toe fracture, left 12/2017   2nd toe prox phalanx (immobilization, Dr. RZadie Rhine   Tubular adenoma of colon    Vitamin D deficiency 05/2011   Weak urinary stream 07/2016   with elevated PVR and mild left hydronephrosis secondary to this ([facility] started flomax 0.'4mg'$  qd for this).      Dispostion: Discharge     Final Clinical Impression(s) / ED Diagnoses Final diagnoses:  None     This chart was dictated using voice recognition software.  Despite best efforts to proofread,  errors can occur which can change the documentation meaning.    SCristie Hem MD 11/22/21 2356

## 2021-11-22 NOTE — Telephone Encounter (Signed)
Patient c/o Palpitations:  High priority if patient c/o lightheadedness, shortness of breath, or chest pain  How long have you had palpitations/irregular HR/ Afib? Are you having the symptoms now? Swollen belly   Are you currently experiencing lightheadedness, SOB or CP? Lightheadedness, sob   Do you have a history of afib (atrial fibrillation) or irregular heart rhythm? yes  Have you checked your BP or HR? (document readings if available): 158/64; 162/99  Are you experiencing any other symptoms?

## 2021-11-22 NOTE — Discharge Instructions (Signed)
We evaluated you in the emergency department today for your shortness of breath.  Your testing was negative for signs of pneumonia, collapsed lung, heart attack, or heart failure.  Your vital signs were reassuring including a normal oxygen level.  Your other tests were negative.  Please follow-up with your primary care doctor.  Your CT scan showed a very small collection of fluid outside of your lung, which is too small to cause your symptoms today, but does need follow-up.  Please return to the emergency department if you develop any worsening symptoms, fevers, cough, fainting, severe chest pain, or any other concerning symptoms.

## 2021-11-22 NOTE — Telephone Encounter (Signed)
Patient went to [facility] ED for evaluation. FYI

## 2021-11-22 NOTE — Telephone Encounter (Signed)
LM for pt to return call. No ED visit note found.  Patient contacted cardiologist, Dr.Turner about an hour ago: Erin Barter, RN 11/22/21 12:22 PM:  Patient call sent straight to triage. Patient complaining of lightheadedness, SOB, swollen abdomen, elevated HR and BP. Patient thinks she might be in A. FIB. Asked patient if she had history of A. FIB, and she stated no that her brother had it. Encouraged patient to go to the ED to get evaluated. Patient also started discussing her latest medication change for her bipolar disorder. Informed patient to call her PCP about those medications. Patient verbalized understanding. Patient stated she was going to go to the ED to get checked out.   [facility] [facility] - Client TELEPHONE ADVICE RECORD AccessNurse Patient Name: Erin Lopez Gender: Female DOB: 11/18/1950 Age: 71 Y 10 M 24D Return Phone Number: [phone] (Primary) Address: City/State/Zip: [place] [address] Client [facility] - Client Client Site [place] - Day Provider Crissie Sickles - MD Contact Type Call Who Is Calling Patient / Member / Family / Caregiver Call Type Triage / Clinical Relationship To Patient Self Return Phone Number [phone] (Primary) Chief Complaint Heart palpitations or irregular heartbeat Reason for Call Symptomatic / Request for [place] states she is having high blood pressure 158/86 heart rate 107 Translation No Nurse Assessment Nurse: Ellery Plunk, RN, Danica Date/Time (Eastern Time): 11/22/2021 10:31:54 AM Confirm and document reason for call. If symptomatic, describe symptoms. ---Caller states she has been having trouble with her blood pressure for the last couple of weeks since she was put on risperidone. Had cataract surgery on Friday. BP today is 158/86 and HR 107. Feeling short of breath and palpitations Does the patient have any new or  worsening symptoms? ---Yes Will a triage be completed? ---Yes Related visit to physician within the last 2 weeks? ---Yes Does the PT have any chronic conditions? (i.e. diabetes, asthma, this includes High risk factors for pregnancy, etc.) ---Yes List chronic conditions. ---hx heart attack in 2019, kidney failure, has a "spastic" heart Is this a behavioral health or substance abuse call? ---No Guidelines Guideline Title Affirmed Question Affirmed Notes Nurse Date/Time (Eastern Time) Heart Rate and Heartbeat Questions Dizziness, lightheadedness, or weakness Bringas, RN, Danica 11/22/2021 10:37:14 AM Disp. Time Eilene Ghazi Time) Disposition Final User 11/22/2021 10:43:57 AM Go to ED Now Yes Bringas, RN, Danica PLEASE NOTE: All timestamps contained within this report are represented as [place] Standard Time. CONFIDENTIALTY NOTICE: This fax transmission is intended only for the addressee. It contains information that is legally privileged, confidential or otherwise protected from use or disclosure. If you are not the intended recipient, you are strictly prohibited from reviewing, disclosing, copying using or disseminating any of this information or taking any action in reliance on or regarding this information. If you have received this fax in error, please notify us immediately by telephone so that we can arrange for its return to [place]. Phone: [phone], Toll-Free: [phone], Fax: [phone] Page: 2 of 2 Call Id: [id number] Final Disposition 11/22/2021 10:43:57 AM Go to ED Now Yes Ellery Plunk, RN, Fredric Dine Caller Disagree/Comply Disagree Caller Understands Yes PreDisposition InappropriateToAsk Care Advice Given Per Guideline GO TO ED NOW: * You need to be seen in the Emergency Department. * Leave now. Drive carefully. CARE ADVICE given per Heart Rate and Heartbeat Questions (Adult) guideline. Referrals GO TO FACILITY REFUSED

## 2021-11-23 ENCOUNTER — Other Ambulatory Visit: Payer: Self-pay | Admitting: Internal Medicine

## 2021-11-23 ENCOUNTER — Other Ambulatory Visit: Payer: Self-pay | Admitting: Family Medicine

## 2021-11-23 NOTE — Telephone Encounter (Signed)
noted 

## 2021-11-24 ENCOUNTER — Encounter: Payer: Medicare Other | Admitting: Physical Therapy

## 2021-11-24 NOTE — Telephone Encounter (Signed)
Patient has follow up 8/24

## 2021-11-25 ENCOUNTER — Encounter: Payer: Self-pay | Admitting: Family Medicine

## 2021-11-25 ENCOUNTER — Ambulatory Visit (INDEPENDENT_AMBULATORY_CARE_PROVIDER_SITE_OTHER): Payer: Medicare Other | Admitting: Family Medicine

## 2021-11-25 VITALS — BP 105/70 | HR 107 | Temp 97.9°F | Ht 66.0 in | Wt 182.8 lb

## 2021-11-25 DIAGNOSIS — I10 Essential (primary) hypertension: Secondary | ICD-10-CM | POA: Diagnosis not present

## 2021-11-25 DIAGNOSIS — F3177 Bipolar disorder, in partial remission, most recent episode mixed: Secondary | ICD-10-CM | POA: Diagnosis not present

## 2021-11-25 DIAGNOSIS — T50905A Adverse effect of unspecified drugs, medicaments and biological substances, initial encounter: Secondary | ICD-10-CM | POA: Diagnosis not present

## 2021-11-25 DIAGNOSIS — E876 Hypokalemia: Secondary | ICD-10-CM

## 2021-11-25 MED ORDER — POTASSIUM CHLORIDE CRYS ER 20 MEQ PO TBCR: 20.0000 meq | EXTENDED_RELEASE_TABLET | Freq: Every day | ORAL | 0 refills | Status: DC

## 2021-11-25 NOTE — Progress Notes (Signed)
OFFICE VISIT  11/25/2021  CC:  Chief Complaint  Patient presents with   Hypertension    HPI:    Patient is a 71 y.o. female who presents for 3-week follow-up bipolar disorder and hypertension.  1 uncontrolled hypertension. Increase HCTZ to 25 mg a day.  Continue losartan 25 mg daily.   #2 bipolar disorder, mixed episode. Significant improvement with starting Risperdal 0.5 mg nightly. We will continue this and continue her citalopram 20 mg daily and Lamictal 300 mg daily"  INTERIM HX:  She went to the emergency department 11/22/2021 for shortness of breath and feeling heart rate elevated. Evaluation there was reassuring.  Blood pressure was mildly elevated, pulse was 96, O2 sat was 98% . Chest x-ray normal.  Cardiac enzymes normal.  CBC normal, BMet normal other than potassium at 3.2. D-dimer was 0.55, which is slightly elevated.  CT angio chest negative for PE. Discharged home with no medication changes.  [place]ye [place] states she started noting gradually worsening sensation of weakness all over, shakiness, some periods of increased heart rate--all since getting on Risperdal we started a few weeks ago. She is on 1 mg daily dose. Blood pressures have come down a little bit, was 140/90 at home this morning.  Regarding mood and anxiousness and racing thoughts she says the Risperdal made her "feel great, like I was high". Says she has been working through some anxiety and depression more lately with some help of some online videos.  Past Medical History:  Diagnosis Date   Alcoholism in remission [facility])    states last alcohol 11 yrs ago   Anemia    Anxiety    Bipolar 1 disorder ([place])    Admission for mania x 2 (most recent 11/2017)   Cervical spondylosis without myelopathy 12/19/2013   MRI 02/2014: multilevel DDD/spondylosis, not much change compared to prior MRI.  Pt not in favor of invasive therapy for her neck as of 04/2014.   Cholelithiasis    COPD (chronic obstructive pulmonary  disease) (HCC)    Moderate: anoro started 04/2017 by pulm, pt symptomatically improved and PFTs stable at f/u 06/2017.   Coronary artery spasm ([place]) 07/11/2018   nitrates=HA. Amlodipine=intol swelling.  Changed to coreg 09/03/18 by cardiologist.   Depression    Dilutional hyponatremia    Endometritis 05/2017   possible; empiric tx with Flagyl by GYN.   Environmental allergies    Fibromyalgia    GERD (gastroesophageal reflux disease)    Hepatic steatosis 03/2019   u/s abd   Herpes zoster 08/07/2017   L scapular region   Hiatal hernia    History of adenomatous polyp of colon 04/2008; 08/2014   No high grade dysplasia: recall 5 yrs ([name], [name])   History of basal cell carcinoma excision    NOSE   History of Helicobacter pylori infection 04/2008   +gastric biopsy (gastritis but no metaplasia, dysplasia, or malignancy identified)   History of kidney stones    History of TIA (transient ischemic attack)    secondary to HELLP syndrome 1994 post c/s--  residual memory loss   Hyperlipidemia    statin started after her NSTEMI: goal  LDL <70.   Hypertension    Internal hemorrhoids    Ischemic cardiomyopathy 05/2018   Echo EF 45-50% in context of NSTEMI from CA spasm. // Echo 8/21: 55-60, mild LVH, normal RVSF, RVSP 40, trivial MR   Left foot pain    Hallux deformity+adhesive capsulitis+ hammertoe:  Severe structural bunion deformity with hallux  interphalangeus and severe arthritis of the second MPJ left foot--surgical repair/osteotomies by [name] 04/2017.   Leg pain    ABIs 11/2019: normal    Memory loss    Abnl MRI brain and CT brain c/w chronic microvascular ischemia.  Repeat MRI brain 02/2014 stable (Dr. Mayra Reel with no plan to f/u with neuro as of 04/2014)   NSTEMI (non-ST elevated myocardial infarction) ([place]) 05/2018   Echo EF 45-50% in context of NSTEMI from CA spasm.// Myoview 8/21: EF 66, no ischemia, low risk   Osteoarthritis of left wrist    STT and [address]st [place] [address] jt    Osteoporosis 05/2010   2012 'penia; 06/2015 'porosis:  prolia.  DEXA 09/2017 T-score -2.2 femur neck. 11/2021 T score -2.2. Rpt 2 yrs   Pelvic floor dysfunction    [facility] ([name])   Pneumonia    Postmenopausal vaginal bleeding 05/2017   x 1 small episode; GYN attempted endo bx but unable to penetrate cervix due to severe cerv stenosis (due to postmenopausal state + hx of LEEP).  Endo u/s showed thin endo lining.  Per GYN---no evidence of endomet pathology on w/u---obs as of 07/2017.   Prediabetes 06/2016   Fasting gluc 105; HbA1c at that time was 6.0%.  A1c 6.2% 12/2017.  A1c 5.7% Feb 2023.   Recurrent kidney stones 08/2013   Left ureteral calculus: Perc nephr--cystoscopy w/ureteroscopy + laser for stone removal.  Residual asymptomatic left renal nephrolithiasis <42m present post-procedure.  Right sided hydronephrosis-persistent (on u/s)--urol ordered CT to further eval 08/2016: 1.6 cm nonobstructing stone lower pole left kidney, no hydro, plan for PCN extraction ([facility])   Shortness of breath    Sprain of neck 12/19/2013   Stroke (HCC)    TIA   TMJ (temporomandibular joint disorder)    USES  MOUTH GUARD   Toe fracture, left 12/2017   2nd toe prox phalanx (immobilization, Dr. RZadie Rhine   Tubular adenoma of colon    Vitamin D deficiency 05/2011   Weak urinary stream 07/2016   with elevated PVR and mild left hydronephrosis secondary to this ([facility] started flomax 0.'4mg'$  qd for this).    Past Surgical History:  Procedure Laterality Date   ABIs  11/29/2019   NORMAL   ANTERIOR CERVICAL DECOMP/DISCECTOMY FUSION  06/30/2009   C3 -- C5  AND EXPLORATION OF FUSION C5-7 W/  PLATE REMOVAL   ARTHRODESIS METATARSALPHALANGEAL JOINT (MTPJ) Left 07/08/2021   Procedure: Left hallux metatarsophalangeal joint arthrodesis;  Surgeon: HWylene Simmer MD;  [place] [facility]  Service: Orthopedics;  Laterality: Left;   BACK SURGERY     BUNIONECTOMY Left    CARDIOVASCULAR STRESS  TEST  12/03/2019   myoc perf im: NORMAL   CERVICAL FUSION  2002   anterior C5 -- C7   CERVICAL SPINE SURGERY  08/27/1999     C6 -- T1  LAMINECTOMY/  DISKECTOMY   CESAREAN SECTION  1994   CHOLECYSTECTOMY N/A 03/06/2020   Procedure: LAPAROSCOPIC CHOLECYSTECTOMY;  Surgeon: SFelicie Morn MD;  [place] [facility]  Service: General;  Laterality: N/A;   COLONOSCOPY W/ POLYPECTOMY  04/2008;08/2014   2016 tubular adenoma x 1; +diverticulosis and int/ext hemorrhoids.  06/2020 adenoma, recall 5 yrs.   CYSTOSCOPY WITH URETEROSCOPY AND STENT PLACEMENT Left 08/28/2013   Procedure: CYSTOSCOPY, RGP,  WITH URETEROSCOPY AND STENT REMOVAL;  Surgeon: DBernestine Amass MD;  [facility]  Service: Urology;  Laterality: Left;   DEXA  09/2017   T-score -2.2 femur  neck: improved compared to 06/2015.  2021 DEXA T score -2.3.  11/2021 DEXA T score -2.2.   ESOPHAGOGASTRODUODENOSCOPY  2010; 08/2014   2016 +Candidal esophagitis; Mild chronic gastritis w/intestinal metaplasia--NEG H pylori, neg for eosinophilic esoph. 04/6107 esoph candidiasis (diflucan x 20d), o/w normal.   HAMMER TOE SURGERY Left 07/08/2021   Procedure: Second metatarsal head excision; revision Second hammertoe correction; 2-5 percutaneus flexor tenotomies;  Surgeon: Wylene Simmer, MD;  Location: [place];  Service: Orthopedics;  Laterality: Left;   HARDWARE REMOVAL Left 07/08/2021   Procedure: Removal of deep implants from hallux proximal phalanx and First and Second metatarsals;  Surgeon: Wylene Simmer, MD;  Location: [place];  Service: Orthopedics;  Laterality: Left;   HOLMIUM LASER APPLICATION Left 60/45/4098   Procedure: HOLMIUM LASER APPLICATION;  Surgeon: [name], MD;  Location: [facility];  Service: Urology;  Laterality: Left;   JOINT REPLACEMENT     LEFT HEART CATH AND CORONARY ANGIOGRAPHY N/A 05/15/2018   Evidence for spasm in distal LAD.  Mod RCA atherosclerosis, o/w  no CAD.  Procedure: LEFT HEART CATH AND CORONARY ANGIOGRAPHY;  Surgeon: Leonie Man, MD;  Location: [place] [facility];  Service: Cardiovascular;  Laterality: N/A;   NEGATIVE SLEEP STUDY  04/01/2007   NEPHROLITHOTOMY Left 08/05/2013   Procedure: NEPHROLITHOTOMY PERCUTANEOUS;  Surgeon: [name], MD;  Location: [facility];  Service: Urology;  Laterality: Left;   POLYSOMNOGRAM  01/2019   NORMAL   POLYSOMNOGRAPHY  10/25/2018   Dr. Dion Saucier due to pt inadequat sleep time (83 min).   TONSILLECTOMY AND ADENOIDECTOMY  1975   TOTAL KNEE ARTHROPLASTY Right 2003   TRANSTHORACIC ECHOCARDIOGRAM  05/2018; 12/03/2019   (after MI secondary to arterial spasm): EF 45-50%; severe hypokinesis of apical anterolateral, inferolateral , anterior, and inferior LV myocardium. 11/2019 EF 55-60%, valves fine   ULTRASOUND EXAM,PELVIC COMPLETE ([facility] HX)  06/25/2015   NORMAL (done by GYN)    Outpatient Medications Prior to Visit  Medication Sig Dispense Refill   acetaminophen (TYLENOL) 500 MG tablet Take 1,000 mg by mouth every 6 (six) hours as needed for mild pain or headache.      albuterol (VENTOLIN HFA) 108 (90 Base) MCG/ACT inhaler INHALE 2 PUFFS INTO THE LUNGS EVERY 4 HOURS AS NEEDED FOR WHEEZE OR FOR SHORTNESS OF BREATH 8.5 each 2   ALPRAZolam (XANAX) 0.5 MG tablet TAKE 1 TABLET BY MOUTH THREE TIMES A DAY AS NEEDED FOR ANXIETY 90 tablet 5   aspirin EC 81 MG EC tablet Take 1 tablet (81 mg total) by mouth daily. 90 tablet 3   atorvastatin (LIPITOR) 80 MG tablet TAKE 1 TABLET BY MOUTH DAILY AT 6 PM. 90 tablet 3   BESIVANCE 0.6 % SUSP Place 1 drop into the right eye 3 (three) times daily.     citalopram (CELEXA) 20 MG tablet TAKE 1 TABLET (20 MG TOTAL) BY MOUTH IN THE MORNING AND AT BEDTIME. 60 tablet 0   cyclobenzaprine (FLEXERIL) 10 MG tablet TAKE 1 TABLET BY MOUTH TWICE A DAY 60 tablet 1   doxycycline (VIBRAMYCIN) 50 MG capsule Take 50 mg by mouth daily.     famotidine (PEPCID) 40 MG  tablet TAKE 1 TABLET BY MOUTH EVERY DAY 90 tablet 3   fluticasone (FLONASE) 50 MCG/ACT nasal spray SPRAY 2 SPRAYS INTO EACH NOSTRIL EVERY DAY 48 mL 1   hydrochlorothiazide (HYDRODIURIL) 25 MG tablet Take 1 tablet (25 mg total) by mouth daily. (Patient taking differently:  Take 12.5 mg by mouth daily.) 30 tablet 1   ipratropium-albuterol (DUONEB) 0.5-2.5 (3) MG/3ML SOLN TAKE 3 MLS BY NEBULIZATION 3 (THREE) TIMES DAILY AS NEEDED. 360 mL 3   lamoTRIgine (LAMICTAL) 100 MG tablet Take 3 tablets (300 mg total) by mouth at bedtime. 270 tablet 3   losartan (COZAAR) 50 MG tablet Take 1 tablet (50 mg total) by mouth daily. 90 tablet 0   Magnesium 250 MG TABS Take 250 mg by mouth daily.      meloxicam (MOBIC) 15 MG tablet TAKE 1 TABLET (15 MG TOTAL) BY MOUTH DAILY. 15 tablet 0   metoprolol succinate (TOPROL-XL) 100 MG 24 hr tablet Take 1 tablet (100 mg total) by mouth daily. Take with or immediately following a meal. 90 tablet 3   montelukast (SINGULAIR) 10 MG tablet TAKE 1 TABLET BY MOUTH EVERY DAY IN THE EVENING 90 tablet 1   Multiple Vitamin (MULTI-VITAMINS) TABS Take 1 tablet by mouth daily.      nitroGLYCERIN (NITROSTAT) 0.4 MG SL tablet Place 1 tablet (0.4 mg total) under the tongue every 5 (five) minutes x 3 doses as needed for chest pain. (Patient not taking: Reported on 11/10/2021) 25 tablet 3   oxyCODONE (OXY IR/ROXICODONE) 5 MG immediate release tablet 1 tab po q6h prn pain 30 tablet 0   pantoprazole (PROTONIX) 40 MG tablet Take 1 tablet (40 mg total) by mouth daily. Please schedule a yearly follow up for further refills. Thank you. 90 tablet 0   polyethylene glycol powder (GLYCOLAX/MIRALAX) powder TAKE 17 G BY MOUTH DAILY. 3162 g 0   prednisoLONE acetate (PRED FORTE) 1 % ophthalmic suspension Place 1 drop into the right eye 4 (four) times daily.     PROLENSA 0.07 % SOLN Place 1 drop into the right eye at bedtime.     Propylene Glycol 0.6 % SOLN Apply 1 drop to eye 2 (two) times daily.     tamsulosin  (FLOMAX) 0.4 MG CAPS capsule Take 0.4 mg by mouth daily.     Tiotropium Bromide-Olodaterol (STIOLTO RESPIMAT) 2.5-2.5 MCG/ACT AERS INHALE 2 PUFFS BY MOUTH INTO THE LUNGS DAILY 4 g 5   triamcinolone cream (KENALOG) 0.1 % Apply 1 application topically daily.     zolpidem (AMBIEN) 10 MG tablet TAKE 1 TABLET BY MOUTH EVERYDAY AT BEDTIME 90 tablet 1   risperiDONE (RISPERDAL) 0.5 MG tablet Take 2 tabs po qhs 60 tablet 0   No facility-administered medications prior to visit.    Allergies  Allergen Reactions   Ceftin [Cefuroxime Axetil] Anaphylaxis    Swelling of eyes and tongue   Ciprofloxacin Anaphylaxis    Difficulty breathing and generalized swelling   Fosamax [Alendronate Sodium] Diarrhea   Morphine And Related Other (See Comments)    Hallucinations/ disoriented - pt stated, "Only Morphine" - 03/23/17   Oxycodone Other (See Comments)    Patient was "hooked" on medication.   Prednisone Other (See Comments)    Hyperactivity   Seroquel [Quetiapine] Other (See Comments)    oversedation    ROS As per HPI  PE:    11/25/2021    4:03 PM 11/22/2021    9:08 PM 11/22/2021    7:45 PM  Vitals with BMI  Height '5\' 6"'$     Weight 182 lbs 13 oz    BMI 63.89    Systolic 373 428 768  Diastolic 70 88 90  Pulse 115 88 92  Manual blood pressure today: 140/90.  Physical Exam  Gen: Alert, well appearing.  Patient  is oriented to person, place, time, and situation.. AFFECT: pleasant, lucid thought and speech. No further exam today.  LABS:  Last CBC Lab Results  Component Value Date   WBC 6.4 11/22/2021   HGB 11.8 (L) 11/22/2021   HCT 33.8 (L) 11/22/2021   MCV 88.3 11/22/2021   MCH 30.8 11/22/2021   RDW 12.1 11/22/2021   PLT 229 11/22/2021   Lab Results  Component Value Date   IRON 84 04/29/2014   FERRITIN 27.6 04/29/2014   Lab Results  Component Value Date   VITAMINB12 688 50/53/9767   Last metabolic panel Lab Results  Component Value Date   GLUCOSE 160 (H) 11/22/2021   NA  137 11/22/2021   K 3.2 (L) 11/22/2021   CL 100 11/22/2021   CO2 26 11/22/2021   BUN 12 11/22/2021   CREATININE 0.82 11/22/2021   GFRNONAA >60 11/22/2021   CALCIUM 9.8 11/22/2021   PROT 6.6 05/06/2021   ALBUMIN 4.6 05/06/2021   BILITOT 0.8 05/06/2021   ALKPHOS 55 05/06/2021   AST 23 05/06/2021   ALT 22 05/06/2021   ANIONGAP 11 11/22/2021   Last thyroid functions Lab Results  Component Value Date   TSH 1.55 02/19/2019   Lab Results  Component Value Date   DDIMER 0.55 (H) 11/22/2021   IMPRESSION AND PLAN:  #1 episodic shaking, generalized weakness, increased heart rate. This all started after she got on Risperdal. Stop Risperdal.  2.  Uncontrolled hypertension. I believe some of her marked elevations lately have been part of this reaction she had the Risperdal. She will monitor off Risperdal and see how things go and will recheck in 1 week.  #3 hypokalemia . --potassium level in the ED recently was a little low since we increased HCTZ to 25 mg. Start KDUR 20 mEQ qd and recheck at f/u in 1 week.  #4 bipolar disorder, recent mixed episode. Is not clear that Risperdal did anything positive for her at all. We will continue on citalopram 20 mg a day, Lamictal 300 mg a day, and alprazolam 0.5 3 times daily. No new psychiatric medicine today.  An After Visit Summary was printed and given to the patient.  FOLLOW UP: Return in about 1 week (around 12/02/2021) for f/u bp/hr/mood.  Signed:  Crissie Sickles, MD           11/25/2021

## 2021-11-26 ENCOUNTER — Ambulatory Visit: Payer: Medicare Other | Admitting: Family Medicine

## 2021-11-26 DIAGNOSIS — H2511 Age-related nuclear cataract, right eye: Secondary | ICD-10-CM | POA: Diagnosis not present

## 2021-11-26 DIAGNOSIS — H52223 Regular astigmatism, bilateral: Secondary | ICD-10-CM | POA: Diagnosis not present

## 2021-11-26 DIAGNOSIS — Z961 Presence of intraocular lens: Secondary | ICD-10-CM | POA: Diagnosis not present

## 2021-11-29 ENCOUNTER — Other Ambulatory Visit: Payer: Self-pay | Admitting: Family Medicine

## 2021-11-29 ENCOUNTER — Other Ambulatory Visit: Payer: Self-pay | Admitting: Pulmonary Disease

## 2021-12-02 ENCOUNTER — Ambulatory Visit (INDEPENDENT_AMBULATORY_CARE_PROVIDER_SITE_OTHER): Payer: Medicare Other | Admitting: Family Medicine

## 2021-12-02 ENCOUNTER — Ambulatory Visit: Payer: Medicare Other | Admitting: Physical Therapy

## 2021-12-02 ENCOUNTER — Encounter: Payer: Self-pay | Admitting: Physical Therapy

## 2021-12-02 ENCOUNTER — Encounter: Payer: Self-pay | Admitting: Family Medicine

## 2021-12-02 VITALS — BP 119/75 | HR 66 | Temp 98.0°F | Ht 66.0 in | Wt 186.6 lb

## 2021-12-02 DIAGNOSIS — M25675 Stiffness of left foot, not elsewhere classified: Secondary | ICD-10-CM | POA: Diagnosis not present

## 2021-12-02 DIAGNOSIS — M79675 Pain in left toe(s): Secondary | ICD-10-CM | POA: Diagnosis not present

## 2021-12-02 DIAGNOSIS — M542 Cervicalgia: Secondary | ICD-10-CM | POA: Diagnosis not present

## 2021-12-02 DIAGNOSIS — R262 Difficulty in walking, not elsewhere classified: Secondary | ICD-10-CM

## 2021-12-02 DIAGNOSIS — F331 Major depressive disorder, recurrent, moderate: Secondary | ICD-10-CM | POA: Diagnosis not present

## 2021-12-02 DIAGNOSIS — E876 Hypokalemia: Secondary | ICD-10-CM

## 2021-12-02 DIAGNOSIS — I10 Essential (primary) hypertension: Secondary | ICD-10-CM

## 2021-12-02 MED ORDER — BUPROPION HCL ER (XL) 150 MG PO TB24: 150.0000 mg | ORAL_TABLET | Freq: Every day | ORAL | 1 refills | Status: DC

## 2021-12-02 NOTE — Progress Notes (Signed)
OFFICE VISIT  12/02/2021  CC:  Chief Complaint  Patient presents with   Follow-up    BP/HR   Patient is a 71 y.o. female who presents for f/u BP/HR and mood. A/P as of last visit: "#1 episodic shaking, generalized weakness, increased heart rate. This all started after she got on Risperdal. Stop Risperdal.   2.  Uncontrolled hypertension. I believe some of her marked elevations lately have been part of this reaction she had the Risperdal. She will monitor off Risperdal and see how things go and will recheck in 1 week.   #3 hypokalemia . --potassium level in the ED recently was a little low since we increased HCTZ to 25 mg. Start KDUR 20 mEQ qd and recheck at f/u in 1 week.   #4 bipolar disorder, recent mixed episode. Is not clear that Risperdal did anything positive for her at all. We will continue on citalopram 20 mg a day, Lamictal 300 mg a day, and alprazolam 0.5 3 times daily. No new psychiatric medicine today."  INTERIM HX: Soon after she stopped the Risperdal her blood pressure normalized and her heart rate normalized.  She began feeling back to her normal baseline state of physical health. Unfortunately she has gone into depressed mood again.  Anhedonia, hopelessness, crying spells, poor motivation, wants to sleep more.  Appetite is good. No SI or HI. Anxiety level is pretty stable.  No racing thoughts or other signs to suggest hypomania or mania.  He did start taking the potassium I prescribed last visit.  ROS as above, plus--> no fevers, no CP, no SOB, no wheezing, no cough, no dizziness, no HAs, no rashes, no melena/hematochezia.  No polyuria or polydipsia.   No focal weakness, paresthesias, or tremors.  No acute vision or hearing abnormalities.  No dysuria or unusual/new urinary urgency or frequency.  No recent changes in lower legs. No n/v/d or abd pain.  No palpitations.     Past Medical History:  Diagnosis Date   Alcoholism in remission [facility])    states last  alcohol 11 yrs ago   Anemia    Anxiety    Bipolar 1 disorder ([place])    Admission for mania x 2 (most recent 11/2017)   Cervical spondylosis without myelopathy 12/19/2013   MRI 02/2014: multilevel DDD/spondylosis, not much change compared to prior MRI.  Pt not in favor of invasive therapy for her neck as of 04/2014.   Cholelithiasis    COPD (chronic obstructive pulmonary disease) (HCC)    Moderate: anoro started 04/2017 by pulm, pt symptomatically improved and PFTs stable at f/u 06/2017.   Coronary artery spasm ([place]) 07/11/2018   nitrates=HA. Amlodipine=intol swelling.  Changed to coreg 09/03/18 by cardiologist.   Depression    Dilutional hyponatremia    Endometritis 05/2017   possible; empiric tx with Flagyl by GYN.   Environmental allergies    Fibromyalgia    GERD (gastroesophageal reflux disease)    Hepatic steatosis 03/2019   u/s abd   Herpes zoster 08/07/2017   L scapular region   Hiatal hernia    History of adenomatous polyp of colon 04/2008; 08/2014   No high grade dysplasia: recall 5 yrs ([name], [name])   History of basal cell carcinoma excision    NOSE   History of Helicobacter pylori infection 04/2008   +gastric biopsy (gastritis but no metaplasia, dysplasia, or malignancy identified)   History of kidney stones    History of TIA (transient ischemic attack)    secondary  to HELLP syndrome 1994 post c/s--  residual memory loss   Hyperlipidemia    statin started after her NSTEMI: goal  LDL <70.   Hypertension    Internal hemorrhoids    Ischemic cardiomyopathy 05/2018   Echo EF 45-50% in context of NSTEMI from CA spasm. 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 04/2017.   Leg pain    ABIs 11/2019: normal    Memory loss    Abnl  MRI brain and CT brain c/w chronic microvascular ischemia.  Repeat MRI brain 02/2014 stable (Dr. Mayra Reel with no plan to f/u with neuro as of 04/2014)   NSTEMI (non-ST elevated myocardial infarction) ([place]) 05/2018   Echo EF 45-50% in context of NSTEMI from CA spasm.// Myoview 8/21: EF 66, no ischemia, low risk   Osteoarthritis of left wrist    STT and 1st [place] jt   Osteoporosis 05/2010   2012 'penia; 06/2015 'porosis:  prolia.  DEXA 09/2017 T-score -2.2 femur neck. 11/2021 T score -2.2. Rpt 2 yrs   Pelvic floor dysfunction    [facility] ([name])   Pneumonia    Postmenopausal vaginal bleeding 05/2017   x 1 small episode; GYN attempted endo bx but unable to penetrate cervix due to severe cerv stenosis (due to postmenopausal state + hx of LEEP).  Endo u/s showed thin endo lining.  Per GYN---no evidence of endomet pathology on w/u---obs as of 07/2017.   Prediabetes 06/2016   Fasting gluc 105; HbA1c at that time was 6.0%.  A1c 6.2% 12/2017.  A1c 5.7% Feb 2023.   Recurrent kidney stones 08/2013   Left ureteral calculus: Perc nephr--cystoscopy w/ureteroscopy + laser for stone removal.  Residual asymptomatic left renal nephrolithiasis <40m present post-procedure.  Right sided hydronephrosis-persistent (on u/s)--urol ordered CT to further eval 08/2016: 1.6 cm nonobstructing stone lower pole left kidney, no hydro, plan for PCN extraction ([facility])   Shortness of breath    Sprain of neck 12/19/2013   Stroke (HCC)    TIA   TMJ (temporomandibular joint disorder)    USES  MOUTH GUARD   Toe fracture, left 12/2017   2nd toe prox phalanx (immobilization, Dr. RZadie Rhine   Tubular adenoma of colon    Vitamin D deficiency 05/2011   Weak urinary stream 07/2016   with elevated PVR and mild left hydronephrosis secondary to this ([facility] started flomax 0.'4mg'$  qd for this).    Past Surgical History:  Procedure Laterality Date   ABIs  11/29/2019   NORMAL   ANTERIOR CERVICAL DECOMP/DISCECTOMY FUSION   06/30/2009   C3 -- C5  AND EXPLORATION OF FUSION C5-7 W/  PLATE REMOVAL   ARTHRODESIS METATARSALPHALANGEAL JOINT (MTPJ) Left 07/08/2021   Procedure: Left hallux metatarsophalangeal joint arthrodesis;  Surgeon: HWylene Simmer MD;  Location: [facility]  Service: Orthopedics;  Laterality: Left;   BACK SURGERY     BUNIONECTOMY Left    CARDIOVASCULAR STRESS TEST  12/03/2019   myoc perf im: NORMAL   CERVICAL FUSION  2002   anterior C5 -- C7   CERVICAL SPINE SURGERY  08/27/1999     C6 -- T1  LAMINECTOMY/  DISKECTOMY   CESAREAN SECTION  1994   CHOLECYSTECTOMY N/A 03/06/2020   Procedure: LAPAROSCOPIC CHOLECYSTECTOMY;  Surgeon: SFelicie Morn MD;  Location: [facility];  Service: General;  Laterality: N/A;   COLONOSCOPY W/ POLYPECTOMY  04/2008;08/2014   2016 tubular adenoma x 1; +diverticulosis and int/ext hemorrhoids.  06/2020 adenoma, recall 5 yrs.   CYSTOSCOPY WITH URETEROSCOPY AND STENT PLACEMENT Left 08/28/2013   Procedure: CYSTOSCOPY, RGP,  WITH URETEROSCOPY AND STENT REMOVAL;  Surgeon: [name], MD;  Location: [facility];  Service: Urology;  Laterality: Left;   DEXA  09/2017   T-score -2.2 femur neck: improved compared to 06/2015.  2021 DEXA T score -2.3.  11/2021 DEXA T score -2.2.   ESOPHAGOGASTRODUODENOSCOPY  2010; 08/2014   2016 +Candidal esophagitis; Mild chronic gastritis w/intestinal metaplasia--NEG H pylori, neg for eosinophilic esoph. 12/9369 esoph candidiasis (diflucan x 20d), o/w normal.   HAMMER TOE SURGERY Left 07/08/2021   Procedure: Second metatarsal head excision; revision Second hammertoe correction; 2-5 percutaneus flexor tenotomies;  Surgeon: Wylene Simmer, MD;  Location: [facility];  Service: Orthopedics;  Laterality: Left;   HARDWARE REMOVAL Left 07/08/2021   Procedure: Removal of deep implants from hallux proximal phalanx and First and Second metatarsals;  Surgeon: Wylene Simmer, MD;  Location: [facility];   Service: Orthopedics;  Laterality: Left;   HOLMIUM LASER APPLICATION Left 69/67/8938   Procedure: HOLMIUM LASER APPLICATION;  Surgeon: [name], MD;  Location: [facility];  Service: Urology;  Laterality: Left;   JOINT REPLACEMENT     LEFT HEART CATH AND CORONARY ANGIOGRAPHY N/A 05/15/2018   Evidence for spasm in distal LAD.  Mod RCA atherosclerosis, o/w no CAD.  Procedure: LEFT HEART CATH AND CORONARY ANGIOGRAPHY;  Surgeon: Leonie Man, MD;  Location: [facility];  Service: Cardiovascular;  Laterality: N/A;   NEGATIVE SLEEP STUDY  04/01/2007   NEPHROLITHOTOMY Left 08/05/2013   Procedure: NEPHROLITHOTOMY PERCUTANEOUS;  Surgeon: [name], MD;  Location: [facility];  Service: Urology;  Laterality: Left;   POLYSOMNOGRAM  01/2019   NORMAL   POLYSOMNOGRAPHY  10/25/2018   Dr. Dion Saucier due to pt inadequat sleep time (83 min).   TONSILLECTOMY AND ADENOIDECTOMY  1975   TOTAL KNEE ARTHROPLASTY Right 2003   TRANSTHORACIC ECHOCARDIOGRAM  05/2018; 12/03/2019   (after MI secondary to arterial spasm): EF 45-50%; severe hypokinesis of apical anterolateral, inferolateral , anterior, and inferior LV myocardium. 11/2019 EF 55-60%, valves fine   ULTRASOUND EXAM,PELVIC COMPLETE ([facility] HX)  06/25/2015   NORMAL (done by GYN)    Outpatient Medications Prior to Visit  Medication Sig Dispense Refill   acetaminophen (TYLENOL) 500 MG tablet Take 1,000 mg by mouth every 6 (six) hours as needed for mild pain or headache.      albuterol (VENTOLIN HFA) 108 (90 Base) MCG/ACT inhaler INHALE 2 PUFFS INTO THE LUNGS EVERY 4 HOURS AS NEEDED FOR WHEEZE OR FOR SHORTNESS OF BREATH 8.5 each 2   ALPRAZolam (XANAX) 0.5 MG tablet TAKE 1 TABLET BY MOUTH THREE TIMES A DAY AS NEEDED FOR ANXIETY 90 tablet 5   aspirin EC 81 MG EC tablet Take 1 tablet (81 mg total) by mouth daily. 90 tablet 3   atorvastatin (LIPITOR) 80 MG tablet TAKE 1 TABLET BY MOUTH DAILY AT 6 PM. 90 tablet 3    BESIVANCE 0.6 % SUSP Place 1 drop into the right eye 3 (three) times daily. (Patient not taking: Reported on 12/02/2021)     citalopram (CELEXA) 20 MG tablet TAKE 1 TABLET (20 MG TOTAL) BY MOUTH IN THE MORNING AND AT BEDTIME 60 tablet 0  cyclobenzaprine (FLEXERIL) 10 MG tablet TAKE 1 TABLET BY MOUTH TWICE A DAY 60 tablet 1   doxycycline (VIBRAMYCIN) 50 MG capsule Take 50 mg by mouth daily.     famotidine (PEPCID) 40 MG tablet TAKE 1 TABLET BY MOUTH EVERY DAY 90 tablet 3   fluticasone (FLONASE) 50 MCG/ACT nasal spray SPRAY 2 SPRAYS INTO EACH NOSTRIL EVERY DAY 48 mL 1   hydrochlorothiazide (HYDRODIURIL) 25 MG tablet Take 1 tablet (25 mg total) by mouth daily. (Patient taking differently: Take 12.5 mg by mouth daily.) 30 tablet 1   ipratropium-albuterol (DUONEB) 0.5-2.5 (3) MG/3ML SOLN TAKE 3 MLS BY NEBULIZATION 3 (THREE) TIMES DAILY AS NEEDED. 360 mL 3   lamoTRIgine (LAMICTAL) 100 MG tablet Take 3 tablets (300 mg total) by mouth at bedtime. 270 tablet 3   losartan (COZAAR) 50 MG tablet Take 1 tablet (50 mg total) by mouth daily. 90 tablet 0   Magnesium 250 MG TABS Take 250 mg by mouth daily.      meloxicam (MOBIC) 15 MG tablet TAKE 1 TABLET (15 MG TOTAL) BY MOUTH DAILY. 15 tablet 0   metoprolol succinate (TOPROL-XL) 100 MG 24 hr tablet Take 1 tablet (100 mg total) by mouth daily. Take with or immediately following a meal. 90 tablet 3   montelukast (SINGULAIR) 10 MG tablet TAKE 1 TABLET BY MOUTH EVERY DAY IN THE EVENING 90 tablet 1   Multiple Vitamin (MULTI-VITAMINS) TABS Take 1 tablet by mouth daily.      nitroGLYCERIN (NITROSTAT) 0.4 MG SL tablet Place 1 tablet (0.4 mg total) under the tongue every 5 (five) minutes x 3 doses as needed for chest pain. (Patient not taking: Reported on 11/10/2021) 25 tablet 3   oxyCODONE (OXY IR/ROXICODONE) 5 MG immediate release tablet 1 tab po q6h prn pain 30 tablet 0   pantoprazole (PROTONIX) 40 MG tablet Take 1 tablet (40 mg total) by mouth daily. Please schedule a  yearly follow up for further refills. Thank you. 90 tablet 0   polyethylene glycol powder (GLYCOLAX/MIRALAX) powder TAKE 17 G BY MOUTH DAILY. 3162 g 0   potassium chloride SA (KLOR-CON M) 20 MEQ tablet Take 1 tablet (20 mEq total) by mouth daily. 30 tablet 0   prednisoLONE acetate (PRED FORTE) 1 % ophthalmic suspension Place 1 drop into the right eye 4 (four) times daily.     PROLENSA 0.07 % SOLN Place 1 drop into the right eye at bedtime.     Propylene Glycol 0.6 % SOLN Apply 1 drop to eye 2 (two) times daily.     tamsulosin (FLOMAX) 0.4 MG CAPS capsule Take 0.4 mg by mouth daily.     Tiotropium Bromide-Olodaterol (STIOLTO RESPIMAT) 2.5-2.5 MCG/ACT AERS INHALE 2 PUFFS BY MOUTH INTO THE LUNGS DAILY 4 g 1   triamcinolone cream (KENALOG) 0.1 % Apply 1 application topically daily.     zolpidem (AMBIEN) 10 MG tablet TAKE 1 TABLET BY MOUTH EVERYDAY AT BEDTIME 90 tablet 1   No facility-administered medications prior to visit.    Allergies  Allergen Reactions   Ceftin [Cefuroxime Axetil] Anaphylaxis    Swelling of eyes and tongue   Ciprofloxacin Anaphylaxis    Difficulty breathing and generalized swelling   Risperdal [Risperidone] Palpitations    Generalized weakness/malaise/palpitations/inc bp   Fosamax [Alendronate Sodium] Diarrhea   Morphine And Related Other (See Comments)    Hallucinations/ disoriented - pt stated, "Only Morphine" - 03/23/17   Oxycodone Other (See Comments)    Patient was "hooked" on medication.  Prednisone Other (See Comments)    Hyperactivity   Seroquel [Quetiapine] Other (See Comments)    oversedation    ROS As per HPI  PE:    12/02/2021    2:08 PM 11/25/2021    4:03 PM 11/22/2021    9:08 PM  Vitals with BMI  Height '5\' 6"'$  '5\' 6"'$    Weight 186 lbs 10 oz 182 lbs 13 oz   BMI 00.17 49.44   Systolic 967 591 638  Diastolic 75 70 88  Pulse 66 107 88     Physical Exam  Gen: Alert, well appearing.  Patient is oriented to person, place, time, and  situation. AFFECT: pleasant, lucid thought and speech. CV: RRR, no m/r/g.   LUNGS: CTA bilat, nonlabored resps, good aeration in all lung fields. EXT: no clubbing or cyanosis.  no edema.  No tremor  LABS:  Last metabolic panel Lab Results  Component Value Date   GLUCOSE 160 (H) 11/22/2021   NA 137 11/22/2021   K 3.2 (L) 11/22/2021   CL 100 11/22/2021   CO2 26 11/22/2021   BUN 12 11/22/2021   CREATININE 0.82 11/22/2021   GFRNONAA >60 11/22/2021   CALCIUM 9.8 11/22/2021   PROT 6.6 05/06/2021   ALBUMIN 4.6 05/06/2021   BILITOT 0.8 05/06/2021   ALKPHOS 55 05/06/2021   AST 23 05/06/2021   ALT 22 05/06/2021   ANIONGAP 11 11/22/2021   IMPRESSION AND PLAN:  #1 hypertension, well controlled now on HCTZ 25 mg a day, losartan 50 mg a day, and Toprol-XL 100 mg a day.  #2 medication side effects (from risperdal)--resolved.  #3 major depressive disorder, recurrent. Add Wellbutrin XL 150 mg a day.  Continue citalopram 20 mg a day and Lamictal 300 mg a day.  #4 hypokalemia.  This is due to our relatively recent increase in her HCTZ dose. She has taken supplemental potassium for the last 1 week. Recheck level today.  An After Visit Summary was printed and given to the patient.  FOLLOW UP: Return in about 4 weeks (around 12/30/2021) for f/u mood.  Signed:  Crissie Sickles, MD           12/02/2021

## 2021-12-02 NOTE — Therapy (Signed)
OUTPATIENT PHYSICAL THERAPY LOWER EXTREMITY TREATMENT/Progress Note    Patient Name: Erin Lopez MRN: [id number] DOB:05/05/1950, 71 y.o., female Today's Date: 12/02/2021  Physical Therapy Progress Note  Dates of Reporting Period: 09/23/21  to  8 /31/23     PT End of Session - 12/02/21 1158     Visit Number 7    Number of Visits 13    Date for PT Re-Evaluation 12/22/21    Authorization Type [employer] MCR,   PN done at visit 7 .    PT Start Time 1017    PT Stop Time 1058    PT Time Calculation (min) 41 min    Equipment Utilized During Treatment Gait belt    Activity Tolerance Patient tolerated treatment well    Behavior During Therapy WFL for tasks assessed/performed                Past Medical History:  Diagnosis Date   Alcoholism in remission [facility])    states last alcohol 11 yrs ago   Anemia    Anxiety    Bipolar 1 disorder ([place])    Admission for mania x 2 (most recent 11/2017)   Cervical spondylosis without myelopathy 12/19/2013   MRI 02/2014: multilevel DDD/spondylosis, not much change compared to prior MRI.  Pt not in favor of invasive therapy for her neck as of 04/2014.   Cholelithiasis    COPD (chronic obstructive pulmonary disease) (HCC)    Moderate: anoro started 04/2017 by pulm, pt symptomatically improved and PFTs stable at f/u 06/2017.   Coronary artery spasm ([place]) 07/11/2018   nitrates=HA. Amlodipine=intol swelling.  Changed to coreg 09/03/18 by cardiologist.   Depression    Dilutional hyponatremia    Endometritis 05/2017   possible; empiric tx with Flagyl by GYN.   Environmental allergies    Fibromyalgia    GERD (gastroesophageal reflux disease)    Hepatic steatosis 03/2019   u/s abd   Herpes zoster 08/07/2017   L scapular region   Hiatal hernia    History of adenomatous polyp of colon 04/2008; 08/2014   No high grade dysplasia: recall 5 yrs ([name], [name])   History of basal cell carcinoma excision    NOSE   History of Helicobacter pylori  infection 04/2008   +gastric biopsy (gastritis but no metaplasia, dysplasia, or malignancy identified)   History of kidney stones    History of TIA (transient ischemic attack)    secondary to HELLP syndrome 1994 post c/s--  residual memory loss   Hyperlipidemia    statin started after her NSTEMI: goal  LDL <70.   Hypertension    Internal hemorrhoids    Ischemic cardiomyopathy 05/2018   Echo EF 45-50% in context of NSTEMI from CA spasm. 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 04/2017.   Leg pain    ABIs 11/2019: normal    Memory loss    Abnl MRI brain and CT brain c/w chronic microvascular ischemia.  Repeat MRI brain 02/2014 stable (Dr. Mayra Reel with no plan to f/u with neuro as of 04/2014)   NSTEMI (non-ST elevated myocardial infarction) ([place]) 05/2018   Echo EF 45-50% in context of NSTEMI from CA spasm.// Myoview 8/21: EF 66, no ischemia, low risk   Osteoarthritis of left wrist  STT and [facility] jt   Osteoporosis 05/2010   2012 'penia; 06/2015 'porosis:  prolia.  DEXA 09/2017 T-score -2.2 femur neck. 11/2021 T score -2.2. Rpt 2 yrs   Pelvic floor dysfunction    [facility] ([name])   Pneumonia    Postmenopausal vaginal bleeding 05/2017   x 1 small episode; GYN attempted endo bx but unable to penetrate cervix due to severe cerv stenosis (due to postmenopausal state + hx of LEEP).  Endo u/s showed thin endo lining.  Per GYN---no evidence of endomet pathology on w/u---obs as of 07/2017.   Prediabetes 06/2016   Fasting gluc 105; HbA1c at that time was 6.0%.  A1c 6.2% 12/2017.  A1c 5.7% Feb 2023.   Recurrent kidney stones 08/2013   Left ureteral calculus: Perc nephr--cystoscopy w/ureteroscopy + laser for stone removal.  Residual  asymptomatic left renal nephrolithiasis <52m present post-procedure.  Right sided hydronephrosis-persistent (on u/s)--urol ordered CT to further eval 08/2016: 1.6 cm nonobstructing stone lower pole left kidney, no hydro, plan for PCN extraction ([facility])   Shortness of breath    Sprain of neck 12/19/2013   Stroke (HCC)    TIA   TMJ (temporomandibular joint disorder)    USES  MOUTH GUARD   Toe fracture, left 12/2017   2nd toe prox phalanx (immobilization, Dr. RZadie Rhine   Tubular adenoma of colon    Vitamin D deficiency 05/2011   Weak urinary stream 07/2016   with elevated PVR and mild left hydronephrosis secondary to this ([facility] started flomax 0.'4mg'$  qd for this).   Past Surgical History:  Procedure Laterality Date   ABIs  11/29/2019   NORMAL   ANTERIOR CERVICAL DECOMP/DISCECTOMY FUSION  06/30/2009   C3 -- C5  AND EXPLORATION OF FUSION C5-7 W/  PLATE REMOVAL   ARTHRODESIS METATARSALPHALANGEAL JOINT (MTPJ) Left 07/08/2021   Procedure: Left hallux metatarsophalangeal joint arthrodesis;  Surgeon: HWylene Simmer MD;  [facility]  Service: Orthopedics;  Laterality: Left;   BACK SURGERY     BUNIONECTOMY Left    CARDIOVASCULAR STRESS TEST  12/03/2019   myoc perf im: NORMAL   CERVICAL FUSION  2002   anterior C5 -- C7   CERVICAL SPINE SURGERY  08/27/1999     C6 -- T1  LAMINECTOMY/  DISKECTOMY   CESAREAN SECTION  1994   CHOLECYSTECTOMY N/A 03/06/2020   Procedure: LAPAROSCOPIC CHOLECYSTECTOMY;  Surgeon: SFelicie Morn MD;  [facility] [facility]  Service: General;  Laterality: N/A;   COLONOSCOPY W/ POLYPECTOMY  04/2008;08/2014   2016 tubular adenoma x 1; +diverticulosis and int/ext hemorrhoids.  06/2020 adenoma, recall 5 yrs.   CYSTOSCOPY WITH URETEROSCOPY AND STENT PLACEMENT Left 08/28/2013   Procedure: CYSTOSCOPY, RGP,  WITH URETEROSCOPY AND STENT REMOVAL;  Surgeon: DBernestine Amass MD;  [facility]  Service: Urology;  Laterality: Left;    DEXA  09/2017   T-score -2.2 femur neck: improved compared to 06/2015.  2021 DEXA T score -2.3.  11/2021 DEXA T score -2.2.   ESOPHAGOGASTRODUODENOSCOPY  2010; 08/2014   2016 +Candidal esophagitis; Mild chronic gastritis w/intestinal metaplasia--NEG H pylori, neg for eosinophilic esoph. 36/0454esoph candidiasis (diflucan x 20d), o/w normal.   HAMMER TOE SURGERY Left 07/08/2021   Procedure: Second metatarsal head excision; revision Second hammertoe correction; 2-5 percutaneus flexor tenotomies;  Surgeon: HWylene Simmer MD;  [place] [facility]  Service: Orthopedics;  Laterality: Left;   HARDWARE REMOVAL Left 07/08/2021   Procedure: Removal of deep implants from  hallux proximal phalanx and First and Second metatarsals;  Surgeon: Wylene Simmer, MD;  Location: [place];  Service: Orthopedics;  Laterality: Left;   HOLMIUM LASER APPLICATION Left 84/16/6063   Procedure: HOLMIUM LASER APPLICATION;  Surgeon: [name], MD;  Location: [facility];  Service: Urology;  Laterality: Left;   JOINT REPLACEMENT     LEFT HEART CATH AND CORONARY ANGIOGRAPHY N/A 05/15/2018   Evidence for spasm in distal LAD.  Mod RCA atherosclerosis, o/w no CAD.  Procedure: LEFT HEART CATH AND CORONARY ANGIOGRAPHY;  Surgeon: Leonie Man, MD;  Location: [place] [facility];  Service: Cardiovascular;  Laterality: N/A;   NEGATIVE SLEEP STUDY  04/01/2007   NEPHROLITHOTOMY Left 08/05/2013   Procedure: NEPHROLITHOTOMY PERCUTANEOUS;  Surgeon: [name], MD;  Location: [facility];  Service: Urology;  Laterality: Left;   POLYSOMNOGRAM  01/2019   NORMAL   POLYSOMNOGRAPHY  10/25/2018   Dr. Dion Saucier due to pt inadequat sleep time (83 min).   TONSILLECTOMY AND ADENOIDECTOMY  1975   TOTAL KNEE ARTHROPLASTY Right 2003   TRANSTHORACIC ECHOCARDIOGRAM  05/2018; 12/03/2019   (after MI secondary to arterial spasm): EF 45-50%; severe hypokinesis of apical anterolateral,  inferolateral , anterior, and inferior LV myocardium. 11/2019 EF 55-60%, valves fine   ULTRASOUND EXAM,PELVIC COMPLETE ([place] [facility])  06/25/2015   NORMAL (done by GYN)   Patient Active Problem List   Diagnosis Date Noted   Shortness of breath 04/19/2019   Healthcare maintenance 04/19/2019   Overweight (BMI 25.0-29.9) 02/19/2019   Ischemic cardiomyopathy 09/03/2018   Chest pain 07/11/2018   Coronary vasospasm ([place]) 07/11/2018   NSTEMI (non-ST elevated myocardial infarction) ([place]) 05/14/2018   Acute encephalopathy 11/19/2017   Hyponatremia 11/16/2017   Bipolar 1 disorder ([place]) 12/29/2016   Hypertension 12/29/2016   TIA (transient ischemic attack) 12/29/2016   Chronic pelvic pain in female 08/22/2016   Stage 2 chronic kidney disease 08/22/2016   Osteoporosis 09/30/2015   Health maintenance examination 06/11/2015   Suprapubic discomfort 06/05/2015   Urethral caruncle 06/05/2015   Infection of urinary tract 06/05/2015   TMJ (temporomandibular joint disorder) 02/25/2015   Right ankle injury 11/05/2014   Cervical spondylosis without myelopathy 12/19/2013   Sprain of neck 12/19/2013   Memory difficulties 12/19/2013   Postnasal drip 10/28/2013   Renal calculus 08/05/2013   Urinary frequency 08/01/2013   Constipation, chronic 07/29/2013   COPD (chronic obstructive pulmonary disease) ([place]) 06/30/2013   Abnormal chest x-ray 06/30/2013   Benign essential HTN 05/29/2013   Kidney stone on left side 05/29/2013   Hyperlipidemia 05/29/2013    PCP: Tammi Sou, MD  REFERRING PROVIDER:  foot: Corky Sing, PA-C     Neck: Phlip McGowen  REFERRING DIAG: M20.22 (ICD-10-CM) - Hallux rigidus, left foot  Procedure(s): 1.  Left first metatarsal removal of deep implant 2.  Left hallux proximal phalanx removal of deep implant 3.  Left hallux metatarsophalangeal joint arthrodesis 4.  Removal of deep implant from the left second metatarsal 5.  Left second metatarsal head excision 6.   Left second hammertoe correction 7.  Left third, fourth and fifth toe flexor digitorum longus tenotomies 8.  AP, lateral and oblique radiographs of the left foot  THERAPY DIAG:  Stiffness of left foot, not elsewhere classified  Pain in left toe(s)  Difficulty walking  Cervicalgia  Rationale for Evaluation and Treatment Rehabilitation  ONSET DATE: 07/08/2021 Surgery  SUBJECTIVE:   SUBJECTIVE STATEMENT: 12/02/2021  Had cataract surgery. Doing ok with  that. Feet are better.  Neck still sore at times, near baseline, now feeling better with medication/mobic. She is having knee surgery in October. Feels main complaint at this time in instability/balance.    Eval: Pt states that she has L foot pain and swelling since having surgery. Pain is the top of the foot and lateral. Pt has pain all the time walking, big toe mostly with walking. Putting her shoe on it hurts. She states her current shoes are too small and old. Pt has pain with any walking. Pushing off on the foot hurts. Pt denies red flag symptoms. Pt states she mainly sedentary due to the foot pain.   PERTINENT HISTORY: C/S surgery, Stroke, TIA, Hallux deformity+adhesive capsulitis+ hammertoe:  Severe structural bunion deformity with hallux interphalangeus and severe arthritis of the second MPJ left foot--surgical repair/osteotomies by [name] 04/2017.  PAIN:  Are you having pain? Yes: NPRS scale: 3/10 Pain location: L big toe Pain description: throbbing, sharp Aggravating factors: any movement Relieving factors: resting   Are you having pain? Yes: NPRS scale: 3/10 Pain location: L  neck, Radiating into L side of head Pain description: numb, painful Aggravating factors:  Relieving factors:   PRECAUTIONS: None  WEIGHT BEARING RESTRICTIONS No  FALLS:  Has patient fallen in last 6 months? No  LIVING ENVIRONMENT: Lives with: lives alone Lives in: House/apartment Stairs: 1st level Has following equipment at home:  None  OCCUPATION: N/A  PLOF: Independent  PATIENT GOALS : Pt would like to get more mobile and improve walking.    OBJECTIVE:   DIAGNOSTIC FINDINGS: N/A   COGNITION:  Overall cognitive status: Within functional limits for tasks assessed     SENSATION: Light touch: Impaired   EDEMA:  Moderate edema between 2nd and 3rd digit, non pitting   POSTURE: increased thoracic kyphosis, flexed trunk , and weight shift right, pes planus  PALPATION:    LOWER EXTREMITY ROM:  MMT Right eval Left eval  Ankle dorsiflexion 10 3  Ankle plantarflexion [facility]  Ankle inversion 25 20  Ankle eversion 20 10   (Blank rows = not tested)  Great toe extension on L: 5 deg  10/26/21:   Cervical ROM:  L rot: 35,  R rot 60, Flex: mod limitation, Ext: mod/significant limitaiton.   12/02/21: Cervical ROM:  L rot: 52,  R rot 60, Flex: min limitation, Ext: mod limitation.    LOWER EXTREMITY MMT:  Passive ROM Right eval Left eval L 12/02/21    Ankle dorsiflexion 4+/5 4/5 p! 4+    Ankle plantarflexion 4+/5 4/5 p! 4+    Ankle inversion 4+/5 4-/5 p! 4    Ankle eversion 4+/5 4-/5 p! 4     (Blank rows = not tested)   FUNCTIONAL TESTS:   GAIT: Distance walked: 158f Assistive device utilized: None Level of assistance: Complete Independence Comments: mild instability, decreased TKE in R knee.   TODAY'S TREATMENT: 12/02/2021 Therapeutic Exercise: Aerobic: Supine:  S/L:  Hip abd x10 on R, clams x 10 on R;  Seated:   sit to stand x10;  LAQ x 10 bil; Cervical rotation to L with self overpressure (light) x 15;  Standing:     Stretches:  Neuromuscular Re-education:Stride stance weight shifts x 20 bil; Slow march x 20; Tandem stance 30 sec x 2 bil; Toe taps 6 in step x 20, no UE support;  Manual Therapy:     PATIENT EDUCATION:  Education details: updated and reviewed HEP, POC.  Person educated: Patient  Education method: Explanation, Demonstration, Tactile cues, Verbal cues, and  Handouts Education comprehension: verbalized understanding, returned demonstration, verbal cues required, and tactile cues required   HOME EXERCISE PROGRAM:  Access Code: [email or web address]   ASSESSMENT:  CLINICAL IMPRESSION: 12/02/2021 PN: Pt has been seen for 7 visits. Initially for foot pain, which is now improved. Then focus was on neck due to fall, which is now also improved. She has mild soreness in neck, but states this has been her baseline. Seems that flare from fall has calmed down. She has improved cervical ROM upon testing today, still with mild loss of L rotation. She continues to have poor stability in standing and with gait and dynamic activity. Some likely due to her foot positioning. We have started to work on this some, and pt will benefit from continued work on LE strength and stability, leading up to knee surgery that is scheduled in October. Pt to benefit from continued care, with focus on standing stability. Pt in agreement with plan.     OBJECTIVE IMPAIRMENTS Abnormal gait, decreased activity tolerance, decreased balance, decreased coordination, decreased endurance, decreased knowledge of condition, decreased mobility, difficulty walking, decreased ROM, decreased strength, hypomobility, impaired flexibility, impaired sensation, improper body mechanics, postural dysfunction, and pain.   ACTIVITY LIMITATIONS carrying, lifting, standing, squatting, stairs, transfers, and locomotion level  PARTICIPATION LIMITATIONS: meal prep, cleaning, laundry, interpersonal relationship, driving, shopping, community activity, yard work, and exercise  PERSONAL FACTORS Age, Behavior pattern, Fitness, Past/current experiences, Time since onset of injury/illness/exacerbation, and 3+ comorbidities:    are also affecting patient's functional outcome.   REHAB POTENTIAL: Fair    CLINICAL DECISION MAKING: Evolving/moderate complexity  EVALUATION COMPLEXITY: Moderate   GOALS:   SHORT TERM GOALS:  Target date: 11/04/2021   Pt will become independent with HEP in order to demonstrate synthesis of PT education.   Goal status: INITIAL  2.  Pt will report at least 2 pt reduction on NPRS scale for pain in order to demonstrate functional improvement with household activity, self care, and ADL.   Goal status: INITIAL  3.  Pt will score at least 19 pt increase on FOTO to demonstrate functional improvement in MCII and pt perceived function.    Goal status: INITIAL    LONG TERM GOALS: Target date: 12/16/2021   Pt  will become independent with final HEP in order to demonstrate synthesis of PT education.   Goal status: INITIAL  2.  Pt will score >/= 41 on FOTO to demonstrate improvement in perceived L foot function.   Goal status: INITIAL  3.  Pt will be able to demonstrate/report ability to walk >15 mins without pain in order to demonstrate functional improvement and tolerance to exercise and community mobility.   Goal status: INITIAL  4.  Pt will be able to perform 5XSTS in under 12s  in order to demonstrate functional improvement above the cut off score for adults.   Goal status: INITIAL    5.  Pt to report decreased pain in neck, to 0-2/10 with activity, sleeping, and neck ROM.     Goal status: INITIAL/added 10/26/21       PLAN: PT FREQUENCY: 1-2x/week  PT DURATION: 12 weeks  PLANNED INTERVENTIONS: Therapeutic exercises, Therapeutic activity, Neuromuscular re-education, Balance training, Gait training, Patient/Family education, Joint manipulation, Joint mobilization, Stair training, Orthotic/Fit training, DME instructions, Aquatic Therapy, Dry Needling, Electrical stimulation, Spinal manipulation, Spinal mobilization, Moist heat, scar mobilization, Splintting, Taping, Vasopneumatic device, Traction, Ultrasound, Ionotophoresis '4mg'$ /ml Dexamethasone, Manual therapy, and Re-evaluation  PLAN  FOR NEXT SESSION: standing balance, gait, LE strength, manual for neck pain, postural  strength    Lyndee Hensen, PT, DPT 12:15 PM  12/02/21

## 2021-12-03 DIAGNOSIS — H2512 Age-related nuclear cataract, left eye: Secondary | ICD-10-CM | POA: Diagnosis not present

## 2021-12-03 LAB — BASIC METABOLIC PANEL
BUN: 18 mg/dL (ref 6–23)
CO2: 29 mEq/L (ref 19–32)
Calcium: 9.3 mg/dL (ref 8.4–10.5)
Chloride: 99 mEq/L (ref 96–112)
Creatinine, Ser: 0.98 mg/dL (ref 0.40–1.20)
GFR: 59.13 mL/min — ABNORMAL LOW (ref >60.00)
Glucose, Bld: 126 mg/dL — ABNORMAL HIGH (ref 70–99)
Potassium: 3.6 mEq/L (ref 3.5–5.1)
Sodium: 136 mEq/L (ref 135–145)

## 2021-12-10 ENCOUNTER — Ambulatory Visit: Payer: Medicare Other | Attending: Cardiovascular Disease | Admitting: Physician Assistant

## 2021-12-10 DIAGNOSIS — Z01810 Encounter for preprocedural cardiovascular examination: Secondary | ICD-10-CM | POA: Insufficient documentation

## 2021-12-10 DIAGNOSIS — H2512 Age-related nuclear cataract, left eye: Secondary | ICD-10-CM | POA: Diagnosis not present

## 2021-12-10 NOTE — Progress Notes (Signed)
Virtual Visit via Telephone Note   Because of Erin Lopez's co-morbid illnesses, she is at least at moderate risk for complications without adequate follow up.  This format is felt to be most appropriate for this patient at this time.  The patient did not have access to video technology/had technical difficulties with video requiring transitioning to audio format only (telephone).  All issues noted in this document were discussed and addressed.  No physical exam could be performed with this format.  Please refer to the patient's chart for her consent to telehealth for [facility].  Evaluation Performed:  Preoperative cardiovascular risk assessment _____________   Date:  12/10/2021   Patient ID:  Erin Lopez, DOB [birth year], MRN [id number] Patient Location:  Home Provider location:   Office  Primary Care Provider:  Tammi Sou, MD Primary Cardiologist:  Fransico Him, MD  Chief Complaint / Patient Profile   71 y.o. y/o female with a h/o ASCAD with NSTEMI 05/2018 with cardiac catheterization showing coronary artery vasospasm with distal LAD lesion at 60% and proximal to mid RCA lesion 45%, hypertension, hyperlipidemia, COPD, and GERD who is pending right knee poly versus total knee revision and presents today for telephonic preoperative cardiovascular risk assessment.  Past Medical History    Past Medical History:  Diagnosis Date   Alcoholism in remission [facility])    states last alcohol 11 yrs ago   Anemia    Anxiety    Bipolar 1 disorder ([place])    Admission for mania x 2 (most recent 11/2017)   Cervical spondylosis without myelopathy 12/19/2013   MRI 02/2014: multilevel DDD/spondylosis, not much change compared to prior MRI.  Pt not in favor of invasive therapy for her neck as of 04/2014.   Cholelithiasis    COPD (chronic obstructive pulmonary disease) (HCC)    Moderate: anoro started 04/2017 by pulm, pt symptomatically improved and PFTs stable at f/u 06/2017.    Coronary artery spasm ([place]) 07/11/2018   nitrates=HA. Amlodipine=intol swelling.  Changed to coreg 09/03/18 by cardiologist.   Depression    Dilutional hyponatremia    Endometritis 05/2017   possible; empiric tx with Flagyl by GYN.   Environmental allergies    Fibromyalgia    GERD (gastroesophageal reflux disease)    Hepatic steatosis 03/2019   u/s abd   Herpes zoster 08/07/2017   L scapular region   Hiatal hernia    History of adenomatous polyp of colon 04/2008; 08/2014   No high grade dysplasia: recall 5 yrs ([name], [name])   History of basal cell carcinoma excision    NOSE   History of Helicobacter pylori infection 04/2008   +gastric biopsy (gastritis but no metaplasia, dysplasia, or malignancy identified)   History of kidney stones    History of TIA (transient ischemic attack)    secondary to HELLP syndrome 1994 post c/s--  residual memory loss   Hyperlipidemia    statin started after her NSTEMI: goal  LDL <70.   Hypertension    Internal hemorrhoids    Ischemic cardiomyopathy 05/2018   Echo EF 45-50% in context of NSTEMI from CA spasm. 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 04/2017.   Leg pain    ABIs 11/2019: normal    Memory loss  Abnl MRI brain and CT brain c/w chronic microvascular ischemia.  Repeat MRI brain 02/2014 stable (Dr. Mayra Reel with no plan to f/u with neuro as of 04/2014)   NSTEMI (non-ST elevated myocardial infarction) ([place]) 05/2018   Echo EF 45-50% in context of NSTEMI from CA spasm.// Myoview 8/21: EF 66, no ischemia, low risk   Osteoarthritis of left wrist    STT and 1st [place] jt   Osteoporosis 05/2010   2012 'penia; 06/2015 'porosis:  prolia.  DEXA 09/2017 T-score -2.2 femur neck. 11/2021 T score  -2.2. Rpt 2 yrs   Pelvic floor dysfunction    [facility] ([name])   Pneumonia    Postmenopausal vaginal bleeding 05/2017   x 1 small episode; GYN attempted endo bx but unable to penetrate cervix due to severe cerv stenosis (due to postmenopausal state + hx of LEEP).  Endo u/s showed thin endo lining.  Per GYN---no evidence of endomet pathology on w/u---obs as of 07/2017.   Prediabetes 06/2016   Fasting gluc 105; HbA1c at that time was 6.0%.  A1c 6.2% 12/2017.  A1c 5.7% Feb 2023.   Recurrent kidney stones 08/2013   Left ureteral calculus: Perc nephr--cystoscopy w/ureteroscopy + laser for stone removal.  Residual asymptomatic left renal nephrolithiasis <66m present post-procedure.  Right sided hydronephrosis-persistent (on u/s)--urol ordered CT to further eval 08/2016: 1.6 cm nonobstructing stone lower pole left kidney, no hydro, plan for PCN extraction ([facility])   Shortness of breath    Sprain of neck 12/19/2013   Stroke (HCC)    TIA   TMJ (temporomandibular joint disorder)    USES  MOUTH GUARD   Toe fracture, left 12/2017   2nd toe prox phalanx (immobilization, Dr. RZadie Rhine   Tubular adenoma of colon    Vitamin D deficiency 05/2011   Weak urinary stream 07/2016   with elevated PVR and mild left hydronephrosis secondary to this ([facility] started flomax 0.'4mg'$  qd for this).   Past Surgical History:  Procedure Laterality Date   ABIs  11/29/2019   NORMAL   ANTERIOR CERVICAL DECOMP/DISCECTOMY FUSION  06/30/2009   C3 -- C5  AND EXPLORATION OF FUSION C5-7 W/  PLATE REMOVAL   ARTHRODESIS METATARSALPHALANGEAL JOINT (MTPJ) Left 07/08/2021   Procedure: Left hallux metatarsophalangeal joint arthrodesis;  Surgeon: HWylene Simmer MD;  [place]  Service: Orthopedics;  Laterality: Left;   BACK SURGERY     BUNIONECTOMY Left    CARDIOVASCULAR STRESS TEST  12/03/2019   myoc perf im: NORMAL   CERVICAL FUSION  2002   anterior C5 -- C7   CERVICAL SPINE SURGERY   08/27/1999     C6 -- T1  LAMINECTOMY/  DISKECTOMY   CESAREAN SECTION  1994   CHOLECYSTECTOMY N/A 03/06/2020   Procedure: LAPAROSCOPIC CHOLECYSTECTOMY;  Surgeon: SFelicie Morn MD;  [place]  Service: General;  Laterality: N/A;   COLONOSCOPY W/ POLYPECTOMY  04/2008;08/2014   2016 tubular adenoma x 1; +diverticulosis and int/ext hemorrhoids.  06/2020 adenoma, recall 5 yrs.   CYSTOSCOPY WITH URETEROSCOPY AND STENT PLACEMENT Left 08/28/2013   Procedure: CYSTOSCOPY, RGP,  WITH URETEROSCOPY AND STENT REMOVAL;  Surgeon: DBernestine Amass MD;  [facility]  Service: Urology;  Laterality: Left;   DEXA  09/2017   T-score -2.2 femur neck: improved compared to 06/2015.  2021 DEXA T score -2.3.  11/2021 DEXA T score -2.2.   ESOPHAGOGASTRODUODENOSCOPY  2010; 08/2014   2016 +Candidal esophagitis; Mild chronic gastritis w/intestinal metaplasia--NEG H  pylori, neg for eosinophilic esoph. 07/6801 esoph candidiasis (diflucan x 20d), o/w normal.   HAMMER TOE SURGERY Left 07/08/2021   Procedure: Second metatarsal head excision; revision Second hammertoe correction; 2-5 percutaneus flexor tenotomies;  Surgeon: Wylene Simmer, MD;  Location: [place];  Service: Orthopedics;  Laterality: Left;   HARDWARE REMOVAL Left 07/08/2021   Procedure: Removal of deep implants from hallux proximal phalanx and First and Second metatarsals;  Surgeon: Wylene Simmer, MD;  Location: [place];  Service: Orthopedics;  Laterality: Left;   HOLMIUM LASER APPLICATION Left 21/22/4825   Procedure: HOLMIUM LASER APPLICATION;  Surgeon: [name], MD;  [facility];  Service: Urology;  Laterality: Left;   JOINT REPLACEMENT     LEFT HEART CATH AND CORONARY ANGIOGRAPHY N/A 05/15/2018   Evidence for spasm in distal LAD.  Mod RCA atherosclerosis, o/w no CAD.  Procedure: LEFT HEART CATH AND CORONARY ANGIOGRAPHY;  Surgeon: Leonie Man, MD;  Location: [place] [facility];  Service: Cardiovascular;  Laterality: N/A;   NEGATIVE SLEEP STUDY  04/01/2007   NEPHROLITHOTOMY Left 08/05/2013   Procedure: NEPHROLITHOTOMY PERCUTANEOUS;  Surgeon: [name], MD;  Location: [facility];  Service: Urology;  Laterality: Left;   POLYSOMNOGRAM  01/2019   NORMAL   POLYSOMNOGRAPHY  10/25/2018   Dr. Dion Saucier due to pt inadequat sleep time (83 min).   TONSILLECTOMY AND ADENOIDECTOMY  1975   TOTAL KNEE ARTHROPLASTY Right 2003   TRANSTHORACIC ECHOCARDIOGRAM  05/2018; 12/03/2019   (after MI secondary to arterial spasm): EF 45-50%; severe hypokinesis of apical anterolateral, inferolateral , anterior, and inferior LV myocardium. 11/2019 EF 55-60%, valves fine   ULTRASOUND EXAM,PELVIC COMPLETE ([facility] HX)  06/25/2015   NORMAL (done by GYN)    Allergies  Allergies  Allergen Reactions   Ceftin [Cefuroxime Axetil] Anaphylaxis    Swelling of eyes and tongue   Ciprofloxacin Anaphylaxis    Difficulty breathing and generalized swelling   Risperdal [Risperidone] Palpitations    Generalized weakness/malaise/palpitations/inc bp   Fosamax [Alendronate Sodium] Diarrhea   Morphine And Related Other (See Comments)    Hallucinations/ disoriented - pt stated, "Only Morphine" - 03/23/17   Oxycodone Other (See Comments)    Patient was "hooked" on medication.   Prednisone Other (See Comments)    Hyperactivity   Seroquel [Quetiapine] Other (See Comments)    oversedation    History of Present Illness    Erin Lopez is a 71 y.o. female who presents via audio/video conferencing for a telehealth visit today.  Pt was last seen in cardiology clinic on 06/23/2021 by Dr. Fransico Him.  At that time CHERRE KOTHARI was doing well other than her chronic DOE that is been stable.  The patient is now pending procedure as outlined above. Since her last visit, she states she has been doing well other than some medication issues that she was started on by her  primary.  She said it got her heart racing.  This has been added to her allergy list.  Otherwise, she has a new kitten which keeps her busy as well as watching her daughter's dog.  She tells me her blood pressure has been good.  She has no issues with indoor tasks, walking, or climbing a flight of stairs.  For this reason she scored a 5.07 METS on the DASI.  This exceeds the 4 METS minimum requirement.  Patient is on aspirin for CAD.  Per office protocol, can hold aspirin 7  days prior to procedure.  Please restart when medically safe to do so.   Home Medications    Prior to Admission medications   Medication Sig Start Date End Date Taking? Authorizing Provider  acetaminophen (TYLENOL) 500 MG tablet Take 1,000 mg by mouth every 6 (six) hours as needed for mild pain or headache.     [provider]  albuterol (VENTOLIN HFA) 108 (90 Base) MCG/ACT inhaler INHALE 2 PUFFS INTO THE LUNGS EVERY 4 HOURS AS NEEDED FOR WHEEZE OR FOR SHORTNESS OF BREATH 07/09/20   McGowen, Adrian Blackwater, MD  ALPRAZolam Duanne Moron) 0.5 MG tablet TAKE 1 TABLET BY MOUTH THREE TIMES A DAY AS NEEDED FOR ANXIETY 09/23/21   McGowen, Adrian Blackwater, MD  aspirin EC 81 MG EC tablet Take 1 tablet (81 mg total) by mouth daily. 05/17/18   [facility], Tami Lin, PA  atorvastatin (LIPITOR) 80 MG tablet TAKE 1 TABLET BY MOUTH DAILY AT 6 PM. 08/17/21   McGowen, Adrian Blackwater, MD  BESIVANCE 0.6 % SUSP Place 1 drop into the right eye 3 (three) times daily. Patient not taking: Reported on 12/02/2021 08/27/21   [provider]  buPROPion (WELLBUTRIN XL) 150 MG 24 hr tablet Take 1 tablet (150 mg total) by mouth daily. 12/02/21   McGowen, Adrian Blackwater, MD  citalopram (CELEXA) 20 MG tablet TAKE 1 TABLET (20 MG TOTAL) BY MOUTH IN THE MORNING AND AT BEDTIME 11/26/21   McGowen, Adrian Blackwater, MD  cyclobenzaprine (FLEXERIL) 10 MG tablet TAKE 1 TABLET BY MOUTH TWICE A DAY 11/26/21   McGowen, Adrian Blackwater, MD  doxycycline (VIBRAMYCIN) 50 MG capsule Take 50 mg by mouth daily.  01/09/20   [provider]  famotidine (PEPCID) 40 MG tablet TAKE 1 TABLET BY MOUTH EVERY DAY 08/17/21   McGowen, Adrian Blackwater, MD  fluticasone (FLONASE) 50 MCG/ACT nasal spray SPRAY 2 SPRAYS INTO EACH NOSTRIL EVERY DAY 07/05/21   McGowen, Adrian Blackwater, MD  hydrochlorothiazide (HYDRODIURIL) 25 MG tablet Take 1 tablet (25 mg total) by mouth daily. Patient taking differently: Take 12.5 mg by mouth daily. 11/01/21   McGowen, Adrian Blackwater, MD  ipratropium-albuterol (DUONEB) 0.5-2.5 (3) MG/3ML SOLN TAKE 3 MLS BY NEBULIZATION 3 (THREE) TIMES DAILY AS NEEDED. 07/13/20   Mannam, Hart Robinsons, MD  lamoTRIgine (LAMICTAL) 100 MG tablet Take 3 tablets (300 mg total) by mouth at bedtime. 05/06/21   McGowen, Adrian Blackwater, MD  losartan (COZAAR) 50 MG tablet Take 1 tablet (50 mg total) by mouth daily. 11/12/21   McGowen, Adrian Blackwater, MD  Magnesium 250 MG TABS Take 250 mg by mouth daily.     [provider]  meloxicam (MOBIC) 15 MG tablet TAKE 1 TABLET (15 MG TOTAL) BY MOUTH DAILY. 11/26/21   McGowen, Adrian Blackwater, MD  metoprolol succinate (TOPROL-XL) 100 MG 24 hr tablet Take 1 tablet (100 mg total) by mouth daily. Take with or immediately following a meal. 12/30/20   Turner, Eber Hong, MD  montelukast (SINGULAIR) 10 MG tablet TAKE 1 TABLET BY MOUTH EVERY DAY IN THE EVENING 06/22/21   McGowen, Adrian Blackwater, MD  Multiple Vitamin (MULTI-VITAMINS) TABS Take 1 tablet by mouth daily.     [provider]  nitroGLYCERIN (NITROSTAT) 0.4 MG SL tablet Place 1 tablet (0.4 mg total) under the tongue every 5 (five) minutes x 3 doses as needed for chest pain. Patient not taking: Reported on 11/10/2021 11/01/21   Tammi Sou, MD  oxyCODONE (OXY IR/ROXICODONE) 5 MG immediate release tablet 1 tab po q6h prn pain  11/01/21   McGowen, Adrian Blackwater, MD  pantoprazole (PROTONIX) 40 MG tablet Take 1 tablet (40 mg total) by mouth daily. Please schedule a yearly follow up for further refills. Thank you. 11/24/21   Pyrtle, Lajuan Lines, MD  polyethylene glycol powder  (GLYCOLAX/MIRALAX) powder TAKE 17 G BY MOUTH DAILY. 06/19/17   McGowen, Adrian Blackwater, MD  potassium chloride SA (KLOR-CON M) 20 MEQ tablet Take 1 tablet (20 mEq total) by mouth daily. 11/25/21   McGowen, Adrian Blackwater, MD  prednisoLONE acetate (PRED FORTE) 1 % ophthalmic suspension Place 1 drop into the right eye 4 (four) times daily. 08/27/21   [provider]  PROLENSA 0.07 % SOLN Place 1 drop into the right eye at bedtime. 08/27/21   [provider]  Propylene Glycol 0.6 % SOLN Apply 1 drop to eye 2 (two) times daily.    [provider]  tamsulosin (FLOMAX) 0.4 MG CAPS capsule Take 0.4 mg by mouth daily. 08/16/20   [provider]  Tiotropium Bromide-Olodaterol (STIOLTO RESPIMAT) 2.5-2.5 MCG/ACT AERS INHALE 2 PUFFS BY MOUTH INTO THE LUNGS DAILY 11/29/21   Mannam, Praveen, MD  triamcinolone cream (KENALOG) 0.1 % Apply 1 application topically daily. 05/21/18   [provider]  zolpidem (AMBIEN) 10 MG tablet TAKE 1 TABLET BY MOUTH EVERYDAY AT BEDTIME 05/06/21   McGowen, Adrian Blackwater, MD    Physical Exam    Vital Signs:  PRAJNA VANDERPOOL does not have vital signs available for review today.  BP has been "good"   Given telephonic nature of communication, physical exam is limited. AAOx3. NAD. Normal affect.  Speech and respirations are unlabored.  Accessory Clinical Findings    None  Assessment & Plan    1.  Preoperative Cardiovascular Risk Assessment:  Ms. Laughman perioperative risk of a major cardiac event is 0.9% according to the Revised Cardiac Risk Index (RCRI).  Therefore, she is at low risk for perioperative complications.   Her functional capacity is good at 5.07 METs according to the [facility] Activity Status Index (DASI). Recommendations: According to ACC/AHA guidelines, no further cardiovascular testing needed.  The patient may proceed to surgery at acceptable risk.   Antiplatelet and/or Anticoagulation Recommendations: Aspirin can be held for 7 days prior  to her surgery.  Please resume Aspirin post operatively when it is felt to be safe from a bleeding standpoint.   A copy of this note will be routed to requesting surgeon.  Time:   Today, I have spent 10 minutes with the patient with telehealth technology discussing medical history, symptoms, and management plan.     Elgie Collard, PA-C  12/10/2021, 9:53 AM

## 2021-12-15 ENCOUNTER — Other Ambulatory Visit: Payer: Self-pay | Admitting: Family Medicine

## 2021-12-15 ENCOUNTER — Other Ambulatory Visit: Payer: Self-pay | Admitting: Cardiology

## 2021-12-16 ENCOUNTER — Encounter: Payer: Self-pay | Admitting: Obstetrics & Gynecology

## 2021-12-16 ENCOUNTER — Ambulatory Visit (INDEPENDENT_AMBULATORY_CARE_PROVIDER_SITE_OTHER): Payer: Medicare Other

## 2021-12-16 ENCOUNTER — Ambulatory Visit (INDEPENDENT_AMBULATORY_CARE_PROVIDER_SITE_OTHER): Payer: Medicare Other | Admitting: Obstetrics & Gynecology

## 2021-12-16 VITALS — BP 108/64 | Resp 16

## 2021-12-16 DIAGNOSIS — N950 Postmenopausal bleeding: Secondary | ICD-10-CM | POA: Diagnosis not present

## 2021-12-16 NOTE — Progress Notes (Unsigned)
    Erin Lopez [birth year] [id number]        71 y.o.  [id number]   RP: PMB for Pelvic US  HPI: Postmenopause, well on no HRT.  Had very mild PMB, brownish discharge for 3 weeks at the end of July/early August 2023.  No vaginal bleeding/discharge since then.  No pelvic pain. Abstinent.   OB History  Gravida Para Term Preterm AB Living  3 1 0 '1 2 1  '$ SAB IAB Ectopic Multiple Live Births  2            # Outcome Date GA Lbr Len/2nd Weight Sex Delivery Anes PTL Lv  3 Preterm           2 SAB           1 SAB             Past medical history,surgical history, problem list, medications, allergies, family history and social history were all reviewed and documented in the EPIC chart.   Directed ROS with pertinent positives and negatives documented in the history of present illness/assessment and plan.  Exam:  Vitals:   12/16/21 1209  BP: 108/64  Resp: 16   General appearance:  Normal  Pelvic US today: Comparison is made with previous scan March 2019.  T/V images.  Small anteverted uterus measured at 3.63 x 2.77 x 1.57 cm.  Small amount of fluid again seen within the cavity.  Thin walls with a combined thickness measured at 1.18 mm.  2 small echogenic foci are again seen along the cavity wall, the largest measured at 2 mm.  The structures are unchanged in size and appearance since the previous scan.  Both ovaries are small with atrophic appearance.  No adnexal mass.  No free fluid in the pelvis.   Assessment/Plan:  71 y.o. [id number]   1. Postmenopausal bleeding  Postmenopause, well on no HRT.  Had very mild PMB, brownish discharge for 3 weeks at the end of July/early August 2023.  No vaginal bleeding/discharge since then.  No pelvic pain. Abstinent. Pelvic US findings thoroughly reviewed with patient.  Princess Bruins MD, 12:14 PM 12/16/2021

## 2021-12-17 ENCOUNTER — Encounter: Payer: Self-pay | Admitting: Physical Therapy

## 2021-12-17 ENCOUNTER — Telehealth: Payer: Self-pay

## 2021-12-17 ENCOUNTER — Ambulatory Visit (INDEPENDENT_AMBULATORY_CARE_PROVIDER_SITE_OTHER): Payer: Medicare Other | Admitting: Physical Therapy

## 2021-12-17 DIAGNOSIS — M542 Cervicalgia: Secondary | ICD-10-CM

## 2021-12-17 DIAGNOSIS — M25675 Stiffness of left foot, not elsewhere classified: Secondary | ICD-10-CM | POA: Diagnosis not present

## 2021-12-17 DIAGNOSIS — R262 Difficulty in walking, not elsewhere classified: Secondary | ICD-10-CM

## 2021-12-17 DIAGNOSIS — M79675 Pain in left toe(s): Secondary | ICD-10-CM | POA: Diagnosis not present

## 2021-12-17 MED ORDER — BUPROPION HCL ER (XL) 150 MG PO TB24: 150.0000 mg | ORAL_TABLET | Freq: Every day | ORAL | 1 refills | Status: DC

## 2021-12-17 NOTE — Telephone Encounter (Signed)
Okay to refill Wellbutrin XL 150 mg daily, #30, 1 additional refill. Please call her pharmacy to authorize this.

## 2021-12-17 NOTE — Telephone Encounter (Signed)
Please advise 

## 2021-12-17 NOTE — Therapy (Signed)
OUTPATIENT PHYSICAL THERAPY LOWER EXTREMITY TREATMENT   Patient Name: Erin Lopez MRN: [id number] DOB:02-01-1951, 71 y.o., female Today's Date: 12/17/2021       PT End of Session - 12/17/21 1240     Visit Number 8    Number of Visits 13    Date for PT Re-Evaluation 12/22/21    Authorization Type [employer] MCR,   PN done at visit 7 .    PT Start Time 1235    PT Stop Time 1315    PT Time Calculation (min) 40 min    Equipment Utilized During Treatment Gait belt    Activity Tolerance Patient tolerated treatment well    Behavior During Therapy WFL for tasks assessed/performed                 Past Medical History:  Diagnosis Date   Alcoholism in remission [facility])    states last alcohol 11 yrs ago   Anemia    Anxiety    Bipolar 1 disorder ([place])    Admission for mania x 2 (most recent 11/2017)   Cervical spondylosis without myelopathy 12/19/2013   MRI 02/2014: multilevel DDD/spondylosis, not much change compared to prior MRI.  Pt not in favor of invasive therapy for her neck as of 04/2014.   Cholelithiasis    COPD (chronic obstructive pulmonary disease) (HCC)    Moderate: anoro started 04/2017 by pulm, pt symptomatically improved and PFTs stable at f/u 06/2017.   Coronary artery spasm ([place]) 07/11/2018   nitrates=HA. Amlodipine=intol swelling.  Changed to coreg 09/03/18 by cardiologist.   Depression    Dilutional hyponatremia    Endometritis 05/2017   possible; empiric tx with Flagyl by GYN.   Environmental allergies    Fibromyalgia    GERD (gastroesophageal reflux disease)    Hepatic steatosis 03/2019   u/s abd   Herpes zoster 08/07/2017   L scapular region   Hiatal hernia    History of adenomatous polyp of colon 04/2008; 08/2014   No high grade dysplasia: recall 5 yrs ([name], [name])   History of basal cell carcinoma excision    NOSE   History of Helicobacter pylori infection 04/2008   +gastric biopsy (gastritis but no metaplasia, dysplasia, or malignancy  identified)   History of kidney stones    History of TIA (transient ischemic attack)    secondary to HELLP syndrome 1994 post c/s--  residual memory loss   Hyperlipidemia    statin started after her NSTEMI: goal  LDL <70.   Hypertension    Internal hemorrhoids    Ischemic cardiomyopathy 05/2018   Echo EF 45-50% in context of NSTEMI from CA spasm. 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 04/2017.   Leg pain    ABIs 11/2019: normal    Memory loss    Abnl MRI brain and CT brain c/w chronic microvascular ischemia.  Repeat MRI brain 02/2014 stable (Dr. Mayra Reel with no plan to f/u with neuro as of 04/2014)   NSTEMI (non-ST elevated myocardial infarction) ([place]) 05/2018   Echo EF 45-50% in context of NSTEMI from CA spasm.// Myoview 8/21: EF 66, no ischemia, low risk   Osteoarthritis of left wrist    STT and 1st [place] jt   Osteoporosis 05/2010   2012 'penia; 06/2015 '  porosis:  prolia.  DEXA 09/2017 T-score -2.2 femur neck. 11/2021 T score -2.2. Rpt 2 yrs   Pelvic floor dysfunction    [facility] ([name])   Pneumonia    Postmenopausal vaginal bleeding 05/2017   x 1 small episode; GYN attempted endo bx but unable to penetrate cervix due to severe cerv stenosis (due to postmenopausal state + hx of LEEP).  Endo u/s showed thin endo lining.  Per GYN---no evidence of endomet pathology on w/u---obs as of 07/2017.   Prediabetes 06/2016   Fasting gluc 105; HbA1c at that time was 6.0%.  A1c 6.2% 12/2017.  A1c 5.7% Feb 2023.   Recurrent kidney stones 08/2013   Left ureteral calculus: Perc nephr--cystoscopy w/ureteroscopy + laser for stone removal.  Residual asymptomatic left renal nephrolithiasis <73m present post-procedure.  Right sided  hydronephrosis-persistent (on u/s)--urol ordered CT to further eval 08/2016: 1.6 cm nonobstructing stone lower pole left kidney, no hydro, plan for PCN extraction ([facility])   Shortness of breath    Sprain of neck 12/19/2013   Stroke (HCC)    TIA   TMJ (temporomandibular joint disorder)    USES  MOUTH GUARD   Toe fracture, left 12/2017   2nd toe prox phalanx (immobilization, Dr. RZadie Rhine   Tubular adenoma of colon    Vitamin D deficiency 05/2011   Weak urinary stream 07/2016   with elevated PVR and mild left hydronephrosis secondary to this ([facility] started flomax 0.'4mg'$  qd for this).   Past Surgical History:  Procedure Laterality Date   ABIs  11/29/2019   NORMAL   ANTERIOR CERVICAL DECOMP/DISCECTOMY FUSION  06/30/2009   C3 -- C5  AND EXPLORATION OF FUSION C5-7 W/  PLATE REMOVAL   ARTHRODESIS METATARSALPHALANGEAL JOINT (MTPJ) Left 07/08/2021   Procedure: Left hallux metatarsophalangeal joint arthrodesis;  Surgeon: HWylene Simmer MD;  [place]  Service: Orthopedics;  Laterality: Left;   BACK SURGERY     BUNIONECTOMY Left    CARDIOVASCULAR STRESS TEST  12/03/2019   myoc perf im: NORMAL   CERVICAL FUSION  2002   anterior C5 -- C7   CERVICAL SPINE SURGERY  08/27/1999     C6 -- T1  LAMINECTOMY/  DISKECTOMY   CESAREAN SECTION  1994   CHOLECYSTECTOMY N/A 03/06/2020   Procedure: LAPAROSCOPIC CHOLECYSTECTOMY;  Surgeon: SFelicie Morn MD;  [place]  Service: General;  Laterality: N/A;   COLONOSCOPY W/ POLYPECTOMY  04/2008;08/2014   2016 tubular adenoma x 1; +diverticulosis and int/ext hemorrhoids.  06/2020 adenoma, recall 5 yrs.   CYSTOSCOPY WITH URETEROSCOPY AND STENT PLACEMENT Left 08/28/2013   Procedure: CYSTOSCOPY, RGP,  WITH URETEROSCOPY AND STENT REMOVAL;  Surgeon: DBernestine Amass MD;  [facility]  Service: Urology;  Laterality: Left;   DEXA  09/2017   T-score -2.2 femur neck: improved compared to 06/2015.  2021 DEXA  T score -2.3.  11/2021 DEXA T score -2.2.   ESOPHAGOGASTRODUODENOSCOPY  2010; 08/2014   2016 +Candidal esophagitis; Mild chronic gastritis w/intestinal metaplasia--NEG H pylori, neg for eosinophilic esoph. 36/2952esoph candidiasis (diflucan x 20d), o/w normal.   HAMMER TOE SURGERY Left 07/08/2021   Procedure: Second metatarsal head excision; revision Second hammertoe correction; 2-5 percutaneus flexor tenotomies;  Surgeon: HWylene Simmer MD;  [place]  Service: Orthopedics;  Laterality: Left;   HARDWARE REMOVAL Left 07/08/2021   Procedure: Removal of deep implants from hallux proximal phalanx and First and Second metatarsals;  Surgeon: HWylene Simmer MD;  Location: [place];  Service: Orthopedics;  Laterality: Left;   HOLMIUM LASER APPLICATION Left 99/37/1696   Procedure: HOLMIUM LASER APPLICATION;  Surgeon: [name], MD;  Location: [facility];  Service: Urology;  Laterality: Left;   JOINT REPLACEMENT     LEFT HEART CATH AND CORONARY ANGIOGRAPHY N/A 05/15/2018   Evidence for spasm in distal LAD.  Mod RCA atherosclerosis, o/w no CAD.  Procedure: LEFT HEART CATH AND CORONARY ANGIOGRAPHY;  Surgeon: Leonie Man, MD;  Location: [facility];  Service: Cardiovascular;  Laterality: N/A;   NEGATIVE SLEEP STUDY  04/01/2007   NEPHROLITHOTOMY Left 08/05/2013   Procedure: NEPHROLITHOTOMY PERCUTANEOUS;  Surgeon: [name], MD;  Location: [facility];  Service: Urology;  Laterality: Left;   POLYSOMNOGRAM  01/2019   NORMAL   POLYSOMNOGRAPHY  10/25/2018   Dr. Dion Saucier due to pt inadequat sleep time (83 min).   TONSILLECTOMY AND ADENOIDECTOMY  1975   TOTAL KNEE ARTHROPLASTY Right 2003   TRANSTHORACIC ECHOCARDIOGRAM  05/2018; 12/03/2019   (after MI secondary to arterial spasm): EF 45-50%; severe hypokinesis of apical anterolateral, inferolateral , anterior, and inferior LV myocardium. 11/2019 EF 55-60%, valves fine    ULTRASOUND EXAM,PELVIC COMPLETE ([place] [facility])  06/25/2015   NORMAL (done by GYN)   Patient Active Problem List   Diagnosis Date Noted   Shortness of breath 04/19/2019   Healthcare maintenance 04/19/2019   Overweight (BMI 25.0-29.9) 02/19/2019   Ischemic cardiomyopathy 09/03/2018   Chest pain 07/11/2018   Coronary vasospasm ([place]) 07/11/2018   NSTEMI (non-ST elevated myocardial infarction) ([place]) 05/14/2018   Acute encephalopathy 11/19/2017   Hyponatremia 11/16/2017   Bipolar 1 disorder ([place]) 12/29/2016   Hypertension 12/29/2016   TIA (transient ischemic attack) 12/29/2016   Chronic pelvic pain in female 08/22/2016   Stage 2 chronic kidney disease 08/22/2016   Osteoporosis 09/30/2015   Health maintenance examination 06/11/2015   Suprapubic discomfort 06/05/2015   Urethral caruncle 06/05/2015   Infection of urinary tract 06/05/2015   TMJ (temporomandibular joint disorder) 02/25/2015   Right ankle injury 11/05/2014   Cervical spondylosis without myelopathy 12/19/2013   Sprain of neck 12/19/2013   Memory difficulties 12/19/2013   Postnasal drip 10/28/2013   Renal calculus 08/05/2013   Urinary frequency 08/01/2013   Constipation, chronic 07/29/2013   COPD (chronic obstructive pulmonary disease) ([place]) 06/30/2013   Abnormal chest x-ray 06/30/2013   Benign essential HTN 05/29/2013   Kidney stone on left side 05/29/2013   Hyperlipidemia 05/29/2013    PCP: Tammi Sou, MD  REFERRING PROVIDER:  foot: Corky Sing, PA-C     Neck: Phlip McGowen  REFERRING DIAG: M20.22 (ICD-10-CM) - Hallux rigidus, left foot  Procedure(s): 1.  Left first metatarsal removal of deep implant 2.  Left hallux proximal phalanx removal of deep implant 3.  Left hallux metatarsophalangeal joint arthrodesis 4.  Removal of deep implant from the left second metatarsal 5.  Left second metatarsal head excision 6.  Left second hammertoe correction 7.  Left third, fourth and fifth toe flexor digitorum  longus tenotomies 8.  AP, lateral and oblique radiographs of the left foot  THERAPY DIAG:  Stiffness of left foot, not elsewhere classified  Pain in left toe(s)  Difficulty walking  Cervicalgia  Rationale for Evaluation and Treatment Rehabilitation  ONSET DATE: 07/08/2021 Surgery  SUBJECTIVE:   SUBJECTIVE STATEMENT: 12/17/2021  No new complaints.   Eval: Pt states that she has L foot pain and swelling since having surgery. Pain  is the top of the foot and lateral. Pt has pain all the time walking, big toe mostly with walking. Putting her shoe on it hurts. She states her current shoes are too small and old. Pt has pain with any walking. Pushing off on the foot hurts. Pt denies red flag symptoms. Pt states she mainly sedentary due to the foot pain.   PERTINENT HISTORY: C/S surgery, Stroke, TIA, Hallux deformity+adhesive capsulitis+ hammertoe:  Severe structural bunion deformity with hallux interphalangeus and severe arthritis of the second MPJ left foot--surgical repair/osteotomies by [name] 04/2017.  PAIN:  Are you having pain? Yes: NPRS scale: 3/10 Pain location: L big toe Pain description: throbbing, sharp Aggravating factors: any movement Relieving factors: resting   Are you having pain? Yes: NPRS scale: 3/10 Pain location: L  neck, Radiating into L side of head Pain description: numb, painful Aggravating factors:  Relieving factors:   PRECAUTIONS: None  WEIGHT BEARING RESTRICTIONS No  FALLS:  Has patient fallen in last 6 months? No  LIVING ENVIRONMENT: Lives with: lives alone Lives in: House/apartment Stairs: 1st level Has following equipment at home: None  OCCUPATION: N/A  PLOF: Independent  PATIENT GOALS : Pt would like to get more mobile and improve walking.    OBJECTIVE:   DIAGNOSTIC FINDINGS: N/A   COGNITION:  Overall cognitive status: Within functional limits for tasks assessed     SENSATION: Light touch: Impaired   EDEMA:  Moderate edema  between 2nd and 3rd digit, non pitting   POSTURE: increased thoracic kyphosis, flexed trunk , and weight shift right, pes planus  PALPATION:    LOWER EXTREMITY ROM:  MMT Right eval Left eval  Ankle dorsiflexion 10 3  Ankle plantarflexion [facility]  Ankle inversion 25 20  Ankle eversion 20 10   (Blank rows = not tested)  Great toe extension on L: 5 deg  10/26/21:   Cervical ROM:  L rot: 35,  R rot 60, Flex: mod limitation, Ext: mod/significant limitaiton.   12/02/21: Cervical ROM:  L rot: 52,  R rot 60, Flex: min limitation, Ext: mod limitation.    LOWER EXTREMITY MMT:  Passive ROM Right eval Left eval L 12/02/21    Ankle dorsiflexion 4+/5 4/5 p! 4+    Ankle plantarflexion 4+/5 4/5 p! 4+    Ankle inversion 4+/5 4-/5 p! 4    Ankle eversion 4+/5 4-/5 p! 4     (Blank rows = not tested)   FUNCTIONAL TESTS:   GAIT: Distance walked: 133f Assistive device utilized: None Level of assistance: Complete Independence Comments: mild instability, decreased TKE in R knee.   TODAY'S TREATMENT:  12/17/2021 Therapeutic Exercise: Aerobic: Supine:  S/L:   Seated:   sit to stand x10;  Standing:    Stairs, 6 in no UE support x 6 (cuing for foot clearance); Slow march x 20;  Stretches:  Neuromuscular Re-education:Stride stance weight shifts x 20 bil;  Tandem stance 30 sec x 2 bil; Toe taps 6 in step x 20, no UE support;  March/walk 25 ft x 4; bwd walking 25 ft x 4; side stepping 25 ft x 4;  Manual Therapy:    Previous: Hip abd x10 on R,  clams x 10 on R;   PATIENT EDUCATION:  Education details: updated and reviewed HEP, POC.  Person educated: Patient Education method: Explanation, Demonstration, Tactile cues, Verbal cues, and Handouts Education comprehension: verbalized understanding, returned demonstration, verbal cues required, and tactile cues required   HOME EXERCISE PROGRAM:  Access  Code: [email or web address]   ASSESSMENT:  CLINICAL IMPRESSION: 12/17/2021 Pt did very well  with dynamic balance today, improving stability with standing activities. Likey d/c next visit if pt still doing well.      OBJECTIVE IMPAIRMENTS Abnormal gait, decreased activity tolerance, decreased balance, decreased coordination, decreased endurance, decreased knowledge of condition, decreased mobility, difficulty walking, decreased ROM, decreased strength, hypomobility, impaired flexibility, impaired sensation, improper body mechanics, postural dysfunction, and pain.   ACTIVITY LIMITATIONS carrying, lifting, standing, squatting, stairs, transfers, and locomotion level  PARTICIPATION LIMITATIONS: meal prep, cleaning, laundry, interpersonal relationship, driving, shopping, community activity, yard work, and exercise  PERSONAL FACTORS Age, Behavior pattern, Fitness, Past/current experiences, Time since onset of injury/illness/exacerbation, and 3+ comorbidities:    are also affecting patient's functional outcome.   REHAB POTENTIAL: Fair    CLINICAL DECISION MAKING: Evolving/moderate complexity  EVALUATION COMPLEXITY: Moderate   GOALS:   SHORT TERM GOALS: Target date: 11/04/2021   Pt will become independent with HEP in order to demonstrate synthesis of PT education.   Goal status: INITIAL  2.  Pt will report at least 2 pt reduction on NPRS scale for pain in order to demonstrate functional improvement with household activity, self care, and ADL.   Goal status: INITIAL  3.  Pt will score at least 19 pt increase on FOTO to demonstrate functional improvement in MCII and pt perceived function.    Goal status: INITIAL    LONG TERM GOALS: Target date: 12/16/2021   Pt  will become independent with final HEP in order to demonstrate synthesis of PT education.   Goal status: INITIAL  2.  Pt will score >/= 41 on FOTO to demonstrate improvement in perceived L foot function.   Goal status: INITIAL  3.  Pt will be able to demonstrate/report ability to walk >15 mins without pain in order  to demonstrate functional improvement and tolerance to exercise and community mobility.   Goal status: INITIAL  4.  Pt will be able to perform 5XSTS in under 12s  in order to demonstrate functional improvement above the cut off score for adults.   Goal status: INITIAL    5.  Pt to report decreased pain in neck, to 0-2/10 with activity, sleeping, and neck ROM.     Goal status: INITIAL/added 10/26/21       PLAN: PT FREQUENCY: 1-2x/week  PT DURATION: 12 weeks  PLANNED INTERVENTIONS: Therapeutic exercises, Therapeutic activity, Neuromuscular re-education, Balance training, Gait training, Patient/Family education, Joint manipulation, Joint mobilization, Stair training, Orthotic/Fit training, DME instructions, Aquatic Therapy, Dry Needling, Electrical stimulation, Spinal manipulation, Spinal mobilization, Moist heat, scar mobilization, Splintting, Taping, Vasopneumatic device, Traction, Ultrasound, Ionotophoresis '4mg'$ /ml Dexamethasone, Manual therapy, and Re-evaluation  PLAN FOR NEXT SESSION: standing balance, gait, LE strength, manual for neck pain, postural strength    Lyndee Hensen, PT, DPT 12:41 PM  12/17/21

## 2021-12-17 NOTE — Telephone Encounter (Signed)
Patient has lost her medication - She says she has looked everywhere. buPROPion (WELLBUTRIN XL) 150 MG 24 hr tablet [[id number]]   Please advise.  [phone]

## 2021-12-17 NOTE — Telephone Encounter (Signed)
Spoke with pharmacy and pt to notify that Rx will not be covered by insurance and she will have to pay 31.66 (per pharmacy) if filled early. Advised pt that she could call the pharmacy to see if she could have just enough to make it to her next scheduled fill and the cost.

## 2021-12-21 ENCOUNTER — Encounter: Payer: Self-pay | Admitting: Obstetrics & Gynecology

## 2021-12-23 ENCOUNTER — Encounter: Payer: Self-pay | Admitting: Physical Therapy

## 2021-12-23 ENCOUNTER — Ambulatory Visit (INDEPENDENT_AMBULATORY_CARE_PROVIDER_SITE_OTHER): Payer: Medicare Other | Admitting: Physical Therapy

## 2021-12-23 DIAGNOSIS — M25675 Stiffness of left foot, not elsewhere classified: Secondary | ICD-10-CM | POA: Diagnosis not present

## 2021-12-23 DIAGNOSIS — R262 Difficulty in walking, not elsewhere classified: Secondary | ICD-10-CM | POA: Diagnosis not present

## 2021-12-23 DIAGNOSIS — M542 Cervicalgia: Secondary | ICD-10-CM | POA: Diagnosis not present

## 2021-12-23 DIAGNOSIS — M79675 Pain in left toe(s): Secondary | ICD-10-CM

## 2021-12-23 NOTE — Therapy (Signed)
OUTPATIENT PHYSICAL THERAPY LOWER EXTREMITY TREATMENT   Patient Name: Erin Lopez MRN: [id number] DOB:[birth year], 71 y.o., female Today's Date: 12/23/2021       PT End of Session - 12/23/21 1444     Visit Number 9    Number of Visits 13    Date for PT Re-Evaluation 12/22/21    Authorization Type [employer] MCR,   PN done at visit 7 .    PT Start Time 1435    PT Stop Time 1513    PT Time Calculation (min) 38 min    Equipment Utilized During Treatment Gait belt    Activity Tolerance Patient tolerated treatment well    Behavior During Therapy WFL for tasks assessed/performed                  Past Medical History:  Diagnosis Date   Alcoholism in remission [facility])    states last alcohol 11 yrs ago   Anemia    Anxiety    Bipolar 1 disorder ([place])    Admission for mania x 2 (most recent 11/2017)   Cervical spondylosis without myelopathy 12/19/2013   MRI 02/2014: multilevel DDD/spondylosis, not much change compared to prior MRI.  Pt not in favor of invasive therapy for her neck as of 04/2014.   Cholelithiasis    COPD (chronic obstructive pulmonary disease) (HCC)    Moderate: anoro started 04/2017 by pulm, pt symptomatically improved and PFTs stable at f/u 06/2017.   Coronary artery spasm ([place]) 07/11/2018   nitrates=HA. Amlodipine=intol swelling.  Changed to coreg 09/03/18 by cardiologist.   Depression    Dilutional hyponatremia    Endometritis 05/2017   possible; empiric tx with Flagyl by GYN.   Environmental allergies    Fibromyalgia    GERD (gastroesophageal reflux disease)    Hepatic steatosis 03/2019   u/s abd   Herpes zoster 08/07/2017   L scapular region   Hiatal hernia    History of adenomatous polyp of colon 04/2008; 08/2014   No high grade dysplasia: recall 5 yrs ([name], [name])   History of basal cell carcinoma excision    NOSE   History of Helicobacter pylori infection 04/2008   +gastric biopsy (gastritis but no metaplasia, dysplasia, or malignancy  identified)   History of kidney stones    History of TIA (transient ischemic attack)    secondary to HELLP syndrome 1994 post c/s--  residual memory loss   Hyperlipidemia    statin started after her NSTEMI: goal  LDL <70.   Hypertension    Internal hemorrhoids    Ischemic cardiomyopathy 05/2018   Echo EF 45-50% in context of NSTEMI from CA spasm. 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 04/2017.   Leg pain    ABIs 11/2019: normal    Memory loss    Abnl MRI brain and CT brain c/w chronic microvascular ischemia.  Repeat MRI brain 02/2014 stable (Dr. Mayra Reel with no plan to f/u with neuro as of 04/2014)   NSTEMI (non-ST elevated myocardial infarction) ([place]) 05/2018   Echo EF 45-50% in context of NSTEMI from CA spasm.// Myoview 8/21: EF 66, no ischemia, low risk   Osteoarthritis of left wrist    STT and 1st [facility] jt   Osteoporosis 05/2010   2012 'penia;  06/2015 'porosis:  prolia.  DEXA 09/2017 T-score -2.2 femur neck. 11/2021 T score -2.2. Rpt 2 yrs   Pelvic floor dysfunction    [facility] ([name])   Pneumonia    Postmenopausal vaginal bleeding 05/2017   x 1 small episode; GYN attempted endo bx but unable to penetrate cervix due to severe cerv stenosis (due to postmenopausal state + hx of LEEP).  Endo u/s showed thin endo lining.  Per GYN---no evidence of endomet pathology on w/u---obs as of 07/2017.   Prediabetes 06/2016   Fasting gluc 105; HbA1c at that time was 6.0%.  A1c 6.2% 12/2017.  A1c 5.7% Feb 2023.   Recurrent kidney stones 08/2013   Left ureteral calculus: Perc nephr--cystoscopy w/ureteroscopy + laser for stone removal.  Residual asymptomatic left renal nephrolithiasis <53mm present post-procedure.  Right sided  hydronephrosis-persistent (on u/s)--urol ordered CT to further eval 08/2016: 1.6 cm nonobstructing stone lower pole left kidney, no hydro, plan for PCN extraction ([facility])   Shortness of breath    Sprain of neck 12/19/2013   Stroke (HCC)    TIA   TMJ (temporomandibular joint disorder)    USES  MOUTH GUARD   Toe fracture, left 12/2017   2nd toe prox phalanx (immobilization, [name])   Tubular adenoma of colon    Vitamin D deficiency 05/2011   Weak urinary stream 07/2016   with elevated PVR and mild left hydronephrosis secondary to this ([facility] started flomax 0.$RemoveBeforeDE'4mg'QPsAtHlwOhUiopG$  qd for this).   Past Surgical History:  Procedure Laterality Date   ABIs  11/29/2019   NORMAL   ANTERIOR CERVICAL DECOMP/DISCECTOMY FUSION  06/30/2009   C3 -- C5  AND EXPLORATION OF FUSION C5-7 W/  PLATE REMOVAL   ARTHRODESIS METATARSALPHALANGEAL JOINT (MTPJ) Left 07/08/2021   Procedure: Left hallux metatarsophalangeal joint arthrodesis;  Surgeon: Wylene Simmer, MD;  Location: [place];  Service: Orthopedics;  Laterality: Left;   BACK SURGERY     BUNIONECTOMY Left    CARDIOVASCULAR STRESS TEST  12/03/2019   myoc perf im: NORMAL   CERVICAL FUSION  2002   anterior C5 -- C7   CERVICAL SPINE SURGERY  08/27/1999     C6 -- T1  LAMINECTOMY/  DISKECTOMY   CESAREAN SECTION  1994   CHOLECYSTECTOMY N/A 03/06/2020   Procedure: LAPAROSCOPIC CHOLECYSTECTOMY;  Surgeon: Felicie Morn, MD;  Location: [place];  Service: General;  Laterality: N/A;   COLONOSCOPY W/ POLYPECTOMY  04/2008;08/2014   2016 tubular adenoma x 1; +diverticulosis and int/ext hemorrhoids.  06/2020 adenoma, recall 5 yrs.   CYSTOSCOPY WITH URETEROSCOPY AND STENT PLACEMENT Left 08/28/2013   Procedure: CYSTOSCOPY, RGP,  WITH URETEROSCOPY AND STENT REMOVAL;  Surgeon: [name], MD;  Location: [facility];  Service: Urology;  Laterality: Left;   DEXA  09/2017   T-score -2.2 femur neck: improved compared to 06/2015.  2021 DEXA  T score -2.3.  11/2021 DEXA T score -2.2.   ESOPHAGOGASTRODUODENOSCOPY  2010; 08/2014   2016 +Candidal esophagitis; Mild chronic gastritis w/intestinal metaplasia--NEG H pylori, neg for eosinophilic esoph. 0/9323 esoph candidiasis (diflucan x 20d), o/w normal.   HAMMER TOE SURGERY Left 07/08/2021   Procedure: Second metatarsal head excision; revision Second hammertoe correction; 2-5 percutaneus flexor tenotomies;  Surgeon: Wylene Simmer, MD;  Location: [place];  Service: Orthopedics;  Laterality: Left;   HARDWARE REMOVAL Left 07/08/2021   Procedure: Removal of deep implants from hallux proximal phalanx and First and Second metatarsals;  Surgeon: Wylene Simmer, MD;  Location: [place];  Service: Orthopedics;  Laterality: Left;   HOLMIUM LASER APPLICATION Left 92/42/6834   Procedure: HOLMIUM LASER APPLICATION;  Surgeon: [name], MD;  Location: [facility];  Service: Urology;  Laterality: Left;   JOINT REPLACEMENT     LEFT HEART CATH AND CORONARY ANGIOGRAPHY N/A 05/15/2018   Evidence for spasm in distal LAD.  Mod RCA atherosclerosis, o/w no CAD.  Procedure: LEFT HEART CATH AND CORONARY ANGIOGRAPHY;  Surgeon: Leonie Man, MD;  Location: [place] [facility];  Service: Cardiovascular;  Laterality: N/A;   NEGATIVE SLEEP STUDY  04/01/2007   NEPHROLITHOTOMY Left 08/05/2013   Procedure: NEPHROLITHOTOMY PERCUTANEOUS;  Surgeon: [name], MD;  Location: [facility];  Service: Urology;  Laterality: Left;   POLYSOMNOGRAM  01/2019   NORMAL   POLYSOMNOGRAPHY  10/25/2018   Dr. Dion Saucier due to pt inadequat sleep time (83 min).   TONSILLECTOMY AND ADENOIDECTOMY  1975   TOTAL KNEE ARTHROPLASTY Right 2003   TRANSTHORACIC ECHOCARDIOGRAM  05/2018; 12/03/2019   (after MI secondary to arterial spasm): EF 45-50%; severe hypokinesis of apical anterolateral, inferolateral , anterior, and inferior LV myocardium. 11/2019 EF 55-60%, valves fine    ULTRASOUND EXAM,PELVIC COMPLETE ([place] [facility])  06/25/2015   NORMAL (done by GYN)   Patient Active Problem List   Diagnosis Date Noted   Shortness of breath 04/19/2019   Healthcare maintenance 04/19/2019   Overweight (BMI 25.0-29.9) 02/19/2019   Ischemic cardiomyopathy 09/03/2018   Chest pain 07/11/2018   Coronary vasospasm ([place]) 07/11/2018   NSTEMI (non-ST elevated myocardial infarction) ([place]) 05/14/2018   Acute encephalopathy 11/19/2017   Hyponatremia 11/16/2017   Bipolar 1 disorder ([place]) 12/29/2016   Hypertension 12/29/2016   TIA (transient ischemic attack) 12/29/2016   Chronic pelvic pain in female 08/22/2016   Stage 2 chronic kidney disease 08/22/2016   Osteoporosis 09/30/2015   Health maintenance examination 06/11/2015   Suprapubic discomfort 06/05/2015   Urethral caruncle 06/05/2015   Infection of urinary tract 06/05/2015   TMJ (temporomandibular joint disorder) 02/25/2015   Right ankle injury 11/05/2014   Cervical spondylosis without myelopathy 12/19/2013   Sprain of neck 12/19/2013   Memory difficulties 12/19/2013   Postnasal drip 10/28/2013   Renal calculus 08/05/2013   Urinary frequency 08/01/2013   Constipation, chronic 07/29/2013   COPD (chronic obstructive pulmonary disease) ([place]) 06/30/2013   Abnormal chest x-ray 06/30/2013   Benign essential HTN 05/29/2013   Kidney stone on left side 05/29/2013   Hyperlipidemia 05/29/2013    PCP: Tammi Sou, MD  REFERRING PROVIDER:  foot: Corky Sing, PA-C     Neck: Phlip McGowen  REFERRING DIAG: M20.22 (ICD-10-CM) - Hallux rigidus, left foot  Procedure(s): 1.  Left first metatarsal removal of deep implant 2.  Left hallux proximal phalanx removal of deep implant 3.  Left hallux metatarsophalangeal joint arthrodesis 4.  Removal of deep implant from the left second metatarsal 5.  Left second metatarsal head excision 6.  Left second hammertoe correction 7.  Left third, fourth and fifth toe flexor digitorum  longus tenotomies 8.  AP, lateral and oblique radiographs of the left foot  THERAPY DIAG:  Stiffness of left foot, not elsewhere classified  Pain in left toe(s)  Difficulty walking  Cervicalgia  Rationale for Evaluation and Treatment Rehabilitation  ONSET DATE: 07/08/2021 Surgery  SUBJECTIVE:   SUBJECTIVE STATEMENT: 12/23/2021  No new complaints. Having knee surgery at beginning of Oct. Feels she is doing very well.   Eval: Pt  states that she has L foot pain and swelling since having surgery. Pain is the top of the foot and lateral. Pt has pain all the time walking, big toe mostly with walking. Putting her shoe on it hurts. She states her current shoes are too small and old. Pt has pain with any walking. Pushing off on the foot hurts. Pt denies red flag symptoms. Pt states she mainly sedentary due to the foot pain.   PERTINENT HISTORY: C/S surgery, Stroke, TIA, Hallux deformity+adhesive capsulitis+ hammertoe:  Severe structural bunion deformity with hallux interphalangeus and severe arthritis of the second MPJ left foot--surgical repair/osteotomies by [name] 04/2017.  PAIN:  Are you having pain? Yes: NPRS scale: 3/10 Pain location: L big toe Pain description: throbbing, sharp Aggravating factors: any movement Relieving factors: resting   Are you having pain? Yes: NPRS scale: 3/10 Pain location: L  neck, Radiating into L side of head Pain description: numb, painful Aggravating factors:  Relieving factors:   PRECAUTIONS: None  WEIGHT BEARING RESTRICTIONS No  FALLS:  Has patient fallen in last 6 months? No  LIVING ENVIRONMENT: Lives with: lives alone Lives in: House/apartment Stairs: 1st level Has following equipment at home: None  OCCUPATION: N/A  PLOF: Independent  PATIENT GOALS : Pt would like to get more mobile and improve walking.    OBJECTIVE:   DIAGNOSTIC FINDINGS: N/A   COGNITION:  Overall cognitive status: Within functional limits for tasks  assessed     SENSATION: Light touch: Impaired   EDEMA:  Moderate edema between 2nd and 3rd digit, non pitting   POSTURE: increased thoracic kyphosis, flexed trunk , and weight shift right, pes planus  PALPATION:    LOWER EXTREMITY ROM:  MMT Right eval Left eval  Ankle dorsiflexion 10 3  Ankle plantarflexion [facility]  Ankle inversion 25 20  Ankle eversion 20 10   (Blank rows = not tested)  Great toe extension on L: 5 deg  10/26/21:   Cervical ROM:  L rot: 35,  R rot 60, Flex: mod limitation, Ext: mod/significant limitaiton.   12/02/21: Cervical ROM:  L rot: 52,  R rot 60, Flex: min limitation, Ext: mod limitation.   9/21:  L rotation : 55   LOWER EXTREMITY MMT:  Passive ROM Right eval Left eval L 12/02/21    Ankle dorsiflexion 4+/5 4/5 p! 4+    Ankle plantarflexion 4+/5 4/5 p! 4+    Ankle inversion 4+/5 4-/5 p! 4    Ankle eversion 4+/5 4-/5 p! 4     (Blank rows = not tested)   FUNCTIONAL TESTS:      TODAY'S TREATMENT:  12/23/2021 Therapeutic Exercise: Aerobic: Supine:  S/L:   Seated:   sit to stand x10 regular chair height;  LAQ 4lb x 20 bil; Cervical rotation x 10;  Standing:    Stairs, 6 in no UE support x 10 bil; Slow march x 20; Bwd walking 15 ft x 6; Gait: 3ft x 6;  Stretches:  Neuromuscular Re-education: Tandem stance 30 sec x 2 bil;  March/walk 25 ft x 4; bwd walking 25 ft x 4;  Manual Therapy:      PATIENT EDUCATION:  Education details: updated and reviewed HEP  Person educated: Patient Education method: Explanation, Demonstration, Tactile cues, Verbal cues, and Handouts Education comprehension: verbalized understanding, returned demonstration, verbal cues required, and tactile cues required   HOME EXERCISE PROGRAM:  Access Code: [email or web address]   ASSESSMENT:  CLINICAL IMPRESSION: 12/23/2021 Pt has made good progress. Doing well  with HEP. She has met goals at this time, is ready for d/c to HEP. Final HEP reviewed today. Pt having knee  surgery in next few week.      OBJECTIVE IMPAIRMENTS Abnormal gait, decreased activity tolerance, decreased balance, decreased coordination, decreased endurance, decreased knowledge of condition, decreased mobility, difficulty walking, decreased ROM, decreased strength, hypomobility, impaired flexibility, impaired sensation, improper body mechanics, postural dysfunction, and pain.   ACTIVITY LIMITATIONS carrying, lifting, standing, squatting, stairs, transfers, and locomotion level  PARTICIPATION LIMITATIONS: meal prep, cleaning, laundry, interpersonal relationship, driving, shopping, community activity, yard work, and exercise  PERSONAL FACTORS Age, Behavior pattern, Fitness, Past/current experiences, Time since onset of injury/illness/exacerbation, and 3+ comorbidities:    are also affecting patient's functional outcome.   REHAB POTENTIAL: Fair    CLINICAL DECISION MAKING: Evolving/moderate complexity  EVALUATION COMPLEXITY: Moderate   GOALS:   SHORT TERM GOALS: Target date: 11/04/2021   Pt will become independent with HEP in order to demonstrate synthesis of PT education.   Goal status: MET  2.  Pt will report at least 2 pt reduction on NPRS scale for pain in order to demonstrate functional improvement with household activity, self care, and ADL.   Goal status: MET  3.  Pt will score at least 19 pt increase on FOTO to demonstrate functional improvement in MCII and pt perceived function.    Goal status: INITIAL/ N/A    LONG TERM GOALS: Target date: 12/16/2021   Pt  will become independent with final HEP in order to demonstrate synthesis of PT education.   Goal status: MET  2.  Pt will score >/= 41 on FOTO to demonstrate improvement in perceived L foot function.   Goal status: INITIAL/N/A  3.  Pt will be able to demonstrate/report ability to walk >15 mins without pain in order to demonstrate functional improvement and tolerance to exercise and community mobility.    Goal status: MET  4.  Pt will be able to perform 5XSTS in under 12s  in order to demonstrate functional improvement above the cut off score for adults.   Goal status: MET    5.  Pt to report decreased pain in neck, to 0-2/10 with activity, sleeping, and neck ROM.     Goal status: MET        PLAN: PT FREQUENCY: 1-2x/week  PT DURATION: 12 weeks  PLANNED INTERVENTIONS: Therapeutic exercises, Therapeutic activity, Neuromuscular re-education, Balance training, Gait training, Patient/Family education, Joint manipulation, Joint mobilization, Stair training, Orthotic/Fit training, DME instructions, Aquatic Therapy, Dry Needling, Electrical stimulation, Spinal manipulation, Spinal mobilization, Moist heat, scar mobilization, Splintting, Taping, Vasopneumatic device, Traction, Ultrasound, Ionotophoresis 4mg /ml Dexamethasone, Manual therapy, and Re-evaluation  PLAN FOR NEXT SESSION:    Lyndee Hensen, PT, DPT 3:15 PM  12/23/21  PHYSICAL THERAPY DISCHARGE SUMMARY  Visits from Start of Care: 9  Plan: Patient agrees to discharge.  Patient goals were  met. Patient is being discharged due to meeting the stated rehab goals.     Lyndee Hensen, PT, DPT 3:15 PM  12/23/21

## 2021-12-25 ENCOUNTER — Other Ambulatory Visit: Payer: Self-pay | Admitting: Family Medicine

## 2021-12-27 ENCOUNTER — Other Ambulatory Visit: Payer: Self-pay | Admitting: Family Medicine

## 2021-12-28 ENCOUNTER — Ambulatory Visit (INDEPENDENT_AMBULATORY_CARE_PROVIDER_SITE_OTHER): Payer: Medicare Other | Admitting: Family Medicine

## 2021-12-28 ENCOUNTER — Other Ambulatory Visit: Payer: Self-pay | Admitting: Family Medicine

## 2021-12-28 ENCOUNTER — Encounter: Payer: Self-pay | Admitting: Family Medicine

## 2021-12-28 VITALS — BP 132/76 | HR 62 | Temp 97.7°F | Ht 66.0 in | Wt 181.2 lb

## 2021-12-28 DIAGNOSIS — Z0000 Encounter for general adult medical examination without abnormal findings: Secondary | ICD-10-CM

## 2021-12-28 DIAGNOSIS — F3177 Bipolar disorder, in partial remission, most recent episode mixed: Secondary | ICD-10-CM

## 2021-12-28 DIAGNOSIS — Z01818 Encounter for other preprocedural examination: Secondary | ICD-10-CM

## 2021-12-28 NOTE — Patient Instructions (Signed)

## 2021-12-28 NOTE — Telephone Encounter (Signed)
Requesting: Zolpidem Contract: n/a for this med UDS: 10/29/20 Last Visit: 12/28/21 Next Visit:  03/30/22 Last Refill: 05/06/21 (90,1)  Please Advise. Med pending
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
## 2021-12-31 ENCOUNTER — Encounter: Payer: Medicare Other | Admitting: Physical Therapy

## 2022-01-02 ENCOUNTER — Other Ambulatory Visit: Payer: Self-pay | Admitting: Family Medicine

## 2022-01-03 ENCOUNTER — Encounter: Payer: Medicare Other | Admitting: Physical Therapy

## 2022-01-03 ENCOUNTER — Ambulatory Visit: Payer: Medicare Other | Admitting: Family Medicine

## 2022-01-04 ENCOUNTER — Other Ambulatory Visit: Payer: Self-pay | Admitting: Physician Assistant

## 2022-01-04 DIAGNOSIS — Z96651 Presence of right artificial knee joint: Secondary | ICD-10-CM | POA: Diagnosis not present
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
## 2022-01-05 ENCOUNTER — Encounter: Payer: Self-pay | Admitting: Physical Therapy

## 2022-01-05 ENCOUNTER — Other Ambulatory Visit: Payer: Self-pay | Admitting: Family Medicine

## 2022-01-05 MED ORDER — ZOLPIDEM TARTRATE 10 MG PO TABS: ORAL_TABLET | ORAL | 1 refills | Status: DC

## 2022-01-05 NOTE — Patient Instructions (Addendum)
DUE TO COVID-19 ONLY TWO VISITORS  (aged 69 and older)  ARE ALLOWED TO COME WITH YOU AND STAY IN THE WAITING ROOM ONLY DURING PRE OP AND PROCEDURE.   **NO VISITORS ARE ALLOWED IN THE SHORT STAY AREA OR RECOVERY ROOM!!**  IF YOU WILL BE ADMITTED INTO THE HOSPITAL YOU ARE ALLOWED ONLY FOUR SUPPORT PEOPLE DURING VISITATION HOURS ONLY (7 AM -8PM)   The support person(s) must pass our screening, gel in and out, and wear a mask at all times, including in the patient's room. Patients must also wear a mask when staff or their support person are in the room. Visitors GUEST BADGE MUST BE WORN VISIBLY  One adult visitor may remain with you overnight and MUST be in the room by 8 P.M.     Your procedure is scheduled on: 01/19/22   Report to [facility] Main Entrance    Report to admitting at : 10:50 AM   Call this number if you have problems the morning of surgery [phone]   Do not eat food :After Midnight.   After Midnight you may have the following liquids until : 10:00 AM DAY OF SURGERY  Water Black Coffee (sugar ok, NO MILK/CREAM OR CREAMERS)  Tea (sugar ok, NO MILK/CREAM OR CREAMERS) regular and decaf                             Plain Jell-O (NO RED)                                           Fruit ices (not with fruit pulp, NO RED)                                     Popsicles (NO RED)                                                                  Juice: apple, WHITE grape, WHITE cranberry Sports drinks like Gatorade (NO RED)              Drink G2 drink AT:  10:00 AM the day of surgery.      The day of surgery:  Drink ONE (1) Pre-Surgery Clear Ensure or G2 at AM the morning of surgery. Drink in one sitting. Do not sip.  This drink was given to you during your hospital  pre-op appointment visit. Nothing else to drink after completing the  Pre-Surgery Clear Ensure or G2.          If you have questions, please contact your surgeon's office.  Oral Hygiene is also  important to reduce your risk of infection.                                    Remember - BRUSH YOUR TEETH THE MORNING OF SURGERY WITH YOUR REGULAR TOOTHPASTE   Do NOT smoke after Midnight   Take these medicines the morning of surgery with A SIP  OF WATER: bupropion,citalopram,metoprolol.Use inhalers and Flonase as usual.Alprazolam,famotidine as needed.  DO NOT TAKE ANY ORAL DIABETIC MEDICATIONS DAY OF YOUR SURGERY                              You may not have any metal on your body including hair pins, jewelry, and body piercing             Do not wear make-up, lotions, powders, perfumes/cologne, or deodorant  Do not wear nail polish including gel and S&S, artificial/acrylic nails, or any other type of covering on natural nails including finger and toenails. If you have artificial nails, gel coating, etc. that needs to be removed by a nail salon please have this removed prior to surgery or surgery may need to be canceled/ delayed if the surgeon/ anesthesia feels like they are unable to be safely monitored.   Do not shave  48 hours prior to surgery.    Do not bring valuables to the hospital. [place].   Contacts, dentures or bridgework may not be worn into surgery.   Bring small overnight bag day of surgery.   DO NOT Hydro. PHARMACY WILL DISPENSE MEDICATIONS LISTED ON YOUR MEDICATION LIST TO YOU DURING YOUR ADMISSION [place]!    Patients discharged on the day of surgery will not be allowed to drive home.  Someone NEEDS to stay with you for the first 24 hours after anesthesia.   Special Instructions: Bring a copy of your healthcare power of attorney and living will documents         the day of surgery if you haven't scanned them before.              Please read over the following fact sheets you were given: IF YOU HAVE QUESTIONS ABOUT YOUR PRE-OP INSTRUCTIONS PLEASE CALL [phone]      [facility] - Preparing for Surgery Before surgery, you can play an important role.  Because skin is not sterile, your skin needs to be as free of germs as possible.  You can reduce the number of germs on your skin by washing with CHG (chlorahexidine gluconate) soap before surgery.  CHG is an antiseptic cleaner which kills germs and bonds with the skin to continue killing germs even after washing. Please DO NOT use if you have an allergy to CHG or antibacterial soaps.  If your skin becomes reddened/irritated stop using the CHG and inform your nurse when you arrive at Short Stay. Do not shave (including legs and underarms) for at least 48 hours prior to the first CHG shower.  You may shave your face/neck. Please follow these instructions carefully:  1.  Shower with CHG Soap the night before surgery and the  morning of Surgery.  2.  If you choose to wash your hair, wash your hair first as usual with your  normal  shampoo.  3.  After you shampoo, rinse your hair and body thoroughly to remove the  shampoo.                           4.  Use CHG as you would any other liquid soap.  You can apply chg directly  to the skin and wash  Gently with a scrungie or clean washcloth.  5.  Apply the CHG Soap to your body ONLY FROM THE NECK DOWN.   Do not use on face/ open                           Wound or open sores. Avoid contact with eyes, ears mouth and genitals (private parts).                       Wash face,  Genitals (private parts) with your normal soap.             6.  Wash thoroughly, paying special attention to the area where your surgery  will be performed.  7.  Thoroughly rinse your body with warm water from the neck down.  8.  DO NOT shower/wash with your normal soap after using and rinsing off  the CHG Soap.                9.  Pat yourself dry with a clean towel.            10.  Wear clean pajamas.            11.  Place clean sheets on your bed the night of your first shower and  do not  sleep with pets. Day of Surgery : Do not apply any lotions/deodorants the morning of surgery.  Please wear clean clothes to the hospital/surgery center.  FAILURE TO FOLLOW THESE INSTRUCTIONS MAY RESULT IN THE CANCELLATION OF YOUR SURGERY PATIENT SIGNATURE_________________________________  NURSE SIGNATURE__________________________________  ________________________________________________________________________   Adam Phenix  An incentive spirometer is a tool that can help keep your lungs clear and active. This tool measures how well you are filling your lungs with each breath. Taking long deep breaths may help reverse or decrease the chance of developing breathing (pulmonary) problems (especially infection) following: A long period of time when you are unable to move or be active. BEFORE THE PROCEDURE  If the spirometer includes an indicator to show your best effort, your nurse or respiratory therapist will set it to a desired goal. If possible, sit up straight or lean slightly forward. Try not to slouch. Hold the incentive spirometer in an upright position. INSTRUCTIONS FOR USE  Sit on the edge of your bed if possible, or sit up as far as you can in bed or on a chair. Hold the incentive spirometer in an upright position. Breathe out normally. Place the mouthpiece in your mouth and seal your lips tightly around it. Breathe in slowly and as deeply as possible, raising the piston or the ball toward the top of the column. Hold your breath for 3-5 seconds or for as long as possible. Allow the piston or ball to fall to the bottom of the column. Remove the mouthpiece from your mouth and breathe out normally. Rest for a few seconds and repeat Steps 1 through 7 at least 10 times every 1-2 hours when you are awake. Take your time and take a few normal breaths between deep breaths. The spirometer may include an indicator to show your best effort. Use the indicator as a goal to work  toward during each repetition. After each set of 10 deep breaths, practice coughing to be sure your lungs are clear. If you have an incision (the cut made at the time of surgery), support your incision when coughing by placing a pillow or rolled up towels  firmly against it. Once you are able to get out of bed, walk around indoors and cough well. You may stop using the incentive spirometer when instructed by your caregiver.  RISKS AND COMPLICATIONS Take your time so you do not get dizzy or light-headed. If you are in pain, you may need to take or ask for pain medication before doing incentive spirometry. It is harder to take a deep breath if you are having pain. AFTER USE Rest and breathe slowly and easily. It can be helpful to keep track of a log of your progress. Your caregiver can provide you with a simple table to help with this. If you are using the spirometer at home, follow these instructions: [place] IF:  You are having difficultly using the spirometer. You have trouble using the spirometer as often as instructed. Your pain medication is not giving enough relief while using the spirometer. You develop fever of 100.5 F (38.1 C) or higher. SEEK IMMEDIATE MEDICAL CARE IF:  You cough up bloody sputum that had not been present before. You develop fever of 102 F (38.9 C) or greater. You develop worsening pain at or near the incision site. MAKE SURE YOU:  Understand these instructions. Will watch your condition. Will get help right away if you are not doing well or get worse. Document Released: 08/01/2006 Document Revised: 06/13/2011 Document Reviewed: 10/02/2006 [facility] Patient Information 2014 [place], [place].   ________________________________________________________________________

## 2022-01-05 NOTE — Addendum Note (Signed)
Addended by: Deveron Furlong D on: 01/05/2022 02:56 PM   Modules accepted: Orders

## 2022-01-05 NOTE — Telephone Encounter (Signed)
Pt reports that her  pharmacy sent over a request for Zolpidem(ambien) to be refilled a couple days ago. Please advise patient if its been sent. Thank you.

## 2022-01-05 NOTE — Telephone Encounter (Addendum)
Requesting: Zolpidem Contract: n/a UDS: 10/29/20 Last Visit: 12/28/21 Next Visit: 03/30/22 Last Refill: 12/28/21 (90,1)  Spoke with pt, will have to confirm with pharmacy recent refill received prior to resending anything. Confirmed no rx was received from 9/26. Please resend

## 2022-01-06 ENCOUNTER — Other Ambulatory Visit: Payer: Self-pay

## 2022-01-06 ENCOUNTER — Encounter (HOSPITAL_COMMUNITY): Payer: Self-pay

## 2022-01-06 ENCOUNTER — Encounter (HOSPITAL_COMMUNITY)
Admission: RE | Admit: 2022-01-06 | Discharge: 2022-01-06 | Disposition: A | Discharge status: Home / Self Care | Payer: Medicare Other | Source: Ambulatory Visit | Attending: Orthopedic Surgery | Admitting: Orthopedic Surgery

## 2022-01-06 VITALS — BP 149/73 | HR 63 | Temp 98.2°F | Ht 66.0 in | Wt 180.0 lb

## 2022-01-06 DIAGNOSIS — I255 Ischemic cardiomyopathy: Secondary | ICD-10-CM | POA: Insufficient documentation

## 2022-01-06 DIAGNOSIS — Z01812 Encounter for preprocedural laboratory examination: Secondary | ICD-10-CM | POA: Insufficient documentation

## 2022-01-06 DIAGNOSIS — J449 Chronic obstructive pulmonary disease, unspecified: Secondary | ICD-10-CM | POA: Insufficient documentation

## 2022-01-06 DIAGNOSIS — Z01818 Encounter for other preprocedural examination: Secondary | ICD-10-CM

## 2022-01-06 DIAGNOSIS — I2510 Atherosclerotic heart disease of native coronary artery without angina pectoris: Secondary | ICD-10-CM | POA: Diagnosis not present

## 2022-01-06 DIAGNOSIS — T84092A Other mechanical complication of internal right knee prosthesis, initial encounter: Secondary | ICD-10-CM | POA: Diagnosis not present

## 2022-01-06 DIAGNOSIS — X58XXXA Exposure to other specified factors, initial encounter: Secondary | ICD-10-CM | POA: Diagnosis not present

## 2022-01-06 DIAGNOSIS — R7303 Prediabetes: Secondary | ICD-10-CM | POA: Diagnosis not present

## 2022-01-06 DIAGNOSIS — I10 Essential (primary) hypertension: Secondary | ICD-10-CM | POA: Diagnosis not present

## 2022-01-06 HISTORY — DX: Prediabetes: R73.03

## 2022-01-06 HISTORY — DX: Malignant (primary) neoplasm, unspecified: C80.1

## 2022-01-06 LAB — CBC
HCT: 34 % — ABNORMAL LOW (ref 36.0–46.0)
Hemoglobin: 11.4 g/dL — ABNORMAL LOW (ref 12.0–15.0)
MCH: 31 pg (ref 26.0–34.0)
MCHC: 33.5 g/dL (ref 30.0–36.0)
MCV: 92.4 fL (ref 80.0–100.0)
Platelets: 239 10*3/uL (ref 150–400)
RBC: 3.68 MIL/uL — ABNORMAL LOW (ref 3.87–5.11)
RDW: 12.7 % (ref 11.5–15.5)
WBC: 7.5 10*3/uL (ref 4.0–10.5)
nRBC: 0 % (ref 0.0–0.2)

## 2022-01-06 LAB — BASIC METABOLIC PANEL
Anion gap: 7 (ref 5–15)
BUN: 17 mg/dL (ref 8–23)
CO2: 28 mmol/L (ref 22–32)
Calcium: 9.5 mg/dL (ref 8.9–10.3)
Chloride: 101 mmol/L (ref 98–111)
Creatinine, Ser: 0.96 mg/dL (ref 0.44–1.00)
GFR, Estimated: >60 mL/min (ref >60)
Glucose, Bld: 88 mg/dL (ref 70–99)
Potassium: 4.5 mmol/L (ref 3.5–5.1)
Sodium: 136 mmol/L (ref 135–145)

## 2022-01-06 LAB — HEMOGLOBIN A1C
Hgb A1c MFr Bld: 5.5 % (ref 4.8–5.6)
Mean Plasma Glucose: 111.15 mg/dL

## 2022-01-06 LAB — GLUCOSE, CAPILLARY: Glucose-Capillary: 87 mg/dL (ref 70–99)

## 2022-01-06 LAB — SURGICAL PCR SCREEN
MRSA, PCR: NEGATIVE
Staphylococcus aureus: NEGATIVE

## 2022-01-06 NOTE — Telephone Encounter (Signed)
Pt advised refill sent. °

## 2022-01-06 NOTE — Progress Notes (Signed)
For Short Stay: [place] appointment date: Date of COVID positive in last 41 days:  Bowel Prep reminder:   For Anesthesia: PCP - Dr. Shawnie Dapper Cardiologist - Dr. Fransico Him Clearance: Nicholes Rough: PA-P: 9/8/23EPIC?Chart Chest x-ray - 11/22/21 EKG - 11/24/21 Stress Test -  ECHO - 12/03/19 Cardiac Cath - 2020 Pacemaker/ICD device last checked: Pacemaker orders received: Device Rep notified:  Spinal Cord Stimulator:  Sleep Study -  CPAP -   Fasting Blood Sugar -  Checks Blood Sugar _____ times a day Date and result of last Hgb A1c-  Blood Thinner Instructions: Aspirin Instructions: Will be hold 7 days before surgery. Last Dose:  Activity level: Can go up a flight of stairs and activities of daily living without stopping and without chest pain and/or shortness of breath   Able to exercise without chest pain and/or shortness of breath   Unable to go up a flight of stairs without chest pain and/or shortness of breath     Anesthesia review: Hx: HTN,NSTEMI,Pre-DIA,COPD  Patient denies shortness of breath, fever, cough and chest pain at PAT appointment   Patient verbalized understanding of instructions that were given to them at the PAT appointment. Patient was also instructed that they will need to review over the PAT instructions again at home before surgery. For Short Stay: [place] appointment date: Date of COVID positive in last 52 days:  Bowel Prep reminder:   For Anesthesia: PCP -  Cardiologist -   Chest x-ray -  EKG -  Stress Test -  ECHO -  Cardiac Cath -  Pacemaker/ICD device last checked: Pacemaker orders received: Device Rep notified:  Spinal Cord Stimulator:  Sleep Study -  CPAP -   Fasting Blood Sugar -  Checks Blood Sugar _____ times a day Date and result of last Hgb A1c-  Blood Thinner Instructions: Aspirin Instructions: Last Dose:  Activity level: Can go up a flight of stairs and activities of daily living without stopping  and without chest pain and/or shortness of breath   Able to exercise without chest pain and/or shortness of breath   Unable to go up a flight of stairs without chest pain and/or shortness of breath     Anesthesia review:   Patient denies shortness of breath, fever, cough and chest pain at PAT appointment   Patient verbalized understanding of instructions that were given to them at the PAT appointment. Patient was also instructed that they will need to review over the PAT instructions again at home before surgery.

## 2022-01-07 NOTE — Progress Notes (Signed)
Anesthesia Chart Review   Case: [id number] Date/Time: 01/19/22 1305   Procedure: Right knee polyethylene vs total knee arthroplasty revision (Right: Knee)   Anesthesia type: Choice   Pre-op diagnosis: Failed right total knee arthroplasty   Location: [facility]OM 09 / [facility]   Surgeons: Gaynelle Arabian, MD       DISCUSSION:71 y.o. never smoker with h/o HTN, COPD, CAD, ischemic cardiomyopathy, failed right total knee arthroplasty scheduled for above procedure 01/19/2022 with Dr. Gaynelle Arabian.   Pt last seen by PCP 12/28/2021. Per OV note pt is low risk for planned procedure.   Pt last seen by cardiology 12/10/2021. Per OV note, "Ms. Gibeau's perioperative risk of a major cardiac event is 0.9% according to the Revised Cardiac Risk Index (RCRI).  Therefore, she is at low risk for perioperative complications.   Her functional capacity is good at 5.07 METs according to the Duke Activity Status Index (DASI). Recommendations: According to ACC/AHA guidelines, no further cardiovascular testing needed.  The patient may proceed to surgery at acceptable risk.   Antiplatelet and/or Anticoagulation Recommendations: Aspirin can be held for 7 days prior to her surgery.  Please resume Aspirin post operatively when it is felt to be safe from a bleeding standpoint. "  Anticipate pt can proceed with planned procedure barring acute status change.   VS: BP (!) 149/73   Pulse 63   Temp 36.8 C (Oral)   Ht '5\' 6"'$  (1.676 m)   Wt 81.6 kg   SpO2 99%   BMI 29.05 kg/m   PROVIDERS: McGowen, Adrian Blackwater, MD is PCP   Cardiologist - Dr. Fransico Him LABS: Labs reviewed: Acceptable for surgery. (all labs ordered are listed, but only abnormal results are displayed)  Labs Reviewed  CBC - Abnormal; Notable for the following components:      Result Value   RBC 3.68 (*)    Hemoglobin 11.4 (*)    HCT 34.0 (*)    All other components within normal limits  SURGICAL PCR SCREEN  BASIC METABOLIC PANEL  HEMOGLOBIN A1C  GLUCOSE,  CAPILLARY     IMAGES:   EKG:   CV: Myocardial Perfusion 12/03/2019 The left ventricular ejection fraction is hyperdynamic (>65%). Nuclear stress EF: 66%. There was no ST segment deviation noted during stress. The study is normal. This is a low risk study.   Normal resting and stress perfusion. No ischemia or infarction EF 66%  12/03/2019 1. Left ventricular ejection fraction, by estimation, is 55 to 60%. The  left ventricle has normal function. Left ventricular endocardial border  not optimally defined to evaluate regional wall motion. There is mild left  ventricular hypertrophy. Left  ventricular diastolic parameters are indeterminate.   2. Right ventricular systolic function is normal. The right ventricular  size is normal. There is mildly elevated pulmonary artery systolic  pressure. The estimated right ventricular systolic pressure is 72.0 mmHg.   3. The mitral valve is normal in structure. Trivial mitral valve  regurgitation. No evidence of mitral stenosis.   4. The aortic valve is tricuspid. Aortic valve regurgitation is not  visualized. No aortic stenosis is present.   5. The inferior vena cava is normal in size with greater than 50%  respiratory variability, suggesting right atrial pressure of 3 mmHg.  Past Medical History:  Diagnosis Date   Alcoholism in remission [facility])    states last alcohol 11 yrs ago   Anemia    Anxiety    Bipolar 1 disorder ([place])    Admission  for mania x 2 (most recent 11/2017)   Cancer [facility])    Cervical spondylosis without myelopathy 12/19/2013   MRI 02/2014: multilevel DDD/spondylosis, not much change compared to prior MRI.  Pt not in favor of invasive therapy for her neck as of 04/2014.   Cholelithiasis    COPD (chronic obstructive pulmonary disease) (HCC)    Moderate: anoro started 04/2017 by pulm, pt symptomatically improved and PFTs stable at f/u 06/2017.   Coronary artery spasm ([place]) 07/11/2018   nitrates=HA. Amlodipine=intol swelling.   Changed to coreg 09/03/18 by cardiologist.   Depression    Dilutional hyponatremia    Endometritis 05/2017   possible; empiric tx with Flagyl by GYN.   Environmental allergies    Fibromyalgia    GERD (gastroesophageal reflux disease)    Hepatic steatosis 03/2019   u/s abd   Herpes zoster 08/07/2017   L scapular region   Hiatal hernia    History of adenomatous polyp of colon 04/2008; 08/2014   No high grade dysplasia: recall 5 yrs ([name], [name])   History of basal cell carcinoma excision    NOSE   History of Helicobacter pylori infection 04/2008   +gastric biopsy (gastritis but no metaplasia, dysplasia, or malignancy identified)   History of kidney stones    History of TIA (transient ischemic attack)    secondary to HELLP syndrome 1994 post c/s--  residual memory loss   Hyperlipidemia    statin started after her NSTEMI: goal  LDL <70.   Hypertension    Internal hemorrhoids    Ischemic cardiomyopathy 05/2018   Echo EF 45-50% in context of NSTEMI from CA spasm. 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 04/2017.   Leg pain    ABIs 11/2019: normal    Memory loss    Abnl MRI brain and CT brain c/w chronic microvascular ischemia.  Repeat MRI brain 02/2014 stable (Dr. Mayra Reel with no plan to f/u with neuro as of 04/2014)   NSTEMI (non-ST elevated myocardial infarction) ([place]) 05/2018   Echo EF 45-50% in context of NSTEMI from CA spasm.// Myoview 8/21: EF 66, no ischemia, low risk   Osteoarthritis of left wrist    STT and 1st [place] jt   Osteoporosis 05/2010   2012 'penia; 06/2015 'porosis:  prolia.  DEXA 09/2017 T-score -2.2 femur neck. 11/2021 T score -2.2. Rpt 2 yrs   Pelvic floor dysfunction    [facility] ([name])   Pneumonia     Postmenopausal vaginal bleeding 05/2017   x 1 small episode; GYN attempted endo bx but unable to penetrate cervix due to severe cerv stenosis (due to postmenopausal state + hx of LEEP).  Endo u/s showed thin endo lining.  Per GYN---no evidence of endomet pathology on w/u---obs as of 07/2017.   Pre-diabetes    Prediabetes 06/2016   Fasting gluc 105; HbA1c at that time was 6.0%.  A1c 6.2% 12/2017.  A1c 5.7% Feb 2023.   Recurrent kidney stones 08/2013   Left ureteral calculus: Perc nephr--cystoscopy w/ureteroscopy + laser for stone removal.  Residual asymptomatic left renal nephrolithiasis <75m present post-procedure.  Right sided hydronephrosis-persistent (on u/s)--urol ordered CT to further eval 08/2016: 1.6 cm nonobstructing stone lower pole left kidney, no hydro, plan for PCN extraction ([facility]   Shortness  of breath    Sprain of neck 12/19/2013   Stroke (HCC)    TIA   TMJ (temporomandibular joint disorder)    USES  MOUTH GUARD   Toe fracture, left 12/2017   2nd toe prox phalanx (immobilization, [name])   Tubular adenoma of colon    Vitamin D deficiency 05/2011   Weak urinary stream 07/2016   with elevated PVR and mild left hydronephrosis secondary to this ([facility] started flomax 0.'4mg'$  qd for this).    Past Surgical History:  Procedure Laterality Date   ABIs  11/29/2019   NORMAL   ANTERIOR CERVICAL DECOMP/DISCECTOMY FUSION  06/30/2009   C3 -- C5  AND EXPLORATION OF FUSION C5-7 W/  PLATE REMOVAL   ARTHRODESIS METATARSALPHALANGEAL JOINT (MTPJ) Left 07/08/2021   Procedure: Left hallux metatarsophalangeal joint arthrodesis;  Surgeon: Wylene Simmer, MD;  Location: [place];  Service: Orthopedics;  Laterality: Left;   BACK SURGERY     BUNIONECTOMY Left    CARDIOVASCULAR STRESS TEST  12/03/2019   myoc perf im: NORMAL   CERVICAL FUSION  2002   anterior C5 -- C7   CERVICAL SPINE SURGERY  08/27/1999     C6 -- T1  LAMINECTOMY/  DISKECTOMY   CESAREAN SECTION   1994   CHOLECYSTECTOMY N/A 03/06/2020   Procedure: LAPAROSCOPIC CHOLECYSTECTOMY;  Surgeon: Felicie Morn, MD;  Location: [place];  Service: General;  Laterality: N/A;   COLONOSCOPY W/ POLYPECTOMY  04/2008;08/2014   2016 tubular adenoma x 1; +diverticulosis and int/ext hemorrhoids.  06/2020 adenoma, recall 5 yrs.   CYSTOSCOPY WITH URETEROSCOPY AND STENT PLACEMENT Left 08/28/2013   Procedure: CYSTOSCOPY, RGP,  WITH URETEROSCOPY AND STENT REMOVAL;  Surgeon: [name], MD;  Location: [facility];  Service: Urology;  Laterality: Left;   DEXA  09/2017   T-score -2.2 femur neck: improved compared to 06/2015.  2021 DEXA T score -2.3.  11/2021 DEXA T score -2.2.   ESOPHAGOGASTRODUODENOSCOPY  2010; 08/2014   2016 +Candidal esophagitis; Mild chronic gastritis w/intestinal metaplasia--NEG H pylori, neg for eosinophilic esoph. 09/9627 esoph candidiasis (diflucan x 20d), o/w normal.   HAMMER TOE SURGERY Left 07/08/2021   Procedure: Second metatarsal head excision; revision Second hammertoe correction; 2-5 percutaneus flexor tenotomies;  Surgeon: Wylene Simmer, MD;  Location: [place];  Service: Orthopedics;  Laterality: Left;   HARDWARE REMOVAL Left 07/08/2021   Procedure: Removal of deep implants from hallux proximal phalanx and First and Second metatarsals;  Surgeon: Wylene Simmer, MD;  Location: [place];  Service: Orthopedics;  Laterality: Left;   HOLMIUM LASER APPLICATION Left 52/84/1324   Procedure: HOLMIUM LASER APPLICATION;  Surgeon: [name], MD;  Location: [facility];  Service: Urology;  Laterality: Left;   JOINT REPLACEMENT     LEFT HEART CATH AND CORONARY ANGIOGRAPHY N/A 05/15/2018   Evidence for spasm in distal LAD.  Mod RCA atherosclerosis, o/w no CAD.  Procedure: LEFT HEART CATH AND CORONARY ANGIOGRAPHY;  Surgeon: Leonie Man, MD;  Location: [place] [facility];  Service: Cardiovascular;  Laterality: N/A;   NEGATIVE  SLEEP STUDY  04/01/2007   NEPHROLITHOTOMY Left 08/05/2013   Procedure: NEPHROLITHOTOMY PERCUTANEOUS;  Surgeon: [name], MD;  Location: [facility];  Service: Urology;  Laterality: Left;   POLYSOMNOGRAM  01/2019   NORMAL   POLYSOMNOGRAPHY  10/25/2018   Dr. Dion Saucier due to pt inadequat sleep time (83 min).   [facility]  TOTAL KNEE ARTHROPLASTY Right 2003   TRANSTHORACIC ECHOCARDIOGRAM  05/2018; 12/03/2019   (after MI secondary to arterial spasm): EF 45-50%; severe hypokinesis of apical anterolateral, inferolateral , anterior, and inferior LV myocardium. 11/2019 EF 55-60%, valves fine   ULTRASOUND EXAM,PELVIC COMPLETE ([facility] HX)  06/25/2015   NORMAL (done by GYN)    MEDICATIONS:  acetaminophen (TYLENOL) 500 MG tablet   albuterol (VENTOLIN HFA) 108 (90 Base) MCG/ACT inhaler   ALPRAZolam (XANAX) 0.5 MG tablet   Ascorbic Acid (VITAMIN C PO)   aspirin EC 81 MG EC tablet   atorvastatin (LIPITOR) 80 MG tablet   buPROPion (WELLBUTRIN XL) 150 MG 24 hr tablet   Cholecalciferol (VITAMIN D-3 PO)   citalopram (CELEXA) 20 MG tablet   cyclobenzaprine (FLEXERIL) 10 MG tablet   famotidine (PEPCID) 40 MG tablet   fluticasone (FLONASE) 50 MCG/ACT nasal spray   hydrochlorothiazide (HYDRODIURIL) 12.5 MG tablet   hydrochlorothiazide (HYDRODIURIL) 25 MG tablet   KLOR-CON M20 20 MEQ tablet   lamoTRIgine (LAMICTAL) 100 MG tablet   losartan (COZAAR) 25 MG tablet   losartan (COZAAR) 50 MG tablet   MAGNESIUM OXIDE PO   meloxicam (MOBIC) 15 MG tablet   metoprolol succinate (TOPROL-XL) 100 MG 24 hr tablet   montelukast (SINGULAIR) 10 MG tablet   Multiple Vitamins-Minerals (ZINC PO)   nitroGLYCERIN (NITROSTAT) 0.4 MG SL tablet   pantoprazole (PROTONIX) 40 MG tablet   Tiotropium Bromide-Olodaterol (STIOLTO RESPIMAT) 2.5-2.5 MCG/ACT AERS   zolpidem (AMBIEN) 10 MG tablet   No current facility-administered medications for this encounter.   Konrad Felix Ward,  PA-C [facility] Pre-Surgical Testing [phone]

## 2022-01-10 ENCOUNTER — Encounter: Payer: Self-pay | Admitting: Physical Therapy

## 2022-01-13 ENCOUNTER — Encounter: Payer: Self-pay | Admitting: Physical Therapy

## 2022-01-17 ENCOUNTER — Encounter: Payer: Self-pay | Admitting: Physical Therapy

## 2022-01-19 ENCOUNTER — Encounter (HOSPITAL_COMMUNITY)
Admission: RE | Disposition: A | Discharge status: Home / Self Care | Payer: Self-pay | Source: Home / Self Care | Attending: Orthopedic Surgery

## 2022-01-19 ENCOUNTER — Inpatient Hospital Stay (HOSPITAL_COMMUNITY)
Admission: RE | Admit: 2022-01-19 | Discharge: 2022-01-20 | DRG: 468 | Disposition: A | Discharge status: Home / Self Care | Payer: Medicare Other | Attending: Orthopedic Surgery | Admitting: Orthopedic Surgery

## 2022-01-19 ENCOUNTER — Encounter (HOSPITAL_COMMUNITY): Payer: Self-pay | Admitting: Orthopedic Surgery

## 2022-01-19 ENCOUNTER — Inpatient Hospital Stay (HOSPITAL_COMMUNITY): Payer: Medicare Other | Admitting: Physician Assistant

## 2022-01-19 ENCOUNTER — Inpatient Hospital Stay (HOSPITAL_COMMUNITY): Payer: Medicare Other | Admitting: Certified Registered"

## 2022-01-19 ENCOUNTER — Other Ambulatory Visit: Payer: Self-pay

## 2022-01-19 DIAGNOSIS — Z96651 Presence of right artificial knee joint: Secondary | ICD-10-CM | POA: Diagnosis not present

## 2022-01-19 DIAGNOSIS — D631 Anemia in chronic kidney disease: Secondary | ICD-10-CM | POA: Diagnosis present

## 2022-01-19 DIAGNOSIS — T84022A Instability of internal right knee prosthesis, initial encounter: Secondary | ICD-10-CM | POA: Diagnosis not present

## 2022-01-19 DIAGNOSIS — T84093A Other mechanical complication of internal left knee prosthesis, initial encounter: Secondary | ICD-10-CM | POA: Diagnosis not present

## 2022-01-19 DIAGNOSIS — I129 Hypertensive chronic kidney disease with stage 1 through stage 4 chronic kidney disease, or unspecified chronic kidney disease: Secondary | ICD-10-CM | POA: Diagnosis not present

## 2022-01-19 DIAGNOSIS — F1021 Alcohol dependence, in remission: Secondary | ICD-10-CM | POA: Diagnosis present

## 2022-01-19 DIAGNOSIS — I252 Old myocardial infarction: Secondary | ICD-10-CM | POA: Diagnosis not present

## 2022-01-19 DIAGNOSIS — Z888 Allergy status to other drugs, medicaments and biological substances status: Secondary | ICD-10-CM

## 2022-01-19 DIAGNOSIS — E1122 Type 2 diabetes mellitus with diabetic chronic kidney disease: Secondary | ICD-10-CM | POA: Diagnosis not present

## 2022-01-19 DIAGNOSIS — E785 Hyperlipidemia, unspecified: Secondary | ICD-10-CM | POA: Diagnosis present

## 2022-01-19 DIAGNOSIS — K219 Gastro-esophageal reflux disease without esophagitis: Secondary | ICD-10-CM | POA: Diagnosis present

## 2022-01-19 DIAGNOSIS — K5909 Other constipation: Secondary | ICD-10-CM | POA: Diagnosis present

## 2022-01-19 DIAGNOSIS — J449 Chronic obstructive pulmonary disease, unspecified: Secondary | ICD-10-CM | POA: Diagnosis not present

## 2022-01-19 DIAGNOSIS — I509 Heart failure, unspecified: Secondary | ICD-10-CM | POA: Diagnosis not present

## 2022-01-19 DIAGNOSIS — T84018A Broken internal joint prosthesis, other site, initial encounter: Secondary | ICD-10-CM

## 2022-01-19 DIAGNOSIS — M797 Fibromyalgia: Secondary | ICD-10-CM | POA: Diagnosis not present

## 2022-01-19 DIAGNOSIS — Z7982 Long term (current) use of aspirin: Secondary | ICD-10-CM

## 2022-01-19 DIAGNOSIS — Z87891 Personal history of nicotine dependence: Secondary | ICD-10-CM

## 2022-01-19 DIAGNOSIS — I110 Hypertensive heart disease with heart failure: Secondary | ICD-10-CM

## 2022-01-19 DIAGNOSIS — I2510 Atherosclerotic heart disease of native coronary artery without angina pectoris: Secondary | ICD-10-CM

## 2022-01-19 DIAGNOSIS — F419 Anxiety disorder, unspecified: Secondary | ICD-10-CM | POA: Diagnosis present

## 2022-01-19 DIAGNOSIS — Z86010 Personal history of colonic polyps: Secondary | ICD-10-CM

## 2022-01-19 DIAGNOSIS — Z811 Family history of alcohol abuse and dependence: Secondary | ICD-10-CM

## 2022-01-19 DIAGNOSIS — Z981 Arthrodesis status: Secondary | ICD-10-CM

## 2022-01-19 DIAGNOSIS — Z79899 Other long term (current) drug therapy: Secondary | ICD-10-CM

## 2022-01-19 DIAGNOSIS — G8929 Other chronic pain: Secondary | ICD-10-CM | POA: Diagnosis not present

## 2022-01-19 DIAGNOSIS — Y792 Prosthetic and other implants, materials and accessory orthopedic devices associated with adverse incidents: Secondary | ICD-10-CM | POA: Diagnosis present

## 2022-01-19 DIAGNOSIS — K760 Fatty (change of) liver, not elsewhere classified: Secondary | ICD-10-CM | POA: Diagnosis not present

## 2022-01-19 DIAGNOSIS — G8918 Other acute postprocedural pain: Secondary | ICD-10-CM | POA: Diagnosis not present

## 2022-01-19 DIAGNOSIS — N182 Chronic kidney disease, stage 2 (mild): Secondary | ICD-10-CM | POA: Diagnosis not present

## 2022-01-19 DIAGNOSIS — T84012A Broken internal right knee prosthesis, initial encounter: Secondary | ICD-10-CM

## 2022-01-19 DIAGNOSIS — M810 Age-related osteoporosis without current pathological fracture: Secondary | ICD-10-CM | POA: Diagnosis present

## 2022-01-19 DIAGNOSIS — Z79891 Long term (current) use of opiate analgesic: Secondary | ICD-10-CM

## 2022-01-19 DIAGNOSIS — Z85828 Personal history of other malignant neoplasm of skin: Secondary | ICD-10-CM | POA: Diagnosis not present

## 2022-01-19 DIAGNOSIS — Z96659 Presence of unspecified artificial knee joint: Principal | ICD-10-CM

## 2022-01-19 DIAGNOSIS — Z9049 Acquired absence of other specified parts of digestive tract: Secondary | ICD-10-CM

## 2022-01-19 DIAGNOSIS — Z833 Family history of diabetes mellitus: Secondary | ICD-10-CM

## 2022-01-19 DIAGNOSIS — T84032A Mechanical loosening of internal right knee prosthetic joint, initial encounter: Secondary | ICD-10-CM | POA: Diagnosis not present

## 2022-01-19 DIAGNOSIS — Z87442 Personal history of urinary calculi: Secondary | ICD-10-CM

## 2022-01-19 DIAGNOSIS — I255 Ischemic cardiomyopathy: Secondary | ICD-10-CM | POA: Diagnosis present

## 2022-01-19 DIAGNOSIS — T84062A Wear of articular bearing surface of internal prosthetic right knee joint, initial encounter: Principal | ICD-10-CM | POA: Diagnosis present

## 2022-01-19 DIAGNOSIS — F319 Bipolar disorder, unspecified: Secondary | ICD-10-CM | POA: Diagnosis present

## 2022-01-19 DIAGNOSIS — M19032 Primary osteoarthritis, left wrist: Secondary | ICD-10-CM | POA: Diagnosis not present

## 2022-01-19 DIAGNOSIS — Z83719 Family history of colon polyps, unspecified: Secondary | ICD-10-CM

## 2022-01-19 DIAGNOSIS — Z8261 Family history of arthritis: Secondary | ICD-10-CM

## 2022-01-19 DIAGNOSIS — Z881 Allergy status to other antibiotic agents status: Secondary | ICD-10-CM

## 2022-01-19 DIAGNOSIS — Z8619 Personal history of other infectious and parasitic diseases: Secondary | ICD-10-CM

## 2022-01-19 DIAGNOSIS — Z6829 Body mass index (BMI) 29.0-29.9, adult: Secondary | ICD-10-CM

## 2022-01-19 DIAGNOSIS — Z8673 Personal history of transient ischemic attack (TIA), and cerebral infarction without residual deficits: Secondary | ICD-10-CM

## 2022-01-19 DIAGNOSIS — Z885 Allergy status to narcotic agent status: Secondary | ICD-10-CM

## 2022-01-19 DIAGNOSIS — Z8249 Family history of ischemic heart disease and other diseases of the circulatory system: Secondary | ICD-10-CM

## 2022-01-19 DIAGNOSIS — M6281 Muscle weakness (generalized): Secondary | ICD-10-CM | POA: Diagnosis not present

## 2022-01-19 HISTORY — PX: TOTAL KNEE REVISION: SHX996

## 2022-01-19 LAB — GLUCOSE, CAPILLARY: Glucose-Capillary: 129 mg/dL — ABNORMAL HIGH (ref 70–99)

## 2022-01-19 SURGERY — TOTAL KNEE REVISION
Anesthesia: Monitor Anesthesia Care | Site: Knee | Laterality: Right

## 2022-01-19 MED ORDER — CITALOPRAM HYDROBROMIDE 20 MG PO TABS
20.0000 mg | ORAL_TABLET | Freq: Two times a day (BID) | ORAL | Status: DC
  Administered 2022-01-19 – 2022-01-20 (×2): 20 mg via ORAL
  Filled 2022-01-19 (×2): qty 1

## 2022-01-19 MED ORDER — BUPROPION HCL ER (XL) 150 MG PO TB24
150.0000 mg | ORAL_TABLET | Freq: Every day | ORAL | Status: DC
  Administered 2022-01-20: 150 mg via ORAL
  Filled 2022-01-19: qty 1

## 2022-01-19 MED ORDER — ACETAMINOPHEN 10 MG/ML IV SOLN: 1000.0000 mg | Freq: Once | INTRAVENOUS | Status: DC | PRN

## 2022-01-19 MED ORDER — METHOCARBAMOL 500 MG IVPB - SIMPLE MED
INTRAVENOUS | Status: AC
  Filled 2022-01-19: qty 55

## 2022-01-19 MED ORDER — DEXAMETHASONE SODIUM PHOSPHATE 10 MG/ML IJ SOLN
INTRAMUSCULAR | Status: DC | PRN
  Administered 2022-01-19: 8 mg via INTRAVENOUS

## 2022-01-19 MED ORDER — FENTANYL CITRATE (PF) 100 MCG/2ML IJ SOLN
INTRAMUSCULAR | Status: AC
  Filled 2022-01-19: qty 2

## 2022-01-19 MED ORDER — VANCOMYCIN HCL IN DEXTROSE 1-5 GM/200ML-% IV SOLN
1000.0000 mg | Freq: Two times a day (BID) | INTRAVENOUS | Status: AC
  Administered 2022-01-19: 1000 mg via INTRAVENOUS
  Filled 2022-01-19: qty 200

## 2022-01-19 MED ORDER — PROPOFOL 1000 MG/100ML IV EMUL
INTRAVENOUS | Status: AC
  Filled 2022-01-19: qty 100

## 2022-01-19 MED ORDER — OXYCODONE HCL 5 MG PO TABS
ORAL_TABLET | ORAL | Status: AC
  Filled 2022-01-19: qty 1

## 2022-01-19 MED ORDER — DOCUSATE SODIUM 100 MG PO CAPS
100.0000 mg | ORAL_CAPSULE | Freq: Two times a day (BID) | ORAL | Status: DC
  Administered 2022-01-19 – 2022-01-20 (×2): 100 mg via ORAL
  Filled 2022-01-19 (×2): qty 1

## 2022-01-19 MED ORDER — 0.9 % SODIUM CHLORIDE (POUR BTL) OPTIME
TOPICAL | Status: DC | PRN
  Administered 2022-01-19: 1000 mL

## 2022-01-19 MED ORDER — METHOCARBAMOL 500 MG IVPB - SIMPLE MED
500.0000 mg | Freq: Four times a day (QID) | INTRAVENOUS | Status: DC | PRN
  Administered 2022-01-19: 500 mg via INTRAVENOUS

## 2022-01-19 MED ORDER — METOPROLOL SUCCINATE ER 50 MG PO TB24
100.0000 mg | ORAL_TABLET | Freq: Every day | ORAL | Status: DC
  Administered 2022-01-19: 100 mg via ORAL
  Filled 2022-01-19: qty 2

## 2022-01-19 MED ORDER — NITROGLYCERIN 0.4 MG SL SUBL: 0.4000 mg | SUBLINGUAL_TABLET | SUBLINGUAL | Status: DC | PRN

## 2022-01-19 MED ORDER — PANTOPRAZOLE SODIUM 40 MG PO TBEC
40.0000 mg | DELAYED_RELEASE_TABLET | Freq: Every day | ORAL | Status: DC
  Administered 2022-01-19 – 2022-01-20 (×2): 40 mg via ORAL
  Filled 2022-01-19 (×2): qty 1

## 2022-01-19 MED ORDER — LACTATED RINGERS IV SOLN: INTRAVENOUS | Status: DC

## 2022-01-19 MED ORDER — BUPIVACAINE LIPOSOME 1.3 % IJ SUSP: 20.0000 mL | Freq: Once | INTRAMUSCULAR | Status: DC

## 2022-01-19 MED ORDER — HYDROMORPHONE HCL 1 MG/ML IJ SOLN
0.5000 mg | INTRAMUSCULAR | Status: DC | PRN
  Administered 2022-01-19: 1 mg via INTRAVENOUS
  Filled 2022-01-19: qty 1

## 2022-01-19 MED ORDER — ONDANSETRON HCL 4 MG PO TABS: 4.0000 mg | ORAL_TABLET | Freq: Four times a day (QID) | ORAL | Status: DC | PRN

## 2022-01-19 MED ORDER — BISACODYL 10 MG RE SUPP: 10.0000 mg | Freq: Every day | RECTAL | Status: DC | PRN

## 2022-01-19 MED ORDER — PROPOFOL 10 MG/ML IV BOLUS
INTRAVENOUS | Status: AC
  Filled 2022-01-19: qty 20

## 2022-01-19 MED ORDER — SODIUM CHLORIDE 0.9 % IR SOLN
Status: DC | PRN
  Administered 2022-01-19: 1000 mL

## 2022-01-19 MED ORDER — FLEET ENEMA 7-19 GM/118ML RE ENEM: 1.0000 | ENEMA | Freq: Once | RECTAL | Status: DC | PRN

## 2022-01-19 MED ORDER — MIDAZOLAM HCL 2 MG/2ML IJ SOLN
INTRAMUSCULAR | Status: DC | PRN
  Administered 2022-01-19 (×2): 1 mg via INTRAVENOUS

## 2022-01-19 MED ORDER — ARFORMOTEROL TARTRATE 15 MCG/2ML IN NEBU
15.0000 ug | INHALATION_SOLUTION | Freq: Two times a day (BID) | RESPIRATORY_TRACT | Status: DC
  Filled 2022-01-19 (×4): qty 2

## 2022-01-19 MED ORDER — SODIUM CHLORIDE (PF) 0.9 % IJ SOLN
INTRAMUSCULAR | Status: AC
  Filled 2022-01-19: qty 10

## 2022-01-19 MED ORDER — POVIDONE-IODINE 10 % EX SWAB: Freq: Once | CUTANEOUS | Status: AC

## 2022-01-19 MED ORDER — ROPIVACAINE HCL 7.5 MG/ML IJ SOLN
INTRAMUSCULAR | Status: DC | PRN
  Administered 2022-01-19: 20 mL via PERINEURAL

## 2022-01-19 MED ORDER — FENTANYL CITRATE PF 50 MCG/ML IJ SOSY
25.0000 ug | PREFILLED_SYRINGE | INTRAMUSCULAR | Status: DC | PRN
  Administered 2022-01-19: 50 ug via INTRAVENOUS

## 2022-01-19 MED ORDER — ORAL CARE MOUTH RINSE: 15.0000 mL | Freq: Once | OROMUCOSAL | Status: AC

## 2022-01-19 MED ORDER — PHENYLEPHRINE HCL-NACL 20-0.9 MG/250ML-% IV SOLN
INTRAVENOUS | Status: DC | PRN
  Administered 2022-01-19: 25 ug/min via INTRAVENOUS

## 2022-01-19 MED ORDER — METOCLOPRAMIDE HCL 5 MG/ML IJ SOLN: 5.0000 mg | Freq: Three times a day (TID) | INTRAMUSCULAR | Status: DC | PRN

## 2022-01-19 MED ORDER — LOSARTAN POTASSIUM 25 MG PO TABS: 25.0000 mg | ORAL_TABLET | Freq: Every day | ORAL | Status: DC

## 2022-01-19 MED ORDER — ALPRAZOLAM 0.5 MG PO TABS
0.5000 mg | ORAL_TABLET | Freq: Three times a day (TID) | ORAL | Status: DC | PRN
  Administered 2022-01-19 – 2022-01-20 (×3): 0.5 mg via ORAL
  Filled 2022-01-19 (×3): qty 1

## 2022-01-19 MED ORDER — POLYETHYLENE GLYCOL 3350 17 G PO PACK: 17.0000 g | PACK | Freq: Every day | ORAL | Status: DC | PRN

## 2022-01-19 MED ORDER — PROPOFOL 500 MG/50ML IV EMUL
INTRAVENOUS | Status: DC | PRN
  Administered 2022-01-19: 125 ug/kg/min via INTRAVENOUS

## 2022-01-19 MED ORDER — FENTANYL CITRATE (PF) 100 MCG/2ML IJ SOLN
INTRAMUSCULAR | Status: DC | PRN
  Administered 2022-01-19: 50 ug via INTRAVENOUS

## 2022-01-19 MED ORDER — SODIUM CHLORIDE (PF) 0.9 % IJ SOLN
INTRAMUSCULAR | Status: DC | PRN
  Administered 2022-01-19: 60 mL

## 2022-01-19 MED ORDER — DEXAMETHASONE SODIUM PHOSPHATE 10 MG/ML IJ SOLN: 8.0000 mg | Freq: Once | INTRAMUSCULAR | Status: DC

## 2022-01-19 MED ORDER — PROPOFOL 10 MG/ML IV BOLUS
INTRAVENOUS | Status: DC | PRN
  Administered 2022-01-19: 20 mg via INTRAVENOUS

## 2022-01-19 MED ORDER — ATORVASTATIN CALCIUM 40 MG PO TABS
80.0000 mg | ORAL_TABLET | Freq: Every day | ORAL | Status: DC
  Administered 2022-01-20: 80 mg via ORAL
  Filled 2022-01-19: qty 2

## 2022-01-19 MED ORDER — BUPIVACAINE LIPOSOME 1.3 % IJ SUSP
INTRAMUSCULAR | Status: DC | PRN
  Administered 2022-01-19: 20 mL

## 2022-01-19 MED ORDER — SODIUM CHLORIDE 0.9 % IV SOLN: INTRAVENOUS | Status: DC

## 2022-01-19 MED ORDER — ALBUTEROL SULFATE (2.5 MG/3ML) 0.083% IN NEBU: 3.0000 mL | INHALATION_SOLUTION | Freq: Four times a day (QID) | RESPIRATORY_TRACT | Status: DC | PRN

## 2022-01-19 MED ORDER — LAMOTRIGINE 100 MG PO TABS
300.0000 mg | ORAL_TABLET | Freq: Every day | ORAL | Status: DC
  Administered 2022-01-19: 300 mg via ORAL
  Filled 2022-01-19: qty 3

## 2022-01-19 MED ORDER — UMECLIDINIUM BROMIDE 62.5 MCG/ACT IN AEPB
1.0000 | INHALATION_SPRAY | Freq: Every day | RESPIRATORY_TRACT | Status: DC
  Filled 2022-01-19: qty 7

## 2022-01-19 MED ORDER — FAMOTIDINE 20 MG PO TABS: 40.0000 mg | ORAL_TABLET | Freq: Every day | ORAL | Status: DC | PRN

## 2022-01-19 MED ORDER — FENTANYL CITRATE PF 50 MCG/ML IJ SOSY
PREFILLED_SYRINGE | INTRAMUSCULAR | Status: AC
  Filled 2022-01-19: qty 1

## 2022-01-19 MED ORDER — ZOLPIDEM TARTRATE 10 MG PO TABS
10.0000 mg | ORAL_TABLET | Freq: Every day | ORAL | Status: DC
  Administered 2022-01-19: 10 mg via ORAL
  Filled 2022-01-19: qty 1

## 2022-01-19 MED ORDER — PHENOL 1.4 % MT LIQD: 1.0000 | OROMUCOSAL | Status: DC | PRN

## 2022-01-19 MED ORDER — HYDROCHLOROTHIAZIDE 12.5 MG PO TABS
12.5000 mg | ORAL_TABLET | Freq: Every day | ORAL | Status: DC
  Administered 2022-01-20: 12.5 mg via ORAL
  Filled 2022-01-19: qty 1

## 2022-01-19 MED ORDER — ACETAMINOPHEN 500 MG PO TABS
1000.0000 mg | ORAL_TABLET | Freq: Four times a day (QID) | ORAL | Status: AC
  Administered 2022-01-19 – 2022-01-20 (×3): 1000 mg via ORAL
  Filled 2022-01-19 (×3): qty 2

## 2022-01-19 MED ORDER — PHENYLEPHRINE HCL (PRESSORS) 10 MG/ML IV SOLN
INTRAVENOUS | Status: AC
  Filled 2022-01-19: qty 1

## 2022-01-19 MED ORDER — SODIUM CHLORIDE (PF) 0.9 % IJ SOLN
INTRAMUSCULAR | Status: AC
  Filled 2022-01-19: qty 50

## 2022-01-19 MED ORDER — LIDOCAINE 2% (20 MG/ML) 5 ML SYRINGE
INTRAMUSCULAR | Status: DC | PRN
  Administered 2022-01-19: 40 mg via INTRAVENOUS

## 2022-01-19 MED ORDER — VANCOMYCIN HCL IN DEXTROSE 1-5 GM/200ML-% IV SOLN
1000.0000 mg | INTRAVENOUS | Status: AC
  Administered 2022-01-19: 1000 mg via INTRAVENOUS
  Filled 2022-01-19: qty 200

## 2022-01-19 MED ORDER — ONDANSETRON HCL 4 MG/2ML IJ SOLN: 4.0000 mg | Freq: Four times a day (QID) | INTRAMUSCULAR | Status: DC | PRN

## 2022-01-19 MED ORDER — ASPIRIN 325 MG PO TBEC
325.0000 mg | DELAYED_RELEASE_TABLET | Freq: Two times a day (BID) | ORAL | Status: DC
  Administered 2022-01-20: 325 mg via ORAL
  Filled 2022-01-19: qty 1

## 2022-01-19 MED ORDER — METHOCARBAMOL 500 MG PO TABS
500.0000 mg | ORAL_TABLET | Freq: Four times a day (QID) | ORAL | Status: DC | PRN
  Administered 2022-01-19 – 2022-01-20 (×3): 500 mg via ORAL
  Filled 2022-01-19 (×5): qty 1

## 2022-01-19 MED ORDER — CHLORHEXIDINE GLUCONATE 0.12 % MT SOLN
15.0000 mL | Freq: Once | OROMUCOSAL | Status: AC
  Administered 2022-01-19: 15 mL via OROMUCOSAL

## 2022-01-19 MED ORDER — HYDROMORPHONE HCL 1 MG/ML IJ SOLN
0.5000 mg | INTRAMUSCULAR | Status: DC | PRN
  Administered 2022-01-19 – 2022-01-20 (×3): 1 mg via INTRAVENOUS
  Filled 2022-01-19 (×3): qty 1

## 2022-01-19 MED ORDER — FLUTICASONE PROPIONATE 50 MCG/ACT NA SUSP
2.0000 | Freq: Every day | NASAL | Status: DC
  Administered 2022-01-20: 2 via NASAL
  Filled 2022-01-19: qty 16

## 2022-01-19 MED ORDER — ONDANSETRON HCL 4 MG/2ML IJ SOLN
INTRAMUSCULAR | Status: DC | PRN
  Administered 2022-01-19: 4 mg via INTRAVENOUS

## 2022-01-19 MED ORDER — MONTELUKAST SODIUM 10 MG PO TABS
10.0000 mg | ORAL_TABLET | Freq: Every day | ORAL | Status: DC
  Administered 2022-01-19: 10 mg via ORAL
  Filled 2022-01-19: qty 1

## 2022-01-19 MED ORDER — TRANEXAMIC ACID-NACL 1000-0.7 MG/100ML-% IV SOLN
1000.0000 mg | INTRAVENOUS | Status: AC
  Administered 2022-01-19: 1000 mg via INTRAVENOUS
  Filled 2022-01-19: qty 100

## 2022-01-19 MED ORDER — BUPIVACAINE IN DEXTROSE 0.75-8.25 % IT SOLN
INTRATHECAL | Status: DC | PRN
  Administered 2022-01-19: 1.6 mL via INTRATHECAL

## 2022-01-19 MED ORDER — DEXAMETHASONE SODIUM PHOSPHATE 10 MG/ML IJ SOLN
10.0000 mg | Freq: Once | INTRAMUSCULAR | Status: AC
  Administered 2022-01-20: 10 mg via INTRAVENOUS
  Filled 2022-01-19: qty 1

## 2022-01-19 MED ORDER — MIDAZOLAM HCL 2 MG/2ML IJ SOLN
INTRAMUSCULAR | Status: AC
  Filled 2022-01-19: qty 2

## 2022-01-19 MED ORDER — METOCLOPRAMIDE HCL 5 MG PO TABS: 5.0000 mg | ORAL_TABLET | Freq: Three times a day (TID) | ORAL | Status: DC | PRN

## 2022-01-19 MED ORDER — POTASSIUM CHLORIDE CRYS ER 20 MEQ PO TBCR
20.0000 meq | EXTENDED_RELEASE_TABLET | Freq: Every day | ORAL | Status: DC
  Administered 2022-01-19 – 2022-01-20 (×2): 20 meq via ORAL
  Filled 2022-01-19 (×2): qty 1

## 2022-01-19 MED ORDER — OXYCODONE HCL 5 MG PO TABS
5.0000 mg | ORAL_TABLET | ORAL | Status: DC | PRN
  Administered 2022-01-19: 5 mg via ORAL
  Administered 2022-01-19 – 2022-01-20 (×5): 10 mg via ORAL
  Filled 2022-01-19 (×5): qty 2

## 2022-01-19 MED ORDER — DIPHENHYDRAMINE HCL 12.5 MG/5ML PO ELIX
12.5000 mg | ORAL_SOLUTION | ORAL | Status: DC | PRN
  Administered 2022-01-20: 25 mg via ORAL
  Filled 2022-01-19: qty 10

## 2022-01-19 MED ORDER — MIDAZOLAM HCL 2 MG/2ML IJ SOLN
1.0000 mg | INTRAMUSCULAR | Status: DC
  Administered 2022-01-19: 2 mg via INTRAVENOUS
  Filled 2022-01-19: qty 2

## 2022-01-19 MED ORDER — ACETAMINOPHEN 10 MG/ML IV SOLN
1000.0000 mg | Freq: Four times a day (QID) | INTRAVENOUS | Status: DC
  Administered 2022-01-19: 1000 mg via INTRAVENOUS
  Filled 2022-01-19: qty 100

## 2022-01-19 MED ORDER — MENTHOL 3 MG MT LOZG: 1.0000 | LOZENGE | OROMUCOSAL | Status: DC | PRN

## 2022-01-19 MED ORDER — FENTANYL CITRATE PF 50 MCG/ML IJ SOSY
50.0000 ug | PREFILLED_SYRINGE | INTRAMUSCULAR | Status: DC
  Administered 2022-01-19: 50 ug via INTRAVENOUS
  Filled 2022-01-19: qty 2

## 2022-01-19 MED ORDER — BUPIVACAINE LIPOSOME 1.3 % IJ SUSP
INTRAMUSCULAR | Status: AC
  Filled 2022-01-19: qty 20

## 2022-01-19 SURGICAL SUPPLY — 59 items
BAG COUNTER SPONGE SURGICOUNT (BAG) IMPLANT
BAG DECANTER FOR FLEXI CONT (MISCELLANEOUS) ×1 IMPLANT
BAG ZIPLOCK 12X15 (MISCELLANEOUS) IMPLANT
BLADE SAG 18X100X1.27 (BLADE) ×1 IMPLANT
BLADE SAW SGTL 11.0X1.19X90.0M (BLADE) ×1 IMPLANT
BLADE SURG SZ10 CARB STEEL (BLADE) IMPLANT
BNDG ELASTIC 6X5.8 VLCR STR LF (GAUZE/BANDAGES/DRESSINGS) ×1 IMPLANT
COVER SURGICAL LIGHT HANDLE (MISCELLANEOUS) ×1 IMPLANT
CUFF TOURN SGL QUICK 34 (TOURNIQUET CUFF) ×1
CUFF TRNQT CYL 34X4.125X (TOURNIQUET CUFF) ×1 IMPLANT
DRAPE INCISE IOBAN 66X45 STRL (DRAPES) ×1 IMPLANT
DRAPE U-SHAPE 47X51 STRL (DRAPES) ×1 IMPLANT
DRSG ADAPTIC 3X8 NADH LF (GAUZE/BANDAGES/DRESSINGS) ×1 IMPLANT
DRSG AQUACEL AG ADV 3.5X10 (GAUZE/BANDAGES/DRESSINGS) IMPLANT
DURAPREP 26ML APPLICATOR (WOUND CARE) ×1 IMPLANT
ELECT REM PT RETURN 15FT ADLT (MISCELLANEOUS) ×1 IMPLANT
EVACUATOR 1/8 PVC DRAIN (DRAIN) IMPLANT
GAUZE PAD ABD 8X10 STRL (GAUZE/BANDAGES/DRESSINGS) ×1 IMPLANT
GAUZE SPONGE 4X4 12PLY STRL (GAUZE/BANDAGES/DRESSINGS) ×1 IMPLANT
GLOVE BIO SURGEON STRL SZ 6.5 (GLOVE) ×2 IMPLANT
GLOVE BIO SURGEON STRL SZ7 (GLOVE) ×1 IMPLANT
GLOVE BIO SURGEON STRL SZ8 (GLOVE) ×2 IMPLANT
GLOVE BIOGEL PI IND STRL 7.0 (GLOVE) ×1 IMPLANT
GLOVE BIOGEL PI IND STRL 8 (GLOVE) ×1 IMPLANT
GLOVE BIOGEL PI IND STRL 8.5 (GLOVE) IMPLANT
GOWN SPEC L4 XLG W/TWL (GOWN DISPOSABLE) ×2 IMPLANT
GOWN STRL REUS W/ TWL LRG LVL3 (GOWN DISPOSABLE) ×1 IMPLANT
GOWN STRL REUS W/ TWL XL LVL3 (GOWN DISPOSABLE) IMPLANT
GOWN STRL REUS W/TWL LRG LVL3 (GOWN DISPOSABLE) ×1
GOWN STRL REUS W/TWL XL LVL3 (GOWN DISPOSABLE)
HANDPIECE INTERPULSE COAX TIP (DISPOSABLE) ×1
HOLDER FOLEY CATH W/STRAP (MISCELLANEOUS) IMPLANT
IMMOBILIZER KNEE 20 (SOFTGOODS) ×1
IMMOBILIZER KNEE 20 THIGH 36 (SOFTGOODS) ×1 IMPLANT
INSERT TIBIAL FIXD BEARING SZ3 (Orthopedic Implant) IMPLANT
KIT TURNOVER KIT A (KITS) IMPLANT
MANIFOLD NEPTUNE II (INSTRUMENTS) ×1 IMPLANT
NS IRRIG 1000ML POUR BTL (IV SOLUTION) ×1 IMPLANT
PACK TOTAL KNEE CUSTOM (KITS) ×1 IMPLANT
PADDING CAST COTTON 6X4 STRL (CAST SUPPLIES) ×2 IMPLANT
PROTECTOR NERVE ULNAR (MISCELLANEOUS) ×1 IMPLANT
SET HNDPC FAN SPRY TIP SCT (DISPOSABLE) ×1 IMPLANT
SPIKE FLUID TRANSFER (MISCELLANEOUS) IMPLANT
STRIP CLOSURE SKIN 1/2X4 (GAUZE/BANDAGES/DRESSINGS) ×1 IMPLANT
SUT MNCRL AB 4-0 PS2 18 (SUTURE) ×1 IMPLANT
SUT STRATAFIX 0 PDS 27 VIOLET (SUTURE) ×1
SUT VIC AB 2-0 CT1 27 (SUTURE) ×3
SUT VIC AB 2-0 CT1 TAPERPNT 27 (SUTURE) ×3 IMPLANT
SUTURE STRATFX 0 PDS 27 VIOLET (SUTURE) ×1 IMPLANT
SWAB COLLECTION DEVICE MRSA (MISCELLANEOUS) IMPLANT
SWAB CULTURE ESWAB REG 1ML (MISCELLANEOUS) IMPLANT
SYR 50ML LL SCALE MARK (SYRINGE) ×2 IMPLANT
TIBIAL INSERT FIXD BEARING SZ3 (Orthopedic Implant) ×1 IMPLANT
TOWER CARTRIDGE SMART MIX (DISPOSABLE) ×1 IMPLANT
TRAY FOLEY MTR SLVR 16FR STAT (SET/KITS/TRAYS/PACK) ×1 IMPLANT
TUBE KAMVAC SUCTION (TUBING) IMPLANT
TUBE SUCTION HIGH CAP CLEAR NV (SUCTIONS) ×1 IMPLANT
WATER STERILE IRR 1000ML POUR (IV SOLUTION) ×1 IMPLANT
WRAP KNEE MAXI GEL POST OP (GAUZE/BANDAGES/DRESSINGS) IMPLANT

## 2022-01-19 NOTE — Discharge Instructions (Signed)
Gaynelle Arabian, MD Total Joint Specialist [facility] [address], [address] [place], [place] [address] [phone]  TOTAL KNEE REVISION POSTOPERATIVE DIRECTIONS  Knee Rehabilitation, Guidelines Following Surgery  Results after knee surgery are often greatly improved when you follow the exercise, range of motion and muscle strengthening exercises prescribed by your doctor. Safety measures are also important to protect the knee from further injury. If any of these exercises cause you to have increased pain or swelling in your knee joint, decrease the amount until you are comfortable again and slowly increase them. If you have problems or questions, call your caregiver or physical therapist for advice.   BLOOD CLOT PREVENTION Take a 325 mg Aspirin two times a day for three weeks following surgery. Then resume one 81 mg Aspirin once a day. You may resume your vitamins/supplements upon discharge from the hospital. Do not take any NSAIDs (Advil, Aleve, Ibuprofen, Meloxicam, etc.) until you have discontinued the 325 mg Aspirin.  HOME CARE INSTRUCTIONS  Remove items at home which could result in a fall. This includes throw rugs or furniture in walking pathways.  ICE to the affected knee as much as tolerated. Icing helps control swelling. If the swelling is well controlled you will be more comfortable and rehab easier. Continue to use ice on the knee for pain and swelling from surgery. You may notice swelling that will progress down to the foot and ankle. This is normal after surgery. Elevate the leg when you are not up walking on it.    Continue to use the breathing machine which will help keep your temperature down. It is common for your temperature to cycle up and down following surgery, especially at night when you are not up moving around and exerting yourself. The breathing machine keeps your lungs expanded and your temperature down. Do not place pillow under the operative  knee, focus on keeping the knee straight while resting  DIET You may resume your previous home diet once you are discharged from the hospital.  DRESSING / WOUND CARE / SHOWERING Keep your bulky bandage on for 2 days. On the third post-operative day you may remove the Ace bandage and gauze. There is a waterproof adhesive bandage on your skin which will stay in place until your first follow-up appointment. Once you remove this you will not need to place another bandage You may begin showering 3 days following surgery, but do not submerge the incision under water.  ACTIVITY For the first 5 days, the key is rest and control of pain and swelling Do your home exercises twice a day starting on post-operative day 3. On the days you go to physical therapy, just do the home exercises once that day. You should rest, ice and elevate the leg for 50 minutes out of every hour. Get up and walk/stretch for 10 minutes per hour. After 5 days you can increase your activity slowly as tolerated. Walk with your walker as instructed. Use the walker until you are comfortable transitioning to a cane. Walk with the cane in the opposite hand of the operative leg. You may discontinue the cane once you are comfortable and walking steadily. Avoid periods of inactivity such as sitting longer than an hour when not asleep. This helps prevent blood clots.  You may discontinue the knee immobilizer once you are able to perform a straight leg raise while lying down. You may resume a sexual relationship in one month or when given the OK by your doctor.  You may return to work once you are cleared by your doctor.  Do not drive a car for 6 weeks or until released by your surgeon.  Do not drive while taking narcotics.  TED HOSE STOCKINGS Wear the elastic stockings on both legs for three weeks following surgery during the day. You may remove them at night for sleeping.  WEIGHT BEARING Weight bearing as tolerated with assist device  (walker, cane, etc) as directed, use it as long as suggested by your surgeon or therapist, typically at least 4-6 weeks.  POSTOPERATIVE CONSTIPATION PROTOCOL Constipation - defined medically as fewer than three stools per week and severe constipation as less than one stool per week.  One of the most common issues patients have following surgery is constipation.  Even if you have a regular bowel pattern at home, your normal regimen is likely to be disrupted due to multiple reasons following surgery.  Combination of anesthesia, postoperative narcotics, change in appetite and fluid intake all can affect your bowels.  In order to avoid complications following surgery, here are some recommendations in order to help you during your recovery period.  Colace (docusate) - Pick up an over-the-counter form of Colace or another stool softener and take twice a day as long as you are requiring postoperative pain medications.  Take with a full glass of water daily.  If you experience loose stools or diarrhea, hold the colace until you stool forms back up. If your symptoms do not get better within 1 week or if they get worse, check with your doctor. Dulcolax (bisacodyl) - Pick up over-the-counter and take as directed by the product packaging as needed to assist with the movement of your bowels.  Take with a full glass of water.  Use this product as needed if not relieved by Colace only.  MiraLax (polyethylene glycol) - Pick up over-the-counter to have on hand. MiraLax is a solution that will increase the amount of water in your bowels to assist with bowel movements.  Take as directed and can mix with a glass of water, juice, soda, coffee, or tea. Take if you go more than two days without a movement. Do not use MiraLax more than once per day. Call your doctor if you are still constipated or irregular after using this medication for 7 days in a row.  If you continue to have problems with postoperative constipation, please  contact the office for further assistance and recommendations.  If you experience "the worst abdominal pain ever" or develop nausea or vomiting, please contact the office immediatly for further recommendations for treatment.  ITCHING If you experience itching with your medications, try taking only a single pain pill, or even half a pain pill at a time.  You can also use Benadryl over the counter for itching or also to help with sleep.   MEDICATIONS See your medication summary on the "After Visit Summary" that the nursing staff will review with you prior to discharge.  You may have some home medications which will be placed on hold until you complete the course of blood thinner medication.  It is important for you to complete the blood thinner medication as prescribed by your surgeon.  Continue your approved medications as instructed at time of discharge.  PRECAUTIONS If you experience chest pain or shortness of breath - call 911 immediately for transfer to the hospital emergency department.  If you develop a fever greater that 101 F, purulent drainage from wound, increased redness or drainage from  wound, foul odor from the wound/dressing, or calf pain - CONTACT YOUR SURGEON.                                                   FOLLOW-UP APPOINTMENTS Make sure you keep all of your appointments after your operation with your surgeon and caregivers. You should call the office at the above phone number and make an appointment for approximately two weeks after the date of your surgery or on the date instructed by your surgeon outlined in the "After Visit Summary".  RANGE OF MOTION AND STRENGTHENING EXERCISES  Rehabilitation of the knee is important following a knee injury or an operation. After just a few days of immobilization, the muscles of the thigh which control the knee become weakened and shrink (atrophy). Knee exercises are designed to build up the tone and strength of the thigh muscles and to  improve knee motion. Often times heat used for twenty to thirty minutes before working out will loosen up your tissues and help with improving the range of motion but do not use heat for the first two weeks following surgery. These exercises can be done on a training (exercise) mat, on the floor, on a table or on a bed. Use what ever works the best and is most comfortable for you Knee exercises include:  Leg Lifts - While your knee is still immobilized in a splint or cast, you can do straight leg raises. Lift the leg to 60 degrees, hold for 3 sec, and slowly lower the leg. Repeat 10-20 times 2-3 times daily. Perform this exercise against resistance later as your knee gets better.  Quad and Hamstring Sets - Tighten up the muscle on the front of the thigh (Quad) and hold for 5-10 sec. Repeat this 10-20 times hourly. Hamstring sets are done by pushing the foot backward against an object and holding for 5-10 sec. Repeat as with quad sets.  Leg Slides: Lying on your back, slowly slide your foot toward your buttocks, bending your knee up off the floor (only go as far as is comfortable). Then slowly slide your foot back down until your leg is flat on the floor again. Angel Wings: Lying on your back spread your legs to the side as far apart as you can without causing discomfort.  A rehabilitation program following serious knee injuries can speed recovery and prevent re-injury in the future due to weakened muscles. Contact your doctor or a physical therapist for more information on knee rehabilitation.   POST-OPERATIVE OPIOID TAPER INSTRUCTIONS: It is important to wean off of your opioid medication as soon as possible. If you do not need pain medication after your surgery it is ok to stop day one. Opioids include: Codeine, Hydrocodone(Norco, Vicodin), Oxycodone(Percocet, oxycontin) and hydromorphone amongst others.  Long term and even short term use of opiods can cause: Increased pain  response Dependence Constipation Depression Respiratory depression And more.  Withdrawal symptoms can include Flu like symptoms Nausea, vomiting And more Techniques to manage these symptoms Hydrate well Eat regular healthy meals Stay active Use relaxation techniques(deep breathing, meditating, yoga) Do Not substitute Alcohol to help with tapering If you have been on opioids for less than two weeks and do not have pain than it is ok to stop all together.  Plan to wean off of opioids This plan should start  within one week post op of your joint replacement. Maintain the same interval or time between taking each dose and first decrease the dose.  Cut the total daily intake of opioids by one tablet each day Next start to increase the time between doses. The last dose that should be eliminated is the evening dose.   IF YOU ARE TRANSFERRED TO A SKILLED REHAB FACILITY If the patient is transferred to a skilled rehab facility following release from the hospital, a list of the current medications will be sent to the facility for the patient to continue.  When discharged from the skilled rehab facility, please have the facility set up the patient's [place] prior to being released. Also, the skilled facility will be responsible for providing the patient with their medications at time of release from the facility to include their pain medication, the muscle relaxants, and their blood thinner medication. If the patient is still at the rehab facility at time of the two week follow up appointment, the skilled rehab facility will also need to assist the patient in arranging follow up appointment in our office and any transportation needs.  MAKE SURE YOU:  Understand these instructions.  Get help right away if you are not doing well or get worse.   DENTAL ANTIBIOTICS:  In most cases prophylactic antibiotics for Dental procdeures after total joint surgery are not  necessary.  Exceptions are as follows:  1. History of prior total joint infection  2. Severely immunocompromised (Organ Transplant, cancer chemotherapy, Rheumatoid biologic meds such as [place])  3. Poorly controlled diabetes (A1C &gt; 8.0, blood glucose over 200)  If you have one of these conditions, contact your surgeon for an antibiotic prescription, prior to your dental procedure.    Pick up stool softner and laxative for home use following surgery while on pain medications. Do not submerge incision under water. Please use good hand washing techniques while changing dressing each day. May shower starting three days after surgery. Please use a clean towel to pat the incision dry following showers. Continue to use ice for pain and swelling after surgery. Do not use any lotions or creams on the incision until instructed by your surgeon.

## 2022-01-19 NOTE — Progress Notes (Signed)
Orthopedic Tech Progress Note Patient Details:  Erin Lopez 03/20/1951 [id number]  Patient ID: Erin Lopez, female   DOB: 1950/04/07, 71 y.o.   MRN: [id number]  [name] 01/19/2022, 1:04 PM Cpm applied in pacu

## 2022-01-19 NOTE — Interval H&P Note (Signed)
History and Physical Interval Note:  01/19/2022 9:55 AM  Erin Lopez  has presented today for surgery, with the diagnosis of Failed right total knee arthroplasty.  The various methods of treatment have been discussed with the patient and family. After consideration of risks, benefits and other options for treatment, the patient has consented to  Procedure(s): Right knee polyethylene vs total knee arthroplasty revision (Right) as a surgical intervention.  The patient's history has been reviewed, patient examined, no change in status, stable for surgery.  I have reviewed the patient's chart and labs.  Questions were answered to the patient's satisfaction.     Pilar Plate Berneda Piccininni

## 2022-01-19 NOTE — Anesthesia Procedure Notes (Signed)
Date/Time: 01/19/2022 11:06 AM  Performed by: Cynda Familia, CRNAPre-anesthesia Checklist: Patient identified, Emergency Drugs available, Suction available, Patient being monitored and Timeout performed Patient Re-evaluated:Patient Re-evaluated prior to induction Oxygen Delivery Method: Simple face mask Placement Confirmation: positive ETCO2 and breath sounds checked- equal and bilateral Dental Injury: Teeth and Oropharynx as per pre-operative assessment

## 2022-01-19 NOTE — Transfer of Care (Signed)
Immediate Anesthesia Transfer of Care Note  Patient: Erin Lopez  Procedure(s) Performed: Right knee polyethylene revision (Right: Knee)  Patient Location: PACU  Anesthesia Type:Spinal  Level of Consciousness: sedated  Airway & Oxygen Therapy: Patient Spontanous Breathing and Patient connected to face mask oxygen  Post-op Assessment: Report given to RN and Post -op Vital signs reviewed and stable  Post vital signs: Reviewed and stable  Last Vitals:  Vitals Value Taken Time  BP 113/69 01/19/22 1233  Temp    Pulse 62 01/19/22 1234  Resp 12 01/19/22 1234  SpO2 100 % 01/19/22 1234  Vitals shown include unvalidated device data.  Last Pain:  Vitals:   01/19/22 1030  TempSrc:   PainSc: 6       Patients Stated Pain Goal: 5 (56/38/75 6433)  Complications: No notable events documented.

## 2022-01-19 NOTE — Op Note (Unsigned)
NAME: Erin Lopez, LAHMAN MEDICAL RECORD NO: [id number] ACCOUNT NO: [id number] DATE OF BIRTH: 1950-08-27 FACILITY: [facility] LOCATION: [facility] PHYSICIAN: Dione Plover. Lindley Stachnik, MD  Operative Report   DATE OF PROCEDURE: 01/19/2022  PREOPERATIVE DIAGNOSIS:  Failed right total knee arthroplasty.  POSTOPERATIVE DIAGNOSIS:  Failed right total knee arthroplasty.  PROCEDURE:  Right knee polyethylene revision.  SURGEON:  Dione Plover. Ashland Osmer, MD  ASSISTANT:  Jaynie Bream, PA-C.  ANESTHESIA:  Adductor canal block and spinal.  ESTIMATED BLOOD LOSS:  25 mL.  DRAINS:  None.  COMPLICATIONS:  None.  TOURNIQUET TIME:  21 minutes at 300 mmHg.  CONDITION:  Stable to recovery.  BRIEF CLINICAL NOTE:  The patient is a 71 year old female status post right total knee arthroplasty in 2003.  She presented with an unstable right total knee with pain.  She had a bone scan, which was negative for any loosening.  Infection workup was  also negative.  It was felt that she had polyethylene wear leading to synovitis and swelling and also had the instability.  She presents today for polyethylene versus total knee revision.  DESCRIPTION OF PROCEDURE:  After successful administration of adductor canal block and spinal anesthetic, a tourniquet was placed high on her right thigh.  Exam under anesthesia was performed.  She has gross varus, valgus laxity in full extension and  gross AP laxity in 90 degrees of flexion.  Right lower extremity was then prepped and draped in the usual sterile fashion.  Extremity was wrapped in Esmarch and tourniquet inflated to 300 mmHg.  Midline incision was made with a 10 blade through  subcutaneous tissue to the level of the extensor mechanism.  A fresh blade was used to make a medial parapatellar arthrotomy.  Soft tissue on the proximal medial tibia subperiosteally elevated to the joint line with a knife and into the semimembranosus  bursa with a Cobb elevator.  Soft tissue laterally was  elevated with attention being paid to avoiding the patellar tendon on the tibial tubercle.  Note that there was minimal if any fluid encountered upon entering the joint.  There was a fair amount of wear debris consistent with polyethylene wear debris and the tissue reaction to that present in the joint.  We did a thorough synovectomy to remove  this wear debris.  I then flexed the knee to 90 degrees and used an osteotome to remove the tibial polyethylene from the tibial tray.  It was a size 3 Sigma fixed bearing tibial tray.  The polyethylene did not have any gross abnormalities or gross wear.   The undersurface of the polyethylene also looked fine.  I cleared all soft tissue from around the junction of the tibial component and bone and no evidence of any gaps.  I used an osteotome to press around the edges of the component and this component  was very stable.  We did a similar thing with the femoral side, the femoral component was also extremely stable.  The thickness of the removed, polyethylene was 10 mm.  We started doing trials, had a 12.5 mm. With a 12.5 there still a small bit of varus, valgus laxity.   Similarly with the 15, there is still some hyperextension.  I trialed the 17.5 mm thickness insert, which allowed for full extension with excellent varus, valgus, and anterior, posterior stability throughout full range of motion.  The tibial tray was  cleared of any soft tissue in the knee, permanent 17.5 mm fixed bearing posterior stabilized Sigma insert was impacted into  the tibial tray.  The knee was reduced with outstanding stability throughout full range of motion.  I inspected the patella with  no evidence of any abnormal wear of the patella.  There was a small spur overhanging laterally and I removed that, so as to prevent any articulation between the bone and the trochlea of the implant.  Patella tracks normally.  20 mL of Exparel mixed with  60 mL of saline was injected into the extensor  mechanism, periosteum of the femur and subcutaneous tissues.  The wound was copiously irrigated with saline solution with pulsatile lavage.  The extensor mechanism was closed with a running 0 PDS suture.   Flexion against gravity was 135 degrees and the patella tracks normally. A tourniquet was then released for a total time of 21 minutes.  There was minor bleeding stopped with cautery.  Subcutaneous is closed with interrupted 2-0 Vicryl and subcuticular  running 4-0 Monocryl.  Incisions cleaned and dried and Steri-Strips and a sterile dressing applied.  She was then placed into a knee immobilizer, awakened, and transported to recovery in stable condition.  Note that a surgical assistant was of medical necessity for this procedure to do it in a safe and expeditious manner.  Surgical assistant was necessary for retraction of vital ligaments and neurovascular structures and for proper positioning and limb for  safe removal of the old implant and safe and accurate placement of the new implant.   PUS D: 01/19/2022 12:14:54 pm T: 01/19/2022 1:38:00 pm  JOB: [id number]/ [id number]

## 2022-01-19 NOTE — Anesthesia Postprocedure Evaluation (Signed)
Anesthesia Post Note  Patient: Erin ANDRZEJEWSKI  Procedure(s) Performed: Right knee polyethylene revision (Right: Knee)     Patient location during evaluation: PACU Anesthesia Type: Regional, MAC and Spinal Level of consciousness: awake and alert Pain management: pain level controlled Vital Signs Assessment: post-procedure vital signs reviewed and stable Respiratory status: spontaneous breathing, nonlabored ventilation, respiratory function stable and patient connected to nasal cannula oxygen Cardiovascular status: stable and blood pressure returned to baseline Postop Assessment: no apparent nausea or vomiting Anesthetic complications: no   No notable events documented.  Last Vitals:  Vitals:   01/19/22 1509 01/19/22 1700  BP: 134/79 121/79  Pulse: 66 75  Resp: 18 18  Temp: 36.6 C 36.9 C  SpO2: 94% 95%    Last Pain:  Vitals:   01/19/22 1700  TempSrc: Oral  PainSc:                  March Rummage Ila Landowski

## 2022-01-19 NOTE — Evaluation (Signed)
Physical Therapy Evaluation Patient Details Name: Erin Lopez MRN: [id number] DOB: May 11, 1950 Today's Date: 01/19/2022  History of Present Illness  71 yo female s/p R TK revision 01/19/22. Hx of R TKA 2003, NSTEMI, TIA, bipolar d/o, COPD, chronic pain  Clinical Impression  On eval POD 0, pt was Min guard A for mobility. She walked ~20 feet with her standard walker. Assisted pt back to bed at her request. Pain rated 9/10 during session-made RN aware pt was requesting pain meds. O2 was 87% on RA during session-no [place] present in patient's room-applied [place] and placed on 2L-made RN aware-O2 sats increased to 95%. Pt is undecided about whether she wants a rolling walker-wants to think about it. She is also inquiring about the ice cooler ice pack for her knee-recommended she ask MD/PA if they have them available at [facility]. She also inquired about getting more time in [facility]-spoke with ortho tech who stated he would have a discussion with her about it. Will plan to follow and progress activity as tolerated.     Recommendations for follow up therapy are one component of a multi-disciplinary discharge planning process, led by the attending physician.  Recommendations may be updated based on patient status, additional functional criteria and insurance authorization.  Follow Up Recommendations Follow physician's recommendations for discharge plan and follow up therapies      Assistance Recommended at Discharge Frequent or constant Supervision/Assistance  Patient can return home with the following  A little help with walking and/or transfers;A little help with bathing/dressing/bathroom;Help with stairs or ramp for entrance;Assistance with cooking/housework;Assist for transportation    Equipment Recommendations Rolling walker (2 wheels) (if pt decides she wants one (she currently has a walker without any wheels)  Recommendations for Other Services       Functional Status Assessment Patient has had a  recent decline in their functional status and demonstrates the ability to make significant improvements in function in a reasonable and predictable amount of time.     Precautions / Restrictions Precautions Precautions: Fall;Knee Restrictions Weight Bearing Restrictions: No Other Position/Activity Restrictions: WBAT      Mobility  Bed Mobility Overal bed mobility: Needs Assistance Bed Mobility: Supine to Sit, Sit to Supine     Supine to sit: Supervision, HOB elevated Sit to supine: Supervision, HOB elevated   General bed mobility comments: Supv for safety, lines.    Transfers Overall transfer level: Needs assistance Equipment used: Standard walker Transfers: Sit to/from Stand Sit to Stand: Min guard, From elevated surface           General transfer comment: Min guard for safety. Cues for safety, hand/LE placement    Ambulation/Gait Ambulation/Gait assistance: Min guard Gait Distance (Feet): 20 Feet Assistive device: Standard walker Gait Pattern/deviations: Step-to pattern       General Gait Details: Pt preferred to use her personal standard walker. Cues for safety, sequence, RW proximity and proper operation. Pt denied dizziness. Tolerated distance well.  Stairs            Wheelchair Mobility    Modified Rankin (Stroke Patients Only)       Balance Overall balance assessment: Needs assistance         Standing balance support: Bilateral upper extremity supported, During functional activity, Reliant on assistive device for balance Standing balance-Leahy Scale: Poor                               Pertinent Vitals/Pain  Pain Assessment Pain Assessment: 0-10 Pain Score: 9  Pain Descriptors / Indicators: Discomfort, Sore, Aching Pain Intervention(s): Limited activity within patient's tolerance, Monitored during session, Repositioned, Ice applied, Patient requesting pain meds-RN notified    Home Living Family/patient expects to be  discharged to:: Private residence Living Arrangements: Alone Available Help at Discharge: Family;Available PRN/intermittently Type of Home: House Home Access: Stairs to enter Entrance Stairs-Rails: None Entrance Stairs-Number of Steps: 1   Home Layout: One level Home Equipment: Standard Walker;BSC/3in1;Shower seat      Prior Function Prior Level of Function : Independent/Modified Independent                     Hand Dominance        Extremity/Trunk Assessment   Upper Extremity Assessment Upper Extremity Assessment: Overall WFL for tasks assessed    Lower Extremity Assessment Lower Extremity Assessment: Generalized weakness    Cervical / Trunk Assessment Cervical / Trunk Assessment: Normal  Communication      Cognition Arousal/Alertness: Awake/alert Behavior During Therapy: WFL for tasks assessed/performed Overall Cognitive Status: Within Functional Limits for tasks assessed                                          General Comments      Exercises     Assessment/Plan    PT Assessment Patient needs continued PT services  PT Problem List Decreased strength;Decreased mobility;Decreased range of motion;Decreased activity tolerance;Decreased balance;Decreased knowledge of use of DME;Pain;Decreased knowledge of precautions       PT Treatment Interventions DME instruction;Gait training;Therapeutic activities;Therapeutic exercise;Patient/family education;Balance training;Stair training;Functional mobility training    PT Goals (Current goals can be found in the Care Plan section)  Acute Rehab PT Goals Patient Stated Goal: home. regain independence PT Goal Formulation: With patient Time For Goal Achievement: 02/02/22 Potential to Achieve Goals: Good    Frequency 7X/week     Co-evaluation               AM-PAC PT "6 Clicks" Mobility  Outcome Measure Help needed turning from your back to your side while in a flat bed without using  bedrails?: A Little Help needed moving from lying on your back to sitting on the side of a flat bed without using bedrails?: A Little Help needed moving to and from a bed to a chair (including a wheelchair)?: A Little Help needed standing up from a chair using your arms (e.g., wheelchair or bedside chair)?: A Little Help needed to walk in hospital room?: A Little Help needed climbing 3-5 steps with a railing? : A Little 6 Click Score: 18    End of Session Equipment Utilized During Treatment: Gait belt Activity Tolerance: Patient tolerated treatment well Patient left: in bed;with call bell/phone within reach;with bed alarm set   PT Visit Diagnosis: Pain;Difficulty in walking, not elsewhere classified (R26.2) Pain - Right/Left: Right Pain - part of body: Knee    Time: 0865-7846 PT Time Calculation (min) (ACUTE ONLY): 34 min   Charges:   PT Evaluation $PT Eval Low Complexity: 1 Low PT Treatments $Gait Training: 8-22 mins          Doreatha Massed, PT Acute Rehabilitation  Office: [phone] Pager: [phone]

## 2022-01-19 NOTE — Anesthesia Procedure Notes (Signed)
Anesthesia Regional Block: Adductor canal block   Pre-Anesthetic Checklist: , timeout performed,  Correct Patient, Correct Site, Correct Laterality,  Correct Procedure, Correct Position, site marked,  Risks and benefits discussed,  Surgical consent,  Pre-op evaluation,  At surgeon's request and post-op pain management  Laterality: Right  Prep: Dura Prep       Needles:  Injection technique: Single-shot  Needle Type: Echogenic Stimulator Needle     Needle Length: 10cm  Needle Gauge: 20     Additional Needles:   Procedures:,,,, ultrasound used (permanent image in chart),,    Narrative:  Start time: 01/19/2022 10:04 AM End time: 01/19/2022 10:08 AM Injection made incrementally with aspirations every 5 mL.  Performed by: Personally  Anesthesiologist: Darral Dash, DO  Additional Notes: Patient identified. Risks/Benefits/Options discussed with patient including but not limited to bleeding, infection, nerve damage, failed block, incomplete pain control. Patient expressed understanding and wished to proceed. All questions were answered. Sterile technique was used throughout the entire procedure. Please see nursing notes for vital signs. Aspirated in 5cc intervals with injection for negative confirmation. Patient was given instructions on fall risk and not to get out of bed. All questions and concerns addressed with instructions to call with any issues or inadequate analgesia.

## 2022-01-19 NOTE — Brief Op Note (Signed)
01/19/2022  12:07 PM  PATIENT:  Erin Lopez  71 y.o. female  PRE-OPERATIVE DIAGNOSIS:  Failed right total knee arthroplasty  POST-OPERATIVE DIAGNOSIS:  Failed right total knee arthroplasty  PROCEDURE:  Procedure(s): Right knee polyethylene revision (Right)  SURGEON:  Surgeon(s) and Role:    Gaynelle Arabian, MD - Primary  PHYSICIAN ASSISTANT:   ASSISTANTS: Jaynie Bream, PA-C   ANESTHESIA:    Adductor canal block  EBL:  25 mL   BLOOD ADMINISTERED:none  DRAINS: none   LOCAL MEDICATIONS USED:  OTHER Exparel  SPECIMEN:  No Specimen  COUNTS:  YES  TOURNIQUET:  21 minutes @ 300 mm Hg  DICTATION: .Other Dictation: Dictation Number [id number]  PLAN OF CARE: Admit to inpatient   PATIENT DISPOSITION:  PACU - hemodynamically stable.

## 2022-01-19 NOTE — Anesthesia Preprocedure Evaluation (Addendum)
Anesthesia Evaluation  Patient identified by MRN, date of birth, ID band Patient awake    Reviewed: Allergy & Precautions, NPO status , Patient's Chart, lab work & pertinent test results  Airway Mallampati: II  TM Distance: >3 FB Neck ROM: Full    Dental no notable dental hx.    Pulmonary COPD, former smoker,    Pulmonary exam normal        Cardiovascular hypertension, Pt. on medications and Pt. 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 The left ventricular ejection fraction is hyperdynamic (>65%). ? Nuclear stress EF: 66%. ? There was no ST segment deviation noted during stress. ? The study is normal. ? This is a low risk study.  Normal resting and stress perfusion. No ischemia or infarction EF 66%  12/03/2019 1. Left ventricular ejection fraction, by estimation, is 55 to 60%. The  left ventricle has normal function. Left ventricular endocardial border  not optimally defined to evaluate regional wall motion. There is mild left  ventricular hypertrophy. Left  ventricular diastolic parameters are indeterminate.  2. Right ventricular systolic function is normal. The right ventricular  size is normal. There is mildly elevated pulmonary artery systolic  pressure. The estimated right ventricular systolic pressure is 93.8 mmHg.  3. The mitral valve is normal in structure. Trivial mitral valve  regurgitation. No evidence of mitral stenosis.  4. The aortic valve is tricuspid. Aortic valve regurgitation is not  visualized. No aortic stenosis is present.  5. The inferior vena cava is normal in size with greater than 50%  respiratory variability, suggesting right atrial pressure of 3 mmHg.    Neuro/Psych Anxiety Depression Bipolar Disorder TIACVA    GI/Hepatic Neg liver ROS, hiatal hernia, GERD  Medicated,  Endo/Other  negative endocrine ROS   Renal/GU      Musculoskeletal  (+) Arthritis , Osteoarthritis,  Fibromyalgia -  Abdominal Normal abdominal exam  (+)   Peds  Hematology  (+) Blood dyscrasia, anemia ,   Anesthesia Other Findings   Reproductive/Obstetrics                             Anesthesia Physical Anesthesia Plan  ASA: 3  Anesthesia Plan: MAC, Regional and Spinal   Post-op Pain Management: Regional block*   Induction: Intravenous  PONV Risk Score and Plan: 2 and Ondansetron, Dexamethasone, Midazolam, Treatment may vary due to age or medical condition and Propofol infusion  Airway Management Planned: Simple Face Mask, Natural Airway and Nasal Cannula  Additional Equipment: None  Intra-op Plan:   Post-operative Plan:   Informed Consent: I have reviewed the patients History and Physical, chart, labs and discussed the procedure including the risks, benefits and alternatives for the proposed anesthesia with the patient or authorized representative who has indicated his/her understanding and acceptance.     Dental advisory given  Plan Discussed with: CRNA  Anesthesia Plan Comments: (Lab Results      Component                Value               Date                      WBC                      7.5  01/06/2022                HGB                      11.4 (L)            01/06/2022                HCT                      34.0 (L)            01/06/2022                MCV                      92.4                01/06/2022                PLT                      239                 01/06/2022           Lab Results      Component                Value               Date                      NA                       136                 01/06/2022                K                        4.5                 01/06/2022                CO2                      28                  01/06/2022                GLUCOSE                  88                  01/06/2022                 BUN                      17                  01/06/2022                CREATININE               0.96                01/06/2022  CALCIUM                  9.5                 01/06/2022                GFRNONAA                 >60                 01/06/2022          )        Anesthesia Quick Evaluation

## 2022-01-19 NOTE — Anesthesia Procedure Notes (Signed)
Spinal  Patient location during procedure: OR Start time: 01/19/2022 11:09 AM End time: 01/19/2022 11:11 AM Staffing Performed: anesthesiologist  Anesthesiologist: Darral Dash, DO Performed by: Darral Dash, DO Authorized by: Darral Dash, DO   Preanesthetic Checklist Completed: patient identified, IV checked, site marked, risks and benefits discussed, surgical consent, monitors and equipment checked, pre-op evaluation and timeout performed Spinal Block Patient position: sitting Prep: DuraPrep Patient monitoring: heart rate, cardiac monitor, continuous pulse ox and blood pressure Approach: midline Location: L4-5 Injection technique: single-shot Needle Needle type: Pencan  Needle gauge: 24 G Needle length: 10 cm Assessment Events: CSF return Additional Notes Patient identified. Risks/Benefits/Options discussed with patient including but not limited to bleeding, infection, nerve damage, paralysis, failed block, incomplete pain control, headache, blood pressure changes, nausea, vomiting, reactions to medications, itching and postpartum back pain. Confirmed with bedside nurse the patient's most recent platelet count. Confirmed with patient that they are not currently taking any anticoagulation, have any bleeding history or any family history of bleeding disorders. Patient expressed understanding and wished to proceed. All questions were answered. Sterile technique was used throughout the entire procedure. Please see nursing notes for vital signs. Warning signs of high block given to the patient including shortness of breath, tingling/numbness in hands, complete motor block, or any concerning symptoms with instructions to call for help. Patient was given instructions on fall risk and not to get out of bed. All questions and concerns addressed with instructions to call with any issues or inadequate analgesia.

## 2022-01-20 LAB — CBC
HCT: 28.7 % — ABNORMAL LOW (ref 36.0–46.0)
Hemoglobin: 9.7 g/dL — ABNORMAL LOW (ref 12.0–15.0)
MCH: 31.5 pg (ref 26.0–34.0)
MCHC: 33.8 g/dL (ref 30.0–36.0)
MCV: 93.2 fL (ref 80.0–100.0)
Platelets: 221 10*3/uL (ref 150–400)
RBC: 3.08 MIL/uL — ABNORMAL LOW (ref 3.87–5.11)
RDW: 12.9 % (ref 11.5–15.5)
WBC: 8.3 10*3/uL (ref 4.0–10.5)
nRBC: 0 % (ref 0.0–0.2)

## 2022-01-20 LAB — BASIC METABOLIC PANEL
Anion gap: 5 (ref 5–15)
BUN: 10 mg/dL (ref 8–23)
CO2: 26 mmol/L (ref 22–32)
Calcium: 8.7 mg/dL — ABNORMAL LOW (ref 8.9–10.3)
Chloride: 104 mmol/L (ref 98–111)
Creatinine, Ser: 0.69 mg/dL (ref 0.44–1.00)
GFR, Estimated: >60 mL/min (ref >60)
Glucose, Bld: 133 mg/dL — ABNORMAL HIGH (ref 70–99)
Potassium: 4.2 mmol/L (ref 3.5–5.1)
Sodium: 135 mmol/L (ref 135–145)

## 2022-01-20 MED ORDER — METHOCARBAMOL 500 MG PO TABS: 500.0000 mg | ORAL_TABLET | Freq: Four times a day (QID) | ORAL | 0 refills | Status: DC | PRN

## 2022-01-20 MED ORDER — LORATADINE 10 MG PO TABS
10.0000 mg | ORAL_TABLET | Freq: Every day | ORAL | Status: DC
  Administered 2022-01-20: 10 mg via ORAL
  Filled 2022-01-20: qty 1

## 2022-01-20 MED ORDER — OXYCODONE HCL 5 MG PO TABS: 5.0000 mg | ORAL_TABLET | Freq: Four times a day (QID) | ORAL | 0 refills | Status: DC | PRN

## 2022-01-20 MED ORDER — ASPIRIN 325 MG PO TBEC: 325.0000 mg | DELAYED_RELEASE_TABLET | Freq: Two times a day (BID) | ORAL | 0 refills | Status: AC

## 2022-01-20 NOTE — Progress Notes (Signed)
Physical Therapy Treatment Patient Details Name: Erin Lopez MRN: [id number] DOB: 06/06/1950 Today's Date: 01/20/2022   History of Present Illness 71 yo female s/p R TK revision 01/19/22. Hx of R TKA 2003, NSTEMI, TIA, bipolar d/o, COPD, chronic pain    PT Comments    Patient making good progress with mobility and ambulated ~200' with RW, progressing from min guard to supervision level. Pt complete curb negotiation with RW and no LOB noted, safe sequencing with cues from PT and pt verbalized understanding for assist from family/friends. Pt required cues for safety with toilet transfer and min guard for safety. EOS pt noted to have dropped to 88% on RA with activity. 1L/min replaced and SpO2 returned to 94%. Will continue to progress as able in acute setting.    Recommendations for follow up therapy are one component of a multi-disciplinary discharge planning process, led by the attending physician.  Recommendations may be updated based on patient status, additional functional criteria and insurance authorization.  Follow Up Recommendations  Follow physician's recommendations for discharge plan and follow up therapies     Assistance Recommended at Discharge Frequent or constant Supervision/Assistance  Patient can return home with the following A little help with walking and/or transfers;A little help with bathing/dressing/bathroom;Help with stairs or ramp for entrance;Assistance with cooking/housework;Assist for transportation   Equipment Recommendations  Rolling walker (2 wheels)    Recommendations for Other Services       Precautions / Restrictions Precautions Precautions: Fall;Knee Restrictions Weight Bearing Restrictions: No Other Position/Activity Restrictions: WBAT     Mobility  Bed Mobility Overal bed mobility: Needs Assistance Bed Mobility: Supine to Sit     Supine to sit: Supervision, HOB elevated     General bed mobility comments: pt able to bring Rt LE  off EOB without belt or assist. supervision for safety with pt taking extra time.    Transfers Overall transfer level: Needs assistance Equipment used: Standard walker Transfers: Sit to/from Stand Sit to Stand: Min guard, Supervision           General transfer comment: cues for hand placement for power up from EOB, recliner, and toilet. pt requires grab bar in bathroom.    Ambulation/Gait Ambulation/Gait assistance: Min guard, Supervision Gait Distance (Feet): 200 Feet Assistive device: Rolling walker (2 wheels) Gait Pattern/deviations: Step-to pattern, Step-through pattern, Decreased stride length Gait velocity: decr     General Gait Details: cues for safe managment of RW, pt initially picking it up to advance. pt progressed to step through pattern while maintaining safe proximity inside RW.   Stairs Stairs: Yes Stairs assistance: Min guard Stair Management: No rails, Step to pattern, Forwards, With walker Number of Stairs: 1 General stair comments: cues for step sequencing with curb negotiation, no LOB noted, pt verbalized understanding for family/friends to assist   Wheelchair Mobility    Modified Rankin (Stroke Patients Only)       Balance Overall balance assessment: Needs assistance Sitting-balance support: Feet supported Sitting balance-Leahy Scale: Good     Standing balance support: Bilateral upper extremity supported, During functional activity, Reliant on assistive device for balance Standing balance-Leahy Scale: Poor                              Cognition Arousal/Alertness: Awake/alert Behavior During Therapy: WFL for tasks assessed/performed Overall Cognitive Status: Within Functional Limits for tasks assessed  Exercises Total Joint Exercises Ankle Circles/Pumps: AROM, 15 reps, Both Quad Sets: AROM, Right, 10 reps Heel Slides: AAROM, Right, 10 reps    General Comments         Pertinent Vitals/Pain Pain Assessment Pain Assessment: 0-10 Pain Score: 8  Pain Location: Rt knee Pain Descriptors / Indicators: Discomfort, Sore, Aching Pain Intervention(s): Limited activity within patient's tolerance, Monitored during session, Premedicated before session, Repositioned    Home Living                          Prior Function            PT Goals (current goals can now be found in the care plan section) Acute Rehab PT Goals Patient Stated Goal: home. regain independence PT Goal Formulation: With patient Time For Goal Achievement: 02/02/22 Potential to Achieve Goals: Good Progress towards PT goals: Progressing toward goals    Frequency    7X/week      PT Plan Current plan remains appropriate    Co-evaluation              AM-PAC PT "6 Clicks" Mobility   Outcome Measure  Help needed turning from your back to your side while in a flat bed without using bedrails?: A Little Help needed moving from lying on your back to sitting on the side of a flat bed without using bedrails?: A Little Help needed moving to and from a bed to a chair (including a wheelchair)?: A Little Help needed standing up from a chair using your arms (e.g., wheelchair or bedside chair)?: A Little Help needed to walk in hospital room?: A Little Help needed climbing 3-5 steps with a railing? : A Little 6 Click Score: 18    End of Session Equipment Utilized During Treatment: Gait belt Activity Tolerance: Patient tolerated treatment well Patient left: with call bell/phone within reach;with chair alarm set;in chair Nurse Communication: Mobility status PT Visit Diagnosis: Pain;Difficulty in walking, not elsewhere classified (R26.2) Pain - Right/Left: Right Pain - part of body: Knee     Time: 4742-5956 PT Time Calculation (min) (ACUTE ONLY): 44 min  Charges:  $Gait Training: 8-22 mins $Therapeutic Exercise: 8-22 mins $Therapeutic Activity: 8-22 mins                      Verner Mould, DPT Acute Rehabilitation Services Office [phone]  01/20/22 1:02 PM

## 2022-01-20 NOTE — TOC Transition Note (Signed)
Transition of Care [facility]) - CM/SW Discharge Note   Patient Details  Name: Erin Lopez MRN: [id number] Date of Birth: 07-18-1950  [facility] CM/SW Contact:  Lennart Pall, LCSW Phone Number: 01/20/2022, 12:00 PM   Clinical Narrative:     Met with pt and confirming she does need a RW; no DME agency preference - order placed with [facility] and delivered to room.  OPPT already arranged with [facility].  No further TOC needs.  Final next level of care: OP Rehab Barriers to Discharge: No Barriers Identified   Patient Goals and CMS Choice Patient states their goals for this hospitalization and ongoing recovery are:: return home      Discharge Placement                       Discharge Plan and Services                DME Arranged: Walker rolling DME Agency: [employer] Date DME Agency Contacted: 01/20/22 Time DME Agency Contacted: 85 Representative spoke with at DME Agency: Andee Poles            Social Determinants of Health ([place]) Interventions Food Insecurity Interventions: Intervention Not Indicated Housing Interventions: Intervention Not Indicated Transportation Interventions: Intervention Not Indicated Utilities Interventions: Intervention Not Indicated   Readmission Risk Interventions    01/20/2022   11:59 AM  Readmission Risk Prevention Plan  Transportation Screening Complete  PCP or Specialist Appt within 5-7 Days Complete  Home Care Screening Complete  Medication Review (RN CM) Complete

## 2022-01-20 NOTE — Progress Notes (Signed)
Subjective: 1 Day Post-Op Procedure(s) (LRB): Right knee polyethylene revision (Right) Patient reports pain as moderate.   Patient seen in rounds by Dr. Wynelle Link. Patient is doing well other than pain in the right knee. No issues overnight. Denies chest pain, SOB, or calf pain. Foley catheter removed this AM.  We will continue therapy today, ambulated 20' yesterday.   Objective: Vital signs in last 24 hours: Temp:  [97.5 F (36.4 C)-98.7 F (37.1 C)] 97.9 F (36.6 C) (10/19 0514) Pulse Rate:  [59-85] 81 (10/19 0514) Resp:  [11-18] 16 (10/19 0514) BP: (113-146)/(60-99) 140/77 (10/19 0514) SpO2:  [93 %-100 %] 99 % (10/19 0514) Weight:  [81.6 kg] 81.6 kg (10/18 0911)  Intake/Output from previous day:  Intake/Output Summary (Last 24 hours) at 01/20/2022 0747 Last data filed at 01/20/2022 9563 Gross per 24 hour  Intake 2200.97 ml  Output 2625 ml  Net -424.03 ml     Intake/Output this shift: No intake/output data recorded.  Labs: Recent Labs    01/20/22 0345  HGB 9.7*   Recent Labs    01/20/22 0345  WBC 8.3  RBC 3.08*  HCT 28.7*  PLT 221   Recent Labs    01/20/22 0345  NA 135  K 4.2  CL 104  CO2 26  BUN 10  CREATININE 0.69  GLUCOSE 133*  CALCIUM 8.7*   No results for input(s): "LABPT", "INR" in the last 72 hours.  Exam: General - Patient is Alert and Oriented Extremity - Neurologically intact Neurovascular intact Sensation intact distally Dorsiflexion/Plantar flexion intact Dressing - dressing C/D/I Motor Function - intact, moving foot and toes well on exam.   Past Medical History:  Diagnosis Date   Alcoholism in remission ([place])    states last alcohol 11 yrs ago   Anemia    Anxiety    Bipolar 1 disorder (HCC)    Admission for mania x 2 (most recent 11/2017)   Cancer ([place])    Cervical spondylosis without myelopathy 12/19/2013   MRI 02/2014: multilevel DDD/spondylosis, not much change compared to prior MRI.  Pt not in favor of invasive therapy  for her neck as of 04/2014.   Cholelithiasis    COPD (chronic obstructive pulmonary disease) (HCC)    Moderate: anoro started 04/2017 by pulm, pt symptomatically improved and PFTs stable at f/u 06/2017.   Coronary artery spasm ([place]) 07/11/2018   nitrates=HA. Amlodipine=intol swelling.  Changed to coreg 09/03/18 by cardiologist.   Depression    Dilutional hyponatremia    Endometritis 05/2017   possible; empiric tx with Flagyl by GYN.   Environmental allergies    Fibromyalgia    GERD (gastroesophageal reflux disease)    Hepatic steatosis 03/2019   u/s abd   Herpes zoster 08/07/2017   L scapular region   Hiatal hernia    History of adenomatous polyp of colon 04/2008; 08/2014   No high grade dysplasia: recall 5 yrs ([name], [name])   History of basal cell carcinoma excision    NOSE   History of Helicobacter pylori infection 04/2008   +gastric biopsy (gastritis but no metaplasia, dysplasia, or malignancy identified)   History of kidney stones    History of TIA (transient ischemic attack)    secondary to HELLP syndrome 1994 post c/s--  residual memory loss   Hyperlipidemia    statin started after her NSTEMI: goal  LDL <70.   Hypertension    Internal hemorrhoids    Ischemic cardiomyopathy 05/2018   Echo EF 45-50% in  context of NSTEMI from CA spasm. 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 04/2017.   Leg pain    ABIs 11/2019: normal    Memory loss    Abnl MRI brain and CT brain c/w chronic microvascular ischemia.  Repeat MRI brain 02/2014 stable (Dr. Mayra Reel with no plan to f/u with neuro as of 04/2014)   NSTEMI (non-ST elevated myocardial infarction) ([place]) 05/2018   Echo EF 45-50% in context of NSTEMI from CA spasm.// Myoview 8/21: EF  66, no ischemia, low risk   Osteoarthritis of left wrist    STT and 1st [place] jt   Osteoporosis 05/2010   2012 'penia; 06/2015 'porosis:  prolia.  DEXA 09/2017 T-score -2.2 femur neck. 11/2021 T score -2.2. Rpt 2 yrs   Pelvic floor dysfunction    [facility] ([name])   Pneumonia    Postmenopausal vaginal bleeding 05/2017   x 1 small episode; GYN attempted endo bx but unable to penetrate cervix due to severe cerv stenosis (due to postmenopausal state + hx of LEEP).  Endo u/s showed thin endo lining.  Per GYN---no evidence of endomet pathology on w/u---obs as of 07/2017.   Pre-diabetes    Prediabetes 06/2016   Fasting gluc 105; HbA1c at that time was 6.0%.  A1c 6.2% 12/2017.  A1c 5.7% Feb 2023.   Recurrent kidney stones 08/2013   Left ureteral calculus: Perc nephr--cystoscopy w/ureteroscopy + laser for stone removal.  Residual asymptomatic left renal nephrolithiasis <65m present post-procedure.  Right sided hydronephrosis-persistent (on u/s)--urol ordered CT to further eval 08/2016: 1.6 cm nonobstructing stone lower pole left kidney, no hydro, plan for PCN extraction ([facility])   Shortness of breath    Sprain of neck 12/19/2013   Stroke (HCC)    TIA   TMJ (temporomandibular joint disorder)    USES  MOUTH GUARD   Toe fracture, left 12/2017   2nd toe prox phalanx (immobilization, Dr. RZadie Rhine   Tubular adenoma of colon    Vitamin D deficiency 05/2011   Weak urinary stream 07/2016   with elevated PVR and mild left hydronephrosis secondary to this ([facility] started flomax 0.'4mg'$  qd for this).    Assessment/Plan: 1 Day Post-Op Procedure(s) (LRB): Right knee polyethylene revision (Right) Principal Problem:   Failed total knee arthroplasty (HCC) Active Problems:   Failed total right knee replacement (HCC)  Estimated body mass index is 29.05 kg/m as calculated from the following:   Height as of this encounter: '5\' 6"'$  (1.676 m).   Weight as of this encounter: 81.6 kg. Advance diet Up  with therapy D/C IV fluids    DVT Prophylaxis - Aspirin Weight bearing as tolerated. Continue therapy.  Plan is to go Home after hospital stay. Possible discharge later today if progresses with therapy and pain is well controlled Scheduled for OPPT Follow-up in the office in 2 weeks.  The PDMP database was reviewed today prior to any opioid medications being prescribed to this patient.  KTheresa Duty PA-C Orthopedic Surgery ([phone]/19/2023, 7:47 AM

## 2022-01-20 NOTE — Progress Notes (Addendum)
Physical Therapy Treatment Patient Details Name: Erin Lopez MRN: [id number] DOB: 1951/02/24 Today's Date: 01/20/2022   History of Present Illness 71 yo female s/p R TK revision 01/19/22. Hx of R TKA 2003, NSTEMI, TIA, bipolar d/o, COPD, chronic pain    PT Comments    Patient making steady progress with mobility and ambulated ~200' with RW, demonstrated good carryover from prior session for safe walker management with transfers and gait. EOS reviewed HEP for all seated and supine exercises. SpO2 90-94% on RA during mobility and 95% at EOS with rest. Pt returned to bed EOS to rest. She is mobilizing at safe level for discharge home with assist from family.    Recommendations for follow up therapy are one component of a multi-disciplinary discharge planning process, led by the attending physician.  Recommendations may be updated based on patient status, additional functional criteria and insurance authorization.  Follow Up Recommendations  Follow physician's recommendations for discharge plan and follow up therapies     Assistance Recommended at Discharge Frequent or constant Supervision/Assistance  Patient can return home with the following A little help with walking and/or transfers;A little help with bathing/dressing/bathroom;Help with stairs or ramp for entrance;Assistance with cooking/housework;Assist for transportation   Equipment Recommendations  Rolling walker (2 wheels)    Recommendations for Other Services       Precautions / Restrictions Precautions Precautions: Fall;Knee Restrictions Weight Bearing Restrictions: No Other Position/Activity Restrictions: WBAT     Mobility  Bed Mobility Overal bed mobility: Needs Assistance Bed Mobility: Sit to Supine     Supine to sit: Supervision, HOB elevated Sit to supine: Supervision   General bed mobility comments: pt OOB in bathroom at start. EOS supervision and pt taking extra time to return to supine.     Transfers Overall transfer level: Needs assistance Equipment used: Standard walker Transfers: Sit to/from Stand Sit to Stand: Min guard, Supervision           General transfer comment: good recall for hand placement with RW for power up. supervision for safety from recliner, guarding from toilet.    Ambulation/Gait Ambulation/Gait assistance: Supervision Gait Distance (Feet): 200 Feet Assistive device: Rolling walker (2 wheels) Gait Pattern/deviations: Step-to pattern, Step-through pattern, Decreased stride length Gait velocity: decr     General Gait Details: pt maintained safe position to RW, progressed to step through pattern. no buckling or LOB noted.   Stairs Stairs: Yes Stairs assistance: Min guard Stair Management: No rails, Step to pattern, Forwards, With walker Number of Stairs: 1 General stair comments: cues for step sequencing with curb negotiation, no LOB noted, pt verbalized understanding for family/friends to assist   Wheelchair Mobility    Modified Rankin (Stroke Patients Only)       Balance Overall balance assessment: Needs assistance Sitting-balance support: Feet supported Sitting balance-Leahy Scale: Good     Standing balance support: Bilateral upper extremity supported, During functional activity, Reliant on assistive device for balance Standing balance-Leahy Scale: Poor                              Cognition Arousal/Alertness: Awake/alert Behavior During Therapy: WFL for tasks assessed/performed Overall Cognitive Status: Within Functional Limits for tasks assessed                                          Exercises Total Joint  Exercises Ankle Circles/Pumps: AROM, 15 reps, Both Quad Sets: AROM, Right, 5 reps Short Arc Quad: AROM, Right, 5 reps Heel Slides: Right, 5 reps, AROM Hip ABduction/ADduction: AROM, Right, 5 reps Straight Leg Raises: AROM, Right, 5 reps Long Arc Quad: AROM, Right, 5 reps     General Comments        Pertinent Vitals/Pain Pain Assessment Pain Assessment: 0-10 Pain Score: 4  Pain Location: Rt knee Pain Descriptors / Indicators: Discomfort, Sore, Aching Pain Intervention(s): Limited activity within patient's tolerance, Monitored during session, Repositioned    Home Living                          Prior Function            PT Goals (current goals can now be found in the care plan section) Acute Rehab PT Goals Patient Stated Goal: home. regain independence PT Goal Formulation: With patient Time For Goal Achievement: 02/02/22 Potential to Achieve Goals: Good Progress towards PT goals: Progressing toward goals    Frequency    7X/week      PT Plan Current plan remains appropriate    Co-evaluation              AM-PAC PT "6 Clicks" Mobility   Outcome Measure  Help needed turning from your back to your side while in a flat bed without using bedrails?: A Little Help needed moving from lying on your back to sitting on the side of a flat bed without using bedrails?: A Little Help needed moving to and from a bed to a chair (including a wheelchair)?: A Little Help needed standing up from a chair using your arms (e.g., wheelchair or bedside chair)?: A Little Help needed to walk in hospital room?: A Little Help needed climbing 3-5 steps with a railing? : A Little 6 Click Score: 18    End of Session Equipment Utilized During Treatment: Gait belt Activity Tolerance: Patient tolerated treatment well Patient left: with call bell/phone within reach;with chair alarm set;in chair Nurse Communication: Mobility status PT Visit Diagnosis: Pain;Difficulty in walking, not elsewhere classified (R26.2) Pain - Right/Left: Right Pain - part of body: Knee     Time: 6644-0347 PT Time Calculation (min) (ACUTE ONLY): 25 min  Charges:  $Gait Training: 8-22 mins $Therapeutic Exercise: 8-22 mins                     Verner Mould, DPT Acute  Rehabilitation Services Office [phone]  01/20/22 1:57 PM

## 2022-01-21 ENCOUNTER — Encounter (HOSPITAL_COMMUNITY): Payer: Self-pay | Admitting: Orthopedic Surgery

## 2022-01-21 NOTE — Plan of Care (Signed)
Patient discharged home with daughter via private vehicle. Ivan Anchors, RN

## 2022-01-25 ENCOUNTER — Telehealth: Payer: Self-pay

## 2022-01-25 NOTE — Patient Outreach (Signed)
  Care Coordination TOC Note Transition Care Management Follow-up Telephone Call Date of discharge and from where: 01/20/22-[facility] How have you been since you were released from the hospital? Patient voices she is doing okay. She has had a lot of pain which is to be expected. She is taking pain meds as ordered and trying to stay on top of pain. Also, alternating ice pack to affected area. She voices some issues with constipation at times. Reviewed bowel regimen and ways to help. States she starts outpatient therapy tomorrow. Any questions or concerns? No  Items Reviewed: Did the pt receive and understand the discharge instructions provided? Yes  Medications obtained and verified? Yes  Other? Yes post op care/pain mgmt/bowel regimen Any new allergies since your discharge? No  Dietary orders reviewed? Yes Do you have support at home?  Limited support-family/friends working during the day  [place] and Equipment/Supplies: Were home health services ordered? no If so, what is the name of the agency? N/A  Has the agency set up a time to come to the patient's home? not applicable Were any new equipment or medical supplies ordered?  Yes: RW What is the name of the medical supply agency? [employer] Were you able to get the supplies/equipment? yes Do you have any questions related to the use of the equipment or supplies? No  Functional Questionnaire: (I = Independent and D = Dependent) ADLs: I  Bathing/Dressing- I  Meal Prep- I  Eating- I  Maintaining continence- I  Transferring/Ambulation- I  Managing Meds- I  Follow up appointments reviewed:  PCP Hospital f/u appt confirmed? No  Patient as not aware that she needed to follow up with PCP-not on d/c summary-discussed hospital recommendation for PCP follow up within 2wks of discharge. Patient will contact office. [facility] f/u appt confirmed? Yes  Scheduled to see Dr. Wynelle Link on 02/01/22. Are transportation  arrangements needed? No -Discussed with pt to check to see if she has transportation benefit through [facility]. She is relying on family/friends to take her to appts until she is able to drive again. If their condition worsens, is the pt aware to call PCP or go to the Emergency Dept.? Yes Was the patient provided with contact information for the PCP's office or ED? Yes Was to pt encouraged to call back with questions or concerns? Yes  SDOH assessments and interventions completed:   Yes  Care Coordination Interventions Activated:  Yes   Care Coordination Interventions:  Education provided.    Encounter Outcome:  Pt. Visit Completed    Enzo Montgomery, RN,BSN,CCM [place] [facility] Telephonic Care Management Coordinator Direct Phone: [phone] Toll Free: [phone] Fax: [phone]

## 2022-01-25 NOTE — Patient Outreach (Signed)
  Care Coordination TOC Note Transition Care Management Unsuccessful Follow-up Telephone Call  Date of discharge and from where:  01/20/22-[facility]   Attempts:  1st Attempt  Reason for unsuccessful TCM follow-up call:  No answer/busy   Enzo Montgomery, RN,BSN,CCM [place] [facility] Telephonic Care Management Coordinator Direct Phone: [phone] Toll Free: [phone] Fax: [phone]

## 2022-01-25 NOTE — Discharge Summary (Signed)
Patient ID: Erin Lopez MRN: [id number] DOB/AGE: 1950/05/03 71 y.o.  Admit date: 01/19/2022 Discharge date: 01/20/2022  Admission Diagnoses:  Principal Problem:   Failed total knee arthroplasty [facility]) Active Problems:   Failed total right knee replacement [facility])   Discharge Diagnoses:  Same  Past Medical History:  Diagnosis Date   Alcoholism in remission [facility])    states last alcohol 11 yrs ago   Anemia    Anxiety    Bipolar 1 disorder ([place])    Admission for mania x 2 (most recent 11/2017)   Cancer ([place])    Cervical spondylosis without myelopathy 12/19/2013   MRI 02/2014: multilevel DDD/spondylosis, not much change compared to prior MRI.  Pt not in favor of invasive therapy for her neck as of 04/2014.   Cholelithiasis    COPD (chronic obstructive pulmonary disease) (HCC)    Moderate: anoro started 04/2017 by pulm, pt symptomatically improved and PFTs stable at f/u 06/2017.   Coronary artery spasm ([place]) 07/11/2018   nitrates=HA. Amlodipine=intol swelling.  Changed to coreg 09/03/18 by cardiologist.   Depression    Dilutional hyponatremia    Endometritis 05/2017   possible; empiric tx with Flagyl by GYN.   Environmental allergies    Fibromyalgia    GERD (gastroesophageal reflux disease)    Hepatic steatosis 03/2019   u/s abd   Herpes zoster 08/07/2017   L scapular region   Hiatal hernia    History of adenomatous polyp of colon 04/2008; 08/2014   No high grade dysplasia: recall 5 yrs ([name], [name])   History of basal cell carcinoma excision    NOSE   History of Helicobacter pylori infection 04/2008   +gastric biopsy (gastritis but no metaplasia, dysplasia, or malignancy identified)   History of kidney stones    History of TIA (transient ischemic attack)    secondary to HELLP syndrome 1994 post c/s--  residual memory loss   Hyperlipidemia    statin started after her NSTEMI: goal  LDL <70.   Hypertension    Internal hemorrhoids    Ischemic cardiomyopathy  05/2018   Echo EF 45-50% in context of NSTEMI from CA spasm. 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 04/2017.   Leg pain    ABIs 11/2019: normal    Memory loss    Abnl MRI brain and CT brain c/w chronic microvascular ischemia.  Repeat MRI brain 02/2014 stable (Dr. Mayra Reel with no plan to f/u with neuro as of 04/2014)   NSTEMI (non-ST elevated myocardial infarction) ([place]) 05/2018   Echo EF 45-50% in context of NSTEMI from CA spasm.// Myoview 8/21: EF 66, no ischemia, low risk   Osteoarthritis of left wrist    STT and 1st [place] jt   Osteoporosis 05/2010   2012 'penia; 06/2015 'porosis:  prolia.  DEXA 09/2017 T-score -2.2 femur neck. 11/2021 T score -2.2. Rpt 2 yrs   Pelvic floor dysfunction    [facility] ([name])   Pneumonia    Postmenopausal vaginal bleeding 05/2017   x 1 small episode; GYN attempted endo bx but unable to penetrate cervix due to severe cerv stenosis (due to postmenopausal state + hx of LEEP).  Endo u/s showed thin endo lining.  Per GYN---no evidence of endomet pathology on w/u---obs as of 07/2017.  Pre-diabetes    Prediabetes 06/2016   Fasting gluc 105; HbA1c at that time was 6.0%.  A1c 6.2% 12/2017.  A1c 5.7% Feb 2023.   Recurrent kidney stones 08/2013   Left ureteral calculus: Perc nephr--cystoscopy w/ureteroscopy + laser for stone removal.  Residual asymptomatic left renal nephrolithiasis <39m present post-procedure.  Right sided hydronephrosis-persistent (on u/s)--urol ordered CT to further eval 08/2016: 1.6 cm nonobstructing stone lower pole left kidney, no hydro, plan for PCN extraction ([facility])   Shortness of breath    Sprain of neck 12/19/2013   Stroke (HCC)    TIA   TMJ (temporomandibular joint disorder)    USES   MOUTH GUARD   Toe fracture, left 12/2017   2nd toe prox phalanx (immobilization, Dr. RZadie Rhine   Tubular adenoma of colon    Vitamin D deficiency 05/2011   Weak urinary stream 07/2016   with elevated PVR and mild left hydronephrosis secondary to this ([facility] started flomax 0.'4mg'$  qd for this).    Surgeries: Procedure(s): Right knee polyethylene revision on 01/19/2022   Consultants:   Discharged Condition: Improved  Hospital Course: CKATY BRICKELLis an 71y.o. female who was admitted 01/19/2022 for operative treatment ofFailed [place]. Patient has severe unremitting pain that affects sleep, daily activities, and work/hobbies. After pre-op clearance the patient was taken to the operating room on 01/19/2022 and underwent  Procedure(s): Right knee polyethylene revision.    Patient was given perioperative antibiotics:  Anti-infectives (From admission, onward)    Start     Dose/Rate Route Frequency Ordered Stop   01/19/22 2200  vancomycin (VANCOCIN) IVPB 1000 mg/200 mL premix        1,000 mg 200 mL/hr over 60 Minutes Intravenous Every 12 hours 01/19/22 1507 01/20/22 0701   01/19/22 0900  vancomycin (VANCOCIN) IVPB 1000 mg/200 mL premix        1,000 mg 200 mL/hr over 60 Minutes Intravenous On call to O.R. 01/19/22 0846 01/19/22 1116        Patient was given sequential compression devices, early ambulation, and chemoprophylaxis to prevent DVT.  Patient benefited maximally from hospital stay and there were no complications.    Recent vital signs: No data found.   Recent laboratory studies: No results for input(s): "WBC", "HGB", "HCT", "PLT", "NA", "K", "CL", "CO2", "BUN", "CREATININE", "GLUCOSE", "INR", "CALCIUM" in the last 72 hours.  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   albuterol 108 (90 Base) MCG/ACT inhaler Commonly known as: VENTOLIN HFA INHALE  2 PUFFS INTO THE LUNGS EVERY 4 HOURS AS NEEDED FOR WHEEZE OR FOR SHORTNESS OF BREATH What changed: See the new instructions.   ALPRAZolam 0.5 MG tablet Commonly known as: XANAX TAKE 1 TABLET BY MOUTH THREE TIMES A DAY AS NEEDED FOR ANXIETY What changed: See the new instructions.   aspirin EC 325 MG tablet Take 1 tablet (325 mg total) by mouth 2 (two) times daily for 20 days. Then resume one 81 mg aspirin once a day. What changed:  medication strength how much to take when to take this additional instructions   atorvastatin 80 MG tablet Commonly known as: LIPITOR TAKE 1 TABLET BY MOUTH DAILY AT 6 PM. What changed: when to take this   buPROPion 150 MG 24 hr tablet Commonly known as: Wellbutrin XL Take 1 tablet (150 mg total) by mouth daily.   citalopram 20 MG tablet Commonly known as: CELEXA TAKE 1 TABLET (20 MG TOTAL) BY MOUTH IN THE MORNING AND AT BEDTIME   famotidine 40 MG tablet Commonly known as: PEPCID TAKE 1 TABLET BY MOUTH EVERY DAY What changed:  when to take this reasons to take this   fluticasone 50 MCG/ACT nasal spray Commonly  known as: FLONASE SPRAY 2 SPRAYS INTO EACH NOSTRIL EVERY DAY What changed: See the new instructions.   hydrochlorothiazide 12.5 MG tablet Commonly known as: HYDRODIURIL Take 12.5 mg by mouth daily. What changed: Another medication with the same name was removed. Continue taking this medication, and follow the directions you see here.   Klor-Con M20 20 MEQ tablet Generic drug: potassium chloride SA TAKE 1 TABLET BY MOUTH EVERY DAY   lamoTRIgine 100 MG tablet Commonly known as: LAMICTAL Take 3 tablets (300 mg total) by mouth at bedtime.   losartan 25 MG tablet Commonly known as: COZAAR Take 25 mg by mouth daily. What changed: Another medication with the same name was removed. Continue taking this medication, and follow the directions you see here.   MAGNESIUM OXIDE PO Take 1 tablet by mouth daily.   methocarbamol 500 MG tablet Commonly known as: ROBAXIN Take 1 tablet (500 mg total) by mouth every 6 (six) hours as needed for muscle spasms.   metoprolol succinate 100 MG 24 hr tablet Commonly known as: TOPROL-XL TAKE 1 TABLET BY MOUTH DAILY. TAKE WITH OR IMMEDIATELY FOLLOWING A MEAL. What changed: additional instructions   montelukast 10 MG tablet Commonly known as: SINGULAIR TAKE 1 TABLET BY MOUTH EVERY DAY IN THE EVENING What changed:  how much to take how to take this when to take this   nitroGLYCERIN 0.4 MG SL tablet Commonly known as: NITROSTAT Place 1 tablet (0.4 mg total) under the tongue every 5 (five) minutes x 3 doses as needed for chest pain.   oxyCODONE 5 MG immediate release tablet Commonly known as: Oxy IR/ROXICODONE Take 1-2 tablets (5-10 mg total) by mouth every 6 (six) hours as needed for moderate pain or severe pain.   pantoprazole 40 MG tablet Commonly known as: PROTONIX Take 1 tablet (40 mg total) by mouth daily. Please schedule a yearly follow up for further refills. Thank you. What changed: additional instructions   Stiolto Respimat 2.5-2.5  MCG/ACT Aers Generic drug: Tiotropium Bromide-Olodaterol INHALE 2 PUFFS BY MOUTH INTO THE LUNGS DAILY What changed: See the new instructions.   VITAMIN C PO Take 1 tablet by mouth daily.   VITAMIN D-3 PO Take 1 capsule by mouth daily.   ZINC PO Take 1 tablet by mouth daily.   zolpidem  10 MG tablet Commonly known as: AMBIEN TAKE 1 TABLET BY MOUTH EVERYDAY AT BEDTIME What changed:  how much to take how to take this when to take this additional instructions               Discharge Care Instructions  (From admission, onward)           Start     Ordered   01/20/22 0000  Weight bearing as tolerated        01/20/22 0751   01/20/22 0000  Change dressing       Comments: You may remove the bulky bandage (ACE wrap and gauze) two days after surgery. You will have an adhesive waterproof bandage underneath. Leave this in place until your first follow-up appointment.   01/20/22 0751            Diagnostic Studies: No results found.  Disposition: Discharge disposition: 01-Home or Self Care       Discharge Instructions     Call MD / Call 911   Complete by: As directed    If you experience chest pain or shortness of breath, CALL 911 and be transported to the hospital emergency room.  If you develope a fever above 101 F, pus (white drainage) or increased drainage or redness at the wound, or calf pain, call your surgeon's office.   Change dressing   Complete by: As directed    You may remove the bulky bandage (ACE wrap and gauze) two days after surgery. You will have an adhesive waterproof bandage underneath. Leave this in place until your first follow-up appointment.   Constipation Prevention   Complete by: As directed    Drink plenty of fluids.  Prune juice may be helpful.  You may use a stool softener, such as Colace (over the counter) 100 mg twice a day.  Use MiraLax (over the counter) for constipation as needed.   Diet - low sodium heart healthy   Complete by:  As directed    Do not put a pillow under the knee. Place it under the heel.   Complete by: As directed    Driving restrictions   Complete by: As directed    No driving for two weeks   Post-operative opioid taper instructions:   Complete by: As directed    POST-OPERATIVE OPIOID TAPER INSTRUCTIONS: It is important to wean off of your opioid medication as soon as possible. If you do not need pain medication after your surgery it is ok to stop day one. Opioids include: Codeine, Hydrocodone(Norco, Vicodin), Oxycodone(Percocet, oxycontin) and hydromorphone amongst others.  Long term and even short term use of opiods can cause: Increased pain response Dependence Constipation Depression Respiratory depression And more.  Withdrawal symptoms can include Flu like symptoms Nausea, vomiting And more Techniques to manage these symptoms Hydrate well Eat regular healthy meals Stay active Use relaxation techniques(deep breathing, meditating, yoga) Do Not substitute Alcohol to help with tapering If you have been on opioids for less than two weeks and do not have pain than it is ok to stop all together.  Plan to wean off of opioids This plan should start within one week post op of your joint replacement. Maintain the same interval or time between taking each dose and first decrease the dose.  Cut the total daily intake of opioids by one tablet each day Next start to increase the time between doses. The last dose that should be eliminated is the evening dose.  TED hose   Complete by: As directed    Use stockings (TED hose) for three weeks on both leg(s).  You may remove them at night for sleeping.   Weight bearing as tolerated   Complete by: As directed         Follow-up Information     Aluisio, Pilar Plate, MD. Schedule an appointment as soon as possible for a visit in 2 week(s).   Specialty: Orthopedic Surgery Contact information: [address] [place]  [address] [phone]                  Signed: Theresa Duty 01/25/2022, 6:52 AM

## 2022-01-26 ENCOUNTER — Ambulatory Visit: Payer: Medicare Other | Admitting: Physical Therapy

## 2022-01-28 ENCOUNTER — Other Ambulatory Visit: Payer: Self-pay | Admitting: Physician Assistant

## 2022-01-28 ENCOUNTER — Other Ambulatory Visit: Payer: Self-pay | Admitting: Internal Medicine

## 2022-01-28 ENCOUNTER — Ambulatory Visit (INDEPENDENT_AMBULATORY_CARE_PROVIDER_SITE_OTHER): Payer: Medicare Other | Admitting: Physical Therapy

## 2022-01-28 ENCOUNTER — Telehealth: Payer: Self-pay | Admitting: Family Medicine

## 2022-01-28 ENCOUNTER — Other Ambulatory Visit: Payer: Self-pay | Admitting: Family Medicine

## 2022-01-28 DIAGNOSIS — M25661 Stiffness of right knee, not elsewhere classified: Secondary | ICD-10-CM | POA: Diagnosis not present

## 2022-01-28 DIAGNOSIS — M25561 Pain in right knee: Secondary | ICD-10-CM | POA: Diagnosis not present

## 2022-01-28 DIAGNOSIS — R2689 Other abnormalities of gait and mobility: Secondary | ICD-10-CM

## 2022-01-28 NOTE — Therapy (Unsigned)
OUTPATIENT PHYSICAL THERAPY LOWER EXTREMITY EVALUATION   Patient Name: Erin Lopez MRN: [id number] DOB:January 20, 1951, 71 y.o., female Today's Date: 01/28/2022    Past Medical History:  Diagnosis Date   Alcoholism in remission [facility])    states last alcohol 11 yrs ago   Anemia    Anxiety    Bipolar 1 disorder ([place])    Admission for mania x 2 (most recent 11/2017)   Cancer [facility])    Cervical spondylosis without myelopathy 12/19/2013   MRI 02/2014: multilevel DDD/spondylosis, not much change compared to prior MRI.  Pt not in favor of invasive therapy for her neck as of 04/2014.   Cholelithiasis    COPD (chronic obstructive pulmonary disease) (HCC)    Moderate: anoro started 04/2017 by pulm, pt symptomatically improved and PFTs stable at f/u 06/2017.   Coronary artery spasm ([place]) 07/11/2018   nitrates=HA. Amlodipine=intol swelling.  Changed to coreg 09/03/18 by cardiologist.   Depression    Dilutional hyponatremia    Endometritis 05/2017   possible; empiric tx with Flagyl by GYN.   Environmental allergies    Fibromyalgia    GERD (gastroesophageal reflux disease)    Hepatic steatosis 03/2019   u/s abd   Herpes zoster 08/07/2017   L scapular region   Hiatal hernia    History of adenomatous polyp of colon 04/2008; 08/2014   No high grade dysplasia: recall 5 yrs ([name], [name])   History of basal cell carcinoma excision    NOSE   History of Helicobacter pylori infection 04/2008   +gastric biopsy (gastritis but no metaplasia, dysplasia, or malignancy identified)   History of kidney stones    History of TIA (transient ischemic attack)    secondary to HELLP syndrome 1994 post c/s--  residual memory loss   Hyperlipidemia    statin started after her NSTEMI: goal  LDL <70.   Hypertension    Internal hemorrhoids    Ischemic cardiomyopathy 05/2018   Echo EF 45-50% in context of NSTEMI from CA spasm. // Echo 8/21: 55-60, mild LVH, normal RVSF, RVSP 40, trivial MR   Left foot  pain    Hallux deformity+adhesive capsulitis+ hammertoe:  Severe structural bunion deformity with hallux interphalangeus and severe arthritis of the second MPJ left foot--surgical repair/osteotomies by [name] 04/2017.   Leg pain    ABIs 11/2019: normal    Memory loss    Abnl MRI brain and CT brain c/w chronic microvascular ischemia.  Repeat MRI brain 02/2014 stable (Dr. Mayra Reel with no plan to f/u with neuro as of 04/2014)   NSTEMI (non-ST elevated myocardial infarction) ([place]) 05/2018   Echo EF 45-50% in context of NSTEMI from CA spasm.// Myoview 8/21: EF 66, no ischemia, low risk   Osteoarthritis of left wrist    STT and 1st [place] jt   Osteoporosis 05/2010   2012 'penia; 06/2015 'porosis:  prolia.  DEXA 09/2017 T-score -2.2 femur neck. 11/2021 T score -2.2. Rpt 2 yrs   Pelvic floor dysfunction    [facility] ([name])   Pneumonia    Postmenopausal vaginal bleeding 05/2017   x 1 small episode; GYN attempted endo bx but unable to penetrate cervix due to severe cerv stenosis (due to postmenopausal state + hx of LEEP).  Endo u/s showed thin endo lining.  Per GYN---no evidence of endomet pathology on w/u---obs as of 07/2017.   Pre-diabetes    Prediabetes 06/2016   Fasting gluc 105; HbA1c at that time was 6.0%.  A1c 6.2% 12/2017.  A1c 5.7% Feb 2023.   Recurrent kidney stones 08/2013   Left ureteral calculus: Perc nephr--cystoscopy w/ureteroscopy + laser for stone removal.  Residual asymptomatic left renal nephrolithiasis <10m present post-procedure.  Right sided hydronephrosis-persistent (on u/s)--urol ordered CT to further eval 08/2016: 1.6 cm nonobstructing stone lower pole left kidney, no hydro, plan for PCN extraction ([facility])   Shortness of breath    Sprain of neck 12/19/2013   Stroke (HCC)    TIA   TMJ (temporomandibular joint disorder)    USES  MOUTH GUARD   Toe fracture, left 12/2017   2nd toe prox phalanx (immobilization, Dr. RZadie Rhine   Tubular adenoma of colon    Vitamin D  deficiency 05/2011   Weak urinary stream 07/2016   with elevated PVR and mild left hydronephrosis secondary to this ([facility] started flomax 0.'4mg'$  qd for this).   Past Surgical History:  Procedure Laterality Date   ABIs  11/29/2019   NORMAL   ANTERIOR CERVICAL DECOMP/DISCECTOMY FUSION  06/30/2009   C3 -- C5  AND EXPLORATION OF FUSION C5-7 W/  PLATE REMOVAL   ARTHRODESIS METATARSALPHALANGEAL JOINT (MTPJ) Left 07/08/2021   Procedure: Left hallux metatarsophalangeal joint arthrodesis;  Surgeon: HWylene Simmer MD;  [place]  Service: Orthopedics;  Laterality: Left;   BACK SURGERY     BUNIONECTOMY Left    CARDIOVASCULAR STRESS TEST  12/03/2019   myoc perf im: NORMAL   CERVICAL FUSION  2002   anterior C5 -- C7   CERVICAL SPINE SURGERY  08/27/1999     C6 -- T1  LAMINECTOMY/  DISKECTOMY   CESAREAN SECTION  1994   CHOLECYSTECTOMY N/A 03/06/2020   Procedure: LAPAROSCOPIC CHOLECYSTECTOMY;  Surgeon: SFelicie Morn MD;  [place]  Service: General;  Laterality: N/A;   COLONOSCOPY W/ POLYPECTOMY  04/2008;08/2014   2016 tubular adenoma x 1; +diverticulosis and int/ext hemorrhoids.  06/2020 adenoma, recall 5 yrs.   CYSTOSCOPY WITH URETEROSCOPY AND STENT PLACEMENT Left 08/28/2013   Procedure: CYSTOSCOPY, RGP,  WITH URETEROSCOPY AND STENT REMOVAL;  Surgeon: DBernestine Amass MD;  [facility]  Service: Urology;  Laterality: Left;   DEXA  09/2017   T-score -2.2 femur neck: improved compared to 06/2015.  2021 DEXA T score -2.3.  11/2021 DEXA T score -2.2.   ESOPHAGOGASTRODUODENOSCOPY  2010; 08/2014   2016 +Candidal esophagitis; Mild chronic gastritis w/intestinal metaplasia--NEG H pylori, neg for eosinophilic esoph. 37/9024esoph candidiasis (diflucan x 20d), o/w normal.   HAMMER TOE SURGERY Left 07/08/2021   Procedure: Second metatarsal head excision; revision Second hammertoe correction; 2-5 percutaneus flexor tenotomies;  Surgeon:  HWylene Simmer MD;  [place]  Service: Orthopedics;  Laterality: Left;   HARDWARE REMOVAL Left 07/08/2021   Procedure: Removal of deep implants from hallux proximal phalanx and First and Second metatarsals;  Surgeon: HWylene Simmer MD;  [place] [facility]  Service: Orthopedics;  Laterality: Left;   HOLMIUM LASER APPLICATION Left 009/73/5329  Procedure: HOLMIUM LASER APPLICATION;  Surgeon: DBernestine Amass MD;  [facility]  Service: Urology;  Laterality: Left;   JOINT REPLACEMENT     LEFT HEART CATH AND CORONARY ANGIOGRAPHY N/A 05/15/2018   Evidence for spasm in distal LAD.  Mod RCA atherosclerosis, o/w no CAD.  Procedure: LEFT HEART CATH AND CORONARY ANGIOGRAPHY;  Surgeon: HLeonie Man MD;  [facility];  Service: Cardiovascular;  Laterality: N/A;   NEGATIVE SLEEP STUDY  04/01/2007  NEPHROLITHOTOMY Left 08/05/2013   Procedure: NEPHROLITHOTOMY PERCUTANEOUS;  Surgeon: [name], MD;  Location[facility];  Service: Urology;  Laterality: Left;   POLYSOMNOGRAM  01/2019   NORMAL   POLYSOMNOGRAPHY  10/25/2018   Dr. Dion Saucier due to pt inadequat sleep time (83 min).   TONSILLECTOMY AND ADENOIDECTOMY  1975   TOTAL KNEE ARTHROPLASTY Right 2003   TOTAL KNEE REVISION Right 01/19/2022   Procedure: Right knee polyethylene revision;  Surgeon: Gaynelle Arabian, MD;  Location: [facility];  Service: Orthopedics;  Laterality: Right;   TRANSTHORACIC ECHOCARDIOGRAM  05/2018; 12/03/2019   (after MI secondary to arterial spasm): EF 45-50%; severe hypokinesis of apical anterolateral, inferolateral , anterior, and inferior LV myocardium. 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    Shortness of breath 04/19/2019   Healthcare maintenance 04/19/2019   Overweight (BMI 25.0-29.9) 02/19/2019   Ischemic cardiomyopathy 09/03/2018   Chest pain 07/11/2018   Coronary vasospasm ([place]) 07/11/2018   NSTEMI (non-ST elevated myocardial infarction) ([place]) 05/14/2018   Acute encephalopathy 11/19/2017   Hyponatremia 11/16/2017   Bipolar 1 disorder ([place]) 12/29/2016   Hypertension 12/29/2016   TIA (transient ischemic attack) 12/29/2016   Chronic pelvic pain in female 08/22/2016   Stage 2 chronic kidney disease 08/22/2016   Osteoporosis 09/30/2015   Health maintenance examination 06/11/2015   Suprapubic discomfort 06/05/2015   Urethral caruncle 06/05/2015   Infection of urinary tract 06/05/2015   TMJ (temporomandibular joint disorder) 02/25/2015   Right ankle injury 11/05/2014   Cervical spondylosis without myelopathy 12/19/2013   Sprain of neck 12/19/2013   Memory difficulties 12/19/2013   Postnasal drip 10/28/2013   Renal calculus 08/05/2013   Urinary frequency 08/01/2013   Constipation, chronic 07/29/2013   COPD (chronic obstructive pulmonary disease) ([place]) 06/30/2013   Abnormal chest x-ray 06/30/2013   Benign essential HTN 05/29/2013   Kidney stone on left side 05/29/2013   Hyperlipidemia 05/29/2013    PCP: ***  REFERRING PROVIDER: ***  REFERRING DIAG: ***  THERAPY DIAG:  No diagnosis found.  Rationale for Evaluation and Treatment: Rehabilitation  ONSET DATE: ***  SUBJECTIVE:   SUBJECTIVE STATEMENT: R totatl knee revision surgery 10/18, discharged 10/19.    PERTINENT HISTORY: *** PAIN:  Are you having pain? Yes: NPRS scale: ***/10 Pain location: *** Pain description: *** Aggravating factors: *** Relieving factors: ***   PRECAUTIONS: {Therapy precautions:24002}  WEIGHT BEARING RESTRICTIONS: {Yes ***/No:24003}  FALLS:  Has patient fallen in last 6 months? {fallsyesno:27318}  LIVING ENVIRONMENT: Lives with: {OP[facility] lives with:25569::"lives with  their family"} Lives in: {Lives in:25570} Stairs: {opstairs:27293} Has following equipment at home: {Assistive devices:23999}  OCCUPATION: ***  PLOF: {PLOF:24004}  PATIENT GOALS: ***   OBJECTIVE:   DIAGNOSTIC FINDINGS: ***  COGNITION: Overall cognitive status: Within functional limits for tasks assessed     SENSATION: WFL  EDEMA:  Mild edema in R knee and lower leg   POSTURE: standing with R knee in flexion   PALPATION: ***  LOWER EXTREMITY ROM:  {AROM/PROM:27142} ROM Right eval Left eval  Hip flexion    Hip extension    Hip abduction    Hip adduction    Hip internal rotation    Hip external rotation    Knee flexion 105   Knee extension -5  Ankle dorsiflexion    Ankle plantarflexion    Ankle inversion    Ankle eversion     (Blank rows = not tested)  LOWER EXTREMITY MMT:  MMT Right eval Left eval  Hip flexion    Hip extension    Hip abduction    Hip adduction    Hip internal rotation    Hip external rotation    Knee flexion    Knee extension    Ankle dorsiflexion    Ankle plantarflexion    Ankle inversion    Ankle eversion     (Blank rows = not tested)  LOWER EXTREMITY SPECIAL TESTS:  {LEspecialtests:26242}  FUNCTIONAL TESTS:  {Functional tests:24029}  GAIT: Distance walked: *** Assistive device utilized: {Assistive devices:23999} Level of assistance: {Levels of assistance:24026} Comments: ***   TODAY'S TREATMENT:                                                                                                                              DATE: Gilford Rile-    PATIENT EDUCATION:  Education details: *** Person educated: {Person educated:25204} Education method: {Education Method:25205} Education comprehension: {Education Comprehension:25206}  HOME EXERCISE PROGRAM: Access Code: [email or web address] URL: [email or web address] Date: 01/28/2022 Prepared by: Lyndee Hensen  Exercises - Supine Heel Slide  - 3 x daily - 1-2  sets - 10 reps - Supine Quadricep Sets  - 3 x daily - 1-2 sets - 10 reps - Supine Ankle Pumps  - 3 x daily - 1-2 sets - 10 reps - Straight Leg Raise  - 1-2 x daily - 1-2 sets - 10 reps - Seated Knee Extension AROM  - 3 x daily - 1-2 sets - 10 reps - 3 hold  ASSESSMENT:  CLINICAL IMPRESSION: Patient is a *** y.o. *** who was seen today for physical therapy evaluation and treatment for ***.   OBJECTIVE IMPAIRMENTS: {opptimpairments:25111}.   ACTIVITY LIMITATIONS: {activitylimitations:27494}  PARTICIPATION LIMITATIONS: {participationrestrictions:25113}  PERSONAL FACTORS: {Personal factors:25162} are also affecting patient's functional outcome.   REHAB POTENTIAL: {rehabpotential:25112}  CLINICAL DECISION MAKING: {clinical decision making:25114}  EVALUATION COMPLEXITY: {Evaluation complexity:25115}   GOALS: Goals reviewed with patient? {yes/no:20286}  SHORT TERM GOALS: Target date: {follow up:25551}  *** Baseline: Goal status: {GOALSTATUS:25110}  2.  *** Baseline:  Goal status: {GOALSTATUS:25110}  3.  *** Baseline:  Goal status: {GOALSTATUS:25110}  4.  *** Baseline:  Goal status: {GOALSTATUS:25110}  5.  *** Baseline:  Goal status: {GOALSTATUS:25110}  6.  *** Baseline:  Goal status: {GOALSTATUS:25110}  LONG TERM GOALS: Target date: {follow up:25551}   *** Baseline:  Goal status: {GOALSTATUS:25110}  2.  *** Baseline:  Goal status: {GOALSTATUS:25110}  3.  *** Baseline:  Goal status: {GOALSTATUS:25110}  4.  *** Baseline:  Goal status: {GOALSTATUS:25110}  5.  *** Baseline:  Goal status: {GOALSTATUS:25110}  6.  *** Baseline:  Goal status: {GOALSTATUS:25110}   PLAN:  PT FREQUENCY: {rehab frequency:25116}  PT DURATION: {rehab duration:25117}  PLANNED INTERVENTIONS: {rehab planned interventions:25118::"Therapeutic exercises","Therapeutic  activity","Neuromuscular re-education","Balance training","Gait training","Patient/Family education","Self  Care","Joint mobilization"}  PLAN FOR NEXT SESSION: ***   Lyndee Hensen, PT 01/28/2022, 10:22 AM

## 2022-01-28 NOTE — Telephone Encounter (Signed)
Patient had a hard time fining a ride to today's Evaluation for physical therapy. {Patient called back and scheduled 1 treatment for 01/31/22. Patient is unable to book furhter sessions as she has no ride to bring her but a friend.   Patient cannot drive, has no other transportation method.   Is this patient a candidate for home health therapy?   Insurance: [employer] MCR    Thank you.

## 2022-01-30 ENCOUNTER — Encounter: Payer: Self-pay | Admitting: Physical Therapy

## 2022-01-31 ENCOUNTER — Ambulatory Visit (INDEPENDENT_AMBULATORY_CARE_PROVIDER_SITE_OTHER): Payer: Medicare Other | Admitting: Physical Therapy

## 2022-01-31 DIAGNOSIS — M25561 Pain in right knee: Secondary | ICD-10-CM | POA: Diagnosis not present

## 2022-01-31 DIAGNOSIS — R2689 Other abnormalities of gait and mobility: Secondary | ICD-10-CM | POA: Diagnosis not present

## 2022-01-31 DIAGNOSIS — M25661 Stiffness of right knee, not elsewhere classified: Secondary | ICD-10-CM | POA: Diagnosis not present

## 2022-01-31 NOTE — Therapy (Signed)
OUTPATIENT PHYSICAL THERAPY LOWER EXTREMITY TREATMENT    Patient Name: Erin Lopez MRN: [id number] DOB:1951-03-25, 71 y.o., female Today's Date: 01/31/2022   PT End of Session - 01/31/22 1506     Visit Number 2    Number of Visits 16    Date for PT Re-Evaluation 03/25/22    Authorization Type UHC- MCR    PT Start Time 1350    PT Stop Time 4098    PT Time Calculation (min) 42 min    Activity Tolerance Patient tolerated treatment well    Behavior During Therapy WFL for tasks assessed/performed             Past Medical History:  Diagnosis Date   Alcoholism in remission ([facility])    states last alcohol 11 yrs ago   Anemia    Anxiety    Bipolar 1 disorder ([place])    Admission for mania x 2 (most recent 11/2017)   Cancer ([place])    Cervical spondylosis without myelopathy 12/19/2013   MRI 02/2014: multilevel DDD/spondylosis, not much change compared to prior MRI.  Pt not in favor of invasive therapy for her neck as of 04/2014.   Cholelithiasis    COPD (chronic obstructive pulmonary disease) (HCC)    Moderate: anoro started 04/2017 by pulm, pt symptomatically improved and PFTs stable at f/u 06/2017.   Coronary artery spasm ([place]) 07/11/2018   nitrates=HA. Amlodipine=intol swelling.  Changed to coreg 09/03/18 by cardiologist.   Depression    Dilutional hyponatremia    Endometritis 05/2017   possible; empiric tx with Flagyl by GYN.   Environmental allergies    Fibromyalgia    GERD (gastroesophageal reflux disease)    Hepatic steatosis 03/2019   u/s abd   Herpes zoster 08/07/2017   L scapular region   Hiatal hernia    History of adenomatous polyp of colon 04/2008; 08/2014   No high grade dysplasia: recall 5 yrs ([name], [name])   History of basal cell carcinoma excision    NOSE   History of Helicobacter pylori infection 04/2008   +gastric biopsy (gastritis but no metaplasia, dysplasia, or malignancy identified)   History of kidney stones    History of TIA (transient  ischemic attack)    secondary to HELLP syndrome 1994 post c/s--  residual memory loss   Hyperlipidemia    statin started after her NSTEMI: goal  LDL <70.   Hypertension    Internal hemorrhoids    Ischemic cardiomyopathy 05/2018   Echo EF 45-50% in context of NSTEMI from CA spasm. 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 04/2017.   Leg pain    ABIs 11/2019: normal    Memory loss    Abnl MRI brain and CT brain c/w chronic microvascular ischemia.  Repeat MRI brain 02/2014 stable (Dr. Mayra Reel with no plan to f/u with neuro as of 04/2014)   NSTEMI (non-ST elevated myocardial infarction) ([place]) 05/2018   Echo EF 45-50% in context of NSTEMI from CA spasm.// Myoview 8/21: EF 66, no ischemia, low risk   Osteoarthritis of left wrist    STT and 1st [place] jt   Osteoporosis 05/2010   2012 'penia; 06/2015 'porosis:  prolia.  DEXA 09/2017 T-score -2.2 femur neck. 11/2021 T score -2.2. Rpt 2 yrs  Pelvic floor dysfunction    [facility] ([name])   Pneumonia    Postmenopausal vaginal bleeding 05/2017   x 1 small episode; GYN attempted endo bx but unable to penetrate cervix due to severe cerv stenosis (due to postmenopausal state + hx of LEEP).  Endo u/s showed thin endo lining.  Per GYN---no evidence of endomet pathology on w/u---obs as of 07/2017.   Pre-diabetes    Prediabetes 06/2016   Fasting gluc 105; HbA1c at that time was 6.0%.  A1c 6.2% 12/2017.  A1c 5.7% Feb 2023.   Recurrent kidney stones 08/2013   Left ureteral calculus: Perc nephr--cystoscopy w/ureteroscopy + laser for stone removal.  Residual asymptomatic left renal nephrolithiasis <71m present post-procedure.  Right sided hydronephrosis-persistent (on u/s)--urol ordered CT to further eval  08/2016: 1.6 cm nonobstructing stone lower pole left kidney, no hydro, plan for PCN extraction ([facility])   Shortness of breath    Sprain of neck 12/19/2013   Stroke (HCC)    TIA   TMJ (temporomandibular joint disorder)    USES  MOUTH GUARD   Toe fracture, left 12/2017   2nd toe prox phalanx (immobilization, Dr. RZadie Rhine   Tubular adenoma of colon    Vitamin D deficiency 05/2011   Weak urinary stream 07/2016   with elevated PVR and mild left hydronephrosis secondary to this ([facility] started flomax 0.457mqd for this).   Past Surgical History:  Procedure Laterality Date   ABIs  11/29/2019   NORMAL   ANTERIOR CERVICAL DECOMP/DISCECTOMY FUSION  06/30/2009   C3 -- C5  AND EXPLORATION OF FUSION C5-7 W/  PLATE REMOVAL   ARTHRODESIS METATARSALPHALANGEAL JOINT (MTPJ) Left 07/08/2021   Procedure: Left hallux metatarsophalangeal joint arthrodesis;  Surgeon: HeWylene SimmerMD;  [facility] Service: [facility]opedics;  Laterality: Left;   BACK SURGERY     BUNIONECTOMY Left    CARDIOVASCULAR STRESS TEST  12/03/2019   myoc perf im: NORMAL   CERVICAL FUSION  2002   anterior C5 -- C7   CERVICAL SPINE SURGERY  08/27/1999     C6 -- T1  LAMINECTOMY/  DISKECTOMY   CESAREAN SECTION  1994   CHOLECYSTECTOMY N/A 03/06/2020   Procedure: LAPAROSCOPIC CHOLECYSTECTOMY;  Surgeon: StFelicie MornMD;  Location: [facility] Service: General;  Laterality: N/A;   COLONOSCOPY W/ POLYPECTOMY  04/2008;08/2014   2016 tubular adenoma x 1; +diverticulosis and int/ext hemorrhoids.  06/2020 adenoma, recall 5 yrs.   CYSTOSCOPY WITH URETEROSCOPY AND STENT PLACEMENT Left 08/28/2013   Procedure: CYSTOSCOPY, RGP,  WITH URETEROSCOPY AND STENT REMOVAL;  Surgeon: DaBernestine AmassMD;  [facility];  Laterality: Left;   DEXA  09/2017   T-score -2.2 femur neck: improved compared to 06/2015.  2021 DEXA T score -2.3.  11/2021 DEXA T score -2.2.    ESOPHAGOGASTRODUODENOSCOPY  2010; 08/2014   2016 +Candidal esophagitis; Mild chronic gastritis w/intestinal metaplasia--NEG H pylori, neg for eosinophilic esoph. 3/4/4315soph candidiasis (diflucan x 20d), o/w normal.   HAMMER TOE SURGERY Left 07/08/2021   Procedure: Second metatarsal head excision; revision Second hammertoe correction; 2-5 percutaneus flexor tenotomies;  Surgeon: HeWylene SimmerMD;  [facility];  Laterality: Left;   HARDWARE REMOVAL Left 07/08/2021   Procedure: Removal of deep implants from hallux proximal phalanx and First and Second metatarsals;  Surgeon: HeWylene SimmerMD;  [facility]: [facility]ics;  Laterality: Left;   HOLMIUM LASER  APPLICATION Left 93/71/6967   Procedure: HOLMIUM LASER APPLICATION;  Surgeon: [name], MD;  Location[facility];  Service: Urology;  Laterality: Left;   JOINT REPLACEMENT     LEFT HEART CATH AND CORONARY ANGIOGRAPHY N/A 05/15/2018   Evidence for spasm in distal LAD.  Mod RCA atherosclerosis, o/w no CAD.  Procedure: LEFT HEART CATH AND CORONARY ANGIOGRAPHY;  Surgeon: Leonie Man, MD;  Location: [facility];  Service: Cardiovascular;  Laterality: N/A;   NEGATIVE SLEEP STUDY  04/01/2007   NEPHROLITHOTOMY Left 08/05/2013   Procedure: NEPHROLITHOTOMY PERCUTANEOUS;  Surgeon: [name], MD;  Location: [facility];  Service: Urology;  Laterality: Left;   POLYSOMNOGRAM  01/2019   NORMAL   POLYSOMNOGRAPHY  10/25/2018   Dr. Dion Saucier due to pt inadequat sleep time (83 min).   TONSILLECTOMY AND ADENOIDECTOMY  1975   TOTAL KNEE ARTHROPLASTY Right 2003   TOTAL KNEE REVISION Right 01/19/2022   Procedure: Right knee polyethylene revision;  Surgeon: Gaynelle Arabian, MD;  Location: [facility];  Service: Orthopedics;  Laterality: Right;   TRANSTHORACIC ECHOCARDIOGRAM  05/2018; 12/03/2019   (after MI secondary to arterial spasm): EF  45-50%; severe hypokinesis of apical anterolateral, inferolateral , anterior, and inferior LV myocardium. 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   Shortness of breath 04/19/2019   Healthcare maintenance 04/19/2019   Overweight (BMI 25.0-29.9) 02/19/2019   Ischemic cardiomyopathy 09/03/2018   Chest pain 07/11/2018   Coronary vasospasm ([place]) 07/11/2018   NSTEMI (non-ST elevated myocardial infarction) ([place]) 05/14/2018   Acute encephalopathy 11/19/2017   Hyponatremia 11/16/2017   Bipolar 1 disorder ([place]) 12/29/2016   Hypertension 12/29/2016   TIA (transient ischemic attack) 12/29/2016   Chronic pelvic pain in female 08/22/2016   Stage 2 chronic kidney disease 08/22/2016   Osteoporosis 09/30/2015   Health maintenance examination 06/11/2015   Suprapubic discomfort 06/05/2015   Urethral caruncle 06/05/2015   Infection of urinary tract 06/05/2015   TMJ (temporomandibular joint disorder) 02/25/2015   Right ankle injury 11/05/2014   Cervical spondylosis without myelopathy 12/19/2013   Sprain of neck 12/19/2013   Memory difficulties 12/19/2013   Postnasal drip 10/28/2013   Renal calculus 08/05/2013   Urinary frequency 08/01/2013   Constipation, chronic 07/29/2013   COPD (chronic obstructive pulmonary disease) ([place]) 06/30/2013   Abnormal chest x-ray 06/30/2013   Benign essential HTN 05/29/2013   Kidney stone on left side 05/29/2013   Hyperlipidemia 05/29/2013    PCP: Shawnie Dapper  REFERRING PROVIDER: Jearld Lesch  REFERRING DIAG: presence of Right artificial knee joint  THERAPY DIAG:  Acute pain of right knee  Stiffness of right knee, not elsewhere classified  Other abnormalities of gait and mobility  Rationale for Evaluation and Treatment:  Rehabilitation  ONSET DATE: 01/19/22  SUBJECTIVE:   SUBJECTIVE STATEMENT:  01/31/2022 Pt with no new complaints. Not wearing compression sock, because it is too uncomfortable.   Eval: Pt had R total knee revision surgery 10/18, discharged 10/19. Doing well, has been using RW. She is having a hard time with transportation to appointments. F/U with MD on 10/30.    PERTINENT HISTORY: Bipolar, TIA, Osteoporosis, CKD ,COPD,    PAIN:  Are you having pain? Yes: NPRS scale: 6/10 Pain location: R knee Pain  description: Sore, Tight,  Aggravating factors: Movement, bending, walking  Relieving factors: Rest, ice    PRECAUTIONS: Knee  WEIGHT BEARING RESTRICTIONS: No  FALLS:  Has patient fallen in last 6 months? No  PLOF: Independent  PATIENT GOALS:  Decreased pain, regain use of knee, walking.    OBJECTIVE:   DIAGNOSTIC FINDINGS:   COGNITION: Overall cognitive status: Within functional limits for tasks assessed     SENSATION: WFL  EDEMA:  Mild edema in R knee and lower leg   POSTURE: standing with R knee in flexion   PALPATION:  healing incision on R knee, covered with dressing.    LOWER EXTREMITY ROM:  Active ROM Right eval Left eval  Hip flexion    Hip extension    Hip abduction    Hip adduction    Hip internal rotation    Hip external rotation    Knee flexion 105   Knee extension -5   Ankle dorsiflexion    Ankle plantarflexion    Ankle inversion    Ankle eversion     (Blank rows = not tested)  LOWER EXTREMITY MMT:  MMT Right eval Left eval  Hip flexion    Hip extension    Hip abduction    Hip adduction    Hip internal rotation    Hip external rotation    Knee flexion 4   Knee extension 4-   Ankle dorsiflexion    Ankle plantarflexion    Ankle inversion    Ankle eversion     (Blank rows = not tested)  LOWER EXTREMITY SPECIAL TESTS:    GAIT: Distance walked: 100 Assistive device utilized: Walker - 2 wheeled Level of assistance:  Complete Independence Comments: increased knee flexion in stance phase    TODAY'S TREATMENT:                                                                                                                              DATE:  01/31/2022  Therapeutic Exercise: Aerobic: Supine: Heel slides x 20; With strap x 10;  SLR 2 x 10 bil;  Quad sets x 20 on R; Ankle pumps x 20; SAQ x 20 on R:  Seated: LAQ x 20 on R;  Heel slides x 10 on R;  Standing: L/R weight shifts x 20 for practice for TKE on R;  Ambulation: 45 ft x 6, with RW, cuing for TKE and upright posture  Stretches: Seated HSS 30 sec x 5 on R:  Neuromuscular Re-education: Manual Therapy: Gait:    PATIENT EDUCATION:  Education details: PT POC, Exam findings, HEP Person educated: Patient Education method: Explanation, Demonstration, Tactile cues, Verbal cues, and Handouts Education comprehension: verbalized understanding, returned demonstration, verbal cues required, tactile cues required, and needs further education  HOME EXERCISE PROGRAM: Access Code: [email or web address]   ASSESSMENT:  CLINICAL IMPRESSION:  01/31/2022  Pt with good tolerance for ther ex today. Improved Flexion from last visit. Education on hamstring stretch  and importance of HEP for improving knee extension. Plan to progress as tolerated. Pt will ask about thigh high compression socks at MD visit tomorrow. Encouraged to continue PT visits, despite difficulty with transportation.   Eval: Patient presents with deficits from R total knee revision on 01/19/22. She has decreased ROM, and mild joint stiffness for flexion and extension. She has decreased strength in quad and LE. She has decreased ability for gait, stairs, and functional activities due to pain and deficit. Pt to benefit from skilled PT to improve deficits and return to PLOF.   OBJECTIVE IMPAIRMENTS: Abnormal gait, decreased activity tolerance, decreased balance, decreased knowledge of use of DME, decreased mobility,  difficulty walking, decreased ROM, decreased strength, decreased safety awareness, hypomobility, increased muscle spasms, impaired flexibility, and pain.   ACTIVITY LIMITATIONS: lifting, bending, sitting, standing, squatting, stairs, transfers, bed mobility, bathing, and locomotion level  PARTICIPATION LIMITATIONS: meal prep, cleaning, laundry, driving, shopping, and community activity  PERSONAL FACTORS:  none  are also affecting patient's functional outcome.   REHAB POTENTIAL: Good  CLINICAL DECISION MAKING: Stable/uncomplicated  EVALUATION COMPLEXITY: Low   GOALS: Goals reviewed with patient? Yes  SHORT TERM GOALS: Target date: 02/11/2022   Pt to be independent with initial HEP  Goal status: INITIAL  2.  Pt to demo improved Flexion ROM by at least 10 deg, and at least 3 deg of extension  in R knee.    Goal status: INITIAL    LONG TERM GOALS: Target date: 03/25/2022    Pt to be independent with final HEP  Goal status: INITIAL   2.  Pt to demo full ROM of R knee, to improve ability for ambulation and IADLs.   Goal status: INITIAL  3.  Pt to demo improved strength of R LE to at least 4+/5 to improve ability for walking, stairs, and IADLs.   Goal status: INITIAL  4.  Pt to demo ability for independent ambulation with mechanics WNL for pt age, with LRAD, for community distances.   Goal status: INITIAL  5.  Pt to demo ability for stair climbing, at least 5 steps, independently with 1 hand rail, to improve ability for community navigation.   Goal status: INITIAL    PLAN:  PT FREQUENCY: 2x/week  PT DURATION: 8 weeks  PLANNED INTERVENTIONS: Therapeutic exercises, Therapeutic activity, Neuromuscular re-education, Balance training, Gait training, Patient/Family education, Self Care, Joint mobilization, Joint manipulation, Stair training, Orthotic/Fit training, DME instructions, Dry Needling, Electrical stimulation, Spinal manipulation, Spinal mobilization,  Cryotherapy, Moist heat, Taping, Vasopneumatic device, Traction, Ultrasound, Ionotophoresis '4mg'$ /ml Dexamethasone, and Manual therapy  PLAN FOR NEXT SESSION:    Lyndee Hensen, PT, DPT 3:07 PM  01/31/22

## 2022-01-31 NOTE — Telephone Encounter (Signed)
I defer to her orthopedist who did recent surgery

## 2022-02-01 NOTE — Telephone Encounter (Signed)
noted 

## 2022-02-01 NOTE — Telephone Encounter (Signed)
FYI  Please see below

## 2022-02-03 ENCOUNTER — Encounter: Payer: Self-pay | Admitting: Physical Therapy

## 2022-02-03 ENCOUNTER — Ambulatory Visit: Payer: Medicare Other | Admitting: Physical Therapy

## 2022-02-03 DIAGNOSIS — R2689 Other abnormalities of gait and mobility: Secondary | ICD-10-CM

## 2022-02-03 DIAGNOSIS — M25661 Stiffness of right knee, not elsewhere classified: Secondary | ICD-10-CM | POA: Diagnosis not present

## 2022-02-03 DIAGNOSIS — M25561 Pain in right knee: Secondary | ICD-10-CM

## 2022-02-03 DIAGNOSIS — M25675 Stiffness of left foot, not elsewhere classified: Secondary | ICD-10-CM | POA: Diagnosis not present

## 2022-02-03 NOTE — Therapy (Signed)
OUTPATIENT PHYSICAL THERAPY LOWER EXTREMITY TREATMENT    Patient Name: Erin Lopez MRN: [id number] DOB:1950-12-19, 71 y.o., female Today's Date: 02/03/2022   PT End of Session - 02/03/22 1259     Visit Number 3    Number of Visits 16    Date for PT Re-Evaluation 03/25/22    Authorization Type UHC- MCR    PT Start Time 3220    PT Stop Time 1340    PT Time Calculation (min) 45 min    Activity Tolerance Patient tolerated treatment well    Behavior During Therapy WFL for tasks assessed/performed             Past Medical History:  Diagnosis Date   Alcoholism in remission ([place])    states last alcohol 11 yrs ago   Anemia    Anxiety    Bipolar 1 disorder ([place])    Admission for mania x 2 (most recent 11/2017)   Cancer ([place])    Cervical spondylosis without myelopathy 12/19/2013   MRI 02/2014: multilevel DDD/spondylosis, not much change compared to prior MRI.  Pt not in favor of invasive therapy for her neck as of 04/2014.   Cholelithiasis    COPD (chronic obstructive pulmonary disease) (HCC)    Moderate: anoro started 04/2017 by pulm, pt symptomatically improved and PFTs stable at f/u 06/2017.   Coronary artery spasm ([place]) 07/11/2018   nitrates=HA. Amlodipine=intol swelling.  Changed to coreg 09/03/18 by cardiologist.   Depression    Dilutional hyponatremia    Endometritis 05/2017   possible; empiric tx with Flagyl by GYN.   Environmental allergies    Fibromyalgia    GERD (gastroesophageal reflux disease)    Hepatic steatosis 03/2019   u/s abd   Herpes zoster 08/07/2017   L scapular region   Hiatal hernia    History of adenomatous polyp of colon 04/2008; 08/2014   No high grade dysplasia: recall 5 yrs ([name], [name])   History of basal cell carcinoma excision    NOSE   History of Helicobacter pylori infection 04/2008   +gastric biopsy (gastritis but no metaplasia, dysplasia, or malignancy identified)   History of kidney stones    History of TIA (transient  ischemic attack)    secondary to HELLP syndrome 1994 post c/s--  residual memory loss   Hyperlipidemia    statin started after her NSTEMI: goal  LDL <70.   Hypertension    Internal hemorrhoids    Ischemic cardiomyopathy 05/2018   Echo EF 45-50% in context of NSTEMI from CA spasm. 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 04/2017.   Leg pain    ABIs 11/2019: normal    Memory loss    Abnl MRI brain and CT brain c/w chronic microvascular ischemia.  Repeat MRI brain 02/2014 stable (Dr. Mayra Reel with no plan to f/u with neuro as of 04/2014)   NSTEMI (non-ST elevated myocardial infarction) ([place]) 05/2018   Echo EF 45-50% in context of NSTEMI from CA spasm.// Myoview 8/21: EF 66, no ischemia, low risk   Osteoarthritis of left wrist    STT and 1st [place] jt   Osteoporosis 05/2010   2012 'penia; 06/2015 'porosis:  prolia.  DEXA 09/2017 T-score -2.2 femur neck. 11/2021 T score -2.2. Rpt 2 yrs  Pelvic floor dysfunction    [facility] ([name])   Pneumonia    Postmenopausal vaginal bleeding 05/2017   x 1 small episode; GYN attempted endo bx but unable to penetrate cervix due to severe cerv stenosis (due to postmenopausal state + hx of LEEP).  Endo u/s showed thin endo lining.  Per GYN---no evidence of endomet pathology on w/u---obs as of 07/2017.   Pre-diabetes    Prediabetes 06/2016   Fasting gluc 105; HbA1c at that time was 6.0%.  A1c 6.2% 12/2017.  A1c 5.7% Feb 2023.   Recurrent kidney stones 08/2013   Left ureteral calculus: Perc nephr--cystoscopy w/ureteroscopy + laser for stone removal.  Residual asymptomatic left renal nephrolithiasis <71m present post-procedure.  Right sided hydronephrosis-persistent (on u/s)--urol ordered CT to further eval  08/2016: 1.6 cm nonobstructing stone lower pole left kidney, no hydro, plan for PCN extraction ([facility])   Shortness of breath    Sprain of neck 12/19/2013   Stroke (HCC)    TIA   TMJ (temporomandibular joint disorder)    USES  MOUTH GUARD   Toe fracture, left 12/2017   2nd toe prox phalanx (immobilization, Dr. RZadie Rhine   Tubular adenoma of colon    Vitamin D deficiency 05/2011   Weak urinary stream 07/2016   with elevated PVR and mild left hydronephrosis secondary to this ([facility] started flomax 0.457mqd for this).   Past Surgical History:  Procedure Laterality Date   ABIs  11/29/2019   NORMAL   ANTERIOR CERVICAL DECOMP/DISCECTOMY FUSION  06/30/2009   C3 -- C5  AND EXPLORATION OF FUSION C5-7 W/  PLATE REMOVAL   ARTHRODESIS METATARSALPHALANGEAL JOINT (MTPJ) Left 07/08/2021   Procedure: Left hallux metatarsophalangeal joint arthrodesis;  Surgeon: HeWylene SimmerMD;  [facility]: [facility]opedics;  Laterality: Left;   BACK SURGERY     BUNIONECTOMY Left    CARDIOVASCULAR STRESS TEST  12/03/2019   myoc perf im: NORMAL   CERVICAL FUSION  2002   anterior C5 -- C7   CERVICAL SPINE SURGERY  08/27/1999     C6 -- T1  LAMINECTOMY/  DISKECTOMY   CESAREAN SECTION  1994   CHOLECYSTECTOMY N/A 03/06/2020   Procedure: LAPAROSCOPIC CHOLECYSTECTOMY;  Surgeon: StFelicie MornMD;  Location: [facility] Service: General;  Laterality: N/A;   COLONOSCOPY W/ POLYPECTOMY  04/2008;08/2014   2016 tubular adenoma x 1; +diverticulosis and int/ext hemorrhoids.  06/2020 adenoma, recall 5 yrs.   CYSTOSCOPY WITH URETEROSCOPY AND STENT PLACEMENT Left 08/28/2013   Procedure: CYSTOSCOPY, RGP,  WITH URETEROSCOPY AND STENT REMOVAL;  Surgeon: DaBernestine AmassMD;  [facility];  Laterality: Left;   DEXA  09/2017   T-score -2.2 femur neck: improved compared to 06/2015.  2021 DEXA T score -2.3.  11/2021 DEXA T score -2.2.    ESOPHAGOGASTRODUODENOSCOPY  2010; 08/2014   2016 +Candidal esophagitis; Mild chronic gastritis w/intestinal metaplasia--NEG H pylori, neg for eosinophilic esoph. 3/4/4315soph candidiasis (diflucan x 20d), o/w normal.   HAMMER TOE SURGERY Left 07/08/2021   Procedure: Second metatarsal head excision; revision Second hammertoe correction; 2-5 percutaneus flexor tenotomies;  Surgeon: HeWylene SimmerMD;  [facility];  Laterality: Left;   HARDWARE REMOVAL Left 07/08/2021   Procedure: Removal of deep implants from hallux proximal phalanx and First and Second metatarsals;  Surgeon: HeWylene SimmerMD;  [facility]: [facility];  Laterality: Left;   HOLMIUM LASER  APPLICATION Left 43/32/9518   Procedure: HOLMIUM LASER APPLICATION;  Surgeon: [name], MD;  Location: [facility];  Service: Urology;  Laterality: Left;   JOINT REPLACEMENT     LEFT HEART CATH AND CORONARY ANGIOGRAPHY N/A 05/15/2018   Evidence for spasm in distal LAD.  Mod RCA atherosclerosis, o/w no CAD.  Procedure: LEFT HEART CATH AND CORONARY ANGIOGRAPHY;  Surgeon: Leonie Man, MD;  Location: [facility];  Service: Cardiovascular;  Laterality: N/A;   NEGATIVE SLEEP STUDY  04/01/2007   NEPHROLITHOTOMY Left 08/05/2013   Procedure: NEPHROLITHOTOMY PERCUTANEOUS;  Surgeon: [name], MD;  Location: [facility];  Service: Urology;  Laterality: Left;   POLYSOMNOGRAM  01/2019   NORMAL   POLYSOMNOGRAPHY  10/25/2018   Dr. Dion Saucier due to pt inadequat sleep time (83 min).   TONSILLECTOMY AND ADENOIDECTOMY  1975   TOTAL KNEE ARTHROPLASTY Right 2003   TOTAL KNEE REVISION Right 01/19/2022   Procedure: Right knee polyethylene revision;  Surgeon: Gaynelle Arabian, MD;  Location: [facility];  Service: Orthopedics;  Laterality: Right;   TRANSTHORACIC ECHOCARDIOGRAM  05/2018; 12/03/2019   (after MI secondary to arterial spasm): EF  45-50%; severe hypokinesis of apical anterolateral, inferolateral , anterior, and inferior LV myocardium. 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   Shortness of breath 04/19/2019   Healthcare maintenance 04/19/2019   Overweight (BMI 25.0-29.9) 02/19/2019   Ischemic cardiomyopathy 09/03/2018   Chest pain 07/11/2018   Coronary vasospasm ([place]) 07/11/2018   NSTEMI (non-ST elevated myocardial infarction) ([place]) 05/14/2018   Acute encephalopathy 11/19/2017   Hyponatremia 11/16/2017   Bipolar 1 disorder ([place]) 12/29/2016   Hypertension 12/29/2016   TIA (transient ischemic attack) 12/29/2016   Chronic pelvic pain in female 08/22/2016   Stage 2 chronic kidney disease 08/22/2016   Osteoporosis 09/30/2015   Health maintenance examination 06/11/2015   Suprapubic discomfort 06/05/2015   Urethral caruncle 06/05/2015   Infection of urinary tract 06/05/2015   TMJ (temporomandibular joint disorder) 02/25/2015   Right ankle injury 11/05/2014   Cervical spondylosis without myelopathy 12/19/2013   Sprain of neck 12/19/2013   Memory difficulties 12/19/2013   Postnasal drip 10/28/2013   Renal calculus 08/05/2013   Urinary frequency 08/01/2013   Constipation, chronic 07/29/2013   COPD (chronic obstructive pulmonary disease) ([place]) 06/30/2013   Abnormal chest x-ray 06/30/2013   Benign essential HTN 05/29/2013   Kidney stone on left side 05/29/2013   Hyperlipidemia 05/29/2013    PCP: Shawnie Dapper  REFERRING PROVIDER: Jearld Lesch  REFERRING DIAG: presence of Right artificial knee joint  THERAPY DIAG:  Acute pain of right knee  Stiffness of right knee, not elsewhere classified  Other abnormalities of gait and mobility  Stiffness of left foot, not elsewhere  classified  Rationale for Evaluation and Treatment: Rehabilitation  ONSET DATE: 01/19/22  SUBJECTIVE:   SUBJECTIVE STATEMENT:  02/03/2022 Pt with no new complaints. Had dressing taken off, no steri strips in place.   Eval: Pt had R total knee revision surgery 10/18, discharged 10/19. Doing well, has been using RW. She is having a hard time with transportation to appointments. F/U with MD on 10/30.    PERTINENT HISTORY: Bipolar, TIA, Osteoporosis, CKD ,COPD,    PAIN:  Are you having pain? Yes:  NPRS scale: 2-3/10 Pain location: R knee Pain description: Sore, Tight,  Aggravating factors: Movement, bending, walking  Relieving factors: Rest, ice    PRECAUTIONS: Knee  WEIGHT BEARING RESTRICTIONS: No  FALLS:  Has patient fallen in last 6 months? No  PLOF: Independent  PATIENT GOALS:  Decreased pain, regain use of knee, walking.    OBJECTIVE:   DIAGNOSTIC FINDINGS:   COGNITION: Overall cognitive status: Within functional limits for tasks assessed     SENSATION: WFL  EDEMA:  Mild edema in R knee and lower leg   POSTURE: standing with R knee in flexion   PALPATION:  healing incision on R knee, covered with dressing.    LOWER EXTREMITY ROM:  Active ROM Right eval R 02/03/22  Hip flexion    Hip extension    Hip abduction    Hip adduction    Hip internal rotation    Hip external rotation    Knee flexion 105 113  Knee extension -5 -2  Ankle dorsiflexion    Ankle plantarflexion    Ankle inversion    Ankle eversion     (Blank rows = not tested)  LOWER EXTREMITY MMT:  MMT Right eval Left eval  Hip flexion    Hip extension    Hip abduction    Hip adduction    Hip internal rotation    Hip external rotation    Knee flexion 4   Knee extension 4-   Ankle dorsiflexion    Ankle plantarflexion    Ankle inversion    Ankle eversion     (Blank rows = not tested)  LOWER EXTREMITY SPECIAL TESTS:    GAIT: Distance walked: 100 Assistive device  utilized: Walker - 2 wheeled Level of assistance: Complete Independence Comments: increased knee flexion in stance phase    TODAY'S TREATMENT:                                                                                                                              DATE:   02/03/2022 Therapeutic Exercise: Aerobic: Supine: Heel slides x 20;  With strap x 10;  SLR 2 x 10 bil;  Quad sets x 20 on R; Ankle pumps x 20; SAQ x 20 on R:  Seated: LAQ x 20 on R;    Standing: L/R weight shifts x 20 for practice for TKE on R; Marching x 20;  Ambulation: 45 ft x 8, with SPC , cuing for TKE  Stretches: Seated HSS 30 sec x 5 on R:  Neuromuscular Re-education: Manual Therapy: Gait:    PATIENT EDUCATION:  Education details: PT POC, Exam findings, HEP Person educated: Patient Education method: Explanation, Demonstration, Tactile cues, Verbal cues, and Handouts Education comprehension: verbalized understanding, returned demonstration, verbal cues required, tactile cues required, and needs further education  HOME EXERCISE PROGRAM: Access Code: [email or web address]   ASSESSMENT:  CLINICAL IMPRESSION:  02/03/2022  Pt with good progress this week, with improving ROM for flexion and  extension. More difficult for TKE in standing. She has good ability for safe ambulation with SPC today, will start to use for short distances. Plan to progress as tolerated.    Eval: Patient presents with deficits from R total knee revision on 01/19/22. She has decreased ROM, and mild joint stiffness for flexion and extension. She has decreased strength in quad and LE. She has decreased ability for gait, stairs, and functional activities due to pain and deficit. Pt to benefit from skilled PT to improve deficits and return to PLOF.   OBJECTIVE IMPAIRMENTS: Abnormal gait, decreased activity tolerance, decreased balance, decreased knowledge of use of DME, decreased mobility, difficulty walking, decreased ROM, decreased strength, decreased  safety awareness, hypomobility, increased muscle spasms, impaired flexibility, and pain.   ACTIVITY LIMITATIONS: lifting, bending, sitting, standing, squatting, stairs, transfers, bed mobility, bathing, and locomotion level  PARTICIPATION LIMITATIONS: meal prep, cleaning, laundry, driving, shopping, and community activity  PERSONAL FACTORS:  none  are also affecting patient's functional outcome.   REHAB POTENTIAL: Good  CLINICAL DECISION MAKING: Stable/uncomplicated  EVALUATION COMPLEXITY: Low   GOALS: Goals reviewed with patient? Yes  SHORT TERM GOALS: Target date: 02/11/2022   Pt to be independent with initial HEP  Goal status: INITIAL  2.  Pt to demo improved Flexion ROM by at least 10 deg, and at least 3 deg of extension  in R knee.    Goal status: INITIAL    LONG TERM GOALS: Target date: 03/25/2022    Pt to be independent with final HEP  Goal status: INITIAL   2.  Pt to demo full ROM of R knee, to improve ability for ambulation and IADLs.   Goal status: INITIAL  3.  Pt to demo improved strength of R LE to at least 4+/5 to improve ability for walking, stairs, and IADLs.   Goal status: INITIAL  4.  Pt to demo ability for independent ambulation with mechanics WNL for pt age, with LRAD, for community distances.   Goal status: INITIAL  5.  Pt to demo ability for stair climbing, at least 5 steps, independently with 1 hand rail, to improve ability for community navigation.   Goal status: INITIAL    PLAN:  PT FREQUENCY: 2x/week  PT DURATION: 8 weeks  PLANNED INTERVENTIONS: Therapeutic exercises, Therapeutic activity, Neuromuscular re-education, Balance training, Gait training, Patient/Family education, Self Care, Joint mobilization, Joint manipulation, Stair training, Orthotic/Fit training, DME instructions, Dry Needling, Electrical stimulation, Spinal manipulation, Spinal mobilization, Cryotherapy, Moist heat, Taping, Vasopneumatic device, Traction,  Ultrasound, Ionotophoresis '4mg'$ /ml Dexamethasone, and Manual therapy  PLAN FOR NEXT SESSION:    Lyndee Hensen, PT, DPT 12:59 PM  02/03/22

## 2022-02-04 ENCOUNTER — Other Ambulatory Visit: Payer: Self-pay | Admitting: Pulmonary Disease

## 2022-02-04 ENCOUNTER — Other Ambulatory Visit: Payer: Self-pay | Admitting: Family Medicine

## 2022-02-07 ENCOUNTER — Ambulatory Visit: Payer: Medicare Other | Admitting: Physical Therapy

## 2022-02-07 ENCOUNTER — Encounter: Payer: Self-pay | Admitting: Physical Therapy

## 2022-02-07 DIAGNOSIS — M25661 Stiffness of right knee, not elsewhere classified: Secondary | ICD-10-CM

## 2022-02-07 DIAGNOSIS — R2689 Other abnormalities of gait and mobility: Secondary | ICD-10-CM | POA: Diagnosis not present

## 2022-02-07 DIAGNOSIS — M25561 Pain in right knee: Secondary | ICD-10-CM

## 2022-02-07 NOTE — Therapy (Signed)
OUTPATIENT PHYSICAL THERAPY LOWER EXTREMITY TREATMENT    Patient Name: Erin Lopez MRN: [id number] DOB:02/17/1951, 71 y.o., female Today's Date: 02/07/2022   PT End of Session - 02/07/22 1257     Visit Number 4    Number of Visits 16    Date for PT Re-Evaluation 03/25/22    Authorization Type UHC- MCR    PT Start Time 1300    PT Stop Time 1345    PT Time Calculation (min) 45 min    Activity Tolerance Patient tolerated treatment well    Behavior During Therapy WFL for tasks assessed/performed             Past Medical History:  Diagnosis Date   Alcoholism in remission ([place])    states last alcohol 11 yrs ago   Anemia    Anxiety    Bipolar 1 disorder ([place])    Admission for mania x 2 (most recent 11/2017)   Cancer ([place])    Cervical spondylosis without myelopathy 12/19/2013   MRI 02/2014: multilevel DDD/spondylosis, not much change compared to prior MRI.  Pt not in favor of invasive therapy for her neck as of 04/2014.   Cholelithiasis    COPD (chronic obstructive pulmonary disease) (HCC)    Moderate: anoro started 04/2017 by pulm, pt symptomatically improved and PFTs stable at f/u 06/2017.   Coronary artery spasm ([place]) 07/11/2018   nitrates=HA. Amlodipine=intol swelling.  Changed to coreg 09/03/18 by cardiologist.   Depression    Dilutional hyponatremia    Endometritis 05/2017   possible; empiric tx with Flagyl by GYN.   Environmental allergies    Fibromyalgia    GERD (gastroesophageal reflux disease)    Hepatic steatosis 03/2019   u/s abd   Herpes zoster 08/07/2017   L scapular region   Hiatal hernia    History of adenomatous polyp of colon 04/2008; 08/2014   No high grade dysplasia: recall 5 yrs ([name], [name])   History of basal cell carcinoma excision    NOSE   History of Helicobacter pylori infection 04/2008   +gastric biopsy (gastritis but no metaplasia, dysplasia, or malignancy identified)   History of kidney stones    History of TIA (transient  ischemic attack)    secondary to HELLP syndrome 1994 post c/s--  residual memory loss   Hyperlipidemia    statin started after her NSTEMI: goal  LDL <70.   Hypertension    Internal hemorrhoids    Ischemic cardiomyopathy 05/2018   Echo EF 45-50% in context of NSTEMI from CA spasm. 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 04/2017.   Leg pain    ABIs 11/2019: normal    Memory loss    Abnl MRI brain and CT brain c/w chronic microvascular ischemia.  Repeat MRI brain 02/2014 stable (Dr. Mayra Reel with no plan to f/u with neuro as of 04/2014)   NSTEMI (non-ST elevated myocardial infarction) ([place]) 05/2018   Echo EF 45-50% in context of NSTEMI from CA spasm.// Myoview 8/21: EF 66, no ischemia, low risk   Osteoarthritis of left wrist    STT and 1st [place] jt   Osteoporosis 05/2010   2012 'penia; 06/2015 'porosis:  prolia.  DEXA 09/2017 T-score -2.2 femur neck. 11/2021 T score -2.2. Rpt 2 yrs  Pelvic floor dysfunction    [facility] ([name])   Pneumonia    Postmenopausal vaginal bleeding 05/2017   x 1 small episode; GYN attempted endo bx but unable to penetrate cervix due to severe cerv stenosis (due to postmenopausal state + hx of LEEP).  Endo u/s showed thin endo lining.  Per GYN---no evidence of endomet pathology on w/u---obs as of 07/2017.   Pre-diabetes    Prediabetes 06/2016   Fasting gluc 105; HbA1c at that time was 6.0%.  A1c 6.2% 12/2017.  A1c 5.7% Feb 2023.   Recurrent kidney stones 08/2013   Left ureteral calculus: Perc nephr--cystoscopy w/ureteroscopy + laser for stone removal.  Residual asymptomatic left renal nephrolithiasis <71m present post-procedure.  Right sided hydronephrosis-persistent (on u/s)--urol ordered CT to further eval  08/2016: 1.6 cm nonobstructing stone lower pole left kidney, no hydro, plan for PCN extraction ([facility])   Shortness of breath    Sprain of neck 12/19/2013   Stroke (HCC)    TIA   TMJ (temporomandibular joint disorder)    USES  MOUTH GUARD   Toe fracture, left 12/2017   2nd toe prox phalanx (immobilization, Dr. RZadie Rhine   Tubular adenoma of colon    Vitamin D deficiency 05/2011   Weak urinary stream 07/2016   with elevated PVR and mild left hydronephrosis secondary to this ([facility] started flomax 0.457mqd for this).   Past Surgical History:  Procedure Laterality Date   ABIs  11/29/2019   NORMAL   ANTERIOR CERVICAL DECOMP/DISCECTOMY FUSION  06/30/2009   C3 -- C5  AND EXPLORATION OF FUSION C5-7 W/  PLATE REMOVAL   ARTHRODESIS METATARSALPHALANGEAL JOINT (MTPJ) Left 07/08/2021   Procedure: Left hallux metatarsophalangeal joint arthrodesis;  Surgeon: HeWylene SimmerMD;  [facility] Service: [facility]opedics;  Laterality: Left;   BACK SURGERY     BUNIONECTOMY Left    CARDIOVASCULAR STRESS TEST  12/03/2019   myoc perf im: NORMAL   CERVICAL FUSION  2002   anterior C5 -- C7   CERVICAL SPINE SURGERY  08/27/1999     C6 -- T1  LAMINECTOMY/  DISKECTOMY   CESAREAN SECTION  1994   CHOLECYSTECTOMY N/A 03/06/2020   Procedure: LAPAROSCOPIC CHOLECYSTECTOMY;  Surgeon: StFelicie MornMD;  Location: [facility] Service: General;  Laterality: N/A;   COLONOSCOPY W/ POLYPECTOMY  04/2008;08/2014   2016 tubular adenoma x 1; +diverticulosis and int/ext hemorrhoids.  06/2020 adenoma, recall 5 yrs.   CYSTOSCOPY WITH URETEROSCOPY AND STENT PLACEMENT Left 08/28/2013   Procedure: CYSTOSCOPY, RGP,  WITH URETEROSCOPY AND STENT REMOVAL;  Surgeon: DaBernestine AmassMD;  [facility];  Laterality: Left;   DEXA  09/2017   T-score -2.2 femur neck: improved compared to 06/2015.  2021 DEXA T score -2.3.  11/2021 DEXA T score -2.2.    ESOPHAGOGASTRODUODENOSCOPY  2010; 08/2014   2016 +Candidal esophagitis; Mild chronic gastritis w/intestinal metaplasia--NEG H pylori, neg for eosinophilic esoph. 3/4/4315soph candidiasis (diflucan x 20d), o/w normal.   HAMMER TOE SURGERY Left 07/08/2021   Procedure: Second metatarsal head excision; revision Second hammertoe correction; 2-5 percutaneus flexor tenotomies;  Surgeon: HeWylene SimmerMD;  [facility]: [facility];  Laterality: Left;   HARDWARE REMOVAL Left 07/08/2021   Procedure: Removal of deep implants from hallux proximal phalanx and First and Second metatarsals;  Surgeon: HeWylene SimmerMD;  [facility] Service: Orth[facility]ics;  Laterality: Left;   HOLMIUM LASER  APPLICATION Left 81/82/9937   Procedure: HOLMIUM LASER APPLICATION;  Surgeon: [name], MD;  [facility];  Service: Urology;  Laterality: Left;   JOINT REPLACEMENT     LEFT HEART CATH AND CORONARY ANGIOGRAPHY N/A 05/15/2018   Evidence for spasm in distal LAD.  Mod RCA atherosclerosis, o/w no CAD.  Procedure: LEFT HEART CATH AND CORONARY ANGIOGRAPHY;  Surgeon: Leonie Man, MD;  Location: [facility];  Service: Cardiovascular;  Laterality: N/A;   NEGATIVE SLEEP STUDY  04/01/2007   NEPHROLITHOTOMY Left 08/05/2013   Procedure: NEPHROLITHOTOMY PERCUTANEOUS;  Surgeon: [name], MD;  Location: [facility];  Service: Urology;  Laterality: Left;   POLYSOMNOGRAM  01/2019   NORMAL   POLYSOMNOGRAPHY  10/25/2018   Dr. Dion Saucier due to pt inadequat sleep time (83 min).   TONSILLECTOMY AND ADENOIDECTOMY  1975   TOTAL KNEE ARTHROPLASTY Right 2003   TOTAL KNEE REVISION Right 01/19/2022   Procedure: Right knee polyethylene revision;  Surgeon: Gaynelle Arabian, MD;  Location: [facility];  Service: Orthopedics;  Laterality: Right;   TRANSTHORACIC ECHOCARDIOGRAM  05/2018; 12/03/2019   (after MI secondary to arterial spasm): EF  45-50%; severe hypokinesis of apical anterolateral, inferolateral , anterior, and inferior LV myocardium. 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   Shortness of breath 04/19/2019   Healthcare maintenance 04/19/2019   Overweight (BMI 25.0-29.9) 02/19/2019   Ischemic cardiomyopathy 09/03/2018   Chest pain 07/11/2018   Coronary vasospasm ([place]) 07/11/2018   NSTEMI (non-ST elevated myocardial infarction) ([place]) 05/14/2018   Acute encephalopathy 11/19/2017   Hyponatremia 11/16/2017   Bipolar 1 disorder ([place]) 12/29/2016   Hypertension 12/29/2016   TIA (transient ischemic attack) 12/29/2016   Chronic pelvic pain in female 08/22/2016   Stage 2 chronic kidney disease 08/22/2016   Osteoporosis 09/30/2015   Health maintenance examination 06/11/2015   Suprapubic discomfort 06/05/2015   Urethral caruncle 06/05/2015   Infection of urinary tract 06/05/2015   TMJ (temporomandibular joint disorder) 02/25/2015   Right ankle injury 11/05/2014   Cervical spondylosis without myelopathy 12/19/2013   Sprain of neck 12/19/2013   Memory difficulties 12/19/2013   Postnasal drip 10/28/2013   Renal calculus 08/05/2013   Urinary frequency 08/01/2013   Constipation, chronic 07/29/2013   COPD (chronic obstructive pulmonary disease) ([place]) 06/30/2013   Abnormal chest x-ray 06/30/2013   Benign essential HTN 05/29/2013   Kidney stone on left side 05/29/2013   Hyperlipidemia 05/29/2013    PCP: Shawnie Dapper  REFERRING PROVIDER: Jearld Lesch  REFERRING DIAG: presence of Right artificial knee joint  THERAPY DIAG:  Acute pain of right knee  Stiffness of right knee, not elsewhere classified  Other abnormalities of gait and mobility  Rationale for Evaluation and Treatment:  Rehabilitation  ONSET DATE: 01/19/22  SUBJECTIVE:   SUBJECTIVE STATEMENT:  02/07/2022 Pt using SPC today. Has been using for a few days.   Eval: Pt had R total knee revision surgery 10/18, discharged 10/19. Doing well, has been using RW. She is having a hard time with transportation to appointments. F/U with MD on 10/30.    PERTINENT HISTORY: Bipolar, TIA, Osteoporosis, CKD ,COPD,    PAIN:  Are you having pain? Yes: NPRS scale: 2-3/10 Pain location: R knee Pain description: Sore, Tight,  Aggravating factors: Movement, bending, walking  Relieving factors: Rest, ice    PRECAUTIONS: Knee  WEIGHT BEARING RESTRICTIONS: No  FALLS:  Has patient fallen in last 6 months? No  PLOF: Independent  PATIENT GOALS:  Decreased pain, regain use of knee, walking.    OBJECTIVE:   DIAGNOSTIC FINDINGS:   COGNITION: Overall cognitive status: Within functional limits for tasks assessed     SENSATION: WFL  EDEMA:  Mild edema in R knee and lower leg   POSTURE: standing with R knee in flexion   PALPATION:  healing incision on R knee, covered with dressing.    LOWER EXTREMITY ROM:  Active ROM Right eval R 02/03/22  Hip flexion    Hip extension    Hip abduction    Hip adduction    Hip internal rotation    Hip external rotation    Knee flexion 105 113  Knee extension -5 -2  Ankle dorsiflexion    Ankle plantarflexion    Ankle inversion    Ankle eversion     (Blank rows = not tested)  LOWER EXTREMITY MMT:  MMT Right eval Left eval  Hip flexion    Hip extension    Hip abduction    Hip adduction    Hip internal rotation    Hip external rotation    Knee flexion 4   Knee extension 4-   Ankle dorsiflexion    Ankle plantarflexion    Ankle inversion    Ankle eversion     (Blank rows = not tested)  LOWER EXTREMITY SPECIAL TESTS:    GAIT: Distance walked: 100 Assistive device utilized: Environmental consultant - 2 wheeled Level of assistance: Complete Independence Comments:  increased knee flexion in stance phase    TODAY'S TREATMENT:                                                                                                                              DATE:   02/07/2022 Therapeutic Exercise: Aerobic: Recumbent bike L1 x 6 min  Supine: Heel slides x 10  SLR 2 x 10 bil;  Quad sets x 20 on R; Ankle pumps x 20;  Seated: LAQ x 20 on R;   Sit to stand x 5 from higher mat table;  Standing: L/R weight shifts x 20 for practice for TKE on R; pre-gait fwd weight shift onto R LE- with TKE x 20;   Ambulation: 45 ft x 8, with SPC , cuing for TKE  Stretches: Seated HSS 30 sec x 5 on R;  standing gastroc stretches at wall 30 sec x 3;  Neuromuscular Re-education: Manual Therapy: PROM for knee extension and flexion  Gait:    PATIENT EDUCATION:  Education details: Reviewed HEP Person educated: Patient Education method: Explanation, Demonstration, Tactile cues, Verbal cues, and Handouts Education comprehension: verbalized understanding, returned demonstration, verbal cues required, tactile cues required, and needs further education  HOME EXERCISE PROGRAM: Access Code: [email or web address]   ASSESSMENT:  CLINICAL IMPRESSION:  02/07/2022  Flexion ROM improving well. She is lacking full TKE, discussed continued focus for this for HEP. More difficult in standing with ther ex and gait today.  Also added hamstring stretch for HEP, noted tightness and discomfort with this today in session, improved after practicing hamstring mobility. Pt to benefit from continued work on ROM as well as strengthening and improving gait pattern.   Eval: Patient presents with deficits from R total knee revision on 01/19/22. She has decreased ROM, and mild joint stiffness for flexion and extension. She has decreased strength in quad and LE. She has decreased ability for gait, stairs, and functional activities due to pain and deficit. Pt to benefit from skilled PT to improve deficits and return to PLOF.    OBJECTIVE IMPAIRMENTS: Abnormal gait, decreased activity tolerance, decreased balance, decreased knowledge of use of DME, decreased mobility, difficulty walking, decreased ROM, decreased strength, decreased safety awareness, hypomobility, increased muscle spasms, impaired flexibility, and pain.   ACTIVITY LIMITATIONS: lifting, bending, sitting, standing, squatting, stairs, transfers, bed mobility, bathing, and locomotion level  PARTICIPATION LIMITATIONS: meal prep, cleaning, laundry, driving, shopping, and community activity  PERSONAL FACTORS:  none  are also affecting patient's functional outcome.   REHAB POTENTIAL: Good  CLINICAL DECISION MAKING: Stable/uncomplicated  EVALUATION COMPLEXITY: Low   GOALS: Goals reviewed with patient? Yes  SHORT TERM GOALS: Target date: 02/11/2022   Pt to be independent with initial HEP  Goal status: MET  2.  Pt to demo improved Flexion ROM by at least 10 deg, and at least 3 deg of extension  in R knee.    Goal status: MET    LONG TERM GOALS: Target date: 03/25/2022    Pt to be independent with final HEP  Goal status: INITIAL   2.  Pt to demo full ROM of R knee, to improve ability for ambulation and IADLs.   Goal status: INITIAL  3.  Pt to demo improved strength of R LE to at least 4+/5 to improve ability for walking, stairs, and IADLs.   Goal status: INITIAL  4.  Pt to demo ability for independent ambulation with mechanics WNL for pt age, with LRAD, for community distances.   Goal status: INITIAL  5.  Pt to demo ability for stair climbing, at least 5 steps, independently with 1 hand rail, to improve ability for community navigation.   Goal status: INITIAL    PLAN:  PT FREQUENCY: 2x/week  PT DURATION: 8 weeks  PLANNED INTERVENTIONS: Therapeutic exercises, Therapeutic activity, Neuromuscular re-education, Balance training, Gait training, Patient/Family education, Self Care, Joint mobilization, Joint manipulation, Stair  training, Orthotic/Fit training, DME instructions, Dry Needling, Electrical stimulation, Spinal manipulation, Spinal mobilization, Cryotherapy, Moist heat, Taping, Vasopneumatic device, Traction, Ultrasound, Ionotophoresis 98m/ml Dexamethasone, and Manual therapy  PLAN FOR NEXT SESSION:    LLyndee Hensen PT, DPT 12:58 PM  02/07/22

## 2022-02-08 ENCOUNTER — Other Ambulatory Visit: Payer: Self-pay | Admitting: Family Medicine

## 2022-02-10 ENCOUNTER — Other Ambulatory Visit: Payer: Self-pay | Admitting: Family Medicine

## 2022-02-10 ENCOUNTER — Ambulatory Visit (INDEPENDENT_AMBULATORY_CARE_PROVIDER_SITE_OTHER): Payer: Medicare Other | Admitting: Physical Therapy

## 2022-02-10 ENCOUNTER — Encounter: Payer: Self-pay | Admitting: Physical Therapy

## 2022-02-10 DIAGNOSIS — M25661 Stiffness of right knee, not elsewhere classified: Secondary | ICD-10-CM | POA: Diagnosis not present

## 2022-02-10 DIAGNOSIS — M25561 Pain in right knee: Secondary | ICD-10-CM | POA: Diagnosis not present

## 2022-02-10 DIAGNOSIS — R2689 Other abnormalities of gait and mobility: Secondary | ICD-10-CM

## 2022-02-10 NOTE — Therapy (Signed)
OUTPATIENT PHYSICAL THERAPY LOWER EXTREMITY TREATMENT    Patient Name: Erin Lopez MRN: [id number] DOB:May 05, 1950, 71 y.o., female Today's Date: 02/10/2022   PT End of Session - 02/10/22 1311     Visit Number 5    Number of Visits 16    Date for PT Re-Evaluation 03/25/22    Authorization Type UHC- MCR    PT Start Time 0737    PT Stop Time 1345    PT Time Calculation (min) 39 min    Activity Tolerance Patient tolerated treatment well    Behavior During Therapy WFL for tasks assessed/performed             Past Medical History:  Diagnosis Date   Alcoholism in remission ([place])    states last alcohol 11 yrs ago   Anemia    Anxiety    Bipolar 1 disorder ([place])    Admission for mania x 2 (most recent 11/2017)   Cancer ([place])    Cervical spondylosis without myelopathy 12/19/2013   MRI 02/2014: multilevel DDD/spondylosis, not much change compared to prior MRI.  Pt not in favor of invasive therapy for her neck as of 04/2014.   Cholelithiasis    COPD (chronic obstructive pulmonary disease) (HCC)    Moderate: anoro started 04/2017 by pulm, pt symptomatically improved and PFTs stable at f/u 06/2017.   Coronary artery spasm ([place]) 07/11/2018   nitrates=HA. Amlodipine=intol swelling.  Changed to coreg 09/03/18 by cardiologist.   Depression    Dilutional hyponatremia    Endometritis 05/2017   possible; empiric tx with Flagyl by GYN.   Environmental allergies    Fibromyalgia    GERD (gastroesophageal reflux disease)    Hepatic steatosis 03/2019   u/s abd   Herpes zoster 08/07/2017   L scapular region   Hiatal hernia    History of adenomatous polyp of colon 04/2008; 08/2014   No high grade dysplasia: recall 5 yrs ([name], [name])   History of basal cell carcinoma excision    NOSE   History of Helicobacter pylori infection 04/2008   +gastric biopsy (gastritis but no metaplasia, dysplasia, or malignancy identified)   History of kidney stones    History of TIA (transient  ischemic attack)    secondary to HELLP syndrome 1994 post c/s--  residual memory loss   Hyperlipidemia    statin started after her NSTEMI: goal  LDL <70.   Hypertension    Internal hemorrhoids    Ischemic cardiomyopathy 05/2018   Echo EF 45-50% in context of NSTEMI from CA spasm. 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 04/2017.   Leg pain    ABIs 11/2019: normal    Memory loss    Abnl MRI brain and CT brain c/w chronic microvascular ischemia.  Repeat MRI brain 02/2014 stable (Dr. Mayra Reel with no plan to f/u with neuro as of 04/2014)   NSTEMI (non-ST elevated myocardial infarction) ([place]) 05/2018   Echo EF 45-50% in context of NSTEMI from CA spasm.// Myoview 8/21: EF 66, no ischemia, low risk   Osteoarthritis of left wrist    STT and 1st Erwin jt   Osteoporosis 05/2010   2012 'penia; 06/2015 'porosis:  prolia.  DEXA 09/2017 T-score -2.2 femur neck. 11/2021 T score -2.2. Rpt 2 yrs  Pelvic floor dysfunction    [facility] ([name])   Pneumonia    Postmenopausal vaginal bleeding 05/2017   x 1 small episode; GYN attempted endo bx but unable to penetrate cervix due to severe cerv stenosis (due to postmenopausal state + hx of LEEP).  Endo u/s showed thin endo lining.  Per GYN---no evidence of endomet pathology on w/u---obs as of 07/2017.   Pre-diabetes    Prediabetes 06/2016   Fasting gluc 105; HbA1c at that time was 6.0%.  A1c 6.2% 12/2017.  A1c 5.7% Feb 2023.   Recurrent kidney stones 08/2013   Left ureteral calculus: Perc nephr--cystoscopy w/ureteroscopy + laser for stone removal.  Residual asymptomatic left renal nephrolithiasis <71m present post-procedure.  Right sided hydronephrosis-persistent (on u/s)--urol ordered CT to further eval  08/2016: 1.6 cm nonobstructing stone lower pole left kidney, no hydro, plan for PCN extraction ([facility])   Shortness of breath    Sprain of neck 12/19/2013   Stroke (HCC)    TIA   TMJ (temporomandibular joint disorder)    USES  MOUTH GUARD   Toe fracture, left 12/2017   2nd toe prox phalanx (immobilization, Dr. RZadie Rhine   Tubular adenoma of colon    Vitamin D deficiency 05/2011   Weak urinary stream 07/2016   with elevated PVR and mild left hydronephrosis secondary to this ([facility] started flomax 0.457mqd for this).   Past Surgical History:  Procedure Laterality Date   ABIs  11/29/2019   NORMAL   ANTERIOR CERVICAL DECOMP/DISCECTOMY FUSION  06/30/2009   C3 -- C5  AND EXPLORATION OF FUSION C5-7 W/  PLATE REMOVAL   ARTHRODESIS METATARSALPHALANGEAL JOINT (MTPJ) Left 07/08/2021   Procedure: Left hallux metatarsophalangeal joint arthrodesis;  Surgeon: HeWylene SimmerMD;  [facility]: [facility]opedics;  Laterality: Left;   BACK SURGERY     BUNIONECTOMY Left    CARDIOVASCULAR STRESS TEST  12/03/2019   myoc perf im: NORMAL   CERVICAL FUSION  2002   anterior C5 -- C7   CERVICAL SPINE SURGERY  08/27/1999     C6 -- T1  LAMINECTOMY/  DISKECTOMY   CESAREAN SECTION  1994   CHOLECYSTECTOMY N/A 03/06/2020   Procedure: LAPAROSCOPIC CHOLECYSTECTOMY;  Surgeon: StFelicie MornMD;  Location: [facility] Service: General;  Laterality: N/A;   COLONOSCOPY W/ POLYPECTOMY  04/2008;08/2014   2016 tubular adenoma x 1; +diverticulosis and int/ext hemorrhoids.  06/2020 adenoma, recall 5 yrs.   CYSTOSCOPY WITH URETEROSCOPY AND STENT PLACEMENT Left 08/28/2013   Procedure: CYSTOSCOPY, RGP,  WITH URETEROSCOPY AND STENT REMOVAL;  Surgeon: DaBernestine AmassMD;  [facility];  Laterality: Left;   DEXA  09/2017   T-score -2.2 femur neck: improved compared to 06/2015.  2021 DEXA T score -2.3.  11/2021 DEXA T score -2.2.    ESOPHAGOGASTRODUODENOSCOPY  2010; 08/2014   2016 +Candidal esophagitis; Mild chronic gastritis w/intestinal metaplasia--NEG H pylori, neg for eosinophilic esoph. 3/4/4315soph candidiasis (diflucan x 20d), o/w normal.   HAMMER TOE SURGERY Left 07/08/2021   Procedure: Second metatarsal head excision; revision Second hammertoe correction; 2-5 percutaneus flexor tenotomies;  Surgeon: HeWylene SimmerMD;  [facility];  Laterality: Left;   HARDWARE REMOVAL Left 07/08/2021   Procedure: Removal of deep implants from hallux proximal phalanx and First and Second metatarsals;  Surgeon: HeWylene SimmerMD;  [facility]: [facility];  Laterality: Left;   HOLMIUM LASER  APPLICATION Left 61/60/7371   Procedure: HOLMIUM LASER APPLICATION;  Surgeon: [name], MD;  Location[facility];  Service: Urology;  Laterality: Left;   JOINT REPLACEMENT     LEFT HEART CATH AND CORONARY ANGIOGRAPHY N/A 05/15/2018   Evidence for spasm in distal LAD.  Mod RCA atherosclerosis, o/w no CAD.  Procedure: LEFT HEART CATH AND CORONARY ANGIOGRAPHY;  Surgeon: Leonie Man, MD;  Location: [place] [facility];  Service: Cardiovascular;  Laterality: N/A;   NEGATIVE SLEEP STUDY  04/01/2007   NEPHROLITHOTOMY Left 08/05/2013   Procedure: NEPHROLITHOTOMY PERCUTANEOUS;  Surgeon: [name], MD;  Location: [facility];  Service: Urology;  Laterality: Left;   POLYSOMNOGRAM  01/2019   NORMAL   POLYSOMNOGRAPHY  10/25/2018   Dr. Dion Saucier due to pt inadequat sleep time (83 min).   TONSILLECTOMY AND ADENOIDECTOMY  1975   TOTAL KNEE ARTHROPLASTY Right 2003   TOTAL KNEE REVISION Right 01/19/2022   Procedure: Right knee polyethylene revision;  Surgeon: Gaynelle Arabian, MD;  Location: [facility];  Service: Orthopedics;  Laterality: Right;   TRANSTHORACIC ECHOCARDIOGRAM  05/2018; 12/03/2019   (after MI secondary to arterial spasm): EF  45-50%; severe hypokinesis of apical anterolateral, inferolateral , anterior, and inferior LV myocardium. 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   Shortness of breath 04/19/2019   Healthcare maintenance 04/19/2019   Overweight (BMI 25.0-29.9) 02/19/2019   Ischemic cardiomyopathy 09/03/2018   Chest pain 07/11/2018   Coronary vasospasm ([place]) 07/11/2018   NSTEMI (non-ST elevated myocardial infarction) ([place]) 05/14/2018   Acute encephalopathy 11/19/2017   Hyponatremia 11/16/2017   Bipolar 1 disorder ([place]) 12/29/2016   Hypertension 12/29/2016   TIA (transient ischemic attack) 12/29/2016   Chronic pelvic pain in female 08/22/2016   Stage 2 chronic kidney disease 08/22/2016   Osteoporosis 09/30/2015   Health maintenance examination 06/11/2015   Suprapubic discomfort 06/05/2015   Urethral caruncle 06/05/2015   Infection of urinary tract 06/05/2015   TMJ (temporomandibular joint disorder) 02/25/2015   Right ankle injury 11/05/2014   Cervical spondylosis without myelopathy 12/19/2013   Sprain of neck 12/19/2013   Memory difficulties 12/19/2013   Postnasal drip 10/28/2013   Renal calculus 08/05/2013   Urinary frequency 08/01/2013   Constipation, chronic 07/29/2013   COPD (chronic obstructive pulmonary disease) ([place]) 06/30/2013   Abnormal chest x-ray 06/30/2013   Benign essential HTN 05/29/2013   Kidney stone on left side 05/29/2013   Hyperlipidemia 05/29/2013    PCP: Shawnie Dapper  REFERRING PROVIDER: Jearld Lesch  REFERRING DIAG: presence of Right artificial knee joint  THERAPY DIAG:  Acute pain of right knee  Stiffness of right knee, not elsewhere classified  Other abnormalities of gait and mobility  Rationale for Evaluation and Treatment:  Rehabilitation  ONSET DATE: 01/19/22  SUBJECTIVE:   SUBJECTIVE STATEMENT:  02/10/2022 Pt doing well using SPC.   Eval: Pt had R total knee revision surgery 10/18, discharged 10/19. Doing well, has been using RW. She is having a hard time with transportation to appointments. F/U with MD on 10/30.    PERTINENT HISTORY: Bipolar, TIA, Osteoporosis, CKD ,COPD,    PAIN:  Are you having pain? Yes: NPRS scale: 2-3/10 Pain location: R knee Pain description: Sore, Tight,  Aggravating factors: Movement, bending, walking  Relieving factors: Rest, ice    PRECAUTIONS: Knee  WEIGHT BEARING RESTRICTIONS: No  FALLS:  Has patient fallen in last 6 months? No  PLOF: Independent  PATIENT GOALS:  Decreased pain, regain use of knee, walking.    OBJECTIVE:   DIAGNOSTIC FINDINGS:   COGNITION: Overall cognitive status: Within functional limits for tasks assessed     SENSATION: WFL  EDEMA:  Mild edema in R knee and lower leg   POSTURE: standing with R knee in flexion   PALPATION:  healing incision on R knee, covered with dressing.    LOWER EXTREMITY ROM:  Active ROM Right eval R 02/03/22  Hip flexion    Hip extension    Hip abduction    Hip adduction    Hip internal rotation    Hip external rotation    Knee flexion 105 113  Knee extension -5 -2  Ankle dorsiflexion    Ankle plantarflexion    Ankle inversion    Ankle eversion     (Blank rows = not tested)  LOWER EXTREMITY MMT:  MMT Right eval Left eval  Hip flexion    Hip extension    Hip abduction    Hip adduction    Hip internal rotation    Hip external rotation    Knee flexion 4   Knee extension 4-   Ankle dorsiflexion    Ankle plantarflexion    Ankle inversion    Ankle eversion     (Blank rows = not tested)  LOWER EXTREMITY SPECIAL TESTS:    GAIT: Distance walked: 100 Assistive device utilized: Environmental consultant - 2 wheeled Level of assistance: Complete Independence Comments: increased knee flexion in  stance phase    TODAY'S TREATMENT:                                                                                                                              DATE:   02/10/2022 Therapeutic Exercise: Aerobic: Recumbent bike L1 x 8 min  Supine:  SLR 2 x 10 bil;  Quad sets x 20 on R;  Seated: LAQ 2lb x 20 on R;   Sit to stand x 5 from higher mat table;  Standing:  pre-gait fwd and bwd stepping/ weight shift onto R LE- with TKE x 20;  HSC x 15 on R;  TKE/quad set x 15 on R;  Ambulation: 45 ft x 8, no AD  Stretches: Seated HSS 30 sec x 5 on R;   Neuromuscular Re-education: Manual Therapy: PROM for knee extension and flexion  Gait:    PATIENT EDUCATION:  Education details: Reviewed HEP Person educated: Patient Education method: Explanation, Demonstration, Tactile cues, Verbal cues, and Handouts Education comprehension: verbalized understanding, returned demonstration, verbal cues required, tactile cues required, and needs further education  HOME EXERCISE PROGRAM: Access Code: [email or web address]   ASSESSMENT:  CLINICAL IMPRESSION:  02/10/2022  Flexion Pt progressing well. She has improving knee extension in supine position. Requires mod-max cuing in  standing for improving knee extension. Improved at end of session today after stretching and education/practice. Plan to continue focus on this.   Eval: Patient presents with deficits from R total knee revision on 01/19/22. She has decreased ROM, and mild joint stiffness for flexion and extension. She has decreased strength in quad and LE. She has decreased ability for gait, stairs, and functional activities due to pain and deficit. Pt to benefit from skilled PT to improve deficits and return to PLOF.   OBJECTIVE IMPAIRMENTS: Abnormal gait, decreased activity tolerance, decreased balance, decreased knowledge of use of DME, decreased mobility, difficulty walking, decreased ROM, decreased strength, decreased safety awareness, hypomobility, increased  muscle spasms, impaired flexibility, and pain.   ACTIVITY LIMITATIONS: lifting, bending, sitting, standing, squatting, stairs, transfers, bed mobility, bathing, and locomotion level  PARTICIPATION LIMITATIONS: meal prep, cleaning, laundry, driving, shopping, and community activity  PERSONAL FACTORS:  none  are also affecting patient's functional outcome.   REHAB POTENTIAL: Good  CLINICAL DECISION MAKING: Stable/uncomplicated  EVALUATION COMPLEXITY: Low   GOALS: Goals reviewed with patient? Yes  SHORT TERM GOALS: Target date: 02/11/2022   Pt to be independent with initial HEP  Goal status: MET  2.  Pt to demo improved Flexion ROM by at least 10 deg, and at least 3 deg of extension  in R knee.    Goal status: MET    LONG TERM GOALS: Target date: 03/25/2022    Pt to be independent with final HEP  Goal status: INITIAL   2.  Pt to demo full ROM of R knee, to improve ability for ambulation and IADLs.   Goal status: INITIAL  3.  Pt to demo improved strength of R LE to at least 4+/5 to improve ability for walking, stairs, and IADLs.   Goal status: INITIAL  4.  Pt to demo ability for independent ambulation with mechanics WNL for pt age, with LRAD, for community distances.   Goal status: INITIAL  5.  Pt to demo ability for stair climbing, at least 5 steps, independently with 1 hand rail, to improve ability for community navigation.   Goal status: INITIAL    PLAN:  PT FREQUENCY: 2x/week  PT DURATION: 8 weeks  PLANNED INTERVENTIONS: Therapeutic exercises, Therapeutic activity, Neuromuscular re-education, Balance training, Gait training, Patient/Family education, Self Care, Joint mobilization, Joint manipulation, Stair training, Orthotic/Fit training, DME instructions, Dry Needling, Electrical stimulation, Spinal manipulation, Spinal mobilization, Cryotherapy, Moist heat, Taping, Vasopneumatic device, Traction, Ultrasound, Ionotophoresis 22m/ml Dexamethasone, and  Manual therapy  PLAN FOR NEXT SESSION:    LLyndee Hensen PT, DPT 1:12 PM  02/10/22

## 2022-02-14 ENCOUNTER — Ambulatory Visit: Payer: Medicare Other | Admitting: Physical Therapy

## 2022-02-14 ENCOUNTER — Encounter: Payer: Self-pay | Admitting: Physical Therapy

## 2022-02-14 DIAGNOSIS — M25561 Pain in right knee: Secondary | ICD-10-CM | POA: Diagnosis not present

## 2022-02-14 DIAGNOSIS — R2689 Other abnormalities of gait and mobility: Secondary | ICD-10-CM

## 2022-02-14 DIAGNOSIS — M25661 Stiffness of right knee, not elsewhere classified: Secondary | ICD-10-CM

## 2022-02-14 NOTE — Therapy (Signed)
OUTPATIENT PHYSICAL THERAPY LOWER EXTREMITY TREATMENT    Patient Name: Erin Lopez MRN: [id number] DOB:06-Nov-1950, 71 y.o., female Today's Date: 02/14/2022   PT End of Session - 02/14/22 1309     Visit Number 6    Number of Visits 16    Date for PT Re-Evaluation 03/25/22    Authorization Type UHC- MCR    PT Start Time 6440    PT Stop Time 1345    PT Time Calculation (min) 42 min    Activity Tolerance Patient tolerated treatment well    Behavior During Therapy WFL for tasks assessed/performed             Past Medical History:  Diagnosis Date   Alcoholism in remission ([place])    states last alcohol 11 yrs ago   Anemia    Anxiety    Bipolar 1 disorder ([place])    Admission for mania x 2 (most recent 11/2017)   Cancer ([place])    Cervical spondylosis without myelopathy 12/19/2013   MRI 02/2014: multilevel DDD/spondylosis, not much change compared to prior MRI.  Pt not in favor of invasive therapy for her neck as of 04/2014.   Cholelithiasis    COPD (chronic obstructive pulmonary disease) (HCC)    Moderate: anoro started 04/2017 by pulm, pt symptomatically improved and PFTs stable at f/u 06/2017.   Coronary artery spasm ([place]) 07/11/2018   nitrates=HA. Amlodipine=intol swelling.  Changed to coreg 09/03/18 by cardiologist.   Depression    Dilutional hyponatremia    Endometritis 05/2017   possible; empiric tx with Flagyl by GYN.   Environmental allergies    Fibromyalgia    GERD (gastroesophageal reflux disease)    Hepatic steatosis 03/2019   u/s abd   Herpes zoster 08/07/2017   L scapular region   Hiatal hernia    History of adenomatous polyp of colon 04/2008; 08/2014   No high grade dysplasia: recall 5 yrs ([name], [name])   History of basal cell carcinoma excision    NOSE   History of Helicobacter pylori infection 04/2008   +gastric biopsy (gastritis but no metaplasia, dysplasia, or malignancy identified)   History of kidney stones    History of TIA (transient  ischemic attack)    secondary to HELLP syndrome 1994 post c/s--  residual memory loss   Hyperlipidemia    statin started after her NSTEMI: goal  LDL <70.   Hypertension    Internal hemorrhoids    Ischemic cardiomyopathy 05/2018   Echo EF 45-50% in context of NSTEMI from CA spasm. 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 04/2017.   Leg pain    ABIs 11/2019: normal    Memory loss    Abnl MRI brain and CT brain c/w chronic microvascular ischemia.  Repeat MRI brain 02/2014 stable (Dr. Mayra Reel with no plan to f/u with neuro as of 04/2014)   NSTEMI (non-ST elevated myocardial infarction) ([place]) 05/2018   Echo EF 45-50% in context of NSTEMI from CA spasm.// Myoview 8/21: EF 66, no ischemia, low risk   Osteoarthritis of left wrist    STT and 1st [place] jt   Osteoporosis 05/2010   2012 'penia; 06/2015 'porosis:  prolia.  DEXA 09/2017 T-score -2.2 femur neck. 11/2021 T score -2.2. Rpt 2 yrs  Pelvic floor dysfunction    [facility] ([name])   Pneumonia    Postmenopausal vaginal bleeding 05/2017   x 1 small episode; GYN attempted endo bx but unable to penetrate cervix due to severe cerv stenosis (due to postmenopausal state + hx of LEEP).  Endo u/s showed thin endo lining.  Per GYN---no evidence of endomet pathology on w/u---obs as of 07/2017.   Pre-diabetes    Prediabetes 06/2016   Fasting gluc 105; HbA1c at that time was 6.0%.  A1c 6.2% 12/2017.  A1c 5.7% Feb 2023.   Recurrent kidney stones 08/2013   Left ureteral calculus: Perc nephr--cystoscopy w/ureteroscopy + laser for stone removal.  Residual asymptomatic left renal nephrolithiasis <71m present post-procedure.  Right sided hydronephrosis-persistent (on u/s)--urol ordered CT to further eval  08/2016: 1.6 cm nonobstructing stone lower pole left kidney, no hydro, plan for PCN extraction ([facility])   Shortness of breath    Sprain of neck 12/19/2013   Stroke (HCC)    TIA   TMJ (temporomandibular joint disorder)    USES  MOUTH GUARD   Toe fracture, left 12/2017   2nd toe prox phalanx (immobilization, Dr. RZadie Rhine   Tubular adenoma of colon    Vitamin D deficiency 05/2011   Weak urinary stream 07/2016   with elevated PVR and mild left hydronephrosis secondary to this ([facility] started flomax 0.457mqd for this).   Past Surgical History:  Procedure Laterality Date   ABIs  11/29/2019   NORMAL   ANTERIOR CERVICAL DECOMP/DISCECTOMY FUSION  06/30/2009   C3 -- C5  AND EXPLORATION OF FUSION C5-7 W/  PLATE REMOVAL   ARTHRODESIS METATARSALPHALANGEAL JOINT (MTPJ) Left 07/08/2021   Procedure: Left hallux metatarsophalangeal joint arthrodesis;  Surgeon: HeWylene SimmerMD;  [facility]: [facility]opedics;  Laterality: Left;   BACK SURGERY     BUNIONECTOMY Left    CARDIOVASCULAR STRESS TEST  12/03/2019   myoc perf im: NORMAL   CERVICAL FUSION  2002   anterior C5 -- C7   CERVICAL SPINE SURGERY  08/27/1999     C6 -- T1  LAMINECTOMY/  DISKECTOMY   CESAREAN SECTION  1994   CHOLECYSTECTOMY N/A 03/06/2020   Procedure: LAPAROSCOPIC CHOLECYSTECTOMY;  Surgeon: StFelicie MornMD;  Location: [facility] Service: General;  Laterality: N/A;   COLONOSCOPY W/ POLYPECTOMY  04/2008;08/2014   2016 tubular adenoma x 1; +diverticulosis and int/ext hemorrhoids.  06/2020 adenoma, recall 5 yrs.   CYSTOSCOPY WITH URETEROSCOPY AND STENT PLACEMENT Left 08/28/2013   Procedure: CYSTOSCOPY, RGP,  WITH URETEROSCOPY AND STENT REMOVAL;  Surgeon: DaBernestine AmassMD;  [facility];  Laterality: Left;   DEXA  09/2017   T-score -2.2 femur neck: improved compared to 06/2015.  2021 DEXA T score -2.3.  11/2021 DEXA T score -2.2.    ESOPHAGOGASTRODUODENOSCOPY  2010; 08/2014   2016 +Candidal esophagitis; Mild chronic gastritis w/intestinal metaplasia--NEG H pylori, neg for eosinophilic esoph. 3/4/4315soph candidiasis (diflucan x 20d), o/w normal.   HAMMER TOE SURGERY Left 07/08/2021   Procedure: Second metatarsal head excision; revision Second hammertoe correction; 2-5 percutaneus flexor tenotomies;  Surgeon: HeWylene SimmerMD;  [facility] [facility];  Laterality: Left;   HARDWARE REMOVAL Left 07/08/2021   Procedure: Removal of deep implants from hallux proximal phalanx and First and Second metatarsals;  Surgeon: HeWylene SimmerMD;  [facility]: [facility]ics;  Laterality: Left;   HOLMIUM LASER  APPLICATION Left 32/95/1884   Procedure: HOLMIUM LASER APPLICATION;  Surgeon: [name], MD;  Location: [facility];  Service: Urology;  Laterality: Left;   JOINT REPLACEMENT     LEFT HEART CATH AND CORONARY ANGIOGRAPHY N/A 05/15/2018   Evidence for spasm in distal LAD.  Mod RCA atherosclerosis, o/w no CAD.  Procedure: LEFT HEART CATH AND CORONARY ANGIOGRAPHY;  Surgeon: Leonie Man, MD;  Location: [place] [facility];  Service: Cardiovascular;  Laterality: N/A;   NEGATIVE SLEEP STUDY  04/01/2007   NEPHROLITHOTOMY Left 08/05/2013   Procedure: NEPHROLITHOTOMY PERCUTANEOUS;  Surgeon: [name], MD;  Location: [facility];  Service: Urology;  Laterality: Left;   POLYSOMNOGRAM  01/2019   NORMAL   POLYSOMNOGRAPHY  10/25/2018   Dr. Dion Saucier due to pt inadequat sleep time (83 min).   TONSILLECTOMY AND ADENOIDECTOMY  1975   TOTAL KNEE ARTHROPLASTY Right 2003   TOTAL KNEE REVISION Right 01/19/2022   Procedure: Right knee polyethylene revision;  Surgeon: Gaynelle Arabian, MD;  Location: [facility];  Service: Orthopedics;  Laterality: Right;   TRANSTHORACIC ECHOCARDIOGRAM  05/2018; 12/03/2019   (after MI secondary to arterial spasm): EF  45-50%; severe hypokinesis of apical anterolateral, inferolateral , anterior, and inferior LV myocardium. 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   Shortness of breath 04/19/2019   Healthcare maintenance 04/19/2019   Overweight (BMI 25.0-29.9) 02/19/2019   Ischemic cardiomyopathy 09/03/2018   Chest pain 07/11/2018   Coronary vasospasm ([place]) 07/11/2018   NSTEMI (non-ST elevated myocardial infarction) ([place]) 05/14/2018   Acute encephalopathy 11/19/2017   Hyponatremia 11/16/2017   Bipolar 1 disorder ([place]) 12/29/2016   Hypertension 12/29/2016   TIA (transient ischemic attack) 12/29/2016   Chronic pelvic pain in female 08/22/2016   Stage 2 chronic kidney disease 08/22/2016   Osteoporosis 09/30/2015   Health maintenance examination 06/11/2015   Suprapubic discomfort 06/05/2015   Urethral caruncle 06/05/2015   Infection of urinary tract 06/05/2015   TMJ (temporomandibular joint disorder) 02/25/2015   Right ankle injury 11/05/2014   Cervical spondylosis without myelopathy 12/19/2013   Sprain of neck 12/19/2013   Memory difficulties 12/19/2013   Postnasal drip 10/28/2013   Renal calculus 08/05/2013   Urinary frequency 08/01/2013   Constipation, chronic 07/29/2013   COPD (chronic obstructive pulmonary disease) ([place]) 06/30/2013   Abnormal chest x-ray 06/30/2013   Benign essential HTN 05/29/2013   Kidney stone on left side 05/29/2013   Hyperlipidemia 05/29/2013    PCP: Shawnie Dapper  REFERRING PROVIDER: Jearld Lesch  REFERRING DIAG: presence of Right artificial knee joint  THERAPY DIAG:  Acute pain of right knee  Stiffness of right knee, not elsewhere classified  Other abnormalities of gait and mobility  Rationale for Evaluation and Treatment:  Rehabilitation  ONSET DATE: 01/19/22  SUBJECTIVE:   SUBJECTIVE STATEMENT:  02/14/2022 No new complaints, states soreness at medial knee.   Eval: Pt had R total knee revision surgery 10/18, discharged 10/19. Doing well, has been using RW. She is having a hard time with transportation to appointments. F/U with MD on 10/30.    PERTINENT HISTORY: Bipolar, TIA, Osteoporosis, CKD ,COPD,    PAIN:  Are you having pain? Yes: NPRS scale: 4-5 /10 Pain location: R knee Pain description: Sore, Tight,  Aggravating  factors: Movement, bending, walking  Relieving factors: Rest, ice    PRECAUTIONS: Knee  WEIGHT BEARING RESTRICTIONS: No  FALLS:  Has patient fallen in last 6 months? No  PLOF: Independent  PATIENT GOALS:  Decreased pain, regain use of knee, walking.    OBJECTIVE:   DIAGNOSTIC FINDINGS:   COGNITION: Overall cognitive status: Within functional limits for tasks assessed     SENSATION: WFL  EDEMA:  Mild edema in R knee and lower leg   POSTURE: standing with R knee in flexion   PALPATION:  healing incision on R knee, covered with dressing.    LOWER EXTREMITY ROM:  Active ROM Right eval R 02/03/22 R 02/14/22  Hip flexion     Hip extension     Hip abduction     Hip adduction     Hip internal rotation     Hip external rotation     Knee flexion 105 113   Knee extension -5 -2   Ankle dorsiflexion     Ankle plantarflexion     Ankle inversion     Ankle eversion      (Blank rows = not tested)  LOWER EXTREMITY MMT:  MMT Right eval Left eval  Hip flexion    Hip extension    Hip abduction    Hip adduction    Hip internal rotation    Hip external rotation    Knee flexion 4   Knee extension 4-   Ankle dorsiflexion    Ankle plantarflexion    Ankle inversion    Ankle eversion     (Blank rows = not tested)  LOWER EXTREMITY SPECIAL TESTS:    GAIT: Distance walked: 100 Assistive device utilized: Environmental consultant - 2 wheeled Level of assistance: Complete  Independence Comments: increased knee flexion in stance phase    TODAY'S TREATMENT:                                                                                                                              DATE:   02/14/22 Therapeutic Exercise: Aerobic: Recumbent bike L1 x 8 min  Supine:  SLR 2 x 10 bil;  Quad sets x 20 on R;  Prone HSC with hang 10 sec x 10;  Seated:  Standing:  Marching with TKE x 20;  TKE/quad set x 15 on R; Ambulation: 150 ft with cueing for heel strike and TKE  Stretches: Seated HSS 30 sec x 3 on R;  gastroc stretch at wall x 5 on R;  Neuromuscular Re-education: Manual Therapy: PROM for knee extension and flexion  Gait:   Previous: Therapeutic Exercise: Aerobic: Recumbent bike L1 x 8 min  Supine:  SLR 2 x 10 bil;  Quad sets x 20 on R;  Seated: LAQ 2lb x 20 on R;   Sit to stand x 5 from higher mat table;  Standing:  pre-gait fwd and bwd stepping/ weight shift onto R LE- with TKE x 20;  [place]  x 15 on R;  TKE/quad set x 15 on R;  Ambulation: 45 ft x 8, no AD  Stretches: Seated HSS 30 sec x 5 on R;   Neuromuscular Re-education: Manual Therapy: PROM for knee extension and flexion  Gait:    PATIENT EDUCATION:  Education details: Reviewed HEP Person educated: Patient Education method: Explanation, Demonstration, Tactile cues, Verbal cues, and Handouts Education comprehension: verbalized understanding, returned demonstration, verbal cues required, tactile cues required, and needs further education  HOME EXERCISE PROGRAM: Access Code: [email or web address]   ASSESSMENT:  CLINICAL IMPRESSION:  02/14/2022  Pt still with difficulty with Tke in standing and with gait. She has significant ankle stiffness, with mild toeing out, and limited DF. This makes DF and heel strike position difficult with gait. Review and recommendation for continued mobility of ankle for HEP. Plan to continue to work on standing, gait posture, as well as progressive strength and stairs next visit.    Eval: Patient presents with deficits from R total knee revision on 01/19/22. She has decreased ROM, and mild joint stiffness for flexion and extension. She has decreased strength in quad and LE. She has decreased ability for gait, stairs, and functional activities due to pain and deficit. Pt to benefit from skilled PT to improve deficits and return to PLOF.   OBJECTIVE IMPAIRMENTS: Abnormal gait, decreased activity tolerance, decreased balance, decreased knowledge of use of DME, decreased mobility, difficulty walking, decreased ROM, decreased strength, decreased safety awareness, hypomobility, increased muscle spasms, impaired flexibility, and pain.   ACTIVITY LIMITATIONS: lifting, bending, sitting, standing, squatting, stairs, transfers, bed mobility, bathing, and locomotion level  PARTICIPATION LIMITATIONS: meal prep, cleaning, laundry, driving, shopping, and community activity  PERSONAL FACTORS:  none  are also affecting patient's functional outcome.   REHAB POTENTIAL: Good  CLINICAL DECISION MAKING: Stable/uncomplicated  EVALUATION COMPLEXITY: Low   GOALS: Goals reviewed with patient? Yes  SHORT TERM GOALS: Target date: 02/11/2022   Pt to be independent with initial HEP  Goal status: MET  2.  Pt to demo improved Flexion ROM by at least 10 deg, and at least 3 deg of extension  in R knee.    Goal status: MET    LONG TERM GOALS: Target date: 03/25/2022    Pt to be independent with final HEP  Goal status: INITIAL   2.  Pt to demo full ROM of R knee, to improve ability for ambulation and IADLs.   Goal status: INITIAL  3.  Pt to demo improved strength of R LE to at least 4+/5 to improve ability for walking, stairs, and IADLs.   Goal status: INITIAL  4.  Pt to demo ability for independent ambulation with mechanics WNL for pt age, with LRAD, for community distances.   Goal status: INITIAL  5.  Pt to demo ability for stair climbing, at least 5 steps, independently  with 1 hand rail, to improve ability for community navigation.   Goal status: INITIAL    PLAN:  PT FREQUENCY: 2x/week  PT DURATION: 8 weeks  PLANNED INTERVENTIONS: Therapeutic exercises, Therapeutic activity, Neuromuscular re-education, Balance training, Gait training, Patient/Family education, Self Care, Joint mobilization, Joint manipulation, Stair training, Orthotic/Fit training, DME instructions, Dry Needling, Electrical stimulation, Spinal manipulation, Spinal mobilization, Cryotherapy, Moist heat, Taping, Vasopneumatic device, Traction, Ultrasound, Ionotophoresis 16m/ml Dexamethasone, and Manual therapy  PLAN FOR NEXT SESSION:    LLyndee Hensen PT, DPT 1:10 PM  02/14/22

## 2022-02-17 ENCOUNTER — Ambulatory Visit: Payer: Medicare Other | Admitting: Physical Therapy

## 2022-02-17 ENCOUNTER — Encounter: Payer: Self-pay | Admitting: Physical Therapy

## 2022-02-17 DIAGNOSIS — M25561 Pain in right knee: Secondary | ICD-10-CM | POA: Diagnosis not present

## 2022-02-17 DIAGNOSIS — R2689 Other abnormalities of gait and mobility: Secondary | ICD-10-CM | POA: Diagnosis not present

## 2022-02-17 DIAGNOSIS — M25661 Stiffness of right knee, not elsewhere classified: Secondary | ICD-10-CM | POA: Diagnosis not present

## 2022-02-17 NOTE — Therapy (Signed)
OUTPATIENT PHYSICAL THERAPY LOWER EXTREMITY TREATMENT    Patient Name: Erin Lopez MRN: [id number] DOB:December 02, 1950, 71 y.o., female Today's Date: 02/17/2022   PT End of Session - 02/17/22 1323     Visit Number 7    Number of Visits 16    Date for PT Re-Evaluation 03/25/22    Authorization Type UHC- MCR    PT Start Time 1306    PT Stop Time 1347    PT Time Calculation (min) 41 min    Activity Tolerance Patient tolerated treatment well    Behavior During Therapy WFL for tasks assessed/performed             Past Medical History:  Diagnosis Date   Alcoholism in remission ([place])    states last alcohol 11 yrs ago   Anemia    Anxiety    Bipolar 1 disorder ([place])    Admission for mania x 2 (most recent 11/2017)   Cancer ([place])    Cervical spondylosis without myelopathy 12/19/2013   MRI 02/2014: multilevel DDD/spondylosis, not much change compared to prior MRI.  Pt not in favor of invasive therapy for her neck as of 04/2014.   Cholelithiasis    COPD (chronic obstructive pulmonary disease) (HCC)    Moderate: anoro started 04/2017 by pulm, pt symptomatically improved and PFTs stable at f/u 06/2017.   Coronary artery spasm ([place]) 07/11/2018   nitrates=HA. Amlodipine=intol swelling.  Changed to coreg 09/03/18 by cardiologist.   Depression    Dilutional hyponatremia    Endometritis 05/2017   possible; empiric tx with Flagyl by GYN.   Environmental allergies    Fibromyalgia    GERD (gastroesophageal reflux disease)    Hepatic steatosis 03/2019   u/s abd   Herpes zoster 08/07/2017   L scapular region   Hiatal hernia    History of adenomatous polyp of colon 04/2008; 08/2014   No high grade dysplasia: recall 5 yrs ([name], [name])   History of basal cell carcinoma excision    NOSE   History of Helicobacter pylori infection 04/2008   +gastric biopsy (gastritis but no metaplasia, dysplasia, or malignancy identified)   History of kidney stones    History of TIA (transient  ischemic attack)    secondary to HELLP syndrome 1994 post c/s--  residual memory loss   Hyperlipidemia    statin started after her NSTEMI: goal  LDL <70.   Hypertension    Internal hemorrhoids    Ischemic cardiomyopathy 05/2018   Echo EF 45-50% in context of NSTEMI from CA spasm. 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 04/2017.   Leg pain    ABIs 11/2019: normal    Memory loss    Abnl MRI brain and CT brain c/w chronic microvascular ischemia.  Repeat MRI brain 02/2014 stable (Dr. Mayra Reel with no plan to f/u with neuro as of 04/2014)   NSTEMI (non-ST elevated myocardial infarction) ([place]) 05/2018   Echo EF 45-50% in context of NSTEMI from CA spasm.// Myoview 8/21: EF 66, no ischemia, low risk   Osteoarthritis of left wrist    STT and 1st [place] jt   Osteoporosis 05/2010   2012 'penia; 06/2015 'porosis:  prolia.  DEXA 09/2017 T-score -2.2 femur neck. 11/2021 T score -2.2. Rpt 2 yrs  Pelvic floor dysfunction    [facility] ([name])   Pneumonia    Postmenopausal vaginal bleeding 05/2017   x 1 small episode; GYN attempted endo bx but unable to penetrate cervix due to severe cerv stenosis (due to postmenopausal state + hx of LEEP).  Endo u/s showed thin endo lining.  Per GYN---no evidence of endomet pathology on w/u---obs as of 07/2017.   Pre-diabetes    Prediabetes 06/2016   Fasting gluc 105; HbA1c at that time was 6.0%.  A1c 6.2% 12/2017.  A1c 5.7% Feb 2023.   Recurrent kidney stones 08/2013   Left ureteral calculus: Perc nephr--cystoscopy w/ureteroscopy + laser for stone removal.  Residual asymptomatic left renal nephrolithiasis <71m present post-procedure.  Right sided hydronephrosis-persistent (on u/s)--urol ordered CT to further eval  08/2016: 1.6 cm nonobstructing stone lower pole left kidney, no hydro, plan for PCN extraction ([facility])   Shortness of breath    Sprain of neck 12/19/2013   Stroke (HCC)    TIA   TMJ (temporomandibular joint disorder)    USES  MOUTH GUARD   Toe fracture, left 12/2017   2nd toe prox phalanx (immobilization, Dr. RZadie Rhine   Tubular adenoma of colon    Vitamin D deficiency 05/2011   Weak urinary stream 07/2016   with elevated PVR and mild left hydronephrosis secondary to this ([facility] started flomax 0.457mqd for this).   Past Surgical History:  Procedure Laterality Date   ABIs  11/29/2019   NORMAL   ANTERIOR CERVICAL DECOMP/DISCECTOMY FUSION  06/30/2009   C3 -- C5  AND EXPLORATION OF FUSION C5-7 W/  PLATE REMOVAL   ARTHRODESIS METATARSALPHALANGEAL JOINT (MTPJ) Left 07/08/2021   Procedure: Left hallux metatarsophalangeal joint arthrodesis;  Surgeon: HeWylene SimmerMD;  [facility]: [facility]ics;  Laterality: Left;   BACK SURGERY     BUNIONECTOMY Left    CARDIOVASCULAR STRESS TEST  12/03/2019   myoc perf im: NORMAL   CERVICAL FUSION  2002   anterior C5 -- C7   CERVICAL SPINE SURGERY  08/27/1999     C6 -- T1  LAMINECTOMY/  DISKECTOMY   CESAREAN SECTION  1994   CHOLECYSTECTOMY N/A 03/06/2020   Procedure: LAPAROSCOPIC CHOLECYSTECTOMY;  Surgeon: StFelicie MornMD;  [facility] Service: General;  Laterality: N/A;   COLONOSCOPY W/ POLYPECTOMY  04/2008;08/2014   2016 tubular adenoma x 1; +diverticulosis and int/ext hemorrhoids.  06/2020 adenoma, recall 5 yrs.   CYSTOSCOPY WITH URETEROSCOPY AND STENT PLACEMENT Left 08/28/2013   Procedure: CYSTOSCOPY, RGP,  WITH URETEROSCOPY AND STENT REMOVAL;  Surgeon: DaBernestine AmassMD;  [facility];  Laterality: Left;   DEXA  09/2017   T-score -2.2 femur neck: improved compared to 06/2015.  2021 DEXA T score -2.3.  11/2021 DEXA T score -2.2.    ESOPHAGOGASTRODUODENOSCOPY  2010; 08/2014   2016 +Candidal esophagitis; Mild chronic gastritis w/intestinal metaplasia--NEG H pylori, neg for eosinophilic esoph. 3/4/4315soph candidiasis (diflucan x 20d), o/w normal.   HAMMER TOE SURGERY Left 07/08/2021   Procedure: Second metatarsal head excision; revision Second hammertoe correction; 2-5 percutaneus flexor tenotomies;  Surgeon: HeWylene SimmerMD;  [facility];  Laterality: Left;   HARDWARE REMOVAL Left 07/08/2021   Procedure: Removal of deep implants from hallux proximal phalanx and First and Second metatarsals;  Surgeon: HeWylene SimmerMD;  [facility]: [facility];  Laterality: Left;   HOLMIUM LASER  APPLICATION Left 50/93/2671   Procedure: HOLMIUM LASER APPLICATION;  Surgeon: [name], MD;  [facility];  Service: Urology;  Laterality: Left;   JOINT REPLACEMENT     LEFT HEART CATH AND CORONARY ANGIOGRAPHY N/A 05/15/2018   Evidence for spasm in distal LAD.  Mod RCA atherosclerosis, o/w no CAD.  Procedure: LEFT HEART CATH AND CORONARY ANGIOGRAPHY;  Surgeon: Leonie Man, MD;  Location: [place] [facility];  Service: Cardiovascular;  Laterality: N/A;   NEGATIVE SLEEP STUDY  04/01/2007   NEPHROLITHOTOMY Left 08/05/2013   Procedure: NEPHROLITHOTOMY PERCUTANEOUS;  Surgeon: [name], MD;  Location: [facility];  Service: Urology;  Laterality: Left;   POLYSOMNOGRAM  01/2019   NORMAL   POLYSOMNOGRAPHY  10/25/2018   Dr. Dion Saucier due to pt inadequat sleep time (83 min).   TONSILLECTOMY AND ADENOIDECTOMY  1975   TOTAL KNEE ARTHROPLASTY Right 2003   TOTAL KNEE REVISION Right 01/19/2022   Procedure: Right knee polyethylene revision;  Surgeon: Gaynelle Arabian, MD;  Location: [facility];  Service: Orthopedics;  Laterality: Right;   TRANSTHORACIC ECHOCARDIOGRAM  05/2018; 12/03/2019   (after MI secondary to arterial spasm): EF  45-50%; severe hypokinesis of apical anterolateral, inferolateral , anterior, and inferior LV myocardium. 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   Shortness of breath 04/19/2019   Healthcare maintenance 04/19/2019   Overweight (BMI 25.0-29.9) 02/19/2019   Ischemic cardiomyopathy 09/03/2018   Chest pain 07/11/2018   Coronary vasospasm ([place]) 07/11/2018   NSTEMI (non-ST elevated myocardial infarction) ([place]) 05/14/2018   Acute encephalopathy 11/19/2017   Hyponatremia 11/16/2017   Bipolar 1 disorder ([place]) 12/29/2016   Hypertension 12/29/2016   TIA (transient ischemic attack) 12/29/2016   Chronic pelvic pain in female 08/22/2016   Stage 2 chronic kidney disease 08/22/2016   Osteoporosis 09/30/2015   Health maintenance examination 06/11/2015   Suprapubic discomfort 06/05/2015   Urethral caruncle 06/05/2015   Infection of urinary tract 06/05/2015   TMJ (temporomandibular joint disorder) 02/25/2015   Right ankle injury 11/05/2014   Cervical spondylosis without myelopathy 12/19/2013   Sprain of neck 12/19/2013   Memory difficulties 12/19/2013   Postnasal drip 10/28/2013   Renal calculus 08/05/2013   Urinary frequency 08/01/2013   Constipation, chronic 07/29/2013   COPD (chronic obstructive pulmonary disease) ([place]) 06/30/2013   Abnormal chest x-ray 06/30/2013   Benign essential HTN 05/29/2013   Kidney stone on left side 05/29/2013   Hyperlipidemia 05/29/2013    PCP: Shawnie Dapper  REFERRING PROVIDER: Jearld Lesch  REFERRING DIAG: presence of Right artificial knee joint  THERAPY DIAG:  Acute pain of right knee  Stiffness of right knee, not elsewhere classified  Other abnormalities of gait and mobility  Rationale for Evaluation and Treatment:  Rehabilitation  ONSET DATE: 01/19/22  SUBJECTIVE:   SUBJECTIVE STATEMENT:  02/17/2022 No new complaints, states soreness at medial knee.   Eval: Pt had R total knee revision surgery 10/18, discharged 10/19. Doing well, has been using RW. She is having a hard time with transportation to appointments. F/U with MD on 10/30.    PERTINENT HISTORY: Bipolar, TIA, Osteoporosis, CKD ,COPD,    PAIN:  Are you having pain? Yes: NPRS scale: 4-5 /10 Pain location: R knee Pain description: Sore, Tight,  Aggravating  factors: Movement, bending, walking  Relieving factors: Rest, ice    PRECAUTIONS: Knee  WEIGHT BEARING RESTRICTIONS: No  FALLS:  Has patient fallen in last 6 months? No  PLOF: Independent  PATIENT GOALS:  Decreased pain, regain use of knee, walking.    OBJECTIVE:   DIAGNOSTIC FINDINGS:   COGNITION: Overall cognitive status: Within functional limits for tasks assessed     SENSATION: WFL  EDEMA:  Mild edema in R knee and lower leg   POSTURE: standing with R knee in flexion   PALPATION:  healing incision on R knee, covered with dressing.    LOWER EXTREMITY ROM:  Active ROM Right eval R 02/03/22 R 02/14/22  Hip flexion     Hip extension     Hip abduction     Hip adduction     Hip internal rotation     Hip external rotation     Knee flexion 105 113   Knee extension -5 -2   Ankle dorsiflexion     Ankle plantarflexion     Ankle inversion     Ankle eversion      (Blank rows = not tested)  LOWER EXTREMITY MMT:  MMT Right eval Left eval  Hip flexion    Hip extension    Hip abduction    Hip adduction    Hip internal rotation    Hip external rotation    Knee flexion 4   Knee extension 4-   Ankle dorsiflexion    Ankle plantarflexion    Ankle inversion    Ankle eversion     (Blank rows = not tested)  LOWER EXTREMITY SPECIAL TESTS:    GAIT: Distance walked: 100 Assistive device utilized: Environmental consultant - 2 wheeled Level of assistance: Complete  Independence Comments: increased knee flexion in stance phase    TODAY'S TREATMENT:                                                                                                                              DATE:   02/17/22 Therapeutic Exercise: Aerobic: Recumbent bike L1 x 8 min  Supine:  SLR 2 x 10 bil;  Quad sets x 20 on R; Heel slides x 15 on R;  Seated: LAQ 2 lb x 20 on R;  Standing:  Marching with TKE x 20; Step ups 4 and 6 in x 10 on R; Stairs, up/down 5 steps x 5 1 Hand rail;  Ambulation: 150 ft with cueing for heel strike and TKE  Stretches: Seated HSS 30 sec x 3 on R;  gastroc stretch at wall x 5 bil   Neuromuscular Re-education: Manual Therapy:  PROM for knee extension and flexion  Gait:   Previous: Therapeutic Exercise: Aerobic: Recumbent bike L1 x 8 min  Supine:  SLR 2 x 10 bil;  Quad sets x 20 on R;  Seated: LAQ 2lb x 20 on R;   Sit to stand x 5 from higher mat table;  Standing:  pre-gait fwd and bwd stepping/ weight shift onto R LE- with TKE x 20;  [place] x 15 on R;  TKE/quad set x 15 on R;  Ambulation: 45 ft x 8, no AD  Stretches: Seated HSS 30 sec x 5 on R;   Neuromuscular Re-education: Manual Therapy: PROM for knee extension and flexion  Gait:    PATIENT EDUCATION:  Education details: Reviewed HEP Person educated: Patient Education method: Explanation, Demonstration, Tactile cues, Verbal cues, and Handouts Education comprehension: verbalized understanding, returned demonstration, verbal cues required, tactile cues required, and needs further education  HOME EXERCISE PROGRAM: Access Code: [email or web address]   ASSESSMENT:  CLINICAL IMPRESSION:  02/17/2022  Pt with mild improvement/ability for TKE in standing today. Review of optimal mechanics and sequencing for stair climbing. She will benefit from continued work on step ups and standing functional strength.   Eval: Patient presents with deficits from R total knee revision on 01/19/22. She has decreased ROM, and  mild joint stiffness for flexion and extension. She has decreased strength in quad and LE. She has decreased ability for gait, stairs, and functional activities due to pain and deficit. Pt to benefit from skilled PT to improve deficits and return to PLOF.   OBJECTIVE IMPAIRMENTS: Abnormal gait, decreased activity tolerance, decreased balance, decreased knowledge of use of DME, decreased mobility, difficulty walking, decreased ROM, decreased strength, decreased safety awareness, hypomobility, increased muscle spasms, impaired flexibility, and pain.   ACTIVITY LIMITATIONS: lifting, bending, sitting, standing, squatting, stairs, transfers, bed mobility, bathing, and locomotion level  PARTICIPATION LIMITATIONS: meal prep, cleaning, laundry, driving, shopping, and community activity  PERSONAL FACTORS:  none  are also affecting patient's functional outcome.   REHAB POTENTIAL: Good  CLINICAL DECISION MAKING: Stable/uncomplicated  EVALUATION COMPLEXITY: Low   GOALS: Goals reviewed with patient? Yes  SHORT TERM GOALS: Target date: 02/11/2022   Pt to be independent with initial HEP  Goal status: MET  2.  Pt to demo improved Flexion ROM by at least 10 deg, and at least 3 deg of extension  in R knee.    Goal status: MET    LONG TERM GOALS: Target date: 03/25/2022    Pt to be independent with final HEP  Goal status: INITIAL   2.  Pt to demo full ROM of R knee, to improve ability for ambulation and IADLs.   Goal status: INITIAL  3.  Pt to demo improved strength of R LE to at least 4+/5 to improve ability for walking, stairs, and IADLs.   Goal status: INITIAL  4.  Pt to demo ability for independent ambulation with mechanics WNL for pt age, with LRAD, for community distances.   Goal status: INITIAL  5.  Pt to demo ability for stair climbing, at least 5 steps, independently with 1 hand rail, to improve ability for community navigation.   Goal status: INITIAL    PLAN:  PT  FREQUENCY: 2x/week  PT DURATION: 8 weeks  PLANNED INTERVENTIONS: Therapeutic exercises, Therapeutic activity, Neuromuscular re-education, Balance training, Gait training, Patient/Family education, Self Care, Joint mobilization, Joint manipulation, Stair training, Orthotic/Fit training, DME instructions, Dry Needling, Electrical stimulation, Spinal manipulation, Spinal mobilization, Cryotherapy, Moist heat, Taping, Vasopneumatic device, Traction, Ultrasound, Ionotophoresis 51m/ml Dexamethasone, and Manual therapy  PLAN FOR NEXT SESSION:    LLyndee Hensen PT, DPT 1:51 PM  02/17/22

## 2022-02-20 ENCOUNTER — Other Ambulatory Visit: Payer: Self-pay | Admitting: Family Medicine

## 2022-02-23 ENCOUNTER — Encounter: Payer: Self-pay | Admitting: Physical Therapy

## 2022-02-23 ENCOUNTER — Ambulatory Visit (INDEPENDENT_AMBULATORY_CARE_PROVIDER_SITE_OTHER): Payer: Medicare Other | Admitting: Physical Therapy

## 2022-02-23 DIAGNOSIS — R2689 Other abnormalities of gait and mobility: Secondary | ICD-10-CM | POA: Diagnosis not present

## 2022-02-23 DIAGNOSIS — M25661 Stiffness of right knee, not elsewhere classified: Secondary | ICD-10-CM

## 2022-02-23 DIAGNOSIS — M25561 Pain in right knee: Secondary | ICD-10-CM | POA: Diagnosis not present

## 2022-02-23 NOTE — Therapy (Signed)
OUTPATIENT PHYSICAL THERAPY LOWER EXTREMITY TREATMENT    Patient Name: Erin Lopez MRN: [id number] DOB:1950/12/26, 71 y.o., female Today's Date: 02/23/2022   PT End of Session - 02/23/22 1258     Visit Number 8    Number of Visits 16    Date for PT Re-Evaluation 03/25/22    Authorization Type UHC- MCR    PT Start Time 1300    PT Stop Time 1345    PT Time Calculation (min) 45 min    Activity Tolerance Patient tolerated treatment well    Behavior During Therapy WFL for tasks assessed/performed             Past Medical History:  Diagnosis Date   Alcoholism in remission ([place])    states last alcohol 11 yrs ago   Anemia    Anxiety    Bipolar 1 disorder ([place])    Admission for mania x 2 (most recent 11/2017)   Cancer ([place])    Cervical spondylosis without myelopathy 12/19/2013   MRI 02/2014: multilevel DDD/spondylosis, not much change compared to prior MRI.  Pt not in favor of invasive therapy for her neck as of 04/2014.   Cholelithiasis    COPD (chronic obstructive pulmonary disease) (HCC)    Moderate: anoro started 04/2017 by pulm, pt symptomatically improved and PFTs stable at f/u 06/2017.   Coronary artery spasm ([place]) 07/11/2018   nitrates=HA. Amlodipine=intol swelling.  Changed to coreg 09/03/18 by cardiologist.   Depression    Dilutional hyponatremia    Endometritis 05/2017   possible; empiric tx with Flagyl by GYN.   Environmental allergies    Fibromyalgia    GERD (gastroesophageal reflux disease)    Hepatic steatosis 03/2019   u/s abd   Herpes zoster 08/07/2017   L scapular region   Hiatal hernia    History of adenomatous polyp of colon 04/2008; 08/2014   No high grade dysplasia: recall 5 yrs ([name], [name])   History of basal cell carcinoma excision    NOSE   History of Helicobacter pylori infection 04/2008   +gastric biopsy (gastritis but no metaplasia, dysplasia, or malignancy identified)   History of kidney stones    History of TIA (transient  ischemic attack)    secondary to HELLP syndrome 1994 post c/s--  residual memory loss   Hyperlipidemia    statin started after her NSTEMI: goal  LDL <70.   Hypertension    Internal hemorrhoids    Ischemic cardiomyopathy 05/2018   Echo EF 45-50% in context of NSTEMI from CA spasm. 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 04/2017.   Leg pain    ABIs 11/2019: normal    Memory loss    Abnl MRI brain and CT brain c/w chronic microvascular ischemia.  Repeat MRI brain 02/2014 stable (Dr. Mayra Reel with no plan to f/u with neuro as of 04/2014)   NSTEMI (non-ST elevated myocardial infarction) (Post) 05/2018   Echo EF 45-50% in context of NSTEMI from CA spasm.// Myoview 8/21: EF 66, no ischemia, low risk   Osteoarthritis of left wrist    STT and 1st [place] jt   Osteoporosis 05/2010   2012 'penia; 06/2015 'porosis:  prolia.  DEXA 09/2017 T-score -2.2 femur neck. 11/2021 T score -2.2. Rpt 2 yrs  Pelvic floor dysfunction    [facility] ([name])   Pneumonia    Postmenopausal vaginal bleeding 05/2017   x 1 small episode; GYN attempted endo bx but unable to penetrate cervix due to severe cerv stenosis (due to postmenopausal state + hx of LEEP).  Endo u/s showed thin endo lining.  Per GYN---no evidence of endomet pathology on w/u---obs as of 07/2017.   Pre-diabetes    Prediabetes 06/2016   Fasting gluc 105; HbA1c at that time was 6.0%.  A1c 6.2% 12/2017.  A1c 5.7% Feb 2023.   Recurrent kidney stones 08/2013   Left ureteral calculus: Perc nephr--cystoscopy w/ureteroscopy + laser for stone removal.  Residual asymptomatic left renal nephrolithiasis <71m present post-procedure.  Right sided hydronephrosis-persistent (on u/s)--urol ordered CT to further eval  08/2016: 1.6 cm nonobstructing stone lower pole left kidney, no hydro, plan for PCN extraction ([facility])   Shortness of breath    Sprain of neck 12/19/2013   Stroke (HCC)    TIA   TMJ (temporomandibular joint disorder)    USES  MOUTH GUARD   Toe fracture, left 12/2017   2nd toe prox phalanx (immobilization, Dr. RZadie Rhine   Tubular adenoma of colon    Vitamin D deficiency 05/2011   Weak urinary stream 07/2016   with elevated PVR and mild left hydronephrosis secondary to this ([facility] started flomax 0.457mqd for this).   Past Surgical History:  Procedure Laterality Date   ABIs  11/29/2019   NORMAL   ANTERIOR CERVICAL DECOMP/DISCECTOMY FUSION  06/30/2009   C3 -- C5  AND EXPLORATION OF FUSION C5-7 W/  PLATE REMOVAL   ARTHRODESIS METATARSALPHALANGEAL JOINT (MTPJ) Left 07/08/2021   Procedure: Left hallux metatarsophalangeal joint arthrodesis;  Surgeon: HeWylene SimmerMD;  [facility]: [facility]opedics;  Laterality: Left;   BACK SURGERY     BUNIONECTOMY Left    CARDIOVASCULAR STRESS TEST  12/03/2019   myoc perf im: NORMAL   CERVICAL FUSION  2002   anterior C5 -- C7   CERVICAL SPINE SURGERY  08/27/1999     C6 -- T1  LAMINECTOMY/  DISKECTOMY   CESAREAN SECTION  1994   CHOLECYSTECTOMY N/A 03/06/2020   Procedure: LAPAROSCOPIC CHOLECYSTECTOMY;  Surgeon: StFelicie MornMD;  Location: [facility] Service: General;  Laterality: N/A;   COLONOSCOPY W/ POLYPECTOMY  04/2008;08/2014   2016 tubular adenoma x 1; +diverticulosis and int/ext hemorrhoids.  06/2020 adenoma, recall 5 yrs.   CYSTOSCOPY WITH URETEROSCOPY AND STENT PLACEMENT Left 08/28/2013   Procedure: CYSTOSCOPY, RGP,  WITH URETEROSCOPY AND STENT REMOVAL;  Surgeon: DaBernestine AmassMD;  [facility];  Laterality: Left;   DEXA  09/2017   T-score -2.2 femur neck: improved compared to 06/2015.  2021 DEXA T score -2.3.  11/2021 DEXA T score -2.2.    ESOPHAGOGASTRODUODENOSCOPY  2010; 08/2014   2016 +Candidal esophagitis; Mild chronic gastritis w/intestinal metaplasia--NEG H pylori, neg for eosinophilic esoph. 3/4/4315soph candidiasis (diflucan x 20d), o/w normal.   HAMMER TOE SURGERY Left 07/08/2021   Procedure: Second metatarsal head excision; revision Second hammertoe correction; 2-5 percutaneus flexor tenotomies;  Surgeon: HeWylene SimmerMD;  [facility];  Laterality: Left;   HARDWARE REMOVAL Left 07/08/2021   Procedure: Removal of deep implants from hallux proximal phalanx and First and Second metatarsals;  Surgeon: HeWylene SimmerMD;  [facility]: [facility];  Laterality: Left;   HOLMIUM LASER  APPLICATION Left 78/29/5621   Procedure: HOLMIUM LASER APPLICATION;  Surgeon: [name], MD;  [facility];  Service: Urology;  Laterality: Left;   JOINT REPLACEMENT     LEFT HEART CATH AND CORONARY ANGIOGRAPHY N/A 05/15/2018   Evidence for spasm in distal LAD.  Mod RCA atherosclerosis, o/w no CAD.  Procedure: LEFT HEART CATH AND CORONARY ANGIOGRAPHY;  Surgeon: Leonie Man, MD;  Location: [facility];  Service: Cardiovascular;  Laterality: N/A;   NEGATIVE SLEEP STUDY  04/01/2007   NEPHROLITHOTOMY Left 08/05/2013   Procedure: NEPHROLITHOTOMY PERCUTANEOUS;  Surgeon: [name], MD;  Location: [facility];  Service: Urology;  Laterality: Left;   POLYSOMNOGRAM  01/2019   NORMAL   POLYSOMNOGRAPHY  10/25/2018   Dr. Dion Saucier due to pt inadequat sleep time (83 min).   TONSILLECTOMY AND ADENOIDECTOMY  1975   TOTAL KNEE ARTHROPLASTY Right 2003   TOTAL KNEE REVISION Right 01/19/2022   Procedure: Right knee polyethylene revision;  Surgeon: Gaynelle Arabian, MD;  Location: [facility];  Service: Orthopedics;  Laterality: Right;   TRANSTHORACIC ECHOCARDIOGRAM  05/2018; 12/03/2019   (after MI secondary to arterial spasm): EF  45-50%; severe hypokinesis of apical anterolateral, inferolateral , anterior, and inferior LV myocardium. 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   Shortness of breath 04/19/2019   Healthcare maintenance 04/19/2019   Overweight (BMI 25.0-29.9) 02/19/2019   Ischemic cardiomyopathy 09/03/2018   Chest pain 07/11/2018   Coronary vasospasm ([place]) 07/11/2018   NSTEMI (non-ST elevated myocardial infarction) ([place]) 05/14/2018   Acute encephalopathy 11/19/2017   Hyponatremia 11/16/2017   Bipolar 1 disorder ([place]) 12/29/2016   Hypertension 12/29/2016   TIA (transient ischemic attack) 12/29/2016   Chronic pelvic pain in female 08/22/2016   Stage 2 chronic kidney disease 08/22/2016   Osteoporosis 09/30/2015   Health maintenance examination 06/11/2015   Suprapubic discomfort 06/05/2015   Urethral caruncle 06/05/2015   Infection of urinary tract 06/05/2015   TMJ (temporomandibular joint disorder) 02/25/2015   Right ankle injury 11/05/2014   Cervical spondylosis without myelopathy 12/19/2013   Sprain of neck 12/19/2013   Memory difficulties 12/19/2013   Postnasal drip 10/28/2013   Renal calculus 08/05/2013   Urinary frequency 08/01/2013   Constipation, chronic 07/29/2013   COPD (chronic obstructive pulmonary disease) ([place]) 06/30/2013   Abnormal chest x-ray 06/30/2013   Benign essential HTN 05/29/2013   Kidney stone on left side 05/29/2013   Hyperlipidemia 05/29/2013    PCP: Shawnie Dapper  REFERRING PROVIDER: Jearld Lesch  REFERRING DIAG: presence of Right artificial knee joint  THERAPY DIAG:  Acute pain of right knee  Stiffness of right knee, not elsewhere classified  Other abnormalities of gait and mobility  Rationale for Evaluation and Treatment:  Rehabilitation  ONSET DATE: 01/19/22  SUBJECTIVE:   SUBJECTIVE STATEMENT:  02/23/2022 No new complaints.   Eval: Pt had R total knee revision surgery 10/18, discharged 10/19. Doing well, has been using RW. She is having a hard time with transportation to appointments. F/U with MD on 10/30.    PERTINENT HISTORY: Bipolar, TIA, Osteoporosis, CKD ,COPD,    PAIN:  Are you having pain? Yes: NPRS scale: 4-5 /10 Pain location: R knee Pain description: Sore, Tight,  Aggravating factors: Movement, bending, walking  Relieving factors: Rest, ice    PRECAUTIONS: Knee  WEIGHT BEARING RESTRICTIONS: No  FALLS:  Has patient fallen in last 6 months? No  PLOF: Independent  PATIENT GOALS:  Decreased pain, regain use of knee, walking.    OBJECTIVE:   DIAGNOSTIC FINDINGS:   COGNITION: Overall cognitive status: Within functional limits for tasks assessed     SENSATION: WFL  EDEMA:  Mild edema in R knee and lower leg   POSTURE: standing with R knee in flexion   PALPATION:  healing incision on R knee, covered with dressing.    LOWER EXTREMITY ROM:  Active ROM Right eval R 02/03/22 R 02/14/22  Hip flexion     Hip extension     Hip abduction     Hip adduction     Hip internal rotation     Hip external rotation     Knee flexion 105 113   Knee extension -5 -2   Ankle dorsiflexion     Ankle plantarflexion     Ankle inversion     Ankle eversion      (Blank rows = not tested)  LOWER EXTREMITY MMT:  MMT Right eval Left eval  Hip flexion    Hip extension    Hip abduction    Hip adduction    Hip internal rotation    Hip external rotation    Knee flexion 4   Knee extension 4-   Ankle dorsiflexion    Ankle plantarflexion    Ankle inversion    Ankle eversion     (Blank rows = not tested)  LOWER EXTREMITY SPECIAL TESTS:    GAIT: Distance walked: 100 Assistive device utilized: Environmental consultant - 2 wheeled Level of assistance: Complete Independence Comments: increased  knee flexion in stance phase    TODAY'S TREATMENT:                                                                                                                              DATE:   02/23/22 Therapeutic Exercise: Aerobic: Recumbent bike L1 x 9 min  Supine:  SLR 2 x 10 bil;    Seated: LAQ 2 lb x 20 on R;  GTB x 20 on R;  Standing:  Marching with TKE x 20; Step ups  6 in x 10 on R;  Stairs, up/down 5 steps x 5,  1 Hand rail; TKE with RTB x 20;  Ambulation: 150 ft with cueing for heel strike and TKE  Stretches: Seated HSS 30 sec x 3 on R;  gastroc stretch on step x 5 bil   Neuromuscular Re-education: Manual Therapy:  PROM for knee extension  Gait:   Previous: Therapeutic Exercise: Aerobic: Recumbent bike L1 x 8 min  Supine:  SLR 2 x 10 bil;  Quad sets x 20 on R;  Seated: LAQ 2lb x 20 on R;   Sit to stand x 5 from higher mat table;  Standing:  pre-gait fwd and bwd  stepping/ weight shift onto R LE- with TKE x 20;  [place]erral x 15 on R;  TKE/quad set x 15 on R;  Ambulation: 45 ft x 8, no AD  Stretches: Seated HSS 30 sec x 5 on R;   Neuromuscular Re-education: Manual Therapy: PROM for knee extension and flexion  Gait:    PATIENT EDUCATION:  Education details: Reviewed HEP Person educated: Patient Education method: Explanation, Demonstration, Tactile cues, Verbal cues, and Handouts Education comprehension: verbalized understanding, returned demonstration, verbal cues required, tactile cues required, and needs further education  HOME EXERCISE PROGRAM: Access Code: [email or web address]   ASSESSMENT:  CLINICAL IMPRESSION:  02/23/2022  Pt with mild improvement/ability for TKE and gait mechanics today, without SPC. Still having difficulty with full TKE in standing. Will benefit from continued care for strength and TKE.   Eval: Patient presents with deficits from R total knee revision on 01/19/22. She has decreased ROM, and mild joint stiffness for flexion and extension. She has decreased strength in  quad and LE. She has decreased ability for gait, stairs, and functional activities due to pain and deficit. Pt to benefit from skilled PT to improve deficits and return to PLOF.   OBJECTIVE IMPAIRMENTS: Abnormal gait, decreased activity tolerance, decreased balance, decreased knowledge of use of DME, decreased mobility, difficulty walking, decreased ROM, decreased strength, decreased safety awareness, hypomobility, increased muscle spasms, impaired flexibility, and pain.   ACTIVITY LIMITATIONS: lifting, bending, sitting, standing, squatting, stairs, transfers, bed mobility, bathing, and locomotion level  PARTICIPATION LIMITATIONS: meal prep, cleaning, laundry, driving, shopping, and community activity  PERSONAL FACTORS:  none  are also affecting patient's functional outcome.   REHAB POTENTIAL: Good  CLINICAL DECISION MAKING: Stable/uncomplicated  EVALUATION COMPLEXITY: Low   GOALS: Goals reviewed with patient? Yes  SHORT TERM GOALS: Target date: 02/11/2022   Pt to be independent with initial HEP  Goal status: MET  2.  Pt to demo improved Flexion ROM by at least 10 deg, and at least 3 deg of extension  in R knee.    Goal status: MET    LONG TERM GOALS: Target date: 03/25/2022    Pt to be independent with final HEP  Goal status: INITIAL   2.  Pt to demo full ROM of R knee, to improve ability for ambulation and IADLs.   Goal status: INITIAL  3.  Pt to demo improved strength of R LE to at least 4+/5 to improve ability for walking, stairs, and IADLs.   Goal status: INITIAL  4.  Pt to demo ability for independent ambulation with mechanics WNL for pt age, with LRAD, for community distances.   Goal status: INITIAL  5.  Pt to demo ability for stair climbing, at least 5 steps, independently with 1 hand rail, to improve ability for community navigation.   Goal status: INITIAL    PLAN:  PT FREQUENCY: 2x/week  PT DURATION: 8 weeks  PLANNED INTERVENTIONS: Therapeutic  exercises, Therapeutic activity, Neuromuscular re-education, Balance training, Gait training, Patient/Family education, Self Care, Joint mobilization, Joint manipulation, Stair training, Orthotic/Fit training, DME instructions, Dry Needling, Electrical stimulation, Spinal manipulation, Spinal mobilization, Cryotherapy, Moist heat, Taping, Vasopneumatic device, Traction, Ultrasound, Ionotophoresis 45m/ml Dexamethasone, and Manual therapy  PLAN FOR NEXT SESSION:    LLyndee Hensen PT, DPT 1:54 PM  02/23/22

## 2022-02-28 ENCOUNTER — Ambulatory Visit (INDEPENDENT_AMBULATORY_CARE_PROVIDER_SITE_OTHER): Payer: Medicare Other | Admitting: Physical Therapy

## 2022-02-28 ENCOUNTER — Encounter: Payer: Self-pay | Admitting: Physical Therapy

## 2022-02-28 DIAGNOSIS — R2689 Other abnormalities of gait and mobility: Secondary | ICD-10-CM

## 2022-02-28 DIAGNOSIS — M25561 Pain in right knee: Secondary | ICD-10-CM

## 2022-02-28 DIAGNOSIS — M25661 Stiffness of right knee, not elsewhere classified: Secondary | ICD-10-CM

## 2022-02-28 NOTE — Therapy (Signed)
OUTPATIENT PHYSICAL THERAPY LOWER EXTREMITY TREATMENT    Patient Name: Erin Lopez MRN: [id number] DOB:[birth year], 71 y.o., female Today's Date: 02/28/2022   PT End of Session - 02/28/22 1310     Visit Number 9    Number of Visits 16    Date for PT Re-Evaluation 03/25/22    Authorization Type UHC- MCR    PT Start Time 1308    PT Stop Time 1346    PT Time Calculation (min) 38 min    Activity Tolerance Patient tolerated treatment well    Behavior During Therapy WFL for tasks assessed/performed             Past Medical History:  Diagnosis Date   Alcoholism in remission ([place])    states last alcohol 11 yrs ago   Anemia    Anxiety    Bipolar 1 disorder ([place])    Admission for mania x 2 (most recent 11/2017)   Cancer ([place])    Cervical spondylosis without myelopathy 12/19/2013   MRI 02/2014: multilevel DDD/spondylosis, not much change compared to prior MRI.  Pt not in favor of invasive therapy for her neck as of 04/2014.   Cholelithiasis    COPD (chronic obstructive pulmonary disease) (HCC)    Moderate: anoro started 04/2017 by pulm, pt symptomatically improved and PFTs stable at f/u 06/2017.   Coronary artery spasm ([place]) 07/11/2018   nitrates=HA. Amlodipine=intol swelling.  Changed to coreg 09/03/18 by cardiologist.   Depression    Dilutional hyponatremia    Endometritis 05/2017   possible; empiric tx with Flagyl by GYN.   Environmental allergies    Fibromyalgia    GERD (gastroesophageal reflux disease)    Hepatic steatosis 03/2019   u/s abd   Herpes zoster 08/07/2017   L scapular region   Hiatal hernia    History of adenomatous polyp of colon 04/2008; 08/2014   No high grade dysplasia: recall 5 yrs ([name], [name])   History of basal cell carcinoma excision    NOSE   History of Helicobacter pylori infection 04/2008   +gastric biopsy (gastritis but no metaplasia, dysplasia, or malignancy identified)   History of kidney stones    History of TIA (transient  ischemic attack)    secondary to HELLP syndrome 1994 post c/s--  residual memory loss   Hyperlipidemia    statin started after her NSTEMI: goal  LDL <70.   Hypertension    Internal hemorrhoids    Ischemic cardiomyopathy 05/2018   Echo EF 45-50% in context of NSTEMI from CA spasm. 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 04/2017.   Leg pain    ABIs 11/2019: normal    Memory loss    Abnl MRI brain and CT brain c/w chronic microvascular ischemia.  Repeat MRI brain 02/2014 stable (Dr. Mayra Reel with no plan to f/u with neuro as of 04/2014)   NSTEMI (non-ST elevated myocardial infarction) ([place]) 05/2018   Echo EF 45-50% in context of NSTEMI from CA spasm.// Myoview 8/21: EF 66, no ischemia, low risk   Osteoarthritis of left wrist    STT and 1st [place] jt   Osteoporosis 05/2010   2012 'penia; 06/2015 'porosis:  prolia.  DEXA 09/2017 T-score -2.2 femur neck. 11/2021 T score -2.2. Rpt 2 yrs  Pelvic floor dysfunction    [facility] ([name])   Pneumonia    Postmenopausal vaginal bleeding 05/2017   x 1 small episode; GYN attempted endo bx but unable to penetrate cervix due to severe cerv stenosis (due to postmenopausal state + hx of LEEP).  Endo u/s showed thin endo lining.  Per GYN---no evidence of endomet pathology on w/u---obs as of 07/2017.   Pre-diabetes    Prediabetes 06/2016   Fasting gluc 105; HbA1c at that time was 6.0%.  A1c 6.2% 12/2017.  A1c 5.7% Feb 2023.   Recurrent kidney stones 08/2013   Left ureteral calculus: Perc nephr--cystoscopy w/ureteroscopy + laser for stone removal.  Residual asymptomatic left renal nephrolithiasis <71m present post-procedure.  Right sided hydronephrosis-persistent (on u/s)--urol ordered CT to further eval  08/2016: 1.6 cm nonobstructing stone lower pole left kidney, no hydro, plan for PCN extraction ([facility])   Shortness of breath    Sprain of neck 12/19/2013   Stroke (HCC)    TIA   TMJ (temporomandibular joint disorder)    USES  MOUTH GUARD   Toe fracture, left 12/2017   2nd toe prox phalanx (immobilization, Dr. RZadie Rhine   Tubular adenoma of colon    Vitamin D deficiency 05/2011   Weak urinary stream 07/2016   with elevated PVR and mild left hydronephrosis secondary to this ([facility] started flomax 0.457mqd for this).   Past Surgical History:  Procedure Laterality Date   ABIs  11/29/2019   NORMAL   ANTERIOR CERVICAL DECOMP/DISCECTOMY FUSION  06/30/2009   C3 -- C5  AND EXPLORATION OF FUSION C5-7 W/  PLATE REMOVAL   ARTHRODESIS METATARSALPHALANGEAL JOINT (MTPJ) Left 07/08/2021   Procedure: Left hallux metatarsophalangeal joint arthrodesis;  Surgeon: HeWylene SimmerMD;  [facility]: [facility]opedics;  Laterality: Left;   BACK SURGERY     BUNIONECTOMY Left    CARDIOVASCULAR STRESS TEST  12/03/2019   myoc perf im: NORMAL   CERVICAL FUSION  2002   anterior C5 -- C7   CERVICAL SPINE SURGERY  08/27/1999     C6 -- T1  LAMINECTOMY/  DISKECTOMY   CESAREAN SECTION  1994   CHOLECYSTECTOMY N/A 03/06/2020   Procedure: LAPAROSCOPIC CHOLECYSTECTOMY;  Surgeon: StFelicie MornMD;  Location: [facility] Service: General;  Laterality: N/A;   COLONOSCOPY W/ POLYPECTOMY  04/2008;08/2014   2016 tubular adenoma x 1; +diverticulosis and int/ext hemorrhoids.  06/2020 adenoma, recall 5 yrs.   CYSTOSCOPY WITH URETEROSCOPY AND STENT PLACEMENT Left 08/28/2013   Procedure: CYSTOSCOPY, RGP,  WITH URETEROSCOPY AND STENT REMOVAL;  Surgeon: DaBernestine AmassMD;  [facility];  Laterality: Left;   DEXA  09/2017   T-score -2.2 femur neck: improved compared to 06/2015.  2021 DEXA T score -2.3.  11/2021 DEXA T score -2.2.    ESOPHAGOGASTRODUODENOSCOPY  2010; 08/2014   2016 +Candidal esophagitis; Mild chronic gastritis w/intestinal metaplasia--NEG H pylori, neg for eosinophilic esoph. 3/4/4315soph candidiasis (diflucan x 20d), o/w normal.   HAMMER TOE SURGERY Left 07/08/2021   Procedure: Second metatarsal head excision; revision Second hammertoe correction; 2-5 percutaneus flexor tenotomies;  Surgeon: HeWylene SimmerMD;  [facility]: [facility];  Laterality: Left;   HARDWARE REMOVAL Left 07/08/2021   Procedure: Removal of deep implants from hallux proximal phalanx and First and Second metatarsals;  Surgeon: HeWylene SimmerMD;  [facility] Service: Orth[facility]ics;  Laterality: Left;   HOLMIUM LASER  APPLICATION Left 81/82/9937   Procedure: HOLMIUM LASER APPLICATION;  Surgeon: [name], MD;  Location[facility];  Service: Urology;  Laterality: Left;   JOINT REPLACEMENT     LEFT HEART CATH AND CORONARY ANGIOGRAPHY N/A 05/15/2018   Evidence for spasm in distal LAD.  Mod RCA atherosclerosis, o/w no CAD.  Procedure: LEFT HEART CATH AND CORONARY ANGIOGRAPHY;  Surgeon: Leonie Man, MD;  Location: [facility];  Service: Cardiovascular;  Laterality: N/A;   NEGATIVE SLEEP STUDY  04/01/2007   NEPHROLITHOTOMY Left 08/05/2013   Procedure: NEPHROLITHOTOMY PERCUTANEOUS;  Surgeon: [name], MD;  Location: [facility];  Service: Urology;  Laterality: Left;   POLYSOMNOGRAM  01/2019   NORMAL   POLYSOMNOGRAPHY  10/25/2018   Dr. Dion Saucier due to pt inadequat sleep time (83 min).   TONSILLECTOMY AND ADENOIDECTOMY  1975   TOTAL KNEE ARTHROPLASTY Right 2003   TOTAL KNEE REVISION Right 01/19/2022   Procedure: Right knee polyethylene revision;  Surgeon: Gaynelle Arabian, MD;  Location: [facility];  Service: Orthopedics;  Laterality: Right;   TRANSTHORACIC ECHOCARDIOGRAM  05/2018; 12/03/2019   (after MI secondary to arterial spasm): EF  45-50%; severe hypokinesis of apical anterolateral, inferolateral , anterior, and inferior LV myocardium. 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   Shortness of breath 04/19/2019   Healthcare maintenance 04/19/2019   Overweight (BMI 25.0-29.9) 02/19/2019   Ischemic cardiomyopathy 09/03/2018   Chest pain 07/11/2018   Coronary vasospasm ([place]) 07/11/2018   NSTEMI (non-ST elevated myocardial infarction) ([place]) 05/14/2018   Acute encephalopathy 11/19/2017   Hyponatremia 11/16/2017   Bipolar 1 disorder ([place]) 12/29/2016   Hypertension 12/29/2016   TIA (transient ischemic attack) 12/29/2016   Chronic pelvic pain in female 08/22/2016   Stage 2 chronic kidney disease 08/22/2016   Osteoporosis 09/30/2015   Health maintenance examination 06/11/2015   Suprapubic discomfort 06/05/2015   Urethral caruncle 06/05/2015   Infection of urinary tract 06/05/2015   TMJ (temporomandibular joint disorder) 02/25/2015   Right ankle injury 11/05/2014   Cervical spondylosis without myelopathy 12/19/2013   Sprain of neck 12/19/2013   Memory difficulties 12/19/2013   Postnasal drip 10/28/2013   Renal calculus 08/05/2013   Urinary frequency 08/01/2013   Constipation, chronic 07/29/2013   COPD (chronic obstructive pulmonary disease) ([place]) 06/30/2013   Abnormal chest x-ray 06/30/2013   Benign essential HTN 05/29/2013   Kidney stone on left side 05/29/2013   Hyperlipidemia 05/29/2013    PCP: Shawnie Dapper  REFERRING PROVIDER: Jearld Lesch  REFERRING DIAG: presence of Right artificial knee joint  THERAPY DIAG:  Acute pain of right knee  Stiffness of right knee, not elsewhere classified  Other abnormalities of gait and mobility  Rationale for Evaluation and Treatment:  Rehabilitation  ONSET DATE: 01/19/22  SUBJECTIVE:   SUBJECTIVE STATEMENT:  02/28/2022 No new complaints. No longer using cane.   Eval: Pt had R total knee revision surgery 10/18, discharged 10/19. Doing well, has been using RW. She is having a hard time with transportation to appointments. F/U with MD on 10/30.    PERTINENT HISTORY: Bipolar, TIA, Osteoporosis, CKD ,COPD,    PAIN:  Are you having pain? Yes: NPRS scale: 4-5 /10 Pain location: R knee Pain description: Sore, Tight,  Aggravating factors:  Movement, bending, walking  Relieving factors: Rest, ice    PRECAUTIONS: Knee  WEIGHT BEARING RESTRICTIONS: No  FALLS:  Has patient fallen in last 6 months? No  PLOF: Independent  PATIENT GOALS:  Decreased pain, regain use of knee, walking.    OBJECTIVE:   DIAGNOSTIC FINDINGS:   COGNITION: Overall cognitive status: Within functional limits for tasks assessed     SENSATION: WFL  EDEMA:  Mild edema in R knee and lower leg   POSTURE: standing with R knee in flexion   PALPATION:  healing incision on R knee, covered with dressing.    LOWER EXTREMITY ROM:  Active ROM Right eval R 02/03/22 R 02/28/22  Hip flexion     Hip extension     Hip abduction     Hip adduction     Hip internal rotation     Hip external rotation     Knee flexion 105 113 126   Knee extension -5 -2 -2  Ankle dorsiflexion     Ankle plantarflexion     Ankle inversion     Ankle eversion      (Blank rows = not tested)  LOWER EXTREMITY MMT:  MMT Right eval Left eval  Hip flexion    Hip extension    Hip abduction    Hip adduction    Hip internal rotation    Hip external rotation    Knee flexion 4   Knee extension 4-   Ankle dorsiflexion    Ankle plantarflexion    Ankle inversion    Ankle eversion     (Blank rows = not tested)  LOWER EXTREMITY SPECIAL TESTS:    GAIT: Distance walked: 100 Assistive device utilized: Walker - 2 wheeled Level of assistance: Complete  Independence Comments: increased knee flexion in stance phase    TODAY'S TREATMENT:                                                                                                                              DATE:   02/28/22 Therapeutic Exercise: Aerobic:   Supine:  SLR 2 x 10 bil;    Seated: LAQ 3 lb x 20 bil;  Standing:  Marching with TKE x 20;  Step ups  6 in x 10 on R;  fwd/bwd step throughs x 20 on R ;  TKE with RTB x 20;  Ambulation: 150 ft with cueing for heel strike and TKE  Stretches: Seated HSS 30 sec x 3 on R;  gastroc stretch at wall x 3 on R;   Neuromuscular Re-education: Manual Therapy:  PROM and joint mobs to increase knee extension  Gait:    Previous: Therapeutic Exercise: Aerobic: Recumbent bike L1 x 8 min  Supine:  SLR 2 x 10 bil;  Quad sets x 20 on R;  Seated: LAQ 2lb x 20 on R;   Sit to stand x 5 from higher mat table;  Standing:  pre-gait fwd and bwd stepping/  weight shift onto R LE- with TKE x 20;  [place] x 15 on R;  TKE/quad set x 15 on R;  Ambulation: 45 ft x 8, no AD  Stretches: Seated HSS 30 sec x 5 on R;   Neuromuscular Re-education: Manual Therapy: PROM for knee extension and flexion  Gait:    PATIENT EDUCATION:  Education details: Reviewed HEP Person educated: Patient Education method: Explanation, Demonstration, Tactile cues, Verbal cues, and Handouts Education comprehension: verbalized understanding, returned demonstration, verbal cues required, tactile cues required, and needs further education  HOME EXERCISE PROGRAM: Access Code: [email or web address]   ASSESSMENT:  CLINICAL IMPRESSION:  02/28/2022  Pt showing good progress. She has improving ability for stairs and stability of R knee in standing. She still has mild deficits for ease of TKE with standing and walking activity, but it has improved in last couple weeks. ROM doing great for flexion, up to 126 degrees today, extension also much improved, but still lacking 2 degrees. Pt to benefit from  continued strength and stability as well as full extension.   Eval: Patient presents with deficits from R total knee revision on 01/19/22. She has decreased ROM, and mild joint stiffness for flexion and extension. She has decreased strength in quad and LE. She has decreased ability for gait, stairs, and functional activities due to pain and deficit. Pt to benefit from skilled PT to improve deficits and return to PLOF.   OBJECTIVE IMPAIRMENTS: Abnormal gait, decreased activity tolerance, decreased balance, decreased knowledge of use of DME, decreased mobility, difficulty walking, decreased ROM, decreased strength, decreased safety awareness, hypomobility, increased muscle spasms, impaired flexibility, and pain.   ACTIVITY LIMITATIONS: lifting, bending, sitting, standing, squatting, stairs, transfers, bed mobility, bathing, and locomotion level  PARTICIPATION LIMITATIONS: meal prep, cleaning, laundry, driving, shopping, and community activity  PERSONAL FACTORS:  none  are also affecting patient's functional outcome.   REHAB POTENTIAL: Good  CLINICAL DECISION MAKING: Stable/uncomplicated  EVALUATION COMPLEXITY: Low   GOALS: Goals reviewed with patient? Yes  SHORT TERM GOALS: Target date: 02/11/2022   Pt to be independent with initial HEP  Goal status: MET  2.  Pt to demo improved Flexion ROM by at least 10 deg, and at least 3 deg of extension  in R knee.    Goal status: MET    LONG TERM GOALS: Target date: 03/25/2022    Pt to be independent with final HEP  Goal status: INITIAL   2.  Pt to demo full ROM of R knee, to improve ability for ambulation and IADLs.   Goal status: INITIAL  3.  Pt to demo improved strength of R LE to at least 4+/5 to improve ability for walking, stairs, and IADLs.   Goal status: INITIAL  4.  Pt to demo ability for independent ambulation with mechanics WNL for pt age, with LRAD, for community distances.   Goal status: INITIAL  5.  Pt to demo  ability for stair climbing, at least 5 steps, independently with 1 hand rail, to improve ability for community navigation.   Goal status: INITIAL    PLAN:  PT FREQUENCY: 2x/week  PT DURATION: 8 weeks  PLANNED INTERVENTIONS: Therapeutic exercises, Therapeutic activity, Neuromuscular re-education, Balance training, Gait training, Patient/Family education, Self Care, Joint mobilization, Joint manipulation, Stair training, Orthotic/Fit training, DME instructions, Dry Needling, Electrical stimulation, Spinal manipulation, Spinal mobilization, Cryotherapy, Moist heat, Taping, Vasopneumatic device, Traction, Ultrasound, Ionotophoresis 43m/ml Dexamethasone, and Manual therapy  PLAN FOR NEXT SESSION:    LLyndee Hensen PT, DPT 1:11  PM  02/28/22

## 2022-03-03 ENCOUNTER — Encounter: Payer: Self-pay | Admitting: Physical Therapy

## 2022-03-03 ENCOUNTER — Ambulatory Visit (INDEPENDENT_AMBULATORY_CARE_PROVIDER_SITE_OTHER): Payer: Medicare Other | Admitting: Physical Therapy

## 2022-03-03 DIAGNOSIS — M25561 Pain in right knee: Secondary | ICD-10-CM

## 2022-03-03 DIAGNOSIS — M25661 Stiffness of right knee, not elsewhere classified: Secondary | ICD-10-CM | POA: Diagnosis not present

## 2022-03-03 DIAGNOSIS — R2689 Other abnormalities of gait and mobility: Secondary | ICD-10-CM | POA: Diagnosis not present

## 2022-03-03 NOTE — Therapy (Signed)
OUTPATIENT PHYSICAL THERAPY LOWER EXTREMITY TREATMENT /Progress Note   Patient Name: Erin Lopez MRN: [id number] DOB:Nov 25, 1950, 71 y.o., female Today's Date: 03/03/2022  Physical Therapy Progress Note  Dates of Reporting Period: 01/28/22  to  03/03/22     PT End of Session - 03/03/22 1310     Visit Number 10    Number of Visits 16    Date for PT Re-Evaluation 03/25/22    Authorization Type UHC- MCR    PT Start Time 1309    PT Stop Time 1347    PT Time Calculation (min) 38 min    Activity Tolerance Patient tolerated treatment well    Behavior During Therapy WFL for tasks assessed/performed             Past Medical History:  Diagnosis Date   Alcoholism in remission ([place])    states last alcohol 11 yrs ago   Anemia    Anxiety    Bipolar 1 disorder ([place])    Admission for mania x 2 (most recent 11/2017)   Cancer ([place])    Cervical spondylosis without myelopathy 12/19/2013   MRI 02/2014: multilevel DDD/spondylosis, not much change compared to prior MRI.  Pt not in favor of invasive therapy for her neck as of 04/2014.   Cholelithiasis    COPD (chronic obstructive pulmonary disease) (HCC)    Moderate: anoro started 04/2017 by pulm, pt symptomatically improved and PFTs stable at f/u 06/2017.   Coronary artery spasm ([place]) 07/11/2018   nitrates=HA. Amlodipine=intol swelling.  Changed to coreg 09/03/18 by cardiologist.   Depression    Dilutional hyponatremia    Endometritis 05/2017   possible; empiric tx with Flagyl by GYN.   Environmental allergies    Fibromyalgia    GERD (gastroesophageal reflux disease)    Hepatic steatosis 03/2019   u/s abd   Herpes zoster 08/07/2017   L scapular region   Hiatal hernia    History of adenomatous polyp of colon 04/2008; 08/2014   No high grade dysplasia: recall 5 yrs ([name], [name])   History of basal cell carcinoma excision    NOSE   History of Helicobacter pylori infection 04/2008   +gastric biopsy (gastritis but no  metaplasia, dysplasia, or malignancy identified)   History of kidney stones    History of TIA (transient ischemic attack)    secondary to HELLP syndrome 1994 post c/s--  residual memory loss   Hyperlipidemia    statin started after her NSTEMI: goal  LDL <70.   Hypertension    Internal hemorrhoids    Ischemic cardiomyopathy 05/2018   Echo EF 45-50% in context of NSTEMI from CA spasm. 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 04/2017.   Leg pain    ABIs 11/2019: normal    Memory loss    Abnl MRI brain and CT brain c/w chronic microvascular ischemia.  Repeat MRI brain 02/2014 stable (Dr. Mayra Reel with no plan to f/u with neuro as of 04/2014)   NSTEMI (non-ST elevated myocardial infarction) ([place]) 05/2018   Echo EF 45-50% in context of NSTEMI from CA spasm.// Myoview 8/21: EF 66, no ischemia, low risk   Osteoarthritis of left wrist    STT and 1st [place] jt   Osteoporosis 05/2010   2012 'penia; 06/2015 'porosis:  prolia.  DEXA 09/2017 T-score -2.2 femur neck. 11/2021 T score -2.2. Rpt 2 yrs   Pelvic floor dysfunction    [facility] ([name])   Pneumonia    Postmenopausal vaginal bleeding 05/2017   x 1 small episode; GYN attempted endo bx but unable to penetrate cervix due to severe cerv stenosis (due to postmenopausal state + hx of LEEP).  Endo u/s showed thin endo lining.  Per GYN---no evidence of endomet pathology on w/u---obs as of 07/2017.   Pre-diabetes    Prediabetes 06/2016   Fasting gluc 105; HbA1c at that time was 6.0%.  A1c 6.2% 12/2017.  A1c 5.7% Feb 2023.   Recurrent kidney stones 08/2013   Left ureteral calculus: Perc nephr--cystoscopy w/ureteroscopy + laser for stone removal.  Residual asymptomatic left renal nephrolithiasis <73m  present post-procedure.  Right sided hydronephrosis-persistent (on u/s)--urol ordered CT to further eval 08/2016: 1.6 cm nonobstructing stone lower pole left kidney, no hydro, plan for PCN extraction ([facility])   Shortness of breath    Sprain of neck 12/19/2013   Stroke (HCC)    TIA   TMJ (temporomandibular joint disorder)    USES  MOUTH GUARD   Toe fracture, left 12/2017   2nd toe prox phalanx (immobilization, Dr. RZadie Rhine   Tubular adenoma of colon    Vitamin D deficiency 05/2011   Weak urinary stream 07/2016   with elevated PVR and mild left hydronephrosis secondary to this ([facility] started flomax 0.462mqd for this).   Past Surgical History:  Procedure Laterality Date   ABIs  11/29/2019   NORMAL   ANTERIOR CERVICAL DECOMP/DISCECTOMY FUSION  06/30/2009   C3 -- C5  AND EXPLORATION OF FUSION C5-7 W/  PLATE REMOVAL   ARTHRODESIS METATARSALPHALANGEAL JOINT (MTPJ) Left 07/08/2021   Procedure: Left hallux metatarsophalangeal joint arthrodesis;  Surgeon: HeWylene SimmerMD;  [facility] Service: Orthopedics;  Laterality: Left;   BACK SURGERY     BUNIONECTOMY Left    CARDIOVASCULAR STRESS TEST  12/03/2019   myoc perf im: NORMAL   CERVICAL FUSION  2002   anterior C5 -- C7   CERVICAL SPINE SURGERY  08/27/1999     C6 -- T1  LAMINECTOMY/  DISKECTOMY   CESAREAN SECTION  1994   CHOLECYSTECTOMY N/A 03/06/2020   Procedure: LAPAROSCOPIC CHOLECYSTECTOMY;  Surgeon: StFelicie MornMD;  [facility] Service: General;  Laterality: N/A;   COLONOSCOPY W/ POLYPECTOMY  04/2008;08/2014   2016 tubular adenoma x 1; +diverticulosis and int/ext hemorrhoids.  06/2020 adenoma, recall 5 yrs.   CYSTOSCOPY WITH URETEROSCOPY AND STENT PLACEMENT Left 08/28/2013   Procedure: CYSTOSCOPY, RGP,  WITH URETEROSCOPY AND STENT REMOVAL;  Surgeon: DaBernestine AmassMD;  [facility] Service: Urology;  Laterality: Left;   DEXA  09/2017   T-score -2.2 femur neck:  improved compared to 06/2015.  2021 DEXA T score -2.3.  11/2021 DEXA T score -2.2.   ESOPHAGOGASTRODUODENOSCOPY  2010; 08/2014   2016 +Candidal esophagitis; Mild chronic gastritis w/intestinal metaplasia--NEG H pylori, neg for eosinophilic esoph. 3/0/4888soph candidiasis (diflucan x 20d), o/w normal.   HAMMER TOE SURGERY Left 07/08/2021   Procedure: Second metatarsal head excision; revision Second hammertoe correction; 2-5 percutaneus flexor tenotomies;  Surgeon: HeWylene SimmerMD;  [facility];  Laterality: Left;   HARDWARE REMOVAL Left 07/08/2021   Procedure: Removal of deep implants from hallux proximal phalanx and First and Second metatarsals;  Surgeon: HeWylene Simmer  MD;  Location: [place];  Service: Orthopedics;  Laterality: Left;   HOLMIUM LASER APPLICATION Left 37/62/8315   Procedure: HOLMIUM LASER APPLICATION;  Surgeon: [name], MD;  Location: [facility];  Service: Urology;  Laterality: Left;   JOINT REPLACEMENT     LEFT HEART CATH AND CORONARY ANGIOGRAPHY N/A 05/15/2018   Evidence for spasm in distal LAD.  Mod RCA atherosclerosis, o/w no CAD.  Procedure: LEFT HEART CATH AND CORONARY ANGIOGRAPHY;  Surgeon: Leonie Man, MD;  Location: [facility];  Service: Cardiovascular;  Laterality: N/A;   NEGATIVE SLEEP STUDY  04/01/2007   NEPHROLITHOTOMY Left 08/05/2013   Procedure: NEPHROLITHOTOMY PERCUTANEOUS;  Surgeon: [name], MD;  Location: [facility];  Service: Urology;  Laterality: Left;   POLYSOMNOGRAM  01/2019   NORMAL   POLYSOMNOGRAPHY  10/25/2018   Dr. Dion Saucier due to pt inadequat sleep time (83 min).   TONSILLECTOMY AND ADENOIDECTOMY  1975   TOTAL KNEE ARTHROPLASTY Right 2003   TOTAL KNEE REVISION Right 01/19/2022   Procedure: Right knee polyethylene revision;  Surgeon: Gaynelle Arabian, MD;  Location: [facility];  Service: Orthopedics;  Laterality: Right;   TRANSTHORACIC  ECHOCARDIOGRAM  05/2018; 12/03/2019   (after MI secondary to arterial spasm): EF 45-50%; severe hypokinesis of apical anterolateral, inferolateral , anterior, and inferior LV myocardium. 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   Shortness of breath 04/19/2019   Healthcare maintenance 04/19/2019   Overweight (BMI 25.0-29.9) 02/19/2019   Ischemic cardiomyopathy 09/03/2018   Chest pain 07/11/2018   Coronary vasospasm ([place]) 07/11/2018   NSTEMI (non-ST elevated myocardial infarction) ([place]) 05/14/2018   Acute encephalopathy 11/19/2017   Hyponatremia 11/16/2017   Bipolar 1 disorder ([place]) 12/29/2016   Hypertension 12/29/2016   TIA (transient ischemic attack) 12/29/2016   Chronic pelvic pain in female 08/22/2016   Stage 2 chronic kidney disease 08/22/2016   Osteoporosis 09/30/2015   Health maintenance examination 06/11/2015   Suprapubic discomfort 06/05/2015   Urethral caruncle 06/05/2015   Infection of urinary tract 06/05/2015   TMJ (temporomandibular joint disorder) 02/25/2015   Right ankle injury 11/05/2014   Cervical spondylosis without myelopathy 12/19/2013   Sprain of neck 12/19/2013   Memory difficulties 12/19/2013   Postnasal drip 10/28/2013   Renal calculus 08/05/2013   Urinary frequency 08/01/2013   Constipation, chronic 07/29/2013   COPD (chronic obstructive pulmonary disease) ([place]) 06/30/2013   Abnormal chest x-ray 06/30/2013   Benign essential HTN 05/29/2013   Kidney stone on left side 05/29/2013   Hyperlipidemia 05/29/2013    PCP: Shawnie Dapper  REFERRING PROVIDER: Jearld Lesch  REFERRING DIAG: presence of Right artificial knee joint  THERAPY DIAG:  Acute pain of right knee  Stiffness of right knee, not elsewhere classified  Other  abnormalities of gait and mobility  Rationale for Evaluation and Treatment: Rehabilitation  ONSET DATE: 01/19/22  SUBJECTIVE:   SUBJECTIVE STATEMENT:  03/03/2022 No new complaints. Has been doing HEP.   Eval: Pt had R total knee revision surgery 10/18, discharged 10/19. Doing well, has been using RW. She is having a hard time with transportation to appointments. F/U with MD on 10/30.    PERTINENT HISTORY: Bipolar, TIA, Osteoporosis, CKD ,COPD,    PAIN:  Are you having  pain? Yes: NPRS scale: 4-5 /10 Pain location: R knee Pain description: Sore, Tight,  Aggravating factors: Movement, bending, walking  Relieving factors: Rest, ice    PRECAUTIONS: Knee  WEIGHT BEARING RESTRICTIONS: No  FALLS:  Has patient fallen in last 6 months? No  PLOF: Independent  PATIENT GOALS:  Decreased pain, regain use of knee, walking.    OBJECTIVE:  updated 03/03/22  DIAGNOSTIC FINDINGS:   COGNITION: Overall cognitive status: Within functional limits for tasks assessed     SENSATION: WFL  EDEMA:    POSTURE: standing with R knee in slight  flexion   PALPATION:  well healed incision   LOWER EXTREMITY ROM:  Active ROM Right eval R 02/03/22 R 03/03/22  Hip flexion     Hip extension     Hip abduction     Hip adduction     Hip internal rotation     Hip external rotation     Knee flexion 105 113 126   Knee extension -5 -2 -2  Ankle dorsiflexion     Ankle plantarflexion     Ankle inversion     Ankle eversion      (Blank rows = not tested)  LOWER EXTREMITY MMT:  MMT Right eval Left eval R 03/03/22  Hip flexion     Hip extension     Hip abduction     Hip adduction     Hip internal rotation     Hip external rotation     Knee flexion 4  4+  Knee extension 4-  4+  Ankle dorsiflexion     Ankle plantarflexion     Ankle inversion     Ankle eversion      (Blank rows = not tested)   LOWER EXTREMITY SPECIAL TESTS:   GAIT:   TODAY'S TREATMENT:                                                                                                                               DATE:    03/03/22: Therapeutic Exercise: Aerobic:  Recumbent bike L2 x 8 min  Supine:  SLR 2 x 10 bil;    Seated: LAQ 3 lb x 20 bil;  Standing:  Marching with TKE x 20; hip abd 2x10 bil;   Step ups on Air Ex x 10 on R;  R foot on Air Ex- weight shifts with R arm fwd reach x 15;   lateral and fwd step /lunge with weight shift x 10 ea bil; Walk/march in hallway 15 ft x 4; Bwd walking with practice for HEP.  Ambulation: 150 ft with cueing for heel strike and TKE  Stretches: Seated HSS 30 sec x 3 on R;    Neuromuscular Re-education: Manual Therapy:  PROM and joint mobs to increase knee extension  Gait:     02/28/22 Therapeutic Exercise: Aerobic:   Supine:  SLR 2 x 10 bil;    Seated: LAQ 3 lb x 20  bil;  Standing:  Marching with TKE x 20;  Step ups  6 in x 10 on R;  fwd/bwd step throughs x 20 on R ;  TKE with RTB x 20;  Ambulation: 150 ft with cueing for heel strike and TKE  Stretches: Seated HSS 30 sec x 3 on R;  gastroc stretch at wall x 3 on R;   Neuromuscular Re-education: Manual Therapy:  PROM and joint mobs to increase knee extension  Gait:    Previous: Therapeutic Exercise: Aerobic: Recumbent bike L1 x 8 min  Supine:  SLR 2 x 10 bil;  Quad sets x 20 on R;  Seated: LAQ 2lb x 20 on R;   Sit to stand x 5 from higher mat table;  Standing:  pre-gait fwd and bwd stepping/ weight shift onto R LE- with TKE x 20;  [place] x 15 on R;  TKE/quad set x 15 on R;  Ambulation: 45 ft x 8, no AD  Stretches: Seated HSS 30 sec x 5 on R;   Neuromuscular Re-education: Manual Therapy: PROM for knee extension and flexion  Gait:    PATIENT EDUCATION:  Education details: Reviewed HEP Person educated: Patient Education method: Explanation, Demonstration, Tactile cues, Verbal cues, and Handouts Education comprehension: verbalized understanding, returned demonstration, verbal cues required,  tactile cues required, and needs further education  HOME EXERCISE PROGRAM: Access Code: [email or web address]   ASSESSMENT:  CLINICAL IMPRESSION:  03/03/2022 Progress Note  Pt has been seen for 10 visits. She is progressing very well. She has much improved ROM and strength. She is still lacking mild ROM for extension, as also seen with ambulation. Overall doing very well, will likely work towards d/c in next 2 weeks, with focus on extension, stability, and gait mechanics.    Eval: Patient presents with deficits from R total knee revision on 01/19/22. She has decreased ROM, and mild joint stiffness for flexion and extension. She has decreased strength in quad and LE. She has decreased ability for gait, stairs, and functional activities due to pain and deficit. Pt to benefit from skilled PT to improve deficits and return to PLOF.   OBJECTIVE IMPAIRMENTS: Abnormal gait, decreased activity tolerance, decreased balance, decreased knowledge of use of DME, decreased mobility, difficulty walking, decreased ROM, decreased strength, decreased safety awareness, hypomobility, increased muscle spasms, impaired flexibility, and pain.   ACTIVITY LIMITATIONS: lifting, bending, sitting, standing, squatting, stairs, transfers, bed mobility, bathing, and locomotion level  PARTICIPATION LIMITATIONS: meal prep, cleaning, laundry, driving, shopping, and community activity  PERSONAL FACTORS:  none  are also affecting patient's functional outcome.   REHAB POTENTIAL: Good  CLINICAL DECISION MAKING: Stable/uncomplicated  EVALUATION COMPLEXITY: Low   GOALS: Goals reviewed with patient? Yes  SHORT TERM GOALS: Target date: 02/11/2022   Pt to be independent with initial HEP  Goal status: MET  2.  Pt to demo improved Flexion ROM by at least 10 deg, and at least 3 deg of extension  in R knee.    Goal status: MET    LONG TERM GOALS: Target date: 03/25/2022    Pt to be independent with final HEP  Goal status:  INITIAL   2.  Pt to demo full ROM of R knee, to improve ability for ambulation and IADLs.   Goal status: INITIAL  3.  Pt to demo improved strength of R LE to at least 4+/5 to improve ability for walking, stairs, and IADLs.   Goal status: INITIAL  4.  Pt to demo ability for independent ambulation  with mechanics WNL for pt age, with LRAD, for community distances.   Goal status: INITIAL  5.  Pt to demo ability for stair climbing, at least 5 steps, independently with 1 hand rail, to improve ability for community navigation.   Goal status: MET    PLAN:  PT FREQUENCY: 2x/week  PT DURATION: 8 weeks  PLANNED INTERVENTIONS: Therapeutic exercises, Therapeutic activity, Neuromuscular re-education, Balance training, Gait training, Patient/Family education, Self Care, Joint mobilization, Joint manipulation, Stair training, Orthotic/Fit training, DME instructions, Dry Needling, Electrical stimulation, Spinal manipulation, Spinal mobilization, Cryotherapy, Moist heat, Taping, Vasopneumatic device, Traction, Ultrasound, Ionotophoresis 105m/ml Dexamethasone, and Manual therapy  PLAN FOR NEXT SESSION:    LLyndee Hensen PT, DPT 1:11 PM  03/03/22

## 2022-03-07 ENCOUNTER — Ambulatory Visit: Payer: Medicare Other | Admitting: Pulmonary Disease

## 2022-03-07 ENCOUNTER — Encounter: Payer: Self-pay | Admitting: Pulmonary Disease

## 2022-03-07 VITALS — BP 132/68 | HR 66 | Temp 98.0°F | Ht 67.0 in | Wt 174.8 lb

## 2022-03-07 DIAGNOSIS — J449 Chronic obstructive pulmonary disease, unspecified: Secondary | ICD-10-CM

## 2022-03-07 NOTE — Progress Notes (Signed)
Erin Lopez    [id number]    27-Aug-1950  Primary Care Physician:McGowen, Adrian Blackwater, MD  Referring Physician: Tammi Sou, MD [address] [place],  [place] [address]  Chief complaint: Follow-up for COPD, preop for knee surgery  HPI: Erin Lopez is 71 y.o.  with smoking history.  Here for management of COPD.  She has followed intermittently with me since 2017.  Initially on Advair inhaler which is changed to Anoro with good response Evaluated for OSA with negative sleep study a few years ago.  She had an EGD in March 2022 and was diagnosed with esophageal candidiasis.  She was treated with Diflucan  Pets: Dogs.  No cats.  Used to have coronary and parakeet 20 years ago. 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 Exposures: No known exposures Smoking history: 30-pack-year smoking history.  Quit in 1995 Travel History: Not significant.  Interim history: She had recurrent candidiasis with Anoro and was switched to [employer] in 2022.  Feels that the new inhaler is working much better for her  Since her last visit she had right knee placement complications.  Seen in the emergency room in June 2023 for a fall.  CT head and spine were negative for any acute process.  Seen again in the emergency room in August 2023 for shortness of breath.  A CT angiogram with no PE found.  Also did not note wheezing suggestive of COPD exacerbation.  Outpatient Encounter Medications as of 03/07/2022  Medication Sig   acetaminophen (TYLENOL) 500 MG tablet Take 500-1,000 mg by mouth daily as needed for mild pain or fever.   albuterol (VENTOLIN HFA) 108 (90 Base) MCG/ACT inhaler INHALE 2 PUFFS INTO THE LUNGS EVERY 4 HOURS AS NEEDED FOR WHEEZE OR FOR SHORTNESS OF BREATH (Patient taking differently: Inhale 2 puffs into the lungs every 6 (six) hours as needed for shortness of breath or wheezing.)   ALPRAZolam (XANAX) 0.5 MG tablet TAKE 1 TABLET BY MOUTH THREE  TIMES A DAY AS NEEDED FOR ANXIETY (Patient taking differently: Take 0.5 mg by mouth 3 (three) times daily as needed for anxiety.)   Ascorbic Acid (VITAMIN C PO) Take 1 tablet by mouth daily.   atorvastatin (LIPITOR) 80 MG tablet TAKE 1 TABLET BY MOUTH DAILY AT 6 PM. (Patient taking differently: Take 80 mg by mouth daily.)   buPROPion (WELLBUTRIN XL) 150 MG 24 hr tablet TAKE 1 TABLET BY MOUTH EVERY DAY   Cholecalciferol (VITAMIN D-3 PO) Take 1 capsule by mouth daily.   citalopram (CELEXA) 20 MG tablet TAKE 1 TABLET (20 MG TOTAL) BY MOUTH IN THE MORNING AND AT BEDTIME   famotidine (PEPCID) 40 MG tablet TAKE 1 TABLET BY MOUTH EVERY DAY (Patient taking differently: Take 40 mg by mouth daily as needed for heartburn.)   fluticasone (FLONASE) 50 MCG/ACT nasal spray SPRAY 2 SPRAYS INTO EACH NOSTRIL EVERY DAY (Patient taking differently: Place 2 sprays into both nostrils daily. SPRAY 2 SPRAYS INTO EACH NOSTRIL EVERY DAY)   hydrochlorothiazide (HYDRODIURIL) 12.5 MG tablet Take 12.5 mg by mouth daily.   KLOR-CON M20 20 MEQ tablet TAKE 1 TABLET BY MOUTH EVERY DAY   lamoTRIgine (LAMICTAL) 100 MG tablet Take 3 tablets (300 mg total) by mouth at bedtime.   losartan (COZAAR) 25 MG tablet TAKE 1 TABLET BY MOUTH EVERY DAY   MAGNESIUM OXIDE PO Take 1 tablet by mouth daily.   methocarbamol (  ROBAXIN) 500 MG tablet Take 1 tablet (500 mg total) by mouth every 6 (six) hours as needed for muscle spasms.   metoprolol succinate (TOPROL-XL) 100 MG 24 hr tablet TAKE 1 TABLET BY MOUTH DAILY. TAKE WITH OR IMMEDIATELY FOLLOWING A MEAL. (Patient taking differently: Take 100 mg by mouth daily.)   montelukast (SINGULAIR) 10 MG tablet TAKE 1 TABLET BY MOUTH EVERY DAY IN THE EVENING (Patient taking differently: Take 10 mg by mouth at bedtime.)   Multiple Vitamins-Minerals (ZINC PO) Take 1 tablet by mouth daily.   nitroGLYCERIN (NITROSTAT) 0.4 MG SL tablet Place 1 tablet (0.4 mg total) under the tongue every 5 (five) minutes x 3 doses  as needed for chest pain.   oxyCODONE (OXY IR/ROXICODONE) 5 MG immediate release tablet Take 1-2 tablets (5-10 mg total) by mouth every 6 (six) hours as needed for moderate pain or severe pain.   pantoprazole (PROTONIX) 40 MG tablet Take 1 tablet (40 mg total) by mouth daily. Please schedule a yearly follow up for further refills. Thank you. (Patient taking differently: Take 40 mg by mouth daily.)   Tiotropium Bromide-Olodaterol (STIOLTO RESPIMAT) 2.5-2.5 MCG/ACT AERS Inhale 2 each into the lungs daily.   zolpidem (AMBIEN) 10 MG tablet TAKE 1 TABLET BY MOUTH EVERYDAY AT BEDTIME (Patient taking differently: Take 10 mg by mouth at bedtime.)   No facility-administered encounter medications on file as of 03/07/2022.   Physical Exam: Blood pressure 132/68, pulse 66, temperature 98 F (36.7 C), temperature source Oral, height '5\' 7"'$  (1.702 m), weight 174 lb 12.8 oz (79.3 kg), SpO2 94 %. Gen:      No acute distress HEENT:  EOMI, sclera anicteric Neck:     No masses; no thyromegaly Lungs:    Clear to auscultation bilaterally; normal respiratory effort CV:         Regular rate and rhythm; no murmurs Abd:      + bowel sounds; soft, non-tender; no palpable masses, no distension Ext:    No edema; adequate peripheral perfusion Skin:      Warm and dry; no rash Neuro: alert and oriented x 3 Psych: normal mood and affect   Data Reviewed: Imaging: CT abd 08/06/13.- Linear bilateral lower lobe atelectasis or scarring noted. Areas of mild bronchiectasis are noted.  Chest x-ray 04/25/17- mild basilar atelectasis, minimal blunting of the costophrenic angle. Chest x-ray 06/05/2019-no acute cardiopulmonary abnormality. CTA 11/22/2021-no evidence of pulmonary embolism, small left effusion with linear scarring, emphysema. I have reviewed the images personally.  Barium swallow 04/18/08 Minimal hiatal hernia.  GE reflux.   PFTs  06/16/15 FVC 3.01 (85%), FEV1 1.65 (60%), F/F 55 Moderate obstructive  defect.  06/12/17 FVC 2.83 [79%], FEV1 1.69 [60%], F/F 60, TLC 122%, RV/TLC 140%, DLCO 43% Moderate obstructive defect, severe diffusion impairment.  Assessment:  Moderate COPD Currently on Stiolto inhaler which will works well for her Will need pulmonary rehab when she is mobile and completes physical therapy after knee surgery   Health maintenance Does not want any vaccines.  Plan/Recommendations: - Dealer.  Marshell Garfinkel MD [facility] 03/07/2022, 1:51 PM  CC: McGowen, Adrian Blackwater, MD

## 2022-03-07 NOTE — Patient Instructions (Signed)
I am glad you are doing well with regard to your breathing Continue the Stiolto inhaler Follow-up in 1 year.

## 2022-03-08 ENCOUNTER — Encounter: Payer: Medicare Other | Admitting: Physical Therapy

## 2022-03-09 ENCOUNTER — Other Ambulatory Visit: Payer: Self-pay | Admitting: Family Medicine

## 2022-03-11 ENCOUNTER — Other Ambulatory Visit: Payer: Self-pay | Admitting: Pulmonary Disease

## 2022-03-11 ENCOUNTER — Encounter: Payer: Medicare Other | Admitting: Physical Therapy

## 2022-03-11 ENCOUNTER — Other Ambulatory Visit: Payer: Self-pay | Admitting: Family Medicine

## 2022-03-11 ENCOUNTER — Other Ambulatory Visit: Payer: Self-pay | Admitting: Internal Medicine

## 2022-03-14 ENCOUNTER — Ambulatory Visit: Payer: Medicare Other | Admitting: Physical Therapy

## 2022-03-14 ENCOUNTER — Encounter: Payer: Self-pay | Admitting: Physical Therapy

## 2022-03-14 DIAGNOSIS — R2689 Other abnormalities of gait and mobility: Secondary | ICD-10-CM

## 2022-03-14 DIAGNOSIS — M25561 Pain in right knee: Secondary | ICD-10-CM | POA: Diagnosis not present

## 2022-03-14 DIAGNOSIS — M25661 Stiffness of right knee, not elsewhere classified: Secondary | ICD-10-CM

## 2022-03-14 NOTE — Therapy (Signed)
OUTPATIENT PHYSICAL THERAPY LOWER EXTREMITY TREATMENT    Patient Name: Erin Lopez MRN: [id number] DOB:[birth year], 71 y.o., female Today's Date: 03/14/2022       Past Medical History:  Diagnosis Date   Alcoholism in remission [facility])    states last alcohol 11 yrs ago   Anemia    Anxiety    Bipolar 1 disorder ([place])    Admission for mania x 2 (most recent 11/2017)   Cancer [facility])    Cervical spondylosis without myelopathy 12/19/2013   MRI 02/2014: multilevel DDD/spondylosis, not much change compared to prior MRI.  Pt not in favor of invasive therapy for her neck as of 04/2014.   Cholelithiasis    COPD (chronic obstructive pulmonary disease) (HCC)    Moderate: anoro started 04/2017 by pulm, pt symptomatically improved and PFTs stable at f/u 06/2017.   Coronary artery spasm ([place]) 07/11/2018   nitrates=HA. Amlodipine=intol swelling.  Changed to coreg 09/03/18 by cardiologist.   Depression    Dilutional hyponatremia    Endometritis 05/2017   possible; empiric tx with Flagyl by GYN.   Environmental allergies    Fibromyalgia    GERD (gastroesophageal reflux disease)    Hepatic steatosis 03/2019   u/s abd   Herpes zoster 08/07/2017   L scapular region   Hiatal hernia    History of adenomatous polyp of colon 04/2008; 08/2014   No high grade dysplasia: recall 5 yrs ([name], [name])   History of basal cell carcinoma excision    NOSE   History of Helicobacter pylori infection 04/2008   +gastric biopsy (gastritis but no metaplasia, dysplasia, or malignancy identified)   History of kidney stones    History of TIA (transient ischemic attack)    secondary to HELLP syndrome 1994 post c/s--  residual memory loss   Hyperlipidemia    statin started after her NSTEMI: goal  LDL <70.   Hypertension    Internal hemorrhoids    Ischemic cardiomyopathy 05/2018   Echo EF 45-50% in context of NSTEMI from CA spasm. // Echo 8/21: 55-60, mild LVH, normal RVSF, RVSP 40, trivial MR   Left  foot pain    Hallux deformity+adhesive capsulitis+ hammertoe:  Severe structural bunion deformity with hallux interphalangeus and severe arthritis of the second MPJ left foot--surgical repair/osteotomies by [name] 04/2017.   Leg pain    ABIs 11/2019: normal    Memory loss    Abnl MRI brain and CT brain c/w chronic microvascular ischemia.  Repeat MRI brain 02/2014 stable (Dr. Mayra Reel with no plan to f/u with neuro as of 04/2014)   NSTEMI (non-ST elevated myocardial infarction) ([place]) 05/2018   Echo EF 45-50% in context of NSTEMI from CA spasm.// Myoview 8/21: EF 66, no ischemia, low risk   Osteoarthritis of left wrist    STT and 1st [place] jt   Osteoporosis 05/2010   2012 'penia; 06/2015 'porosis:  prolia.  DEXA 09/2017 T-score -2.2 femur neck. 11/2021 T score -2.2. Rpt 2 yrs   Pelvic floor dysfunction    [facility] ([name])   Pneumonia    Postmenopausal vaginal bleeding 05/2017   x 1 small episode; GYN attempted endo bx but unable to penetrate cervix due to severe cerv stenosis (due to postmenopausal state + hx of LEEP).  Endo u/s showed thin endo lining.  Per GYN---no evidence of endomet pathology on w/u---obs as of 07/2017.   Pre-diabetes    Prediabetes 06/2016   Fasting gluc 105; HbA1c at that time was 6.0%.  A1c 6.2% 12/2017.  A1c 5.7% Feb 2023.   Recurrent kidney stones 08/2013   Left ureteral calculus: Perc nephr--cystoscopy w/ureteroscopy + laser for stone removal.  Residual asymptomatic left renal nephrolithiasis <46m present post-procedure.  Right sided hydronephrosis-persistent (on u/s)--urol ordered CT to further eval 08/2016: 1.6 cm nonobstructing stone lower pole left kidney, no hydro, plan for PCN extraction ([facility])   Shortness of breath    Sprain of neck 12/19/2013   Stroke (HCC)    TIA   TMJ (temporomandibular joint disorder)    USES  MOUTH GUARD   Toe fracture, left 12/2017   2nd toe prox phalanx (immobilization, Dr. RZadie Rhine   Tubular adenoma of colon    Vitamin D  deficiency 05/2011   Weak urinary stream 07/2016   with elevated PVR and mild left hydronephrosis secondary to this ([facility] started flomax 0.460mqd for this).   Past Surgical History:  Procedure Laterality Date   ABIs  11/29/2019   NORMAL   ANTERIOR CERVICAL DECOMP/DISCECTOMY FUSION  06/30/2009   C3 -- C5  AND EXPLORATION OF FUSION C5-7 W/  PLATE REMOVAL   ARTHRODESIS METATARSALPHALANGEAL JOINT (MTPJ) Left 07/08/2021   Procedure: Left hallux metatarsophalangeal joint arthrodesis;  Surgeon: HeWylene SimmerMD;  [facility]ics;  Laterality: Left;   BACK SURGERY     BUNIONECTOMY Left    CARDIOVASCULAR STRESS TEST  12/03/2019   myoc perf im: NORMAL   CERVICAL FUSION  2002   anterior C5 -- C7   CERVICAL SPINE SURGERY  08/27/1999     C6 -- T1  LAMINECTOMY/  DISKECTOMY   CESAREAN SECTION  1994   CHOLECYSTECTOMY N/A 03/06/2020   Procedure: LAPAROSCOPIC CHOLECYSTECTOMY;  Surgeon: StFelicie MornMD;  [place] [facility] Service: General;  Laterality: N/A;   COLONOSCOPY W/ POLYPECTOMY  04/2008;08/2014   2016 tubular adenoma x 1; +diverticulosis and int/ext hemorrhoids.  06/2020 adenoma, recall 5 yrs.   CYSTOSCOPY WITH URETEROSCOPY AND STENT PLACEMENT Left 08/28/2013   Procedure: CYSTOSCOPY, RGP,  WITH URETEROSCOPY AND STENT REMOVAL;  Surgeon: DaBernestine AmassMD;  [facility];  Laterality: Left;   DEXA  09/2017   T-score -2.2 femur neck: improved compared to 06/2015.  2021 DEXA T score -2.3.  11/2021 DEXA T score -2.2.   ESOPHAGOGASTRODUODENOSCOPY  2010; 08/2014   2016 +Candidal esophagitis; Mild chronic gastritis w/intestinal metaplasia--NEG H pylori, neg for eosinophilic esoph. 3/8/3419soph candidiasis (diflucan x 20d), o/w normal.   HAMMER TOE SURGERY Left 07/08/2021   Procedure: Second metatarsal head excision; revision Second hammertoe correction; 2-5 percutaneus flexor tenotomies;  Surgeon:  HeWylene SimmerMD;  [facility]: [facility]ics;  Laterality: Left;   HARDWARE REMOVAL Left 07/08/2021   Procedure: Removal of deep implants from hallux proximal phalanx and First and Second metatarsals;  Surgeon: HeWylene SimmerMD;  [facility] Service: Orthopedics;  Laterality: Left;   HOLMIUM LASER APPLICATION Left [id number] Procedure: HOLMIUM LASER APPLICATION;  Surgeon: DaBernestine AmassMD;  [facility];  Laterality: Left;   JOINT REPLACEMENT     LEFT HEART CATH AND CORONARY ANGIOGRAPHY N/A 05/15/2018   Evidence for spasm in distal LAD.  Mod RCA atherosclerosis, o/w no CAD.  Procedure: LEFT HEART CATH AND CORONARY ANGIOGRAPHY;  Surgeon: HaLeonie ManMD;  [facility];  Service: Cardiovascular;  Laterality: N/A;   NEGATIVE  SLEEP STUDY  04/01/2007   NEPHROLITHOTOMY Left 08/05/2013   Procedure: NEPHROLITHOTOMY PERCUTANEOUS;  Surgeon: [name], MD;  Location: [facility];  Service: Urology;  Laterality: Left;   POLYSOMNOGRAM  01/2019   NORMAL   POLYSOMNOGRAPHY  10/25/2018   Dr. Dion Saucier due to pt inadequat sleep time (83 min).   TONSILLECTOMY AND ADENOIDECTOMY  1975   TOTAL KNEE ARTHROPLASTY Right 2003   TOTAL KNEE REVISION Right 01/19/2022   Procedure: Right knee polyethylene revision;  Surgeon: Gaynelle Arabian, MD;  Location: [facility];  Service: Orthopedics;  Laterality: Right;   TRANSTHORACIC ECHOCARDIOGRAM  05/2018; 12/03/2019   (after MI secondary to arterial spasm): EF 45-50%; severe hypokinesis of apical anterolateral, inferolateral , anterior, and inferior LV myocardium. 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    Shortness of breath 04/19/2019   Healthcare maintenance 04/19/2019   Overweight (BMI 25.0-29.9) 02/19/2019   Ischemic cardiomyopathy 09/03/2018   Chest pain 07/11/2018   Coronary vasospasm ([place]) 07/11/2018   NSTEMI (non-ST elevated myocardial infarction) ([place]) 05/14/2018   Acute encephalopathy 11/19/2017   Hyponatremia 11/16/2017   Bipolar 1 disorder ([place]) 12/29/2016   Hypertension 12/29/2016   TIA (transient ischemic attack) 12/29/2016   Chronic pelvic pain in female 08/22/2016   Stage 2 chronic kidney disease 08/22/2016   Osteoporosis 09/30/2015   Health maintenance examination 06/11/2015   Suprapubic discomfort 06/05/2015   Urethral caruncle 06/05/2015   Infection of urinary tract 06/05/2015   TMJ (temporomandibular joint disorder) 02/25/2015   Right ankle injury 11/05/2014   Cervical spondylosis without myelopathy 12/19/2013   Sprain of neck 12/19/2013   Memory difficulties 12/19/2013   Postnasal drip 10/28/2013   Renal calculus 08/05/2013   Urinary frequency 08/01/2013   Constipation, chronic 07/29/2013   COPD (chronic obstructive pulmonary disease) ([place]) 06/30/2013   Abnormal chest x-ray 06/30/2013   Benign essential HTN 05/29/2013   Kidney stone on left side 05/29/2013   Hyperlipidemia 05/29/2013    PCP: Shawnie Dapper  REFERRING PROVIDER: Jearld Lesch  REFERRING DIAG: presence of Right artificial knee joint  THERAPY DIAG:  No diagnosis found.  Rationale for Evaluation and Treatment: Rehabilitation  ONSET DATE: 01/19/22  SUBJECTIVE:   SUBJECTIVE STATEMENT:  03/14/2022 No new complaints. Has been doing HEP.   Eval: Pt had R total knee revision surgery 10/18, discharged 10/19. Doing well, has been using RW. She is having a hard time with transportation to appointments. F/U with MD on 10/30.    PERTINENT HISTORY: Bipolar, TIA, Osteoporosis, CKD ,COPD,    PAIN:  Are you having pain? Yes: NPRS scale: 4-5 /10 Pain location: R knee Pain description:  Sore, Tight,  Aggravating factors: Movement, bending, walking  Relieving factors: Rest, ice    PRECAUTIONS: Knee  WEIGHT BEARING RESTRICTIONS: No  FALLS:  Has patient fallen in last 6 months? No  PLOF: Independent  PATIENT GOALS:  Decreased pain, regain use of knee, walking.    OBJECTIVE:  updated 03/03/22  DIAGNOSTIC FINDINGS:   COGNITION: Overall cognitive status: Within functional limits for tasks assessed     SENSATION: WFL  EDEMA:    POSTURE: standing with R knee in slight  flexion   PALPATION:  well healed incision   LOWER EXTREMITY ROM:  Active ROM Right eval R 02/03/22  R 03/03/22  Hip flexion     Hip extension     Hip abduction     Hip adduction     Hip internal rotation     Hip external rotation     Knee flexion 105 113 126   Knee extension -5 -2 -2  Ankle dorsiflexion     Ankle plantarflexion     Ankle inversion     Ankle eversion      (Blank rows = not tested)  LOWER EXTREMITY MMT:  MMT Right eval Left eval R 03/03/22  Hip flexion     Hip extension     Hip abduction     Hip adduction     Hip internal rotation     Hip external rotation     Knee flexion 4  4+  Knee extension 4-  4+  Ankle dorsiflexion     Ankle plantarflexion     Ankle inversion     Ankle eversion      (Blank rows = not tested)   LOWER EXTREMITY SPECIAL TESTS:   GAIT:   TODAY'S TREATMENT:                                                                                                                              DATE:   03/14/22: Therapeutic Exercise: Aerobic:  Recumbent bike L2 x 7 min  Supine:  SLR 2 x 10 bil;    Seated:   Standing:  Marching, no UE support x 20; hip abd 2x10 bil;    Walk/march in hallway 15 ft x 4; Ambulation: 335f  with practice and cueing for safe direction changes; step ups 6 in x 10 bil;  Stretches: Seated HSS 30 sec x 3 on R;    Neuromuscular Re-education: Manual Therapy:  PROM and joint mobs to increase knee flex and  extension  Gait:     PATIENT EDUCATION:  Education details: Reviewed HEP Person educated: Patient Education method: Explanation, Demonstration, Tactile cues, Verbal cues, and Handouts Education comprehension: verbalized understanding, returned demonstration, verbal cues required, tactile cues required, and needs further education  HOME EXERCISE PROGRAM: Access Code: [email or web address]   ASSESSMENT:  CLINICAL IMPRESSION:  03/14/2022  Pt progressing well. Did have increased soreness last week, but now is doing better. She has improving strength of R quad and hips. She has tendency for foot crossover at times with direction changes , education and practice for safe mechanics with direction changes. Improved ability, but may require further cuing for carryover. Pt overall doing very well, likley d/c next visit.   Eval: Patient presents with deficits from R total knee revision on 01/19/22. She has decreased ROM, and mild joint stiffness for flexion and extension. She has decreased strength in quad and LE. She has decreased ability for gait, stairs, and functional activities due to pain and deficit. Pt to benefit from skilled PT to improve deficits and return to PLOF.   OBJECTIVE IMPAIRMENTS: Abnormal gait,  decreased activity tolerance, decreased balance, decreased knowledge of use of DME, decreased mobility, difficulty walking, decreased ROM, decreased strength, decreased safety awareness, hypomobility, increased muscle spasms, impaired flexibility, and pain.   ACTIVITY LIMITATIONS: lifting, bending, sitting, standing, squatting, stairs, transfers, bed mobility, bathing, and locomotion level  PARTICIPATION LIMITATIONS: meal prep, cleaning, laundry, driving, shopping, and community activity  PERSONAL FACTORS:  none  are also affecting patient's functional outcome.   REHAB POTENTIAL: Good  CLINICAL DECISION MAKING: Stable/uncomplicated  EVALUATION COMPLEXITY: Low   GOALS: Goals reviewed with  patient? Yes  SHORT TERM GOALS: Target date: 02/11/2022   Pt to be independent with initial HEP  Goal status: MET  2.  Pt to demo improved Flexion ROM by at least 10 deg, and at least 3 deg of extension  in R knee.    Goal status: MET    LONG TERM GOALS: Target date: 03/25/2022    Pt to be independent with final HEP  Goal status: INITIAL   2.  Pt to demo full ROM of R knee, to improve ability for ambulation and IADLs.   Goal status: INITIAL  3.  Pt to demo improved strength of R LE to at least 4+/5 to improve ability for walking, stairs, and IADLs.   Goal status: INITIAL  4.  Pt to demo ability for independent ambulation with mechanics WNL for pt age, with LRAD, for community distances.   Goal status: INITIAL  5.  Pt to demo ability for stair climbing, at least 5 steps, independently with 1 hand rail, to improve ability for community navigation.   Goal status: MET    PLAN:  PT FREQUENCY: 2x/week  PT DURATION: 8 weeks  PLANNED INTERVENTIONS: Therapeutic exercises, Therapeutic activity, Neuromuscular re-education, Balance training, Gait training, Patient/Family education, Self Care, Joint mobilization, Joint manipulation, Stair training, Orthotic/Fit training, DME instructions, Dry Needling, Electrical stimulation, Spinal manipulation, Spinal mobilization, Cryotherapy, Moist heat, Taping, Vasopneumatic device, Traction, Ultrasound, Ionotophoresis 6m/ml Dexamethasone, and Manual therapy  PLAN FOR NEXT SESSION:    LLyndee Hensen PT, DPT 1:52 PM  03/14/22

## 2022-03-17 ENCOUNTER — Ambulatory Visit: Payer: Medicare Other | Admitting: Physical Therapy

## 2022-03-17 ENCOUNTER — Encounter: Payer: Self-pay | Admitting: Physical Therapy

## 2022-03-17 DIAGNOSIS — M25561 Pain in right knee: Secondary | ICD-10-CM

## 2022-03-17 DIAGNOSIS — R2689 Other abnormalities of gait and mobility: Secondary | ICD-10-CM | POA: Diagnosis not present

## 2022-03-17 DIAGNOSIS — M25661 Stiffness of right knee, not elsewhere classified: Secondary | ICD-10-CM | POA: Diagnosis not present

## 2022-03-17 NOTE — Therapy (Signed)
OUTPATIENT PHYSICAL THERAPY LOWER EXTREMITY TREATMENT    Patient Name: Erin Lopez MRN: [id number] DOB:[birth year], 71 y.o., female Today's Date: 03/17/2022      PT End of Session - 03/17/22 1319     Visit Number 12    Number of Visits 16    Date for PT Re-Evaluation 03/25/22    Authorization Type UHC- MCR    PT Start Time 1308    PT Stop Time 1347    PT Time Calculation (min) 39 min    Activity Tolerance Patient tolerated treatment well    Behavior During Therapy WFL for tasks assessed/performed             Past Medical History:  Diagnosis Date   Alcoholism in remission ([place])    states last alcohol 11 yrs ago   Anemia    Anxiety    Bipolar 1 disorder ([place])    Admission for mania x 2 (most recent 11/2017)   Cancer ([place])    Cervical spondylosis without myelopathy 12/19/2013   MRI 02/2014: multilevel DDD/spondylosis, not much change compared to prior MRI.  Pt not in favor of invasive therapy for her neck as of 04/2014.   Cholelithiasis    COPD (chronic obstructive pulmonary disease) (HCC)    Moderate: anoro started 04/2017 by pulm, pt symptomatically improved and PFTs stable at f/u 06/2017.   Coronary artery spasm ([place]) 07/11/2018   nitrates=HA. Amlodipine=intol swelling.  Changed to coreg 09/03/18 by cardiologist.   Depression    Dilutional hyponatremia    Endometritis 05/2017   possible; empiric tx with Flagyl by GYN.   Environmental allergies    Fibromyalgia    GERD (gastroesophageal reflux disease)    Hepatic steatosis 03/2019   u/s abd   Herpes zoster 08/07/2017   L scapular region   Hiatal hernia    History of adenomatous polyp of colon 04/2008; 08/2014   No high grade dysplasia: recall 5 yrs ([name], [name])   History of basal cell carcinoma excision    NOSE   History of Helicobacter pylori infection 04/2008   +gastric biopsy (gastritis but no metaplasia, dysplasia, or malignancy identified)   History of kidney stones    History of TIA  (transient ischemic attack)    secondary to HELLP syndrome 1994 post c/s--  residual memory loss   Hyperlipidemia    statin started after her NSTEMI: goal  LDL <70.   Hypertension    Internal hemorrhoids    Ischemic cardiomyopathy 05/2018   Echo EF 45-50% in context of NSTEMI from CA spasm. 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 04/2017.   Leg pain    ABIs 11/2019: normal    Memory loss    Abnl MRI brain and CT brain c/w chronic microvascular ischemia.  Repeat MRI brain 02/2014 stable (Dr. Mayra Reel with no plan to f/u with neuro as of 04/2014)   NSTEMI (non-ST elevated myocardial infarction) ([place]) 05/2018   Echo EF 45-50% in context of NSTEMI from CA spasm.// Myoview 8/21: EF 66, no ischemia, low risk   Osteoarthritis of left wrist    STT and 1st [place] jt   Osteoporosis 05/2010   2012 'penia; 06/2015 'porosis:  prolia.  DEXA 09/2017 T-score -2.2 femur neck. 11/2021 T score -2.2. Rpt 2  yrs   Pelvic floor dysfunction    [facility] ([name])   Pneumonia    Postmenopausal vaginal bleeding 05/2017   x 1 small episode; GYN attempted endo bx but unable to penetrate cervix due to severe cerv stenosis (due to postmenopausal state + hx of LEEP).  Endo u/s showed thin endo lining.  Per GYN---no evidence of endomet pathology on w/u---obs as of 07/2017.   Pre-diabetes    Prediabetes 06/2016   Fasting gluc 105; HbA1c at that time was 6.0%.  A1c 6.2% 12/2017.  A1c 5.7% Feb 2023.   Recurrent kidney stones 08/2013   Left ureteral calculus: Perc nephr--cystoscopy w/ureteroscopy + laser for stone removal.  Residual asymptomatic left renal nephrolithiasis <63m present post-procedure.  Right sided hydronephrosis-persistent (on u/s)--urol ordered CT to further  eval 08/2016: 1.6 cm nonobstructing stone lower pole left kidney, no hydro, plan for PCN extraction ([facility])   Shortness of breath    Sprain of neck 12/19/2013   Stroke (HCC)    TIA   TMJ (temporomandibular joint disorder)    USES  MOUTH GUARD   Toe fracture, left 12/2017   2nd toe prox phalanx (immobilization, Dr. RZadie Rhine   Tubular adenoma of colon    Vitamin D deficiency 05/2011   Weak urinary stream 07/2016   with elevated PVR and mild left hydronephrosis secondary to this ([facility] started flomax 0.468mqd for this).   Past Surgical History:  Procedure Laterality Date   ABIs  11/29/2019   NORMAL   ANTERIOR CERVICAL DECOMP/DISCECTOMY FUSION  06/30/2009   C3 -- C5  AND EXPLORATION OF FUSION C5-7 W/  PLATE REMOVAL   ARTHRODESIS METATARSALPHALANGEAL JOINT (MTPJ) Left 07/08/2021   Procedure: Left hallux metatarsophalangeal joint arthrodesis;  Surgeon: HeWylene SimmerMD;  [facility];  Laterality: Left;   BACK SURGERY     BUNIONECTOMY Left    CARDIOVASCULAR STRESS TEST  12/03/2019   myoc perf im: NORMAL   CERVICAL FUSION  2002   anterior C5 -- C7   CERVICAL SPINE SURGERY  08/27/1999     C6 -- T1  LAMINECTOMY/  DISKECTOMY   CESAREAN SECTION  1994   CHOLECYSTECTOMY N/A 03/06/2020   Procedure: LAPAROSCOPIC CHOLECYSTECTOMY;  Surgeon: StFelicie MornMD;  [place] [facility]: General;  Laterality: N/A;   COLONOSCOPY W/ POLYPECTOMY  04/2008;08/2014   2016 tubular adenoma x 1; +diverticulosis and int/ext hemorrhoids.  06/2020 adenoma, recall 5 yrs.   CYSTOSCOPY WITH URETEROSCOPY AND STENT PLACEMENT Left 08/28/2013   Procedure: CYSTOSCOPY, RGP,  WITH URETEROSCOPY AND STENT REMOVAL;  Surgeon: DaBernestine AmassMD;  [facility];  Laterality: Left;   DEXA  09/2017   T-score -2.2 femur neck: improved compared to 06/2015.  2021 DEXA T score -2.3.  11/2021 DEXA T score -2.2.    ESOPHAGOGASTRODUODENOSCOPY  2010; 08/2014   2016 +Candidal esophagitis; Mild chronic gastritis w/intestinal metaplasia--NEG H pylori, neg for eosinophilic esoph. 3/1/0315soph candidiasis (diflucan x 20d), o/w normal.   HAMMER TOE SURGERY Left 07/08/2021   Procedure: Second metatarsal head excision; revision Second hammertoe correction; 2-5 percutaneus flexor tenotomies;  Surgeon: HeWylene SimmerMD;  [place] [facility]: [facility]ics;  Laterality: Left;   HARDWARE REMOVAL Left 07/08/2021   Procedure: Removal of deep implants from hallux proximal phalanx and First and Second metatarsals;  Surgeon: HeWylene SimmerMD;  [place] [facility]: Orthopedics;  Laterality: Left;  HOLMIUM LASER APPLICATION Left 35/57/3220   Procedure: HOLMIUM LASER APPLICATION;  Surgeon: [name], MD;  [facility];  Service: Urology;  Laterality: Left;   JOINT REPLACEMENT     LEFT HEART CATH AND CORONARY ANGIOGRAPHY N/A 05/15/2018   Evidence for spasm in distal LAD.  Mod RCA atherosclerosis, o/w no CAD.  Procedure: LEFT HEART CATH AND CORONARY ANGIOGRAPHY;  Surgeon: Leonie Man, MD;  Location: [facility];  Service: Cardiovascular;  Laterality: N/A;   NEGATIVE SLEEP STUDY  04/01/2007   NEPHROLITHOTOMY Left 08/05/2013   Procedure: NEPHROLITHOTOMY PERCUTANEOUS;  Surgeon: [name], MD;  Location: [facility];  Service: Urology;  Laterality: Left;   POLYSOMNOGRAM  01/2019   NORMAL   POLYSOMNOGRAPHY  10/25/2018   Dr. Dion Saucier due to pt inadequat sleep time (83 min).   TONSILLECTOMY AND ADENOIDECTOMY  1975   TOTAL KNEE ARTHROPLASTY Right 2003   TOTAL KNEE REVISION Right 01/19/2022   Procedure: Right knee polyethylene revision;  Surgeon: Gaynelle Arabian, MD;  Location: [facility];  Service: Orthopedics;  Laterality: Right;   TRANSTHORACIC ECHOCARDIOGRAM  05/2018; 12/03/2019   (after MI secondary to arterial spasm): EF  45-50%; severe hypokinesis of apical anterolateral, inferolateral , anterior, and inferior LV myocardium. 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   Shortness of breath 04/19/2019   Healthcare maintenance 04/19/2019   Overweight (BMI 25.0-29.9) 02/19/2019   Ischemic cardiomyopathy 09/03/2018   Chest pain 07/11/2018   Coronary vasospasm ([place]) 07/11/2018   NSTEMI (non-ST elevated myocardial infarction) ([place]) 05/14/2018   Acute encephalopathy 11/19/2017   Hyponatremia 11/16/2017   Bipolar 1 disorder ([place]) 12/29/2016   Hypertension 12/29/2016   TIA (transient ischemic attack) 12/29/2016   Chronic pelvic pain in female 08/22/2016   Stage 2 chronic kidney disease 08/22/2016   Osteoporosis 09/30/2015   Health maintenance examination 06/11/2015   Suprapubic discomfort 06/05/2015   Urethral caruncle 06/05/2015   Infection of urinary tract 06/05/2015   TMJ (temporomandibular joint disorder) 02/25/2015   Right ankle injury 11/05/2014   Cervical spondylosis without myelopathy 12/19/2013   Sprain of neck 12/19/2013   Memory difficulties 12/19/2013   Postnasal drip 10/28/2013   Renal calculus 08/05/2013   Urinary frequency 08/01/2013   Constipation, chronic 07/29/2013   COPD (chronic obstructive pulmonary disease) ([place]) 06/30/2013   Abnormal chest x-ray 06/30/2013   Benign essential HTN 05/29/2013   Kidney stone on left side 05/29/2013   Hyperlipidemia 05/29/2013    PCP: Shawnie Dapper  REFERRING PROVIDER: Jearld Lesch  REFERRING DIAG: presence of Right artificial knee joint  THERAPY DIAG:  Acute pain of right knee  Other abnormalities of gait and mobility  Stiffness of right knee, not elsewhere classified  Rationale for Evaluation and Treatment:  Rehabilitation  ONSET DATE: 01/19/22  SUBJECTIVE:   SUBJECTIVE STATEMENT:  03/17/2022 No new complaints. Has been doing HEP.   Eval: Pt had R total knee revision surgery 10/18, discharged 10/19. Doing well, has been using RW. She is having a hard time with transportation to appointments. F/U with MD on 10/30.    PERTINENT HISTORY: Bipolar, TIA, Osteoporosis, CKD ,COPD,    PAIN:  Are you having pain? Yes: NPRS scale: 4-5 /10 Pain location: R knee Pain description: Sore, Tight,  Aggravating factors: Movement, bending, walking  Relieving factors: Rest, ice    PRECAUTIONS: Knee  WEIGHT BEARING RESTRICTIONS: No  FALLS:  Has patient fallen in last 6 months? No  PLOF: Independent  PATIENT GOALS:  Decreased pain, regain use of knee, walking.    OBJECTIVE:  updated 03/17/22  DIAGNOSTIC FINDINGS:   COGNITION: Overall cognitive status: Within functional limits for tasks assessed     SENSATION: WFL  EDEMA:    POSTURE: standing with R knee in slight  flexion   PALPATION:  well healed incision   LOWER EXTREMITY ROM:  Active ROM Right eval R 02/03/22 R 03/17/22  Hip flexion     Hip extension     Hip abduction     Hip adduction     Hip internal rotation     Hip external rotation     Knee flexion 105 113 126   Knee extension -5 -2 -2  Ankle dorsiflexion     Ankle plantarflexion     Ankle inversion     Ankle eversion      (Blank rows = not tested)  LOWER EXTREMITY MMT:  MMT Right eval Left eval R 03/17/22  Hip flexion   4+  Hip extension     Hip abduction   4+  Hip adduction     Hip internal rotation     Hip external rotation   5  Knee flexion 4  5  Knee extension 4-    Ankle dorsiflexion     Ankle plantarflexion     Ankle inversion     Ankle eversion      (Blank rows = not tested)   LOWER EXTREMITY SPECIAL TESTS:   GAIT:   TODAY'S TREATMENT:                                                                                                                               DATE:   03/17/22: Therapeutic Exercise: Aerobic:  Recumbent bike L2 x 7 min  Supine:  SLR 2 x 10 bil;    S/L hip abd x15 bil;  Seated:   Standing:  Marching, no UE support x 20;  hip abd 2x10 bil; Step ups 6 in x 10 bil;   step ups 6 in x 10 bil;  Stretches: Seated HSS 30 sec x 3 on R;    Manual Therapy:      PATIENT EDUCATION:  Education details: Reviewed final HEP Person educated: Patient Education method: Explanation, Demonstration, Tactile cues, Verbal cues, and Handouts Education comprehension: verbalized understanding, returned demonstration, verbal cues required, tactile cues required, and needs further education  HOME EXERCISE PROGRAM: Access Code: [email or web address]   ASSESSMENT:  CLINICAL IMPRESSION:  03/17/2022  Pt progressing well. Doing much better with safe direction changes, able to perform today with little/no cuing. She has much improved knee/LE strength and ability for functional activity. She has met goals, and is ready for d/c to HEP. Final HEP reviewed.  Eval: Patient presents with deficits from R total knee revision on 01/19/22. She has decreased ROM, and mild joint stiffness for flexion and extension. She has decreased strength in quad and LE. She has decreased ability for gait, stairs, and functional activities due to pain and deficit. Pt to benefit from skilled PT to improve deficits and return to PLOF.   OBJECTIVE IMPAIRMENTS: Abnormal gait, decreased activity tolerance, decreased balance, decreased knowledge of use of DME, decreased mobility, difficulty walking, decreased ROM, decreased strength, decreased safety awareness, hypomobility, increased muscle spasms, impaired flexibility, and pain.   ACTIVITY LIMITATIONS: lifting, bending, sitting, standing, squatting, stairs, transfers, bed mobility, bathing, and locomotion level  PARTICIPATION LIMITATIONS: meal prep, cleaning, laundry, driving, shopping, and community  activity  PERSONAL FACTORS:  none  are also affecting patient's functional outcome.   REHAB POTENTIAL: Good  CLINICAL DECISION MAKING: Stable/uncomplicated  EVALUATION COMPLEXITY: Low   GOALS: Goals reviewed with patient? Yes  SHORT TERM GOALS: Target date: 02/11/2022   Pt to be independent with initial HEP  Goal status: MET  2.  Pt to demo improved Flexion ROM by at least 10 deg, and at least 3 deg of extension  in R knee.    Goal status: MET    LONG TERM GOALS: Target date: 03/25/2022    Pt to be independent with final HEP  Goal status: MET   2.  Pt to demo full ROM of R knee, to improve ability for ambulation and IADLs.   Goal status: MET  3.  Pt to demo improved strength of R LE to at least 4+/5 to improve ability for walking, stairs, and IADLs.   Goal status: MET  4.  Pt to demo ability for independent ambulation with mechanics WNL for pt age, with LRAD, for community distances.   Goal status: MET  5.  Pt to demo ability for stair climbing, at least 5 steps, independently with 1 hand rail, to improve ability for community navigation.   Goal status: MET    PLAN:  PT FREQUENCY: 2x/week  PT DURATION: 8 weeks  PLANNED INTERVENTIONS: Therapeutic exercises, Therapeutic activity, Neuromuscular re-education, Balance training, Gait training, Patient/Family education, Self Care, Joint mobilization, Joint manipulation, Stair training, Orthotic/Fit training, DME instructions, Dry Needling, Electrical stimulation, Spinal manipulation, Spinal mobilization, Cryotherapy, Moist heat, Taping, Vasopneumatic device, Traction, Ultrasound, Ionotophoresis 29m/ml Dexamethasone, and Manual therapy  PLAN FOR NEXT SESSION:    LLyndee Hensen PT, DPT 1:20 PM  03/17/22  PHYSICAL THERAPY DISCHARGE SUMMARY  Visits from Start of Care: 12  Plan: Patient agrees to discharge.  Patient goals were met. Patient is being discharged due to meeting the stated rehab goals.      LLyndee Hensen PT, DPT 1:55 PM  03/17/22

## 2022-03-22 DIAGNOSIS — Z5189 Encounter for other specified aftercare: Secondary | ICD-10-CM | POA: Diagnosis not present

## 2022-03-23 ENCOUNTER — Other Ambulatory Visit: Payer: Self-pay | Admitting: Cardiology

## 2022-03-30 ENCOUNTER — Encounter: Payer: Self-pay | Admitting: Family Medicine

## 2022-03-30 ENCOUNTER — Ambulatory Visit (INDEPENDENT_AMBULATORY_CARE_PROVIDER_SITE_OTHER): Payer: Medicare Other | Admitting: Family Medicine

## 2022-03-30 VITALS — BP 120/76 | HR 67 | Temp 98.3°F | Ht 67.0 in | Wt 181.2 lb

## 2022-03-30 DIAGNOSIS — Z79899 Other long term (current) drug therapy: Secondary | ICD-10-CM | POA: Diagnosis not present

## 2022-03-30 DIAGNOSIS — I10 Essential (primary) hypertension: Secondary | ICD-10-CM | POA: Diagnosis not present

## 2022-03-30 DIAGNOSIS — F3177 Bipolar disorder, in partial remission, most recent episode mixed: Secondary | ICD-10-CM | POA: Diagnosis not present

## 2022-03-30 DIAGNOSIS — F5105 Insomnia due to other mental disorder: Secondary | ICD-10-CM

## 2022-03-30 DIAGNOSIS — D62 Acute posthemorrhagic anemia: Secondary | ICD-10-CM | POA: Diagnosis not present

## 2022-03-30 DIAGNOSIS — F411 Generalized anxiety disorder: Secondary | ICD-10-CM

## 2022-03-30 DIAGNOSIS — F99 Mental disorder, not otherwise specified: Secondary | ICD-10-CM

## 2022-03-30 LAB — BASIC METABOLIC PANEL
BUN: 17 mg/dL (ref 6–23)
CO2: 30 mEq/L (ref 19–32)
Calcium: 9.3 mg/dL (ref 8.4–10.5)
Chloride: 97 mEq/L (ref 96–112)
Creatinine, Ser: 1.05 mg/dL (ref 0.40–1.20)
GFR: 54.31 mL/min — ABNORMAL LOW (ref >60.00)
Glucose, Bld: 80 mg/dL (ref 70–99)
Potassium: 3.9 mEq/L (ref 3.5–5.1)
Sodium: 133 mEq/L — ABNORMAL LOW (ref 135–145)

## 2022-03-30 LAB — CBC
HCT: 32.3 % — ABNORMAL LOW (ref 36.0–46.0)
Hemoglobin: 11.2 g/dL — ABNORMAL LOW (ref 12.0–15.0)
MCHC: 34.5 g/dL (ref 30.0–36.0)
MCV: 89.3 fl (ref 78.0–100.0)
Platelets: 276 10*3/uL (ref 150.0–400.0)
RBC: 3.62 Mil/uL — ABNORMAL LOW (ref 3.87–5.11)
RDW: 13.1 % (ref 11.5–15.5)
WBC: 6.9 10*3/uL (ref 4.0–10.5)

## 2022-03-30 MED ORDER — BUPROPION HCL ER (XL) 150 MG PO TB24: 150.0000 mg | ORAL_TABLET | Freq: Every day | ORAL | 1 refills | Status: DC

## 2022-03-30 MED ORDER — CITALOPRAM HYDROBROMIDE 20 MG PO TABS: 20.0000 mg | ORAL_TABLET | Freq: Two times a day (BID) | ORAL | 1 refills | Status: DC

## 2022-03-30 MED ORDER — POTASSIUM CHLORIDE CRYS ER 20 MEQ PO TBCR: 20.0000 meq | EXTENDED_RELEASE_TABLET | Freq: Every day | ORAL | 1 refills | Status: DC

## 2022-03-30 MED ORDER — OXYCODONE HCL 5 MG PO TABS: ORAL_TABLET | ORAL | 0 refills | Status: DC

## 2022-03-30 NOTE — Progress Notes (Signed)
OFFICE VISIT  03/30/2022  CC: [facility] f/u  Patient is a 71 y.o. female who presents for 27-monthfollow-up bipolar disorder with generalized anxiety disorder as well as follow-up hypertension and insomnia.  INTERIM HX: Since I last saw her she got right total knee revision arthroplasty on 01/19/2022. Hemoglobin 11.4 prior to surgery, dropped to 9.7 the day after surgery. Also, COPD follow-up with Dr. MVaughan Browneron 03/07/2022, all stable, remains on Stiolto.  Overall KJuliann Pulseis doing pretty well.  She does go through increased sadness in the winter season but feels like she is dealing with it okay.  Also struggling to deal with a distant relationship with her daughter. No SI or HI. No mania. Anxiety level stable. Sleep is fairly good lately.  Has diffuse pain from osteoarthritis of multiple sites, particularly when the weather gets cooler and rainy or like now. Her main areas are her neck, shoulders, hips, knees, and hands and feet. She sees no redness or acute swelling of her joints, other than some swelling in her knee since her surgery. She thinks her surgery went pretty well and she continues to do PT. She says meloxicam had helped prior to her surgery but since surgery she has tried going on and off it and does not feel like there is any difference when she takes it. She did take oxycodone in the postoperative period, prescribed by her orthopedist, but has not been on this in quite a while.  ROS as above, plus--> no fevers, no CP, no SOB, no wheezing, no cough, no dizziness, no HAs, no rashes, no melena/hematochezia.  No polyuria or polydipsia.   No focal weakness, paresthesias, or tremors.  No acute vision or hearing abnormalities.  No dysuria or unusual/new urinary urgency or frequency.  No recent changes in lower legs. No n/v/d or abd pain.  No palpitations.    PMP AWARE reviewed today: most recent rx for alprazolam was filled 03/09/2022, # 972 rx by me.  Most recent oxycodone rx filled  01/26/22, #42, by ortho. Most recent prescription for Ambien was filled 01/05/2022, #90, prescription by me. No red flags.   Past Medical History:  Diagnosis Date   Alcoholism in remission ([facility]    states last alcohol 11 yrs ago   Anemia    Anxiety    Bipolar 1 disorder ([facility]    Admission for mania x 2 (most recent 11/2017)   Cancer ([facility]    Cervical spondylosis without myelopathy 12/19/2013   MRI 02/2014: multilevel DDD/spondylosis, not much change compared to prior MRI.  Pt not in favor of invasive therapy for her neck as of 04/2014.   Cholelithiasis    COPD (chronic obstructive pulmonary disease) (HCC)    Moderate: anoro started 04/2017 by pulm, pt symptomatically improved and PFTs stable at f/u 06/2017.   Coronary artery spasm ([facility] 07/11/2018   nitrates=HA. Amlodipine=intol swelling.  Changed to coreg 09/03/18 by cardiologist.   Depression    Dilutional hyponatremia    Endometritis 05/2017   possible; empiric tx with Flagyl by GYN.   Environmental allergies    Fibromyalgia    GERD (gastroesophageal reflux disease)    Hepatic steatosis 03/2019   u/s abd   Herpes zoster 08/07/2017   L scapular region   Hiatal hernia    History of adenomatous polyp of colon 04/2008; 08/2014   No high grade dysplasia: recall 5 yrs (Dr. SMichail Sermon ESadie HaberGI)   History of basal cell carcinoma excision    NOSE   History of Helicobacter  pylori infection 04/2008   +gastric biopsy (gastritis but no metaplasia, dysplasia, or malignancy identified)   History of kidney stones    History of TIA (transient ischemic attack)    secondary to HELLP syndrome 1994 post c/s--  residual memory loss   Hyperlipidemia    statin started after her NSTEMI: goal  LDL <70.   Hypertension    Internal hemorrhoids    Ischemic cardiomyopathy 05/2018   Echo EF 45-50% in context of NSTEMI from CA spasm. // Echo 8/21: 55-60, mild LVH, normal RVSF, RVSP 40, trivial MR   Left foot pain    Hallux deformity+adhesive capsulitis+  hammertoe:  Severe structural bunion deformity with hallux interphalangeus and severe arthritis of the second MPJ left foot--surgical repair/osteotomies by [name] 04/2017.   Leg pain    ABIs 11/2019: normal    Memory loss    Abnl MRI brain and CT brain c/w chronic microvascular ischemia.  Repeat MRI brain 02/2014 stable (Dr. Mayra Reel with no plan to f/u with neuro as of 04/2014)   NSTEMI (non-ST elevated myocardial infarction) ([place]) 05/2018   Echo EF 45-50% in context of NSTEMI from CA spasm.// Myoview 8/21: EF 66, no ischemia, low risk   Osteoarthritis of left wrist    STT and 1st [place] jt   Osteoporosis 05/2010   2012 'penia; 06/2015 'porosis:  prolia.  DEXA 09/2017 T-score -2.2 femur neck. 11/2021 T score -2.2. Rpt 2 yrs   Pelvic floor dysfunction    [facility] ([name])   Pneumonia    Postmenopausal vaginal bleeding 05/2017   x 1 small episode; GYN attempted endo bx but unable to penetrate cervix due to severe cerv stenosis (due to postmenopausal state + hx of LEEP).  Endo u/s showed thin endo lining.  Per GYN---no evidence of endomet pathology on w/u---obs as of 07/2017.   Pre-diabetes    Prediabetes 06/2016   Fasting gluc 105; HbA1c at that time was 6.0%.  A1c 6.2% 12/2017.  A1c 5.7% Feb 2023.   Recurrent kidney stones 08/2013   Left ureteral calculus: Perc nephr--cystoscopy w/ureteroscopy + laser for stone removal.  Residual asymptomatic left renal nephrolithiasis <63m present post-procedure.  Right sided hydronephrosis-persistent (on u/s)--urol ordered CT to further eval 08/2016: 1.6 cm nonobstructing stone lower pole left kidney, no hydro, plan for PCN extraction ([facility])   Shortness of breath    Sprain of neck 12/19/2013   Stroke (HCC)    TIA   TMJ (temporomandibular joint disorder)    USES  MOUTH GUARD   Toe fracture, left 12/2017   2nd toe prox phalanx (immobilization, Dr. RZadie Rhine   Tubular adenoma of colon    Vitamin D deficiency 05/2011   Weak urinary stream 07/2016    with elevated PVR and mild left hydronephrosis secondary to this ([facility] started flomax 0.'4mg'$  qd for this).    Past Surgical History:  Procedure Laterality Date   ABIs  11/29/2019   NORMAL   ANTERIOR CERVICAL DECOMP/DISCECTOMY FUSION  06/30/2009   C3 -- C5  AND EXPLORATION OF FUSION C5-7 W/  PLATE REMOVAL   ARTHRODESIS METATARSALPHALANGEAL JOINT (MTPJ) Left 07/08/2021   Procedure: Left hallux metatarsophalangeal joint arthrodesis;  Surgeon: HWylene Simmer MD;  [facility]  Service: Orthopedics;  Laterality: Left;   BACK SURGERY     BUNIONECTOMY Left    CARDIOVASCULAR STRESS TEST  12/03/2019   myoc perf im: NORMAL   CERVICAL FUSION  2002   anterior C5 -- C7   CERVICAL  SPINE SURGERY  08/27/1999     C6 -- T1  LAMINECTOMY/  DISKECTOMY   CESAREAN SECTION  1994   CHOLECYSTECTOMY N/A 03/06/2020   Procedure: LAPAROSCOPIC CHOLECYSTECTOMY;  Surgeon: Felicie Morn, MD;  Location: [place];  Service: General;  Laterality: N/A;   COLONOSCOPY W/ POLYPECTOMY  04/2008;08/2014   2016 tubular adenoma x 1; +diverticulosis and int/ext hemorrhoids.  06/2020 adenoma, recall 5 yrs.   CYSTOSCOPY WITH URETEROSCOPY AND STENT PLACEMENT Left 08/28/2013   Procedure: CYSTOSCOPY, RGP,  WITH URETEROSCOPY AND STENT REMOVAL;  Surgeon: [name], MD;  Location: [facility];  Service: Urology;  Laterality: Left;   DEXA  09/2017   T-score -2.2 femur neck: improved compared to 06/2015.  2021 DEXA T score -2.3.  11/2021 DEXA T score -2.2.   ESOPHAGOGASTRODUODENOSCOPY  2010; 08/2014   2016 +Candidal esophagitis; Mild chronic gastritis w/intestinal metaplasia--NEG H pylori, neg for eosinophilic esoph. 11/9379 esoph candidiasis (diflucan x 20d), o/w normal.   HAMMER TOE SURGERY Left 07/08/2021   Procedure: Second metatarsal head excision; revision Second hammertoe correction; 2-5 percutaneus flexor tenotomies;  Surgeon: Wylene Simmer, MD;  Location: [place];  Service: Orthopedics;  Laterality: Left;   HARDWARE REMOVAL Left 07/08/2021   Procedure: Removal of deep implants from hallux proximal phalanx and First and Second metatarsals;  Surgeon: Wylene Simmer, MD;  Location: [place];  Service: Orthopedics;  Laterality: Left;   HOLMIUM LASER APPLICATION Left 01/75/1025   Procedure: HOLMIUM LASER APPLICATION;  Surgeon: [name], MD;  Location: [facility];  Service: Urology;  Laterality: Left;   JOINT REPLACEMENT     LEFT HEART CATH AND CORONARY ANGIOGRAPHY N/A 05/15/2018   Evidence for spasm in distal LAD.  Mod RCA atherosclerosis, o/w no CAD.  Procedure: LEFT HEART CATH AND CORONARY ANGIOGRAPHY;  Surgeon: Leonie Man, MD;  Location: [place] [facility];  Service: Cardiovascular;  Laterality: N/A;   NEGATIVE SLEEP STUDY  04/01/2007   NEPHROLITHOTOMY Left 08/05/2013   Procedure: NEPHROLITHOTOMY PERCUTANEOUS;  Surgeon: [name], MD;  Location: [facility];  Service: Urology;  Laterality: Left;   POLYSOMNOGRAM  01/2019   NORMAL   POLYSOMNOGRAPHY  10/25/2018   Dr. Dion Saucier due to pt inadequat sleep time (83 min).   TONSILLECTOMY AND ADENOIDECTOMY  1975   TOTAL KNEE ARTHROPLASTY Right 2003   TOTAL KNEE REVISION Right 01/19/2022   Procedure: Right knee polyethylene revision;  Surgeon: Gaynelle Arabian, MD;  Location: [facility];  Service: Orthopedics;  Laterality: Right;   TRANSTHORACIC ECHOCARDIOGRAM  05/2018; 12/03/2019   (after MI secondary to arterial spasm): EF 45-50%; severe hypokinesis of apical anterolateral, inferolateral , anterior, and inferior LV myocardium. 11/2019 EF 55-60%, valves fine   ULTRASOUND EXAM,PELVIC COMPLETE ([facility] HX)  06/25/2015   NORMAL (done by GYN)    Outpatient Medications Prior to Visit  Medication Sig Dispense Refill   acetaminophen (TYLENOL) 500 MG tablet Take 500-1,000 mg by mouth daily as needed for mild pain or fever.     albuterol (VENTOLIN HFA) 108 (90  Base) MCG/ACT inhaler INHALE 2 PUFFS INTO THE LUNGS EVERY 4 HOURS AS NEEDED FOR WHEEZE OR FOR SHORTNESS OF BREATH (Patient taking differently: Inhale 2 puffs into the lungs every 6 (six) hours as needed for shortness of breath or wheezing.) 8.5 each 2   ALPRAZolam (XANAX) 0.5 MG tablet TAKE 1 TABLET BY MOUTH THREE TIMES A DAY AS NEEDED FOR ANXIETY (Patient taking differently: Take 0.5 mg by  mouth 3 (three) times daily as needed for anxiety.) 90 tablet 5   Ascorbic Acid (VITAMIN C PO) Take 1 tablet by mouth daily.     atorvastatin (LIPITOR) 80 MG tablet TAKE 1 TABLET BY MOUTH DAILY AT 6 PM. (Patient taking differently: Take 80 mg by mouth daily.) 90 tablet 3   Cholecalciferol (VITAMIN D-3 PO) Take 1 capsule by mouth daily.     famotidine (PEPCID) 40 MG tablet TAKE 1 TABLET BY MOUTH EVERY DAY (Patient taking differently: Take 40 mg by mouth daily as needed for heartburn.) 90 tablet 3   fluticasone (FLONASE) 50 MCG/ACT nasal spray SPRAY 2 SPRAYS INTO EACH NOSTRIL EVERY DAY (Patient taking differently: Place 2 sprays into both nostrils daily. SPRAY 2 SPRAYS INTO EACH NOSTRIL EVERY DAY) 48 mL 1   hydrochlorothiazide (HYDRODIURIL) 12.5 MG tablet Take 12.5 mg by mouth daily.     lamoTRIgine (LAMICTAL) 100 MG tablet Take 3 tablets (300 mg total) by mouth at bedtime. 270 tablet 3   losartan (COZAAR) 25 MG tablet TAKE 1 TABLET BY MOUTH EVERY DAY 90 tablet 1   MAGNESIUM OXIDE PO Take 1 tablet by mouth daily.     methocarbamol (ROBAXIN) 500 MG tablet Take 1 tablet (500 mg total) by mouth every 6 (six) hours as needed for muscle spasms. 40 tablet 0   metoprolol succinate (TOPROL-XL) 100 MG 24 hr tablet TAKE 1 TABLET BY MOUTH EVERY DAY WITH OR IMMEDIATELY FOLLOWING A MEAL 90 tablet 0   montelukast (SINGULAIR) 10 MG tablet TAKE 1 TABLET BY MOUTH EVERY DAY IN THE EVENING (Patient taking differently: Take 10 mg by mouth at bedtime.) 90 tablet 1   Multiple Vitamins-Minerals (ZINC PO) Take 1 tablet by mouth daily.      nitroGLYCERIN (NITROSTAT) 0.4 MG SL tablet Place 1 tablet (0.4 mg total) under the tongue every 5 (five) minutes x 3 doses as needed for chest pain. 25 tablet 3   pantoprazole (PROTONIX) 40 MG tablet Take 1 tablet (40 mg total) by mouth daily. Please schedule a yearly follow up for further refills. Thank you. (Patient taking differently: Take 40 mg by mouth daily.) 90 tablet 0   Tiotropium Bromide-Olodaterol (STIOLTO RESPIMAT) 2.5-2.5 MCG/ACT AERS INHALE 2 EACH INTO THE LUNGS DAILY. 4 g 11   zolpidem (AMBIEN) 10 MG tablet TAKE 1 TABLET BY MOUTH EVERYDAY AT BEDTIME (Patient taking differently: Take 10 mg by mouth at bedtime.) 90 tablet 1   oxyCODONE (OXY IR/ROXICODONE) 5 MG immediate release tablet Take 1-2 tablets (5-10 mg total) by mouth every 6 (six) hours as needed for moderate pain or severe pain. 42 tablet 0   buPROPion (WELLBUTRIN XL) 150 MG 24 hr tablet TAKE 1 TABLET BY MOUTH EVERY DAY 30 tablet 0   citalopram (CELEXA) 20 MG tablet TAKE 1 TABLET (20 MG TOTAL) BY MOUTH IN THE MORNING AND AT BEDTIME 60 tablet 0   potassium chloride SA (KLOR-CON M20) 20 MEQ tablet TAKE 1 TABLET BY MOUTH EVERY DAY 30 tablet 0   No facility-administered medications prior to visit.    Allergies  Allergen Reactions   Ceftin [Cefuroxime Axetil] Anaphylaxis and Swelling    Swelling of eyes and tongue   Cipro [Ciprofloxacin Hcl] Anaphylaxis and Swelling   Risperdal [Risperidone] Palpitations    Generalized weakness Malaise  Hypertension   Fosamax [Alendronate Sodium] Diarrhea   Morphine And Related Other (See Comments)    Hallucinations Disorientation    Prednisone Other (See Comments)    Hyperactivity   Roxicodone [Oxycodone]  Other (See Comments)    Previously addicted to medication   Seroquel [Quetiapine] Other (See Comments)    Oversedation     Review of Systems As per HPI  PE:    03/30/2022    1:07 PM 03/07/2022    1:29 PM 01/20/2022   10:22 AM  Vitals with BMI  Height '5\' 7"'$  '5\' 7"'$    Weight  181 lbs 3 oz 174 lbs 13 oz   BMI 56.31 49.70   Systolic 263 785 885  Diastolic 76 68 84  Pulse 67 66 76     Physical Exam  Gen: Alert, well appearing.  Patient is oriented to person, place, time, and situation. AFFECT: pleasant, lucid thought and speech. No further exam today.  LABS:  Last CBC Lab Results  Component Value Date   WBC 8.3 01/20/2022   HGB 9.7 (L) 01/20/2022   HCT 28.7 (L) 01/20/2022   MCV 93.2 01/20/2022   MCH 31.5 01/20/2022   RDW 12.9 01/20/2022   PLT 221 01/20/2022   Lab Results  Component Value Date   IRON 84 04/29/2014   FERRITIN 27.6 02/77/4128   Last metabolic panel Lab Results  Component Value Date   GLUCOSE 133 (H) 01/20/2022   NA 135 01/20/2022   K 4.2 01/20/2022   CL 104 01/20/2022   CO2 26 01/20/2022   BUN 10 01/20/2022   CREATININE 0.69 01/20/2022   GFRNONAA >60 01/20/2022   CALCIUM 8.7 (L) 01/20/2022   PROT 6.6 05/06/2021   ALBUMIN 4.6 05/06/2021   BILITOT 0.8 05/06/2021   ALKPHOS 55 05/06/2021   AST 23 05/06/2021   ALT 22 05/06/2021   ANIONGAP 5 01/20/2022   Last lipids Lab Results  Component Value Date   CHOL 151 05/06/2021   HDL 58.90 05/06/2021   LDLCALC 73 05/06/2021   LDLDIRECT 144.1 05/29/2013   TRIG 98.0 05/06/2021   CHOLHDL 3 05/06/2021   Last hemoglobin A1c Lab Results  Component Value Date   HGBA1C 5.5 01/06/2022   Last thyroid functions Lab Results  Component Value Date   TSH 1.55 02/19/2019   IMPRESSION AND PLAN:  #1 chronic pain syndrome. Osteoarthritis of multiple sites. NSAIDs stopped working for her. She has used oxycodone sparingly in the past and we will get her back on this and small quantities.  #30 of the 5 mg tabs prescribed today. Urine drug screen today.  2.  Generalized anxiety disorder. Doing well chronically on Xanax 0.5 mg 3 times daily.  #3 bipolar disorder, pretty stable. Continue Wellbutrin XL 150 mg a day, citalopram 20 mg a day, and Lamictal 300 mg a day.  #4 insomnia,  largely psychophysiologic. Continue Ambien 10 mg nightly.  #5 osteoarthritis right knee. She is status post recent revision total arthroplasty. Doing pretty well.  She had a mild postoperative anemia.  Recheck CBC today.  An After Visit Summary was printed and given to the patient.  FOLLOW UP: Return in about 3 months (around 06/29/2022) for routine chronic illness f/u. Next CPE 12/2022.  Signed:  Crissie Sickles, MD           03/30/2022

## 2022-04-01 LAB — DRUG MONITORING PANEL 376104, URINE
Alphahydroxyalprazolam: 113 ng/mL — ABNORMAL HIGH (ref <25)
Alphahydroxymidazolam: NEGATIVE ng/mL (ref <50)
Alphahydroxytriazolam: NEGATIVE ng/mL (ref <50)
Aminoclonazepam: NEGATIVE ng/mL (ref <25)
Amphetamines: NEGATIVE ng/mL (ref <500)
Barbiturates: NEGATIVE ng/mL (ref <300)
Benzodiazepines: POSITIVE ng/mL — AB (ref <100)
Cocaine Metabolite: NEGATIVE ng/mL (ref <150)
Desmethyltramadol: NEGATIVE ng/mL (ref <100)
Hydroxyethylflurazepam: NEGATIVE ng/mL (ref <50)
Lorazepam: NEGATIVE ng/mL (ref <50)
Nordiazepam: NEGATIVE ng/mL (ref <50)
Noroxycodone: 266 ng/mL — ABNORMAL HIGH (ref <50)
Opiates: NEGATIVE ng/mL (ref <100)
Oxazepam: NEGATIVE ng/mL (ref <50)
Oxycodone: 107 ng/mL — ABNORMAL HIGH (ref <50)
Oxycodone: POSITIVE ng/mL — AB (ref <100)
Oxymorphone: NEGATIVE ng/mL (ref <50)
Temazepam: NEGATIVE ng/mL (ref <50)
Tramadol: NEGATIVE ng/mL (ref <100)

## 2022-04-01 LAB — DM TEMPLATE

## 2022-04-04 ENCOUNTER — Other Ambulatory Visit: Payer: Self-pay | Admitting: Internal Medicine

## 2022-04-04 ENCOUNTER — Other Ambulatory Visit: Payer: Self-pay | Admitting: Family Medicine

## 2022-04-06 ENCOUNTER — Other Ambulatory Visit: Payer: Self-pay | Admitting: Family Medicine

## 2022-04-06 ENCOUNTER — Other Ambulatory Visit: Payer: Self-pay | Admitting: Internal Medicine

## 2022-04-18 ENCOUNTER — Other Ambulatory Visit: Payer: Self-pay | Admitting: Family Medicine

## 2022-04-19 NOTE — Telephone Encounter (Signed)
Requesting: alprazolam  Contract: 03/31/22 UDS: 03/31/22 Last Visit: 03/30/22 Next Visit: 06/29/22 Last Refill: 09/23/21 (90,5)  Please Advise. Med pending

## 2022-05-11 ENCOUNTER — Other Ambulatory Visit: Payer: Self-pay | Admitting: Family Medicine

## 2022-05-11 ENCOUNTER — Other Ambulatory Visit: Payer: Self-pay | Admitting: Internal Medicine

## 2022-05-18 ENCOUNTER — Ambulatory Visit: Payer: Medicare Other | Admitting: Physician Assistant

## 2022-05-20 ENCOUNTER — Other Ambulatory Visit: Payer: Self-pay | Admitting: Family Medicine

## 2022-05-20 ENCOUNTER — Other Ambulatory Visit: Payer: Self-pay | Admitting: Internal Medicine

## 2022-05-26 ENCOUNTER — Telehealth: Payer: Self-pay | Admitting: Internal Medicine

## 2022-05-26 NOTE — Telephone Encounter (Signed)
Patient called states she is having BM changes and constipation. Seeking advise.

## 2022-05-27 NOTE — Telephone Encounter (Signed)
Left message for pt to call back  °

## 2022-06-03 NOTE — Telephone Encounter (Signed)
Pt never returned phone call 

## 2022-06-16 ENCOUNTER — Other Ambulatory Visit: Payer: Self-pay | Admitting: Family Medicine

## 2022-06-16 ENCOUNTER — Other Ambulatory Visit: Payer: Self-pay | Admitting: Cardiology

## 2022-06-16 ENCOUNTER — Telehealth: Payer: Self-pay | Admitting: Cardiology

## 2022-06-16 NOTE — Telephone Encounter (Addendum)
Patient states she would like an ultrasound on her carotid artery. She states she was diagnosed with atherosclerosis when she was 73 something an would like to check on the arteries in her neck. She wants to make sure the oxygen is flowing properly to her brain. She feels she has been having trouble remembering and feels its cause her oxygen is not flowing properly.Patient denies any chest pain or discomfort, shortness of breath or dizziness at this time. States she only have shortness of breath when she is walking a long distance. Her oxygen level  currently at 94. Informed patient to express concerns of memory loss to her PCP.

## 2022-06-16 NOTE — Telephone Encounter (Signed)
Please fill, if appropriate.  

## 2022-06-16 NOTE — Telephone Encounter (Signed)
Patient is requesting an order for a carotid ultrasound. She would like a call back to discuss.

## 2022-06-17 NOTE — Telephone Encounter (Signed)
Called patient to discuss Dr. Landis Gandy recommendation that we can try to get a carotid ultrasound, though insurance may not cover. Discussed with patient that Dr. Radford Pax also says that a statin holiday for 2 weeks may also help to determine if a change needs to be made to medication regimen due to side effects. Patient states she has not seen a neurologist in many years for her known chronic microvascular ischemia that showed up on prior MRI/CT of brain but does have a visit with her PCP later this month. She would like to talk to her PCP about seeing a neurologist again before she does a statin holiday. Patient states she will call us back to discuss statin holiday further depending on the outcome of further workup.

## 2022-06-20 ENCOUNTER — Other Ambulatory Visit: Payer: Self-pay | Admitting: Internal Medicine

## 2022-06-20 ENCOUNTER — Other Ambulatory Visit: Payer: Self-pay | Admitting: Family Medicine

## 2022-06-20 NOTE — Telephone Encounter (Signed)
Medication showing as discontinued.  Please advise if refill appropriate, med pending

## 2022-06-20 NOTE — Telephone Encounter (Signed)
She should no longer be on the valsartan 50 mg dose.  Please confirm with patient that she is only taking a 25 mg valsartan tab once daily.

## 2022-06-21 NOTE — Telephone Encounter (Signed)
Pt confirmed she is taking 25mg  valsartan, she requested a refill in error for Losartan.

## 2022-06-23 ENCOUNTER — Telehealth: Payer: Self-pay | Admitting: Pulmonary Disease

## 2022-06-23 NOTE — Telephone Encounter (Signed)
Patient called stated her new insurance [employer] is not covering her Stiolto inhaler. Patient is requesting a letter to be sent to [facility] so she can keep receiving the [place]. She doesn't want to switch inhalers this works really well for her. She had previously tried Anoro and this cause some side effects.  Dr. Vaughan Browner please advise

## 2022-06-24 NOTE — Telephone Encounter (Signed)
Letter has been printed and signed by Dr. Alexander Bergeron and spoke with patient and she is aware of this and that I have faxed the letter to [facility]. She verbalized understanding.   Nothing further needed at time of call.

## 2022-06-24 NOTE — Telephone Encounter (Signed)
Okay to send letter to [facility] that she should keep receiving the [place].  She had tried alternate inhalers such as Anoro and could not tolerate it due to side effects

## 2022-06-29 ENCOUNTER — Ambulatory Visit: Payer: Medicare Other | Admitting: Family Medicine

## 2022-07-02 ENCOUNTER — Other Ambulatory Visit: Payer: Self-pay | Admitting: Family Medicine

## 2022-07-04 NOTE — Telephone Encounter (Signed)
Requesting: zolpidem Contract: 03/30/22 UDS: 03/30/22 Last Visit: 03/30/22 Next Visit: 07/06/22 Last Refill: 01/05/22 (90,1)  Please Advise. Med pending

## 2022-07-06 ENCOUNTER — Ambulatory Visit (INDEPENDENT_AMBULATORY_CARE_PROVIDER_SITE_OTHER): Payer: Medicare HMO | Admitting: Family Medicine

## 2022-07-06 ENCOUNTER — Encounter: Payer: Self-pay | Admitting: Family Medicine

## 2022-07-06 VITALS — BP 137/82 | HR 67 | Wt 176.0 lb

## 2022-07-06 DIAGNOSIS — K582 Mixed irritable bowel syndrome: Secondary | ICD-10-CM | POA: Diagnosis not present

## 2022-07-06 DIAGNOSIS — F411 Generalized anxiety disorder: Secondary | ICD-10-CM | POA: Diagnosis not present

## 2022-07-06 DIAGNOSIS — Z79899 Other long term (current) drug therapy: Secondary | ICD-10-CM

## 2022-07-06 DIAGNOSIS — G894 Chronic pain syndrome: Secondary | ICD-10-CM | POA: Diagnosis not present

## 2022-07-06 DIAGNOSIS — F3177 Bipolar disorder, in partial remission, most recent episode mixed: Secondary | ICD-10-CM | POA: Diagnosis not present

## 2022-07-06 DIAGNOSIS — F5105 Insomnia due to other mental disorder: Secondary | ICD-10-CM | POA: Diagnosis not present

## 2022-07-06 DIAGNOSIS — F99 Mental disorder, not otherwise specified: Secondary | ICD-10-CM

## 2022-07-06 DIAGNOSIS — M159 Polyosteoarthritis, unspecified: Secondary | ICD-10-CM | POA: Diagnosis not present

## 2022-07-06 MED ORDER — MONTELUKAST SODIUM 10 MG PO TABS: 10.0000 mg | ORAL_TABLET | Freq: Every evening | ORAL | 1 refills | Status: DC

## 2022-07-06 MED ORDER — FLUCONAZOLE 150 MG PO TABS: 150.0000 mg | ORAL_TABLET | Freq: Every day | ORAL | 0 refills | Status: DC

## 2022-07-06 NOTE — Progress Notes (Signed)
OFFICE VISIT  07/06/2022  CC:  Chief Complaint  Patient presents with   Follow-up    3 month follow up. She has been having stomach issues.    Patient is a 72 y.o. female who presents for 70-month follow-up chronic pain syndrome, GAD, bipolar disorder, and insomnia. A/P as of last visit: "#1 chronic pain syndrome. Osteoarthritis of multiple sites. NSAIDs stopped working for her. She has used oxycodone sparingly in the past and we will get her back on this and small quantities.  #30 of the 5 mg tabs prescribed today. Urine drug screen today.   2.  Generalized anxiety disorder. Doing well chronically on Xanax 0.5 mg 3 times daily.   #3 bipolar disorder, pretty stable. Continue Wellbutrin XL 150 mg a day, citalopram 20 mg a day, and Lamictal 300 mg a day.   #4 insomnia, largely psychophysiologic. Continue Ambien 10 mg nightly.   #5 osteoarthritis right knee. She is status post recent revision total arthroplasty. Doing pretty well.  She had a mild postoperative anemia.  Recheck CBC today."  INTERIM HX: C/o chronic constipation alt w/ diarrhea.  Bloated. Intermittent pain/spasm in R groin area but no abd pain, no BRBPR or melena. Probiotic helpful for a month or so.  Has some vaginal itching but no d/c.  Says "red" in vag/introitus.  PMP AWARE reviewed today: most recent rx for alprazolam was filled 06/20/2022, # 90, rx by me. Most recent Ambien prescription filled 04/04/2022, #90, refill by me. Most recent oxycodone prescription filled 03/30/2022, #30, prescription by me. No red flags.  Past Medical History:  Diagnosis Date   Alcoholism in remission    states last alcohol 11 yrs ago   Anemia    Anxiety    Bipolar 1 disorder    Admission for mania x 2 (most recent 11/2017)   Cancer    Cervical spondylosis without myelopathy 12/19/2013   MRI 02/2014: multilevel DDD/spondylosis, not much change compared to prior MRI.  Pt not in favor of invasive therapy for her neck as of  04/2014.   Cholelithiasis    COPD (chronic obstructive pulmonary disease)    Moderate: anoro started 04/2017 by pulm, pt symptomatically improved and PFTs stable at f/u 06/2017.   Coronary artery spasm 07/11/2018   nitrates=HA. Amlodipine=intol swelling.  Changed to coreg 09/03/18 by cardiologist.   Depression    Dilutional hyponatremia    Endometritis 05/2017   possible; empiric tx with Flagyl by GYN.   Environmental allergies    Fibromyalgia    GERD (gastroesophageal reflux disease)    Hepatic steatosis 03/2019   u/s abd   Herpes zoster 08/07/2017   L scapular region   Hiatal hernia    History of adenomatous polyp of colon 04/2008; 08/2014   No high grade dysplasia: recall 5 yrs (Dr. Bosie Clos, [facility])   History of basal cell carcinoma excision    NOSE   History of Helicobacter pylori infection 04/2008   +gastric biopsy (gastritis but no metaplasia, dysplasia, or malignancy identified)   History of kidney stones    History of TIA (transient ischemic attack)    secondary to HELLP syndrome 1994 post c/s--  residual memory loss   Hyperlipidemia    statin started after her NSTEMI: goal  LDL <70.   Hypertension    Internal hemorrhoids    Ischemic cardiomyopathy 05/2018   Echo EF 45-50% in context of NSTEMI from CA spasm. // Echo 8/21: 55-60, mild LVH, normal RVSF, RVSP 40, trivial MR  Left foot pain    Hallux deformity+adhesive capsulitis+ hammertoe:  Severe structural bunion deformity with hallux interphalangeus and severe arthritis of the second MPJ left foot--surgical repair/osteotomies by Dr. Charlsie Merles 04/2017.   Leg pain    ABIs 11/2019: normal    Memory loss    Abnl MRI brain and CT brain c/w chronic microvascular ischemia.  Repeat MRI brain 02/2014 stable (Dr. Radonna Ricker with no plan to f/u with neuro as of 04/2014)   NSTEMI (non-ST elevated myocardial infarction) 05/2018   Echo EF 45-50% in context of NSTEMI from CA spasm.// Myoview 8/21: EF 66, no ischemia, low risk    Osteoarthritis of left wrist    STT and 1st [facility] jt   Osteoporosis 05/2010   2012 'penia; 06/2015 'porosis:  prolia.  DEXA 09/2017 T-score -2.2 femur neck. 11/2021 T score -2.2. Rpt 2 yrs   Pelvic floor dysfunction    [facility] (Dr. Marlou Porch)   Pneumonia    Postmenopausal vaginal bleeding 05/2017   x 1 small episode; GYN attempted endo bx but unable to penetrate cervix due to severe cerv stenosis (due to postmenopausal state + hx of LEEP).  Endo u/s showed thin endo lining.  Per GYN---no evidence of endomet pathology on w/u---obs as of 07/2017.   Pre-diabetes    Prediabetes 06/2016   Fasting gluc 105; HbA1c at that time was 6.0%.  A1c 6.2% 12/2017.  A1c 5.7% Feb 2023.   Recurrent kidney stones 08/2013   Left ureteral calculus: Perc nephr--cystoscopy w/ureteroscopy + laser for stone removal.  Residual asymptomatic left renal nephrolithiasis <64mm present post-procedure.  Right sided hydronephrosis-persistent (on u/s)--urol ordered CT to further eval 08/2016: 1.6 cm nonobstructing stone lower pole left kidney, no hydro, plan for PCN extraction ([facility])   Shortness of breath    Sprain of neck 12/19/2013   Stroke    TIA   TMJ (temporomandibular joint disorder)    USES  MOUTH GUARD   Toe fracture, left 12/2017   2nd toe prox phalanx (immobilization, Dr. Luciana Axe)   Tubular adenoma of colon    Vitamin D deficiency 05/2011   Weak urinary stream 07/2016   with elevated PVR and mild left hydronephrosis secondary to this ([facility] started flomax 0.4mg  qd for this).    Past Surgical History:  Procedure Laterality Date   ABIs  11/29/2019   NORMAL   ANTERIOR CERVICAL DECOMP/DISCECTOMY FUSION  06/30/2009   C3 -- C5  AND EXPLORATION OF FUSION C5-7 W/  PLATE REMOVAL   ARTHRODESIS METATARSALPHALANGEAL JOINT (MTPJ) Left 07/08/2021   Procedure: Left hallux metatarsophalangeal joint arthrodesis;  Surgeon: Toni Arthurs, MD;  Location: [facility];  Service: Orthopedics;  Laterality: Left;    BACK SURGERY     BUNIONECTOMY Left    CARDIOVASCULAR STRESS TEST  12/03/2019   myoc perf im: NORMAL   CERVICAL FUSION  2002   anterior C5 -- C7   CERVICAL SPINE SURGERY  08/27/1999     C6 -- T1  LAMINECTOMY/  DISKECTOMY   CESAREAN SECTION  1994   CHOLECYSTECTOMY N/A 03/06/2020   Procedure: LAPAROSCOPIC CHOLECYSTECTOMY;  Surgeon: Quentin Ore, MD;  Location: [facility];  Service: General;  Laterality: N/A;   COLONOSCOPY W/ POLYPECTOMY  04/2008;08/2014   2016 tubular adenoma x 1; +diverticulosis and int/ext hemorrhoids.  06/2020 adenoma, recall 5 yrs.   CYSTOSCOPY WITH URETEROSCOPY AND STENT PLACEMENT Left 08/28/2013   Procedure: CYSTOSCOPY, RGP,  WITH URETEROSCOPY AND STENT REMOVAL;  Surgeon: Valetta Fuller, MD;  Location: [facility];  Service: Urology;  Laterality: Left;   DEXA  09/2017   T-score -2.2 femur neck: improved compared to 06/2015.  2021 DEXA T score -2.3.  11/2021 DEXA T score -2.2.   ESOPHAGOGASTRODUODENOSCOPY  2010; 08/2014   2016 +Candidal esophagitis; Mild chronic gastritis w/intestinal metaplasia--NEG H pylori, neg for eosinophilic esoph. 06/2020 esoph candidiasis (diflucan x 20d), o/w normal.   HAMMER TOE SURGERY Left 07/08/2021   Procedure: Second metatarsal head excision; revision Second hammertoe correction; 2-5 percutaneus flexor tenotomies;  Surgeon: Toni Arthurs, MD;  Location: [facility];  Service: Orthopedics;  Laterality: Left;   HARDWARE REMOVAL Left 07/08/2021   Procedure: Removal of deep implants from hallux proximal phalanx and First and Second metatarsals;  Surgeon: Toni Arthurs, MD;  Location: [facility];  Service: Orthopedics;  Laterality: Left;   HOLMIUM LASER APPLICATION Left 08/28/2013   Procedure: HOLMIUM LASER APPLICATION;  Surgeon: Valetta Fuller, MD;  Location: [facility];  Service: Urology;  Laterality: Left;   JOINT REPLACEMENT     LEFT HEART CATH AND CORONARY ANGIOGRAPHY N/A 05/15/2018    Evidence for spasm in distal LAD.  Mod RCA atherosclerosis, o/w no CAD.  Procedure: LEFT HEART CATH AND CORONARY ANGIOGRAPHY;  Surgeon: Marykay Lex, MD;  Location: [facility];  Service: Cardiovascular;  Laterality: N/A;   NEGATIVE SLEEP STUDY  04/01/2007   NEPHROLITHOTOMY Left 08/05/2013   Procedure: NEPHROLITHOTOMY PERCUTANEOUS;  Surgeon: Valetta Fuller, MD;  Location: [facility];  Service: Urology;  Laterality: Left;   POLYSOMNOGRAM  01/2019   NORMAL   POLYSOMNOGRAPHY  10/25/2018   Dr. Luetta Nutting due to pt inadequat sleep time (83 min).   TONSILLECTOMY AND ADENOIDECTOMY  1975   TOTAL KNEE ARTHROPLASTY Right 2003   TOTAL KNEE REVISION Right 01/19/2022   Procedure: Right knee polyethylene revision;  Surgeon: Ollen Gross, MD;  Location: [facility];  Service: Orthopedics;  Laterality: Right;   TRANSTHORACIC ECHOCARDIOGRAM  05/2018; 12/03/2019   (after MI secondary to arterial spasm): EF 45-50%; severe hypokinesis of apical anterolateral, inferolateral , anterior, and inferior LV myocardium. 11/2019 EF 55-60%, valves fine   ULTRASOUND EXAM,PELVIC COMPLETE ([facility] HX)  06/25/2015   NORMAL (done by GYN)    Outpatient Medications Prior to Visit  Medication Sig Dispense Refill   acetaminophen (TYLENOL) 500 MG tablet Take 500-1,000 mg by mouth daily as needed for mild pain or fever.     albuterol (VENTOLIN HFA) 108 (90 Base) MCG/ACT inhaler INHALE 2 PUFFS INTO THE LUNGS EVERY 4 HOURS AS NEEDED FOR WHEEZE OR FOR SHORTNESS OF BREATH (Patient taking differently: Inhale 2 puffs into the lungs every 6 (six) hours as needed for shortness of breath or wheezing.) 8.5 each 2   ALPRAZolam (XANAX) 0.5 MG tablet TAKE 1 TABLET BY MOUTH THREE TIMES A DAY AS NEEDED FOR ANXIETY 90 tablet 5   Ascorbic Acid (VITAMIN C PO) Take 1 tablet by mouth daily.     atorvastatin (LIPITOR) 80 MG tablet TAKE 1 TABLET BY MOUTH DAILY AT 6 PM. (Patient taking differently: Take 80 mg by mouth daily.) 90 tablet  3   buPROPion (WELLBUTRIN XL) 150 MG 24 hr tablet Take 1 tablet (150 mg total) by mouth daily. 90 tablet 1   Cholecalciferol (VITAMIN D-3 PO) Take 1 capsule by mouth daily.     citalopram (CELEXA) 20 MG tablet Take 1 tablet (20 mg total) by mouth in the morning and at bedtime. 180 tablet 1  famotidine (PEPCID) 40 MG tablet TAKE 1 TABLET BY MOUTH EVERY DAY (Patient taking differently: Take 40 mg by mouth daily as needed for heartburn.) 90 tablet 3   fluticasone (FLONASE) 50 MCG/ACT nasal spray SPRAY 2 SPRAYS INTO EACH NOSTRIL EVERY DAY (Patient taking differently: Place 2 sprays into both nostrils daily. SPRAY 2 SPRAYS INTO EACH NOSTRIL EVERY DAY) 48 mL 1   hydrochlorothiazide (HYDRODIURIL) 12.5 MG tablet TAKE 1 TABLET BY MOUTH EVERY DAY 90 tablet 1   lamoTRIgine (LAMICTAL) 100 MG tablet TAKE 3 TABLETS (300 MG TOTAL) BY MOUTH AT BEDTIME 90 tablet 6   losartan (COZAAR) 25 MG tablet TAKE 1 TABLET BY MOUTH EVERY DAY 90 tablet 1   MAGNESIUM OXIDE PO Take 1 tablet by mouth daily.     meloxicam (MOBIC) 15 MG tablet 1 tab po qd AS NEEDED for joint pain 30 tablet 1   methocarbamol (ROBAXIN) 500 MG tablet Take 1 tablet (500 mg total) by mouth every 6 (six) hours as needed for muscle spasms. 40 tablet 0   metoprolol succinate (TOPROL-XL) 100 MG 24 hr tablet TAKE 1 TABLET BY MOUTH EVERY DAY WITH OR IMMEDIATELY FOLLOWING A MEAL 90 tablet 1   montelukast (SINGULAIR) 10 MG tablet TAKE 1 TABLET BY MOUTH EVERY DAY IN THE EVENING (Patient taking differently: Take 10 mg by mouth at bedtime.) 90 tablet 1   Multiple Vitamins-Minerals (ZINC PO) Take 1 tablet by mouth daily.     nitroGLYCERIN (NITROSTAT) 0.4 MG SL tablet Place 1 tablet (0.4 mg total) under the tongue every 5 (five) minutes x 3 doses as needed for chest pain. 25 tablet 3   oxyCODONE (OXY IR/ROXICODONE) 5 MG immediate release tablet 1 tab po tid prn severe pain 30 tablet 0   pantoprazole (PROTONIX) 40 MG tablet Take 1 tablet (40 mg total) by mouth daily.  Please schedule a yearly follow up for further refills. Thank you. (Patient taking differently: Take 40 mg by mouth daily.) 90 tablet 0   potassium chloride SA (KLOR-CON M20) 20 MEQ tablet Take 1 tablet (20 mEq total) by mouth daily. 90 tablet 1   Tiotropium Bromide-Olodaterol (STIOLTO RESPIMAT) 2.5-2.5 MCG/ACT AERS INHALE 2 EACH INTO THE LUNGS DAILY. 4 g 11   zolpidem (AMBIEN) 10 MG tablet TAKE 1 TABLET BY MOUTH EVERYDAY AT BEDTIME 90 tablet 1   No facility-administered medications prior to visit.    Allergies  Allergen Reactions   Ceftin [Cefuroxime Axetil] Anaphylaxis and Swelling    Swelling of eyes and tongue   Cipro [Ciprofloxacin Hcl] Anaphylaxis and Swelling   Risperdal [Risperidone] Palpitations    Generalized weakness Malaise  Hypertension   Fosamax [Alendronate Sodium] Diarrhea   Morphine And Related Other (See Comments)    Hallucinations Disorientation    Prednisone Other (See Comments)    Hyperactivity   Roxicodone [Oxycodone] Other (See Comments)    Previously addicted to medication   Seroquel [Quetiapine] Other (See Comments)    Oversedation     Review of Systems As per HPI  PE:    07/06/2022    3:39 PM 03/30/2022    1:07 PM 03/07/2022    1:29 PM  Vitals with BMI  Height  5\' 7"  5\' 7"   Weight 176 lbs 181 lbs 3 oz 174 lbs 13 oz  BMI  28.37 27.37  Systolic 137 120 161  Diastolic 82 76 68  Pulse 67 67 66     Physical Exam  Gen: Alert, well appearing.  Patient is oriented to person, place,  time, and situation. AFFECT: pleasant, lucid thought and speech. No further exam today.  LABS:  Last CBC Lab Results  Component Value Date   WBC 6.9 03/30/2022   HGB 11.2 (L) 03/30/2022   HCT 32.3 (L) 03/30/2022   MCV 89.3 03/30/2022   MCH 31.5 01/20/2022   RDW 13.1 03/30/2022   PLT 276.0 03/30/2022   Lab Results  Component Value Date   IRON 84 04/29/2014   FERRITIN 27.6 04/29/2014   Last metabolic panel Lab Results  Component Value Date   GLUCOSE 80  03/30/2022   NA 133 (L) 03/30/2022   K 3.9 03/30/2022   CL 97 03/30/2022   CO2 30 03/30/2022   BUN 17 03/30/2022   CREATININE 1.05 03/30/2022   GFRNONAA >60 01/20/2022   CALCIUM 9.3 03/30/2022   PROT 6.6 05/06/2021   ALBUMIN 4.6 05/06/2021   BILITOT 0.8 05/06/2021   ALKPHOS 55 05/06/2021   AST 23 05/06/2021   ALT 22 05/06/2021   ANIONGAP 5 01/20/2022   Last lipids Lab Results  Component Value Date   CHOL 151 05/06/2021   HDL 58.90 05/06/2021   LDLCALC 73 05/06/2021   LDLDIRECT 144.1 05/29/2013   TRIG 98.0 05/06/2021   CHOLHDL 3 05/06/2021   Last hemoglobin A1c Lab Results  Component Value Date   HGBA1C 5.5 01/06/2022   Last thyroid functions Lab Results  Component Value Date   TSH 1.55 02/19/2019   IMPRESSION AND PLAN:  1) IBS, fairly unchanged/fair control. Cont dietary mod, avoid NSAIDs.  +Fiber. Watch for wt loss or other red flag sx's.  2) chronic pain syndrome--stable. Osteoarthritis of multiple sites. NSAIDs stopped working for her. She has used oxycodone sparingly in the past and we will get her back on this and small quantities.  CSC and UDS UTD.  3) Bipolar d/o and GAD as well as insomnia. Waxing and waning sx's but overall ok. No changes today-->Lamictal 300 qd, citalopram 20 qhs, wellbutrin xl 150 qd, and xanax 0.5 tid. Also ambien 10 qhs.  An After Visit Summary was printed and given to the patient.  FOLLOW UP: 3 mo RCI Next cpe 12/2022 Signed:  Santiago Bumpers, MD           07/06/2022

## 2022-07-19 ENCOUNTER — Telehealth: Payer: Self-pay | Admitting: Family Medicine

## 2022-07-19 NOTE — Telephone Encounter (Signed)
Pt advised AWV can be completed before or after appt with provider.

## 2022-07-19 NOTE — Telephone Encounter (Signed)
Can someone do annual wellness visit with her same day I see her next?

## 2022-07-19 NOTE — Telephone Encounter (Signed)
Patient is requesting an [facility] AWV when she sees Dr Milinda Cave in July, 2024.  Thank you, Gabriel Cirri [facility] AWV TEAM Direct Dial [phone]

## 2022-07-22 ENCOUNTER — Other Ambulatory Visit: Payer: Self-pay | Admitting: Family Medicine

## 2022-07-22 ENCOUNTER — Other Ambulatory Visit: Payer: Self-pay | Admitting: Internal Medicine

## 2022-07-22 NOTE — Telephone Encounter (Signed)
Online controlled substance prescribing database shows that Ambien was filled 07/04/22, #90. Pls clarify with pt.

## 2022-07-22 NOTE — Telephone Encounter (Signed)
Refill requested for Ambien. Pt uses the [facility] on [address]. Next OV 7/10.

## 2022-07-22 NOTE — Telephone Encounter (Signed)
Pt will check with the pharmacy to see if the meds are there.

## 2022-08-16 ENCOUNTER — Other Ambulatory Visit: Payer: Self-pay | Admitting: Family Medicine

## 2022-08-26 ENCOUNTER — Encounter: Payer: Self-pay | Admitting: Family Medicine

## 2022-08-26 ENCOUNTER — Ambulatory Visit (INDEPENDENT_AMBULATORY_CARE_PROVIDER_SITE_OTHER): Payer: Medicare HMO | Admitting: Family Medicine

## 2022-08-26 VITALS — BP 133/78 | HR 68 | Temp 97.8°F | Ht 67.0 in | Wt 178.2 lb

## 2022-08-26 DIAGNOSIS — J449 Chronic obstructive pulmonary disease, unspecified: Secondary | ICD-10-CM

## 2022-08-26 DIAGNOSIS — I10 Essential (primary) hypertension: Secondary | ICD-10-CM | POA: Diagnosis not present

## 2022-08-26 DIAGNOSIS — J018 Other acute sinusitis: Secondary | ICD-10-CM | POA: Diagnosis not present

## 2022-08-26 MED ORDER — AMOXICILLIN-POT CLAVULANATE 875-125 MG PO TABS
1.0000 | ORAL_TABLET | Freq: Two times a day (BID) | ORAL | 0 refills | Status: DC
Start: 2022-08-26 — End: 2022-10-27

## 2022-08-26 NOTE — Progress Notes (Signed)
   Erin Lopez is a 72 y.o. female who presents today for an office visit.  Assessment/Plan:  New/Acute Problems: Sinusitis  No red flags.  No signs of systemic illness.  Given length of symptoms would be reasonable to start antibiotics at this point.  She has tolerated Augmentin well in the past.  We will send this in today.  Encouraged hydration.  She can continue using over-the-counter meds as needed.  She will let us know if not improving.  Chronic Problems Addressed Today: COPD Mild flare due to above respiratory infection.  Will be starting Augmentin.  Cannot tolerate prednisone due to side effects.  She has overall reassuring lung exam today without any signs of respiratory distress.  As above she will let us know if symptoms do not improve  Essential Hypertension  Blood pressure at goal today on metoprolol succinate 100 mg daily HCTZ 12.5 mg daily, and losartan 25 mg daily.    Subjective:  HPI:  PAtient here with sneezing, cough, congestion, watery left eye, and cough. This started a few weeks ago. Seems to be getting worse. Does not feel like allergies. Tried Flonase and netti pot without much improvement.  No fevers or chills. Some body aches. Some worsening shortness of breath.       Objective:  Physical Exam: BP 133/78   Pulse 68   Temp 97.8 F (36.6 C) (Temporal)   Ht 5\' 7"  (1.702 m)   Wt 178 lb 3.2 oz (80.8 kg)   SpO2 95%   BMI 27.91 kg/m   Gen: No acute distress, resting comfortably HEENT: TMs clear.  Bilateral mild scleral erythema with watery discharge noted CV: Regular rate and rhythm with no murmurs appreciated Pulm: Normal work of breathing, mild and expiratory wheezes at bilateral bases with faint rhonchi. Neuro: Grossly normal, moves all extremities Psych: Normal affect and thought content      Mariadel Mruk M. Jimmey Ralph, MD 08/26/2022 9:08 AM

## 2022-08-26 NOTE — Patient Instructions (Signed)
It was very nice to see you today!  You have a sinus infection.   Return for Annual Physical.   Take care, Dr Jimmey Ralph  PLEASE NOTE:  If you had any lab tests, please let us know if you have not heard back within a few days. You may see your results on mychart before we have a chance to review them but we will give you a call once they are reviewed by [place].   If we ordered any referrals today, please let us know if you have not heard from their office within the next week.   If you had any urgent prescriptions sent in today, please check with the pharmacy within an hour of our visit to make sure the prescription was transmitted appropriately.   Please try these tips to maintain a healthy lifestyle:  Eat at least 3 REAL meals and 1-2 snacks per day.  Aim for no more than 5 hours between eating.  If you eat breakfast, please do so within one hour of getting up.   Each meal should contain half fruits/vegetables, one quarter protein, and one quarter carbs (no bigger than a computer mouse)  Cut down on sweet beverages. This includes juice, soda, and sweet tea.   Drink at least 1 glass of water with each meal and aim for at least 8 glasses per day  Exercise at least 150 minutes every week.

## 2022-09-12 ENCOUNTER — Other Ambulatory Visit: Payer: Self-pay | Admitting: Family Medicine

## 2022-09-17 ENCOUNTER — Other Ambulatory Visit: Payer: Self-pay | Admitting: Cardiology

## 2022-10-10 NOTE — Patient Instructions (Signed)

## 2022-10-12 ENCOUNTER — Encounter: Payer: Self-pay | Admitting: Family Medicine

## 2022-10-12 ENCOUNTER — Ambulatory Visit: Payer: Medicare HMO | Admitting: Family Medicine

## 2022-10-12 VITALS — BP 130/80 | HR 63 | Wt 172.0 lb

## 2022-10-12 DIAGNOSIS — F411 Generalized anxiety disorder: Secondary | ICD-10-CM

## 2022-10-12 DIAGNOSIS — F3178 Bipolar disorder, in full remission, most recent episode mixed: Secondary | ICD-10-CM

## 2022-10-12 DIAGNOSIS — Z79899 Other long term (current) drug therapy: Secondary | ICD-10-CM

## 2022-10-12 DIAGNOSIS — R5382 Chronic fatigue, unspecified: Secondary | ICD-10-CM | POA: Diagnosis not present

## 2022-10-12 DIAGNOSIS — R6889 Other general symptoms and signs: Secondary | ICD-10-CM

## 2022-10-12 DIAGNOSIS — I10 Essential (primary) hypertension: Secondary | ICD-10-CM

## 2022-10-12 DIAGNOSIS — L659 Nonscarring hair loss, unspecified: Secondary | ICD-10-CM

## 2022-10-12 DIAGNOSIS — F5105 Insomnia due to other mental disorder: Secondary | ICD-10-CM

## 2022-10-12 MED ORDER — FAMOTIDINE 40 MG PO TABS: 40.0000 mg | ORAL_TABLET | Freq: Every day | ORAL | 3 refills | Status: DC

## 2022-10-12 MED ORDER — POTASSIUM CHLORIDE CRYS ER 20 MEQ PO TBCR: 20.0000 meq | EXTENDED_RELEASE_TABLET | Freq: Every day | ORAL | 1 refills | Status: DC

## 2022-10-12 MED ORDER — CITALOPRAM HYDROBROMIDE 20 MG PO TABS: 20.0000 mg | ORAL_TABLET | Freq: Two times a day (BID) | ORAL | 1 refills | Status: DC

## 2022-10-12 MED ORDER — BUPROPION HCL ER (XL) 150 MG PO TB24: 150.0000 mg | ORAL_TABLET | Freq: Every day | ORAL | 1 refills | Status: DC

## 2022-10-12 MED ORDER — ATORVASTATIN CALCIUM 80 MG PO TABS: ORAL_TABLET | ORAL | 3 refills | Status: DC

## 2022-10-12 MED ORDER — MELOXICAM 15 MG PO TABS: ORAL_TABLET | ORAL | 1 refills | Status: DC

## 2022-10-12 MED ORDER — ALPRAZOLAM 0.5 MG PO TABS: ORAL_TABLET | ORAL | 5 refills | Status: DC

## 2022-10-12 NOTE — Progress Notes (Signed)
OFFICE VISIT  10/12/2022  CC:  Chief Complaint  Patient presents with   Medical Management of Chronic Issues    Patient is a 72 y.o. female who presents for 35-month follow-up hypertension, chronic pain syndrome, GAD, bipolar disorder, and insomnia. A/P as of last visit: "1) IBS, fairly unchanged/fair control. Cont dietary mod, avoid NSAIDs.  +Fiber. Watch for wt loss or other red flag sx's.   2) chronic pain syndrome--stable. Osteoarthritis of multiple sites. NSAIDs stopped working for her. She has used oxycodone sparingly in the past and we will get her back on this and small quantities.  CSC and UDS UTD.   3) Bipolar d/o and GAD as well as insomnia. Waxing and waning sx's but overall ok. No changes today-->Lamictal 300 qd, citalopram 20 qhs, wellbutrin xl 150 qd, and xanax 0.5 tid. Also ambien 10 qhs."  INTERIM HX: Doing ok overall. Says not cycling but poor focus/distracted a lot. No prolonged/severe depressed mood. Has hair loss, chronic fatigue, cold intol.  No home bp monitoring.  Anxiety stable, sleeps fine as long as she takes [place]. PMP AWARE reviewed today: most recent rx for alprazolam was filled 09/19/2022, # 90, rx by me. Most recent Ambien prescription filled for 124, #90, prescription by me. Most recent oxycodone prescription filled 03/30/2022, #30, prescription by me. No red flags.  ROS as above, plus--> no fevers, no CP, no SOB, no wheezing, no cough, no dizziness, no HAs, no rashes, no melena/hematochezia.  No polyuria or polydipsia.  No myalgias or arthralgias.  No focal weakness, paresthesias, or tremors.  No acute vision or hearing abnormalities.  No dysuria or unusual/new urinary urgency or frequency.  No recent changes in lower legs. No n/v/d or abd pain.  No palpitations.     Past Medical History:  Diagnosis Date   Alcoholism in remission [facility])    states last alcohol 11 yrs ago   Anemia    Anxiety    Bipolar 1 disorder (HCC)    Admission for  mania x 2 (most recent 11/2017)   Cancer [facility])    Cervical spondylosis without myelopathy 12/19/2013   MRI 02/2014: multilevel DDD/spondylosis, not much change compared to prior MRI.  Pt not in favor of invasive therapy for her neck as of 04/2014.   Cholelithiasis    COPD (chronic obstructive pulmonary disease) (HCC)    Moderate: anoro started 04/2017 by pulm, pt symptomatically improved and PFTs stable at f/u 06/2017.   Coronary artery spasm (HCC) 07/11/2018   nitrates=HA. Amlodipine=intol swelling.  Changed to coreg 09/03/18 by cardiologist.   Depression    Dilutional hyponatremia    Endometritis 05/2017   possible; empiric tx with Flagyl by GYN.   Environmental allergies    Fibromyalgia    GERD (gastroesophageal reflux disease)    Hepatic steatosis 03/2019   u/s abd   Herpes zoster 08/07/2017   L scapular region   Hiatal hernia    History of adenomatous polyp of colon 04/2008; 08/2014   No high grade dysplasia: recall 5 yrs (Dr. Bosie Clos, [facility])   History of basal cell carcinoma excision    NOSE   History of Helicobacter pylori infection 04/2008   +gastric biopsy (gastritis but no metaplasia, dysplasia, or malignancy identified)   History of kidney stones    History of TIA (transient ischemic attack)    secondary to HELLP syndrome 1994 post c/s--  residual memory loss   Hyperlipidemia    statin started after her NSTEMI: goal  LDL <70.  Hypertension    Internal hemorrhoids    Ischemic cardiomyopathy 05/2018   Echo EF 45-50% in context of NSTEMI from CA spasm. 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 Charlsie Merles 04/2017.   Leg pain    ABIs 11/2019: normal    Memory loss    Abnl MRI brain and CT brain c/w chronic microvascular ischemia.  Repeat MRI  brain 02/2014 stable (Dr. Radonna Ricker with no plan to f/u with neuro as of 04/2014)   NSTEMI (non-ST elevated myocardial infarction) (HCC) 05/2018   Echo EF 45-50% in context of NSTEMI from CA spasm.// Myoview 8/21: EF 66, no ischemia, low risk   Osteoarthritis of left wrist    STT and 1st [facility] jt   Osteoporosis 05/2010   2012 'penia; 06/2015 'porosis:  prolia.  DEXA 09/2017 T-score -2.2 femur neck. 11/2021 T score -2.2. Rpt 2 yrs   Pelvic floor dysfunction    [facility] (Dr. Marlou Porch)   Pneumonia    Postmenopausal vaginal bleeding 05/2017   x 1 small episode; GYN attempted endo bx but unable to penetrate cervix due to severe cerv stenosis (due to postmenopausal state + hx of LEEP).  Endo u/s showed thin endo lining.  Per GYN---no evidence of endomet pathology on w/u---obs as of 07/2017.   Pre-diabetes    Prediabetes 06/2016   Fasting gluc 105; HbA1c at that time was 6.0%.  A1c 6.2% 12/2017.  A1c 5.7% Feb 2023.   Recurrent kidney stones 08/2013   Left ureteral calculus: Perc nephr--cystoscopy w/ureteroscopy + laser for stone removal.  Residual asymptomatic left renal nephrolithiasis <83mm present post-procedure.  Right sided hydronephrosis-persistent (on u/s)--urol ordered CT to further eval 08/2016: 1.6 cm nonobstructing stone lower pole left kidney, no hydro, plan for PCN extraction ([facility])   Shortness of breath    Sprain of neck 12/19/2013   Stroke (HCC)    TIA   TMJ (temporomandibular joint disorder)    USES  MOUTH GUARD   Toe fracture, left 12/2017   2nd toe prox phalanx (immobilization, Dr. Luciana Axe)   Tubular adenoma of colon    Vitamin D deficiency 05/2011   Weak urinary stream 07/2016   with elevated PVR and mild left hydronephrosis secondary to this ([facility] started flomax 0.4mg  qd for this).    Past Surgical History:  Procedure Laterality Date   ABIs  11/29/2019   NORMAL   ANTERIOR CERVICAL DECOMP/DISCECTOMY FUSION  06/30/2009   C3 -- C5  AND EXPLORATION OF FUSION  C5-7 W/  PLATE REMOVAL   ARTHRODESIS METATARSALPHALANGEAL JOINT (MTPJ) Left 07/08/2021   Procedure: Left hallux metatarsophalangeal joint arthrodesis;  Surgeon: Toni Arthurs, MD;  Location: [facility];  Service: Orthopedics;  Laterality: Left;   BACK SURGERY     BUNIONECTOMY Left    CARDIOVASCULAR STRESS TEST  12/03/2019   myoc perf im: NORMAL   CERVICAL FUSION  2002   anterior C5 -- C7   CERVICAL SPINE SURGERY  08/27/1999     C6 -- T1  LAMINECTOMY/  DISKECTOMY   CESAREAN SECTION  1994   CHOLECYSTECTOMY N/A 03/06/2020   Procedure: LAPAROSCOPIC CHOLECYSTECTOMY;  Surgeon: Quentin Ore, MD;  Location: [facility];  Service: General;  Laterality: N/A;   COLONOSCOPY W/ POLYPECTOMY  04/2008;08/2014   2016 tubular adenoma x 1; +  diverticulosis and int/ext hemorrhoids.  06/2020 adenoma, recall 5 yrs.   CYSTOSCOPY WITH URETEROSCOPY AND STENT PLACEMENT Left 08/28/2013   Procedure: CYSTOSCOPY, RGP,  WITH URETEROSCOPY AND STENT REMOVAL;  Surgeon: Valetta Fuller, MD;  Location: [facility];  Service: Urology;  Laterality: Left;   DEXA  09/2017   T-score -2.2 femur neck: improved compared to 06/2015.  2021 DEXA T score -2.3.  11/2021 DEXA T score -2.2.   ESOPHAGOGASTRODUODENOSCOPY  2010; 08/2014   2016 +Candidal esophagitis; Mild chronic gastritis w/intestinal metaplasia--NEG H pylori, neg for eosinophilic esoph. 06/2020 esoph candidiasis (diflucan x 20d), o/w normal.   HAMMER TOE SURGERY Left 07/08/2021   Procedure: Second metatarsal head excision; revision Second hammertoe correction; 2-5 percutaneus flexor tenotomies;  Surgeon: Toni Arthurs, MD;  Location: [facility];  Service: Orthopedics;  Laterality: Left;   HARDWARE REMOVAL Left 07/08/2021   Procedure: Removal of deep implants from hallux proximal phalanx and First and Second metatarsals;  Surgeon: Toni Arthurs, MD;  Location: [facility];  Service: Orthopedics;  Laterality: Left;   HOLMIUM  LASER APPLICATION Left 08/28/2013   Procedure: HOLMIUM LASER APPLICATION;  Surgeon: Valetta Fuller, MD;  Location: [facility];  Service: Urology;  Laterality: Left;   JOINT REPLACEMENT     LEFT HEART CATH AND CORONARY ANGIOGRAPHY N/A 05/15/2018   Evidence for spasm in distal LAD.  Mod RCA atherosclerosis, o/w no CAD.  Procedure: LEFT HEART CATH AND CORONARY ANGIOGRAPHY;  Surgeon: Marykay Lex, MD;  Location: [facility];  Service: Cardiovascular;  Laterality: N/A;   NEGATIVE SLEEP STUDY  04/01/2007   NEPHROLITHOTOMY Left 08/05/2013   Procedure: NEPHROLITHOTOMY PERCUTANEOUS;  Surgeon: Valetta Fuller, MD;  Location: [facility];  Service: Urology;  Laterality: Left;   POLYSOMNOGRAM  01/2019   NORMAL   POLYSOMNOGRAPHY  10/25/2018   Dr. Luetta Nutting due to pt inadequat sleep time (83 min).   TONSILLECTOMY AND ADENOIDECTOMY  1975   TOTAL KNEE ARTHROPLASTY Right 2003   TOTAL KNEE REVISION Right 01/19/2022   Procedure: Right knee polyethylene revision;  Surgeon: Ollen Gross, MD;  Location: [facility];  Service: Orthopedics;  Laterality: Right;   TRANSTHORACIC ECHOCARDIOGRAM  05/2018; 12/03/2019   (after MI secondary to arterial spasm): EF 45-50%; severe hypokinesis of apical anterolateral, inferolateral , anterior, and inferior LV myocardium. 11/2019 EF 55-60%, valves fine   ULTRASOUND EXAM,PELVIC COMPLETE ([facility] HX)  06/25/2015   NORMAL (done by GYN)    Outpatient Medications Prior to Visit  Medication Sig Dispense Refill   acetaminophen (TYLENOL) 500 MG tablet Take 500-1,000 mg by mouth daily as needed for mild pain or fever.     albuterol (VENTOLIN HFA) 108 (90 Base) MCG/ACT inhaler INHALE 2 PUFFS INTO THE LUNGS EVERY 4 HOURS AS NEEDED FOR WHEEZE OR FOR SHORTNESS OF BREATH (Patient taking differently: Inhale 2 puffs into the lungs every 6 (six) hours as needed for shortness of breath or wheezing.) 8.5 each 2   ALPRAZolam (XANAX) 0.5 MG tablet TAKE 1 TABLET BY  MOUTH THREE TIMES A DAY AS NEEDED FOR ANXIETY 90 tablet 5   Ascorbic Acid (VITAMIN C PO) Take 1 tablet by mouth daily.     atorvastatin (LIPITOR) 80 MG tablet TAKE 1 TABLET BY MOUTH DAILY AT 6 PM. (Patient taking differently: Take 80 mg by mouth daily.) 90 tablet 3   buPROPion (WELLBUTRIN XL) 150 MG 24 hr tablet Take 1 tablet (150 mg total) by mouth daily. 90 tablet 1  Cholecalciferol (VITAMIN D-3 PO) Take 1 capsule by mouth daily.     citalopram (CELEXA) 20 MG tablet Take 1 tablet (20 mg total) by mouth in the morning and at bedtime. 180 tablet 1   famotidine (PEPCID) 40 MG tablet TAKE 1 TABLET BY MOUTH EVERY DAY (Patient taking differently: Take 40 mg by mouth daily as needed for heartburn.) 90 tablet 3   fluticasone (FLONASE) 50 MCG/ACT nasal spray SPRAY 2 SPRAYS INTO EACH NOSTRIL EVERY DAY 16 g 3   hydrochlorothiazide (HYDRODIURIL) 12.5 MG tablet TAKE 1 TABLET BY MOUTH EVERY DAY 90 tablet 1   lamoTRIgine (LAMICTAL) 100 MG tablet TAKE 3 TABLETS (300 MG TOTAL) BY MOUTH AT BEDTIME 90 tablet 6   losartan (COZAAR) 25 MG tablet TAKE 1 TABLET BY MOUTH EVERY DAY 90 tablet 0   MAGNESIUM OXIDE PO Take 1 tablet by mouth daily.     meloxicam (MOBIC) 15 MG tablet TAKE 1 TABLET BY MOUTH ONCE DAILY AS NEEDED FOR JOINT PAIN 30 tablet 1   methocarbamol (ROBAXIN) 500 MG tablet Take 1 tablet (500 mg total) by mouth every 6 (six) hours as needed for muscle spasms. 40 tablet 0   metoprolol succinate (TOPROL-XL) 100 MG 24 hr tablet TAKE 1 TABLET BY MOUTH EVERY DAY WITH OR IMMEDIATELY FOLLOWING A MEAL 90 tablet 1   montelukast (SINGULAIR) 10 MG tablet Take 1 tablet (10 mg total) by mouth every evening. 90 tablet 1   Multiple Vitamins-Minerals (ZINC PO) Take 1 tablet by mouth daily.     nitroGLYCERIN (NITROSTAT) 0.4 MG SL tablet Place 1 tablet (0.4 mg total) under the tongue every 5 (five) minutes x 3 doses as needed for chest pain. 25 tablet 3   oxyCODONE (OXY IR/ROXICODONE) 5 MG immediate release tablet 1 tab po  tid prn severe pain 30 tablet 0   pantoprazole (PROTONIX) 40 MG tablet TAKE 1 TABLET (40 MG TOTAL) BY MOUTH DAILY. PLEASE SCHEDULE A YEARLY FOLLOW UP FOR FURTHER REFILLS. THANK YOU. 90 tablet 0   potassium chloride SA (KLOR-CON M20) 20 MEQ tablet Take 1 tablet (20 mEq total) by mouth daily. 90 tablet 1   Tiotropium Bromide-Olodaterol (STIOLTO RESPIMAT) 2.5-2.5 MCG/ACT AERS INHALE 2 EACH INTO THE LUNGS DAILY. 4 g 11   zolpidem (AMBIEN) 10 MG tablet TAKE 1 TABLET BY MOUTH EVERYDAY AT BEDTIME 90 tablet 1   amoxicillin-clavulanate (AUGMENTIN) 875-125 MG tablet Take 1 tablet by mouth 2 (two) times daily. (Patient not taking: Reported on 10/12/2022) 20 tablet 0   No facility-administered medications prior to visit.    Allergies  Allergen Reactions   Ceftin [Cefuroxime Axetil] Anaphylaxis and Swelling    Swelling of eyes and tongue   Cipro [Ciprofloxacin Hcl] Anaphylaxis and Swelling   Risperdal [Risperidone] Palpitations    Generalized weakness Malaise  Hypertension   Fosamax [Alendronate Sodium] Diarrhea   Morphine And Codeine Other (See Comments)    Hallucinations Disorientation    Prednisone Other (See Comments)    Hyperactivity   Roxicodone [Oxycodone] Other (See Comments)    Previously addicted to medication   Seroquel [Quetiapine] Other (See Comments)    Oversedation     Review of Systems As per HPI  PE:    10/12/2022    3:47 PM 10/12/2022    3:39 PM 08/26/2022    8:45 AM  Vitals with BMI  Height   5\' 7"   Weight  172 lbs 178 lbs 3 oz  BMI   27.9  Systolic 130 145 409  Diastolic 80  84 78  Pulse  63 68     Physical Exam  Gen: Alert, well appearing.  Patient is oriented to person, place, time, and situation. AFFECT: pleasant, lucid thought and speech. No further exam today  LABS:  Last CBC Lab Results  Component Value Date   WBC 6.9 03/30/2022   HGB 11.2 (L) 03/30/2022   HCT 32.3 (L) 03/30/2022   MCV 89.3 03/30/2022   MCH 31.5 01/20/2022   RDW 13.1 03/30/2022    PLT 276.0 03/30/2022   Last metabolic panel Lab Results  Component Value Date   GLUCOSE 80 03/30/2022   NA 133 (L) 03/30/2022   K 3.9 03/30/2022   CL 97 03/30/2022   CO2 30 03/30/2022   BUN 17 03/30/2022   CREATININE 1.05 03/30/2022   GFR 54.31 (L) 03/30/2022   CALCIUM 9.3 03/30/2022   PROT 6.6 05/06/2021   ALBUMIN 4.6 05/06/2021   BILITOT 0.8 05/06/2021   ALKPHOS 55 05/06/2021   AST 23 05/06/2021   ALT 22 05/06/2021   ANIONGAP 5 01/20/2022   Last lipids Lab Results  Component Value Date   CHOL 151 05/06/2021   HDL 58.90 05/06/2021   LDLCALC 73 05/06/2021   LDLDIRECT 144.1 05/29/2013   TRIG 98.0 05/06/2021   CHOLHDL 3 05/06/2021   Last hemoglobin A1c Lab Results  Component Value Date   HGBA1C 5.5 01/06/2022   Last thyroid functions Lab Results  Component Value Date   TSH 1.55 02/19/2019   Last vitamin B12 and Folate Lab Results  Component Value Date   VITAMINB12 688 12/11/2017   IMPRESSION AND PLAN:  1) GAD. Stable on alpraz 0.5mg  tid, citalopram 20 every day and wellbutrin xl 150mg  every day.  2) Insomnia, doing well on ambien 10mg  every day.  3) Bipolar disorder. Stable on lamictal 300 every day, cital 20 every day, wellbutrin xl 150 every day.  4) HTN, stable on hydrochlorothiazide 12.5 mg every day, losartan 25 every day, toprol xl 100 every day. BMET today.  5) Chronic fatigue, cold intol, hair loss. TSH today.  6) Insomnia, continue ambien 10mg  at bedtime.  An After Visit Summary was printed and given to the patient.  FOLLOW UP: 3 mo cpe  Signed:  Santiago Bumpers, MD           10/12/2022

## 2022-10-13 LAB — BASIC METABOLIC PANEL
BUN/Creatinine Ratio: 16 (calc) (ref 6–22)
BUN: 17 mg/dL (ref 7–25)
CO2: 23 mmol/L (ref 20–32)
Calcium: 9.6 mg/dL (ref 8.6–10.4)
Chloride: 100 mmol/L (ref 98–110)
Creat: 1.06 mg/dL — ABNORMAL HIGH (ref 0.50–1.05)
Glucose, Bld: 120 mg/dL — ABNORMAL HIGH (ref 65–99)
Potassium: 4 mmol/L (ref 3.5–5.3)
Sodium: 135 mmol/L (ref 135–146)

## 2022-10-13 LAB — TSH: TSH: 0.91 mIU/L (ref 0.40–4.50)

## 2022-10-17 ENCOUNTER — Telehealth: Payer: Self-pay | Admitting: Family Medicine

## 2022-10-17 NOTE — Telephone Encounter (Signed)
Patient called about results. I informed her of the results per Dr. Milinda Cave, however I told her that a CMA would also be calling. She did not have any additional questions or concerns.

## 2022-10-19 ENCOUNTER — Other Ambulatory Visit: Payer: Self-pay | Admitting: Cardiology

## 2022-10-19 ENCOUNTER — Other Ambulatory Visit: Payer: Self-pay | Admitting: Internal Medicine

## 2022-10-19 ENCOUNTER — Other Ambulatory Visit: Payer: Self-pay | Admitting: Family Medicine

## 2022-10-21 ENCOUNTER — Other Ambulatory Visit: Payer: Self-pay | Admitting: Internal Medicine

## 2022-10-25 ENCOUNTER — Telehealth: Payer: Self-pay

## 2022-10-25 MED ORDER — ZOLPIDEM TARTRATE 10 MG PO TABS: ORAL_TABLET | ORAL | 1 refills | Status: DC

## 2022-10-25 MED ORDER — ALPRAZOLAM 0.5 MG PO TABS: ORAL_TABLET | ORAL | 5 refills | Status: DC

## 2022-10-25 NOTE — Telephone Encounter (Signed)
Rxs sent

## 2022-10-25 NOTE — Telephone Encounter (Signed)
Pt aware.

## 2022-10-25 NOTE — Telephone Encounter (Signed)
Patient is stating that pharmacy told her they do not have new prescriptions for the following meds,  ALPRAZolam (XANAX) 0.5 MG tablet  zolpidem (AMBIEN) 10 MG tablet   Patient has changed preferred pharmacy.  [id number] [place], [place] - [address]  Please follow up with patient [phone]

## 2022-10-27 ENCOUNTER — Other Ambulatory Visit: Payer: Self-pay | Admitting: Internal Medicine

## 2022-10-27 ENCOUNTER — Encounter: Payer: Self-pay | Admitting: Internal Medicine

## 2022-10-27 ENCOUNTER — Ambulatory Visit: Payer: Medicare HMO | Admitting: Internal Medicine

## 2022-10-27 ENCOUNTER — Other Ambulatory Visit: Payer: Medicare HMO

## 2022-10-27 VITALS — BP 132/68 | HR 64 | Ht 67.5 in | Wt 171.0 lb

## 2022-10-27 DIAGNOSIS — K219 Gastro-esophageal reflux disease without esophagitis: Secondary | ICD-10-CM

## 2022-10-27 DIAGNOSIS — R140 Abdominal distension (gaseous): Secondary | ICD-10-CM

## 2022-10-27 DIAGNOSIS — K5909 Other constipation: Secondary | ICD-10-CM | POA: Diagnosis not present

## 2022-10-27 MED ORDER — LUBIPROSTONE 8 MCG PO CAPS: 8.0000 ug | ORAL_CAPSULE | Freq: Two times a day (BID) | ORAL | 11 refills | Status: DC

## 2022-10-27 NOTE — Patient Instructions (Addendum)
Your provider has requested that you go to the basement level for lab work before leaving today. Press "B" on the elevator. The lab is located at the first door on the left as you exit the elevator.  We have sent the following medications to your pharmacy for you to pick up at your convenience: Amitiza 8 mcg twice daily with meals.  Continue pantoprazole daily and famotidine at bedtime.  _______________________________________________________  If your blood pressure at your visit was 140/90 or greater, please contact your primary care physician to follow up on this.  _______________________________________________________  If you are age 59 or older, your body mass index should be between 23-30. Your Body mass index is 26.39 kg/m. If this is out of the aforementioned range listed, please consider follow up with your Primary Care Provider.  If you are age 109 or younger, your body mass index should be between 19-25. Your Body mass index is 26.39 kg/m. If this is out of the aformentioned range listed, please consider follow up with your Primary Care Provider.   ________________________________________________________  The [facility] providers would like to encourage you to use [facility] to communicate with providers for non-urgent requests or questions.  Due to long hold times on the telephone, sending your provider a message by [facility] may be a faster and more efficient way to get a response.  Please allow 48 business hours for a response.  Please remember that this is for non-urgent requests.  _______________________________________________________

## 2022-10-27 NOTE — Progress Notes (Signed)
   Subjective:    Patient ID: Erin Lopez, female    DOB: September 12, 1950, 72 y.o.   MRN: [id number]  HPI Erin Lopez is a 72 year old female with a history of GERD, gastritis with intestinal metaplasia, adenomatous colon polyps, family history of esophageal cancer in her sister, prior alcohol abuse in remission, history of anxiety and depression, cholecystectomy for gallstones, prior endometriosis who is here for follow-up and to discuss constipation.  She is here alone today.  She was last here for upper and lower endoscopy which was performed on 07/01/2020 EGD revealed Candida esophagitis but was otherwise normal Colonoscopy revealed a 4 mm adenomatous colon polyp removed from the descending colon.  Left-sided diverticulosis.  She reports that she deals with frequent abdominal bloating and constipation.  She takes MiraLAX as many as 4 times a day but this still does not give her complete bowel movements.  She is lost about 10 pounds by reducing "junk food in her diet.  Occasional hemorrhoidal discomfort but not bleeding.  She started a pre and probiotic called Seed.  She is trying to eat more fruits and vegetables in her foods.  Her heartburn and dyspeptic symptoms have resolved with the pantoprazole 40 mg daily.  She will occasionally use famotidine in the evening if she has breakthrough heartburn.  No recent dysphagia or odynophagia.   Review of Systems As per HPI, otherwise negative  Current Medications, Allergies, Past Medical History, Past Surgical History, Family History and Social History were reviewed in [employer] record.    Objective:   Physical Exam BP 132/68   Pulse 64   Ht 5' 7.5" (1.715 m)   Wt 171 lb (77.6 kg)   SpO2 96%   BMI 26.39 kg/m  Gen: awake, alert, NAD HEENT: anicteric  CV: RRR, no mrg Pulm: CTA b/l Abd: soft, NT/ND, +BS throughout Ext: no c/c/e Neuro: nonfocal     Assessment & Plan:  72 year old female with a history of GERD,  gastritis with intestinal metaplasia, adenomatous colon polyps, family history of esophageal cancer in her sister, prior alcohol abuse in remission, history of anxiety and depression, cholecystectomy for gallstones, prior endometriosis who is here for follow-up and to discuss constipation.   Chronic constipation and abdominal bloating --bloating likely secondary to constipation and possibly worse with MiraLAX.  Cannot exclude SIBO but would like to treat constipation first.  She is concerned about parasites. -- Stool for ova and parasite -- Can continue pre and probiotic for now -- Discontinue MiraLAX -- Amitiza 8 mcg twice daily; may need dose titration or possibly changed to a different medication -- She is asked to notify me based on response to Amitiza and if bloating improves.  If not could consider SIBO testing  2.  History of colon polyps --surveillance colonoscopy recommended April 2029  3.  GERD --continue pantoprazole 40 mg daily, okay for famotidine 20 to 40 mg in the evening as needed for breakthrough symptoms  30 minutes total spent today including patient facing time, coordination of care, reviewing medical history/procedures/pertinent radiology studies, and documentation of the encounter.

## 2022-10-31 ENCOUNTER — Other Ambulatory Visit (HOSPITAL_COMMUNITY): Payer: Self-pay

## 2022-10-31 NOTE — Telephone Encounter (Signed)
Current formulary alternative is Linzess. Please advise if medication change is appropriate.

## 2022-11-01 NOTE — Telephone Encounter (Signed)
Explained to patient that her insurance would not cover Amitiza and the formulary alternative is Linzess. Patient verbalized understanding. Informed patient that I will send in the lowest dose of Linzess but if we need to titrate up we can. Informed patient to let us know if this medication is not effective or causes any side effects. Patient verbalized understanding.

## 2022-11-01 NOTE — Telephone Encounter (Signed)
Amitizia is not on patient's formulary. The formulary alternative is Linzess. Dr. Rhea Belton, is this appropriate to switch patient to?

## 2022-11-01 NOTE — Telephone Encounter (Signed)
Please explain to patient Amitiza not covered by insurance Okay to try Linzess 72 mcg daily, may need dose titration and she should try this for several days and let us know if ineffective or if she is having side effects such as abdominal pain or diarrhea

## 2022-11-08 ENCOUNTER — Other Ambulatory Visit: Payer: Self-pay | Admitting: Family Medicine

## 2022-11-14 ENCOUNTER — Telehealth: Payer: Self-pay

## 2022-11-14 NOTE — Telephone Encounter (Signed)
Received fax from [facility] of approval of lubiprostone which is a non-formulary medication.   The approval authorizes coverage from 05/05/22-04-04-23.

## 2022-11-15 ENCOUNTER — Telehealth: Payer: Self-pay | Admitting: Pharmacy Technician

## 2022-11-15 ENCOUNTER — Other Ambulatory Visit: Payer: Self-pay | Admitting: Internal Medicine

## 2022-11-15 NOTE — Telephone Encounter (Signed)
APPROVAL 05/05/2022 TO 04/04/2023

## 2022-11-15 NOTE — Telephone Encounter (Signed)
Pharmacy Patient Advocate Encounter   Received notification from CoverMyMeds that prior authorization for LUBIPROSTONE  is required/requested.   Insurance verification completed.   The patient is insured through [facility] .   Per test claim: PA required; PA submitted to [facility] via Fax Key/confirmation #/EOC [id number] Status is pending  FAX# [phone] on 8.7.2024

## 2022-11-17 ENCOUNTER — Other Ambulatory Visit: Payer: Medicare HMO

## 2022-11-17 DIAGNOSIS — R140 Abdominal distension (gaseous): Secondary | ICD-10-CM | POA: Diagnosis not present

## 2022-11-17 DIAGNOSIS — K5909 Other constipation: Secondary | ICD-10-CM | POA: Diagnosis not present

## 2022-11-21 LAB — OVA AND PARASITE EXAMINATION
CONCENTRATE RESULT:: NONE SEEN
MICRO NUMBER:: 15335968
SPECIMEN QUALITY:: ADEQUATE
TRICHROME RESULT:: NONE SEEN

## 2022-11-22 ENCOUNTER — Telehealth: Payer: Self-pay | Admitting: Internal Medicine

## 2022-11-22 ENCOUNTER — Other Ambulatory Visit: Payer: Self-pay | Admitting: Family Medicine

## 2022-11-22 NOTE — Telephone Encounter (Signed)
Left message for pt to call back  °

## 2022-11-22 NOTE — Telephone Encounter (Signed)
Inbound call from patient providing update on linzess. Patient states she has been vomiting and also has severe diarrhea. Please advise

## 2022-11-28 NOTE — Telephone Encounter (Signed)
Pt did not return call, will await further communication from pt. 

## 2022-11-30 ENCOUNTER — Ambulatory Visit: Payer: Medicare HMO | Attending: Cardiology | Admitting: Cardiology

## 2022-11-30 ENCOUNTER — Encounter: Payer: Self-pay | Admitting: Cardiology

## 2022-11-30 VITALS — BP 120/74 | HR 63 | Ht 67.5 in | Wt 168.8 lb

## 2022-11-30 DIAGNOSIS — I10 Essential (primary) hypertension: Secondary | ICD-10-CM

## 2022-11-30 DIAGNOSIS — I25111 Atherosclerotic heart disease of native coronary artery with angina pectoris with documented spasm: Secondary | ICD-10-CM

## 2022-11-30 DIAGNOSIS — I2583 Coronary atherosclerosis due to lipid rich plaque: Secondary | ICD-10-CM

## 2022-11-30 DIAGNOSIS — I201 Angina pectoris with documented spasm: Secondary | ICD-10-CM

## 2022-11-30 DIAGNOSIS — I255 Ischemic cardiomyopathy: Secondary | ICD-10-CM

## 2022-11-30 DIAGNOSIS — Z79899 Other long term (current) drug therapy: Secondary | ICD-10-CM | POA: Diagnosis not present

## 2022-11-30 DIAGNOSIS — E7800 Pure hypercholesterolemia, unspecified: Secondary | ICD-10-CM

## 2022-11-30 DIAGNOSIS — I2510 Atherosclerotic heart disease of native coronary artery without angina pectoris: Secondary | ICD-10-CM

## 2022-11-30 NOTE — Patient Instructions (Signed)
Medication Instructions:  RESTART atorvastatin 80 mg  daily.  *If you need a refill on your cardiac medications before your next appointment, please call your pharmacy*   Lab Work: Please make an appointment to have a FASTING lipid panel and ALT drawn in our lab in 6 weeks.  If you have labs (blood work) drawn today and your tests are completely normal, you will receive your results only by: MyChart Message (if you have MyChart) OR A paper copy in the mail If you have any lab test that is abnormal or we need to change your treatment, we will call you to review the results.   Testing/Procedures: None.   Follow-Up: At [facility], you and your health needs are our priority.  As part of our continuing mission to provide you with exceptional heart care, we have created designated Provider Care Teams.  These Care Teams include your primary Cardiologist (physician) and Advanced Practice Providers (APPs -  Physician Assistants and Nurse Practitioners) who all work together to provide you with the care you need, when you need it.  We recommend signing up for the patient portal called "MyChart".  Sign up information is provided on this After Visit Summary.  MyChart is used to connect with patients for Virtual Visits (Telemedicine).  Patients are able to view lab/test results, encounter notes, upcoming appointments, etc.  Non-urgent messages can be sent to your provider as well.   To learn more about what you can do with MyChart, go to [email or web address].    Your next appointment:   1 year(s)  Provider:   Armanda Magic, MD

## 2022-11-30 NOTE — Progress Notes (Signed)
Date:  11/30/2022   ID:  Erin Lopez, DOB 1950/07/18, MRN [id number]   PCP:  Jeoffrey Massed, MD  Cardiologist:  Armanda Magic, MD  Electrophysiologist:  None   Chief Complaint:  HTN, lipids, CP, coronary vasospasm  History of Present Illness:    Erin Lopez is a 72 y.o. 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 She is intolerant to nitrates due to headache so she was changed to Amlodipine.  She also has HTN, hyperlipidemia, COPD and GERD.    Lexiscan myoview 2021 showed no ischemia.  2D echo showed normal heart function, and mild pulmonary HTN with PASP .  There was no significant valvular heart disease.   She is here today for followup and is doing well.  She has chronic SOB from her COPD which is controlled on her inhalers. She denies any chest pain or pressure, PND, orthopnea, LE edema, dizziness or syncope. Occasionally she will have palpitations but they are sporadic.  She is compliant with her meds and is tolerating meds with no SE.    Prior CV studies:   The following studies were reviewed today:  EKG Interpretation Date/Time:  Wednesday November 30 2022 11:33:29 EDT Ventricular Rate:  63 PR Interval:  188 QRS Duration:  78 QT Interval:  426 QTC Calculation: 435 R Axis:   63  Text Interpretation: Normal sinus rhythm Normal ECG When compared with ECG of 22-Nov-2021 14:29, Vent. rate has decreased BY  54 BPM Confirmed by Armanda Magic ([id number]) on 11/30/2022 11:39:41 AM     Steffanie Dunn and 2D echo  Past Medical History:  Diagnosis Date   Alcoholism in remission [facility])    states last alcohol 11 yrs ago   Anemia    Anxiety    Bipolar 1 disorder (HCC)    Admission for mania x 2 (most recent 11/2017)   Cancer (HCC)    Cervical spondylosis without myelopathy 12/19/2013   MRI 02/2014: multilevel DDD/spondylosis, not much change  compared to prior MRI.  Pt not in favor of invasive therapy for her neck as of 04/2014.   Cholelithiasis    COPD (chronic obstructive pulmonary disease) (HCC)    Moderate: anoro started 04/2017 by pulm, pt symptomatically improved and PFTs stable at f/u 06/2017.   Coronary artery spasm (HCC) 07/11/2018   nitrates=HA. Amlodipine=intol swelling.  Changed to coreg 09/03/18 by cardiologist.   Depression    Dilutional hyponatremia    Endometritis 05/2017   possible; empiric tx with Flagyl by GYN.   Environmental allergies    Fibromyalgia    GERD (gastroesophageal reflux disease)    Hepatic steatosis 03/2019   u/s abd   Herpes zoster 08/07/2017   L scapular region   Hiatal hernia    History of adenomatous polyp of colon 04/2008; 08/2014   No high grade dysplasia: recall 5 yrs (Dr. Bosie Clos, [facility])   History of basal cell carcinoma excision    NOSE   History of Helicobacter pylori infection 04/2008   +gastric biopsy (gastritis but no metaplasia, dysplasia, or malignancy identified)   History of kidney stones    History of TIA (transient ischemic attack)    secondary to HELLP syndrome 1994 post c/s--  residual memory loss   Hyperlipidemia    statin started after her NSTEMI: goal  LDL <70.   Hypertension    Internal hemorrhoids  Ischemic cardiomyopathy 05/2018   Echo EF 45-50% in context of NSTEMI from CA spasm. 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 Charlsie Merles 04/2017.   Leg pain    ABIs 11/2019: normal    Memory loss    Abnl MRI brain and CT brain c/w chronic microvascular ischemia.  Repeat MRI brain 02/2014 stable (Dr. Radonna Ricker with no plan to f/u with neuro as of 04/2014)   NSTEMI (non-ST elevated myocardial infarction) (HCC) 05/2018   Echo EF  45-50% in context of NSTEMI from CA spasm.// Myoview 8/21: EF 66, no ischemia, low risk   Osteoarthritis of left wrist    STT and 1st [facility] jt   Osteoporosis 05/2010   2012 'penia; 06/2015 'porosis:  prolia.  DEXA 09/2017 T-score -2.2 femur neck. 11/2021 T score -2.2. Rpt 2 yrs   Pelvic floor dysfunction    [facility] (Dr. Marlou Porch)   Pneumonia    Postmenopausal vaginal bleeding 05/2017   x 1 small episode; GYN attempted endo bx but unable to penetrate cervix due to severe cerv stenosis (due to postmenopausal state + hx of LEEP).  Endo u/s showed thin endo lining.  Per GYN---no evidence of endomet pathology on w/u---obs as of 07/2017.   Pre-diabetes    Prediabetes 06/2016   Fasting gluc 105; HbA1c at that time was 6.0%.  A1c 6.2% 12/2017.  A1c 5.7% Feb 2023.   Recurrent kidney stones 08/2013   Left ureteral calculus: Perc nephr--cystoscopy w/ureteroscopy + laser for stone removal.  Residual asymptomatic left renal nephrolithiasis <31mm present post-procedure.  Right sided hydronephrosis-persistent (on u/s)--urol ordered CT to further eval 08/2016: 1.6 cm nonobstructing stone lower pole left kidney, no hydro, plan for PCN extraction ([facility])   Shortness of breath    Sprain of neck 12/19/2013   Stroke (HCC)    TIA   TMJ (temporomandibular joint disorder)    USES  MOUTH GUARD   Toe fracture, left 12/2017   2nd toe prox phalanx (immobilization, Dr. Luciana Axe)   Tubular adenoma of colon    Vitamin D deficiency 05/2011   Weak urinary stream 07/2016   with elevated PVR and mild left hydronephrosis secondary to this ([facility] started flomax 0.4mg  qd for this).   Past Surgical History:  Procedure Laterality Date   ABIs  11/29/2019   NORMAL   ANTERIOR CERVICAL DECOMP/DISCECTOMY FUSION  06/30/2009   C3 -- C5  AND EXPLORATION OF FUSION C5-7 W/  PLATE REMOVAL   ARTHRODESIS METATARSALPHALANGEAL JOINT (MTPJ) Left 07/08/2021   Procedure: Left hallux metatarsophalangeal joint arthrodesis;  Surgeon:  Toni Arthurs, MD;  Location: [facility];  Service: Orthopedics;  Laterality: Left;   BACK SURGERY     BUNIONECTOMY Left    CARDIOVASCULAR STRESS TEST  12/03/2019   myoc perf im: NORMAL   CERVICAL FUSION  2002   anterior C5 -- C7   CERVICAL SPINE SURGERY  08/27/1999     C6 -- T1  LAMINECTOMY/  DISKECTOMY   CESAREAN SECTION  1994   CHOLECYSTECTOMY N/A 03/06/2020   Procedure: LAPAROSCOPIC CHOLECYSTECTOMY;  Surgeon: Quentin Ore, MD;  Location: [facility];  Service: General;  Laterality: N/A;   COLONOSCOPY W/ POLYPECTOMY  04/2008;08/2014   2016 tubular adenoma x 1; +diverticulosis and int/ext hemorrhoids.  06/2020 adenoma, recall 5 yrs.  CYSTOSCOPY WITH URETEROSCOPY AND STENT PLACEMENT Left 08/28/2013   Procedure: CYSTOSCOPY, RGP,  WITH URETEROSCOPY AND STENT REMOVAL;  Surgeon: Valetta Fuller, MD;  Location: [facility];  Service: Urology;  Laterality: Left;   DEXA  09/2017   T-score -2.2 femur neck: improved compared to 06/2015.  2021 DEXA T score -2.3.  11/2021 DEXA T score -2.2.   ESOPHAGOGASTRODUODENOSCOPY  2010; 08/2014   2016 +Candidal esophagitis; Mild chronic gastritis w/intestinal metaplasia--NEG H pylori, neg for eosinophilic esoph. 06/2020 esoph candidiasis (diflucan x 20d), o/w normal.   HAMMER TOE SURGERY Left 07/08/2021   Procedure: Second metatarsal head excision; revision Second hammertoe correction; 2-5 percutaneus flexor tenotomies;  Surgeon: Toni Arthurs, MD;  Location: [facility];  Service: Orthopedics;  Laterality: Left;   HARDWARE REMOVAL Left 07/08/2021   Procedure: Removal of deep implants from hallux proximal phalanx and First and Second metatarsals;  Surgeon: Toni Arthurs, MD;  Location:  SURGERY CENTER;  Service: Orthopedics;  Laterality: Left;   HOLMIUM LASER APPLICATION Left 08/28/2013   Procedure: HOLMIUM LASER APPLICATION;  Surgeon: Valetta Fuller, MD;  Location: [facility];  Service: Urology;   Laterality: Left;   JOINT REPLACEMENT     LEFT HEART CATH AND CORONARY ANGIOGRAPHY N/A 05/15/2018   Evidence for spasm in distal LAD.  Mod RCA atherosclerosis, o/w no CAD.  Procedure: LEFT HEART CATH AND CORONARY ANGIOGRAPHY;  Surgeon: Marykay Lex, MD;  Location: [facility] INVASIVE [facility];  Service: Cardiovascular;  Laterality: N/A;   NEGATIVE SLEEP STUDY  04/01/2007   NEPHROLITHOTOMY Left 08/05/2013   Procedure: NEPHROLITHOTOMY PERCUTANEOUS;  Surgeon: Valetta Fuller, MD;  Location: [facility];  Service: Urology;  Laterality: Left;   POLYSOMNOGRAM  01/2019   NORMAL   POLYSOMNOGRAPHY  10/25/2018   Dr. Luetta Nutting due to pt inadequat sleep time (83 min).   TONSILLECTOMY AND ADENOIDECTOMY  1975   TOTAL KNEE ARTHROPLASTY Right 2003   TOTAL KNEE REVISION Right 01/19/2022   Procedure: Right knee polyethylene revision;  Surgeon: Ollen Gross, MD;  Location: [facility];  Service: Orthopedics;  Laterality: Right;   TRANSTHORACIC ECHOCARDIOGRAM  05/2018; 12/03/2019   (after MI secondary to arterial spasm): EF 45-50%; severe hypokinesis of apical anterolateral, inferolateral , anterior, and inferior LV myocardium. 11/2019 EF 55-60%, valves fine   ULTRASOUND EXAM,PELVIC COMPLETE ([facility] HX)  06/25/2015   NORMAL (done by GYN)     Current Meds  Medication Sig   acetaminophen (TYLENOL) 500 MG tablet Take 500-1,000 mg by mouth daily as needed for mild pain or fever.   albuterol (VENTOLIN HFA) 108 (90 Base) MCG/ACT inhaler INHALE 2 PUFFS INTO THE LUNGS EVERY 4 HOURS AS NEEDED FOR WHEEZE OR FOR SHORTNESS OF BREATH (Patient taking differently: Inhale 2 puffs into the lungs every 6 (six) hours as needed for shortness of breath or wheezing.)   ALPRAZolam (XANAX) 0.5 MG tablet TAKE 1 TABLET BY MOUTH THREE TIMES A DAY AS NEEDED FOR ANXIETY   Ascorbic Acid (VITAMIN C PO) Take 1 tablet by mouth daily.   atorvastatin (LIPITOR) 80 MG tablet TAKE 1 TABLET BY MOUTH DAILY.   b complex vitamins capsule Take 1  capsule by mouth 2 (two) times daily.   buPROPion (WELLBUTRIN XL) 150 MG 24 hr tablet Take 1 tablet (150 mg total) by mouth daily.   Cholecalciferol (VITAMIN D-3 PO) Take 1 capsule by mouth daily.   citalopram (CELEXA) 20 MG tablet Take 1 tablet (20 mg total) by mouth in the  morning and at bedtime.   famotidine (PEPCID) 40 MG tablet Take 1 tablet (40 mg total) by mouth daily. (Patient taking differently: Take 40 mg by mouth daily as needed.)   fluticasone (FLONASE) 50 MCG/ACT nasal spray SPRAY 2 SPRAYS INTO EACH NOSTRIL EVERY DAY   hydrochlorothiazide (HYDRODIURIL) 12.5 MG tablet TAKE 1 TABLET BY MOUTH EVERY DAY   lamoTRIgine (LAMICTAL) 100 MG tablet TAKE 3 TABLETS (300 MG TOTAL) BY MOUTH AT BEDTIME   linaclotide (LINZESS) 72 MCG capsule Take 1 capsule (72 mcg total) by mouth daily before breakfast.   losartan (COZAAR) 25 MG tablet TAKE 1 TABLET BY MOUTH EVERY DAY   MAGNESIUM OXIDE PO Take 1 tablet by mouth daily.   meloxicam (MOBIC) 15 MG tablet TAKE 1 TABLET BY MOUTH ONCE DAILY AS NEEDED FOR JOINT PAIN   methocarbamol (ROBAXIN) 500 MG tablet Take 1 tablet (500 mg total) by mouth every 6 (six) hours as needed for muscle spasms.   metoprolol succinate (TOPROL-XL) 100 MG 24 hr tablet TAKE 1 TABLET BY MOUTH EVERY DAY WITH OR IMMEDIATELY FOLLOWING A MEAL   montelukast (SINGULAIR) 10 MG tablet TAKE 1 TABLET BY MOUTH EVERY DAY IN THE EVENING   Multiple Vitamins-Minerals (ZINC PO) Take 1 tablet by mouth daily.   nitroGLYCERIN (NITROSTAT) 0.4 MG SL tablet Place 1 tablet (0.4 mg total) under the tongue every 5 (five) minutes x 3 doses as needed for chest pain.   oxyCODONE (OXY IR/ROXICODONE) 5 MG immediate release tablet 1 tab po tid prn severe pain   pantoprazole (PROTONIX) 40 MG tablet TAKE 1 TABLET (40 MG TOTAL) BY MOUTH DAILY. KEEP F/U APPT FOR ANY FURTHER REFILLS   potassium chloride SA (KLOR-CON M20) 20 MEQ tablet Take 1 tablet (20 mEq total) by mouth daily.   Tiotropium Bromide-Olodaterol (STIOLTO  RESPIMAT) 2.5-2.5 MCG/ACT AERS INHALE 2 EACH INTO THE LUNGS DAILY.   zolpidem (AMBIEN) 10 MG tablet TAKE 1 TABLET BY MOUTH EVERYDAY AT BEDTIME     Allergies:   Ceftin [cefuroxime axetil], Cipro [ciprofloxacin hcl], Risperdal [risperidone], Fosamax [alendronate sodium], Morphine and codeine, Prednisone, Roxicodone [oxycodone], and Seroquel [quetiapine]   Social History   Tobacco Use   Smoking status: Former    Current packs/day: 0.00    Average packs/day: 1 pack/day for 33.0 years (33.0 ttl pk-yrs)    Types: Cigarettes    Start date: 08/22/1960    Quit date: 08/22/1993    Years since quitting: 29.2   Smokeless tobacco: Never  Vaping Use   Vaping status: Never Used  Substance Use Topics   Alcohol use: No    Alcohol/week: 0.0 standard drinks of alcohol    Comment: ALCOHOLIC IN REMISSION SINCE  2004   Drug use: No     Family Hx: The patient's family history includes Alcohol abuse in her mother; Arthritis in her mother; Breast cancer in her mother; Colon polyps in her brother and sister; Diabetes in her father and mother; Esophageal cancer in her sister; Heart disease in her brother and father; Hyperlipidemia in her father and mother; Hypertension in her father and mother; Irritable bowel syndrome in her sister; Parkinsonism in her mother; Prostate cancer in her father; Stomach cancer in her sister. There is no history of Colon cancer.  ROS:   Please see the history of present illness.     All other systems reviewed and are negative.   Labs/Other Tests and Data Reviewed:    Recent Labs: 03/30/2022: Hemoglobin 11.2; Platelets 276.0 10/12/2022: BUN 17; Creat 1.06; Potassium  4.0; Sodium 135; TSH 0.91   Recent Lipid Panel Lab Results  Component Value Date/Time   CHOL 151 05/06/2021 12:05 PM   CHOL 152 09/25/2018 10:07 AM   TRIG 98.0 05/06/2021 12:05 PM   HDL 58.90 05/06/2021 12:05 PM   HDL 62 09/25/2018 10:07 AM   CHOLHDL 3 05/06/2021 12:05 PM   LDLCALC 73 05/06/2021 12:05 PM    LDLCALC 69 09/25/2018 10:07 AM   LDLDIRECT 144.1 05/29/2013 11:56 AM    Wt Readings from Last 3 Encounters:  11/30/22 168 lb 12.8 oz (76.6 kg)  10/27/22 171 lb (77.6 kg)  10/12/22 172 lb (78 kg)     Objective:    Vital Signs:  BP 120/74 (BP Location: Left Arm, Patient Position: Sitting, Cuff Size: Normal)   Pulse 63   Ht 5' 7.5" (1.715 m)   Wt 168 lb 12.8 oz (76.6 kg)   SpO2 95%   BMI 26.05 kg/m    GEN: Well nourished, well developed in no acute distress HEENT: Normal NECK: No JVD; No carotid bruits LYMPHATICS: No lymphadenopathy CARDIAC:RRR, no murmurs, rubs, gallops RESPIRATORY:  Clear to auscultation without rales, wheezing or rhonchi  ABDOMEN: Soft, non-tender, non-distended MUSCULOSKELETAL:  No edema; No deformity  SKIN: Warm and dry NEUROLOGIC:  Alert and oriented x 3 PSYCHIATRIC:  Normal affect   ASSESSMENT & PLAN:    1.  ASCAD  -s/p NSTEMI with cath 05/2018 showing coronary artery vasospasm with distal LAD lesion at 60%. There was proximal to mid RCA lesion as well that was 45% stenosed.      -She was intolerant to long-acting nitrates due to severe headaches and was switched to amlodipine but she did not tolerate due to LE edema.   -Atypical chest pain in 2021 with Lexiscan myoview showing no ischemia and 2D echo with normal LVF -She has chronic shortness of breath related to COPD, emphysema and obesity controlled with inhalers -She is stable from a cardiac standpoint with no recurrent chest pain  -Continue prescription drug management with ASA 81 mg daily, atorvastatin 80 mg daily, Toprol-XL 100 mg daily with as needed refills  2.  Hypertension  -BP controlled on exam today -Continue drug management with losartan 25 mg daily Toprol-XL 100 mg daily with as needed refills -I have personally reviewed and interpreted outside labs performed by patient's PCP which showed serum creatinine 1.06 and potassium 4 on 10/12/2022  3.  Hyperlipidemia  -her LDL goal is less  than 70.   -Repeat FLP and ALT in 6 weeks since she stopped her statin due to mental problems but she did not get better after stopping so I encouraged her to start back -Continue prescription drug management with atorvastatin 80 mg daily with as needed refills  4.  Ischemic dilated cardiomyopathy  -secondary to NSTEMI with distal LAD spasm.   -Her EF was 45 to 50% by echo at time of MI -LVF has normalized on echo 2021 -Continue prescription drug management with losartan 25 mg daily Toprol XL 100 mg daily with as needed refills   Medication Adjustments/Labs and Tests Ordered: Current medicines are reviewed at length with the patient today.  Concerns regarding medicines are outlined above.  Tests Ordered: Orders Placed This Encounter  Procedures   EKG 12-Lead   Medication Changes: No orders of the defined types were placed in this encounter.   Disposition:  Follow up 1 year  Signed, Armanda Magic, MD  11/30/2022 11:40 AM     [facility]

## 2022-11-30 NOTE — Addendum Note (Signed)
Addended by: Luellen Pucker on: 11/30/2022 11:51 AM   Modules accepted: Orders

## 2022-12-06 ENCOUNTER — Other Ambulatory Visit: Payer: Self-pay | Admitting: Cardiology

## 2022-12-12 ENCOUNTER — Other Ambulatory Visit: Payer: Self-pay | Admitting: Family Medicine

## 2022-12-14 ENCOUNTER — Telehealth: Payer: Self-pay

## 2022-12-14 NOTE — Telephone Encounter (Signed)
noted 

## 2022-12-14 NOTE — Telephone Encounter (Signed)
PCP called stating that she has been having high blood pressure with occasional SOB. Pt was advised to go to ED/UC. Pt declines going to either but has agreed to have OV with PCP. Pt is scheduled on 09/12 @ 4 PM

## 2022-12-15 ENCOUNTER — Ambulatory Visit (INDEPENDENT_AMBULATORY_CARE_PROVIDER_SITE_OTHER): Payer: Medicare HMO | Admitting: Family Medicine

## 2022-12-15 ENCOUNTER — Encounter: Payer: Self-pay | Admitting: Family Medicine

## 2022-12-15 VITALS — BP 118/69 | HR 75 | Temp 98.6°F | Ht 67.5 in | Wt 167.6 lb

## 2022-12-15 DIAGNOSIS — R0989 Other specified symptoms and signs involving the circulatory and respiratory systems: Secondary | ICD-10-CM

## 2022-12-15 DIAGNOSIS — F3162 Bipolar disorder, current episode mixed, moderate: Secondary | ICD-10-CM | POA: Diagnosis not present

## 2022-12-15 MED ORDER — BUPROPION HCL ER (XL) 300 MG PO TB24: 300.0000 mg | ORAL_TABLET | Freq: Every day | ORAL | 1 refills | Status: DC

## 2022-12-15 MED ORDER — CITALOPRAM HYDROBROMIDE 20 MG PO TABS: 20.0000 mg | ORAL_TABLET | Freq: Two times a day (BID) | ORAL | Status: DC

## 2022-12-15 MED ORDER — CITALOPRAM HYDROBROMIDE 40 MG PO TABS: 40.0000 mg | ORAL_TABLET | Freq: Every day | ORAL | 3 refills | Status: DC

## 2022-12-15 NOTE — Progress Notes (Signed)
OFFICE VISIT  12/15/2022  CC:  Chief Complaint  Patient presents with   Hypertension    Recheck blood pressure, pt said last week bp was in the 171/90,160/182 took prior prescription flexeril    Patient is a 72 y.o. female who presents for elevated blood pressures.  HPI: About 10 days ago she started to feel depressed. Started when her daughter moved out. No motivation, wants to sleep all the time, has crying spells.  Racing thoughts and tendency to be very chatty--which is her usual when she gets it in a mixed episode. No SI or HI. She has been tensing up in her upper body quite a bit lately.  This drives her blood pressure up, which makes her more tense, which creates a vicious cycle. Blood pressures in the 170/90 range last week.  She had a couple higher ones in the last few days.  She took nitroglycerin tabs to try to bring it down some.  She did not have chest pain or shortness of breath at that time.  Review of systems: She is having episodic stomachaches.  She had a couple of loose stools earlier in the week.  She is not wanting to eat much.  Past Medical History:  Diagnosis Date   Alcoholism in remission [facility])    states last alcohol 11 yrs ago   Anemia    Anxiety    Bipolar 1 disorder (HCC)    Admission for mania x 2 (most recent 11/2017)   Cancer [facility])    Cervical spondylosis without myelopathy 12/19/2013   MRI 02/2014: multilevel DDD/spondylosis, not much change compared to prior MRI.  Pt not in favor of invasive therapy for her neck as of 04/2014.   Cholelithiasis    COPD (chronic obstructive pulmonary disease) (HCC)    Moderate: anoro started 04/2017 by pulm, pt symptomatically improved and PFTs stable at f/u 06/2017.   Coronary artery spasm (HCC) 07/11/2018   nitrates=HA. Amlodipine=intol swelling.  Changed to coreg 09/03/18 by cardiologist.   Depression    Dilutional hyponatremia    Endometritis 05/2017   possible; empiric tx with Flagyl by GYN.   Environmental  allergies    Fibromyalgia    GERD (gastroesophageal reflux disease)    Hepatic steatosis 03/2019   u/s abd   Herpes zoster 08/07/2017   L scapular region   Hiatal hernia    History of adenomatous polyp of colon 04/2008; 08/2014   No high grade dysplasia: recall 5 yrs (Dr. Bosie Clos, [facility])   History of basal cell carcinoma excision    NOSE   History of Helicobacter pylori infection 04/2008   +gastric biopsy (gastritis but no metaplasia, dysplasia, or malignancy identified)   History of kidney stones    History of TIA (transient ischemic attack)    secondary to HELLP syndrome 1994 post c/s--  residual memory loss   Hyperlipidemia    statin started after her NSTEMI: goal  LDL <70.   Hypertension    Internal hemorrhoids    Ischemic cardiomyopathy 05/2018   Echo EF 45-50% in context of NSTEMI from CA spasm. 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 Charlsie Merles 04/2017.   Leg pain    ABIs 11/2019: normal    Memory loss    Abnl MRI brain and CT brain  c/w chronic microvascular ischemia.  Repeat MRI brain 02/2014 stable (Dr. Radonna Ricker with no plan to f/u with neuro as of 04/2014)   NSTEMI (non-ST elevated myocardial infarction) (HCC) 05/2018   Echo EF 45-50% in context of NSTEMI from CA spasm.// Myoview 8/21: EF 66, no ischemia, low risk   Osteoarthritis of left wrist    STT and 1st [facility] jt   Osteoporosis 05/2010   2012 'penia; 06/2015 'porosis:  prolia.  DEXA 09/2017 T-score -2.2 femur neck. 11/2021 T score -2.2. Rpt 2 yrs   Pelvic floor dysfunction    [facility] (Dr. Marlou Porch)   Pneumonia    Postmenopausal vaginal bleeding 05/2017   x 1 small episode; GYN attempted endo bx but unable to penetrate cervix due to severe cerv stenosis (due to postmenopausal  state + hx of LEEP).  Endo u/s showed thin endo lining.  Per GYN---no evidence of endomet pathology on w/u---obs as of 07/2017.   Pre-diabetes    Prediabetes 06/2016   Fasting gluc 105; HbA1c at that time was 6.0%.  A1c 6.2% 12/2017.  A1c 5.7% Feb 2023.   Recurrent kidney stones 08/2013   Left ureteral calculus: Perc nephr--cystoscopy w/ureteroscopy + laser for stone removal.  Residual asymptomatic left renal nephrolithiasis <75mm present post-procedure.  Right sided hydronephrosis-persistent (on u/s)--urol ordered CT to further eval 08/2016: 1.6 cm nonobstructing stone lower pole left kidney, no hydro, plan for PCN extraction ([facility])   Shortness of breath    Sprain of neck 12/19/2013   Stroke (HCC)    TIA   TMJ (temporomandibular joint disorder)    USES  MOUTH GUARD   Toe fracture, left 12/2017   2nd toe prox phalanx (immobilization, Dr. Luciana Axe)   Tubular adenoma of colon    Vitamin D deficiency 05/2011   Weak urinary stream 07/2016   with elevated PVR and mild left hydronephrosis secondary to this ([facility] started flomax 0.4mg  qd for this).    Past Surgical History:  Procedure Laterality Date   ABIs  11/29/2019   NORMAL   ANTERIOR CERVICAL DECOMP/DISCECTOMY FUSION  06/30/2009   C3 -- C5  AND EXPLORATION OF FUSION C5-7 W/  PLATE REMOVAL   ARTHRODESIS METATARSALPHALANGEAL JOINT (MTPJ) Left 07/08/2021   Procedure: Left hallux metatarsophalangeal joint arthrodesis;  Surgeon: Toni Arthurs, MD;  Location: [facility];  Service: Orthopedics;  Laterality: Left;   BACK SURGERY     BUNIONECTOMY Left    CARDIOVASCULAR STRESS TEST  12/03/2019   myoc perf im: NORMAL   CERVICAL FUSION  2002   anterior C5 -- C7   CERVICAL SPINE SURGERY  08/27/1999     C6 -- T1  LAMINECTOMY/  DISKECTOMY   CESAREAN SECTION  1994   CHOLECYSTECTOMY N/A 03/06/2020   Procedure: LAPAROSCOPIC CHOLECYSTECTOMY;  Surgeon: Quentin Ore, MD;  Location: [facility];  Service: General;  Laterality: N/A;    COLONOSCOPY W/ POLYPECTOMY  04/2008;08/2014   2016 tubular adenoma x 1; +diverticulosis and int/ext hemorrhoids.  06/2020 adenoma, recall 5 yrs.   CYSTOSCOPY WITH URETEROSCOPY AND STENT PLACEMENT Left 08/28/2013   Procedure: CYSTOSCOPY, RGP,  WITH URETEROSCOPY AND STENT REMOVAL;  Surgeon: Valetta Fuller, MD;  Location: [facility];  Service: Urology;  Laterality: Left;   DEXA  09/2017   T-score -2.2 femur neck: improved compared to 06/2015.  2021 DEXA T score -2.3.  11/2021 DEXA T score -2.2.   ESOPHAGOGASTRODUODENOSCOPY  2010; 08/2014   2016 +Candidal esophagitis; Mild chronic gastritis w/intestinal metaplasia--NEG H pylori,  neg for eosinophilic esoph. 06/2020 esoph candidiasis (diflucan x 20d), o/w normal.   HAMMER TOE SURGERY Left 07/08/2021   Procedure: Second metatarsal head excision; revision Second hammertoe correction; 2-5 percutaneus flexor tenotomies;  Surgeon: Toni Arthurs, MD;  Location: [facility];  Service: Orthopedics;  Laterality: Left;   HARDWARE REMOVAL Left 07/08/2021   Procedure: Removal of deep implants from hallux proximal phalanx and First and Second metatarsals;  Surgeon: Toni Arthurs, MD;  Location: [facility];  Service: Orthopedics;  Laterality: Left;   HOLMIUM LASER APPLICATION Left 08/28/2013   Procedure: HOLMIUM LASER APPLICATION;  Surgeon: Valetta Fuller, MD;  Location: [facility];  Service: Urology;  Laterality: Left;   JOINT REPLACEMENT     LEFT HEART CATH AND CORONARY ANGIOGRAPHY N/A 05/15/2018   Evidence for spasm in distal LAD.  Mod RCA atherosclerosis, o/w no CAD.  Procedure: LEFT HEART CATH AND CORONARY ANGIOGRAPHY;  Surgeon: Marykay Lex, MD;  Location: [facility] IN[facility]AS[facility];  Service: Cardiovascular;  Laterality: N/A;   NEGATIVE SLEEP STUDY  04/01/2007   NEPHROLITHOTOMY Left 08/05/2013   Procedure: NEPHROLITHOTOMY PERCUTANEOUS;  Surgeon: Valetta Fuller, MD;  Location: [facility];  Service: Urology;   Laterality: Left;   POLYSOMNOGRAM  01/2019   NORMAL   POLYSOMNOGRAPHY  10/25/2018   Dr. Luetta Nutting due to pt inadequat sleep time (83 min).   TONSILLECTOMY AND ADENOIDECTOMY  1975   TOTAL KNEE ARTHROPLASTY Right 2003   TOTAL KNEE REVISION Right 01/19/2022   Procedure: Right knee polyethylene revision;  Surgeon: Ollen Gross, MD;  Location: [facility];  Service: Orthopedics;  Laterality: Right;   TRANSTHORACIC ECHOCARDIOGRAM  05/2018; 12/03/2019   (after MI secondary to arterial spasm): EF 45-50%; severe hypokinesis of apical anterolateral, inferolateral , anterior, and inferior LV myocardium. 11/2019 EF 55-60%, valves fine   ULTRASOUND EXAM,PELVIC COMPLETE ([facility] HX)  06/25/2015   NORMAL (done by GYN)    Outpatient Medications Prior to Visit  Medication Sig Dispense Refill   acetaminophen (TYLENOL) 500 MG tablet Take 500-1,000 mg by mouth daily as needed for mild pain or fever.     albuterol (VENTOLIN HFA) 108 (90 Base) MCG/ACT inhaler INHALE 2 PUFFS INTO THE LUNGS EVERY 4 HOURS AS NEEDED FOR WHEEZE OR FOR SHORTNESS OF BREATH (Patient taking differently: Inhale 2 puffs into the lungs every 6 (six) hours as needed for shortness of breath or wheezing.) 8.5 each 2   ALPRAZolam (XANAX) 0.5 MG tablet TAKE 1 TABLET BY MOUTH THREE TIMES A DAY AS NEEDED FOR ANXIETY 90 tablet 5   Ascorbic Acid (VITAMIN C PO) Take 1 tablet by mouth daily.     aspirin EC 81 MG tablet Take 81 mg by mouth daily. Swallow whole.     atorvastatin (LIPITOR) 80 MG tablet TAKE 1 TABLET BY MOUTH DAILY. 90 tablet 3   b complex vitamins capsule Take 1 capsule by mouth 2 (two) times daily.     Cholecalciferol (VITAMIN D-3 PO) Take 1 capsule by mouth daily.     fluticasone (FLONASE) 50 MCG/ACT nasal spray SPRAY 2 SPRAYS INTO EACH NOSTRIL EVERY DAY 48 mL 1   hydrochlorothiazide (HYDRODIURIL) 12.5 MG tablet TAKE 1 TABLET BY MOUTH EVERY DAY 90 tablet 3   lamoTRIgine (LAMICTAL) 100 MG tablet TAKE 3 TABLETS (300 MG TOTAL)  BY MOUTH AT BEDTIME 270 tablet 2   linaclotide (LINZESS) 72 MCG capsule Take 1 capsule (72 mcg total) by mouth daily before breakfast. 30 capsule 5   losartan (  COZAAR) 25 MG tablet TAKE 1 TABLET BY MOUTH EVERY DAY 90 tablet 0   MAGNESIUM OXIDE PO Take 1 tablet by mouth daily.     meloxicam (MOBIC) 15 MG tablet TAKE 1 TABLET BY MOUTH ONCE DAILY AS NEEDED FOR JOINT PAIN 30 tablet 0   methocarbamol (ROBAXIN) 500 MG tablet Take 1 tablet (500 mg total) by mouth every 6 (six) hours as needed for muscle spasms. 40 tablet 0   metoprolol succinate (TOPROL-XL) 100 MG 24 hr tablet TAKE 1 TABLET BY MOUTH EVERY DAY WITH OR IMMEDIATELY FOLLOWING A MEAL 90 tablet 1   montelukast (SINGULAIR) 10 MG tablet TAKE 1 TABLET BY MOUTH EVERY DAY IN THE EVENING 90 tablet 1   Multiple Vitamins-Minerals (ZINC PO) Take 1 tablet by mouth daily.     nitroGLYCERIN (NITROSTAT) 0.4 MG SL tablet Place 1 tablet (0.4 mg total) under the tongue every 5 (five) minutes x 3 doses as needed for chest pain. 25 tablet 3   oxyCODONE (OXY IR/ROXICODONE) 5 MG immediate release tablet 1 tab po tid prn severe pain 30 tablet 0   pantoprazole (PROTONIX) 40 MG tablet TAKE 1 TABLET (40 MG TOTAL) BY MOUTH DAILY. KEEP F/U APPT FOR ANY FURTHER REFILLS 30 tablet 5   potassium chloride SA (KLOR-CON M20) 20 MEQ tablet Take 1 tablet (20 mEq total) by mouth daily. 90 tablet 1   Tiotropium Bromide-Olodaterol (STIOLTO RESPIMAT) 2.5-2.5 MCG/ACT AERS INHALE 2 EACH INTO THE LUNGS DAILY. 4 g 11   zolpidem (AMBIEN) 10 MG tablet TAKE 1 TABLET BY MOUTH EVERYDAY AT BEDTIME 90 tablet 1   buPROPion (WELLBUTRIN XL) 150 MG 24 hr tablet Take 1 tablet (150 mg total) by mouth daily. 90 tablet 1   citalopram (CELEXA) 20 MG tablet Take 1 tablet (20 mg total) by mouth in the morning and at bedtime. 180 tablet 1   famotidine (PEPCID) 40 MG tablet Take 1 tablet (40 mg total) by mouth daily. (Patient not taking: Reported on 12/15/2022) 90 tablet 3   No facility-administered  medications prior to visit.    Allergies  Allergen Reactions   Ceftin [Cefuroxime Axetil] Anaphylaxis and Swelling    Swelling of eyes and tongue   Cipro [Ciprofloxacin Hcl] Anaphylaxis and Swelling   Risperdal [Risperidone] Palpitations    Generalized weakness Malaise  Hypertension   Fosamax [Alendronate Sodium] Diarrhea   Morphine And Codeine Other (See Comments)    Hallucinations Disorientation    Prednisone Other (See Comments)    Hyperactivity   Roxicodone [Oxycodone] Other (See Comments)    Previously addicted to medication   Seroquel [Quetiapine] Other (See Comments)    Oversedation     Review of Systems  As per HPI  PE:    12/15/2022    4:18 PM 11/30/2022   11:27 AM 10/27/2022    3:23 PM  Vitals with BMI  Height 5' 7.5" 5' 7.5" 5' 7.5"  Weight 167 lbs 10 oz 168 lbs 13 oz 171 lbs  BMI 25.85 26.03 26.37  Systolic 118 120 621  Diastolic 69 74 68  Pulse 75 63 64     Physical Exam  Gen: Alert, well appearing.  Patient is oriented to person, place, time, and situation. AFFECT: pleasant, lucid thought and speech.  Chatty, tendency to get a little off subject/ramble CV: RRR, no m/r/g.   LUNGS: CTA bilat, nonlabored resps, good aeration in all lung fields. EXT: no clubbing or cyanosis.  no edema.    LABS:  Last CBC Lab Results  Component Value Date   WBC 6.9 03/30/2022   HGB 11.2 (L) 03/30/2022   HCT 32.3 (L) 03/30/2022   MCV 89.3 03/30/2022   MCH 31.5 01/20/2022   RDW 13.1 03/30/2022   PLT 276.0 03/30/2022   Lab Results  Component Value Date   IRON 84 04/29/2014   FERRITIN 27.6 04/29/2014   Last metabolic panel Lab Results  Component Value Date   GLUCOSE 120 (H) 10/12/2022   NA 135 10/12/2022   K 4.0 10/12/2022   CL 100 10/12/2022   CO2 23 10/12/2022   BUN 17 10/12/2022   CREATININE 1.06 (H) 10/12/2022   GFR 54.31 (L) 03/30/2022   CALCIUM 9.6 10/12/2022   PROT 6.6 05/06/2021   ALBUMIN 4.6 05/06/2021   BILITOT 0.8 05/06/2021   ALKPHOS 55  05/06/2021   AST 23 05/06/2021   ALT 22 05/06/2021   ANIONGAP 5 01/20/2022   Last hemoglobin A1c Lab Results  Component Value Date   HGBA1C 5.5 01/06/2022   Last thyroid functions Lab Results  Component Value Date   TSH 0.91 10/12/2022   IMPRESSION AND PLAN:  #1 bipolar disorder, mixed episode. Increase Wellbutrin XL to 300 mg a day. She is maxed out on citalopram (20mg  bid) and lamictal (300 every day). She takes alprazolam 0.5 mg 3 times a day. Could consider adding Depakote in the future versus adding lithium or atypical antipsychotic.  #2 hypertension, labile.  Her anxiety/stress/mood disorder induces brief elevations in blood pressure. Blood pressure is normal here in the office today. Continue HCTZ 12.5 mg a day, losartan 25 mg a day, and Toprol-XL 100 mg a day.  An After Visit Summary was printed and given to the patient.  FOLLOW UP: Return in about 2 weeks (around 12/29/2022) for f/u bipolar depression.  Signed:  Santiago Bumpers, MD           12/15/2022

## 2022-12-15 NOTE — Patient Instructions (Addendum)
I have sent in rx for wellbutrin xl 300mg  daily.

## 2022-12-22 ENCOUNTER — Other Ambulatory Visit: Payer: Self-pay | Admitting: Cardiology

## 2022-12-22 ENCOUNTER — Other Ambulatory Visit: Payer: Self-pay | Admitting: Family Medicine

## 2022-12-28 ENCOUNTER — Ambulatory Visit: Payer: Medicare HMO | Admitting: Family Medicine

## 2023-01-04 ENCOUNTER — Ambulatory Visit: Payer: Medicare HMO | Admitting: Family Medicine

## 2023-01-04 ENCOUNTER — Encounter: Payer: Self-pay | Admitting: Family Medicine

## 2023-01-04 VITALS — BP 152/85 | HR 65 | Wt 175.2 lb

## 2023-01-04 DIAGNOSIS — K5909 Other constipation: Secondary | ICD-10-CM | POA: Diagnosis not present

## 2023-01-04 DIAGNOSIS — F3162 Bipolar disorder, current episode mixed, moderate: Secondary | ICD-10-CM | POA: Diagnosis not present

## 2023-01-04 DIAGNOSIS — J180 Bronchopneumonia, unspecified organism: Secondary | ICD-10-CM

## 2023-01-04 MED ORDER — METHYLPREDNISOLONE ACETATE 80 MG/ML IJ SUSP
80.0000 mg | Freq: Once | INTRAMUSCULAR | Status: AC
Start: 2023-01-04 — End: 2023-01-04
  Administered 2023-01-04: 80 mg via INTRAMUSCULAR

## 2023-01-04 MED ORDER — AMOXICILLIN 875 MG PO TABS: 875.0000 mg | ORAL_TABLET | Freq: Two times a day (BID) | ORAL | 0 refills | Status: AC

## 2023-01-04 NOTE — Progress Notes (Signed)
OFFICE VISIT  01/04/2023  CC:  Chief Complaint  Patient presents with   Depression    F/u. Pt states she feels like she is still "pushing through"; still having motivation issues.     Patient is a 72 y.o. female who presents for 3 wk f/u bipolar depression. A/P as of last visit: "#1 bipolar disorder, mixed episode. Increase Wellbutrin XL to 300 mg a day. She is maxed out on citalopram (20mg  bid) and lamictal (300 every day). She takes alprazolam 0.5 mg 3 times a day. Could consider adding Depakote in the future versus adding lithium or atypical antipsychotic.   #2 hypertension, labile.  Her anxiety/stress/mood disorder induces brief elevations in blood pressure. Blood pressure is normal here in the office today. Continue HCTZ 12.5 mg a day, losartan 25 mg a day, and Toprol-XL 100 mg a day."  INTERIM HX: Constipation is bothering her a lot.  She was prescribed Linzess by gastroenterology but this resulted in diarrhea so she stopped it.  Now she has had return of bad constipation and says she has not had a normal bowel movement in 10 days.  She feels bloated in her lower abdomen.  No significant abdominal pain other than mild chronic left lower quadrant pain that is unchanged.  She does pass some gas. Lately she has had dry cough and wheezing and malaise/fatigue.  No fever. No shortness of breath, no chest pain.  She says it has been difficult to tell whether or not she has any change from an emotional/mental standpoint because she is so distracted by her acute on chronic constipation problems and current respiratory illness.  ROS as above, plus--> no dizziness, no HAs, no rashes, no melena/hematochezia.  No polyuria or polydipsia.  No myalgias or arthralgias.   No dysuria or unusual/new urinary urgency or frequency.  No recent changes in lower legs. No n/v.  No palpitations.    Past Medical History:  Diagnosis Date   Alcoholism in remission [facility])    states last alcohol 11 yrs ago    Anemia    Anxiety    Bipolar 1 disorder (HCC)    Admission for mania x 2 (most recent 11/2017)   Cancer [facility])    Cervical spondylosis without myelopathy 12/19/2013   MRI 02/2014: multilevel DDD/spondylosis, not much change compared to prior MRI.  Pt not in favor of invasive therapy for her neck as of 04/2014.   Cholelithiasis    COPD (chronic obstructive pulmonary disease) (HCC)    Moderate: anoro started 04/2017 by pulm, pt symptomatically improved and PFTs stable at f/u 06/2017.   Coronary artery spasm (HCC) 07/11/2018   nitrates=HA. Amlodipine=intol swelling.  Changed to coreg 09/03/18 by cardiologist.   Depression    Dilutional hyponatremia    Endometritis 05/2017   possible; empiric tx with Flagyl by GYN.   Environmental allergies    Fibromyalgia    GERD (gastroesophageal reflux disease)    Hepatic steatosis 03/2019   u/s abd   Herpes zoster 08/07/2017   L scapular region   Hiatal hernia    History of adenomatous polyp of colon 04/2008; 08/2014   No high grade dysplasia: recall 5 yrs (Dr. Bosie Clos, [facility])   History of basal cell carcinoma excision    NOSE   History of Helicobacter pylori infection 04/2008   +gastric biopsy (gastritis but no metaplasia, dysplasia, or malignancy identified)   History of kidney stones    History of TIA (transient ischemic attack)    secondary to HELLP  syndrome 1994 post c/s--  residual memory loss   Hyperlipidemia    statin started after her NSTEMI: goal  LDL <70.   Hypertension    Internal hemorrhoids    Ischemic cardiomyopathy 05/2018   Echo EF 45-50% in context of NSTEMI from CA spasm. 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 Charlsie Merles 04/2017.   Leg pain    ABIs 11/2019: normal    Memory loss    Abnl MRI  brain and CT brain c/w chronic microvascular ischemia.  Repeat MRI brain 02/2014 stable (Dr. Radonna Ricker with no plan to f/u with neuro as of 04/2014)   NSTEMI (non-ST elevated myocardial infarction) (HCC) 05/2018   Echo EF 45-50% in context of NSTEMI from CA spasm.// Myoview 8/21: EF 66, no ischemia, low risk   Osteoarthritis of left wrist    STT and 1st [facility] jt   Osteoporosis 05/2010   2012 'penia; 06/2015 'porosis:  prolia.  DEXA 09/2017 T-score -2.2 femur neck. 11/2021 T score -2.2. Rpt 2 yrs   Pelvic floor dysfunction    [facility] (Dr. Marlou Porch)   Pneumonia    Postmenopausal vaginal bleeding 05/2017   x 1 small episode; GYN attempted endo bx but unable to penetrate cervix due to severe cerv stenosis (due to postmenopausal state + hx of LEEP).  Endo u/s showed thin endo lining.  Per GYN---no evidence of endomet pathology on w/u---obs as of 07/2017.   Pre-diabetes    Prediabetes 06/2016   Fasting gluc 105; HbA1c at that time was 6.0%.  A1c 6.2% 12/2017.  A1c 5.7% Feb 2023.   Recurrent kidney stones 08/2013   Left ureteral calculus: Perc nephr--cystoscopy w/ureteroscopy + laser for stone removal.  Residual asymptomatic left renal nephrolithiasis <70mm present post-procedure.  Right sided hydronephrosis-persistent (on u/s)--urol ordered CT to further eval 08/2016: 1.6 cm nonobstructing stone lower pole left kidney, no hydro, plan for PCN extraction ([facility])   Shortness of breath    Sprain of neck 12/19/2013   Stroke (HCC)    TIA   TMJ (temporomandibular joint disorder)    USES  MOUTH GUARD   Toe fracture, left 12/2017   2nd toe prox phalanx (immobilization, Dr. Luciana Axe)   Tubular adenoma of colon    Vitamin D deficiency 05/2011   Weak urinary stream 07/2016   with elevated PVR and mild left hydronephrosis secondary to this ([facility] started flomax 0.4mg  qd for this).    Past Surgical History:  Procedure Laterality Date   ABIs  11/29/2019   NORMAL   ANTERIOR CERVICAL  DECOMP/DISCECTOMY FUSION  06/30/2009   C3 -- C5  AND EXPLORATION OF FUSION C5-7 W/  PLATE REMOVAL   ARTHRODESIS METATARSALPHALANGEAL JOINT (MTPJ) Left 07/08/2021   Procedure: Left hallux metatarsophalangeal joint arthrodesis;  Surgeon: Toni Arthurs, MD;  Location: [facility];  Service: Orthopedics;  Laterality: Left;   BACK SURGERY     BUNIONECTOMY Left    CARDIOVASCULAR STRESS TEST  12/03/2019   myoc perf im: NORMAL   CERVICAL FUSION  2002   anterior C5 -- C7   CERVICAL SPINE SURGERY  08/27/1999     C6 -- T1  LAMINECTOMY/  DISKECTOMY   CESAREAN SECTION  1994   CHOLECYSTECTOMY N/A 03/06/2020   Procedure: LAPAROSCOPIC CHOLECYSTECTOMY;  Surgeon: Quentin Ore,  MD;  Location: [facility];  Service: General;  Laterality: N/A;   COLONOSCOPY W/ POLYPECTOMY  04/2008;08/2014   2016 tubular adenoma x 1; +diverticulosis and int/ext hemorrhoids.  06/2020 adenoma, recall 5 yrs.   CYSTOSCOPY WITH URETEROSCOPY AND STENT PLACEMENT Left 08/28/2013   Procedure: CYSTOSCOPY, RGP,  WITH URETEROSCOPY AND STENT REMOVAL;  Surgeon: Valetta Fuller, MD;  Location: [facility];  Service: Urology;  Laterality: Left;   DEXA  09/2017   T-score -2.2 femur neck: improved compared to 06/2015.  2021 DEXA T score -2.3.  11/2021 DEXA T score -2.2.   ESOPHAGOGASTRODUODENOSCOPY  2010; 08/2014   2016 +Candidal esophagitis; Mild chronic gastritis w/intestinal metaplasia--NEG H pylori, neg for eosinophilic esoph. 06/2020 esoph candidiasis (diflucan x 20d), o/w normal.   HAMMER TOE SURGERY Left 07/08/2021   Procedure: Second metatarsal head excision; revision Second hammertoe correction; 2-5 percutaneus flexor tenotomies;  Surgeon: Toni Arthurs, MD;  Location: [facility];  Service: Orthopedics;  Laterality: Left;   HARDWARE REMOVAL Left 07/08/2021   Procedure: Removal of deep implants from hallux proximal phalanx and First and Second metatarsals;  Surgeon: Toni Arthurs, MD;  Location: [facility];  Service: Orthopedics;  Laterality: Left;   HOLMIUM LASER APPLICATION Left 08/28/2013   Procedure: HOLMIUM LASER APPLICATION;  Surgeon: Valetta Fuller, MD;  Location: [facility];  Service: Urology;  Laterality: Left;   JOINT REPLACEMENT     LEFT HEART CATH AND CORONARY ANGIOGRAPHY N/A 05/15/2018   Evidence for spasm in distal LAD.  Mod RCA atherosclerosis, o/w no CAD.  Procedure: LEFT HEART CATH AND CORONARY ANGIOGRAPHY;  Surgeon: Marykay Lex, MD;  Location: [facility];  Service: Cardiovascular;  Laterality: N/A;   NEGATIVE SLEEP STUDY  04/01/2007   NEPHROLITHOTOMY Left 08/05/2013   Procedure: NEPHROLITHOTOMY PERCUTANEOUS;  Surgeon: Valetta Fuller, MD;  Location: [facility];  Service: Urology;  Laterality: Left;   POLYSOMNOGRAM  01/2019   NORMAL   POLYSOMNOGRAPHY  10/25/2018   Dr. Luetta Nutting due to pt inadequat sleep time (83 min).   TONSILLECTOMY AND ADENOIDECTOMY  1975   TOTAL KNEE ARTHROPLASTY Right 2003   TOTAL KNEE REVISION Right 01/19/2022   Procedure: Right knee polyethylene revision;  Surgeon: Ollen Gross, MD;  Location: [facility];  Service: Orthopedics;  Laterality: Right;   TRANSTHORACIC ECHOCARDIOGRAM  05/2018; 12/03/2019   (after MI secondary to arterial spasm): EF 45-50%; severe hypokinesis of apical anterolateral, inferolateral , anterior, and inferior LV myocardium. 11/2019 EF 55-60%, valves fine   ULTRASOUND EXAM,PELVIC COMPLETE ([facility] HX)  06/25/2015   NORMAL (done by GYN)    Outpatient Medications Prior to Visit  Medication Sig Dispense Refill   acetaminophen (TYLENOL) 500 MG tablet Take 500-1,000 mg by mouth daily as needed for mild pain or fever.     albuterol (VENTOLIN HFA) 108 (90 Base) MCG/ACT inhaler INHALE 2 PUFFS INTO THE LUNGS EVERY 4 HOURS AS NEEDED FOR WHEEZE OR FOR SHORTNESS OF BREATH (Patient taking differently: Inhale 2 puffs into the lungs every 6 (six) hours as needed for shortness of breath or  wheezing.) 8.5 each 2   ALPRAZolam (XANAX) 0.5 MG tablet TAKE 1 TABLET BY MOUTH THREE TIMES A DAY AS NEEDED FOR ANXIETY 90 tablet 5   Ascorbic Acid (VITAMIN C PO) Take 1 tablet by mouth daily.     aspirin EC 81 MG tablet Take 81 mg by mouth daily. Swallow whole.     atorvastatin (LIPITOR) 80 MG tablet TAKE  1 TABLET BY MOUTH DAILY. 90 tablet 3   b complex vitamins capsule Take 1 capsule by mouth 2 (two) times daily.     buPROPion (WELLBUTRIN XL) 300 MG 24 hr tablet Take 1 tablet (300 mg total) by mouth daily. 90 tablet 1   Cholecalciferol (VITAMIN D-3 PO) Take 1 capsule by mouth daily.     citalopram (CELEXA) 20 MG tablet Take 1 tablet (20 mg total) by mouth in the morning and at bedtime.     famotidine (PEPCID) 40 MG tablet Take 1 tablet (40 mg total) by mouth daily. 90 tablet 3   fluticasone (FLONASE) 50 MCG/ACT nasal spray SPRAY 2 SPRAYS INTO EACH NOSTRIL EVERY DAY 48 mL 1   hydrochlorothiazide (HYDRODIURIL) 12.5 MG tablet TAKE 1 TABLET BY MOUTH EVERY DAY 90 tablet 3   lamoTRIgine (LAMICTAL) 100 MG tablet TAKE 3 TABLETS (300 MG TOTAL) BY MOUTH AT BEDTIME 270 tablet 2   linaclotide (LINZESS) 72 MCG capsule Take 1 capsule (72 mcg total) by mouth daily before breakfast. 30 capsule 5   losartan (COZAAR) 25 MG tablet TAKE 1 TABLET BY MOUTH EVERY DAY 90 tablet 3   MAGNESIUM OXIDE PO Take 1 tablet by mouth daily.     meloxicam (MOBIC) 15 MG tablet TAKE 1 TABLET BY MOUTH ONCE DAILY AS NEEDED FOR JOINT PAIN 30 tablet 0   methocarbamol (ROBAXIN) 500 MG tablet Take 1 tablet (500 mg total) by mouth every 6 (six) hours as needed for muscle spasms. 40 tablet 0   metoprolol succinate (TOPROL-XL) 100 MG 24 hr tablet TAKE 1 TABLET BY MOUTH EVERY DAY WITH OR IMMEDIATELY FOLLOWING A MEAL 90 tablet 1   montelukast (SINGULAIR) 10 MG tablet TAKE 1 TABLET BY MOUTH EVERY DAY IN THE EVENING 90 tablet 1   Multiple Vitamins-Minerals (ZINC PO) Take 1 tablet by mouth daily.     nitroGLYCERIN (NITROSTAT) 0.4 MG SL tablet  Place 1 tablet (0.4 mg total) under the tongue every 5 (five) minutes x 3 doses as needed for chest pain. 25 tablet 3   oxyCODONE (OXY IR/ROXICODONE) 5 MG immediate release tablet 1 tab po tid prn severe pain 30 tablet 0   pantoprazole (PROTONIX) 40 MG tablet TAKE 1 TABLET (40 MG TOTAL) BY MOUTH DAILY. KEEP F/U APPT FOR ANY FURTHER REFILLS 30 tablet 5   potassium chloride SA (KLOR-CON M20) 20 MEQ tablet Take 1 tablet (20 mEq total) by mouth daily. 90 tablet 1   Tiotropium Bromide-Olodaterol (STIOLTO RESPIMAT) 2.5-2.5 MCG/ACT AERS INHALE 2 EACH INTO THE LUNGS DAILY. 4 g 11   zolpidem (AMBIEN) 10 MG tablet TAKE 1 TABLET BY MOUTH EVERYDAY AT BEDTIME 90 tablet 1   No facility-administered medications prior to visit.    Allergies  Allergen Reactions   Ceftin [Cefuroxime Axetil] Anaphylaxis and Swelling    Swelling of eyes and tongue   Cipro [Ciprofloxacin Hcl] Anaphylaxis and Swelling   Risperdal [Risperidone] Palpitations    Generalized weakness Malaise  Hypertension   Fosamax [Alendronate Sodium] Diarrhea   Morphine And Codeine Other (See Comments)    Hallucinations Disorientation    Prednisone Other (See Comments)    Hyperactivity   Roxicodone [Oxycodone] Other (See Comments)    Previously addicted to medication   Seroquel [Quetiapine] Other (See Comments)    Oversedation     Review of Systems As per HPI  PE:    01/04/2023    1:57 PM 12/15/2022    4:18 PM 11/30/2022   11:27 AM  Vitals with BMI  Height  5' 7.5" 5' 7.5"  Weight 175 lbs 3 oz 167 lbs 10 oz 168 lbs 13 oz  BMI 27.02 25.85 26.03  Systolic 152 118 875  Diastolic 85 69 74  Pulse 65 75 63     Physical Exam  Gen: Alert, well appearing.  Patient is oriented to person, place, time, and situation. Chest: Regular rhythm and rate without murmur or rub. Lungs show bibasilar inspiratory crackles.  Aeration is slightly diminished in these areas.  Otherwise lungs are clear and good aeration.  Breathing is  nonlabored. Abdomen soft and nontender and nondistended.  Bowel sounds are quiet but present.   LABS:  Last CBC Lab Results  Component Value Date   WBC 6.9 03/30/2022   HGB 11.2 (L) 03/30/2022   HCT 32.3 (L) 03/30/2022   MCV 89.3 03/30/2022   MCH 31.5 01/20/2022   RDW 13.1 03/30/2022   PLT 276.0 03/30/2022   Last metabolic panel Lab Results  Component Value Date   GLUCOSE 120 (H) 10/12/2022   NA 135 10/12/2022   K 4.0 10/12/2022   CL 100 10/12/2022   CO2 23 10/12/2022   BUN 17 10/12/2022   CREATININE 1.06 (H) 10/12/2022   GFR 54.31 (L) 03/30/2022   CALCIUM 9.6 10/12/2022   PROT 6.6 05/06/2021   ALBUMIN 4.6 05/06/2021   BILITOT 0.8 05/06/2021   ALKPHOS 55 05/06/2021   AST 23 05/06/2021   ALT 22 05/06/2021   ANIONGAP 5 01/20/2022   Last thyroid functions Lab Results  Component Value Date   TSH 0.91 10/12/2022   IMPRESSION AND PLAN:  #1 acute on chronic constipation.  Linzess caused diarrhea. Will get more aggressive with the MiraLAX: 1 capful every 4 hours until large bowel movement.  Also at the same time do a fleets enema.  Goal is to successful large evacuations.  She has magnesium sulfate to use at home if needed.  2.  Acute bronchopneumonia. Amoxil 875 twice daily x 10 days. Additionally, gave 80 mg Depo-Medrol today in the office.  3. bipolar disorder, mixed episode, unchanged/stable. She is tolerating recent increase Wellbutrin XL to 300 mg a day. She is maxed out on citalopram (20mg  bid) and lamictal (300 every day). She takes alprazolam 0.5 mg 3 times a day. Could consider adding Depakote in the future versus adding lithium or atypical antipsychotic.  An After Visit Summary was printed and given to the patient.  FOLLOW UP: 1 wk  Signed:  Santiago Bumpers, MD           01/04/2023

## 2023-01-06 ENCOUNTER — Other Ambulatory Visit: Payer: Self-pay | Admitting: Family Medicine

## 2023-01-06 NOTE — Telephone Encounter (Signed)
Last RF for Atorvastatin. Current request, too early.  TAKE 1 TABLET BY MOUTH DAILY., Normal  Dispense: 90 tablet  Refills: 3 ordered  Pharmacy: [id number] - [place], [place] - [address] (Ph: [phone])  Order Details Ordered on: 10/12/22  Authorizing provider: Jeoffrey Massed, MD

## 2023-01-12 ENCOUNTER — Encounter: Payer: Self-pay | Admitting: Family Medicine

## 2023-01-12 ENCOUNTER — Ambulatory Visit: Payer: Medicare HMO | Admitting: Family Medicine

## 2023-01-12 ENCOUNTER — Encounter: Payer: Medicare HMO | Admitting: Family Medicine

## 2023-01-12 ENCOUNTER — Ambulatory Visit (HOSPITAL_BASED_OUTPATIENT_CLINIC_OR_DEPARTMENT_OTHER)
Admission: RE | Admit: 2023-01-12 | Discharge: 2023-01-12 | Disposition: A | Discharge status: Home / Self Care | Payer: Medicare HMO | Source: Ambulatory Visit | Attending: Family Medicine | Admitting: Family Medicine

## 2023-01-12 VITALS — BP 134/83 | HR 71 | Temp 98.5°F | Wt 173.2 lb

## 2023-01-12 DIAGNOSIS — K5909 Other constipation: Secondary | ICD-10-CM

## 2023-01-12 DIAGNOSIS — R413 Other amnesia: Secondary | ICD-10-CM

## 2023-01-12 DIAGNOSIS — I700 Atherosclerosis of aorta: Secondary | ICD-10-CM | POA: Diagnosis not present

## 2023-01-12 DIAGNOSIS — F319 Bipolar disorder, unspecified: Secondary | ICD-10-CM | POA: Diagnosis not present

## 2023-01-12 DIAGNOSIS — R6889 Other general symptoms and signs: Secondary | ICD-10-CM | POA: Insufficient documentation

## 2023-01-12 DIAGNOSIS — R4789 Other speech disturbances: Secondary | ICD-10-CM

## 2023-01-12 DIAGNOSIS — R0602 Shortness of breath: Secondary | ICD-10-CM | POA: Diagnosis not present

## 2023-01-12 DIAGNOSIS — J180 Bronchopneumonia, unspecified organism: Secondary | ICD-10-CM

## 2023-01-12 DIAGNOSIS — F411 Generalized anxiety disorder: Secondary | ICD-10-CM

## 2023-01-12 DIAGNOSIS — R918 Other nonspecific abnormal finding of lung field: Secondary | ICD-10-CM | POA: Diagnosis not present

## 2023-01-12 NOTE — Progress Notes (Signed)
OFFICE VISIT  01/12/2023  CC:  Chief Complaint  Patient presents with   Follow-up    1 week follow up. She states her breathing is a little better but her blood pressure has still been high.    Patient is a 72 y.o. female who presents for 1 week follow-up pneumonia and constipation. I started her on Amoxil 875 mg twice daily x 10 days and gave 80 mg Depo-Medrol in the office. She was instructed to be more aggressive with the MiraLAX and to a fleets enema.  As a last resort she had magnesium sulfate to use. We did not make any changes to her psychotropic medication regimen.  INTERIM HX: Still with dry cough but no shortness of breath or wheezing.  No fever.  Still lots of concern about short-term memory and word finding difficulty. No racing thoughts or significantly depressed mood.  She is chronically anxious.  Easily distracted. No hallucinations or delusions.  She increased her MiraLAX after last visit and eventually started to have some loose stools and passing more gas. She was unable to do the enema.   Past Medical History:  Diagnosis Date   Alcoholism in remission [facility])    states last alcohol 11 yrs ago   Anemia    Anxiety    Bipolar 1 disorder (HCC)    Admission for mania x 2 (most recent 11/2017)   Cancer [facility])    Cervical spondylosis without myelopathy 12/19/2013   MRI 02/2014: multilevel DDD/spondylosis, not much change compared to prior MRI.  Pt not in favor of invasive therapy for her neck as of 04/2014.   Cholelithiasis    COPD (chronic obstructive pulmonary disease) (HCC)    Moderate: anoro started 04/2017 by pulm, pt symptomatically improved and PFTs stable at f/u 06/2017.   Coronary artery spasm (HCC) 07/11/2018   nitrates=HA. Amlodipine=intol swelling.  Changed to coreg 09/03/18 by cardiologist.   Depression    Dilutional hyponatremia    Endometritis 05/2017   possible; empiric tx with Flagyl by GYN.   Environmental allergies    Fibromyalgia    GERD  (gastroesophageal reflux disease)    Hepatic steatosis 03/2019   u/s abd   Herpes zoster 08/07/2017   L scapular region   Hiatal hernia    History of adenomatous polyp of colon 04/2008; 08/2014   No high grade dysplasia: recall 5 yrs (Dr. Bosie Clos, [facility])   History of basal cell carcinoma excision    NOSE   History of Helicobacter pylori infection 04/2008   +gastric biopsy (gastritis but no metaplasia, dysplasia, or malignancy identified)   History of kidney stones    History of TIA (transient ischemic attack)    secondary to HELLP syndrome 1994 post c/s--  residual memory loss   Hyperlipidemia    statin started after her NSTEMI: goal  LDL <70.   Hypertension    Internal hemorrhoids    Ischemic cardiomyopathy 05/2018   Echo EF 45-50% in context of NSTEMI from CA spasm. 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 Charlsie Merles 04/2017.   Leg pain    ABIs 11/2019: normal    Memory loss    Abnl MRI brain and CT brain c/w chronic microvascular ischemia.  Repeat MRI brain 02/2014 stable (Dr. Radonna Ricker with no plan to f/u  with neuro as of 04/2014)   NSTEMI (non-ST elevated myocardial infarction) (HCC) 05/2018   Echo EF 45-50% in context of NSTEMI from CA spasm.// Myoview 8/21: EF 66, no ischemia, low risk   Osteoarthritis of left wrist    STT and 1st [facility] jt   Osteoporosis 05/2010   2012 'penia; 06/2015 'porosis:  prolia.  DEXA 09/2017 T-score -2.2 femur neck. 11/2021 T score -2.2. Rpt 2 yrs   Pelvic floor dysfunction    [facility] (Dr. Marlou Porch)   Pneumonia    Postmenopausal vaginal bleeding 05/2017   x 1 small episode; GYN attempted endo bx but unable to penetrate cervix due to severe cerv stenosis (due to postmenopausal state + hx of LEEP).  Endo u/s  showed thin endo lining.  Per GYN---no evidence of endomet pathology on w/u---obs as of 07/2017.   Pre-diabetes    Prediabetes 06/2016   Fasting gluc 105; HbA1c at that time was 6.0%.  A1c 6.2% 12/2017.  A1c 5.7% Feb 2023.   Recurrent kidney stones 08/2013   Left ureteral calculus: Perc nephr--cystoscopy w/ureteroscopy + laser for stone removal.  Residual asymptomatic left renal nephrolithiasis <65mm present post-procedure.  Right sided hydronephrosis-persistent (on u/s)--urol ordered CT to further eval 08/2016: 1.6 cm nonobstructing stone lower pole left kidney, no hydro, plan for PCN extraction ([facility])   Shortness of breath    Sprain of neck 12/19/2013   Stroke (HCC)    TIA   TMJ (temporomandibular joint disorder)    USES  MOUTH GUARD   Toe fracture, left 12/2017   2nd toe prox phalanx (immobilization, Dr. Luciana Axe)   Tubular adenoma of colon    Vitamin D deficiency 05/2011   Weak urinary stream 07/2016   with elevated PVR and mild left hydronephrosis secondary to this ([facility] started flomax 0.4mg  qd for this).    Past Surgical History:  Procedure Laterality Date   ABIs  11/29/2019   NORMAL   ANTERIOR CERVICAL DECOMP/DISCECTOMY FUSION  06/30/2009   C3 -- C5  AND EXPLORATION OF FUSION C5-7 W/  PLATE REMOVAL   ARTHRODESIS METATARSALPHALANGEAL JOINT (MTPJ) Left 07/08/2021   Procedure: Left hallux metatarsophalangeal joint arthrodesis;  Surgeon: Toni Arthurs, MD;  Location: [facility];  Service: Orthopedics;  Laterality: Left;   BACK SURGERY     BUNIONECTOMY Left    CARDIOVASCULAR STRESS TEST  12/03/2019   myoc perf im: NORMAL   CERVICAL FUSION  2002   anterior C5 -- C7   CERVICAL SPINE SURGERY  08/27/1999     C6 -- T1  LAMINECTOMY/  DISKECTOMY   CESAREAN SECTION  1994   CHOLECYSTECTOMY N/A 03/06/2020   Procedure: LAPAROSCOPIC CHOLECYSTECTOMY;  Surgeon: Quentin Ore, MD;  Location: [facility];  Service: General;  Laterality: N/A;   COLONOSCOPY W/ POLYPECTOMY   04/2008;08/2014   2016 tubular adenoma x 1; +diverticulosis and int/ext hemorrhoids.  06/2020 adenoma, recall 5 yrs.   CYSTOSCOPY WITH URETEROSCOPY AND STENT PLACEMENT Left 08/28/2013   Procedure: CYSTOSCOPY, RGP,  WITH URETEROSCOPY AND STENT REMOVAL;  Surgeon: Valetta Fuller, MD;  Location: [facility];  Service: Urology;  Laterality: Left;   DEXA  09/2017   T-score -2.2 femur neck: improved compared to 06/2015.  2021 DEXA T score -2.3.  11/2021 DEXA T score -2.2.   ESOPHAGOGASTRODUODENOSCOPY  2010; 08/2014   2016 +Candidal esophagitis; Mild chronic gastritis w/intestinal metaplasia--NEG H pylori, neg for eosinophilic esoph. 06/2020 esoph candidiasis (diflucan x 20d), o/w normal.   HAMMER TOE SURGERY  Left 07/08/2021   Procedure: Second metatarsal head excision; revision Second hammertoe correction; 2-5 percutaneus flexor tenotomies;  Surgeon: Toni Arthurs, MD;  Location: [facility];  Service: Orthopedics;  Laterality: Left;   HARDWARE REMOVAL Left 07/08/2021   Procedure: Removal of deep implants from hallux proximal phalanx and First and Second metatarsals;  Surgeon: Toni Arthurs, MD;  Location: [facility];  Service: Orthopedics;  Laterality: Left;   HOLMIUM LASER APPLICATION Left 08/28/2013   Procedure: HOLMIUM LASER APPLICATION;  Surgeon: Valetta Fuller, MD;  Location: [facility];  Service: Urology;  Laterality: Left;   JOINT REPLACEMENT     LEFT HEART CATH AND CORONARY ANGIOGRAPHY N/A 05/15/2018   Evidence for spasm in distal LAD.  Mod RCA atherosclerosis, o/w no CAD.  Procedure: LEFT HEART CATH AND CORONARY ANGIOGRAPHY;  Surgeon: Marykay Lex, MD;  Location: [facility];  Service: Cardiovascular;  Laterality: N/A;   NEGATIVE SLEEP STUDY  04/01/2007   NEPHROLITHOTOMY Left 08/05/2013   Procedure: NEPHROLITHOTOMY PERCUTANEOUS;  Surgeon: Valetta Fuller, MD;  Location: [facility];  Service: Urology;  Laterality: Left;   POLYSOMNOGRAM   01/2019   NORMAL   POLYSOMNOGRAPHY  10/25/2018   Dr. Luetta Nutting due to pt inadequat sleep time (83 min).   TONSILLECTOMY AND ADENOIDECTOMY  1975   TOTAL KNEE ARTHROPLASTY Right 2003   TOTAL KNEE REVISION Right 01/19/2022   Procedure: Right knee polyethylene revision;  Surgeon: Ollen Gross, MD;  Location: [facility];  Service: Orthopedics;  Laterality: Right;   TRANSTHORACIC ECHOCARDIOGRAM  05/2018; 12/03/2019   (after MI secondary to arterial spasm): EF 45-50%; severe hypokinesis of apical anterolateral, inferolateral , anterior, and inferior LV myocardium. 11/2019 EF 55-60%, valves fine   ULTRASOUND EXAM,PELVIC COMPLETE ([facility] HX)  06/25/2015   NORMAL (done by GYN)    Outpatient Medications Prior to Visit  Medication Sig Dispense Refill   acetaminophen (TYLENOL) 500 MG tablet Take 500-1,000 mg by mouth daily as needed for mild pain or fever.     albuterol (VENTOLIN HFA) 108 (90 Base) MCG/ACT inhaler INHALE 2 PUFFS INTO THE LUNGS EVERY 4 HOURS AS NEEDED FOR WHEEZE OR FOR SHORTNESS OF BREATH (Patient taking differently: Inhale 2 puffs into the lungs every 6 (six) hours as needed for shortness of breath or wheezing.) 8.5 each 2   ALPRAZolam (XANAX) 0.5 MG tablet TAKE 1 TABLET BY MOUTH THREE TIMES A DAY AS NEEDED FOR ANXIETY 90 tablet 5   amoxicillin (AMOXIL) 875 MG tablet Take 1 tablet (875 mg total) by mouth 2 (two) times daily for 10 days. 20 tablet 0   Ascorbic Acid (VITAMIN C PO) Take 1 tablet by mouth daily.     aspirin EC 81 MG tablet Take 81 mg by mouth daily. Swallow whole.     atorvastatin (LIPITOR) 80 MG tablet TAKE 1 TABLET BY MOUTH DAILY. 90 tablet 3   b complex vitamins capsule Take 1 capsule by mouth 2 (two) times daily.     buPROPion (WELLBUTRIN XL) 300 MG 24 hr tablet Take 1 tablet (300 mg total) by mouth daily. 90 tablet 1   Cholecalciferol (VITAMIN D-3 PO) Take 1 capsule by mouth daily.     citalopram (CELEXA) 20 MG tablet Take 1 tablet (20 mg total) by mouth  in the morning and at bedtime.     famotidine (PEPCID) 40 MG tablet Take 1 tablet (40 mg total) by mouth daily. 90 tablet 3   fluticasone (FLONASE) 50 MCG/ACT nasal spray  SPRAY 2 SPRAYS INTO EACH NOSTRIL EVERY DAY 48 mL 1   hydrochlorothiazide (HYDRODIURIL) 12.5 MG tablet TAKE 1 TABLET BY MOUTH EVERY DAY 90 tablet 3   lamoTRIgine (LAMICTAL) 100 MG tablet TAKE 3 TABLETS (300 MG TOTAL) BY MOUTH AT BEDTIME 270 tablet 2   linaclotide (LINZESS) 72 MCG capsule Take 1 capsule (72 mcg total) by mouth daily before breakfast. 30 capsule 5   losartan (COZAAR) 25 MG tablet TAKE 1 TABLET BY MOUTH EVERY DAY 90 tablet 3   MAGNESIUM OXIDE PO Take 1 tablet by mouth daily.     meloxicam (MOBIC) 15 MG tablet TAKE 1 TABLET BY MOUTH ONCE DAILY AS NEEDED FOR JOINT PAIN 30 tablet 0   methocarbamol (ROBAXIN) 500 MG tablet Take 1 tablet (500 mg total) by mouth every 6 (six) hours as needed for muscle spasms. 40 tablet 0   metoprolol succinate (TOPROL-XL) 100 MG 24 hr tablet TAKE 1 TABLET BY MOUTH EVERY DAY WITH OR IMMEDIATELY FOLLOWING A MEAL 90 tablet 1   montelukast (SINGULAIR) 10 MG tablet TAKE 1 TABLET BY MOUTH EVERY DAY IN THE EVENING 90 tablet 1   Multiple Vitamins-Minerals (ZINC PO) Take 1 tablet by mouth daily.     nitroGLYCERIN (NITROSTAT) 0.4 MG SL tablet Place 1 tablet (0.4 mg total) under the tongue every 5 (five) minutes x 3 doses as needed for chest pain. 25 tablet 3   oxyCODONE (OXY IR/ROXICODONE) 5 MG immediate release tablet 1 tab po tid prn severe pain 30 tablet 0   pantoprazole (PROTONIX) 40 MG tablet TAKE 1 TABLET (40 MG TOTAL) BY MOUTH DAILY. KEEP F/U APPT FOR ANY FURTHER REFILLS 30 tablet 5   potassium chloride SA (KLOR-CON M20) 20 MEQ tablet Take 1 tablet (20 mEq total) by mouth daily. 90 tablet 1   Tiotropium Bromide-Olodaterol (STIOLTO RESPIMAT) 2.5-2.5 MCG/ACT AERS INHALE 2 EACH INTO THE LUNGS DAILY. 4 g 11   zolpidem (AMBIEN) 10 MG tablet TAKE 1 TABLET BY MOUTH EVERYDAY AT BEDTIME 90 tablet 1    No facility-administered medications prior to visit.    Allergies  Allergen Reactions   Ceftin [Cefuroxime Axetil] Anaphylaxis and Swelling    Swelling of eyes and tongue   Cipro [Ciprofloxacin Hcl] Anaphylaxis and Swelling   Risperdal [Risperidone] Palpitations    Generalized weakness Malaise  Hypertension   Fosamax [Alendronate Sodium] Diarrhea   Morphine And Codeine Other (See Comments)    Hallucinations Disorientation    Prednisone Other (See Comments)    Hyperactivity   Roxicodone [Oxycodone] Other (See Comments)    Previously addicted to medication   Seroquel [Quetiapine] Other (See Comments)    Oversedation     Review of Systems As per HPI  PE:    01/12/2023   10:52 AM 01/04/2023    1:57 PM 12/15/2022    4:18 PM  Vitals with BMI  Height   5' 7.5"  Weight 173 lbs 3 oz 175 lbs 3 oz 167 lbs 10 oz  BMI 26.71 27.02 25.85  Systolic 134 152 098  Diastolic 83 85 69  Pulse 71 65 75     Physical Exam  Gen: Alert, well appearing.  She attends normally.  Patient is oriented to person, place, time, and situation. AFFECT: pleasant, lucid thought and speech. Some word finding difficulty. Cardiovascular: Regular rhythm and rate without murmur. Lungs: Right basilar inspiratory crackles.  Otherwise lungs are clear and breathing is nonlabored.  LABS:  Last CBC Lab Results  Component Value Date   WBC 6.9  03/30/2022   HGB 11.2 (L) 03/30/2022   HCT 32.3 (L) 03/30/2022   MCV 89.3 03/30/2022   MCH 31.5 01/20/2022   RDW 13.1 03/30/2022   PLT 276.0 03/30/2022   Last metabolic panel Lab Results  Component Value Date   GLUCOSE 120 (H) 10/12/2022   NA 135 10/12/2022   K 4.0 10/12/2022   CL 100 10/12/2022   CO2 23 10/12/2022   BUN 17 10/12/2022   CREATININE 1.06 (H) 10/12/2022   GFR 54.31 (L) 03/30/2022   CALCIUM 9.6 10/12/2022   PROT 6.6 05/06/2021   ALBUMIN 4.6 05/06/2021   BILITOT 0.8 05/06/2021   ALKPHOS 55 05/06/2021   AST 23 05/06/2021   ALT 22  05/06/2021   ANIONGAP 5 01/20/2022   IMPRESSION AND PLAN:  #1 acute bronchopneumonia. Her symptoms have improved but crackles remain.  This could be within normal limits for her current clinical situation but would like to do chest x-ray--ordered today. She will finish her last 3 days of amoxicillin.  No additional antibiotics at this time.  2. acute on chronic constipation. This is improving.  She will continue increased fiber in diet, stool softener, and MiraLAX regularly.  3.  Bipolar 1 disorder, GAD. Her memory and word finding difficulties are likely secondary to a current mild mixed episode. Would like to get her set up for neurocognitive testing so we will look into this today.  An After Visit Summary was printed and given to the patient.  FOLLOW UP: Return in about 2 weeks (around 01/26/2023) for f/u pneumonia.  Signed:  Santiago Bumpers, MD           01/12/2023

## 2023-01-13 ENCOUNTER — Telehealth: Payer: Self-pay | Admitting: Family Medicine

## 2023-01-13 ENCOUNTER — Telehealth: Payer: Self-pay

## 2023-01-13 NOTE — Telephone Encounter (Signed)
Patient called to inquire about x-ray results. Patient is aware that she will be called when results have been reviewed by provider.

## 2023-01-13 NOTE — Telephone Encounter (Signed)
Noted  

## 2023-01-13 NOTE — Telephone Encounter (Signed)
Good morning [facility]!  I am reaching out regarding [facility] locations that may do neurocognitive testing. I tried contacting one of the locations yesterday but was unsuccessful. I called the [facility] behavioral [facility] and they confirmed they do not do any type of testing. I was told [name] does them and [name].   Can you assist with reaching out to the [name] sites and let me know of the update?

## 2023-01-17 NOTE — Telephone Encounter (Signed)
There is not currently a referral ordered. We were trying to figure out if there was a specific behavioral health facility with [place] that does neurocognitive testing or if the patient could go to any of the [facility] locations.

## 2023-01-19 ENCOUNTER — Encounter: Payer: Self-pay | Admitting: Family Medicine

## 2023-01-19 ENCOUNTER — Ambulatory Visit: Payer: Medicare HMO | Admitting: Family Medicine

## 2023-01-19 VITALS — BP 143/78 | HR 88 | Ht 67.0 in | Wt 170.2 lb

## 2023-01-19 DIAGNOSIS — F99 Mental disorder, not otherwise specified: Secondary | ICD-10-CM

## 2023-01-19 DIAGNOSIS — F319 Bipolar disorder, unspecified: Secondary | ICD-10-CM | POA: Diagnosis not present

## 2023-01-19 DIAGNOSIS — I10 Essential (primary) hypertension: Secondary | ICD-10-CM | POA: Diagnosis not present

## 2023-01-19 DIAGNOSIS — F411 Generalized anxiety disorder: Secondary | ICD-10-CM

## 2023-01-19 DIAGNOSIS — E7800 Pure hypercholesterolemia, unspecified: Secondary | ICD-10-CM

## 2023-01-19 DIAGNOSIS — F5105 Insomnia due to other mental disorder: Secondary | ICD-10-CM | POA: Diagnosis not present

## 2023-01-19 DIAGNOSIS — G3184 Mild cognitive impairment, so stated: Secondary | ICD-10-CM

## 2023-01-19 DIAGNOSIS — R413 Other amnesia: Secondary | ICD-10-CM | POA: Diagnosis not present

## 2023-01-19 LAB — LIPID PANEL
Cholesterol: 176 mg/dL (ref 0–200)
HDL: 70.6 mg/dL (ref >39.00)
LDL Cholesterol: 90 mg/dL (ref 0–99)
NonHDL: 105.59
Total CHOL/HDL Ratio: 2
Triglycerides: 76 mg/dL (ref 0.0–149.0)
VLDL: 15.2 mg/dL (ref 0.0–40.0)

## 2023-01-19 LAB — COMPREHENSIVE METABOLIC PANEL
ALT: 20 U/L (ref 0–35)
AST: 18 U/L (ref 0–37)
Albumin: 4.8 g/dL (ref 3.5–5.2)
Alkaline Phosphatase: 72 U/L (ref 39–117)
BUN: 19 mg/dL (ref 6–23)
CO2: 29 meq/L (ref 19–32)
Calcium: 9.9 mg/dL (ref 8.4–10.5)
Chloride: 98 meq/L (ref 96–112)
Creatinine, Ser: 1 mg/dL (ref 0.40–1.20)
GFR: 57.26 mL/min — ABNORMAL LOW (ref >60.00)
Glucose, Bld: 88 mg/dL (ref 70–99)
Potassium: 4.2 meq/L (ref 3.5–5.1)
Sodium: 136 meq/L (ref 135–145)
Total Bilirubin: 0.7 mg/dL (ref 0.2–1.2)
Total Protein: 6.8 g/dL (ref 6.0–8.3)

## 2023-01-19 MED ORDER — ALBUTEROL SULFATE HFA 108 (90 BASE) MCG/ACT IN AERS: 2.0000 | INHALATION_SPRAY | Freq: Four times a day (QID) | RESPIRATORY_TRACT | 0 refills | Status: DC | PRN

## 2023-01-19 NOTE — Progress Notes (Signed)
OFFICE VISIT  01/19/2023  CC:  Chief Complaint  Patient presents with   Annual Exam    Patient is a 72 y.o. female who presents for follow-up hypertension, GAD, bipolar disorder,   INTERIM HX: She feels pretty stable from an emotional/mood/anxiety standpoint. Her cough is resolving.  She ate a blueberry smoothie about 2 hours ago.  Last visit we discussed the need for neuropsychiatric testing to help delineate whether her word finding difficulty and memory impairment is related to some beginning of neurodegenerative disease or more likely related to her mood and anxiety disorders.  ROS as above, plus--> no fevers, no CP, no SOB, no wheezing, no dizziness, no HAs, no rashes, no melena/hematochezia.  No polyuria or polydipsia.  No myalgias or arthralgias.  No focal weakness, paresthesias, or tremors.  No acute vision or hearing abnormalities.  No dysuria or unusual/new urinary urgency or frequency.  No recent changes in lower legs. No n/v/d or abd pain.  No palpitations.     Past Medical History:  Diagnosis Date   Alcoholism in remission [facility])    states last alcohol 11 yrs ago   Anemia    Anxiety    Bipolar 1 disorder (HCC)    Admission for mania x 2 (most recent 11/2017)   Cancer [facility])    Cervical spondylosis without myelopathy 12/19/2013   MRI 02/2014: multilevel DDD/spondylosis, not much change compared to prior MRI.  Pt not in favor of invasive therapy for her neck as of 04/2014.   Cholelithiasis    COPD (chronic obstructive pulmonary disease) (HCC)    Moderate: anoro started 04/2017 by pulm, pt symptomatically improved and PFTs stable at f/u 06/2017.   Coronary artery spasm (HCC) 07/11/2018   nitrates=HA. Amlodipine=intol swelling.  Changed to coreg 09/03/18 by cardiologist.   Depression    Dilutional hyponatremia    Endometritis 05/2017   possible; empiric tx with Flagyl by GYN.   Environmental allergies    Fibromyalgia    GERD (gastroesophageal reflux disease)    Hepatic  steatosis 03/2019   u/s abd   Herpes zoster 08/07/2017   L scapular region   Hiatal hernia    History of adenomatous polyp of colon 04/2008; 08/2014   No high grade dysplasia: recall 5 yrs (Dr. Bosie Clos, [facility])   History of basal cell carcinoma excision    NOSE   History of Helicobacter pylori infection 04/2008   +gastric biopsy (gastritis but no metaplasia, dysplasia, or malignancy identified)   History of kidney stones    History of TIA (transient ischemic attack)    secondary to HELLP syndrome 1994 post c/s--  residual memory loss   Hyperlipidemia    statin started after her NSTEMI: goal  LDL <70.   Hypertension    Internal hemorrhoids    Ischemic cardiomyopathy 05/2018   Echo EF 45-50% in context of NSTEMI from CA spasm. 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 Charlsie Merles 04/2017.   Leg pain    ABIs 11/2019: normal    Memory loss    Abnl MRI brain and CT brain c/w chronic microvascular ischemia.  Repeat MRI brain 02/2014 stable (Dr. Radonna Ricker with no plan to f/u with neuro as of 04/2014)   NSTEMI (non-ST elevated myocardial infarction) (HCC) 05/2018   Echo EF 45-50%  in context of NSTEMI from CA spasm.// Myoview 8/21: EF 66, no ischemia, low risk   Osteoarthritis of left wrist    STT and 1st [facility] jt   Osteoporosis 05/2010   2012 'penia; 06/2015 'porosis:  prolia.  DEXA 09/2017 T-score -2.2 femur neck. 11/2021 T score -2.2. Rpt 2 yrs   Pelvic floor dysfunction    [facility] (Dr. Marlou Porch)   Pneumonia    Postmenopausal vaginal bleeding 05/2017   x 1 small episode; GYN attempted endo bx but unable to penetrate cervix due to severe cerv stenosis (due to postmenopausal state + hx of LEEP).  Endo u/s showed thin endo lining.  Per GYN---no evidence of  endomet pathology on w/u---obs as of 07/2017.   Pre-diabetes    Prediabetes 06/2016   Fasting gluc 105; HbA1c at that time was 6.0%.  A1c 6.2% 12/2017.  A1c 5.7% Feb 2023.   Recurrent kidney stones 08/2013   Left ureteral calculus: Perc nephr--cystoscopy w/ureteroscopy + laser for stone removal.  Residual asymptomatic left renal nephrolithiasis <31mm present post-procedure.  Right sided hydronephrosis-persistent (on u/s)--urol ordered CT to further eval 08/2016: 1.6 cm nonobstructing stone lower pole left kidney, no hydro, plan for PCN extraction ([facility])   Shortness of breath    Sprain of neck 12/19/2013   Stroke (HCC)    TIA   TMJ (temporomandibular joint disorder)    USES  MOUTH GUARD   Toe fracture, left 12/2017   2nd toe prox phalanx (immobilization, Dr. Luciana Axe)   Tubular adenoma of colon    Vitamin D deficiency 05/2011   Weak urinary stream 07/2016   with elevated PVR and mild left hydronephrosis secondary to this ([facility] started flomax 0.4mg  qd for this).    Past Surgical History:  Procedure Laterality Date   ABIs  11/29/2019   NORMAL   ANTERIOR CERVICAL DECOMP/DISCECTOMY FUSION  06/30/2009   C3 -- C5  AND EXPLORATION OF FUSION C5-7 W/  PLATE REMOVAL   ARTHRODESIS METATARSALPHALANGEAL JOINT (MTPJ) Left 07/08/2021   Procedure: Left hallux metatarsophalangeal joint arthrodesis;  Surgeon: Toni Arthurs, MD;  Location: [facility];  Service: Orthopedics;  Laterality: Left;   BACK SURGERY     BUNIONECTOMY Left    CARDIOVASCULAR STRESS TEST  12/03/2019   myoc perf im: NORMAL   CERVICAL FUSION  2002   anterior C5 -- C7   CERVICAL SPINE SURGERY  08/27/1999     C6 -- T1  LAMINECTOMY/  DISKECTOMY   CESAREAN SECTION  1994   CHOLECYSTECTOMY N/A 03/06/2020   Procedure: LAPAROSCOPIC CHOLECYSTECTOMY;  Surgeon: Quentin Ore, MD;  Location: [facility];  Service: General;  Laterality: N/A;   COLONOSCOPY W/ POLYPECTOMY  04/2008;08/2014   2016 tubular adenoma x 1;  +diverticulosis and int/ext hemorrhoids.  06/2020 adenoma, recall 5 yrs.   CYSTOSCOPY WITH URETEROSCOPY AND STENT PLACEMENT Left 08/28/2013   Procedure: CYSTOSCOPY, RGP,  WITH URETEROSCOPY AND STENT REMOVAL;  Surgeon: Valetta Fuller, MD;  Location: [facility];  Service: Urology;  Laterality: Left;   DEXA  09/2017   T-score -2.2 femur neck: improved compared to 06/2015.  2021 DEXA T score -2.3.  11/2021 DEXA T score -2.2.   ESOPHAGOGASTRODUODENOSCOPY  2010; 08/2014   2016 +Candidal esophagitis; Mild chronic gastritis w/intestinal metaplasia--NEG H pylori, neg for eosinophilic esoph. 06/2020 esoph candidiasis (diflucan x 20d), o/w normal.   HAMMER TOE SURGERY Left 07/08/2021   Procedure: Second metatarsal head excision; revision Second hammertoe correction; 2-5 percutaneus flexor tenotomies;  Surgeon:  Toni Arthurs, MD;  Location: [facility];  Service: Orthopedics;  Laterality: Left;   HARDWARE REMOVAL Left 07/08/2021   Procedure: Removal of deep implants from hallux proximal phalanx and First and Second metatarsals;  Surgeon: Toni Arthurs, MD;  Location: [facility];  Service: Orthopedics;  Laterality: Left;   HOLMIUM LASER APPLICATION Left 08/28/2013   Procedure: HOLMIUM LASER APPLICATION;  Surgeon: Valetta Fuller, MD;  Location: [facility];  Service: Urology;  Laterality: Left;   JOINT REPLACEMENT     LEFT HEART CATH AND CORONARY ANGIOGRAPHY N/A 05/15/2018   Evidence for spasm in distal LAD.  Mod RCA atherosclerosis, o/w no CAD.  Procedure: LEFT HEART CATH AND CORONARY ANGIOGRAPHY;  Surgeon: Marykay Lex, MD;  Location: [facility];  Service: Cardiovascular;  Laterality: N/A;   NEGATIVE SLEEP STUDY  04/01/2007   NEPHROLITHOTOMY Left 08/05/2013   Procedure: NEPHROLITHOTOMY PERCUTANEOUS;  Surgeon: Valetta Fuller, MD;  Location: [facility];  Service: Urology;  Laterality: Left;   POLYSOMNOGRAM  01/2019   NORMAL   POLYSOMNOGRAPHY   10/25/2018   Dr. Luetta Nutting due to pt inadequat sleep time (83 min).   TONSILLECTOMY AND ADENOIDECTOMY  1975   TOTAL KNEE ARTHROPLASTY Right 2003   TOTAL KNEE REVISION Right 01/19/2022   Procedure: Right knee polyethylene revision;  Surgeon: Ollen Gross, MD;  Location: [facility];  Service: Orthopedics;  Laterality: Right;   TRANSTHORACIC ECHOCARDIOGRAM  05/2018; 12/03/2019   (after MI secondary to arterial spasm): EF 45-50%; severe hypokinesis of apical anterolateral, inferolateral , anterior, and inferior LV myocardium. 11/2019 EF 55-60%, valves fine   ULTRASOUND EXAM,PELVIC COMPLETE ([facility] HX)  06/25/2015   NORMAL (done by GYN)    Outpatient Medications Prior to Visit  Medication Sig Dispense Refill   acetaminophen (TYLENOL) 500 MG tablet Take 500-1,000 mg by mouth daily as needed for mild pain or fever.     albuterol (VENTOLIN HFA) 108 (90 Base) MCG/ACT inhaler INHALE 2 PUFFS INTO THE LUNGS EVERY 4 HOURS AS NEEDED FOR WHEEZE OR FOR SHORTNESS OF BREATH (Patient taking differently: Inhale 2 puffs into the lungs every 6 (six) hours as needed for shortness of breath or wheezing.) 8.5 each 2   ALPRAZolam (XANAX) 0.5 MG tablet TAKE 1 TABLET BY MOUTH THREE TIMES A DAY AS NEEDED FOR ANXIETY 90 tablet 5   amoxicillin (AMOXIL) 500 MG capsule Take 500 mg by mouth daily.     Ascorbic Acid (VITAMIN C PO) Take 1 tablet by mouth daily.     aspirin EC 81 MG tablet Take 81 mg by mouth daily. Swallow whole.     atorvastatin (LIPITOR) 80 MG tablet TAKE 1 TABLET BY MOUTH DAILY. 90 tablet 3   b complex vitamins capsule Take 1 capsule by mouth 2 (two) times daily.     buPROPion (WELLBUTRIN XL) 150 MG 24 hr tablet Take 150 mg by mouth daily.     buPROPion (WELLBUTRIN XL) 300 MG 24 hr tablet Take 1 tablet (300 mg total) by mouth daily. 90 tablet 1   Cholecalciferol (VITAMIN D-3 PO) Take 1 capsule by mouth daily.     citalopram (CELEXA) 20 MG tablet Take 1 tablet (20 mg total) by mouth in the  morning and at bedtime.     famotidine (PEPCID) 40 MG tablet Take 1 tablet (40 mg total) by mouth daily. 90 tablet 3   fluticasone (FLONASE) 50 MCG/ACT nasal spray SPRAY 2 SPRAYS INTO EACH NOSTRIL EVERY DAY 48 mL 1  hydrochlorothiazide (HYDRODIURIL) 12.5 MG tablet TAKE 1 TABLET BY MOUTH EVERY DAY 90 tablet 3   lamoTRIgine (LAMICTAL) 100 MG tablet TAKE 3 TABLETS (300 MG TOTAL) BY MOUTH AT BEDTIME 270 tablet 2   losartan (COZAAR) 25 MG tablet TAKE 1 TABLET BY MOUTH EVERY DAY 90 tablet 3   MAGNESIUM OXIDE PO Take 1 tablet by mouth daily.     meloxicam (MOBIC) 15 MG tablet TAKE 1 TABLET BY MOUTH ONCE DAILY AS NEEDED FOR JOINT PAIN 30 tablet 0   methocarbamol (ROBAXIN) 500 MG tablet Take 1 tablet (500 mg total) by mouth every 6 (six) hours as needed for muscle spasms. 40 tablet 0   metoprolol succinate (TOPROL-XL) 100 MG 24 hr tablet TAKE 1 TABLET BY MOUTH EVERY DAY WITH OR IMMEDIATELY FOLLOWING A MEAL 90 tablet 1   montelukast (SINGULAIR) 10 MG tablet TAKE 1 TABLET BY MOUTH EVERY DAY IN THE EVENING 90 tablet 1   Multiple Vitamins-Minerals (ZINC PO) Take 1 tablet by mouth daily.     nitroGLYCERIN (NITROSTAT) 0.4 MG SL tablet Place 1 tablet (0.4 mg total) under the tongue every 5 (five) minutes x 3 doses as needed for chest pain. 25 tablet 3   oxyCODONE (OXY IR/ROXICODONE) 5 MG immediate release tablet 1 tab po tid prn severe pain 30 tablet 0   pantoprazole (PROTONIX) 40 MG tablet TAKE 1 TABLET (40 MG TOTAL) BY MOUTH DAILY. KEEP F/U APPT FOR ANY FURTHER REFILLS 30 tablet 5   potassium chloride SA (KLOR-CON M20) 20 MEQ tablet Take 1 tablet (20 mEq total) by mouth daily. 90 tablet 1   Tiotropium Bromide-Olodaterol (STIOLTO RESPIMAT) 2.5-2.5 MCG/ACT AERS INHALE 2 EACH INTO THE LUNGS DAILY. 4 g 11   zolpidem (AMBIEN) 10 MG tablet TAKE 1 TABLET BY MOUTH EVERYDAY AT BEDTIME 90 tablet 1   linaclotide (LINZESS) 72 MCG capsule Take 1 capsule (72 mcg total) by mouth daily before breakfast. (Patient not taking:  Reported on 01/19/2023) 30 capsule 5   No facility-administered medications prior to visit.    Allergies  Allergen Reactions   Ceftin [Cefuroxime Axetil] Anaphylaxis and Swelling    Swelling of eyes and tongue   Cipro [Ciprofloxacin Hcl] Anaphylaxis and Swelling   Risperdal [Risperidone] Palpitations    Generalized weakness Malaise  Hypertension   Fosamax [Alendronate Sodium] Diarrhea   Morphine And Codeine Other (See Comments)    Hallucinations Disorientation    Prednisone Other (See Comments)    Hyperactivity   Roxicodone [Oxycodone] Other (See Comments)    Previously addicted to medication   Seroquel [Quetiapine] Other (See Comments)    Oversedation     Review of Systems As per HPI  PE:    01/19/2023    1:10 PM 01/12/2023   10:52 AM 01/04/2023    1:57 PM  Vitals with BMI  Height 5\' 7"     Weight 170 lbs 3 oz 173 lbs 3 oz 175 lbs 3 oz  BMI 26.65 26.71 27.02  Systolic 143 134 161  Diastolic 78 83 85  Pulse 88 71 65     Physical Exam  Gen: Alert, well appearing.  Patient is oriented to person, place, time, and situation. AFFECT: pleasant, lucid thought and speech. No further exam today.  LABS:  Last metabolic panel Lab Results  Component Value Date   GLUCOSE 120 (H) 10/12/2022   NA 135 10/12/2022   K 4.0 10/12/2022   CL 100 10/12/2022   CO2 23 10/12/2022   BUN 17 10/12/2022   CREATININE 1.06 (  H) 10/12/2022   GFR 54.31 (L) 03/30/2022   CALCIUM 9.6 10/12/2022   PROT 6.6 05/06/2021   ALBUMIN 4.6 05/06/2021   BILITOT 0.8 05/06/2021   ALKPHOS 55 05/06/2021   AST 23 05/06/2021   ALT 22 05/06/2021   ANIONGAP 5 01/20/2022   Lab Results  Component Value Date   WBC 6.9 03/30/2022   HGB 11.2 (L) 03/30/2022   HCT 32.3 (L) 03/30/2022   MCV 89.3 03/30/2022   PLT 276.0 03/30/2022   Lab Results  Component Value Date   IRON 84 04/29/2014   FERRITIN 27.6 04/29/2014   Last lipids Lab Results  Component Value Date   CHOL 151 05/06/2021   HDL 58.90  05/06/2021   LDLCALC 73 05/06/2021   LDLDIRECT 144.1 05/29/2013   TRIG 98.0 05/06/2021   CHOLHDL 3 05/06/2021   Last thyroid functions Lab Results  Component Value Date   TSH 0.91 10/12/2022   Lab Results  Component Value Date   HGBA1C 5.5 01/06/2022   Lab Results  Component Value Date   VITAMINB12 688 12/11/2017   IMPRESSION AND PLAN:  #1 Word finding difficulty, short-term memory impairment. This is mild but bothers her significantly. Ordered referral to neuropsychology for neuropsychiatric testing.  #2 bipolar disorder, GAD. Continue Wellbutrin XL 300 mg a day, citalopram 20 mg a day, and Lamictal 300 mg a day.  Also continue alprazolam 0.5 mg 3 times daily as needed. Continue Ambien 10 mg nightly for insomnia.  3.  Acute bronchopneumonia, resolving appropriately.  At last visit her lung crackles remained. Chest x-ray was done 01/12/2023 and is normal per my interpretation.  Awaiting radiologist overread.  4.  Hypertension, well-controlled on Toprol-XL 100 mg a day, losartan 25 mg a day, and HCTZ 12.5 mg a day.  #5 hypercholesterolemia, doing well on a atorvastatin 80 mg a day. Lipid panel and hepatic panel today.  An After Visit Summary was printed and given to the patient.  FOLLOW UP: Return in about 6 weeks (around 03/02/2023) for routine chronic illness f/u. Next CPE 3 months Signed:  Santiago Bumpers, MD           01/19/2023

## 2023-01-19 NOTE — Patient Instructions (Signed)
[facility] [address], [address] [place], [place] [address] [phone]

## 2023-01-21 ENCOUNTER — Other Ambulatory Visit: Payer: Self-pay | Admitting: Family Medicine

## 2023-01-26 ENCOUNTER — Ambulatory Visit: Payer: Medicare HMO | Admitting: Family Medicine

## 2023-01-28 ENCOUNTER — Encounter (HOSPITAL_COMMUNITY)
Admission: EM | Disposition: A | Discharge status: Skilled Nursing Facility | Payer: Self-pay | Source: Home / Self Care | Attending: Internal Medicine

## 2023-01-28 ENCOUNTER — Inpatient Hospital Stay (HOSPITAL_COMMUNITY): Payer: Medicare HMO | Admitting: Anesthesiology

## 2023-01-28 ENCOUNTER — Inpatient Hospital Stay (HOSPITAL_COMMUNITY): Payer: Medicare HMO

## 2023-01-28 ENCOUNTER — Emergency Department (HOSPITAL_COMMUNITY): Payer: Medicare HMO

## 2023-01-28 ENCOUNTER — Other Ambulatory Visit: Payer: Self-pay

## 2023-01-28 ENCOUNTER — Encounter (HOSPITAL_COMMUNITY): Payer: Self-pay | Admitting: Emergency Medicine

## 2023-01-28 ENCOUNTER — Inpatient Hospital Stay (HOSPITAL_COMMUNITY)
Admission: EM | Admit: 2023-01-28 | Discharge: 2023-02-01 | DRG: 853 | Disposition: A | Discharge status: Skilled Nursing Facility | Payer: Medicare HMO | Attending: Internal Medicine | Admitting: Internal Medicine

## 2023-01-28 DIAGNOSIS — E559 Vitamin D deficiency, unspecified: Secondary | ICD-10-CM | POA: Diagnosis present

## 2023-01-28 DIAGNOSIS — J449 Chronic obstructive pulmonary disease, unspecified: Secondary | ICD-10-CM | POA: Diagnosis present

## 2023-01-28 DIAGNOSIS — G319 Degenerative disease of nervous system, unspecified: Secondary | ICD-10-CM | POA: Diagnosis not present

## 2023-01-28 DIAGNOSIS — E876 Hypokalemia: Secondary | ICD-10-CM | POA: Diagnosis present

## 2023-01-28 DIAGNOSIS — R7303 Prediabetes: Secondary | ICD-10-CM | POA: Diagnosis present

## 2023-01-28 DIAGNOSIS — Z7982 Long term (current) use of aspirin: Secondary | ICD-10-CM

## 2023-01-28 DIAGNOSIS — A4151 Sepsis due to Escherichia coli [E. coli]: Secondary | ICD-10-CM | POA: Diagnosis not present

## 2023-01-28 DIAGNOSIS — I700 Atherosclerosis of aorta: Secondary | ICD-10-CM | POA: Diagnosis not present

## 2023-01-28 DIAGNOSIS — M797 Fibromyalgia: Secondary | ICD-10-CM | POA: Diagnosis present

## 2023-01-28 DIAGNOSIS — D631 Anemia in chronic kidney disease: Secondary | ICD-10-CM | POA: Diagnosis present

## 2023-01-28 DIAGNOSIS — Z79891 Long term (current) use of opiate analgesic: Secondary | ICD-10-CM

## 2023-01-28 DIAGNOSIS — Z79899 Other long term (current) drug therapy: Secondary | ICD-10-CM

## 2023-01-28 DIAGNOSIS — N390 Urinary tract infection, site not specified: Secondary | ICD-10-CM | POA: Diagnosis present

## 2023-01-28 DIAGNOSIS — Y92009 Unspecified place in unspecified non-institutional (private) residence as the place of occurrence of the external cause: Secondary | ICD-10-CM

## 2023-01-28 DIAGNOSIS — E860 Dehydration: Secondary | ICD-10-CM | POA: Diagnosis not present

## 2023-01-28 DIAGNOSIS — Z85828 Personal history of other malignant neoplasm of skin: Secondary | ICD-10-CM | POA: Diagnosis not present

## 2023-01-28 DIAGNOSIS — W010XXA Fall on same level from slipping, tripping and stumbling without subsequent striking against object, initial encounter: Secondary | ICD-10-CM | POA: Diagnosis present

## 2023-01-28 DIAGNOSIS — N179 Acute kidney failure, unspecified: Secondary | ICD-10-CM | POA: Diagnosis not present

## 2023-01-28 DIAGNOSIS — M79604 Pain in right leg: Secondary | ICD-10-CM | POA: Diagnosis not present

## 2023-01-28 DIAGNOSIS — E7800 Pure hypercholesterolemia, unspecified: Secondary | ICD-10-CM

## 2023-01-28 DIAGNOSIS — E871 Hypo-osmolality and hyponatremia: Secondary | ICD-10-CM | POA: Diagnosis present

## 2023-01-28 DIAGNOSIS — S72031A Displaced midcervical fracture of right femur, initial encounter for closed fracture: Secondary | ICD-10-CM | POA: Diagnosis present

## 2023-01-28 DIAGNOSIS — Z1152 Encounter for screening for COVID-19: Secondary | ICD-10-CM | POA: Diagnosis not present

## 2023-01-28 DIAGNOSIS — K219 Gastro-esophageal reflux disease without esophagitis: Secondary | ICD-10-CM | POA: Diagnosis present

## 2023-01-28 DIAGNOSIS — W19XXXA Unspecified fall, initial encounter: Secondary | ICD-10-CM | POA: Diagnosis not present

## 2023-01-28 DIAGNOSIS — M542 Cervicalgia: Secondary | ICD-10-CM | POA: Diagnosis not present

## 2023-01-28 DIAGNOSIS — Y9301 Activity, walking, marching and hiking: Secondary | ICD-10-CM | POA: Diagnosis present

## 2023-01-28 DIAGNOSIS — R413 Other amnesia: Secondary | ICD-10-CM | POA: Diagnosis not present

## 2023-01-28 DIAGNOSIS — R748 Abnormal levels of other serum enzymes: Secondary | ICD-10-CM | POA: Insufficient documentation

## 2023-01-28 DIAGNOSIS — B9620 Unspecified Escherichia coli [E. coli] as the cause of diseases classified elsewhere: Secondary | ICD-10-CM | POA: Diagnosis not present

## 2023-01-28 DIAGNOSIS — Z885 Allergy status to narcotic agent status: Secondary | ICD-10-CM

## 2023-01-28 DIAGNOSIS — R0902 Hypoxemia: Secondary | ICD-10-CM | POA: Diagnosis not present

## 2023-01-28 DIAGNOSIS — R9431 Abnormal electrocardiogram [ECG] [EKG]: Secondary | ICD-10-CM | POA: Diagnosis not present

## 2023-01-28 DIAGNOSIS — S0990XA Unspecified injury of head, initial encounter: Secondary | ICD-10-CM | POA: Diagnosis not present

## 2023-01-28 DIAGNOSIS — Z811 Family history of alcohol abuse and dependence: Secondary | ICD-10-CM

## 2023-01-28 DIAGNOSIS — G8929 Other chronic pain: Secondary | ICD-10-CM | POA: Diagnosis not present

## 2023-01-28 DIAGNOSIS — Z888 Allergy status to other drugs, medicaments and biological substances status: Secondary | ICD-10-CM

## 2023-01-28 DIAGNOSIS — Z8619 Personal history of other infectious and parasitic diseases: Secondary | ICD-10-CM

## 2023-01-28 DIAGNOSIS — Z881 Allergy status to other antibiotic agents status: Secondary | ICD-10-CM

## 2023-01-28 DIAGNOSIS — Z8673 Personal history of transient ischemic attack (TIA), and cerebral infarction without residual deficits: Secondary | ICD-10-CM

## 2023-01-28 DIAGNOSIS — F319 Bipolar disorder, unspecified: Secondary | ICD-10-CM | POA: Diagnosis present

## 2023-01-28 DIAGNOSIS — Z743 Need for continuous supervision: Secondary | ICD-10-CM | POA: Diagnosis not present

## 2023-01-28 DIAGNOSIS — N3000 Acute cystitis without hematuria: Secondary | ICD-10-CM | POA: Diagnosis not present

## 2023-01-28 DIAGNOSIS — N182 Chronic kidney disease, stage 2 (mild): Secondary | ICD-10-CM | POA: Diagnosis present

## 2023-01-28 DIAGNOSIS — Z0389 Encounter for observation for other suspected diseases and conditions ruled out: Secondary | ICD-10-CM | POA: Diagnosis not present

## 2023-01-28 DIAGNOSIS — T796XXA Traumatic ischemia of muscle, initial encounter: Secondary | ICD-10-CM | POA: Diagnosis present

## 2023-01-28 DIAGNOSIS — I129 Hypertensive chronic kidney disease with stage 1 through stage 4 chronic kidney disease, or unspecified chronic kidney disease: Secondary | ICD-10-CM | POA: Diagnosis present

## 2023-01-28 DIAGNOSIS — R6520 Severe sepsis without septic shock: Secondary | ICD-10-CM | POA: Diagnosis present

## 2023-01-28 DIAGNOSIS — E872 Acidosis, unspecified: Secondary | ICD-10-CM | POA: Diagnosis not present

## 2023-01-28 DIAGNOSIS — G4700 Insomnia, unspecified: Secondary | ICD-10-CM | POA: Diagnosis not present

## 2023-01-28 DIAGNOSIS — F1021 Alcohol dependence, in remission: Secondary | ICD-10-CM | POA: Diagnosis not present

## 2023-01-28 DIAGNOSIS — Z96651 Presence of right artificial knee joint: Secondary | ICD-10-CM | POA: Diagnosis present

## 2023-01-28 DIAGNOSIS — Z8261 Family history of arthritis: Secondary | ICD-10-CM

## 2023-01-28 DIAGNOSIS — Z7401 Bed confinement status: Secondary | ICD-10-CM | POA: Diagnosis not present

## 2023-01-28 DIAGNOSIS — Z87891 Personal history of nicotine dependence: Secondary | ICD-10-CM

## 2023-01-28 DIAGNOSIS — I10 Essential (primary) hypertension: Secondary | ICD-10-CM | POA: Diagnosis present

## 2023-01-28 DIAGNOSIS — M19032 Primary osteoarthritis, left wrist: Secondary | ICD-10-CM | POA: Diagnosis present

## 2023-01-28 DIAGNOSIS — Z87442 Personal history of urinary calculi: Secondary | ICD-10-CM

## 2023-01-28 DIAGNOSIS — K760 Fatty (change of) liver, not elsewhere classified: Secondary | ICD-10-CM | POA: Diagnosis present

## 2023-01-28 DIAGNOSIS — S199XXA Unspecified injury of neck, initial encounter: Secondary | ICD-10-CM | POA: Diagnosis not present

## 2023-01-28 DIAGNOSIS — F419 Anxiety disorder, unspecified: Secondary | ICD-10-CM | POA: Diagnosis present

## 2023-01-28 DIAGNOSIS — M25451 Effusion, right hip: Secondary | ICD-10-CM | POA: Diagnosis not present

## 2023-01-28 DIAGNOSIS — E785 Hyperlipidemia, unspecified: Secondary | ICD-10-CM | POA: Diagnosis present

## 2023-01-28 DIAGNOSIS — I2510 Atherosclerotic heart disease of native coronary artery without angina pectoris: Secondary | ICD-10-CM | POA: Diagnosis not present

## 2023-01-28 DIAGNOSIS — D649 Anemia, unspecified: Secondary | ICD-10-CM

## 2023-01-28 DIAGNOSIS — Z981 Arthrodesis status: Secondary | ICD-10-CM | POA: Diagnosis not present

## 2023-01-28 DIAGNOSIS — Z833 Family history of diabetes mellitus: Secondary | ICD-10-CM

## 2023-01-28 DIAGNOSIS — S79911A Unspecified injury of right hip, initial encounter: Secondary | ICD-10-CM | POA: Diagnosis not present

## 2023-01-28 DIAGNOSIS — R7881 Bacteremia: Secondary | ICD-10-CM | POA: Diagnosis not present

## 2023-01-28 DIAGNOSIS — Z860101 Personal history of adenomatous and serrated colon polyps: Secondary | ICD-10-CM

## 2023-01-28 DIAGNOSIS — Z83438 Family history of other disorder of lipoprotein metabolism and other lipidemia: Secondary | ICD-10-CM

## 2023-01-28 DIAGNOSIS — S72001D Fracture of unspecified part of neck of right femur, subsequent encounter for closed fracture with routine healing: Secondary | ICD-10-CM | POA: Diagnosis not present

## 2023-01-28 DIAGNOSIS — S72001A Fracture of unspecified part of neck of right femur, initial encounter for closed fracture: Secondary | ICD-10-CM

## 2023-01-28 DIAGNOSIS — M810 Age-related osteoporosis without current pathological fracture: Secondary | ICD-10-CM | POA: Diagnosis present

## 2023-01-28 DIAGNOSIS — I255 Ischemic cardiomyopathy: Secondary | ICD-10-CM | POA: Diagnosis not present

## 2023-01-28 DIAGNOSIS — R319 Hematuria, unspecified: Secondary | ICD-10-CM

## 2023-01-28 DIAGNOSIS — M25551 Pain in right hip: Secondary | ICD-10-CM | POA: Diagnosis not present

## 2023-01-28 DIAGNOSIS — Z83719 Family history of colon polyps, unspecified: Secondary | ICD-10-CM

## 2023-01-28 DIAGNOSIS — Z8701 Personal history of pneumonia (recurrent): Secondary | ICD-10-CM

## 2023-01-28 DIAGNOSIS — I252 Old myocardial infarction: Secondary | ICD-10-CM

## 2023-01-28 DIAGNOSIS — Z8249 Family history of ischemic heart disease and other diseases of the circulatory system: Secondary | ICD-10-CM

## 2023-01-28 DIAGNOSIS — M26609 Unspecified temporomandibular joint disorder, unspecified side: Secondary | ICD-10-CM | POA: Diagnosis present

## 2023-01-28 HISTORY — PX: HIP PINNING,CANNULATED: SHX1758

## 2023-01-28 LAB — URINALYSIS, W/ REFLEX TO CULTURE (INFECTION SUSPECTED)
Bilirubin Urine: NEGATIVE
Glucose, UA: NEGATIVE mg/dL
Ketones, ur: NEGATIVE mg/dL
Nitrite: POSITIVE — AB
Protein, ur: 30 mg/dL — AB
Specific Gravity, Urine: 1.02 (ref 1.005–1.030)
pH: 5.5 (ref 5.0–8.0)

## 2023-01-28 LAB — COMPREHENSIVE METABOLIC PANEL
ALT: 31 U/L (ref 0–44)
AST: 44 U/L — ABNORMAL HIGH (ref 15–41)
Albumin: 3.4 g/dL — ABNORMAL LOW (ref 3.5–5.0)
Alkaline Phosphatase: 82 U/L (ref 38–126)
Anion gap: 12 (ref 5–15)
BUN: 29 mg/dL — ABNORMAL HIGH (ref 8–23)
CO2: 21 mmol/L — ABNORMAL LOW (ref 22–32)
Calcium: 8.4 mg/dL — ABNORMAL LOW (ref 8.9–10.3)
Chloride: 94 mmol/L — ABNORMAL LOW (ref 98–111)
Creatinine, Ser: 1.58 mg/dL — ABNORMAL HIGH (ref 0.44–1.00)
GFR, Estimated: 35 mL/min — ABNORMAL LOW (ref >60)
Glucose, Bld: 140 mg/dL — ABNORMAL HIGH (ref 70–99)
Potassium: 3.3 mmol/L — ABNORMAL LOW (ref 3.5–5.1)
Sodium: 127 mmol/L — ABNORMAL LOW (ref 135–145)
Total Bilirubin: 1.4 mg/dL — ABNORMAL HIGH (ref 0.3–1.2)
Total Protein: 6 g/dL — ABNORMAL LOW (ref 6.5–8.1)

## 2023-01-28 LAB — POCT I-STAT EG7
Acid-base deficit: 1 mmol/L (ref 0.0–2.0)
Bicarbonate: 22 mmol/L (ref 20.0–28.0)
Calcium, Ion: 1.12 mmol/L — ABNORMAL LOW (ref 1.15–1.40)
HCT: 27 % — ABNORMAL LOW (ref 36.0–46.0)
Hemoglobin: 9.2 g/dL — ABNORMAL LOW (ref 12.0–15.0)
O2 Saturation: 91 %
Potassium: 3.7 mmol/L (ref 3.5–5.1)
Sodium: 129 mmol/L — ABNORMAL LOW (ref 135–145)
TCO2: 23 mmol/L (ref 22–32)
pCO2, Ven: 30.1 mm[Hg] — ABNORMAL LOW (ref 44–60)
pH, Ven: 7.473 — ABNORMAL HIGH (ref 7.25–7.43)
pO2, Ven: 55 mm[Hg] — ABNORMAL HIGH (ref 32–45)

## 2023-01-28 LAB — APTT: aPTT: 31 s (ref 24–36)

## 2023-01-28 LAB — PROTIME-INR
INR: 1.1 (ref 0.8–1.2)
Prothrombin Time: 14.9 s (ref 11.4–15.2)

## 2023-01-28 LAB — CBC WITH DIFFERENTIAL/PLATELET
Abs Immature Granulocytes: 0.11 10*3/uL — ABNORMAL HIGH (ref 0.00–0.07)
Basophils Absolute: 0 10*3/uL (ref 0.0–0.1)
Basophils Relative: 0 %
Eosinophils Absolute: 0 10*3/uL (ref 0.0–0.5)
Eosinophils Relative: 0 %
HCT: 29.8 % — ABNORMAL LOW (ref 36.0–46.0)
Hemoglobin: 9.9 g/dL — ABNORMAL LOW (ref 12.0–15.0)
Immature Granulocytes: 1 %
Lymphocytes Relative: 3 %
Lymphs Abs: 0.4 10*3/uL — ABNORMAL LOW (ref 0.7–4.0)
MCH: 29.6 pg (ref 26.0–34.0)
MCHC: 33.2 g/dL (ref 30.0–36.0)
MCV: 89 fL (ref 80.0–100.0)
Monocytes Absolute: 1.1 10*3/uL — ABNORMAL HIGH (ref 0.1–1.0)
Monocytes Relative: 9 %
Neutro Abs: 10.9 10*3/uL — ABNORMAL HIGH (ref 1.7–7.7)
Neutrophils Relative %: 87 %
Platelets: 200 10*3/uL (ref 150–400)
RBC: 3.35 MIL/uL — ABNORMAL LOW (ref 3.87–5.11)
RDW: 12.7 % (ref 11.5–15.5)
WBC: 12.5 10*3/uL — ABNORMAL HIGH (ref 4.0–10.5)
nRBC: 0 % (ref 0.0–0.2)

## 2023-01-28 LAB — I-STAT CG4 LACTIC ACID, ED
Lactic Acid, Venous: 2.2 mmol/L (ref 0.5–1.9)
Lactic Acid, Venous: 2.1 mmol/L (ref 0.5–1.9)

## 2023-01-28 LAB — RESP PANEL BY RT-PCR (RSV, FLU A&B, COVID)  RVPGX2
Influenza A by PCR: NEGATIVE
Influenza B by PCR: NEGATIVE
Resp Syncytial Virus by PCR: NEGATIVE
SARS Coronavirus 2 by RT PCR: NEGATIVE

## 2023-01-28 LAB — CK: Total CK: 1118 U/L — ABNORMAL HIGH (ref 38–234)

## 2023-01-28 SURGERY — FIXATION, FEMUR, NECK, PERCUTANEOUS, USING SCREW
Anesthesia: General | Site: Hip | Laterality: Right

## 2023-01-28 MED ORDER — LIDOCAINE 2% (20 MG/ML) 5 ML SYRINGE
INTRAMUSCULAR | Status: AC
  Filled 2023-01-28: qty 15

## 2023-01-28 MED ORDER — EPHEDRINE 5 MG/ML INJ
INTRAVENOUS | Status: AC
  Filled 2023-01-28: qty 5

## 2023-01-28 MED ORDER — HYDROCODONE-ACETAMINOPHEN 7.5-325 MG PO TABS: 1.0000 | ORAL_TABLET | ORAL | 0 refills | Status: DC | PRN

## 2023-01-28 MED ORDER — 0.9 % SODIUM CHLORIDE (POUR BTL) OPTIME
TOPICAL | Status: DC | PRN
  Administered 2023-01-28: 100 mL

## 2023-01-28 MED ORDER — AMISULPRIDE (ANTIEMETIC) 5 MG/2ML IV SOLN: 10.0000 mg | Freq: Once | INTRAVENOUS | Status: DC | PRN

## 2023-01-28 MED ORDER — ONDANSETRON HCL 4 MG PO TABS: 4.0000 mg | ORAL_TABLET | Freq: Three times a day (TID) | ORAL | 0 refills | Status: DC | PRN

## 2023-01-28 MED ORDER — PHENYLEPHRINE HCL-NACL 20-0.9 MG/250ML-% IV SOLN
INTRAVENOUS | Status: DC | PRN
  Administered 2023-01-28: 40 ug/min via INTRAVENOUS

## 2023-01-28 MED ORDER — PROPOFOL 10 MG/ML IV BOLUS
INTRAVENOUS | Status: AC
Start: 2023-01-28 — End: ?
  Filled 2023-01-28: qty 20

## 2023-01-28 MED ORDER — DIPHENHYDRAMINE HCL 50 MG/ML IJ SOLN
INTRAMUSCULAR | Status: AC
  Filled 2023-01-28: qty 1

## 2023-01-28 MED ORDER — METOCLOPRAMIDE HCL 5 MG PO TABS: 5.0000 mg | ORAL_TABLET | Freq: Three times a day (TID) | ORAL | Status: DC | PRN

## 2023-01-28 MED ORDER — ONDANSETRON HCL 4 MG/2ML IJ SOLN: 4.0000 mg | Freq: Four times a day (QID) | INTRAMUSCULAR | Status: DC | PRN

## 2023-01-28 MED ORDER — MIDAZOLAM HCL 2 MG/2ML IJ SOLN
INTRAMUSCULAR | Status: AC
  Filled 2023-01-28: qty 2

## 2023-01-28 MED ORDER — HYDROMORPHONE HCL 1 MG/ML IJ SOLN
0.5000 mg | INTRAMUSCULAR | Status: DC | PRN
  Administered 2023-01-28 – 2023-02-01 (×4): 0.5 mg via INTRAVENOUS
  Filled 2023-01-28 (×3): qty 0.5
  Filled 2023-01-28: qty 1

## 2023-01-28 MED ORDER — DEXAMETHASONE SODIUM PHOSPHATE 10 MG/ML IJ SOLN
INTRAMUSCULAR | Status: AC
  Filled 2023-01-28: qty 3

## 2023-01-28 MED ORDER — METOCLOPRAMIDE HCL 5 MG/ML IJ SOLN: 5.0000 mg | Freq: Three times a day (TID) | INTRAMUSCULAR | Status: DC | PRN

## 2023-01-28 MED ORDER — PROPOFOL 10 MG/ML IV BOLUS
INTRAVENOUS | Status: DC | PRN
  Administered 2023-01-28: 120 mg via INTRAVENOUS

## 2023-01-28 MED ORDER — ARTIFICIAL TEARS OPHTHALMIC OINT
TOPICAL_OINTMENT | OPHTHALMIC | Status: AC
  Filled 2023-01-28: qty 3.5

## 2023-01-28 MED ORDER — ASPIRIN 325 MG PO TBEC
325.0000 mg | DELAYED_RELEASE_TABLET | Freq: Every day | ORAL | Status: DC
Start: 2023-01-29 — End: 2023-02-01
  Administered 2023-01-29 – 2023-02-01 (×4): 325 mg via ORAL
  Filled 2023-01-28 (×4): qty 1

## 2023-01-28 MED ORDER — PHENOL 1.4 % MT LIQD: 1.0000 | OROMUCOSAL | Status: DC | PRN

## 2023-01-28 MED ORDER — LAMOTRIGINE 100 MG PO TABS
300.0000 mg | ORAL_TABLET | Freq: Every day | ORAL | Status: DC
  Administered 2023-01-29 – 2023-01-31 (×3): 300 mg via ORAL
  Filled 2023-01-28: qty 2
  Filled 2023-01-28 (×3): qty 3

## 2023-01-28 MED ORDER — FENTANYL CITRATE (PF) 250 MCG/5ML IJ SOLN
INTRAMUSCULAR | Status: AC
  Filled 2023-01-28: qty 5

## 2023-01-28 MED ORDER — ROCURONIUM BROMIDE 10 MG/ML (PF) SYRINGE
PREFILLED_SYRINGE | INTRAVENOUS | Status: DC | PRN
  Administered 2023-01-28: 50 mg via INTRAVENOUS

## 2023-01-28 MED ORDER — SODIUM CHLORIDE 0.9 % IV SOLN: INTRAVENOUS | Status: AC

## 2023-01-28 MED ORDER — ROCURONIUM BROMIDE 10 MG/ML (PF) SYRINGE
PREFILLED_SYRINGE | INTRAVENOUS | Status: AC
  Filled 2023-01-28: qty 10

## 2023-01-28 MED ORDER — BUPIVACAINE HCL 0.25 % IJ SOLN
INTRAMUSCULAR | Status: DC | PRN
Start: 2023-01-28 — End: 2023-01-28
  Administered 2023-01-28: 30 mL

## 2023-01-28 MED ORDER — ALBUTEROL SULFATE (2.5 MG/3ML) 0.083% IN NEBU
3.0000 mL | INHALATION_SOLUTION | Freq: Four times a day (QID) | RESPIRATORY_TRACT | Status: DC | PRN
  Administered 2023-01-30: 3 mL via RESPIRATORY_TRACT
  Filled 2023-01-28: qty 3

## 2023-01-28 MED ORDER — HYDROCODONE-ACETAMINOPHEN 5-325 MG PO TABS
1.0000 | ORAL_TABLET | Freq: Four times a day (QID) | ORAL | Status: DC | PRN
  Administered 2023-01-29: 1 via ORAL
  Administered 2023-01-29 – 2023-02-01 (×7): 2 via ORAL
  Filled 2023-01-28 (×8): qty 2

## 2023-01-28 MED ORDER — MENTHOL 3 MG MT LOZG: 1.0000 | LOZENGE | OROMUCOSAL | Status: DC | PRN

## 2023-01-28 MED ORDER — PHENYLEPHRINE 80 MCG/ML (10ML) SYRINGE FOR IV PUSH (FOR BLOOD PRESSURE SUPPORT)
PREFILLED_SYRINGE | INTRAVENOUS | Status: AC
  Filled 2023-01-28: qty 30

## 2023-01-28 MED ORDER — SODIUM CHLORIDE 0.9 % IV SOLN
1.0000 g | Freq: Once | INTRAVENOUS | Status: AC
  Administered 2023-01-28: 1 g via INTRAVENOUS
  Filled 2023-01-28: qty 5

## 2023-01-28 MED ORDER — HYDROMORPHONE HCL 1 MG/ML IJ SOLN: 0.2500 mg | INTRAMUSCULAR | Status: DC | PRN

## 2023-01-28 MED ORDER — ASPIRIN 81 MG PO TBEC: 81.0000 mg | DELAYED_RELEASE_TABLET | Freq: Every day | ORAL | Status: DC

## 2023-01-28 MED ORDER — ARFORMOTEROL TARTRATE 15 MCG/2ML IN NEBU
15.0000 ug | INHALATION_SOLUTION | Freq: Two times a day (BID) | RESPIRATORY_TRACT | Status: DC
  Administered 2023-01-29 – 2023-02-01 (×6): 15 ug via RESPIRATORY_TRACT
  Filled 2023-01-28 (×7): qty 2

## 2023-01-28 MED ORDER — ONDANSETRON HCL 4 MG PO TABS: 4.0000 mg | ORAL_TABLET | Freq: Four times a day (QID) | ORAL | Status: DC | PRN

## 2023-01-28 MED ORDER — TRANEXAMIC ACID-NACL 1000-0.7 MG/100ML-% IV SOLN
1000.0000 mg | Freq: Once | INTRAVENOUS | Status: AC
  Administered 2023-01-29: 1000 mg via INTRAVENOUS
  Filled 2023-01-28: qty 100

## 2023-01-28 MED ORDER — LACTATED RINGERS IV BOLUS (SEPSIS)
1000.0000 mL | Freq: Once | INTRAVENOUS | Status: AC
Start: 2023-01-28 — End: 2023-01-28
  Administered 2023-01-28: 1000 mL via INTRAVENOUS

## 2023-01-28 MED ORDER — ZOLPIDEM TARTRATE 5 MG PO TABS
5.0000 mg | ORAL_TABLET | Freq: Every evening | ORAL | Status: DC | PRN
  Administered 2023-01-30 – 2023-01-31 (×3): 5 mg via ORAL
  Filled 2023-01-28 (×4): qty 1

## 2023-01-28 MED ORDER — DOCUSATE SODIUM 100 MG PO CAPS
100.0000 mg | ORAL_CAPSULE | Freq: Two times a day (BID) | ORAL | Status: DC
Start: 2023-01-28 — End: 2023-02-01
  Administered 2023-01-28 – 2023-02-01 (×6): 100 mg via ORAL
  Filled 2023-01-28 (×8): qty 1

## 2023-01-28 MED ORDER — SODIUM CHLORIDE 0.9 % IV SOLN
1.0000 g | Freq: Three times a day (TID) | INTRAVENOUS | Status: DC
  Filled 2023-01-28 (×8): qty 5

## 2023-01-28 MED ORDER — BUPIVACAINE HCL (PF) 0.25 % IJ SOLN
INTRAMUSCULAR | Status: AC
  Filled 2023-01-28: qty 30

## 2023-01-28 MED ORDER — DIPHENHYDRAMINE HCL 50 MG/ML IJ SOLN
INTRAMUSCULAR | Status: DC | PRN
  Administered 2023-01-28: 12.5 mg via INTRAVENOUS

## 2023-01-28 MED ORDER — POTASSIUM CHLORIDE 10 MEQ/100ML IV SOLN
10.0000 meq | INTRAVENOUS | Status: AC
  Administered 2023-01-28 (×2): 10 meq via INTRAVENOUS
  Filled 2023-01-28 (×2): qty 100

## 2023-01-28 MED ORDER — SUGAMMADEX SODIUM 200 MG/2ML IV SOLN
INTRAVENOUS | Status: DC | PRN
  Administered 2023-01-28: 160 mg via INTRAVENOUS

## 2023-01-28 MED ORDER — ACETAMINOPHEN 325 MG PO TABS
650.0000 mg | ORAL_TABLET | Freq: Four times a day (QID) | ORAL | Status: DC | PRN
  Administered 2023-01-28 – 2023-01-30 (×4): 650 mg via ORAL
  Filled 2023-01-28 (×4): qty 2

## 2023-01-28 MED ORDER — LACTATED RINGERS IV BOLUS (SEPSIS): 1000.0000 mL | Freq: Once | INTRAVENOUS | Status: DC

## 2023-01-28 MED ORDER — ONDANSETRON HCL 4 MG/2ML IJ SOLN
INTRAMUSCULAR | Status: AC
  Filled 2023-01-28: qty 6

## 2023-01-28 MED ORDER — ALBUMIN HUMAN 5 % IV SOLN: INTRAVENOUS | Status: DC | PRN

## 2023-01-28 MED ORDER — MIDAZOLAM HCL 2 MG/2ML IJ SOLN
INTRAMUSCULAR | Status: DC | PRN
  Administered 2023-01-28 (×2): 1 mg via INTRAVENOUS

## 2023-01-28 MED ORDER — POLYETHYLENE GLYCOL 3350 17 G PO PACK: 17.0000 g | PACK | Freq: Every day | ORAL | Status: DC | PRN

## 2023-01-28 MED ORDER — BUPROPION HCL ER (XL) 150 MG PO TB24
300.0000 mg | ORAL_TABLET | Freq: Every day | ORAL | Status: DC
  Administered 2023-01-29 – 2023-01-30 (×2): 300 mg via ORAL
  Filled 2023-01-28 (×2): qty 2

## 2023-01-28 MED ORDER — METOPROLOL SUCCINATE ER 100 MG PO TB24
100.0000 mg | ORAL_TABLET | Freq: Every day | ORAL | Status: DC
  Administered 2023-01-29 – 2023-02-01 (×4): 100 mg via ORAL
  Filled 2023-01-28 (×4): qty 1

## 2023-01-28 MED ORDER — PANTOPRAZOLE SODIUM 40 MG PO TBEC
40.0000 mg | DELAYED_RELEASE_TABLET | Freq: Every day | ORAL | Status: DC
  Administered 2023-01-29 – 2023-02-01 (×4): 40 mg via ORAL
  Filled 2023-01-28 (×4): qty 1

## 2023-01-28 MED ORDER — PHENYLEPHRINE 80 MCG/ML (10ML) SYRINGE FOR IV PUSH (FOR BLOOD PRESSURE SUPPORT)
PREFILLED_SYRINGE | INTRAVENOUS | Status: DC | PRN
  Administered 2023-01-28: 160 ug via INTRAVENOUS
  Administered 2023-01-28: 80 ug via INTRAVENOUS

## 2023-01-28 MED ORDER — CEFAZOLIN SODIUM-DEXTROSE 2-3 GM-%(50ML) IV SOLR
INTRAVENOUS | Status: DC | PRN
Start: 2023-01-28 — End: 2023-01-28
  Administered 2023-01-28: 2 g via INTRAVENOUS

## 2023-01-28 MED ORDER — CITALOPRAM HYDROBROMIDE 20 MG PO TABS
20.0000 mg | ORAL_TABLET | Freq: Every day | ORAL | Status: DC
  Administered 2023-01-29 – 2023-02-01 (×4): 20 mg via ORAL
  Filled 2023-01-28 (×4): qty 1

## 2023-01-28 MED ORDER — ONDANSETRON HCL 4 MG/2ML IJ SOLN
INTRAMUSCULAR | Status: DC | PRN
  Administered 2023-01-28: 4 mg via INTRAVENOUS

## 2023-01-28 MED ORDER — DEXAMETHASONE SODIUM PHOSPHATE 10 MG/ML IJ SOLN
INTRAMUSCULAR | Status: DC | PRN
  Administered 2023-01-28: 5 mg via INTRAVENOUS

## 2023-01-28 MED ORDER — ACETAMINOPHEN 500 MG PO TABS: 500.0000 mg | ORAL_TABLET | Freq: Once | ORAL | Status: DC

## 2023-01-28 MED ORDER — FENTANYL CITRATE (PF) 250 MCG/5ML IJ SOLN
INTRAMUSCULAR | Status: DC | PRN
  Administered 2023-01-28: 100 ug via INTRAVENOUS

## 2023-01-28 MED ORDER — UMECLIDINIUM BROMIDE 62.5 MCG/ACT IN AEPB
1.0000 | INHALATION_SPRAY | Freq: Every day | RESPIRATORY_TRACT | Status: DC
  Administered 2023-01-30 – 2023-01-31 (×2): 1 via RESPIRATORY_TRACT
  Filled 2023-01-28: qty 7

## 2023-01-28 MED ORDER — MONTELUKAST SODIUM 10 MG PO TABS
10.0000 mg | ORAL_TABLET | Freq: Every evening | ORAL | Status: DC
  Administered 2023-01-28 – 2023-01-31 (×4): 10 mg via ORAL
  Filled 2023-01-28 (×4): qty 1

## 2023-01-28 MED ORDER — ALPRAZOLAM 0.5 MG PO TABS
0.5000 mg | ORAL_TABLET | Freq: Three times a day (TID) | ORAL | Status: DC | PRN
  Administered 2023-01-29 – 2023-01-31 (×3): 0.5 mg via ORAL
  Filled 2023-01-28 (×3): qty 1

## 2023-01-28 MED ORDER — LIDOCAINE 2% (20 MG/ML) 5 ML SYRINGE
INTRAMUSCULAR | Status: DC | PRN
  Administered 2023-01-28: 80 mg via INTRAVENOUS

## 2023-01-28 SURGICAL SUPPLY — 37 items
BIT DRILL CANN LRG QC 5X300 (BIT) IMPLANT
BNDG COHESIVE 4X5 TAN STRL (GAUZE/BANDAGES/DRESSINGS) ×1 IMPLANT
COVER PERINEAL POST (MISCELLANEOUS) ×1 IMPLANT
DRAPE C-ARM 35X43 STRL (DRAPES) ×1 IMPLANT
DRAPE STERI IOBAN 125X83 (DRAPES) ×1 IMPLANT
DRSG ADAPTIC 3X8 NADH LF (GAUZE/BANDAGES/DRESSINGS) ×1 IMPLANT
DURAPREP 26ML APPLICATOR (WOUND CARE) ×1 IMPLANT
ELECT REM PT RETURN 9FT ADLT (ELECTROSURGICAL) ×1
ELECTRODE REM PT RTRN 9FT ADLT (ELECTROSURGICAL) ×1 IMPLANT
GAUZE SPONGE 4X4 12PLY STRL (GAUZE/BANDAGES/DRESSINGS) ×1 IMPLANT
GAUZE SPONGE 4X4 12PLY STRL LF (GAUZE/BANDAGES/DRESSINGS) IMPLANT
GLOVE BIO SURGEON STRL SZ7.5 (GLOVE) ×2 IMPLANT
GLOVE BIOGEL PI IND STRL 7.5 (GLOVE) IMPLANT
GLOVE BIOGEL PI IND STRL 8 (GLOVE) ×2 IMPLANT
GLOVE INDICATOR 8.0 STRL GRN (GLOVE) ×2 IMPLANT
GOWN STRL REUS W/ TWL LRG LVL3 (GOWN DISPOSABLE) ×2 IMPLANT
GOWN STRL REUS W/TWL LRG LVL3 (GOWN DISPOSABLE) ×1
GOWN STRL REUS W/TWL XL LVL3 (GOWN DISPOSABLE) ×1 IMPLANT
GUIDEWIRE THREADED 2.8 (WIRE) IMPLANT
KIT BASIN OR (CUSTOM PROCEDURE TRAY) ×1 IMPLANT
KIT TURNOVER KIT B (KITS) ×1 IMPLANT
NDL HYPO 22X1.5 SAFETY MO (MISCELLANEOUS) IMPLANT
NEEDLE HYPO 22X1.5 SAFETY MO (MISCELLANEOUS) ×1 IMPLANT
NS IRRIG 1000ML POUR BTL (IV SOLUTION) ×1 IMPLANT
PACK GENERAL/GYN (CUSTOM PROCEDURE TRAY) ×1 IMPLANT
PAD ARMBOARD 7.5X6 YLW CONV (MISCELLANEOUS) ×2 IMPLANT
SCREW CANN 16 THRD/85 7.3 (Screw) IMPLANT
SCREW CANN 16 THRD/90 7.3 (Screw) IMPLANT
STAPLER VISISTAT 35W (STAPLE) IMPLANT
SUT VIC AB 0 CT1 27 (SUTURE) ×1
SUT VIC AB 0 CT1 27XBRD ANBCTR (SUTURE) ×1 IMPLANT
SUT VIC AB 2-0 CT1 27 (SUTURE) ×1
SUT VIC AB 2-0 CT1 TAPERPNT 27 (SUTURE) ×1 IMPLANT
SYR CONTROL 10ML LL (SYRINGE) IMPLANT
TOWEL GREEN STERILE (TOWEL DISPOSABLE) ×1 IMPLANT
TOWEL GREEN STERILE FF (TOWEL DISPOSABLE) ×1 IMPLANT
WASHER FOR 5.0 SCREWS (Washer) IMPLANT

## 2023-01-28 NOTE — ED Provider Notes (Signed)
[place] EMERGENCY DEPARTMENT AT [facility] Provider Note   CSN: [id number] Arrival date & time: 01/28/23  1034     History  Chief Complaint  Patient presents with   Fall   Shortness of Breath    Erin Lopez is a 72 y.o. female.   Fall Associated symptoms include shortness of breath.  Shortness of Breath    PT states she was walking last night at home and then started to feel like she was bouncing off the walls.  She was off balance and fell.  SHe was unable to get up off the ground.  Pt states she can't lift her leg.  It is now painful in her right leg.  She is not sure if it is weak as well.  No trouble with speech.  NO new trouble with numbness but she has had some chronic issue with the nerve in her face.  Pt also with recent pna diagnosis.  Still feels like her breathing has not completely resolved.  SHe was chilled and felt feverish last few nights.  No alcohol, no smoking.  Home Medications Prior to Admission medications   Medication Sig Start Date End Date Taking? Authorizing Provider  acetaminophen (TYLENOL) 500 MG tablet Take 500-1,000 mg by mouth daily as needed for mild pain or fever.    [provider]  albuterol (VENTOLIN HFA) 108 (90 Base) MCG/ACT inhaler Inhale 2 puffs into the lungs every 6 (six) hours as needed for wheezing or shortness of breath. 01/19/23   McGowen, Maryjean Morn, MD  ALPRAZolam Prudy Feeler) 0.5 MG tablet TAKE 1 TABLET BY MOUTH THREE TIMES A DAY AS NEEDED FOR ANXIETY 10/25/22   McGowen, Maryjean Morn, MD  Ascorbic Acid (VITAMIN C PO) Take 1 tablet by mouth daily.    [provider]  aspirin EC 81 MG tablet Take 81 mg by mouth daily. Swallow whole.    [provider]  atorvastatin (LIPITOR) 80 MG tablet TAKE 1 TABLET BY MOUTH DAILY. 10/12/22   McGowen, Maryjean Morn, MD  b complex vitamins capsule Take 1 capsule by mouth 2 (two) times daily.    [provider]  buPROPion (WELLBUTRIN XL) 300 MG 24 hr tablet  Take 1 tablet (300 mg total) by mouth daily. 12/15/22   McGowen, Maryjean Morn, MD  Cholecalciferol (VITAMIN D-3 PO) Take 1 capsule by mouth daily.    [provider]  citalopram (CELEXA) 20 MG tablet Take 1 tablet (20 mg total) by mouth in the morning and at bedtime. 12/15/22   McGowen, Maryjean Morn, MD  famotidine (PEPCID) 40 MG tablet Take 1 tablet (40 mg total) by mouth daily. 10/12/22   McGowen, Maryjean Morn, MD  fluticasone (FLONASE) 50 MCG/ACT nasal spray SPRAY 2 SPRAYS INTO EACH NOSTRIL EVERY DAY 10/20/22   McGowen, Maryjean Morn, MD  hydrochlorothiazide (HYDRODIURIL) 12.5 MG tablet TAKE 1 TABLET BY MOUTH EVERY DAY 12/06/22   Quintella Reichert, MD  KLOR-CON M20 20 MEQ tablet TAKE 1 TABLET BY MOUTH EVERY DAY 01/23/23   McGowen, Maryjean Morn, MD  lamoTRIgine (LAMICTAL) 100 MG tablet TAKE 3 TABLETS (300 MG TOTAL) BY MOUTH AT BEDTIME 10/20/22   McGowen, Maryjean Morn, MD  losartan (COZAAR) 25 MG tablet TAKE 1 TABLET BY MOUTH EVERY DAY 12/23/22   Quintella Reichert, MD  MAGNESIUM OXIDE PO Take 1 tablet by mouth daily.    [provider]  meloxicam (MOBIC) 15 MG tablet TAKE 1 TABLET BY MOUTH ONCE DAILY AS NEEDED FOR JOINT  PAIN 01/23/23   McGowen, Maryjean Morn, MD  methocarbamol (ROBAXIN) 500 MG tablet Take 1 tablet (500 mg total) by mouth every 6 (six) hours as needed for muscle spasms. 01/20/22   Edmisten, Kristie L, PA  metoprolol succinate (TOPROL-XL) 100 MG 24 hr tablet TAKE 1 TABLET BY MOUTH EVERY DAY WITH OR IMMEDIATELY FOLLOWING A MEAL 06/16/22   Turner, Cornelious Bryant, MD  montelukast (SINGULAIR) 10 MG tablet TAKE 1 TABLET BY MOUTH EVERY DAY IN THE EVENING 11/09/22   McGowen, Maryjean Morn, MD  Multiple Vitamins-Minerals (ZINC PO) Take 1 tablet by mouth daily.    [provider]  nitroGLYCERIN (NITROSTAT) 0.4 MG SL tablet Place 1 tablet (0.4 mg total) under the tongue every 5 (five) minutes x 3 doses as needed for chest pain. 11/01/21   McGowen, Maryjean Morn, MD  oxyCODONE (OXY IR/ROXICODONE) 5 MG immediate release tablet 1  tab po tid prn severe pain 03/30/22   McGowen, Maryjean Morn, MD  pantoprazole (PROTONIX) 40 MG tablet TAKE 1 TABLET (40 MG TOTAL) BY MOUTH DAILY. KEEP F/U APPT FOR ANY FURTHER REFILLS 11/15/22   Pyrtle, Carie Caddy, MD  Tiotropium Bromide-Olodaterol (STIOLTO RESPIMAT) 2.5-2.5 MCG/ACT AERS INHALE 2 EACH INTO THE LUNGS DAILY. 03/14/22   Mannam, Colbert Coyer, MD  zolpidem (AMBIEN) 10 MG tablet TAKE 1 TABLET BY MOUTH EVERYDAY AT BEDTIME 10/25/22   McGowen, Maryjean Morn, MD      Allergies    Ceftin [cefuroxime axetil], Cipro [ciprofloxacin hcl], Risperdal [risperidone], Fosamax [alendronate sodium], Morphine and codeine, Prednisone, Roxicodone [oxycodone], and Seroquel [quetiapine]    Review of Systems   Review of Systems  Respiratory:  Positive for shortness of breath.     Physical Exam Updated Vital Signs BP (!) 120/55   Pulse (!) 110   Temp 98.6 F (37 C) (Oral)   Resp (!) 24   Ht 1.702 m (5\' 7" )   Wt 78.5 kg   SpO2 98%   BMI 27.10 kg/m  Physical Exam Vitals and nursing note reviewed.  Constitutional:      Appearance: She is well-developed. She is diaphoretic.  HENT:     Head: Normocephalic and atraumatic.     Right Ear: External ear normal.     Left Ear: External ear normal.     Mouth/Throat:     Mouth: Mucous membranes are dry.  Eyes:     General: No scleral icterus.       Right eye: No discharge.        Left eye: No discharge.     Conjunctiva/sclera: Conjunctivae normal.  Neck:     Trachea: No tracheal deviation.  Cardiovascular:     Rate and Rhythm: Normal rate and regular rhythm.  Pulmonary:     Effort: Pulmonary effort is normal. No respiratory distress.     Breath sounds: Normal breath sounds. No stridor. No wheezing or rales.  Abdominal:     General: Bowel sounds are normal. There is no distension.     Palpations: Abdomen is soft.     Tenderness: There is no abdominal tenderness. There is no guarding or rebound.  Musculoskeletal:        General: No tenderness or deformity.      Cervical back: Normal and neck supple.     Thoracic back: Normal.     Lumbar back: Normal.     Right hip: Bony tenderness present. Decreased range of motion.  Skin:    General: Skin is warm.     Findings: No rash.  Neurological:  General: No focal deficit present.     Mental Status: She is alert.     Cranial Nerves: No cranial nerve deficit, dysarthria or facial asymmetry.     Sensory: No sensory deficit.     Motor: No abnormal muscle tone or seizure activity.     Coordination: Coordination normal.  Psychiatric:        Mood and Affect: Mood normal.     ED Results / Procedures / Treatments   Labs (all labs ordered are listed, but only abnormal results are displayed) Labs Reviewed  COMPREHENSIVE METABOLIC PANEL - Abnormal; Notable for the following components:      Result Value   Sodium 127 (*)    Potassium 3.3 (*)    Chloride 94 (*)    CO2 21 (*)    Glucose, Bld 140 (*)    BUN 29 (*)    Creatinine, Ser 1.58 (*)    Calcium 8.4 (*)    Total Protein 6.0 (*)    Albumin 3.4 (*)    AST 44 (*)    Total Bilirubin 1.4 (*)    GFR, Estimated 35 (*)    All other components within normal limits  CBC WITH DIFFERENTIAL/PLATELET - Abnormal; Notable for the following components:   WBC 12.5 (*)    RBC 3.35 (*)    Hemoglobin 9.9 (*)    HCT 29.8 (*)    Neutro Abs 10.9 (*)    Lymphs Abs 0.4 (*)    Monocytes Absolute 1.1 (*)    Abs Immature Granulocytes 0.11 (*)    All other components within normal limits  CK - Abnormal; Notable for the following components:   Total CK 1,118 (*)    All other components within normal limits  URINALYSIS, W/ REFLEX TO CULTURE (INFECTION SUSPECTED) - Abnormal; Notable for the following components:   Hgb urine dipstick MODERATE (*)    Protein, ur 30 (*)    Nitrite POSITIVE (*)    Leukocytes,Ua MODERATE (*)    Bacteria, UA FEW (*)    All other components within normal limits  I-STAT CG4 LACTIC ACID, ED - Abnormal; Notable for the following  components:   Lactic Acid, Venous 2.2 (*)    All other components within normal limits  I-STAT CG4 LACTIC ACID, ED - Abnormal; Notable for the following components:   Lactic Acid, Venous 2.1 (*)    All other components within normal limits  RESP PANEL BY RT-PCR (RSV, FLU A&B, COVID)  RVPGX2  CULTURE, BLOOD (ROUTINE X 2)  CULTURE, BLOOD (ROUTINE X 2)  URINE CULTURE  PROTIME-INR  APTT  HIV ANTIBODY (ROUTINE TESTING W REFLEX)  BASIC METABOLIC PANEL  MAGNESIUM    EKG EKG Interpretation Date/Time:  Saturday January 28 2023 11:00:36 EDT Ventricular Rate:  131 PR Interval:  108 QRS Duration:  85 QT Interval:  430 QTC Calculation: 635 R Axis:   43  Text Interpretation: Sinus tachycardia Prolonged QT interval Since last tracing rate faster Confirmed by Linwood Dibbles [phone]) on 01/28/2023 1:27:40 PM  Radiology CT Head Wo Contrast  Result Date: 01/28/2023 CLINICAL DATA:  Head trauma, minor. EXAM: CT HEAD WITHOUT CONTRAST CT CERVICAL SPINE WITHOUT CONTRAST TECHNIQUE: Multidetector CT imaging of the head and cervical spine was performed following the standard protocol without intravenous contrast. Multiplanar CT image reconstructions of the cervical spine were also generated. RADIATION DOSE REDUCTION: This exam was performed according to the departmental dose-optimization program which includes automated exposure control, adjustment of the mA and/or kV according  to patient size and/or use of iterative reconstruction technique. COMPARISON:  09/30/2021 FINDINGS: CT HEAD FINDINGS Brain: No evidence of acute infarction, hemorrhage, hydrocephalus, extra-axial collection or mass lesion/mass effect. Prominence of the sulci and ventricles. There is mild diffuse low-attenuation within the subcortical and periventricular white matter compatible with chronic microvascular disease. Vascular: No hyperdense vessel or unexpected calcification. Skull: Normal. Negative for fracture or focal lesion. Sinuses/Orbits:  None Other: . CT CERVICAL SPINE FINDINGS Alignment: No acute, posttraumatic malalignment.Unchanged chronic anterolisthesis of C2 on C3 which measures 4.8 cm on today's study, image 37/9. Previously this measured the same. Anterolisthesis of C7 on T1 measures 3.8 cm, also unchanged from previous exam Skull base and vertebrae: No acute fracture. No primary bone lesion or focal pathologic process. Soft tissues and spinal canal: No prevertebral fluid or swelling. No visible canal hematoma. Disc levels: Prior C3 through C7 ACDF status post C6 and C7 hardware removal. 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 Prior left laminectomy at C7. Severe left uncovertebral hypertrophy at C2-3. Upper chest: Negative. Other: None IMPRESSION: 1. No acute intracranial abnormalities. 2. Chronic microvascular disease and cerebral atrophy. 3. No evidence for cervical spine fracture or subluxation. 4. Prior C3 through C7 ACDF status post C6 and C7 hardware removal. 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 5. Unchanged chronic anterolisthesis of C2 on C3 and C7 on T1. Electronically Signed   By: Signa Kell M.D.   On: 01/28/2023 13:12   CT Cervical Spine Wo Contrast  Result Date: 01/28/2023 CLINICAL DATA:  Head trauma, minor. EXAM: CT HEAD WITHOUT CONTRAST CT CERVICAL SPINE WITHOUT CONTRAST TECHNIQUE: Multidetector CT imaging of the head and cervical spine was performed following the standard protocol without intravenous contrast. Multiplanar CT image reconstructions of the cervical spine were also generated. RADIATION DOSE REDUCTION: This exam was performed according to the departmental dose-optimization program which includes automated exposure control, adjustment of the mA and/or kV according to patient size and/or use of iterative reconstruction technique. COMPARISON:  09/30/2021 FINDINGS: CT HEAD FINDINGS Brain: No evidence of acute infarction, hemorrhage, hydrocephalus, extra-axial collection or mass lesion/mass effect.  Prominence of the sulci and ventricles. There is mild diffuse low-attenuation within the subcortical and periventricular white matter compatible with chronic microvascular disease. Vascular: No hyperdense vessel or unexpected calcification. Skull: Normal. Negative for fracture or focal lesion. Sinuses/Orbits: None Other: . CT CERVICAL SPINE FINDINGS Alignment: No acute, posttraumatic malalignment.Unchanged chronic anterolisthesis of C2 on C3 which measures 4.8 cm on today's study, image 37/9. Previously this measured the same. Anterolisthesis of C7 on T1 measures 3.8 cm, also unchanged from previous exam Skull base and vertebrae: No acute fracture. No primary bone lesion or focal pathologic process. Soft tissues and spinal canal: No prevertebral fluid or swelling. No visible canal hematoma. Disc levels: Prior C3 through C7 ACDF status post C6 and C7 hardware removal. 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 Prior left laminectomy at C7. Severe left uncovertebral hypertrophy at C2-3. Upper chest: Negative. Other: None IMPRESSION: 1. No acute intracranial abnormalities. 2. Chronic microvascular disease and cerebral atrophy. 3. No evidence for cervical spine fracture or subluxation. 4. Prior C3 through C7 ACDF status post C6 and C7 hardware removal. 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 5. Unchanged chronic anterolisthesis of C2 on C3 and C7 on T1. Electronically Signed   By: Signa Kell M.D.   On: 01/28/2023 13:12   DG Chest Port 1 View  Result Date: 01/28/2023 CLINICAL DATA:  MC-DGQuestionable sepsis - evaluate for abnormality fall. Found on floor.  EXAM: PORTABLE CHEST 1 VIEW COMPARISON:  01/12/2023 FINDINGS: Normal mediastinum and cardiac silhouette. Normal pulmonary vasculature. No evidence of effusion, infiltrate, or pneumothorax. No acute bony abnormality. IMPRESSION: No acute cardiopulmonary process. Electronically Signed   By: Genevive Bi M.D.   On: 01/28/2023 12:20   DG Hip Unilat W or  Wo Pelvis 2-3 Views Right  Result Date: 01/28/2023 CLINICAL DATA:  72 year old female with history of trauma from a fall. Right hip pain. EXAM: DG HIP (WITH OR WITHOUT PELVIS) 2-3V RIGHT COMPARISON:  No priors. FINDINGS: Three views of the bony pelvis in the right hip demonstrate no acute displaced fracture of the bony pelvic ring. There is a minimally displaced mildly impacted transcervical right femoral neck fracture. Femoral head remains located. Visualized portions of the left proximal femur appear intact. There is joint space narrowing, subchondral sclerosis, subchondral cyst formation and osteophyte formation in the hip joints bilaterally, indicative of osteoarthritis. IMPRESSION: 1. Minimally displaced likely mildly impacted transcervical right femoral neck fracture, as above. Electronically Signed   By: Trudie Reed M.D.   On: 01/28/2023 12:20    Procedures .Critical Care  Performed by: Linwood Dibbles, MD Authorized by: Linwood Dibbles, MD   Critical care provider statement:    Critical care time (minutes):  40   Critical care was time spent personally by me on the following activities:  Development of treatment plan with patient or surrogate, discussions with consultants, evaluation of patient's response to treatment, examination of patient, ordering and review of laboratory studies, ordering and review of radiographic studies, ordering and performing treatments and interventions, pulse oximetry, re-evaluation of patient's condition and review of old charts     Medications Ordered in ED Medications  aztreonam (AZACTAM) 1 g in sodium chloride 0.9 % 100 mL IVPB (1 g Intravenous New Bag/Given 01/28/23 1414)  aspirin EC tablet 81 mg (has no administration in time range)  metoprolol succinate (TOPROL-XL) 24 hr tablet 100 mg (has no administration in time range)  buPROPion (WELLBUTRIN XL) 24 hr tablet 300 mg (has no administration in time range)  citalopram (CELEXA) tablet 20 mg (has no  administration in time range)  lamoTRIgine (LAMICTAL) tablet 300 mg (has no administration in time range)  zolpidem (AMBIEN) tablet 5 mg (has no administration in time range)  ALPRAZolam (XANAX) tablet 0.5 mg (has no administration in time range)  pantoprazole (PROTONIX) EC tablet 40 mg (has no administration in time range)  arformoterol (BROVANA) nebulizer solution 15 mcg (has no administration in time range)    And  umeclidinium bromide (INCRUSE ELLIPTA) 62.5 MCG/ACT 1 puff (has no administration in time range)  montelukast (SINGULAIR) tablet 10 mg (has no administration in time range)  albuterol (VENTOLIN HFA) 108 (90 Base) MCG/ACT inhaler 2 puff (has no administration in time range)  HYDROcodone-acetaminophen (NORCO/VICODIN) 5-325 MG per tablet 1-2 tablet (has no administration in time range)  HYDROmorphone (DILAUDID) injection 0.5 mg (has no administration in time range)  polyethylene glycol (MIRALAX / GLYCOLAX) packet 17 g (has no administration in time range)  0.9 %  sodium chloride infusion (has no administration in time range)  potassium chloride 10 mEq in 100 mL IVPB (has no administration in time range)  lactated ringers bolus 1,000 mL (0 mLs Intravenous Stopped 01/28/23 1238)    ED Course/ Medical Decision Making/ A&P Clinical Course as of 01/28/23 1443  Sat Jan 28, 2023  1232 Lactic acid level increased at 2.2.  Urinalysis suggest a urinary tract infection [JK]  1233 CBC shows leukocytosis  and anemia [JK]  1233 Comprehensive metabolic panel(!) Labs notable for AKI hyponatremia as well as slightly elevated bilirubin [JK]  1234 X-ray without acute findings [JK]  1234 Femoral neck fracture noted [JK]  1442 Case discussed with Dr. Aundria Rud orthopedics.  He requests a CT of the hip.  He will see the patient today to discuss further treatment [JK]    Clinical Course User Index [JK] Linwood Dibbles, MD                                 Medical Decision Making Ddx includes, near  syncope related to infection, stroke, anemia, dehydration Injuries concerning for possible hip pelvic fracture  Problems Addressed: Acute cystitis without hematuria: acute illness or injury that poses a threat to life or bodily functions Anemia, unspecified type: chronic illness or injury with exacerbation, progression, or side effects of treatment Dehydration: acute illness or injury that poses a threat to life or bodily functions Hyponatremia: acute illness or injury that poses a threat to life or bodily functions Traumatic rhabdomyolysis, initial encounter [facility]): acute illness or injury that poses a threat to life or bodily functions  Amount and/or Complexity of Data Reviewed Labs: ordered. Decision-making details documented in ED Course. Radiology: ordered. ECG/medicine tests: ordered.  Risk Decision regarding hospitalization.   Pt presents to the ED for hip pain after a fall.  Unable to stand up.  Was lying on the ground overnight.  Sx preceded by feeling weak, off balance.  ED workup notable for dehydration, hyponatremia.  IV fluids ordered.  UA suggestive of infection.  Started on IV aztreonam considering her allergies to cephalosporins and quinolones.  Lactic acid slightly elevated, no hypotension, continue to monitor closely.  NO signs of stroke or severe head injury on CT.  Suspect her infection dehydration is primary cause of weakness, fall.  Xrays demonstrate a hip fx explaining her inability to stand, bear weight.  Case discussed with Dr Aundria Rud, orthopedics and Dr Ernie Avena hospitalist service for admission.        Final Clinical Impression(s) / ED Diagnoses Final diagnoses:  Acute cystitis without hematuria  Hyponatremia  Dehydration  Anemia, unspecified type  Traumatic rhabdomyolysis, initial encounter [facility])    Rx / DC Orders ED Discharge Orders     None         Linwood Dibbles, MD 01/28/23 1827

## 2023-01-28 NOTE — Anesthesia Preprocedure Evaluation (Signed)
Anesthesia Evaluation    Reviewed: Allergy & Precautions, Patient's Chart, lab work & pertinent test results, Unable to perform ROS - Chart review only  Airway Mallampati: II  TM Distance: >3 FB Neck ROM: Full    Dental no notable dental hx.    Pulmonary pneumonia, COPD, former smoker   Pulmonary exam normal        Cardiovascular hypertension, Pt. on medications and Pt. 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 The left ventricular ejection fraction is hyperdynamic (>65%). ? Nuclear stress EF: 66%. ? There was no ST segment deviation noted during stress. ? The study is normal. ? This is a low risk study.  Normal resting and stress perfusion. No ischemia or infarction EF 66%  12/03/2019 1. Left ventricular ejection fraction, by estimation, is 55 to 60%. The  left ventricle has normal function. Left ventricular endocardial border  not optimally defined to evaluate regional wall motion. There is mild left  ventricular hypertrophy. Left  ventricular diastolic parameters are indeterminate.  2. Right ventricular systolic function is normal. The right ventricular  size is normal. There is mildly elevated pulmonary artery systolic  pressure. The estimated right ventricular systolic pressure is 40.0 mmHg.  3. The mitral valve is normal in structure. Trivial mitral valve  regurgitation. No evidence of mitral stenosis.  4. The aortic valve is tricuspid. Aortic valve regurgitation is not  visualized. No aortic stenosis is present.  5. The inferior vena cava is normal in size with greater than 50%  respiratory variability, suggesting right atrial pressure of 3 mmHg.    Neuro/Psych   Anxiety Depression Bipolar Disorder   TIACVA    GI/Hepatic Neg liver ROS, hiatal hernia,GERD  Medicated,,  Endo/Other  negative endocrine ROS    Renal/GU ARFRenal disease      Musculoskeletal  (+) Arthritis , Osteoarthritis,  Fibromyalgia -  Abdominal Normal abdominal exam  (+)   Peds  Hematology  (+) Blood dyscrasia, anemia   Anesthesia Other Findings   Reproductive/Obstetrics                             Anesthesia Physical Anesthesia Plan  ASA: 3  Anesthesia Plan: General   Post-op Pain Management: Ofirmev IV (intra-op)*   Induction: Intravenous  PONV Risk Score and Plan: 2 and Ondansetron, Dexamethasone and Treatment may vary due to age or medical condition  Airway Management Planned: Oral ETT  Additional Equipment: None  Intra-op Plan:   Post-operative Plan: Extubation in OR  Informed Consent:   Plan Discussed with: CRNA  Anesthesia Plan Comments:         Anesthesia Quick Evaluation

## 2023-01-28 NOTE — Brief Op Note (Signed)
01/28/2023  8:56 PM  PATIENT:  Erin Lopez  72 y.o. female  PRE-OPERATIVE DIAGNOSIS:  right hip fracture  POST-OPERATIVE DIAGNOSIS:  right hip fracture  PROCEDURE:  Procedure(s): PERCUTANEOUS FIXATION OF RIGHT FEMORAL NECK (Right)  SURGEON:  Surgeons and Role:    * Yolonda Kida, MD - Primary  PHYSICIAN ASSISTANT: Dion Saucier, PA-C  ANESTHESIA:   local and general  EBL:  10 cc    BLOOD ADMINISTERED:none  DRAINS: none   LOCAL MEDICATIONS USED:  MARCAINE     SPECIMEN:  No Specimen  DISPOSITION OF SPECIMEN:  N/A  COUNTS:  YES  TOURNIQUET:  * No tourniquets in log *  DICTATION: .Note written in EPIC  PLAN OF CARE: Admit to inpatient   PATIENT DISPOSITION:  PACU - hemodynamically stable.   Delay start of Pharmacological VTE agent (>24hrs) due to surgical blood loss or risk of bleeding: not applicable
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
## 2023-01-28 NOTE — ED Notes (Signed)
ED TO INPATIENT HANDOFF REPORT  ED Nurse Name and Phone #: Einar Grad [phone]  S Name/Age/Gender Erin Lopez 72 y.o. female Room/Bed: 030C/030C  Code Status   Code Status: Full Code  Home/SNF/Other Home Patient oriented to: self, place, time, and situation Is this baseline? Yes   Triage Complete: Triage complete  Chief Complaint Fracture of femoral neck, right, closed (HCC) [S72.001A]  Triage Note Patient presents via EMS from home for fall last night at approximately 2200 and was on the floor for approximately 12 hours. Per EMS, patient lives at home alone. Per EMS, patient had sudden-onset of dizziness which caused the fall and patient has been dizzy since then. Per EMS, patient had a head strike on a doorframe; unsure of LOC; patient denies taking blood thinners. Patient endorses right leg pain and neck pain. Per EMS, patient refused c-collar.  Patient reports shortness of breath for three weeks. Patient states that three weeks ago she was diagnosed with pneumonia and prescribed antibiotics for ten days. Patient states that she followed up with her doctor last week and that her doctor stated that she "still had some" but per patient she was not given any additional medication at that time. Per EMS, upon their arrival, patient had audible wheezing and was hypoxic at 89% on room air.    Allergies Allergies  Allergen Reactions   Ceftin [Cefuroxime Axetil] Anaphylaxis and Swelling    Swelling of eyes and tongue   Cipro [Ciprofloxacin Hcl] Anaphylaxis and Swelling   Risperdal [Risperidone] Palpitations    Generalized weakness Malaise  Hypertension   Fosamax [Alendronate Sodium] Diarrhea   Morphine And Codeine Other (See Comments)    Hallucinations Disorientation    Prednisone Other (See Comments)    Hyperactivity   Roxicodone [Oxycodone] Other (See Comments)    Previously addicted to medication   Seroquel [Quetiapine] Other (See Comments)    Oversedation     Level  of Care/Admitting Diagnosis ED Disposition     ED Disposition  Admit   Condition  --   Comment  Hospital Area: [facility] [[id number]]  Level of Care: Telemetry Medical [[id number]]  May admit patient to [facility] or [facility] if equivalent level of care is available:: No  Covid Evaluation: Asymptomatic - no recent exposure (last 10 days) testing not required  Diagnosis: Fracture of femoral neck, right, closed [facility]) [[id number]]  Admitting Physician: Synetta Fail [[id number]]  Attending Physician: Synetta Fail [[id number]]  Certification:: I certify this patient will need inpatient services for at least 2 midnights  Expected Medical Readiness: 01/30/2023          B Medical/Surgery History Past Medical History:  Diagnosis Date   Abnormal chest x-ray 06/30/2013   pulm vasc congest 05/2013     Acute encephalopathy 11/19/2017   Alcoholism in remission (HCC)    states last alcohol 11 yrs ago   Anemia    Anxiety    Bipolar 1 disorder (HCC)    Admission for mania x 2 (most recent 11/2017)   Cancer (HCC)    Cervical spondylosis without myelopathy 12/19/2013   MRI 02/2014: multilevel DDD/spondylosis, not much change compared to prior MRI.  Pt not in favor of invasive therapy for her neck as of 04/2014.   Chest pain 07/11/2018   Cholelithiasis    COPD (chronic obstructive pulmonary disease) (HCC)    Moderate: anoro started 04/2017 by pulm, pt symptomatically improved and PFTs stable at f/u 06/2017.   Coronary artery spasm (HCC) 07/11/2018  nitrates=HA. Amlodipine=intol swelling.  Changed to coreg 09/03/18 by cardiologist.   Depression    Dilutional hyponatremia    Endometritis 05/2017   possible; empiric tx with Flagyl by GYN.   Environmental allergies    Fibromyalgia    GERD (gastroesophageal reflux disease)    Hepatic steatosis 03/2019   u/s abd   Herpes zoster 08/07/2017   L scapular region   Hiatal hernia    History of adenomatous polyp of colon 04/2008;  08/2014   No high grade dysplasia: recall 5 yrs (Dr. Bosie Clos, [facility])   History of basal cell carcinoma excision    NOSE   History of Helicobacter pylori infection 04/2008   +gastric biopsy (gastritis but no metaplasia, dysplasia, or malignancy identified)   History of kidney stones    History of TIA (transient ischemic attack)    secondary to HELLP syndrome 1994 post c/s--  residual memory loss   Hyperlipidemia    statin started after her NSTEMI: goal  LDL <70.   Hypertension    Infection of urinary tract 06/05/2015   Internal hemorrhoids    Ischemic cardiomyopathy 05/2018   Echo EF 45-50% in context of NSTEMI from CA spasm. 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 Charlsie Merles 04/2017.   Leg pain    ABIs 11/2019: normal    Memory loss    Abnl MRI brain and CT brain c/w chronic microvascular ischemia.  Repeat MRI brain 02/2014 stable (Dr. Radonna Ricker with no plan to f/u with neuro as of 04/2014)   NSTEMI (non-ST elevated myocardial infarction) (HCC) 05/2018   Echo EF 45-50% in context of NSTEMI from CA spasm.// Myoview 8/21: EF 66, no ischemia, low risk   Osteoarthritis of left wrist    STT and 1st [facility] jt   Osteoporosis 05/2010   2012 'penia; 06/2015 'porosis:  prolia.  DEXA 09/2017 T-score -2.2 femur neck. 11/2021 T score -2.2. Rpt 2 yrs   Pelvic floor dysfunction    [facility] (Dr. Marlou Porch)   Pneumonia    Postmenopausal vaginal bleeding 05/2017   x 1 small episode; GYN attempted endo bx but unable to penetrate cervix due to severe cerv stenosis (due to postmenopausal state + hx of LEEP).  Endo u/s showed thin endo lining.  Per GYN---no evidence of endomet pathology on w/u---obs as of 07/2017.   Pre-diabetes    Prediabetes 06/2016   Fasting gluc  105; HbA1c at that time was 6.0%.  A1c 6.2% 12/2017.  A1c 5.7% Feb 2023.   Recurrent kidney stones 08/2013   Left ureteral calculus: Perc nephr--cystoscopy w/ureteroscopy + laser for stone removal.  Residual asymptomatic left renal nephrolithiasis <29mm present post-procedure.  Right sided hydronephrosis-persistent (on u/s)--urol ordered CT to further eval 08/2016: 1.6 cm nonobstructing stone lower pole left kidney, no hydro, plan for PCN extraction [facility])   Renal calculus 08/05/2013   Shortness of breath    Sprain of neck 12/19/2013   Stroke (HCC)    TIA   Suprapubic discomfort 06/05/2015   TMJ (temporomandibular joint disorder)    USES  MOUTH GUARD   Toe fracture, left 12/2017   2nd toe prox phalanx (immobilization, Dr. Luciana Axe)   Tubular adenoma of colon    Urinary frequency 08/01/2013   Vitamin D deficiency 05/2011   Weak urinary stream  07/2016   with elevated PVR and mild left hydronephrosis secondary to this ([facility] started flomax 0.4mg  qd for this).   Past Surgical History:  Procedure Laterality Date   ABIs  11/29/2019   NORMAL   ANTERIOR CERVICAL DECOMP/DISCECTOMY FUSION  06/30/2009   C3 -- C5  AND EXPLORATION OF FUSION C5-7 W/  PLATE REMOVAL   ARTHRODESIS METATARSALPHALANGEAL JOINT (MTPJ) Left 07/08/2021   Procedure: Left hallux metatarsophalangeal joint arthrodesis;  Surgeon: Toni Arthurs, MD;  Location: [facility];  Service: Orthopedics;  Laterality: Left;   BACK SURGERY     BUNIONECTOMY Left    CARDIOVASCULAR STRESS TEST  12/03/2019   myoc perf im: NORMAL   CERVICAL FUSION  2002   anterior C5 -- C7   CERVICAL SPINE SURGERY  08/27/1999     C6 -- T1  LAMINECTOMY/  DISKECTOMY   CESAREAN SECTION  1994   CHOLECYSTECTOMY N/A 03/06/2020   Procedure: LAPAROSCOPIC CHOLECYSTECTOMY;  Surgeon: Quentin Ore, MD;  Location: [facility];  Service: General;  Laterality: N/A;   COLONOSCOPY W/ POLYPECTOMY  04/2008;08/2014   2016 tubular adenoma x 1;  +diverticulosis and int/ext hemorrhoids.  06/2020 adenoma, recall 5 yrs.   CYSTOSCOPY WITH URETEROSCOPY AND STENT PLACEMENT Left 08/28/2013   Procedure: CYSTOSCOPY, RGP,  WITH URETEROSCOPY AND STENT REMOVAL;  Surgeon: Valetta Fuller, MD;  Location: [facility];  Service: Urology;  Laterality: Left;   DEXA  09/2017   T-score -2.2 femur neck: improved compared to 06/2015.  2021 DEXA T score -2.3.  11/2021 DEXA T score -2.2.   ESOPHAGOGASTRODUODENOSCOPY  2010; 08/2014   2016 +Candidal esophagitis; Mild chronic gastritis w/intestinal metaplasia--NEG H pylori, neg for eosinophilic esoph. 06/2020 esoph candidiasis (diflucan x 20d), o/w normal.   HAMMER TOE SURGERY Left 07/08/2021   Procedure: Second metatarsal head excision; revision Second hammertoe correction; 2-5 percutaneus flexor tenotomies;  Surgeon: Toni Arthurs, MD;  Location: [facility];  Service: Orthopedics;  Laterality: Left;   HARDWARE REMOVAL Left 07/08/2021   Procedure: Removal of deep implants from hallux proximal phalanx and First and Second metatarsals;  Surgeon: Toni Arthurs, MD;  Location: [facility];  Service: Orthopedics;  Laterality: Left;   HOLMIUM LASER APPLICATION Left 08/28/2013   Procedure: HOLMIUM LASER APPLICATION;  Surgeon: Valetta Fuller, MD;  Location: [facility];  Service: Urology;  Laterality: Left;   JOINT REPLACEMENT     LEFT HEART CATH AND CORONARY ANGIOGRAPHY N/A 05/15/2018   Evidence for spasm in distal LAD.  Mod RCA atherosclerosis, o/w no CAD.  Procedure: LEFT HEART CATH AND CORONARY ANGIOGRAPHY;  Surgeon: Marykay Lex, MD;  Location: [facility];  Service: Cardiovascular;  Laterality: N/A;   NEGATIVE SLEEP STUDY  04/01/2007   NEPHROLITHOTOMY Left 08/05/2013   Procedure: NEPHROLITHOTOMY PERCUTANEOUS;  Surgeon: Valetta Fuller, MD;  Location: [facility];  Service: Urology;  Laterality: Left;   POLYSOMNOGRAM  01/2019   NORMAL   POLYSOMNOGRAPHY   10/25/2018   Dr. Luetta Nutting due to pt inadequat sleep time (83 min).   TONSILLECTOMY AND ADENOIDECTOMY  1975   TOTAL KNEE ARTHROPLASTY Right 2003   TOTAL KNEE REVISION Right 01/19/2022   Procedure: Right knee polyethylene revision;  Surgeon: Ollen Gross, MD;  Location: [facility];  Service: Orthopedics;  Laterality: Right;   TRANSTHORACIC ECHOCARDIOGRAM  05/2018; 12/03/2019   (after MI secondary to arterial spasm): EF 45-50%; severe hypokinesis of apical anterolateral, inferolateral , anterior, and inferior LV myocardium. 11/2019  EF 55-60%, valves fine   ULTRASOUND EXAM,PELVIC COMPLETE ([facility] HX)  06/25/2015   NORMAL (done by GYN)     A IV Location/Drains/Wounds Patient Lines/Drains/Airways Status     Active Line/Drains/Airways     Name Placement date Placement time Site Days   Peripheral IV 01/28/23 20 G Left Antecubital 01/28/23  --  Antecubital  less than 1   Incision - 4 Ports Abdomen 1: Umbilicus 2: Mid;Upper 3: Medial;Mid 4: Right;Lateral 03/06/20  --  -- 1058            Intake/Output Last 24 hours  Intake/Output Summary (Last 24 hours) at 01/28/2023 1429 Last data filed at 01/28/2023 1238 Gross per 24 hour  Intake 1000 ml  Output --  Net 1000 ml    Labs/Imaging Results for orders placed or performed during the hospital encounter of 01/28/23 (from the past 48 hour(s))  Comprehensive metabolic panel     Status: Abnormal   Collection Time: 01/28/23 10:50 AM  Result Value Ref Range   Sodium 127 (L) 135 - 145 mmol/L   Potassium 3.3 (L) 3.5 - 5.1 mmol/L   Chloride 94 (L) 98 - 111 mmol/L   CO2 21 (L) 22 - 32 mmol/L   Glucose, Bld 140 (H) 70 - 99 mg/dL    Comment: Glucose reference range applies only to samples taken after fasting for at least 8 hours.   BUN 29 (H) 8 - 23 mg/dL   Creatinine, Ser 0.98 (H) 0.44 - 1.00 mg/dL   Calcium 8.4 (L) 8.9 - 10.3 mg/dL   Total Protein 6.0 (L) 6.5 - 8.1 g/dL   Albumin 3.4 (L) 3.5 - 5.0 g/dL   AST 44 (H) 15 - 41  U/L   ALT 31 0 - 44 U/L   Alkaline Phosphatase 82 38 - 126 U/L   Total Bilirubin 1.4 (H) 0.3 - 1.2 mg/dL   GFR, Estimated 35 (L) >60 mL/min    Comment: (NOTE) Calculated using the CKD-EPI Creatinine Equation (2021)    Anion gap 12 5 - 15    Comment: Performed at [facility], [address], [place], [place] [address]  CBC with Differential     Status: Abnormal   Collection Time: 01/28/23 10:50 AM  Result Value Ref Range   WBC 12.5 (H) 4.0 - 10.5 K/uL   RBC 3.35 (L) 3.87 - 5.11 MIL/uL   Hemoglobin 9.9 (L) 12.0 - 15.0 g/dL   HCT 78.2 (L) 95.6 - 21.3 %   MCV 89.0 80.0 - 100.0 fL   MCH 29.6 26.0 - 34.0 pg   MCHC 33.2 30.0 - 36.0 g/dL   RDW 08.6 57.8 - 46.9 %   Platelets 200 150 - 400 K/uL   nRBC 0.0 0.0 - 0.2 %   Neutrophils Relative % 87 %   Neutro Abs 10.9 (H) 1.7 - 7.7 K/uL   Lymphocytes Relative 3 %   Lymphs Abs 0.4 (L) 0.7 - 4.0 K/uL   Monocytes Relative 9 %   Monocytes Absolute 1.1 (H) 0.1 - 1.0 K/uL   Eosinophils Relative 0 %   Eosinophils Absolute 0.0 0.0 - 0.5 K/uL   Basophils Relative 0 %   Basophils Absolute 0.0 0.0 - 0.1 K/uL   Immature Granulocytes 1 %   Abs Immature Granulocytes 0.11 (H) 0.00 - 0.07 K/uL    Comment: Performed at [facility], [address] [place], [place] [address]  Protime-INR     Status: None   Collection Time: 01/28/23  10:50 AM  Result Value Ref Range   Prothrombin Time 14.9 11.4 - 15.2 seconds   INR 1.1 0.8 - 1.2    Comment: (NOTE) INR goal varies based on device and disease states. Performed at [facility], [address], [place], [place] [address]   APTT     Status: None   Collection Time: 01/28/23 10:50 AM  Result Value Ref Range   aPTT 31 24 - 36 seconds    Comment: Performed at [facility], [address], [place], [place] [address]  CK     Status: Abnormal   Collection Time: 01/28/23 10:50 AM  Result Value Ref Range   Total CK 1,118 (H) 38 - 234 U/L    Comment: Performed at [facility]  Lab, [address], [place], [place] [address]  Resp panel by RT-PCR (RSV, Flu A&B, Covid) Anterior Nasal Swab     Status: None   Collection Time: 01/28/23 11:14 AM   Specimen: Anterior Nasal Swab  Result Value Ref Range   SARS Coronavirus 2 by RT PCR NEGATIVE NEGATIVE   Influenza A by PCR NEGATIVE NEGATIVE   Influenza B by PCR NEGATIVE NEGATIVE    Comment: (NOTE) The Xpert Xpress SARS-CoV-2/FLU/RSV plus assay is intended as an aid in the diagnosis of influenza from Nasopharyngeal swab specimens and should not be used as a sole basis for treatment. Nasal washings and aspirates are unacceptable for Xpert Xpress SARS-CoV-2/FLU/RSV testing.  Fact Sheet for Patients: BloggerCourse.com  Fact Sheet for Healthcare Providers: SeriousBroker.it  This test is not yet approved or cleared by the [place] FDA and has been authorized for detection and/or diagnosis of SARS-CoV-2 by FDA under an Emergency Use Authorization (EUA). This EUA will remain in effect (meaning this test can be used) for the duration of the COVID-19 declaration under Section 564(b)(1) of the Act, 21 U.S.C. section 360bbb-3(b)(1), unless the authorization is terminated or revoked.     Resp Syncytial Virus by PCR NEGATIVE NEGATIVE    Comment: (NOTE) Fact Sheet for Patients: BloggerCourse.com  Fact Sheet for Healthcare Providers: SeriousBroker.it  This test is not yet approved or cleared by the [place] FDA and has been authorized for detection and/or diagnosis of SARS-CoV-2 by FDA under an Emergency Use Authorization (EUA). This EUA will remain in effect (meaning this test can be used) for the duration of the COVID-19 declaration under Section 564(b)(1) of the Act, 21 U.S.C. section 360bbb-3(b)(1), unless the authorization is terminated or revoked.  Performed at [facility], [address],  [place], [place] [address]   Urinalysis, w/ Reflex to Culture (Infection Suspected) -Urine, Clean Catch     Status: Abnormal   Collection Time: 01/28/23 11:23 AM  Result Value Ref Range   Specimen Source URINE, CLEAN CATCH    Color, Urine YELLOW YELLOW   APPearance CLEAR CLEAR   Specific Gravity, Urine 1.020 1.005 - 1.030   pH 5.5 5.0 - 8.0   Glucose, UA NEGATIVE NEGATIVE mg/dL   Hgb urine dipstick MODERATE (A) NEGATIVE   Bilirubin Urine NEGATIVE NEGATIVE   Ketones, ur NEGATIVE NEGATIVE mg/dL   Protein, ur 30 (A) NEGATIVE mg/dL   Nitrite POSITIVE (A) NEGATIVE   Leukocytes,Ua MODERATE (A) NEGATIVE   Squamous Epithelial / HPF 0-5 0 - 5 /HPF   WBC, UA 11-20 0 - 5 WBC/hpf    Comment: Reflex urine culture not performed if WBC <=10, OR if Squamous epithelial cells >5. If Squamous epithelial cells >  5, suggest recollection.   RBC / HPF 6-10 0 - 5 RBC/hpf   Bacteria, UA FEW (A) NONE SEEN   Mucus PRESENT     Comment: Performed at [facility], [address], [place], [place] [address]  I-Stat Lactic Acid, ED     Status: Abnormal   Collection Time: 01/28/23 11:46 AM  Result Value Ref Range   Lactic Acid, Venous 2.2 (HH) 0.5 - 1.9 mmol/L   Comment NOTIFIED PHYSICIAN   I-Stat Lactic Acid, ED     Status: Abnormal   Collection Time: 01/28/23  1:27 PM  Result Value Ref Range   Lactic Acid, Venous 2.1 (HH) 0.5 - 1.9 mmol/L   Comment NOTIFIED PHYSICIAN    *Note: Due to a large number of results and/or encounters for the requested time period, some results have not been displayed. A complete set of results can be found in Results Review.   CT Head Wo Contrast  Result Date: 01/28/2023 CLINICAL DATA:  Head trauma, minor. EXAM: CT HEAD WITHOUT CONTRAST CT CERVICAL SPINE WITHOUT CONTRAST TECHNIQUE: Multidetector CT imaging of the head and cervical spine was performed following the standard protocol without intravenous contrast. Multiplanar CT image reconstructions of the cervical spine were also  generated. RADIATION DOSE REDUCTION: This exam was performed according to the departmental dose-optimization program which includes automated exposure control, adjustment of the mA and/or kV according to patient size and/or use of iterative reconstruction technique. COMPARISON:  09/30/2021 FINDINGS: CT HEAD FINDINGS Brain: No evidence of acute infarction, hemorrhage, hydrocephalus, extra-axial collection or mass lesion/mass effect. Prominence of the sulci and ventricles. There is mild diffuse low-attenuation within the subcortical and periventricular white matter compatible with chronic microvascular disease. Vascular: No hyperdense vessel or unexpected calcification. Skull: Normal. Negative for fracture or focal lesion. Sinuses/Orbits: None Other: . CT CERVICAL SPINE FINDINGS Alignment: No acute, posttraumatic malalignment.Unchanged chronic anterolisthesis of C2 on C3 which measures 4.8 cm on today's study, image 37/9. Previously this measured the same. Anterolisthesis of C7 on T1 measures 3.8 cm, also unchanged from previous exam Skull base and vertebrae: No acute fracture. No primary bone lesion or focal pathologic process. Soft tissues and spinal canal: No prevertebral fluid or swelling. No visible canal hematoma. Disc levels: Prior C3 through C7 ACDF status post C6 and C7 hardware removal. 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 Prior left laminectomy at C7. Severe left uncovertebral hypertrophy at C2-3. Upper chest: Negative. Other: None IMPRESSION: 1. No acute intracranial abnormalities. 2. Chronic microvascular disease and cerebral atrophy. 3. No evidence for cervical spine fracture or subluxation. 4. Prior C3 through C7 ACDF status post C6 and C7 hardware removal. 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 5. Unchanged chronic anterolisthesis of C2 on C3 and C7 on T1. Electronically Signed   By: Signa Kell M.D.   On: 01/28/2023 13:12   CT Cervical Spine Wo Contrast  Result Date:  01/28/2023 CLINICAL DATA:  Head trauma, minor. EXAM: CT HEAD WITHOUT CONTRAST CT CERVICAL SPINE WITHOUT CONTRAST TECHNIQUE: Multidetector CT imaging of the head and cervical spine was performed following the standard protocol without intravenous contrast. Multiplanar CT image reconstructions of the cervical spine were also generated. RADIATION DOSE REDUCTION: This exam was performed according to the departmental dose-optimization program which includes automated exposure control, adjustment of the mA and/or kV according to patient size and/or use of iterative reconstruction technique. COMPARISON:  09/30/2021 FINDINGS: CT HEAD FINDINGS Brain: No evidence of acute infarction, hemorrhage, hydrocephalus, extra-axial collection or mass lesion/mass effect. Prominence  of the sulci and ventricles. There is mild diffuse low-attenuation within the subcortical and periventricular white matter compatible with chronic microvascular disease. Vascular: No hyperdense vessel or unexpected calcification. Skull: Normal. Negative for fracture or focal lesion. Sinuses/Orbits: None Other: . CT CERVICAL SPINE FINDINGS Alignment: No acute, posttraumatic malalignment.Unchanged chronic anterolisthesis of C2 on C3 which measures 4.8 cm on today's study, image 37/9. Previously this measured the same. Anterolisthesis of C7 on T1 measures 3.8 cm, also unchanged from previous exam Skull base and vertebrae: No acute fracture. No primary bone lesion or focal pathologic process. Soft tissues and spinal canal: No prevertebral fluid or swelling. No visible canal hematoma. Disc levels: Prior C3 through C7 ACDF status post C6 and C7 hardware removal. 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 Prior left laminectomy at C7. Severe left uncovertebral hypertrophy at C2-3. Upper chest: Negative. Other: None IMPRESSION: 1. No acute intracranial abnormalities. 2. Chronic microvascular disease and cerebral atrophy. 3. No evidence for cervical spine fracture  or subluxation. 4. Prior C3 through C7 ACDF status post C6 and C7 hardware removal. 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 5. Unchanged chronic anterolisthesis of C2 on C3 and C7 on T1. Electronically Signed   By: Signa Kell M.D.   On: 01/28/2023 13:12   DG Chest Port 1 View  Result Date: 01/28/2023 CLINICAL DATA:  [facility]-DGQuestionable sepsis - evaluate for abnormality fall. Found on floor. EXAM: PORTABLE CHEST 1 VIEW COMPARISON:  01/12/2023 FINDINGS: Normal mediastinum and cardiac silhouette. Normal pulmonary vasculature. No evidence of effusion, infiltrate, or pneumothorax. No acute bony abnormality. IMPRESSION: No acute cardiopulmonary process. Electronically Signed   By: Genevive Bi M.D.   On: 01/28/2023 12:20   DG Hip Unilat W or Wo Pelvis 2-3 Views Right  Result Date: 01/28/2023 CLINICAL DATA:  72 year old female with history of trauma from a fall. Right hip pain. EXAM: DG HIP (WITH OR WITHOUT PELVIS) 2-3V RIGHT COMPARISON:  No priors. FINDINGS: Three views of the bony pelvis in the right hip demonstrate no acute displaced fracture of the bony pelvic ring. There is a minimally displaced mildly impacted transcervical right femoral neck fracture. Femoral head remains located. Visualized portions of the left proximal femur appear intact. There is joint space narrowing, subchondral sclerosis, subchondral cyst formation and osteophyte formation in the hip joints bilaterally, indicative of osteoarthritis. IMPRESSION: 1. Minimally displaced likely mildly impacted transcervical right femoral neck fracture, as above. Electronically Signed   By: Trudie Reed M.D.   On: 01/28/2023 12:20    Pending Labs Unresulted Labs (From admission, onward)     Start     Ordered   01/29/23 0500  CBC  Tomorrow morning,   R        01/28/23 1427   01/29/23 0500  Basic metabolic panel  Tomorrow morning,   R        01/28/23 1427   01/29/23 0500  Type and screen [facility]  Once,   R        Comments: [facility]    01/28/23 1427   01/28/23 1500  Basic metabolic panel  Once,   R        01/28/23 1427   01/28/23 1409  HIV Antibody (routine testing w rflx)  (HIV Antibody (Routine testing w reflex) panel)  Once,   R        01/28/23 1427   01/28/23 1123  Urine Culture  Once,   R        01/28/23 1123  01/28/23 1113  Blood Culture (routine x 2)  (Undifferentiated presentation (screening labs and basic nursing orders))  BLOOD CULTURE X 2,   STAT      01/28/23 1113            Vitals/Pain Today's Vitals   01/28/23 1115 01/28/23 1224 01/28/23 1230 01/28/23 1300  BP: (!) 118/52  (!) 116/52 (!) 120/55  Pulse: (!) 129 (!) 116 (!) 114 (!) 110  Resp: (!) 26 (!) 22 (!) 24 (!) 24  Temp:  98.6 F (37 C)    TempSrc:  Oral    SpO2: 96% 98% 97% 98%  Weight:      Height:      PainSc:        Isolation Precautions No active isolations  Medications Medications  aztreonam (AZACTAM) 1 g in sodium chloride 0.9 % 100 mL IVPB (1 g Intravenous New Bag/Given 01/28/23 1414)  aspirin EC tablet 81 mg (has no administration in time range)  metoprolol succinate (TOPROL-XL) 24 hr tablet 100 mg (has no administration in time range)  buPROPion (WELLBUTRIN XL) 24 hr tablet 300 mg (has no administration in time range)  citalopram (CELEXA) tablet 20 mg (has no administration in time range)  lamoTRIgine (LAMICTAL) tablet 300 mg (has no administration in time range)  zolpidem (AMBIEN) tablet 5 mg (has no administration in time range)  ALPRAZolam (XANAX) tablet 0.5 mg (has no administration in time range)  pantoprazole (PROTONIX) EC tablet 40 mg (has no administration in time range)  arformoterol (BROVANA) nebulizer solution 15 mcg (has no administration in time range)    And  umeclidinium bromide (INCRUSE ELLIPTA) 62.5 MCG/ACT 1 puff (has no administration in time range)  montelukast (SINGULAIR) tablet 10 mg (has no administration in time range)  albuterol (VENTOLIN HFA) 108 (90  Base) MCG/ACT inhaler 2 puff (has no administration in time range)  HYDROcodone-acetaminophen (NORCO/VICODIN) 5-325 MG per tablet 1-2 tablet (has no administration in time range)  HYDROmorphone (DILAUDID) injection 0.5 mg (has no administration in time range)  polyethylene glycol (MIRALAX / GLYCOLAX) packet 17 g (has no administration in time range)  lactated ringers bolus 1,000 mL (0 mLs Intravenous Stopped 01/28/23 1238)    Mobility Walks (not ambulatory here)     Focused Assessments Pulmonary Assessment Handoff:  Lung sounds:   O2 Device: Room Air      R Recommendations: See Admitting Provider Note  Report given to:   Additional Notes:

## 2023-01-28 NOTE — ED Notes (Signed)
Patient placed on purwic.

## 2023-01-28 NOTE — H&P (Signed)
History and Physical   Erin Lopez JXB:[id number] DOB: 1951-01-15 DOA: 01/28/2023  PCP: Jeoffrey Massed, MD   Patient coming from: Home  Chief Complaint: Fall  HPI: Erin Lopez is a 72 y.o. female with medical history significant of hypertension, hyperlipidemia, COPD, CKD 2, history of TIA, GERD, bipolar disorder, memory difficulties presenting after a fall at home.  Patient reports she was walking her home last night and lost her balance and then fell on her right side.  Was unable to get up immediately afterwards and experienced significant pain on the right side.  Now in the bed she is unable to lift her leg while laying flat.  Eventually she was found and brought to the ED for further evaluation.  Does report some subjective chills and fever overnight last night and reports she recently was treated for pneumonia.  Denies chest pain, shortness of breath, abdominal pain, constipation, diarrhea, nausea, vomiting.  ED Course: Vital signs in ED notable for blood pressure in the 110s to 120 systolic, heart rate in the 110s to 130s, respirate in the 20s.  Lab workup included CMP with sodium 127, potassium 3.3, bicarb 21, BUN 29, creatinine elevated to 1.58 from baseline of 1.0, glucose 140, calcium 8.4, protein 6.0, albumin 3.4, AST 44, T. bili 1.4.  CBC with leukocytosis to 12.5, hemoglobin 9.9 which is near baseline of 10-12.  PT, PTT, INR within normal limits.  CK mildly elevated to 1118.  Lactic acid borderline elevated at 2.2 and then 2.1 on repeat.  Respiratory panel for flu COVID RSV negative.  Urinalysis with hemoglobin, protein, nitrates, leukocytes, bacteria.  Urine culture and blood cultures pending.  Chest x-ray showed no acute abnormality.  Hip x-ray showed minimally displaced and likely mildly impacted transcervical right femoral neck fracture.  CT head and CT C-spine showed no acute normality.  CT C-spine did demonstrate patient status post C3-7 ACDF and status post C6-7  hardware removal.  Patient received 1 L IV fluids and aztreonam in the ED.  Aztreonam selected due to patient history of anaphylaxis to cephalosporin and quinolone.  Orthopedics consulted in the ED.  Review of Systems: As per HPI otherwise all other systems reviewed and are negative.  Past Medical History:  Diagnosis Date   Abnormal chest x-ray 06/30/2013   pulm vasc congest 05/2013     Acute encephalopathy 11/19/2017   Alcoholism in remission [facility])    states last alcohol 11 yrs ago   Anemia    Anxiety    Bipolar 1 disorder (HCC)    Admission for mania x 2 (most recent 11/2017)   Cancer (HCC)    Cervical spondylosis without myelopathy 12/19/2013   MRI 02/2014: multilevel DDD/spondylosis, not much change compared to prior MRI.  Pt not in favor of invasive therapy for her neck as of 04/2014.   Chest pain 07/11/2018   Cholelithiasis    COPD (chronic obstructive pulmonary disease) (HCC)    Moderate: anoro started 04/2017 by pulm, pt symptomatically improved and PFTs stable at f/u 06/2017.   Coronary artery spasm (HCC) 07/11/2018   nitrates=HA. Amlodipine=intol swelling.  Changed to coreg 09/03/18 by cardiologist.   Depression    Dilutional hyponatremia    Endometritis 05/2017   possible; empiric tx with Flagyl by GYN.   Environmental allergies    Fibromyalgia    GERD (gastroesophageal reflux disease)    Hepatic steatosis 03/2019   u/s abd   Herpes zoster 08/07/2017   L scapular region   Hiatal hernia  History of adenomatous polyp of colon 04/2008; 08/2014   No high grade dysplasia: recall 5 yrs (Dr. Bosie Clos, [facility])   History of basal cell carcinoma excision    NOSE   History of Helicobacter pylori infection 04/2008   +gastric biopsy (gastritis but no metaplasia, dysplasia, or malignancy identified)   History of kidney stones    History of TIA (transient ischemic attack)    secondary to HELLP syndrome 1994 post c/s--  residual memory loss   Hyperlipidemia    statin started after  her NSTEMI: goal  LDL <70.   Hypertension    Infection of urinary tract 06/05/2015   Internal hemorrhoids    Ischemic cardiomyopathy 05/2018   Echo EF 45-50% in context of NSTEMI from CA spasm. 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 Charlsie Merles 04/2017.   Leg pain    ABIs 11/2019: normal    Memory loss    Abnl MRI brain and CT brain c/w chronic microvascular ischemia.  Repeat MRI brain 02/2014 stable (Dr. Radonna Ricker with no plan to f/u with neuro as of 04/2014)   NSTEMI (non-ST elevated myocardial infarction) (HCC) 05/2018   Echo EF 45-50% in context of NSTEMI from CA spasm.// Myoview 8/21: EF 66, no ischemia, low risk   Osteoarthritis of left wrist    STT and 1st [facility] jt   Osteoporosis 05/2010   2012 'penia; 06/2015 'porosis:  prolia.  DEXA 09/2017 T-score -2.2 femur neck. 11/2021 T score -2.2. Rpt 2 yrs   Pelvic floor dysfunction    [facility] (Dr. Marlou Porch)   Pneumonia    Postmenopausal vaginal bleeding 05/2017   x 1 small episode; GYN attempted endo bx but unable to penetrate cervix due to severe cerv stenosis (due to postmenopausal state + hx of LEEP).  Endo u/s showed thin endo lining.  Per GYN---no evidence of endomet pathology on w/u---obs as of 07/2017.   Pre-diabetes    Prediabetes 06/2016   Fasting gluc 105; HbA1c at that time was 6.0%.  A1c 6.2% 12/2017.  A1c 5.7% Feb 2023.   Recurrent kidney stones 08/2013   Left ureteral calculus: Perc nephr--cystoscopy w/ureteroscopy + laser for stone removal.  Residual asymptomatic left renal nephrolithiasis <16mm present post-procedure.  Right sided hydronephrosis-persistent (on u/s)--urol ordered CT to further eval 08/2016: 1.6 cm nonobstructing stone lower pole left kidney, no hydro, plan for PCN extraction  [facility])   Renal calculus 08/05/2013   Shortness of breath    Sprain of neck 12/19/2013   Stroke (HCC)    TIA   Suprapubic discomfort 06/05/2015   TMJ (temporomandibular joint disorder)    USES  MOUTH GUARD   Toe fracture, left 12/2017   2nd toe prox phalanx (immobilization, Dr. Luciana Axe)   Tubular adenoma of colon    Urinary frequency 08/01/2013   Vitamin D deficiency 05/2011   Weak urinary stream 07/2016   with elevated PVR and mild left hydronephrosis secondary to this ([facility] started flomax 0.4mg  qd for this).    Past Surgical History:  Procedure Laterality Date   ABIs  11/29/2019   NORMAL   ANTERIOR CERVICAL DECOMP/DISCECTOMY FUSION  06/30/2009   C3 -- C5  AND EXPLORATION OF FUSION C5-7 W/  PLATE REMOVAL   ARTHRODESIS METATARSALPHALANGEAL JOINT (MTPJ) Left 07/08/2021   Procedure: Left  hallux metatarsophalangeal joint arthrodesis;  Surgeon: Toni Arthurs, MD;  Location: [facility];  Service: Orthopedics;  Laterality: Left;   BACK SURGERY     BUNIONECTOMY Left    CARDIOVASCULAR STRESS TEST  12/03/2019   myoc perf im: NORMAL   CERVICAL FUSION  2002   anterior C5 -- C7   CERVICAL SPINE SURGERY  08/27/1999     C6 -- T1  LAMINECTOMY/  DISKECTOMY   CESAREAN SECTION  1994   CHOLECYSTECTOMY N/A 03/06/2020   Procedure: LAPAROSCOPIC CHOLECYSTECTOMY;  Surgeon: Quentin Ore, MD;  Location: [facility];  Service: General;  Laterality: N/A;   COLONOSCOPY W/ POLYPECTOMY  04/2008;08/2014   2016 tubular adenoma x 1; +diverticulosis and int/ext hemorrhoids.  06/2020 adenoma, recall 5 yrs.   CYSTOSCOPY WITH URETEROSCOPY AND STENT PLACEMENT Left 08/28/2013   Procedure: CYSTOSCOPY, RGP,  WITH URETEROSCOPY AND STENT REMOVAL;  Surgeon: Valetta Fuller, MD;  [facility];  Service: Urology;  Laterality: Left;   DEXA  09/2017   T-score -2.2 femur neck: improved compared to 06/2015.  2021 DEXA T score -2.3.  11/2021 DEXA T score -2.2.    ESOPHAGOGASTRODUODENOSCOPY  2010; 08/2014   2016 +Candidal esophagitis; Mild chronic gastritis w/intestinal metaplasia--NEG H pylori, neg for eosinophilic esoph. 06/2020 esoph candidiasis (diflucan x 20d), o/w normal.   HAMMER TOE SURGERY Left 07/08/2021   Procedure: Second metatarsal head excision; revision Second hammertoe correction; 2-5 percutaneus flexor tenotomies;  Surgeon: Toni Arthurs, MD;  Location: [facility];  Service: Orthopedics;  Laterality: Left;   HARDWARE REMOVAL Left 07/08/2021   Procedure: Removal of deep implants from hallux proximal phalanx and First and Second metatarsals;  Surgeon: Toni Arthurs, MD;  Location: [facility];  Service: Orthopedics;  Laterality: Left;   HOLMIUM LASER APPLICATION Left 08/28/2013   Procedure: HOLMIUM LASER APPLICATION;  Surgeon: Valetta Fuller, MD;  Location: [facility];  Service: Urology;  Laterality: Left;   JOINT REPLACEMENT     LEFT HEART CATH AND CORONARY ANGIOGRAPHY N/A 05/15/2018   Evidence for spasm in distal LAD.  Mod RCA atherosclerosis, o/w no CAD.  Procedure: LEFT HEART CATH AND CORONARY ANGIOGRAPHY;  Surgeon: Marykay Lex, MD;  Location: [facility];  Service: Cardiovascular;  Laterality: N/A;   NEGATIVE SLEEP STUDY  04/01/2007   NEPHROLITHOTOMY Left 08/05/2013   Procedure: NEPHROLITHOTOMY PERCUTANEOUS;  Surgeon: Valetta Fuller, MD;  Location: [facility];  Service: Urology;  Laterality: Left;   POLYSOMNOGRAM  01/2019   NORMAL   POLYSOMNOGRAPHY  10/25/2018   Dr. Luetta Nutting due to pt inadequat sleep time (83 min).   TONSILLECTOMY AND ADENOIDECTOMY  1975   TOTAL KNEE ARTHROPLASTY Right 2003   TOTAL KNEE REVISION Right 01/19/2022   Procedure: Right knee polyethylene revision;  Surgeon: Ollen Gross, MD;  Location: [facility];  Service: Orthopedics;  Laterality: Right;   TRANSTHORACIC ECHOCARDIOGRAM  05/2018; 12/03/2019   (after MI secondary to arterial spasm): EF  45-50%; severe hypokinesis of apical anterolateral, inferolateral , anterior, and inferior LV myocardium. 11/2019 EF 55-60%, valves fine   ULTRASOUND EXAM,PELVIC COMPLETE ([facility] HX)  06/25/2015   NORMAL (done by GYN)    Social History  reports that she quit smoking about 29 years ago. Her smoking use included cigarettes. She started smoking about 62 years ago. She has a 33 pack-year smoking history. She has never used smokeless tobacco. She reports that she does not drink alcohol and does not use drugs.  Allergies  Allergen Reactions   Ceftin [Cefuroxime Axetil] Anaphylaxis and Swelling    Swelling of eyes and tongue   Cipro [Ciprofloxacin Hcl] Anaphylaxis and Swelling   Risperdal [Risperidone] Palpitations    Generalized weakness Malaise  Hypertension   Fosamax [Alendronate Sodium] Diarrhea   Morphine And Codeine Other (See Comments)    Hallucinations Disorientation    Prednisone Other (See Comments)    Hyperactivity   Roxicodone [Oxycodone] Other (See Comments)    Previously addicted to medication   Seroquel [Quetiapine] Other (See Comments)    Oversedation     Family History  Problem Relation Age of Onset   Alcohol abuse Mother    Arthritis Mother    Hypertension Mother    Hyperlipidemia Mother    Diabetes Mother    Parkinsonism Mother    Breast cancer Mother    Prostate cancer Father    Hypertension Father    Hyperlipidemia Father    Diabetes Father    Heart disease Father    Esophageal cancer Sister    Stomach cancer Sister    Colon polyps Sister    Irritable bowel syndrome Sister    Colon polyps Brother    Heart disease Brother        x 2   Colon cancer Neg Hx   Reviewed on admission  Prior to Admission medications   Medication Sig Start Date End Date Taking? Authorizing Provider  acetaminophen (TYLENOL) 500 MG tablet Take 500-1,000 mg by mouth daily as needed for mild pain or fever.    [provider]  albuterol (VENTOLIN HFA) 108 (90 Base)  MCG/ACT inhaler Inhale 2 puffs into the lungs every 6 (six) hours as needed for wheezing or shortness of breath. 01/19/23   McGowen, Maryjean Morn, MD  ALPRAZolam Prudy Feeler) 0.5 MG tablet TAKE 1 TABLET BY MOUTH THREE TIMES A DAY AS NEEDED FOR ANXIETY 10/25/22   McGowen, Maryjean Morn, MD  Ascorbic Acid (VITAMIN C PO) Take 1 tablet by mouth daily.    [provider]  aspirin EC 81 MG tablet Take 81 mg by mouth daily. Swallow whole.    [provider]  atorvastatin (LIPITOR) 80 MG tablet TAKE 1 TABLET BY MOUTH DAILY. 10/12/22   McGowen, Maryjean Morn, MD  b complex vitamins capsule Take 1 capsule by mouth 2 (two) times daily.    [provider]  buPROPion (WELLBUTRIN XL) 300 MG 24 hr tablet Take 1 tablet (300 mg total) by mouth daily. 12/15/22   McGowen, Maryjean Morn, MD  Cholecalciferol (VITAMIN D-3 PO) Take 1 capsule by mouth daily.    [provider]  citalopram (CELEXA) 20 MG tablet Take 1 tablet (20 mg total) by mouth in the morning and at bedtime. 12/15/22   McGowen, Maryjean Morn, MD  famotidine (PEPCID) 40 MG tablet Take 1 tablet (40 mg total) by mouth daily. 10/12/22   McGowen, Maryjean Morn, MD  fluticasone (FLONASE) 50 MCG/ACT nasal spray SPRAY 2 SPRAYS INTO EACH NOSTRIL EVERY DAY 10/20/22   McGowen, Maryjean Morn, MD  hydrochlorothiazide (HYDRODIURIL) 12.5 MG tablet TAKE 1 TABLET BY MOUTH EVERY DAY 12/06/22   Quintella Reichert, MD  KLOR-CON M20 20 MEQ tablet TAKE 1 TABLET BY MOUTH EVERY DAY 01/23/23   McGowen, Maryjean Morn, MD  lamoTRIgine (LAMICTAL) 100 MG tablet TAKE 3 TABLETS (300 MG TOTAL) BY MOUTH AT BEDTIME 10/20/22   McGowen, Maryjean Morn, MD  losartan (COZAAR) 25 MG tablet TAKE 1 TABLET BY MOUTH EVERY DAY 12/23/22   Turner,  Cornelious Bryant, MD  MAGNESIUM OXIDE PO Take 1 tablet by mouth daily.    [provider]  meloxicam (MOBIC) 15 MG tablet TAKE 1 TABLET BY MOUTH ONCE DAILY AS NEEDED FOR JOINT PAIN 01/23/23   McGowen, Maryjean Morn, MD  methocarbamol (ROBAXIN) 500 MG tablet Take 1 tablet (500 mg total)  by mouth every 6 (six) hours as needed for muscle spasms. 01/20/22   Edmisten, Kristie L, PA  metoprolol succinate (TOPROL-XL) 100 MG 24 hr tablet TAKE 1 TABLET BY MOUTH EVERY DAY WITH OR IMMEDIATELY FOLLOWING A MEAL 06/16/22   Turner, Cornelious Bryant, MD  montelukast (SINGULAIR) 10 MG tablet TAKE 1 TABLET BY MOUTH EVERY DAY IN THE EVENING 11/09/22   McGowen, Maryjean Morn, MD  Multiple Vitamins-Minerals (ZINC PO) Take 1 tablet by mouth daily.    [provider]  nitroGLYCERIN (NITROSTAT) 0.4 MG SL tablet Place 1 tablet (0.4 mg total) under the tongue every 5 (five) minutes x 3 doses as needed for chest pain. 11/01/21   McGowen, Maryjean Morn, MD  oxyCODONE (OXY IR/ROXICODONE) 5 MG immediate release tablet 1 tab po tid prn severe pain 03/30/22   McGowen, Maryjean Morn, MD  pantoprazole (PROTONIX) 40 MG tablet TAKE 1 TABLET (40 MG TOTAL) BY MOUTH DAILY. KEEP F/U APPT FOR ANY FURTHER REFILLS 11/15/22   Pyrtle, Carie Caddy, MD  Tiotropium Bromide-Olodaterol (STIOLTO RESPIMAT) 2.5-2.5 MCG/ACT AERS INHALE 2 EACH INTO THE LUNGS DAILY. 03/14/22   Chilton Greathouse, MD  zolpidem (AMBIEN) 10 MG tablet TAKE 1 TABLET BY MOUTH EVERYDAY AT BEDTIME 10/25/22   Jeoffrey Massed, MD    Physical Exam: Vitals:   01/28/23 1115 01/28/23 1224 01/28/23 1230 01/28/23 1300  BP: (!) 118/52  (!) 116/52 (!) 120/55  Pulse: (!) 129 (!) 116 (!) 114 (!) 110  Resp: (!) 26 (!) 22 (!) 24 (!) 24  Temp:  98.6 F (37 C)    TempSrc:  Oral    SpO2: 96% 98% 97% 98%  Weight:      Height:        Physical Exam Constitutional:      General: She is not in acute distress.    Appearance: Normal appearance.  HENT:     Head: Normocephalic and atraumatic.     Mouth/Throat:     Mouth: Mucous membranes are moist.     Pharynx: Oropharynx is clear.  Eyes:     Extraocular Movements: Extraocular movements intact.     Pupils: Pupils are equal, round, and reactive to light.  Cardiovascular:     Rate and Rhythm: Regular rhythm. Tachycardia present.     Pulses:  Normal pulses.     Heart sounds: Normal heart sounds.  Pulmonary:     Effort: Pulmonary effort is normal. No respiratory distress.     Breath sounds: Normal breath sounds.  Abdominal:     General: Bowel sounds are normal. There is no distension.     Palpations: Abdomen is soft.     Tenderness: There is no abdominal tenderness.  Musculoskeletal:        General: No swelling or deformity.     Comments: Bilateral lower remedies neurovascularly intact. Right lower extremity foreshortened and externally rotated  Skin:    General: Skin is warm and dry.  Neurological:     General: No focal deficit present.     Mental Status: Mental status is at baseline.    Labs on Admission: I have personally reviewed following labs and imaging studies  CBC: Recent Labs  Lab 01/28/23 1050  WBC 12.5*  NEUTROABS 10.9*  HGB 9.9*  HCT 29.8*  MCV 89.0  PLT 200    Basic Metabolic Panel: Recent Labs  Lab 01/28/23 1050  NA 127*  K 3.3*  CL 94*  CO2 21*  GLUCOSE 140*  BUN 29*  CREATININE 1.58*  CALCIUM 8.4*    GFR: Estimated Creatinine Clearance: 35.8 mL/min (A) (by C-G formula based on SCr of 1.58 mg/dL (H)).  Liver Function Tests: Recent Labs  Lab 01/28/23 1050  AST 44*  ALT 31  ALKPHOS 82  BILITOT 1.4*  PROT 6.0*  ALBUMIN 3.4*    Urine analysis:    Component Value Date/Time   COLORURINE YELLOW 01/28/2023 1123   APPEARANCEUR CLEAR 01/28/2023 1123   LABSPEC 1.020 01/28/2023 1123   PHURINE 5.5 01/28/2023 1123   GLUCOSEU NEGATIVE 01/28/2023 1123   GLUCOSEU NEGATIVE 05/29/2013 1156   HGBUR MODERATE (A) 01/28/2023 1123   BILIRUBINUR NEGATIVE 01/28/2023 1123   BILIRUBINUR negative 06/26/2019 1307   KETONESUR NEGATIVE 01/28/2023 1123   PROTEINUR 30 (A) 01/28/2023 1123   UROBILINOGEN 0.2 06/26/2019 1307   UROBILINOGEN 0.2 08/10/2013 1611   NITRITE POSITIVE (A) 01/28/2023 1123   LEUKOCYTESUR MODERATE (A) 01/28/2023 1123    Radiological Exams on Admission: CT Head Wo  Contrast  Result Date: 01/28/2023 CLINICAL DATA:  Head trauma, minor. EXAM: CT HEAD WITHOUT CONTRAST CT CERVICAL SPINE WITHOUT CONTRAST TECHNIQUE: Multidetector CT imaging of the head and cervical spine was performed following the standard protocol without intravenous contrast. Multiplanar CT image reconstructions of the cervical spine were also generated. RADIATION DOSE REDUCTION: This exam was performed according to the departmental dose-optimization program which includes automated exposure control, adjustment of the mA and/or kV according to patient size and/or use of iterative reconstruction technique. COMPARISON:  09/30/2021 FINDINGS: CT HEAD FINDINGS Brain: No evidence of acute infarction, hemorrhage, hydrocephalus, extra-axial collection or mass lesion/mass effect. Prominence of the sulci and ventricles. There is mild diffuse low-attenuation within the subcortical and periventricular white matter compatible with chronic microvascular disease. Vascular: No hyperdense vessel or unexpected calcification. Skull: Normal. Negative for fracture or focal lesion. Sinuses/Orbits: None Other: . CT CERVICAL SPINE FINDINGS Alignment: No acute, posttraumatic malalignment.Unchanged chronic anterolisthesis of C2 on C3 which measures 4.8 cm on today's study, image 37/9. Previously this measured the same. Anterolisthesis of C7 on T1 measures 3.8 cm, also unchanged from previous exam Skull base and vertebrae: No acute fracture. No primary bone lesion or focal pathologic process. Soft tissues and spinal canal: No prevertebral fluid or swelling. No visible canal hematoma. Disc levels: Prior C3 through C7 ACDF status post C6 and C7 hardware removal. 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 Prior left laminectomy at C7. Severe left uncovertebral hypertrophy at C2-3. Upper chest: Negative. Other: None IMPRESSION: 1. No acute intracranial abnormalities. 2. Chronic microvascular disease and cerebral atrophy. 3. No evidence for  cervical spine fracture or subluxation. 4. Prior C3 through C7 ACDF status post C6 and C7 hardware removal. 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 5. Unchanged chronic anterolisthesis of C2 on C3 and C7 on T1. Electronically Signed   By: Signa Kell M.D.   On: 01/28/2023 13:12   CT Cervical Spine Wo Contrast  Result Date: 01/28/2023 CLINICAL DATA:  Head trauma, minor. EXAM: CT HEAD WITHOUT CONTRAST CT CERVICAL SPINE WITHOUT CONTRAST TECHNIQUE: Multidetector CT imaging of the head and cervical spine was performed following the standard protocol without intravenous contrast. Multiplanar CT image reconstructions of the cervical spine were also  generated. RADIATION DOSE REDUCTION: This exam was performed according to the departmental dose-optimization program which includes automated exposure control, adjustment of the mA and/or kV according to patient size and/or use of iterative reconstruction technique. COMPARISON:  09/30/2021 FINDINGS: CT HEAD FINDINGS Brain: No evidence of acute infarction, hemorrhage, hydrocephalus, extra-axial collection or mass lesion/mass effect. Prominence of the sulci and ventricles. There is mild diffuse low-attenuation within the subcortical and periventricular white matter compatible with chronic microvascular disease. Vascular: No hyperdense vessel or unexpected calcification. Skull: Normal. Negative for fracture or focal lesion. Sinuses/Orbits: None Other: . CT CERVICAL SPINE FINDINGS Alignment: No acute, posttraumatic malalignment.Unchanged chronic anterolisthesis of C2 on C3 which measures 4.8 cm on today's study, image 37/9. Previously this measured the same. Anterolisthesis of C7 on T1 measures 3.8 cm, also unchanged from previous exam Skull base and vertebrae: No acute fracture. No primary bone lesion or focal pathologic process. Soft tissues and spinal canal: No prevertebral fluid or swelling. No visible canal hematoma. Disc levels: Prior C3 through C7 ACDF status post  C6 and C7 hardware removal. 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 Prior left laminectomy at C7. Severe left uncovertebral hypertrophy at C2-3. Upper chest: Negative. Other: None IMPRESSION: 1. No acute intracranial abnormalities. 2. Chronic microvascular disease and cerebral atrophy. 3. No evidence for cervical spine fracture or subluxation. 4. Prior C3 through C7 ACDF status post C6 and C7 hardware removal. 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 5. Unchanged chronic anterolisthesis of C2 on C3 and C7 on T1. Electronically Signed   By: Signa Kell M.D.   On: 01/28/2023 13:12   DG Chest Port 1 View  Result Date: 01/28/2023 CLINICAL DATA:  [facility]-DGQuestionable sepsis - evaluate for abnormality fall. Found on floor. EXAM: PORTABLE CHEST 1 VIEW COMPARISON:  01/12/2023 FINDINGS: Normal mediastinum and cardiac silhouette. Normal pulmonary vasculature. No evidence of effusion, infiltrate, or pneumothorax. No acute bony abnormality. IMPRESSION: No acute cardiopulmonary process. Electronically Signed   By: Genevive Bi M.D.   On: 01/28/2023 12:20   DG Hip Unilat W or Wo Pelvis 2-3 Views Right  Result Date: 01/28/2023 CLINICAL DATA:  72 year old female with history of trauma from a fall. Right hip pain. EXAM: DG HIP (WITH OR WITHOUT PELVIS) 2-3V RIGHT COMPARISON:  No priors. FINDINGS: Three views of the bony pelvis in the right hip demonstrate no acute displaced fracture of the bony pelvic ring. There is a minimally displaced mildly impacted transcervical right femoral neck fracture. Femoral head remains located. Visualized portions of the left proximal femur appear intact. There is joint space narrowing, subchondral sclerosis, subchondral cyst formation and osteophyte formation in the hip joints bilaterally, indicative of osteoarthritis. IMPRESSION: 1. Minimally displaced likely mildly impacted transcervical right femoral neck fracture, as above. Electronically Signed   By: Trudie Reed M.D.    On: 01/28/2023 12:20    EKG: Independently reviewed.  Sinus tachycardia at 131 bpm.  QTc prolonged significantly at 635.  Rate faster from previous.  Assessment/Plan Principal Problem:   Fracture of femoral neck, right, closed (HCC) Active Problems:   Memory difficulties   Hyperlipidemia   COPD (chronic obstructive pulmonary disease) (HCC)   UTI (urinary tract infection)   Bipolar 1 disorder (HCC)   Hypertension   Stage 2 chronic kidney disease   History of TIA (transient ischemic attack)   Hyponatremia   Elevated CK   Acute renal failure superimposed on stage 2 chronic kidney disease (HCC)   Right femoral neck fracture Elevated CK > Patient had a fall  after losing balance at home.  Landed on her right side and due to pain was unable to get up.  Was found this morning and brought to the ED. > Imaging showing minimally displaced and likely mildly impacted transcervical right femoral neck fracture. > Mildly elevated CK at 2 1118 after being down on the ground overnight. > Orthopedic consulted in the ED. - Appreciate orthopedics recommendations and assistance - N.p.o. for now pending orthopedics recommendations - As needed pain control with Tylenol for mild pain, Norco for moderate to severe pain, Dilaudid for breakthrough pain - Consult to anesthesia for nerve block - IV fluids - Recheck CK in a.m. - Supportive care  UTI > Patient has some fevers and chills recently which she thought could be related to recently treated pneumonia. > However, evidence of UTI on urinalysis with nitrates, leukocytes, bacteria.  Urine culture and blood culture pending.  Leukocytosis to 12.5 though there may be a reactive component considering fall. 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  Aztreonam started in the ED. - Continue with aztreonam per pharmacy, narrow when able - Trend fever curve and WBC - Follow-up urine culture and blood cultures  AKI on CKD  2 Hyponatremia Hypokalemia > Creatinine elevated 1.58 from baseline of 1.  Likely dehydration is contributing to her symptoms and her tachycardia. > Sodium 127, potassium 3.3 > Received 1 L IV fluid in the ED with some improvement in heart rate. - Monitor on telemetry overnight - Will continue with IV fluids overnight and trend sodium though only mild to moderately low - 20 mill equivalents IV potassium - Check magnesium - Trend renal function and electrolytes  Tachycardia QTc prolongation > Heart rates as high as the 130s in the ED with prolonged QTc greater than 600. > Some improvement in tachycardia with IV fluids and pain control. - Continue with IV fluids as above - Continue with pain control as above - Avoid QTc prolonging medications - A.m. EKG  Hypertension - Holding losartan and hydrochlorothiazide in the setting of AKI - Continue home metoprolol  Hyperlipidemia - Continue home atorvastatin  COPD - Replace home Stiolto with formulary Brovana and Incruse - Continue home albuterol and Singulair  History of TIA - Continue home ASA, atorvastatin  GERD - Continue home PPI  Bipolar disorder Anxiety - Continue home Wellbutrin, Celexa, Lamictal, as needed Xanax, as needed Ambien  DVT prophylaxis: SCDs Code Status:   Full Family Communication:  None on admission  Disposition Plan:   Patient is from:  Home  Anticipated DC to:  Home versus rehab  Anticipated DC date:  2 to 4 days  Anticipated DC barriers: None  Consults called:  Orthopedic surgery consulted in the ED Admission status:  Inpatient, telemetry  Severity of Illness: The appropriate patient status for this patient is INPATIENT. Inpatient status is judged to be reasonable and necessary in order to provide the required intensity of service to ensure the patient's safety. The patient's presenting symptoms, physical exam findings, and initial radiographic and laboratory data in the context of their chronic  comorbidities is felt to place them at high risk for further clinical deterioration. Furthermore, it is not anticipated that the patient will be medically stable for discharge from the hospital within 2 midnights of admission.   * I certify that at the point of admission it is my clinical judgment that the patient will require inpatient hospital care spanning beyond 2 midnights from the point of admission due to high  intensity of service, high risk for further deterioration and high frequency of surveillance required.Synetta Fail MD [facility]  How to contact the [facility] Attending or Consulting provider 7A - 7P or covering provider during after hours 7P -7A, for this patient?   Check the care team in [facility] and look for a) attending/consulting [facility] provider listed and b) the [facility] team listed Log into [email or web address] and use [facility] universal password to access. If you do not have the password, please contact the hospital operator. Locate the [facility] provider you are looking for under [facility] and page to a number that you can be directly reached. If you still have difficulty reaching the provider, please page the [facility] (Director on Call) for the Hospitalists listed on amion for assistance.  01/28/2023, 2:28 PM

## 2023-01-28 NOTE — Progress Notes (Signed)
Pharmacy Antibiotic Note  Erin Lopez is a 72 y.o. female for which pharmacy has been consulted for aztreonam dosing for UTI. Aztreonam selected in light of patient's allergies.  Estimated Creatinine Clearance: 35.8 mL/min (A) (by C-G formula based on SCr of 1.58 mg/dL (H)). WBC 12.5; LA 2.1; T 98.6; HR 110; RR 24 COVID neg / flu neg  Plan: Aztreonam 1g q8h Monitor cultures to further guide abx selection Monitor WBC, fever, renal function De-escalate when able  Height: 5\' 7"  (170.2 cm) Weight: 78.5 kg (173 lb) IBW/kg (Calculated) : 61.6  Temp (24hrs), Avg:98.5 F (36.9 C), Min:98.4 F (36.9 C), Max:98.6 F (37 C)  Recent Labs  Lab 01/28/23 1050 01/28/23 1146 01/28/23 1327  WBC 12.5*  --   --   CREATININE 1.58*  --   --   LATICACIDVEN  --  2.2* 2.1*    Estimated Creatinine Clearance: 35.8 mL/min (A) (by C-G formula based on SCr of 1.58 mg/dL (H)).    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  Delmar Landau, PharmD, BCPS 01/28/2023 2:48 PM ED Clinical Pharmacist -  [phone]

## 2023-01-28 NOTE — Transfer of Care (Signed)
Immediate Anesthesia Transfer of Care Note  Patient: Erin Lopez  Procedure(s) Performed: PERCUTANEOUS FIXATION OF RIGHT FEMORAL NECK (Right: Hip)  Patient Location: PACU  Anesthesia Type:General  Level of Consciousness: drowsy  Airway & Oxygen Therapy: Patient Spontanous Breathing and Patient connected to face mask oxygen  Post-op Assessment: Report given to RN and Post -op Vital signs reviewed and stable  Post vital signs: Reviewed and stable  Last Vitals:  Vitals Value Taken Time  BP 105/53 01/28/23 2130  Temp 36.6 C 01/28/23 2130  Pulse 89 01/28/23 2145  Resp 15 01/28/23 2145  SpO2 96 % 01/28/23 2145  Vitals shown include unfiled device data.  Last Pain:  Vitals:   01/28/23 2130  TempSrc:   PainSc: Asleep         Complications: No notable events documented.

## 2023-01-28 NOTE — Discharge Instructions (Signed)
Orthopedic surgery discharge instructions:  -Okay for full weightbearing as tolerated to right lower extremity.  -Apply ice liberally to the right hip throughout the day.  20 to 30 minutes out of each hour.  -For breakthrough pain use Norco as directed.  For mild to moderate pain use Tylenol and Advil around-the-clock.  -You may maintain your postoperative bandage until your follow-up appointment.  You may begin showering on postoperative day #2.  Do not submerge underwater.  -For the prevention of blood clots in your lower extremities you should take an 81 mg aspirin twice per day x 6 weeks.  -Follow-up with Dr. Aundria Rud in 2 weeks for wound check and staple removal.

## 2023-01-28 NOTE — Progress Notes (Signed)
MEWS Progress Note  Patient Details Name: NALIYAH FARIS MRN: [id number] DOB: 21-Apr-1950 Today's Date: 01/28/2023   MEWS Flowsheet Documentation:  Assess: MEWS Score Temp: (!) 101.7 F (38.7 C) BP: (!) 153/70 MAP (mmHg): 95 Pulse Rate: (!) 118 ECG Heart Rate: (!) 112 Resp: (!) 24 SpO2: 92 % O2 Device: Room Air Patient Activity (if Appropriate): In bed Assess: MEWS Score MEWS Temp: 2 MEWS Systolic: 0 MEWS Pulse: 2 MEWS RR: 1 MEWS LOC: 0 MEWS Score: 5 MEWS Score Color: Red Assess: SIRS CRITERIA SIRS Temperature : 1 SIRS Respirations : 1 SIRS Pulse: 1 SIRS WBC: 0 SIRS Score Sum : 3 Assess: if the MEWS score is Yellow or Red Were vital signs accurate and taken at a resting state?: Yes Does the patient meet 2 or more of the SIRS criteria?: Yes Does the patient have a confirmed or suspected source of infection?: Yes MEWS guidelines implemented : Yes, red Treat MEWS Interventions: Considered administering scheduled or prn medications/treatments as ordered Take Vital Signs Increase Vital Sign Frequency : Red: Q1hr x2, continue Q4hrs until patient remains green for 12hrs Escalate MEWS: Escalate: Red: Discuss with charge nurse and notify provider. Consider notifying RRT. If remains red for 2 hours consider need for higher level of care Notify: Charge Nurse/RN Name of Charge Nurse/RN Notified: Daneil Dan, RN Provider Notification Provider Name/Title: Beola Cord MD Date Provider Notified: 01/28/23 Time Provider Notified: 1811 Method of Notification:  (Secure Chat) Notification Reason: Other (Comment) ([place] Protocol) Provider response: Other (Comment) (Awaiting response) Notify: Rapid Response Name of Rapid Response RN Notified: [place], RN Date Rapid Response Notified: 01/28/23 Time Rapid Response Notified: 1812      Katai Marsico K Tony Friscia 01/28/2023, 6:12 PM

## 2023-01-28 NOTE — Op Note (Signed)
Date of Surgery: 01/28/2023  INDICATIONS: Ms. Melesio is a 72 y.o.-year-old female who sustained a hip fracture; she was indicated for open reduction and internal fixation due to the displaced nature of the fracture and came to the operating room today for this procedure. The patient did consent to the procedure after discussion of the risks and benefits.   PREOPERATIVE DIAGNOSIS: right valgus impacted femoral neck fracture with no shortening   POSTOPERATIVE DIAGNOSIS: Same.  PROCEDURE: Open treatment of proximal end of femur, neck with internal fixation. CPT [phone].  SURGEON: Duwayne Heck, MD  ASSIST: Dion Saucier, PA-C  Assistant attestation:  PA Sharon Seller present for the entire procedure.,  ANESTHESIA: general  IV FLUIDS AND URINE: See anesthesia.  ESTIMATED BLOOD LOSS: 10 mL.  IMPLANTS:  Synthes 7.3 mm cannulated screws x 3  DRAINS: None.  COMPLICATIONS: None.  DESCRIPTION OF PROCEDURE: The patient was brought to the operating room and placed supine on the operating table.  The patient had been signed prior to the procedure and this was documented. The patient had the anesthesia placed by the anesthesiologist.  The prep verification and incision time-outs were performed to confirm that this was the correct patient, site, side and location. The patient had SCDs in place. The patient did receive antibiotics prior to the incision and was redosed during the procedure as needed at indicated intervals.  The patient's well leg was placed in a flexed lithotomy position and carefully padded.  The operative leg was placed in traction and also well-padded.  Care was taken to confirm radiographs before prepping and draping. The hip was prepped and draped in the standard fashion.  Screws were placed using the following technique.  The guidepin was first placed and confirmed to be in the proper location on both AP and lateral views. After the guidewire was placed, the skin incision was made over  the guidewire, then the 4.5 mm cannulated drill was started over the wire.  The final cannulated screw guidewire was then placed and again confirmed in position on both views.  The measuring stick was used to measure the length of screws.  This was repeated for all screws.  The approach-withdraw phenomenon was visualized under live fluoroscopy to confirm that all screws were of the appropriate length and in the head.   Of note, during the orthopedic procedure, the bone quality was found to be very poor.  All wounds were copiously irrigated with saline and then the skin was reapproximated with staples. The wounds were cleaned and dried a final time and a sterile dressing. The patient was then transferred back to the bed and left the operating room in stable condition.  All sponge and instrument counts were correct.  POSTOPERATIVE PLAN: Ms. Lampkin will be weight bearing as tolerated; she will return for staple removal in 2 weeks. Ms. Sato will receive DVT prophylaxis based on other medications, activity level, and risk ratio of bleeding to thrombosis per the primary team; if possible, we would prefer twice daily aspirin x 6 weeks.

## 2023-01-28 NOTE — ED Notes (Signed)
ED phlebotomy stuck pt x3. Labs were unable to be pulled.

## 2023-01-28 NOTE — Progress Notes (Signed)
Patient transferred back to 6N 26 via hospital bed; met at room by primary nurse Percell Miller. Patient awake answering questions appropriately, denied pain or distress and was appreciative of care provided. Percell Miller, RN appreciative of report and denied need of further assistance at bedside.

## 2023-01-28 NOTE — ED Triage Notes (Signed)
Patient presents via EMS from home for fall last night at approximately 2200 and was on the floor for approximately 12 hours. Per EMS, patient lives at home alone. Per EMS, patient had sudden-onset of dizziness which caused the fall and patient has been dizzy since then. Per EMS, patient had a head strike on a doorframe; unsure of LOC; patient denies taking blood thinners. Patient endorses right leg pain and neck pain. Per EMS, patient refused c-collar.  Patient reports shortness of breath for three weeks. Patient states that three weeks ago she was diagnosed with pneumonia and prescribed antibiotics for ten days. Patient states that she followed up with her doctor last week and that her doctor stated that she "still had some" but per patient she was not given any additional medication at that time. Per EMS, upon their arrival, patient had audible wheezing and was hypoxic at 89% on room air.

## 2023-01-28 NOTE — ED Notes (Signed)
ED TO INPATIENT HANDOFF REPORT  ED Nurse Name and Phone #: Vernona Rieger [phone]  S Name/Age/Gender Erin Lopez 72 y.o. female Room/Bed: 042C/042C  Code Status   Code Status: Full Code  Home/SNF/Other Rehab Patient oriented to: self, place, time, and situation Is this baseline? Yes   Triage Complete: Triage complete  Chief Complaint Fracture of femoral neck, right, closed (HCC) [S72.001A]  Triage Note Patient presents via EMS from home for fall last night at approximately 2200 and was on the floor for approximately 12 hours. Per EMS, patient lives at home alone. Per EMS, patient had sudden-onset of dizziness which caused the fall and patient has been dizzy since then. Per EMS, patient had a head strike on a doorframe; unsure of LOC; patient denies taking blood thinners. Patient endorses right leg pain and neck pain. Per EMS, patient refused c-collar.  Patient reports shortness of breath for three weeks. Patient states that three weeks ago she was diagnosed with pneumonia and prescribed antibiotics for ten days. Patient states that she followed up with her doctor last week and that her doctor stated that she "still had some" but per patient she was not given any additional medication at that time. Per EMS, upon their arrival, patient had audible wheezing and was hypoxic at 89% on room air.    Allergies Allergies  Allergen Reactions   Ceftin [Cefuroxime Axetil] Anaphylaxis and Swelling    Swelling of eyes and tongue   Cipro [Ciprofloxacin Hcl] Anaphylaxis and Swelling   Risperdal [Risperidone] Palpitations    Generalized weakness Malaise  Hypertension   Fosamax [Alendronate Sodium] Diarrhea   Linzess [Linaclotide] Diarrhea   Morphine And Codeine Other (See Comments)    Hallucinations Disorientation    Prednisone Other (See Comments)    Hyperactivity   Roxicodone [Oxycodone] Other (See Comments)    Previously addicted to medication   Seroquel [Quetiapine] Other (See Comments)     Oversedation     Level of Care/Admitting Diagnosis ED Disposition     ED Disposition  Admit   Condition  --   Comment  Hospital Area: [facility] [[id number]]  Level of Care: Telemetry Medical [[id number]]  May admit patient to [facility] or [facility] if equivalent level of care is available:: No  Covid Evaluation: Asymptomatic - no recent exposure (last 10 days) testing not required  Diagnosis: Fracture of femoral neck, right, closed [facility]) [[id number]]  Admitting Physician: Synetta Fail [[id number]]  Attending Physician: Synetta Fail [[id number]]  Certification:: I certify this patient will need inpatient services for at least 2 midnights  Expected Medical Readiness: 01/30/2023          B Medical/Surgery History Past Medical History:  Diagnosis Date   Abnormal chest x-ray 06/30/2013   pulm vasc congest 05/2013     Acute encephalopathy 11/19/2017   Alcoholism in remission (HCC)    states last alcohol 11 yrs ago   Anemia    Anxiety    Bipolar 1 disorder (HCC)    Admission for mania x 2 (most recent 11/2017)   Cancer (HCC)    Cervical spondylosis without myelopathy 12/19/2013   MRI 02/2014: multilevel DDD/spondylosis, not much change compared to prior MRI.  Pt not in favor of invasive therapy for her neck as of 04/2014.   Chest pain 07/11/2018   Cholelithiasis    COPD (chronic obstructive pulmonary disease) (HCC)    Moderate: anoro started 04/2017 by pulm, pt symptomatically improved and PFTs stable at f/u 06/2017.  Coronary artery spasm (HCC) 07/11/2018   nitrates=HA. Amlodipine=intol swelling.  Changed to coreg 09/03/18 by cardiologist.   Depression    Dilutional hyponatremia    Endometritis 05/2017   possible; empiric tx with Flagyl by GYN.   Environmental allergies    Fibromyalgia    GERD (gastroesophageal reflux disease)    Hepatic steatosis 03/2019   u/s abd   Herpes zoster 08/07/2017   L scapular region   Hiatal hernia    History of  adenomatous polyp of colon 04/2008; 08/2014   No high grade dysplasia: recall 5 yrs (Dr. Bosie Clos, [facility])   History of basal cell carcinoma excision    NOSE   History of Helicobacter pylori infection 04/2008   +gastric biopsy (gastritis but no metaplasia, dysplasia, or malignancy identified)   History of kidney stones    History of TIA (transient ischemic attack)    secondary to HELLP syndrome 1994 post c/s--  residual memory loss   Hyperlipidemia    statin started after her NSTEMI: goal  LDL <70.   Hypertension    Infection of urinary tract 06/05/2015   Internal hemorrhoids    Ischemic cardiomyopathy 05/2018   Echo EF 45-50% in context of NSTEMI from CA spasm. 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 Charlsie Merles 04/2017.   Leg pain    ABIs 11/2019: normal    Memory loss    Abnl MRI brain and CT brain c/w chronic microvascular ischemia.  Repeat MRI brain 02/2014 stable (Dr. Radonna Ricker with no plan to f/u with neuro as of 04/2014)   NSTEMI (non-ST elevated myocardial infarction) (HCC) 05/2018   Echo EF 45-50% in context of NSTEMI from CA spasm.// Myoview 8/21: EF 66, no ischemia, low risk   Osteoarthritis of left wrist    STT and 1st [facility] jt   Osteoporosis 05/2010   2012 'penia; 06/2015 'porosis:  prolia.  DEXA 09/2017 T-score -2.2 femur neck. 11/2021 T score -2.2. Rpt 2 yrs   Pelvic floor dysfunction    [facility] (Dr. Marlou Porch)   Pneumonia    Postmenopausal vaginal bleeding 05/2017   x 1 small episode; GYN attempted endo bx but unable to penetrate cervix due to severe cerv stenosis (due to postmenopausal state + hx of LEEP).  Endo u/s showed thin endo lining.  Per GYN---no evidence of endomet pathology on w/u---obs as of 07/2017.   Pre-diabetes     Prediabetes 06/2016   Fasting gluc 105; HbA1c at that time was 6.0%.  A1c 6.2% 12/2017.  A1c 5.7% Feb 2023.   Recurrent kidney stones 08/2013   Left ureteral calculus: Perc nephr--cystoscopy w/ureteroscopy + laser for stone removal.  Residual asymptomatic left renal nephrolithiasis <22mm present post-procedure.  Right sided hydronephrosis-persistent (on u/s)--urol ordered CT to further eval 08/2016: 1.6 cm nonobstructing stone lower pole left kidney, no hydro, plan for PCN extraction [facility])   Renal calculus 08/05/2013   Shortness of breath    Sprain of neck 12/19/2013   Stroke [facility])    TIA   Suprapubic discomfort 06/05/2015   TMJ (temporomandibular joint disorder)    USES  MOUTH GUARD   Toe fracture, left 12/2017   2nd toe prox phalanx (immobilization, Dr. Luciana Axe)   Tubular adenoma of colon    Urinary frequency 08/01/2013   Vitamin D  deficiency 05/2011   Weak urinary stream 07/2016   with elevated PVR and mild left hydronephrosis secondary to this ([facility] started flomax 0.4mg  qd for this).   Past Surgical History:  Procedure Laterality Date   ABIs  11/29/2019   NORMAL   ANTERIOR CERVICAL DECOMP/DISCECTOMY FUSION  06/30/2009   C3 -- C5  AND EXPLORATION OF FUSION C5-7 W/  PLATE REMOVAL   ARTHRODESIS METATARSALPHALANGEAL JOINT (MTPJ) Left 07/08/2021   Procedure: Left hallux metatarsophalangeal joint arthrodesis;  Surgeon: Toni Arthurs, MD;  Location: [facility];  Service: Orthopedics;  Laterality: Left;   BACK SURGERY     BUNIONECTOMY Left    CARDIOVASCULAR STRESS TEST  12/03/2019   myoc perf im: NORMAL   CERVICAL FUSION  2002   anterior C5 -- C7   CERVICAL SPINE SURGERY  08/27/1999     C6 -- T1  LAMINECTOMY/  DISKECTOMY   CESAREAN SECTION  1994   CHOLECYSTECTOMY N/A 03/06/2020   Procedure: LAPAROSCOPIC CHOLECYSTECTOMY;  Surgeon: Quentin Ore, MD;  Location: [facility];  Service: General;  Laterality: N/A;   COLONOSCOPY W/ POLYPECTOMY  04/2008;08/2014    2016 tubular adenoma x 1; +diverticulosis and int/ext hemorrhoids.  06/2020 adenoma, recall 5 yrs.   CYSTOSCOPY WITH URETEROSCOPY AND STENT PLACEMENT Left 08/28/2013   Procedure: CYSTOSCOPY, RGP,  WITH URETEROSCOPY AND STENT REMOVAL;  Surgeon: Valetta Fuller, MD;  Location: [facility];  Service: Urology;  Laterality: Left;   DEXA  09/2017   T-score -2.2 femur neck: improved compared to 06/2015.  2021 DEXA T score -2.3.  11/2021 DEXA T score -2.2.   ESOPHAGOGASTRODUODENOSCOPY  2010; 08/2014   2016 +Candidal esophagitis; Mild chronic gastritis w/intestinal metaplasia--NEG H pylori, neg for eosinophilic esoph. 06/2020 esoph candidiasis (diflucan x 20d), o/w normal.   HAMMER TOE SURGERY Left 07/08/2021   Procedure: Second metatarsal head excision; revision Second hammertoe correction; 2-5 percutaneus flexor tenotomies;  Surgeon: Toni Arthurs, MD;  Location: [facility];  Service: Orthopedics;  Laterality: Left;   HARDWARE REMOVAL Left 07/08/2021   Procedure: Removal of deep implants from hallux proximal phalanx and First and Second metatarsals;  Surgeon: Toni Arthurs, MD;  Location: [facility];  Service: Orthopedics;  Laterality: Left;   HOLMIUM LASER APPLICATION Left 08/28/2013   Procedure: HOLMIUM LASER APPLICATION;  Surgeon: Valetta Fuller, MD;  Location: [facility];  Service: Urology;  Laterality: Left;   JOINT REPLACEMENT     LEFT HEART CATH AND CORONARY ANGIOGRAPHY N/A 05/15/2018   Evidence for spasm in distal LAD.  Mod RCA atherosclerosis, o/w no CAD.  Procedure: LEFT HEART CATH AND CORONARY ANGIOGRAPHY;  Surgeon: Marykay Lex, MD;  Location: [facility] INVASIVE CV LAB;  Service: Cardiovascular;  Laterality: N/A;   NEGATIVE SLEEP STUDY  04/01/2007   NEPHROLITHOTOMY Left 08/05/2013   Procedure: NEPHROLITHOTOMY PERCUTANEOUS;  Surgeon: Valetta Fuller, MD;  Location: [facility];  Service: Urology;  Laterality: Left;   POLYSOMNOGRAM  01/2019   NORMAL    POLYSOMNOGRAPHY  10/25/2018   Dr. Luetta Nutting due to pt inadequat sleep time (83 min).   TONSILLECTOMY AND ADENOIDECTOMY  1975   TOTAL KNEE ARTHROPLASTY Right 2003   TOTAL KNEE REVISION Right 01/19/2022   Procedure: Right knee polyethylene revision;  Surgeon: Ollen Gross, MD;  Location: [facility];  Service: Orthopedics;  Laterality: Right;   TRANSTHORACIC ECHOCARDIOGRAM  05/2018; 12/03/2019   (after MI secondary to arterial spasm): EF 45-50%; severe hypokinesis of apical anterolateral, inferolateral ,  anterior, and inferior LV myocardium. 11/2019 EF 55-60%, valves fine   ULTRASOUND EXAM,PELVIC COMPLETE ([facility] HX)  06/25/2015   NORMAL (done by GYN)     A IV Location/Drains/Wounds Patient Lines/Drains/Airways Status     Active Line/Drains/Airways     Name Placement date Placement time Site Days   Peripheral IV 01/28/23 20 G Left Antecubital 01/28/23  --  Antecubital  less than 1   Incision - 4 Ports Abdomen 1: Umbilicus 2: Mid;Upper 3: Medial;Mid 4: Right;Lateral 03/06/20  --  -- 1058            Intake/Output Last 24 hours  Intake/Output Summary (Last 24 hours) at 01/28/2023 1716 Last data filed at 01/28/2023 1238 Gross per 24 hour  Intake 1000 ml  Output --  Net 1000 ml    Labs/Imaging Results for orders placed or performed during the hospital encounter of 01/28/23 (from the past 48 hour(s))  Comprehensive metabolic panel     Status: Abnormal   Collection Time: 01/28/23 10:50 AM  Result Value Ref Range   Sodium 127 (L) 135 - 145 mmol/L   Potassium 3.3 (L) 3.5 - 5.1 mmol/L   Chloride 94 (L) 98 - 111 mmol/L   CO2 21 (L) 22 - 32 mmol/L   Glucose, Bld 140 (H) 70 - 99 mg/dL    Comment: Glucose reference range applies only to samples taken after fasting for at least 8 hours.   BUN 29 (H) 8 - 23 mg/dL   Creatinine, Ser 1.61 (H) 0.44 - 1.00 mg/dL   Calcium 8.4 (L) 8.9 - 10.3 mg/dL   Total Protein 6.0 (L) 6.5 - 8.1 g/dL   Albumin 3.4 (L) 3.5 - 5.0 g/dL    AST 44 (H) 15 - 41 U/L   ALT 31 0 - 44 U/L   Alkaline Phosphatase 82 38 - 126 U/L   Total Bilirubin 1.4 (H) 0.3 - 1.2 mg/dL   GFR, Estimated 35 (L) >60 mL/min    Comment: (NOTE) Calculated using the CKD-EPI Creatinine Equation (2021)    Anion gap 12 5 - 15    Comment: Performed at [facility], [address], [place], [place] [address]  CBC with Differential     Status: Abnormal   Collection Time: 01/28/23 10:50 AM  Result Value Ref Range   WBC 12.5 (H) 4.0 - 10.5 K/uL   RBC 3.35 (L) 3.87 - 5.11 MIL/uL   Hemoglobin 9.9 (L) 12.0 - 15.0 g/dL   HCT 54.0 (L) 98.1 - 19.1 %   MCV 89.0 80.0 - 100.0 fL   MCH 29.6 26.0 - 34.0 pg   MCHC 33.2 30.0 - 36.0 g/dL   RDW 47.8 29.5 - 62.1 %   Platelets 200 150 - 400 K/uL   nRBC 0.0 0.0 - 0.2 %   Neutrophils Relative % 87 %   Neutro Abs 10.9 (H) 1.7 - 7.7 K/uL   Lymphocytes Relative 3 %   Lymphs Abs 0.4 (L) 0.7 - 4.0 K/uL   Monocytes Relative 9 %   Monocytes Absolute 1.1 (H) 0.1 - 1.0 K/uL   Eosinophils Relative 0 %   Eosinophils Absolute 0.0 0.0 - 0.5 K/uL   Basophils Relative 0 %   Basophils Absolute 0.0 0.0 - 0.1 K/uL   Immature Granulocytes 1 %   Abs Immature Granulocytes 0.11 (H) 0.00 - 0.07 K/uL    Comment: Performed at [facility], [address] [place], [place] [address]  Protime-INR     Status:  None   Collection Time: 01/28/23 10:50 AM  Result Value Ref Range   Prothrombin Time 14.9 11.4 - 15.2 seconds   INR 1.1 0.8 - 1.2    Comment: (NOTE) INR goal varies based on device and disease states. Performed at [facility], [address] [place], [place] [address]   APTT     Status: None   Collection Time: 01/28/23 10:50 AM  Result Value Ref Range   aPTT 31 24 - 36 seconds    Comment: Performed at [facility], [address] [place], [place] [address]  CK     Status: Abnormal   Collection Time: 01/28/23 10:50 AM  Result Value Ref Range   Total CK 1,118 (H) 38 - 234 U/L    Comment: Performed at  [facility], [address], [place], [place] [address]  Resp panel by RT-PCR (RSV, Flu A&B, Covid) Anterior Nasal Swab     Status: None   Collection Time: 01/28/23 11:14 AM   Specimen: Anterior Nasal Swab  Result Value Ref Range   SARS Coronavirus 2 by RT PCR NEGATIVE NEGATIVE   Influenza A by PCR NEGATIVE NEGATIVE   Influenza B by PCR NEGATIVE NEGATIVE    Comment: (NOTE) The Xpert Xpress SARS-CoV-2/FLU/RSV plus assay is intended as an aid in the diagnosis of influenza from Nasopharyngeal swab specimens and should not be used as a sole basis for treatment. Nasal washings and aspirates are unacceptable for Xpert Xpress SARS-CoV-2/FLU/RSV testing.  Fact Sheet for Patients: BloggerCourse.com  Fact Sheet for Healthcare Providers: SeriousBroker.it  This test is not yet approved or cleared by the [place] FDA and has been authorized for detection and/or diagnosis of SARS-CoV-2 by FDA under an Emergency Use Authorization (EUA). This EUA will remain in effect (meaning this test can be used) for the duration of the COVID-19 declaration under Section 564(b)(1) of the Act, 21 U.S.C. section 360bbb-3(b)(1), unless the authorization is terminated or revoked.     Resp Syncytial Virus by PCR NEGATIVE NEGATIVE    Comment: (NOTE) Fact Sheet for Patients: BloggerCourse.com  Fact Sheet for Healthcare Providers: SeriousBroker.it  This test is not yet approved or cleared by the [place] FDA and has been authorized for detection and/or diagnosis of SARS-CoV-2 by FDA under an Emergency Use Authorization (EUA). This EUA will remain in effect (meaning this test can be used) for the duration of the COVID-19 declaration under Section 564(b)(1) of the Act, 21 U.S.C. section 360bbb-3(b)(1), unless the authorization is terminated or revoked.  Performed at [facility], [address] [place], [place] [address]   Urinalysis, w/ Reflex to Culture (Infection Suspected) -Urine, Clean Catch     Status: Abnormal   Collection Time: 01/28/23 11:23 AM  Result Value Ref Range   Specimen Source URINE, CLEAN CATCH    Color, Urine YELLOW YELLOW   APPearance CLEAR CLEAR   Specific Gravity, Urine 1.020 1.005 - 1.030   pH 5.5 5.0 - 8.0   Glucose, UA NEGATIVE NEGATIVE mg/dL   Hgb urine dipstick MODERATE (A) NEGATIVE   Bilirubin Urine NEGATIVE NEGATIVE   Ketones, ur NEGATIVE NEGATIVE mg/dL   Protein, ur 30 (A) NEGATIVE mg/dL   Nitrite POSITIVE (A) NEGATIVE   Leukocytes,Ua MODERATE (A) NEGATIVE   Squamous Epithelial / HPF 0-5 0 - 5 /HPF   WBC, UA 11-20 0 - 5 WBC/hpf    Comment: Reflex urine culture not performed if WBC <=10, OR if Squamous epithelial  cells >5. If Squamous epithelial cells >5, suggest recollection.   RBC / HPF 6-10 0 - 5 RBC/hpf   Bacteria, UA FEW (A) NONE SEEN   Mucus PRESENT     Comment: Performed at [facility], [address] [place], [place] [address]  I-Stat Lactic Acid, ED     Status: Abnormal   Collection Time: 01/28/23 11:46 AM  Result Value Ref Range   Lactic Acid, Venous 2.2 (HH) 0.5 - 1.9 mmol/L   Comment NOTIFIED PHYSICIAN   I-Stat Lactic Acid, ED     Status: Abnormal   Collection Time: 01/28/23  1:27 PM  Result Value Ref Range   Lactic Acid, Venous 2.1 (HH) 0.5 - 1.9 mmol/L   Comment NOTIFIED PHYSICIAN    *Note: Due to a large number of results and/or encounters for the requested time period, some results have not been displayed. A complete set of results can be found in Results Review.   CT Head Wo Contrast  Result Date: 01/28/2023 CLINICAL DATA:  Head trauma, minor. EXAM: CT HEAD WITHOUT CONTRAST CT CERVICAL SPINE WITHOUT CONTRAST TECHNIQUE: Multidetector CT imaging of the head and cervical spine was performed following the standard protocol without intravenous contrast. Multiplanar CT image reconstructions of the cervical spine  were also generated. RADIATION DOSE REDUCTION: This exam was performed according to the departmental dose-optimization program which includes automated exposure control, adjustment of the mA and/or kV according to patient size and/or use of iterative reconstruction technique. COMPARISON:  09/30/2021 FINDINGS: CT HEAD FINDINGS Brain: No evidence of acute infarction, hemorrhage, hydrocephalus, extra-axial collection or mass lesion/mass effect. Prominence of the sulci and ventricles. There is mild diffuse low-attenuation within the subcortical and periventricular white matter compatible with chronic microvascular disease. Vascular: No hyperdense vessel or unexpected calcification. Skull: Normal. Negative for fracture or focal lesion. Sinuses/Orbits: None Other: . CT CERVICAL SPINE FINDINGS Alignment: No acute, posttraumatic malalignment.Unchanged chronic anterolisthesis of C2 on C3 which measures 4.8 cm on today's study, image 37/9. Previously this measured the same. Anterolisthesis of C7 on T1 measures 3.8 cm, also unchanged from previous exam Skull base and vertebrae: No acute fracture. No primary bone lesion or focal pathologic process. Soft tissues and spinal canal: No prevertebral fluid or swelling. No visible canal hematoma. Disc levels: Prior C3 through C7 ACDF status post C6 and C7 hardware removal. 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 Prior left laminectomy at C7. Severe left uncovertebral hypertrophy at C2-3. Upper chest: Negative. Other: None IMPRESSION: 1. No acute intracranial abnormalities. 2. Chronic microvascular disease and cerebral atrophy. 3. No evidence for cervical spine fracture or subluxation. 4. Prior C3 through C7 ACDF status post C6 and C7 hardware removal. 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 5. Unchanged chronic anterolisthesis of C2 on C3 and C7 on T1. Electronically Signed   By: Signa Kell M.D.   On: 01/28/2023 13:12   CT Cervical Spine Wo Contrast  Result Date:  01/28/2023 CLINICAL DATA:  Head trauma, minor. EXAM: CT HEAD WITHOUT CONTRAST CT CERVICAL SPINE WITHOUT CONTRAST TECHNIQUE: Multidetector CT imaging of the head and cervical spine was performed following the standard protocol without intravenous contrast. Multiplanar CT image reconstructions of the cervical spine were also generated. RADIATION DOSE REDUCTION: This exam was performed according to the departmental dose-optimization program which includes automated exposure control, adjustment of the mA and/or kV according to patient size and/or use of iterative reconstruction technique. COMPARISON:  09/30/2021 FINDINGS: CT HEAD FINDINGS Brain: No evidence of acute infarction, hemorrhage, hydrocephalus, extra-axial  collection or mass lesion/mass effect. Prominence of the sulci and ventricles. There is mild diffuse low-attenuation within the subcortical and periventricular white matter compatible with chronic microvascular disease. Vascular: No hyperdense vessel or unexpected calcification. Skull: Normal. Negative for fracture or focal lesion. Sinuses/Orbits: None Other: . CT CERVICAL SPINE FINDINGS Alignment: No acute, posttraumatic malalignment.Unchanged chronic anterolisthesis of C2 on C3 which measures 4.8 cm on today's study, image 37/9. Previously this measured the same. Anterolisthesis of C7 on T1 measures 3.8 cm, also unchanged from previous exam Skull base and vertebrae: No acute fracture. No primary bone lesion or focal pathologic process. Soft tissues and spinal canal: No prevertebral fluid or swelling. No visible canal hematoma. Disc levels: Prior C3 through C7 ACDF status post C6 and C7 hardware removal. 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 Prior left laminectomy at C7. Severe left uncovertebral hypertrophy at C2-3. Upper chest: Negative. Other: None IMPRESSION: 1. No acute intracranial abnormalities. 2. Chronic microvascular disease and cerebral atrophy. 3. No evidence for cervical spine fracture  or subluxation. 4. Prior C3 through C7 ACDF status post C6 and C7 hardware removal. 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 5. Unchanged chronic anterolisthesis of C2 on C3 and C7 on T1. Electronically Signed   By: Signa Kell M.D.   On: 01/28/2023 13:12   DG Chest Port 1 View  Result Date: 01/28/2023 CLINICAL DATA:  [facility]-DGQuestionable sepsis - evaluate for abnormality fall. Found on floor. EXAM: PORTABLE CHEST 1 VIEW COMPARISON:  01/12/2023 FINDINGS: Normal mediastinum and cardiac silhouette. Normal pulmonary vasculature. No evidence of effusion, infiltrate, or pneumothorax. No acute bony abnormality. IMPRESSION: No acute cardiopulmonary process. Electronically Signed   By: Genevive Bi M.D.   On: 01/28/2023 12:20   DG Hip Unilat W or Wo Pelvis 2-3 Views Right  Result Date: 01/28/2023 CLINICAL DATA:  71 year old female with history of trauma from a fall. Right hip pain. EXAM: DG HIP (WITH OR WITHOUT PELVIS) 2-3V RIGHT COMPARISON:  No priors. FINDINGS: Three views of the bony pelvis in the right hip demonstrate no acute displaced fracture of the bony pelvic ring. There is a minimally displaced mildly impacted transcervical right femoral neck fracture. Femoral head remains located. Visualized portions of the left proximal femur appear intact. There is joint space narrowing, subchondral sclerosis, subchondral cyst formation and osteophyte formation in the hip joints bilaterally, indicative of osteoarthritis. IMPRESSION: 1. Minimally displaced likely mildly impacted transcervical right femoral neck fracture, as above. Electronically Signed   By: Trudie Reed M.D.   On: 01/28/2023 12:20    Pending Labs Unresulted Labs (From admission, onward)     Start     Ordered   01/29/23 0500  CBC  Tomorrow morning,   R        01/28/23 1427   01/29/23 0500  Basic metabolic panel  Tomorrow morning,   R        01/28/23 1427   01/29/23 0500  Type and screen [facility]  Once,   R        Comments: [facility]    01/28/23 1427   01/29/23 0500  CK  Tomorrow morning,   R        01/28/23 1435   01/28/23 1500  Basic metabolic panel  Once,   R        01/28/23 1427   01/28/23 1440  Magnesium  Add-on,   AD        01/28/23 1439   01/28/23 1409  HIV Antibody (  routine testing w rflx)  (HIV Antibody (Routine testing w reflex) panel)  Once,   R        01/28/23 1427   01/28/23 1123  Urine Culture  Once,   R        01/28/23 1123   01/28/23 1113  Blood Culture (routine x 2)  (Undifferentiated presentation (screening labs and basic nursing orders))  BLOOD CULTURE X 2,   STAT      01/28/23 1113            Vitals/Pain Today's Vitals   01/28/23 1230 01/28/23 1300 01/28/23 1635 01/28/23 1714  BP: (!) 116/52 (!) 120/55    Pulse: (!) 114 (!) 110    Resp: (!) 24 (!) 24    Temp:    (!) 103 F (39.4 C)  TempSrc:    Oral  SpO2: 97% 98%    Weight:      Height:      PainSc:   8      Isolation Precautions No active isolations  Medications Medications  aspirin EC tablet 81 mg (has no administration in time range)  metoprolol succinate (TOPROL-XL) 24 hr tablet 100 mg (has no administration in time range)  buPROPion (WELLBUTRIN XL) 24 hr tablet 300 mg (has no administration in time range)  citalopram (CELEXA) tablet 20 mg (has no administration in time range)  lamoTRIgine (LAMICTAL) tablet 300 mg (has no administration in time range)  zolpidem (AMBIEN) tablet 5 mg (has no administration in time range)  ALPRAZolam (XANAX) tablet 0.5 mg (has no administration in time range)  pantoprazole (PROTONIX) EC tablet 40 mg (has no administration in time range)  arformoterol (BROVANA) nebulizer solution 15 mcg (has no administration in time range)    And  umeclidinium bromide (INCRUSE ELLIPTA) 62.5 MCG/ACT 1 puff (has no administration in time range)  montelukast (SINGULAIR) tablet 10 mg (10 mg Oral Given 01/28/23 1711)  albuterol (PROVENTIL) (2.5 MG/3ML) 0.083% nebulizer  solution 3 mL (has no administration in time range)  HYDROcodone-acetaminophen (NORCO/VICODIN) 5-325 MG per tablet 1-2 tablet (has no administration in time range)  HYDROmorphone (DILAUDID) injection 0.5 mg (0.5 mg Intravenous Given 01/28/23 1627)  polyethylene glycol (MIRALAX / GLYCOLAX) packet 17 g (has no administration in time range)  0.9 %  sodium chloride infusion ( Intravenous New Bag/Given 01/28/23 1557)  aztreonam (AZACTAM) 1 g in sodium chloride 0.9 % 100 mL IVPB (has no administration in time range)  lactated ringers bolus 1,000 mL (0 mLs Intravenous Stopped 01/28/23 1238)  aztreonam (AZACTAM) 1 g in sodium chloride 0.9 % 100 mL IVPB (0 g Intravenous Stopped 01/28/23 1539)  potassium chloride 10 mEq in 100 mL IVPB (0 mEq Intravenous Stopped 01/28/23 1713)    Mobility non-ambulatory     Focused Assessments Leg fx after fall   R Recommendations: See Admitting Provider Note  Report given to:   Additional Notes: Ortho at bedside saying they are going to take her tonight to OR.

## 2023-01-28 NOTE — Anesthesia Procedure Notes (Signed)
Procedure Name: Intubation Date/Time: 01/28/2023 8:21 PM  Performed by: Tressia Miners, CRNAPre-anesthesia Checklist: Patient identified, Emergency Drugs available, Suction available, Patient being monitored and Timeout performed Patient Re-evaluated:Patient Re-evaluated prior to induction Oxygen Delivery Method: Circle system utilized Preoxygenation: Pre-oxygenation with 100% oxygen Induction Type: IV induction Ventilation: Mask ventilation without difficulty Laryngoscope Size: Mac and 3 Grade View: Grade I Tube type: Oral Tube size: 7.0 mm Number of attempts: 1 Airway Equipment and Method: Stylet Placement Confirmation: ETT inserted through vocal cords under direct vision, positive ETCO2 and breath sounds checked- equal and bilateral Secured at: 22 cm Tube secured with: Tape Dental Injury: Teeth and Oropharynx as per pre-operative assessment  Comments: Smooth IV Induction. Eyes taped. Easy mask. DL x 1 with grade 1 view. Atraumatically placed, teeth and lip remain intact as pre-op. Secured with tape. Bilateral breath sounds +/=, EtCO2 +, Adequate TV, VSS.

## 2023-01-29 ENCOUNTER — Encounter (HOSPITAL_COMMUNITY): Payer: Self-pay | Admitting: Orthopedic Surgery

## 2023-01-29 DIAGNOSIS — S72001A Fracture of unspecified part of neck of right femur, initial encounter for closed fracture: Secondary | ICD-10-CM | POA: Diagnosis not present

## 2023-01-29 DIAGNOSIS — B9620 Unspecified Escherichia coli [E. coli] as the cause of diseases classified elsewhere: Secondary | ICD-10-CM

## 2023-01-29 DIAGNOSIS — R7881 Bacteremia: Secondary | ICD-10-CM | POA: Diagnosis not present

## 2023-01-29 LAB — BASIC METABOLIC PANEL
Anion gap: 8 (ref 5–15)
BUN: 28 mg/dL — ABNORMAL HIGH (ref 8–23)
CO2: 22 mmol/L (ref 22–32)
Calcium: 7.9 mg/dL — ABNORMAL LOW (ref 8.9–10.3)
Chloride: 101 mmol/L (ref 98–111)
Creatinine, Ser: 1.43 mg/dL — ABNORMAL HIGH (ref 0.44–1.00)
GFR, Estimated: 39 mL/min — ABNORMAL LOW (ref >60)
Glucose, Bld: 159 mg/dL — ABNORMAL HIGH (ref 70–99)
Potassium: 4.1 mmol/L (ref 3.5–5.1)
Sodium: 131 mmol/L — ABNORMAL LOW (ref 135–145)

## 2023-01-29 LAB — CBC
HCT: 25.1 % — ABNORMAL LOW (ref 36.0–46.0)
Hemoglobin: 8.6 g/dL — ABNORMAL LOW (ref 12.0–15.0)
MCH: 30.3 pg (ref 26.0–34.0)
MCHC: 34.3 g/dL (ref 30.0–36.0)
MCV: 88.4 fL (ref 80.0–100.0)
Platelets: 175 10*3/uL (ref 150–400)
RBC: 2.84 MIL/uL — ABNORMAL LOW (ref 3.87–5.11)
RDW: 13 % (ref 11.5–15.5)
WBC: 9.8 10*3/uL (ref 4.0–10.5)
nRBC: 0 % (ref 0.0–0.2)

## 2023-01-29 LAB — BLOOD CULTURE ID PANEL (REFLEXED) - BCID2

## 2023-01-29 LAB — TYPE AND SCREEN
ABO/RH(D): O POS
Antibody Screen: NEGATIVE

## 2023-01-29 LAB — CK: Total CK: 1712 U/L — ABNORMAL HIGH (ref 38–234)

## 2023-01-29 MED ORDER — ENSURE ENLIVE PO LIQD
237.0000 mL | Freq: Two times a day (BID) | ORAL | Status: DC
  Administered 2023-01-29 – 2023-02-01 (×6): 237 mL via ORAL

## 2023-01-29 MED ORDER — SODIUM CHLORIDE 0.9 % IV SOLN
2.0000 g | Freq: Three times a day (TID) | INTRAVENOUS | Status: DC
  Administered 2023-01-29 – 2023-02-01 (×10): 2 g via INTRAVENOUS
  Filled 2023-01-29 (×12): qty 10

## 2023-01-29 MED ORDER — ADULT MULTIVITAMIN W/MINERALS CH
1.0000 | ORAL_TABLET | Freq: Every day | ORAL | Status: DC
  Administered 2023-01-29 – 2023-02-01 (×4): 1 via ORAL
  Filled 2023-01-29 (×4): qty 1

## 2023-01-29 NOTE — Plan of Care (Signed)

## 2023-01-29 NOTE — Progress Notes (Signed)
PHARMACY - PHYSICIAN COMMUNICATION CRITICAL VALUE ALERT - BLOOD CULTURE IDENTIFICATION (BCID)  Erin Lopez is an 72 y.o. female who presented to [facility] on 01/28/2023 with a chief complaint of after falling and spending 12h on the floor at home.  Assessment:  Started on ABX for UTI, now w/ blood cx growing E.coli in 1 of 4 bottles.  Name of physician (or Provider) Contacted: JDaniels NP  Current antibiotics: aztreonam 1g IV Q8H (d/t true cephalosporin allergy)  Changes to prescribed antibiotics recommended:  Recommendations accepted by provider -- will increase aztreonam dose to 2g IV Q8H.  Results for orders placed or performed during the hospital encounter of 01/28/23  Blood Culture ID Panel (Reflexed) (Collected: 01/28/2023 11:41 AM)  Result Value Ref Range   Enterococcus faecalis NOT DETECTED NOT DETECTED   Enterococcus Faecium NOT DETECTED NOT DETECTED   Listeria monocytogenes NOT DETECTED NOT DETECTED   Staphylococcus species NOT DETECTED NOT DETECTED   Staphylococcus aureus (BCID) NOT DETECTED NOT DETECTED   Staphylococcus epidermidis NOT DETECTED NOT DETECTED   Staphylococcus lugdunensis NOT DETECTED NOT DETECTED   Streptococcus species NOT DETECTED NOT DETECTED   Streptococcus agalactiae NOT DETECTED NOT DETECTED   Streptococcus pneumoniae NOT DETECTED NOT DETECTED   Streptococcus pyogenes NOT DETECTED NOT DETECTED   A.calcoaceticus-baumannii NOT DETECTED NOT DETECTED   Bacteroides fragilis NOT DETECTED NOT DETECTED   Enterobacterales DETECTED (A) NOT DETECTED   Enterobacter cloacae complex NOT DETECTED NOT DETECTED   Escherichia coli DETECTED (A) NOT DETECTED   Klebsiella aerogenes NOT DETECTED NOT DETECTED   Klebsiella oxytoca NOT DETECTED NOT DETECTED   Klebsiella pneumoniae NOT DETECTED NOT DETECTED   Proteus species NOT DETECTED NOT DETECTED   Salmonella species NOT DETECTED NOT DETECTED   Serratia marcescens NOT DETECTED NOT DETECTED   Haemophilus  influenzae NOT DETECTED NOT DETECTED   Neisseria meningitidis NOT DETECTED NOT DETECTED   Pseudomonas aeruginosa NOT DETECTED NOT DETECTED   Stenotrophomonas maltophilia NOT DETECTED NOT DETECTED   Candida albicans NOT DETECTED NOT DETECTED   Candida auris NOT DETECTED NOT DETECTED   Candida glabrata NOT DETECTED NOT DETECTED   Candida krusei NOT DETECTED NOT DETECTED   Candida parapsilosis NOT DETECTED NOT DETECTED   Candida tropicalis NOT DETECTED NOT DETECTED   Cryptococcus neoformans/gattii NOT DETECTED NOT DETECTED   CTX-M ESBL NOT DETECTED NOT DETECTED   Carbapenem resistance IMP NOT DETECTED NOT DETECTED   Carbapenem resistance KPC NOT DETECTED NOT DETECTED   Carbapenem resistance NDM NOT DETECTED NOT DETECTED   Carbapenem resist OXA 48 LIKE NOT DETECTED NOT DETECTED   Carbapenem resistance VIM NOT DETECTED NOT DETECTED    [name], PharmD, BCPS  01/29/2023  4:52 AM

## 2023-01-29 NOTE — Evaluation (Signed)
Physical Therapy Evaluation Patient Details Name: Erin Lopez MRN: [id number] DOB: Mar 27, 1951 Today's Date: 01/29/2023  History of Present Illness  72 y.o. female presenting to ED 10/26 after a fall at home. Found to have R femoral neck fx UTI, AKI on CKD2, tachycardia with Qtc prolongation. S/p 10/26 R Proximal femur ORIF WBAT PMH: hypertension, hyperlipidemia, COPD, CKD 2, history of TIA, GERD, bipolar disorder, memory difficulties  Clinical Impression  PTA pt living alone in single story home with 1 step to enter. Pt reports independence with ambulation, ADLs and iADLs. Pt is currently limited by R hip pain, in presence of generalized weakness. Pt is supervision for bed mobility and min A for standing and stepping to recliner. Pt defers further ambulation due to pain. Patient will benefit from continued inpatient follow up therapy, <3 hours/day. PT will continue to follow acutely and will refer to Mobility Specialist.          If plan is discharge home, recommend the following: A little help with walking and/or transfers;A little help with bathing/dressing/bathroom;Assistance with cooking/housework;Assist for transportation;Help with stairs or ramp for entrance   Can travel by private vehicle   Yes    Equipment Recommendations None recommended by PT     Functional Status Assessment Patient has had a recent decline in their functional status and demonstrates the ability to make significant improvements in function in a reasonable and predictable amount of time.     Precautions / Restrictions Precautions Precautions: Fall Precaution Comments: hospitalization precipitated by fall Restrictions Weight Bearing Restrictions: Yes LLE Weight Bearing: Weight bearing as tolerated      Mobility  Bed Mobility Overal bed mobility: Needs Assistance Bed Mobility: Supine to Sit     Supine to sit: Supervision     General bed mobility comments: vc for hand placement on bedrail to  assist in pulling to EoB, HoB elevated and heavy use of bedrail    Transfers Overall transfer level: Needs assistance Equipment used: Rolling walker (2 wheels) Transfers: Sit to/from Stand, Bed to chair/wheelchair/BSC Sit to Stand: Min assist   Step pivot transfers: Min assist       General transfer comment: vc for hand placement for power up to standing, pt with difficulty with sequencing, minA for power up and steadying, once in standing reports visual floaters, that dissipate with marching, minA for stepping over to recliner, pt defers further ambulation due to pain    Ambulation/Gait               General Gait Details: deferred      Balance Overall balance assessment: Needs assistance Sitting-balance support: Feet supported, No upper extremity supported Sitting balance-Leahy Scale: Normal     Standing balance support: Bilateral upper extremity supported Standing balance-Leahy Scale: Poor Standing balance comment: requires UE support for static and dynamic balance                             Pertinent Vitals/Pain Pain Assessment Pain Assessment: 0-10 Pain Score: 7  Pain Location: R hip Pain Descriptors / Indicators: Operative site guarding, Grimacing, Guarding, Throbbing Pain Intervention(s): Limited activity within patient's tolerance, Monitored during session, Premedicated before session, Repositioned    Home Living Family/patient expects to be discharged to:: Private residence Living Arrangements: Alone Available Help at Discharge: Family;Available PRN/intermittently Type of Home: House Home Access: Stairs to enter Entrance Stairs-Rails: None Entrance Stairs-Number of Steps: 1   Home Layout: One level Home Equipment: BSC/3in1;Shower  seat;Rolling Walker (2 wheels)      Prior Function Prior Level of Function : Independent/Modified Independent                     Extremity/Trunk Assessment   Upper Extremity Assessment Upper  Extremity Assessment: Overall WFL for tasks assessed    Lower Extremity Assessment Lower Extremity Assessment: LLE deficits/detail LLE Deficits / Details: pain with L hip flexion and weightbearing,  ROM WFL, strength grossly 3+/5    Cervical / Trunk Assessment Cervical / Trunk Assessment: Kyphotic  Communication   Communication Communication: No apparent difficulties  Cognition Arousal: Alert Behavior During Therapy: WFL for tasks assessed/performed Overall Cognitive Status: History of cognitive impairments - at baseline                                 General Comments: some delayed recall, nonsensical asides,        General Comments General comments (skin integrity, edema, etc.): On 2L O2 via  on entry, SpO2 98%O2, on RA with mobility able to maintain SpO2 94%O2, educated on IS and pt able to achieve max inhalation 960 mL        Assessment/Plan    PT Assessment Patient needs continued PT services  PT Problem List Decreased range of motion;Decreased strength;Decreased activity tolerance;Decreased balance;Decreased mobility;Decreased coordination;Pain;Decreased safety awareness;Decreased knowledge of use of DME       PT Treatment Interventions DME instruction;Gait training    PT Goals (Current goals can be found in the Care Plan section)  Acute Rehab PT Goals PT Goal Formulation: With patient Time For Goal Achievement: 02/12/23 Potential to Achieve Goals: Good    Frequency Min 1X/week        AM-PAC PT "6 Clicks" Mobility  Outcome Measure Help needed turning from your back to your side while in a flat bed without using bedrails?: A Little Help needed moving from lying on your back to sitting on the side of a flat bed without using bedrails?: A Little Help needed moving to and from a bed to a chair (including a wheelchair)?: A Little Help needed standing up from a chair using your arms (e.g., wheelchair or bedside chair)?: A Little Help needed to  walk in hospital room?: A Lot Help needed climbing 3-5 steps with a railing? : Total 6 Click Score: 15    End of Session Equipment Utilized During Treatment: Gait belt;Oxygen Activity Tolerance: Patient limited by pain Patient left: in chair;with call bell/phone within reach;with chair alarm set Nurse Communication: Mobility status PT Visit Diagnosis: Unsteadiness on feet (R26.81);Other abnormalities of gait and mobility (R26.89);History of falling (Z91.81);Muscle weakness (generalized) (M62.81);Difficulty in walking, not elsewhere classified (R26.2);Pain Pain - Right/Left: Left Pain - part of body: Hip    Time: 1308-6578 PT Time Calculation (min) (ACUTE ONLY): 32 min   Charges:   PT Evaluation $PT Eval Moderate Complexity: 1 Mod PT Treatments $Therapeutic Activity: 8-22 mins PT General Charges $$ ACUTE PT VISIT: 1 Visit         Erin Lopez B. Beverely Risen PT, DPT Acute Rehabilitation Services Please use secure chat or  Call Office [phone]   Elon Alas [facility] 01/29/2023, 4:17 PM

## 2023-01-29 NOTE — Progress Notes (Signed)
   Subjective: 1 Day Post-Op Procedure(s) (LRB): PERCUTANEOUS FIXATION OF RIGHT FEMORAL NECK (Right)  Pt with only minimal soreness this morning Still feels sleepy per her report No new issues overnight Patient reports pain as mild.  Objective:   VITALS:   Vitals:   01/29/23 0055 01/29/23 0412  BP: (!) 99/50 (!) 112/59  Pulse: 77 78  Resp: 18 17  Temp: (!) 97.5 F (36.4 C) 97.7 F (36.5 C)  SpO2: 100% 96%    Right hip: well healing percutaneous sites Nv intact distally No rashes or edema distally Gentle rom without pain  LABS Recent Labs    01/28/23 1050 01/28/23 1945  HGB 9.9* 9.2*  HCT 29.8* 27.0*  WBC 12.5*  --   PLT 200  --     Recent Labs    01/28/23 1050 01/28/23 1945  NA 127* 129*  K 3.3* 3.7  BUN 29*  --   CREATININE 1.58*  --   GLUCOSE 140*  --      Assessment/Plan: 1 Day Post-Op Procedure(s) (LRB): PERCUTANEOUS FIXATION OF RIGHT FEMORAL NECK (Right) PT/OT Pain management D/c planning Pulmonary toilet Will continue to monitor her progress   Alphonsa Overall PA-C, MPAS [facility] is now [facility] [address], [place], [place] [address] Phone: [phone] [email or web address] Facebook  [employer]

## 2023-01-29 NOTE — Progress Notes (Deleted)
Pt's blood transfusion in progress, no adverse reactions noted. Per pt request, standing weight to be endorsed to dayshift team after blood transfusion.

## 2023-01-29 NOTE — Progress Notes (Signed)
Initial Nutrition Assessment  DOCUMENTATION CODES:   Not applicable  INTERVENTION:  Ensure Plus High Protein po BID, each supplement provides 350 kcal and 20 grams of protein. Multivitamin with minerals   NUTRITION DIAGNOSIS:   Increased nutrient needs related to hip fracture as evidenced by estimated needs.    GOAL:   Patient will meet greater than or equal to 90% of their needs    MONITOR:   PO intake, Supplement acceptance, Labs, Weight trends  REASON FOR ASSESSMENT:   Consult Assessment of nutrition requirement/status, Hip fracture protocol  ASSESSMENT: 72 y.o. F admitted femoral neck fracture, related to right side fall. Pmh; HLD, HTN, COPD, CKD 2, hx TIA, GERD, bipolar disorder, memory difficulties. Patient reports that she lost balance and fell, was unable to get up in relation to pain and was no found till next morning. No appetite changes reported nor recent weight loss.  Able to self feed with no chewing or swallowing concerns at this time. Currently not receiving MVM, Patient would benefit from MVM, and ONS to help meet increased nutrient needs.   Admit weight: 78.5 kg Current weight: 78.5 kg  Weight history: 01/28/23 78.5 kg  01/19/23 77.2 kg  01/12/23 78.6 kg  01/04/23 79.5 kg  12/15/22 76 kg  11/30/22 76.6 kg  10/27/22 77.6 kg  10/12/22 78 kg  08/26/22 80.8 kg  07/06/22 79.8 kg    Average Meal Intake: No current documentation  Nutritionally Relevant Medications: Scheduled Meds:  citalopram  20 mg Oral Daily   docusate sodium  100 mg Oral BID   lamoTRIgine  300 mg Oral QHS   metoprolol succinate  100 mg Oral Daily   pantoprazole  40 mg Oral Daily    Continuous Infusions:  sodium chloride 75 mL/hr at 01/29/23 0336   aztreonam 2 g (01/29/23 0516)   PRN Meds:.acetaminophen, albuterol, ALPRAZolam, HYDROcodone-acetaminophen, HYDROmorphone (DILAUDID) injection, menthol-cetylpyridinium **OR** phenol, metoCLOPramide **OR** metoCLOPramide (REGLAN)  injection, ondansetron **OR** ondansetron (ZOFRAN) IV, polyethylene glycol, zolpidem  Labs Reviewed: Na; 10/27; 131, 10/26; 129, 10/26; 127 CBG ranges from 140-159 mg/dL over the last 24 hours     NUTRITION - FOCUSED PHYSICAL EXAM:  Deferred  Diet Order:   Diet Order             Diet regular Room service appropriate? Yes; Fluid consistency: Thin  Diet effective now                   EDUCATION NEEDS:   No education needs have been identified at this time  Skin:  Skin Assessment: Skin Integrity Issues: Skin Integrity Issues:: Incisions Incisions: R hip  Last BM:  PTA  Height:   Ht Readings from Last 1 Encounters:  01/28/23 5\' 7"  (1.702 m)    Weight:   Wt Readings from Last 1 Encounters:  01/28/23 78.5 kg    Ideal Body Weight:     BMI:  Body mass index is 27.1 kg/m.  Estimated Nutritional Needs:   Kcal:  2000-2350 kcal  Protein:  100-115 g  Fluid:  41ml/kcal    Jamelle Haring RDN, LDN Clinical Dietitian  RDN pager # available on Amion

## 2023-01-29 NOTE — Progress Notes (Signed)
PROGRESS NOTE    Erin Lopez  YTK:[id number] DOB: 08/01/1950 DOA: 01/28/2023 PCP: Jeoffrey Massed, MD    Brief Narrative:   Erin Lopez is a 72 y.o. female with past medical history significant for HTN, HLD, COPD, CKD stage II, history of TIA, bipolar disorder, GERD, cognitive impairment who presented to [facility] ED on 10/26 from home via EMS with right hip pain following fall.  Patient reports approximately 2200 on 01/27/2023 with sudden onset dizziness causing fall.  Reports struck head on a door frame, unsure of loss of consciousness.  Denies blood thinner use.  Following fall had right leg pain and unable to ambulate.  Unable to lift leg while lying flat.  Eventually patient was found and brought to the ED for further evaluation after EMS activated.  Does report some subjective chills and fever.  Reports recent treatment for pneumonia.  Denies chest pain, no shortness of breath, no abdominal pain, no nausea/vomiting/diarrhea, no cough.  In the ED, temperature 98.4 F, HR 132, RR 24, BP 126/58, SpO2 98% on room air.  WBC 12.5, hemoglobin 9.9, platelet count 200.  Sodium 127, potassium 3.3, chloride 94, CO2 21, glucose 140, BUN 29, creat 1.58.  AST 44, ALT 31, total bilirubin 1.4.  CK 1118.  INR 1.1.  COVID/influenza/RSV PCR negative.  Urinalysis with moderate leukocytes, positive nitrite, few bacteria, 11-20 WBCs.  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  Right hip/pelvis x-ray with minimally displaced/impacted right femoral neck fracture. Patient received 1 L IV fluids and aztreonam in the ED. Aztreonam selected due to patient history of anaphylaxis to cephalosporin and quinolone. Orthopedics consulted in the ED.   Assessment & Plan:   Severe sepsis, POA E. coli  septicemia E. coli urinary tract infection Lactic acidosis Patient presenting to ED following fall after she became dizzy at home.  Patient was tachycardic, tachypneic with elevated WBC count and urinalysis consistent with urinary tract infection. -- WBC 12.5>9.8 -- Urine culture: + > 100K Ecoli, susceptibilities pending -- Blood cultures x 2:  + Ecoli; susceptibilities pending -- Aztreonam 2 g IV every 8 hours; plan 10-day course of antibiotics  Right femoral neck fracture Patient with fall at home. Right hip/pelvis x-ray with minimally displaced/impacted right femoral neck fracture.  Orthopedics was consulted and patient underwent ORIF by Dr. Aundria Rud on 01/28/2023. -- WBAT RLE. -- Tylenol 600 mg p.o. every 6 hours as needed mild pain -- Norco 5-325 mg 1-2 tablets every 6 hours as needed moderate/severe pain -- PT/OT eval pending -- Outpatient follow-up with orthopedics 2 weeks for staple removal  Acute renal failure on CKD stage II Hyponatremia Creatinine elevated 1.58 from a baseline of 1.0.  Likely secondary to dehydration from prolonged downtime, n.p.o. status for surgery. -- Cr 1.58>1.43 -- IVF hydration with NS at 75 mL/h - BMP in aa  Hypokalemia: Resolved Potassium repleted.  Repeat K4.1 this morning.  Essential hypertension -- Metoprolol succinate 100 mg p.o. daily -- Continue aspirin and statin  Hyperlipidemia -- Atorvastatin  COPD -- Singulair 10 mg p.o. nightly -- Brovana neb twice daily -- Incruse Ellipta 1 puff daily  History of TIA -- Continue aspirin and statin  GERD -- Protonix 40 mg p.o. daily  Bipolar disorder Anxiety/depression -- Celexa 20 mg p.o. daily -- Bupropion 3 mg p.o. daily -- Lamictal 3 mg p.o.  nightly -- Xanax 0.5 mg p.o. 3 times daily as needed anxiety    DVT prophylaxis: SCDs Start: 01/28/23 2259 SCDs Start: 01/28/23 1410    Code Status: Full Code Family Communication: No family present at bedside this morning  Disposition  Plan:  Level of care: Telemetry Medical Status is: Inpatient Remains inpatient appropriate because: IV antibiotics, pending PT/OT evaluation    Consultants:  Orthopedics, Dr. Aundria Rud  Procedures:  ORIF right hip fracture, Dr. Aundria Rud 10/26  Antimicrobials:  Perioperative cefazolin   Subjective: Patient seen examined bedside, resting calmly.  Lying in bed.  Awaiting PT/OT evaluation.  Complaining of some soreness to right hip.  Otherwise no other questions, concerns or complaints at this time.  Denies headache, no visual changes, no chest pain, no palpitations, no shortness of breath, no abdominal pain, no fever/chills/night sweats, no nausea cefonicid diarrhea, no focal weakness, no fatigue, no paresthesias.  No acute events overnight per nursing staff.  Objective: Vitals:   01/28/23 2254 01/29/23 0055 01/29/23 0412 01/29/23 0754  BP: (!) 100/58 (!) 99/50 (!) 112/59 (!) 141/81  Pulse: 83 77 78 82  Resp: 19 18 17 18   Temp: 97.7 F (36.5 C) (!) 97.5 F (36.4 C) 97.7 F (36.5 C) 98 F (36.7 C)  TempSrc: Oral Oral Oral   SpO2: 96% 100% 96% 99%  Weight:      Height:        Intake/Output Summary (Last 24 hours) at 01/29/2023 1221 Last data filed at 01/29/2023 8295 Gross per 24 hour  Intake 2530 ml  Output 10 ml  Net 2520 ml   Filed Weights   01/28/23 1048  Weight: 78.5 kg    Examination:  Physical Exam: GEN: NAD, alert and oriented x 3, wd/wn HEENT: NCAT, PERRL, EOMI, sclera clear, MMM PULM: CTAB w/o wheezes/crackles, normal respiratory effort, on room air CV: RRR w/o M/G/R GI: abd soft, NTND, NABS, no R/G/M MSK: no peripheral edema, moves all extremities and implant, noted surgical dressing in place, clean/dry/intact NEURO: CN II-XII intact, no focal deficits, sensation to light touch intact PSYCH: normal mood/affect Integumentary: Noted surgical dressing right lower extremity as above, otherwise no other concerning rashes/lesions/wounds on exposed skin  surfaces.    Data Reviewed: I have personally reviewed following labs and imaging studies  CBC: Recent Labs  Lab 01/28/23 1050 01/28/23 1945 01/29/23 0931  WBC 12.5*  --  9.8  NEUTROABS 10.9*  --   --   HGB 9.9* 9.2* 8.6*  HCT 29.8* 27.0* 25.1*  MCV 89.0  --  88.4  PLT 200  --  175   Basic Metabolic Panel: Recent Labs  Lab 01/28/23 1050 01/28/23 1945 01/29/23 0931  NA 127* 129* 131*  K 3.3* 3.7 4.1  CL 94*  --  101  CO2 21*  --  22  GLUCOSE 140*  --  159*  BUN 29*  --  28*  CREATININE 1.58*  --  1.43*  CALCIUM 8.4*  --  7.9*   GFR: Estimated Creatinine Clearance: 39.5 mL/min (A) (by C-G formula based on SCr of 1.43 mg/dL (H)). Liver Function Tests: Recent Labs  Lab 01/28/23 1050  AST 44*  ALT 31  ALKPHOS 82  BILITOT 1.4*  PROT 6.0*  ALBUMIN 3.4*   No results for input(s): "LIPASE", "AMYLASE" in the last 168 hours. No results for input(s): "AMMONIA" in the last 168 hours. Coagulation Profile: Recent Labs  Lab 01/28/23 1050  INR 1.1   Cardiac Enzymes: Recent Labs  Lab 01/28/23 1050  01/29/23 0931  CKTOTAL 1,118* 1,712*   BNP (last 3 results) No results for input(s): "PROBNP" in the last 8760 hours. HbA1C: No results for input(s): "HGBA1C" in the last 72 hours. CBG: No results for input(s): "GLUCAP" in the last 168 hours. Lipid Profile: No results for input(s): "CHOL", "HDL", "LDLCALC", "TRIG", "CHOLHDL", "LDLDIRECT" in the last 72 hours. Thyroid Function Tests: No results for input(s): "TSH", "T4TOTAL", "FREET4", "T3FREE", "THYROIDAB" in the last 72 hours. Anemia Panel: No results for input(s): "VITAMINB12", "FOLATE", "FERRITIN", "TIBC", "IRON", "RETICCTPCT" in the last 72 hours. Sepsis Labs: Recent Labs  Lab 01/28/23 1146 01/28/23 1327  LATICACIDVEN 2.2* 2.1*    Recent Results (from the past 240 hour(s))  Resp panel by RT-PCR (RSV, Flu A&B, Covid) Anterior Nasal Swab     Status: None   Collection Time: 01/28/23 11:14 AM   Specimen:  Anterior Nasal Swab  Result Value Ref Range Status   SARS Coronavirus 2 by RT PCR NEGATIVE NEGATIVE Final   Influenza A by PCR NEGATIVE NEGATIVE Final   Influenza B by PCR NEGATIVE NEGATIVE Final    Comment: (NOTE) The Xpert Xpress SARS-CoV-2/FLU/RSV plus assay is intended as an aid in the diagnosis of influenza from Nasopharyngeal swab specimens and should not be used as a sole basis for treatment. Nasal washings and aspirates are unacceptable for Xpert Xpress SARS-CoV-2/FLU/RSV testing.  Fact Sheet for Patients: BloggerCourse.com  Fact Sheet for Healthcare Providers: SeriousBroker.it  This test is not yet approved or cleared by the [place] FDA and has been authorized for detection and/or diagnosis of SARS-CoV-2 by FDA under an Emergency Use Authorization (EUA). This EUA will remain in effect (meaning this test can be used) for the duration of the COVID-19 declaration under Section 564(b)(1) of the Act, 21 U.S.C. section 360bbb-3(b)(1), unless the authorization is terminated or revoked.     Resp Syncytial Virus by PCR NEGATIVE NEGATIVE Final    Comment: (NOTE) Fact Sheet for Patients: BloggerCourse.com  Fact Sheet for Healthcare Providers: SeriousBroker.it  This test is not yet approved or cleared by the [place] FDA and has been authorized for detection and/or diagnosis of SARS-CoV-2 by FDA under an Emergency Use Authorization (EUA). This EUA will remain in effect (meaning this test can be used) for the duration of the COVID-19 declaration under Section 564(b)(1) of the Act, 21 U.S.C. section 360bbb-3(b)(1), unless the authorization is terminated or revoked.  Performed at [facility], [address], [place], [place] [address]   Blood Culture (routine x 2)     Status: None (Preliminary result)   Collection Time: 01/28/23 11:18 AM   Specimen: BLOOD   Result Value Ref Range Status   Specimen Description BLOOD RIGHT ANTECUBITAL  Final   Special Requests   Final    BOTTLES DRAWN AEROBIC AND ANAEROBIC Blood Culture adequate volume   Culture  Setup Time   Final    GRAM NEGATIVE RODS IN BOTH AEROBIC AND ANAEROBIC BOTTLES CRITICAL VALUE NOTED.  VALUE IS CONSISTENT WITH PREVIOUSLY REPORTED AND CALLED VALUE. Performed at [facility], [address] [place], [place] [address]    Culture GRAM NEGATIVE RODS  Final   Report Status PENDING  Incomplete  Urine Culture     Status: Abnormal (Preliminary result)   Collection Time: 01/28/23 11:23 AM   Specimen: Urine, Random  Result Value Ref Range Status   Specimen Description URINE, RANDOM  Final   Special Requests NONE Reflexed from [id number]  Final   Culture (A)  Final    >=100,000 COLONIES/mL ESCHERICHIA COLI SUSCEPTIBILITIES TO FOLLOW Performed at [facility], [address], [place], [place] [address]    Report Status PENDING  Incomplete  Blood Culture (routine x 2)     Status: None (Preliminary result)   Collection Time: 01/28/23 11:41 AM   Specimen: BLOOD RIGHT ARM  Result Value Ref Range Status   Specimen Description BLOOD RIGHT ARM  Final   Special Requests   Final    BOTTLES DRAWN AEROBIC AND ANAEROBIC Blood Culture adequate volume   Culture  Setup Time   Final    GRAM NEGATIVE RODS IN BOTH AEROBIC AND ANAEROBIC BOTTLES Organism ID to follow CRITICAL RESULT CALLED TO, READ BACK BY AND VERIFIED WITH: V BRYK,PHARMD@[id number]  01/29/23 MK Performed at [facility], [address] [place], [place] [address]    Culture GRAM NEGATIVE RODS  Final   Report Status PENDING  Incomplete  Blood Culture ID Panel (Reflexed)     Status: Abnormal   Collection Time: 01/28/23 11:41 AM  Result Value Ref Range Status   Enterococcus faecalis NOT DETECTED NOT DETECTED Final   Enterococcus Faecium NOT DETECTED NOT DETECTED Final   Listeria monocytogenes NOT DETECTED NOT DETECTED Final    Staphylococcus species NOT DETECTED NOT DETECTED Final   Staphylococcus aureus (BCID) NOT DETECTED NOT DETECTED Final   Staphylococcus epidermidis NOT DETECTED NOT DETECTED Final   Staphylococcus lugdunensis NOT DETECTED NOT DETECTED Final   Streptococcus species NOT DETECTED NOT DETECTED Final   Streptococcus agalactiae NOT DETECTED NOT DETECTED Final   Streptococcus pneumoniae NOT DETECTED NOT DETECTED Final   Streptococcus pyogenes NOT DETECTED NOT DETECTED Final   A.calcoaceticus-baumannii NOT DETECTED NOT DETECTED Final   Bacteroides fragilis NOT DETECTED NOT DETECTED Final   Enterobacterales DETECTED (A) NOT DETECTED Final    Comment: Enterobacterales represent a large order of gram negative bacteria, not a single organism. CRITICAL RESULT CALLED TO, READ BACK BY AND VERIFIED WITH: V BRYK,PHARMD@[id number]  01/29/23 MK    Enterobacter cloacae complex NOT DETECTED NOT DETECTED Final   Escherichia coli DETECTED (A) NOT DETECTED Final    Comment: CRITICAL RESULT CALLED TO, READ BACK BY AND VERIFIED WITH: V BRYK,PHARMD@[id number]  01/29/23 MK    Klebsiella aerogenes NOT DETECTED NOT DETECTED Final   Klebsiella oxytoca NOT DETECTED NOT DETECTED Final   Klebsiella pneumoniae NOT DETECTED NOT DETECTED Final   Proteus species NOT DETECTED NOT DETECTED Final   Salmonella species NOT DETECTED NOT DETECTED Final   Serratia marcescens NOT DETECTED NOT DETECTED Final   Haemophilus influenzae NOT DETECTED NOT DETECTED Final   Neisseria meningitidis NOT DETECTED NOT DETECTED Final   Pseudomonas aeruginosa NOT DETECTED NOT DETECTED Final   Stenotrophomonas maltophilia NOT DETECTED NOT DETECTED Final   Candida albicans NOT DETECTED NOT DETECTED Final   Candida auris NOT DETECTED NOT DETECTED Final   Candida glabrata NOT DETECTED NOT DETECTED Final   Candida krusei NOT DETECTED NOT DETECTED Final   Candida parapsilosis NOT DETECTED NOT DETECTED Final   Candida tropicalis NOT DETECTED NOT DETECTED Final    Cryptococcus neoformans/gattii NOT DETECTED NOT DETECTED Final   CTX-M ESBL NOT DETECTED NOT DETECTED Final   Carbapenem resistance IMP NOT DETECTED NOT DETECTED Final   Carbapenem resistance KPC NOT DETECTED NOT DETECTED Final   Carbapenem resistance NDM NOT DETECTED NOT DETECTED Final   Carbapenem resist OXA 48 LIKE NOT DETECTED NOT DETECTED Final   Carbapenem resistance VIM NOT DETECTED NOT DETECTED Final  Comment: Performed at [facility], [address] [place], [place] [address]         Radiology Studies: DG HIP UNILAT WITH PELVIS 2-3 VIEWS RIGHT  Result Date: 01/28/2023 CLINICAL DATA:  Elective surgery EXAM: DG HIP (WITH OR WITHOUT PELVIS) 2-3V RIGHT COMPARISON:  Right hip x-ray 01/28/2023 FINDINGS: Two intraoperative fluoroscopic views of the right hip. 2 hip screws are placed fixating femoral neck fracture. Alignment is anatomic. Fluoroscopy time 65.5 seconds. Fluoroscopy dose 8.82 micro gray. IMPRESSION: Intraoperative fluoroscopic views of the right hip. Electronically Signed   By: Darliss Cheney M.D.   On: 01/28/2023 22:45   CT Hip Right Wo Contrast  Result Date: 01/28/2023 CLINICAL DATA:  Hip trauma EXAM: CT OF THE RIGHT HIP WITHOUT CONTRAST TECHNIQUE: Multidetector CT imaging of the right hip was performed according to the standard protocol. Multiplanar CT image reconstructions were also generated. RADIATION DOSE REDUCTION: This exam was performed according to the departmental dose-optimization program which includes automated exposure control, adjustment of the mA and/or kV according to patient size and/or use of iterative reconstruction technique. COMPARISON:  X-ray same day FINDINGS: Bones/Joint/Cartilage There is an acute subcapital mildly impacted femoral neck fracture on the right. Joint spaces are well maintained. Joint effusion present. Ligaments Suboptimally assessed by CT. Muscles and Tendons Negative. Soft tissues No subcutaneous hematoma. Atherosclerotic  calcifications of the aorta present. IMPRESSION: 1. Acute subcapital mildly impacted femoral neck fracture on the right. Aortic Atherosclerosis (ICD10-I70.0). Electronically Signed   By: Darliss Cheney M.D.   On: 01/28/2023 18:11   CT Head Wo Contrast  Result Date: 01/28/2023 CLINICAL DATA:  Head trauma, minor. EXAM: CT HEAD WITHOUT CONTRAST CT CERVICAL SPINE WITHOUT CONTRAST TECHNIQUE: Multidetector CT imaging of the head and cervical spine was performed following the standard protocol without intravenous contrast. Multiplanar CT image reconstructions of the cervical spine were also generated. RADIATION DOSE REDUCTION: This exam was performed according to the departmental dose-optimization program which includes automated exposure control, adjustment of the mA and/or kV according to patient size and/or use of iterative reconstruction technique. COMPARISON:  09/30/2021 FINDINGS: CT HEAD FINDINGS Brain: No evidence of acute infarction, hemorrhage, hydrocephalus, extra-axial collection or mass lesion/mass effect. Prominence of the sulci and ventricles. There is mild diffuse low-attenuation within the subcortical and periventricular white matter compatible with chronic microvascular disease. Vascular: No hyperdense vessel or unexpected calcification. Skull: Normal. Negative for fracture or focal lesion. Sinuses/Orbits: None Other: . CT CERVICAL SPINE FINDINGS Alignment: No acute, posttraumatic malalignment.Unchanged chronic anterolisthesis of C2 on C3 which measures 4.8 cm on today's study, image 37/9. Previously this measured the same. Anterolisthesis of C7 on T1 measures 3.8 cm, also unchanged from previous exam Skull base and vertebrae: No acute fracture. No primary bone lesion or focal pathologic process. Soft tissues and spinal canal: No prevertebral fluid or swelling. No visible canal hematoma. Disc levels: Prior C3 through C7 ACDF status post C6 and C7 hardware removal. Unchanged pseudoarthrosis at C3-4 and  C4-5. Prior left laminectomy at C7. Severe left uncovertebral hypertrophy at C2-3. Upper chest: Negative. Other: None IMPRESSION: 1. No acute intracranial abnormalities. 2. Chronic microvascular disease and cerebral atrophy. 3. No evidence for cervical spine fracture or subluxation. 4. Prior C3 through C7 ACDF status post C6 and C7 hardware removal. 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 5. Unchanged chronic anterolisthesis of C2 on C3 and C7 on T1. Electronically Signed   By: Signa Kell M.D.   On: 01/28/2023 13:12   CT Cervical Spine Wo  Contrast  Result Date: 01/28/2023 CLINICAL DATA:  Head trauma, minor. EXAM: CT HEAD WITHOUT CONTRAST CT CERVICAL SPINE WITHOUT CONTRAST TECHNIQUE: Multidetector CT imaging of the head and cervical spine was performed following the standard protocol without intravenous contrast. Multiplanar CT image reconstructions of the cervical spine were also generated. RADIATION DOSE REDUCTION: This exam was performed according to the departmental dose-optimization program which includes automated exposure control, adjustment of the mA and/or kV according to patient size and/or use of iterative reconstruction technique. COMPARISON:  09/30/2021 FINDINGS: CT HEAD FINDINGS Brain: No evidence of acute infarction, hemorrhage, hydrocephalus, extra-axial collection or mass lesion/mass effect. Prominence of the sulci and ventricles. There is mild diffuse low-attenuation within the subcortical and periventricular white matter compatible with chronic microvascular disease. Vascular: No hyperdense vessel or unexpected calcification. Skull: Normal. Negative for fracture or focal lesion. Sinuses/Orbits: None Other: . CT CERVICAL SPINE FINDINGS Alignment: No acute, posttraumatic malalignment.Unchanged chronic anterolisthesis of C2 on C3 which measures 4.8 cm on today's study, image 37/9. Previously this measured the same. Anterolisthesis of C7 on T1 measures 3.8 cm, also unchanged from previous  exam Skull base and vertebrae: No acute fracture. No primary bone lesion or focal pathologic process. Soft tissues and spinal canal: No prevertebral fluid or swelling. No visible canal hematoma. Disc levels: Prior C3 through C7 ACDF status post C6 and C7 hardware removal. 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 Prior left laminectomy at C7. Severe left uncovertebral hypertrophy at C2-3. Upper chest: Negative. Other: None IMPRESSION: 1. No acute intracranial abnormalities. 2. Chronic microvascular disease and cerebral atrophy. 3. No evidence for cervical spine fracture or subluxation. 4. Prior C3 through C7 ACDF status post C6 and C7 hardware removal. 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 5. Unchanged chronic anterolisthesis of C2 on C3 and C7 on T1. Electronically Signed   By: Signa Kell M.D.   On: 01/28/2023 13:12   DG Chest Port 1 View  Result Date: 01/28/2023 CLINICAL DATA:  MC-DGQuestionable sepsis - evaluate for abnormality fall. Found on floor. EXAM: PORTABLE CHEST 1 VIEW COMPARISON:  01/12/2023 FINDINGS: Normal mediastinum and cardiac silhouette. Normal pulmonary vasculature. No evidence of effusion, infiltrate, or pneumothorax. No acute bony abnormality. IMPRESSION: No acute cardiopulmonary process. Electronically Signed   By: Genevive Bi M.D.   On: 01/28/2023 12:20   DG Hip Unilat W or Wo Pelvis 2-3 Views Right  Result Date: 01/28/2023 CLINICAL DATA:  72 year old female with history of trauma from a fall. Right hip pain. EXAM: DG HIP (WITH OR WITHOUT PELVIS) 2-3V RIGHT COMPARISON:  No priors. FINDINGS: Three views of the bony pelvis in the right hip demonstrate no acute displaced fracture of the bony pelvic ring. There is a minimally displaced mildly impacted transcervical right femoral neck fracture. Femoral head remains located. Visualized portions of the left proximal femur appear intact. There is joint space narrowing, subchondral sclerosis, subchondral cyst formation and  osteophyte formation in the hip joints bilaterally, indicative of osteoarthritis. IMPRESSION: 1. Minimally displaced likely mildly impacted transcervical right femoral neck fracture, as above. Electronically Signed   By: Trudie Reed M.D.   On: 01/28/2023 12:20        Scheduled Meds:  acetaminophen  500 mg Oral Once   arformoterol  15 mcg Nebulization BID   And   umeclidinium bromide  1 puff Inhalation Daily   aspirin EC  325 mg Oral Q breakfast   buPROPion  300 mg Oral Daily   citalopram  20 mg Oral Daily   docusate sodium  100 mg Oral BID   feeding supplement  237 mL Oral BID BM   lamoTRIgine  300 mg Oral QHS   metoprolol succinate  100 mg Oral Daily   montelukast  10 mg Oral QPM   multivitamin with minerals  1 tablet Oral Daily   pantoprazole  40 mg Oral Daily   Continuous Infusions:  sodium chloride 75 mL/hr at 01/29/23 0336   aztreonam 2 g (01/29/23 0516)     LOS: 1 day    Time spent: 52 minutes spent on chart review, discussion with nursing staff, consultants, updating family and interview/physical exam; more than 50% of that time was spent in counseling and/or coordination of care.    Alvira Philips [place], DO [facility] Available via Epic secure chat 7am-7pm After these hours, please refer to coverage provider listed on [email or web address] 01/29/2023, 12:21 PM

## 2023-01-30 ENCOUNTER — Other Ambulatory Visit: Payer: Self-pay | Admitting: Cardiology

## 2023-01-30 DIAGNOSIS — S72001A Fracture of unspecified part of neck of right femur, initial encounter for closed fracture: Secondary | ICD-10-CM | POA: Diagnosis not present

## 2023-01-30 DIAGNOSIS — R7881 Bacteremia: Secondary | ICD-10-CM | POA: Diagnosis not present

## 2023-01-30 DIAGNOSIS — B9620 Unspecified Escherichia coli [E. coli] as the cause of diseases classified elsewhere: Secondary | ICD-10-CM | POA: Diagnosis not present

## 2023-01-30 LAB — URINE CULTURE: Culture: >=100000 — AB

## 2023-01-30 LAB — CBC
HCT: 26.3 % — ABNORMAL LOW (ref 36.0–46.0)
Hemoglobin: 8.8 g/dL — ABNORMAL LOW (ref 12.0–15.0)
MCH: 30.4 pg (ref 26.0–34.0)
MCHC: 33.5 g/dL (ref 30.0–36.0)
MCV: 91 fL (ref 80.0–100.0)
Platelets: 222 10*3/uL (ref 150–400)
RBC: 2.89 MIL/uL — ABNORMAL LOW (ref 3.87–5.11)
RDW: 13.1 % (ref 11.5–15.5)
WBC: 15.3 10*3/uL — ABNORMAL HIGH (ref 4.0–10.5)
nRBC: 0 % (ref 0.0–0.2)

## 2023-01-30 LAB — COMPREHENSIVE METABOLIC PANEL
ALT: 38 U/L (ref 0–44)
AST: 64 U/L — ABNORMAL HIGH (ref 15–41)
Albumin: 2.8 g/dL — ABNORMAL LOW (ref 3.5–5.0)
Alkaline Phosphatase: 83 U/L (ref 38–126)
Anion gap: 7 (ref 5–15)
BUN: 44 mg/dL — ABNORMAL HIGH (ref 8–23)
CO2: 22 mmol/L (ref 22–32)
Calcium: 8.3 mg/dL — ABNORMAL LOW (ref 8.9–10.3)
Chloride: 101 mmol/L (ref 98–111)
Creatinine, Ser: 1.95 mg/dL — ABNORMAL HIGH (ref 0.44–1.00)
GFR, Estimated: 27 mL/min — ABNORMAL LOW (ref >60)
Glucose, Bld: 144 mg/dL — ABNORMAL HIGH (ref 70–99)
Potassium: 4.1 mmol/L (ref 3.5–5.1)
Sodium: 130 mmol/L — ABNORMAL LOW (ref 135–145)
Total Bilirubin: 0.5 mg/dL (ref 0.3–1.2)
Total Protein: 5.6 g/dL — ABNORMAL LOW (ref 6.5–8.1)

## 2023-01-30 LAB — LACTIC ACID, PLASMA: Lactic Acid, Venous: 2.2 mmol/L (ref 0.5–1.9)

## 2023-01-30 LAB — MAGNESIUM: Magnesium: 2.5 mg/dL — ABNORMAL HIGH (ref 1.7–2.4)

## 2023-01-30 LAB — CK: Total CK: 598 U/L — ABNORMAL HIGH (ref 38–234)

## 2023-01-30 MED ORDER — HYDROCODONE-ACETAMINOPHEN 7.5-325 MG PO TABS: 1.0000 | ORAL_TABLET | ORAL | 0 refills | Status: DC | PRN

## 2023-01-30 MED ORDER — SODIUM CHLORIDE 0.9 % IV SOLN: INTRAVENOUS | Status: DC

## 2023-01-30 MED ORDER — LORATADINE 10 MG PO TABS
10.0000 mg | ORAL_TABLET | Freq: Every day | ORAL | Status: DC
  Administered 2023-01-30 – 2023-02-01 (×3): 10 mg via ORAL
  Filled 2023-01-30 (×3): qty 1

## 2023-01-30 MED ORDER — ASPIRIN 81 MG PO TBEC
81.0000 mg | DELAYED_RELEASE_TABLET | Freq: Two times a day (BID) | ORAL | 0 refills | Status: AC
Start: 2023-01-30 — End: 2023-03-16

## 2023-01-30 MED ORDER — ALUM & MAG HYDROXIDE-SIMETH 200-200-20 MG/5ML PO SUSP
15.0000 mL | Freq: Once | ORAL | Status: AC
  Administered 2023-01-30: 15 mL via ORAL
  Filled 2023-01-30: qty 30

## 2023-01-30 MED ORDER — BUPROPION HCL ER (XL) 150 MG PO TB24
150.0000 mg | ORAL_TABLET | Freq: Every day | ORAL | Status: DC
  Administered 2023-01-31 – 2023-02-01 (×2): 150 mg via ORAL
  Filled 2023-01-30 (×2): qty 1

## 2023-01-30 NOTE — Social Work (Signed)
Please be advised that the above-named patient will require a short-term nursing home stay-anticipated 30 days or less for rehabilitation and strengthening. The plan is for return home.  

## 2023-01-30 NOTE — Plan of Care (Signed)
  Problem: Education: Goal: Knowledge of General Education information will improve Description: Including pain rating scale, medication(s)/side effects and non-pharmacologic comfort measures Outcome: Progressing   Problem: Health Behavior/Discharge Planning: Goal: Ability to manage health-related needs will improve Outcome: Progressing   Problem: Clinical Measurements: Goal: Ability to maintain clinical measurements within normal limits will improve Outcome: Progressing Goal: Will remain free from infection Outcome: Progressing Goal: Diagnostic test results will improve Outcome: Progressing Goal: Respiratory complications will improve Outcome: Progressing Goal: Cardiovascular complication will be avoided Outcome: Progressing   Problem: Activity: Goal: Risk for activity intolerance will decrease Outcome: Progressing   Problem: Nutrition: Goal: Adequate nutrition will be maintained Outcome: Progressing   Problem: Coping: Goal: Level of anxiety will decrease Outcome: Progressing   Problem: Elimination: Goal: Will not experience complications related to bowel motility Outcome: Progressing Goal: Will not experience complications related to urinary retention Outcome: Progressing   Problem: Pain Management: Goal: General experience of comfort will improve Outcome: Progressing   Problem: Safety: Goal: Ability to remain free from injury will improve Outcome: Progressing   Problem: Skin Integrity: Goal: Risk for impaired skin integrity will decrease Outcome: Progressing   Problem: Education: Goal: Verbalization of understanding the information provided (i.e., activity precautions, restrictions, etc) will improve Outcome: Progressing Goal: Individualized Educational Video(s) Outcome: Progressing   Problem: Activity: Goal: Ability to ambulate and perform ADLs will improve Outcome: Progressing   Problem: Clinical Measurements: Goal: Postoperative complications will  be avoided or minimized Outcome: Progressing   Problem: Self-Concept: Goal: Ability to maintain and perform role responsibilities to the fullest extent possible will improve Outcome: Progressing   Problem: Pain Management: Goal: Pain level will decrease Outcome: Progressing

## 2023-01-30 NOTE — Evaluation (Signed)
Occupational Therapy Evaluation Patient Details Name: Erin Lopez MRN: [id number] DOB: 04/06/1950 Today's Date: 01/30/2023   History of Present Illness 72 y.o. female presenting to ED 10/26 after a fall at home. Found to have R femoral neck fx UTI, AKI on CKD2, tachycardia with Qtc prolongation. S/p 10/26 R Proximal femur ORIF WBAT PMH: hypertension, hyperlipidemia, COPD, CKD 2, history of TIA, GERD, bipolar disorder, memory difficulties   Clinical Impression   PTA patient independent with ADLs, light IADLs, and mobility. Admitted for above and presents with problem list below.  She requires supervision for bed mobility, min guard for transfers and up to min assist for ADLs.  She has baseline memory difficulties per chart (and reports difficulty managing meds/finances), but does demonstrates decreased attention, problem solving, and safety awareness (scoring 6/28 on short blessed test- questionable impairment).  Based on performance today, believe patient will best benefit from continued OT services acutely and after dc at inpatient setting with <3hrs/day to optimize independence, safety with ADLs and mobility.      If plan is discharge home, recommend the following: A little help with walking and/or transfers;A little help with bathing/dressing/bathroom;Assistance with cooking/housework;Direct supervision/assist for medications management;Direct supervision/assist for financial management;Help with stairs or ramp for entrance;Assist for transportation    Functional Status Assessment  Patient has had a recent decline in their functional status and demonstrates the ability to make significant improvements in function in a reasonable and predictable amount of time.  Equipment Recommendations  Other (comment) (tBD)    Recommendations for Other Services       Precautions / Restrictions Precautions Precautions: Fall Precaution Comments: hospitalization precipitated by  fall Restrictions Weight Bearing Restrictions: Yes RLE Weight Bearing: Weight bearing as tolerated      Mobility Bed Mobility Overal bed mobility: Needs Assistance Bed Mobility: Supine to Sit     Supine to sit: Supervision     General bed mobility comments: cueing for safety    Transfers Overall transfer level: Needs assistance Equipment used: Rolling walker (2 wheels) Transfers: Sit to/from Stand Sit to Stand: Contact guard assist           General transfer comment: cueing for hand placement and safety      Balance Overall balance assessment: Needs assistance Sitting-balance support: Feet supported, No upper extremity supported Sitting balance-Leahy Scale: Normal     Standing balance support: During functional activity, Single extremity supported, Bilateral upper extremity supported, No upper extremity supported Standing balance-Leahy Scale: Poor Standing balance comment: relies on BUE support dynamically but able to engage in ADL tasks with min guard and 0-1 hand support                           ADL either performed or assessed with clinical judgement   ADL Overall ADL's : Needs assistance/impaired     Grooming: Contact guard assist;Wash/dry hands;Standing           Upper Body Dressing : Set up;Sitting   Lower Body Dressing: Minimal assistance;Sit to/from stand   Toilet Transfer: Contact guard assist;Ambulation;Rolling walker (2 wheels)   Toileting- Clothing Manipulation and Hygiene: Contact guard assist;Sit to/from stand       Functional mobility during ADLs: Contact guard assist;Rolling walker (2 wheels);Cueing for safety       Vision   Vision Assessment?: No apparent visual deficits     Perception         Praxis  Pertinent Vitals/Pain Pain Assessment Pain Assessment: Faces Faces Pain Scale: Hurts little more Pain Location: R hip Pain Descriptors / Indicators: Operative site guarding, Grimacing, Guarding,  Throbbing Pain Intervention(s): Limited activity within patient's tolerance     Extremity/Trunk Assessment Upper Extremity Assessment Upper Extremity Assessment: Overall WFL for tasks assessed   Lower Extremity Assessment Lower Extremity Assessment: Defer to PT evaluation   Cervical / Trunk Assessment Cervical / Trunk Assessment: Kyphotic   Communication Communication Communication: No apparent difficulties   Cognition Arousal: Alert Behavior During Therapy: WFL for tasks assessed/performed Overall Cognitive Status: History of cognitive impairments - at baseline                                 General Comments: pt with decreased attention and problem sovling, awareness.     General Comments  pt on 2L upon entry, transitioned to RA with Spo2 >92% with good waveform.  Replaced 2L at end of session per pt preference.    Exercises     Shoulder Instructions      Home Living Family/patient expects to be discharged to:: Private residence Living Arrangements: Alone Available Help at Discharge: Family;Available PRN/intermittently Type of Home: House Home Access: Stairs to enter [employer] of Steps: 1 Entrance Stairs-Rails: None Home Layout: One level     Bathroom Shower/Tub: Chief Strategy Officer: Standard     Home Equipment: BSC/3in1;Shower seat;Rolling Environmental consultant (2 wheels)          Prior Functioning/Environment Prior Level of Function : Independent/Modified Independent;Driving               ADLs Comments: reports having assist for cleaning but otherwise independent; she does endorse difficulty with med mgmt and finances at baseline        OT Problem List: Decreased strength;Decreased activity tolerance;Impaired balance (sitting and/or standing);Pain;Decreased knowledge of precautions;Decreased knowledge of use of DME or AE;Decreased safety awareness;Decreased cognition      OT Treatment/Interventions: Self-care/ADL  training;Therapeutic exercise;DME and/or AE instruction;Therapeutic activities;Balance training;Patient/family education    OT Goals(Current goals can be found in the care plan section) Acute Rehab OT Goals Patient Stated Goal: get to rehab OT Goal Formulation: With patient Time For Goal Achievement: 02/13/23 Potential to Achieve Goals: Good  OT Frequency: Min 1X/week    Co-evaluation              AM-PAC OT "6 Clicks" Daily Activity     Outcome Measure Help from another person eating meals?: None Help from another person taking care of personal grooming?: A Little Help from another person toileting, which includes using toliet, bedpan, or urinal?: A Little Help from another person bathing (including washing, rinsing, drying)?: A Little Help from another person to put on and taking off regular upper body clothing?: A Little Help from another person to put on and taking off regular lower body clothing?: A Little 6 Click Score: 19   End of Session Equipment Utilized During Treatment: Rolling walker (2 wheels) Nurse Communication: Mobility status  Activity Tolerance: Patient tolerated treatment well Patient left: in chair;with call bell/phone within reach;with chair alarm set  OT Visit Diagnosis: Other abnormalities of gait and mobility (R26.89);Muscle weakness (generalized) (M62.81);Pain;History of falling (Z91.81) Pain - Right/Left: Right Pain - part of body: Hip                Time: 8841-6606 OT Time Calculation (min): 30 min Charges:  OT General Charges $  OT Visit: 1 Visit OT Evaluation $OT Eval Moderate Complexity: 1 Mod OT Treatments $Self Care/Home Management : 8-22 mins  Barry Brunner, OT Acute Rehabilitation Services Office [phone]   Chancy Milroy 01/30/2023, 10:06 AM

## 2023-01-30 NOTE — Plan of Care (Signed)
  Problem: Education: Goal: Knowledge of General Education information will improve Description: Including pain rating scale, medication(s)/side effects and non-pharmacologic comfort measures Outcome: Progressing   Problem: Clinical Measurements: Goal: Diagnostic test results will improve Outcome: Progressing   Problem: Activity: Goal: Risk for activity intolerance will decrease Outcome: Progressing   Problem: Nutrition: Goal: Adequate nutrition will be maintained Outcome: Progressing   Problem: Elimination: Goal: Will not experience complications related to urinary retention Outcome: Progressing

## 2023-01-30 NOTE — Progress Notes (Signed)
   Subjective:  Erin Lopez is a 72 y.o. female, 2 Days Post-Op   s/p Procedure(s): PERCUTANEOUS FIXATION OF RIGHT FEMORAL NECK   Patient reports pain as mild to moderate.  Needed some pain medication and did not sleep well but doing well overall. Able to pee and passing gas. Denies n/t. Fever or chills.   Objective:   VITALS:   Vitals:   01/29/23 2047 01/30/23 0513 01/30/23 0724 01/30/23 0816  BP:  133/77  138/76  Pulse:  74 72 73  Resp:  18 18 18   Temp:  97.6 F (36.4 C)  (!) 97.5 F (36.4 C)  TempSrc:  Oral  Oral  SpO2: 93% 97% 98% 99%  Weight:      Height:       Hospital bed NAD  RLE:   Neurovascular intact Sensation intact distally Intact pulses distally Dorsiflexion/Plantar flexion intact Incision: dressing C/D/I Compartment soft Ioban dressing clean.   Lab Results  Component Value Date   WBC 15.3 (H) 01/30/2023   HGB 8.8 (L) 01/30/2023   HCT 26.3 (L) 01/30/2023   MCV 91.0 01/30/2023   PLT 222 01/30/2023   BMET    Component Value Date/Time   NA 131 (L) 01/29/2023 0931   NA 135 12/03/2019 1103   K 4.1 01/29/2023 0931   CL 101 01/29/2023 0931   CO2 22 01/29/2023 0931   GLUCOSE 159 (H) 01/29/2023 0931   BUN 28 (H) 01/29/2023 0931   BUN 8 12/03/2019 1103   CREATININE 1.43 (H) 01/29/2023 0931   CREATININE 1.06 (H) 10/12/2022 1603   CALCIUM 7.9 (L) 01/29/2023 0931   GFRNONAA 39 (L) 01/29/2023 0931     Assessment/Plan: 2 Days Post-Op   Principal Problem:   Fracture of femoral neck, right, closed (HCC) Active Problems:   Hyperlipidemia   COPD (chronic obstructive pulmonary disease) (HCC)   Memory difficulties   UTI (urinary tract infection)   Bipolar 1 disorder (HCC)   Hypertension   Stage 2 chronic kidney disease   History of TIA (transient ischemic attack)   Hyponatremia   Elevated CK   Acute renal failure superimposed on stage 2 chronic kidney disease (HCC)   Prolonged QT interval   Advance diet Up with therapy  Dispo: SNF  , will print  pain RX  Follow up in the office in 2 weeks for staple removal and xrays, ortho will sign off. Call for any concerns.   Weightbearing Status: WBAT with walker DVT Prophylaxis:  aspirin inpatient, outpatient Aspirin BID 81mg    [name] 01/30/2023, 9:43 AM  Dion Saucier PA-C  Physician Assistant with Dr. Rebekah [place]

## 2023-01-30 NOTE — TOC Initial Note (Signed)
Transition of Care [facility]) - Initial/Assessment Note    Patient Details  Name: Erin Lopez MRN: [id number] Date of Birth: 05/08/1950  Transition of Care [facility]) CM/SW Contact:    Carley Hammed, LCSW Phone Number: 01/30/2023, 3:01 PM  Clinical Narrative:                 CSW met with pt at bedside to discuss PT rec for SNF. Pt is agreeable to SNF with a preference for a facility on [address]. CSW stated that referrals will be faxed out and CSW will meet with pt tomorrow to go over bed offers. TOC will continue to follow.   Expected Discharge Plan: Skilled Nursing Facility Barriers to Discharge: Continued Medical Work up, English as a second language teacher, SNF Pending bed offer   Patient Goals and CMS Choice Patient states their goals for this hospitalization and ongoing recovery are:: Pt wants to be able to return home more independently. CMS Medicare.gov Compare Post Acute Care list provided to:: Patient Choice offered to / list presented to : Patient      Expected Discharge Plan and Services     Post Acute Care Choice: Skilled Nursing Facility Living arrangements for the past 2 months: Single Family Home                                      Lopez Living Arrangements/Services Living arrangements for the past 2 months: Single Family Home Lives with:: Self Patient language and need for interpreter reviewed:: Yes Do you feel safe going back to the place where you live?: Yes      Need for Family Participation in Patient Care: No (Comment) Care giver support system in place?: No (comment)   Criminal Activity/Legal Involvement Pertinent to Current Situation/Hospitalization: No - Comment as needed  Activities of Daily Living   ADL Screening (condition at time of admission) Independently performs ADLs?: No Does the patient have a NEW difficulty with bathing/dressing/toileting/self-feeding that is expected to last >3 days?: Yes (Initiates electronic notice to provider  for possible OT consult) Does the patient have a NEW difficulty with getting in/out of bed, walking, or climbing stairs that is expected to last >3 days?: Yes (Initiates electronic notice to provider for possible PT consult) Does the patient have a NEW difficulty with communication that is expected to last >3 days?: No Is the patient deaf or have difficulty hearing?: No Does the patient have difficulty seeing, even when wearing glasses/contacts?: No Does the patient have difficulty concentrating, remembering, or making decisions?: No  Permission Sought/Granted Permission sought to share information with : Family Supports Permission granted to share information with : No              Emotional Assessment Appearance:: Appears stated age Attitude/Demeanor/Rapport: Engaged Affect (typically observed): Appropriate Orientation: : Oriented to Self, Oriented to Place, Oriented to  Time, Oriented to Situation Alcohol / Substance Use: Not Applicable Psych Involvement: No (comment)  Admission diagnosis:  Dehydration [E86.0] Hyponatremia [E87.1] Acute cystitis without hematuria [N30.00] Fracture of femoral neck, right, closed (HCC) [S72.001A] Traumatic rhabdomyolysis, initial encounter (HCC) [T79.6XXA] Anemia, unspecified type [D64.9] Patient Active Problem List   Diagnosis Date Noted   Elevated CK 01/28/2023   Acute renal failure superimposed on stage 2 chronic kidney disease (HCC) 01/28/2023   Fracture of femoral neck, right, closed (HCC) 01/28/2023   Prolonged QT interval 01/28/2023   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   Healthcare maintenance 04/19/2019   Overweight (BMI 25.0-29.9) 02/19/2019   Ischemic cardiomyopathy 09/03/2018   Coronary vasospasm (HCC) 07/11/2018   NSTEMI (non-ST elevated myocardial infarction) (HCC) 05/14/2018   Hyponatremia 11/16/2017   Bipolar 1 disorder (HCC) 12/29/2016   Hypertension 12/29/2016   History of TIA (transient ischemic attack)  12/29/2016   Chronic pelvic pain in female 08/22/2016   Stage 2 chronic kidney disease 08/22/2016   Osteoporosis 09/30/2015   Health maintenance examination 06/11/2015   Urethral caruncle 06/05/2015   UTI (urinary tract infection) 06/05/2015   TMJ (temporomandibular joint disorder) 02/25/2015   Right ankle injury 11/05/2014   Cervical spondylosis without myelopathy 12/19/2013   Sprain of neck 12/19/2013   Memory difficulties 12/19/2013   Postnasal drip 10/28/2013   Constipation, chronic 07/29/2013   COPD (chronic obstructive pulmonary disease) (HCC) 06/30/2013   Kidney stone on left side 05/29/2013   Hyperlipidemia 05/29/2013   PCP:  Jeoffrey Massed, MD Pharmacy:   [id number] - [place], [place] - [address] [place] [address] Phone: [phone] Fax: [phone]  [facility] [address] [place] [address] Phone: [phone] Fax: [phone]     Social Determinants of Health (SDOH) Social History: SDOH Screenings   Food Insecurity: No Food Insecurity (01/28/2023)  Housing: Low Risk  (01/28/2023)  Transportation Needs: No Transportation Needs (01/28/2023)  Utilities: Not At Risk (01/28/2023)  Alcohol Screen: Low Risk  (03/11/2020)  Depression (PHQ2-9): Medium Risk (01/19/2023)  Financial Resource Strain: Low Risk  (06/24/2021)  Physical Activity: Inactive (06/24/2021)  Social Connections: Socially Isolated (06/24/2021)  Stress: No Stress Concern Present (06/24/2021)  Tobacco Use: Medium Risk (01/28/2023)   SDOH Interventions:     Readmission Risk Interventions    01/20/2022   11:59 AM  Readmission Risk Prevention Plan  Transportation Screening Complete  PCP or Specialist Appt within 5-7 Days Complete  Home Care Screening Complete  Medication Review (RN CM) Complete

## 2023-01-30 NOTE — [place] FL2 (Signed)
[place] MEDICAID FL2 LEVEL OF CARE FORM     IDENTIFICATION  Patient Name: Erin Lopez Birthdate: [birth year] Sex: female Admission Date (Current Location): 01/28/2023  [facility] and [place] Number:  Producer, television/film/video and Address:  [facility], [address], [place], [place] [address]      Provider Number: [id number]  Attending Physician Name and Address:  [place], Alvira Philips, DO  Relative Name and Phone Number:       Current Level of Care: Hospital Recommended Level of Care: Skilled Nursing Facility Prior Approval Number:    Date Approved/Denied:   PASRR Number: Level 2  Discharge Plan: SNF    Current Diagnoses: Patient Active Problem List   Diagnosis Date Noted   Elevated CK 01/28/2023   Acute renal failure superimposed on stage 2 chronic kidney disease (HCC) 01/28/2023   Fracture of femoral neck, right, closed (HCC) 01/28/2023   Prolonged QT interval 01/28/2023   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   Healthcare maintenance 04/19/2019   Overweight (BMI 25.0-29.9) 02/19/2019   Ischemic cardiomyopathy 09/03/2018   Coronary vasospasm (HCC) 07/11/2018   NSTEMI (non-ST elevated myocardial infarction) (HCC) 05/14/2018   Hyponatremia 11/16/2017   Bipolar 1 disorder (HCC) 12/29/2016   Hypertension 12/29/2016   History of TIA (transient ischemic attack) 12/29/2016   Chronic pelvic pain in female 08/22/2016   Stage 2 chronic kidney disease 08/22/2016   Osteoporosis 09/30/2015   Health maintenance examination 06/11/2015   Urethral caruncle 06/05/2015   UTI (urinary tract infection) 06/05/2015   TMJ (temporomandibular joint disorder) 02/25/2015   Right ankle injury 11/05/2014   Cervical spondylosis without myelopathy 12/19/2013   Sprain of neck 12/19/2013   Memory difficulties 12/19/2013   Postnasal drip 10/28/2013   Constipation, chronic 07/29/2013   COPD (chronic obstructive pulmonary disease) (HCC) 06/30/2013    Kidney stone on left side 05/29/2013   Hyperlipidemia 05/29/2013    Orientation RESPIRATION BLADDER Height & Weight     Self, Time, Situation, Place  O2 (NC2L) Continent Weight: 173 lb (78.5 kg) Height:  5\' 7"  (170.2 cm)  BEHAVIORAL SYMPTOMS/MOOD NEUROLOGICAL BOWEL NUTRITION STATUS      Continent    AMBULATORY STATUS COMMUNICATION OF NEEDS Skin   Limited Assist Verbally Surgical wounds (R hip incision)                       Personal Care Assistance Level of Assistance  Bathing, Feeding, Dressing Bathing Assistance: Limited assistance Feeding assistance: Independent Dressing Assistance: Limited assistance     Functional Limitations Info  Sight, Hearing, Speech Sight Info: Adequate Hearing Info: Adequate Speech Info: Adequate    SPECIAL CARE FACTORS FREQUENCY  PT (By licensed PT), OT (By licensed OT)     PT Frequency: 5x week OT Frequency: 5x week            Contractures Contractures Info: Not present    Additional Factors Info  Code Status, Allergies, Psychotropic Code Status Info: Full Allergies Info: Ceftin (Cefuroxime Axetil)  Cipro (Ciprofloxacin Hcl)  Risperdal (Risperidone)  Fosamax (Alendronate Sodium)  Linzess (Linaclotide)  Morphine And Codeine  Prednisone  Roxicodone (Oxycodone)  Seroquel (Quetiapine) Psychotropic Info: Citalopram, Bupropion         Current Medications (01/30/2023):  This is the current hospital active medication list Current Facility-Administered Medications  Medication Dose Route Frequency Provider Last Rate Last Admin   0.9 %  sodium chloride infusion   Intravenous Continuous [place], Alvira Philips, DO 100  mL/hr at 01/30/23 1302 New Bag at 01/30/23 1302   acetaminophen (TYLENOL) tablet 500 mg  500 mg Oral Once Yolonda Kida, MD       acetaminophen (TYLENOL) tablet 650 mg  650 mg Oral Q6H PRN Yolonda Kida, MD   650 mg at 01/30/23 0049   albuterol (PROVENTIL) (2.5 MG/3ML) 0.083% nebulizer solution 3 mL  3 mL Inhalation  Q6H PRN Yolonda Kida, MD       ALPRAZolam Prudy Feeler) tablet 0.5 mg  0.5 mg Oral TID PRN Yolonda Kida, MD   0.5 mg at 01/30/23 0050   arformoterol (BROVANA) nebulizer solution 15 mcg  15 mcg Nebulization BID Yolonda Kida, MD   15 mcg at 01/30/23 3664   And   umeclidinium bromide (INCRUSE ELLIPTA) 62.5 MCG/ACT 1 puff  1 puff Inhalation Daily Yolonda Kida, MD   1 puff at 01/30/23 4034   aspirin EC tablet 325 mg  325 mg Oral Q breakfast Yolonda Kida, MD   325 mg at 01/30/23 0840   aztreonam (AZACTAM) 2 g in sodium chloride 0.9 % 100 mL IVPB  2 g Intravenous Q8H Juliette Mangle, RPH 200 mL/hr at 01/30/23 1303 2 g at 01/30/23 1303   [START ON 01/31/2023] buPROPion (WELLBUTRIN XL) 24 hr tablet 150 mg  150 mg Oral Daily [place], Eric J, DO       citalopram (CELEXA) tablet 20 mg  20 mg Oral Daily Yolonda Kida, MD   20 mg at 01/30/23 0840   docusate sodium (COLACE) capsule 100 mg  100 mg Oral BID Yolonda Kida, MD   100 mg at 01/30/23 0841   feeding supplement (ENSURE ENLIVE / ENSURE PLUS) liquid 237 mL  237 mL Oral BID BM [place], Eric J, DO   237 mL at 01/30/23 1303   HYDROcodone-acetaminophen (NORCO/VICODIN) 5-325 MG per tablet 1-2 tablet  1-2 tablet Oral Q6H PRN Yolonda Kida, MD   2 tablet at 01/30/23 0342   HYDROmorphone (DILAUDID) injection 0.5 mg  0.5 mg Intravenous Q3H PRN Yolonda Kida, MD   0.5 mg at 01/29/23 2056   lamoTRIgine (LAMICTAL) tablet 300 mg  300 mg Oral QHS Yolonda Kida, MD   300 mg at 01/29/23 2054   loratadine (CLARITIN) tablet 10 mg  10 mg Oral Daily [place], Eric J, DO   10 mg at 01/30/23 0840   menthol-cetylpyridinium (CEPACOL) lozenge 3 mg  1 lozenge Oral PRN Yolonda Kida, MD       Or   phenol [facility]) mouth spray 1 spray  1 spray Mouth/Throat PRN Yolonda Kida, MD       metoCLOPramide [facility]) tablet 5-10 mg  5-10 mg Oral Q8H PRN Yolonda Kida, MD       Or    metoCLOPramide (REGLAN) injection 5-10 mg  5-10 mg Intravenous Q8H PRN Yolonda Kida, MD       metoprolol succinate (TOPROL-XL) 24 hr tablet 100 mg  100 mg Oral Daily Yolonda Kida, MD   100 mg at 01/30/23 0840   montelukast (SINGULAIR) tablet 10 mg  10 mg Oral QPM Yolonda Kida, MD   10 mg at 01/29/23 1720   multivitamin with minerals tablet 1 tablet  1 tablet Oral Daily [place], Eric J, DO   1 tablet at 01/30/23 0840   ondansetron (ZOFRAN) tablet 4 mg  4 mg Oral Q6H PRN Yolonda Kida, MD       Or  ondansetron (ZOFRAN) injection 4 mg  4 mg Intravenous Q6H PRN Yolonda Kida, MD       pantoprazole (PROTONIX) EC tablet 40 mg  40 mg Oral Daily Yolonda Kida, MD   40 mg at 01/30/23 0840   polyethylene glycol (MIRALAX / GLYCOLAX) packet 17 g  17 g Oral Daily PRN Yolonda Kida, MD       zolpidem [facility]) tablet 5 mg  5 mg Oral QHS PRN Yolonda Kida, MD   5 mg at 01/30/23 0309     Discharge Medications: Please see discharge summary for a list of discharge medications.  Relevant Imaging Results:  Relevant Lab Results:   Additional Information SS#: [id number] [place]

## 2023-01-30 NOTE — Progress Notes (Signed)
PROGRESS NOTE    Erin Lopez  AYT:[id number] DOB: November 22, 1950 DOA: 01/28/2023 PCP: Jeoffrey Massed, MD    Brief Narrative:   Erin Lopez is a 72 y.o. female with past medical history significant for HTN, HLD, COPD, CKD stage II, history of TIA, bipolar disorder, GERD, cognitive impairment who presented to [facility] ED on 10/26 from home via EMS with right hip pain following fall.  Patient reports approximately 2200 on 01/27/2023 with sudden onset dizziness causing fall.  Reports struck head on a door frame, unsure of loss of consciousness.  Denies blood thinner use.  Following fall had right leg pain and unable to ambulate.  Unable to lift leg while lying flat.  Eventually patient was found and brought to the ED for further evaluation after EMS activated.  Does report some subjective chills and fever.  Reports recent treatment for pneumonia.  Denies chest pain, no shortness of breath, no abdominal pain, no nausea/vomiting/diarrhea, no cough.  In the ED, temperature 98.4 F, HR 132, RR 24, BP 126/58, SpO2 98% on room air.  WBC 12.5, hemoglobin 9.9, platelet count 200.  Sodium 127, potassium 3.3, chloride 94, CO2 21, glucose 140, BUN 29, creat 1.58.  AST 44, ALT 31, total bilirubin 1.4.  CK 1118.  INR 1.1.  COVID/influenza/RSV PCR negative.  Urinalysis with moderate leukocytes, positive nitrite, few bacteria, 11-20 WBCs.  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  Right hip/pelvis x-ray with minimally displaced/impacted right femoral neck fracture. Patient received 1 L IV fluids and aztreonam in the ED. Aztreonam selected due to patient history of anaphylaxis to cephalosporin and quinolone. Orthopedics consulted in the ED.   Assessment & Plan:   Severe sepsis, POA E. coli  septicemia E. coli urinary tract infection Lactic acidosis Patient presenting to ED following fall after she became dizzy at home.  Patient was tachycardic, tachypneic with elevated WBC count and urinalysis consistent with urinary tract infection. -- WBC 12.5>9.8>15.3 -- Urine culture: + > 100K Ecoli, susceptibilities pending -- Blood cultures x 2:  + Ecoli; susceptibilities pending -- Aztreonam 2 g IV every 8 hours; plan 10-day course of antibiotics  Right femoral neck fracture Patient with fall at home. Right hip/pelvis x-ray with minimally displaced/impacted right femoral neck fracture.  Orthopedics was consulted and patient underwent ORIF by Dr. Aundria Rud on 01/28/2023. -- WBAT RLE. -- Tylenol 600 mg p.o. every 6 hours as needed mild pain -- Norco 5-325 mg 1-2 tablets every 6 hours as needed moderate/severe pain -- PT/OT eval pending -- Outpatient follow-up with orthopedics 2 weeks for staple removal  Acute renal failure on CKD stage II Hyponatremia Creatinine elevated 1.58 from a baseline of 1.0.  Likely secondary to dehydration from prolonged downtime, n.p.o. status for surgery. -- Cr 1.58>1.43>1.95 -- IVF hydration with NS at 100 mL/h -- repeat BMP in am  Hypokalemia: Resolved Potassium repleted.  Repeat K4.1 this morning.  Essential hypertension -- Metoprolol succinate 100 mg p.o. daily -- Continue aspirin and statin  Hyperlipidemia -- Atorvastatin  COPD -- Singulair 10 mg p.o. nightly -- Brovana neb twice daily -- Incruse Ellipta 1 puff daily  History of TIA -- Continue aspirin and statin  GERD -- Protonix 40 mg p.o. daily  Bipolar disorder Anxiety/depression -- Celexa 20 mg p.o. daily -- Bupropion 3 mg p.o. daily -- Lamictal 3 mg  p.o. nightly -- Xanax 0.5 mg p.o. 3 times daily as needed anxiety    DVT prophylaxis: SCDs Start: 01/28/23 2259 SCDs Start: 01/28/23 1410    Code Status: Full Code Family Communication: No family present at bedside this  morning  Disposition Plan:  Level of care: Telemetry Medical Status is: Inpatient Remains inpatient appropriate because: IV antibiotics, will need SNF placement, TOC consulted    Consultants:  Orthopedics, Dr. Aundria Rud  Procedures:  ORIF right hip fracture, Dr. Aundria Rud 10/26  Antimicrobials:  Perioperative cefazolin   Subjective: Patient seen examined bedside, resting calmly.  Lying in bed.  Eating breakfast.  No specific complaints this morning.  Seen by PT with recommendation of SNF placement.  Awaiting OT evaluation.   Denies headache, no visual changes, no chest pain, no palpitations, no shortness of breath, no abdominal pain, no fever/chills/night sweats, no nausea/vomiting/diarrhea, no focal weakness, no fatigue, no paresthesias.  No acute events overnight per nursing staff.  Objective: Vitals:   01/29/23 2047 01/30/23 0513 01/30/23 0724 01/30/23 0816  BP:  133/77  138/76  Pulse:  74 72 73  Resp:  18 18 18   Temp:  97.6 F (36.4 C)  (!) 97.5 F (36.4 C)  TempSrc:  Oral  Oral  SpO2: 93% 97% 98% 99%  Weight:      Height:        Intake/Output Summary (Last 24 hours) at 01/30/2023 1202 Last data filed at 01/30/2023 5784 Gross per 24 hour  Intake 1120 ml  Output --  Net 1120 ml   Filed Weights   01/28/23 1048  Weight: 78.5 kg    Examination:  Physical Exam: GEN: NAD, alert and oriented x 3, wd/wn HEENT: NCAT, PERRL, EOMI, sclera clear, MMM PULM: CTAB w/o wheezes/crackles, normal respiratory effort, on room air CV: RRR w/o M/G/R GI: abd soft, NTND, NABS, no R/G/M MSK: no peripheral edema, moves all extremities and implant, noted surgical dressing in place, clean/dry/intact NEURO: CN II-XII intact, no focal deficits, sensation to light touch intact PSYCH: normal mood/affect Integumentary: Noted surgical dressing right lower extremity as above, otherwise no other concerning rashes/lesions/wounds on exposed skin surfaces.    Data Reviewed: I have personally  reviewed following labs and imaging studies  CBC: Recent Labs  Lab 01/28/23 1050 01/28/23 1945 01/29/23 0931 01/30/23 0907  WBC 12.5*  --  9.8 15.3*  NEUTROABS 10.9*  --   --   --   HGB 9.9* 9.2* 8.6* 8.8*  HCT 29.8* 27.0* 25.1* 26.3*  MCV 89.0  --  88.4 91.0  PLT 200  --  175 222   Basic Metabolic Panel: Recent Labs  Lab 01/28/23 1050 01/28/23 1945 01/29/23 0931 01/30/23 0907  NA 127* 129* 131* 130*  K 3.3* 3.7 4.1 4.1  CL 94*  --  101 101  CO2 21*  --  22 22  GLUCOSE 140*  --  159* 144*  BUN 29*  --  28* 44*  CREATININE 1.58*  --  1.43* 1.95*  CALCIUM 8.4*  --  7.9* 8.3*  MG  --   --   --  2.5*   GFR: Estimated Creatinine Clearance: 29 mL/min (A) (by C-G formula based on SCr of 1.95 mg/dL (H)). Liver Function Tests: Recent Labs  Lab 01/28/23 1050 01/30/23 0907  AST 44* 64*  ALT 31 38  ALKPHOS 82 83  BILITOT 1.4* 0.5  PROT 6.0* 5.6*  ALBUMIN 3.4* 2.8*   No results for input(s): "LIPASE", "AMYLASE" in the last 168 hours.  No results for input(s): "AMMONIA" in the last 168 hours. Coagulation Profile: Recent Labs  Lab 01/28/23 1050  INR 1.1   Cardiac Enzymes: Recent Labs  Lab 01/28/23 1050 01/29/23 0931 01/30/23 0907  CKTOTAL 1,118* 1,712* 598*   BNP (last 3 results) No results for input(s): "PROBNP" in the last 8760 hours. HbA1C: No results for input(s): "HGBA1C" in the last 72 hours. CBG: No results for input(s): "GLUCAP" in the last 168 hours. Lipid Profile: No results for input(s): "CHOL", "HDL", "LDLCALC", "TRIG", "CHOLHDL", "LDLDIRECT" in the last 72 hours. Thyroid Function Tests: No results for input(s): "TSH", "T4TOTAL", "FREET4", "T3FREE", "THYROIDAB" in the last 72 hours. Anemia Panel: No results for input(s): "VITAMINB12", "FOLATE", "FERRITIN", "TIBC", "IRON", "RETICCTPCT" in the last 72 hours. Sepsis Labs: Recent Labs  Lab 01/28/23 1146 01/28/23 1327 01/30/23 0907  LATICACIDVEN 2.2* 2.1* 2.2*    Recent Results (from the past  240 hour(s))  Resp panel by RT-PCR (RSV, Flu A&B, Covid) Anterior Nasal Swab     Status: None   Collection Time: 01/28/23 11:14 AM   Specimen: Anterior Nasal Swab  Result Value Ref Range Status   SARS Coronavirus 2 by RT PCR NEGATIVE NEGATIVE Final   Influenza A by PCR NEGATIVE NEGATIVE Final   Influenza B by PCR NEGATIVE NEGATIVE Final    Comment: (NOTE) The Xpert Xpress SARS-CoV-2/FLU/RSV plus assay is intended as an aid in the diagnosis of influenza from Nasopharyngeal swab specimens and should not be used as a sole basis for treatment. Nasal washings and aspirates are unacceptable for Xpert Xpress SARS-CoV-2/FLU/RSV testing.  Fact Sheet for Patients: BloggerCourse.com  Fact Sheet for Healthcare Providers: SeriousBroker.it  This test is not yet approved or cleared by the [place] FDA and has been authorized for detection and/or diagnosis of SARS-CoV-2 by FDA under an Emergency Use Authorization (EUA). This EUA will remain in effect (meaning this test can be used) for the duration of the COVID-19 declaration under Section 564(b)(1) of the Act, 21 U.S.C. section 360bbb-3(b)(1), unless the authorization is terminated or revoked.     Resp Syncytial Virus by PCR NEGATIVE NEGATIVE Final    Comment: (NOTE) Fact Sheet for Patients: BloggerCourse.com  Fact Sheet for Healthcare Providers: SeriousBroker.it  This test is not yet approved or cleared by the [place] FDA and has been authorized for detection and/or diagnosis of SARS-CoV-2 by FDA under an Emergency Use Authorization (EUA). This EUA will remain in effect (meaning this test can be used) for the duration of the COVID-19 declaration under Section 564(b)(1) of the Act, 21 U.S.C. section 360bbb-3(b)(1), unless the authorization is terminated or revoked.  Performed at [facility], [address],  [place], [place] [address]   Blood Culture (routine x 2)     Status: None (Preliminary result)   Collection Time: 01/28/23 11:18 AM   Specimen: BLOOD  Result Value Ref Range Status   Specimen Description BLOOD RIGHT ANTECUBITAL  Final   Special Requests   Final    BOTTLES DRAWN AEROBIC AND ANAEROBIC Blood Culture adequate volume   Culture  Setup Time   Final    GRAM NEGATIVE RODS IN BOTH AEROBIC AND ANAEROBIC BOTTLES CRITICAL VALUE NOTED.  VALUE IS CONSISTENT WITH PREVIOUSLY REPORTED AND CALLED VALUE.    Culture   Final    GRAM NEGATIVE RODS IDENTIFICATION TO FOLLOW Performed at [facility], [address] [place], [place] [address]    Report Status PENDING  Incomplete  Urine Culture  Status: Abnormal   Collection Time: 01/28/23 11:23 AM   Specimen: Urine, Random  Result Value Ref Range Status   Specimen Description URINE, RANDOM  Final   Special Requests   Final    NONE Reflexed from [id number] Performed at [facility], [address] [place], [place] [address]    Culture >=100,000 COLONIES/mL ESCHERICHIA COLI (A)  Final   Report Status 01/30/2023 FINAL  Final   Organism ID, Bacteria ESCHERICHIA COLI (A)  Final      Susceptibility   Escherichia coli - MIC*    AMPICILLIN >=32 RESISTANT Resistant     CEFAZOLIN <=4 SENSITIVE Sensitive     CEFEPIME <=0.12 SENSITIVE Sensitive     CEFTRIAXONE <=0.25 SENSITIVE Sensitive     CIPROFLOXACIN <=0.25 SENSITIVE Sensitive     GENTAMICIN <=1 SENSITIVE Sensitive     IMIPENEM <=0.25 SENSITIVE Sensitive     NITROFURANTOIN <=16 SENSITIVE Sensitive     TRIMETH/SULFA <=20 SENSITIVE Sensitive     AMPICILLIN/SULBACTAM 16 INTERMEDIATE Intermediate     PIP/TAZO <=4 SENSITIVE Sensitive ug/mL    * >=100,000 COLONIES/mL ESCHERICHIA COLI  Blood Culture (routine x 2)     Status: Abnormal (Preliminary result)   Collection Time: 01/28/23 11:41 AM   Specimen: BLOOD RIGHT ARM  Result Value Ref Range Status   Specimen Description BLOOD RIGHT  ARM  Final   Special Requests   Final    BOTTLES DRAWN AEROBIC AND ANAEROBIC Blood Culture adequate volume   Culture  Setup Time   Final    GRAM NEGATIVE RODS IN BOTH AEROBIC AND ANAEROBIC BOTTLES CRITICAL RESULT CALLED TO, READ BACK BY AND VERIFIED WITH: V BRYK,PHARMD@[id number]  01/29/23 MK    Culture (A)  Final    ESCHERICHIA COLI SUSCEPTIBILITIES TO FOLLOW Performed at [facility], [address], [place], [place] [address]    Report Status PENDING  Incomplete  Blood Culture ID Panel (Reflexed)     Status: Abnormal   Collection Time: 01/28/23 11:41 AM  Result Value Ref Range Status   Enterococcus faecalis NOT DETECTED NOT DETECTED Final   Enterococcus Faecium NOT DETECTED NOT DETECTED Final   Listeria monocytogenes NOT DETECTED NOT DETECTED Final   Staphylococcus species NOT DETECTED NOT DETECTED Final   Staphylococcus aureus (BCID) NOT DETECTED NOT DETECTED Final   Staphylococcus epidermidis NOT DETECTED NOT DETECTED Final   Staphylococcus lugdunensis NOT DETECTED NOT DETECTED Final   Streptococcus species NOT DETECTED NOT DETECTED Final   Streptococcus agalactiae NOT DETECTED NOT DETECTED Final   Streptococcus pneumoniae NOT DETECTED NOT DETECTED Final   Streptococcus pyogenes NOT DETECTED NOT DETECTED Final   A.calcoaceticus-baumannii NOT DETECTED NOT DETECTED Final   Bacteroides fragilis NOT DETECTED NOT DETECTED Final   Enterobacterales DETECTED (A) NOT DETECTED Final    Comment: Enterobacterales represent a large order of gram negative bacteria, not a single organism. CRITICAL RESULT CALLED TO, READ BACK BY AND VERIFIED WITH: V BRYK,PHARMD@0435  01/29/23 MK    Enterobacter cloacae complex NOT DETECTED NOT DETECTED Final   Escherichia coli DETECTED (A) NOT DETECTED Final    Comment: CRITICAL RESULT CALLED TO, READ BACK BY AND VERIFIED WITH: V BRYK,PHARMD@0435  01/29/23 MK    Klebsiella aerogenes NOT DETECTED NOT DETECTED Final   Klebsiella oxytoca NOT DETECTED NOT  DETECTED Final   Klebsiella pneumoniae NOT DETECTED NOT DETECTED Final   Proteus species NOT DETECTED NOT DETECTED Final   Salmonella species NOT DETECTED NOT DETECTED Final   Serratia marcescens NOT DETECTED NOT DETECTED Final  Haemophilus influenzae NOT DETECTED NOT DETECTED Final   Neisseria meningitidis NOT DETECTED NOT DETECTED Final   Pseudomonas aeruginosa NOT DETECTED NOT DETECTED Final   Stenotrophomonas maltophilia NOT DETECTED NOT DETECTED Final   Candida albicans NOT DETECTED NOT DETECTED Final   Candida auris NOT DETECTED NOT DETECTED Final   Candida glabrata NOT DETECTED NOT DETECTED Final   Candida krusei NOT DETECTED NOT DETECTED Final   Candida parapsilosis NOT DETECTED NOT DETECTED Final   Candida tropicalis NOT DETECTED NOT DETECTED Final   Cryptococcus neoformans/gattii NOT DETECTED NOT DETECTED Final   CTX-M ESBL NOT DETECTED NOT DETECTED Final   Carbapenem resistance IMP NOT DETECTED NOT DETECTED Final   Carbapenem resistance KPC NOT DETECTED NOT DETECTED Final   Carbapenem resistance NDM NOT DETECTED NOT DETECTED Final   Carbapenem resist OXA 48 LIKE NOT DETECTED NOT DETECTED Final   Carbapenem resistance VIM NOT DETECTED NOT DETECTED Final    Comment: Performed at [facility], [address] [place], [place] [address]         Radiology Studies: DG HIP UNILAT WITH PELVIS 2-3 VIEWS RIGHT  Result Date: 01/28/2023 CLINICAL DATA:  Elective surgery EXAM: DG HIP (WITH OR WITHOUT PELVIS) 2-3V RIGHT COMPARISON:  Right hip x-ray 01/28/2023 FINDINGS: Two intraoperative fluoroscopic views of the right hip. 2 hip screws are placed fixating femoral neck fracture. Alignment is anatomic. Fluoroscopy time 65.5 seconds. Fluoroscopy dose 8.82 micro gray. IMPRESSION: Intraoperative fluoroscopic views of the right hip. Electronically Signed   By: Darliss Cheney M.D.   On: 01/28/2023 22:45   CT Hip Right Wo Contrast  Result Date: 01/28/2023 CLINICAL DATA:  Hip trauma  EXAM: CT OF THE RIGHT HIP WITHOUT CONTRAST TECHNIQUE: Multidetector CT imaging of the right hip was performed according to the standard protocol. Multiplanar CT image reconstructions were also generated. RADIATION DOSE REDUCTION: This exam was performed according to the departmental dose-optimization program which includes automated exposure control, adjustment of the mA and/or kV according to patient size and/or use of iterative reconstruction technique. COMPARISON:  X-ray same day FINDINGS: Bones/Joint/Cartilage There is an acute subcapital mildly impacted femoral neck fracture on the right. Joint spaces are well maintained. Joint effusion present. Ligaments Suboptimally assessed by CT. Muscles and Tendons Negative. Soft tissues No subcutaneous hematoma. Atherosclerotic calcifications of the aorta present. IMPRESSION: 1. Acute subcapital mildly impacted femoral neck fracture on the right. Aortic Atherosclerosis (ICD10-I70.0). Electronically Signed   By: Darliss Cheney M.D.   On: 01/28/2023 18:11   CT Head Wo Contrast  Result Date: 01/28/2023 CLINICAL DATA:  Head trauma, minor. EXAM: CT HEAD WITHOUT CONTRAST CT CERVICAL SPINE WITHOUT CONTRAST TECHNIQUE: Multidetector CT imaging of the head and cervical spine was performed following the standard protocol without intravenous contrast. Multiplanar CT image reconstructions of the cervical spine were also generated. RADIATION DOSE REDUCTION: This exam was performed according to the departmental dose-optimization program which includes automated exposure control, adjustment of the mA and/or kV according to patient size and/or use of iterative reconstruction technique. COMPARISON:  09/30/2021 FINDINGS: CT HEAD FINDINGS Brain: No evidence of acute infarction, hemorrhage, hydrocephalus, extra-axial collection or mass lesion/mass effect. Prominence of the sulci and ventricles. There is mild diffuse low-attenuation within the subcortical and periventricular white matter  compatible with chronic microvascular disease. Vascular: No hyperdense vessel or unexpected calcification. Skull: Normal. Negative for fracture or focal lesion. Sinuses/Orbits: None Other: . CT CERVICAL SPINE FINDINGS Alignment: No acute, posttraumatic malalignment.Unchanged chronic anterolisthesis of C2 on C3 which measures 4.8 cm  on today's study, image 37/9. Previously this measured the same. Anterolisthesis of C7 on T1 measures 3.8 cm, also unchanged from previous exam Skull base and vertebrae: No acute fracture. No primary bone lesion or focal pathologic process. Soft tissues and spinal canal: No prevertebral fluid or swelling. No visible canal hematoma. Disc levels: Prior C3 through C7 ACDF status post C6 and C7 hardware removal. 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 Prior left laminectomy at C7. Severe left uncovertebral hypertrophy at C2-3. Upper chest: Negative. Other: None IMPRESSION: 1. No acute intracranial abnormalities. 2. Chronic microvascular disease and cerebral atrophy. 3. No evidence for cervical spine fracture or subluxation. 4. Prior C3 through C7 ACDF status post C6 and C7 hardware removal. 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 5. Unchanged chronic anterolisthesis of C2 on C3 and C7 on T1. Electronically Signed   By: Signa Kell M.D.   On: 01/28/2023 13:12   CT Cervical Spine Wo Contrast  Result Date: 01/28/2023 CLINICAL DATA:  Head trauma, minor. EXAM: CT HEAD WITHOUT CONTRAST CT CERVICAL SPINE WITHOUT CONTRAST TECHNIQUE: Multidetector CT imaging of the head and cervical spine was performed following the standard protocol without intravenous contrast. Multiplanar CT image reconstructions of the cervical spine were also generated. RADIATION DOSE REDUCTION: This exam was performed according to the departmental dose-optimization program which includes automated exposure control, adjustment of the mA and/or kV according to patient size and/or use of iterative reconstruction  technique. COMPARISON:  09/30/2021 FINDINGS: CT HEAD FINDINGS Brain: No evidence of acute infarction, hemorrhage, hydrocephalus, extra-axial collection or mass lesion/mass effect. Prominence of the sulci and ventricles. There is mild diffuse low-attenuation within the subcortical and periventricular white matter compatible with chronic microvascular disease. Vascular: No hyperdense vessel or unexpected calcification. Skull: Normal. Negative for fracture or focal lesion. Sinuses/Orbits: None Other: . CT CERVICAL SPINE FINDINGS Alignment: No acute, posttraumatic malalignment.Unchanged chronic anterolisthesis of C2 on C3 which measures 4.8 cm on today's study, image 37/9. Previously this measured the same. Anterolisthesis of C7 on T1 measures 3.8 cm, also unchanged from previous exam Skull base and vertebrae: No acute fracture. No primary bone lesion or focal pathologic process. Soft tissues and spinal canal: No prevertebral fluid or swelling. No visible canal hematoma. Disc levels: Prior C3 through C7 ACDF status post C6 and C7 hardware removal. 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 Prior left laminectomy at C7. Severe left uncovertebral hypertrophy at C2-3. Upper chest: Negative. Other: None IMPRESSION: 1. No acute intracranial abnormalities. 2. Chronic microvascular disease and cerebral atrophy. 3. No evidence for cervical spine fracture or subluxation. 4. Prior C3 through C7 ACDF status post C6 and C7 hardware removal. 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 5. Unchanged chronic anterolisthesis of C2 on C3 and C7 on T1. Electronically Signed   By: Signa Kell M.D.   On: 01/28/2023 13:12        Scheduled Meds:  acetaminophen  500 mg Oral Once   arformoterol  15 mcg Nebulization BID   And   umeclidinium bromide  1 puff Inhalation Daily   aspirin EC  325 mg Oral Q breakfast   [START ON 01/31/2023] buPROPion  150 mg Oral Daily   citalopram  20 mg Oral Daily   docusate sodium  100 mg Oral BID    feeding supplement  237 mL Oral BID BM   lamoTRIgine  300 mg Oral QHS   loratadine  10 mg Oral Daily   metoprolol succinate  100 mg Oral Daily   montelukast  10 mg  Oral QPM   multivitamin with minerals  1 tablet Oral Daily   pantoprazole  40 mg Oral Daily   Continuous Infusions:  aztreonam 2 g (01/30/23 0620)     LOS: 2 days    Time spent: 52 minutes spent on chart review, discussion with nursing staff, consultants, updating family and interview/physical exam; more than 50% of that time was spent in counseling and/or coordination of care.    Alvira Philips [place], DO [facility] Available via Epic secure chat 7am-7pm After these hours, please refer to coverage provider listed on [email or web address] 01/30/2023, 12:02 PM

## 2023-01-31 DIAGNOSIS — N390 Urinary tract infection, site not specified: Secondary | ICD-10-CM | POA: Diagnosis not present

## 2023-01-31 DIAGNOSIS — S72001A Fracture of unspecified part of neck of right femur, initial encounter for closed fracture: Secondary | ICD-10-CM | POA: Diagnosis not present

## 2023-01-31 DIAGNOSIS — B9620 Unspecified Escherichia coli [E. coli] as the cause of diseases classified elsewhere: Secondary | ICD-10-CM | POA: Diagnosis not present

## 2023-01-31 DIAGNOSIS — R7881 Bacteremia: Secondary | ICD-10-CM | POA: Diagnosis not present

## 2023-01-31 LAB — PROCALCITONIN: Procalcitonin: 2.11 ng/mL

## 2023-01-31 LAB — CULTURE, BLOOD (ROUTINE X 2): Special Requests: ADEQUATE

## 2023-01-31 LAB — CBC
HCT: 24.5 % — ABNORMAL LOW (ref 36.0–46.0)
Hemoglobin: 8.1 g/dL — ABNORMAL LOW (ref 12.0–15.0)
MCH: 30 pg (ref 26.0–34.0)
MCHC: 33.1 g/dL (ref 30.0–36.0)
MCV: 90.7 fL (ref 80.0–100.0)
Platelets: 240 10*3/uL (ref 150–400)
RBC: 2.7 MIL/uL — ABNORMAL LOW (ref 3.87–5.11)
RDW: 13.4 % (ref 11.5–15.5)
WBC: 10.7 10*3/uL — ABNORMAL HIGH (ref 4.0–10.5)
nRBC: 0 % (ref 0.0–0.2)

## 2023-01-31 LAB — BASIC METABOLIC PANEL
Anion gap: 8 (ref 5–15)
BUN: 34 mg/dL — ABNORMAL HIGH (ref 8–23)
CO2: 22 mmol/L (ref 22–32)
Calcium: 8.2 mg/dL — ABNORMAL LOW (ref 8.9–10.3)
Chloride: 104 mmol/L (ref 98–111)
Creatinine, Ser: 1.31 mg/dL — ABNORMAL HIGH (ref 0.44–1.00)
GFR, Estimated: 44 mL/min — ABNORMAL LOW (ref >60)
Glucose, Bld: 92 mg/dL (ref 70–99)
Potassium: 4.4 mmol/L (ref 3.5–5.1)
Sodium: 134 mmol/L — ABNORMAL LOW (ref 135–145)

## 2023-01-31 LAB — LACTIC ACID, PLASMA: Lactic Acid, Venous: 1 mmol/L (ref 0.5–1.9)

## 2023-01-31 LAB — CK: Total CK: 136 U/L (ref 38–234)

## 2023-01-31 MED ORDER — ENOXAPARIN SODIUM 40 MG/0.4ML IJ SOSY
40.0000 mg | PREFILLED_SYRINGE | Freq: Every day | INTRAMUSCULAR | Status: DC
  Administered 2023-01-31 – 2023-02-01 (×2): 40 mg via SUBCUTANEOUS
  Filled 2023-01-31 (×2): qty 0.4

## 2023-01-31 NOTE — Plan of Care (Signed)
  Problem: Education: Goal: Knowledge of General Education information will improve Description: Including pain rating scale, medication(s)/side effects and non-pharmacologic comfort measures Outcome: Progressing   Problem: Health Behavior/Discharge Planning: Goal: Ability to manage health-related needs will improve Outcome: Progressing   Problem: Clinical Measurements: Goal: Ability to maintain clinical measurements within normal limits will improve Outcome: Progressing Goal: Will remain free from infection Outcome: Progressing Goal: Diagnostic test results will improve Outcome: Progressing Goal: Respiratory complications will improve Outcome: Progressing Goal: Cardiovascular complication will be avoided Outcome: Progressing   Problem: Activity: Goal: Risk for activity intolerance will decrease Outcome: Progressing   Problem: Nutrition: Goal: Adequate nutrition will be maintained Outcome: Progressing   Problem: Coping: Goal: Level of anxiety will decrease Outcome: Progressing   Problem: Elimination: Goal: Will not experience complications related to bowel motility Outcome: Progressing Goal: Will not experience complications related to urinary retention Outcome: Progressing   Problem: Pain Management: Goal: General experience of comfort will improve Outcome: Progressing   Problem: Safety: Goal: Ability to remain free from injury will improve Outcome: Progressing   Problem: Skin Integrity: Goal: Risk for impaired skin integrity will decrease Outcome: Progressing   Problem: Education: Goal: Verbalization of understanding the information provided (i.e., activity precautions, restrictions, etc) will improve Outcome: Progressing Goal: Individualized Educational Video(s) Outcome: Progressing   Problem: Activity: Goal: Ability to ambulate and perform ADLs will improve Outcome: Progressing   Problem: Clinical Measurements: Goal: Postoperative complications will  be avoided or minimized Outcome: Progressing   Problem: Self-Concept: Goal: Ability to maintain and perform role responsibilities to the fullest extent possible will improve Outcome: Progressing   Problem: Pain Management: Goal: Pain level will decrease Outcome: Progressing

## 2023-01-31 NOTE — Plan of Care (Signed)
Need to not manipulate bed exit controls to go to restroom unassisted.

## 2023-01-31 NOTE — TOC Progression Note (Addendum)
Transition of Care [facility]) - Progression Note    Patient Details  Name: [name] MRN: [id number] Date of Birth: 1950-11-10  Transition of Care [facility]) CM/SW Contact  Carley Hammed, LCSW Phone Number: 01/31/2023, 12:35 PM  Clinical Narrative:     CSW met with pt at bedside and provided bed offers. Pt asking if there are any additional offers, CSW advised that her insurance is only in network with a few facilities. Pt states she would like time to review offers, CSW advised that pt is coming up on being medically ready for DC, pt notes understanding. TOC will continue to follow.   3:30 CSW met with pt at bedside, pt has picked [facility]. CSW requested for the authorization to be started. Facility notified. TOC will continue to follow.   3:40 Auth approved 10/30 - 11/5, certification # [id number]  PASRR [id number] Expected Discharge Plan: Skilled Nursing Facility Barriers to Discharge: Continued Medical Work up, English as a second language teacher, SNF Pending bed offer  Expected Discharge Plan and Services     Post Acute Care Choice: Skilled Nursing Facility Living arrangements for the past 2 months: Single Family Home                                       Social Determinants of Health (SDOH) Interventions SDOH Screenings   Food Insecurity: No Food Insecurity (01/28/2023)  Housing: Low Risk  (01/28/2023)  Transportation Needs: No Transportation Needs (01/28/2023)  Utilities: Not At Risk (01/28/2023)  Alcohol Screen: Low Risk  (03/11/2020)  Depression (PHQ2-9): Medium Risk (01/19/2023)  Financial Resource Strain: Low Risk  (06/24/2021)  Physical Activity: Inactive (06/24/2021)  Social Connections: Socially Isolated (06/24/2021)  Stress: No Stress Concern Present (06/24/2021)  Tobacco Use: Medium Risk (01/28/2023)    Readmission Risk Interventions    01/20/2022   11:59 AM  Readmission Risk Prevention Plan  Transportation Screening Complete  PCP or Specialist Appt  within 5-7 Days Complete  Home Care Screening Complete  Medication Review (RN CM) Complete

## 2023-01-31 NOTE — Progress Notes (Signed)
PROGRESS NOTE    Erin Lopez  NWG:[id number] DOB: [birth year] DOA: 01/28/2023 PCP: Jeoffrey Massed, MD    Brief Narrative:   Erin Lopez is a 72 y.o. female with past medical history significant for HTN, HLD, COPD, CKD stage II, history of TIA, bipolar disorder, GERD, cognitive impairment who presented to [facility] ED on 10/26 from home via EMS with right hip pain following fall.  Patient reports approximately 2200 on 01/27/2023 with sudden onset dizziness causing fall.  Reports struck head on a door frame, unsure of loss of consciousness.  Denies blood thinner use.  Following fall had right leg pain and unable to ambulate.  Unable to lift leg while lying flat.  Eventually patient was found and brought to the ED for further evaluation after EMS activated.  Does report some subjective chills and fever.  Reports recent treatment for pneumonia.  Denies chest pain, no shortness of breath, no abdominal pain, no nausea/vomiting/diarrhea, no cough.  In the ED, temperature 98.4 F, HR 132, RR 24, BP 126/58, SpO2 98% on room air.  WBC 12.5, hemoglobin 9.9, platelet count 200.  Sodium 127, potassium 3.3, chloride 94, CO2 21, glucose 140, BUN 29, creat 1.58.  AST 44, ALT 31, total bilirubin 1.4.  CK 1118.  INR 1.1.  COVID/influenza/RSV PCR negative.  Urinalysis with moderate leukocytes, positive nitrite, few bacteria, 11-20 WBCs.  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  Right hip/pelvis x-ray with minimally displaced/impacted right femoral neck fracture. Patient received 1 L IV fluids and aztreonam in the ED. Aztreonam selected due to patient history of anaphylaxis to cephalosporin and quinolone. Orthopedics consulted in the ED.   Assessment & Plan:   Severe sepsis, POA E. coli  septicemia E. coli urinary tract infection Lactic acidosis Patient presenting to ED following fall after she became dizzy at home.  Patient was tachycardic, tachypneic with elevated WBC count and urinalysis consistent with urinary tract infection. -- WBC 12.5>9.8>15.3>10.7 -- Urine culture: + > 100K Ecoli -- Blood cultures x 2:  + Ecoli -- Aztreonam 2 g IV every 8 hours; plan 7-day course of antibiotics (allergy to cephalosporin/quinolones)  Right femoral neck fracture Patient with fall at home. Right hip/pelvis x-ray with minimally displaced/impacted right femoral neck fracture.  Orthopedics was consulted and patient underwent ORIF by Dr. Aundria Rud on 01/28/2023. -- WBAT RLE. -- Tylenol 600 mg p.o. every 6 hours as needed mild pain -- Norco 5-325 mg 1-2 tablets every 6 hours as needed moderate/severe pain -- PT/OT recommend SNF placement, TOC following -- Outpatient follow-up with orthopedics 2 weeks for staple removal  Acute renal failure on CKD stage II Hyponatremia Creatinine elevated 1.58 from a baseline of 1.0.  Likely secondary to dehydration from prolonged downtime, n.p.o. status for surgery. -- Cr 1.58>1.43>1.95>1.31 -- IVF hydration with NS at 75 mL/h -- repeat BMP in am  Hypokalemia: Resolved Potassium repleted.  Repeat K 4.4 this morning.  Essential hypertension -- Metoprolol succinate 100 mg p.o. daily -- Continue aspirin and statin  Hyperlipidemia -- Atorvastatin  COPD -- Singulair 10 mg p.o. nightly -- Brovana neb twice daily -- Incruse Ellipta 1 puff daily  History of TIA -- Continue aspirin and statin  GERD -- Protonix 40 mg p.o. daily  Bipolar disorder Anxiety/depression -- Celexa 20 mg p.o. daily -- Bupropion 3 mg p.o. daily --  Lamictal 3 mg p.o. nightly -- Xanax 0.5 mg p.o. 3 times daily as needed anxiety    DVT prophylaxis: enoxaparin (LOVENOX) injection 40 mg Start: 01/31/23 1215 SCDs Start: 01/28/23 2259 SCDs Start: 01/28/23 1410    Code  Status: Full Code Family Communication: No family present at bedside this morning  Disposition Plan:  Level of care: Med-Surg Status is: Inpatient Remains inpatient appropriate because: IV antibiotics, will need SNF placement, TOC consulted    Consultants:  Orthopedics, Dr. Aundria Rud  Procedures:  ORIF right hip fracture, Dr. Aundria Rud 10/26  Antimicrobials:  Perioperative cefazolin   Subjective: Patient seen examined bedside, resting calmly.  Lying in bed.  Remains on IV antibiotics, will need 7-day course for E. coli septicemia with poor oral options given her allergy profile.  TOC consulted for SNF placement.  No specific complaints, concerns or questions this morning.   Denies headache, no visual changes, no chest pain, no palpitations, no shortness of breath, no abdominal pain, no fever/chills/night sweats, no nausea/vomiting/diarrhea, no focal weakness, no fatigue, no paresthesias.  No acute events overnight per nursing staff.  Objective: Vitals:   01/31/23 0616 01/31/23 0820 01/31/23 0832 01/31/23 0834  BP: 112/65 (!) 148/86    Pulse: 80 85    Resp: 18 17    Temp: (!) 97.5 F (36.4 C) 98.2 F (36.8 C)    TempSrc: Oral     SpO2: 95% 93% 95% 95%  Weight:      Height:        Intake/Output Summary (Last 24 hours) at 01/31/2023 1159 Last data filed at 01/31/2023 0900 Gross per 24 hour  Intake 960 ml  Output --  Net 960 ml   Filed Weights   01/28/23 1048  Weight: 78.5 kg    Examination:  Physical Exam: GEN: NAD, alert and oriented x 3, wd/wn HEENT: NCAT, PERRL, EOMI, sclera clear, MMM PULM: CTAB w/o wheezes/crackles, normal respiratory effort, on room air CV: RRR w/o M/G/R GI: abd soft, NTND, NABS, no R/G/M MSK: no peripheral edema, moves all extremities and implant, noted surgical dressing in place, clean/dry/intact NEURO: CN II-XII intact, no focal deficits, sensation to light touch intact PSYCH: normal mood/affect Integumentary: Noted surgical dressing right  lower extremity as above, otherwise no other concerning rashes/lesions/wounds on exposed skin surfaces.    Data Reviewed: I have personally reviewed following labs and imaging studies  CBC: Recent Labs  Lab 01/28/23 1050 01/28/23 1945 01/29/23 0931 01/30/23 0907 01/31/23 0728  WBC 12.5*  --  9.8 15.3* 10.7*  NEUTROABS 10.9*  --   --   --   --   HGB 9.9* 9.2* 8.6* 8.8* 8.1*  HCT 29.8* 27.0* 25.1* 26.3* 24.5*  MCV 89.0  --  88.4 91.0 90.7  PLT 200  --  175 222 240   Basic Metabolic Panel: Recent Labs  Lab 01/28/23 1050 01/28/23 1945 01/29/23 0931 01/30/23 0907 01/31/23 0728  NA 127* 129* 131* 130* 134*  K 3.3* 3.7 4.1 4.1 4.4  CL 94*  --  101 101 104  CO2 21*  --  22 22 22   GLUCOSE 140*  --  159* 144* 92  BUN 29*  --  28* 44* 34*  CREATININE 1.58*  --  1.43* 1.95* 1.31*  CALCIUM 8.4*  --  7.9* 8.3* 8.2*  MG  --   --   --  2.5*  --    GFR: Estimated Creatinine Clearance: 43.1 mL/min (A) (by C-G formula based on SCr of 1.31 mg/dL (H)).  Liver Function Tests: Recent Labs  Lab 01/28/23 1050 01/30/23 0907  AST 44* 64*  ALT 31 38  ALKPHOS 82 83  BILITOT 1.4* 0.5  PROT 6.0* 5.6*  ALBUMIN 3.4* 2.8*   No results for input(s): "LIPASE", "AMYLASE" in the last 168 hours. No results for input(s): "AMMONIA" in the last 168 hours. Coagulation Profile: Recent Labs  Lab 01/28/23 1050  INR 1.1   Cardiac Enzymes: Recent Labs  Lab 01/28/23 1050 01/29/23 0931 01/30/23 0907  CKTOTAL 1,118* 1,712* 598*   BNP (last 3 results) No results for input(s): "PROBNP" in the last 8760 hours. HbA1C: No results for input(s): "HGBA1C" in the last 72 hours. CBG: No results for input(s): "GLUCAP" in the last 168 hours. Lipid Profile: No results for input(s): "CHOL", "HDL", "LDLCALC", "TRIG", "CHOLHDL", "LDLDIRECT" in the last 72 hours. Thyroid Function Tests: No results for input(s): "TSH", "T4TOTAL", "FREET4", "T3FREE", "THYROIDAB" in the last 72 hours. Anemia Panel: No  results for input(s): "VITAMINB12", "FOLATE", "FERRITIN", "TIBC", "IRON", "RETICCTPCT" in the last 72 hours. Sepsis Labs: Recent Labs  Lab 01/28/23 1146 01/28/23 1327 01/30/23 0907 01/31/23 0728  PROCALCITON  --   --   --  2.11  LATICACIDVEN 2.2* 2.1* 2.2* 1.0    Recent Results (from the past 240 hour(s))  Resp panel by RT-PCR (RSV, Flu A&B, Covid) Anterior Nasal Swab     Status: None   Collection Time: 01/28/23 11:14 AM   Specimen: Anterior Nasal Swab  Result Value Ref Range Status   SARS Coronavirus 2 by RT PCR NEGATIVE NEGATIVE Final   Influenza A by PCR NEGATIVE NEGATIVE Final   Influenza B by PCR NEGATIVE NEGATIVE Final    Comment: (NOTE) The Xpert Xpress SARS-CoV-2/FLU/RSV plus assay is intended as an aid in the diagnosis of influenza from Nasopharyngeal swab specimens and should not be used as a sole basis for treatment. Nasal washings and aspirates are unacceptable for Xpert Xpress SARS-CoV-2/FLU/RSV testing.  Fact Sheet for Patients: BloggerCourse.com  Fact Sheet for Healthcare Providers: SeriousBroker.it  This test is not yet approved or cleared by the [place] FDA and has been authorized for detection and/or diagnosis of SARS-CoV-2 by FDA under an Emergency Use Authorization (EUA). This EUA will remain in effect (meaning this test can be used) for the duration of the COVID-19 declaration under Section 564(b)(1) of the Act, 21 U.S.C. section 360bbb-3(b)(1), unless the authorization is terminated or revoked.     Resp Syncytial Virus by PCR NEGATIVE NEGATIVE Final    Comment: (NOTE) Fact Sheet for Patients: BloggerCourse.com  Fact Sheet for Healthcare Providers: SeriousBroker.it  This test is not yet approved or cleared by the [place] FDA and has been authorized for detection and/or diagnosis of SARS-CoV-2 by FDA under an Emergency Use Authorization  (EUA). This EUA will remain in effect (meaning this test can be used) for the duration of the COVID-19 declaration under Section 564(b)(1) of the Act, 21 U.S.C. section 360bbb-3(b)(1), unless the authorization is terminated or revoked.  Performed at [facility], [address], [place], [place] [address]   Blood Culture (routine x 2)     Status: Abnormal   Collection Time: 01/28/23 11:18 AM   Specimen: BLOOD  Result Value Ref Range Status   Specimen Description BLOOD RIGHT ANTECUBITAL  Final   Special Requests   Final    BOTTLES DRAWN AEROBIC AND ANAEROBIC Blood Culture adequate volume   Culture  Setup Time   Final    GRAM NEGATIVE RODS IN  BOTH AEROBIC AND ANAEROBIC BOTTLES CRITICAL VALUE NOTED.  VALUE IS CONSISTENT WITH PREVIOUSLY REPORTED AND CALLED VALUE.    Culture (A)  Final    ESCHERICHIA COLI SUSCEPTIBILITIES PERFORMED ON PREVIOUS CULTURE WITHIN THE LAST 5 DAYS. Performed at [facility], [address], [place], [place] [address]    Report Status 01/31/2023 FINAL  Final  Urine Culture     Status: Abnormal   Collection Time: 01/28/23 11:23 AM   Specimen: Urine, Random  Result Value Ref Range Status   Specimen Description URINE, RANDOM  Final   Special Requests   Final    NONE Reflexed from [id number] Performed at [facility], [address], [place], [place] [address]    Culture >=100,000 COLONIES/mL ESCHERICHIA COLI (A)  Final   Report Status 01/30/2023 FINAL  Final   Organism ID, Bacteria ESCHERICHIA COLI (A)  Final      Susceptibility   Escherichia coli - MIC*    AMPICILLIN >=32 RESISTANT Resistant     CEFAZOLIN <=4 SENSITIVE Sensitive     CEFEPIME <=0.12 SENSITIVE Sensitive     CEFTRIAXONE <=0.25 SENSITIVE Sensitive     CIPROFLOXACIN <=0.25 SENSITIVE Sensitive     GENTAMICIN <=1 SENSITIVE Sensitive     IMIPENEM <=0.25 SENSITIVE Sensitive     NITROFURANTOIN <=16 SENSITIVE Sensitive     TRIMETH/SULFA <=20 SENSITIVE Sensitive      AMPICILLIN/SULBACTAM 16 INTERMEDIATE Intermediate     PIP/TAZO <=4 SENSITIVE Sensitive ug/mL    * >=100,000 COLONIES/mL ESCHERICHIA COLI  Blood Culture (routine x 2)     Status: Abnormal   Collection Time: 01/28/23 11:41 AM   Specimen: BLOOD RIGHT ARM  Result Value Ref Range Status   Specimen Description BLOOD RIGHT ARM  Final   Special Requests   Final    BOTTLES DRAWN AEROBIC AND ANAEROBIC Blood Culture adequate volume   Culture  Setup Time   Final    GRAM NEGATIVE RODS IN BOTH AEROBIC AND ANAEROBIC BOTTLES CRITICAL RESULT CALLED TO, READ BACK BY AND VERIFIED WITH: V BRYK,PHARMD@[email or web address]  01/29/23 MK Performed at [facility], [address], [place], [place] [address]    Culture ESCHERICHIA COLI (A)  Final   Report Status 01/31/2023 FINAL  Final   Organism ID, Bacteria ESCHERICHIA COLI  Final      Susceptibility   Escherichia coli - MIC*    AMPICILLIN >=32 RESISTANT Resistant     CEFEPIME <=0.12 SENSITIVE Sensitive     CEFTAZIDIME <=1 SENSITIVE Sensitive     CEFTRIAXONE <=0.25 SENSITIVE Sensitive     CIPROFLOXACIN <=0.25 SENSITIVE Sensitive     GENTAMICIN <=1 SENSITIVE Sensitive     IMIPENEM <=0.25 SENSITIVE Sensitive     TRIMETH/SULFA <=20 SENSITIVE Sensitive     AMPICILLIN/SULBACTAM 16 INTERMEDIATE Intermediate     PIP/TAZO <=4 SENSITIVE Sensitive ug/mL    * ESCHERICHIA COLI  Blood Culture ID Panel (Reflexed)     Status: Abnormal   Collection Time: 01/28/23 11:41 AM  Result Value Ref Range Status   Enterococcus faecalis NOT DETECTED NOT DETECTED Final   Enterococcus Faecium NOT DETECTED NOT DETECTED Final   Listeria monocytogenes NOT DETECTED NOT DETECTED Final   Staphylococcus species NOT DETECTED NOT DETECTED Final   Staphylococcus aureus (BCID) NOT DETECTED NOT DETECTED Final   Staphylococcus epidermidis NOT DETECTED NOT DETECTED Final   Staphylococcus lugdunensis NOT DETECTED NOT DETECTED Final   Streptococcus species NOT DETECTED NOT DETECTED Final    Streptococcus agalactiae NOT DETECTED NOT DETECTED  Final   Streptococcus pneumoniae NOT DETECTED NOT DETECTED Final   Streptococcus pyogenes NOT DETECTED NOT DETECTED Final   A.calcoaceticus-baumannii NOT DETECTED NOT DETECTED Final   Bacteroides fragilis NOT DETECTED NOT DETECTED Final   Enterobacterales DETECTED (A) NOT DETECTED Final    Comment: Enterobacterales represent a large order of gram negative bacteria, not a single organism. CRITICAL RESULT CALLED TO, READ BACK BY AND VERIFIED WITH: V BRYK,PHARMD@0435  01/29/23 MK    Enterobacter cloacae complex NOT DETECTED NOT DETECTED Final   Escherichia coli DETECTED (A) NOT DETECTED Final    Comment: CRITICAL RESULT CALLED TO, READ BACK BY AND VERIFIED WITH: V BRYK,PHARMD@0435  01/29/23 MK    Klebsiella aerogenes NOT DETECTED NOT DETECTED Final   Klebsiella oxytoca NOT DETECTED NOT DETECTED Final   Klebsiella pneumoniae NOT DETECTED NOT DETECTED Final   Proteus species NOT DETECTED NOT DETECTED Final   Salmonella species NOT DETECTED NOT DETECTED Final   Serratia marcescens NOT DETECTED NOT DETECTED Final   Haemophilus influenzae NOT DETECTED NOT DETECTED Final   Neisseria meningitidis NOT DETECTED NOT DETECTED Final   Pseudomonas aeruginosa NOT DETECTED NOT DETECTED Final   Stenotrophomonas maltophilia NOT DETECTED NOT DETECTED Final   Candida albicans NOT DETECTED NOT DETECTED Final   Candida auris NOT DETECTED NOT DETECTED Final   Candida glabrata NOT DETECTED NOT DETECTED Final   Candida krusei NOT DETECTED NOT DETECTED Final   Candida parapsilosis NOT DETECTED NOT DETECTED Final   Candida tropicalis NOT DETECTED NOT DETECTED Final   Cryptococcus neoformans/gattii NOT DETECTED NOT DETECTED Final   CTX-M ESBL NOT DETECTED NOT DETECTED Final   Carbapenem resistance IMP NOT DETECTED NOT DETECTED Final   Carbapenem resistance KPC NOT DETECTED NOT DETECTED Final   Carbapenem resistance NDM NOT DETECTED NOT DETECTED Final    Carbapenem resist OXA 48 LIKE NOT DETECTED NOT DETECTED Final   Carbapenem resistance VIM NOT DETECTED NOT DETECTED Final    Comment: Performed at [facility], [address], [place], [place] [address]         Radiology Studies: No results found.      Scheduled Meds:  acetaminophen  500 mg Oral Once   arformoterol  15 mcg Nebulization BID   And   umeclidinium bromide  1 puff Inhalation Daily   aspirin EC  325 mg Oral Q breakfast   buPROPion  150 mg Oral Daily   citalopram  20 mg Oral Daily   docusate sodium  100 mg Oral BID   enoxaparin (LOVENOX) injection  40 mg Subcutaneous Daily   feeding supplement  237 mL Oral BID BM   lamoTRIgine  300 mg Oral QHS   loratadine  10 mg Oral Daily   metoprolol succinate  100 mg Oral Daily   montelukast  10 mg Oral QPM   multivitamin with minerals  1 tablet Oral Daily   pantoprazole  40 mg Oral Daily   Continuous Infusions:  sodium chloride 100 mL/hr at 01/30/23 2053   aztreonam 2 g (01/31/23 0558)     LOS: 3 days    Time spent: 52 minutes spent on chart review, discussion with nursing staff, consultants, updating family and interview/physical exam; more than 50% of that time was spent in counseling and/or coordination of care.    Alvira Philips [place], DO [facility] Available via Epic secure chat 7am-7pm After these hours, please refer to coverage provider listed on [email or web address] 01/31/2023, 11:59 AM

## 2023-01-31 NOTE — Anesthesia Postprocedure Evaluation (Signed)
Anesthesia Post Note  Patient: Erin Lopez  Procedure(s) Performed: PERCUTANEOUS FIXATION OF RIGHT FEMORAL NECK (Right: Hip)     Patient location during evaluation: PACU Anesthesia Type: General Level of consciousness: sedated and patient cooperative Pain management: pain level controlled Vital Signs Assessment: post-procedure vital signs reviewed and stable Respiratory status: spontaneous breathing Cardiovascular status: stable Anesthetic complications: no   No notable events documented.  Last Vitals:  Vitals:   01/31/23 0832 01/31/23 0834  BP:    Pulse:    Resp:    Temp:    SpO2: 95% 95%    Last Pain:  Vitals:   01/31/23 0616  TempSrc: Oral  PainSc:                  Lewie Loron

## 2023-01-31 NOTE — Progress Notes (Signed)
Physical Therapy Treatment Patient Details Name: Erin Lopez MRN: [id number] DOB: May 24, 1950 Today's Date: 01/31/2023   History of Present Illness 72 y.o. female presenting to ED 10/26 after a fall at home. Found to have R femoral neck fx UTI, AKI on CKD2, tachycardia with Qtc prolongation. S/p 10/26 R Proximal femur ORIF WBAT PMH: hypertension, hyperlipidemia, COPD, CKD 2, history of TIA, GERD, bipolar disorder, memory difficulties    PT Comments  Pt making good progress but does need min A at times and with decreased safety awareness requiring cues.  She ambulated 79' with RW and min A.  Pt reporting pain at 8/10 but did not show signs of severe pain and tolerated therapy well, RN provided meds during session.  Continue to recommend Patient will benefit from continued inpatient follow up therapy, <3 hours/day at d/c as pt lives alone.     If plan is discharge home, recommend the following: A little help with walking and/or transfers;A little help with bathing/dressing/bathroom;Assistance with cooking/housework;Assist for transportation;Help with stairs or ramp for entrance   Can travel by private vehicle     Yes  Equipment Recommendations  None recommended by PT    Recommendations for Other Services       Precautions / Restrictions Precautions Precautions: Fall Restrictions RLE Weight Bearing: Weight bearing as tolerated LLE Weight Bearing: Weight bearing as tolerated     Mobility  Bed Mobility Overal bed mobility: Needs Assistance Bed Mobility: Supine to Sit     Supine to sit: Supervision     General bed mobility comments: cueing for safety    Transfers Overall transfer level: Needs assistance Equipment used: Rolling walker (2 wheels) Transfers: Sit to/from Stand Sit to Stand: Contact guard assist           General transfer comment: cueing for hand placement and safety (waiting for RW); performed x 2    Ambulation/Gait Ambulation/Gait assistance: Min  assist Gait Distance (Feet): 80 Feet Assistive device: Rolling walker (2 wheels) Gait Pattern/deviations: Step-to pattern, Decreased stride length, Decreased weight shift to right Gait velocity: decreased     General Gait Details: Tolerated well; min A for RW in tight areas   Stairs             Wheelchair Mobility     Tilt Bed    Modified Rankin (Stroke Patients Only)       Balance Overall balance assessment: Needs assistance Sitting-balance support: Feet supported, No upper extremity supported Sitting balance-Leahy Scale: Good     Standing balance support: Bilateral upper extremity supported Standing balance-Leahy Scale: Poor Standing balance comment: Requiring RW; steady with RW                            Cognition Arousal: Alert Behavior During Therapy: WFL for tasks assessed/performed Overall Cognitive Status: No family/caregiver present to determine baseline cognitive functioning                                 General Comments: pt with decreased attention and problem solving and awareness; makes confused statements at times        Exercises Total Joint Exercises Ankle Circles/Pumps: AROM, Both, 10 reps, Seated Quad Sets: AROM, Both, 10 reps, Seated Heel Slides: AROM, Both, 10 reps, Supine Long Arc Quad: AROM, Both, 10 reps, 20 reps Knee Flexion: AROM, Both, 10 reps, 20 reps General Exercises - Lower Extremity  Hip Flexion/Marching: AROM, Both, 20 reps, Seated    General Comments General comments (skin integrity, edema, etc.): VSS on RA      Pertinent Vitals/Pain Pain Assessment Pain Assessment: 0-10 Pain Score: 8  Pain Location: R hip Pain Descriptors / Indicators: Discomfort Pain Intervention(s): Limited activity within patient's tolerance, Monitored during session, Repositioned, Ice applied, RN gave pain meds during session (med prescribed every 6 hr; reports 8/10 pain but not overly symptomatic)    Home Living                           Prior Function            PT Goals (current goals can now be found in the care plan section) Progress towards PT goals: Progressing toward goals    Frequency    Min 1X/week      PT Plan      Co-evaluation              AM-PAC PT "6 Clicks" Mobility   Outcome Measure  Help needed turning from your back to your side while in a flat bed without using bedrails?: A Little Help needed moving from lying on your back to sitting on the side of a flat bed without using bedrails?: A Little Help needed moving to and from a bed to a chair (including a wheelchair)?: A Little Help needed standing up from a chair using your arms (e.g., wheelchair or bedside chair)?: A Little Help needed to walk in hospital room?: A Little Help needed climbing 3-5 steps with a railing? : A Lot 6 Click Score: 17    End of Session Equipment Utilized During Treatment: Gait belt Activity Tolerance: Patient tolerated treatment well Patient left: in chair;with call bell/phone within reach;with chair alarm set Nurse Communication: Mobility status PT Visit Diagnosis: Unsteadiness on feet (R26.81);Other abnormalities of gait and mobility (R26.89);History of falling (Z91.81);Muscle weakness (generalized) (M62.81);Difficulty in walking, not elsewhere classified (R26.2);Pain Pain - Right/Left: Left Pain - part of body: Hip     Time: 7846-9629 PT Time Calculation (min) (ACUTE ONLY): 26 min  Charges:    $Gait Training: 8-22 mins $Therapeutic Exercise: 8-22 mins PT General Charges $$ ACUTE PT VISIT: 1 Visit                     Anise Salvo, PT Acute Rehab [facility] [phone]    Rayetta Humphrey 01/31/2023, 5:38 PM

## 2023-01-31 NOTE — Care Management Important Message (Signed)
Important Message  Patient Details  Name: ENDIA PRIER MRN: [id number] Date of Birth: 07-05-1950   Important Message Given:  Yes - Medicare IM     Sherilyn Banker 01/31/2023, 12:11 PM

## 2023-02-01 DIAGNOSIS — E785 Hyperlipidemia, unspecified: Secondary | ICD-10-CM | POA: Diagnosis not present

## 2023-02-01 DIAGNOSIS — Z743 Need for continuous supervision: Secondary | ICD-10-CM | POA: Diagnosis not present

## 2023-02-01 DIAGNOSIS — S7291XD Unspecified fracture of right femur, subsequent encounter for closed fracture with routine healing: Secondary | ICD-10-CM | POA: Diagnosis not present

## 2023-02-01 DIAGNOSIS — I150 Renovascular hypertension: Secondary | ICD-10-CM | POA: Diagnosis not present

## 2023-02-01 DIAGNOSIS — E871 Hypo-osmolality and hyponatremia: Secondary | ICD-10-CM | POA: Diagnosis not present

## 2023-02-01 DIAGNOSIS — F319 Bipolar disorder, unspecified: Secondary | ICD-10-CM | POA: Diagnosis not present

## 2023-02-01 DIAGNOSIS — S72001D Fracture of unspecified part of neck of right femur, subsequent encounter for closed fracture with routine healing: Secondary | ICD-10-CM | POA: Diagnosis not present

## 2023-02-01 DIAGNOSIS — R9431 Abnormal electrocardiogram [ECG] [EKG]: Secondary | ICD-10-CM | POA: Diagnosis not present

## 2023-02-01 DIAGNOSIS — G4700 Insomnia, unspecified: Secondary | ICD-10-CM | POA: Diagnosis not present

## 2023-02-01 DIAGNOSIS — F1320 Sedative, hypnotic or anxiolytic dependence, uncomplicated: Secondary | ICD-10-CM | POA: Diagnosis not present

## 2023-02-01 DIAGNOSIS — K219 Gastro-esophageal reflux disease without esophagitis: Secondary | ICD-10-CM | POA: Diagnosis not present

## 2023-02-01 DIAGNOSIS — Z8673 Personal history of transient ischemic attack (TIA), and cerebral infarction without residual deficits: Secondary | ICD-10-CM | POA: Diagnosis not present

## 2023-02-01 DIAGNOSIS — N182 Chronic kidney disease, stage 2 (mild): Secondary | ICD-10-CM | POA: Diagnosis not present

## 2023-02-01 DIAGNOSIS — A4151 Sepsis due to Escherichia coli [E. coli]: Secondary | ICD-10-CM

## 2023-02-01 DIAGNOSIS — R6520 Severe sepsis without septic shock: Secondary | ICD-10-CM

## 2023-02-01 DIAGNOSIS — I10 Essential (primary) hypertension: Secondary | ICD-10-CM | POA: Diagnosis not present

## 2023-02-01 DIAGNOSIS — J449 Chronic obstructive pulmonary disease, unspecified: Secondary | ICD-10-CM | POA: Diagnosis not present

## 2023-02-01 DIAGNOSIS — S72001A Fracture of unspecified part of neck of right femur, initial encounter for closed fracture: Secondary | ICD-10-CM | POA: Diagnosis not present

## 2023-02-01 DIAGNOSIS — B9620 Unspecified Escherichia coli [E. coli] as the cause of diseases classified elsewhere: Secondary | ICD-10-CM | POA: Diagnosis not present

## 2023-02-01 DIAGNOSIS — N390 Urinary tract infection, site not specified: Secondary | ICD-10-CM | POA: Diagnosis not present

## 2023-02-01 DIAGNOSIS — R413 Other amnesia: Secondary | ICD-10-CM | POA: Diagnosis not present

## 2023-02-01 DIAGNOSIS — Z7401 Bed confinement status: Secondary | ICD-10-CM | POA: Diagnosis not present

## 2023-02-01 DIAGNOSIS — N179 Acute kidney failure, unspecified: Secondary | ICD-10-CM | POA: Diagnosis not present

## 2023-02-01 DIAGNOSIS — G3184 Mild cognitive impairment, so stated: Secondary | ICD-10-CM | POA: Diagnosis not present

## 2023-02-01 DIAGNOSIS — Z9181 History of falling: Secondary | ICD-10-CM | POA: Diagnosis not present

## 2023-02-01 DIAGNOSIS — R748 Abnormal levels of other serum enzymes: Secondary | ICD-10-CM | POA: Diagnosis not present

## 2023-02-01 DIAGNOSIS — Z87448 Personal history of other diseases of urinary system: Secondary | ICD-10-CM | POA: Diagnosis not present

## 2023-02-01 DIAGNOSIS — A419 Sepsis, unspecified organism: Secondary | ICD-10-CM | POA: Diagnosis not present

## 2023-02-01 DIAGNOSIS — E7800 Pure hypercholesterolemia, unspecified: Secondary | ICD-10-CM | POA: Diagnosis not present

## 2023-02-01 LAB — CULTURE, BLOOD (ROUTINE X 2): Special Requests: ADEQUATE

## 2023-02-01 LAB — BASIC METABOLIC PANEL
Anion gap: 7 (ref 5–15)
BUN: 16 mg/dL (ref 8–23)
CO2: 26 mmol/L (ref 22–32)
Calcium: 8.8 mg/dL — ABNORMAL LOW (ref 8.9–10.3)
Chloride: 104 mmol/L (ref 98–111)
Creatinine, Ser: 0.87 mg/dL (ref 0.44–1.00)
GFR, Estimated: >60 mL/min (ref >60)
Glucose, Bld: 94 mg/dL (ref 70–99)
Potassium: 4.2 mmol/L (ref 3.5–5.1)
Sodium: 137 mmol/L (ref 135–145)

## 2023-02-01 LAB — CBC
HCT: 27.1 % — ABNORMAL LOW (ref 36.0–46.0)
Hemoglobin: 8.8 g/dL — ABNORMAL LOW (ref 12.0–15.0)
MCH: 29.1 pg (ref 26.0–34.0)
MCHC: 32.5 g/dL (ref 30.0–36.0)
MCV: 89.7 fL (ref 80.0–100.0)
Platelets: 283 10*3/uL (ref 150–400)
RBC: 3.02 MIL/uL — ABNORMAL LOW (ref 3.87–5.11)
RDW: 13.3 % (ref 11.5–15.5)
WBC: 10.2 10*3/uL (ref 4.0–10.5)
nRBC: 0 % (ref 0.0–0.2)

## 2023-02-01 LAB — MAGNESIUM: Magnesium: 1.7 mg/dL (ref 1.7–2.4)

## 2023-02-01 MED ORDER — SULFAMETHOXAZOLE-TRIMETHOPRIM 800-160 MG PO TABS: 1.0000 | ORAL_TABLET | Freq: Two times a day (BID) | ORAL | 0 refills | Status: AC

## 2023-02-01 MED ORDER — SULFAMETHOXAZOLE-TRIMETHOPRIM 800-160 MG PO TABS: 1.0000 | ORAL_TABLET | Freq: Two times a day (BID) | ORAL | Status: DC

## 2023-02-01 NOTE — Plan of Care (Signed)
  Problem: Education: Goal: Knowledge of General Education information will improve Description: Including pain rating scale, medication(s)/side effects and non-pharmacologic comfort measures Outcome: Progressing   Problem: Health Behavior/Discharge Planning: Goal: Ability to manage health-related needs will improve Outcome: Progressing   Problem: Clinical Measurements: Goal: Ability to maintain clinical measurements within normal limits will improve Outcome: Progressing Goal: Will remain free from infection Outcome: Progressing Goal: Respiratory complications will improve Outcome: Progressing   Problem: Activity: Goal: Risk for activity intolerance will decrease Outcome: Progressing   Problem: Nutrition: Goal: Adequate nutrition will be maintained Outcome: Progressing   Problem: Coping: Goal: Level of anxiety will decrease Outcome: Progressing   Problem: Pain Management: Goal: General experience of comfort will improve Outcome: Progressing   Problem: Safety: Goal: Ability to remain free from injury will improve Outcome: Progressing   Problem: Activity: Goal: Ability to ambulate and perform ADLs will improve Outcome: Progressing   Problem: Clinical Measurements: Goal: Postoperative complications will be avoided or minimized Outcome: Progressing   Problem: Self-Concept: Goal: Ability to maintain and perform role responsibilities to the fullest extent possible will improve Outcome: Progressing   Problem: Pain Management: Goal: Pain level will decrease Outcome: Progressing

## 2023-02-01 NOTE — Plan of Care (Signed)
  Problem: Education: Goal: Knowledge of General Education information will improve Description: Including pain rating scale, medication(s)/side effects and non-pharmacologic comfort measures Outcome: Progressing   Problem: Health Behavior/Discharge Planning: Goal: Ability to manage health-related needs will improve Outcome: Progressing   Problem: Clinical Measurements: Goal: Ability to maintain clinical measurements within normal limits will improve Outcome: Progressing Goal: Will remain free from infection Outcome: Progressing Goal: Diagnostic test results will improve Outcome: Progressing Goal: Respiratory complications will improve Outcome: Progressing Goal: Cardiovascular complication will be avoided Outcome: Progressing   Problem: Activity: Goal: Risk for activity intolerance will decrease Outcome: Progressing   Problem: Nutrition: Goal: Adequate nutrition will be maintained Outcome: Progressing   Problem: Coping: Goal: Level of anxiety will decrease Outcome: Progressing   Problem: Elimination: Goal: Will not experience complications related to bowel motility Outcome: Progressing Goal: Will not experience complications related to urinary retention Outcome: Progressing   Problem: Pain Management: Goal: General experience of comfort will improve Outcome: Progressing   Problem: Safety: Goal: Ability to remain free from injury will improve Outcome: Progressing   Problem: Skin Integrity: Goal: Risk for impaired skin integrity will decrease Outcome: Progressing   Problem: Education: Goal: Verbalization of understanding the information provided (i.e., activity precautions, restrictions, etc) will improve Outcome: Progressing Goal: Individualized Educational Video(s) Outcome: Progressing   Problem: Activity: Goal: Ability to ambulate and perform ADLs will improve Outcome: Progressing   Problem: Clinical Measurements: Goal: Postoperative complications will  be avoided or minimized Outcome: Progressing   Problem: Self-Concept: Goal: Ability to maintain and perform role responsibilities to the fullest extent possible will improve Outcome: Progressing   Problem: Pain Management: Goal: Pain level will decrease Outcome: Progressing

## 2023-02-01 NOTE — Progress Notes (Signed)
   02/01/23 1000  Mobility  Activity Transferred to/from [facility];Transferred from bed to chair  Level of Assistance Contact guard assist, steadying assist  Assistive Device Front wheel walker  Distance Ambulated (ft) 5 ft  RLE Weight Bearing WBAT  LLE Weight Bearing WBAT  Activity Response Tolerated fair  Mobility Referral Yes  $Mobility charge 1 Mobility  Mobility Specialist Start Time (ACUTE ONLY) 0915  Mobility Specialist Stop Time (ACUTE ONLY) 1000  Mobility Specialist Time Calculation (min) (ACUTE ONLY) 45 min   Mobility Specialist: Progress Note  Pt agreeable to mobility session - received in bed. Required CG throughout using RW. C/o L hip pain. Returned to chair with all needs met - call bell within reach. Chair alarm on.   Barnie Mort, BS Mobility Specialist Please contact via SecureChat or Rehab office at [phone].
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
## 2023-02-01 NOTE — Progress Notes (Signed)
Miss Strutz requesting a shower at this time. Was told that nursing staff would speak to the Dr regarding an order to shower. States that the Dr gave her permission for a shower and so 'it needs to be done'. No order in chart for a shower. Dr Natale Milch made aware.

## 2023-02-01 NOTE — Progress Notes (Signed)
Advised to use call bell to get assistance when getting up. Upon entering room Erin Lopez had got out of the chair and chair alarm went off. She was in the bathroom on the toilet. Reiterated to patient that she must use the call bell to call for assistance and not to get up on her own.

## 2023-02-01 NOTE — Progress Notes (Signed)
Report called to [facility] LPN  from [place]. Discharge instructions including paper prescription for Norco and aspirin given to EMS. No further questions at this time.

## 2023-02-01 NOTE — Progress Notes (Signed)
Occupational Therapy Treatment Patient Details Name: Erin Lopez MRN: [id number] DOB: Apr 09, 1950 Today's Date: 02/01/2023   History of present illness 72 y.o. female presenting to ED 10/26 after a fall at home. Found to have R femoral neck fx UTI, AKI on CKD2, tachycardia with Qtc prolongation. S/p 10/26 R Proximal femur ORIF WBAT PMH: hypertension, hyperlipidemia, COPD, CKD 2, history of TIA, GERD, bipolar disorder, memory difficulties   OT comments  Patient seated in recliner, reports just taking shower.  Completed mobility with min guard to bathroom, stood at sink to comb hair with min guard to close supervision.  She completed LB dressing with min guard to supervision.  Educated on fall prevention techniques and safety, continues needing cueing for hand placement during transfers and RW mgmt.  Assisted with setting alarms on her phone to assist with med management.  Will follow acutely.       If plan is discharge home, recommend the following:  A little help with walking and/or transfers;A little help with bathing/dressing/bathroom;Assistance with cooking/housework;Direct supervision/assist for medications management;Direct supervision/assist for financial management;Help with stairs or ramp for entrance;Assist for transportation   Equipment Recommendations  Other (comment) (TBD)    Recommendations for Other Services      Precautions / Restrictions Precautions Precautions: Fall Precaution Comments: hospitalization precipitated by fall Restrictions Weight Bearing Restrictions: Yes RLE Weight Bearing: Weight bearing as tolerated LLE Weight Bearing: Weight bearing as tolerated       Mobility Bed Mobility               General bed mobility comments: OOB in recliner    Transfers Overall transfer level: Needs assistance Equipment used: Rolling walker (2 wheels) Transfers: Sit to/from Stand Sit to Stand: Contact guard assist           General transfer comment:  cueing for hand placement, safety     Balance Overall balance assessment: Needs assistance Sitting-balance support: Feet supported, No upper extremity supported Sitting balance-Leahy Scale: Good     Standing balance support: Bilateral upper extremity supported, During functional activity, No upper extremity supported Standing balance-Leahy Scale: Fair Standing balance comment: RW dynamically                           ADL either performed or assessed with clinical judgement   ADL Overall ADL's : Needs assistance/impaired     Grooming: Brushing hair;Supervision/safety;Standing               Lower Body Dressing: Sit to/from stand;Contact guard assist   Toilet Transfer: Contact guard assist;Ambulation;Rolling walker (2 wheels)           Functional mobility during ADLs: Contact guard assist;Rolling walker (2 wheels)      Extremity/Trunk Assessment              Vision       Perception     Praxis      Cognition Arousal: Alert Behavior During Therapy: WFL for tasks assessed/performed Overall Cognitive Status: No family/caregiver present to determine baseline cognitive functioning                                 General Comments: patient reports having trouble recalling to take medication, assisted with setting up alarms on her phone        Exercises      Shoulder Instructions       General Comments  reviewed fall prevention techniques    Pertinent Vitals/ Pain       Pain Assessment Pain Assessment: Faces Faces Pain Scale: Hurts a little bit Pain Location: R hip Pain Descriptors / Indicators: Discomfort Pain Intervention(s): Limited activity within patient's tolerance, Monitored during session, Repositioned, Premedicated before session  Home Living                                          Prior Functioning/Environment              Frequency  Min 1X/week        Progress Toward Goals  OT  Goals(current goals can now be found in the care plan section)  Progress towards OT goals: Progressing toward goals  Acute Rehab OT Goals Patient Stated Goal: get home OT Goal Formulation: With patient Time For Goal Achievement: 02/13/23 Potential to Achieve Goals: Good  Plan      Co-evaluation                 AM-PAC OT "6 Clicks" Daily Activity     Outcome Measure   Help from another person eating meals?: None Help from another person taking care of personal grooming?: A Little Help from another person toileting, which includes using toliet, bedpan, or urinal?: A Little Help from another person bathing (including washing, rinsing, drying)?: A Little Help from another person to put on and taking off regular upper body clothing?: A Little Help from another person to put on and taking off regular lower body clothing?: A Little 6 Click Score: 19    End of Session Equipment Utilized During Treatment: Rolling walker (2 wheels)  OT Visit Diagnosis: Other abnormalities of gait and mobility (R26.89);Muscle weakness (generalized) (M62.81);Pain;History of falling (Z91.81) Pain - Right/Left: Right Pain - part of body: Hip   Activity Tolerance Patient tolerated treatment well   Patient Left in chair;with call bell/phone within reach;with chair alarm set   Nurse Communication Mobility status        Time: 6578-4696 OT Time Calculation (min): 17 min  Charges: OT General Charges $OT Visit: 1 Visit OT Treatments $Self Care/Home Management : 8-22 mins  Barry Brunner, OT Acute Rehabilitation Services Office [phone]   Chancy Milroy 02/01/2023, 12:24 PM

## 2023-02-01 NOTE — Discharge Summary (Signed)
Physician Discharge Summary  Erin Lopez ION:[id number] DOB: Aug 17, 1950 DOA: 01/28/2023  PCP: Jeoffrey Massed, MD  Admit date: 01/28/2023 Discharge date: 02/01/2023  Admitted From: Home Disposition: SNF  Recommendations for Outpatient Follow-up:  Follow up with PCP in 1-2 weeks  Discharge Condition: Stable CODE STATUS: Full Diet recommendation: Low-salt low-fat low-carb diet  Brief/Interim Summary: Erin Lopez is a 72 y.o. female with past medical history significant for HTN, HLD, COPD, CKD stage II, history of TIA, bipolar disorder, GERD, cognitive impairment who presented to [facility] ED on 10/26 from home via EMS with right hip pain following fall after she became suddenly dizzy where she struck her head on a door frame without LOC.   Patient admitted as above with severe sepsis secondary to E. coli bacteremia, likely initially seeded from E. coli UTI, POA.  Concurrent lactic acidosis noted at intake improving with fluids and supportive care.  Patient initially placed on aztreonam given multiple antibiotic allergies per prior history, discussed case with ID and will discharge patient on Bactrim per sensitivities and her allergy profile.  Patient also had notable right femoral neck fracture status post ORIF on 01/28/2023 with Dr. Aundria Rud, outpatient follow-up in 1 to 2 weeks per their schedule for staple removal and wound evaluation.  Patient's other chronic conditions appear to be stabilizing, AKI on CKD 2 has resolved, otherwise stable and agreeable for discharge to SNF for ongoing physical therapy.  Discharge Diagnoses:  Principal Problem:   Fracture of femoral neck, right, closed (HCC) Active Problems:   Memory difficulties   Hyperlipidemia   COPD (chronic obstructive pulmonary disease) (HCC)   UTI (urinary tract infection)   Bipolar 1 disorder (HCC)   Hypertension   Stage 2 chronic kidney disease   History of TIA (transient ischemic attack)   Hyponatremia   Elevated  CK   Acute renal failure superimposed on stage 2 chronic kidney disease (HCC)   Prolonged QT interval    Discharge Instructions  Discharge Instructions     Discharge patient   Complete by: As directed    Discharge disposition: 03-Skilled Nursing Facility   Discharge patient date: 02/01/2023      Allergies as of 02/01/2023       Reactions   Ceftin [cefuroxime Axetil] Anaphylaxis, Swelling   Swelling of eyes and tongue   Cipro [ciprofloxacin Hcl] Anaphylaxis, Swelling   Risperdal [risperidone] Palpitations   Generalized weakness Malaise  Hypertension   Fosamax [alendronate Sodium] Diarrhea   Linzess [linaclotide] Diarrhea   Morphine And Codeine Other (See Comments)   Hallucinations Disorientation    Prednisone Other (See Comments)   Hyperactivity   Roxicodone [oxycodone] Other (See Comments)   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   albuterol 108 (90 Base) MCG/ACT inhaler Commonly known as: VENTOLIN HFA Inhale 2 puffs into the lungs every 6 (six) hours as needed for wheezing or shortness of breath.   ALPRAZolam 0.5 MG tablet Commonly known as: XANAX TAKE 1 TABLET BY MOUTH THREE TIMES A DAY AS NEEDED FOR ANXIETY   amoxicillin 500 MG capsule Commonly known as: AMOXIL Take 2,000 mg by mouth daily as needed (1 hour prior to dental work).   aspirin EC 81 MG tablet Take 1  tablet (81 mg total) by mouth 2 (two) times daily. Swallow whole.   atorvastatin 80 MG tablet Commonly known as: LIPITOR TAKE 1 TABLET BY MOUTH DAILY. What changed:  how much to take how to take this when to take this additional instructions   buPROPion 150 MG 24 hr tablet Commonly known as: WELLBUTRIN XL Take 150 mg by mouth daily.   citalopram 20 MG  tablet Commonly known as: CELEXA Take 1 tablet (20 mg total) by mouth in the morning and at bedtime.   famotidine 40 MG tablet Commonly known as: PEPCID Take 1 tablet (40 mg total) by mouth daily.   fluticasone 50 MCG/ACT nasal spray Commonly known as: FLONASE SPRAY 2 SPRAYS INTO EACH NOSTRIL EVERY DAY What changed: See the new instructions.   hydrochlorothiazide 12.5 MG tablet Commonly known as: HYDRODIURIL TAKE 1 TABLET BY MOUTH EVERY DAY   HYDROcodone-acetaminophen 7.5-325 MG tablet Commonly known as: Norco Take 1 tablet by mouth every 4 (four) hours as needed for moderate pain (pain score 4-6).   Klor-Con M20 20 MEQ tablet Generic drug: potassium chloride SA TAKE 1 TABLET BY MOUTH EVERY DAY   lamoTRIgine 100 MG tablet Commonly known as: LAMICTAL TAKE 3 TABLETS (300 MG TOTAL) BY MOUTH AT BEDTIME   loratadine 10 MG tablet Commonly known as: CLARITIN Take 10 mg by mouth daily.   losartan 25 MG tablet Commonly known as: COZAAR TAKE 1 TABLET BY MOUTH EVERY DAY   meloxicam 15 MG tablet Commonly known as: MOBIC TAKE 1 TABLET BY MOUTH ONCE DAILY AS NEEDED FOR JOINT PAIN   metoprolol succinate 100 MG 24 hr tablet Commonly known as: TOPROL-XL TAKE 1 TABLET BY MOUTH EVERY DAY WITH OR IMMEDIATELY FOLLOWING A MEAL   montelukast 10 MG tablet Commonly known as: SINGULAIR TAKE 1 TABLET BY MOUTH EVERY DAY IN THE EVENING   ondansetron 4 MG tablet Commonly known as: Zofran Take 1 tablet (4 mg total) by mouth every 8 (eight) hours as needed for nausea or vomiting.   pantoprazole 40 MG tablet Commonly known as: PROTONIX TAKE 1 TABLET (40 MG TOTAL) BY MOUTH DAILY. KEEP F/U APPT FOR ANY FURTHER REFILLS   Stiolto Respimat 2.5-2.5 MCG/ACT Aers Generic drug: Tiotropium Bromide-Olodaterol INHALE 2 EACH INTO THE LUNGS DAILY.   sulfamethoxazole-trimethoprim 800-160 MG tablet Commonly known as: BACTRIM DS Take 1 tablet by mouth every 12 (twelve) hours for 3 days.   zolpidem 10  MG tablet Commonly known as: AMBIEN TAKE 1 TABLET BY MOUTH EVERYDAY AT BEDTIME        Contact information for follow-up providers     Yolonda Kida, MD Follow up in 2 week(s).   Specialty: Orthopedic Surgery Why: For wound re-check, For suture removal Contact information: [address] [place] [address] [phone]              Contact information for after-discharge care     Destination     [facility] .   Service: Skilled Nursing Contact information: [address] 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 2 hip screws are placed fixating femoral neck fracture. Alignment is anatomic. Fluoroscopy time 65.5 seconds. Fluoroscopy dose 8.82 micro gray. IMPRESSION: Intraoperative fluoroscopic views of the right hip. Electronically Signed   By: Darliss Cheney M.D.   On: 01/28/2023 22:45   CT Hip Right Wo Contrast  Result Date: 01/28/2023 CLINICAL DATA:  Hip trauma  EXAM: CT OF THE RIGHT HIP WITHOUT CONTRAST TECHNIQUE: Multidetector CT imaging of the right hip was performed according to the standard protocol. Multiplanar CT image reconstructions were also generated. RADIATION DOSE REDUCTION: This exam was performed according to the departmental dose-optimization program which includes automated exposure control, adjustment of the mA and/or kV according to patient size and/or use of iterative reconstruction technique. COMPARISON:  X-ray same day FINDINGS: Bones/Joint/Cartilage There is an acute subcapital mildly impacted femoral neck fracture on the right. Joint spaces are well maintained. Joint effusion present. Ligaments Suboptimally assessed by CT. Muscles and Tendons Negative. Soft tissues No subcutaneous hematoma. Atherosclerotic calcifications of the aorta present. IMPRESSION: 1. Acute subcapital mildly impacted femoral neck fracture on the right. Aortic Atherosclerosis (ICD10-I70.0). Electronically Signed   By: Darliss Cheney M.D.   On: 01/28/2023 18:11   CT Head Wo Contrast  Result Date: 01/28/2023 CLINICAL DATA:  Head trauma, minor. EXAM: CT HEAD WITHOUT CONTRAST CT CERVICAL SPINE WITHOUT CONTRAST TECHNIQUE: Multidetector CT imaging of the head and cervical spine was performed following the standard protocol without intravenous contrast. Multiplanar CT image reconstructions of the cervical spine were also generated. RADIATION DOSE REDUCTION: This exam was performed according to the departmental dose-optimization program which includes automated exposure control, adjustment of the mA and/or kV according to patient size and/or use of iterative reconstruction technique. COMPARISON:  09/30/2021 FINDINGS: CT HEAD FINDINGS Brain: No evidence of acute infarction, hemorrhage, hydrocephalus, extra-axial collection or mass lesion/mass effect. Prominence of the sulci and ventricles. There is mild diffuse low-attenuation within the subcortical and periventricular white matter  compatible with chronic microvascular disease. Vascular: No hyperdense vessel or unexpected calcification. Skull: Normal. Negative for fracture or focal lesion. Sinuses/Orbits: None Other: . CT CERVICAL SPINE FINDINGS Alignment: No acute, posttraumatic malalignment.Unchanged chronic anterolisthesis of C2 on C3 which measures 4.8 cm on today's study, image 37/9. Previously this measured the same. Anterolisthesis of C7 on T1 measures 3.8 cm, also unchanged from previous exam Skull base and vertebrae: No acute fracture. No primary bone lesion or focal pathologic process. Soft tissues and spinal canal: No prevertebral fluid or swelling. No visible canal hematoma. Disc levels: Prior C3 through C7 ACDF status post C6 and C7 hardware removal. 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 Prior left laminectomy at C7. Severe left uncovertebral hypertrophy at C2-3. Upper chest: Negative. Other: None IMPRESSION: 1. No acute intracranial abnormalities. 2. Chronic microvascular disease and cerebral atrophy. 3. No evidence for cervical spine fracture or subluxation. 4. Prior C3 through C7 ACDF status post C6 and C7 hardware removal. 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 5.  Unchanged chronic anterolisthesis of C2 on C3 and C7 on T1. Electronically Signed   By: Signa Kell M.D.   On: 01/28/2023 13:12   CT Cervical Spine Wo Contrast  Result Date: 01/28/2023 CLINICAL DATA:  Head trauma, minor. EXAM: CT HEAD WITHOUT CONTRAST CT CERVICAL SPINE WITHOUT CONTRAST TECHNIQUE: Multidetector CT imaging of the head and cervical spine was performed following the standard protocol without intravenous contrast. Multiplanar CT image reconstructions of the cervical spine were also generated. RADIATION DOSE REDUCTION: This exam was performed according to the departmental dose-optimization program which includes automated exposure control, adjustment of the mA and/or kV according to patient size and/or use of iterative reconstruction  technique. COMPARISON:  09/30/2021 FINDINGS: CT HEAD FINDINGS Brain: No evidence of acute infarction, hemorrhage, hydrocephalus, extra-axial collection or mass lesion/mass effect. Prominence of the sulci and ventricles. There is mild diffuse low-attenuation within the subcortical and periventricular white matter compatible with chronic microvascular disease. Vascular: No hyperdense vessel or unexpected calcification. Skull: Normal. Negative for fracture or focal lesion. Sinuses/Orbits: None Other: . CT CERVICAL SPINE FINDINGS Alignment: No acute, posttraumatic malalignment.Unchanged chronic anterolisthesis of C2 on C3 which measures 4.8 cm on today's study, image 37/9. Previously this measured the same. Anterolisthesis of C7 on T1 measures 3.8 cm, also unchanged from previous exam Skull base and vertebrae: No acute fracture. No primary bone lesion or focal pathologic process. Soft tissues and spinal canal: No prevertebral fluid or swelling. No visible canal hematoma. Disc levels: Prior C3 through C7 ACDF status post C6 and C7 hardware removal. 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 Prior left laminectomy at C7. Severe left uncovertebral hypertrophy at C2-3. Upper chest: Negative. Other: None IMPRESSION: 1. No acute intracranial abnormalities. 2. Chronic microvascular disease and cerebral atrophy. 3. No evidence for cervical spine fracture or subluxation. 4. Prior C3 through C7 ACDF status post C6 and C7 hardware removal. 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 5. Unchanged chronic anterolisthesis of C2 on C3 and C7 on T1. Electronically Signed   By: Signa Kell M.D.   On: 01/28/2023 13:12   DG Chest Port 1 View  Result Date: 01/28/2023 CLINICAL DATA:  MC-DGQuestionable sepsis - evaluate for abnormality fall. Found on floor. EXAM: PORTABLE CHEST 1 VIEW COMPARISON:  01/12/2023 FINDINGS: Normal mediastinum and cardiac silhouette. Normal pulmonary vasculature. No evidence of effusion, infiltrate, or  pneumothorax. No acute bony abnormality. IMPRESSION: No acute cardiopulmonary process. Electronically Signed   By: Genevive Bi M.D.   On: 01/28/2023 12:20   DG Hip Unilat W or Wo Pelvis 2-3 Views Right  Result Date: 01/28/2023 CLINICAL DATA:  72 year old female with history of trauma from a fall. Right hip pain. EXAM: DG HIP (WITH OR WITHOUT PELVIS) 2-3V RIGHT COMPARISON:  No priors. FINDINGS: Three views of the bony pelvis in the right hip demonstrate no acute displaced fracture of the bony pelvic ring. There is a minimally displaced mildly impacted transcervical right femoral neck fracture. Femoral head remains located. Visualized portions of the left proximal femur appear intact. There is joint space narrowing, subchondral sclerosis, subchondral cyst formation and osteophyte formation in the hip joints bilaterally, indicative of osteoarthritis. IMPRESSION: 1. Minimally displaced likely mildly impacted transcervical right femoral neck fracture, as above. Electronically Signed   By: Trudie Reed M.D.   On: 01/28/2023 12:20     Subjective: No acute issues or events overnight denies nausea vomiting diarrhea constipation any fevers chills or chest pain.  Patient's main complaint is request to have a shower.   Discharge  Exam: Vitals:   02/01/23 0830 02/01/23 0932  BP: (!) 158/82 (!) 152/84  Pulse: 84 86  Resp: 17   Temp: 98.1 F (36.7 C)   SpO2: 99%    Vitals:   01/31/23 2125 02/01/23 0531 02/01/23 0830 02/01/23 0932  BP: (!) 143/74 (!) 155/77 (!) 158/82 (!) 152/84  Pulse: 85 86 84 86  Resp: 17 16 17    Temp: 97.7 F (36.5 C) 97.8 F (36.6 C) 98.1 F (36.7 C)   TempSrc: Oral Oral Oral   SpO2: 94% 93% 99%   Weight:      Height:       General:  Pleasantly resting in bed, No acute distress. HEENT:  Normocephalic atraumatic.  Sclerae nonicteric, noninjected.  Extraocular movements intact bilaterally. Neck:  Without mass or deformity.  Trachea is midline. Lungs:  Clear to  auscultate bilaterally without rhonchi, wheeze, or rales. Heart:  Regular rate and rhythm.  Without murmurs, rubs, or gallops. Abdomen:  Soft, nontender, nondistended.  Without guarding or rebound. Extremities: Without cyanosis, clubbing, edema, or obvious deformity.  Right lower extremity bandage clean dry intact Skin:  Warm and dry, no erythema.   The results of significant diagnostics from this hospitalization (including imaging, microbiology, ancillary and laboratory) are listed below for reference.     Microbiology: Recent Results (from the past 240 hour(s))  Resp panel by RT-PCR (RSV, Flu A&B, Covid) Anterior Nasal Swab     Status: None   Collection Time: 01/28/23 11:14 AM   Specimen: Anterior Nasal Swab  Result Value Ref Range Status   SARS Coronavirus 2 by RT PCR NEGATIVE NEGATIVE Final   Influenza A by PCR NEGATIVE NEGATIVE Final   Influenza B by PCR NEGATIVE NEGATIVE Final    Comment: (NOTE) The Xpert Xpress SARS-CoV-2/FLU/RSV plus assay is intended as an aid in the diagnosis of influenza from Nasopharyngeal swab specimens and should not be used as a sole basis for treatment. Nasal washings and aspirates are unacceptable for Xpert Xpress SARS-CoV-2/FLU/RSV testing.  Fact Sheet for Patients: BloggerCourse.com  Fact Sheet for Healthcare Providers: SeriousBroker.it  This test is not yet approved or cleared by the [place] FDA and has been authorized for detection and/or diagnosis of SARS-CoV-2 by FDA under an Emergency Use Authorization (EUA). This EUA will remain in effect (meaning this test can be used) for the duration of the COVID-19 declaration under Section 564(b)(1) of the Act, 21 U.S.C. section 360bbb-3(b)(1), unless the authorization is terminated or revoked.     Resp Syncytial Virus by PCR NEGATIVE NEGATIVE Final    Comment: (NOTE) Fact Sheet for  Patients: BloggerCourse.com  Fact Sheet for Healthcare Providers: SeriousBroker.it  This test is not yet approved or cleared by the [place] FDA and has been authorized for detection and/or diagnosis of SARS-CoV-2 by FDA under an Emergency Use Authorization (EUA). This EUA will remain in effect (meaning this test can be used) for the duration of the COVID-19 declaration under Section 564(b)(1) of the Act, 21 U.S.C. section 360bbb-3(b)(1), unless the authorization is terminated or revoked.  Performed at [facility], [address] [place], [place] [address]   Blood Culture (routine x 2)     Status: Abnormal   Collection Time: 01/28/23 11:18 AM   Specimen: BLOOD  Result Value Ref Range Status   Specimen Description BLOOD RIGHT ANTECUBITAL  Final   Special Requests   Final    BOTTLES DRAWN AEROBIC AND ANAEROBIC Blood Culture adequate volume   Culture  Setup  Time   Final    GRAM NEGATIVE RODS IN BOTH AEROBIC AND ANAEROBIC BOTTLES CRITICAL VALUE NOTED.  VALUE IS CONSISTENT WITH PREVIOUSLY REPORTED AND CALLED VALUE.    Culture (A)  Final    ESCHERICHIA COLI SUSCEPTIBILITIES PERFORMED ON PREVIOUS CULTURE WITHIN THE LAST 5 DAYS. Performed at [facility], [address] [place], [place] [address]    Report Status 01/31/2023 FINAL  Final  Urine Culture     Status: Abnormal   Collection Time: 01/28/23 11:23 AM   Specimen: Urine, Random  Result Value Ref Range Status   Specimen Description URINE, RANDOM  Final   Special Requests   Final    NONE Reflexed from [id number] Performed at [facility], [address] [place], [place] [address]    Culture >=100,000 COLONIES/mL ESCHERICHIA COLI (A)  Final   Report Status 01/30/2023 FINAL  Final   Organism ID, Bacteria ESCHERICHIA COLI (A)  Final      Susceptibility   Escherichia coli - MIC*    AMPICILLIN >=32 RESISTANT Resistant     CEFAZOLIN <=4 SENSITIVE Sensitive      CEFEPIME <=0.12 SENSITIVE Sensitive     CEFTRIAXONE <=0.25 SENSITIVE Sensitive     CIPROFLOXACIN <=0.25 SENSITIVE Sensitive     GENTAMICIN <=1 SENSITIVE Sensitive     IMIPENEM <=0.25 SENSITIVE Sensitive     NITROFURANTOIN <=16 SENSITIVE Sensitive     TRIMETH/SULFA <=20 SENSITIVE Sensitive     AMPICILLIN/SULBACTAM 16 INTERMEDIATE Intermediate     PIP/TAZO <=4 SENSITIVE Sensitive ug/mL    * >=100,000 COLONIES/mL ESCHERICHIA COLI  Blood Culture (routine x 2)     Status: Abnormal   Collection Time: 01/28/23 11:41 AM   Specimen: BLOOD RIGHT ARM  Result Value Ref Range Status   Specimen Description BLOOD RIGHT ARM  Final   Special Requests   Final    BOTTLES DRAWN AEROBIC AND ANAEROBIC Blood Culture adequate volume   Culture  Setup Time   Final    GRAM NEGATIVE RODS IN BOTH AEROBIC AND ANAEROBIC BOTTLES CRITICAL RESULT CALLED TO, READ BACK BY AND VERIFIED WITH: V BRYK,PHARMD@0439  01/29/23 MK    Culture ESCHERICHIA COLI (A)  Final   Report Status 01/31/2023 FINAL  Final   Organism ID, Bacteria ESCHERICHIA COLI  Final   Organism ID, Bacteria ESCHERICHIA COLI  Final      Susceptibility   Escherichia coli - MIC*    AMPICILLIN >=32 RESISTANT Resistant     CEFEPIME <=0.12 SENSITIVE Sensitive     CEFTAZIDIME <=1 SENSITIVE Sensitive     CEFTRIAXONE <=0.25 SENSITIVE Sensitive     CIPROFLOXACIN <=0.25 SENSITIVE Sensitive     GENTAMICIN <=1 SENSITIVE Sensitive     IMIPENEM <=0.25 SENSITIVE Sensitive     TRIMETH/SULFA <=20 SENSITIVE Sensitive     AMPICILLIN/SULBACTAM 16 INTERMEDIATE Intermediate     PIP/TAZO <=4 SENSITIVE Sensitive ug/mL   Escherichia coli - KIRBY BAUER*    CEFAZOLIN Value in next row Sensitive      SENSITIVEPerformed at [facility], [address] [place], [place] [address]    * ESCHERICHIA COLI    ESCHERICHIA COLI  Blood Culture ID Panel (Reflexed)     Status: Abnormal   Collection Time: 01/28/23 11:41 AM  Result Value Ref Range Status   Enterococcus  faecalis NOT DETECTED NOT DETECTED Final   Enterococcus Faecium NOT DETECTED NOT DETECTED Final   Listeria monocytogenes NOT DETECTED NOT DETECTED Final   Staphylococcus species NOT DETECTED NOT DETECTED  Final   Staphylococcus aureus (BCID) NOT DETECTED NOT DETECTED Final   Staphylococcus epidermidis NOT DETECTED NOT DETECTED Final   Staphylococcus lugdunensis NOT DETECTED NOT DETECTED Final   Streptococcus species NOT DETECTED NOT DETECTED Final   Streptococcus agalactiae NOT DETECTED NOT DETECTED Final   Streptococcus pneumoniae NOT DETECTED NOT DETECTED Final   Streptococcus pyogenes NOT DETECTED NOT DETECTED Final   A.calcoaceticus-baumannii NOT DETECTED NOT DETECTED Final   Bacteroides fragilis NOT DETECTED NOT DETECTED Final   Enterobacterales DETECTED (A) NOT DETECTED Final    Comment: Enterobacterales represent a large order of gram negative bacteria, not a single organism. CRITICAL RESULT CALLED TO, READ BACK BY AND VERIFIED WITH: V BRYK,PHARMD@0435  01/29/23 MK    Enterobacter cloacae complex NOT DETECTED NOT DETECTED Final   Escherichia coli DETECTED (A) NOT DETECTED Final    Comment: CRITICAL RESULT CALLED TO, READ BACK BY AND VERIFIED WITH: V BRYK,PHARMD@0435  01/29/23 MK    Klebsiella aerogenes NOT DETECTED NOT DETECTED Final   Klebsiella oxytoca NOT DETECTED NOT DETECTED Final   Klebsiella pneumoniae NOT DETECTED NOT DETECTED Final   Proteus species NOT DETECTED NOT DETECTED Final   Salmonella species NOT DETECTED NOT DETECTED Final   Serratia marcescens NOT DETECTED NOT DETECTED Final   Haemophilus influenzae NOT DETECTED NOT DETECTED Final   Neisseria meningitidis NOT DETECTED NOT DETECTED Final   Pseudomonas aeruginosa NOT DETECTED NOT DETECTED Final   Stenotrophomonas maltophilia NOT DETECTED NOT DETECTED Final   Candida albicans NOT DETECTED NOT DETECTED Final   Candida auris NOT DETECTED NOT DETECTED Final   Candida glabrata NOT DETECTED NOT DETECTED Final    Candida krusei NOT DETECTED NOT DETECTED Final   Candida parapsilosis NOT DETECTED NOT DETECTED Final   Candida tropicalis NOT DETECTED NOT DETECTED Final   Cryptococcus neoformans/gattii NOT DETECTED NOT DETECTED Final   CTX-M ESBL NOT DETECTED NOT DETECTED Final   Carbapenem resistance IMP NOT DETECTED NOT DETECTED Final   Carbapenem resistance KPC NOT DETECTED NOT DETECTED Final   Carbapenem resistance NDM NOT DETECTED NOT DETECTED Final   Carbapenem resist OXA 48 LIKE NOT DETECTED NOT DETECTED Final   Carbapenem resistance VIM NOT DETECTED NOT DETECTED Final    Comment: Performed at [facility], [address] [place], [place] [address]     Labs: BNP (last 3 results) No results for input(s): "BNP" in the last 8760 hours. Basic Metabolic Panel: Recent Labs  Lab 01/28/23 1050 01/28/23 1945 01/29/23 0931 01/30/23 0907 01/31/23 0728 02/01/23 0901  NA 127* 129* 131* 130* 134* 137  K 3.3* 3.7 4.1 4.1 4.4 4.2  CL 94*  --  101 101 104 104  CO2 21*  --  22 22 22 26   GLUCOSE 140*  --  159* 144* 92 94  BUN 29*  --  28* 44* 34* 16  CREATININE 1.58*  --  1.43* 1.95* 1.31* 0.87  CALCIUM 8.4*  --  7.9* 8.3* 8.2* 8.8*  MG  --   --   --  2.5*  --  1.7   Liver Function Tests: Recent Labs  Lab 01/28/23 1050 01/30/23 0907  AST 44* 64*  ALT 31 38  ALKPHOS 82 83  BILITOT 1.4* 0.5  PROT 6.0* 5.6*  ALBUMIN 3.4* 2.8*   No results for input(s): "LIPASE", "AMYLASE" in the last 168 hours. No results for input(s): "AMMONIA" in the last 168 hours. CBC: Recent Labs  Lab 01/28/23 1050 01/28/23 1945 01/29/23 6063 01/30/23 0160 01/31/23 1093 02/01/23 0901  WBC 12.5*  --  9.8 15.3* 10.7* 10.2  NEUTROABS 10.9*  --   --   --   --   --   HGB 9.9* 9.2* 8.6* 8.8* 8.1* 8.8*  HCT 29.8* 27.0* 25.1* 26.3* 24.5* 27.1*  MCV 89.0  --  88.4 91.0 90.7 89.7  PLT 200  --  175 222 240 283   Cardiac Enzymes: Recent Labs  Lab 01/28/23 1050 01/29/23 0931 01/30/23 0907 01/31/23 1219   CKTOTAL 1,118* 1,712* 598* 136   BNP: Invalid input(s): "POCBNP" CBG: No results for input(s): "GLUCAP" in the last 168 hours. D-Dimer No results for input(s): "DDIMER" in the last 72 hours. Hgb A1c No results for input(s): "HGBA1C" in the last 72 hours. Lipid Profile No results for input(s): "CHOL", "HDL", "LDLCALC", "TRIG", "CHOLHDL", "LDLDIRECT" in the last 72 hours. Thyroid function studies No results for input(s): "TSH", "T4TOTAL", "T3FREE", "THYROIDAB" in the last 72 hours.  Invalid input(s): "FREET3" Anemia work up No results for input(s): "VITAMINB12", "FOLATE", "FERRITIN", "TIBC", "IRON", "RETICCTPCT" in the last 72 hours. Urinalysis    Component Value Date/Time   COLORURINE YELLOW 01/28/2023 1123   APPEARANCEUR CLEAR 01/28/2023 1123   LABSPEC 1.020 01/28/2023 1123   PHURINE 5.5 01/28/2023 1123   GLUCOSEU NEGATIVE 01/28/2023 1123   GLUCOSEU NEGATIVE 05/29/2013 1156   HGBUR MODERATE (A) 01/28/2023 1123   BILIRUBINUR NEGATIVE 01/28/2023 1123   BILIRUBINUR negative 06/26/2019 1307   KETONESUR NEGATIVE 01/28/2023 1123   PROTEINUR 30 (A) 01/28/2023 1123   UROBILINOGEN 0.2 06/26/2019 1307   UROBILINOGEN 0.2 08/10/2013 1611   NITRITE POSITIVE (A) 01/28/2023 1123   LEUKOCYTESUR MODERATE (A) 01/28/2023 1123   Sepsis Labs Recent Labs  Lab 01/29/23 0931 01/30/23 0907 01/31/23 0728 02/01/23 0901  WBC 9.8 15.3* 10.7* 10.2   Microbiology Recent Results (from the past 240 hour(s))  Resp panel by RT-PCR (RSV, Flu A&B, Covid) Anterior Nasal Swab     Status: None   Collection Time: 01/28/23 11:14 AM   Specimen: Anterior Nasal Swab  Result Value Ref Range Status   SARS Coronavirus 2 by RT PCR NEGATIVE NEGATIVE Final   Influenza A by PCR NEGATIVE NEGATIVE Final   Influenza B by PCR NEGATIVE NEGATIVE Final    Comment: (NOTE) The Xpert Xpress SARS-CoV-2/FLU/RSV plus assay is intended as an aid in the diagnosis of influenza from Nasopharyngeal swab specimens and should  not be used as a sole basis for treatment. Nasal washings and aspirates are unacceptable for Xpert Xpress SARS-CoV-2/FLU/RSV testing.  Fact Sheet for Patients: BloggerCourse.com  Fact Sheet for Healthcare Providers: SeriousBroker.it  This test is not yet approved or cleared by the [place] FDA and has been authorized for detection and/or diagnosis of SARS-CoV-2 by FDA under an Emergency Use Authorization (EUA). This EUA will remain in effect (meaning this test can be used) for the duration of the COVID-19 declaration under Section 564(b)(1) of the Act, 21 U.S.C. section 360bbb-3(b)(1), unless the authorization is terminated or revoked.     Resp Syncytial Virus by PCR NEGATIVE NEGATIVE Final    Comment: (NOTE) Fact Sheet for Patients: BloggerCourse.com  Fact Sheet for Healthcare Providers: SeriousBroker.it  This test is not yet approved or cleared by the [place] FDA and has been authorized for detection and/or diagnosis of SARS-CoV-2 by FDA under an Emergency Use Authorization (EUA). This EUA will remain in effect (meaning this test can be used) for the duration of the COVID-19 declaration under Section 564(b)(1) of the Act, 21 U.S.C. section 360bbb-3(b)(1), unless  the authorization is terminated or revoked.  Performed at [facility], [address], [place], [place] [address]   Blood Culture (routine x 2)     Status: Abnormal   Collection Time: 01/28/23 11:18 AM   Specimen: BLOOD  Result Value Ref Range Status   Specimen Description BLOOD RIGHT ANTECUBITAL  Final   Special Requests   Final    BOTTLES DRAWN AEROBIC AND ANAEROBIC Blood Culture adequate volume   Culture  Setup Time   Final    GRAM NEGATIVE RODS IN BOTH AEROBIC AND ANAEROBIC BOTTLES CRITICAL VALUE NOTED.  VALUE IS CONSISTENT WITH PREVIOUSLY REPORTED AND CALLED VALUE.    Culture (A)  Final     ESCHERICHIA COLI SUSCEPTIBILITIES PERFORMED ON PREVIOUS CULTURE WITHIN THE LAST 5 DAYS. Performed at [facility], [address], [place], [place] [address]    Report Status 01/31/2023 FINAL  Final  Urine Culture     Status: Abnormal   Collection Time: 01/28/23 11:23 AM   Specimen: Urine, Random  Result Value Ref Range Status   Specimen Description URINE, RANDOM  Final   Special Requests   Final    NONE Reflexed from [id number] Performed at [facility], [address], [place], [place] [address]    Culture >=100,000 COLONIES/mL ESCHERICHIA COLI (A)  Final   Report Status 01/30/2023 FINAL  Final   Organism ID, Bacteria ESCHERICHIA COLI (A)  Final      Susceptibility   Escherichia coli - MIC*    AMPICILLIN >=32 RESISTANT Resistant     CEFAZOLIN <=4 SENSITIVE Sensitive     CEFEPIME <=0.12 SENSITIVE Sensitive     CEFTRIAXONE <=0.25 SENSITIVE Sensitive     CIPROFLOXACIN <=0.25 SENSITIVE Sensitive     GENTAMICIN <=1 SENSITIVE Sensitive     IMIPENEM <=0.25 SENSITIVE Sensitive     NITROFURANTOIN <=16 SENSITIVE Sensitive     TRIMETH/SULFA <=20 SENSITIVE Sensitive     AMPICILLIN/SULBACTAM 16 INTERMEDIATE Intermediate     PIP/TAZO <=4 SENSITIVE Sensitive ug/mL    * >=100,000 COLONIES/mL ESCHERICHIA COLI  Blood Culture (routine x 2)     Status: Abnormal   Collection Time: 01/28/23 11:41 AM   Specimen: BLOOD RIGHT ARM  Result Value Ref Range Status   Specimen Description BLOOD RIGHT ARM  Final   Special Requests   Final    BOTTLES DRAWN AEROBIC AND ANAEROBIC Blood Culture adequate volume   Culture  Setup Time   Final    GRAM NEGATIVE RODS IN BOTH AEROBIC AND ANAEROBIC BOTTLES CRITICAL RESULT CALLED TO, READ BACK BY AND VERIFIED WITH: V BRYK,PHARMD@0439  01/29/23 MK    Culture ESCHERICHIA COLI (A)  Final   Report Status 01/31/2023 FINAL  Final   Organism ID, Bacteria ESCHERICHIA COLI  Final   Organism ID, Bacteria ESCHERICHIA COLI  Final      Susceptibility    Escherichia coli - MIC*    AMPICILLIN >=32 RESISTANT Resistant     CEFEPIME <=0.12 SENSITIVE Sensitive     CEFTAZIDIME <=1 SENSITIVE Sensitive     CEFTRIAXONE <=0.25 SENSITIVE Sensitive     CIPROFLOXACIN <=0.25 SENSITIVE Sensitive     GENTAMICIN <=1 SENSITIVE Sensitive     IMIPENEM <=0.25 SENSITIVE Sensitive     TRIMETH/SULFA <=20 SENSITIVE Sensitive     AMPICILLIN/SULBACTAM 16 INTERMEDIATE Intermediate     PIP/TAZO <=4 SENSITIVE Sensitive ug/mL   Escherichia coli - KIRBY BAUER*    CEFAZOLIN Value in next row Sensitive      SENSITIVEPerformed  at [facility], [address], [place], [place] [address]    * ESCHERICHIA COLI    ESCHERICHIA COLI  Blood Culture ID Panel (Reflexed)     Status: Abnormal   Collection Time: 01/28/23 11:41 AM  Result Value Ref Range Status   Enterococcus faecalis NOT DETECTED NOT DETECTED Final   Enterococcus Faecium NOT DETECTED NOT DETECTED Final   Listeria monocytogenes NOT DETECTED NOT DETECTED Final   Staphylococcus species NOT DETECTED NOT DETECTED Final   Staphylococcus aureus (BCID) NOT DETECTED NOT DETECTED Final   Staphylococcus epidermidis NOT DETECTED NOT DETECTED Final   Staphylococcus lugdunensis NOT DETECTED NOT DETECTED Final   Streptococcus species NOT DETECTED NOT DETECTED Final   Streptococcus agalactiae NOT DETECTED NOT DETECTED Final   Streptococcus pneumoniae NOT DETECTED NOT DETECTED Final   Streptococcus pyogenes NOT DETECTED NOT DETECTED Final   A.calcoaceticus-baumannii NOT DETECTED NOT DETECTED Final   Bacteroides fragilis NOT DETECTED NOT DETECTED Final   Enterobacterales DETECTED (A) NOT DETECTED Final    Comment: Enterobacterales represent a large order of gram negative bacteria, not a single organism. CRITICAL RESULT CALLED TO, READ BACK BY AND VERIFIED WITH: V BRYK,PHARMD@0435  01/29/23 MK    Enterobacter cloacae complex NOT DETECTED NOT DETECTED Final   Escherichia coli DETECTED (A) NOT DETECTED Final    Comment:  CRITICAL RESULT CALLED TO, READ BACK BY AND VERIFIED WITH: V BRYK,PHARMD@0435  01/29/23 MK    Klebsiella aerogenes NOT DETECTED NOT DETECTED Final   Klebsiella oxytoca NOT DETECTED NOT DETECTED Final   Klebsiella pneumoniae NOT DETECTED NOT DETECTED Final   Proteus species NOT DETECTED NOT DETECTED Final   Salmonella species NOT DETECTED NOT DETECTED Final   Serratia marcescens NOT DETECTED NOT DETECTED Final   Haemophilus influenzae NOT DETECTED NOT DETECTED Final   Neisseria meningitidis NOT DETECTED NOT DETECTED Final   Pseudomonas aeruginosa NOT DETECTED NOT DETECTED Final   Stenotrophomonas maltophilia NOT DETECTED NOT DETECTED Final   Candida albicans NOT DETECTED NOT DETECTED Final   Candida auris NOT DETECTED NOT DETECTED Final   Candida glabrata NOT DETECTED NOT DETECTED Final   Candida krusei NOT DETECTED NOT DETECTED Final   Candida parapsilosis NOT DETECTED NOT DETECTED Final   Candida tropicalis NOT DETECTED NOT DETECTED Final   Cryptococcus neoformans/gattii NOT DETECTED NOT DETECTED Final   CTX-M ESBL NOT DETECTED NOT DETECTED Final   Carbapenem resistance IMP NOT DETECTED NOT DETECTED Final   Carbapenem resistance KPC NOT DETECTED NOT DETECTED Final   Carbapenem resistance NDM NOT DETECTED NOT DETECTED Final   Carbapenem resist OXA 48 LIKE NOT DETECTED NOT DETECTED Final   Carbapenem resistance VIM NOT DETECTED NOT DETECTED Final    Comment: Performed at [facility], [address], [place], [place] [address]     Time coordinating discharge: Over 30 minutes  SIGNED:   Azucena Fallen, DO [facility] 02/01/2023, 11:50 AM Pager   If 7PM-7AM, please contact night-coverage [email or web address]

## 2023-02-01 NOTE — TOC Transition Note (Signed)
Transition of Care [facility]) - CM/SW Discharge Note   Patient Details  Name: Erin Lopez MRN: [id number] Date of Birth: 12-22-1950  [facility]) CM/SW Contact:  Carley Hammed, LCSW Phone Number: 02/01/2023, 11:01 AM   Clinical Narrative:    Pt to be transported to [place] via [facility]. Nurse to call report to [phone].   Final next level of care: Skilled Nursing Facility Barriers to Discharge: Barriers Resolved   Patient Goals and CMS Choice CMS Medicare.gov Compare Post Acute Care list provided to:: Patient Choice offered to / list presented to : Patient  Discharge Placement                Patient chooses bed at: [facility] Patient to be transferred to facility by: [facility] Name of family member notified: Patient Patient and family notified of of transfer: 02/01/23  Discharge Plan and Services Additional resources added to the After Visit Summary for       Post Acute Care Choice: Skilled Nursing Facility                               Social Determinants of Health (SDOH) Interventions SDOH Screenings   Food Insecurity: No Food Insecurity (01/28/2023)  Housing: Low Risk  (01/28/2023)  Transportation Needs: No Transportation Needs (01/28/2023)  Utilities: Not At Risk (01/28/2023)  Alcohol Screen: Low Risk  (03/11/2020)  Depression (PHQ2-9): Medium Risk (01/19/2023)  Financial Resource Strain: Low Risk  (06/24/2021)  Physical Activity: Inactive (06/24/2021)  Social Connections: Socially Isolated (06/24/2021)  Stress: No Stress Concern Present (06/24/2021)  Tobacco Use: Medium Risk (01/28/2023)     Readmission Risk Interventions    01/20/2022   11:59 AM  Readmission Risk Prevention Plan  Transportation Screening Complete  PCP or Specialist Appt within 5-7 Days Complete  Home Care Screening Complete  Medication Review (RN CM) Complete

## 2023-02-03 DIAGNOSIS — I150 Renovascular hypertension: Secondary | ICD-10-CM | POA: Diagnosis not present

## 2023-02-03 DIAGNOSIS — F319 Bipolar disorder, unspecified: Secondary | ICD-10-CM | POA: Diagnosis not present

## 2023-02-03 DIAGNOSIS — N390 Urinary tract infection, site not specified: Secondary | ICD-10-CM | POA: Diagnosis not present

## 2023-02-03 DIAGNOSIS — Z87448 Personal history of other diseases of urinary system: Secondary | ICD-10-CM | POA: Diagnosis not present

## 2023-02-03 DIAGNOSIS — J449 Chronic obstructive pulmonary disease, unspecified: Secondary | ICD-10-CM | POA: Diagnosis not present

## 2023-02-03 DIAGNOSIS — Z9181 History of falling: Secondary | ICD-10-CM | POA: Diagnosis not present

## 2023-02-03 DIAGNOSIS — F1320 Sedative, hypnotic or anxiolytic dependence, uncomplicated: Secondary | ICD-10-CM | POA: Diagnosis not present

## 2023-02-03 DIAGNOSIS — S7291XD Unspecified fracture of right femur, subsequent encounter for closed fracture with routine healing: Secondary | ICD-10-CM | POA: Diagnosis not present

## 2023-02-03 DIAGNOSIS — A419 Sepsis, unspecified organism: Secondary | ICD-10-CM | POA: Diagnosis not present

## 2023-02-03 DIAGNOSIS — N182 Chronic kidney disease, stage 2 (mild): Secondary | ICD-10-CM | POA: Diagnosis not present

## 2023-02-03 DIAGNOSIS — B9620 Unspecified Escherichia coli [E. coli] as the cause of diseases classified elsewhere: Secondary | ICD-10-CM | POA: Diagnosis not present

## 2023-02-03 DIAGNOSIS — G3184 Mild cognitive impairment, so stated: Secondary | ICD-10-CM | POA: Diagnosis not present

## 2023-02-06 ENCOUNTER — Encounter: Payer: Self-pay | Admitting: Family Medicine

## 2023-02-06 ENCOUNTER — Telehealth: Payer: Self-pay | Admitting: Family Medicine

## 2023-02-06 NOTE — Telephone Encounter (Signed)
Okay for orders? 

## 2023-02-06 NOTE — Telephone Encounter (Signed)
Yes okay 

## 2023-02-06 NOTE — Telephone Encounter (Signed)
Caller/Agency: [facility] Rhea Pink Number: [phone] Requesting OT/PT/Skilled Nursing/Social Work/Speech Therapy: PT  Frequency: 1 times a week for 9 weeks

## 2023-02-07 NOTE — Telephone Encounter (Signed)
Left secure VM for Arlys John regarding approved PT orders for requested frequency. Office and fax number provided if additional questions or concerns.

## 2023-02-08 ENCOUNTER — Inpatient Hospital Stay: Payer: Medicare HMO | Admitting: Family Medicine

## 2023-02-09 DIAGNOSIS — Z4789 Encounter for other orthopedic aftercare: Secondary | ICD-10-CM | POA: Diagnosis not present

## 2023-02-10 ENCOUNTER — Ambulatory Visit (INDEPENDENT_AMBULATORY_CARE_PROVIDER_SITE_OTHER): Payer: Medicare HMO | Admitting: Family Medicine

## 2023-02-10 ENCOUNTER — Encounter: Payer: Self-pay | Admitting: Family Medicine

## 2023-02-10 VITALS — BP 120/69 | HR 64 | Wt 170.2 lb

## 2023-02-10 DIAGNOSIS — N3001 Acute cystitis with hematuria: Secondary | ICD-10-CM

## 2023-02-10 DIAGNOSIS — D649 Anemia, unspecified: Secondary | ICD-10-CM | POA: Diagnosis not present

## 2023-02-10 DIAGNOSIS — Z8781 Personal history of (healed) traumatic fracture: Secondary | ICD-10-CM

## 2023-02-10 LAB — POC URINALSYSI DIPSTICK (AUTOMATED)
Bilirubin, UA: NEGATIVE
Glucose, UA: NEGATIVE
Ketones, UA: NEGATIVE
Leukocytes, UA: NEGATIVE
Nitrite, UA: NEGATIVE
Protein, UA: NEGATIVE
Spec Grav, UA: 1.015 (ref 1.010–1.025)
Urobilinogen, UA: NEGATIVE U/dL — AB
pH, UA: 6 (ref 5.0–8.0)

## 2023-02-10 NOTE — Progress Notes (Signed)
02/10/2023  CC: No chief complaint on file.   Patient is a 72 y.o.  female who presents for  hospital follow up. Dates hospitalized: 10/26 to 02/01/2023 Days since d/c from hospital: 9 days Patient was discharged from hospital to SNF for rehab Reason for admission to hospital: Fall, sustained right hip fracture and required surgery.  Also found to have sepsis due to UTI.   I have reviewed patient's discharge summary plus pertinent specific notes, labs, and imaging from the hospitalization.  Mechanical fall from standing, diagnosed with right femoral neck fracture.  Orthopedist did ORIF/pinning 01/28/2023. Additionally, she had sepsis due to E. coli bacteremia , likely seeded from E. coli UTI.  She received aztreonam in the hospital and was discharged on Bactrim.  Acute renal failure superimposed on chronic stage II renal insufficiency, resolved with fluids. Hemoglobin on admission was 9.9, reached a nadir of 8.1, and was at 8.8 upon discharge.  Currently: Doing pretty well.  She is at home and her daughter is staying with her now.  She finished all her antibiotic. Feels like she has to urinate every couple of hours at night but otherwise in the daytime she has no problem with urinary frequency, urgency, or burning.  No gross hematuria, no flank pain. She has some intermittent right hip pain that she sometimes uses her hydrocodone for.  She got orthopedics follow-up yesterday and says all was well.  Medication reconciliation was done today and patient is taking meds as recommended by discharging hospitalist/specialist.   Discharge medication list: Albuterol inhaler, alprazolam 0.5 mg 3 times daily as needed, amoxicillin to take as needed 1 hour prior to dental work, aspirin 81 mg twice a day, atorvastatin 80 mg a day, Wellbutrin XL 150 mg a day, Celexa 20 mg twice daily, Pepcid 40 mg a day, Flonase, HCTZ 12.5 mg daily, Vicodin 7.5/325 1 tab every 4 hours as needed, Klor-Con 20 mEq daily,  Lamictal 300 mg daily, loratadine 10 mg daily, losartan 25 mg daily, meloxicam 15 mg daily as needed, Toprol-XL 100 mg a day, Singulair 10 mg a day, Zofran 4 mg every 8 hours as needed, pantoprazole 40 mg daily, Stiolto Respimat 2 puffs daily, Bactrim DS 1 tab twice daily x 3 days, and zolpidem 10 mg nightly.  ROS as above, plus--> no fevers, no CP, no SOB, no wheezing, no cough, no dizziness, no HAs, no rashes, no melena/hematochezia.  No polyuria or polydipsia.   No focal weakness, paresthesias, or tremors.   No recent changes in lower legs. No n/v/d or abd pain.  No palpitations.     PMH:  Past Medical History:  Diagnosis Date   Abnormal chest x-ray 06/30/2013   pulm vasc congest 05/2013     Acute encephalopathy 11/19/2017   Alcoholism in remission [facility])    states last alcohol 11 yrs ago   Anemia    Anxiety    Bipolar 1 disorder (HCC)    Admission for mania x 2 (most recent 11/2017)   Cancer (HCC)    Cervical spondylosis without myelopathy 12/19/2013   MRI 02/2014: multilevel DDD/spondylosis, not much change compared to prior MRI.  Pt not in favor of invasive therapy for her neck as of 04/2014.   Chest pain 07/11/2018   Cholelithiasis    COPD (chronic obstructive pulmonary disease) (HCC)    Moderate: anoro started 04/2017 by pulm, pt symptomatically improved and PFTs stable at f/u 06/2017.   Coronary artery spasm (HCC) 07/11/2018   nitrates=HA. Amlodipine=intol swelling.  Changed  to coreg 09/03/18 by cardiologist.   Depression    Dilutional hyponatremia    Endometritis 05/2017   possible; empiric tx with Flagyl by GYN.   Environmental allergies    Femoral neck fracture (HCC)    right, 01/28/23-->ORIF   Fibromyalgia    GERD (gastroesophageal reflux disease)    Hepatic steatosis 03/2019   u/s abd   Herpes zoster 08/07/2017   L scapular region   Hiatal hernia    History of adenomatous polyp of colon 04/2008; 08/2014   No high grade dysplasia: recall 5 yrs (Dr. Bosie Clos, [facility])    History of basal cell carcinoma excision    NOSE   History of Helicobacter pylori infection 04/2008   +gastric biopsy (gastritis but no metaplasia, dysplasia, or malignancy identified)   History of kidney stones    History of TIA (transient ischemic attack)    secondary to HELLP syndrome 1994 post c/s--  residual memory loss   Hyperlipidemia    statin started after her NSTEMI: goal  LDL <70.   Hypertension    Infection of urinary tract 06/05/2015   Internal hemorrhoids    Ischemic cardiomyopathy 05/2018   Echo EF 45-50% in context of NSTEMI from CA spasm. 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 Charlsie Merles 04/2017.   Leg pain    ABIs 11/2019: normal    Memory loss    Abnl MRI brain and CT brain c/w chronic microvascular ischemia.  Repeat MRI brain 02/2014 stable (Dr. Radonna Ricker with no plan to f/u with neuro as of 04/2014)   NSTEMI (non-ST elevated myocardial infarction) (HCC) 05/2018   Echo EF 45-50% in context of NSTEMI from CA spasm.// Myoview 8/21: EF 66, no ischemia, low risk   Osteoarthritis of left wrist    STT and 1st [facility] jt   Osteoporosis 05/2010   2012 'penia; 06/2015 'porosis:  prolia.  DEXA 09/2017 T-score -2.2 femur neck. 11/2021 T score -2.2. Rpt 2 yrs   Pelvic floor dysfunction    [facility] (Dr. Marlou Porch)   Pneumonia    Postmenopausal vaginal bleeding 05/2017   x 1 small episode; GYN attempted endo bx but unable to penetrate cervix due to severe cerv stenosis (due to postmenopausal state + hx of LEEP).  Endo u/s showed thin endo lining.  Per GYN---no evidence of endomet pathology on w/u---obs as of 07/2017.   Pre-diabetes    Prediabetes 06/2016   Fasting gluc 105; HbA1c at that time was 6.0%.  A1c 6.2% 12/2017.  A1c 5.7% Feb 2023.    Recurrent kidney stones 08/2013   Left ureteral calculus: Perc nephr--cystoscopy w/ureteroscopy + laser for stone removal.  Residual asymptomatic left renal nephrolithiasis <87mm present post-procedure.  Right sided hydronephrosis-persistent (on u/s)--urol ordered CT to further eval 08/2016: 1.6 cm nonobstructing stone lower pole left kidney, no hydro, plan for PCN extraction [facility])   Renal calculus 08/05/2013   Sepsis (HCC)    UTI   Shortness of breath    Sprain of neck 12/19/2013   Stroke [facility])    TIA   Suprapubic discomfort 06/05/2015   TMJ (temporomandibular joint disorder)    USES  MOUTH GUARD   Toe fracture, left 12/2017   2nd toe prox phalanx (immobilization, Dr. Luciana Axe)   Tubular adenoma of colon  Urinary frequency 08/01/2013   Vitamin D deficiency 05/2011   Weak urinary stream 07/2016   with elevated PVR and mild left hydronephrosis secondary to this ([facility] started flomax 0.4mg  qd for this).    PSH:  Past Surgical History:  Procedure Laterality Date   ABIs  11/29/2019   NORMAL   ANTERIOR CERVICAL DECOMP/DISCECTOMY FUSION  06/30/2009   C3 -- C5  AND EXPLORATION OF FUSION C5-7 W/  PLATE REMOVAL   ARTHRODESIS METATARSALPHALANGEAL JOINT (MTPJ) Left 07/08/2021   Procedure: Left hallux metatarsophalangeal joint arthrodesis;  Surgeon: Toni Arthurs, MD;  Location: [facility];  Service: Orthopedics;  Laterality: Left;   BACK SURGERY     BUNIONECTOMY Left    CARDIOVASCULAR STRESS TEST  12/03/2019   myoc perf im: NORMAL   CERVICAL FUSION  2002   anterior C5 -- C7   CERVICAL SPINE SURGERY  08/27/1999     C6 -- T1  LAMINECTOMY/  DISKECTOMY   CESAREAN SECTION  1994   CHOLECYSTECTOMY N/A 03/06/2020   Procedure: LAPAROSCOPIC CHOLECYSTECTOMY;  Surgeon: Quentin Ore, MD;  Location: [facility] OR;  Service: General;  Laterality: N/A;   COLONOSCOPY W/ POLYPECTOMY  04/2008;08/2014   2016 tubular adenoma x 1; +diverticulosis and int/ext hemorrhoids.  06/2020  adenoma, recall 5 yrs.   CYSTOSCOPY WITH URETEROSCOPY AND STENT PLACEMENT Left 08/28/2013   Procedure: CYSTOSCOPY, RGP,  WITH URETEROSCOPY AND STENT REMOVAL;  Surgeon: Valetta Fuller, MD;  Location: [facility];  Service: Urology;  Laterality: Left;   DEXA  09/2017   T-score -2.2 femur neck: improved compared to 06/2015.  2021 DEXA T score -2.3.  11/2021 DEXA T score -2.2.   ESOPHAGOGASTRODUODENOSCOPY  2010; 08/2014   2016 +Candidal esophagitis; Mild chronic gastritis w/intestinal metaplasia--NEG H pylori, neg for eosinophilic esoph. 06/2020 esoph candidiasis (diflucan x 20d), o/w normal.   HAMMER TOE SURGERY Left 07/08/2021   Procedure: Second metatarsal head excision; revision Second hammertoe correction; 2-5 percutaneus flexor tenotomies;  Surgeon: Toni Arthurs, MD;  Location: [facility];  Service: Orthopedics;  Laterality: Left;   HARDWARE REMOVAL Left 07/08/2021   Procedure: Removal of deep implants from hallux proximal phalanx and First and Second metatarsals;  Surgeon: Toni Arthurs, MD;  Location: [facility];  Service: Orthopedics;  Laterality: Left;   HIP PINNING,CANNULATED Right 01/28/2023   Procedure: PERCUTANEOUS FIXATION OF RIGHT FEMORAL NECK;  Surgeon: Yolonda Kida, MD;  Location: [facility] OR;  Service: Orthopedics;  Laterality: Right;   HOLMIUM LASER APPLICATION Left 08/28/2013   Procedure: HOLMIUM LASER APPLICATION;  Surgeon: Valetta Fuller, MD;  Location: [facility];  Service: Urology;  Laterality: Left;   LEFT HEART CATH AND CORONARY ANGIOGRAPHY N/A 05/15/2018   Evidence for spasm in distal LAD.  Mod RCA atherosclerosis, o/w no CAD.  Procedure: LEFT HEART CATH AND CORONARY ANGIOGRAPHY;  Surgeon: Marykay Lex, MD;  Location: [facility] INVASIVE CV LAB;  Service: Cardiovascular;  Laterality: N/A;   NEGATIVE SLEEP STUDY  04/01/2007   NEPHROLITHOTOMY Left 08/05/2013   Procedure: NEPHROLITHOTOMY PERCUTANEOUS;  Surgeon: Valetta Fuller, MD;  Location: [facility];  Service: Urology;  Laterality: Left;   POLYSOMNOGRAM  01/2019   NORMAL   POLYSOMNOGRAPHY  10/25/2018   Dr. Luetta Nutting due to pt inadequat sleep time (83 min).   TONSILLECTOMY AND ADENOIDECTOMY  1975   TOTAL KNEE ARTHROPLASTY Right 2003   TOTAL KNEE REVISION Right 01/19/2022   Procedure: Right knee polyethylene revision;  Surgeon: Lequita Halt,  Homero Fellers, MD;  Location: [facility];  Service: Orthopedics;  Laterality: Right;   TRANSTHORACIC ECHOCARDIOGRAM  05/2018; 12/03/2019   (after MI secondary to arterial spasm): EF 45-50%; severe hypokinesis of apical anterolateral, inferolateral , anterior, and inferior LV myocardium. 11/2019 EF 55-60%, valves fine   ULTRASOUND EXAM,PELVIC COMPLETE ([facility] HX)  06/25/2015   NORMAL (done by GYN)    MEDS:  Outpatient Medications Prior to Visit  Medication Sig Dispense Refill   acetaminophen (TYLENOL) 500 MG tablet Take 500-1,000 mg by mouth daily as needed for mild pain or fever.     albuterol (VENTOLIN HFA) 108 (90 Base) MCG/ACT inhaler Inhale 2 puffs into the lungs every 6 (six) hours as needed for wheezing or shortness of breath. (Patient not taking: Reported on 01/28/2023) 8 g 0   ALPRAZolam (XANAX) 0.5 MG tablet TAKE 1 TABLET BY MOUTH THREE TIMES A DAY AS NEEDED FOR ANXIETY 90 tablet 5   amoxicillin (AMOXIL) 500 MG capsule Take 2,000 mg by mouth daily as needed (1 hour prior to dental work).     aspirin EC 81 MG tablet Take 1 tablet (81 mg total) by mouth 2 (two) times daily. Swallow whole. 90 tablet 0   atorvastatin (LIPITOR) 80 MG tablet TAKE 1 TABLET BY MOUTH DAILY. (Patient taking differently: Take 80 mg by mouth every evening.) 90 tablet 3   buPROPion (WELLBUTRIN XL) 150 MG 24 hr tablet Take 150 mg by mouth daily.     citalopram (CELEXA) 20 MG tablet Take 1 tablet (20 mg total) by mouth in the morning and at bedtime.     famotidine (PEPCID) 40 MG tablet Take 1 tablet (40 mg total) by mouth daily. 90 tablet 3    fluticasone (FLONASE) 50 MCG/ACT nasal spray SPRAY 2 SPRAYS INTO EACH NOSTRIL EVERY DAY (Patient taking differently: Place 1 spray into both nostrils daily as needed for allergies.) 48 mL 1   hydrochlorothiazide (HYDRODIURIL) 12.5 MG tablet TAKE 1 TABLET BY MOUTH EVERY DAY 90 tablet 3   HYDROcodone-acetaminophen (NORCO) 7.5-325 MG tablet Take 1 tablet by mouth every 4 (four) hours as needed for moderate pain (pain score 4-6). 28 tablet 0   KLOR-CON M20 20 MEQ tablet TAKE 1 TABLET BY MOUTH EVERY DAY 90 tablet 1   lamoTRIgine (LAMICTAL) 100 MG tablet TAKE 3 TABLETS (300 MG TOTAL) BY MOUTH AT BEDTIME 270 tablet 2   loratadine (CLARITIN) 10 MG tablet Take 10 mg by mouth daily.     losartan (COZAAR) 25 MG tablet TAKE 1 TABLET BY MOUTH EVERY DAY 90 tablet 3   meloxicam (MOBIC) 15 MG tablet TAKE 1 TABLET BY MOUTH ONCE DAILY AS NEEDED FOR JOINT PAIN 30 tablet 2   metoprolol succinate (TOPROL-XL) 100 MG 24 hr tablet TAKE 1 TABLET BY MOUTH EVERY DAY WITH OR IMMEDIATELY FOLLOWING A MEAL 90 tablet 3   montelukast (SINGULAIR) 10 MG tablet TAKE 1 TABLET BY MOUTH EVERY DAY IN THE EVENING 90 tablet 1   ondansetron (ZOFRAN) 4 MG tablet Take 1 tablet (4 mg total) by mouth every 8 (eight) hours as needed for nausea or vomiting. 20 tablet 0   pantoprazole (PROTONIX) 40 MG tablet TAKE 1 TABLET (40 MG TOTAL) BY MOUTH DAILY. KEEP F/U APPT FOR ANY FURTHER REFILLS 30 tablet 5   Tiotropium Bromide-Olodaterol (STIOLTO RESPIMAT) 2.5-2.5 MCG/ACT AERS INHALE 2 EACH INTO THE LUNGS DAILY. 4 g 11   zolpidem (AMBIEN) 10 MG tablet TAKE 1 TABLET BY MOUTH EVERYDAY AT BEDTIME 90 tablet 1   No  facility-administered medications prior to visit.    Physical Exam    02/01/2023    9:32 AM 02/01/2023    8:30 AM 02/01/2023    5:31 AM  Vitals with BMI  Systolic 152 158 295  Diastolic 84 82 77  Pulse 86 84 86   Gen: Alert, well appearing.  Patient is oriented to person, place, time, and situation. Has a walker. CV: RRR, no m/r/g.    LUNGS: CTA bilat, nonlabored resps, good aeration in all lung fields. EXT: no clubbing or cyanosis.  no edema.    Pertinent labs/imaging Last CBC Lab Results  Component Value Date   WBC 10.2 02/01/2023   HGB 8.8 (L) 02/01/2023   HCT 27.1 (L) 02/01/2023   MCV 89.7 02/01/2023   MCH 29.1 02/01/2023   RDW 13.3 02/01/2023   PLT 283 02/01/2023   Lab Results  Component Value Date   IRON 84 04/29/2014   FERRITIN 27.6 04/29/2014   Last metabolic panel Lab Results  Component Value Date   GLUCOSE 94 02/01/2023   NA 137 02/01/2023   K 4.2 02/01/2023   CL 104 02/01/2023   CO2 26 02/01/2023   BUN 16 02/01/2023   CREATININE 0.87 02/01/2023   GFRNONAA >60 02/01/2023   CALCIUM 8.8 (L) 02/01/2023   PROT 5.6 (L) 01/30/2023   ALBUMIN 2.8 (L) 01/30/2023   BILITOT 0.5 01/30/2023   ALKPHOS 83 01/30/2023   AST 64 (H) 01/30/2023   ALT 38 01/30/2023   ANIONGAP 7 02/01/2023   Lab Results  Component Value Date   VITAMINB12 688 12/11/2017   ASSESSMENT/PLAN:  #1 E. coli sepsis, UTI. Status post antibiotic treatment in the hospital as well as 3 days of Bactrim after discharge home.  Asymptomatic at this time. Urine today with 2+ blood.  Urine sent for culture and susceptibility.  No antibiotics at this time.  2.  Normocytic anemia. Chronic (baseline hemoglobin around 10.5) but worse upon admission to the hospital.  Hemoglobin stable at 8.8 upon discharge. She does have a low iron intake.  No melena or hematochezia or gross hematuria. Recheck CBC.  Also check iron panel and vitamin B12 level.  Hemoccult cards as well.  3.  Right hip fracture.  She is status post ORIF and doing well. Pain control per orthopedic MD.  FOLLOW UP: Keep appointment set for 03/08/2023  Signed:  Santiago Bumpers, MD           02/10/2023

## 2023-02-11 LAB — CBC WITH DIFFERENTIAL/PLATELET
Absolute Lymphocytes: 1755 {cells}/uL (ref 850–3900)
Absolute Monocytes: 807 {cells}/uL (ref 200–950)
Basophils Absolute: 65 {cells}/uL (ref 0–200)
Basophils Relative: 0.6 %
Eosinophils Absolute: 251 {cells}/uL (ref 15–500)
Eosinophils Relative: 2.3 %
HCT: 27.7 % — ABNORMAL LOW (ref 35.0–45.0)
Hemoglobin: 9.3 g/dL — ABNORMAL LOW (ref 11.7–15.5)
MCH: 30.6 pg (ref 27.0–33.0)
MCHC: 33.6 g/dL (ref 32.0–36.0)
MCV: 91.1 fL (ref 80.0–100.0)
MPV: 8.6 fL (ref 7.5–12.5)
Monocytes Relative: 7.4 %
Neutro Abs: 8022 {cells}/uL — ABNORMAL HIGH (ref 1500–7800)
Neutrophils Relative %: 73.6 %
Platelets: 329 10*3/uL (ref 140–400)
RBC: 3.04 10*6/uL — ABNORMAL LOW (ref 3.80–5.10)
RDW: 12.9 % (ref 11.0–15.0)
Total Lymphocyte: 16.1 %
WBC: 10.9 10*3/uL — ABNORMAL HIGH (ref 3.8–10.8)

## 2023-02-11 LAB — URINE CULTURE
MICRO NUMBER:: 15706484
Result:: NO GROWTH
SPECIMEN QUALITY:: ADEQUATE

## 2023-02-11 LAB — IRON,TIBC AND FERRITIN PANEL
%SAT: 25 % (ref 16–45)
Ferritin: 234 ng/mL (ref 16–288)
Iron: 64 ug/dL (ref 45–160)
TIBC: 256 ug/dL (ref 250–450)

## 2023-02-11 LAB — VITAMIN B12: Vitamin B-12: 933 pg/mL (ref 200–1100)

## 2023-02-13 ENCOUNTER — Other Ambulatory Visit: Payer: Self-pay | Admitting: Family Medicine

## 2023-02-22 ENCOUNTER — Telehealth: Payer: Self-pay

## 2023-02-22 NOTE — Telephone Encounter (Signed)
Darl Pikes calling from [facility], she is visiting with patient at patient's home.  Darl Pikes states patient is running low grade fever 99.4 with Tylenol. Patient is lethargic,very tired. Darl Pikes is concerned based on last hospitalization and symptoms now. I offered to schedule an appt, she stated she is not sure patient is able.  Last visit 11/8, next visit with Dr. Milinda Cave on 12/3.  Please advise.  Darl Pikes can be reached at [phone]

## 2023-02-22 NOTE — Telephone Encounter (Signed)
I would need to see her in office. Do I have anything open 11/21 or 11/22?

## 2023-02-23 NOTE — Telephone Encounter (Signed)
LVM to discuss

## 2023-02-23 NOTE — Telephone Encounter (Signed)
Patient has to set up transportation with her friend. She will call to see when she is available today or tomorrow. She will call me back after she speaks with her friend.

## 2023-02-23 NOTE — Telephone Encounter (Signed)
Noted  

## 2023-02-23 NOTE — Progress Notes (Addendum)
OFFICE VISIT  02/24/2023  CC:  Chief Complaint  Patient presents with   Fever    Chills, aches, fever. Pt has not checked temp. Today. Has been taking Tylenol.     Patient is a 72 y.o. female who presents for fever. A/P as of last visit on 02/10/23: "1 E. coli sepsis, UTI. Status post antibiotic treatment in the hospital as well as 3 days of Bactrim after discharge home.  Asymptomatic at this time. Urine today with 2+ blood.  Urine sent for culture and susceptibility.  No antibiotics at this time.   2.  Normocytic anemia. Chronic (baseline hemoglobin around 10.5) but worse upon admission to the hospital.  Hemoglobin stable at 8.8 upon discharge. She does have a low iron intake.  No melena or hematochezia or gross hematuria. Recheck CBC.  Also check iron panel and vitamin B12 level.  Hemoccult cards as well.   3.  Right hip fracture.  She is status post ORIF and doing well. Pain control per orthopedic MD."  HPI: Urine clx last visit NO GROWTH. Last visit WBC 10.9, hb 9.3, plts 329K. Iron and b12 normal. Has not turned in Hemoccult cards yet.  Lately she has been feeling pretty tired but has felt this way since having her hip surgery. She feels some periods of hot and cold but has not checked her temperature.  Some of her feelings of hot and cold were because of being in severe pain. However, her pain is much better and her hip now. She has a little bit of right shin pain lately.  Says she has always had right shin pain on and off throughout her life. No urinary frequency, urgency, or burning.  No abnormal appearance or malodorous urine. No diarrhea, no URI or cough, no shortness of breath, no abdominal pain. She is eating and drinking well.  Her home health nurse evaluated her a couple days ago and felt like she had a fever. Olegario Messier does not remember the exact temperature but she knows it was less than 100.   Past Medical History:  Diagnosis Date   Abnormal chest x-ray  06/30/2013   pulm vasc congest 05/2013     Acute encephalopathy 11/19/2017   Alcoholism in remission [facility])    states last alcohol 11 yrs ago   Anemia    Anxiety    Bipolar 1 disorder (HCC)    Admission for mania x 2 (most recent 11/2017)   Cancer (HCC)    Cervical spondylosis without myelopathy 12/19/2013   MRI 02/2014: multilevel DDD/spondylosis, not much change compared to prior MRI.  Pt not in favor of invasive therapy for her neck as of 04/2014.   Chest pain 07/11/2018   Cholelithiasis    COPD (chronic obstructive pulmonary disease) (HCC)    Moderate: anoro started 04/2017 by pulm, pt symptomatically improved and PFTs stable at f/u 06/2017.   Coronary artery spasm (HCC) 07/11/2018   nitrates=HA. Amlodipine=intol swelling.  Changed to coreg 09/03/18 by cardiologist.   Depression    Dilutional hyponatremia    Endometritis 05/2017   possible; empiric tx with Flagyl by GYN.   Environmental allergies    Femoral neck fracture (HCC)    right, 01/28/23-->ORIF   Fibromyalgia    GERD (gastroesophageal reflux disease)    Hepatic steatosis 03/2019   u/s abd   Herpes zoster 08/07/2017   L scapular region   Hiatal hernia    History of adenomatous polyp of colon 04/2008; 08/2014   No high grade dysplasia:  recall 5 yrs (Dr. Bosie Clos, [facility])   History of basal cell carcinoma excision    NOSE   History of Helicobacter pylori infection 04/2008   +gastric biopsy (gastritis but no metaplasia, dysplasia, or malignancy identified)   History of kidney stones    History of TIA (transient ischemic attack)    secondary to HELLP syndrome 1994 post c/s--  residual memory loss   Hyperlipidemia    statin started after her NSTEMI: goal  LDL <70.   Hypertension    Infection of urinary tract 06/05/2015   Internal hemorrhoids    Ischemic cardiomyopathy 05/2018   Echo EF 45-50% in context of NSTEMI from CA spasm. // Echo 8/21: 55-60, mild LVH, normal RVSF, RVSP 40, trivial MR   Left foot pain    Hallux  deformity+adhesive capsulitis+ hammertoe:  Severe structural bunion deformity with hallux interphalangeus and severe arthritis of the second MPJ left foot--surgical repair/osteotomies by Dr. Charlsie Merles 04/2017.   Leg pain    ABIs 11/2019: normal    Memory loss    Abnl MRI brain and CT brain c/w chronic microvascular ischemia.  Repeat MRI brain 02/2014 stable (Dr. Radonna Ricker with no plan to f/u with neuro as of 04/2014)   NSTEMI (non-ST elevated myocardial infarction) (HCC) 05/2018   Echo EF 45-50% in context of NSTEMI from CA spasm.// Myoview 8/21: EF 66, no ischemia, low risk   Osteoarthritis of left wrist    STT and 1st [facility] jt   Osteoporosis 05/2010   2012 'penia; 06/2015 'porosis:  prolia.  DEXA 09/2017 T-score -2.2 femur neck. 11/2021 T score -2.2. Rpt 2 yrs   Pelvic floor dysfunction    [facility] (Dr. Marlou Porch)   Pneumonia    Postmenopausal vaginal bleeding 05/2017   x 1 small episode; GYN attempted endo bx but unable to penetrate cervix due to severe cerv stenosis (due to postmenopausal state + hx of LEEP).  Endo u/s showed thin endo lining.  Per GYN---no evidence of endomet pathology on w/u---obs as of 07/2017.   Pre-diabetes    Prediabetes 06/2016   Fasting gluc 105; HbA1c at that time was 6.0%.  A1c 6.2% 12/2017.  A1c 5.7% Feb 2023.   Recurrent kidney stones 08/2013   Left ureteral calculus: Perc nephr--cystoscopy w/ureteroscopy + laser for stone removal.  Residual asymptomatic left renal nephrolithiasis <65mm present post-procedure.  Right sided hydronephrosis-persistent (on u/s)--urol ordered CT to further eval 08/2016: 1.6 cm nonobstructing stone lower pole left kidney, no hydro, plan for PCN extraction [facility])   Renal calculus 08/05/2013   Sepsis (HCC)    UTI   Shortness of breath    Sprain of neck 12/19/2013   Stroke (HCC)    TIA   Suprapubic discomfort 06/05/2015   TMJ (temporomandibular joint disorder)    USES  MOUTH GUARD   Toe fracture, left 12/2017   2nd toe prox phalanx  (immobilization, Dr. Luciana Axe)   Tubular adenoma of colon    Urinary frequency 08/01/2013   Vitamin D deficiency 05/2011   Weak urinary stream 07/2016   with elevated PVR and mild left hydronephrosis secondary to this ([facility] started flomax 0.4mg  qd for this).    Past Surgical History:  Procedure Laterality Date   ABIs  11/29/2019   NORMAL   ANTERIOR CERVICAL DECOMP/DISCECTOMY FUSION  06/30/2009   C3 -- C5  AND EXPLORATION OF FUSION C5-7 W/  PLATE REMOVAL   ARTHRODESIS METATARSALPHALANGEAL JOINT (MTPJ) Left 07/08/2021   Procedure: Left hallux metatarsophalangeal joint arthrodesis;  Surgeon:  Toni Arthurs, MD;  Location: [facility];  Service: Orthopedics;  Laterality: Left;   BACK SURGERY     BUNIONECTOMY Left    CARDIOVASCULAR STRESS TEST  12/03/2019   myoc perf im: NORMAL   CERVICAL FUSION  2002   anterior C5 -- C7   CERVICAL SPINE SURGERY  08/27/1999     C6 -- T1  LAMINECTOMY/  DISKECTOMY   CESAREAN SECTION  1994   CHOLECYSTECTOMY N/A 03/06/2020   Procedure: LAPAROSCOPIC CHOLECYSTECTOMY;  Surgeon: Quentin Ore, MD;  Location: [facility];  Service: General;  Laterality: N/A;   COLONOSCOPY W/ POLYPECTOMY  04/2008;08/2014   2016 tubular adenoma x 1; +diverticulosis and int/ext hemorrhoids.  06/2020 adenoma, recall 5 yrs.   CYSTOSCOPY WITH URETEROSCOPY AND STENT PLACEMENT Left 08/28/2013   Procedure: CYSTOSCOPY, RGP,  WITH URETEROSCOPY AND STENT REMOVAL;  Surgeon: Valetta Fuller, MD;  Location: [facility];  Service: Urology;  Laterality: Left;   DEXA  09/2017   T-score -2.2 femur neck: improved compared to 06/2015.  2021 DEXA T score -2.3.  11/2021 DEXA T score -2.2.   ESOPHAGOGASTRODUODENOSCOPY  2010; 08/2014   2016 +Candidal esophagitis; Mild chronic gastritis w/intestinal metaplasia--NEG H pylori, neg for eosinophilic esoph. 06/2020 esoph candidiasis (diflucan x 20d), o/w normal.   HAMMER TOE SURGERY Left 07/08/2021   Procedure: Second metatarsal  head excision; revision Second hammertoe correction; 2-5 percutaneus flexor tenotomies;  Surgeon: Toni Arthurs, MD;  Location: [facility];  Service: Orthopedics;  Laterality: Left;   HARDWARE REMOVAL Left 07/08/2021   Procedure: Removal of deep implants from hallux proximal phalanx and First and Second metatarsals;  Surgeon: Toni Arthurs, MD;  Location: [facility];  Service: Orthopedics;  Laterality: Left;   HIP PINNING,CANNULATED Right 01/28/2023   Procedure: PERCUTANEOUS FIXATION OF RIGHT FEMORAL NECK;  Surgeon: Yolonda Kida, MD;  Location: [facility] OR;  Service: Orthopedics;  Laterality: Right;   HOLMIUM LASER APPLICATION Left 08/28/2013   Procedure: HOLMIUM LASER APPLICATION;  Surgeon: Valetta Fuller, MD;  Location: [facility];  Service: Urology;  Laterality: Left;   LEFT HEART CATH AND CORONARY ANGIOGRAPHY N/A 05/15/2018   Evidence for spasm in distal LAD.  Mod RCA atherosclerosis, o/w no CAD.  Procedure: LEFT HEART CATH AND CORONARY ANGIOGRAPHY;  Surgeon: Marykay Lex, MD;  Location: [facility] INVASIVE CV LAB;  Service: Cardiovascular;  Laterality: N/A;   NEGATIVE SLEEP STUDY  04/01/2007   NEPHROLITHOTOMY Left 08/05/2013   Procedure: NEPHROLITHOTOMY PERCUTANEOUS;  Surgeon: Valetta Fuller, MD;  Location: [facility];  Service: Urology;  Laterality: Left;   POLYSOMNOGRAM  01/2019   NORMAL   POLYSOMNOGRAPHY  10/25/2018   Dr. Luetta Nutting due to pt inadequat sleep time (83 min).   TONSILLECTOMY AND ADENOIDECTOMY  1975   TOTAL KNEE ARTHROPLASTY Right 2003   TOTAL KNEE REVISION Right 01/19/2022   Procedure: Right knee polyethylene revision;  Surgeon: Ollen Gross, MD;  Location: [facility];  Service: Orthopedics;  Laterality: Right;   TRANSTHORACIC ECHOCARDIOGRAM  05/2018; 12/03/2019   (after MI secondary to arterial spasm): EF 45-50%; severe hypokinesis of apical anterolateral, inferolateral , anterior, and inferior LV myocardium. 11/2019 EF  55-60%, valves fine   ULTRASOUND EXAM,PELVIC COMPLETE ([facility] HX)  06/25/2015   NORMAL (done by GYN)    Outpatient Medications Prior to Visit  Medication Sig Dispense Refill   acetaminophen (TYLENOL) 500 MG tablet Take 500-1,000 mg by mouth daily as needed for mild pain or fever.  albuterol (VENTOLIN HFA) 108 (90 Base) MCG/ACT inhaler Inhale 2 puffs into the lungs every 6 (six) hours as needed for wheezing or shortness of breath. 8 g 0   ALPRAZolam (XANAX) 0.5 MG tablet TAKE 1 TABLET BY MOUTH THREE TIMES A DAY AS NEEDED FOR ANXIETY 90 tablet 5   amoxicillin (AMOXIL) 500 MG capsule Take 2,000 mg by mouth daily as needed (1 hour prior to dental work).     aspirin EC 81 MG tablet Take 1 tablet (81 mg total) by mouth 2 (two) times daily. Swallow whole. 90 tablet 0   atorvastatin (LIPITOR) 80 MG tablet TAKE 1 TABLET BY MOUTH DAILY. (Patient taking differently: Take 80 mg by mouth every evening.) 90 tablet 3   buPROPion (WELLBUTRIN XL) 150 MG 24 hr tablet Take 150 mg by mouth daily.     citalopram (CELEXA) 20 MG tablet Take 1 tablet (20 mg total) by mouth in the morning and at bedtime.     citalopram (CELEXA) 40 MG tablet Take 40 mg by mouth daily.     famotidine (PEPCID) 40 MG tablet Take 1 tablet (40 mg total) by mouth daily. 90 tablet 3   fluticasone (FLONASE) 50 MCG/ACT nasal spray SPRAY 2 SPRAYS INTO EACH NOSTRIL EVERY DAY (Patient taking differently: Place 1 spray into both nostrils daily as needed for allergies.) 48 mL 1   hydrochlorothiazide (HYDRODIURIL) 12.5 MG tablet TAKE 1 TABLET BY MOUTH EVERY DAY 90 tablet 3   HYDROcodone-acetaminophen (NORCO) 7.5-325 MG tablet Take 1 tablet by mouth every 4 (four) hours as needed for moderate pain (pain score 4-6). 28 tablet 0   KLOR-CON M20 20 MEQ tablet TAKE 1 TABLET BY MOUTH EVERY DAY 90 tablet 1   lamoTRIgine (LAMICTAL) 100 MG tablet TAKE 3 TABLETS (300 MG TOTAL) BY MOUTH AT BEDTIME 270 tablet 2   loratadine (CLARITIN) 10 MG tablet Take 10 mg by  mouth daily.     losartan (COZAAR) 25 MG tablet TAKE 1 TABLET BY MOUTH EVERY DAY 90 tablet 3   meloxicam (MOBIC) 15 MG tablet TAKE 1 TABLET BY MOUTH ONCE DAILY AS NEEDED FOR JOINT PAIN 30 tablet 2   metoprolol succinate (TOPROL-XL) 100 MG 24 hr tablet TAKE 1 TABLET BY MOUTH EVERY DAY WITH OR IMMEDIATELY FOLLOWING A MEAL 90 tablet 3   montelukast (SINGULAIR) 10 MG tablet TAKE 1 TABLET BY MOUTH EVERY DAY IN THE EVENING 90 tablet 1   ondansetron (ZOFRAN) 4 MG tablet Take 1 tablet (4 mg total) by mouth every 8 (eight) hours as needed for nausea or vomiting. 20 tablet 0   pantoprazole (PROTONIX) 40 MG tablet TAKE 1 TABLET (40 MG TOTAL) BY MOUTH DAILY. KEEP F/U APPT FOR ANY FURTHER REFILLS 30 tablet 5   Tiotropium Bromide-Olodaterol (STIOLTO RESPIMAT) 2.5-2.5 MCG/ACT AERS INHALE 2 EACH INTO THE LUNGS DAILY. 4 g 11   zolpidem (AMBIEN) 10 MG tablet TAKE 1 TABLET BY MOUTH EVERYDAY AT BEDTIME 90 tablet 1   No facility-administered medications prior to visit.    Allergies  Allergen Reactions   Ceftin [Cefuroxime Axetil] Anaphylaxis and Swelling    Swelling of eyes and tongue   Cipro [Ciprofloxacin Hcl] Anaphylaxis and Swelling   Risperdal [Risperidone] Palpitations    Generalized weakness Malaise  Hypertension   Fosamax [Alendronate Sodium] Diarrhea   Linzess [Linaclotide] Diarrhea   Morphine And Codeine Other (See Comments)    Hallucinations Disorientation    Prednisone Other (See Comments)    Hyperactivity   Roxicodone [Oxycodone] Other (See Comments)  Previously addicted to medication   Seroquel [Quetiapine] Other (See Comments)    Oversedation     Review of Systems  As per HPI  PE:    02/24/2023   11:08 AM 02/10/2023   11:16 AM 02/01/2023    9:32 AM  Vitals with BMI  Weight 164 lbs 3 oz 170 lbs 3 oz   BMI  26.65   Systolic 125 120 962  Diastolic 80 69 84  Pulse 62 64 86     Physical Exam  Gen: Alert, well appearing.  Patient is oriented to person, place, time, and  situation. AFFECT: pleasant, lucid thought and speech. CV: RRR, no m/r/g.   LUNGS: Very soft insp crackles R base. Otherwise CTA bilat, nonlabored resps, good aeration in all lung fields. EXT: no clubbing or cyanosis.  no edema.    LABS:  Last CBC Lab Results  Component Value Date   WBC 10.9 (H) 02/10/2023   HGB 9.3 (L) 02/10/2023   HCT 27.7 (L) 02/10/2023   MCV 91.1 02/10/2023   MCH 30.6 02/10/2023   RDW 12.9 02/10/2023   PLT 329 02/10/2023   Lab Results  Component Value Date   IRON 64 02/10/2023   TIBC 256 02/10/2023   FERRITIN 234 02/10/2023   Lab Results  Component Value Date   VITAMINB12 933 02/10/2023    Lab Results  Component Value Date   IRON 64 02/10/2023   TIBC 256 02/10/2023   FERRITIN 234 02/10/2023   Lab Results  Component Value Date   VITAMINB12 933 02/10/2023   Last metabolic panel Lab Results  Component Value Date   GLUCOSE 94 02/01/2023   NA 137 02/01/2023   K 4.2 02/01/2023   CL 104 02/01/2023   CO2 26 02/01/2023   BUN 16 02/01/2023   CREATININE 0.87 02/01/2023   GFRNONAA >60 02/01/2023   CALCIUM 8.8 (L) 02/01/2023   PROT 5.6 (L) 01/30/2023   ALBUMIN 2.8 (L) 01/30/2023   BILITOT 0.5 01/30/2023   ALKPHOS 83 01/30/2023   AST 64 (H) 01/30/2023   ALT 38 01/30/2023   ANIONGAP 7 02/01/2023   IMPRESSION AND PLAN:  #1 hot and cold spells. Question of fever per home health nurse but unknown exact temp. No symptoms of UTI.  She was unable to give a urine specimen today so we gave her a cup to take home and bring back later today. She has some very subtle right base crackles that sound more consistent with atelectasis than infiltrate. She does not have any clinical picture consistent with pneumonia at this time. Our plan is clinical observation, no new medications or testing at this time.  I will see her for follow-up in 3 to 5 days.  (Also, plan for her to keep f/u set for 03/08/23)  #2 Normocytic anemia. Chronic (baseline hemoglobin  around 10.5) but worse upon admission to the hospital.  Hemoglobin stable at 8.8 upon discharge, 9.3 on hosp f/u. She does have a low iron intake.  No melena or hematochezia or gross hematuria. Iron and B12 levels normal.  Hemoccult cards have been ordered but not turned in yet.  An After Visit Summary was printed and given to the patient.  FOLLOW UP: Return for 3-5 days f/u fever/lungs.  Signed:  Santiago Bumpers, MD           02/24/2023  Addendum: Urinalysis normal. Reassured. Signed:  Santiago Bumpers, MD           02/24/2023

## 2023-02-24 ENCOUNTER — Encounter: Payer: Self-pay | Admitting: Family Medicine

## 2023-02-24 ENCOUNTER — Ambulatory Visit (INDEPENDENT_AMBULATORY_CARE_PROVIDER_SITE_OTHER): Payer: Medicare HMO | Admitting: Family Medicine

## 2023-02-24 VITALS — BP 125/80 | HR 62 | Temp 97.7°F | Wt 164.2 lb

## 2023-02-24 DIAGNOSIS — D72825 Bandemia: Secondary | ICD-10-CM

## 2023-02-24 DIAGNOSIS — Z8619 Personal history of other infectious and parasitic diseases: Secondary | ICD-10-CM | POA: Diagnosis not present

## 2023-02-24 DIAGNOSIS — R5081 Fever presenting with conditions classified elsewhere: Secondary | ICD-10-CM

## 2023-02-24 DIAGNOSIS — D649 Anemia, unspecified: Secondary | ICD-10-CM | POA: Diagnosis not present

## 2023-02-24 DIAGNOSIS — N3000 Acute cystitis without hematuria: Secondary | ICD-10-CM | POA: Diagnosis not present

## 2023-02-24 DIAGNOSIS — E8809 Other disorders of plasma-protein metabolism, not elsewhere classified: Secondary | ICD-10-CM

## 2023-02-24 LAB — POC URINALSYSI DIPSTICK (AUTOMATED)
Bilirubin, UA: NEGATIVE
Blood, UA: NEGATIVE
Glucose, UA: NEGATIVE
Ketones, UA: NEGATIVE
Leukocytes, UA: NEGATIVE
Nitrite, UA: NEGATIVE
Protein, UA: NEGATIVE
Spec Grav, UA: 1.015 (ref 1.010–1.025)
Urobilinogen, UA: NEGATIVE U/dL — AB
pH, UA: 6 (ref 5.0–8.0)

## 2023-02-24 NOTE — Addendum Note (Signed)
Addended by: Arty Baumgartner A on: 02/24/2023 03:49 PM   Modules accepted: Orders

## 2023-02-25 LAB — URINE CULTURE
MICRO NUMBER:: 15768990
Result:: NO GROWTH
SPECIMEN QUALITY:: ADEQUATE

## 2023-02-27 ENCOUNTER — Telehealth (INDEPENDENT_AMBULATORY_CARE_PROVIDER_SITE_OTHER): Payer: Medicare HMO | Admitting: Family Medicine

## 2023-02-27 ENCOUNTER — Encounter: Payer: Self-pay | Admitting: Family Medicine

## 2023-02-27 DIAGNOSIS — D508 Other iron deficiency anemias: Secondary | ICD-10-CM | POA: Diagnosis not present

## 2023-02-27 DIAGNOSIS — R5081 Fever presenting with conditions classified elsewhere: Secondary | ICD-10-CM | POA: Diagnosis not present

## 2023-02-27 DIAGNOSIS — R509 Fever, unspecified: Secondary | ICD-10-CM

## 2023-02-27 NOTE — Progress Notes (Signed)
Virtual Visit via Video Note  I connected withNAME@  on 02/27/23 at 11:20 AM EST by a video enabled telemedicine application and verified that I am speaking with the correct person using two identifiers.  Location patient: [place] Location provider:work or home office Persons participating in the virtual visit: patient, provider  I discussed the limitations and requested verbal permission for telemedicine visit. The patient expressed understanding and agreed to proceed.   HPI: 72 year old female being seen today for 3-day follow-up fever/fatigue. 1) hot and cold spells. Question of fever per home health nurse but unknown exact temp. No symptoms of UTI.  She was unable to give a urine specimen today so we gave her a cup to take home and bring back later today. She has some very subtle right base crackles that sound more consistent with atelectasis than infiltrate. She does not have any clinical picture consistent with pneumonia at this time. Our plan is clinical observation, no new medications or testing at this time.  I will see her for follow-up in 3 to 5 days.  (Also, plan for her to keep f/u set for 03/08/23)   2) Normocytic anemia. Chronic (baseline hemoglobin around 10.5) but worse upon admission to the hospital.  Hemoglobin stable at 8.8 upon discharge, 9.3 on hosp f/u. She does have a low iron intake.  No melena or hematochezia or gross hematuria. Iron and B12 levels normal.  Hemoccult cards have been ordered but not turned in yet.  INTERIM HX: Has felt fine over the weekend other than fatigue approp for recent illness/hip fracture/recovering, etc. No fever or SOB. Gets tired with ambulating with walker, appropriate. No cough, no urinary complaints. ROS: See pertinent positives and negatives per HPI.  Past Medical History:  Diagnosis Date   Abnormal chest x-ray 06/30/2013   pulm vasc congest 05/2013     Acute encephalopathy 11/19/2017   Alcoholism in remission [facility])    states  last alcohol 11 yrs ago   Anemia    Anxiety    Bipolar 1 disorder (HCC)    Admission for mania x 2 (most recent 11/2017)   Cancer (HCC)    Cervical spondylosis without myelopathy 12/19/2013   MRI 02/2014: multilevel DDD/spondylosis, not much change compared to prior MRI.  Pt not in favor of invasive therapy for her neck as of 04/2014.   Chest pain 07/11/2018   Cholelithiasis    COPD (chronic obstructive pulmonary disease) (HCC)    Moderate: anoro started 04/2017 by pulm, pt symptomatically improved and PFTs stable at f/u 06/2017.   Coronary artery spasm (HCC) 07/11/2018   nitrates=HA. Amlodipine=intol swelling.  Changed to coreg 09/03/18 by cardiologist.   Depression    Dilutional hyponatremia    Endometritis 05/2017   possible; empiric tx with Flagyl by GYN.   Environmental allergies    Femoral neck fracture (HCC)    right, 01/28/23-->ORIF   Fibromyalgia    GERD (gastroesophageal reflux disease)    Hepatic steatosis 03/2019   u/s abd   Herpes zoster 08/07/2017   L scapular region   Hiatal hernia    History of adenomatous polyp of colon 04/2008; 08/2014   No high grade dysplasia: recall 5 yrs (Dr. Bosie Clos, [facility])   History of basal cell carcinoma excision    NOSE   History of Helicobacter pylori infection 04/2008   +gastric biopsy (gastritis but no metaplasia, dysplasia, or malignancy identified)   History of kidney stones    History of TIA (transient ischemic attack)  secondary to HELLP syndrome 1994 post c/s--  residual memory loss   Hyperlipidemia    statin started after her NSTEMI: goal  LDL <70.   Hypertension    Infection of urinary tract 06/05/2015   Internal hemorrhoids    Ischemic cardiomyopathy 05/2018   Echo EF 45-50% in context of NSTEMI from CA spasm. // Echo 8/21: 55-60, mild LVH, normal RVSF, RVSP 40, trivial MR   Left foot pain    Hallux deformity+adhesive capsulitis+ hammertoe:  Severe structural bunion deformity with hallux interphalangeus and severe  arthritis of the second MPJ left foot--surgical repair/osteotomies by Dr. Charlsie Merles 04/2017.   Leg pain    ABIs 11/2019: normal    Memory loss    Abnl MRI brain and CT brain c/w chronic microvascular ischemia.  Repeat MRI brain 02/2014 stable (Dr. Radonna Ricker with no plan to f/u with neuro as of 04/2014)   NSTEMI (non-ST elevated myocardial infarction) (HCC) 05/2018   Echo EF 45-50% in context of NSTEMI from CA spasm.// Myoview 8/21: EF 66, no ischemia, low risk   Osteoarthritis of left wrist    STT and 1st [facility] jt   Osteoporosis 05/2010   2012 'penia; 06/2015 'porosis:  prolia.  DEXA 09/2017 T-score -2.2 femur neck. 11/2021 T score -2.2. Rpt 2 yrs   Pelvic floor dysfunction    [facility] (Dr. Marlou Porch)   Pneumonia    Postmenopausal vaginal bleeding 05/2017   x 1 small episode; GYN attempted endo bx but unable to penetrate cervix due to severe cerv stenosis (due to postmenopausal state + hx of LEEP).  Endo u/s showed thin endo lining.  Per GYN---no evidence of endomet pathology on w/u---obs as of 07/2017.   Pre-diabetes    Prediabetes 06/2016   Fasting gluc 105; HbA1c at that time was 6.0%.  A1c 6.2% 12/2017.  A1c 5.7% Feb 2023.   Recurrent kidney stones 08/2013   Left ureteral calculus: Perc nephr--cystoscopy w/ureteroscopy + laser for stone removal.  Residual asymptomatic left renal nephrolithiasis <40mm present post-procedure.  Right sided hydronephrosis-persistent (on u/s)--urol ordered CT to further eval 08/2016: 1.6 cm nonobstructing stone lower pole left kidney, no hydro, plan for PCN extraction [facility])   Renal calculus 08/05/2013   Sepsis (HCC)    UTI   Shortness of breath    Sprain of neck 12/19/2013   Stroke (HCC)    TIA   Suprapubic discomfort 06/05/2015   TMJ (temporomandibular joint disorder)    USES  MOUTH GUARD   Toe fracture, left 12/2017   2nd toe prox phalanx (immobilization, Dr. Luciana Axe)   Tubular adenoma of colon    Urinary frequency 08/01/2013   Vitamin D deficiency  05/2011   Weak urinary stream 07/2016   with elevated PVR and mild left hydronephrosis secondary to this ([facility] started flomax 0.4mg  qd for this).    Past Surgical History:  Procedure Laterality Date   ABIs  11/29/2019   NORMAL   ANTERIOR CERVICAL DECOMP/DISCECTOMY FUSION  06/30/2009   C3 -- C5  AND EXPLORATION OF FUSION C5-7 W/  PLATE REMOVAL   ARTHRODESIS METATARSALPHALANGEAL JOINT (MTPJ) Left 07/08/2021   Procedure: Left hallux metatarsophalangeal joint arthrodesis;  Surgeon: Toni Arthurs, MD;  Location: [facility];  Service: Orthopedics;  Laterality: Left;   BACK SURGERY     BUNIONECTOMY Left    CARDIOVASCULAR STRESS TEST  12/03/2019   myoc perf im: NORMAL   CERVICAL FUSION  2002   anterior C5 -- C7   CERVICAL SPINE SURGERY  08/27/1999     C6 -- T1  LAMINECTOMY/  DISKECTOMY   CESAREAN SECTION  1994   CHOLECYSTECTOMY N/A 03/06/2020   Procedure: LAPAROSCOPIC CHOLECYSTECTOMY;  Surgeon: Quentin Ore, MD;  Location: [facility];  Service: General;  Laterality: N/A;   COLONOSCOPY W/ POLYPECTOMY  04/2008;08/2014   2016 tubular adenoma x 1; +diverticulosis and int/ext hemorrhoids.  06/2020 adenoma, recall 5 yrs.   CYSTOSCOPY WITH URETEROSCOPY AND STENT PLACEMENT Left 08/28/2013   Procedure: CYSTOSCOPY, RGP,  WITH URETEROSCOPY AND STENT REMOVAL;  Surgeon: Valetta Fuller, MD;  Location[facility];  Service: Urology;  Laterality: Left;   DEXA  09/2017   T-score -2.2 femur neck: improved compared to 06/2015.  2021 DEXA T score -2.3.  11/2021 DEXA T score -2.2.   ESOPHAGOGASTRODUODENOSCOPY  2010; 08/2014   2016 +Candidal esophagitis; Mild chronic gastritis w/intestinal metaplasia--NEG H pylori, neg for eosinophilic esoph. 06/2020 esoph candidiasis (diflucan x 20d), o/w normal.   HAMMER TOE SURGERY Left 07/08/2021   Procedure: Second metatarsal head excision; revision Second hammertoe correction; 2-5 percutaneus flexor tenotomies;  Surgeon: Toni Arthurs,  MD;  Location: [facility];  Service: Orthopedics;  Laterality: Left;   HARDWARE REMOVAL Left 07/08/2021   Procedure: Removal of deep implants from hallux proximal phalanx and First and Second metatarsals;  Surgeon: Toni Arthurs, MD;  Location: [facility];  Service: Orthopedics;  Laterality: Left;   HIP PINNING,CANNULATED Right 01/28/2023   Procedure: PERCUTANEOUS FIXATION OF RIGHT FEMORAL NECK;  Surgeon: Yolonda Kida, MD;  Location: [facility];  Service: Orthopedics;  Laterality: Right;   HOLMIUM LASER APPLICATION Left 08/28/2013   Procedure: HOLMIUM LASER APPLICATION;  Surgeon: Valetta Fuller, MD;  Location: [facility];  Service: Urology;  Laterality: Left;   LEFT HEART CATH AND CORONARY ANGIOGRAPHY N/A 05/15/2018   Evidence for spasm in distal LAD.  Mod RCA atherosclerosis, o/w no CAD.  Procedure: LEFT HEART CATH AND CORONARY ANGIOGRAPHY;  Surgeon: Marykay Lex, MD;  Location: [facility];  Service: Cardiovascular;  Laterality: N/A;   NEGATIVE SLEEP STUDY  04/01/2007   NEPHROLITHOTOMY Left 08/05/2013   Procedure: NEPHROLITHOTOMY PERCUTANEOUS;  Surgeon: Valetta Fuller, MD;  Location: [facility];  Service: Urology;  Laterality: Left;   POLYSOMNOGRAM  01/2019   NORMAL   POLYSOMNOGRAPHY  10/25/2018   Dr. Luetta Nutting due to pt inadequat sleep time (83 min).   TONSILLECTOMY AND ADENOIDECTOMY  1975   TOTAL KNEE ARTHROPLASTY Right 2003   TOTAL KNEE REVISION Right 01/19/2022   Procedure: Right knee polyethylene revision;  Surgeon: Ollen Gross, MD;  Location: [facility];  Service: Orthopedics;  Laterality: Right;   TRANSTHORACIC ECHOCARDIOGRAM  05/2018; 12/03/2019   (after MI secondary to arterial spasm): EF 45-50%; severe hypokinesis of apical anterolateral, inferolateral , anterior, and inferior LV myocardium. 11/2019 EF 55-60%, valves fine   ULTRASOUND EXAM,PELVIC COMPLETE ([facility] HX)  06/25/2015   NORMAL (done by GYN)      Current Outpatient Medications:    acetaminophen (TYLENOL) 500 MG tablet, Take 500-1,000 mg by mouth daily as needed for mild pain or fever., Disp: , Rfl:    albuterol (VENTOLIN HFA) 108 (90 Base) MCG/ACT inhaler, Inhale 2 puffs into the lungs every 6 (six) hours as needed for wheezing or shortness of breath., Disp: 8 g, Rfl: 0   ALPRAZolam (XANAX) 0.5 MG tablet, TAKE 1 TABLET BY MOUTH THREE TIMES A DAY AS NEEDED FOR ANXIETY, Disp: 90 tablet, Rfl: 5  amoxicillin (AMOXIL) 500 MG capsule, Take 2,000 mg by mouth daily as needed (1 hour prior to dental work)., Disp: , Rfl:    aspirin EC 81 MG tablet, Take 1 tablet (81 mg total) by mouth 2 (two) times daily. Swallow whole., Disp: 90 tablet, Rfl: 0   atorvastatin (LIPITOR) 80 MG tablet, TAKE 1 TABLET BY MOUTH DAILY. (Patient taking differently: Take 80 mg by mouth every evening.), Disp: 90 tablet, Rfl: 3   buPROPion (WELLBUTRIN XL) 150 MG 24 hr tablet, Take 150 mg by mouth daily., Disp: , Rfl:    citalopram (CELEXA) 20 MG tablet, Take 1 tablet (20 mg total) by mouth in the morning and at bedtime., Disp: , Rfl:    famotidine (PEPCID) 40 MG tablet, Take 1 tablet (40 mg total) by mouth daily., Disp: 90 tablet, Rfl: 3   fluticasone (FLONASE) 50 MCG/ACT nasal spray, SPRAY 2 SPRAYS INTO EACH NOSTRIL EVERY DAY (Patient taking differently: Place 1 spray into both nostrils daily as needed for allergies.), Disp: 48 mL, Rfl: 1   hydrochlorothiazide (HYDRODIURIL) 12.5 MG tablet, TAKE 1 TABLET BY MOUTH EVERY DAY, Disp: 90 tablet, Rfl: 3   HYDROcodone-acetaminophen (NORCO) 7.5-325 MG tablet, Take 1 tablet by mouth every 4 (four) hours as needed for moderate pain (pain score 4-6)., Disp: 28 tablet, Rfl: 0   KLOR-CON M20 20 MEQ tablet, TAKE 1 TABLET BY MOUTH EVERY DAY, Disp: 90 tablet, Rfl: 1   lamoTRIgine (LAMICTAL) 100 MG tablet, TAKE 3 TABLETS (300 MG TOTAL) BY MOUTH AT BEDTIME, Disp: 270 tablet, Rfl: 2   loratadine (CLARITIN) 10 MG tablet, Take 10 mg by mouth  daily., Disp: , Rfl:    losartan (COZAAR) 25 MG tablet, TAKE 1 TABLET BY MOUTH EVERY DAY, Disp: 90 tablet, Rfl: 3   meloxicam (MOBIC) 15 MG tablet, TAKE 1 TABLET BY MOUTH ONCE DAILY AS NEEDED FOR JOINT PAIN, Disp: 30 tablet, Rfl: 2   metoprolol succinate (TOPROL-XL) 100 MG 24 hr tablet, TAKE 1 TABLET BY MOUTH EVERY DAY WITH OR IMMEDIATELY FOLLOWING A MEAL, Disp: 90 tablet, Rfl: 3   montelukast (SINGULAIR) 10 MG tablet, TAKE 1 TABLET BY MOUTH EVERY DAY IN THE EVENING, Disp: 90 tablet, Rfl: 1   ondansetron (ZOFRAN) 4 MG tablet, Take 1 tablet (4 mg total) by mouth every 8 (eight) hours as needed for nausea or vomiting., Disp: 20 tablet, Rfl: 0   pantoprazole (PROTONIX) 40 MG tablet, TAKE 1 TABLET (40 MG TOTAL) BY MOUTH DAILY. KEEP F/U APPT FOR ANY FURTHER REFILLS, Disp: 30 tablet, Rfl: 5   Tiotropium Bromide-Olodaterol (STIOLTO RESPIMAT) 2.5-2.5 MCG/ACT AERS, INHALE 2 EACH INTO THE LUNGS DAILY., Disp: 4 g, Rfl: 11   zolpidem (AMBIEN) 10 MG tablet, TAKE 1 TABLET BY MOUTH EVERYDAY AT BEDTIME, Disp: 90 tablet, Rfl: 1  EXAM:  VITALS per patient if applicable:     02/24/2023   11:08 AM 02/10/2023   11:16 AM 02/01/2023    9:32 AM  Vitals with BMI  Weight 164 lbs 3 oz 170 lbs 3 oz   BMI  26.65   Systolic 125 120 308  Diastolic 80 69 84  Pulse 62 64 86     GENERAL: alert, oriented, appears well and in no acute distress  HEENT: atraumatic, conjunttiva clear, no obvious abnormalities on inspection of external nose and ears  NECK: normal movements of the head and neck  LUNGS: on inspection no signs of respiratory distress, breathing rate appears normal, no obvious gross SOB, gasping or wheezing  CV:  no obvious cyanosis  MS: moves all visible extremities without noticeable abnormality  PSYCH/NEURO: pleasant and cooperative, no obvious depression or anxiety, speech and thought processing grossly intact  ASSESSMENT AND PLAN:  Discussed the following assessment and plan:  Question of recent  fever.  This was never clearly/definitively verified. No sign of illness.  She has been doing well over the weekend. 3 days ago her urinalysis was normal and urine culture showed no growth. No changes today.   I discussed the assessment and treatment plan with the patient. The patient was provided an opportunity to ask questions and all were answered. The patient agreed with the plan and demonstrated an understanding of the instructions.   F/u: Keep plan for appointment scheduled 03/08/2023.  Signed:  Santiago Bumpers, MD           02/27/2023

## 2023-03-07 ENCOUNTER — Telehealth: Payer: Self-pay | Admitting: Family Medicine

## 2023-03-07 NOTE — Telephone Encounter (Signed)
[facility] form faxed  plan of care, to be filled out by provider. Patient requested to send it back via Fax within ASAP. Document is located in providers tray at front office.Please advise at Mobile [phone] (mobile)

## 2023-03-08 ENCOUNTER — Telehealth: Payer: Self-pay

## 2023-03-08 ENCOUNTER — Ambulatory Visit: Payer: Medicare HMO | Admitting: Family Medicine

## 2023-03-08 ENCOUNTER — Encounter: Payer: Self-pay | Admitting: Family Medicine

## 2023-03-08 VITALS — BP 115/72 | HR 72 | Wt 169.4 lb

## 2023-03-08 DIAGNOSIS — F411 Generalized anxiety disorder: Secondary | ICD-10-CM

## 2023-03-08 DIAGNOSIS — F319 Bipolar disorder, unspecified: Secondary | ICD-10-CM

## 2023-03-08 DIAGNOSIS — I10 Essential (primary) hypertension: Secondary | ICD-10-CM

## 2023-03-08 DIAGNOSIS — R4789 Other speech disturbances: Secondary | ICD-10-CM

## 2023-03-08 NOTE — Telephone Encounter (Signed)
Good Afternoon!  A neurology referral was placed for pt regarding neurocognitive testing; pt was told facility she was originally referred to can no longer do test needed due to staffing issues. Is there any other facilities that can complete testing? Please advise.

## 2023-03-08 NOTE — Progress Notes (Unsigned)
OFFICE VISIT  03/08/2023  CC:  Chief Complaint  Patient presents with   Anxiety    F/U.     Patient is a 72 y.o. female who presents for 6-week follow-up cognitive impairment, bipolar disorder and GAD, hypertension. A/P as of last visit: "1 Word finding difficulty, short-term memory impairment. This is mild but bothers her significantly. Ordered referral to neuropsychology for neuropsychiatric testing.   #2 bipolar disorder, GAD. Continue Wellbutrin XL 300 mg a day, citalopram 20 mg bid, and Lamictal 300 mg a day.  Also continue alprazolam 0.5 mg 3 times daily as needed. Continue Ambien 10 mg nightly for insomnia.   3.  Acute bronchopneumonia, resolving appropriately.  At last visit her lung crackles remained. Chest x-ray was done 01/12/2023 and is normal per my interpretation.  Awaiting radiologist overread.   4.  Hypertension, well-controlled on Toprol-XL 100 mg a day, losartan 25 mg a day, and HCTZ 12.5 mg a day.   #5 hypercholesterolemia, doing well on a atorvastatin 80 mg a day. Lipid panel and hepatic panel today."  INTERIM HX: She feels stable from a mood and anxiety standpoint. Cough has essentially resolved.  Still feels some fatigue. Says she never got contacted about setting up neurocognitive testing.  Having increased pain in the right side of her neck and trapezius region with a little bit of paresthesias in this area.  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  Right hip hurting mostly in the lateral aspect.  ROS as above, plus--> no fevers, no CP, no SOB, no wheezing, no cough, no dizziness, no HAs, no rashes, no melena/hematochezia.    No focal weakness, paresthesias, or tremors.  No acute vision or hearing abnormalities.  No dysuria or unusual/new urinary urgency or frequency.  No recent changes in lower legs. No n/v/d or abd pain.  No palpitations.    Past Medical History:  Diagnosis Date   Abnormal chest x-ray  06/30/2013   pulm vasc congest 05/2013     Acute encephalopathy 11/19/2017   Alcoholism in remission [facility])    states last alcohol 11 yrs ago   Anemia    Anxiety    Bipolar 1 disorder (HCC)    Admission for mania x 2 (most recent 11/2017)   Cancer (HCC)    Cervical spondylosis without myelopathy 12/19/2013   MRI 02/2014: multilevel DDD/spondylosis, not much change compared to prior MRI.  Pt not in favor of invasive therapy for her neck as of 04/2014.   Chest pain 07/11/2018   Cholelithiasis    COPD (chronic obstructive pulmonary disease) (HCC)    Moderate: anoro started 04/2017 by pulm, pt symptomatically improved and PFTs stable at f/u 06/2017.   Coronary artery spasm (HCC) 07/11/2018   nitrates=HA. Amlodipine=intol swelling.  Changed to coreg 09/03/18 by cardiologist.   Depression    Dilutional hyponatremia    Endometritis 05/2017   possible; empiric tx with Flagyl by GYN.   Environmental allergies    Femoral neck fracture (HCC)    right, 01/28/23-->ORIF   Fibromyalgia    GERD (gastroesophageal reflux disease)    Hepatic steatosis 03/2019   u/s abd   Herpes zoster 08/07/2017   L scapular region   Hiatal hernia    History of adenomatous polyp of colon 04/2008; 08/2014   No high grade dysplasia: recall 5 yrs (Dr. Bosie Clos, [facility])   History of basal cell carcinoma excision    NOSE  History of Helicobacter pylori infection 04/2008   +gastric biopsy (gastritis but no metaplasia, dysplasia, or malignancy identified)   History of kidney stones    History of TIA (transient ischemic attack)    secondary to HELLP syndrome 1994 post c/s--  residual memory loss   Hyperlipidemia    statin started after her NSTEMI: goal  LDL <70.   Hypertension    Infection of urinary tract 06/05/2015   Internal hemorrhoids    Ischemic cardiomyopathy 05/2018   Echo EF 45-50% in context of NSTEMI from CA spasm. // Echo 8/21: 55-60, mild LVH, normal RVSF, RVSP 40, trivial MR   Left foot pain    Hallux  deformity+adhesive capsulitis+ hammertoe:  Severe structural bunion deformity with hallux interphalangeus and severe arthritis of the second MPJ left foot--surgical repair/osteotomies by Dr. Charlsie Merles 04/2017.   Leg pain    ABIs 11/2019: normal    Memory loss    Abnl MRI brain and CT brain c/w chronic microvascular ischemia.  Repeat MRI brain 02/2014 stable (Dr. Radonna Ricker with no plan to f/u with neuro as of 04/2014)   NSTEMI (non-ST elevated myocardial infarction) (HCC) 05/2018   Echo EF 45-50% in context of NSTEMI from CA spasm.// Myoview 8/21: EF 66, no ischemia, low risk   Osteoarthritis of left wrist    STT and 1st [facility] jt   Osteoporosis 05/2010   2012 'penia; 06/2015 'porosis:  prolia.  DEXA 09/2017 T-score -2.2 femur neck. 11/2021 T score -2.2. Rpt 2 yrs   Pelvic floor dysfunction    [facility] (Dr. Marlou Porch)   Pneumonia    Postmenopausal vaginal bleeding 05/2017   x 1 small episode; GYN attempted endo bx but unable to penetrate cervix due to severe cerv stenosis (due to postmenopausal state + hx of LEEP).  Endo u/s showed thin endo lining.  Per GYN---no evidence of endomet pathology on w/u---obs as of 07/2017.   Pre-diabetes    Prediabetes 06/2016   Fasting gluc 105; HbA1c at that time was 6.0%.  A1c 6.2% 12/2017.  A1c 5.7% Feb 2023.   Recurrent kidney stones 08/2013   Left ureteral calculus: Perc nephr--cystoscopy w/ureteroscopy + laser for stone removal.  Residual asymptomatic left renal nephrolithiasis <82mm present post-procedure.  Right sided hydronephrosis-persistent (on u/s)--urol ordered CT to further eval 08/2016: 1.6 cm nonobstructing stone lower pole left kidney, no hydro, plan for PCN extraction [facility])   Renal calculus 08/05/2013   Sepsis (HCC)    UTI   Shortness of breath    Sprain of neck 12/19/2013   Stroke (HCC)    TIA   Suprapubic discomfort 06/05/2015   TMJ (temporomandibular joint disorder)    USES  MOUTH GUARD   Toe fracture, left 12/2017   2nd toe prox phalanx  (immobilization, Dr. Luciana Axe)   Tubular adenoma of colon    Urinary frequency 08/01/2013   Vitamin D deficiency 05/2011   Weak urinary stream 07/2016   with elevated PVR and mild left hydronephrosis secondary to this ([facility] started flomax 0.4mg  qd for this).    Past Surgical History:  Procedure Laterality Date   ABIs  11/29/2019   NORMAL   ANTERIOR CERVICAL DECOMP/DISCECTOMY FUSION  06/30/2009   C3 -- C5  AND EXPLORATION OF FUSION C5-7 W/  PLATE REMOVAL   ARTHRODESIS METATARSALPHALANGEAL JOINT (MTPJ) Left 07/08/2021   Procedure: Left hallux metatarsophalangeal joint arthrodesis;  Surgeon: Toni Arthurs, MD;  Location: [facility];  Service: Orthopedics;  Laterality: Left;   BACK SURGERY  BUNIONECTOMY Left    CARDIOVASCULAR STRESS TEST  12/03/2019   myoc perf im: NORMAL   CERVICAL FUSION  2002   anterior C5 -- C7   CERVICAL SPINE SURGERY  08/27/1999     C6 -- T1  LAMINECTOMY/  DISKECTOMY   CESAREAN SECTION  1994   CHOLECYSTECTOMY N/A 03/06/2020   Procedure: LAPAROSCOPIC CHOLECYSTECTOMY;  Surgeon: Quentin Ore, MD;  Location: [facility];  Service: General;  Laterality: N/A;   COLONOSCOPY W/ POLYPECTOMY  04/2008;08/2014   2016 tubular adenoma x 1; +diverticulosis and int/ext hemorrhoids.  06/2020 adenoma, recall 5 yrs.   CYSTOSCOPY WITH URETEROSCOPY AND STENT PLACEMENT Left 08/28/2013   Procedure: CYSTOSCOPY, RGP,  WITH URETEROSCOPY AND STENT REMOVAL;  Surgeon: Valetta Fuller, MD;  Location: [facility];  Service: Urology;  Laterality: Left;   DEXA  09/2017   T-score -2.2 femur neck: improved compared to 06/2015.  2021 DEXA T score -2.3.  11/2021 DEXA T score -2.2.   ESOPHAGOGASTRODUODENOSCOPY  2010; 08/2014   2016 +Candidal esophagitis; Mild chronic gastritis w/intestinal metaplasia--NEG H pylori, neg for eosinophilic esoph. 06/2020 esoph candidiasis (diflucan x 20d), o/w normal.   HAMMER TOE SURGERY Left 07/08/2021   Procedure: Second metatarsal  head excision; revision Second hammertoe correction; 2-5 percutaneus flexor tenotomies;  Surgeon: Toni Arthurs, MD;  Location: [facility];  Service: Orthopedics;  Laterality: Left;   HARDWARE REMOVAL Left 07/08/2021   Procedure: Removal of deep implants from hallux proximal phalanx and First and Second metatarsals;  Surgeon: Toni Arthurs, MD;  Location: [facility];  Service: Orthopedics;  Laterality: Left;   HIP PINNING,CANNULATED Right 01/28/2023   Procedure: PERCUTANEOUS FIXATION OF RIGHT FEMORAL NECK;  Surgeon: Yolonda Kida, MD;  Location: [facility];  Service: Orthopedics;  Laterality: Right;   HOLMIUM LASER APPLICATION Left 08/28/2013   Procedure: HOLMIUM LASER APPLICATION;  Surgeon: Valetta Fuller, MD;  Location: [facility];  Service: Urology;  Laterality: Left;   LEFT HEART CATH AND CORONARY ANGIOGRAPHY N/A 05/15/2018   Evidence for spasm in distal LAD.  Mod RCA atherosclerosis, o/w no CAD.  Procedure: LEFT HEART CATH AND CORONARY ANGIOGRAPHY;  Surgeon: Marykay Lex, MD;  Location: [facility];  Service: Cardiovascular;  Laterality: N/A;   NEGATIVE SLEEP STUDY  04/01/2007   NEPHROLITHOTOMY Left 08/05/2013   Procedure: NEPHROLITHOTOMY PERCUTANEOUS;  Surgeon: Valetta Fuller, MD;  Location: [facility];  Service: Urology;  Laterality: Left;   POLYSOMNOGRAM  01/2019   NORMAL   POLYSOMNOGRAPHY  10/25/2018   Dr. Luetta Nutting due to pt inadequat sleep time (83 min).   TONSILLECTOMY AND ADENOIDECTOMY  1975   TOTAL KNEE ARTHROPLASTY Right 2003   TOTAL KNEE REVISION Right 01/19/2022   Procedure: Right knee polyethylene revision;  Surgeon: Ollen Gross, MD;  Location: [facility];  Service: Orthopedics;  Laterality: Right;   TRANSTHORACIC ECHOCARDIOGRAM  05/2018; 12/03/2019   (after MI secondary to arterial spasm): EF 45-50%; severe hypokinesis of apical anterolateral, inferolateral , anterior, and inferior LV myocardium. 11/2019 EF  55-60%, valves fine   ULTRASOUND EXAM,PELVIC COMPLETE ([facility] HX)  06/25/2015   NORMAL (done by GYN)    Outpatient Medications Prior to Visit  Medication Sig Dispense Refill   acetaminophen (TYLENOL) 500 MG tablet Take 500-1,000 mg by mouth daily as needed for mild pain or fever.     albuterol (VENTOLIN HFA) 108 (90 Base) MCG/ACT inhaler Inhale 2 puffs into the lungs every 6 (six) hours as needed for  wheezing or shortness of breath. 8 g 0   ALPRAZolam (XANAX) 0.5 MG tablet TAKE 1 TABLET BY MOUTH THREE TIMES A DAY AS NEEDED FOR ANXIETY 90 tablet 5   amoxicillin (AMOXIL) 500 MG capsule Take 2,000 mg by mouth daily as needed (1 hour prior to dental work).     aspirin EC 81 MG tablet Take 1 tablet (81 mg total) by mouth 2 (two) times daily. Swallow whole. 90 tablet 0   atorvastatin (LIPITOR) 80 MG tablet TAKE 1 TABLET BY MOUTH DAILY. (Patient taking differently: Take 80 mg by mouth every evening.) 90 tablet 3   buPROPion (WELLBUTRIN XL) 150 MG 24 hr tablet Take 150 mg by mouth daily.     citalopram (CELEXA) 20 MG tablet Take 1 tablet (20 mg total) by mouth in the morning and at bedtime.     famotidine (PEPCID) 40 MG tablet Take 1 tablet (40 mg total) by mouth daily. 90 tablet 3   fluticasone (FLONASE) 50 MCG/ACT nasal spray SPRAY 2 SPRAYS INTO EACH NOSTRIL EVERY DAY (Patient taking differently: Place 1 spray into both nostrils daily as needed for allergies.) 48 mL 1   hydrochlorothiazide (HYDRODIURIL) 12.5 MG tablet TAKE 1 TABLET BY MOUTH EVERY DAY 90 tablet 3   HYDROcodone-acetaminophen (NORCO) 7.5-325 MG tablet Take 1 tablet by mouth every 4 (four) hours as needed for moderate pain (pain score 4-6). 28 tablet 0   KLOR-CON M20 20 MEQ tablet TAKE 1 TABLET BY MOUTH EVERY DAY 90 tablet 1   lamoTRIgine (LAMICTAL) 100 MG tablet TAKE 3 TABLETS (300 MG TOTAL) BY MOUTH AT BEDTIME 270 tablet 2   loratadine (CLARITIN) 10 MG tablet Take 10 mg by mouth daily.     losartan (COZAAR) 25 MG tablet TAKE 1 TABLET BY  MOUTH EVERY DAY 90 tablet 3   meloxicam (MOBIC) 15 MG tablet TAKE 1 TABLET BY MOUTH ONCE DAILY AS NEEDED FOR JOINT PAIN 30 tablet 2   metoprolol succinate (TOPROL-XL) 100 MG 24 hr tablet TAKE 1 TABLET BY MOUTH EVERY DAY WITH OR IMMEDIATELY FOLLOWING A MEAL 90 tablet 3   montelukast (SINGULAIR) 10 MG tablet TAKE 1 TABLET BY MOUTH EVERY DAY IN THE EVENING 90 tablet 1   ondansetron (ZOFRAN) 4 MG tablet Take 1 tablet (4 mg total) by mouth every 8 (eight) hours as needed for nausea or vomiting. 20 tablet 0   pantoprazole (PROTONIX) 40 MG tablet TAKE 1 TABLET (40 MG TOTAL) BY MOUTH DAILY. KEEP F/U APPT FOR ANY FURTHER REFILLS 30 tablet 5   Tiotropium Bromide-Olodaterol (STIOLTO RESPIMAT) 2.5-2.5 MCG/ACT AERS INHALE 2 EACH INTO THE LUNGS DAILY. 4 g 11   zolpidem (AMBIEN) 10 MG tablet TAKE 1 TABLET BY MOUTH EVERYDAY AT BEDTIME 90 tablet 1   citalopram (CELEXA) 40 MG tablet Take 40 mg by mouth daily.     No facility-administered medications prior to visit.    Allergies  Allergen Reactions   Ceftin [Cefuroxime Axetil] Anaphylaxis and Swelling    Swelling of eyes and tongue   Cipro [Ciprofloxacin Hcl] Anaphylaxis and Swelling   Risperdal [Risperidone] Palpitations    Generalized weakness Malaise  Hypertension   Fosamax [Alendronate Sodium] Diarrhea   Linzess [Linaclotide] Diarrhea   Morphine And Codeine Other (See Comments)    Hallucinations Disorientation    Prednisone Other (See Comments)    Hyperactivity   Roxicodone [Oxycodone] Other (See Comments)    Previously addicted to medication   Seroquel [Quetiapine] Other (See Comments)    Oversedation  Review of Systems As per HPI  PE:    03/08/2023    1:06 PM 02/24/2023   11:08 AM 02/10/2023   11:16 AM  Vitals with BMI  Weight 169 lbs 6 oz 164 lbs 3 oz 170 lbs 3 oz  BMI   26.65  Systolic 115 125 540  Diastolic 72 80 69  Pulse 72 62 64     Physical Exam  Gen: Alert, well appearing.  Patient is oriented to person, place,  time, and situation. AFFECT: pleasant, lucid thought and speech. No further exam today  LABS:  Last CBC Lab Results  Component Value Date   WBC 10.9 (H) 02/10/2023   HGB 9.3 (L) 02/10/2023   HCT 27.7 (L) 02/10/2023   MCV 91.1 02/10/2023   MCH 30.6 02/10/2023   RDW 12.9 02/10/2023   PLT 329 02/10/2023   Last metabolic panel Lab Results  Component Value Date   GLUCOSE 94 02/01/2023   NA 137 02/01/2023   K 4.2 02/01/2023   CL 104 02/01/2023   CO2 26 02/01/2023   BUN 16 02/01/2023   CREATININE 0.87 02/01/2023   GFRNONAA >60 02/01/2023   CALCIUM 8.8 (L) 02/01/2023   PROT 5.6 (L) 01/30/2023   ALBUMIN 2.8 (L) 01/30/2023   BILITOT 0.5 01/30/2023   ALKPHOS 83 01/30/2023   AST 64 (H) 01/30/2023   ALT 38 01/30/2023   ANIONGAP 7 02/01/2023   Last lipids Lab Results  Component Value Date   CHOL 176 01/19/2023   HDL 70.60 01/19/2023   LDLCALC 90 01/19/2023   LDLDIRECT 144.1 05/29/2013   TRIG 76.0 01/19/2023   CHOLHDL 2 01/19/2023   Last hemoglobin A1c Lab Results  Component Value Date   HGBA1C 5.5 01/06/2022   Last thyroid functions Lab Results  Component Value Date   TSH 0.91 10/12/2022   Last vitamin B12 and Folate Lab Results  Component Value Date   VITAMINB12 933 02/10/2023   IMPRESSION AND PLAN:  1 Word finding difficulty, short-term memory impairment. This is mild but bothers her significantly. Ordered referral to neuropsychology for neuropsychiatric testing. Patient states she never got called. Will follow-up on this referral.  #2 bipolar disorder, GAD. Continue Wellbutrin XL 300 mg a day, citalopram 20 mg bid, and Lamictal 300 mg a day.  Also continue alprazolam 0.5 mg 3 times daily as needed. Continue Ambien 10 mg nightly for insomnia.   3.  Acute bronchopneumonia: Resolved.   4.  Hypertension, well-controlled on Toprol-XL 100 mg a day, losartan 25 mg a day, and HCTZ 12.5 mg a day. Electrolytes and creatinine normal on 02/01/2023.   #5  hypercholesterolemia, doing well on a atorvastatin 80 mg a day. Last LDL was 90 about 6 weeks ago. Plan repeat lipids in 6 months.  #6 right hip fracture --> she is status post ORIF 01/28/2023. Progressing appropriately with PT. She has follow-up with the orthopedist on the 16th.  An After Visit Summary was printed and given to the patient.  FOLLOW UP: No follow-ups on file. Next CPE 2 months Signed:  Santiago Bumpers, MD           03/08/2023

## 2023-03-14 DIAGNOSIS — Z4789 Encounter for other orthopedic aftercare: Secondary | ICD-10-CM | POA: Diagnosis not present

## 2023-03-14 DIAGNOSIS — M542 Cervicalgia: Secondary | ICD-10-CM | POA: Diagnosis not present

## 2023-03-22 NOTE — Telephone Encounter (Signed)
Referral moved to Dr. Idamae Lusher letter sent to patient via mychart

## 2023-04-10 ENCOUNTER — Other Ambulatory Visit: Payer: Self-pay | Admitting: Pulmonary Disease

## 2023-04-10 ENCOUNTER — Other Ambulatory Visit: Payer: Self-pay | Admitting: Internal Medicine

## 2023-04-10 ENCOUNTER — Other Ambulatory Visit: Payer: Self-pay | Admitting: Family Medicine

## 2023-04-12 ENCOUNTER — Telehealth: Payer: Self-pay | Admitting: Pulmonary Disease

## 2023-04-12 NOTE — Telephone Encounter (Signed)
 Needs [facility] is [facility] on [address].   She is out. Adv PT to give [place] 24-48 hrs for refills.   Her # is [phone]

## 2023-04-14 ENCOUNTER — Other Ambulatory Visit: Payer: Self-pay | Admitting: Pulmonary Disease

## 2023-04-14 MED ORDER — STIOLTO RESPIMAT 2.5-2.5 MCG/ACT IN AERS: 2.0000 | INHALATION_SPRAY | Freq: Every day | RESPIRATORY_TRACT | 0 refills | Status: DC

## 2023-04-14 NOTE — Telephone Encounter (Signed)
 [facility] refilled.

## 2023-04-15 NOTE — Telephone Encounter (Signed)
Dr. Mannam, please advise on this. 

## 2023-04-24 ENCOUNTER — Other Ambulatory Visit: Payer: Self-pay | Admitting: Family Medicine

## 2023-05-11 NOTE — Patient Instructions (Signed)
   It was very nice to see you today!   PLEASE NOTE:  If labs were collected or images ordered, we will inform you of  results once we have received them and reviewed. We will contact you either by echart message, or telephone call.     Please give ample time to the testing facility, and our office to run, receive and review results. Please do not call inquiring of results, even if you can see them in your chart. We will contact you as soon as we are able. If it has been over 1 week since the test was completed, and you have not yet heard from [place], then please call us.   If we ordered any referrals today, please let us know if you have not heard from their office within the next 2 weeks. You should receive a letter via MyChart confirming if the referral was approved and their office contact information to schedule.   Please try these tips to maintain a healthy lifestyle:  Eat most of your calories during the day when you are active. Eliminate processed foods including packaged sweets (pies, cakes, cookies), reduce intake of potatoes, white bread, white pasta, and white rice. Look for whole grain options, oat flour or almond flour.  Each meal should contain half fruits/vegetables, one quarter protein, and one quarter carbs (no bigger than a computer mouse).  Cut down on sweet beverages. This includes juice, soda, and sweet tea. Also watch fruit intake, though this is a healthier sweet option, it still contains natural sugar! Limit to 3 servings daily.  Drink at least 1 glass of water with each meal and aim for at least 8 glasses per day  Exercise at least 150 minutes every week.

## 2023-05-12 ENCOUNTER — Encounter: Payer: Self-pay | Admitting: Family Medicine

## 2023-05-12 ENCOUNTER — Ambulatory Visit: Payer: Medicare HMO | Admitting: Family Medicine

## 2023-05-12 VITALS — BP 144/80 | HR 64 | Ht 67.0 in | Wt 173.4 lb

## 2023-05-12 DIAGNOSIS — Z1231 Encounter for screening mammogram for malignant neoplasm of breast: Secondary | ICD-10-CM

## 2023-05-12 DIAGNOSIS — I10 Essential (primary) hypertension: Secondary | ICD-10-CM | POA: Diagnosis not present

## 2023-05-12 DIAGNOSIS — D649 Anemia, unspecified: Secondary | ICD-10-CM | POA: Diagnosis not present

## 2023-05-12 DIAGNOSIS — M542 Cervicalgia: Secondary | ICD-10-CM

## 2023-05-12 DIAGNOSIS — Z0000 Encounter for general adult medical examination without abnormal findings: Secondary | ICD-10-CM

## 2023-05-12 DIAGNOSIS — Z9889 Other specified postprocedural states: Secondary | ICD-10-CM

## 2023-05-12 DIAGNOSIS — G8928 Other chronic postprocedural pain: Secondary | ICD-10-CM

## 2023-05-12 DIAGNOSIS — Z79899 Other long term (current) drug therapy: Secondary | ICD-10-CM

## 2023-05-12 DIAGNOSIS — F319 Bipolar disorder, unspecified: Secondary | ICD-10-CM | POA: Diagnosis not present

## 2023-05-12 DIAGNOSIS — F411 Generalized anxiety disorder: Secondary | ICD-10-CM | POA: Diagnosis not present

## 2023-05-12 DIAGNOSIS — Z8781 Personal history of (healed) traumatic fracture: Secondary | ICD-10-CM | POA: Diagnosis not present

## 2023-05-12 DIAGNOSIS — M25551 Pain in right hip: Secondary | ICD-10-CM

## 2023-05-12 LAB — COMPREHENSIVE METABOLIC PANEL
ALT: 28 U/L (ref 0–35)
AST: 20 U/L (ref 0–37)
Albumin: 4.5 g/dL (ref 3.5–5.2)
Alkaline Phosphatase: 66 U/L (ref 39–117)
BUN: 13 mg/dL (ref 6–23)
CO2: 30 meq/L (ref 19–32)
Calcium: 9.4 mg/dL (ref 8.4–10.5)
Chloride: 94 meq/L — ABNORMAL LOW (ref 96–112)
Creatinine, Ser: 1.06 mg/dL (ref 0.40–1.20)
GFR: 53.28 mL/min — ABNORMAL LOW (ref >60.00)
Glucose, Bld: 81 mg/dL (ref 70–99)
Potassium: 4.1 meq/L (ref 3.5–5.1)
Sodium: 132 meq/L — ABNORMAL LOW (ref 135–145)
Total Bilirubin: 0.8 mg/dL (ref 0.2–1.2)
Total Protein: 6.3 g/dL (ref 6.0–8.3)

## 2023-05-12 LAB — CBC
HCT: 34 % — ABNORMAL LOW (ref 36.0–46.0)
Hemoglobin: 11.6 g/dL — ABNORMAL LOW (ref 12.0–15.0)
MCHC: 34.2 g/dL (ref 30.0–36.0)
MCV: 90.7 fL (ref 78.0–100.0)
Platelets: 262 10*3/uL (ref 150.0–400.0)
RBC: 3.75 Mil/uL — ABNORMAL LOW (ref 3.87–5.11)
RDW: 13.2 % (ref 11.5–15.5)
WBC: 8.6 10*3/uL (ref 4.0–10.5)

## 2023-05-12 MED ORDER — LOSARTAN POTASSIUM 50 MG PO TABS: 50.0000 mg | ORAL_TABLET | Freq: Every day | ORAL | 0 refills | Status: DC

## 2023-05-12 MED ORDER — ALPRAZOLAM 0.5 MG PO TABS: ORAL_TABLET | ORAL | 5 refills | Status: DC
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
## 2023-05-14 ENCOUNTER — Other Ambulatory Visit: Payer: Self-pay | Admitting: Family Medicine

## 2023-05-14 ENCOUNTER — Other Ambulatory Visit: Payer: Self-pay | Admitting: Pulmonary Disease

## 2023-05-14 LAB — DRUG MONITORING PANEL 376104, URINE

## 2023-05-14 LAB — DM TEMPLATE

## 2023-05-15 ENCOUNTER — Encounter: Payer: Self-pay | Admitting: Family Medicine

## 2023-05-17 ENCOUNTER — Other Ambulatory Visit: Payer: Self-pay | Admitting: Pulmonary Disease

## 2023-05-26 ENCOUNTER — Ambulatory Visit: Payer: Medicare HMO | Admitting: Family Medicine

## 2023-05-28 ENCOUNTER — Emergency Department (HOSPITAL_COMMUNITY)
Admission: EM | Admit: 2023-05-28 | Discharge: 2023-05-29 | Disposition: A | Discharge status: Home / Self Care | Payer: Medicare HMO | Attending: Emergency Medicine | Admitting: Emergency Medicine

## 2023-05-28 ENCOUNTER — Other Ambulatory Visit: Payer: Self-pay

## 2023-05-28 ENCOUNTER — Encounter (HOSPITAL_COMMUNITY): Payer: Self-pay | Admitting: *Deleted

## 2023-05-28 DIAGNOSIS — Z79899 Other long term (current) drug therapy: Secondary | ICD-10-CM | POA: Insufficient documentation

## 2023-05-28 DIAGNOSIS — M542 Cervicalgia: Secondary | ICD-10-CM | POA: Diagnosis not present

## 2023-05-28 DIAGNOSIS — H53149 Visual discomfort, unspecified: Secondary | ICD-10-CM | POA: Diagnosis not present

## 2023-05-28 DIAGNOSIS — D649 Anemia, unspecified: Secondary | ICD-10-CM | POA: Diagnosis not present

## 2023-05-28 DIAGNOSIS — R030 Elevated blood-pressure reading, without diagnosis of hypertension: Secondary | ICD-10-CM

## 2023-05-28 DIAGNOSIS — R519 Headache, unspecified: Secondary | ICD-10-CM

## 2023-05-28 DIAGNOSIS — I6523 Occlusion and stenosis of bilateral carotid arteries: Secondary | ICD-10-CM | POA: Diagnosis not present

## 2023-05-28 DIAGNOSIS — I10 Essential (primary) hypertension: Secondary | ICD-10-CM | POA: Insufficient documentation

## 2023-05-28 DIAGNOSIS — R29818 Other symptoms and signs involving the nervous system: Secondary | ICD-10-CM | POA: Diagnosis not present

## 2023-05-28 LAB — CBC WITH DIFFERENTIAL/PLATELET
Abs Immature Granulocytes: 0.02 10*3/uL (ref 0.00–0.07)
Basophils Absolute: 0.1 10*3/uL (ref 0.0–0.1)
Basophils Relative: 1 %
Eosinophils Absolute: 0.6 10*3/uL — ABNORMAL HIGH (ref 0.0–0.5)
Eosinophils Relative: 8 %
HCT: 32.3 % — ABNORMAL LOW (ref 36.0–46.0)
Hemoglobin: 11.2 g/dL — ABNORMAL LOW (ref 12.0–15.0)
Immature Granulocytes: 0 %
Lymphocytes Relative: 25 %
Lymphs Abs: 1.9 10*3/uL (ref 0.7–4.0)
MCH: 31 pg (ref 26.0–34.0)
MCHC: 34.7 g/dL (ref 30.0–36.0)
MCV: 89.5 fL (ref 80.0–100.0)
Monocytes Absolute: 0.6 10*3/uL (ref 0.1–1.0)
Monocytes Relative: 8 %
Neutro Abs: 4.3 10*3/uL (ref 1.7–7.7)
Neutrophils Relative %: 58 %
Platelets: 248 10*3/uL (ref 150–400)
RBC: 3.61 MIL/uL — ABNORMAL LOW (ref 3.87–5.11)
RDW: 12.2 % (ref 11.5–15.5)
WBC: 7.4 10*3/uL (ref 4.0–10.5)
nRBC: 0 % (ref 0.0–0.2)

## 2023-05-28 LAB — BASIC METABOLIC PANEL
Anion gap: 12 (ref 5–15)
BUN: 15 mg/dL (ref 8–23)
CO2: 23 mmol/L (ref 22–32)
Calcium: 9.4 mg/dL (ref 8.9–10.3)
Chloride: 99 mmol/L (ref 98–111)
Creatinine, Ser: 1.03 mg/dL — ABNORMAL HIGH (ref 0.44–1.00)
GFR, Estimated: 58 mL/min — ABNORMAL LOW (ref >60)
Glucose, Bld: 92 mg/dL (ref 70–99)
Potassium: 3.8 mmol/L (ref 3.5–5.1)
Sodium: 134 mmol/L — ABNORMAL LOW (ref 135–145)

## 2023-05-28 NOTE — ED Provider Triage Note (Signed)
 Emergency Medicine Provider Triage Evaluation Note  CHANTIL BARI , a 73 y.o. female  was evaluated in triage.  Pt complains of headache.  Review of Systems  Positive:  Negative:   Physical Exam  BP (!) 169/81   Pulse 71   Temp 98.1 F (36.7 C) (Oral)   Resp 18   Ht 5\' 7"  (1.702 m)   Wt 78.7 kg   SpO2 95%   BMI 27.17 kg/m  Gen:   Awake, no distress   Resp:  Normal effort  MSK:   Moves extremities without difficulty  Other:    Medical Decision Making  Medically screening exam initiated at 8:19 PM.  Appropriate orders placed.  DARICA GOREN was informed that the remainder of the evaluation will be completed by another provider, this initial triage assessment does not replace that evaluation, and the importance of remaining in the ED until their evaluation is complete.  Headache starting this morning. Did not take any medicines. Headache started in BL temples and radiated down both arms.  No nausea, vomiting, diarrhea, fever, congestion, rhinorrhea, cough.   Dorthy Cooler, [place] 05/28/23 2021

## 2023-05-28 NOTE — ED Triage Notes (Signed)
 Headache since this afternoon she did not take any meds for the pain she was hoping that it would go away  bp has been high all week

## 2023-05-29 ENCOUNTER — Ambulatory Visit: Payer: Medicare HMO | Admitting: Primary Care

## 2023-05-29 ENCOUNTER — Emergency Department (HOSPITAL_COMMUNITY): Payer: Medicare HMO

## 2023-05-29 DIAGNOSIS — I6523 Occlusion and stenosis of bilateral carotid arteries: Secondary | ICD-10-CM | POA: Diagnosis not present

## 2023-05-29 DIAGNOSIS — R519 Headache, unspecified: Secondary | ICD-10-CM | POA: Diagnosis not present

## 2023-05-29 DIAGNOSIS — I10 Essential (primary) hypertension: Secondary | ICD-10-CM | POA: Diagnosis not present

## 2023-05-29 DIAGNOSIS — R29818 Other symptoms and signs involving the nervous system: Secondary | ICD-10-CM | POA: Diagnosis not present

## 2023-05-29 MED ORDER — KETOROLAC TROMETHAMINE 30 MG/ML IJ SOLN
15.0000 mg | Freq: Once | INTRAMUSCULAR | Status: AC
  Administered 2023-05-29: 15 mg via INTRAVENOUS
  Filled 2023-05-29: qty 1

## 2023-05-29 MED ORDER — IOHEXOL 350 MG/ML SOLN
75.0000 mL | Freq: Once | INTRAVENOUS | Status: AC | PRN
  Administered 2023-05-29: 75 mL via INTRAVENOUS

## 2023-05-29 MED ORDER — DIAZEPAM 5 MG/ML IJ SOLN
2.5000 mg | Freq: Once | INTRAMUSCULAR | Status: AC
  Administered 2023-05-29: 2.5 mg via INTRAVENOUS
  Filled 2023-05-29: qty 2

## 2023-05-29 MED ORDER — SODIUM CHLORIDE 0.9 % IV BOLUS
1000.0000 mL | Freq: Once | INTRAVENOUS | Status: AC
  Administered 2023-05-29: 1000 mL via INTRAVENOUS

## 2023-05-29 MED ORDER — PROCHLORPERAZINE EDISYLATE 10 MG/2ML IJ SOLN
10.0000 mg | Freq: Once | INTRAMUSCULAR | Status: AC
  Administered 2023-05-29: 10 mg via INTRAVENOUS
  Filled 2023-05-29: qty 2

## 2023-05-29 NOTE — ED Notes (Signed)
 Patient transported to CT

## 2023-05-29 NOTE — Discharge Instructions (Addendum)
 Your blood sugar is stable, chronic kidney insufficiency and mild anemia.  This is your baseline lab result and there are no changes from previous visits. You should follow closely with your primary care physician regarding blood pressure management and headaches. You are having a headache. No specific cause was found today for your headache. It may have been a migraine or other cause of headache. Stress, anxiety, fatigue, and depression are common triggers for headaches. Your headache today does not appear to be life-threatening or require hospitalization, but often the exact cause of headaches is not determined in the emergency department. Therefore, follow-up with your doctor is very important to find out what may have caused your headache, and whether or not you need any further diagnostic testing or treatment. Sometimes headaches can appear benign (not harmful), but then more serious symptoms can develop which should prompt an immediate re-evaluation by your doctor or the emergency department. SEEK MEDICAL ATTENTION IF: You develop possible problems with medications prescribed.  The medications don't resolve your headache, if it recurs , or if you have multiple episodes of vomiting or can't take fluids. You have a change from the usual headache. RETURN IMMEDIATELY IF you develop a sudden, severe headache or confusion, become poorly responsive or faint, develop a fever above 100.8F or problem breathing, have a change in speech, vision, swallowing, or understanding, or develop new weakness, numbness, tingling, incoordination, or have a seizure.

## 2023-05-29 NOTE — ED Provider Notes (Signed)
 [place] EMERGENCY DEPARTMENT AT [facility] Provider Note   CSN: [id number] Arrival date & time: 05/28/23  1918     History  Chief Complaint  Patient presents with   Hypertension   Headache    Erin Lopez is a 73 y.o. female.  The history is provided by the patient. No language interpreter was used.  Headache Pain location:  Frontal Quality:  Dull Radiates to:  L neck and R neck Severity currently:  8/10 Severity at highest:  8/10 Onset quality:  Gradual Duration:  1 day Timing:  Constant Progression:  Worsening Chronicity:  Recurrent Similar to prior headaches: yes   Context: not activity, not caffeine, not coughing, not defecating, not eating, not stress, not exposure to cold air, not intercourse, not loud noise and not straining   Relieved by:  None tried Worsened by:  Nothing Ineffective treatments:  None tried Associated symptoms: neck pain and photophobia   Associated symptoms: no abdominal pain, no back pain, no blurred vision, no congestion, no cough, no diarrhea, no dizziness, no drainage, no ear pain, no eye pain, no facial pain, no fatigue, no fever, no focal weakness, no hearing loss, no loss of balance, no myalgias, no nausea, no near-syncope, no neck stiffness, no numbness, no paresthesias, no seizures, no sinus pressure, no sore throat, no swollen glands, no syncope, no tingling, no URI, no visual change, no vomiting and no weakness        Home Medications Prior to Admission medications   Medication Sig Start Date End Date Taking? Authorizing Provider  acetaminophen (TYLENOL) 500 MG tablet Take 500-1,000 mg by mouth daily as needed for mild pain or fever.    [provider]  albuterol (VENTOLIN HFA) 108 (90 Base) MCG/ACT inhaler TAKE 2 PUFFS BY MOUTH EVERY 6 HOURS AS NEEDED FOR WHEEZE OR SHORTNESS OF BREATH 05/15/23   McGowen, Maryjean Morn, MD  ALPRAZolam Prudy Feeler) 0.5 MG tablet TAKE 1 TABLET BY MOUTH THREE TIMES A DAY AS NEEDED FOR  ANXIETY 05/12/23   McGowen, Maryjean Morn, MD  amoxicillin (AMOXIL) 500 MG capsule Take 2,000 mg by mouth daily as needed (1 hour prior to dental work).    [provider]  atorvastatin (LIPITOR) 80 MG tablet TAKE 1 TABLET BY MOUTH DAILY. Patient taking differently: Take 80 mg by mouth every evening. 10/12/22   McGowen, Maryjean Morn, MD  buPROPion (WELLBUTRIN XL) 150 MG 24 hr tablet Take 150 mg by mouth daily.    [provider]  citalopram (CELEXA) 20 MG tablet Take 1 tablet (20 mg total) by mouth in the morning and at bedtime. 12/15/22   McGowen, Maryjean Morn, MD  famotidine (PEPCID) 40 MG tablet Take 1 tablet (40 mg total) by mouth daily. 10/12/22   McGowen, Maryjean Morn, MD  fluticasone (FLONASE) 50 MCG/ACT nasal spray SPRAY 2 SPRAYS INTO EACH NOSTRIL EVERY DAY 05/15/23   McGowen, Maryjean Morn, MD  hydrochlorothiazide (HYDRODIURIL) 12.5 MG tablet TAKE 1 TABLET BY MOUTH EVERY DAY 12/06/22   Quintella Reichert, MD  KLOR-CON M20 20 MEQ tablet TAKE 1 TABLET BY MOUTH EVERY DAY 01/23/23   McGowen, Maryjean Morn, MD  lamoTRIgine (LAMICTAL) 100 MG tablet TAKE 3 TABLETS (300 MG TOTAL) BY MOUTH AT BEDTIME 10/20/22   McGowen, Maryjean Morn, MD  loratadine (CLARITIN) 10 MG tablet Take 10 mg by mouth daily.    [provider]  losartan (COZAAR) 50 MG tablet TAKE 1 TABLET BY MOUTH EVERY DAY 05/15/23   McGowen, Maryjean Morn,  MD  meloxicam (MOBIC) 15 MG tablet TAKE 1 TABLET BY MOUTH ONCE DAILY AS NEEDED FOR JOINT PAIN 05/15/23   McGowen, Maryjean Morn, MD  metoprolol succinate (TOPROL-XL) 100 MG 24 hr tablet TAKE 1 TABLET BY MOUTH EVERY DAY WITH OR IMMEDIATELY FOLLOWING A MEAL 02/01/23   Turner, Cornelious Bryant, MD  montelukast (SINGULAIR) 10 MG tablet TAKE 1 TABLET BY MOUTH EVERY DAY IN THE EVENING 05/15/23   McGowen, Maryjean Morn, MD  ondansetron (ZOFRAN) 4 MG tablet Take 1 tablet (4 mg total) by mouth every 8 (eight) hours as needed for nausea or vomiting. 01/28/23   Yolonda Kida, MD  pantoprazole (PROTONIX) 40 MG tablet TAKE 1 TABLET (40  MG TOTAL) BY MOUTH DAILY. KEEP F/U APPT FOR ANY FURTHER REFILLS 04/10/23   Pyrtle, Carie Caddy, MD  Tiotropium Bromide-Olodaterol (STIOLTO RESPIMAT) 2.5-2.5 MCG/ACT AERS INHALE 2 EACH INTO THE LUNGS DAILY. 05/17/23   Mannam, Colbert Coyer, MD  umeclidinium-vilanterol (ANORO ELLIPTA) 62.5-25 MCG/ACT AEPB Inhale 1 puff into the lungs daily. 04/24/23 04/23/24  Chilton Greathouse, MD  zolpidem (AMBIEN) 10 MG tablet TAKE 1 TABLET BY MOUTH EVERYDAY AT BEDTIME 10/25/22   McGowen, Maryjean Morn, MD      Allergies    Ceftin [cefuroxime axetil], Cipro [ciprofloxacin hcl], Risperdal [risperidone], Fosamax [alendronate sodium], Linzess [linaclotide], Morphine and codeine, Prednisone, Roxicodone [oxycodone], and Seroquel [quetiapine]    Review of Systems   Review of Systems  Constitutional:  Negative for fatigue and fever.  HENT:  Negative for congestion, ear pain, hearing loss, postnasal drip, sinus pressure and sore throat.   Eyes:  Positive for photophobia. Negative for blurred vision and pain.  Respiratory:  Negative for cough.   Cardiovascular:  Negative for syncope and near-syncope.  Gastrointestinal:  Negative for abdominal pain, diarrhea, nausea and vomiting.  Musculoskeletal:  Positive for neck pain. Negative for back pain, myalgias and neck stiffness.  Neurological:  Positive for headaches. Negative for dizziness, focal weakness, seizures, weakness, numbness, paresthesias and loss of balance.    Physical Exam Updated Vital Signs BP (!) 156/68   Pulse 65   Temp 98.1 F (36.7 C) (Oral)   Resp 18   Ht 5\' 7"  (1.702 m)   Wt 78.7 kg   SpO2 99%   BMI 27.17 kg/m  Physical Exam Physical Exam  Constitutional: Pt is oriented to person, place, and time. Pt appears well-developed and well-nourished. No distress.  HENT:  Head: Normocephalic and atraumatic.  Mouth/Throat: Oropharynx is clear and moist.  Eyes: Conjunctivae and EOM are normal. Pupils are equal, round, and reactive to light. No scleral icterus.  No  horizontal, vertical or rotational nystagmus  Neck: Normal range of motion. Neck supple.  Full active and passive ROM without pain No midline or paraspinal tenderness No nuchal rigidity or meningeal signs  Cardiovascular: Normal rate, regular rhythm and intact distal pulses.   Pulmonary/Chest: Effort normal and breath sounds normal. No respiratory distress. Pt has no wheezes. No rales.  Abdominal: Soft. Bowel sounds are normal. There is no tenderness. There is no rebound and no guarding.  Musculoskeletal: Normal range of motion.  Lymphadenopathy:    No cervical adenopathy.  Neurological: Pt. is alert and oriented to person, place, and time. He has normal reflexes. No cranial nerve deficit.  Exhibits normal muscle tone. Coordination normal.  Mental Status:  Alert, oriented, thought content appropriate. Speech fluent without evidence of aphasia. Able to follow 2 step commands without difficulty.  Cranial Nerves:  II:  Peripheral visual fields grossly normal, pupils  equal, round, reactive to light III,IV, VI: ptosis not present, extra-ocular motions intact bilaterally  V,VII: smile symmetric, facial light touch sensation equal VIII: hearing grossly normal bilaterally  IX,X: midline uvula rise  XI: bilateral shoulder shrug equal and strong XII: midline tongue extension  Motor:  5/5 in upper and lower extremities bilaterally including strong and equal grip strength and dorsiflexion/plantar flexion Sensory: Pinprick and light touch normal in all extremities.  Deep Tendon Reflexes: 2+ and symmetric  Cerebellar: normal finger-to-nose with bilateral upper extremities Gait: normal gait and balance CV: distal pulses palpable throughout   Skin: Skin is warm and dry. No rash noted. Pt is not diaphoretic.  Psychiatric: Pt has a normal mood and affect. Behavior is normal. Judgment and thought content normal.  Nursing note and vitals reviewed.  ED Results / Procedures / Treatments   Labs (all labs  ordered are listed, but only abnormal results are displayed) Labs Reviewed  CBC WITH DIFFERENTIAL/PLATELET - Abnormal; Notable for the following components:      Result Value   RBC 3.61 (*)    Hemoglobin 11.2 (*)    HCT 32.3 (*)    Eosinophils Absolute 0.6 (*)    All other components within normal limits  BASIC METABOLIC PANEL - Abnormal; Notable for the following components:   Sodium 134 (*)    Creatinine, Ser 1.03 (*)    GFR, Estimated 58 (*)    All other components within normal limits    EKG None  Radiology No results found.  Procedures Procedures    Medications Ordered in ED Medications  sodium chloride 0.9 % bolus 1,000 mL (1,000 mLs Intravenous New Bag/Given 05/29/23 0128)  diazepam (VALIUM) injection 2.5 mg (2.5 mg Intravenous Given 05/29/23 0126)  prochlorperazine (COMPAZINE) injection 10 mg (10 mg Intravenous Given 05/29/23 0127)  ketorolac (TORADOL) 30 MG/ML injection 15 mg (15 mg Intravenous Given 05/29/23 0127)    ED Course/ Medical Decision Making/ A&P Clinical Course as of 05/29/23 0321  Mon May 29, 2023  0207 CT ANGIO HEAD NECK W WO CM I personally visualized and interpreted the images using our PACS system. Acute findings include:  None on CTA H/N   [AH]  0208 Hemoglobin(!): 11.2 Chronic anemia  [AH]  0208 Creatinine(!): 1.03 [AH]  0208 GFR, Estimated(!): 58 Renal fx at baseline  [AH]  0321 Patient reevaluated. Headache is resolved.  [AH]    Clinical Course User Index [AH] Arthor Captain, PA-C                                 Medical Decision Making ETHYL VILA presents with headache Given the large differential diagnosis for TEKESHIA KLAHR, the decision making in this case is of high complexity.  After evaluating all of the data points in this case, the presentation of MIKEYA TOMASETTI is NOT consistent with skull fracture, meningitis/encephalitis, SAH/sentinel bleed, Intracranial Hemorrhage (ICH) (subdural/epidural), acute  obstructive hydrocephalus, space occupying lesions, CVA, CO Poisoning, Basilar/vertebral artery dissection, preeclampsia, cerebral venous thrombosis, hypertensive emergency, temporal Arteritis, Idiopathic Intracranial Hypertension (pseudotumor cerebri).  Strict return and follow-up precautions have been given by me personally or by detailed written instructions verbalized by nursing staff using the teach back method to patient/family/caregiver.  Data Reviewed/Counseling: I have reviewed the patient's vital signs, nursing notes, and other relevant tests/information. I had a detailed discussion regarding the historical points, exam findings, and any diagnostic results supporting the discharge diagnosis. I also  discussed the need for outpatient follow-up and the need to return to the ED if symptoms worsen or if there are any questions or concerns that arise at hom    Amount and/or Complexity of Data Reviewed Labs: ordered. Decision-making details documented in ED Course. Radiology: ordered and independent interpretation performed. Decision-making details documented in ED Course.  Risk Prescription drug management.           Final Clinical Impression(s) / ED Diagnoses Final diagnoses:  Bad headache  Elevated blood pressure reading    Rx / DC Orders ED Discharge Orders     None         Arthor Captain, PA-C 05/29/23 1610    Glynn Octave, MD 05/29/23 1047

## 2023-05-30 ENCOUNTER — Encounter (HOSPITAL_BASED_OUTPATIENT_CLINIC_OR_DEPARTMENT_OTHER): Payer: Self-pay | Admitting: Radiology

## 2023-05-30 ENCOUNTER — Ambulatory Visit (HOSPITAL_BASED_OUTPATIENT_CLINIC_OR_DEPARTMENT_OTHER)
Admission: RE | Admit: 2023-05-30 | Discharge: 2023-05-30 | Disposition: A | Discharge status: Home / Self Care | Payer: Medicare HMO | Source: Ambulatory Visit | Attending: Family Medicine | Admitting: Family Medicine

## 2023-05-30 DIAGNOSIS — Z1231 Encounter for screening mammogram for malignant neoplasm of breast: Secondary | ICD-10-CM | POA: Diagnosis not present

## 2023-06-02 ENCOUNTER — Ambulatory Visit: Payer: Medicare HMO | Admitting: Family Medicine

## 2023-06-02 ENCOUNTER — Encounter: Payer: Self-pay | Admitting: Family Medicine

## 2023-06-02 VITALS — BP 138/72 | HR 69 | Ht 67.0 in | Wt 175.0 lb

## 2023-06-02 DIAGNOSIS — M542 Cervicalgia: Secondary | ICD-10-CM

## 2023-06-02 DIAGNOSIS — G8929 Other chronic pain: Secondary | ICD-10-CM

## 2023-06-02 DIAGNOSIS — M5412 Radiculopathy, cervical region: Secondary | ICD-10-CM

## 2023-06-02 DIAGNOSIS — G894 Chronic pain syndrome: Secondary | ICD-10-CM | POA: Diagnosis not present

## 2023-06-02 DIAGNOSIS — I10 Essential (primary) hypertension: Secondary | ICD-10-CM

## 2023-06-02 MED ORDER — HYDROCODONE-ACETAMINOPHEN 5-325 MG PO TABS: 1.0000 | ORAL_TABLET | Freq: Four times a day (QID) | ORAL | 0 refills | Status: DC | PRN

## 2023-06-02 MED ORDER — HYDROCODONE-ACETAMINOPHEN 5-325 MG PO TABS: ORAL_TABLET | ORAL | 0 refills | Status: DC

## 2023-06-02 MED ORDER — HYDROCHLOROTHIAZIDE 12.5 MG PO TABS: ORAL_TABLET | ORAL | Status: DC

## 2023-06-02 NOTE — Patient Instructions (Signed)
 Take 2 hydrochlorothiazide daily.  If bp is still >140 on top or > 90 on bottom then also take 1 and 1/2 metoprolol daily.  Keep losartan dose 50mg  once a day.

## 2023-06-02 NOTE — Progress Notes (Signed)
 OFFICE VISIT  06/02/2023  CC:  Chief Complaint  Patient presents with   Medical Management of Chronic Issues    2 wk f/u HTN    Patient is a 73 y.o. female who presents for 3-week follow-up hypertension. A/P as of last visit: " Hypertension, poor control lately. Could be due to her increased pain. Increase losartan to 50 mg a day, continue HCTZ 12.5 mg a day, and continue Toprol-XL 100 mg a day. Electrolytes and creatinine monitoring today."  INTERIM HX: She went to ED the other day b/c hurting worse in back of neck and some HA, says she knew it was d/t HTN. Felt better after they gave valium in ED. CT angio head and neck showed no emergent large vessel occlusion or hemodynamically significant stenosis of the head or neck.  It did note chronic small vessel disease.  Chronic intense neck pain rad over L trap/supraspinatus, top of shoulder. Taking hydrocodone once a day prn hip or neck pain, somewhat helpful. BP consistently 160/90s avg.  PMP AWARE reviewed today: most recent rx for norco 5/325 was filled 04/18/23, # 30, rx by Dr. Duwayne Heck. No red flags.  ROS: no focal weakness.  No visual abnormalities.  No chest pain or shortness of breath.  No lower extremity swelling. Currently no headache.   Past Medical History:  Diagnosis Date   Acute encephalopathy 11/19/2017   Alcoholism in remission [facility])    states last alcohol 11 yrs ago   Anemia    Anxiety    Bipolar 1 disorder (HCC)    Admission for mania x 2 (most recent 11/2017)   Cervical spondylosis without myelopathy 12/19/2013   MRI 02/2014: multilevel DDD/spondylosis, not much change compared to prior MRI.  Pt not in favor of invasive therapy for her neck as of 04/2014.   Chronic renal insufficiency, stage 3 (moderate) (HCC)    COPD (chronic obstructive pulmonary disease) (HCC)    Moderate: anoro started 04/2017 by pulm, pt symptomatically improved and PFTs stable at f/u 06/2017.   Coronary artery spasm (HCC) 07/11/2018    nitrates=HA. Amlodipine=intol swelling.  Changed to coreg 09/03/18 by cardiologist.   Depression    Endometritis 05/2017   possible; empiric tx with Flagyl by GYN.   Femoral neck fracture (HCC)    right, 01/28/23-->ORIF   Fibromyalgia    GERD (gastroesophageal reflux disease)    Hepatic steatosis 03/2019   u/s abd   Herpes zoster 08/07/2017   L scapular region   Hiatal hernia    History of adenomatous polyp of colon 04/2008; 08/2014   No high grade dysplasia: recall 5 yrs (Dr. Bosie Clos, [facility])   History of basal cell carcinoma excision    NOSE   History of Helicobacter pylori infection 04/2008   +gastric biopsy (gastritis but no metaplasia, dysplasia, or malignancy identified)   History of kidney stones    History of TIA (transient ischemic attack)    secondary to HELLP syndrome 1994 post c/s--  residual memory loss   Hyperlipidemia    statin started after her NSTEMI: goal  LDL <70.   Hypertension    Ischemic cardiomyopathy 05/2018   Echo EF 45-50% in context of NSTEMI from CA spasm. 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 Charlsie Merles 04/2017.   Memory loss  Abnl MRI brain and CT brain c/w chronic microvascular ischemia.  Repeat MRI brain 02/2014 stable (Dr. Radonna Ricker with no plan to f/u with neuro as of 04/2014)   NSTEMI (non-ST elevated myocardial infarction) (HCC) 05/2018   Echo EF 45-50% in context of NSTEMI from CA spasm.// Myoview 8/21: EF 66, no ischemia, low risk   Osteoarthritis of left wrist    STT and 1st [facility] jt   Osteoporosis 05/2010   2012 'penia; 06/2015 'porosis:  prolia.  DEXA 09/2017 T-score -2.2 femur neck. 11/2021 T score -2.2. Rpt 2 yrs   Pelvic floor dysfunction    [facility] (Dr. Marlou Porch)   Postmenopausal vaginal  bleeding 05/2017   x 1 small episode; GYN attempted endo bx but unable to penetrate cervix due to severe cerv stenosis (due to postmenopausal state + hx of LEEP).  Endo u/s showed thin endo lining.  Per GYN---no evidence of endomet pathology on w/u---obs as of 07/2017.   Prediabetes 06/2016   Fasting gluc 105; HbA1c at that time was 6.0%.  A1c 6.2% 12/2017.  A1c 5.7% Feb 2023.   Recurrent kidney stones 08/2013   Left ureteral calculus: Perc nephr--cystoscopy w/ureteroscopy + laser for stone removal.  Residual asymptomatic left renal nephrolithiasis <5mm present post-procedure.  Right sided hydronephrosis-persistent (on u/s)--urol ordered CT to further eval 08/2016: 1.6 cm nonobstructing stone lower pole left kidney, no hydro, plan for PCN extraction ([facility])   Sepsis (HCC)    UTI   Suprapubic discomfort 06/05/2015   TMJ (temporomandibular joint disorder)    USES  MOUTH GUARD   Toe fracture, left 12/2017   2nd toe prox phalanx (immobilization, Dr. Luciana Axe)   Vitamin D deficiency 05/2011    Past Surgical History:  Procedure Laterality Date   ABIs  11/29/2019   NORMAL   ANTERIOR CERVICAL DECOMP/DISCECTOMY FUSION  06/30/2009   C3 -- C5  AND EXPLORATION OF FUSION C5-7 W/  PLATE REMOVAL   ARTHRODESIS METATARSALPHALANGEAL JOINT (MTPJ) Left 07/08/2021   Procedure: Left hallux metatarsophalangeal joint arthrodesis;  Surgeon: Toni Arthurs, MD;  Location: [facility];  Service: Orthopedics;  Laterality: Left;   BACK SURGERY     BUNIONECTOMY Left    CARDIOVASCULAR STRESS TEST  12/03/2019   myoc perf im: NORMAL   CERVICAL FUSION  2002   anterior C5 -- C7   CERVICAL SPINE SURGERY  08/27/1999     C6 -- T1  LAMINECTOMY/  DISKECTOMY   CESAREAN SECTION  1994   CHOLECYSTECTOMY N/A 03/06/2020   Procedure: LAPAROSCOPIC CHOLECYSTECTOMY;  Surgeon: Quentin Ore, MD;  Location: [facility];  Service: General;  Laterality: N/A;   COLONOSCOPY W/ POLYPECTOMY  04/2008;08/2014   2016 tubular adenoma x  1; +diverticulosis and int/ext hemorrhoids.  06/2020 adenoma, recall 5 yrs.   CYSTOSCOPY WITH URETEROSCOPY AND STENT PLACEMENT Left 08/28/2013   Procedure: CYSTOSCOPY, RGP,  WITH URETEROSCOPY AND STENT REMOVAL;  Surgeon: Valetta Fuller, MD;  Location: [facility];  Service: Urology;  Laterality: Left;   DEXA  09/2017   T-score -2.2 femur neck: improved compared to 06/2015.  2021 DEXA T score -2.3.  11/2021 DEXA T score -2.2.   ESOPHAGOGASTRODUODENOSCOPY  2010; 08/2014   2016 +Candidal esophagitis; Mild chronic gastritis w/intestinal metaplasia--NEG H pylori, neg for eosinophilic esoph. 06/2020 esoph candidiasis (diflucan x 20d), o/w normal.   HAMMER TOE SURGERY Left 07/08/2021   Procedure: Second metatarsal head excision; revision Second hammertoe correction; 2-5 percutaneus flexor tenotomies;  Surgeon: Toni Arthurs, MD;  Location: [facility];  Service: Orthopedics;  Laterality: Left;   HARDWARE REMOVAL Left 07/08/2021   Procedure: Removal of deep implants from hallux proximal phalanx and First and Second metatarsals;  Surgeon: Toni Arthurs, MD;  Location: [facility];  Service: Orthopedics;  Laterality: Left;   HIP PINNING,CANNULATED Right 01/28/2023   Procedure: PERCUTANEOUS FIXATION OF RIGHT FEMORAL NECK;  Surgeon: Yolonda Kida, MD;  [facility];  Service: Orthopedics;  Laterality: Right;   HOLMIUM LASER APPLICATION Left 08/28/2013   Procedure: HOLMIUM LASER APPLICATION;  Surgeon: Valetta Fuller, MD;  Location: [facility];  Service: Urology;  Laterality: Left;   LEFT HEART CATH AND CORONARY ANGIOGRAPHY N/A 05/15/2018   Evidence for spasm in distal LAD.  Mod RCA atherosclerosis, o/w no CAD.  Procedure: LEFT HEART CATH AND CORONARY ANGIOGRAPHY;  Surgeon: Marykay Lex, MD;  Location: [facility] IN[facility]ASIVE [facility];  Service: Cardiovascular;  Laterality: N/A;   NEGATIVE SLEEP STUDY  04/01/2007   NEPHROLITHOTOMY Left 08/05/2013   Procedure:  NEPHROLITHOTOMY PERCUTANEOUS;  Surgeon: Valetta Fuller, MD;  Location: [facility];  Service: Urology;  Laterality: Left;   POLYSOMNOGRAM  01/2019   NORMAL   POLYSOMNOGRAPHY  10/25/2018   Dr. Luetta Nutting due to pt inadequat sleep time (83 min).   TONSILLECTOMY AND ADENOIDECTOMY  1975   TOTAL KNEE ARTHROPLASTY Right 2003   TOTAL KNEE REVISION Right 01/19/2022   Procedure: Right knee polyethylene revision;  Surgeon: Ollen Gross, MD;  Location: [facility];  Service: Orthopedics;  Laterality: Right;   TRANSTHORACIC ECHOCARDIOGRAM  05/2018; 12/03/2019   (after MI secondary to arterial spasm): EF 45-50%; severe hypokinesis of apical anterolateral, inferolateral , anterior, and inferior LV myocardium. 11/2019 EF 55-60%, valves fine   ULTRASOUND EXAM,PELVIC COMPLETE ([facility] HX)  06/25/2015   NORMAL (done by GYN)    Outpatient Medications Prior to Visit  Medication Sig Dispense Refill   albuterol (VENTOLIN HFA) 108 (90 Base) MCG/ACT inhaler TAKE 2 PUFFS BY MOUTH EVERY 6 HOURS AS NEEDED FOR WHEEZE OR SHORTNESS OF BREATH 18 each 1   ALPRAZolam (XANAX) 0.5 MG tablet TAKE 1 TABLET BY MOUTH THREE TIMES A DAY AS NEEDED FOR ANXIETY 90 tablet 5   amoxicillin (AMOXIL) 500 MG capsule Take 2,000 mg by mouth daily as needed (1 hour prior to dental work).     atorvastatin (LIPITOR) 80 MG tablet TAKE 1 TABLET BY MOUTH DAILY. (Patient taking differently: Take 80 mg by mouth every evening.) 90 tablet 3   buPROPion (WELLBUTRIN XL) 150 MG 24 hr tablet Take 150 mg by mouth daily.     citalopram (CELEXA) 20 MG tablet Take 1 tablet (20 mg total) by mouth in the morning and at bedtime.     famotidine (PEPCID) 40 MG tablet Take 1 tablet (40 mg total) by mouth daily. 90 tablet 3   fluticasone (FLONASE) 50 MCG/ACT nasal spray SPRAY 2 SPRAYS INTO EACH NOSTRIL EVERY DAY 48 mL 1   KLOR-CON M20 20 MEQ tablet TAKE 1 TABLET BY MOUTH EVERY DAY 90 tablet 1   lamoTRIgine (LAMICTAL) 100 MG tablet TAKE 3 TABLETS (300 MG TOTAL)  BY MOUTH AT BEDTIME 270 tablet 2   loratadine (CLARITIN) 10 MG tablet Take 10 mg by mouth daily.     losartan (COZAAR) 50 MG tablet TAKE 1 TABLET BY MOUTH EVERY DAY 30 tablet 0   meloxicam (MOBIC) 15 MG tablet TAKE 1 TABLET BY MOUTH ONCE DAILY AS NEEDED FOR JOINT PAIN 30 tablet 2  metoprolol succinate (TOPROL-XL) 100 MG 24 hr tablet TAKE 1 TABLET BY MOUTH EVERY DAY WITH OR IMMEDIATELY FOLLOWING A MEAL 90 tablet 3   montelukast (SINGULAIR) 10 MG tablet TAKE 1 TABLET BY MOUTH EVERY DAY IN THE EVENING 90 tablet 1   pantoprazole (PROTONIX) 40 MG tablet TAKE 1 TABLET (40 MG TOTAL) BY MOUTH DAILY. KEEP F/U APPT FOR ANY FURTHER REFILLS 90 tablet 1   Tiotropium Bromide-Olodaterol (STIOLTO RESPIMAT) 2.5-2.5 MCG/ACT AERS INHALE 2 EACH INTO THE LUNGS DAILY. 4 g 0   zolpidem (AMBIEN) 10 MG tablet TAKE 1 TABLET BY MOUTH EVERYDAY AT BEDTIME 90 tablet 1   acetaminophen (TYLENOL) 500 MG tablet Take 500-1,000 mg by mouth daily as needed for mild pain or fever.     hydrochlorothiazide (HYDRODIURIL) 12.5 MG tablet TAKE 1 TABLET BY MOUTH EVERY DAY 90 tablet 3   ondansetron (ZOFRAN) 4 MG tablet Take 1 tablet (4 mg total) by mouth every 8 (eight) hours as needed for nausea or vomiting. (Patient not taking: Reported on 06/02/2023) 20 tablet 0   umeclidinium-vilanterol (ANORO ELLIPTA) 62.5-25 MCG/ACT AEPB Inhale 1 puff into the lungs daily. 90 each 3   No facility-administered medications prior to visit.    Allergies  Allergen Reactions   Ceftin [Cefuroxime Axetil] Anaphylaxis and Swelling    Swelling of eyes and tongue   Cipro [Ciprofloxacin Hcl] Anaphylaxis and Swelling   Risperdal [Risperidone] Palpitations    Generalized weakness Malaise  Hypertension   Fosamax [Alendronate Sodium] Diarrhea   Linzess [Linaclotide] Diarrhea   Morphine And Codeine Other (See Comments)    Hallucinations Disorientation    Prednisone Other (See Comments)    Hyperactivity   Roxicodone [Oxycodone] Other (See Comments)     Previously addicted to medication   Seroquel [Quetiapine] Other (See Comments)    Oversedation     Review of Systems As per HPI  PE:    06/02/2023    2:20 PM 06/02/2023    1:58 PM 05/29/2023    3:44 AM  Vitals with BMI  Height  5\' 7"    Weight  175 lbs   BMI  27.4   Systolic 138 164 161  Diastolic 72 78 77  Pulse  69 62     Physical Exam  Gen: Alert, well appearing.  Patient is oriented to person, place, time, and situation. AFFECT: pleasant, lucid thought and speech. No further exam today  LABS:  Last CBC Lab Results  Component Value Date   WBC 7.4 05/28/2023   HGB 11.2 (L) 05/28/2023   HCT 32.3 (L) 05/28/2023   MCV 89.5 05/28/2023   MCH 31.0 05/28/2023   RDW 12.2 05/28/2023   PLT 248 05/28/2023   Lab Results  Component Value Date   IRON 64 02/10/2023   TIBC 256 02/10/2023   FERRITIN 234 02/10/2023   Last metabolic panel Lab Results  Component Value Date   GLUCOSE 92 05/28/2023   NA 134 (L) 05/28/2023   K 3.8 05/28/2023   CL 99 05/28/2023   CO2 23 05/28/2023   BUN 15 05/28/2023   CREATININE 1.03 (H) 05/28/2023   GFRNONAA 58 (L) 05/28/2023   CALCIUM 9.4 05/28/2023   PROT 6.3 05/12/2023   ALBUMIN 4.5 05/12/2023   BILITOT 0.8 05/12/2023   ALKPHOS 66 05/12/2023   AST 20 05/12/2023   ALT 28 05/12/2023   ANIONGAP 12 05/28/2023   Lab Results  Component Value Date   HGBA1C 5.5 01/06/2022   IMPRESSION AND PLAN:  #1 uncontrolled hypertension.  Largely  due to uncontrolled pain. Increase HCTZ to 25 mg a day.  If blood pressure still elevated after couple of days she will increase her Toprol-XL to 1-1/2 of the 100 mg tabs daily.  Continue losartan at the 50 mg dose. Monitor renal function at recheck in the office in 1 week.  2.  Chronic neck pain and chronic hip pain. Okay to take one of the 5/325 Vicodin twice a day.  No NSAIDs due to chronic renal insufficiency. Therapeutic expectations and side effect profile of medication discussed today.  Patient's  questions answered. An After Visit Summary was printed and given to the patient.  FOLLOW UP: Return in about 1 week (around 06/09/2023) for f/u bp and pain.  Signed:  Santiago Bumpers, MD           06/02/2023

## 2023-06-03 ENCOUNTER — Other Ambulatory Visit: Payer: Self-pay | Admitting: Family Medicine

## 2023-06-05 NOTE — Telephone Encounter (Signed)
 Requesting: zolpidem Contract: no CSC for this med UDS: 05/12/23 Last Visit: 06/02/23 Next Visit: 06/09/23 Last Refill: 10/25/22 (90,1)  Please Advise. Med pending

## 2023-06-09 ENCOUNTER — Ambulatory Visit (INDEPENDENT_AMBULATORY_CARE_PROVIDER_SITE_OTHER): Payer: Medicare HMO | Admitting: Family Medicine

## 2023-06-09 ENCOUNTER — Encounter: Payer: Self-pay | Admitting: Family Medicine

## 2023-06-09 VITALS — BP 162/78 | HR 69 | Temp 98.1°F | Ht 67.0 in | Wt 175.2 lb

## 2023-06-09 DIAGNOSIS — M5412 Radiculopathy, cervical region: Secondary | ICD-10-CM

## 2023-06-09 DIAGNOSIS — G894 Chronic pain syndrome: Secondary | ICD-10-CM

## 2023-06-09 DIAGNOSIS — I10 Essential (primary) hypertension: Secondary | ICD-10-CM

## 2023-06-09 DIAGNOSIS — Z79899 Other long term (current) drug therapy: Secondary | ICD-10-CM | POA: Diagnosis not present

## 2023-06-09 MED ORDER — METOPROLOL SUCCINATE ER 100 MG PO TB24: ORAL_TABLET | ORAL | Status: DC

## 2023-06-09 MED ORDER — LOSARTAN POTASSIUM 50 MG PO TABS: ORAL_TABLET | ORAL | Status: DC

## 2023-06-09 NOTE — Patient Instructions (Signed)
 Take 1 and 1/2 of your metoprolol 100 mg tabs daily. Take 2 of your losartan 50 mg tabs daily. Continue to take 2 of your HCTZ 12.5 mg tabs daily.  You may take 2 of your hydrocodone tabs twice a day as needed for pain.

## 2023-06-09 NOTE — Progress Notes (Signed)
OFFICE VISIT  06/09/2023  CC: No chief complaint on file.   Patient is a 73 y.o. female who presents for 1 week follow-up uncontrolled hypertension and chronic neck and hip pain. A/P as of last visit: "#1 uncontrolled hypertension.  Largely due to uncontrolled pain. Increase HCTZ to 25 mg a day.  If blood pressure still elevated after couple of days she will increase her Toprol-XL to 1-1/2 of the 100 mg tabs daily.  Continue losartan at the 50 mg dose. Monitor renal function at recheck in the office in 1 week.   2.  Chronic neck pain and chronic hip pain. Okay to take one of the 5/325 Vicodin twice a day.  No NSAIDs due to chronic renal insufficiency."  INTERIM HX: Blood pressures still running in the 160s to 170s over the 70s to 100s. Denies headache, dizziness, or vision abnormalities. No lower extremity edema.  No shortness of breath or chest pain.  Vicodin 5/325 1 tab twice a day does help her neck pain some.  She denies any adverse side effects.   PMP AWARE reviewed today: most recent rx for Vicodin was filled 06/02/2023, # 30, rx by me. No red flags.  Past Medical History:  Diagnosis Date   Acute encephalopathy 11/19/2017   Alcoholism in remission [facility])    states last alcohol 11 yrs ago   Anemia    Anxiety    Bipolar 1 disorder (HCC)    Admission for mania x 2 (most recent 11/2017)   Cervical spondylosis without myelopathy 12/19/2013   MRI 02/2014: multilevel DDD/spondylosis, not much change compared to prior MRI.  Pt not in favor of invasive therapy for her neck as of 04/2014.   Chronic renal insufficiency, stage 3 (moderate) (HCC)    COPD (chronic obstructive pulmonary disease) (HCC)    Moderate: anoro started 04/2017 by pulm, pt symptomatically improved and PFTs stable at f/u 06/2017.   Coronary artery spasm (HCC) 07/11/2018   nitrates=HA. Amlodipine=intol swelling.  Changed to coreg 09/03/18 by cardiologist.   Depression    Endometritis 05/2017   possible; empiric tx  with Flagyl by GYN.   Femoral neck fracture (HCC)    right, 01/28/23-->ORIF   Fibromyalgia    GERD (gastroesophageal reflux disease)    Hepatic steatosis 03/2019   u/s abd   Herpes zoster 08/07/2017   L scapular region   Hiatal hernia    History of adenomatous polyp of colon 04/2008; 08/2014   No high grade dysplasia: recall 5 yrs (Dr. Bosie Clos, [facility])   History of basal cell carcinoma excision    NOSE   History of Helicobacter pylori infection 04/2008   +gastric biopsy (gastritis but no metaplasia, dysplasia, or malignancy identified)   History of kidney stones    History of TIA (transient ischemic attack)    secondary to HELLP syndrome 1994 post c/s--  residual memory loss   Hyperlipidemia    statin started after her NSTEMI: goal  LDL <70.   Hypertension    Ischemic cardiomyopathy 05/2018   Echo EF 45-50% in context of NSTEMI from CA spasm. 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 Charlsie Merles 04/2017.   Memory loss    Abnl MRI brain and CT brain c/w chronic microvascular ischemia.  Repeat MRI brain 02/2014 stable (Dr. Radonna Ricker  with no plan to f/u with neuro as of 04/2014)   NSTEMI (non-ST elevated myocardial infarction) (HCC) 05/2018   Echo EF 45-50% in context of NSTEMI from CA spasm.// Myoview 8/21: EF 66, no ischemia, low risk   Osteoarthritis of left wrist    STT and 1st [facility] jt   Osteoporosis 05/2010   2012 'penia; 06/2015 'porosis:  prolia.  DEXA 09/2017 T-score -2.2 femur neck. 11/2021 T score -2.2. Rpt 2 yrs   Pelvic floor dysfunction    [facility] (Dr. Marlou Porch)   Postmenopausal vaginal bleeding 05/2017   x 1 small episode; GYN attempted endo bx but unable to penetrate cervix due to severe cerv stenosis (due to postmenopausal state +  hx of LEEP).  Endo u/s showed thin endo lining.  Per GYN---no evidence of endomet pathology on w/u---obs as of 07/2017.   Prediabetes 06/2016   Fasting gluc 105; HbA1c at that time was 6.0%.  A1c 6.2% 12/2017.  A1c 5.7% Feb 2023.   Recurrent kidney stones 08/2013   Left ureteral calculus: Perc nephr--cystoscopy w/ureteroscopy + laser for stone removal.  Residual asymptomatic left renal nephrolithiasis <24mm present post-procedure.  Right sided hydronephrosis-persistent (on u/s)--urol ordered CT to further eval 08/2016: 1.6 cm nonobstructing stone lower pole left kidney, no hydro, plan for PCN extraction ([facility])   Sepsis (HCC)    UTI   Suprapubic discomfort 06/05/2015   TMJ (temporomandibular joint disorder)    USES  MOUTH GUARD   Toe fracture, left 12/2017   2nd toe prox phalanx (immobilization, Dr. Luciana Axe)   Vitamin D deficiency 05/2011    Past Surgical History:  Procedure Laterality Date   ABIs  11/29/2019   NORMAL   ANTERIOR CERVICAL DECOMP/DISCECTOMY FUSION  06/30/2009   C3 -- C5  AND EXPLORATION OF FUSION C5-7 W/  PLATE REMOVAL   ARTHRODESIS METATARSALPHALANGEAL JOINT (MTPJ) Left 07/08/2021   Procedure: Left hallux metatarsophalangeal joint arthrodesis;  Surgeon: Toni Arthurs, MD;  Location: [facility];  Service: Orthopedics;  Laterality: Left;   BACK SURGERY     BUNIONECTOMY Left    CARDIOVASCULAR STRESS TEST  12/03/2019   myoc perf im: NORMAL   CERVICAL FUSION  2002   anterior C5 -- C7   CERVICAL SPINE SURGERY  08/27/1999     C6 -- T1  LAMINECTOMY/  DISKECTOMY   CESAREAN SECTION  1994   CHOLECYSTECTOMY N/A 03/06/2020   Procedure: LAPAROSCOPIC CHOLECYSTECTOMY;  Surgeon: Quentin Ore, MD;  Location: [facility] OR;  Service: General;  Laterality: N/A;   COLONOSCOPY W/ POLYPECTOMY  04/2008;08/2014   2016 tubular adenoma x 1; +diverticulosis and int/ext hemorrhoids.  06/2020 adenoma, recall 5 yrs.   CYSTOSCOPY WITH URETEROSCOPY AND STENT PLACEMENT Left 08/28/2013    Procedure: CYSTOSCOPY, RGP,  WITH URETEROSCOPY AND STENT REMOVAL;  Surgeon: Valetta Fuller, MD;  Location: [facility];  Service: Urology;  Laterality: Left;   DEXA  09/2017   T-score -2.2 femur neck: improved compared to 06/2015.  2021 DEXA T score -2.3.  11/2021 DEXA T score -2.2.   ESOPHAGOGASTRODUODENOSCOPY  2010; 08/2014   2016 +Candidal esophagitis; Mild chronic gastritis w/intestinal metaplasia--NEG H pylori, neg for eosinophilic esoph. 06/2020 esoph candidiasis (diflucan x 20d), o/w normal.   HAMMER TOE SURGERY Left 07/08/2021   Procedure: Second metatarsal head excision; revision Second hammertoe correction; 2-5 percutaneus flexor tenotomies;  Surgeon: Toni Arthurs, MD;  Location: [facility];  Service: Orthopedics;  Laterality: Left;   HARDWARE REMOVAL Left 07/08/2021   Procedure:  Removal of deep implants from hallux proximal phalanx and First and Second metatarsals;  Surgeon: Toni Arthurs, MD;  Location: [facility];  Service: Orthopedics;  Laterality: Left;   HIP PINNING,CANNULATED Right 01/28/2023   Procedure: PERCUTANEOUS FIXATION OF RIGHT FEMORAL NECK;  Surgeon: Yolonda Kida, MD;  Location: [facility] OR;  Service: Orthopedics;  Laterality: Right;   HOLMIUM LASER APPLICATION Left 08/28/2013   Procedure: HOLMIUM LASER APPLICATION;  Surgeon: Valetta Fuller, MD;  Location: [facility];  Service: Urology;  Laterality: Left;   LEFT HEART CATH AND CORONARY ANGIOGRAPHY N/A 05/15/2018   Evidence for spasm in distal LAD.  Mod RCA atherosclerosis, o/w no CAD.  Procedure: LEFT HEART CATH AND CORONARY ANGIOGRAPHY;  Surgeon: Marykay Lex, MD;  Location: [facility]ASIVE [facility];  Service: Cardiovascular;  Laterality: N/A;   NEGATIVE SLEEP STUDY  04/01/2007   NEPHROLITHOTOMY Left 08/05/2013   Procedure: NEPHROLITHOTOMY PERCUTANEOUS;  Surgeon: Valetta Fuller, MD;  Location: [facility];  Service: Urology;  Laterality: Left;   POLYSOMNOGRAM  01/2019    NORMAL   POLYSOMNOGRAPHY  10/25/2018   Dr. Luetta Nutting due to pt inadequat sleep time (83 min).   TONSILLECTOMY AND ADENOIDECTOMY  1975   TOTAL KNEE ARTHROPLASTY Right 2003   TOTAL KNEE REVISION Right 01/19/2022   Procedure: Right knee polyethylene revision;  Surgeon: Ollen Gross, MD;  Location: [facility];  Service: Orthopedics;  Laterality: Right;   TRANSTHORACIC ECHOCARDIOGRAM  05/2018; 12/03/2019   (after MI secondary to arterial spasm): EF 45-50%; severe hypokinesis of apical anterolateral, inferolateral , anterior, and inferior LV myocardium. 11/2019 EF 55-60%, valves fine   ULTRASOUND EXAM,PELVIC COMPLETE ([facility] HX)  06/25/2015   NORMAL (done by GYN)    Outpatient Medications Prior to Visit  Medication Sig Dispense Refill   albuterol (VENTOLIN HFA) 108 (90 Base) MCG/ACT inhaler TAKE 2 PUFFS BY MOUTH EVERY 6 HOURS AS NEEDED FOR WHEEZE OR SHORTNESS OF BREATH 18 each 1   ALPRAZolam (XANAX) 0.5 MG tablet TAKE 1 TABLET BY MOUTH THREE TIMES A DAY AS NEEDED FOR ANXIETY 90 tablet 5   atorvastatin (LIPITOR) 80 MG tablet TAKE 1 TABLET BY MOUTH DAILY. (Patient taking differently: Take 80 mg by mouth every evening.) 90 tablet 3   buPROPion (WELLBUTRIN XL) 150 MG 24 hr tablet Take 150 mg by mouth daily.     citalopram (CELEXA) 20 MG tablet Take 1 tablet (20 mg total) by mouth in the morning and at bedtime.     famotidine (PEPCID) 40 MG tablet Take 1 tablet (40 mg total) by mouth daily. 90 tablet 3   fluticasone (FLONASE) 50 MCG/ACT nasal spray SPRAY 2 SPRAYS INTO EACH NOSTRIL EVERY DAY 48 mL 1   hydrochlorothiazide (HYDRODIURIL) 12.5 MG tablet 2 tabs po qd     HYDROcodone-acetaminophen (NORCO/VICODIN) 5-325 MG tablet 1 tab po bid as needed for pain 30 tablet 0   KLOR-CON M20 20 MEQ tablet TAKE 1 TABLET BY MOUTH EVERY DAY 90 tablet 1   lamoTRIgine (LAMICTAL) 100 MG tablet TAKE 3 TABLETS (300 MG TOTAL) BY MOUTH AT BEDTIME 270 tablet 2   loratadine (CLARITIN) 10 MG tablet Take 10 mg by  mouth daily.     meloxicam (MOBIC) 15 MG tablet TAKE 1 TABLET BY MOUTH ONCE DAILY AS NEEDED FOR JOINT PAIN 30 tablet 2   montelukast (SINGULAIR) 10 MG tablet TAKE 1 TABLET BY MOUTH EVERY DAY IN THE EVENING 90 tablet 1   pantoprazole (PROTONIX) 40 MG tablet TAKE 1  TABLET (40 MG TOTAL) BY MOUTH DAILY. KEEP F/U APPT FOR ANY FURTHER REFILLS 90 tablet 1   Tiotropium Bromide-Olodaterol (STIOLTO RESPIMAT) 2.5-2.5 MCG/ACT AERS INHALE 2 EACH INTO THE LUNGS DAILY. 4 g 0   zolpidem (AMBIEN) 10 MG tablet TAKE 1 TABLET BY MOUTH EVERYDAY AT BEDTIME 90 tablet 1   losartan (COZAAR) 50 MG tablet TAKE 1 TABLET BY MOUTH EVERY DAY 30 tablet 0   metoprolol succinate (TOPROL-XL) 100 MG 24 hr tablet TAKE 1 TABLET BY MOUTH EVERY DAY WITH OR IMMEDIATELY FOLLOWING A MEAL 90 tablet 3   amoxicillin (AMOXIL) 500 MG capsule Take 2,000 mg by mouth daily as needed (1 hour prior to dental work). (Patient not taking: Reported on 06/09/2023)     ondansetron (ZOFRAN) 4 MG tablet Take 1 tablet (4 mg total) by mouth every 8 (eight) hours as needed for nausea or vomiting. (Patient not taking: Reported on 06/09/2023) 20 tablet 0   No facility-administered medications prior to visit.    Allergies  Allergen Reactions   Ceftin [Cefuroxime Axetil] Anaphylaxis and Swelling    Swelling of eyes and tongue   Cipro [Ciprofloxacin Hcl] Anaphylaxis and Swelling   Risperdal [Risperidone] Palpitations    Generalized weakness Malaise  Hypertension   Fosamax [Alendronate Sodium] Diarrhea   Linzess [Linaclotide] Diarrhea   Morphine And Codeine Other (See Comments)    Hallucinations Disorientation    Prednisone Other (See Comments)    Hyperactivity   Roxicodone [Oxycodone] Other (See Comments)    Previously addicted to medication   Seroquel [Quetiapine] Other (See Comments)    Oversedation     Review of Systems As per HPI  PE:    06/09/2023   11:25 AM 06/09/2023   11:22 AM 06/02/2023    2:20 PM  Vitals with BMI  Height  5\' 7"    Weight   175 lbs 3 oz   BMI  27.43   Systolic 162 179 161  Diastolic 78 102 72  Pulse 69 72      Physical Exam  Gen: Alert, well appearing.  Patient is oriented to person, place, time, and situation. AFFECT: pleasant, lucid thought and speech. No further exam today  LABS:  Last metabolic panel Lab Results  Component Value Date   GLUCOSE 92 05/28/2023   NA 134 (L) 05/28/2023   K 3.8 05/28/2023   CL 99 05/28/2023   CO2 23 05/28/2023   BUN 15 05/28/2023   CREATININE 1.03 (H) 05/28/2023   GFRNONAA 58 (L) 05/28/2023   CALCIUM 9.4 05/28/2023   PROT 6.3 05/12/2023   ALBUMIN 4.5 05/12/2023   BILITOT 0.8 05/12/2023   ALKPHOS 66 05/12/2023   AST 20 05/12/2023   ALT 28 05/12/2023   ANIONGAP 12 05/28/2023      IMPRESSION AND PLAN:  #1 uncontrolled hypertension. Increase Toprol-XL to 1 and 1/2 of the 100 mg tabs daily. Increase losartan to 2 of the 50 mg tabs daily. Continue 2 of the HCTZ 12.5 mg tabs daily. Basic metabolic panel today.  2.  Chronic cervical radiculopathy. Pain just a little bit improved with use of Vicodin 5/325 1 tab twice a day. Okay to increase to 2 tabs twice a day. When her blood pressure gets better controlled she will start physical therapy.  An After Visit Summary was printed and given to the patient.  FOLLOW UP: Return in about 1 week (around 06/16/2023) for recheck of current acute problem.  Signed:  Santiago Bumpers, MD  06/09/2023  

## 2023-06-10 LAB — BASIC METABOLIC PANEL
BUN/Creatinine Ratio: 19 (calc) (ref 6–22)
BUN: 19 mg/dL (ref 7–25)
CO2: 30 mmol/L (ref 20–32)
Calcium: 10.1 mg/dL (ref 8.6–10.4)
Chloride: 99 mmol/L (ref 98–110)
Creat: 1.02 mg/dL — ABNORMAL HIGH (ref 0.60–1.00)
Glucose, Bld: 90 mg/dL (ref 65–99)
Potassium: 4.1 mmol/L (ref 3.5–5.3)
Sodium: 137 mmol/L (ref 135–146)

## 2023-06-11 ENCOUNTER — Encounter: Payer: Self-pay | Admitting: Family Medicine

## 2023-06-13 DIAGNOSIS — M7061 Trochanteric bursitis, right hip: Secondary | ICD-10-CM | POA: Diagnosis not present

## 2023-06-15 ENCOUNTER — Other Ambulatory Visit: Payer: Self-pay | Admitting: Family Medicine

## 2023-06-16 ENCOUNTER — Other Ambulatory Visit: Payer: Self-pay | Admitting: Family Medicine

## 2023-06-16 MED ORDER — HYDROCODONE-ACETAMINOPHEN 5-325 MG PO TABS: ORAL_TABLET | ORAL | 0 refills | Status: DC

## 2023-06-16 NOTE — Telephone Encounter (Signed)
 Requesting: HYDROcodone-acetaminophen (NORCO/VICODIN) 5-325 MG tablet  Contract: 05/12/23 UDS: 05/12/23 Last Visit: 06/09/23 Next Visit: 06/20/23 Last Refill: 06/02/23 (30,0)  Please Advise. Med pending

## 2023-06-16 NOTE — Telephone Encounter (Unsigned)
 Copied from CRM [id number]. Topic: Clinical - Medication Refill >> Jun 16, 2023  3:27 PM Elizebeth Brooking wrote: Most Recent Primary Care Visit:  Provider: Jeoffrey Massed  Department: [facility]  Visit Type: OFFICE VISIT  Date: 06/09/2023  Medication: HYDROcodone-acetaminophen (NORCO/VICODIN) 5-325 MG tablet   Has the patient contacted their pharmacy? Yes (Agent: If no, request that the patient contact the pharmacy for the refill. If patient does not wish to contact the pharmacy document the reason why and proceed with request.) (Agent: If yes, when and what did the pharmacy advise?)  Is this the correct pharmacy for this prescription? Yes If no, delete pharmacy and type the correct one.  This is the patient's preferred pharmacy:  [id number] [place], [place] - [address] [place] [address] Phone: [phone] Fax: [phone]   Has the prescription been filled recently? No  Is the patient out of the medication? Yes  Has the patient been seen for an appointment in the last year OR does the patient have an upcoming appointment? Yes  Can we respond through MyChart? Yes  Agent: Please be advised that Rx refills may take up to 3 business days. We ask that you follow-up with your pharmacy.

## 2023-06-16 NOTE — Telephone Encounter (Signed)
 New vicodin rx sent today with new sig and disp #, as per our conversation at most recent o/v (5/325, 1-2 bid, #90).

## 2023-06-20 ENCOUNTER — Encounter: Payer: Self-pay | Admitting: Family Medicine

## 2023-06-20 ENCOUNTER — Ambulatory Visit (INDEPENDENT_AMBULATORY_CARE_PROVIDER_SITE_OTHER): Admitting: Family Medicine

## 2023-06-20 ENCOUNTER — Ambulatory Visit (HOSPITAL_BASED_OUTPATIENT_CLINIC_OR_DEPARTMENT_OTHER)
Admission: RE | Admit: 2023-06-20 | Discharge: 2023-06-20 | Disposition: A | Discharge status: Home / Self Care | Source: Ambulatory Visit | Attending: Family Medicine | Admitting: Family Medicine

## 2023-06-20 VITALS — BP 164/84 | HR 65 | Temp 98.0°F | Ht 67.0 in | Wt 177.4 lb

## 2023-06-20 DIAGNOSIS — R195 Other fecal abnormalities: Secondary | ICD-10-CM | POA: Diagnosis not present

## 2023-06-20 DIAGNOSIS — R0609 Other forms of dyspnea: Secondary | ICD-10-CM | POA: Insufficient documentation

## 2023-06-20 DIAGNOSIS — R0602 Shortness of breath: Secondary | ICD-10-CM | POA: Diagnosis not present

## 2023-06-20 DIAGNOSIS — K5909 Other constipation: Secondary | ICD-10-CM | POA: Diagnosis not present

## 2023-06-20 DIAGNOSIS — I10 Essential (primary) hypertension: Secondary | ICD-10-CM

## 2023-06-20 DIAGNOSIS — J449 Chronic obstructive pulmonary disease, unspecified: Secondary | ICD-10-CM | POA: Diagnosis not present

## 2023-06-20 DIAGNOSIS — K5900 Constipation, unspecified: Secondary | ICD-10-CM | POA: Diagnosis not present

## 2023-06-20 DIAGNOSIS — R059 Cough, unspecified: Secondary | ICD-10-CM | POA: Diagnosis not present

## 2023-06-20 MED ORDER — LACTULOSE 10 GM/15ML PO SOLN: 20.0000 g | Freq: Three times a day (TID) | ORAL | 0 refills | Status: DC

## 2023-06-20 NOTE — Patient Instructions (Signed)
 Take 1 capful of miralax three times a day.  Take 30 ml of lactulose three times per day.  Use one over the counter fleets enema three times per day.  When you have good evacuation of your colon then decrease to 1 capful miralax once a day and 30 ml of lactulose once a day.

## 2023-06-20 NOTE — Progress Notes (Signed)
 OFFICE VISIT  06/20/2023  CC: No chief complaint on file.   Patient is a 73 y.o. female who presents for 10-day follow-up uncontrolled hypertension. A/P as of last visit: "#1 uncontrolled hypertension. Increase Toprol-XL to 1 and 1/2 of the 100 mg tabs daily. Increase losartan to 2 of the 50 mg tabs daily. Continue 2 of the HCTZ 12.5 mg tabs daily. Basic metabolic panel today.   2.  Chronic cervical radiculopathy. Pain just a little bit improved with use of Vicodin 5/325 1 tab twice a day. Okay to increase to 2 tabs twice a day. When her blood pressure gets better controlled she will start physical therapy."   INTERIM HX: Blood pressures still elevated into the 160s systolic at home.  She is very concerned about significant worsening of her chronic constipation over the last 2 months.  She states she has not had a normal bowel movement in 10 to 14 days.  She has little pebbles occasionally.  Feels like abdomen is bloated/distended.  She denies any black stool or visible blood in stool.  He eats approximately 2 meals per day without abdominal pain, nausea, or vomiting. She takes some hydrocodone for pain but an average of 1 to 2 pills every few days. Lately has been taking MiraLAX 1 capful every other day, Colace daily, and fiber supplement daily.  She states that over-the-counter enemas hardly work at all for her. In review of gastroenterology notes she was prescribed Amitiza back in July 2024.  She states she stopped this not long after starting it because it was "nasty".  Additionally, she complains of significant dyspnea on exertion.  Approximately several months.  No chest pain.  No shortness of breath at rest.  She feels like she may have a little bit of wheezing.  She takes Stiolto Respimat 2.5-2.5, 2 puffs once a day.  She does use albuterol inhaler some and does find a little bit helpful. No lower extremity swelling.  ROS as above, plus--> no fevers, no dizziness, no HAs, no  rashes. No polyuria or polydipsia.   No focal weakness, paresthesias, or tremors.   No sore throat, no voice changes, no dysphagia.  Past Medical History:  Diagnosis Date   Acute encephalopathy 11/19/2017   Alcoholism in remission [facility])    states last alcohol 11 yrs ago   Anemia    Anxiety    Bipolar 1 disorder (HCC)    Admission for mania x 2 (most recent 11/2017)   Cervical spondylosis without myelopathy 12/19/2013   MRI 02/2014: multilevel DDD/spondylosis, not much change compared to prior MRI.  Pt not in favor of invasive therapy for her neck as of 04/2014.   Chronic renal insufficiency, stage 3 (moderate) (HCC)    COPD (chronic obstructive pulmonary disease) (HCC)    Moderate: anoro started 04/2017 by pulm, pt symptomatically improved and PFTs stable at f/u 06/2017.   Coronary artery spasm (HCC) 07/11/2018   nitrates=HA. Amlodipine=intol swelling.  Changed to coreg 09/03/18 by cardiologist.   Depression    Endometritis 05/2017   possible; empiric tx with Flagyl by GYN.   Femoral neck fracture (HCC)    right, 01/28/23-->ORIF   Fibromyalgia    GERD (gastroesophageal reflux disease)    Hepatic steatosis 03/2019   u/s abd   Herpes zoster 08/07/2017   L scapular region   Hiatal hernia    History of adenomatous polyp of colon 04/2008; 08/2014   No high grade dysplasia: recall 5 yrs (Dr. Bosie Clos, [facility])  History of basal cell carcinoma excision    NOSE   History of Helicobacter pylori infection 04/2008   +gastric biopsy (gastritis but no metaplasia, dysplasia, or malignancy identified)   History of kidney stones    History of TIA (transient ischemic attack)    secondary to HELLP syndrome 1994 post c/s--  residual memory loss   Hyperlipidemia    statin started after her NSTEMI: goal  LDL <70.   Hypertension    Ischemic cardiomyopathy 05/2018   Echo EF 45-50% in context of NSTEMI from CA spasm. // Echo 8/21: 55-60, mild LVH, normal RVSF, RVSP 40, trivial MR   Left foot pain     Hallux deformity+adhesive capsulitis+ hammertoe:  Severe structural bunion deformity with hallux interphalangeus and severe arthritis of the second MPJ left foot--surgical repair/osteotomies by Dr. Charlsie Merles 04/2017.   Memory loss    Abnl MRI brain and CT brain c/w chronic microvascular ischemia.  Repeat MRI brain 02/2014 stable (Dr. Radonna Ricker with no plan to f/u with neuro as of 04/2014)   NSTEMI (non-ST elevated myocardial infarction) (HCC) 05/2018   Echo EF 45-50% in context of NSTEMI from CA spasm.// Myoview 8/21: EF 66, no ischemia, low risk   Osteoarthritis of left wrist    STT and 1st [facility] jt   Osteoporosis 05/2010   2012 'penia; 06/2015 'porosis:  prolia.  DEXA 09/2017 T-score -2.2 femur neck. 11/2021 T score -2.2. Rpt 2 yrs   Pelvic floor dysfunction    [facility] (Dr. Marlou Porch)   Postmenopausal vaginal bleeding 05/2017   x 1 small episode; GYN attempted endo bx but unable to penetrate cervix due to severe cerv stenosis (due to postmenopausal state + hx of LEEP).  Endo u/s showed thin endo lining.  Per GYN---no evidence of endomet pathology on w/u---obs as of 07/2017.   Prediabetes 06/2016   Fasting gluc 105; HbA1c at that time was 6.0%.  A1c 6.2% 12/2017.  A1c 5.7% Feb 2023.   Recurrent kidney stones 08/2013   Left ureteral calculus: Perc nephr--cystoscopy w/ureteroscopy + laser for stone removal.  Residual asymptomatic left renal nephrolithiasis <42mm present post-procedure.  Right sided hydronephrosis-persistent (on u/s)--urol ordered CT to further eval 08/2016: 1.6 cm nonobstructing stone lower pole left kidney, no hydro, plan for PCN extraction ([facility])   Sepsis (HCC)    UTI   Suprapubic discomfort 06/05/2015   TMJ (temporomandibular joint disorder)    USES  MOUTH GUARD   Toe fracture, left 12/2017   2nd toe prox phalanx (immobilization, Dr. Luciana Axe)   Vitamin D deficiency 05/2011    Past Surgical History:  Procedure Laterality Date   ABIs  11/29/2019   NORMAL   ANTERIOR CERVICAL  DECOMP/DISCECTOMY FUSION  06/30/2009   C3 -- C5  AND EXPLORATION OF FUSION C5-7 W/  PLATE REMOVAL   ARTHRODESIS METATARSALPHALANGEAL JOINT (MTPJ) Left 07/08/2021   Procedure: Left hallux metatarsophalangeal joint arthrodesis;  Surgeon: Toni Arthurs, MD;  Location: [facility];  Service: Orthopedics;  Laterality: Left;   BACK SURGERY     BUNIONECTOMY Left    CARDIOVASCULAR STRESS TEST  12/03/2019   myoc perf im: NORMAL   CERVICAL FUSION  2002   anterior C5 -- C7   CERVICAL SPINE SURGERY  08/27/1999     C6 -- T1  LAMINECTOMY/  DISKECTOMY   CESAREAN SECTION  1994   CHOLECYSTECTOMY N/A 03/06/2020   Procedure: LAPAROSCOPIC CHOLECYSTECTOMY;  Surgeon: Quentin Ore, MD;  Location: [facility];  Service: General;  Laterality: N/A;  COLONOSCOPY W/ POLYPECTOMY  04/2008;08/2014   2016 tubular adenoma x 1; +diverticulosis and int/ext hemorrhoids.  06/2020 adenoma, recall 5 yrs.   CYSTOSCOPY WITH URETEROSCOPY AND STENT PLACEMENT Left 08/28/2013   Procedure: CYSTOSCOPY, RGP,  WITH URETEROSCOPY AND STENT REMOVAL;  Surgeon: Valetta Fuller, MD;  Location: [facility];  Service: Urology;  Laterality: Left;   DEXA  09/2017   T-score -2.2 femur neck: improved compared to 06/2015.  2021 DEXA T score -2.3.  11/2021 DEXA T score -2.2.   ESOPHAGOGASTRODUODENOSCOPY  2010; 08/2014   2016 +Candidal esophagitis; Mild chronic gastritis w/intestinal metaplasia--NEG H pylori, neg for eosinophilic esoph. 06/2020 esoph candidiasis (diflucan x 20d), o/w normal.   HAMMER TOE SURGERY Left 07/08/2021   Procedure: Second metatarsal head excision; revision Second hammertoe correction; 2-5 percutaneus flexor tenotomies;  Surgeon: Toni Arthurs, MD;  Location: [facility];  Service: Orthopedics;  Laterality: Left;   HARDWARE REMOVAL Left 07/08/2021   Procedure: Removal of deep implants from hallux proximal phalanx and First and Second metatarsals;  Surgeon: Toni Arthurs, MD;  Location: [facility];  Service: Orthopedics;  Laterality: Left;   HIP PINNING,CANNULATED Right 01/28/2023   Procedure: PERCUTANEOUS FIXATION OF RIGHT FEMORAL NECK;  Surgeon: Yolonda Kida, MD;  Location: [facility] OR;  Service: Orthopedics;  Laterality: Right;   HOLMIUM LASER APPLICATION Left 08/28/2013   Procedure: HOLMIUM LASER APPLICATION;  Surgeon: Valetta Fuller, MD;  Location: [facility];  Service: Urology;  Laterality: Left;   LEFT HEART CATH AND CORONARY ANGIOGRAPHY N/A 05/15/2018   Evidence for spasm in distal LAD.  Mod RCA atherosclerosis, o/w no CAD.  Procedure: LEFT HEART CATH AND CORONARY ANGIOGRAPHY;  Surgeon: Marykay Lex, MD;  Location: [facility];  Service: Cardiovascular;  Laterality: N/A;   NEGATIVE SLEEP STUDY  04/01/2007   NEPHROLITHOTOMY Left 08/05/2013   Procedure: NEPHROLITHOTOMY PERCUTANEOUS;  Surgeon: Valetta Fuller, MD;  Location: [facility];  Service: Urology;  Laterality: Left;   POLYSOMNOGRAM  01/2019   NORMAL   POLYSOMNOGRAPHY  10/25/2018   Dr. Luetta Nutting due to pt inadequat sleep time (83 min).   TONSILLECTOMY AND ADENOIDECTOMY  1975   TOTAL KNEE ARTHROPLASTY Right 2003   TOTAL KNEE REVISION Right 01/19/2022   Procedure: Right knee polyethylene revision;  Surgeon: Ollen Gross, MD;  Location: [facility];  Service: Orthopedics;  Laterality: Right;   TRANSTHORACIC ECHOCARDIOGRAM  05/2018; 12/03/2019   (after MI secondary to arterial spasm): EF 45-50%; severe hypokinesis of apical anterolateral, inferolateral , anterior, and inferior LV myocardium. 11/2019 EF 55-60%, valves fine   ULTRASOUND EXAM,PELVIC COMPLETE ([facility] HX)  06/25/2015   NORMAL (done by GYN)    Outpatient Medications Prior to Visit  Medication Sig Dispense Refill   albuterol (VENTOLIN HFA) 108 (90 Base) MCG/ACT inhaler TAKE 2 PUFFS BY MOUTH EVERY 6 HOURS AS NEEDED FOR WHEEZE OR SHORTNESS OF BREATH 18 each 1   ALPRAZolam (XANAX) 0.5 MG tablet TAKE 1 TABLET BY  MOUTH THREE TIMES A DAY AS NEEDED FOR ANXIETY 90 tablet 5   atorvastatin (LIPITOR) 80 MG tablet TAKE 1 TABLET BY MOUTH DAILY. (Patient taking differently: Take 80 mg by mouth every evening.) 90 tablet 3   buPROPion (WELLBUTRIN XL) 150 MG 24 hr tablet Take 150 mg by mouth daily.     citalopram (CELEXA) 20 MG tablet Take 1 tablet (20 mg total) by mouth in the morning and at bedtime.     famotidine (PEPCID) 40 MG  tablet Take 1 tablet (40 mg total) by mouth daily. 90 tablet 3   fluticasone (FLONASE) 50 MCG/ACT nasal spray SPRAY 2 SPRAYS INTO EACH NOSTRIL EVERY DAY 48 mL 1   hydrochlorothiazide (HYDRODIURIL) 12.5 MG tablet 2 tabs po qd     HYDROcodone-acetaminophen (NORCO/VICODIN) 5-325 MG tablet 1-2 tab po bid as needed for pain 90 tablet 0   KLOR-CON M20 20 MEQ tablet TAKE 1 TABLET BY MOUTH EVERY DAY 90 tablet 1   lamoTRIgine (LAMICTAL) 100 MG tablet TAKE 3 TABLETS (300 MG TOTAL) BY MOUTH AT BEDTIME 270 tablet 2   loratadine (CLARITIN) 10 MG tablet Take 10 mg by mouth daily.     losartan (COZAAR) 50 MG tablet 2 tabs p.o. daily     meloxicam (MOBIC) 15 MG tablet TAKE 1 TABLET BY MOUTH ONCE DAILY AS NEEDED FOR JOINT PAIN 30 tablet 2   metoprolol succinate (TOPROL-XL) 100 MG 24 hr tablet 1 and 1/2 tabs p.o. daily.  Take with or immediately following a meal.     montelukast (SINGULAIR) 10 MG tablet TAKE 1 TABLET BY MOUTH EVERY DAY IN THE EVENING 90 tablet 1   pantoprazole (PROTONIX) 40 MG tablet TAKE 1 TABLET (40 MG TOTAL) BY MOUTH DAILY. KEEP F/U APPT FOR ANY FURTHER REFILLS 90 tablet 1   Tiotropium Bromide-Olodaterol (STIOLTO RESPIMAT) 2.5-2.5 MCG/ACT AERS INHALE 2 EACH INTO THE LUNGS DAILY. 4 g 0   zolpidem (AMBIEN) 10 MG tablet TAKE 1 TABLET BY MOUTH EVERYDAY AT BEDTIME 90 tablet 1   amoxicillin (AMOXIL) 500 MG capsule Take 2,000 mg by mouth daily as needed (1 hour prior to dental work). (Patient not taking: Reported on 06/09/2023)     ondansetron (ZOFRAN) 4 MG tablet Take 1 tablet (4 mg total) by  mouth every 8 (eight) hours as needed for nausea or vomiting. (Patient not taking: Reported on 06/02/2023) 20 tablet 0   No facility-administered medications prior to visit.    Allergies  Allergen Reactions   Ceftin [Cefuroxime Axetil] Anaphylaxis and Swelling    Swelling of eyes and tongue   Cipro [Ciprofloxacin Hcl] Anaphylaxis and Swelling   Risperdal [Risperidone] Palpitations    Generalized weakness Malaise  Hypertension   Fosamax [Alendronate Sodium] Diarrhea   Linzess [Linaclotide] Diarrhea   Morphine And Codeine Other (See Comments)    Hallucinations Disorientation    Prednisone Other (See Comments)    Hyperactivity   Roxicodone [Oxycodone] Other (See Comments)    Previously addicted to medication   Seroquel [Quetiapine] Other (See Comments)    Oversedation     Review of Systems As per HPI  PE:    06/20/2023   11:02 AM 06/09/2023   11:25 AM 06/09/2023   11:22 AM  Vitals with BMI  Height 5\' 7"   5\' 7"   Weight 177 lbs 6 oz  175 lbs 3 oz  BMI 27.78  27.43  Systolic 164 162 161  Diastolic 84 78 102  Pulse 65 69 72    Exam chaperoned by Emi Holes, CMA.  Physical Exam  Gen: Alert, well appearing.  Patient is oriented to person, place, time, and situation. AFFECT: pleasant, lucid thought and speech. Cardiovascular: Regular rhythm and rate without murmur. Lungs clear to auscultation bilaterally on inspiration.  Question of subtle wheeze on expiration, mild diminished breath sounds throughout chest, mild prolongation of expiratory phase.   Abdomen: Soft, mild distention, bowel sounds hypoactive.  No mass or tenderness. Extremities: No edema. Rectal: Mildly diminished rectal tone.  No stool palpable in vault,  no mass. Light brown stool wiping's on glove---> Hemoccult positive 1 out of 2.  LABS:  Last metabolic panel Lab Results  Component Value Date   GLUCOSE 90 06/09/2023   NA 137 06/09/2023   K 4.1 06/09/2023   CL 99 06/09/2023   CO2 30 06/09/2023    BUN 19 06/09/2023   CREATININE 1.02 (H) 06/09/2023   GFRNONAA 58 (L) 05/28/2023   CALCIUM 10.1 06/09/2023   PROT 6.3 05/12/2023   ALBUMIN 4.5 05/12/2023   BILITOT 0.8 05/12/2023   ALKPHOS 66 05/12/2023   AST 20 05/12/2023   ALT 28 05/12/2023   ANIONGAP 12 05/28/2023   Lab Results  Component Value Date   WBC 7.4 05/28/2023   HGB 11.2 (L) 05/28/2023   HCT 32.3 (L) 05/28/2023   MCV 89.5 05/28/2023   PLT 248 05/28/2023   Lab Results  Component Value Date   IRON 64 02/10/2023   TIBC 256 02/10/2023   FERRITIN 234 02/10/2023   IMPRESSION AND PLAN:  #1 hypertension, poor control.  We do not address this with any medication changes today due to focus on constipation and dyspnea on exertion. Blood pressure initially elevated here with normal adult cuff.  Large cuff used on recheck and it was 112/60. Her cuff at home is a normal adult size.  2.  Chronic constipation, worsening x 2 months. I encouraged her to arrange follow-up with her gastroenterologist, Dr. Rhea Belton. In the meantime we will get more aggressive with her regimen: Increase MiraLAX to 1 capful 3 times a day.  Start lactulose 30 mL 3 times a day.  Use over-the-counter fleets enema 3 times a day. After she has a large evacuation or 2 she can then cut back to 1 dose of MiraLAX a day and 1 dose of lactulose a day. Check KUB today.  3.  Hemoccult positive stool. Hemoglobin 11.2 last month and normal iron back in November last year. Her last colonoscopy was approximately 3 years ago. She does not take NSAIDs with any regularity. She currently takes pantoprazole 40 mg a day and famotidine 40 mg a day. Encouraged her to arrange follow-up with GI.  4.  Dyspnea on exertion. She does have COPD. She has seen Dr. Isaiah Serge and she is on Stiolto Respimat 2 puffs daily. She has albuterol to use every 6 hours as needed. Checking chest x-ray today.  An After Visit Summary was printed and given to the patient.  Spent 50 min with pt  today reviewing HPI, reviewing relevant past history, doing exam, reviewing and discussing lab and imaging data, and formulating plans.  FOLLOW UP: Return in about 1 week (around 06/27/2023) for recheck of current acute problem.  Signed:  Santiago Bumpers, MD           06/20/2023

## 2023-06-21 ENCOUNTER — Telehealth: Payer: Self-pay | Admitting: Pulmonary Disease

## 2023-06-21 NOTE — Telephone Encounter (Unsigned)
 Copied from CRM [id number]. Topic: Clinical - Lab/Test Results >> Jun 21, 2023  1:58 PM Elizebeth Brooking wrote: Reason for CRM: Patient called in would like for someone to give her a callback regarding imaging results

## 2023-06-22 ENCOUNTER — Telehealth: Payer: Self-pay

## 2023-06-22 ENCOUNTER — Other Ambulatory Visit: Payer: Self-pay | Admitting: Family Medicine

## 2023-06-22 NOTE — Telephone Encounter (Signed)
 Spoke with pt to advise we have not received results yet

## 2023-06-22 NOTE — Telephone Encounter (Signed)
 error

## 2023-06-22 NOTE — Telephone Encounter (Signed)
 Copied from CRM [id number]. Topic: Clinical - Lab/Test Results >> Jun 21, 2023  1:58 PM Elizebeth Brooking wrote: Reason for CRM: Patient called in would like for someone to give her a callback regarding imaging results

## 2023-06-23 ENCOUNTER — Telehealth: Payer: Self-pay | Admitting: Pulmonary Disease

## 2023-06-23 ENCOUNTER — Encounter: Payer: Self-pay | Admitting: Family Medicine

## 2023-06-23 ENCOUNTER — Telehealth: Payer: Self-pay

## 2023-06-23 NOTE — Telephone Encounter (Signed)
 Erin Lopez   06/23/2023 11:33 AM  Pt called in requesting a callback regarding this, she states she was told her results haven't came back yet but they are available on my chart. She wants to go over the results     PCP has not gotten results yet. We will contact once we get results from PCP

## 2023-06-23 NOTE — Telephone Encounter (Signed)
 Called and spoke to patient. She is requesting refill on Stiolto. She was last seen 2023 but has a pending appt for 07/07/2023. One month supply has been sent to preferred pharmacy. She stated that she does not get this medication through [facility].  Nothing further needed.

## 2023-06-23 NOTE — Telephone Encounter (Signed)
 PT calling about her Stiolto RX. States that we were supposed to have completed and sent paperwork for it. Please call to advise pt @ [phone].  Pharm is [facility] [address].

## 2023-06-23 NOTE — Telephone Encounter (Signed)
 Copied from CRM [id number]. Topic: Clinical - Lab/Test Results >> Jun 21, 2023  1:58 PM Elizebeth Brooking wrote: Reason for CRM: Patient called in would like for someone to give her a callback regarding imaging results >> Jun 23, 2023 11:33 AM Fonda Kinder J wrote: Pt called in requesting a callback regarding this, she states she was told her results haven't came back yet but they are available on my chart. She wants to go over the results  >> Jun 23, 2023 11:31 AM Fonda Kinder J wrote: PT is requesting a

## 2023-06-23 NOTE — Telephone Encounter (Signed)
 Radiology reading room has been contacted to have X-rays read as soon as possible. LVM for pt to inform.

## 2023-06-26 NOTE — Telephone Encounter (Signed)
 No further action needed.

## 2023-06-28 NOTE — Telephone Encounter (Signed)
 NFN

## 2023-06-30 ENCOUNTER — Ambulatory Visit: Admitting: Family Medicine

## 2023-07-01 ENCOUNTER — Other Ambulatory Visit: Payer: Self-pay | Admitting: Family Medicine

## 2023-07-03 ENCOUNTER — Ambulatory Visit (INDEPENDENT_AMBULATORY_CARE_PROVIDER_SITE_OTHER): Admitting: Family Medicine

## 2023-07-03 ENCOUNTER — Other Ambulatory Visit: Payer: Self-pay | Admitting: Family Medicine

## 2023-07-03 DIAGNOSIS — M25519 Pain in unspecified shoulder: Secondary | ICD-10-CM

## 2023-07-03 NOTE — Progress Notes (Signed)
   Pt canceled < 1 hr prior to appt.

## 2023-07-03 NOTE — Telephone Encounter (Unsigned)
 Copied from CRM [id number]. Topic: Clinical - Medication Refill >> Jul 03, 2023 11:18 AM Alcus Dad wrote: Most Recent Primary Care Visit:  Provider: Jeoffrey Massed  Department: [facility]  Visit Type: OFFICE VISIT  Date: 06/20/2023  Medication: lactulose (CHRONULAC) 10 GM/15ML solution  Has the patient contacted their pharmacy? Yes (Agent: If no, request that the patient contact the pharmacy for the refill. If patient does not wish to contact the pharmacy document the reason why and proceed with request.) (Agent: If yes, when and what did the pharmacy advise?)  Is this the correct pharmacy for this prescription? Yes If no, delete pharmacy and type the correct one.  This is the patient's preferred pharmacy:  [id number] [place], [place] - [address] [place] [address] Phone: [phone] Fax: [phone]  [facility] [address] [place] [address] Phone: [phone] Fax: [phone]   Has the prescription been filled recently? No  Is the patient out of the medication? Yes  Has the patient been seen for an appointment in the last year OR does the patient have an upcoming appointment? Yes  Can we respond through MyChart? Yes  Agent: Please be advised that Rx refills may take up to 3 business days. We ask that you follow-up with your pharmacy.

## 2023-07-04 ENCOUNTER — Telehealth (INDEPENDENT_AMBULATORY_CARE_PROVIDER_SITE_OTHER): Admitting: Family Medicine

## 2023-07-04 ENCOUNTER — Telehealth: Payer: Self-pay

## 2023-07-04 ENCOUNTER — Encounter: Payer: Self-pay | Admitting: Family Medicine

## 2023-07-04 VITALS — BP 183/99

## 2023-07-04 DIAGNOSIS — T402X5A Adverse effect of other opioids, initial encounter: Secondary | ICD-10-CM | POA: Diagnosis not present

## 2023-07-04 DIAGNOSIS — G8929 Other chronic pain: Secondary | ICD-10-CM

## 2023-07-04 DIAGNOSIS — M25512 Pain in left shoulder: Secondary | ICD-10-CM

## 2023-07-04 DIAGNOSIS — I10 Essential (primary) hypertension: Secondary | ICD-10-CM

## 2023-07-04 DIAGNOSIS — K5909 Other constipation: Secondary | ICD-10-CM | POA: Diagnosis not present

## 2023-07-04 DIAGNOSIS — K5903 Drug induced constipation: Secondary | ICD-10-CM

## 2023-07-04 DIAGNOSIS — S40012A Contusion of left shoulder, initial encounter: Secondary | ICD-10-CM | POA: Diagnosis not present

## 2023-07-04 MED ORDER — AMLODIPINE BESYLATE 10 MG PO TABS: 10.0000 mg | ORAL_TABLET | Freq: Every day | ORAL | 0 refills | Status: DC

## 2023-07-04 MED ORDER — LOSARTAN POTASSIUM 50 MG PO TABS: 50.0000 mg | ORAL_TABLET | Freq: Every day | ORAL | Status: DC

## 2023-07-04 MED ORDER — LACTULOSE 10 GM/15ML PO SOLN: 20.0000 g | Freq: Every day | ORAL | 3 refills | Status: DC

## 2023-07-04 MED ORDER — NALOXEGOL OXALATE 25 MG PO TABS
25.0000 mg | ORAL_TABLET | Freq: Every day | ORAL | 5 refills | Status: DC
Start: 2023-07-04 — End: 2023-08-25

## 2023-07-04 NOTE — Telephone Encounter (Signed)
 Erin Lopez called pt and set her up on my schedule today.

## 2023-07-04 NOTE — Progress Notes (Signed)
 Virtual Visit via Video Note  I connected with Erin Lopez  on 07/04/23 at 10:40 AM EDT by a video enabled telemedicine application and verified that I am speaking with the correct person using two identifiers.  Location patient: [place] Location provider:work or home office Persons participating in the virtual visit: patient, provider  I discussed the limitations and requested verbal permission for telemedicine visit. The patient expressed understanding and agreed to proceed.   HPI: 73 year old female being seen today for uncontrolled hypertension and ongoing severe chronic constipation and left shoulder pain. Has had shoulder pain since falling in October 2024.  Worsening the last couple of weeks. Takes 2 Vicodin twice a day for chronic neck pain.  Home blood pressures typically in the 150s to 160s systolic last couple of weeks.  Last couple of days it has been sometimes up into the 180s to 190s. No dizziness, headache, or vision concerns.  After last visit she was able to get some stool evacuated and is currently taking 1 capful of MiraLAX a day and 1 tablespoon of lactulose a day.  Still having hard and infrequent bowel movements. No blood in stool, no melena. If she takes more than a capful of MiraLAX a day she gets loose stools.   ROS: See pertinent positives and negatives per HPI.  Past Medical History:  Diagnosis Date   Acute encephalopathy 11/19/2017   Alcoholism in remission [facility])    states last alcohol 11 yrs ago   Anemia    Anxiety    Bipolar 1 disorder (HCC)    Admission for mania x 2 (most recent 11/2017)   Cervical spondylosis without myelopathy 12/19/2013   MRI 02/2014: multilevel DDD/spondylosis, not much change compared to prior MRI.  Pt not in favor of invasive therapy for her neck as of 04/2014.   Chronic constipation    Chronic renal insufficiency, stage 3 (moderate) (HCC)    COPD (chronic obstructive pulmonary disease) (HCC)    Moderate: anoro started 04/2017 by pulm,  pt symptomatically improved and PFTs stable at f/u 06/2017.   Coronary artery spasm (HCC) 07/11/2018   nitrates=HA. Amlodipine=intol swelling.  Changed to coreg 09/03/18 by cardiologist.   Depression    Endometritis 05/2017   possible; empiric tx with Flagyl by GYN.   Femoral neck fracture (HCC)    right, 01/28/23-->ORIF   Fibromyalgia    GERD (gastroesophageal reflux disease)    Hepatic steatosis 03/2019   u/s abd   Herpes zoster 08/07/2017   L scapular region   Hiatal hernia    History of adenomatous polyp of colon 04/2008; 08/2014   No high grade dysplasia: recall 5 yrs (Dr. Bosie Clos, [facility])   History of basal cell carcinoma excision    NOSE   History of Helicobacter pylori infection 04/2008   +gastric biopsy (gastritis but no metaplasia, dysplasia, or malignancy identified)   History of kidney stones    History of TIA (transient ischemic attack)    secondary to HELLP syndrome 1994 post c/s--  residual memory loss   Hyperlipidemia    statin started after her NSTEMI: goal  LDL <70.   Hypertension    Ischemic cardiomyopathy 05/2018   Echo EF 45-50% in context of NSTEMI from CA spasm. 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 Charlsie Merles 04/2017.  Memory loss    Abnl MRI brain and CT brain c/w chronic microvascular ischemia.  Repeat MRI brain 02/2014 stable (Dr. Radonna Ricker with no plan to f/u with neuro as of 04/2014)   NSTEMI (non-ST elevated myocardial infarction) (HCC) 05/2018   Echo EF 45-50% in context of NSTEMI from CA spasm.// Myoview 8/21: EF 66, no ischemia, low risk   Osteoarthritis of left wrist    STT and 1st [facility] jt   Osteoporosis 05/2010   2012 'penia; 06/2015 'porosis:  prolia.  DEXA 09/2017 T-score -2.2 femur neck. 11/2021 T score  -2.2. Rpt 2 yrs   Pelvic floor dysfunction    [facility] (Dr. Marlou Porch)   Postmenopausal vaginal bleeding 05/2017   x 1 small episode; GYN attempted endo bx but unable to penetrate cervix due to severe cerv stenosis (due to postmenopausal state + hx of LEEP).  Endo u/s showed thin endo lining.  Per GYN---no evidence of endomet pathology on w/u---obs as of 07/2017.   Prediabetes 06/2016   Fasting gluc 105; HbA1c at that time was 6.0%.  A1c 6.2% 12/2017.  A1c 5.7% Feb 2023.   Recurrent kidney stones 08/2013   Left ureteral calculus: Perc nephr--cystoscopy w/ureteroscopy + laser for stone removal.  Residual asymptomatic left renal nephrolithiasis <27mm present post-procedure.  Right sided hydronephrosis-persistent (on u/s)--urol ordered CT to further eval 08/2016: 1.6 cm nonobstructing stone lower pole left kidney, no hydro, plan for PCN extraction ([facility])   Sepsis (HCC)    UTI   Suprapubic discomfort 06/05/2015   TMJ (temporomandibular joint disorder)    USES  MOUTH GUARD   Toe fracture, left 12/2017   2nd toe prox phalanx (immobilization, Dr. Luciana Axe)   Vitamin D deficiency 05/2011    Past Surgical History:  Procedure Laterality Date   ABIs  11/29/2019   NORMAL   ANTERIOR CERVICAL DECOMP/DISCECTOMY FUSION  06/30/2009   C3 -- C5  AND EXPLORATION OF FUSION C5-7 W/  PLATE REMOVAL   ARTHRODESIS METATARSALPHALANGEAL JOINT (MTPJ) Left 07/08/2021   Procedure: Left hallux metatarsophalangeal joint arthrodesis;  Surgeon: Toni Arthurs, MD;  Location: [facility];  Service: Orthopedics;  Laterality: Left;   BACK SURGERY     BUNIONECTOMY Left    CARDIOVASCULAR STRESS TEST  12/03/2019   myoc perf im: NORMAL   CERVICAL FUSION  2002   anterior C5 -- C7   CERVICAL SPINE SURGERY  08/27/1999     C6 -- T1  LAMINECTOMY/  DISKECTOMY   CESAREAN SECTION  1994   CHOLECYSTECTOMY N/A 03/06/2020   Procedure: LAPAROSCOPIC CHOLECYSTECTOMY;  Surgeon: Quentin Ore, MD;  Location: [facility];   Service: General;  Laterality: N/A;   COLONOSCOPY W/ POLYPECTOMY  04/2008;08/2014   2016 tubular adenoma x 1; +diverticulosis and int/ext hemorrhoids.  06/2020 adenoma, recall 5 yrs.   CYSTOSCOPY WITH URETEROSCOPY AND STENT PLACEMENT Left 08/28/2013   Procedure: CYSTOSCOPY, RGP,  WITH URETEROSCOPY AND STENT REMOVAL;  Surgeon: Valetta Fuller, MD;  Location: [facility];  Service: Urology;  Laterality: Left;   DEXA  09/2017   T-score -2.2 femur neck: improved compared to 06/2015.  2021 DEXA T score -2.3.  11/2021 DEXA T score -2.2.   ESOPHAGOGASTRODUODENOSCOPY  2010; 08/2014   2016 +Candidal esophagitis; Mild chronic gastritis w/intestinal metaplasia--NEG H pylori, neg for eosinophilic esoph. 06/2020 esoph candidiasis (diflucan x 20d), o/w normal.   HAMMER TOE SURGERY Left 07/08/2021   Procedure: Second metatarsal head excision; revision Second hammertoe correction; 2-5 percutaneus flexor tenotomies;  Surgeon: Victorino Dike,  Jonny Ruiz, MD;  Location: [facility];  Service: Orthopedics;  Laterality: Left;   HARDWARE REMOVAL Left 07/08/2021   Procedure: Removal of deep implants from hallux proximal phalanx and First and Second metatarsals;  Surgeon: Toni Arthurs, MD;  Location: [facility];  Service: Orthopedics;  Laterality: Left;   HIP PINNING,CANNULATED Right 01/28/2023   Procedure: PERCUTANEOUS FIXATION OF RIGHT FEMORAL NECK;  Surgeon: Yolonda Kida, MD;  Location: [facility] OR;  Service: Orthopedics;  Laterality: Right;   HOLMIUM LASER APPLICATION Left 08/28/2013   Procedure: HOLMIUM LASER APPLICATION;  Surgeon: Valetta Fuller, MD;  Location: [facility];  Service: Urology;  Laterality: Left;   LEFT HEART CATH AND CORONARY ANGIOGRAPHY N/A 05/15/2018   Evidence for spasm in distal LAD.  Mod RCA atherosclerosis, o/w no CAD.  Procedure: LEFT HEART CATH AND CORONARY ANGIOGRAPHY;  Surgeon: Marykay Lex, MD;  [facility];  Service:  Cardiovascular;  Laterality: N/A;   NEGATIVE SLEEP STUDY  04/01/2007   NEPHROLITHOTOMY Left 08/05/2013   Procedure: NEPHROLITHOTOMY PERCUTANEOUS;  Surgeon: Valetta Fuller, MD;  Location: [facility];  Service: Urology;  Laterality: Left;   POLYSOMNOGRAM  01/2019   NORMAL   POLYSOMNOGRAPHY  10/25/2018   Dr. Luetta Nutting due to pt inadequat sleep time (83 min).   TONSILLECTOMY AND ADENOIDECTOMY  1975   TOTAL KNEE ARTHROPLASTY Right 2003   TOTAL KNEE REVISION Right 01/19/2022   Procedure: Right knee polyethylene revision;  Surgeon: Ollen Gross, MD;  Location: [facility];  Service: Orthopedics;  Laterality: Right;   TRANSTHORACIC ECHOCARDIOGRAM  05/2018; 12/03/2019   (after MI secondary to arterial spasm): EF 45-50%; severe hypokinesis of apical anterolateral, inferolateral , anterior, and inferior LV myocardium. 11/2019 EF 55-60%, valves fine   ULTRASOUND EXAM,PELVIC COMPLETE ([facility] HX)  06/25/2015   NORMAL (done by GYN)     Current Outpatient Medications:    albuterol (VENTOLIN HFA) 108 (90 Base) MCG/ACT inhaler, TAKE 2 PUFFS BY MOUTH EVERY 6 HOURS AS NEEDED FOR WHEEZE OR SHORTNESS OF BREATH, Disp: 18 each, Rfl: 1   ALPRAZolam (XANAX) 0.5 MG tablet, TAKE 1 TABLET BY MOUTH THREE TIMES A DAY AS NEEDED FOR ANXIETY, Disp: 90 tablet, Rfl: 5   atorvastatin (LIPITOR) 80 MG tablet, TAKE 1 TABLET BY MOUTH DAILY. (Patient taking differently: Take 80 mg by mouth every evening.), Disp: 90 tablet, Rfl: 3   buPROPion (WELLBUTRIN XL) 150 MG 24 hr tablet, Take 150 mg by mouth daily., Disp: , Rfl:    citalopram (CELEXA) 20 MG tablet, Take 1 tablet (20 mg total) by mouth in the morning and at bedtime., Disp: , Rfl:    famotidine (PEPCID) 40 MG tablet, Take 1 tablet (40 mg total) by mouth daily., Disp: 90 tablet, Rfl: 3   fluticasone (FLONASE) 50 MCG/ACT nasal spray, SPRAY 2 SPRAYS INTO EACH NOSTRIL EVERY DAY, Disp: 48 mL, Rfl: 1   hydrochlorothiazide (HYDRODIURIL) 12.5 MG tablet, 2 tabs po qd, Disp: ,  Rfl:    HYDROcodone-acetaminophen (NORCO/VICODIN) 5-325 MG tablet, 1-2 tab po bid as needed for pain, Disp: 90 tablet, Rfl: 0   KLOR-CON M20 20 MEQ tablet, TAKE 1 TABLET BY MOUTH EVERY DAY, Disp: 90 tablet, Rfl: 1   lactulose (CHRONULAC) 10 GM/15ML solution, Take 30 mLs (20 g total) by mouth 3 (three) times daily., Disp: 236 mL, Rfl: 0   lamoTRIgine (LAMICTAL) 100 MG tablet, TAKE 3 TABLETS (300 MG TOTAL) BY MOUTH AT BEDTIME, Disp: 270 tablet, Rfl: 2  loratadine (CLARITIN) 10 MG tablet, Take 10 mg by mouth daily., Disp: , Rfl:    losartan (COZAAR) 50 MG tablet, TAKE 1 TABLET BY MOUTH EVERY DAY, Disp: 30 tablet, Rfl: 1   meloxicam (MOBIC) 15 MG tablet, TAKE 1 TABLET BY MOUTH ONCE DAILY AS NEEDED FOR JOINT PAIN, Disp: 30 tablet, Rfl: 2   metoprolol succinate (TOPROL-XL) 100 MG 24 hr tablet, 1 and 1/2 tabs p.o. daily.  Take with or immediately following a meal., Disp: , Rfl:    montelukast (SINGULAIR) 10 MG tablet, TAKE 1 TABLET BY MOUTH EVERY DAY IN THE EVENING, Disp: 90 tablet, Rfl: 1   nitroGLYCERIN (NITROSTAT) 0.4 MG SL tablet, PLACE 1 TABLET (0.4 MG TOTAL) UNDER THE TONGUE EVERY 5 (FIVE) MINUTES X 3 DOSES AS NEEDED FOR CHEST PAIN., Disp: 25 tablet, Rfl: 1   pantoprazole (PROTONIX) 40 MG tablet, TAKE 1 TABLET (40 MG TOTAL) BY MOUTH DAILY. KEEP F/U APPT FOR ANY FURTHER REFILLS, Disp: 90 tablet, Rfl: 1   Tiotropium Bromide-Olodaterol (STIOLTO RESPIMAT) 2.5-2.5 MCG/ACT AERS, INHALE 2 EACH INTO THE LUNGS DAILY., Disp: 4 g, Rfl: 0   zolpidem (AMBIEN) 10 MG tablet, TAKE 1 TABLET BY MOUTH EVERYDAY AT BEDTIME, Disp: 90 tablet, Rfl: 1   amoxicillin (AMOXIL) 500 MG capsule, Take 2,000 mg by mouth daily as needed (1 hour prior to dental work). (Patient not taking: Reported on 06/09/2023), Disp: , Rfl:    ondansetron (ZOFRAN) 4 MG tablet, Take 1 tablet (4 mg total) by mouth every 8 (eight) hours as needed for nausea or vomiting. (Patient not taking: Reported on 07/04/2023), Disp: 20 tablet, Rfl: 0  EXAM:  VITALS  per patient if applicable:     07/04/2023   10:22 AM 06/20/2023   11:02 AM 06/09/2023   11:25 AM  Vitals with BMI  Height  5\' 7"    Weight  177 lbs 6 oz   BMI  27.78   Systolic 183 164 098  Diastolic 99 84 78  Pulse  65 69    GENERAL: alert, oriented, appears well and in no acute distress  HEENT: atraumatic, conjunttiva clear, no obvious abnormalities on inspection of external nose and ears  NECK: normal movements of the head and neck  LUNGS: on inspection no signs of respiratory distress, breathing rate appears normal, no obvious gross SOB, gasping or wheezing  CV: no obvious cyanosis  MS: moves all visible extremities without noticeable abnormality  PSYCH/NEURO: pleasant and cooperative, no obvious depression or anxiety, speech and thought processing grossly intact  LABS: none today    Chemistry      Component Value Date/Time   NA 137 06/09/2023 1159   NA 135 12/03/2019 1103   K 4.1 06/09/2023 1159   CL 99 06/09/2023 1159   CO2 30 06/09/2023 1159   BUN 19 06/09/2023 1159   BUN 8 12/03/2019 1103   CREATININE 1.02 (H) 06/09/2023 1159      Component Value Date/Time   CALCIUM 10.1 06/09/2023 1159   ALKPHOS 66 05/12/2023 1325   AST 20 05/12/2023 1325   ALT 28 05/12/2023 1325   BILITOT 0.8 05/12/2023 1325   BILITOT 0.7 09/25/2018 1007     ASSESSMENT AND PLAN:  Discussed the following assessment and plan:  #1 left shoulder pain.  She had a contusion sustained in a fall October 2024. There was some confusion in the shoulder did not get imaged at that time. Will order left shoulder radiograph today. Will have her come in for exam and ultrasound evaluation  here at her earliest convenience.  She has an appointment next week but will try to get her in before that.  2.  Uncontrolled hypertension.  Acute worsening with uncontrolled pain. Start amlodipine 10 mg a day.  Continue HCTZ 25 mg a day, losartan 100 mg a day, and Toprol-XL 150 mg a day.  #3 chronic  constipation. 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 Add movantik 25mg  every day. Cont miralax 1 capful daily and lactulose 1 tablespoon daily.   I discussed the assessment and treatment plan with the patient. The patient was provided an opportunity to ask questions and all were answered. The patient agreed with the plan and demonstrated an understanding of the instructions.   Signed:  Santiago Bumpers, MD           07/04/2023
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
## 2023-07-05 ENCOUNTER — Ambulatory Visit: Admitting: Urgent Care

## 2023-07-07 ENCOUNTER — Ambulatory Visit: Payer: Medicare HMO | Admitting: Primary Care

## 2023-07-07 ENCOUNTER — Ambulatory Visit (INDEPENDENT_AMBULATORY_CARE_PROVIDER_SITE_OTHER): Admitting: Family Medicine

## 2023-07-07 ENCOUNTER — Encounter: Payer: Self-pay | Admitting: Family Medicine

## 2023-07-07 ENCOUNTER — Encounter: Payer: Self-pay | Admitting: Primary Care

## 2023-07-07 VITALS — BP 153/84 | HR 66 | Temp 97.2°F | Ht 68.0 in | Wt 177.2 lb

## 2023-07-07 VITALS — BP 150/64 | HR 75 | Ht 68.0 in | Wt 177.4 lb

## 2023-07-07 DIAGNOSIS — G8929 Other chronic pain: Secondary | ICD-10-CM

## 2023-07-07 DIAGNOSIS — I10 Essential (primary) hypertension: Secondary | ICD-10-CM | POA: Diagnosis not present

## 2023-07-07 DIAGNOSIS — K219 Gastro-esophageal reflux disease without esophagitis: Secondary | ICD-10-CM | POA: Diagnosis not present

## 2023-07-07 DIAGNOSIS — M7542 Impingement syndrome of left shoulder: Secondary | ICD-10-CM

## 2023-07-07 DIAGNOSIS — J449 Chronic obstructive pulmonary disease, unspecified: Secondary | ICD-10-CM | POA: Diagnosis not present

## 2023-07-07 DIAGNOSIS — M25512 Pain in left shoulder: Secondary | ICD-10-CM | POA: Diagnosis not present

## 2023-07-07 MED ORDER — TRIAMCINOLONE ACETONIDE 40 MG/ML IJ SUSP
40.0000 mg | Freq: Once | INTRAMUSCULAR | Status: AC
  Administered 2023-07-07: 40 mg via INTRA_ARTICULAR

## 2023-07-07 MED ORDER — AMLODIPINE BESYLATE 10 MG PO TABS: 10.0000 mg | ORAL_TABLET | Freq: Every day | ORAL | 0 refills | Status: DC

## 2023-07-07 MED ORDER — STIOLTO RESPIMAT 2.5-2.5 MCG/ACT IN AERS: 2.0000 | INHALATION_SPRAY | Freq: Every day | RESPIRATORY_TRACT | 5 refills | Status: DC

## 2023-07-07 NOTE — Progress Notes (Signed)
 @Patient  ID: [name], female    DOB: [birth year], 73 y.o.   MRN: [id number]  Chief Complaint  Patient presents with   Follow-up    F/U on COPD     Referring provider: Shelvia Dick, MD  HPI: 73 year old former smoker. PMH significant for NSTMI, cardiomyopathy, COPD, HTN, osteoporosis, CKD, bipolar 1 disorder, hyperlipidemia, prolonged QT interval, hx TIA.   07/07/2023 Discussed the use of AI scribe software for clinical note transcription with the patient, who gave verbal consent to proceed.  History of Present Illness   The patient, with COPD, presents for management and follow-up.  She has a history of COPD and has been followed intermittently since 2017. She was last seen in December 2023 and was noted to be doing well at that time. She is currently using Stiolto Respimat daily, which she finds effective, although it does not last as long as it used to. She uses a rescue inhaler once or twice daily, depending on her activity level. She experiences daily shortness of breath, which has worsened, and is triggered by minimal exertion such as getting out of bed. She also reports wheezing.  She has a history of emphysema, identified through imaging. She is a former smoker and has not smoked since quitting.  She has a history of acid reflux and is currently taking Protonix daily in the morning and Pepcid at bedtime. She has a history of a hiatal hernia and tries to avoid foods that exacerbate her reflux, such as acidic foods, caffeine, and chocolate.  She has a history of high blood pressure, cardiomyopathy, a past myocardial infarction with stent placement, osteoporosis, cervical issues, joint problems, and kidney disease. Her blood pressure has been elevated. She mentions weight fluctuations, believing she was losing weight but recently noted an increase.      Allergies  Allergen Reactions   Ceftin [Cefuroxime Axetil] Anaphylaxis and Swelling    Swelling of eyes and tongue    Cipro [Ciprofloxacin Hcl] Anaphylaxis and Swelling   Risperdal [Risperidone] Palpitations    Generalized weakness Malaise  Hypertension   Fosamax [Alendronate Sodium] Diarrhea   Linzess [Linaclotide] Diarrhea   Morphine And Codeine Other (See Comments)    Hallucinations Disorientation    Prednisone Other (See Comments)    Hyperactivity   Roxicodone [Oxycodone] Other (See Comments)    Previously addicted to medication   Seroquel [Quetiapine] Other (See Comments)    Oversedation     Immunization History  Administered Date(s) Administered   Influenza Split 01/03/2015   Influenza, High Dose Seasonal PF 02/01/2018, 01/23/2019, 12/26/2019, 11/29/2022   Influenza,inj,Quad PF,6+ Mos 12/21/2016   Influenza-Unspecified 01/17/2014, 01/03/2016   PFIZER(Purple Top)SARS-COV-2 Vaccination 06/04/2019, 07/12/2019   PNEUMOCOCCAL CONJUGATE-20 09/17/2021   Pneumococcal Conjugate-13 03/30/2018   Pneumococcal Polysaccharide-23 02/26/2008, 08/07/2014   Td 04/05/2003, 10/15/2021   Tdap 08/07/2014   Zoster Recombinant(Shingrix) 05/28/2018, 07/17/2018    Past Medical History:  Diagnosis Date   Acute encephalopathy 11/19/2017   Alcoholism in remission (HCC)    states last alcohol 11 yrs ago   Anemia    Anxiety    Bipolar 1 disorder (HCC)    Admission for mania x 2 (most recent 11/2017)   Cervical spondylosis without myelopathy 12/19/2013   MRI 02/2014: multilevel DDD/spondylosis, not much change compared to prior MRI.  Pt not in favor of invasive therapy for her neck as of 04/2014.   Chronic constipation    Chronic renal insufficiency, stage 3 (moderate) (HCC)    COPD (chronic  obstructive pulmonary disease) (HCC)    Moderate: anoro started 04/2017 by pulm, pt symptomatically improved and PFTs stable at f/u 06/2017.   Coronary artery spasm (HCC) 07/11/2018   nitrates=HA. Amlodipine=intol swelling.  Changed to coreg 09/03/18 by cardiologist.   Depression    Endometritis 05/2017   possible;  empiric tx with Flagyl by GYN.   Femoral neck fracture (HCC)    right, 01/28/23-->ORIF   Fibromyalgia    GERD (gastroesophageal reflux disease)    Hepatic steatosis 03/2019   u/s abd   Herpes zoster 08/07/2017   L scapular region   Hiatal hernia    History of adenomatous polyp of colon 04/2008; 08/2014   No high grade dysplasia: recall 5 yrs (Dr. Honey Lusty, [facility])   History of basal cell carcinoma excision    NOSE   History of Helicobacter pylori infection 04/2008   +gastric biopsy (gastritis but no metaplasia, dysplasia, or malignancy identified)   History of kidney stones    History of TIA (transient ischemic attack)    secondary to HELLP syndrome 1994 post c/s--  residual memory loss   Hyperlipidemia    statin started after her NSTEMI: goal  LDL <70.   Hypertension    Ischemic cardiomyopathy 05/2018   Echo EF 45-50% in context of NSTEMI from CA spasm. 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 [name] 04/2017.   Memory loss    Abnl MRI brain and CT brain c/w chronic microvascular ischemia.  Repeat MRI brain 02/2014 stable (Dr. Levell Reach with no plan to f/u with neuro as of 04/2014)   NSTEMI (non-ST elevated myocardial infarction) (HCC) 05/2018   Echo EF 45-50% in context of NSTEMI from CA spasm.// Myoview 8/21: EF 66, no ischemia, low risk   Osteoarthritis of left wrist    STT and 1st [facility] jt   Osteoporosis 05/2010   2012 'penia; 06/2015 'porosis:  prolia.  DEXA 09/2017 T-score -2.2 femur neck. 11/2021 T score -2.2. Rpt 2 yrs   Pelvic floor dysfunction    [facility] (Dr. Dulcy Gibney)   Postmenopausal vaginal bleeding 05/2017   x 1 small episode; GYN attempted endo bx but unable to penetrate cervix due to severe cerv stenosis (due to  postmenopausal state + hx of LEEP).  Endo u/s showed thin endo lining.  Per GYN---no evidence of endomet pathology on w/u---obs as of 07/2017.   Prediabetes 06/2016   Fasting gluc 105; HbA1c at that time was 6.0%.  A1c 6.2% 12/2017.  A1c 5.7% Feb 2023.   Recurrent kidney stones 08/2013   Left ureteral calculus: Perc nephr--cystoscopy w/ureteroscopy + laser for stone removal.  Residual asymptomatic left renal nephrolithiasis <77mm present post-procedure.  Right sided hydronephrosis-persistent (on u/s)--urol ordered CT to further eval 08/2016: 1.6 cm nonobstructing stone lower pole left kidney, no hydro, plan for PCN extraction ([facility])   Sepsis (HCC)    UTI   Suprapubic discomfort 06/05/2015   TMJ (temporomandibular joint disorder)    USES  MOUTH GUARD   Toe fracture, left 12/2017   2nd toe prox phalanx (immobilization, Dr. Seward Dao)   Vitamin D deficiency 05/2011    Tobacco History: Social History   Tobacco Use  Smoking Status Former   Current packs/day: 0.00   Average packs/day: 1 pack/day for 33.0 years (33.0 ttl pk-yrs)  Types: Cigarettes   Start date: 08/22/1960   Quit date: 08/22/1993   Years since quitting: 29.8  Smokeless Tobacco Never   Counseling given: Not Answered   Outpatient Medications Prior to Visit  Medication Sig Dispense Refill   albuterol (VENTOLIN HFA) 108 (90 Base) MCG/ACT inhaler TAKE 2 PUFFS BY MOUTH EVERY 6 HOURS AS NEEDED FOR WHEEZE OR SHORTNESS OF BREATH 18 each 1   ALPRAZolam (XANAX) 0.5 MG tablet TAKE 1 TABLET BY MOUTH THREE TIMES A DAY AS NEEDED FOR ANXIETY 90 tablet 5   amLODipine (NORVASC) 10 MG tablet Take 1 tablet (10 mg total) by mouth daily. 30 tablet 0   atorvastatin (LIPITOR) 80 MG tablet TAKE 1 TABLET BY MOUTH DAILY. (Patient taking differently: Take 80 mg by mouth every evening.) 90 tablet 3   buPROPion (WELLBUTRIN XL) 150 MG 24 hr tablet Take 150 mg by mouth daily.     citalopram (CELEXA) 20 MG tablet Take 1 tablet (20 mg total) by mouth in the  morning and at bedtime.     famotidine (PEPCID) 40 MG tablet Take 1 tablet (40 mg total) by mouth daily. 90 tablet 3   fluticasone (FLONASE) 50 MCG/ACT nasal spray SPRAY 2 SPRAYS INTO EACH NOSTRIL EVERY DAY 48 mL 1   hydrochlorothiazide (HYDRODIURIL) 12.5 MG tablet 2 tabs po qd     HYDROcodone-acetaminophen (NORCO/VICODIN) 5-325 MG tablet 1-2 tab po bid as needed for pain 90 tablet 0   KLOR-CON M20 20 MEQ tablet TAKE 1 TABLET BY MOUTH EVERY DAY 90 tablet 1   lactulose (CHRONULAC) 10 GM/15ML solution Take 30 mLs (20 g total) by mouth daily. 236 mL 3   lamoTRIgine (LAMICTAL) 100 MG tablet TAKE 3 TABLETS (300 MG TOTAL) BY MOUTH AT BEDTIME 270 tablet 2   loratadine (CLARITIN) 10 MG tablet Take 10 mg by mouth daily.     losartan (COZAAR) 50 MG tablet Take 1 tablet (50 mg total) by mouth daily.     meloxicam (MOBIC) 15 MG tablet TAKE 1 TABLET BY MOUTH ONCE DAILY AS NEEDED FOR JOINT PAIN 30 tablet 2   metoprolol succinate (TOPROL-XL) 100 MG 24 hr tablet 1 and 1/2 tabs p.o. daily.  Take with or immediately following a meal.     montelukast (SINGULAIR) 10 MG tablet TAKE 1 TABLET BY MOUTH EVERY DAY IN THE EVENING 90 tablet 1   naloxegol oxalate (MOVANTIK) 25 MG TABS tablet Take 1 tablet (25 mg total) by mouth daily. 30 tablet 5   nitroGLYCERIN (NITROSTAT) 0.4 MG SL tablet PLACE 1 TABLET (0.4 MG TOTAL) UNDER THE TONGUE EVERY 5 (FIVE) MINUTES X 3 DOSES AS NEEDED FOR CHEST PAIN. 25 tablet 1   pantoprazole (PROTONIX) 40 MG tablet TAKE 1 TABLET (40 MG TOTAL) BY MOUTH DAILY. KEEP F/U APPT FOR ANY FURTHER REFILLS 90 tablet 1   Tiotropium Bromide-Olodaterol (STIOLTO RESPIMAT) 2.5-2.5 MCG/ACT AERS INHALE 2 EACH INTO THE LUNGS DAILY. 4 g 0   zolpidem (AMBIEN) 10 MG tablet TAKE 1 TABLET BY MOUTH EVERYDAY AT BEDTIME 90 tablet 1   amoxicillin (AMOXIL) 500 MG capsule Take 2,000 mg by mouth daily as needed (1 hour prior to dental work). (Patient not taking: Reported on 06/09/2023)     ondansetron (ZOFRAN) 4 MG tablet Take 1  tablet (4 mg total) by mouth every 8 (eight) hours as needed for nausea or vomiting. (Patient not taking: Reported on 06/02/2023) 20 tablet 0   No facility-administered medications prior to visit.    Review of Systems  Review of Systems  Constitutional: Negative.   HENT: Negative.    Respiratory:  Positive for shortness of breath and wheezing.   Cardiovascular: Negative.      Physical Exam  BP (!) 153/84 (BP Location: Right Arm, Patient Position: Sitting, Cuff Size: Large)   Pulse 66   Temp (!) 97.2 F (36.2 C) (Temporal)   Ht 5\' 8"  (1.727 m)   Wt 177 lb 3.2 oz (80.4 kg)   SpO2 95%   BMI 26.94 kg/m  Physical Exam Constitutional:      General: She is not in acute distress.    Appearance: Normal appearance. She is not ill-appearing.  HENT:     Head: Normocephalic and atraumatic.     Mouth/Throat:     Mouth: Mucous membranes are moist.     Pharynx: Oropharynx is clear.  Cardiovascular:     Rate and Rhythm: Normal rate and regular rhythm.  Pulmonary:     Effort: Pulmonary effort is normal.     Breath sounds: Normal breath sounds.  Neurological:     General: No focal deficit present.     Mental Status: She is alert and oriented to person, place, and time. Mental status is at baseline.  Psychiatric:        Mood and Affect: Mood normal.        Behavior: Behavior normal.        Thought Content: Thought content normal.        Judgment: Judgment normal.      Lab Results:  CBC    Component Value Date/Time   WBC 7.4 05/28/2023 2030   RBC 3.61 (L) 05/28/2023 2030   HGB 11.2 (L) 05/28/2023 2030   HCT 32.3 (L) 05/28/2023 2030   PLT 248 05/28/2023 2030   MCV 89.5 05/28/2023 2030   MCH 31.0 05/28/2023 2030   MCHC 34.7 05/28/2023 2030   RDW 12.2 05/28/2023 2030   LYMPHSABS 1.9 05/28/2023 2030   MONOABS 0.6 05/28/2023 2030   EOSABS 0.6 (H) 05/28/2023 2030   BASOSABS 0.1 05/28/2023 2030    BMET    Component Value Date/Time   NA 137 06/09/2023 1159   NA 135  12/03/2019 1103   K 4.1 06/09/2023 1159   CL 99 06/09/2023 1159   CO2 30 06/09/2023 1159   GLUCOSE 90 06/09/2023 1159   BUN 19 06/09/2023 1159   BUN 8 12/03/2019 1103   CREATININE 1.02 (H) 06/09/2023 1159   CALCIUM 10.1 06/09/2023 1159   GFRNONAA 58 (L) 05/28/2023 2030   GFRAA 80 12/03/2019 1103    BNP    Component Value Date/Time   BNP 19.4 11/22/2021 1854    ProBNP    Component Value Date/Time   PROBNP 89 10/21/2019 1100   PROBNP 70.3 05/15/2013 2021    Imaging: DG Abd 1 View Result Date: 06/23/2023 CLINICAL DATA:  Constipation. EXAM: ABDOMEN - 1 VIEW COMPARISON:  June 26, 2019 FINDINGS: The bowel gas pattern is normal. A large stool burden is noted throughout the colon. Radiopaque surgical clips are seen overlying the right upper quadrant. No radio-opaque calculi or other significant radiographic abnormality are seen. Radiopaque surgical screws are seen within the proximal right femur. IMPRESSION: Large stool burden without evidence of bowel obstruction. Electronically Signed   By: Virgle Grime M.D.   On: 06/23/2023 20:59   DG Chest 2 View Result Date: 06/23/2023 CLINICAL DATA:  Shortness of breath on exertion with dry cough x3 months. EXAM: CHEST - 2 VIEW COMPARISON:  January 28, 2023 FINDINGS: The heart size and mediastinal contours are within normal limits. Both lungs are clear. Radiopaque surgical clips are seen within the right upper quadrant. Postoperative changes are noted within the visualized portion of the lower cervical spine. The visualized skeletal structures are unremarkable. IMPRESSION: No active cardiopulmonary disease. Electronically Signed   By: Virgle Grime M.D.   On: 06/23/2023 20:58     Assessment & Plan:   1. Chronic obstructive pulmonary disease, unspecified COPD type (HCC) (Primary) - Pulmonary Function Test; Future - AMB referral to pulmonary rehabilitation  2. Gastroesophageal reflux disease, unspecified whether esophagitis  present  Assessment and Plan    Chronic Obstructive Pulmonary Disease (COPD) with Emphysema COPD with emphysema managed with Stiolto, effective but with reduced duration. Daily dyspnea, exacerbated by exertion. Former smoker. Stage 2 obstructive lung disease in 2019. Advised on COPD vs emphysema. Recommended pulmonary rehabilitation and repeat spirometry. - Continue Stiolto inhaler. - Order pulmonary rehabilitation at [facility] location if possible. - Repeat spirometry to assess lung function.  Gastroesophageal Reflux Disease (GERD) with Hiatal Hernia GERD with hiatal hernia managed with Protonix and Pepcid. Dietary modifications discussed. Under care of gastroenterologist Dr. Jonas Neigh. - Continue Protonix in the morning before the first meal. - Continue Pepcid at bedtime. - Advise dietary modifications to avoid exacerbating reflux.  Follow-up: 3 months with Dr. Waylan Haggard or Jerlene Moody NP  Please schedule PFT 1 hour - first available   Antonio Baumgarten, NP 07/07/2023

## 2023-07-07 NOTE — Progress Notes (Signed)
 OFFICE VISIT  07/31/2023  CC:  Chief Complaint  Patient presents with   Shoulder Pain    left    Patient is a 73 y.o. female who presents for 3-day follow-up left shoulder pain and uncontrolled hypertension. A/P as of last visit: "#1 left shoulder pain.  She had a contusion sustained in a fall October 2024. There was some confusion in the shoulder did not get imaged at that time. Will order left shoulder radiograph today. Will have her come in for exam and ultrasound evaluation here at her earliest convenience.  She has an appointment next week but will try to get her in before that.   2.  Uncontrolled hypertension.  Acute worsening with uncontrolled pain. Start amlodipine  10 mg a day.  Continue HCTZ 25 mg a day, losartan  100 mg a day, and Toprol -XL 150 mg a day.   #3 chronic constipation. 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 Add movantik  25mg  every day. Cont miralax  1 capful daily and lactulose  1 tablespoon daily."  INTERIM HX: She has not gotten her shoulder x-ray yet. Her left shoulder feels better today because she has rested the last 3 days or so.  However, it still gives her discomfort almost all the time, particularly with reaching up and out.  It appears I did not send in amlodipine  as I had intended last visit.   Past Medical History:  Diagnosis Date   Acute encephalopathy 11/19/2017   Alcoholism in remission [facility])    states last alcohol 11 yrs ago   Anemia    Anxiety    Bipolar 1 disorder (HCC)    Admission for mania x 2 (most recent 11/2017)   Cervical spondylosis without myelopathy 12/19/2013   MRI 02/2014: multilevel DDD/spondylosis, not much change compared to prior MRI.  Pt not in favor of invasive therapy for her neck as of 04/2014.   Chronic constipation    Chronic renal insufficiency, stage 3 (moderate) (HCC)    COPD (chronic obstructive pulmonary disease) (HCC)    Moderate: anoro started 04/2017 by pulm, pt symptomatically improved and PFTs stable at f/u  06/2017.   Coronary artery spasm (HCC) 07/11/2018   nitrates=HA. Amlodipine =intol swelling.  Changed to coreg  09/03/18 by cardiologist.   Depression    Endometritis 05/2017   possible; empiric tx with Flagyl  by GYN.   Femoral neck fracture (HCC)    right, 01/28/23-->ORIF   Fibromyalgia    GERD (gastroesophageal reflux disease)    Hepatic steatosis 03/2019   u/s abd   Herpes zoster 08/07/2017   L scapular region   Hiatal hernia    History of adenomatous polyp of colon 04/2008; 08/2014   No high grade dysplasia: recall 5 yrs (Dr. Honey Lusty, [facility])   History of basal cell carcinoma excision    NOSE   History of Helicobacter pylori infection 04/2008   +gastric biopsy (gastritis but no metaplasia, dysplasia, or malignancy identified)   History of kidney stones    History of TIA (transient ischemic attack)    secondary to HELLP syndrome 1994 post c/s--  residual memory loss   Hyperlipidemia    statin started after her NSTEMI: goal  LDL <70.   Hypertension    Ischemic cardiomyopathy 05/2018   Echo EF 45-50% in context of NSTEMI from CA spasm. // Echo 8/21: 55-60, mild LVH, normal RVSF, RVSP 40, trivial MR   Left foot pain    Hallux deformity+adhesive capsulitis+ hammertoe:  Severe structural bunion deformity with hallux interphalangeus and severe arthritis of the  second MPJ left foot--surgical repair/osteotomies by Dr. [name] 04/2017.   Memory loss    Abnl MRI brain and CT brain c/w chronic microvascular ischemia.  Repeat MRI brain 02/2014 stable (Dr. Levell Reach with no plan to f/u with neuro as of 04/2014)   NSTEMI (non-ST elevated myocardial infarction) (HCC) 05/2018   Echo EF 45-50% in context of NSTEMI from CA spasm.// Myoview  8/21: EF 66, no ischemia, low risk   Osteoarthritis of left wrist    STT and 1st [facility] jt   Osteoporosis 05/2010   2012 'penia; 06/2015 'porosis:  prolia .  DEXA 09/2017 T-score -2.2 femur neck. 11/2021 T score -2.2. Rpt 2 yrs   Pelvic floor dysfunction     [facility] (Dr. Dulcy Gibney)   Postmenopausal vaginal bleeding 05/2017   x 1 small episode; GYN attempted endo bx but unable to penetrate cervix due to severe cerv stenosis (due to postmenopausal state + hx of LEEP).  Endo u/s showed thin endo lining.  Per GYN---no evidence of endomet pathology on w/u---obs as of 07/2017.   Prediabetes 06/2016   Fasting gluc 105; HbA1c at that time was 6.0%.  A1c 6.2% 12/2017.  A1c 5.7% Feb 2023.   Recurrent kidney stones 08/2013   Left ureteral calculus: Perc nephr--cystoscopy w/ureteroscopy + laser for stone removal.  Residual asymptomatic left renal nephrolithiasis <6mm present post-procedure.  Right sided hydronephrosis-persistent (on u/s)--urol ordered CT to further eval 08/2016: 1.6 cm nonobstructing stone lower pole left kidney, no hydro, plan for PCN extraction ([facility])   Sepsis (HCC)    UTI   Suprapubic discomfort 06/05/2015   TMJ (temporomandibular joint disorder)    USES  MOUTH GUARD   Toe fracture, left 12/2017   2nd toe prox phalanx (immobilization, Dr. Seward Dao)   Vitamin D deficiency 05/2011    Past Surgical History:  Procedure Laterality Date   ABIs  11/29/2019   NORMAL   ANTERIOR CERVICAL DECOMP/DISCECTOMY FUSION  06/30/2009   C3 -- C5  AND EXPLORATION OF FUSION C5-7 W/  PLATE REMOVAL   ARTHRODESIS METATARSALPHALANGEAL JOINT (MTPJ) Left 07/08/2021   Procedure: Left hallux metatarsophalangeal joint arthrodesis;  Surgeon: Amada Backer, MD;  Location: [facility];  Service: Orthopedics;  Laterality: Left;   BACK SURGERY     BUNIONECTOMY Left    CARDIOVASCULAR STRESS TEST  12/03/2019   myoc perf im: NORMAL   CERVICAL FUSION  2002   anterior C5 -- C7   CERVICAL SPINE SURGERY  08/27/1999     C6 -- T1  LAMINECTOMY/  DISKECTOMY   CESAREAN SECTION  1994   CHOLECYSTECTOMY N/A 03/06/2020   Procedure: LAPAROSCOPIC CHOLECYSTECTOMY;  Surgeon: Junie Olds, MD;  Location: [facility] OR;  Service: General;  Laterality: N/A;   COLONOSCOPY W/  POLYPECTOMY  04/2008;08/2014   2016 tubular adenoma x 1; +diverticulosis and int/ext hemorrhoids.  06/2020 adenoma, recall 5 yrs.   CYSTOSCOPY WITH URETEROSCOPY AND STENT PLACEMENT Left 08/28/2013   Procedure: CYSTOSCOPY, RGP,  WITH URETEROSCOPY AND STENT REMOVAL;  Surgeon: Livingston Rigg, MD;  Location: [facility];  Service: Urology;  Laterality: Left;   DEXA  09/2017   T-score -2.2 femur neck: improved compared to 06/2015.  2021 DEXA T score -2.3.  11/2021 DEXA T score -2.2.   ESOPHAGOGASTRODUODENOSCOPY  2010; 08/2014   2016 +Candidal esophagitis; Mild chronic gastritis w/intestinal metaplasia--NEG H pylori, neg for eosinophilic esoph. 06/2020 esoph candidiasis (diflucan  x 20d), o/w normal.   HAMMER TOE SURGERY Left 07/08/2021   Procedure: Second metatarsal head excision;  revision Second hammertoe correction; 2-5 percutaneus flexor tenotomies;  Surgeon: Amada Backer, MD;  Location: [facility];  Service: Orthopedics;  Laterality: Left;   HARDWARE REMOVAL Left 07/08/2021   Procedure: Removal of deep implants from hallux proximal phalanx and First and Second metatarsals;  Surgeon: Amada Backer, MD;  Location: [facility];  Service: Orthopedics;  Laterality: Left;   HIP PINNING,CANNULATED Right 01/28/2023   Procedure: PERCUTANEOUS FIXATION OF RIGHT FEMORAL NECK;  Surgeon: Janeth Medicus, MD;  Location: [facility] OR;  Service: Orthopedics;  Laterality: Right;   HOLMIUM LASER APPLICATION Left 08/28/2013   Procedure: HOLMIUM LASER APPLICATION;  Surgeon: Livingston Rigg, MD;  Location: [facility];  Service: Urology;  Laterality: Left;   LEFT HEART CATH AND CORONARY ANGIOGRAPHY N/A 05/15/2018   Evidence for spasm in distal LAD.  Mod RCA atherosclerosis, o/w no CAD.  Procedure: LEFT HEART CATH AND CORONARY ANGIOGRAPHY;  Surgeon: Arleen Lacer, MD;  Location: [facility] INVASIVE CV LAB;  Service: Cardiovascular;  Laterality: N/A;   NEGATIVE SLEEP STUDY   04/01/2007   NEPHROLITHOTOMY Left 08/05/2013   Procedure: NEPHROLITHOTOMY PERCUTANEOUS;  Surgeon: Livingston Rigg, MD;  Location: [facility];  Service: Urology;  Laterality: Left;   POLYSOMNOGRAM  01/2019   NORMAL   POLYSOMNOGRAPHY  10/25/2018   Dr. Ivana Maris due to pt inadequat sleep time (83 min).   TONSILLECTOMY AND ADENOIDECTOMY  1975   TOTAL KNEE ARTHROPLASTY Right 2003   TOTAL KNEE REVISION Right 01/19/2022   Procedure: Right knee polyethylene revision;  Surgeon: Liliane Rei, MD;  Location: [facility];  Service: Orthopedics;  Laterality: Right;   TRANSTHORACIC ECHOCARDIOGRAM  05/2018; 12/03/2019   (after MI secondary to arterial spasm): EF 45-50%; severe hypokinesis of apical anterolateral, inferolateral , anterior, and inferior LV myocardium. 11/2019 EF 55-60%, valves fine   ULTRASOUND EXAM,PELVIC COMPLETE ([facility] HX)  06/25/2015   NORMAL (done by GYN)    Outpatient Medications Prior to Visit  Medication Sig Dispense Refill   albuterol  (VENTOLIN  HFA) 108 (90 Base) MCG/ACT inhaler TAKE 2 PUFFS BY MOUTH EVERY 6 HOURS AS NEEDED FOR WHEEZE OR SHORTNESS OF BREATH 18 each 1   ALPRAZolam  (XANAX ) 0.5 MG tablet TAKE 1 TABLET BY MOUTH THREE TIMES A DAY AS NEEDED FOR ANXIETY 90 tablet 5   atorvastatin  (LIPITOR ) 80 MG tablet TAKE 1 TABLET BY MOUTH DAILY. (Patient taking differently: Take 80 mg by mouth every evening.) 90 tablet 3   buPROPion  (WELLBUTRIN  XL) 150 MG 24 hr tablet Take 150 mg by mouth daily.     citalopram  (CELEXA ) 20 MG tablet Take 1 tablet (20 mg total) by mouth in the morning and at bedtime.     famotidine  (PEPCID ) 40 MG tablet Take 1 tablet (40 mg total) by mouth daily. 90 tablet 3   fluticasone  (FLONASE ) 50 MCG/ACT nasal spray SPRAY 2 SPRAYS INTO EACH NOSTRIL EVERY DAY 48 mL 1   hydrochlorothiazide  (HYDRODIURIL ) 12.5 MG tablet 2 tabs po qd     HYDROcodone -acetaminophen  (NORCO/VICODIN) 5-325 MG tablet 1-2 tab po bid as needed for pain 90 tablet 0   KLOR-CON  M20 20 MEQ  tablet TAKE 1 TABLET BY MOUTH EVERY DAY 90 tablet 1   lactulose  (CHRONULAC ) 10 GM/15ML solution Take 30 mLs (20 g total) by mouth daily. 236 mL 3   lamoTRIgine  (LAMICTAL ) 100 MG tablet TAKE 3 TABLETS (300 MG TOTAL) BY MOUTH AT BEDTIME 270 tablet 2   loratadine  (CLARITIN ) 10 MG tablet Take 10 mg by mouth daily.  losartan  (COZAAR ) 50 MG tablet Take 1 tablet (50 mg total) by mouth daily.     meloxicam  (MOBIC ) 15 MG tablet TAKE 1 TABLET BY MOUTH ONCE DAILY AS NEEDED FOR JOINT PAIN 30 tablet 2   metoprolol  succinate (TOPROL -XL) 100 MG 24 hr tablet 1 and 1/2 tabs p.o. daily.  Take with or immediately following a meal.     montelukast  (SINGULAIR ) 10 MG tablet TAKE 1 TABLET BY MOUTH EVERY DAY IN THE EVENING 90 tablet 1   naloxegol  oxalate (MOVANTIK ) 25 MG TABS tablet Take 1 tablet (25 mg total) by mouth daily. 30 tablet 5   pantoprazole  (PROTONIX ) 40 MG tablet TAKE 1 TABLET (40 MG TOTAL) BY MOUTH DAILY. KEEP F/U APPT FOR ANY FURTHER REFILLS 90 tablet 1   Tiotropium Bromide -Olodaterol (STIOLTO RESPIMAT ) 2.5-2.5 MCG/ACT AERS Inhale 2 each into the lungs daily. 4 g 5   zolpidem  (AMBIEN ) 10 MG tablet TAKE 1 TABLET BY MOUTH EVERYDAY AT BEDTIME 90 tablet 1   amLODipine  (NORVASC ) 10 MG tablet Take 1 tablet (10 mg total) by mouth daily. 30 tablet 0   nitroGLYCERIN  (NITROSTAT ) 0.4 MG SL tablet PLACE 1 TABLET (0.4 MG TOTAL) UNDER THE TONGUE EVERY 5 (FIVE) MINUTES X 3 DOSES AS NEEDED FOR CHEST PAIN. 25 tablet 1   No facility-administered medications prior to visit.    Allergies  Allergen Reactions   Ceftin [Cefuroxime Axetil] Anaphylaxis and Swelling    Swelling of eyes and tongue   Cipro  [Ciprofloxacin  Hcl] Anaphylaxis and Swelling   Risperdal  [Risperidone ] Palpitations    Generalized weakness Malaise  Hypertension   Fosamax  [Alendronate  Sodium] Diarrhea   Linzess [Linaclotide] Diarrhea   Morphine  And Codeine Other (See Comments)    Hallucinations Disorientation    Prednisone  Other (See Comments)     Hyperactivity   Roxicodone  [Oxycodone ] Other (See Comments)    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     Review of Systems As per HPI  PE:    07/25/2023    8:00 PM 07/25/2023    7:30 PM 07/25/2023    6:00 PM  Vitals with BMI  Systolic 125 123 562  Diastolic 62 80 75  Pulse 80 85 86     Physical Exam  Gen: Alert, well appearing.  Patient is oriented to person, place, time, and situation. Left shoulder: Mild discomfort to palpation around the acromion. No AC joint tenderness.  No biceps tendon tenderness. She has the most pain with Hawkins and Neer's.  LABS:  Last metabolic panel Lab Results  Component Value Date   GLUCOSE 111 (H) 07/25/2023   NA 135 07/25/2023   K 3.7 07/25/2023   CL 100 07/25/2023   CO2 26 07/25/2023   BUN 18 07/25/2023   CREATININE 0.91 07/25/2023   GFRNONAA >60 07/25/2023   CALCIUM  9.0 07/25/2023   PROT 6.5 07/25/2023   ALBUMIN  3.8 07/25/2023   BILITOT 0.9 07/25/2023   ALKPHOS 63 07/25/2023   AST 13 (L) 07/25/2023   ALT 19 07/25/2023   ANIONGAP 9 07/25/2023   IMPRESSION AND PLAN:  #1 uncontrolled hypertension. Will start amlodipine  10 mg a day as intended last visit.  Continue HCTZ 25 mg a day, losartan  100 mg a day, and Toprol -XL 150 mg a day.  #2 chronic left shoulder pain.  Suspect rotator cuff impingement syndrome.  Certainly could have a component of osteoarthritis. She elected for diagnostic and therapeutic injection today.  She will get the shoulder x-ray ordered  last visit.  Ultrasound-guided injection is preferred based on studies that show increased duration, increased effect, greater accuracy, decreased procedural pain, increased response rate, and decreased cost with ultrasound-guided versus blind injection. Procedure: Real-time ultrasound guided injection of LEFT subacromial bursa. Device: GE [employer] informed consent obtained.  Timeout conducted.  No overlying  erythema, induration, or other signs of local infection. After sterile prep with Betadine , injected 3 ml 1% plain lidocaine  for local anesthesia then followed this with mixture of 3 ml plain 1% lidocaine  plus 40mg  kenalog .  Injectate seen filling subacromial bursa. Patient tolerated the procedure well.  No immediate complications.  Post-injection care discussed. Advised to call if fever/chills, erythema, drainage, or persistent bleeding. Impression: Technically successful ultrasound-guided injection.  An After Visit Summary was printed and given to the patient.  FOLLOW UP: Return in about 1 week (around 07/14/2023) for f/u HTN and L shoulder.  Signed:  Arletha Lady, MD           07/31/2023

## 2023-07-07 NOTE — Patient Instructions (Addendum)
 -CHRONIC OBSTRUCTIVE PULMONARY DISEASE (COPD) WITH EMPHYSEMA: COPD is a chronic lung disease that makes it hard to breathe, and emphysema is a type of COPD that damages the air sacs in your lungs. You will continue using your Stiolto inhaler, and I recommend starting pulmonary rehabilitation to help improve your breathing. We will also repeat spirometry to assess your lung function.  -GASTROESOPHAGEAL REFLUX DISEASE (GERD) WITH HIATAL HERNIA: GERD is a condition where stomach acid frequently flows back into the tube connecting your mouth and stomach, and a hiatal hernia occurs when part of your stomach pushes up through your diaphragm. Continue taking Protonix in the morning and Pepcid at bedtime, and follow dietary modifications to avoid foods that worsen your reflux.  -CANKER SORE: A canker sore is a small, shallow sore inside the mouth or at the base of the gums.  INSTRUCTIONS: Please continue using your Stiolto inhaler and follow the dietary modifications for GERD. Start pulmonary rehabilitation at the [facility] location if possible, and we will repeat spirometry to assess your lung function. If you have any concerns or your symptoms worsen, please schedule a follow-up appointment.  Follow-up: 3 months with Dr. Isaiah Serge or Waynetta Sandy NP  Please schedule PFT 1 hour - first available       GERD in Adults: Diet Changes When you have gastroesophageal reflux disease (GERD), you may need to make changes to your diet. Choosing the right foods can help with your symptoms. Think about working with an expert in healthy eating called a dietitian. They can help you make healthy food choices. What are tips for following this plan? Reading food labels Look for foods that are low in saturated fat. Foods that may help with your symptoms include: Foods with less than 5% of daily value (DV) of fat. Foods with 0 grams of trans fat. Cooking [employer] in ways that don't use a lot of fat. These ways  include: Baking. Steaming. Grilling. Broiling. To add flavor, try to use herbs that are low in spice and acidity. Avoid frying your food. Meal planning  Eat small meals often rather than eating 3 large meals each day. Eat your meals slowly in a place where you feel relaxed. If told by your health care provider, avoid: Foods that cause symptoms. Keep a food diary to keep track of foods that cause symptoms. Alcohol. Drinking a lot of liquid with meals. General instructions For 2-3 hours after you eat, avoid: Bending over. Exercise. Lying down. Chew sugar-free gum after meals. What foods should I eat? Eat a healthy diet. Try to include: Foods with high amounts of fiber. These include: Fruits and vegetables. Whole grains and beans. Low-fat dairy products. Lean meats, fish, and poultry. Egg whites. Foods that cause symptoms in someone else may not cause symptoms for you. Work with your provider to find foods that are safe for you. The items listed above may not be all the foods and drinks you can have. Talk with a dietitian to learn more. The items listed above may not be a complete list of foods and beverages you can eat and drink. Contact a dietitian for more information. What foods should I avoid? Limiting some of these foods may help with your symptoms. Each person is different. Talk with a dietitian or your provider to help you find the exact foods to avoid. Some of the foods to avoid may include: Fruits Fruits with a lot of acid in them. These may include citrus fruits, such as oranges,  grapefruit, pineapple, and lemons. Vegetables Deep-fried vegetables, such as [place] fries. Vegetables, sauces, or toppings made with added fat and vegetables with acid in them. These may include tomatoes and tomato products, chili peppers, onions, garlic, and horseradish. Grains Pastries or quick breads with added fat. Meats and other proteins High-fat meats, such as fatty beef or pork, hot  dogs, ribs, ham, sausage, salami, and bacon. Fried meat or protein, such as fried fish and fried chicken. Egg yolks. Fats and oils Butter. Margarine. Shortening. Ghee. Drinks Coffee and other drinks with caffeine in them. Fizzy and sugary drinks, such as soda and energy drinks. Fruit juice made with acidic fruits, such as orange or grapefruit. Tomato juice. Sweets and desserts Chocolate and cocoa. Donuts. Seasonings and condiments Mint, such as peppermint and spearmint. Condiments, herbs, or seasonings that cause symptoms. These may include curry, hot sauce, or vinegar-based salad dressings. The items listed above may not be all the foods and drinks you should avoid. Talk with a dietitian to learn more. Questions to ask your health care provider Changes to your diet and everyday life are often the first steps taken to manage symptoms of GERD. If these changes don't help, talk with your provider about taking medicines. Where to find more information [employer] G[employer]intestinal [employer]: aboutgerd.org This information is not intended to replace advice given to you by your health care provider. Make sure you discuss any questions you have with your health care provider. Document Revised: 01/31/2023 Document Reviewed: 08/17/2022 Elsevier Patient Education  2024 [employer].

## 2023-07-07 NOTE — Patient Instructions (Signed)
 Start amlodipine (new blood pressure medication) today.

## 2023-07-10 NOTE — Telephone Encounter (Signed)
 Copied from CRM [id number]. Topic: Referral - Question >> Jul 07, 2023  2:17 PM Renie Ora wrote: Reason for CRM: Carlette with [facility] called stating there referral is missing the MD signature and it must be on the referral in order for the insurance to reimburse for pulmonary rehab, Carlette stated she can be reached at [phone]. Carlette stated Dr.Mannam has to electronically sign the referral.  Spoke with Carlette regarding prior message.   Dr.Mannam can you please advise .  Thank you

## 2023-07-12 ENCOUNTER — Ambulatory Visit (HOSPITAL_BASED_OUTPATIENT_CLINIC_OR_DEPARTMENT_OTHER)
Admission: RE | Admit: 2023-07-12 | Discharge: 2023-07-12 | Disposition: A | Discharge status: Home / Self Care | Source: Ambulatory Visit | Attending: Family Medicine | Admitting: Family Medicine

## 2023-07-12 DIAGNOSIS — G8929 Other chronic pain: Secondary | ICD-10-CM | POA: Diagnosis not present

## 2023-07-12 DIAGNOSIS — S40012A Contusion of left shoulder, initial encounter: Secondary | ICD-10-CM | POA: Insufficient documentation

## 2023-07-12 DIAGNOSIS — M25512 Pain in left shoulder: Secondary | ICD-10-CM | POA: Insufficient documentation

## 2023-07-13 ENCOUNTER — Ambulatory Visit (INDEPENDENT_AMBULATORY_CARE_PROVIDER_SITE_OTHER): Admitting: Family Medicine

## 2023-07-13 ENCOUNTER — Encounter: Payer: Self-pay | Admitting: Family Medicine

## 2023-07-13 VITALS — BP 116/60 | HR 57 | Temp 98.2°F | Ht 68.0 in | Wt 174.6 lb

## 2023-07-13 DIAGNOSIS — K5909 Other constipation: Secondary | ICD-10-CM | POA: Diagnosis not present

## 2023-07-13 DIAGNOSIS — I10 Essential (primary) hypertension: Secondary | ICD-10-CM

## 2023-07-13 DIAGNOSIS — G8929 Other chronic pain: Secondary | ICD-10-CM | POA: Diagnosis not present

## 2023-07-13 DIAGNOSIS — M7542 Impingement syndrome of left shoulder: Secondary | ICD-10-CM | POA: Diagnosis not present

## 2023-07-13 DIAGNOSIS — M25512 Pain in left shoulder: Secondary | ICD-10-CM

## 2023-07-13 NOTE — Progress Notes (Signed)
 OFFICE VISIT  07/13/2023  CC:  Chief Complaint  Patient presents with   Medical Management of Chronic Issues    Patient is a 73 y.o. female who presents for 1 week follow-up left shoulder pain and uncontrolled hypertension. Last visit we started amlodipine 10 mg daily and I did left subacromial shoulder injection.  HPI: Erin Lopez is feeling much better. She no longer feels like her blood pressure is elevated. Her shoulder pain has gone away.  She still has some limited range of motion, though.  Still working on getting bowels moving regularly.  She did start the Movantik and has no problem with that so far.  She is apprehensive about taking too many laxatives because of fear of dehydration.  ROS as above, plus--> no fevers, no CP, no SOB, no wheezing, no cough, no dizziness, no HAs, no rashes, no melena/hematochezia.  No polyuria or polydipsia.  No myalgias or arthralgias.  No focal weakness, paresthesias, or tremors.  No acute vision or hearing abnormalities.  No dysuria or unusual/new urinary urgency or frequency.  No recent changes in lower legs. No n/v/d or abd pain.  No palpitations.    Past Medical History:  Diagnosis Date   Acute encephalopathy 11/19/2017   Alcoholism in remission [facility])    states last alcohol 11 yrs ago   Anemia    Anxiety    Bipolar 1 disorder (HCC)    Admission for mania x 2 (most recent 11/2017)   Cervical spondylosis without myelopathy 12/19/2013   MRI 02/2014: multilevel DDD/spondylosis, not much change compared to prior MRI.  Pt not in favor of invasive therapy for her neck as of 04/2014.   Chronic constipation    Chronic renal insufficiency, stage 3 (moderate) (HCC)    COPD (chronic obstructive pulmonary disease) (HCC)    Moderate: anoro started 04/2017 by pulm, pt symptomatically improved and PFTs stable at f/u 06/2017.   Coronary artery spasm (HCC) 07/11/2018   nitrates=HA. Amlodipine=intol swelling.  Changed to coreg 09/03/18 by cardiologist.    Depression    Endometritis 05/2017   possible; empiric tx with Flagyl by GYN.   Femoral neck fracture (HCC)    right, 01/28/23-->ORIF   Fibromyalgia    GERD (gastroesophageal reflux disease)    Hepatic steatosis 03/2019   u/s abd   Herpes zoster 08/07/2017   L scapular region   Hiatal hernia    History of adenomatous polyp of colon 04/2008; 08/2014   No high grade dysplasia: recall 5 yrs (Dr. Bosie Clos, [facility])   History of basal cell carcinoma excision    NOSE   History of Helicobacter pylori infection 04/2008   +gastric biopsy (gastritis but no metaplasia, dysplasia, or malignancy identified)   History of kidney stones    History of TIA (transient ischemic attack)    secondary to HELLP syndrome 1994 post c/s--  residual memory loss   Hyperlipidemia    statin started after her NSTEMI: goal  LDL <70.   Hypertension    Ischemic cardiomyopathy 05/2018   Echo EF 45-50% in context of NSTEMI from CA spasm. 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 Charlsie Merles 04/2017.   Memory loss    Abnl MRI brain and CT brain c/w chronic microvascular ischemia.  Repeat MRI brain 02/2014 stable (Dr. Radonna Ricker with  no plan to f/u with neuro as of 04/2014)   NSTEMI (non-ST elevated myocardial infarction) (HCC) 05/2018   Echo EF 45-50% in context of NSTEMI from CA spasm.// Myoview 8/21: EF 66, no ischemia, low risk   Osteoarthritis of left wrist    STT and 1st [facility] jt   Osteoporosis 05/2010   2012 'penia; 06/2015 'porosis:  prolia.  DEXA 09/2017 T-score -2.2 femur neck. 11/2021 T score -2.2. Rpt 2 yrs   Pelvic floor dysfunction    [facility] (Dr. Marlou Porch)   Postmenopausal vaginal bleeding 05/2017   x 1 small episode; GYN attempted endo bx but unable to penetrate cervix  due to severe cerv stenosis (due to postmenopausal state + hx of LEEP).  Endo u/s showed thin endo lining.  Per GYN---no evidence of endomet pathology on w/u---obs as of 07/2017.   Prediabetes 06/2016   Fasting gluc 105; HbA1c at that time was 6.0%.  A1c 6.2% 12/2017.  A1c 5.7% Feb 2023.   Recurrent kidney stones 08/2013   Left ureteral calculus: Perc nephr--cystoscopy w/ureteroscopy + laser for stone removal.  Residual asymptomatic left renal nephrolithiasis <76mm present post-procedure.  Right sided hydronephrosis-persistent (on u/s)--urol ordered CT to further eval 08/2016: 1.6 cm nonobstructing stone lower pole left kidney, no hydro, plan for PCN extraction ([facility])   Sepsis (HCC)    UTI   Suprapubic discomfort 06/05/2015   TMJ (temporomandibular joint disorder)    USES  MOUTH GUARD   Toe fracture, left 12/2017   2nd toe prox phalanx (immobilization, Dr. Luciana Axe)   Vitamin D deficiency 05/2011    Past Surgical History:  Procedure Laterality Date   ABIs  11/29/2019   NORMAL   ANTERIOR CERVICAL DECOMP/DISCECTOMY FUSION  06/30/2009   C3 -- C5  AND EXPLORATION OF FUSION C5-7 W/  PLATE REMOVAL   ARTHRODESIS METATARSALPHALANGEAL JOINT (MTPJ) Left 07/08/2021   Procedure: Left hallux metatarsophalangeal joint arthrodesis;  Surgeon: Toni Arthurs, MD;  Location: [facility];  Service: Orthopedics;  Laterality: Left;   BACK SURGERY     BUNIONECTOMY Left    CARDIOVASCULAR STRESS TEST  12/03/2019   myoc perf im: NORMAL   CERVICAL FUSION  2002   anterior C5 -- C7   CERVICAL SPINE SURGERY  08/27/1999     C6 -- T1  LAMINECTOMY/  DISKECTOMY   CESAREAN SECTION  1994   CHOLECYSTECTOMY N/A 03/06/2020   Procedure: LAPAROSCOPIC CHOLECYSTECTOMY;  Surgeon: Quentin Ore, MD;  Location: [facility] OR;  Service: General;  Laterality: N/A;   COLONOSCOPY W/ POLYPECTOMY  04/2008;08/2014   2016 tubular adenoma x 1; +diverticulosis and int/ext hemorrhoids.  06/2020 adenoma, recall 5 yrs.   CYSTOSCOPY  WITH URETEROSCOPY AND STENT PLACEMENT Left 08/28/2013   Procedure: CYSTOSCOPY, RGP,  WITH URETEROSCOPY AND STENT REMOVAL;  Surgeon: Valetta Fuller, MD;  Location: [facility];  Service: Urology;  Laterality: Left;   DEXA  09/2017   T-score -2.2 femur neck: improved compared to 06/2015.  2021 DEXA T score -2.3.  11/2021 DEXA T score -2.2.   ESOPHAGOGASTRODUODENOSCOPY  2010; 08/2014   2016 +Candidal esophagitis; Mild chronic gastritis w/intestinal metaplasia--NEG H pylori, neg for eosinophilic esoph. 06/2020 esoph candidiasis (diflucan x 20d), o/w normal.   HAMMER TOE SURGERY Left 07/08/2021   Procedure: Second metatarsal head excision; revision Second hammertoe correction; 2-5 percutaneus flexor tenotomies;  Surgeon: Toni Arthurs, MD;  Location: [facility];  Service: Orthopedics;  Laterality: Left;   HARDWARE REMOVAL Left 07/08/2021   Procedure: Removal  of deep implants from hallux proximal phalanx and First and Second metatarsals;  Surgeon: Toni Arthurs, MD;  Location: [facility];  Service: Orthopedics;  Laterality: Left;   HIP PINNING,CANNULATED Right 01/28/2023   Procedure: PERCUTANEOUS FIXATION OF RIGHT FEMORAL NECK;  Surgeon: Yolonda Kida, MD;  Location: [facility] OR;  Service: Orthopedics;  Laterality: Right;   HOLMIUM LASER APPLICATION Left 08/28/2013   Procedure: HOLMIUM LASER APPLICATION;  Surgeon: Valetta Fuller, MD;  Location: [facility];  Service: Urology;  Laterality: Left;   LEFT HEART CATH AND CORONARY ANGIOGRAPHY N/A 05/15/2018   Evidence for spasm in distal LAD.  Mod RCA atherosclerosis, o/w no CAD.  Procedure: LEFT HEART CATH AND CORONARY ANGIOGRAPHY;  Surgeon: Marykay Lex, MD;  Location: [facility] INVASIVE CV LAB;  Service: Cardiovascular;  Laterality: N/A;   NEGATIVE SLEEP STUDY  04/01/2007   NEPHROLITHOTOMY Left 08/05/2013   Procedure: NEPHROLITHOTOMY PERCUTANEOUS;  Surgeon: Valetta Fuller, MD;  Location: [facility];  Service:  Urology;  Laterality: Left;   POLYSOMNOGRAM  01/2019   NORMAL   POLYSOMNOGRAPHY  10/25/2018   Dr. Luetta Nutting due to pt inadequat sleep time (83 min).   TONSILLECTOMY AND ADENOIDECTOMY  1975   TOTAL KNEE ARTHROPLASTY Right 2003   TOTAL KNEE REVISION Right 01/19/2022   Procedure: Right knee polyethylene revision;  Surgeon: Ollen Gross, MD;  Location: [facility];  Service: Orthopedics;  Laterality: Right;   TRANSTHORACIC ECHOCARDIOGRAM  05/2018; 12/03/2019   (after MI secondary to arterial spasm): EF 45-50%; severe hypokinesis of apical anterolateral, inferolateral , anterior, and inferior LV myocardium. 11/2019 EF 55-60%, valves fine   ULTRASOUND EXAM,PELVIC COMPLETE ([facility] HX)  06/25/2015   NORMAL (done by GYN)    Outpatient Medications Prior to Visit  Medication Sig Dispense Refill   ALPRAZolam (XANAX) 0.5 MG tablet TAKE 1 TABLET BY MOUTH THREE TIMES A DAY AS NEEDED FOR ANXIETY 90 tablet 5   amLODipine (NORVASC) 10 MG tablet Take 1 tablet (10 mg total) by mouth daily. 30 tablet 0   atorvastatin (LIPITOR) 80 MG tablet TAKE 1 TABLET BY MOUTH DAILY. (Patient taking differently: Take 80 mg by mouth every evening.) 90 tablet 3   buPROPion (WELLBUTRIN XL) 150 MG 24 hr tablet Take 150 mg by mouth daily.     citalopram (CELEXA) 20 MG tablet Take 1 tablet (20 mg total) by mouth in the morning and at bedtime.     famotidine (PEPCID) 40 MG tablet Take 1 tablet (40 mg total) by mouth daily. 90 tablet 3   fluticasone (FLONASE) 50 MCG/ACT nasal spray SPRAY 2 SPRAYS INTO EACH NOSTRIL EVERY DAY 48 mL 1   hydrochlorothiazide (HYDRODIURIL) 12.5 MG tablet 2 tabs po qd     HYDROcodone-acetaminophen (NORCO/VICODIN) 5-325 MG tablet 1-2 tab po bid as needed for pain 90 tablet 0   KLOR-CON M20 20 MEQ tablet TAKE 1 TABLET BY MOUTH EVERY DAY 90 tablet 1   lactulose (CHRONULAC) 10 GM/15ML solution Take 30 mLs (20 g total) by mouth daily. 236 mL 3   lamoTRIgine (LAMICTAL) 100 MG tablet TAKE 3 TABLETS  (300 MG TOTAL) BY MOUTH AT BEDTIME 270 tablet 2   loratadine (CLARITIN) 10 MG tablet Take 10 mg by mouth daily.     losartan (COZAAR) 50 MG tablet Take 1 tablet (50 mg total) by mouth daily.     meloxicam (MOBIC) 15 MG tablet TAKE 1 TABLET BY MOUTH ONCE DAILY AS NEEDED FOR JOINT PAIN 30 tablet 2   metoprolol  succinate (TOPROL-XL) 100 MG 24 hr tablet 1 and 1/2 tabs p.o. daily.  Take with or immediately following a meal.     montelukast (SINGULAIR) 10 MG tablet TAKE 1 TABLET BY MOUTH EVERY DAY IN THE EVENING 90 tablet 1   naloxegol oxalate (MOVANTIK) 25 MG TABS tablet Take 1 tablet (25 mg total) by mouth daily. 30 tablet 5   pantoprazole (PROTONIX) 40 MG tablet TAKE 1 TABLET (40 MG TOTAL) BY MOUTH DAILY. KEEP F/U APPT FOR ANY FURTHER REFILLS 90 tablet 1   Tiotropium Bromide-Olodaterol (STIOLTO RESPIMAT) 2.5-2.5 MCG/ACT AERS Inhale 2 each into the lungs daily. 4 g 5   zolpidem (AMBIEN) 10 MG tablet TAKE 1 TABLET BY MOUTH EVERYDAY AT BEDTIME 90 tablet 1   albuterol (VENTOLIN HFA) 108 (90 Base) MCG/ACT inhaler TAKE 2 PUFFS BY MOUTH EVERY 6 HOURS AS NEEDED FOR WHEEZE OR SHORTNESS OF BREATH (Patient not taking: Reported on 07/13/2023) 18 each 1   nitroGLYCERIN (NITROSTAT) 0.4 MG SL tablet PLACE 1 TABLET (0.4 MG TOTAL) UNDER THE TONGUE EVERY 5 (FIVE) MINUTES X 3 DOSES AS NEEDED FOR CHEST PAIN. (Patient not taking: Reported on 07/13/2023) 25 tablet 1   No facility-administered medications prior to visit.    Allergies  Allergen Reactions   Ceftin [Cefuroxime Axetil] Anaphylaxis and Swelling    Swelling of eyes and tongue   Cipro [Ciprofloxacin Hcl] Anaphylaxis and Swelling   Risperdal [Risperidone] Palpitations    Generalized weakness Malaise  Hypertension   Fosamax [Alendronate Sodium] Diarrhea   Linzess [Linaclotide] Diarrhea   Morphine And Codeine Other (See Comments)    Hallucinations Disorientation    Prednisone Other (See Comments)    Hyperactivity   Roxicodone [Oxycodone] Other (See  Comments)    Previously addicted to medication   Seroquel [Quetiapine] Other (See Comments)    Oversedation     Review of Systems  As per HPI  PE:    07/13/2023    1:33 PM 07/07/2023    2:00 PM 07/07/2023    1:57 PM  Vitals with BMI  Height 5\' 8"   5\' 8"   Weight 174 lbs 10 oz  177 lbs 6 oz  BMI 26.55  26.98  Systolic 116 150 409  Diastolic 60 64 72  Pulse 57  75     Physical Exam  Gen: Alert, well appearing.  Patient is oriented to person, place, time, and situation. AFFECT: pleasant, lucid thought and speech. Left shoulder without any tenderness to palpation. She has 100 degrees of abduction and about 75 degrees of external rotation.  She has negative drop sign.  Positive Hawkins.  Negative speeds.  Internal rotation is full although a little bit stiff.  LABS:  Last metabolic panel Lab Results  Component Value Date   GLUCOSE 90 06/09/2023   NA 137 06/09/2023   K 4.1 06/09/2023   CL 99 06/09/2023   CO2 30 06/09/2023   BUN 19 06/09/2023   CREATININE 1.02 (H) 06/09/2023   GFRNONAA 58 (L) 05/28/2023   CALCIUM 10.1 06/09/2023   PROT 6.3 05/12/2023   ALBUMIN 4.5 05/12/2023   BILITOT 0.8 05/12/2023   ALKPHOS 66 05/12/2023   AST 20 05/12/2023   ALT 28 05/12/2023   ANIONGAP 12 05/28/2023   IMPRESSION AND PLAN:  #1 hypertension, well-controlled on amlodipine 10 mg a day, HCTZ 25 mg a day, losartan 50 mg a day and Toprol-XL 150 mg a day.  #2 chronic left shoulder pain.  She has impingement syndrome. She has had marked improvement in  her pain with subacromial steroid injection a week ago.  I definitely think she should proceed with physical therapy, though. She is agreeable to this plan.  Referral ordered  She got a shoulder radiograph today and will look at this when the results is in.  #3 chronic constipation.  This is at least partially opioid-induced. Tolerating Movantik 25 mg a day so far. She has lactulose and MiraLAX to use on an as needed basis. She has Vicodin  5/325 to take twice daily but she tries to use this sparingly--> has not taken any in the last 3 days.  An After Visit Summary was printed and given to the patient.  FOLLOW UP: Return in about 3 months (around 10/12/2023) for routine chronic illness f/u.  Signed:  Santiago Bumpers, MD           07/13/2023

## 2023-07-14 ENCOUNTER — Telehealth (HOSPITAL_COMMUNITY): Payer: Self-pay

## 2023-07-14 NOTE — Telephone Encounter (Signed)
 LVM for pt to see if she was interested in [facility]

## 2023-07-15 ENCOUNTER — Emergency Department
Admission: EM | Admit: 2023-07-15 | Discharge: 2023-07-15 | Disposition: A | Discharge status: Home / Self Care | Attending: Emergency Medicine | Admitting: Emergency Medicine

## 2023-07-15 ENCOUNTER — Other Ambulatory Visit: Payer: Self-pay

## 2023-07-15 ENCOUNTER — Emergency Department

## 2023-07-15 DIAGNOSIS — S52611A Displaced fracture of right ulna styloid process, initial encounter for closed fracture: Secondary | ICD-10-CM | POA: Diagnosis not present

## 2023-07-15 DIAGNOSIS — S62101A Fracture of unspecified carpal bone, right wrist, initial encounter for closed fracture: Secondary | ICD-10-CM

## 2023-07-15 DIAGNOSIS — R410 Disorientation, unspecified: Secondary | ICD-10-CM | POA: Diagnosis not present

## 2023-07-15 DIAGNOSIS — S52571A Other intraarticular fracture of lower end of right radius, initial encounter for closed fracture: Secondary | ICD-10-CM | POA: Insufficient documentation

## 2023-07-15 DIAGNOSIS — S52351A Displaced comminuted fracture of shaft of radius, right arm, initial encounter for closed fracture: Secondary | ICD-10-CM | POA: Diagnosis not present

## 2023-07-15 DIAGNOSIS — J439 Emphysema, unspecified: Secondary | ICD-10-CM | POA: Diagnosis not present

## 2023-07-15 DIAGNOSIS — W19XXXA Unspecified fall, initial encounter: Secondary | ICD-10-CM | POA: Diagnosis not present

## 2023-07-15 DIAGNOSIS — S022XXA Fracture of nasal bones, initial encounter for closed fracture: Secondary | ICD-10-CM | POA: Diagnosis not present

## 2023-07-15 DIAGNOSIS — R519 Headache, unspecified: Secondary | ICD-10-CM | POA: Insufficient documentation

## 2023-07-15 DIAGNOSIS — S6991XA Unspecified injury of right wrist, hand and finger(s), initial encounter: Secondary | ICD-10-CM | POA: Diagnosis not present

## 2023-07-15 DIAGNOSIS — S52501A Unspecified fracture of the lower end of right radius, initial encounter for closed fracture: Secondary | ICD-10-CM | POA: Diagnosis not present

## 2023-07-15 DIAGNOSIS — S0081XA Abrasion of other part of head, initial encounter: Secondary | ICD-10-CM | POA: Insufficient documentation

## 2023-07-15 DIAGNOSIS — Z981 Arthrodesis status: Secondary | ICD-10-CM | POA: Diagnosis not present

## 2023-07-15 DIAGNOSIS — M1811 Unilateral primary osteoarthritis of first carpometacarpal joint, right hand: Secondary | ICD-10-CM | POA: Diagnosis not present

## 2023-07-15 DIAGNOSIS — Y92810 Car as the place of occurrence of the external cause: Secondary | ICD-10-CM | POA: Diagnosis not present

## 2023-07-15 DIAGNOSIS — W010XXA Fall on same level from slipping, tripping and stumbling without subsequent striking against object, initial encounter: Secondary | ICD-10-CM | POA: Insufficient documentation

## 2023-07-15 DIAGNOSIS — R58 Hemorrhage, not elsewhere classified: Secondary | ICD-10-CM | POA: Diagnosis not present

## 2023-07-15 DIAGNOSIS — R220 Localized swelling, mass and lump, head: Secondary | ICD-10-CM | POA: Diagnosis not present

## 2023-07-15 DIAGNOSIS — S0992XA Unspecified injury of nose, initial encounter: Secondary | ICD-10-CM | POA: Diagnosis present

## 2023-07-15 MED ORDER — FENTANYL CITRATE PF 50 MCG/ML IJ SOSY
50.0000 ug | PREFILLED_SYRINGE | Freq: Once | INTRAMUSCULAR | Status: AC
  Administered 2023-07-15: 50 ug via INTRAVENOUS
  Filled 2023-07-15: qty 1

## 2023-07-15 MED ORDER — HYDROCODONE-ACETAMINOPHEN 5-325 MG PO TABS
2.0000 | ORAL_TABLET | Freq: Once | ORAL | Status: AC
  Administered 2023-07-15: 2 via ORAL
  Filled 2023-07-15: qty 2

## 2023-07-15 MED ORDER — HYDROCODONE-ACETAMINOPHEN 5-325 MG PO TABS: 1.0000 | ORAL_TABLET | Freq: Four times a day (QID) | ORAL | 0 refills | Status: DC | PRN

## 2023-07-15 NOTE — ED Notes (Signed)
 Dr. Derrill Kay at bedside.

## 2023-07-15 NOTE — ED Provider Notes (Signed)
 [facility] Provider Note    Event Date/Time   First MD Initiated Contact with Patient 07/15/23 1945     (approximate)   History   Fall   HPI  TEAGHAN MELROSE is a 73 y.o. female who presents to the emergency department today after a fall.  Patient states she was getting out of her car when she fell.  She denies feeling lightheaded or dizzy.  She does think she tripped.  She hit the right side of her face.  Thinks she might have briefly blacked out.  She did have some confusion right after the event.  Complaining of pain to the right side of her face as well as her right wrist.  Denies any recent illness.  Denies any blood thinners.     Physical Exam   Triage Vital Signs: ED Triage Vitals  Encounter Vitals Group     BP 07/15/23 1957 (!) 140/69     Systolic BP Percentile --      Diastolic BP Percentile --      Pulse Rate 07/15/23 1957 68     Resp 07/15/23 1957 16     Temp 07/15/23 1957 97.7 F (36.5 C)     Temp Source 07/15/23 1957 Oral     SpO2 07/15/23 1957 99 %     Weight 07/15/23 1959 175 lb (79.4 kg)     Height 07/15/23 1959 5\' 8"  (1.727 m)     Head Circumference --      Peak Flow --      Pain Score 07/15/23 1958 10     Pain Loc --      Pain Education --      Exclude from Growth Chart --     Most recent vital signs: Vitals:   07/15/23 1957  BP: (!) 140/69  Pulse: 68  Resp: 16  Temp: 97.7 F (36.5 C)  SpO2: 99%   General: Awake, alert, oriented. CV:  Good peripheral perfusion. Regular rate and rhythm. Resp:  Normal effort. Lungs clear. Abd:  No distention.  Other:  Swelling and abrasion to right cheek. Right wrist with swelling and slight deformity, NV intact distally.   ED Results / Procedures / Treatments   Labs (all labs ordered are listed, but only abnormal results are displayed) Labs Reviewed - No data to display   EKG  None   RADIOLOGY I independently interpreted and visualized the CT head/cervical spine,  max face. My interpretation: No ICH. No fracture Radiology interpretation:  IMPRESSION:  1. No acute intracranial abnormality.  2. Acute medially displaced right nasal bone fracture.  3. No acute displaced fracture or traumatic listhesis of the  cervical spine.    I independently interpreted and visualized the right wrist. My interpretation: distal radius fracture Radiology interpretation:  IMPRESSION:  1. Acute intra-articular, displaced, comminuted, dorsally angulated  distal radial fracture.  2. Tiny acute ulnar styloid avulsion fracture.      PROCEDURES:  Critical Care performed: No   MEDICATIONS ORDERED IN ED: Medications - No data to display   IMPRESSION / MDM / ASSESSMENT AND PLAN / ED COURSE  I reviewed the triage vital signs and the nursing notes.                              Differential diagnosis includes, but is not limited to, ICH, fracture, dislocation  Patient's presentation is most consistent with acute presentation with potential threat  to life or bodily function.   Patient presents to the emergency department today after tripping and hitting the right side of her face and injuring her right wrist.  On exam patient has swelling and abrasion to right cheek.  Will get CT of the face, head and neck.  Will get x-ray of the right wrist.  CT head face and neck shows nasal bone fracture but no other concerning acute abnormalities.  Right wrist x-ray does confirm wrist fracture.  Dr. Annabell Key with orthopedics was kind enough to take a look at the x-ray.  Did not feel any reduction was necessary at this time.  Will place patient in splint.  Will have patient follow-up with her orthopedic doctor.  Will give patient brief course of narcotic pain medication.      FINAL CLINICAL IMPRESSION(S) / ED DIAGNOSES   Final diagnoses:  Fall, initial encounter  Closed fracture of right wrist, initial encounter  Closed fracture of nasal bone, initial encounter         Note:  This document was prepared using Dragon voice recognition software and may include unintentional dictation errors.    Marylynn Soho, MD 07/15/23 2223

## 2023-07-15 NOTE — ED Notes (Signed)
 Answered call light, pt inquiring on when she will bc d/c. Advised RN is with another pt at the moment and will make her aware once she is finished.

## 2023-07-15 NOTE — ED Notes (Signed)
Pr verbalizes understanding of discharge instructions

## 2023-07-15 NOTE — ED Triage Notes (Signed)
 Pt presents to ER via EMS. Pt was visiting her daughter. Pt tripped over her shoe and fell face forward. Pt has significant swelling to right side of face. Per EMS visual deformity to right wrist. Pt talks in complete sentences no respiratory distress noted

## 2023-07-15 NOTE — ED Notes (Signed)
 Pt to CT

## 2023-07-16 ENCOUNTER — Encounter: Payer: Self-pay | Admitting: Family Medicine

## 2023-07-20 DIAGNOSIS — Z4789 Encounter for other orthopedic aftercare: Secondary | ICD-10-CM | POA: Diagnosis not present

## 2023-07-20 DIAGNOSIS — S6991XA Unspecified injury of right wrist, hand and finger(s), initial encounter: Secondary | ICD-10-CM | POA: Diagnosis not present

## 2023-07-21 ENCOUNTER — Telehealth: Payer: Self-pay

## 2023-07-21 DIAGNOSIS — J449 Chronic obstructive pulmonary disease, unspecified: Secondary | ICD-10-CM

## 2023-07-21 DIAGNOSIS — I10 Essential (primary) hypertension: Secondary | ICD-10-CM

## 2023-07-25 ENCOUNTER — Emergency Department (HOSPITAL_COMMUNITY)
Admission: EM | Admit: 2023-07-25 | Discharge: 2023-07-25 | Disposition: A | Discharge status: Home / Self Care | Attending: Emergency Medicine | Admitting: Emergency Medicine

## 2023-07-25 ENCOUNTER — Ambulatory Visit: Payer: Self-pay

## 2023-07-25 ENCOUNTER — Emergency Department (HOSPITAL_COMMUNITY)

## 2023-07-25 ENCOUNTER — Other Ambulatory Visit: Payer: Self-pay

## 2023-07-25 ENCOUNTER — Encounter (HOSPITAL_COMMUNITY): Payer: Self-pay | Admitting: Emergency Medicine

## 2023-07-25 DIAGNOSIS — S0083XA Contusion of other part of head, initial encounter: Secondary | ICD-10-CM | POA: Diagnosis not present

## 2023-07-25 DIAGNOSIS — R0602 Shortness of breath: Secondary | ICD-10-CM | POA: Diagnosis not present

## 2023-07-25 DIAGNOSIS — I959 Hypotension, unspecified: Secondary | ICD-10-CM | POA: Diagnosis not present

## 2023-07-25 DIAGNOSIS — R32 Unspecified urinary incontinence: Secondary | ICD-10-CM | POA: Diagnosis not present

## 2023-07-25 DIAGNOSIS — Z8673 Personal history of transient ischemic attack (TIA), and cerebral infarction without residual deficits: Secondary | ICD-10-CM | POA: Insufficient documentation

## 2023-07-25 DIAGNOSIS — J449 Chronic obstructive pulmonary disease, unspecified: Secondary | ICD-10-CM | POA: Insufficient documentation

## 2023-07-25 DIAGNOSIS — R112 Nausea with vomiting, unspecified: Secondary | ICD-10-CM | POA: Diagnosis not present

## 2023-07-25 DIAGNOSIS — R1111 Vomiting without nausea: Secondary | ICD-10-CM | POA: Diagnosis not present

## 2023-07-25 DIAGNOSIS — W1830XA Fall on same level, unspecified, initial encounter: Secondary | ICD-10-CM | POA: Diagnosis not present

## 2023-07-25 DIAGNOSIS — R059 Cough, unspecified: Secondary | ICD-10-CM | POA: Insufficient documentation

## 2023-07-25 DIAGNOSIS — R197 Diarrhea, unspecified: Secondary | ICD-10-CM | POA: Diagnosis not present

## 2023-07-25 DIAGNOSIS — W19XXXA Unspecified fall, initial encounter: Secondary | ICD-10-CM | POA: Diagnosis not present

## 2023-07-25 LAB — COMPREHENSIVE METABOLIC PANEL WITH GFR
ALT: 19 U/L (ref 0–44)
AST: 13 U/L — ABNORMAL LOW (ref 15–41)
Albumin: 3.8 g/dL (ref 3.5–5.0)
Alkaline Phosphatase: 63 U/L (ref 38–126)
Anion gap: 9 (ref 5–15)
BUN: 18 mg/dL (ref 8–23)
CO2: 26 mmol/L (ref 22–32)
Calcium: 9 mg/dL (ref 8.9–10.3)
Chloride: 100 mmol/L (ref 98–111)
Creatinine, Ser: 0.91 mg/dL (ref 0.44–1.00)
GFR, Estimated: >60 mL/min (ref >60)
Glucose, Bld: 111 mg/dL — ABNORMAL HIGH (ref 70–99)
Potassium: 3.7 mmol/L (ref 3.5–5.1)
Sodium: 135 mmol/L (ref 135–145)
Total Bilirubin: 0.9 mg/dL (ref 0.0–1.2)
Total Protein: 6.5 g/dL (ref 6.5–8.1)

## 2023-07-25 LAB — CBC
HCT: 30.6 % — ABNORMAL LOW (ref 36.0–46.0)
Hemoglobin: 10.2 g/dL — ABNORMAL LOW (ref 12.0–15.0)
MCH: 31 pg (ref 26.0–34.0)
MCHC: 33.3 g/dL (ref 30.0–36.0)
MCV: 93 fL (ref 80.0–100.0)
Platelets: 280 10*3/uL (ref 150–400)
RBC: 3.29 MIL/uL — ABNORMAL LOW (ref 3.87–5.11)
RDW: 13.1 % (ref 11.5–15.5)
WBC: 9.6 10*3/uL (ref 4.0–10.5)
nRBC: 0 % (ref 0.0–0.2)

## 2023-07-25 LAB — LIPASE, BLOOD: Lipase: 27 U/L (ref 11–51)

## 2023-07-25 LAB — RESP PANEL BY RT-PCR (RSV, FLU A&B, COVID)  RVPGX2
Influenza A by PCR: NEGATIVE
Influenza B by PCR: NEGATIVE
Resp Syncytial Virus by PCR: NEGATIVE
SARS Coronavirus 2 by RT PCR: NEGATIVE

## 2023-07-25 LAB — URINALYSIS, W/ REFLEX TO CULTURE (INFECTION SUSPECTED)
Bacteria, UA: NONE SEEN
Bilirubin Urine: NEGATIVE
Glucose, UA: NEGATIVE mg/dL
Hgb urine dipstick: NEGATIVE
Ketones, ur: NEGATIVE mg/dL
Leukocytes,Ua: NEGATIVE
Nitrite: NEGATIVE
Protein, ur: NEGATIVE mg/dL
Specific Gravity, Urine: 1.01 (ref 1.005–1.030)
pH: 7 (ref 5.0–8.0)

## 2023-07-25 MED ORDER — ALBUTEROL SULFATE HFA 108 (90 BASE) MCG/ACT IN AERS: 2.0000 | INHALATION_SPRAY | RESPIRATORY_TRACT | Status: DC | PRN

## 2023-07-25 MED ORDER — ONDANSETRON 4 MG PO TBDP: 4.0000 mg | ORAL_TABLET | Freq: Three times a day (TID) | ORAL | 0 refills | Status: AC | PRN

## 2023-07-25 NOTE — ED Notes (Signed)
 Patient given food/drink for PO challenge.
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
## 2023-07-25 NOTE — Telephone Encounter (Signed)
  Chief Complaint: sob Symptoms: sob, frequent urination, confusion Frequency: x last night Pertinent Negatives: Patient denies chest pain Disposition: [x] ED /[] Urgent Care (no appt availability in office) / [] Appointment(In office/virtual)/ []  [facility]/ [] Home Care/ [] Refused Recommended Disposition /[] [facility]/ []  Follow-up with PCP Additional Notes: patient states that she is having difficulty breathing, having pain, frequent urination.  Daughter is there with patient and states patient is now shaking and is speaking different.  Daughter instructed to call 911. NT on line while daughter called 911 from her cell.    Copied from CRM [id number]. Topic: Clinical - Red Word Triage >> Jul 25, 2023  8:32 AM Clydene Darner H wrote: [facility] that prompted transfer to Nurse Triage: Patient called this morning stating she is having trouble breathing and is urinating every hour with a strong odor. She also reported her blood pressure reading was 161/60 Reason for Disposition  Sounds like a life-threatening emergency to the triager  Answer Assessment - Initial Assessment Questions 1. RESPIRATORY STATUS: "Describe your breathing?" (e.g., wheezing, shortness of breath, unable to speak, severe coughing)      sob 2. ONSET: "When did this breathing problem begin?"      Last night 3. PATTERN "Does the difficult breathing come and go, or has it been constant since it started?"      constant 4. SEVERITY: "How bad is your breathing?" (e.g., mild, moderate, severe)    - MILD: No SOB at rest, mild SOB with walking, speaks normally in sentences, can lie down, no retractions, pulse < 100.    - MODERATE: SOB at rest, SOB with minimal exertion and prefers to sit, cannot lie down flat, speaks in phrases, mild retractions, audible wheezing, pulse 100-120.    - SEVERE: Very SOB at rest, speaks in single words, struggling to breathe, sitting hunched forward, retractions, pulse > 120      moderate 5.  RECURRENT SYMPTOM: "Have you had difficulty breathing before?" If Yes, ask: "When was the last time?" and "What happened that time?"      Oct  Protocols used: Breathing Difficulty-A-AH

## 2023-07-25 NOTE — Progress Notes (Signed)
 Orthopedic Tech Progress Note Patient Details:  Erin Lopez Oct 21, 1950 [id number]  Ortho Devices Type of Ortho Device: Ace wrap, Sling immobilizer Ortho Device/Splint Location: re wrapped current splint on right arm. applied sling to right arm Ortho Device/Splint Interventions: Ordered, Adjustment   Post Interventions Patient Tolerated: Well Instructions Provided: Adjustment of device, Care of device  Leodis Rainwater 07/25/2023, 4:26 PM

## 2023-07-25 NOTE — ED Provider Notes (Signed)
 [place] EMERGENCY DEPARTMENT AT [facility] Provider Note   CSN: [id number] Arrival date & time: 07/25/23  0945     History  Chief Complaint  Patient presents with   Shortness of Breath   Nausea   Emesis   Diarrhea    Erin Lopez is a 73 y.o. female.   Shortness of Breath Associated symptoms: vomiting   Emesis Associated symptoms: diarrhea   Diarrhea Associated symptoms: vomiting   Patient presents with shortness of breath occasional cough.  Nausea vomiting diarrhea.  Has had for the last few days.  No definite sick contacts.  Has had increasing urination.  States there is incontinence.  That is somewhat chronic for her.  No definite fevers.  States has not been drinking as much.  States she feels weak.    Past Medical History:  Diagnosis Date   Acute encephalopathy 11/19/2017   Alcoholism in remission [facility])    states last alcohol 11 yrs ago   Anemia    Anxiety    Bipolar 1 disorder (HCC)    Admission for mania x 2 (most recent 11/2017)   Cervical spondylosis without myelopathy 12/19/2013   MRI 02/2014: multilevel DDD/spondylosis, not much change compared to prior MRI.  Pt not in favor of invasive therapy for her neck as of 04/2014.   Chronic constipation    Chronic renal insufficiency, stage 3 (moderate) (HCC)    COPD (chronic obstructive pulmonary disease) (HCC)    Moderate: anoro started 04/2017 by pulm, pt symptomatically improved and PFTs stable at f/u 06/2017.   Coronary artery spasm (HCC) 07/11/2018   nitrates=HA. Amlodipine =intol swelling.  Changed to coreg  09/03/18 by cardiologist.   Depression    Endometritis 05/2017   possible; empiric tx with Flagyl  by GYN.   Femoral neck fracture (HCC)    right, 01/28/23-->ORIF   Fibromyalgia    GERD (gastroesophageal reflux disease)    Hepatic steatosis 03/2019   u/s abd   Herpes zoster 08/07/2017   L scapular region   Hiatal hernia    History of adenomatous polyp of colon 04/2008; 08/2014   No  high grade dysplasia: recall 5 yrs (Dr. Honey Lusty, [facility])   History of basal cell carcinoma excision    NOSE   History of Helicobacter pylori infection 04/2008   +gastric biopsy (gastritis but no metaplasia, dysplasia, or malignancy identified)   History of kidney stones    History of TIA (transient ischemic attack)    secondary to HELLP syndrome 1994 post c/s--  residual memory loss   Hyperlipidemia    statin started after her NSTEMI: goal  LDL <70.   Hypertension    Ischemic cardiomyopathy 05/2018   Echo EF 45-50% in context of NSTEMI from CA spasm. 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 [name] 04/2017.   Memory loss    Abnl MRI brain and CT brain c/w chronic microvascular ischemia.  Repeat MRI brain 02/2014 stable (Dr. Levell Reach with no plan to f/u with neuro as of 04/2014)   NSTEMI (non-ST elevated myocardial infarction) (HCC) 05/2018   Echo EF 45-50% in context of NSTEMI from CA spasm.// Myoview  8/21: EF 66, no ischemia, low risk   Osteoarthritis of left wrist    STT and 1st [facility] jt   Osteoporosis 05/2010  2012 'penia; 06/2015 'porosis:  prolia .  DEXA 09/2017 T-score -2.2 femur neck. 11/2021 T score -2.2. Rpt 2 yrs   Pelvic floor dysfunction    [facility] (Dr. Dulcy Gibney)   Postmenopausal vaginal bleeding 05/2017   x 1 small episode; GYN attempted endo bx but unable to penetrate cervix due to severe cerv stenosis (due to postmenopausal state + hx of LEEP).  Endo u/s showed thin endo lining.  Per GYN---no evidence of endomet pathology on w/u---obs as of 07/2017.   Prediabetes 06/2016   Fasting gluc 105; HbA1c at that time was 6.0%.  A1c 6.2% 12/2017.  A1c 5.7% Feb 2023.   Recurrent kidney stones 08/2013   Left ureteral calculus: Perc  nephr--cystoscopy w/ureteroscopy + laser for stone removal.  Residual asymptomatic left renal nephrolithiasis <73mm present post-procedure.  Right sided hydronephrosis-persistent (on u/s)--urol ordered CT to further eval 08/2016: 1.6 cm nonobstructing stone lower pole left kidney, no hydro, plan for PCN extraction ([facility])   Sepsis (HCC)    UTI   Suprapubic discomfort 06/05/2015   TMJ (temporomandibular joint disorder)    USES  MOUTH GUARD   Toe fracture, left 12/2017   2nd toe prox phalanx (immobilization, Dr. Seward Dao)   Vitamin D deficiency 05/2011    Home Medications Prior to Admission medications   Medication Sig Start Date End Date Taking? Authorizing Provider  ondansetron  (ZOFRAN -ODT) 4 MG disintegrating tablet Take 1 tablet (4 mg total) by mouth every 8 (eight) hours as needed. 07/25/23  Yes Mozell Arias, MD  albuterol  (VENTOLIN  HFA) 108 (90 Base) MCG/ACT inhaler TAKE 2 PUFFS BY MOUTH EVERY 6 HOURS AS NEEDED FOR WHEEZE OR SHORTNESS OF BREATH Patient not taking: Reported on 07/13/2023 06/23/23   McGowen, Philip H, MD  ALPRAZolam  (XANAX ) 0.5 MG tablet TAKE 1 TABLET BY MOUTH THREE TIMES A DAY AS NEEDED FOR ANXIETY 05/12/23   McGowen, Minetta Aly, MD  amLODipine  (NORVASC ) 10 MG tablet Take 1 tablet (10 mg total) by mouth daily. 07/07/23   McGowen, Minetta Aly, MD  atorvastatin  (LIPITOR ) 80 MG tablet TAKE 1 TABLET BY MOUTH DAILY. Patient taking differently: Take 80 mg by mouth every evening. 10/12/22   McGowen, Minetta Aly, MD  buPROPion  (WELLBUTRIN  XL) 150 MG 24 hr tablet Take 150 mg by mouth daily.    [provider]  citalopram  (CELEXA ) 20 MG tablet Take 1 tablet (20 mg total) by mouth in the morning and at bedtime. 12/15/22   McGowen, Minetta Aly, MD  famotidine  (PEPCID ) 40 MG tablet Take 1 tablet (40 mg total) by mouth daily. 10/12/22   McGowen, Minetta Aly, MD  fluticasone  (FLONASE ) 50 MCG/ACT nasal spray SPRAY 2 SPRAYS INTO EACH NOSTRIL EVERY DAY 05/15/23   McGowen, Minetta Aly, MD  hydrochlorothiazide   (HYDRODIURIL ) 12.5 MG tablet 2 tabs po qd 06/02/23   McGowen, Minetta Aly, MD  HYDROcodone -acetaminophen  (NORCO/VICODIN) 5-325 MG tablet 1-2 tab po bid as needed for pain 06/16/23   McGowen, Minetta Aly, MD  HYDROcodone -acetaminophen  (NORCO/VICODIN) 5-325 MG tablet Take 1 tablet by mouth every 6 (six) hours as needed for moderate pain (pain score 4-6). 07/15/23   Marylynn Soho, MD  KLOR-CON  M20 20 MEQ tablet TAKE 1 TABLET BY MOUTH EVERY DAY 01/23/23   McGowen, Minetta Aly, MD  lactulose  (CHRONULAC ) 10 GM/15ML solution Take 30 mLs (20 g total) by mouth daily. 07/04/23   McGowen, Philip H, MD  lamoTRIgine  (LAMICTAL ) 100 MG tablet TAKE 3 TABLETS (300 MG TOTAL) BY MOUTH AT BEDTIME 06/23/23   McGowen, Minetta Aly, MD  loratadine  (CLARITIN ) 10 MG tablet Take 10 mg by mouth daily.    [provider]  losartan  (COZAAR ) 50 MG tablet Take 1 tablet (50 mg total) by mouth daily. 07/04/23   McGowen, Minetta Aly, MD  meloxicam  (MOBIC ) 15 MG tablet TAKE 1 TABLET BY MOUTH ONCE DAILY AS NEEDED FOR JOINT PAIN 05/15/23   McGowen, Minetta Aly, MD  metoprolol  succinate (TOPROL -XL) 100 MG 24 hr tablet 1 and 1/2 tabs p.o. daily.  Take with or immediately following a meal. 06/09/23   McGowen, Minetta Aly, MD  montelukast  (SINGULAIR ) 10 MG tablet TAKE 1 TABLET BY MOUTH EVERY DAY IN THE EVENING 05/15/23   McGowen, Minetta Aly, MD  naloxegol  oxalate (MOVANTIK ) 25 MG TABS tablet Take 1 tablet (25 mg total) by mouth daily. 07/04/23   McGowen, Minetta Aly, MD  nitroGLYCERIN  (NITROSTAT ) 0.4 MG SL tablet PLACE 1 TABLET (0.4 MG TOTAL) UNDER THE TONGUE EVERY 5 (FIVE) MINUTES X 3 DOSES AS NEEDED FOR CHEST PAIN. Patient not taking: Reported on 07/13/2023 07/03/23   McGowen, Philip H, MD  pantoprazole  (PROTONIX ) 40 MG tablet TAKE 1 TABLET (40 MG TOTAL) BY MOUTH DAILY. KEEP F/U APPT FOR ANY FURTHER REFILLS 04/10/23   Pyrtle, Amber Bail, MD  Tiotropium Bromide -Olodaterol (STIOLTO RESPIMAT ) 2.5-2.5 MCG/ACT AERS Inhale 2 each into the lungs daily. 07/07/23   Antonio Baumgarten, NP   zolpidem  (AMBIEN ) 10 MG tablet TAKE 1 TABLET BY MOUTH EVERYDAY AT BEDTIME 06/05/23   McGowen, Minetta Aly, MD      Allergies    Ceftin [cefuroxime axetil], Cipro  [ciprofloxacin  hcl], Risperdal  [risperidone ], Fosamax  [alendronate  sodium], Linzess [linaclotide], Morphine  and codeine, Prednisone , Roxicodone  [oxycodone ], and Seroquel  Carinus.Capri ]    Review of Systems   Review of Systems  Respiratory:  Positive for shortness of breath.   Gastrointestinal:  Positive for diarrhea and vomiting.    Physical Exam Updated Vital Signs BP 125/62   Pulse 80   Temp 98.3 F (36.8 C) (Oral)   Resp (!) 21   SpO2 96%  Physical Exam Vitals and nursing note reviewed.  HENT:     Head: Normocephalic.     Comments: Bruising to right face from fall 10 days ago. Pulmonary:     Breath sounds: No wheezing.  Chest:     Chest wall: No tenderness.  Abdominal:     Tenderness: There is no abdominal tenderness.     Comments: No specific abdominal tenderness but states she has to urinate with palpation.  Musculoskeletal:     Comments: Right forearm in sugar-tong splint.  Skin:    Capillary Refill: Capillary refill takes less than 2 seconds.  Neurological:     Mental Status: She is alert and oriented to person, place, and time.     ED Results / Procedures / Treatments   Labs (all labs ordered are listed, but only abnormal results are displayed) Labs Reviewed  COMPREHENSIVE METABOLIC PANEL WITH GFR - Abnormal; Notable for the following components:      Result Value   Glucose, Bld 111 (*)    AST 13 (*)    All other components within normal limits  CBC - Abnormal; Notable for the following components:   RBC 3.29 (*)    Hemoglobin 10.2 (*)    HCT 30.6 (*)    All other components within normal limits  RESP PANEL BY RT-PCR (RSV, FLU A&B, COVID)  RVPGX2  LIPASE, BLOOD  URINALYSIS, W/ REFLEX TO CULTURE (INFECTION SUSPECTED)    EKG EKG Interpretation Date/Time:  Tuesday  July 25 2023 14:44:05  EDT Ventricular Rate:  80 PR Interval:  199 QRS Duration:  87 QT Interval:  391 QTC Calculation: 451 R Axis:   60  Text Interpretation: Sinus rhythm Confirmed by Mozell Arias [phone]) on 07/25/2023 4:42:29 PM  Radiology DG Chest 2 View Result Date: 07/25/2023 CLINICAL DATA:  Shortness of breath EXAM: CHEST - 2 VIEW COMPARISON:  June 20 2023 FINDINGS: The heart size and mediastinal contours are within normal limits. Both lungs are clear. The visualized skeletal structures are unremarkable. IMPRESSION: No active cardiopulmonary disease. Mild COPD changes with hyperinflation both lung bases Electronically Signed   By: Fredrich Jefferson M.D.   On: 07/25/2023 12:15    Procedures Procedures    Medications Ordered in ED Medications  albuterol  (VENTOLIN  HFA) 108 (90 Base) MCG/ACT inhaler 2 puff (has no administration in time range)    ED Course/ Medical Decision Making/ A&P                                 Medical Decision Making Amount and/or Complexity of Data Reviewed Labs: ordered. Radiology: ordered.  Risk Prescription drug management.  Patient presents after shortness of breath nausea emesis diarrhea.  Feeling better the last couple days.  Differential diagnose includes cause such as viral infection, gastroenteritis.  Will get basic blood work.  Blood work so far is reassuring.  Will give fluid bolus and give antiemetics.  With urinary symptoms we will also get urinalysis.  On reexam patient feeling much better.  Blood work reassuring.  Has tolerated orals.  Eager to go home.  Will discharge.        Final Clinical Impression(s) / ED Diagnoses Final diagnoses:  Nausea vomiting and diarrhea    Rx / DC Orders ED Discharge Orders          Ordered    ondansetron  (ZOFRAN -ODT) 4 MG disintegrating tablet  Every 8 hours PRN        07/25/23 1953              Mozell Arias, MD 07/25/23 2316

## 2023-07-26 ENCOUNTER — Telehealth: Payer: Self-pay

## 2023-07-26 NOTE — Transitions of Care (Post Inpatient/ED Visit) (Signed)
 07/26/2023  Name: Erin Lopez MRN: [id number] DOB: 1950-11-26  Today's TOC FU Call Status: Today's TOC FU Call Status:: Successful TOC FU Call Completed TOC FU Call Complete Date: 07/26/23 Patient's Name and Date of Birth confirmed.  Transition Care Management Follow-up Telephone Call Date of Discharge: 07/25/23 Discharge Facility: [facility]) Type of Discharge: Emergency Department Reason for ED Visit: Other: (Nausea and Vomiting) How have you been since you were released from the hospital?: Better Any questions or concerns?: No  Items Reviewed: Did you receive and understand the discharge instructions provided?: Yes Medications obtained,verified, and reconciled?: Yes (Medications Reviewed) Any new allergies since your discharge?: No Dietary orders reviewed?: NA Do you have support at home?: No  Medications Reviewed Today: Medications Reviewed Today     Reviewed by Cathye Coca, LPN (Licensed Practical Nurse) on 07/26/23 at 1245  Med List Status: <None>   Medication Order Taking? Sig Documenting Provider Last Dose Status Informant  albuterol  (VENTOLIN  HFA) 108 (90 Base) MCG/ACT inhaler [id number] Yes TAKE 2 PUFFS BY MOUTH EVERY 6 HOURS AS NEEDED FOR WHEEZE OR SHORTNESS OF BREATH McGowen, Minetta Aly, MD Taking Active   ALPRAZolam  (XANAX ) 0.5 MG tablet [id number] Yes TAKE 1 TABLET BY MOUTH THREE TIMES A DAY AS NEEDED FOR ANXIETY McGowen, Minetta Aly, MD Taking Active   amLODipine  (NORVASC ) 10 MG tablet [id number] Yes Take 1 tablet (10 mg total) by mouth daily. McGowen, Philip H, MD Taking Active   atorvastatin  (LIPITOR ) 80 MG tablet [id number] Yes TAKE 1 TABLET BY MOUTH DAILY.  Patient taking differently: Take 80 mg by mouth every evening.   McGowen, Philip H, MD Taking Active Self, Pharmacy Records  buPROPion  (WELLBUTRIN  XL) 150 MG 24 hr tablet [id number] Yes Take 150 mg by mouth daily. [provider] Taking Active Self, Pharmacy Records           Med Note  Darlis Eisenmenger   Sat Jan 28, 2023  4:22 PM) Pt states she was reduced back down to 150mg  QD.  citalopram  (CELEXA ) 20 MG tablet [id number] Yes Take 1 tablet (20 mg total) by mouth in the morning and at bedtime. McGowen, Minetta Aly, MD Taking Active Self, Pharmacy Records           Med Note Val Garin, Alberteen Aloe   Fri Feb 17, 2023  1:07 PM) AT HOSPITALIZATION ON 01/28/23 SHE WAS STILL TAKING CITALOPRAM  TWICE A DAY AND WAS UNAWARE HER MOST RECENT FILLS WERE FOR 40MG  TABLETS TO BE TAKEN ONCE A DAY  famotidine  (PEPCID ) 40 MG tablet [id number] Yes Take 1 tablet (40 mg total) by mouth daily. Shelvia Dick, MD Taking Active Self, Pharmacy Records  fluticasone  (FLONASE ) 50 MCG/ACT nasal spray [id number] Yes SPRAY 2 SPRAYS INTO EACH NOSTRIL EVERY DAY McGowen, Minetta Aly, MD Taking Active   hydrochlorothiazide  (HYDRODIURIL ) 12.5 MG tablet [id number] Yes 2 tabs po qd McGowen, Philip H, MD Taking Active   HYDROcodone -acetaminophen  (NORCO/VICODIN) 5-325 MG tablet [id number] Yes 1-2 tab po bid as needed for pain McGowen, Minetta Aly, MD Taking Active   HYDROcodone -acetaminophen  (NORCO/VICODIN) 5-325 MG tablet [id number] Yes Take 1 tablet by mouth every 6 (six) hours as needed for moderate pain (pain score 4-6). Marylynn Soho, MD Taking Active   KLOR-CON  M20 20 MEQ tablet [id number] Yes TAKE 1 TABLET BY MOUTH EVERY DAY McGowen, Minetta Aly, MD Taking Active Self, Pharmacy Records  lactulose  (CHRONULAC ) 10 GM/15ML solution [id number] Yes Take 30 mLs (20 g total) by mouth daily. McGowen, Minetta Aly, MD Taking  Active   lamoTRIgine  (LAMICTAL ) 100 MG tablet [id number] Yes TAKE 3 TABLETS (300 MG TOTAL) BY MOUTH AT BEDTIME McGowen, Minetta Aly, MD Taking Active   loratadine  (CLARITIN ) 10 MG tablet [id number] Yes Take 10 mg by mouth daily. [provider] Taking Active Self  losartan  (COZAAR ) 50 MG tablet [id number] Yes Take 1 tablet (50 mg total) by mouth daily. Shelvia Dick, MD Taking Active   meloxicam  (MOBIC ) 15 MG tablet  [id number] Yes TAKE 1 TABLET BY MOUTH ONCE DAILY AS NEEDED FOR JOINT PAIN McGowen, Minetta Aly, MD Taking Active   metoprolol  succinate (TOPROL -XL) 100 MG 24 hr tablet [id number] Yes 1 and 1/2 tabs p.o. daily.  Take with or immediately following a meal. McGowen, Minetta Aly, MD Taking Active   montelukast  (SINGULAIR ) 10 MG tablet [id number] Yes TAKE 1 TABLET BY MOUTH EVERY DAY IN THE EVENING McGowen, Minetta Aly, MD Taking Active   naloxegol  oxalate (MOVANTIK ) 25 MG TABS tablet [id number] Yes Take 1 tablet (25 mg total) by mouth daily. McGowen, Minetta Aly, MD Taking Active   nitroGLYCERIN  (NITROSTAT ) 0.4 MG SL tablet [id number] Yes PLACE 1 TABLET (0.4 MG TOTAL) UNDER THE TONGUE EVERY 5 (FIVE) MINUTES X 3 DOSES AS NEEDED FOR CHEST PAIN. McGowen, Philip H, MD Taking Active   ondansetron  (ZOFRAN -ODT) 4 MG disintegrating tablet [id number] Yes Take 1 tablet (4 mg total) by mouth every 8 (eight) hours as needed. Mozell Arias, MD Taking Active   pantoprazole  (PROTONIX ) 40 MG tablet [id number] Yes TAKE 1 TABLET (40 MG TOTAL) BY MOUTH DAILY. KEEP F/U APPT FOR ANY FURTHER REFILLS Pyrtle, Amber Bail, MD Taking Active   Tiotropium Bromide -Olodaterol (STIOLTO RESPIMAT ) 2.5-2.5 MCG/ACT AERS [id number] Yes Inhale 2 each into the lungs daily. Antonio Baumgarten, NP Taking Active   zolpidem  (AMBIEN ) 10 MG tablet [id number] Yes TAKE 1 TABLET BY MOUTH EVERYDAY AT BEDTIME McGowen, Minetta Aly, MD Taking Active             Home Care and Equipment/Supplies: Were Home Health Services Ordered?: NA Any new equipment or medical supplies ordered?: NA  Functional Questionnaire: Do you need assistance with bathing/showering or dressing?: No Do you need assistance with meal preparation?: No Do you need assistance with eating?: No Do you have difficulty maintaining continence: Yes Do you need assistance with getting out of bed/getting out of a chair/moving?: No Do you have difficulty managing or taking your medications?: No  Follow up  appointments reviewed: PCP Follow-up appointment confirmed?: NA Specialist Hospital Follow-up appointment confirmed?: NA Do you need transportation to your follow-up appointment?: No Do you understand care options if your condition(s) worsen?: Yes-patient verbalized understanding    SIGNATURE [place], LPN [facility] Health Advisor [place] [facility] You Are. We Are. [facility] Direct Dial [phone]

## 2023-07-27 ENCOUNTER — Telehealth (HOSPITAL_COMMUNITY): Payer: Self-pay

## 2023-07-27 NOTE — Telephone Encounter (Signed)
 No response from pt in regards to pulmonary rehab. Closed referral.

## 2023-08-01 ENCOUNTER — Ambulatory Visit (INDEPENDENT_AMBULATORY_CARE_PROVIDER_SITE_OTHER): Admitting: Family Medicine

## 2023-08-01 ENCOUNTER — Encounter: Payer: Self-pay | Admitting: Family Medicine

## 2023-08-01 ENCOUNTER — Telehealth: Payer: Self-pay

## 2023-08-01 VITALS — BP 103/65 | HR 67 | Ht 68.0 in | Wt 172.4 lb

## 2023-08-01 DIAGNOSIS — W19XXXD Unspecified fall, subsequent encounter: Secondary | ICD-10-CM

## 2023-08-01 DIAGNOSIS — R42 Dizziness and giddiness: Secondary | ICD-10-CM | POA: Diagnosis not present

## 2023-08-01 DIAGNOSIS — F5101 Primary insomnia: Secondary | ICD-10-CM

## 2023-08-01 DIAGNOSIS — G8929 Other chronic pain: Secondary | ICD-10-CM

## 2023-08-01 DIAGNOSIS — S0083XD Contusion of other part of head, subsequent encounter: Secondary | ICD-10-CM

## 2023-08-01 DIAGNOSIS — Z4789 Encounter for other orthopedic aftercare: Secondary | ICD-10-CM | POA: Diagnosis not present

## 2023-08-01 DIAGNOSIS — I952 Hypotension due to drugs: Secondary | ICD-10-CM | POA: Diagnosis not present

## 2023-08-01 DIAGNOSIS — T50905A Adverse effect of unspecified drugs, medicaments and biological substances, initial encounter: Secondary | ICD-10-CM | POA: Diagnosis not present

## 2023-08-01 DIAGNOSIS — G5601 Carpal tunnel syndrome, right upper limb: Secondary | ICD-10-CM | POA: Diagnosis not present

## 2023-08-01 DIAGNOSIS — S6991XA Unspecified injury of right wrist, hand and finger(s), initial encounter: Secondary | ICD-10-CM | POA: Diagnosis not present

## 2023-08-01 DIAGNOSIS — M7542 Impingement syndrome of left shoulder: Secondary | ICD-10-CM

## 2023-08-01 DIAGNOSIS — R0989 Other specified symptoms and signs involving the circulatory and respiratory systems: Secondary | ICD-10-CM

## 2023-08-01 DIAGNOSIS — S52501A Unspecified fracture of the lower end of right radius, initial encounter for closed fracture: Secondary | ICD-10-CM | POA: Diagnosis not present

## 2023-08-01 NOTE — Progress Notes (Signed)
 OFFICE VISIT  08/01/2023  CC: No chief complaint on file.   Patient is a 73 y.o. female who presents for recheck headache, recent fall.  HPI: She had an ED visit for fall, sustained right wrist fracture and nasal bone fracture--> 07/15/2023. Her fall occurred when she got her foot caught trying to get out of the car. She has some pain in the nose and right side of face.  Says the swelling in bruising is going down.  She takes Tylenol  and sometimes Vicodin.  She then went to the emergency department on 07/25/2023 for nausea, vomiting, and diarrhea. Viral GE diagnosed.  She had recently started Movantik  and thinks maybe this was the cause.  She has had no recurrence of her symptoms since stopping Movantik .  Her blood pressures have been normal to low normal range at home and recently in the emergency department. No recent elevated blood pressures. She does have some orthostatic dizziness.  ROS as above, plus--> no fevers, no CP, no SOB, no wheezing, no cough, no rashes, no melena/hematochezia.  No polyuria or polydipsia.    No focal weakness, paresthesias, or tremors.  No acute vision or hearing abnormalities.  No dysuria or unusual/new urinary urgency or frequency.  No recent changes in lower legs. No palpitations.     Past Medical History:  Diagnosis Date   Acute encephalopathy 11/19/2017   Alcoholism in remission [facility])    states last alcohol 11 yrs ago   Anemia    Anxiety    Bipolar 1 disorder (HCC)    Admission for mania x 2 (most recent 11/2017)   Cervical spondylosis without myelopathy 12/19/2013   MRI 02/2014: multilevel DDD/spondylosis, not much change compared to prior MRI.  Pt not in favor of invasive therapy for her neck as of 04/2014.   Chronic constipation    Chronic renal insufficiency, stage 3 (moderate) (HCC)    COPD (chronic obstructive pulmonary disease) (HCC)    Moderate: anoro started 04/2017 by pulm, pt symptomatically improved and PFTs stable at f/u 06/2017.    Coronary artery spasm (HCC) 07/11/2018   nitrates=HA. Amlodipine =intol swelling.  Changed to coreg  09/03/18 by cardiologist.   Depression    Endometritis 05/2017   possible; empiric tx with Flagyl  by GYN.   Femoral neck fracture (HCC)    right, 01/28/23-->ORIF   Fibromyalgia    GERD (gastroesophageal reflux disease)    Hepatic steatosis 03/2019   u/s abd   Herpes zoster 08/07/2017   L scapular region   Hiatal hernia    History of adenomatous polyp of colon 04/2008; 08/2014   No high grade dysplasia: recall 5 yrs (Dr. Honey Lusty, [facility])   History of basal cell carcinoma excision    NOSE   History of Helicobacter pylori infection 04/2008   +gastric biopsy (gastritis but no metaplasia, dysplasia, or malignancy identified)   History of kidney stones    History of TIA (transient ischemic attack)    secondary to HELLP syndrome 1994 post c/s--  residual memory loss   Hyperlipidemia    statin started after her NSTEMI: goal  LDL <70.   Hypertension    Ischemic cardiomyopathy 05/2018   Echo EF 45-50% in context of NSTEMI from CA spasm. 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 [name] 04/2017.  Memory loss    Abnl MRI brain and CT brain c/w chronic microvascular ischemia.  Repeat MRI brain 02/2014 stable (Dr. Levell Reach with no plan to f/u with neuro as of 04/2014)   NSTEMI (non-ST elevated myocardial infarction) (HCC) 05/2018   Echo EF 45-50% in context of NSTEMI from CA spasm.// Myoview  8/21: EF 66, no ischemia, low risk   Osteoarthritis of left wrist    STT and 1st [facility] jt   Osteoporosis 05/2010   2012 'penia; 06/2015 'porosis:  prolia .  DEXA 09/2017 T-score -2.2 femur neck. 11/2021 T score -2.2. Rpt 2 yrs   Pelvic floor dysfunction    [facility] (Dr.  Dulcy Gibney)   Postmenopausal vaginal bleeding 05/2017   x 1 small episode; GYN attempted endo bx but unable to penetrate cervix due to severe cerv stenosis (due to postmenopausal state + hx of LEEP).  Endo u/s showed thin endo lining.  Per GYN---no evidence of endomet pathology on w/u---obs as of 07/2017.   Prediabetes 06/2016   Fasting gluc 105; HbA1c at that time was 6.0%.  A1c 6.2% 12/2017.  A1c 5.7% Feb 2023.   Recurrent kidney stones 08/2013   Left ureteral calculus: Perc nephr--cystoscopy w/ureteroscopy + laser for stone removal.  Residual asymptomatic left renal nephrolithiasis <22mm present post-procedure.  Right sided hydronephrosis-persistent (on u/s)--urol ordered CT to further eval 08/2016: 1.6 cm nonobstructing stone lower pole left kidney, no hydro, plan for PCN extraction ([facility])   Sepsis (HCC)    UTI   Suprapubic discomfort 06/05/2015   TMJ (temporomandibular joint disorder)    USES  MOUTH GUARD   Toe fracture, left 12/2017   2nd toe prox phalanx (immobilization, Dr. Seward Dao)   Vitamin D deficiency 05/2011    Past Surgical History:  Procedure Laterality Date   ABIs  11/29/2019   NORMAL   ANTERIOR CERVICAL DECOMP/DISCECTOMY FUSION  06/30/2009   C3 -- C5  AND EXPLORATION OF FUSION C5-7 W/  PLATE REMOVAL   ARTHRODESIS METATARSALPHALANGEAL JOINT (MTPJ) Left 07/08/2021   Procedure: Left hallux metatarsophalangeal joint arthrodesis;  Surgeon: Amada Backer, MD;  Location: [facility];  Service: Orthopedics;  Laterality: Left;   BACK SURGERY     BUNIONECTOMY Left    CARDIOVASCULAR STRESS TEST  12/03/2019   myoc perf im: NORMAL   CERVICAL FUSION  2002   anterior C5 -- C7   CERVICAL SPINE SURGERY  08/27/1999     C6 -- T1  LAMINECTOMY/  DISKECTOMY   CESAREAN SECTION  1994   CHOLECYSTECTOMY N/A 03/06/2020   Procedure: LAPAROSCOPIC CHOLECYSTECTOMY;  Surgeon: Junie Olds, MD;  Location: [facility];  Service: General;  Laterality: N/A;   COLONOSCOPY W/ POLYPECTOMY   04/2008;08/2014   2016 tubular adenoma x 1; +diverticulosis and int/ext hemorrhoids.  06/2020 adenoma, recall 5 yrs.   CYSTOSCOPY WITH URETEROSCOPY AND STENT PLACEMENT Left 08/28/2013   Procedure: CYSTOSCOPY, RGP,  WITH URETEROSCOPY AND STENT REMOVAL;  Surgeon: Livingston Rigg, MD;  Location: [facility];  Service: Urology;  Laterality: Left;   DEXA  09/2017   T-score -2.2 femur neck: improved compared to 06/2015.  2021 DEXA T score -2.3.  11/2021 DEXA T score -2.2.   ESOPHAGOGASTRODUODENOSCOPY  2010; 08/2014   2016 +Candidal esophagitis; Mild chronic gastritis w/intestinal metaplasia--NEG H pylori, neg for eosinophilic esoph. 06/2020 esoph candidiasis (diflucan  x 20d), o/w normal.   HAMMER TOE SURGERY Left 07/08/2021   Procedure: Second metatarsal head excision; revision Second hammertoe correction; 2-5 percutaneus flexor tenotomies;  Surgeon: Rosebud Confer,  Autry Legions, MD;  Location: [facility];  Service: Orthopedics;  Laterality: Left;   HARDWARE REMOVAL Left 07/08/2021   Procedure: Removal of deep implants from hallux proximal phalanx and First and Second metatarsals;  Surgeon: Amada Backer, MD;  Location: [facility];  Service: Orthopedics;  Laterality: Left;   HIP PINNING,CANNULATED Right 01/28/2023   Procedure: PERCUTANEOUS FIXATION OF RIGHT FEMORAL NECK;  Surgeon: Janeth Medicus, MD;  Location: [facility] OR;  Service: Orthopedics;  Laterality: Right;   HOLMIUM LASER APPLICATION Left 08/28/2013   Procedure: HOLMIUM LASER APPLICATION;  Surgeon: Livingston Rigg, MD;  Location: [facility];  Service: Urology;  Laterality: Left;   LEFT HEART CATH AND CORONARY ANGIOGRAPHY N/A 05/15/2018   Evidence for spasm in distal LAD.  Mod RCA atherosclerosis, o/w no CAD.  Procedure: LEFT HEART CATH AND CORONARY ANGIOGRAPHY;  Surgeon: Arleen Lacer, MD;  Location: [facility] INVASIVE CV LAB;  Service: Cardiovascular;  Laterality: N/A;   NEGATIVE SLEEP STUDY  04/01/2007    NEPHROLITHOTOMY Left 08/05/2013   Procedure: NEPHROLITHOTOMY PERCUTANEOUS;  Surgeon: Livingston Rigg, MD;  Location: [facility];  Service: Urology;  Laterality: Left;   POLYSOMNOGRAM  01/2019   NORMAL   POLYSOMNOGRAPHY  10/25/2018   Dr. Ivana Maris due to pt inadequat sleep time (83 min).   TONSILLECTOMY AND ADENOIDECTOMY  1975   TOTAL KNEE ARTHROPLASTY Right 2003   TOTAL KNEE REVISION Right 01/19/2022   Procedure: Right knee polyethylene revision;  Surgeon: Liliane Rei, MD;  Location: [facility];  Service: Orthopedics;  Laterality: Right;   TRANSTHORACIC ECHOCARDIOGRAM  05/2018; 12/03/2019   (after MI secondary to arterial spasm): EF 45-50%; severe hypokinesis of apical anterolateral, inferolateral , anterior, and inferior LV myocardium. 11/2019 EF 55-60%, valves fine   ULTRASOUND EXAM,PELVIC COMPLETE ([facility] HX)  06/25/2015   NORMAL (done by GYN)    Outpatient Medications Prior to Visit  Medication Sig Dispense Refill   albuterol  (VENTOLIN  HFA) 108 (90 Base) MCG/ACT inhaler TAKE 2 PUFFS BY MOUTH EVERY 6 HOURS AS NEEDED FOR WHEEZE OR SHORTNESS OF BREATH 18 each 1   ALPRAZolam  (XANAX ) 0.5 MG tablet TAKE 1 TABLET BY MOUTH THREE TIMES A DAY AS NEEDED FOR ANXIETY 90 tablet 5   atorvastatin  (LIPITOR ) 80 MG tablet TAKE 1 TABLET BY MOUTH DAILY. (Patient taking differently: Take 80 mg by mouth every evening.) 90 tablet 3   buPROPion  (WELLBUTRIN  XL) 150 MG 24 hr tablet Take 150 mg by mouth daily.     citalopram  (CELEXA ) 20 MG tablet Take 1 tablet (20 mg total) by mouth in the morning and at bedtime.     famotidine  (PEPCID ) 40 MG tablet Take 1 tablet (40 mg total) by mouth daily. 90 tablet 3   fluticasone  (FLONASE ) 50 MCG/ACT nasal spray SPRAY 2 SPRAYS INTO EACH NOSTRIL EVERY DAY 48 mL 1   hydrochlorothiazide  (HYDRODIURIL ) 12.5 MG tablet 2 tabs po qd     HYDROcodone -acetaminophen  (NORCO/VICODIN) 5-325 MG tablet 1-2 tab po bid as needed for pain 90 tablet 0   KLOR-CON  M20 20 MEQ tablet TAKE 1  TABLET BY MOUTH EVERY DAY 90 tablet 1   lactulose  (CHRONULAC ) 10 GM/15ML solution Take 30 mLs (20 g total) by mouth daily. 236 mL 3   lamoTRIgine  (LAMICTAL ) 100 MG tablet TAKE 3 TABLETS (300 MG TOTAL) BY MOUTH AT BEDTIME 270 tablet 2   loratadine  (CLARITIN ) 10 MG tablet Take 10 mg by mouth daily.     losartan  (COZAAR ) 50 MG tablet Take 1  tablet (50 mg total) by mouth daily.     meloxicam  (MOBIC ) 15 MG tablet TAKE 1 TABLET BY MOUTH ONCE DAILY AS NEEDED FOR JOINT PAIN 30 tablet 2   metoprolol  succinate (TOPROL -XL) 100 MG 24 hr tablet 1 and 1/2 tabs p.o. daily.  Take with or immediately following a meal.     montelukast  (SINGULAIR ) 10 MG tablet TAKE 1 TABLET BY MOUTH EVERY DAY IN THE EVENING 90 tablet 1   naloxegol  oxalate (MOVANTIK ) 25 MG TABS tablet Take 1 tablet (25 mg total) by mouth daily. 30 tablet 5   ondansetron  (ZOFRAN -ODT) 4 MG disintegrating tablet Take 1 tablet (4 mg total) by mouth every 8 (eight) hours as needed. 8 tablet 0   pantoprazole  (PROTONIX ) 40 MG tablet TAKE 1 TABLET (40 MG TOTAL) BY MOUTH DAILY. KEEP F/U APPT FOR ANY FURTHER REFILLS 90 tablet 1   Tiotropium Bromide -Olodaterol (STIOLTO RESPIMAT ) 2.5-2.5 MCG/ACT AERS Inhale 2 each into the lungs daily. 4 g 5   amLODipine  (NORVASC ) 10 MG tablet Take 1 tablet (10 mg total) by mouth daily. 30 tablet 0   HYDROcodone -acetaminophen  (NORCO/VICODIN) 5-325 MG tablet Take 1 tablet by mouth every 6 (six) hours as needed for moderate pain (pain score 4-6). 15 tablet 0   zolpidem  (AMBIEN ) 10 MG tablet TAKE 1 TABLET BY MOUTH EVERYDAY AT BEDTIME 90 tablet 1   nitroGLYCERIN  (NITROSTAT ) 0.4 MG SL tablet PLACE 1 TABLET (0.4 MG TOTAL) UNDER THE TONGUE EVERY 5 (FIVE) MINUTES X 3 DOSES AS NEEDED FOR CHEST PAIN. (Patient not taking: Reported on 08/01/2023) 25 tablet 1   No facility-administered medications prior to visit.    Allergies  Allergen Reactions   Ceftin [Cefuroxime Axetil] Anaphylaxis and Swelling    Swelling of eyes and tongue   Cipro   [Ciprofloxacin  Hcl] Anaphylaxis and Swelling   Risperdal  [Risperidone ] Palpitations    Generalized weakness Malaise  Hypertension   Fosamax  [Alendronate  Sodium] Diarrhea   Linzess [Linaclotide] Diarrhea   Morphine  And Codeine Other (See Comments)    Hallucinations Disorientation    Prednisone  Other (See Comments)    Hyperactivity   Roxicodone  [Oxycodone ] Other (See Comments)    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     Review of Systems  As per HPI  PE:    08/01/2023    8:23 AM 07/25/2023    8:00 PM 07/25/2023    7:30 PM  Vitals with BMI  Height 5\' 8"     Weight 172 lbs 6 oz    BMI 26.22    Systolic 103 125 578  Diastolic 65 62 80  Pulse 67 80 85     Physical Exam  Gen: Alert, well appearing.  Patient is oriented to person, place, time, and situation. AFFECT: pleasant, lucid thought and speech. R infraorbital swelling and ecchymoses CV: RRR, no m/r/g.   LUNGS: CTA bilat, nonlabored resps, good aeration in all lung fields. EXT: no clubbing or cyanosis.  no edema.    LABS:  Last CBC Lab Results  Component Value Date   WBC 9.6 07/25/2023   HGB 10.2 (L) 07/25/2023   HCT 30.6 (L) 07/25/2023   MCV 93.0 07/25/2023   MCH 31.0 07/25/2023   RDW 13.1 07/25/2023   PLT 280 07/25/2023   Lab Results  Component Value Date   IRON 64 02/10/2023   TIBC 256 02/10/2023   FERRITIN 234 02/10/2023   Last metabolic panel Lab Results  Component Value Date   GLUCOSE 111 (  H) 07/25/2023   NA 135 07/25/2023   K 3.7 07/25/2023   CL 100 07/25/2023   CO2 26 07/25/2023   BUN 18 07/25/2023   CREATININE 0.91 07/25/2023   GFRNONAA >60 07/25/2023   CALCIUM  9.0 07/25/2023   PROT 6.5 07/25/2023   ALBUMIN  3.8 07/25/2023   BILITOT 0.9 07/25/2023   ALKPHOS 63 07/25/2023   AST 13 (L) 07/25/2023   ALT 19 07/25/2023   ANIONGAP 9 07/25/2023   Lab Results  Component Value Date   LIPASE 27 07/25/2023   IMPRESSION AND  PLAN:  #1 fall, facial contusion and right radius fracture sustained. Facial pain improving gradually.  #2 hypertension, labile.  Lately it has been soft.  She is having some orthostatic dizziness. Instructions:   Stop amlodipine .  Okay to continue HCTZ 25 mg a day, Toprol -XL 150 mg a day, and losartan  50 mg a day. Monitor your blood pressure and heart rate at home and write the numbers down for review with me at your follow up visit. Electrolytes and creatinine were normal 1 week ago in the emergency department.  #3 insomnia.  I think she is intermittently having adverse effects from Ambien --> cognitive, mood, balance. Stop ambien . She is okay to take alprazolam  around bedtime to help her rest.  #4 intra-articular distal radius fracture (right)-->Has f/u with [facility] this afternoon. She will be getting surgery in the near future.  An After Visit Summary was printed and given to the patient.  FOLLOW UP: Return for 10-14d f/u bp, dizziness.  Signed:  Arletha Lady, MD           08/01/2023

## 2023-08-01 NOTE — Telephone Encounter (Signed)
 Pt provided the number for [employer] [phone], for personal care d/t right radius fracture

## 2023-08-01 NOTE — Patient Instructions (Signed)
 Stop amlodipine . Monitor your blood pressure and heart rate at home and write the numbers down for review with me at your follow up visit. Stop ambien .  It is ok to take your alprazolam  at bedtime.

## 2023-08-02 ENCOUNTER — Other Ambulatory Visit: Payer: Self-pay | Admitting: Family Medicine

## 2023-08-02 NOTE — Telephone Encounter (Signed)
 Unable to speak with someone directly regarding patient's request, will follow up with patient for more information.

## 2023-08-03 ENCOUNTER — Telehealth: Payer: Self-pay

## 2023-08-03 NOTE — Telephone Encounter (Signed)
 Home health order pending but order must have SN, OT or PT need. Please confirm dx for referral

## 2023-08-03 NOTE — Telephone Encounter (Unsigned)
 Copied from CRM [id number]. Topic: General - Other >> Aug 01, 2023  5:06 PM Erin Lopez B wrote: Reason for CRM: Reason for CRM: Patient broke her wrist and is scheduled for surgery Friday, May 2. [facility] is doing the surgery and need clearance from Pulm Eulas Hick).

## 2023-08-03 NOTE — Telephone Encounter (Signed)
 Called and spoke with the pt  She is needing pulmonary clearance for wrist surgery after she had recent injury to her wrist  She states that they plan to do nerve block  Please advise if you can addend last visit to give risk assessment, thanks!

## 2023-08-03 NOTE — Telephone Encounter (Signed)
 Primary Information  Source  Khadesia, Wadzinski "Caryl Clas" (Patient)   Subject  [name] "Caryl Clas" (Patient)   Topic  General - Other    Communication  Reason for CRM: Patient needs home health orders due to a broken arm. She would like for this to be expedited.    Okay for [facility] order? Please specify dx and if pt needs SN, OT, PT

## 2023-08-03 NOTE — Telephone Encounter (Signed)
 I am not sure if she qualifies for it.  She actually just needs some home health assistance (says it is very difficult to do her usual activities of daily living with the arm in a cast).  She does not need PT, OT, and she does not have a skilled nursing need. Can we enter [facility] order and just state "assess needs"?

## 2023-08-03 NOTE — Telephone Encounter (Signed)
   Pre-operative Risk Assessment    Patient Name: Erin Lopez  DOB: [birth year] MRN: [id number]   Date of last office visit: 11/04/22 Gaylyn Keas, MED Date of next office visit: NONE   Request for Surgical Clearance    Procedure:   RIGHT WRIST OPEN REDUCTION INTERNAL FIXATION AND CARPAL TUNNEL RELEASE  Date of Surgery:  Clearance TBD                                Surgeon:  DR Ltanya Rummer Surgeon's Group or Practice Name:  [facility] Phone number:  [phone] Fax number:  [phone] ATTN: MEGAN DAVIS   Type of Clearance Requested:   - Medical    Type of Anesthesia:  MAC AND BLOCK   Additional requests/questions:    SignedCollin Deal   08/03/2023, 4:25 PM

## 2023-08-03 NOTE — Addendum Note (Signed)
 Addended by: Terris Fickle D on: 08/03/2023 03:02 PM   Modules accepted: Orders

## 2023-08-04 ENCOUNTER — Ambulatory Visit: Attending: Cardiology

## 2023-08-04 ENCOUNTER — Other Ambulatory Visit: Payer: Self-pay

## 2023-08-04 ENCOUNTER — Telehealth: Payer: Self-pay | Admitting: *Deleted

## 2023-08-04 DIAGNOSIS — Z01810 Encounter for preprocedural cardiovascular examination: Secondary | ICD-10-CM | POA: Diagnosis not present

## 2023-08-04 NOTE — Telephone Encounter (Signed)
 Okay, maybe we can get PT and OT evaluation since her arm is fractured and she has to do certain things differently with the arms. Please use the arm fracture diagnosis that I recently put in her chart at her most recent office visit.

## 2023-08-04 NOTE — Telephone Encounter (Signed)
 Requesting: Hydrocodone  Contract: N/A UDS: N/A Last Visit: 08/01/23 Next Visit: 08/11/23 Last Refill: 06/16/23 (90,0)  Please Advise. Med pending

## 2023-08-04 NOTE — Addendum Note (Signed)
 Addended by: Terris Fickle D on: 08/04/2023 04:27 PM   Modules accepted: Orders

## 2023-08-04 NOTE — Telephone Encounter (Signed)
   Name: Erin Lopez  DOB: 1950/04/07  MRN: [id number]  Primary Cardiologist: Gaylyn Keas, MD   Preoperative team, please contact this patient and set up a phone call appointment for further preoperative risk assessment. Please obtain consent and complete medication review. Thank you for your help.  I confirm that guidance regarding antiplatelet and oral anticoagulation therapy has been completed and, if necessary, noted below.  I also confirmed the patient resides in the state of [place] . As per [facility] [employer] telemedicine laws, the patient must reside in the state in which the provider is licensed.   Ava Boatman, NP 08/04/2023, 8:43 AM [facility]

## 2023-08-04 NOTE — Progress Notes (Signed)
 Virtual Visit via Telephone Note   Because of Erin Lopez co-morbid illnesses, she is at least at moderate risk for complications without adequate follow up.  This format is felt to be most appropriate for this patient at this time.  Due to technical limitations with video connection (technology), today's appointment will be conducted as an audio only telehealth visit, and Erin Lopez verbally agreed to proceed in this manner.   All issues noted in this document were discussed and addressed.  No physical exam could be performed with this format.  Evaluation Performed:  Preoperative cardiovascular risk assessment _____________   Date:  08/04/2023   Patient ID:  Erin Lopez, DOB 1950/09/28, MRN [id number] Patient Location:  Home Provider location:   Office  Primary Care Provider:  Shelvia Dick, MD Primary Cardiologist:  Gaylyn Keas, MD  Chief Complaint / Patient Profile  73 y.o. y/o female with a h/o AF CAD with NSTEMI in 05/2018 with cath showing coronary artery vasospasm with distal LAD lesion at 60% and proximal to mid RCA lesion 45%, hypertension, hyperlipidemia, COPD and GERD.  Who is pending right wrist open reduction internal fixation and carpal tunnel release and presents today for telephonic preoperative cardiovascular risk assessment. History of Present Illness  Erin Lopez is a 73 y.o. female who presents via audio/video conferencing for a telehealth visit today.  Pt was last seen in cardiology clinic on 11/30/2022 by Dr. Micael Adas.  At that time Erin Lopez was doing well.  The patient is now pending procedure as outlined above. Since her last visit, she has remained stable from a cardiac standpoint. Today she denies chest pain, shortness of breath, lower extremity edema, fatigue, palpitations, melena, hematuria, hemoptysis, diaphoresis, weakness, presyncope, syncope, orthopnea, and PND.  Past Medical History    Past Medical History:  Diagnosis  Date   Acute encephalopathy 11/19/2017   Alcoholism in remission [facility])    states last alcohol 11 yrs ago   Anemia    Anxiety    Bipolar 1 disorder (HCC)    Admission for mania x 2 (most recent 11/2017)   Cervical spondylosis without myelopathy 12/19/2013   MRI 02/2014: multilevel DDD/spondylosis, not much change compared to prior MRI.  Pt not in favor of invasive therapy for her neck as of 04/2014.   Chronic constipation    Chronic renal insufficiency, stage 3 (moderate) (HCC)    COPD (chronic obstructive pulmonary disease) (HCC)    Moderate: anoro started 04/2017 by pulm, pt symptomatically improved and PFTs stable at f/u 06/2017.   Coronary artery spasm (HCC) 07/11/2018   nitrates=HA. Amlodipine =intol swelling.  Changed to coreg  09/03/18 by cardiologist.   Depression    Endometritis 05/2017   possible; empiric tx with Flagyl  by GYN.   Femoral neck fracture (HCC)    right, 01/28/23-->ORIF   Fibromyalgia    GERD (gastroesophageal reflux disease)    Hepatic steatosis 03/2019   u/s abd   Herpes zoster 08/07/2017   L scapular region   Hiatal hernia    History of adenomatous polyp of colon 04/2008; 08/2014   No high grade dysplasia: recall 5 yrs (Dr. Honey Lusty, [facility])   History of basal cell carcinoma excision    NOSE   History of Helicobacter pylori infection 04/2008   +gastric biopsy (gastritis but no metaplasia, dysplasia, or malignancy identified)   History of kidney stones    History of TIA (transient ischemic attack)    secondary to HELLP syndrome 1994 post c/s--  residual memory loss   Hyperlipidemia    statin started after her NSTEMI: goal  LDL <70.   Hypertension    Ischemic cardiomyopathy 05/2018   Echo EF 45-50% in context of NSTEMI from CA spasm. // Echo 8/21: 55-60, mild LVH, normal RVSF, RVSP 40, trivial MR   Left foot pain    Hallux deformity+adhesive capsulitis+ hammertoe:  Severe structural bunion deformity with hallux interphalangeus and severe arthritis of the  second MPJ left foot--surgical repair/osteotomies by Dr. [name] 04/2017.   Memory loss    Abnl MRI brain and CT brain c/w chronic microvascular ischemia.  Repeat MRI brain 02/2014 stable (Dr. Levell Reach with no plan to f/u with neuro as of 04/2014)   NSTEMI (non-ST elevated myocardial infarction) (HCC) 05/2018   Echo EF 45-50% in context of NSTEMI from CA spasm.// Myoview  8/21: EF 66, no ischemia, low risk   Osteoarthritis of left wrist    STT and 1st [facility] jt   Osteoporosis 05/2010   2012 'penia; 06/2015 'porosis:  prolia .  DEXA 09/2017 T-score -2.2 femur neck. 11/2021 T score -2.2. Rpt 2 yrs   Pelvic floor dysfunction    [facility] (Dr. Dulcy Gibney)   Postmenopausal vaginal bleeding 05/2017   x 1 small episode; GYN attempted endo bx but unable to penetrate cervix due to severe cerv stenosis (due to postmenopausal state + hx of LEEP).  Endo u/s showed thin endo lining.  Per GYN---no evidence of endomet pathology on w/u---obs as of 07/2017.   Prediabetes 06/2016   Fasting gluc 105; HbA1c at that time was 6.0%.  A1c 6.2% 12/2017.  A1c 5.7% Feb 2023.   Recurrent kidney stones 08/2013   Left ureteral calculus: Perc nephr--cystoscopy w/ureteroscopy + laser for stone removal.  Residual asymptomatic left renal nephrolithiasis <42mm present post-procedure.  Right sided hydronephrosis-persistent (on u/s)--urol ordered CT to further eval 08/2016: 1.6 cm nonobstructing stone lower pole left kidney, no hydro, plan for PCN extraction ([facility])   Sepsis (HCC)    UTI   Suprapubic discomfort 06/05/2015   TMJ (temporomandibular joint disorder)    USES  MOUTH GUARD   Toe fracture, left 12/2017   2nd toe prox phalanx (immobilization, Dr. Seward Dao)   Vitamin D deficiency 05/2011   Past Surgical History:  Procedure Laterality Date   ABIs  11/29/2019   NORMAL   ANTERIOR CERVICAL DECOMP/DISCECTOMY FUSION  06/30/2009   C3 -- C5  AND EXPLORATION OF FUSION C5-7 W/  PLATE REMOVAL   ARTHRODESIS METATARSALPHALANGEAL JOINT  (MTPJ) Left 07/08/2021   Procedure: Left hallux metatarsophalangeal joint arthrodesis;  Surgeon: Amada Backer, MD;  Location: [facility];  Service: Orthopedics;  Laterality: Left;   BACK SURGERY     BUNIONECTOMY Left    CARDIOVASCULAR STRESS TEST  12/03/2019   myoc perf im: NORMAL   CERVICAL FUSION  2002   anterior C5 -- C7   CERVICAL SPINE SURGERY  08/27/1999     C6 -- T1  LAMINECTOMY/  DISKECTOMY   CESAREAN SECTION  1994   CHOLECYSTECTOMY N/A 03/06/2020   Procedure: LAPAROSCOPIC CHOLECYSTECTOMY;  Surgeon: Junie Olds, MD;  Location: [facility];  Service: General;  Laterality: N/A;   COLONOSCOPY W/ POLYPECTOMY  04/2008;08/2014   2016 tubular adenoma x 1; +diverticulosis and int/ext hemorrhoids.  06/2020 adenoma, recall 5 yrs.   CYSTOSCOPY WITH URETEROSCOPY AND STENT PLACEMENT Left 08/28/2013   Procedure: CYSTOSCOPY, RGP,  WITH URETEROSCOPY AND STENT REMOVAL;  Surgeon: Livingston Rigg, MD;  Location: [facility];  Service:  Urology;  Laterality: Left;   DEXA  09/2017   T-score -2.2 femur neck: improved compared to 06/2015.  2021 DEXA T score -2.3.  11/2021 DEXA T score -2.2.   ESOPHAGOGASTRODUODENOSCOPY  2010; 08/2014   2016 +Candidal esophagitis; Mild chronic gastritis w/intestinal metaplasia--NEG H pylori, neg for eosinophilic esoph. 06/2020 esoph candidiasis (diflucan  x 20d), o/w normal.   HAMMER TOE SURGERY Left 07/08/2021   Procedure: Second metatarsal head excision; revision Second hammertoe correction; 2-5 percutaneus flexor tenotomies;  Surgeon: Amada Backer, MD;  Location: [facility];  Service: Orthopedics;  Laterality: Left;   HARDWARE REMOVAL Left 07/08/2021   Procedure: Removal of deep implants from hallux proximal phalanx and First and Second metatarsals;  Surgeon: Amada Backer, MD;  Location: [facility];  Service: Orthopedics;  Laterality: Left;   HIP PINNING,CANNULATED Right 01/28/2023   Procedure: PERCUTANEOUS FIXATION  OF RIGHT FEMORAL NECK;  Surgeon: Janeth Medicus, MD;  Location: [facility];  Service: Orthopedics;  Laterality: Right;   HOLMIUM LASER APPLICATION Left 08/28/2013   Procedure: HOLMIUM LASER APPLICATION;  Surgeon: Livingston Rigg, MD;  Location: [facility];  Service: Urology;  Laterality: Left;   LEFT HEART CATH AND CORONARY ANGIOGRAPHY N/A 05/15/2018   Evidence for spasm in distal LAD.  Mod RCA atherosclerosis, o/w no CAD.  Procedure: LEFT HEART CATH AND CORONARY ANGIOGRAPHY;  Surgeon: Arleen Lacer, MD;  Location: [facility];  Service: Cardiovascular;  Laterality: N/A;   NEGATIVE SLEEP STUDY  04/01/2007   NEPHROLITHOTOMY Left 08/05/2013   Procedure: NEPHROLITHOTOMY PERCUTANEOUS;  Surgeon: Livingston Rigg, MD;  Location: [facility];  Service: Urology;  Laterality: Left;   POLYSOMNOGRAM  01/2019   NORMAL   POLYSOMNOGRAPHY  10/25/2018   Dr. Ivana Maris due to pt inadequat sleep time (83 min).   TONSILLECTOMY AND ADENOIDECTOMY  1975   TOTAL KNEE ARTHROPLASTY Right 2003   TOTAL KNEE REVISION Right 01/19/2022   Procedure: Right knee polyethylene revision;  Surgeon: Liliane Rei, MD;  Location: [facility];  Service: Orthopedics;  Laterality: Right;   TRANSTHORACIC ECHOCARDIOGRAM  05/2018; 12/03/2019   (after MI secondary to arterial spasm): EF 45-50%; severe hypokinesis of apical anterolateral, inferolateral , anterior, and inferior LV myocardium. 11/2019 EF 55-60%, valves fine   ULTRASOUND EXAM,PELVIC COMPLETE ([facility] HX)  06/25/2015   NORMAL (done by GYN)   Allergies Allergies  Allergen Reactions   Ceftin [Cefuroxime Axetil] Anaphylaxis and Swelling    Swelling of eyes and tongue   Cipro  [Ciprofloxacin  Hcl] Anaphylaxis and Swelling   Risperdal  [Risperidone ] Palpitations    Generalized weakness Malaise  Hypertension   Fosamax  [Alendronate  Sodium] Diarrhea   Linzess [Linaclotide] Diarrhea   Morphine  And Codeine Other (See Comments)     Hallucinations Disorientation    Prednisone  Other (See Comments)    Hyperactivity   Roxicodone  [Oxycodone ] Other (See Comments)    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    Home Medications    Prior to Admission medications   Medication Sig Start Date End Date Taking? Authorizing Provider  albuterol  (VENTOLIN  HFA) 108 (90 Base) MCG/ACT inhaler TAKE 2 PUFFS BY MOUTH EVERY 6 HOURS AS NEEDED FOR WHEEZE OR SHORTNESS OF BREATH 06/23/23   McGowen, Minetta Aly, MD  ALPRAZolam  (XANAX ) 0.5 MG tablet TAKE 1 TABLET BY MOUTH THREE TIMES A DAY AS NEEDED FOR ANXIETY 05/12/23   McGowen, Minetta Aly, MD  atorvastatin  (LIPITOR ) 80 MG tablet TAKE 1 TABLET BY  MOUTH EVERY DAY 08/03/23   McGowen, Minetta Aly, MD  buPROPion  (WELLBUTRIN  XL) 300 MG 24 hr tablet TAKE 1 TABLET BY MOUTH EVERY DAY 08/03/23   McGowen, Minetta Aly, MD  citalopram  (CELEXA ) 20 MG tablet Take 1 tablet (20 mg total) by mouth in the morning and at bedtime. 12/15/22   McGowen, Minetta Aly, MD  citalopram  (CELEXA ) 40 MG tablet TAKE 1 TABLET BY MOUTH EVERY DAY 08/03/23   McGowen, Minetta Aly, MD  famotidine  (PEPCID ) 40 MG tablet TAKE 1 TABLET BY MOUTH EVERY DAY 08/03/23   McGowen, Minetta Aly, MD  fluticasone  (FLONASE ) 50 MCG/ACT nasal spray SPRAY 2 SPRAYS INTO EACH NOSTRIL EVERY DAY 05/15/23   McGowen, Minetta Aly, MD  hydrochlorothiazide  (HYDRODIURIL ) 12.5 MG tablet 2 tabs po qd 06/02/23   McGowen, Minetta Aly, MD  HYDROcodone -acetaminophen  (NORCO/VICODIN) 5-325 MG tablet 1-2 tab po bid as needed for pain 06/16/23   McGowen, Minetta Aly, MD  lactulose  (CHRONULAC ) 10 GM/15ML solution Take 30 mLs (20 g total) by mouth daily. 07/04/23   McGowen, Minetta Aly, MD  lamoTRIgine  (LAMICTAL ) 100 MG tablet TAKE 3 TABLETS (300 MG TOTAL) BY MOUTH AT BEDTIME 06/23/23   McGowen, Minetta Aly, MD  loratadine  (CLARITIN ) 10 MG tablet Take 10 mg by mouth daily.    [provider]  losartan  (COZAAR ) 50 MG tablet Take 1 tablet (50 mg total) by mouth  daily. 07/04/23   McGowen, Minetta Aly, MD  meloxicam  (MOBIC ) 15 MG tablet TAKE 1 TABLET BY MOUTH ONCE DAILY AS NEEDED FOR JOINT PAIN 08/03/23   McGowen, Minetta Aly, MD  metoprolol  succinate (TOPROL -XL) 100 MG 24 hr tablet 1 and 1/2 tabs p.o. daily.  Take with or immediately following a meal. 06/09/23   McGowen, Minetta Aly, MD  montelukast  (SINGULAIR ) 10 MG tablet TAKE 1 TABLET BY MOUTH EVERY DAY IN THE EVENING 05/15/23   McGowen, Minetta Aly, MD  naloxegol  oxalate (MOVANTIK ) 25 MG TABS tablet Take 1 tablet (25 mg total) by mouth daily. 07/04/23   McGowen, Minetta Aly, MD  nitroGLYCERIN  (NITROSTAT ) 0.4 MG SL tablet PLACE 1 TABLET (0.4 MG TOTAL) UNDER THE TONGUE EVERY 5 (FIVE) MINUTES X 3 DOSES AS NEEDED FOR CHEST PAIN. Patient not taking: Reported on 08/04/2023 07/03/23   McGowen, Philip H, MD  ondansetron  (ZOFRAN -ODT) 4 MG disintegrating tablet Take 1 tablet (4 mg total) by mouth every 8 (eight) hours as needed. 07/25/23   Mozell Arias, MD  pantoprazole  (PROTONIX ) 40 MG tablet TAKE 1 TABLET (40 MG TOTAL) BY MOUTH DAILY. KEEP F/U APPT FOR ANY FURTHER REFILLS 04/10/23   Pyrtle, Amber Bail, MD  potassium chloride  SA (KLOR-CON  M) 20 MEQ tablet TAKE 1 TABLET BY MOUTH EVERY DAY 08/03/23   McGowen, Minetta Aly, MD  Tiotropium Bromide -Olodaterol (STIOLTO RESPIMAT ) 2.5-2.5 MCG/ACT AERS Inhale 2 each into the lungs daily. 07/07/23   Antonio Baumgarten, NP   Physical Exam  Vital Signs:  Erin Lopez does not have vital signs available for review today. Given telephonic nature of communication, physical exam is limited. AAOx3. NAD. Normal affect.  Speech and respirations are unlabored. Accessory Clinical Findings  None Assessment & Plan    1.  Preoperative Cardiovascular Risk Assessment: Ms. Neves perioperative risk of a major cardiac event is 6.6% according to the Revised Cardiac Risk Index (RCRI).  Therefore, she is at high risk for perioperative complications.   Her functional capacity is good at 6.05 METs according to the [facility]  Activity Status Index (DASI). Recommendations: According to ACC/AHA guidelines, no  further cardiovascular testing needed.  The patient may proceed to surgery at acceptable risk.    The patient was advised that if she develops new symptoms prior to surgery to contact our office to arrange for a follow-up visit, and she verbalized understanding.  A copy of this note will be routed to requesting surgeon.  Time:   Today, I have spent 10 minutes with the patient with telehealth technology discussing medical history, symptoms, and management plan.     Emmalee Solivan D Annissa Andreoni, NP  08/04/2023, 5:01 PM

## 2023-08-04 NOTE — Telephone Encounter (Signed)
 Pt has been added on today for tele preop appt as she tells me that her surgery is planned for 08/08/23. I stated the surgeon told us  TBD. Med rec and consent are done.

## 2023-08-04 NOTE — Telephone Encounter (Signed)
 Home health referral completed, pt was notified.

## 2023-08-04 NOTE — Telephone Encounter (Signed)
 Pt has been added on today for tele preop appt as she tells me that her surgery is planned for 08/08/23. I stated the surgeon told us  TBD. Med rec and consent are done.    Patient Consent for Virtual Visit        Erin Lopez has provided verbal consent on 08/04/2023 for a virtual visit (video or telephone).   CONSENT FOR VIRTUAL VISIT FOR:  [name]  By participating in this virtual visit I agree to the following:  I hereby voluntarily request, consent and authorize [facility] and its employed or contracted physicians, physician assistants, nurse practitioners or other licensed health care professionals (the Practitioner), to provide me with telemedicine health care services (the "Services") as deemed necessary by the treating Practitioner. I acknowledge and consent to receive the Services by the Practitioner via telemedicine. I understand that the telemedicine visit will involve communicating with the Practitioner through live audiovisual communication technology and the disclosure of certain medical information by electronic transmission. I acknowledge that I have been given the opportunity to request an in-person assessment or other available alternative prior to the telemedicine visit and am voluntarily participating in the telemedicine visit.  I understand that I have the right to withhold or withdraw my consent to the use of telemedicine in the course of my care at any time, without affecting my right to future care or treatment, and that the Practitioner or I may terminate the telemedicine visit at any time. I understand that I have the right to inspect all information obtained and/or recorded in the course of the telemedicine visit and may receive copies of available information for a reasonable fee.  I understand that some of the potential risks of receiving the Services via telemedicine include:  Delay or interruption in medical evaluation due to technological  equipment failure or disruption; Information transmitted may not be sufficient (e.g. poor resolution of images) to allow for appropriate medical decision making by the Practitioner; and/or  In rare instances, security protocols could fail, causing a breach of personal health information.  Furthermore, I acknowledge that it is my responsibility to provide information about my medical history, conditions and care that is complete and accurate to the best of my ability. I acknowledge that Practitioner's advice, recommendations, and/or decision may be based on factors not within their control, such as incomplete or inaccurate data provided by me or distortions of diagnostic images or specimens that may result from electronic transmissions. I understand that the practice of medicine is not an exact science and that Practitioner makes no warranties or guarantees regarding treatment outcomes. I acknowledge that a copy of this consent can be made available to me via my patient portal [facility] MyChart), or I can request a printed copy by calling the office of [facility].    I understand that my insurance will be billed for this visit.   I have read or had this consent read to me. I understand the contents of this consent, which adequately explains the benefits and risks of the Services being provided via telemedicine.  I have been provided ample opportunity to ask questions regarding this consent and the Services and have had my questions answered to my satisfaction. I give my informed consent for the services to be provided through the use of telemedicine in my medical care

## 2023-08-05 MED ORDER — HYDROCODONE-ACETAMINOPHEN 5-325 MG PO TABS: ORAL_TABLET | ORAL | 0 refills | Status: DC

## 2023-08-07 NOTE — Telephone Encounter (Signed)
 Copy of this encounter and last ov note faxed to [facility]

## 2023-08-07 NOTE — Telephone Encounter (Signed)
 Fine to proceed with wrist surgery, she will be considered low risk from pulmonary standpoint

## 2023-08-08 DIAGNOSIS — G5601 Carpal tunnel syndrome, right upper limb: Secondary | ICD-10-CM | POA: Diagnosis not present

## 2023-08-08 DIAGNOSIS — G8918 Other acute postprocedural pain: Secondary | ICD-10-CM | POA: Diagnosis not present

## 2023-08-08 DIAGNOSIS — S52571A Other intraarticular fracture of lower end of right radius, initial encounter for closed fracture: Secondary | ICD-10-CM | POA: Diagnosis not present

## 2023-08-11 ENCOUNTER — Telehealth (INDEPENDENT_AMBULATORY_CARE_PROVIDER_SITE_OTHER): Admitting: Family Medicine

## 2023-08-11 ENCOUNTER — Encounter: Payer: Self-pay | Admitting: Family Medicine

## 2023-08-11 ENCOUNTER — Ambulatory Visit: Admitting: Family Medicine

## 2023-08-11 VITALS — BP 129/73

## 2023-08-11 DIAGNOSIS — I10 Essential (primary) hypertension: Secondary | ICD-10-CM | POA: Diagnosis not present

## 2023-08-11 DIAGNOSIS — M810 Age-related osteoporosis without current pathological fracture: Secondary | ICD-10-CM

## 2023-08-11 NOTE — Addendum Note (Signed)
 Addended by: Terris Fickle D on: 08/11/2023 01:41 PM   Modules accepted: Orders

## 2023-08-11 NOTE — Progress Notes (Signed)
 Virtual Visit via Video Note  I connected with Erin Lopez  on 08/11/23 at  1:00 PM EDT by a video enabled telemedicine application and verified that I am speaking with the correct person using two identifiers.  Location patient: [place] Location provider:work or home office Persons participating in the virtual visit: patient, provider  I discussed the limitations and requested verbal permission for telemedicine visit. The patient expressed understanding and agreed to proceed.  CC: 73 year old female being seen today for 10-day follow-up blood pressures. A/P as of last visit: "#1 fall, facial contusion and right radius fracture sustained. Facial pain improving gradually.   #2 hypertension, labile.  Lately it has been soft.  She is having some orthostatic dizziness. Instructions:   Stop amlodipine .  Okay to continue HCTZ 25 mg a day, Toprol -XL 150 mg a day, and losartan  50 mg a day. Monitor your blood pressure and heart rate at home and write the numbers down for review with me at your follow up visit. Electrolytes and creatinine were normal 1 week ago in the emergency department.   #3 insomnia.  I think she is intermittently having adverse effects from Ambien --> cognitive, mood, balance. Stop ambien . She is okay to take alprazolam  around bedtime to help her rest.   #4 intra-articular distal radius fracture (right)-->Has f/u with [facility] this afternoon. She will be getting surgery in the near future."  INTERIM HX: She got her wrist fracture repaired. Pain control is good on hydrocodone  about twice a day.  She takes a 500 mg dose of Tylenol  in between. Blood pressures have been normal. Sleep has been good.  She feels better off of the Ambien .  Mood and anxiety levels are pretty stable.  No dizziness or falls.  ROS: See pertinent positives and negatives per HPI.  Past Medical History:  Diagnosis Date   Acute encephalopathy 11/19/2017   Alcoholism in remission [facility])    states last  alcohol 11 yrs ago   Anemia    Anxiety    Bipolar 1 disorder (HCC)    Admission for mania x 2 (most recent 11/2017)   Cervical spondylosis without myelopathy 12/19/2013   MRI 02/2014: multilevel DDD/spondylosis, not much change compared to prior MRI.  Pt not in favor of invasive therapy for her neck as of 04/2014.   Chronic constipation    Chronic renal insufficiency, stage 3 (moderate) (HCC)    COPD (chronic obstructive pulmonary disease) (HCC)    Moderate: anoro started 04/2017 by pulm, pt symptomatically improved and PFTs stable at f/u 06/2017.   Coronary artery spasm (HCC) 07/11/2018   nitrates=HA. Amlodipine =intol swelling.  Changed to coreg  09/03/18 by cardiologist.   Depression    Endometritis 05/2017   possible; empiric tx with Flagyl  by GYN.   Femoral neck fracture (HCC)    right, 01/28/23-->ORIF   Fibromyalgia    GERD (gastroesophageal reflux disease)    Hepatic steatosis 03/2019   u/s abd   Herpes zoster 08/07/2017   L scapular region   Hiatal hernia    History of adenomatous polyp of colon 04/2008; 08/2014   No high grade dysplasia: recall 5 yrs (Dr. Honey Lusty, [facility])   History of basal cell carcinoma excision    NOSE   History of Helicobacter pylori infection 04/2008   +gastric biopsy (gastritis but no metaplasia, dysplasia, or malignancy identified)   History of kidney stones    History of TIA (transient ischemic attack)    secondary to HELLP syndrome 1994 post c/s--  residual memory loss  Hyperlipidemia    statin started after her NSTEMI: goal  LDL <70.   Hypertension    Ischemic cardiomyopathy 05/2018   Echo EF 45-50% in context of NSTEMI from CA spasm. 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 [name] 04/2017.   Memory loss     Abnl MRI brain and CT brain c/w chronic microvascular ischemia.  Repeat MRI brain 02/2014 stable (Dr. Levell Reach with no plan to f/u with neuro as of 04/2014)   NSTEMI (non-ST elevated myocardial infarction) (HCC) 05/2018   Echo EF 45-50% in context of NSTEMI from CA spasm.// Myoview  8/21: EF 66, no ischemia, low risk   Osteoarthritis of left wrist    STT and [facility]st [facility] jt   Osteoporosis 05/2010   2012 'penia; 06/2015 'porosis:  prolia .  DEXA 09/2017 T-score -2.2 femur neck. 11/2021 T score -2.2. Rpt 2 yrs   Pelvic floor dysfunction    [facility] (Dr. Dulcy Gibney)   Postmenopausal vaginal bleeding 05/2017   x 1 small episode; GYN attempted endo bx but unable to penetrate cervix due to severe cerv stenosis (due to postmenopausal state + hx of LEEP).  Endo u/s showed thin endo lining.  Per GYN---no evidence of endomet pathology on w/u---obs as of 07/2017.   Prediabetes 06/2016   Fasting gluc 105; HbA1c at that time was 6.0%.  A1c 6.2% 12/2017.  A1c 5.7% Feb 2023.   Recurrent kidney stones 08/2013   Left ureteral calculus: Perc nephr--cystoscopy w/ureteroscopy + laser for stone removal.  Residual asymptomatic left renal nephrolithiasis <66mm present post-procedure.  Right sided hydronephrosis-persistent (on u/s)--urol ordered CT to further eval 08/2016: 1.6 cm nonobstructing stone lower pole left kidney, no hydro, plan for PCN extraction ([facility])   Sepsis (HCC)    UTI   Suprapubic discomfort 06/05/2015   TMJ (temporomandibular joint disorder)    USES  MOUTH GUARD   Toe fracture, left 12/2017   2nd toe prox phalanx (immobilization, Dr. Seward Dao)   Vitamin D deficiency 05/2011    Past Surgical History:  Procedure Laterality Date   ABIs  11/29/2019   NORMAL   ANTERIOR CERVICAL DECOMP/DISCECTOMY FUSION  06/30/2009   C3 -- C5  AND EXPLORATION OF FUSION C5-7 W/  PLATE REMOVAL   ARTHRODESIS METATARSALPHALANGEAL JOINT (MTPJ) Left 07/08/2021   Procedure: Left hallux metatarsophalangeal joint arthrodesis;   Surgeon: Amada Backer, MD;  Location: [facility];  Service: Orthopedics;  Laterality: Left;   BACK SURGERY     BUNIONECTOMY Left    CARDIOVASCULAR STRESS TEST  12/03/2019   myoc perf im: NORMAL   CERVICAL FUSION  2002   anterior C5 -- C7   CERVICAL SPINE SURGERY  08/27/1999     C6 -- T1  LAMINECTOMY/  DISKECTOMY   CESAREAN SECTION  1994   CHOLECYSTECTOMY N/A 03/06/2020   Procedure: LAPAROSCOPIC CHOLECYSTECTOMY;  Surgeon: Junie Olds, MD;  Location: [facility] OR;  Service: General;  Laterality: N/A;   COLONOSCOPY W/ POLYPECTOMY  04/2008;08/2014   2016 tubular adenoma x 1; +diverticulosis and int/ext hemorrhoids.  06/2020 adenoma, recall 5 yrs.   CYSTOSCOPY WITH URETEROSCOPY AND STENT PLACEMENT Left 08/28/2013   Procedure: CYSTOSCOPY, RGP,  WITH URETEROSCOPY AND STENT REMOVAL;  Surgeon: Livingston Rigg, MD;  Location: [facility];  Service: Urology;  Laterality: Left;  DEXA  09/2017   T-score -2.2 femur neck: improved compared to 06/2015.  2021 DEXA T score -2.3.  11/2021 DEXA T score -2.2.   ESOPHAGOGASTRODUODENOSCOPY  2010; 08/2014   2016 +Candidal esophagitis; Mild chronic gastritis w/intestinal metaplasia--NEG H pylori, neg for eosinophilic esoph. 06/2020 esoph candidiasis (diflucan  x 20d), o/w normal.   HAMMER TOE SURGERY Left 07/08/2021   Procedure: Second metatarsal head excision; revision Second hammertoe correction; 2-5 percutaneus flexor tenotomies;  Surgeon: Amada Backer, MD;  Location: [facility];  Service: Orthopedics;  Laterality: Left;   HARDWARE REMOVAL Left 07/08/2021   Procedure: Removal of deep implants from hallux proximal phalanx and First and Second metatarsals;  Surgeon: Amada Backer, MD;  Location: [facility];  Service: Orthopedics;  Laterality: Left;   HIP PINNING,CANNULATED Right 01/28/2023   Procedure: PERCUTANEOUS FIXATION OF RIGHT FEMORAL NECK;  Surgeon: Janeth Medicus, MD;  Location: [facility] OR;  Service:  Orthopedics;  Laterality: Right;   HOLMIUM LASER APPLICATION Left 08/28/2013   Procedure: HOLMIUM LASER APPLICATION;  Surgeon: Livingston Rigg, MD;  [facility];  Service: Urology;  Laterality: Left;   LEFT HEART CATH AND CORONARY ANGIOGRAPHY N/A 05/15/2018   Evidence for spasm in distal LAD.  Mod RCA atherosclerosis, o/w no CAD.  Procedure: LEFT HEART CATH AND CORONARY ANGIOGRAPHY;  Surgeon: Arleen Lacer, MD;  Location: [facility] CV LAB;  Service: Cardiovascular;  Laterality: N/A;   NEGATIVE SLEEP STUDY  04/01/2007   NEPHROLITHOTOMY Left 08/05/2013   Procedure: NEPHROLITHOTOMY PERCUTANEOUS;  Surgeon: Livingston Rigg, MD;  Location: [facility];  Service: Urology;  Laterality: Left;   POLYSOMNOGRAM  01/2019   NORMAL   POLYSOMNOGRAPHY  10/25/2018   Dr. Ivana Maris due to pt inadequat sleep time (83 min).   TONSILLECTOMY AND ADENOIDECTOMY  1975   TOTAL KNEE ARTHROPLASTY Right 2003   TOTAL KNEE REVISION Right 01/19/2022   Procedure: Right knee polyethylene revision;  Surgeon: Liliane Rei, MD;  Location: [facility];  Service: Orthopedics;  Laterality: Right;   TRANSTHORACIC ECHOCARDIOGRAM  05/2018; 12/03/2019   (after MI secondary to arterial spasm): EF 45-50%; severe hypokinesis of apical anterolateral, inferolateral , anterior, and inferior LV myocardium. 11/2019 EF 55-60%, valves fine   ULTRASOUND EXAM,PELVIC COMPLETE ([facility] HX)  06/25/2015   NORMAL (done by GYN)     Current Outpatient Medications:    albuterol  (VENTOLIN  HFA) 108 (90 Base) MCG/ACT inhaler, TAKE 2 PUFFS BY MOUTH EVERY 6 HOURS AS NEEDED FOR WHEEZE OR SHORTNESS OF BREATH, Disp: 18 each, Rfl: 1   ALPRAZolam  (XANAX ) 0.5 MG tablet, TAKE 1 TABLET BY MOUTH THREE TIMES A DAY AS NEEDED FOR ANXIETY, Disp: 90 tablet, Rfl: 5   atorvastatin  (LIPITOR ) 80 MG tablet, TAKE 1 TABLET BY MOUTH EVERY DAY, Disp: 90 tablet, Rfl: 1   buPROPion  (WELLBUTRIN  XL) 300 MG 24 hr tablet, TAKE 1 TABLET BY MOUTH EVERY  DAY, Disp: 90 tablet, Rfl: 1   citalopram  (CELEXA ) 20 MG tablet, Take 1 tablet (20 mg total) by mouth in the morning and at bedtime., Disp: , Rfl:    citalopram  (CELEXA ) 40 MG tablet, TAKE 1 TABLET BY MOUTH EVERY DAY, Disp: 30 tablet, Rfl: 3   famotidine  (PEPCID ) 40 MG tablet, TAKE 1 TABLET BY MOUTH EVERY DAY, Disp: 90 tablet, Rfl: 1   fluticasone  (FLONASE ) 50 MCG/ACT nasal spray, SPRAY 2 SPRAYS INTO EACH NOSTRIL EVERY DAY, Disp: 48 mL, Rfl: 1   hydrochlorothiazide  (HYDRODIURIL ) 12.5 MG tablet, 2 tabs po qd, Disp: ,  Rfl:    HYDROcodone -acetaminophen  (NORCO/VICODIN) 5-325 MG tablet, 1-2 tab po bid as needed for pain, Disp: 90 tablet, Rfl: 0   lamoTRIgine  (LAMICTAL ) 100 MG tablet, TAKE 3 TABLETS (300 MG TOTAL) BY MOUTH AT BEDTIME, Disp: 270 tablet, Rfl: 2   loratadine  (CLARITIN ) 10 MG tablet, Take 10 mg by mouth daily., Disp: , Rfl:    losartan  (COZAAR ) 50 MG tablet, Take 1 tablet (50 mg total) by mouth daily., Disp: , Rfl:    meloxicam  (MOBIC ) 15 MG tablet, TAKE 1 TABLET BY MOUTH ONCE DAILY AS NEEDED FOR JOINT PAIN, Disp: 30 tablet, Rfl: 2   metoprolol  succinate (TOPROL -XL) 100 MG 24 hr tablet, 1 and 1/2 tabs p.o. daily.  Take with or immediately following a meal., Disp: , Rfl:    montelukast  (SINGULAIR ) 10 MG tablet, TAKE 1 TABLET BY MOUTH EVERY DAY IN THE EVENING, Disp: 90 tablet, Rfl: 1   ondansetron  (ZOFRAN -ODT) 4 MG disintegrating tablet, Take 1 tablet (4 mg total) by mouth every 8 (eight) hours as needed., Disp: 8 tablet, Rfl: 0   pantoprazole  (PROTONIX ) 40 MG tablet, TAKE 1 TABLET (40 MG TOTAL) BY MOUTH DAILY. KEEP F/U APPT FOR ANY FURTHER REFILLS, Disp: 90 tablet, Rfl: 1   potassium chloride  SA (KLOR-CON  M) 20 MEQ tablet, TAKE 1 TABLET BY MOUTH EVERY DAY, Disp: 90 tablet, Rfl: 1   Tiotropium Bromide -Olodaterol (STIOLTO RESPIMAT ) 2.5-2.5 MCG/ACT AERS, Inhale 2 each into the lungs daily., Disp: 4 g, Rfl: 5   lactulose  (CHRONULAC ) 10 GM/15ML solution, Take 30 mLs (20 g total) by mouth daily.  (Patient not taking: Reported on 08/11/2023), Disp: 236 mL, Rfl: 3   naloxegol  oxalate (MOVANTIK ) 25 MG TABS tablet, Take 1 tablet (25 mg total) by mouth daily. (Patient not taking: Reported on 08/11/2023), Disp: 30 tablet, Rfl: 5   nitroGLYCERIN  (NITROSTAT ) 0.4 MG SL tablet, PLACE 1 TABLET (0.4 MG TOTAL) UNDER THE TONGUE EVERY 5 (FIVE) MINUTES X 3 DOSES AS NEEDED FOR CHEST PAIN. (Patient not taking: Reported on 08/01/2023), Disp: 25 tablet, Rfl: 1  EXAM:  VITALS per patient if applicable:      08/11/2023    1:01 PM 08/11/2023   11:55 AM 08/01/2023    8:23 AM  Vitals with BMI  Height   5\' 8"   Weight   172 lbs 6 oz  BMI   26.22  Systolic 129 149 308  Diastolic 73 81 65  Pulse   67   GENERAL: alert, oriented, appears well and in no acute distress  HEENT: atraumatic, conjunttiva clear, no obvious abnormalities on inspection of external nose and ears  NECK: normal movements of the head and neck  LUNGS: on inspection no signs of respiratory distress, breathing rate appears normal, no obvious gross SOB, gasping or wheezing  CV: no obvious cyanosis  MS: moves all visible extremities without noticeable abnormality  PSYCH/NEURO: pleasant and cooperative, no obvious depression or anxiety, speech and thought processing grossly intact  LABS: none today    Chemistry      Component Value Date/Time   NA 135 07/25/2023 1016   NA 135 12/03/2019 1103   K 3.7 07/25/2023 1016   CL 100 07/25/2023 1016   CO2 26 07/25/2023 1016   BUN 18 07/25/2023 1016   BUN 8 12/03/2019 1103   CREATININE 0.91 07/25/2023 1016   CREATININE 1.02 (H) 06/09/2023 1159      Component Value Date/Time   CALCIUM  9.0 07/25/2023 1016   ALKPHOS 63 07/25/2023 1016   AST 13 (L)  07/25/2023 1016   ALT 19 07/25/2023 1016   BILITOT 0.9 07/25/2023 1016   BILITOT 0.7 09/25/2018 1007     ASSESSMENT AND PLAN:  Discussed the following assessment and plan:  #1 HTN, well controlled. Continue HCTZ 25 mg a day, Toprol -XL 150 mg a  day, and losartan  50 mg a day.   #2 chronic pain syndrome. Has superimposed acute right wrist pain, recent wrist fracture surgery. Continue Vicodin 5/325, 1-2 twice daily as needed. A new prescription was not needed today.  F/u: 2 wks  Signed:  Arletha Lady, MD           08/11/2023

## 2023-08-12 DIAGNOSIS — Z8673 Personal history of transient ischemic attack (TIA), and cerebral infarction without residual deficits: Secondary | ICD-10-CM | POA: Diagnosis not present

## 2023-08-12 DIAGNOSIS — G894 Chronic pain syndrome: Secondary | ICD-10-CM | POA: Diagnosis not present

## 2023-08-12 DIAGNOSIS — F319 Bipolar disorder, unspecified: Secondary | ICD-10-CM | POA: Diagnosis not present

## 2023-08-12 DIAGNOSIS — J449 Chronic obstructive pulmonary disease, unspecified: Secondary | ICD-10-CM | POA: Diagnosis not present

## 2023-08-12 DIAGNOSIS — I129 Hypertensive chronic kidney disease with stage 1 through stage 4 chronic kidney disease, or unspecified chronic kidney disease: Secondary | ICD-10-CM | POA: Diagnosis not present

## 2023-08-12 DIAGNOSIS — Z9181 History of falling: Secondary | ICD-10-CM | POA: Diagnosis not present

## 2023-08-12 DIAGNOSIS — Z85828 Personal history of other malignant neoplasm of skin: Secondary | ICD-10-CM | POA: Diagnosis not present

## 2023-08-12 DIAGNOSIS — M80031D Age-related osteoporosis with current pathological fracture, right forearm, subsequent encounter for fracture with routine healing: Secondary | ICD-10-CM | POA: Diagnosis not present

## 2023-08-12 DIAGNOSIS — I25111 Atherosclerotic heart disease of native coronary artery with angina pectoris with documented spasm: Secondary | ICD-10-CM | POA: Diagnosis not present

## 2023-08-12 DIAGNOSIS — K760 Fatty (change of) liver, not elsewhere classified: Secondary | ICD-10-CM | POA: Diagnosis not present

## 2023-08-12 DIAGNOSIS — N183 Chronic kidney disease, stage 3 unspecified: Secondary | ICD-10-CM | POA: Diagnosis not present

## 2023-08-12 DIAGNOSIS — K5909 Other constipation: Secondary | ICD-10-CM | POA: Diagnosis not present

## 2023-08-12 DIAGNOSIS — M80051D Age-related osteoporosis with current pathological fracture, right femur, subsequent encounter for fracture with routine healing: Secondary | ICD-10-CM | POA: Diagnosis not present

## 2023-08-12 DIAGNOSIS — M7542 Impingement syndrome of left shoulder: Secondary | ICD-10-CM | POA: Diagnosis not present

## 2023-08-12 DIAGNOSIS — F419 Anxiety disorder, unspecified: Secondary | ICD-10-CM | POA: Diagnosis not present

## 2023-08-12 DIAGNOSIS — I255 Ischemic cardiomyopathy: Secondary | ICD-10-CM | POA: Diagnosis not present

## 2023-08-12 DIAGNOSIS — D631 Anemia in chronic kidney disease: Secondary | ICD-10-CM | POA: Diagnosis not present

## 2023-08-12 DIAGNOSIS — G9340 Encephalopathy, unspecified: Secondary | ICD-10-CM | POA: Diagnosis not present

## 2023-08-14 ENCOUNTER — Other Ambulatory Visit: Payer: Self-pay | Admitting: Internal Medicine

## 2023-08-14 ENCOUNTER — Other Ambulatory Visit: Payer: Self-pay | Admitting: Family Medicine

## 2023-08-14 ENCOUNTER — Telehealth: Payer: Self-pay

## 2023-08-14 MED ORDER — LOSARTAN POTASSIUM 50 MG PO TABS: 50.0000 mg | ORAL_TABLET | Freq: Every day | ORAL | 0 refills | Status: DC

## 2023-08-14 NOTE — Telephone Encounter (Signed)
 Communication  Caller/Agency: [facility]    Callback Number: [phone] (secure line)    Service Requested: Physical Therapy    Frequency: 1 week 1, 2 week 3, 1 week 3    Any new concerns about the patient? No   Okay for orders?

## 2023-08-14 NOTE — Telephone Encounter (Signed)
 Copied from CRM [id number]. Topic: Clinical - Medication Refill >> Aug 14, 2023  2:54 PM Clyde Darling P wrote: Medication: losartan  (COZAAR ) 50 MG tablet  Has the patient contacted their pharmacy? Yes - stated the prescription inactive (Agent: If no, request that the patient contact the pharmacy for the refill. If patient does not wish to contact the pharmacy document the reason why and proceed with request.) (Agent: If yes, when and what did the pharmacy advise?)  This is the patient's preferred pharmacy:  [id number], [place] - [address] [place] [address] Phone: [phone] Fax: [phone]  Is this the correct pharmacy for this prescription? Yes If no, delete pharmacy and type the correct one.   Has the prescription been filled recently? No  Is the patient out of the medication? Yes- pt is needing medication   Has the patient been seen for an appointment in the last year OR does the patient have an upcoming appointment? Yes  Can we respond through MyChart? Yes  Agent: Please be advised that Rx refills may take up to 3 business days. We ask that you follow-up with your pharmacy.

## 2023-08-14 NOTE — Telephone Encounter (Signed)
 The original prescription was discontinued on 08/01/2023 by McGowen, Philip H, MD. Renewing this prescription may not be appropriate.

## 2023-08-14 NOTE — Telephone Encounter (Signed)
 Last Fill: Unk  Last OV: 08/11/23 Next OV: 08/25/23  Routing to provider for review/authorization.

## 2023-08-14 NOTE — Telephone Encounter (Signed)
Yes, okay.

## 2023-08-14 NOTE — Telephone Encounter (Signed)
 Approved VO given to Monique

## 2023-08-15 ENCOUNTER — Telehealth: Payer: Self-pay | Admitting: Family Medicine

## 2023-08-15 NOTE — Telephone Encounter (Unsigned)
 Copied from CRM [id number]. Topic: Clinical - Prescription Issue >> Aug 15, 2023 11:28 AM Danae Duncans wrote: Reason for CRM: Pt was checking status of losartan  50 mg , Advised it shows approved- [facility] [address]-  Pt would like a phone call once rx has been sent [phone]

## 2023-08-16 DIAGNOSIS — M80051D Age-related osteoporosis with current pathological fracture, right femur, subsequent encounter for fracture with routine healing: Secondary | ICD-10-CM | POA: Diagnosis not present

## 2023-08-16 DIAGNOSIS — K760 Fatty (change of) liver, not elsewhere classified: Secondary | ICD-10-CM | POA: Diagnosis not present

## 2023-08-16 DIAGNOSIS — G894 Chronic pain syndrome: Secondary | ICD-10-CM | POA: Diagnosis not present

## 2023-08-16 DIAGNOSIS — K5909 Other constipation: Secondary | ICD-10-CM | POA: Diagnosis not present

## 2023-08-16 DIAGNOSIS — I129 Hypertensive chronic kidney disease with stage 1 through stage 4 chronic kidney disease, or unspecified chronic kidney disease: Secondary | ICD-10-CM | POA: Diagnosis not present

## 2023-08-16 DIAGNOSIS — Z9181 History of falling: Secondary | ICD-10-CM | POA: Diagnosis not present

## 2023-08-16 DIAGNOSIS — N183 Chronic kidney disease, stage 3 unspecified: Secondary | ICD-10-CM | POA: Diagnosis not present

## 2023-08-16 DIAGNOSIS — F319 Bipolar disorder, unspecified: Secondary | ICD-10-CM | POA: Diagnosis not present

## 2023-08-16 DIAGNOSIS — G9340 Encephalopathy, unspecified: Secondary | ICD-10-CM | POA: Diagnosis not present

## 2023-08-16 DIAGNOSIS — I255 Ischemic cardiomyopathy: Secondary | ICD-10-CM | POA: Diagnosis not present

## 2023-08-16 DIAGNOSIS — J449 Chronic obstructive pulmonary disease, unspecified: Secondary | ICD-10-CM | POA: Diagnosis not present

## 2023-08-16 DIAGNOSIS — I25111 Atherosclerotic heart disease of native coronary artery with angina pectoris with documented spasm: Secondary | ICD-10-CM | POA: Diagnosis not present

## 2023-08-16 DIAGNOSIS — Z85828 Personal history of other malignant neoplasm of skin: Secondary | ICD-10-CM | POA: Diagnosis not present

## 2023-08-16 DIAGNOSIS — F419 Anxiety disorder, unspecified: Secondary | ICD-10-CM | POA: Diagnosis not present

## 2023-08-16 DIAGNOSIS — M7542 Impingement syndrome of left shoulder: Secondary | ICD-10-CM | POA: Diagnosis not present

## 2023-08-16 DIAGNOSIS — Z8673 Personal history of transient ischemic attack (TIA), and cerebral infarction without residual deficits: Secondary | ICD-10-CM | POA: Diagnosis not present

## 2023-08-16 DIAGNOSIS — M80031D Age-related osteoporosis with current pathological fracture, right forearm, subsequent encounter for fracture with routine healing: Secondary | ICD-10-CM | POA: Diagnosis not present

## 2023-08-16 DIAGNOSIS — D631 Anemia in chronic kidney disease: Secondary | ICD-10-CM | POA: Diagnosis not present

## 2023-08-16 MED ORDER — LOSARTAN POTASSIUM 25 MG PO TABS: ORAL_TABLET | ORAL | 3 refills | Status: DC

## 2023-08-16 NOTE — Telephone Encounter (Addendum)
 Reason for CRM: patient would like a call back regarding her medication                Called and spoke with pt. Pt stated her insurance will not allow her pharmacy to fill the current script for Losartan  50 MG due to it being written. Pt is requesting change to "25MG  twice daily" so it will be covered/approved by her insurance. Pt has one pill left for tomorrow. Please advise.

## 2023-08-16 NOTE — Addendum Note (Signed)
 Addended by: Shelvia Dick on: 08/16/2023 01:02 PM   Modules accepted: Orders

## 2023-08-16 NOTE — Telephone Encounter (Signed)
 OK, rx for losartan  25mg , 1 bid--->sent today

## 2023-08-16 NOTE — Telephone Encounter (Signed)
 Communication  Reason for CRM: patient would like a call back regarding her medication   LVM to discuss.

## 2023-08-16 NOTE — Telephone Encounter (Signed)
 Pt made aware

## 2023-08-17 DIAGNOSIS — Z8673 Personal history of transient ischemic attack (TIA), and cerebral infarction without residual deficits: Secondary | ICD-10-CM | POA: Diagnosis not present

## 2023-08-17 DIAGNOSIS — Z9181 History of falling: Secondary | ICD-10-CM | POA: Diagnosis not present

## 2023-08-17 DIAGNOSIS — Z85828 Personal history of other malignant neoplasm of skin: Secondary | ICD-10-CM | POA: Diagnosis not present

## 2023-08-17 DIAGNOSIS — D631 Anemia in chronic kidney disease: Secondary | ICD-10-CM | POA: Diagnosis not present

## 2023-08-17 DIAGNOSIS — J449 Chronic obstructive pulmonary disease, unspecified: Secondary | ICD-10-CM | POA: Diagnosis not present

## 2023-08-17 DIAGNOSIS — I129 Hypertensive chronic kidney disease with stage 1 through stage 4 chronic kidney disease, or unspecified chronic kidney disease: Secondary | ICD-10-CM | POA: Diagnosis not present

## 2023-08-17 DIAGNOSIS — M80051D Age-related osteoporosis with current pathological fracture, right femur, subsequent encounter for fracture with routine healing: Secondary | ICD-10-CM | POA: Diagnosis not present

## 2023-08-17 DIAGNOSIS — K5909 Other constipation: Secondary | ICD-10-CM | POA: Diagnosis not present

## 2023-08-17 DIAGNOSIS — M80031D Age-related osteoporosis with current pathological fracture, right forearm, subsequent encounter for fracture with routine healing: Secondary | ICD-10-CM | POA: Diagnosis not present

## 2023-08-17 DIAGNOSIS — G894 Chronic pain syndrome: Secondary | ICD-10-CM | POA: Diagnosis not present

## 2023-08-17 DIAGNOSIS — G9340 Encephalopathy, unspecified: Secondary | ICD-10-CM | POA: Diagnosis not present

## 2023-08-17 DIAGNOSIS — F419 Anxiety disorder, unspecified: Secondary | ICD-10-CM | POA: Diagnosis not present

## 2023-08-17 DIAGNOSIS — K760 Fatty (change of) liver, not elsewhere classified: Secondary | ICD-10-CM | POA: Diagnosis not present

## 2023-08-17 DIAGNOSIS — I25111 Atherosclerotic heart disease of native coronary artery with angina pectoris with documented spasm: Secondary | ICD-10-CM | POA: Diagnosis not present

## 2023-08-17 DIAGNOSIS — F319 Bipolar disorder, unspecified: Secondary | ICD-10-CM | POA: Diagnosis not present

## 2023-08-17 DIAGNOSIS — N183 Chronic kidney disease, stage 3 unspecified: Secondary | ICD-10-CM | POA: Diagnosis not present

## 2023-08-17 DIAGNOSIS — I255 Ischemic cardiomyopathy: Secondary | ICD-10-CM | POA: Diagnosis not present

## 2023-08-17 DIAGNOSIS — M7542 Impingement syndrome of left shoulder: Secondary | ICD-10-CM | POA: Diagnosis not present

## 2023-08-18 DIAGNOSIS — Z9181 History of falling: Secondary | ICD-10-CM | POA: Diagnosis not present

## 2023-08-18 DIAGNOSIS — I255 Ischemic cardiomyopathy: Secondary | ICD-10-CM | POA: Diagnosis not present

## 2023-08-18 DIAGNOSIS — M7542 Impingement syndrome of left shoulder: Secondary | ICD-10-CM | POA: Diagnosis not present

## 2023-08-18 DIAGNOSIS — G894 Chronic pain syndrome: Secondary | ICD-10-CM | POA: Diagnosis not present

## 2023-08-18 DIAGNOSIS — J449 Chronic obstructive pulmonary disease, unspecified: Secondary | ICD-10-CM | POA: Diagnosis not present

## 2023-08-18 DIAGNOSIS — K5909 Other constipation: Secondary | ICD-10-CM | POA: Diagnosis not present

## 2023-08-18 DIAGNOSIS — K760 Fatty (change of) liver, not elsewhere classified: Secondary | ICD-10-CM | POA: Diagnosis not present

## 2023-08-18 DIAGNOSIS — F419 Anxiety disorder, unspecified: Secondary | ICD-10-CM | POA: Diagnosis not present

## 2023-08-18 DIAGNOSIS — M80031D Age-related osteoporosis with current pathological fracture, right forearm, subsequent encounter for fracture with routine healing: Secondary | ICD-10-CM | POA: Diagnosis not present

## 2023-08-18 DIAGNOSIS — Z8673 Personal history of transient ischemic attack (TIA), and cerebral infarction without residual deficits: Secondary | ICD-10-CM | POA: Diagnosis not present

## 2023-08-18 DIAGNOSIS — Z85828 Personal history of other malignant neoplasm of skin: Secondary | ICD-10-CM | POA: Diagnosis not present

## 2023-08-18 DIAGNOSIS — I25111 Atherosclerotic heart disease of native coronary artery with angina pectoris with documented spasm: Secondary | ICD-10-CM | POA: Diagnosis not present

## 2023-08-18 DIAGNOSIS — G9340 Encephalopathy, unspecified: Secondary | ICD-10-CM | POA: Diagnosis not present

## 2023-08-18 DIAGNOSIS — N183 Chronic kidney disease, stage 3 unspecified: Secondary | ICD-10-CM | POA: Diagnosis not present

## 2023-08-18 DIAGNOSIS — I129 Hypertensive chronic kidney disease with stage 1 through stage 4 chronic kidney disease, or unspecified chronic kidney disease: Secondary | ICD-10-CM | POA: Diagnosis not present

## 2023-08-18 DIAGNOSIS — D631 Anemia in chronic kidney disease: Secondary | ICD-10-CM | POA: Diagnosis not present

## 2023-08-18 DIAGNOSIS — F319 Bipolar disorder, unspecified: Secondary | ICD-10-CM | POA: Diagnosis not present

## 2023-08-18 DIAGNOSIS — M80051D Age-related osteoporosis with current pathological fracture, right femur, subsequent encounter for fracture with routine healing: Secondary | ICD-10-CM | POA: Diagnosis not present

## 2023-08-21 DIAGNOSIS — M80051D Age-related osteoporosis with current pathological fracture, right femur, subsequent encounter for fracture with routine healing: Secondary | ICD-10-CM | POA: Diagnosis not present

## 2023-08-21 DIAGNOSIS — F319 Bipolar disorder, unspecified: Secondary | ICD-10-CM | POA: Diagnosis not present

## 2023-08-21 DIAGNOSIS — D631 Anemia in chronic kidney disease: Secondary | ICD-10-CM | POA: Diagnosis not present

## 2023-08-21 DIAGNOSIS — S52501A Unspecified fracture of the lower end of right radius, initial encounter for closed fracture: Secondary | ICD-10-CM | POA: Diagnosis not present

## 2023-08-21 DIAGNOSIS — J449 Chronic obstructive pulmonary disease, unspecified: Secondary | ICD-10-CM | POA: Diagnosis not present

## 2023-08-21 DIAGNOSIS — I25111 Atherosclerotic heart disease of native coronary artery with angina pectoris with documented spasm: Secondary | ICD-10-CM | POA: Diagnosis not present

## 2023-08-21 DIAGNOSIS — M80031D Age-related osteoporosis with current pathological fracture, right forearm, subsequent encounter for fracture with routine healing: Secondary | ICD-10-CM | POA: Diagnosis not present

## 2023-08-21 DIAGNOSIS — N183 Chronic kidney disease, stage 3 unspecified: Secondary | ICD-10-CM | POA: Diagnosis not present

## 2023-08-21 DIAGNOSIS — I255 Ischemic cardiomyopathy: Secondary | ICD-10-CM | POA: Diagnosis not present

## 2023-08-21 DIAGNOSIS — G894 Chronic pain syndrome: Secondary | ICD-10-CM | POA: Diagnosis not present

## 2023-08-21 DIAGNOSIS — K760 Fatty (change of) liver, not elsewhere classified: Secondary | ICD-10-CM | POA: Diagnosis not present

## 2023-08-21 DIAGNOSIS — M7542 Impingement syndrome of left shoulder: Secondary | ICD-10-CM | POA: Diagnosis not present

## 2023-08-21 DIAGNOSIS — Z85828 Personal history of other malignant neoplasm of skin: Secondary | ICD-10-CM | POA: Diagnosis not present

## 2023-08-21 DIAGNOSIS — G5601 Carpal tunnel syndrome, right upper limb: Secondary | ICD-10-CM | POA: Diagnosis not present

## 2023-08-21 DIAGNOSIS — Z9181 History of falling: Secondary | ICD-10-CM | POA: Diagnosis not present

## 2023-08-21 DIAGNOSIS — F419 Anxiety disorder, unspecified: Secondary | ICD-10-CM | POA: Diagnosis not present

## 2023-08-21 DIAGNOSIS — G9340 Encephalopathy, unspecified: Secondary | ICD-10-CM | POA: Diagnosis not present

## 2023-08-21 DIAGNOSIS — Z8673 Personal history of transient ischemic attack (TIA), and cerebral infarction without residual deficits: Secondary | ICD-10-CM | POA: Diagnosis not present

## 2023-08-21 DIAGNOSIS — K5909 Other constipation: Secondary | ICD-10-CM | POA: Diagnosis not present

## 2023-08-21 DIAGNOSIS — I129 Hypertensive chronic kidney disease with stage 1 through stage 4 chronic kidney disease, or unspecified chronic kidney disease: Secondary | ICD-10-CM | POA: Diagnosis not present

## 2023-08-22 ENCOUNTER — Telehealth: Payer: Self-pay

## 2023-08-22 DIAGNOSIS — G9340 Encephalopathy, unspecified: Secondary | ICD-10-CM | POA: Diagnosis not present

## 2023-08-22 DIAGNOSIS — G894 Chronic pain syndrome: Secondary | ICD-10-CM | POA: Diagnosis not present

## 2023-08-22 DIAGNOSIS — K5909 Other constipation: Secondary | ICD-10-CM | POA: Diagnosis not present

## 2023-08-22 DIAGNOSIS — Z8673 Personal history of transient ischemic attack (TIA), and cerebral infarction without residual deficits: Secondary | ICD-10-CM | POA: Diagnosis not present

## 2023-08-22 DIAGNOSIS — M7542 Impingement syndrome of left shoulder: Secondary | ICD-10-CM | POA: Diagnosis not present

## 2023-08-22 DIAGNOSIS — Z85828 Personal history of other malignant neoplasm of skin: Secondary | ICD-10-CM | POA: Diagnosis not present

## 2023-08-22 DIAGNOSIS — F319 Bipolar disorder, unspecified: Secondary | ICD-10-CM | POA: Diagnosis not present

## 2023-08-22 DIAGNOSIS — Z9181 History of falling: Secondary | ICD-10-CM | POA: Diagnosis not present

## 2023-08-22 DIAGNOSIS — I25111 Atherosclerotic heart disease of native coronary artery with angina pectoris with documented spasm: Secondary | ICD-10-CM | POA: Diagnosis not present

## 2023-08-22 DIAGNOSIS — K760 Fatty (change of) liver, not elsewhere classified: Secondary | ICD-10-CM | POA: Diagnosis not present

## 2023-08-22 DIAGNOSIS — F419 Anxiety disorder, unspecified: Secondary | ICD-10-CM | POA: Diagnosis not present

## 2023-08-22 DIAGNOSIS — D631 Anemia in chronic kidney disease: Secondary | ICD-10-CM | POA: Diagnosis not present

## 2023-08-22 DIAGNOSIS — N183 Chronic kidney disease, stage 3 unspecified: Secondary | ICD-10-CM | POA: Diagnosis not present

## 2023-08-22 DIAGNOSIS — I129 Hypertensive chronic kidney disease with stage 1 through stage 4 chronic kidney disease, or unspecified chronic kidney disease: Secondary | ICD-10-CM | POA: Diagnosis not present

## 2023-08-22 DIAGNOSIS — M80051D Age-related osteoporosis with current pathological fracture, right femur, subsequent encounter for fracture with routine healing: Secondary | ICD-10-CM | POA: Diagnosis not present

## 2023-08-22 DIAGNOSIS — I255 Ischemic cardiomyopathy: Secondary | ICD-10-CM | POA: Diagnosis not present

## 2023-08-22 DIAGNOSIS — J449 Chronic obstructive pulmonary disease, unspecified: Secondary | ICD-10-CM | POA: Diagnosis not present

## 2023-08-22 DIAGNOSIS — M80031D Age-related osteoporosis with current pathological fracture, right forearm, subsequent encounter for fracture with routine healing: Secondary | ICD-10-CM | POA: Diagnosis not present

## 2023-08-22 NOTE — Telephone Encounter (Signed)
 Home health orders received 08/22/23 for Erin Lopez Home health initiation orders: Yes.  Home health re-certification orders: No. Patient last seen by ordering physician for this condition: 08/11/23. Must be less than 90 days for re-certification and less than 30 days prior for initiation. Visit must have been for the condition the orders are being placed.  Patient meets criteria for Physician to sign orders: Yes.        Current med list has been attached: Yes        Orders placed on physicians desk for signature: 08/22/23 (date) If patient does not meet criteria for orders to be signed: pt was called to schedule appt. Appt is scheduled for n/a.   Erin Lopez

## 2023-08-23 DIAGNOSIS — I25111 Atherosclerotic heart disease of native coronary artery with angina pectoris with documented spasm: Secondary | ICD-10-CM

## 2023-08-23 DIAGNOSIS — M80051D Age-related osteoporosis with current pathological fracture, right femur, subsequent encounter for fracture with routine healing: Secondary | ICD-10-CM

## 2023-08-23 DIAGNOSIS — J449 Chronic obstructive pulmonary disease, unspecified: Secondary | ICD-10-CM

## 2023-08-23 DIAGNOSIS — M80031D Age-related osteoporosis with current pathological fracture, right forearm, subsequent encounter for fracture with routine healing: Secondary | ICD-10-CM

## 2023-08-23 DIAGNOSIS — I129 Hypertensive chronic kidney disease with stage 1 through stage 4 chronic kidney disease, or unspecified chronic kidney disease: Secondary | ICD-10-CM

## 2023-08-23 DIAGNOSIS — G894 Chronic pain syndrome: Secondary | ICD-10-CM

## 2023-08-23 DIAGNOSIS — M7542 Impingement syndrome of left shoulder: Secondary | ICD-10-CM

## 2023-08-23 DIAGNOSIS — F419 Anxiety disorder, unspecified: Secondary | ICD-10-CM

## 2023-08-23 DIAGNOSIS — I255 Ischemic cardiomyopathy: Secondary | ICD-10-CM

## 2023-08-23 DIAGNOSIS — D631 Anemia in chronic kidney disease: Secondary | ICD-10-CM

## 2023-08-23 DIAGNOSIS — N183 Chronic kidney disease, stage 3 unspecified: Secondary | ICD-10-CM

## 2023-08-23 DIAGNOSIS — F319 Bipolar disorder, unspecified: Secondary | ICD-10-CM

## 2023-08-24 DIAGNOSIS — M80031D Age-related osteoporosis with current pathological fracture, right forearm, subsequent encounter for fracture with routine healing: Secondary | ICD-10-CM | POA: Diagnosis not present

## 2023-08-24 DIAGNOSIS — Z85828 Personal history of other malignant neoplasm of skin: Secondary | ICD-10-CM | POA: Diagnosis not present

## 2023-08-24 DIAGNOSIS — K5909 Other constipation: Secondary | ICD-10-CM | POA: Diagnosis not present

## 2023-08-24 DIAGNOSIS — Z8673 Personal history of transient ischemic attack (TIA), and cerebral infarction without residual deficits: Secondary | ICD-10-CM | POA: Diagnosis not present

## 2023-08-24 DIAGNOSIS — F319 Bipolar disorder, unspecified: Secondary | ICD-10-CM | POA: Diagnosis not present

## 2023-08-24 DIAGNOSIS — I129 Hypertensive chronic kidney disease with stage 1 through stage 4 chronic kidney disease, or unspecified chronic kidney disease: Secondary | ICD-10-CM | POA: Diagnosis not present

## 2023-08-24 DIAGNOSIS — I25111 Atherosclerotic heart disease of native coronary artery with angina pectoris with documented spasm: Secondary | ICD-10-CM | POA: Diagnosis not present

## 2023-08-24 DIAGNOSIS — M7542 Impingement syndrome of left shoulder: Secondary | ICD-10-CM | POA: Diagnosis not present

## 2023-08-24 DIAGNOSIS — N183 Chronic kidney disease, stage 3 unspecified: Secondary | ICD-10-CM | POA: Diagnosis not present

## 2023-08-24 DIAGNOSIS — K760 Fatty (change of) liver, not elsewhere classified: Secondary | ICD-10-CM | POA: Diagnosis not present

## 2023-08-24 DIAGNOSIS — G9340 Encephalopathy, unspecified: Secondary | ICD-10-CM | POA: Diagnosis not present

## 2023-08-24 DIAGNOSIS — Z9181 History of falling: Secondary | ICD-10-CM | POA: Diagnosis not present

## 2023-08-24 DIAGNOSIS — M80051D Age-related osteoporosis with current pathological fracture, right femur, subsequent encounter for fracture with routine healing: Secondary | ICD-10-CM | POA: Diagnosis not present

## 2023-08-24 DIAGNOSIS — G894 Chronic pain syndrome: Secondary | ICD-10-CM | POA: Diagnosis not present

## 2023-08-24 DIAGNOSIS — J449 Chronic obstructive pulmonary disease, unspecified: Secondary | ICD-10-CM | POA: Diagnosis not present

## 2023-08-24 DIAGNOSIS — I255 Ischemic cardiomyopathy: Secondary | ICD-10-CM | POA: Diagnosis not present

## 2023-08-24 DIAGNOSIS — F419 Anxiety disorder, unspecified: Secondary | ICD-10-CM | POA: Diagnosis not present

## 2023-08-24 DIAGNOSIS — D631 Anemia in chronic kidney disease: Secondary | ICD-10-CM | POA: Diagnosis not present

## 2023-08-24 NOTE — Telephone Encounter (Signed)
 Signed and put in box to go up front. Signed:  Arletha Lady, MD           08/24/2023

## 2023-08-25 ENCOUNTER — Encounter: Payer: Self-pay | Admitting: Family Medicine

## 2023-08-25 ENCOUNTER — Telehealth (INDEPENDENT_AMBULATORY_CARE_PROVIDER_SITE_OTHER): Admitting: Family Medicine

## 2023-08-25 VITALS — BP 145/87

## 2023-08-25 DIAGNOSIS — I10 Essential (primary) hypertension: Secondary | ICD-10-CM

## 2023-08-25 NOTE — Progress Notes (Signed)
 Virtual Visit via Video Note  I connected with Erin Lopez  on 08/25/23 at  1:00 PM EDT by a video enabled telemedicine application and verified that I am speaking with the correct person using two identifiers.  Location patient: [place] Location provider:work or home office Persons participating in the virtual visit: patient, provider  I discussed the limitations and requested verbal permission for telemedicine visit. The patient expressed understanding and agreed to proceed.  CC: 73 y/o female being seen today for 2-week follow-up HTN. A/P as of last visit: "#1 HTN, well controlled. Continue HCTZ 25 mg a day, Toprol -XL 150 mg a day, and losartan  50 mg a day.   #2 chronic pain syndrome. Has superimposed acute right wrist pain, recent wrist fracture surgery. Continue Vicodin 5/325, 1-2 twice daily as needed. A new prescription was not needed today."  INTERIM HX: Having nonspecific malaise lately.  More depressed and anxious. Describes fatigue, intermitten random and brief periods of shortness of breath.  No chest pain, fever, cough, or nausea vomiting.  Yesterday she had few loose stools.  Prior to that bowel movements have been normal. Blood pressure has consistently been in the 140s to 160s over 80s to 90s. In general, she feels like the way she feels lately is due to her high blood pressure.   ROS: See pertinent positives and negatives per HPI.  Past Medical History:  Diagnosis Date   Acute encephalopathy 11/19/2017   Alcoholism in remission [facility])    states last alcohol 11 yrs ago   Anemia    Anxiety    Bipolar 1 disorder (HCC)    Admission for mania x 2 (most recent 11/2017)   Cervical spondylosis without myelopathy 12/19/2013   MRI 02/2014: multilevel DDD/spondylosis, not much change compared to prior MRI.  Pt not in favor of invasive therapy for her neck as of 04/2014.   Chronic constipation    Chronic renal insufficiency, stage 3 (moderate) (HCC)    COPD (chronic obstructive  pulmonary disease) (HCC)    Moderate: anoro started 04/2017 by pulm, pt symptomatically improved and PFTs stable at f/u 06/2017.   Coronary artery spasm (HCC) 07/11/2018   nitrates=HA. Amlodipine =intol swelling.  Changed to coreg  09/03/18 by cardiologist.   Depression    Endometritis 05/2017   possible; empiric tx with Flagyl  by GYN.   Femoral neck fracture (HCC)    right, 01/28/23-->ORIF   Fibromyalgia    GERD (gastroesophageal reflux disease)    Hepatic steatosis 03/2019   u/s abd   Herpes zoster 08/07/2017   L scapular region   Hiatal hernia    History of adenomatous polyp of colon 04/2008; 08/2014   No high grade dysplasia: recall 5 yrs (Dr. Honey Lusty, [facility])   History of basal cell carcinoma excision    NOSE   History of Helicobacter pylori infection 04/2008   +gastric biopsy (gastritis but no metaplasia, dysplasia, or malignancy identified)   History of kidney stones    History of TIA (transient ischemic attack)    secondary to HELLP syndrome 1994 post c/s--  residual memory loss   Hyperlipidemia    statin started after her NSTEMI: goal  LDL <70.   Hypertension    Ischemic cardiomyopathy 05/2018   Echo EF 45-50% in context of NSTEMI from CA spasm. // Echo 8/21: 55-60, mild LVH, normal RVSF, RVSP 40, trivial MR   Left foot pain    Hallux deformity+adhesive capsulitis+ hammertoe:  Severe structural bunion deformity with hallux interphalangeus and severe arthritis of the second  MPJ left foot--surgical repair/osteotomies by Dr. [name] 04/2017.   Memory loss    Abnl MRI brain and CT brain c/w chronic microvascular ischemia.  Repeat MRI brain 02/2014 stable (Dr. Levell Reach with no plan to f/u with neuro as of 04/2014)   NSTEMI (non-ST elevated myocardial infarction) (HCC) 05/2018   Echo EF 45-50% in context of NSTEMI from CA spasm.// Myoview  8/21: EF 66, no ischemia, low risk   Osteoarthritis of left wrist    STT and 1st [facility] jt   Osteoporosis 05/2010   2012 'penia; 06/2015  'porosis:  prolia .  DEXA 09/2017 T-score -2.2 femur neck. 11/2021 T score -2.2. Rpt 2 yrs   Pelvic floor dysfunction    [facility] (Dr. Dulcy Gibney)   Postmenopausal vaginal bleeding 05/2017   x 1 small episode; GYN attempted endo bx but unable to penetrate cervix due to severe cerv stenosis (due to postmenopausal state + hx of LEEP).  Endo u/s showed thin endo lining.  Per GYN---no evidence of endomet pathology on w/u---obs as of 07/2017.   Prediabetes 06/2016   Fasting gluc 105; HbA1c at that time was 6.0%.  A1c 6.2% 12/2017.  A1c 5.7% Feb 2023.   Recurrent kidney stones 08/2013   Left ureteral calculus: Perc nephr--cystoscopy w/ureteroscopy + laser for stone removal.  Residual asymptomatic left renal nephrolithiasis <64mm present post-procedure.  Right sided hydronephrosis-persistent (on u/s)--urol ordered CT to further eval 08/2016: 1.6 cm nonobstructing stone lower pole left kidney, no hydro, plan for PCN extraction ([facility])   Sepsis (HCC)    UTI   Suprapubic discomfort 06/05/2015   TMJ (temporomandibular joint disorder)    USES  MOUTH GUARD   Toe fracture, left 12/2017   2nd toe prox phalanx (immobilization, Dr. Seward Dao)   Vitamin D deficiency 05/2011    Past Surgical History:  Procedure Laterality Date   ABIs  11/29/2019   NORMAL   ANTERIOR CERVICAL DECOMP/DISCECTOMY FUSION  06/30/2009   C3 -- C5  AND EXPLORATION OF FUSION C5-7 W/  PLATE REMOVAL   ARTHRODESIS METATARSALPHALANGEAL JOINT (MTPJ) Left 07/08/2021   Procedure: Left hallux metatarsophalangeal joint arthrodesis;  Surgeon: Amada Backer, MD;  Location:  SURGERY CENTER;  Service: Orthopedics;  Laterality: Left;   BACK SURGERY     BUNIONECTOMY Left    CARDIOVASCULAR STRESS TEST  12/03/2019   myoc perf im: NORMAL   CERVICAL FUSION  2002   anterior C5 -- C7   CERVICAL SPINE SURGERY  08/27/1999     C6 -- T1  LAMINECTOMY/  DISKECTOMY   CESAREAN SECTION  1994   CHOLECYSTECTOMY N/A 03/06/2020   Procedure: LAPAROSCOPIC  CHOLECYSTECTOMY;  Surgeon: Junie Olds, MD;  Location: [facility];  Service: General;  Laterality: N/A;   COLONOSCOPY W/ POLYPECTOMY  04/2008;08/2014   2016 tubular adenoma x 1; +diverticulosis and int/ext hemorrhoids.  06/2020 adenoma, recall 5 yrs.   CYSTOSCOPY WITH URETEROSCOPY AND STENT PLACEMENT Left 08/28/2013   Procedure: CYSTOSCOPY, RGP,  WITH URETEROSCOPY AND STENT REMOVAL;  Surgeon: Livingston Rigg, MD;  Location: [facility];  Service: Urology;  Laterality: Left;   DEXA  09/2017   T-score -2.2 femur neck: improved compared to 06/2015.  2021 DEXA T score -2.3.  11/2021 DEXA T score -2.2.   ESOPHAGOGASTRODUODENOSCOPY  2010; 08/2014   2016 +Candidal esophagitis; Mild chronic gastritis w/intestinal metaplasia--NEG H pylori, neg for eosinophilic esoph. 06/2020 esoph candidiasis (diflucan  x 20d), o/w normal.   HAMMER TOE SURGERY Left 07/08/2021   Procedure: Second metatarsal head excision; revision  Second hammertoe correction; 2-5 percutaneus flexor tenotomies;  Surgeon: Amada Backer, MD;  Location: [facility];  Service: Orthopedics;  Laterality: Left;   HARDWARE REMOVAL Left 07/08/2021   Procedure: Removal of deep implants from hallux proximal phalanx and First and Second metatarsals;  Surgeon: Amada Backer, MD;  Location: [facility];  Service: Orthopedics;  Laterality: Left;   HIP PINNING,CANNULATED Right 01/28/2023   Procedure: PERCUTANEOUS FIXATION OF RIGHT FEMORAL NECK;  Surgeon: Janeth Medicus, MD;  Location: [facility] OR;  Service: Orthopedics;  Laterality: Right;   HOLMIUM LASER APPLICATION Left 08/28/2013   Procedure: HOLMIUM LASER APPLICATION;  Surgeon: Livingston Rigg, MD;  Location: [facility];  Service: Urology;  Laterality: Left;   LEFT HEART CATH AND CORONARY ANGIOGRAPHY N/A 05/15/2018   Evidence for spasm in distal LAD.  Mod RCA atherosclerosis, o/w no CAD.  Procedure: LEFT HEART CATH AND CORONARY ANGIOGRAPHY;  Surgeon:  Arleen Lacer, MD;  Location: [facility] IN[facility]AS[facility];  Service: Cardiovascular;  Laterality: N/A;   NEGATIVE SLEEP STUDY  04/01/2007   NEPHROLITHOTOMY Left 08/05/2013   Procedure: NEPHROLITHOTOMY PERCUTANEOUS;  Surgeon: Livingston Rigg, MD;  Location: [facility];  Service: Urology;  Laterality: Left;   POLYSOMNOGRAM  01/2019   NORMAL   POLYSOMNOGRAPHY  10/25/2018   Dr. Ivana Maris due to pt inadequat sleep time (83 min).   TONSILLECTOMY AND ADENOIDECTOMY  1975   TOTAL KNEE ARTHROPLASTY Right 2003   TOTAL KNEE REVISION Right 01/19/2022   Procedure: Right knee polyethylene revision;  Surgeon: Liliane Rei, MD;  Location: [facility];  Service: Orthopedics;  Laterality: Right;   TRANSTHORACIC ECHOCARDIOGRAM  05/2018; 12/03/2019   (after MI secondary to arterial spasm): EF 45-50%; severe hypokinesis of apical anterolateral, inferolateral , anterior, and inferior LV myocardium. 11/2019 EF 55-60%, valves fine   ULTRASOUND EXAM,PELVIC COMPLETE ([facility] HX)  06/25/2015   NORMAL (done by GYN)     Current Outpatient Medications:    albuterol  (VENTOLIN  HFA) 108 (90 Base) MCG/ACT inhaler, TAKE 2 PUFFS BY MOUTH EVERY 6 HOURS AS NEEDED FOR WHEEZE OR SHORTNESS OF BREATH, Disp: 18 each, Rfl: 1   ALPRAZolam  (XANAX ) 0.5 MG tablet, TAKE 1 TABLET BY MOUTH THREE TIMES A DAY AS NEEDED FOR ANXIETY, Disp: 90 tablet, Rfl: 5   atorvastatin  (LIPITOR ) 80 MG tablet, TAKE 1 TABLET BY MOUTH EVERY DAY, Disp: 90 tablet, Rfl: 1   buPROPion  (WELLBUTRIN  XL) 300 MG 24 hr tablet, TAKE 1 TABLET BY MOUTH EVERY DAY, Disp: 90 tablet, Rfl: 1   citalopram  (CELEXA ) 20 MG tablet, Take 1 tablet (20 mg total) by mouth in the morning and at bedtime., Disp: , Rfl:    citalopram  (CELEXA ) 40 MG tablet, TAKE 1 TABLET BY MOUTH EVERY DAY, Disp: 30 tablet, Rfl: 3   famotidine  (PEPCID ) 40 MG tablet, TAKE 1 TABLET BY MOUTH EVERY DAY, Disp: 90 tablet, Rfl: 1   fluticasone  (FLONASE ) 50 MCG/ACT nasal spray, SPRAY 2 SPRAYS INTO EACH NOSTRIL EVERY  DAY, Disp: 48 mL, Rfl: 1   hydrochlorothiazide  (HYDRODIURIL ) 12.5 MG tablet, 2 tabs po qd, Disp: , Rfl:    HYDROcodone -acetaminophen  (NORCO/VICODIN) 5-325 MG tablet, 1-2 tab po bid as needed for pain, Disp: 90 tablet, Rfl: 0   lamoTRIgine  (LAMICTAL ) 100 MG tablet, TAKE 3 TABLETS (300 MG TOTAL) BY MOUTH AT BEDTIME, Disp: 270 tablet, Rfl: 2   loratadine  (CLARITIN ) 10 MG tablet, Take 10 mg by mouth daily., Disp: , Rfl:    losartan  (COZAAR ) 25 MG tablet, 1  tab po bid, Disp: 180 tablet, Rfl: 3   meloxicam  (MOBIC ) 15 MG tablet, TAKE 1 TABLET BY MOUTH ONCE DAILY AS NEEDED FOR JOINT PAIN, Disp: 30 tablet, Rfl: 2   metoprolol  succinate (TOPROL -XL) 100 MG 24 hr tablet, 1 and 1/2 tabs p.o. daily.  Take with or immediately following a meal., Disp: , Rfl:    montelukast  (SINGULAIR ) 10 MG tablet, TAKE 1 TABLET BY MOUTH EVERY DAY IN THE EVENING, Disp: 90 tablet, Rfl: 1   ondansetron  (ZOFRAN -ODT) 4 MG disintegrating tablet, Take 1 tablet (4 mg total) by mouth every 8 (eight) hours as needed., Disp: 8 tablet, Rfl: 0   pantoprazole  (PROTONIX ) 40 MG tablet, TAKE 1 TABLET (40 MG TOTAL) BY MOUTH DAILY. KEEP F/U APPT FOR ANY FURTHER REFILLS, Disp: 90 tablet, Rfl: 0   potassium chloride  SA (KLOR-CON  M) 20 MEQ tablet, TAKE 1 TABLET BY MOUTH EVERY DAY, Disp: 90 tablet, Rfl: 1   Tiotropium Bromide -Olodaterol (STIOLTO RESPIMAT ) 2.5-2.5 MCG/ACT AERS, Inhale 2 each into the lungs daily., Disp: 4 g, Rfl: 5   lactulose  (CHRONULAC ) 10 GM/15ML solution, Take 30 mLs (20 g total) by mouth daily. (Patient not taking: Reported on 08/25/2023), Disp: 236 mL, Rfl: 3   nitroGLYCERIN  (NITROSTAT ) 0.4 MG SL tablet, PLACE 1 TABLET (0.4 MG TOTAL) UNDER THE TONGUE EVERY 5 (FIVE) MINUTES X 3 DOSES AS NEEDED FOR CHEST PAIN. (Patient not taking: Reported on 08/25/2023), Disp: 25 tablet, Rfl: 1  EXAM:  VITALS per patient if applicable:     08/25/2023    1:02 PM 08/11/2023    1:01 PM 08/11/2023   11:55 AM  Vitals with BMI  Systolic 145 129 962   Diastolic 87 73 81     GENERAL: alert, oriented, appears well and in no acute distress  HEENT: atraumatic, conjunttiva clear, no obvious abnormalities on inspection of external nose and ears  NECK: normal movements of the head and neck  LUNGS: on inspection no signs of respiratory distress, breathing rate appears normal, no obvious gross SOB, gasping or wheezing  CV: no obvious cyanosis  MS: moves all visible extremities without noticeable abnormality  PSYCH/NEURO: pleasant and cooperative, no obvious depression or anxiety, speech and thought processing grossly intact  LABS: none today    Chemistry      Component Value Date/Time   NA 135 07/25/2023 1016   NA 135 12/03/2019 1103   K 3.7 07/25/2023 1016   CL 100 07/25/2023 1016   CO2 26 07/25/2023 1016   BUN 18 07/25/2023 1016   BUN 8 12/03/2019 1103   CREATININE 0.91 07/25/2023 1016   CREATININE 1.02 (H) 06/09/2023 1159      Component Value Date/Time   CALCIUM  9.0 07/25/2023 1016   ALKPHOS 63 07/25/2023 1016   AST 13 (L) 07/25/2023 1016   ALT 19 07/25/2023 1016   BILITOT 0.9 07/25/2023 1016   BILITOT 0.7 09/25/2018 1007      ASSESSMENT AND PLAN:  Discussed the following assessment and plan:  Uncontrolled hypertension. Increase losartan  to 50 mg twice a day and increase Toprol  to 200 mg a day. Continue HCTZ 25 mg a day.   I discussed the assessment and treatment plan with the patient. The patient was provided an opportunity to ask questions and all were answered. The patient agreed with the plan and demonstrated an understanding of the instructions.   F/u: 1 week  Signed:  Arletha Lady, MD           08/25/2023

## 2023-08-26 ENCOUNTER — Other Ambulatory Visit: Payer: Self-pay | Admitting: Family Medicine

## 2023-08-29 ENCOUNTER — Telehealth: Payer: Self-pay | Admitting: Family Medicine

## 2023-08-29 DIAGNOSIS — K760 Fatty (change of) liver, not elsewhere classified: Secondary | ICD-10-CM | POA: Diagnosis not present

## 2023-08-29 DIAGNOSIS — Z9181 History of falling: Secondary | ICD-10-CM | POA: Diagnosis not present

## 2023-08-29 DIAGNOSIS — I25111 Atherosclerotic heart disease of native coronary artery with angina pectoris with documented spasm: Secondary | ICD-10-CM | POA: Diagnosis not present

## 2023-08-29 DIAGNOSIS — K5909 Other constipation: Secondary | ICD-10-CM | POA: Diagnosis not present

## 2023-08-29 DIAGNOSIS — F319 Bipolar disorder, unspecified: Secondary | ICD-10-CM | POA: Diagnosis not present

## 2023-08-29 DIAGNOSIS — M80051D Age-related osteoporosis with current pathological fracture, right femur, subsequent encounter for fracture with routine healing: Secondary | ICD-10-CM | POA: Diagnosis not present

## 2023-08-29 DIAGNOSIS — Z85828 Personal history of other malignant neoplasm of skin: Secondary | ICD-10-CM | POA: Diagnosis not present

## 2023-08-29 DIAGNOSIS — M7542 Impingement syndrome of left shoulder: Secondary | ICD-10-CM | POA: Diagnosis not present

## 2023-08-29 DIAGNOSIS — G894 Chronic pain syndrome: Secondary | ICD-10-CM | POA: Diagnosis not present

## 2023-08-29 DIAGNOSIS — Z8673 Personal history of transient ischemic attack (TIA), and cerebral infarction without residual deficits: Secondary | ICD-10-CM | POA: Diagnosis not present

## 2023-08-29 DIAGNOSIS — I129 Hypertensive chronic kidney disease with stage 1 through stage 4 chronic kidney disease, or unspecified chronic kidney disease: Secondary | ICD-10-CM | POA: Diagnosis not present

## 2023-08-29 DIAGNOSIS — M80031D Age-related osteoporosis with current pathological fracture, right forearm, subsequent encounter for fracture with routine healing: Secondary | ICD-10-CM | POA: Diagnosis not present

## 2023-08-29 DIAGNOSIS — F419 Anxiety disorder, unspecified: Secondary | ICD-10-CM | POA: Diagnosis not present

## 2023-08-29 DIAGNOSIS — N183 Chronic kidney disease, stage 3 unspecified: Secondary | ICD-10-CM | POA: Diagnosis not present

## 2023-08-29 DIAGNOSIS — I255 Ischemic cardiomyopathy: Secondary | ICD-10-CM | POA: Diagnosis not present

## 2023-08-29 DIAGNOSIS — D631 Anemia in chronic kidney disease: Secondary | ICD-10-CM | POA: Diagnosis not present

## 2023-08-29 DIAGNOSIS — J449 Chronic obstructive pulmonary disease, unspecified: Secondary | ICD-10-CM | POA: Diagnosis not present

## 2023-08-29 DIAGNOSIS — G9340 Encephalopathy, unspecified: Secondary | ICD-10-CM | POA: Diagnosis not present

## 2023-08-29 NOTE — Telephone Encounter (Unsigned)
 Copied from CRM [id number]. Topic: Clinical - Medication Refill >> Aug 29, 2023 12:05 PM Trula Gable C wrote: Medication: metoprolol  succinate (TOPROL -XL) 100 MG 24 hr tablet   Has the patient contacted their pharmacy? Yes (Agent: If no, request that the patient contact the pharmacy for the refill. If patient does not wish to contact the pharmacy document the reason why and proceed with request.) (Agent: If yes, when and what did the pharmacy advise?)  This is the patient's preferred pharmacy:  [id number] [place], [place] - [address] [place] [address] Phone: [phone] Fax: [phone]  Is this the correct pharmacy for this prescription? Yes If no, delete pharmacy and type the correct one.   Has the prescription been filled recently? No  Is the patient out of the medication? Yes  Has the patient been seen for an appointment in the last year OR does the patient have an upcoming appointment? Yes  Can we respond through MyChart? Yes  Agent: Please be advised that Rx refills may take up to 3 business days. We ask that you follow-up with your pharmacy.

## 2023-08-30 ENCOUNTER — Other Ambulatory Visit: Payer: Self-pay | Admitting: Family Medicine

## 2023-08-30 MED ORDER — METOPROLOL SUCCINATE ER 100 MG PO TB24: ORAL_TABLET | ORAL | 1 refills | Status: DC

## 2023-08-30 NOTE — Telephone Encounter (Signed)
 Duplicate request from 5/27, see other encounter

## 2023-08-30 NOTE — Telephone Encounter (Signed)
 Rx done as no print on 06/09/23

## 2023-08-30 NOTE — Telephone Encounter (Signed)
 Last Fill: Unknown  Last OV: 08/25/23 Next OV: 09/01/23  Routing to provider for review/authorization.

## 2023-08-31 ENCOUNTER — Encounter (HOSPITAL_BASED_OUTPATIENT_CLINIC_OR_DEPARTMENT_OTHER)

## 2023-08-31 ENCOUNTER — Ambulatory Visit: Admitting: Primary Care

## 2023-08-31 DIAGNOSIS — M80051D Age-related osteoporosis with current pathological fracture, right femur, subsequent encounter for fracture with routine healing: Secondary | ICD-10-CM | POA: Diagnosis not present

## 2023-08-31 DIAGNOSIS — F319 Bipolar disorder, unspecified: Secondary | ICD-10-CM | POA: Diagnosis not present

## 2023-08-31 DIAGNOSIS — I129 Hypertensive chronic kidney disease with stage 1 through stage 4 chronic kidney disease, or unspecified chronic kidney disease: Secondary | ICD-10-CM | POA: Diagnosis not present

## 2023-08-31 DIAGNOSIS — M7542 Impingement syndrome of left shoulder: Secondary | ICD-10-CM | POA: Diagnosis not present

## 2023-08-31 DIAGNOSIS — G9340 Encephalopathy, unspecified: Secondary | ICD-10-CM | POA: Diagnosis not present

## 2023-08-31 DIAGNOSIS — N183 Chronic kidney disease, stage 3 unspecified: Secondary | ICD-10-CM | POA: Diagnosis not present

## 2023-08-31 DIAGNOSIS — K5909 Other constipation: Secondary | ICD-10-CM | POA: Diagnosis not present

## 2023-08-31 DIAGNOSIS — D631 Anemia in chronic kidney disease: Secondary | ICD-10-CM | POA: Diagnosis not present

## 2023-08-31 DIAGNOSIS — Z9181 History of falling: Secondary | ICD-10-CM | POA: Diagnosis not present

## 2023-08-31 DIAGNOSIS — G894 Chronic pain syndrome: Secondary | ICD-10-CM | POA: Diagnosis not present

## 2023-08-31 DIAGNOSIS — F419 Anxiety disorder, unspecified: Secondary | ICD-10-CM | POA: Diagnosis not present

## 2023-08-31 DIAGNOSIS — I255 Ischemic cardiomyopathy: Secondary | ICD-10-CM | POA: Diagnosis not present

## 2023-08-31 DIAGNOSIS — M80031D Age-related osteoporosis with current pathological fracture, right forearm, subsequent encounter for fracture with routine healing: Secondary | ICD-10-CM | POA: Diagnosis not present

## 2023-08-31 DIAGNOSIS — I25111 Atherosclerotic heart disease of native coronary artery with angina pectoris with documented spasm: Secondary | ICD-10-CM | POA: Diagnosis not present

## 2023-08-31 DIAGNOSIS — K760 Fatty (change of) liver, not elsewhere classified: Secondary | ICD-10-CM | POA: Diagnosis not present

## 2023-08-31 DIAGNOSIS — Z85828 Personal history of other malignant neoplasm of skin: Secondary | ICD-10-CM | POA: Diagnosis not present

## 2023-08-31 DIAGNOSIS — J449 Chronic obstructive pulmonary disease, unspecified: Secondary | ICD-10-CM | POA: Diagnosis not present

## 2023-08-31 DIAGNOSIS — Z8673 Personal history of transient ischemic attack (TIA), and cerebral infarction without residual deficits: Secondary | ICD-10-CM | POA: Diagnosis not present

## 2023-09-01 ENCOUNTER — Telehealth (INDEPENDENT_AMBULATORY_CARE_PROVIDER_SITE_OTHER): Admitting: Family Medicine

## 2023-09-01 ENCOUNTER — Encounter: Payer: Self-pay | Admitting: Family Medicine

## 2023-09-01 VITALS — BP 138/80 | HR 77

## 2023-09-01 DIAGNOSIS — K760 Fatty (change of) liver, not elsewhere classified: Secondary | ICD-10-CM | POA: Diagnosis not present

## 2023-09-01 DIAGNOSIS — F411 Generalized anxiety disorder: Secondary | ICD-10-CM

## 2023-09-01 DIAGNOSIS — I129 Hypertensive chronic kidney disease with stage 1 through stage 4 chronic kidney disease, or unspecified chronic kidney disease: Secondary | ICD-10-CM | POA: Diagnosis not present

## 2023-09-01 DIAGNOSIS — G894 Chronic pain syndrome: Secondary | ICD-10-CM | POA: Diagnosis not present

## 2023-09-01 DIAGNOSIS — J449 Chronic obstructive pulmonary disease, unspecified: Secondary | ICD-10-CM | POA: Diagnosis not present

## 2023-09-01 DIAGNOSIS — N183 Chronic kidney disease, stage 3 unspecified: Secondary | ICD-10-CM | POA: Diagnosis not present

## 2023-09-01 DIAGNOSIS — I10 Essential (primary) hypertension: Secondary | ICD-10-CM | POA: Diagnosis not present

## 2023-09-01 DIAGNOSIS — M80051D Age-related osteoporosis with current pathological fracture, right femur, subsequent encounter for fracture with routine healing: Secondary | ICD-10-CM | POA: Diagnosis not present

## 2023-09-01 DIAGNOSIS — F419 Anxiety disorder, unspecified: Secondary | ICD-10-CM | POA: Diagnosis not present

## 2023-09-01 DIAGNOSIS — M80031D Age-related osteoporosis with current pathological fracture, right forearm, subsequent encounter for fracture with routine healing: Secondary | ICD-10-CM | POA: Diagnosis not present

## 2023-09-01 DIAGNOSIS — I255 Ischemic cardiomyopathy: Secondary | ICD-10-CM | POA: Diagnosis not present

## 2023-09-01 DIAGNOSIS — Z79899 Other long term (current) drug therapy: Secondary | ICD-10-CM

## 2023-09-01 DIAGNOSIS — D631 Anemia in chronic kidney disease: Secondary | ICD-10-CM | POA: Diagnosis not present

## 2023-09-01 DIAGNOSIS — K5909 Other constipation: Secondary | ICD-10-CM | POA: Diagnosis not present

## 2023-09-01 DIAGNOSIS — Z8673 Personal history of transient ischemic attack (TIA), and cerebral infarction without residual deficits: Secondary | ICD-10-CM | POA: Diagnosis not present

## 2023-09-01 DIAGNOSIS — I25111 Atherosclerotic heart disease of native coronary artery with angina pectoris with documented spasm: Secondary | ICD-10-CM | POA: Diagnosis not present

## 2023-09-01 DIAGNOSIS — Z9181 History of falling: Secondary | ICD-10-CM | POA: Diagnosis not present

## 2023-09-01 DIAGNOSIS — Z85828 Personal history of other malignant neoplasm of skin: Secondary | ICD-10-CM | POA: Diagnosis not present

## 2023-09-01 DIAGNOSIS — G9340 Encephalopathy, unspecified: Secondary | ICD-10-CM | POA: Diagnosis not present

## 2023-09-01 DIAGNOSIS — F319 Bipolar disorder, unspecified: Secondary | ICD-10-CM | POA: Diagnosis not present

## 2023-09-01 DIAGNOSIS — M7542 Impingement syndrome of left shoulder: Secondary | ICD-10-CM | POA: Diagnosis not present

## 2023-09-01 MED ORDER — ALPRAZOLAM 0.5 MG PO TABS: ORAL_TABLET | ORAL | 5 refills | Status: DC

## 2023-09-01 MED ORDER — AMLODIPINE BESYLATE 5 MG PO TABS: 5.0000 mg | ORAL_TABLET | Freq: Every day | ORAL | 0 refills | Status: DC

## 2023-09-01 NOTE — Progress Notes (Signed)
 Virtual Visit via Video Note  I connected with Erin Lopez  on 09/01/23 at  3:00 PM EDT by a video enabled telemedicine application and verified that I am speaking with the correct person using two identifiers.  Location patient: [place] Location provider:work or home office Persons participating in the virtual visit: patient, provider  I discussed the limitations and requested verbal permission for telemedicine visit. The patient expressed understanding and agreed to proceed.  CC:  73 year old female being seen today for 1 week follow-up hypertension. At last visit we increased her losartan  to 50 mg twice a day and increased Toprol  to 200 mg a day.  INTERIM HX: Feeling better from a mental/emotional standpoint. She just got PT today for her arm and feels pretty good. Anxiety has been higher lately because has been cooped up in her house.  She has taken approximately 4 alprazolam  per day rather than her usual 3/day. Blood pressures remain moderately elevated, 150-170 over 80s to 90s. No episodes of low blood pressure or dizziness.   PMP AWARE reviewed today: most recent rx for alprazolam  0.5 mg was filled 08/02/23, # 90, rx by me. No red flags.  Review of systems: As per HPI, also no headaches, visual abnormalities, chest pain, shortness of breath, or lower extremity swelling.  Past Medical History:  Diagnosis Date   Acute encephalopathy 11/19/2017   Alcoholism in remission [facility])    states last alcohol 11 yrs ago   Anemia    Anxiety    Bipolar 1 disorder (HCC)    Admission for mania x 2 (most recent 11/2017)   Cervical spondylosis without myelopathy 12/19/2013   MRI 02/2014: multilevel DDD/spondylosis, not much change compared to prior MRI.  Pt not in favor of invasive therapy for her neck as of 04/2014.   Chronic constipation    Chronic renal insufficiency, stage 3 (moderate) (HCC)    COPD (chronic obstructive pulmonary disease) (HCC)    Moderate: anoro started 04/2017 by pulm, pt  symptomatically improved and PFTs stable at f/u 06/2017.   Coronary artery spasm (HCC) 07/11/2018   nitrates=HA. Amlodipine =intol swelling.  Changed to coreg  09/03/18 by cardiologist.   Depression    Endometritis 05/2017   possible; empiric tx with Flagyl  by GYN.   Femoral neck fracture (HCC)    right, 01/28/23-->ORIF   Fibromyalgia    GERD (gastroesophageal reflux disease)    Hepatic steatosis 03/2019   u/s abd   Herpes zoster 08/07/2017   L scapular region   Hiatal hernia    History of adenomatous polyp of colon 04/2008; 08/2014   No high grade dysplasia: recall 5 yrs (Dr. Honey Lusty, [facility])   History of basal cell carcinoma excision    NOSE   History of Helicobacter pylori infection 04/2008   +gastric biopsy (gastritis but no metaplasia, dysplasia, or malignancy identified)   History of kidney stones    History of TIA (transient ischemic attack)    secondary to HELLP syndrome 1994 post c/s--  residual memory loss   Hyperlipidemia    statin started after her NSTEMI: goal  LDL <70.   Hypertension    Ischemic cardiomyopathy 05/2018   Echo EF 45-50% in context of NSTEMI from CA spasm. 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 [name] 04/2017.   Memory loss  Abnl MRI brain and CT brain c/w chronic microvascular ischemia.  Repeat MRI brain 02/2014 stable (Dr. Levell Reach with no plan to f/u with neuro as of 04/2014)   NSTEMI (non-ST elevated myocardial infarction) (HCC) 05/2018   Echo EF 45-50% in context of NSTEMI from CA spasm.// Myoview  8/21: EF 66, no ischemia, low risk   Osteoarthritis of left wrist    STT and 1st [facility] jt   Osteoporosis 05/2010   2012 'penia; 06/2015 'porosis:  prolia .  DEXA 09/2017 T-score -2.2 femur neck. 11/2021 T score -2.2.  Rpt 2 yrs   Pelvic floor dysfunction    [facility] (Dr. Dulcy Gibney)   Postmenopausal vaginal bleeding 05/2017   x 1 small episode; GYN attempted endo bx but unable to penetrate cervix due to severe cerv stenosis (due to postmenopausal state + hx of LEEP).  Endo u/s showed thin endo lining.  Per GYN---no evidence of endomet pathology on w/u---obs as of 07/2017.   Prediabetes 06/2016   Fasting gluc 105; HbA1c at that time was 6.0%.  A1c 6.2% 12/2017.  A1c 5.7% Feb 2023.   Recurrent kidney stones 08/2013   Left ureteral calculus: Perc nephr--cystoscopy w/ureteroscopy + laser for stone removal.  Residual asymptomatic left renal nephrolithiasis <55mm present post-procedure.  Right sided hydronephrosis-persistent (on u/s)--urol ordered CT to further eval 08/2016: 1.6 cm nonobstructing stone lower pole left kidney, no hydro, plan for PCN extraction ([facility])   Sepsis (HCC)    UTI   Suprapubic discomfort 06/05/2015   TMJ (temporomandibular joint disorder)    USES  MOUTH GUARD   Toe fracture, left 12/2017   2nd toe prox phalanx (immobilization, Dr. Seward Dao)   Vitamin D deficiency 05/2011    Past Surgical History:  Procedure Laterality Date   ABIs  11/29/2019   NORMAL   ANTERIOR CERVICAL DECOMP/DISCECTOMY FUSION  06/30/2009   C3 -- C5  AND EXPLORATION OF FUSION C5-7 W/  PLATE REMOVAL   ARTHRODESIS METATARSALPHALANGEAL JOINT (MTPJ) Left 07/08/2021   Procedure: Left hallux metatarsophalangeal joint arthrodesis;  Surgeon: Amada Backer, MD;  Location: [facility];  Service: Orthopedics;  Laterality: Left;   BACK SURGERY     BUNIONECTOMY Left    CARDIOVASCULAR STRESS TEST  12/03/2019   myoc perf im: NORMAL   CERVICAL FUSION  2002   anterior C5 -- C7   CERVICAL SPINE SURGERY  08/27/1999     C6 -- T1  LAMINECTOMY/  DISKECTOMY   CESAREAN SECTION  1994   CHOLECYSTECTOMY N/A 03/06/2020   Procedure: LAPAROSCOPIC CHOLECYSTECTOMY;  Surgeon: Junie Olds, MD;  Location: [facility];  Service:  General;  Laterality: N/A;   COLONOSCOPY W/ POLYPECTOMY  04/2008;08/2014   2016 tubular adenoma x 1; +diverticulosis and int/ext hemorrhoids.  06/2020 adenoma, recall 5 yrs.   CYSTOSCOPY WITH URETEROSCOPY AND STENT PLACEMENT Left 08/28/2013   Procedure: CYSTOSCOPY, RGP,  WITH URETEROSCOPY AND STENT REMOVAL;  Surgeon: Livingston Rigg, MD;  Location: [facility];  Service: Urology;  Laterality: Left;   DEXA  09/2017   T-score -2.2 femur neck: improved compared to 06/2015.  2021 DEXA T score -2.3.  11/2021 DEXA T score -2.2.   ESOPHAGOGASTRODUODENOSCOPY  2010; 08/2014   2016 +Candidal esophagitis; Mild chronic gastritis w/intestinal metaplasia--NEG H pylori, neg for eosinophilic esoph. 06/2020 esoph candidiasis (diflucan  x 20d), o/w normal.   HAMMER TOE SURGERY Left 07/08/2021   Procedure: Second metatarsal head excision; revision Second hammertoe correction; 2-5 percutaneus flexor tenotomies;  Surgeon: Amada Backer, MD;  Location: [facility];  Service: Orthopedics;  Laterality: Left;   HARDWARE REMOVAL Left 07/08/2021   Procedure: Removal of deep implants from hallux proximal phalanx and First and Second metatarsals;  Surgeon: Amada Backer, MD;  Location: [facility];  Service: Orthopedics;  Laterality: Left;   HIP PINNING,CANNULATED Right 01/28/2023   Procedure: PERCUTANEOUS FIXATION OF RIGHT FEMORAL NECK;  Surgeon: Janeth Medicus, MD;  Location: [facility];  Service: Orthopedics;  Laterality: Right;   HOLMIUM LASER APPLICATION Left 08/28/2013   Procedure: HOLMIUM LASER APPLICATION;  Surgeon: Livingston Rigg, MD;  Location: [facility];  Service: Urology;  Laterality: Left;   LEFT HEART CATH AND CORONARY ANGIOGRAPHY N/A 05/15/2018   Evidence for spasm in distal LAD.  Mod RCA atherosclerosis, o/w no CAD.  Procedure: LEFT HEART CATH AND CORONARY ANGIOGRAPHY;  Surgeon: Arleen Lacer, MD;  Location: [facility];  Service: Cardiovascular;   Laterality: N/A;   NEGATIVE SLEEP STUDY  04/01/2007   NEPHROLITHOTOMY Left 08/05/2013   Procedure: NEPHROLITHOTOMY PERCUTANEOUS;  Surgeon: Livingston Rigg, MD;  Location: [facility];  Service: Urology;  Laterality: Left;   POLYSOMNOGRAM  01/2019   NORMAL   POLYSOMNOGRAPHY  10/25/2018   Dr. Ivana Maris due to pt inadequat sleep time (83 min).   TONSILLECTOMY AND ADENOIDECTOMY  1975   TOTAL KNEE ARTHROPLASTY Right 2003   TOTAL KNEE REVISION Right 01/19/2022   Procedure: Right knee polyethylene revision;  Surgeon: Liliane Rei, MD;  Location: [facility];  Service: Orthopedics;  Laterality: Right;   TRANSTHORACIC ECHOCARDIOGRAM  05/2018; 12/03/2019   (after MI secondary to arterial spasm): EF 45-50%; severe hypokinesis of apical anterolateral, inferolateral , anterior, and inferior LV myocardium. 11/2019 EF 55-60%, valves fine   ULTRASOUND EXAM,PELVIC COMPLETE ([facility] HX)  06/25/2015   NORMAL (done by GYN)     Current Outpatient Medications:    albuterol  (VENTOLIN  HFA) 108 (90 Base) MCG/ACT inhaler, TAKE 2 PUFFS BY MOUTH EVERY 6 HOURS AS NEEDED FOR WHEEZE OR SHORTNESS OF BREATH, Disp: 18 each, Rfl: 1   ALPRAZolam  (XANAX ) 0.5 MG tablet, TAKE 1 TABLET BY MOUTH THREE TIMES A DAY AS NEEDED FOR ANXIETY, Disp: 90 tablet, Rfl: 5   atorvastatin  (LIPITOR ) 80 MG tablet, TAKE 1 TABLET BY MOUTH EVERY DAY, Disp: 90 tablet, Rfl: 1   buPROPion  (WELLBUTRIN  XL) 300 MG 24 hr tablet, TAKE 1 TABLET BY MOUTH EVERY DAY, Disp: 90 tablet, Rfl: 1   citalopram  (CELEXA ) 20 MG tablet, Take 1 tablet (20 mg total) by mouth in the morning and at bedtime., Disp: , Rfl:    citalopram  (CELEXA ) 40 MG tablet, TAKE 1 TABLET BY MOUTH EVERY DAY, Disp: 30 tablet, Rfl: 3   famotidine  (PEPCID ) 40 MG tablet, TAKE 1 TABLET BY MOUTH EVERY DAY, Disp: 90 tablet, Rfl: 1   fluticasone  (FLONASE ) 50 MCG/ACT nasal spray, SPRAY 2 SPRAYS INTO EACH NOSTRIL EVERY DAY, Disp: 48 mL, Rfl: 1   hydrochlorothiazide  (HYDRODIURIL ) 12.5 MG tablet, 2  tabs po qd, Disp: , Rfl:    HYDROcodone -acetaminophen  (NORCO/VICODIN) 5-325 MG tablet, 1-2 tab po bid as needed for pain, Disp: 90 tablet, Rfl: 0   lactulose  (CHRONULAC ) 10 GM/15ML solution, Take 30 mLs (20 g total) by mouth daily. (Patient not taking: Reported on 08/25/2023), Disp: 236 mL, Rfl: 3   lamoTRIgine  (LAMICTAL ) 100 MG tablet, TAKE 3 TABLETS (300 MG TOTAL) BY MOUTH AT BEDTIME, Disp: 270 tablet, Rfl: 2   loratadine  (CLARITIN ) 10 MG tablet, Take 10 mg by mouth daily.,  Disp: , Rfl:    losartan  (COZAAR ) 25 MG tablet, 1 tab po bid, Disp: 180 tablet, Rfl: 3   meloxicam  (MOBIC ) 15 MG tablet, TAKE 1 TABLET BY MOUTH ONCE DAILY AS NEEDED FOR JOINT PAIN, Disp: 30 tablet, Rfl: 2   metoprolol  succinate (TOPROL -XL) 100 MG 24 hr tablet, 1 and 1/2 tabs p.o. daily.  Take with or immediately following a meal., Disp: 135 tablet, Rfl: 1   montelukast  (SINGULAIR ) 10 MG tablet, TAKE 1 TABLET BY MOUTH EVERY DAY IN THE EVENING, Disp: 90 tablet, Rfl: 1   nitroGLYCERIN  (NITROSTAT ) 0.4 MG SL tablet, PLACE 1 TABLET (0.4 MG TOTAL) UNDER THE TONGUE EVERY 5 (FIVE) MINUTES X 3 DOSES AS NEEDED FOR CHEST PAIN. (Patient not taking: Reported on 08/25/2023), Disp: 25 tablet, Rfl: 1   ondansetron  (ZOFRAN -ODT) 4 MG disintegrating tablet, Take 1 tablet (4 mg total) by mouth every 8 (eight) hours as needed., Disp: 8 tablet, Rfl: 0   pantoprazole  (PROTONIX ) 40 MG tablet, TAKE 1 TABLET (40 MG TOTAL) BY MOUTH DAILY. KEEP F/U APPT FOR ANY FURTHER REFILLS, Disp: 90 tablet, Rfl: 0   potassium chloride  SA (KLOR-CON  M) 20 MEQ tablet, TAKE 1 TABLET BY MOUTH EVERY DAY, Disp: 90 tablet, Rfl: 1   Tiotropium Bromide -Olodaterol (STIOLTO RESPIMAT ) 2.5-2.5 MCG/ACT AERS, Inhale 2 each into the lungs daily., Disp: 4 g, Rfl: 5  EXAM:  VITALS per patient if applicable:     08/25/2023    1:02 PM 08/11/2023    1:01 PM 08/11/2023   11:55 AM  Vitals with BMI  Systolic 145 129 782  Diastolic 87 73 81    GENERAL: alert, oriented, appears well and in  no acute distress  HEENT: atraumatic, conjunttiva clear, no obvious abnormalities on inspection of external nose and ears  NECK: normal movements of the head and neck  LUNGS: on inspection no signs of respiratory distress, breathing rate appears normal, no obvious gross SOB, gasping or wheezing  CV: no obvious cyanosis  MS: moves all visible extremities without noticeable abnormality  PSYCH/NEURO: pleasant and cooperative, no obvious depression or anxiety, speech and thought processing grossly intact  LABS: none today   Chemistry      Component Value Date/Time   NA 135 07/25/2023 1016   NA 135 12/03/2019 1103   K 3.7 07/25/2023 1016   CL 100 07/25/2023 1016   CO2 26 07/25/2023 1016   BUN 18 07/25/2023 1016   BUN 8 12/03/2019 1103   CREATININE 0.91 07/25/2023 1016   CREATININE 1.02 (H) 06/09/2023 1159      Component Value Date/Time   CALCIUM  9.0 07/25/2023 1016   ALKPHOS 63 07/25/2023 1016   AST 13 (L) 07/25/2023 1016   ALT 19 07/25/2023 1016   BILITOT 0.9 07/25/2023 1016   BILITOT 0.7 09/25/2018 1007     Lab Results  Component Value Date   WBC 9.6 07/25/2023   HGB 10.2 (L) 07/25/2023   HCT 30.6 (L) 07/25/2023   MCV 93.0 07/25/2023   PLT 280 07/25/2023   Lab Results  Component Value Date   IRON 64 02/10/2023   TIBC 256 02/10/2023   FERRITIN 234 02/10/2023   ASSESSMENT AND PLAN:  Discussed the following assessment and plan:  #1 uncontrolled hypertension: Add back 5 mg amlodipine  per day. Continue Toprol -XL 200 mg a day, HCTZ 25 mg a day, losartan  50 mg twice a day.  #2 bipolar affective disorder with GAD. She has been requiring a little bit more alprazolam  per day lately. Will change  her prescription to reflect this--> 0.5 mg alprazolam , 1-2 3 times daily as needed, #180. Continue citalopram  40 mg a day, Wellbutrin  XL 300 mg a day, and Lamictal  300 mg a day.  MYCHART INSTRUCTIONS: You should be taking your blood pressure medications the following  way:  Metoprolol  succinate and (also called Toprol -XL) 100 mg tab, 2 tabs every day. Losartan  (also called cozaar ), take 2 of the 25 mg tabs twice per day. Hydrochlorothiazide  (also called HCTZ) 12.5 mg tabs, take 2/day.   F/u: 1 week  Signed:  Arletha Lady, MD           09/01/2023

## 2023-09-01 NOTE — Patient Instructions (Addendum)
 You should be taking your blood pressure medications the following way:  Metoprolol  succinate and (also called Toprol -XL) 100 mg tab, 2 tabs every day. Losartan  (also called cozaar ), take 2 of the 25 mg tabs twice per day. Hydrochlorothiazide  (also called HCTZ) 12.5 mg tabs, take 2/day.

## 2023-09-04 DIAGNOSIS — S52501D Unspecified fracture of the lower end of right radius, subsequent encounter for closed fracture with routine healing: Secondary | ICD-10-CM | POA: Diagnosis not present

## 2023-09-05 DIAGNOSIS — K5909 Other constipation: Secondary | ICD-10-CM | POA: Diagnosis not present

## 2023-09-05 DIAGNOSIS — I255 Ischemic cardiomyopathy: Secondary | ICD-10-CM | POA: Diagnosis not present

## 2023-09-05 DIAGNOSIS — K760 Fatty (change of) liver, not elsewhere classified: Secondary | ICD-10-CM | POA: Diagnosis not present

## 2023-09-05 DIAGNOSIS — I25111 Atherosclerotic heart disease of native coronary artery with angina pectoris with documented spasm: Secondary | ICD-10-CM | POA: Diagnosis not present

## 2023-09-05 DIAGNOSIS — F319 Bipolar disorder, unspecified: Secondary | ICD-10-CM | POA: Diagnosis not present

## 2023-09-05 DIAGNOSIS — G9340 Encephalopathy, unspecified: Secondary | ICD-10-CM | POA: Diagnosis not present

## 2023-09-05 DIAGNOSIS — Z8673 Personal history of transient ischemic attack (TIA), and cerebral infarction without residual deficits: Secondary | ICD-10-CM | POA: Diagnosis not present

## 2023-09-05 DIAGNOSIS — D631 Anemia in chronic kidney disease: Secondary | ICD-10-CM | POA: Diagnosis not present

## 2023-09-05 DIAGNOSIS — F419 Anxiety disorder, unspecified: Secondary | ICD-10-CM | POA: Diagnosis not present

## 2023-09-05 DIAGNOSIS — M80051D Age-related osteoporosis with current pathological fracture, right femur, subsequent encounter for fracture with routine healing: Secondary | ICD-10-CM | POA: Diagnosis not present

## 2023-09-05 DIAGNOSIS — M7542 Impingement syndrome of left shoulder: Secondary | ICD-10-CM | POA: Diagnosis not present

## 2023-09-05 DIAGNOSIS — G894 Chronic pain syndrome: Secondary | ICD-10-CM | POA: Diagnosis not present

## 2023-09-05 DIAGNOSIS — N183 Chronic kidney disease, stage 3 unspecified: Secondary | ICD-10-CM | POA: Diagnosis not present

## 2023-09-05 DIAGNOSIS — I129 Hypertensive chronic kidney disease with stage 1 through stage 4 chronic kidney disease, or unspecified chronic kidney disease: Secondary | ICD-10-CM | POA: Diagnosis not present

## 2023-09-05 DIAGNOSIS — M80031D Age-related osteoporosis with current pathological fracture, right forearm, subsequent encounter for fracture with routine healing: Secondary | ICD-10-CM | POA: Diagnosis not present

## 2023-09-05 DIAGNOSIS — Z85828 Personal history of other malignant neoplasm of skin: Secondary | ICD-10-CM | POA: Diagnosis not present

## 2023-09-05 DIAGNOSIS — Z9181 History of falling: Secondary | ICD-10-CM | POA: Diagnosis not present

## 2023-09-05 DIAGNOSIS — J449 Chronic obstructive pulmonary disease, unspecified: Secondary | ICD-10-CM | POA: Diagnosis not present

## 2023-09-05 NOTE — Telephone Encounter (Signed)
 Home health orders received 09/05/23 for Erin Lopez Home health initiation orders: Yes.  Home health re-certification orders: No. Patient last seen by ordering physician for this condition: 09/01/23. Must be less than 90 days for re-certification and less than 30 days prior for initiation. Visit must have been for the condition the orders are being placed.  Patient meets criteria for Physician to sign orders: Yes.        Current med list has been attached: No        Orders placed on physicians desk for signature: 09/05/23 (date) If patient does not meet criteria for orders to be signed: pt was called to schedule appt. Appt is scheduled for N/A.   Mirely Pangle D Autry Prust  Placed on PCP desk to review and sign, if appropriate.

## 2023-09-07 DIAGNOSIS — J449 Chronic obstructive pulmonary disease, unspecified: Secondary | ICD-10-CM | POA: Diagnosis not present

## 2023-09-07 DIAGNOSIS — Z85828 Personal history of other malignant neoplasm of skin: Secondary | ICD-10-CM | POA: Diagnosis not present

## 2023-09-07 DIAGNOSIS — D631 Anemia in chronic kidney disease: Secondary | ICD-10-CM | POA: Diagnosis not present

## 2023-09-07 DIAGNOSIS — Z9181 History of falling: Secondary | ICD-10-CM | POA: Diagnosis not present

## 2023-09-07 DIAGNOSIS — M80051D Age-related osteoporosis with current pathological fracture, right femur, subsequent encounter for fracture with routine healing: Secondary | ICD-10-CM | POA: Diagnosis not present

## 2023-09-07 DIAGNOSIS — I25111 Atherosclerotic heart disease of native coronary artery with angina pectoris with documented spasm: Secondary | ICD-10-CM | POA: Diagnosis not present

## 2023-09-07 DIAGNOSIS — F319 Bipolar disorder, unspecified: Secondary | ICD-10-CM | POA: Diagnosis not present

## 2023-09-07 DIAGNOSIS — M80031D Age-related osteoporosis with current pathological fracture, right forearm, subsequent encounter for fracture with routine healing: Secondary | ICD-10-CM | POA: Diagnosis not present

## 2023-09-07 DIAGNOSIS — I255 Ischemic cardiomyopathy: Secondary | ICD-10-CM | POA: Diagnosis not present

## 2023-09-07 DIAGNOSIS — I129 Hypertensive chronic kidney disease with stage 1 through stage 4 chronic kidney disease, or unspecified chronic kidney disease: Secondary | ICD-10-CM | POA: Diagnosis not present

## 2023-09-07 DIAGNOSIS — G894 Chronic pain syndrome: Secondary | ICD-10-CM | POA: Diagnosis not present

## 2023-09-07 DIAGNOSIS — K760 Fatty (change of) liver, not elsewhere classified: Secondary | ICD-10-CM | POA: Diagnosis not present

## 2023-09-07 DIAGNOSIS — K5909 Other constipation: Secondary | ICD-10-CM | POA: Diagnosis not present

## 2023-09-07 DIAGNOSIS — F419 Anxiety disorder, unspecified: Secondary | ICD-10-CM | POA: Diagnosis not present

## 2023-09-07 DIAGNOSIS — M7542 Impingement syndrome of left shoulder: Secondary | ICD-10-CM | POA: Diagnosis not present

## 2023-09-07 DIAGNOSIS — N183 Chronic kidney disease, stage 3 unspecified: Secondary | ICD-10-CM | POA: Diagnosis not present

## 2023-09-07 DIAGNOSIS — G9340 Encephalopathy, unspecified: Secondary | ICD-10-CM | POA: Diagnosis not present

## 2023-09-07 DIAGNOSIS — Z8673 Personal history of transient ischemic attack (TIA), and cerebral infarction without residual deficits: Secondary | ICD-10-CM | POA: Diagnosis not present

## 2023-09-08 DIAGNOSIS — Z9181 History of falling: Secondary | ICD-10-CM | POA: Diagnosis not present

## 2023-09-08 DIAGNOSIS — G894 Chronic pain syndrome: Secondary | ICD-10-CM | POA: Diagnosis not present

## 2023-09-08 DIAGNOSIS — N183 Chronic kidney disease, stage 3 unspecified: Secondary | ICD-10-CM | POA: Diagnosis not present

## 2023-09-08 DIAGNOSIS — M7542 Impingement syndrome of left shoulder: Secondary | ICD-10-CM | POA: Diagnosis not present

## 2023-09-08 DIAGNOSIS — M80031D Age-related osteoporosis with current pathological fracture, right forearm, subsequent encounter for fracture with routine healing: Secondary | ICD-10-CM | POA: Diagnosis not present

## 2023-09-08 DIAGNOSIS — F319 Bipolar disorder, unspecified: Secondary | ICD-10-CM | POA: Diagnosis not present

## 2023-09-08 DIAGNOSIS — I25111 Atherosclerotic heart disease of native coronary artery with angina pectoris with documented spasm: Secondary | ICD-10-CM | POA: Diagnosis not present

## 2023-09-08 DIAGNOSIS — F419 Anxiety disorder, unspecified: Secondary | ICD-10-CM | POA: Diagnosis not present

## 2023-09-08 DIAGNOSIS — G9340 Encephalopathy, unspecified: Secondary | ICD-10-CM | POA: Diagnosis not present

## 2023-09-08 DIAGNOSIS — K5909 Other constipation: Secondary | ICD-10-CM | POA: Diagnosis not present

## 2023-09-08 DIAGNOSIS — Z8673 Personal history of transient ischemic attack (TIA), and cerebral infarction without residual deficits: Secondary | ICD-10-CM | POA: Diagnosis not present

## 2023-09-08 DIAGNOSIS — Z85828 Personal history of other malignant neoplasm of skin: Secondary | ICD-10-CM | POA: Diagnosis not present

## 2023-09-08 DIAGNOSIS — I255 Ischemic cardiomyopathy: Secondary | ICD-10-CM | POA: Diagnosis not present

## 2023-09-08 DIAGNOSIS — M80051D Age-related osteoporosis with current pathological fracture, right femur, subsequent encounter for fracture with routine healing: Secondary | ICD-10-CM | POA: Diagnosis not present

## 2023-09-08 DIAGNOSIS — J449 Chronic obstructive pulmonary disease, unspecified: Secondary | ICD-10-CM | POA: Diagnosis not present

## 2023-09-08 DIAGNOSIS — I129 Hypertensive chronic kidney disease with stage 1 through stage 4 chronic kidney disease, or unspecified chronic kidney disease: Secondary | ICD-10-CM | POA: Diagnosis not present

## 2023-09-08 DIAGNOSIS — K760 Fatty (change of) liver, not elsewhere classified: Secondary | ICD-10-CM | POA: Diagnosis not present

## 2023-09-08 DIAGNOSIS — D631 Anemia in chronic kidney disease: Secondary | ICD-10-CM | POA: Diagnosis not present

## 2023-09-12 DIAGNOSIS — Z8673 Personal history of transient ischemic attack (TIA), and cerebral infarction without residual deficits: Secondary | ICD-10-CM | POA: Diagnosis not present

## 2023-09-12 DIAGNOSIS — K760 Fatty (change of) liver, not elsewhere classified: Secondary | ICD-10-CM | POA: Diagnosis not present

## 2023-09-12 DIAGNOSIS — D631 Anemia in chronic kidney disease: Secondary | ICD-10-CM | POA: Diagnosis not present

## 2023-09-12 DIAGNOSIS — G9340 Encephalopathy, unspecified: Secondary | ICD-10-CM | POA: Diagnosis not present

## 2023-09-12 DIAGNOSIS — Z85828 Personal history of other malignant neoplasm of skin: Secondary | ICD-10-CM | POA: Diagnosis not present

## 2023-09-12 DIAGNOSIS — M80031D Age-related osteoporosis with current pathological fracture, right forearm, subsequent encounter for fracture with routine healing: Secondary | ICD-10-CM | POA: Diagnosis not present

## 2023-09-12 DIAGNOSIS — G894 Chronic pain syndrome: Secondary | ICD-10-CM | POA: Diagnosis not present

## 2023-09-12 DIAGNOSIS — F319 Bipolar disorder, unspecified: Secondary | ICD-10-CM | POA: Diagnosis not present

## 2023-09-12 DIAGNOSIS — N183 Chronic kidney disease, stage 3 unspecified: Secondary | ICD-10-CM | POA: Diagnosis not present

## 2023-09-12 DIAGNOSIS — F419 Anxiety disorder, unspecified: Secondary | ICD-10-CM | POA: Diagnosis not present

## 2023-09-12 DIAGNOSIS — J449 Chronic obstructive pulmonary disease, unspecified: Secondary | ICD-10-CM | POA: Diagnosis not present

## 2023-09-12 DIAGNOSIS — I129 Hypertensive chronic kidney disease with stage 1 through stage 4 chronic kidney disease, or unspecified chronic kidney disease: Secondary | ICD-10-CM | POA: Diagnosis not present

## 2023-09-12 DIAGNOSIS — M80051D Age-related osteoporosis with current pathological fracture, right femur, subsequent encounter for fracture with routine healing: Secondary | ICD-10-CM | POA: Diagnosis not present

## 2023-09-12 DIAGNOSIS — I25111 Atherosclerotic heart disease of native coronary artery with angina pectoris with documented spasm: Secondary | ICD-10-CM | POA: Diagnosis not present

## 2023-09-12 DIAGNOSIS — M7542 Impingement syndrome of left shoulder: Secondary | ICD-10-CM | POA: Diagnosis not present

## 2023-09-12 DIAGNOSIS — K5909 Other constipation: Secondary | ICD-10-CM | POA: Diagnosis not present

## 2023-09-12 DIAGNOSIS — Z9181 History of falling: Secondary | ICD-10-CM | POA: Diagnosis not present

## 2023-09-12 DIAGNOSIS — I255 Ischemic cardiomyopathy: Secondary | ICD-10-CM | POA: Diagnosis not present

## 2023-09-13 ENCOUNTER — Encounter: Payer: Self-pay | Admitting: Family Medicine

## 2023-09-13 ENCOUNTER — Telehealth: Payer: Self-pay

## 2023-09-13 ENCOUNTER — Telehealth (INDEPENDENT_AMBULATORY_CARE_PROVIDER_SITE_OTHER): Admitting: Family Medicine

## 2023-09-13 DIAGNOSIS — K5909 Other constipation: Secondary | ICD-10-CM | POA: Diagnosis not present

## 2023-09-13 DIAGNOSIS — F319 Bipolar disorder, unspecified: Secondary | ICD-10-CM | POA: Diagnosis not present

## 2023-09-13 DIAGNOSIS — M25551 Pain in right hip: Secondary | ICD-10-CM

## 2023-09-13 DIAGNOSIS — F411 Generalized anxiety disorder: Secondary | ICD-10-CM

## 2023-09-13 DIAGNOSIS — G8929 Other chronic pain: Secondary | ICD-10-CM

## 2023-09-13 DIAGNOSIS — Z8673 Personal history of transient ischemic attack (TIA), and cerebral infarction without residual deficits: Secondary | ICD-10-CM | POA: Diagnosis not present

## 2023-09-13 DIAGNOSIS — I10 Essential (primary) hypertension: Secondary | ICD-10-CM | POA: Diagnosis not present

## 2023-09-13 DIAGNOSIS — F419 Anxiety disorder, unspecified: Secondary | ICD-10-CM | POA: Diagnosis not present

## 2023-09-13 DIAGNOSIS — I255 Ischemic cardiomyopathy: Secondary | ICD-10-CM | POA: Diagnosis not present

## 2023-09-13 DIAGNOSIS — M80031D Age-related osteoporosis with current pathological fracture, right forearm, subsequent encounter for fracture with routine healing: Secondary | ICD-10-CM | POA: Diagnosis not present

## 2023-09-13 DIAGNOSIS — I25111 Atherosclerotic heart disease of native coronary artery with angina pectoris with documented spasm: Secondary | ICD-10-CM | POA: Diagnosis not present

## 2023-09-13 DIAGNOSIS — I129 Hypertensive chronic kidney disease with stage 1 through stage 4 chronic kidney disease, or unspecified chronic kidney disease: Secondary | ICD-10-CM | POA: Diagnosis not present

## 2023-09-13 DIAGNOSIS — G9340 Encephalopathy, unspecified: Secondary | ICD-10-CM | POA: Diagnosis not present

## 2023-09-13 DIAGNOSIS — Z9181 History of falling: Secondary | ICD-10-CM | POA: Diagnosis not present

## 2023-09-13 DIAGNOSIS — N183 Chronic kidney disease, stage 3 unspecified: Secondary | ICD-10-CM | POA: Diagnosis not present

## 2023-09-13 DIAGNOSIS — M80051D Age-related osteoporosis with current pathological fracture, right femur, subsequent encounter for fracture with routine healing: Secondary | ICD-10-CM | POA: Diagnosis not present

## 2023-09-13 DIAGNOSIS — K760 Fatty (change of) liver, not elsewhere classified: Secondary | ICD-10-CM | POA: Diagnosis not present

## 2023-09-13 DIAGNOSIS — G894 Chronic pain syndrome: Secondary | ICD-10-CM | POA: Diagnosis not present

## 2023-09-13 DIAGNOSIS — M7542 Impingement syndrome of left shoulder: Secondary | ICD-10-CM | POA: Diagnosis not present

## 2023-09-13 DIAGNOSIS — J449 Chronic obstructive pulmonary disease, unspecified: Secondary | ICD-10-CM | POA: Diagnosis not present

## 2023-09-13 DIAGNOSIS — Z85828 Personal history of other malignant neoplasm of skin: Secondary | ICD-10-CM | POA: Diagnosis not present

## 2023-09-13 DIAGNOSIS — D631 Anemia in chronic kidney disease: Secondary | ICD-10-CM | POA: Diagnosis not present

## 2023-09-13 NOTE — Progress Notes (Signed)
 Virtual Visit via Video Note  I connected with Erin Lopez  on 09/13/23 at  9:40 AM EDT by a video enabled telemedicine application and verified that I am speaking with the correct person using two identifiers.  Location patient: [place] Location provider:work or home office Persons participating in the virtual visit: patient, provider  I discussed the limitations and requested verbal permission for telemedicine visit. The patient expressed understanding and agreed to proceed.   HPI: 73 year old female being seen today for medication questions/concerns. When I saw her 12 days ago we restarted her on amlodipine  5 mg a day.  We also kept her on Toprol -XL 200 mg a day and HCTZ 25 mg a day and losartan  50 mg twice a day.  She expressed concern/confusion that the amlodipine  is not on her med list when she looks on MyChart. She has been taking the 5 mg as directed. Blood pressures sometimes down into the 120s to 130s over 80s but also sometimes up into the 150s over 80s.  Mood is somewhat depressed but sounds like her main problem is boredom and poor motivation.  She has been at home a lot lately.  She has lent her car to her daughter and cannot leave the house now.   Is getting home health PT for her right hip pain and getting some OT for her wrist. She prefers to now start the PT at [facility] and stop the home health PT.  ROS: See pertinent positives and negatives per HPI.  Past Medical History:  Diagnosis Date   Acute encephalopathy 11/19/2017   Alcoholism in remission [facility])    states last alcohol 11 yrs ago   Anemia    Anxiety    Bipolar 1 disorder (HCC)    Admission for mania x 2 (most recent 11/2017)   Cervical spondylosis without myelopathy 12/19/2013   MRI 02/2014: multilevel DDD/spondylosis, not much change compared to prior MRI.  Pt not in favor of invasive therapy for her neck as of 04/2014.   Chronic constipation    Chronic renal insufficiency, stage 3 (moderate) (HCC)    COPD  (chronic obstructive pulmonary disease) (HCC)    Moderate: anoro started 04/2017 by pulm, pt symptomatically improved and PFTs stable at f/u 06/2017.   Coronary artery spasm (HCC) 07/11/2018   nitrates=HA. Amlodipine =intol swelling.  Changed to coreg  09/03/18 by cardiologist.   Depression    Endometritis 05/2017   possible; empiric tx with Flagyl  by GYN.   Femoral neck fracture (HCC)    right, 01/28/23-->ORIF   Fibromyalgia    GERD (gastroesophageal reflux disease)    Hepatic steatosis 03/2019   u/s abd   Herpes zoster 08/07/2017   L scapular region   Hiatal hernia    History of adenomatous polyp of colon 04/2008; 08/2014   No high grade dysplasia: recall 5 yrs (Dr. Honey Lusty, [facility])   History of basal cell carcinoma excision    NOSE   History of Helicobacter pylori infection 04/2008   +gastric biopsy (gastritis but no metaplasia, dysplasia, or malignancy identified)   History of kidney stones    History of TIA (transient ischemic attack)    secondary to HELLP syndrome 1994 post c/s--  residual memory loss   Hyperlipidemia    statin started after her NSTEMI: goal  LDL <70.   Hypertension    Ischemic cardiomyopathy 05/2018   Echo EF 45-50% in context of NSTEMI from CA spasm. // Echo 8/21: 55-60, mild LVH, normal RVSF, RVSP 40, trivial MR  Left foot pain    Hallux deformity+adhesive capsulitis+ hammertoe:  Severe structural bunion deformity with hallux interphalangeus and severe arthritis of the second MPJ left foot--surgical repair/osteotomies by Dr. [name] 04/2017.   Memory loss    Abnl MRI brain and CT brain c/w chronic microvascular ischemia.  Repeat MRI brain 02/2014 stable (Dr. Levell Reach with no plan to f/u with neuro as of 04/2014)   NSTEMI (non-ST elevated myocardial infarction) (HCC) 05/2018   Echo EF 45-50% in context of NSTEMI from CA spasm.// Myoview  8/21: EF 66, no ischemia, low risk   Osteoarthritis of left wrist    STT and 1st [facility] jt   Osteoporosis 05/2010   2012  'penia; 06/2015 'porosis:  prolia .  DEXA 09/2017 T-score -2.2 femur neck. 11/2021 T score -2.2. Rpt 2 yrs   Pelvic floor dysfunction    [facility] (Dr. Dulcy Gibney)   Postmenopausal vaginal bleeding 05/2017   x 1 small episode; GYN attempted endo bx but unable to penetrate cervix due to severe cerv stenosis (due to postmenopausal state + hx of LEEP).  Endo u/s showed thin endo lining.  Per GYN---no evidence of endomet pathology on w/u---obs as of 07/2017.   Prediabetes 06/2016   Fasting gluc 105; HbA1c at that time was 6.0%.  A1c 6.2% 12/2017.  A1c 5.7% Feb 2023.   Recurrent kidney stones 08/2013   Left ureteral calculus: Perc nephr--cystoscopy w/ureteroscopy + laser for stone removal.  Residual asymptomatic left renal nephrolithiasis <50mm present post-procedure.  Right sided hydronephrosis-persistent (on u/s)--urol ordered CT to further eval 08/2016: 1.6 cm nonobstructing stone lower pole left kidney, no hydro, plan for PCN extraction ([facility])   Sepsis (HCC)    UTI   Suprapubic discomfort 06/05/2015   TMJ (temporomandibular joint disorder)    USES  MOUTH GUARD   Toe fracture, left 12/2017   2nd toe prox phalanx (immobilization, Dr. Seward Dao)   Vitamin D deficiency 05/2011    Past Surgical History:  Procedure Laterality Date   ABIs  11/29/2019   NORMAL   ANTERIOR CERVICAL DECOMP/DISCECTOMY FUSION  06/30/2009   C3 -- C5  AND EXPLORATION OF FUSION C5-7 W/  PLATE REMOVAL   ARTHRODESIS METATARSALPHALANGEAL JOINT (MTPJ) Left 07/08/2021   Procedure: Left hallux metatarsophalangeal joint arthrodesis;  Surgeon: Amada Backer, MD;  Location: [facility];  Service: Orthopedics;  Laterality: Left;   BACK SURGERY     BUNIONECTOMY Left    CARDIOVASCULAR STRESS TEST  12/03/2019   myoc perf im: NORMAL   CERVICAL FUSION  2002   anterior C5 -- C7   CERVICAL SPINE SURGERY  08/27/1999     C6 -- T1  LAMINECTOMY/  DISKECTOMY   CESAREAN SECTION  1994   CHOLECYSTECTOMY N/A 03/06/2020   Procedure:  LAPAROSCOPIC CHOLECYSTECTOMY;  Surgeon: Junie Olds, MD;  Location: [facility];  Service: General;  Laterality: N/A;   COLONOSCOPY W/ POLYPECTOMY  04/2008;08/2014   2016 tubular adenoma x 1; +diverticulosis and int/ext hemorrhoids.  06/2020 adenoma, recall 5 yrs.   CYSTOSCOPY WITH URETEROSCOPY AND STENT PLACEMENT Left 08/28/2013   Procedure: CYSTOSCOPY, RGP,  WITH URETEROSCOPY AND STENT REMOVAL;  Surgeon: Livingston Rigg, MD;  Location: [facility];  Service: Urology;  Laterality: Left;   DEXA  09/2017   T-score -2.2 femur neck: improved compared to 06/2015.  2021 DEXA T score -2.3.  11/2021 DEXA T score -2.2.   ESOPHAGOGASTRODUODENOSCOPY  2010; 08/2014   2016 +Candidal esophagitis; Mild chronic gastritis w/intestinal metaplasia--NEG H pylori, neg for eosinophilic  esoph. 06/2020 esoph candidiasis (diflucan  x 20d), o/w normal.   HAMMER TOE SURGERY Left 07/08/2021   Procedure: Second metatarsal head excision; revision Second hammertoe correction; 2-5 percutaneus flexor tenotomies;  Surgeon: Amada Backer, MD;  Location: [facility];  Service: Orthopedics;  Laterality: Left;   HARDWARE REMOVAL Left 07/08/2021   Procedure: Removal of deep implants from hallux proximal phalanx and First and Second metatarsals;  Surgeon: Amada Backer, MD;  Location: [facility];  Service: Orthopedics;  Laterality: Left;   HIP PINNING,CANNULATED Right 01/28/2023   Procedure: PERCUTANEOUS FIXATION OF RIGHT FEMORAL NECK;  Surgeon: Janeth Medicus, MD;  Location: [facility] OR;  Service: Orthopedics;  Laterality: Right;   HOLMIUM LASER APPLICATION Left 08/28/2013   Procedure: HOLMIUM LASER APPLICATION;  Surgeon: Livingston Rigg, MD;  Location[facility];  Service: Urology;  Laterality: Left;   LEFT HEART CATH AND CORONARY ANGIOGRAPHY N/A 05/15/2018   Evidence for spasm in distal LAD.  Mod RCA atherosclerosis, o/w no CAD.  Procedure: LEFT HEART CATH AND CORONARY ANGIOGRAPHY;   Surgeon: Arleen Lacer, MD;  [facility];  Service: Cardiovascular;  Laterality: N/A;   NEGATIVE SLEEP STUDY  04/01/2007   NEPHROLITHOTOMY Left 08/05/2013   Procedure: NEPHROLITHOTOMY PERCUTANEOUS;  Surgeon: Livingston Rigg, MD;  Location: [facility];  Service: Urology;  Laterality: Left;   POLYSOMNOGRAM  01/2019   NORMAL   POLYSOMNOGRAPHY  10/25/2018   Dr. Ivana Maris due to pt inadequat sleep time (83 min).   TONSILLECTOMY AND ADENOIDECTOMY  1975   TOTAL KNEE ARTHROPLASTY Right 2003   TOTAL KNEE REVISION Right 01/19/2022   Procedure: Right knee polyethylene revision;  Surgeon: Liliane Rei, MD;  Location: [facility];  Service: Orthopedics;  Laterality: Right;   TRANSTHORACIC ECHOCARDIOGRAM  05/2018; 12/03/2019   (after MI secondary to arterial spasm): EF 45-50%; severe hypokinesis of apical anterolateral, inferolateral , anterior, and inferior LV myocardium. 11/2019 EF 55-60%, valves fine   ULTRASOUND EXAM,PELVIC COMPLETE ([facility] HX)  06/25/2015   NORMAL (done by GYN)     Current Outpatient Medications:    albuterol  (VENTOLIN  HFA) 108 (90 Base) MCG/ACT inhaler, TAKE 2 PUFFS BY MOUTH EVERY 6 HOURS AS NEEDED FOR WHEEZE OR SHORTNESS OF BREATH, Disp: 18 each, Rfl: 1   ALPRAZolam  (XANAX ) 0.5 MG tablet, TAKE 1-2 TABLETS BY MOUTH THREE TIMES A DAY AS NEEDED FOR ANXIETY, Disp: 180 tablet, Rfl: 5   amLODipine  (NORVASC ) 5 MG tablet, Take 1 tablet (5 mg total) by mouth daily., Disp: 30 tablet, Rfl: 0   atorvastatin  (LIPITOR ) 80 MG tablet, TAKE 1 TABLET BY MOUTH EVERY DAY, Disp: 90 tablet, Rfl: 1   buPROPion  (WELLBUTRIN  XL) 300 MG 24 hr tablet, TAKE 1 TABLET BY MOUTH EVERY DAY, Disp: 90 tablet, Rfl: 1   citalopram  (CELEXA ) 40 MG tablet, TAKE 1 TABLET BY MOUTH EVERY DAY, Disp: 30 tablet, Rfl: 3   famotidine  (PEPCID ) 40 MG tablet, TAKE 1 TABLET BY MOUTH EVERY DAY, Disp: 90 tablet, Rfl: 1   fluticasone  (FLONASE ) 50 MCG/ACT nasal spray, SPRAY 2 SPRAYS INTO EACH NOSTRIL EVERY  DAY, Disp: 48 mL, Rfl: 1   hydrochlorothiazide  (HYDRODIURIL ) 12.5 MG tablet, 2 tabs po qd, Disp: , Rfl:    HYDROcodone -acetaminophen  (NORCO/VICODIN) 5-325 MG tablet, 1-2 tab po bid as needed for pain, Disp: 90 tablet, Rfl: 0   lactulose  (CHRONULAC ) 10 GM/15ML solution, Take 30 mLs (20 g total) by mouth daily. (Patient not taking: Reported on 09/01/2023), Disp: 236 mL, Rfl: 3  lamoTRIgine  (LAMICTAL ) 100 MG tablet, TAKE 3 TABLETS (300 MG TOTAL) BY MOUTH AT BEDTIME, Disp: 270 tablet, Rfl: 2   loratadine  (CLARITIN ) 10 MG tablet, Take 10 mg by mouth daily., Disp: , Rfl:    losartan  (COZAAR ) 25 MG tablet, 1 tab po bid, Disp: 180 tablet, Rfl: 3   meloxicam  (MOBIC ) 15 MG tablet, TAKE 1 TABLET BY MOUTH ONCE DAILY AS NEEDED FOR JOINT PAIN, Disp: 30 tablet, Rfl: 2   metoprolol  succinate (TOPROL -XL) 100 MG 24 hr tablet, 1 and 1/2 tabs p.o. daily.  Take with or immediately following a meal., Disp: 135 tablet, Rfl: 1   montelukast  (SINGULAIR ) 10 MG tablet, TAKE 1 TABLET BY MOUTH EVERY DAY IN THE EVENING, Disp: 90 tablet, Rfl: 1   nitroGLYCERIN  (NITROSTAT ) 0.4 MG SL tablet, PLACE 1 TABLET (0.4 MG TOTAL) UNDER THE TONGUE EVERY 5 (FIVE) MINUTES X 3 DOSES AS NEEDED FOR CHEST PAIN. (Patient not taking: Reported on 09/01/2023), Disp: 25 tablet, Rfl: 1   ondansetron  (ZOFRAN -ODT) 4 MG disintegrating tablet, Take 1 tablet (4 mg total) by mouth every 8 (eight) hours as needed., Disp: 8 tablet, Rfl: 0   pantoprazole  (PROTONIX ) 40 MG tablet, TAKE 1 TABLET (40 MG TOTAL) BY MOUTH DAILY. KEEP F/U APPT FOR ANY FURTHER REFILLS, Disp: 90 tablet, Rfl: 0   potassium chloride  SA (KLOR-CON  M) 20 MEQ tablet, TAKE 1 TABLET BY MOUTH EVERY DAY, Disp: 90 tablet, Rfl: 1   Tiotropium Bromide -Olodaterol (STIOLTO RESPIMAT ) 2.5-2.5 MCG/ACT AERS, Inhale 2 each into the lungs daily., Disp: 4 g, Rfl: 5  EXAM:  VITALS per patient if applicable:     09/01/2023    2:56 PM 09/01/2023    2:54 PM 08/25/2023    1:02 PM  Vitals with BMI  Systolic 138  155 145  Diastolic 80 87 87  Pulse 77     GENERAL: alert, oriented, appears well and in no acute distress  HEENT: atraumatic, conjunttiva clear, no obvious abnormalities on inspection of external nose and ears  NECK: normal movements of the head and neck  LUNGS: on inspection no signs of respiratory distress, breathing rate appears normal, no obvious gross SOB, gasping or wheezing  CV: no obvious cyanosis  MS: moves all visible extremities without noticeable abnormality  PSYCH/NEURO: pleasant and cooperative, no obvious depression or anxiety, speech and thought processing grossly intact  LABS: none today    Chemistry      Component Value Date/Time   NA 135 07/25/2023 1016   NA 135 12/03/2019 1103   K 3.7 07/25/2023 1016   CL 100 07/25/2023 1016   CO2 26 07/25/2023 1016   BUN 18 07/25/2023 1016   BUN 8 12/03/2019 1103   CREATININE 0.91 07/25/2023 1016   CREATININE 1.02 (H) 06/09/2023 1159      Component Value Date/Time   CALCIUM  9.0 07/25/2023 1016   ALKPHOS 63 07/25/2023 1016   AST 13 (L) 07/25/2023 1016   ALT 19 07/25/2023 1016   BILITOT 0.9 07/25/2023 1016   BILITOT 0.7 09/25/2018 1007     ASSESSMENT AND PLAN:  Discussed the following assessment and plan:  #1 hypertension. Increase amlodipine  to 7.5 mg a day.  If blood pressure not in the 130s over 80s consistently in 3 days then increase to 10 mg.  Keep taking Toprol -XL 200 mg a day and HCTZ 25 mg a day and losartan  50 mg twice a day.  #2 bipolar affective disorder with GAD.  Stable.  She would benefit from being able to leave  the house, though.  She will be getting her car back from her daughter this weekend most likely. Continue citalopram  40 mg a day, Wellbutrin  XL 300 mg a day, and Lamictal  300 mg a day.  Also continue alprazolam  0.5 mg, 1-2 3 times daily as needed.  3.  Chronic right hip pain. She is getting home health PT but wants to switch back to outpatient PT at [facility]. Order placed  today.  I discussed the assessment and treatment plan with the patient. The patient was provided an opportunity to ask questions and all were answered. The patient agreed with the plan and demonstrated an understanding of the instructions.   F/u: 2 to 3 weeks  Signed:  Arletha Lady, MD           09/13/2023

## 2023-09-13 NOTE — Patient Instructions (Signed)
 Increase amlodipine  to 7.5 mg daily. If blood pressure not consistently in the 130s over the 80s in 3 days then increase amlodipine  to 10 mg a day.

## 2023-09-13 NOTE — Telephone Encounter (Signed)
 Home health orders #[id number] received 09/13/23 for Erin Lopez Home health initiation orders: Yes.  Home health re-certification orders: No. Patient last seen by ordering physician for this condition: 09/13/23. Must be less than 90 days for re-certification and less than 30 days prior for initiation. Visit must have been for the condition the orders are being placed.  Patient meets criteria for Physician to sign orders: Yes.        Current med list has been attached: No        Orders placed on physicians desk for signature: 09/13/23 (date) If patient does not meet criteria for orders to be signed: pt was called to schedule appt. Appt is scheduled for N/A.   Erin Lopez D Erin Lopez   Placed on PCP desk to review and sign, if appropriate.

## 2023-09-14 DIAGNOSIS — M80051D Age-related osteoporosis with current pathological fracture, right femur, subsequent encounter for fracture with routine healing: Secondary | ICD-10-CM | POA: Diagnosis not present

## 2023-09-14 DIAGNOSIS — K760 Fatty (change of) liver, not elsewhere classified: Secondary | ICD-10-CM | POA: Diagnosis not present

## 2023-09-14 DIAGNOSIS — K5909 Other constipation: Secondary | ICD-10-CM | POA: Diagnosis not present

## 2023-09-14 DIAGNOSIS — M7542 Impingement syndrome of left shoulder: Secondary | ICD-10-CM | POA: Diagnosis not present

## 2023-09-14 DIAGNOSIS — G9340 Encephalopathy, unspecified: Secondary | ICD-10-CM | POA: Diagnosis not present

## 2023-09-14 DIAGNOSIS — D631 Anemia in chronic kidney disease: Secondary | ICD-10-CM | POA: Diagnosis not present

## 2023-09-14 DIAGNOSIS — N183 Chronic kidney disease, stage 3 unspecified: Secondary | ICD-10-CM | POA: Diagnosis not present

## 2023-09-14 DIAGNOSIS — Z85828 Personal history of other malignant neoplasm of skin: Secondary | ICD-10-CM | POA: Diagnosis not present

## 2023-09-14 DIAGNOSIS — Z8673 Personal history of transient ischemic attack (TIA), and cerebral infarction without residual deficits: Secondary | ICD-10-CM | POA: Diagnosis not present

## 2023-09-14 DIAGNOSIS — F319 Bipolar disorder, unspecified: Secondary | ICD-10-CM | POA: Diagnosis not present

## 2023-09-14 DIAGNOSIS — M80031D Age-related osteoporosis with current pathological fracture, right forearm, subsequent encounter for fracture with routine healing: Secondary | ICD-10-CM | POA: Diagnosis not present

## 2023-09-14 DIAGNOSIS — G894 Chronic pain syndrome: Secondary | ICD-10-CM | POA: Diagnosis not present

## 2023-09-14 DIAGNOSIS — J449 Chronic obstructive pulmonary disease, unspecified: Secondary | ICD-10-CM | POA: Diagnosis not present

## 2023-09-14 DIAGNOSIS — I25111 Atherosclerotic heart disease of native coronary artery with angina pectoris with documented spasm: Secondary | ICD-10-CM | POA: Diagnosis not present

## 2023-09-14 DIAGNOSIS — Z9181 History of falling: Secondary | ICD-10-CM | POA: Diagnosis not present

## 2023-09-14 DIAGNOSIS — I129 Hypertensive chronic kidney disease with stage 1 through stage 4 chronic kidney disease, or unspecified chronic kidney disease: Secondary | ICD-10-CM | POA: Diagnosis not present

## 2023-09-14 DIAGNOSIS — F419 Anxiety disorder, unspecified: Secondary | ICD-10-CM | POA: Diagnosis not present

## 2023-09-14 DIAGNOSIS — I255 Ischemic cardiomyopathy: Secondary | ICD-10-CM | POA: Diagnosis not present

## 2023-09-17 ENCOUNTER — Other Ambulatory Visit: Payer: Self-pay | Admitting: Family Medicine

## 2023-09-19 ENCOUNTER — Telehealth: Payer: Self-pay | Admitting: Family Medicine

## 2023-09-19 MED ORDER — HYDROCHLOROTHIAZIDE 12.5 MG PO TABS: ORAL_TABLET | ORAL | 0 refills | Status: DC

## 2023-09-19 NOTE — Telephone Encounter (Signed)
 Copied from CRM [id number]. Topic: Clinical - Medication Refill >> Sep 19, 2023  2:29 PM Pam Bode wrote: Medication:  hydrochlorothiazide  (HYDRODIURIL ) 12.5 MG tablet  Has the patient contacted their pharmacy? No (Agent: If no, request that the patient contact the pharmacy for the refill. If patient does not wish to contact the pharmacy document the reason why and proceed with request.) (Agent: If yes, when and what did the pharmacy advise?)  This is the patient's preferred pharmacy:  [id number] [place], [place] - [address] [place] [address] Phone: [phone] Fax: [phone]  Is this the correct pharmacy for this prescription? Yes If no, delete pharmacy and type the correct one.   Has the prescription been filled recently? Yes  Is the patient out of the medication? No  Has the patient been seen for an appointment in the last year OR does the patient have an upcoming appointment? Yes  Can we respond through MyChart? Yes  Agent: Please be advised that Rx refills may take up to 3 business days. We ask that you follow-up with your pharmacy.

## 2023-09-19 NOTE — Telephone Encounter (Signed)
 LVM for pt to return call

## 2023-09-19 NOTE — Telephone Encounter (Unsigned)
 Copied from CRM [id number]. Topic: Clinical - Medication Question >> Sep 19, 2023  2:35 PM Pam Bode wrote: Reason for CRM: Patient called in wanting to know why she is prescribed two different medications.

## 2023-09-20 DIAGNOSIS — I255 Ischemic cardiomyopathy: Secondary | ICD-10-CM | POA: Diagnosis not present

## 2023-09-20 DIAGNOSIS — G9340 Encephalopathy, unspecified: Secondary | ICD-10-CM | POA: Diagnosis not present

## 2023-09-20 DIAGNOSIS — I25111 Atherosclerotic heart disease of native coronary artery with angina pectoris with documented spasm: Secondary | ICD-10-CM | POA: Diagnosis not present

## 2023-09-20 DIAGNOSIS — I129 Hypertensive chronic kidney disease with stage 1 through stage 4 chronic kidney disease, or unspecified chronic kidney disease: Secondary | ICD-10-CM | POA: Diagnosis not present

## 2023-09-20 DIAGNOSIS — Z9181 History of falling: Secondary | ICD-10-CM | POA: Diagnosis not present

## 2023-09-20 DIAGNOSIS — K5909 Other constipation: Secondary | ICD-10-CM | POA: Diagnosis not present

## 2023-09-20 DIAGNOSIS — Z8673 Personal history of transient ischemic attack (TIA), and cerebral infarction without residual deficits: Secondary | ICD-10-CM | POA: Diagnosis not present

## 2023-09-20 DIAGNOSIS — F419 Anxiety disorder, unspecified: Secondary | ICD-10-CM | POA: Diagnosis not present

## 2023-09-20 DIAGNOSIS — G894 Chronic pain syndrome: Secondary | ICD-10-CM | POA: Diagnosis not present

## 2023-09-20 DIAGNOSIS — M80031D Age-related osteoporosis with current pathological fracture, right forearm, subsequent encounter for fracture with routine healing: Secondary | ICD-10-CM | POA: Diagnosis not present

## 2023-09-20 DIAGNOSIS — N183 Chronic kidney disease, stage 3 unspecified: Secondary | ICD-10-CM | POA: Diagnosis not present

## 2023-09-20 DIAGNOSIS — D631 Anemia in chronic kidney disease: Secondary | ICD-10-CM | POA: Diagnosis not present

## 2023-09-20 DIAGNOSIS — M80051D Age-related osteoporosis with current pathological fracture, right femur, subsequent encounter for fracture with routine healing: Secondary | ICD-10-CM | POA: Diagnosis not present

## 2023-09-20 DIAGNOSIS — Z85828 Personal history of other malignant neoplasm of skin: Secondary | ICD-10-CM | POA: Diagnosis not present

## 2023-09-20 DIAGNOSIS — K760 Fatty (change of) liver, not elsewhere classified: Secondary | ICD-10-CM | POA: Diagnosis not present

## 2023-09-20 DIAGNOSIS — J449 Chronic obstructive pulmonary disease, unspecified: Secondary | ICD-10-CM | POA: Diagnosis not present

## 2023-09-20 DIAGNOSIS — M7542 Impingement syndrome of left shoulder: Secondary | ICD-10-CM | POA: Diagnosis not present

## 2023-09-20 DIAGNOSIS — F319 Bipolar disorder, unspecified: Secondary | ICD-10-CM | POA: Diagnosis not present

## 2023-09-21 DIAGNOSIS — I25111 Atherosclerotic heart disease of native coronary artery with angina pectoris with documented spasm: Secondary | ICD-10-CM | POA: Diagnosis not present

## 2023-09-21 DIAGNOSIS — D631 Anemia in chronic kidney disease: Secondary | ICD-10-CM | POA: Diagnosis not present

## 2023-09-21 DIAGNOSIS — M80051D Age-related osteoporosis with current pathological fracture, right femur, subsequent encounter for fracture with routine healing: Secondary | ICD-10-CM | POA: Diagnosis not present

## 2023-09-21 DIAGNOSIS — G9340 Encephalopathy, unspecified: Secondary | ICD-10-CM | POA: Diagnosis not present

## 2023-09-21 DIAGNOSIS — Z8673 Personal history of transient ischemic attack (TIA), and cerebral infarction without residual deficits: Secondary | ICD-10-CM | POA: Diagnosis not present

## 2023-09-21 DIAGNOSIS — K760 Fatty (change of) liver, not elsewhere classified: Secondary | ICD-10-CM | POA: Diagnosis not present

## 2023-09-21 DIAGNOSIS — Z85828 Personal history of other malignant neoplasm of skin: Secondary | ICD-10-CM | POA: Diagnosis not present

## 2023-09-21 DIAGNOSIS — N183 Chronic kidney disease, stage 3 unspecified: Secondary | ICD-10-CM | POA: Diagnosis not present

## 2023-09-21 DIAGNOSIS — M7542 Impingement syndrome of left shoulder: Secondary | ICD-10-CM | POA: Diagnosis not present

## 2023-09-21 DIAGNOSIS — I255 Ischemic cardiomyopathy: Secondary | ICD-10-CM | POA: Diagnosis not present

## 2023-09-21 DIAGNOSIS — G894 Chronic pain syndrome: Secondary | ICD-10-CM | POA: Diagnosis not present

## 2023-09-21 DIAGNOSIS — F419 Anxiety disorder, unspecified: Secondary | ICD-10-CM | POA: Diagnosis not present

## 2023-09-21 DIAGNOSIS — I129 Hypertensive chronic kidney disease with stage 1 through stage 4 chronic kidney disease, or unspecified chronic kidney disease: Secondary | ICD-10-CM | POA: Diagnosis not present

## 2023-09-21 DIAGNOSIS — Z9181 History of falling: Secondary | ICD-10-CM | POA: Diagnosis not present

## 2023-09-21 DIAGNOSIS — K5909 Other constipation: Secondary | ICD-10-CM | POA: Diagnosis not present

## 2023-09-21 DIAGNOSIS — F319 Bipolar disorder, unspecified: Secondary | ICD-10-CM | POA: Diagnosis not present

## 2023-09-21 DIAGNOSIS — M80031D Age-related osteoporosis with current pathological fracture, right forearm, subsequent encounter for fracture with routine healing: Secondary | ICD-10-CM | POA: Diagnosis not present

## 2023-09-21 DIAGNOSIS — J449 Chronic obstructive pulmonary disease, unspecified: Secondary | ICD-10-CM | POA: Diagnosis not present

## 2023-09-24 ENCOUNTER — Other Ambulatory Visit: Payer: Self-pay | Admitting: Family Medicine

## 2023-09-25 ENCOUNTER — Other Ambulatory Visit: Payer: Self-pay

## 2023-09-25 MED ORDER — AMLODIPINE BESYLATE 5 MG PO TABS: 7.5000 mg | ORAL_TABLET | Freq: Every day | ORAL | 0 refills | Status: DC

## 2023-09-28 DIAGNOSIS — S52501D Unspecified fracture of the lower end of right radius, subsequent encounter for closed fracture with routine healing: Secondary | ICD-10-CM | POA: Diagnosis not present

## 2023-10-09 ENCOUNTER — Ambulatory Visit: Admitting: Family Medicine

## 2023-10-09 ENCOUNTER — Ambulatory Visit: Payer: Self-pay

## 2023-10-09 ENCOUNTER — Encounter: Payer: Self-pay | Admitting: Family Medicine

## 2023-10-09 VITALS — BP 104/64 | HR 67 | Temp 97.8°F | Wt 183.0 lb

## 2023-10-09 DIAGNOSIS — R600 Localized edema: Secondary | ICD-10-CM

## 2023-10-09 DIAGNOSIS — T50905A Adverse effect of unspecified drugs, medicaments and biological substances, initial encounter: Secondary | ICD-10-CM | POA: Diagnosis not present

## 2023-10-09 DIAGNOSIS — R220 Localized swelling, mass and lump, head: Secondary | ICD-10-CM | POA: Diagnosis not present

## 2023-10-09 DIAGNOSIS — I10 Essential (primary) hypertension: Secondary | ICD-10-CM | POA: Diagnosis not present

## 2023-10-09 DIAGNOSIS — I952 Hypotension due to drugs: Secondary | ICD-10-CM | POA: Diagnosis not present

## 2023-10-09 NOTE — Telephone Encounter (Signed)
   FYI Only or Action Required?: FYI only for provider.  Patient was last seen in primary care on 09/13/2023 by McGowen, Aleene DEL, MD. Called Nurse Triage reporting Facial Swelling, Foot Swelling, and urinary symptoms. Symptoms began a week ago. Interventions attempted: Rest, hydration, or home remedies. Symptoms are: unchanged.  Triage Disposition: See PCP When Office is Open (Within 3 Days)  Patient/caregiver understands and will follow disposition?: Yes  Copied from CRM [id number]. Topic: Clinical - Red Word Triage >> Oct 09, 2023  9:17 AM Thersia BROCKS wrote: [facility] that prompted transfer to Nurse Triage: feet and face is swolling, having alil trouble breathing, patient also stated that urine has been cloudy but not burning Reason for Disposition  [1] Mild face swelling (puffiness) AND [2] persists > 3 days  Answer Assessment - Initial Assessment Questions 1. ONSET: When did the swelling start? (e.g., minutes, hours, days)     Last Friday  2. LOCATION: What part of the face is swollen?     Puffy all over  3. SEVERITY: How swollen is it?     Mild 4. ITCHING: Is there any itching? If Yes, ask: How much?   (Scale 1-10; mild, moderate or severe)     No 5. PAIN: Is the swelling painful to touch? If Yes, ask: How painful is it?   (Scale 1-10; mild, moderate or severe)   - NONE (0): no pain   - MILD (1-3): doesn't interfere with normal activities    - MODERATE (4-7): interferes with normal activities or awakens from sleep    - SEVERE (8-10): excruciating pain, unable to do any normal activities      No 6. FEVER: Do you have a fever? If Yes, ask: What is it, how was it measured, and when did it start?      No  7. CAUSE: What do you think is causing the face swelling?     Unsure, maybe kidneys 8. RECURRENT SYMPTOM: Have you had face swelling before? If Yes, ask: When was the last time? What happened that time?     no 9. OTHER SYMPTOMS: Do you have any other  symptoms? (e.g., toothache, leg swelling)     Feet swelling, cloudy urine  Protocols used: Face Swelling-A-AH

## 2023-10-09 NOTE — Telephone Encounter (Signed)
FYI- pt scheduled.

## 2023-10-09 NOTE — Progress Notes (Signed)
 OFFICE VISIT  10/09/2023  CC:  Chief Complaint  Patient presents with   Facial Swelling    Started last week   Foot Swelling    Pain and pressure    Patient is a 73 y.o. female who presents for feet and facial swelling. A/P as of last visit on 6/11 (virtual visit): #1 hypertension. Increase amlodipine  to 7.5 mg a day.  If blood pressure not in the 130s over 80s consistently in 3 days then increase to 10 mg.  Keep taking Toprol -XL 200 mg a day and HCTZ 25 mg a day and losartan  50 mg twice a day.   #2 bipolar affective disorder with GAD.  Stable.  She would benefit from being able to leave the house, though.  She will be getting her car back from her daughter this weekend most likely. Continue citalopram  40 mg a day, Wellbutrin  XL 300 mg a day, and Lamictal  300 mg a day.  Also continue alprazolam  0.5 mg, 1-2 3 times daily as needed.   3.  Chronic right hip pain. She is getting home health PT but wants to switch back to outpatient PT at [facility]. Order placed today.  HPI: Over the last 10 days or so she has noticed gradually worsening swelling in her face and lower legs/feet. Has been taking 15 mg of amlodipine  instead of 7.5. Blood pressures have been low lately.  She feels more tired/easy fatigability lately. No chest pain, fever, cough, or wheeze.  She does note increased breathlessness with walking more than 10-15 steps.  No presyncope.  No swelling of the lips, tongue's, throat, or eyes. Her weight is up approximately 10 pounds from 08/01/2023--> her last in person visit here.  ROS as above, plus-->  no HAs, no rashes, no melena/hematochezia.  No polyuria or polydipsia.  No myalgias or arthralgias.  No focal weakness, paresthesias, or tremors.  No acute vision or hearing abnormalities.  No dysuria or unusual/new urinary urgency or frequency.  No n/v/d or abd pain.  No palpitations.     Past Medical History:  Diagnosis Date   Acute encephalopathy 11/19/2017   Alcoholism  in remission [facility])    states last alcohol 11 yrs ago   Anemia    Anxiety    Bipolar 1 disorder (HCC)    Admission for mania x 2 (most recent 11/2017)   Cervical spondylosis without myelopathy 12/19/2013   MRI 02/2014: multilevel DDD/spondylosis, not much change compared to prior MRI.  Pt not in favor of invasive therapy for her neck as of 04/2014.   Chronic constipation    Chronic renal insufficiency, stage 3 (moderate) (HCC)    COPD (chronic obstructive pulmonary disease) (HCC)    Moderate: anoro started 04/2017 by pulm, pt symptomatically improved and PFTs stable at f/u 06/2017.   Coronary artery spasm (HCC) 07/11/2018   nitrates=HA. Amlodipine =intol swelling.  Changed to coreg  09/03/18 by cardiologist.   Depression    Endometritis 05/2017   possible; empiric tx with Flagyl  by GYN.   Femoral neck fracture (HCC)    right, 01/28/23-->ORIF   Fibromyalgia    GERD (gastroesophageal reflux disease)    Hepatic steatosis 03/2019   u/s abd   Herpes zoster 08/07/2017   L scapular region   Hiatal hernia    History of adenomatous polyp of colon 04/2008; 08/2014   No high grade dysplasia: recall 5 yrs (Dr. Dianna, [facility])   History of basal cell carcinoma excision    NOSE   History of  Helicobacter pylori infection 04/2008   +gastric biopsy (gastritis but no metaplasia, dysplasia, or malignancy identified)   History of kidney stones    History of TIA (transient ischemic attack)    secondary to HELLP syndrome 1994 post c/s--  residual memory loss   Hyperlipidemia    statin started after her NSTEMI: goal  LDL <70.   Hypertension    Ischemic cardiomyopathy 05/2018   Echo EF 45-50% in context of NSTEMI from CA spasm. 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  Magdalen 04/2017.   Memory loss    Abnl MRI brain and CT brain c/w chronic microvascular ischemia.  Repeat MRI brain 02/2014 stable (Dr. Smitty with no plan to f/u with neuro as of 04/2014)   NSTEMI (non-ST elevated myocardial infarction) (HCC) 05/2018   Echo EF 45-50% in context of NSTEMI from CA spasm.// Myoview  8/21: EF 66, no ischemia, low risk   Osteoarthritis of left wrist    STT and 1st [facility] jt   Osteoporosis 05/2010   2012 'penia; 06/2015 'porosis:  prolia .  DEXA 09/2017 T-score -2.2 femur neck. 11/2021 T score -2.2. Rpt 2 yrs   Pelvic floor dysfunction    [facility] (Dr. Cam)   Postmenopausal vaginal bleeding 05/2017   x 1 small episode; GYN attempted endo bx but unable to penetrate cervix due to severe cerv stenosis (due to postmenopausal state + hx of LEEP).  Endo u/s showed thin endo lining.  Per GYN---no evidence of endomet pathology on w/u---obs as of 07/2017.   Prediabetes 06/2016   Fasting gluc 105; HbA1c at that time was 6.0%.  A1c 6.2% 12/2017.  A1c 5.7% Feb 2023.   Recurrent kidney stones 08/2013   Left ureteral calculus: Perc nephr--cystoscopy w/ureteroscopy + laser for stone removal.  Residual asymptomatic left renal nephrolithiasis <21mm present post-procedure.  Right sided hydronephrosis-persistent (on u/s)--urol ordered CT to further eval 08/2016: 1.6 cm nonobstructing stone lower pole left kidney, no hydro, plan for PCN extraction ([facility])   Sepsis (HCC)    UTI   Suprapubic discomfort 06/05/2015   TMJ (temporomandibular joint disorder)    USES  MOUTH GUARD   Toe fracture, left 12/2017   2nd toe prox phalanx (immobilization, Dr. Elner)   Vitamin D deficiency 05/2011    Past Surgical History:  Procedure Laterality Date   ABIs  11/29/2019   NORMAL   ANTERIOR CERVICAL DECOMP/DISCECTOMY FUSION  06/30/2009   C3 -- C5  AND EXPLORATION OF FUSION C5-7 W/  PLATE REMOVAL   ARTHRODESIS METATARSALPHALANGEAL JOINT (MTPJ) Left 07/08/2021   Procedure: Left hallux  metatarsophalangeal joint arthrodesis;  Surgeon: Kit Rush, MD;  Location: [facility];  Service: Orthopedics;  Laterality: Left;   BACK SURGERY     BUNIONECTOMY Left    CARDIOVASCULAR STRESS TEST  12/03/2019   myoc perf im: NORMAL   CERVICAL FUSION  2002   anterior C5 -- C7   CERVICAL SPINE SURGERY  08/27/1999     C6 -- T1  LAMINECTOMY/  DISKECTOMY   CESAREAN SECTION  1994   CHOLECYSTECTOMY N/A 03/06/2020   Procedure: LAPAROSCOPIC CHOLECYSTECTOMY;  Surgeon: Lyndel Deward PARAS, MD;  Location: [facility] OR;  Service: General;  Laterality: N/A;   COLONOSCOPY W/ POLYPECTOMY  04/2008;08/2014   2016 tubular adenoma x 1; +diverticulosis and  int/ext hemorrhoids.  06/2020 adenoma, recall 5 yrs.   CYSTOSCOPY WITH URETEROSCOPY AND STENT PLACEMENT Left 08/28/2013   Procedure: CYSTOSCOPY, RGP,  WITH URETEROSCOPY AND STENT REMOVAL;  Surgeon: Alm GORMAN Fragmin, MD;  Location[facility];  Service: Urology;  Laterality: Left;   DEXA  09/2017   T-score -2.2 femur neck: improved compared to 06/2015.  2021 DEXA T score -2.3.  11/2021 DEXA T score -2.2.   ESOPHAGOGASTRODUODENOSCOPY  2010; 08/2014   2016 +Candidal esophagitis; Mild chronic gastritis w/intestinal metaplasia--NEG H pylori, neg for eosinophilic esoph. 06/2020 esoph candidiasis (diflucan  x 20d), o/w normal.   HAMMER TOE SURGERY Left 07/08/2021   Procedure: Second metatarsal head excision; revision Second hammertoe correction; 2-5 percutaneus flexor tenotomies;  Surgeon: Kit Rush, MD;  Location: [facility];  Service: Orthopedics;  Laterality: Left;   HARDWARE REMOVAL Left 07/08/2021   Procedure: Removal of deep implants from hallux proximal phalanx and First and Second metatarsals;  Surgeon: Kit Rush, MD;  Location: [facility];  Service: Orthopedics;  Laterality: Left;   HIP PINNING,CANNULATED Right 01/28/2023   Procedure: PERCUTANEOUS FIXATION OF RIGHT FEMORAL NECK;  Surgeon: Sharl Selinda Dover, MD;  Location: [facility] OR;  Service: Orthopedics;  Laterality: Right;   HOLMIUM LASER APPLICATION Left 08/28/2013   Procedure: HOLMIUM LASER APPLICATION;  Surgeon: Alm GORMAN Fragmin, MD;  Location[facility];  Service: Urology;  Laterality: Left;   LEFT HEART CATH AND CORONARY ANGIOGRAPHY N/A 05/15/2018   Evidence for spasm in distal LAD.  Mod RCA atherosclerosis, o/w no CAD.  Procedure: LEFT HEART CATH AND CORONARY ANGIOGRAPHY;  Surgeon: Anner Alm ORN, MD;  Location: [facility] INVASIVE CV LAB;  Service: Cardiovascular;  Laterality: N/A;   NEGATIVE SLEEP STUDY  04/01/2007   NEPHROLITHOTOMY Left 08/05/2013   Procedure: NEPHROLITHOTOMY PERCUTANEOUS;  Surgeon: Alm GORMAN Fragmin, MD;  Location: [facility];  Service: Urology;  Laterality: Left;   POLYSOMNOGRAM  01/2019   NORMAL   POLYSOMNOGRAPHY  10/25/2018   Dr. Wilbert Passey due to pt inadequat sleep time (83 min).   TONSILLECTOMY AND ADENOIDECTOMY  1975   TOTAL KNEE ARTHROPLASTY Right 2003   TOTAL KNEE REVISION Right 01/19/2022   Procedure: Right knee polyethylene revision;  Surgeon: Melodi Lerner, MD;  Location: [facility];  Service: Orthopedics;  Laterality: Right;   TRANSTHORACIC ECHOCARDIOGRAM  05/2018; 12/03/2019   (after MI secondary to arterial spasm): EF 45-50%; severe hypokinesis of apical anterolateral, inferolateral , anterior, and inferior LV myocardium. 11/2019 EF 55-60%, valves fine   ULTRASOUND EXAM,PELVIC COMPLETE ([facility] HX)  06/25/2015   NORMAL (done by GYN)    Outpatient Medications Prior to Visit  Medication Sig Dispense Refill   albuterol  (VENTOLIN  HFA) 108 (90 Base) MCG/ACT inhaler TAKE 2 PUFFS BY MOUTH EVERY 6 HOURS AS NEEDED FOR WHEEZE OR SHORTNESS OF BREATH 18 each 0   ALPRAZolam  (XANAX ) 0.5 MG tablet TAKE 1-2 TABLETS BY MOUTH THREE TIMES A DAY AS NEEDED FOR ANXIETY 180 tablet 5   amLODipine  (NORVASC ) 5 MG tablet Take 1.5 tablets (7.5 mg total) by mouth daily. 30 tablet 0   atorvastatin  (LIPITOR ) 80 MG  tablet TAKE 1 TABLET BY MOUTH EVERY DAY 90 tablet 1   buPROPion  (WELLBUTRIN  XL) 300 MG 24 hr tablet TAKE 1 TABLET BY MOUTH EVERY DAY 90 tablet 1   citalopram  (CELEXA ) 40 MG tablet TAKE 1 TABLET BY MOUTH EVERY DAY 30 tablet 3   famotidine  (PEPCID ) 40 MG tablet TAKE 1 TABLET BY MOUTH EVERY DAY  90 tablet 1   fluticasone  (FLONASE ) 50 MCG/ACT nasal spray SPRAY 2 SPRAYS INTO EACH NOSTRIL EVERY DAY 48 mL 1   hydrochlorothiazide  (HYDRODIURIL ) 12.5 MG tablet 2 tabs daily. 60 tablet 0   HYDROcodone -acetaminophen  (NORCO/VICODIN) 5-325 MG tablet 1-2 tab po bid as needed for pain 90 tablet 0   lamoTRIgine  (LAMICTAL ) 100 MG tablet TAKE 3 TABLETS (300 MG TOTAL) BY MOUTH AT BEDTIME 270 tablet 2   loratadine  (CLARITIN ) 10 MG tablet Take 10 mg by mouth daily.     losartan  (COZAAR ) 25 MG tablet 1 tab po bid 180 tablet 3   meloxicam  (MOBIC ) 15 MG tablet TAKE 1 TABLET BY MOUTH ONCE DAILY AS NEEDED FOR JOINT PAIN 30 tablet 2   metoprolol  succinate (TOPROL -XL) 100 MG 24 hr tablet 1 and 1/2 tabs p.o. daily.  Take with or immediately following a meal. 135 tablet 1   montelukast  (SINGULAIR ) 10 MG tablet TAKE 1 TABLET BY MOUTH EVERY DAY IN THE EVENING 90 tablet 1   nitroGLYCERIN  (NITROSTAT ) 0.4 MG SL tablet PLACE 1 TABLET (0.4 MG TOTAL) UNDER THE TONGUE EVERY 5 (FIVE) MINUTES X 3 DOSES AS NEEDED FOR CHEST PAIN. 25 tablet 1   ondansetron  (ZOFRAN -ODT) 4 MG disintegrating tablet Take 1 tablet (4 mg total) by mouth every 8 (eight) hours as needed. 8 tablet 0   pantoprazole  (PROTONIX ) 40 MG tablet TAKE 1 TABLET (40 MG TOTAL) BY MOUTH DAILY. KEEP F/U APPT FOR ANY FURTHER REFILLS 90 tablet 0   potassium chloride  SA (KLOR-CON  M) 20 MEQ tablet TAKE 1 TABLET BY MOUTH EVERY DAY 90 tablet 1   Tiotropium Bromide -Olodaterol (STIOLTO RESPIMAT ) 2.5-2.5 MCG/ACT AERS Inhale 2 each into the lungs daily. 4 g 5   lactulose  (CHRONULAC ) 10 GM/15ML solution Take 30 mLs (20 g total) by mouth daily. (Patient not taking: Reported on 09/01/2023) 236 mL  3   No facility-administered medications prior to visit.    Allergies  Allergen Reactions   Ceftin [Cefuroxime Axetil] Anaphylaxis and Swelling    Swelling of eyes and tongue   Cipro  [Ciprofloxacin  Hcl] Anaphylaxis and Swelling   Risperdal  [Risperidone ] Palpitations    Generalized weakness Malaise  Hypertension   Fosamax  [Alendronate  Sodium] Diarrhea   Linzess [Linaclotide] Diarrhea   Morphine  And Codeine Other (See Comments)    Hallucinations Disorientation    Movantik  [Naloxegol ]     N/v   Prednisone  Other (See Comments)    Hyperactivity   Roxicodone  [Oxycodone ] Other (See Comments)    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     Review of Systems  As per HPI  PE:    10/09/2023    4:00 PM 09/01/2023    2:56 PM 09/01/2023    2:54 PM  Vitals with BMI  Weight 183 lbs    Systolic 104 138 844  Diastolic 64 80 87  Pulse 67 77      Physical Exam  Gen: Alert, well appearing.  Patient is oriented to person, place, time, and situation. AFFECT: pleasant, lucid thought and speech. Face with some fullness diffusely, without angioedematous swelling of the lips,, tongue, pharynx, or eyelids. No rash. Lower extremities with 3+ pitting edema from mid tibia level down into the feet bilaterally. Skin: No hives. CV: RRR, no m/r/g.   LUNGS: CTA bilat, nonlabored resps, good aeration in all lung fields.  LABS:  Last CBC Lab Results  Component Value Date   WBC 9.6 07/25/2023   HGB 10.2 (L) 07/25/2023  HCT 30.6 (L) 07/25/2023   MCV 93.0 07/25/2023   MCH 31.0 07/25/2023   RDW 13.1 07/25/2023   PLT 280 07/25/2023   Lab Results  Component Value Date   IRON 64 02/10/2023   TIBC 256 02/10/2023   FERRITIN 234 02/10/2023   Last metabolic panel Lab Results  Component Value Date   GLUCOSE 111 (H) 07/25/2023   NA 135 07/25/2023   K 3.7 07/25/2023   CL 100 07/25/2023   CO2 26 07/25/2023   BUN 18 07/25/2023    CREATININE 0.91 07/25/2023   GFRNONAA >60 07/25/2023   CALCIUM  9.0 07/25/2023   PROT 6.5 07/25/2023   ALBUMIN  3.8 07/25/2023   BILITOT 0.9 07/25/2023   ALKPHOS 63 07/25/2023   AST 13 (L) 07/25/2023   ALT 19 07/25/2023   ANIONGAP 9 07/25/2023   Last thyroid  functions Lab Results  Component Value Date   TSH 0.91 10/12/2022   Lab Results  Component Value Date   HGBA1C 5.5 01/06/2022   IMPRESSION AND PLAN:  #1 facial and lower extremity edema.  Suspect this is side effect from her amlodipine  (she is taking 15 mg a day). Stop amlodipine . Okay to continue all other medications as is. Check complete metabolic panel today. Check blood pressure and heart rate once a day and we will recheck her in 1 week.  #2 hypotension. See #1 above.  An After Visit Summary was printed and given to the patient.  FOLLOW UP: Return in about 1 week (around 10/16/2023) for f/u swelling and bp.  Signed:  Gerlene Hockey, MD           10/09/2023

## 2023-10-09 NOTE — Patient Instructions (Signed)
 Stop your amlodipine .  Continue all other medication as is.  Check blood pressure and heart rate once a day.

## 2023-10-10 ENCOUNTER — Ambulatory Visit: Payer: Self-pay | Admitting: Family Medicine

## 2023-10-10 LAB — COMPREHENSIVE METABOLIC PANEL WITH GFR
AG Ratio: 2.5 (calc) (ref 1.0–2.5)
ALT: 14 U/L (ref 6–29)
AST: 14 U/L (ref 10–35)
Albumin: 4.5 g/dL (ref 3.6–5.1)
Alkaline phosphatase (APISO): 68 U/L (ref 37–153)
BUN/Creatinine Ratio: 21 (calc) (ref 6–22)
BUN: 25 mg/dL (ref 7–25)
CO2: 27 mmol/L (ref 20–32)
Calcium: 9.3 mg/dL (ref 8.6–10.4)
Chloride: 99 mmol/L (ref 98–110)
Creat: 1.19 mg/dL — ABNORMAL HIGH (ref 0.60–1.00)
Globulin: 1.8 g/dL — ABNORMAL LOW (ref 1.9–3.7)
Glucose, Bld: 105 mg/dL — ABNORMAL HIGH (ref 65–99)
Potassium: 4 mmol/L (ref 3.5–5.3)
Sodium: 135 mmol/L (ref 135–146)
Total Bilirubin: 0.4 mg/dL (ref 0.2–1.2)
Total Protein: 6.3 g/dL (ref 6.1–8.1)
eGFR: 49 mL/min/1.73m2 — ABNORMAL LOW (ref >=60)

## 2023-10-11 ENCOUNTER — Other Ambulatory Visit: Payer: Self-pay | Admitting: Family Medicine

## 2023-10-12 ENCOUNTER — Other Ambulatory Visit: Payer: Self-pay | Admitting: Family Medicine

## 2023-10-13 ENCOUNTER — Encounter: Payer: Self-pay | Admitting: Physical Therapy

## 2023-10-13 ENCOUNTER — Ambulatory Visit: Admitting: Physical Therapy

## 2023-10-13 DIAGNOSIS — M25551 Pain in right hip: Secondary | ICD-10-CM

## 2023-10-13 DIAGNOSIS — R2689 Other abnormalities of gait and mobility: Secondary | ICD-10-CM | POA: Diagnosis not present

## 2023-10-13 NOTE — Therapy (Unsigned)
 OUTPATIENT PHYSICAL THERAPY LOWER EXTREMITY EVALUATION   Patient Name: Erin Lopez MRN: [id number] DOB:02-25-1951, 73 y.o., female Today's Date: 10/13/2023  END OF SESSION:  PT End of Session - 10/13/23 1332     Visit Number 1    Number of Visits 16    Date for PT Re-Evaluation 12/08/23    Authorization Type [employer]    PT Start Time 1145    PT Stop Time 1229    PT Time Calculation (min) 44 min    Activity Tolerance Patient tolerated treatment well    Behavior During Therapy [facility] for tasks assessed/performed          Past Medical History:  Diagnosis Date   Acute encephalopathy 11/19/2017   Alcoholism in remission (HCC)    states last alcohol 11 yrs ago   Anemia    Anxiety    Bipolar 1 disorder (HCC)    Admission for mania x 2 (most recent 11/2017)   Cervical spondylosis without myelopathy 12/19/2013   MRI 02/2014: multilevel DDD/spondylosis, not much change compared to prior MRI.  Pt not in favor of invasive therapy for her neck as of 04/2014.   Chronic constipation    Chronic renal insufficiency, stage 3 (moderate) (HCC)    COPD (chronic obstructive pulmonary disease) (HCC)    Moderate: anoro started 04/2017 by pulm, pt symptomatically improved and PFTs stable at f/u 06/2017.   Coronary artery spasm (HCC) 07/11/2018   nitrates=HA. Amlodipine =intol swelling.  Changed to coreg  09/03/18 by cardiologist.   Depression    Endometritis 05/2017   possible; empiric tx with Flagyl  by GYN.   Femoral neck fracture (HCC)    right, 01/28/23-->ORIF   Fibromyalgia    GERD (gastroesophageal reflux disease)    Hepatic steatosis 03/2019   u/s abd   Herpes zoster 08/07/2017   L scapular region   Hiatal hernia    History of adenomatous polyp of colon 04/2008; 08/2014   No high grade dysplasia: recall 5 yrs (Dr. Dianna, [facility])   History of basal cell carcinoma excision    NOSE   History of Helicobacter pylori infection 04/2008   +gastric biopsy (gastritis but no  metaplasia, dysplasia, or malignancy identified)   History of kidney stones    History of TIA (transient ischemic attack)    secondary to HELLP syndrome 1994 post c/s--  residual memory loss   Hyperlipidemia    statin started after her NSTEMI: goal  LDL <70.   Hypertension    Ischemic cardiomyopathy 05/2018   Echo EF 45-50% in context of NSTEMI from CA spasm. 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 Magdalen 04/2017.   Memory loss    Abnl MRI brain and CT brain c/w chronic microvascular ischemia.  Repeat MRI brain 02/2014 stable (Dr. Smitty with no plan to f/u with neuro as of 04/2014)   NSTEMI (non-ST elevated myocardial infarction) (HCC) 05/2018   Echo EF 45-50% in context of NSTEMI from CA spasm.// Myoview  8/21: EF 66, no ischemia, low risk   Osteoarthritis of left wrist    STT and [facility] jt   Osteoporosis 05/2010   2012 'penia; 06/2015 'porosis:  prolia .  DEXA 09/2017 T-score -2.2 femur neck. 11/2021 T score -2.2. Rpt 2 yrs   Pelvic floor dysfunction  [facility] (Dr. Cam)   Postmenopausal vaginal bleeding 05/2017   x 1 small episode; GYN attempted endo bx but unable to penetrate cervix due to severe cerv stenosis (due to postmenopausal state + hx of LEEP).  Endo u/s showed thin endo lining.  Per GYN---no evidence of endomet pathology on w/u---obs as of 07/2017.   Prediabetes 06/2016   Fasting gluc 105; HbA1c at that time was 6.0%.  A1c 6.2% 12/2017.  A1c 5.7% Feb 2023.   Recurrent kidney stones 08/2013   Left ureteral calculus: Perc nephr--cystoscopy w/ureteroscopy + laser for stone removal.  Residual asymptomatic left renal nephrolithiasis <46mm present post-procedure.  Right sided hydronephrosis-persistent (on u/s)--urol ordered CT to  further eval 08/2016: 1.6 cm nonobstructing stone lower pole left kidney, no hydro, plan for PCN extraction ([facility])   Sepsis (HCC)    UTI   Suprapubic discomfort 06/05/2015   TMJ (temporomandibular joint disorder)    USES  MOUTH GUARD   Toe fracture, left 12/2017   2nd toe prox phalanx (immobilization, Dr. Elner)   Vitamin D deficiency 05/2011   Past Surgical History:  Procedure Laterality Date   ABIs  11/29/2019   NORMAL   ANTERIOR CERVICAL DECOMP/DISCECTOMY FUSION  06/30/2009   C3 -- C5  AND EXPLORATION OF FUSION C5-7 W/  PLATE REMOVAL   ARTHRODESIS METATARSALPHALANGEAL JOINT (MTPJ) Left 07/08/2021   Procedure: Left hallux metatarsophalangeal joint arthrodesis;  Surgeon: Kit Rush, MD;  Location: [facility];  Service: Orthopedics;  Laterality: Left;   BACK SURGERY     BUNIONECTOMY Left    CARDIOVASCULAR STRESS TEST  12/03/2019   myoc perf im: NORMAL   CERVICAL FUSION  2002   anterior C5 -- C7   CERVICAL SPINE SURGERY  08/27/1999     C6 -- T1  LAMINECTOMY/  DISKECTOMY   CESAREAN SECTION  1994   CHOLECYSTECTOMY N/A 03/06/2020   Procedure: LAPAROSCOPIC CHOLECYSTECTOMY;  Surgeon: Lyndel Deward PARAS, MD;  Location: [facility];  Service: General;  Laterality: N/A;   COLONOSCOPY W/ POLYPECTOMY  04/2008;08/2014   2016 tubular adenoma x 1; +diverticulosis and int/ext hemorrhoids.  06/2020 adenoma, recall 5 yrs.   CYSTOSCOPY WITH URETEROSCOPY AND STENT PLACEMENT Left 08/28/2013   Procedure: CYSTOSCOPY, RGP,  WITH URETEROSCOPY AND STENT REMOVAL;  Surgeon: Alm GORMAN Fragmin, MD;  Location: [place];  Service: Urology;  Laterality: Left;   DEXA  09/2017   T-score -2.2 femur neck: improved compared to 06/2015.  2021 DEXA T score -2.3.  11/2021 DEXA T score -2.2.   ESOPHAGOGASTRODUODENOSCOPY  2010; 08/2014   2016 +Candidal esophagitis; Mild chronic gastritis w/intestinal metaplasia--NEG H pylori, neg for eosinophilic esoph. 06/2020 esoph candidiasis (diflucan  x 20d), o/w  normal.   HAMMER TOE SURGERY Left 07/08/2021   Procedure: Second metatarsal head excision; revision Second hammertoe correction; 2-5 percutaneus flexor tenotomies;  Surgeon: Kit Rush, MD;  Location: [facility];  Service: Orthopedics;  Laterality: Left;   HARDWARE REMOVAL Left 07/08/2021   Procedure: Removal of deep implants from hallux proximal phalanx and First and Second metatarsals;  Surgeon: Kit Rush, MD;  Location: [facility];  Service: Orthopedics;  Laterality: Left;   HIP PINNING,CANNULATED Right 01/28/2023   Procedure: PERCUTANEOUS FIXATION OF RIGHT FEMORAL NECK;  Surgeon: Sharl Selinda Dover, MD;  Location: [facility] OR;  Service: Orthopedics;  Laterality: Right;   HOLMIUM LASER APPLICATION Left 08/28/2013   Procedure: HOLMIUM LASER APPLICATION;  Surgeon: Alm GORMAN Fragmin, MD;  Location: [facility];  Service:  Urology;  Laterality: Left;   LEFT HEART CATH AND CORONARY ANGIOGRAPHY N/A 05/15/2018   Evidence for spasm in distal LAD.  Mod RCA atherosclerosis, o/w no CAD.  Procedure: LEFT HEART CATH AND CORONARY ANGIOGRAPHY;  Surgeon: Anner Alm ORN, MD;  Location: [facility] IN[facility]ASIVE [facility];  Service: Cardiovascular;  Laterality: N/A;   NEGATIVE SLEEP STUDY  04/01/2007   NEPHROLITHOTOMY Left 08/05/2013   Procedure: NEPHROLITHOTOMY PERCUTANEOUS;  Surgeon: Alm GORMAN Fragmin, MD;  Location: [facility];  Service: Urology;  Laterality: Left;   POLYSOMNOGRAM  01/2019   NORMAL   POLYSOMNOGRAPHY  10/25/2018   Dr. Wilbert Passey due to pt inadequat sleep time (83 min).   TONSILLECTOMY AND ADENOIDECTOMY  1975   TOTAL KNEE ARTHROPLASTY Right 2003   TOTAL KNEE REVISION Right 01/19/2022   Procedure: Right knee polyethylene revision;  Surgeon: Melodi Lerner, MD;  Location: [facility];  Service: Orthopedics;  Laterality: Right;   TRANSTHORACIC ECHOCARDIOGRAM  05/2018; 12/03/2019   (after MI secondary to arterial spasm): EF 45-50%; severe hypokinesis of apical  anterolateral, inferolateral , anterior, and inferior LV myocardium. 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   Healthcare maintenance 04/19/2019   Overweight (BMI 25.0-29.9) 02/19/2019   Ischemic cardiomyopathy 09/03/2018   Coronary vasospasm (HCC) 07/11/2018   NSTEMI (non-ST elevated myocardial infarction) (HCC) 05/14/2018   Hyponatremia 11/16/2017   Bipolar 1 disorder (HCC) 12/29/2016   Hypertension 12/29/2016   History of TIA (transient ischemic attack) 12/29/2016   Chronic pelvic pain in female 08/22/2016   Stage 2 chronic kidney disease 08/22/2016   Osteoporosis 09/30/2015   Health maintenance examination 06/11/2015   Urethral caruncle 06/05/2015   UTI (urinary tract infection) 06/05/2015   TMJ (temporomandibular joint disorder) 02/25/2015   Right ankle injury 11/05/2014   Cervical spondylosis without myelopathy 12/19/2013   Sprain of neck 12/19/2013   Memory difficulties 12/19/2013   Postnasal drip 10/28/2013   Constipation, chronic 07/29/2013   COPD (chronic obstructive pulmonary disease) (HCC) 06/30/2013   Kidney stone on left side 05/29/2013   Hyperlipidemia 05/29/2013     PCP: Phiip McGowen  REFERRING PROVIDER: Phiip McGowen  REFERRING DIAG: R hip pain   THERAPY DIAG:  Pain in right hip  Other abnormalities of gait and mobility  Rationale for Evaluation and Treatment: Rehabilitation  ONSET DATE:  01/28/23   SUBJECTIVE:   SUBJECTIVE STATEMENT: Pt had fall, suffering R femur fracture on 01/28/23 last year.  She had fixation of femoral neck by Dr. Sharl. States difficulty in  hospital and recovering from this, had sepsis, etc. She had difficulty with mobility after this, did not have much PT, had some home heath. States R hip still sore, notes pain in lateral hip with activity and walking.  She also had another fall in April at home, fell hard, broke nose, hurt face, concussion and broke R wrist. Still wearing brace on R hand, but reports wrist doing well at this time.  She notes difficulty with BP lately, being high and low. States difficulty with motivation lately, and feels like she has no energy- MD aware.  Reports sleeping well at night.  Lives alone, does have life alert and does drive.  States she feels something moving in hip when she turns her leg a certain direction ( S/L Hip er/ir)    PERTINENT HISTORY: CKD, HTN,  TIA,  COPD, Osteoporosis, Bipolar,  R TKA and revision  PAIN:  Are you having pain? Yes: NPRS scale: 4/10 Pain location: R lateral hip Pain description: sore Aggravating factors: unable to state  Relieving factors: unable to state   PRECAUTIONS: Fall  WEIGHT BEARING RESTRICTIONS: No  FALLS:  Has patient fallen in last 6 months? Yes. Number of falls 2 - broke R wrist/outside got out of car,     PLOF: Independent   PATIENT GOALS:  Decreased pain in hip, increased activity level.   NEXT MD VISIT:   OBJECTIVE:   DIAGNOSTIC FINDINGS:   PATIENT SURVEYS:    COGNITION: Overall cognitive status: Within functional limits for tasks assessed     SENSATION: WFL  EDEMA: n/a   POSTURE:    No Significant postural limitations  PALPATION: Tenderness at lateral R hip, and gr trochanter   LOWER EXTREMITY ROM: Hip flex, IR, ER- mild limitations Knees: WFL   LOWER EXTREMITY MMT:  MMT Left eval Right  eval  Hip flexion 4+ 4-  Hip extension    Hip abduction  3/pain  Hip adduction    Hip internal rotation    Hip external rotation 4+ 4  Knee flexion 5 4+  Knee extension 5 4  Ankle dorsiflexion    Ankle plantarflexion     Ankle inversion    Ankle eversion     (Blank rows = not tested)  LOWER EXTREMITY SPECIAL TESTS:    FUNCTIONAL TESTS:  Will test balance in future sessions- pt with decreased balance/stability, not using AD at this time.   GAIT: Distance walked: 150 Assistive device utilized: None Level of assistance: Complete Independence Comments: R valgus knee, decreased knee extension and heel strike on R.    TODAY'S TREATMENT:                                                                                                                              DATE:   10/13/2023 Ther ex:  Supine: SLR 2 x 10 on R;  Hip clams GTB x 15;(sore by end) ;  LAQ x 15 on L;    PATIENT EDUCATION:  Education details: PT POC, Exam findings, HEP Person educated: Patient Education method: Explanation, Demonstration, Tactile cues, Verbal cues, and Handouts Education comprehension: verbalized understanding, returned demonstration, verbal cues required, tactile cues required, and needs further education   HOME EXERCISE PROGRAM: Access Code: [email or web address] URL: [email or web address] Date: 10/13/2023 Prepared by: Tinnie Don  Exercises - Straight Leg Raise  - 1 x daily - 2 sets - 5- 10 reps - Seated Knee Extension AROM  - 2 x daily - 2 sets - 10 reps  ASSESSMENT:  CLINICAL IMPRESSION: Patient presents with primary complaint of  pain in R  hip. She had previous fracture with surgery last year, and continues to have pain and deficit since. She has weakness in R hip and increased pain with movement. She is overall deconditioned and would benefit from increasing mobility. She also has difficulty with balance and has had a few falls with injury. She will benefit from further evaluation of balance at future visits, and treatment of this as needed, to prevent further falls. She has decreased gait mechanics, with valgus R knee and decreased heel strike/short stride on R.  Pt with decreased ability for full  functional activities. Pt will  benefit from skilled PT to improve deficits and pain and to return to PLOF.   OBJECTIVE IMPAIRMENTS: Abnormal gait, decreased activity tolerance, decreased coordination, decreased mobility, difficulty walking, decreased ROM, decreased strength, impaired flexibility, impaired vision/preception, improper body mechanics, and pain.   ACTIVITY LIMITATIONS: standing, squatting, stairs, transfers, and locomotion level  PARTICIPATION LIMITATIONS: meal prep, cleaning, laundry, driving, shopping, and community activity  PERSONAL FACTORS: Past/current experiences, Time since onset of injury/illness/exacerbation, and 1 comorbidity: surgery are also affecting patient's functional outcome.   REHAB POTENTIAL: Good  CLINICAL DECISION MAKING: Evolving/moderate complexity  EVALUATION COMPLEXITY: Moderate   GOALS: Goals reviewed with patient? Yes  SHORT TERM GOALS: Target date: 11/03/2023   Pt to be independent with initial HEP  Goal status: INITIAL  2.  Pt to demo R hip ROM to be pain free in all directions.   Goal status: INITIAL    LONG TERM GOALS: Target date: 12/08/2023   Pt to be independent with final HEP  Goal status: INITIAL  2.  Pt to report decreased pain in R hip to 0-3/10 with activity and walking   Goal status: INITIAL  3.  Pt to demo improved strength of R hip to be at least 4/5 , to improve stability, gait and pain   Goal status: INITIAL  4.  Balance goal TBD  Goal status: INITIAL    PLAN:  PT FREQUENCY: 1-2x/week  PT DURATION: 8 weeks  PLANNED INTERVENTIONS: Therapeutic exercises, Therapeutic activity, Neuromuscular re-education, Patient/Family education, Self Care, Joint mobilization, Joint manipulation, Stair training, Orthotic/Fit training, DME instructions, Aquatic Therapy, Dry Needling, Electrical stimulation, Cryotherapy, Moist heat, Taping, Ultrasound, Ionotophoresis 4mg /ml Dexamethasone , Manual therapy,  Vasopneumatic  device, Traction, Spinal manipulation, Spinal mobilization,Balance training, Gait training,   PLAN FOR NEXT SESSION:  hip strength, hip abd strength, Lateral hip pain,    Tinnie Don, PT, DPT 1:33 PM  10/13/23

## 2023-10-14 ENCOUNTER — Other Ambulatory Visit: Payer: Self-pay | Admitting: Family Medicine

## 2023-10-16 ENCOUNTER — Encounter: Payer: Self-pay | Admitting: Family Medicine

## 2023-10-16 ENCOUNTER — Ambulatory Visit (INDEPENDENT_AMBULATORY_CARE_PROVIDER_SITE_OTHER): Admitting: Family Medicine

## 2023-10-16 VITALS — BP 124/73 | HR 64 | Temp 97.6°F | Ht 68.0 in | Wt 180.2 lb

## 2023-10-16 DIAGNOSIS — N1831 Chronic kidney disease, stage 3a: Secondary | ICD-10-CM | POA: Diagnosis not present

## 2023-10-16 DIAGNOSIS — I10 Essential (primary) hypertension: Secondary | ICD-10-CM

## 2023-10-16 MED ORDER — HYDROCHLOROTHIAZIDE 25 MG PO TABS: 25.0000 mg | ORAL_TABLET | Freq: Every day | ORAL | 3 refills | Status: AC

## 2023-10-16 NOTE — Telephone Encounter (Signed)
 Pt has appt today

## 2023-10-16 NOTE — Patient Instructions (Signed)
   It was very nice to see you today!   PLEASE NOTE:  If labs were collected or images ordered, we will inform you of  results once we have received them and reviewed. We will contact you either by echart message, or telephone call.     If we ordered any referrals today, please let us know if you have not heard from their office within the next 2 weeks. You should receive a letter via MyChart confirming if the referral was approved and their office contact information to schedule.

## 2023-10-16 NOTE — Progress Notes (Signed)
 OFFICE VISIT  10/16/2023  CC:  Chief Complaint  Patient presents with   Follow-up    1 week f/u; advised not to take Amlodipine     Patient is a 73 y.o. female who presents for 1 week follow-up hypertension. A/P as of last visit: #1 facial and lower extremity edema.  Suspect this is side effect from her amlodipine  (she is taking 15 mg a day). Stop amlodipine . Genna to continue all other medications as is (Toprol -XL 200 mg a day and HCTZ 25 mg a day and losartan  50 mg twice a day). Check complete metabolic panel today. Check blood pressure and heart rate once a day and we will recheck her in 1 week.   #2 hypotension. See #1 above.  INTERIM HX: Metabolic panel stable last visit. Doing pretty well.  Her swelling in the face and legs has resolved. Blood pressure in the 130 to 140s range at home. No dizziness, chest pain, shortness of breath, or headaches.    Past Medical History:  Diagnosis Date   Acute encephalopathy 11/19/2017   Alcoholism in remission [facility])    states last alcohol 11 yrs ago   Anemia    Anxiety    Bipolar 1 disorder (HCC)    Admission for mania x 2 (most recent 11/2017)   Cervical spondylosis without myelopathy 12/19/2013   MRI 02/2014: multilevel DDD/spondylosis, not much change compared to prior MRI.  Pt not in favor of invasive therapy for her neck as of 04/2014.   Chronic constipation    Chronic renal insufficiency, stage 3 (moderate) (HCC)    COPD (chronic obstructive pulmonary disease) (HCC)    Moderate: anoro started 04/2017 by pulm, pt symptomatically improved and PFTs stable at f/u 06/2017.   Coronary artery spasm (HCC) 07/11/2018   nitrates=HA. Amlodipine =intol swelling.  Changed to coreg  09/03/18 by cardiologist.   Depression    Endometritis 05/2017   possible; empiric tx with Flagyl  by GYN.   Femoral neck fracture (HCC)    right, 01/28/23-->ORIF   Fibromyalgia    GERD (gastroesophageal reflux disease)    Hepatic steatosis 03/2019   u/s abd    Herpes zoster 08/07/2017   L scapular region   Hiatal hernia    History of adenomatous polyp of colon 04/2008; 08/2014   No high grade dysplasia: recall 5 yrs (Dr. Dianna, [facility])   History of basal cell carcinoma excision    NOSE   History of Helicobacter pylori infection 04/2008   +gastric biopsy (gastritis but no metaplasia, dysplasia, or malignancy identified)   History of kidney stones    History of TIA (transient ischemic attack)    secondary to HELLP syndrome 1994 post c/s--  residual memory loss   Hyperlipidemia    statin started after her NSTEMI: goal  LDL <70.   Hypertension    Ischemic cardiomyopathy 05/2018   Echo EF 45-50% in context of NSTEMI from CA spasm. 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 Magdalen 04/2017.   Memory loss    Abnl MRI brain and CT brain c/w chronic microvascular ischemia.  Repeat MRI brain 02/2014 stable (Dr. Smitty with no plan to f/u with neuro as of 04/2014)   NSTEMI (non-ST elevated myocardial infarction) (HCC) 05/2018   Echo EF 45-50% in context of NSTEMI from CA  spasm.// Myoview  8/21: EF 66, no ischemia, low risk   Osteoarthritis of left wrist    STT and 1st [facility] jt   Osteoporosis 05/2010   2012 'penia; 06/2015 'porosis:  prolia .  DEXA 09/2017 T-score -2.2 femur neck. 11/2021 T score -2.2. Rpt 2 yrs   Pelvic floor dysfunction    [facility] (Dr. Cam)   Postmenopausal vaginal bleeding 05/2017   x 1 small episode; GYN attempted endo bx but unable to penetrate cervix due to severe cerv stenosis (due to postmenopausal state + hx of LEEP).  Endo u/s showed thin endo lining.  Per GYN---no evidence of endomet pathology on w/u---obs as of 07/2017.   Prediabetes 06/2016   Fasting gluc 105; HbA1c at that time  was 6.0%.  A1c 6.2% 12/2017.  A1c 5.7% Feb 2023.   Recurrent kidney stones 08/2013   Left ureteral calculus: Perc nephr--cystoscopy w/ureteroscopy + laser for stone removal.  Residual asymptomatic left renal nephrolithiasis <1mm present post-procedure.  Right sided hydronephrosis-persistent (on u/s)--urol ordered CT to further eval 08/2016: 1.6 cm nonobstructing stone lower pole left kidney, no hydro, plan for PCN extraction ([facility])   Sepsis (HCC)    UTI   Suprapubic discomfort 06/05/2015   TMJ (temporomandibular joint disorder)    USES  MOUTH GUARD   Toe fracture, left 12/2017   2nd toe prox phalanx (immobilization, Dr. Elner)   Vitamin D deficiency 05/2011    Past Surgical History:  Procedure Laterality Date   ABIs  11/29/2019   NORMAL   ANTERIOR CERVICAL DECOMP/DISCECTOMY FUSION  06/30/2009   C3 -- C5  AND EXPLORATION OF FUSION C5-7 W/  PLATE REMOVAL   ARTHRODESIS METATARSALPHALANGEAL JOINT (MTPJ) Left 07/08/2021   Procedure: Left hallux metatarsophalangeal joint arthrodesis;  Surgeon: Kit Rush, MD;  Location: [facility];  Service: Orthopedics;  Laterality: Left;   BACK SURGERY     BUNIONECTOMY Left    CARDIOVASCULAR STRESS TEST  12/03/2019   myoc perf im: NORMAL   CERVICAL FUSION  2002   anterior C5 -- C7   CERVICAL SPINE SURGERY  08/27/1999     C6 -- T1  LAMINECTOMY/  DISKECTOMY   CESAREAN SECTION  1994   CHOLECYSTECTOMY N/A 03/06/2020   Procedure: LAPAROSCOPIC CHOLECYSTECTOMY;  Surgeon: Lyndel Deward PARAS, MD;  Location: [facility];  Service: General;  Laterality: N/A;   COLONOSCOPY W/ POLYPECTOMY  04/2008;08/2014   2016 tubular adenoma x 1; +diverticulosis and int/ext hemorrhoids.  06/2020 adenoma, recall 5 yrs.   CYSTOSCOPY WITH URETEROSCOPY AND STENT PLACEMENT Left 08/28/2013   Procedure: CYSTOSCOPY, RGP,  WITH URETEROSCOPY AND STENT REMOVAL;  Surgeon: Alm GORMAN Fragmin, MD;  Location: [facility];  Service: Urology;  Laterality: Left;   DEXA   09/2017   T-score -2.2 femur neck: improved compared to 06/2015.  2021 DEXA T score -2.3.  11/2021 DEXA T score -2.2.   ESOPHAGOGASTRODUODENOSCOPY  2010; 08/2014   2016 +Candidal esophagitis; Mild chronic gastritis w/intestinal metaplasia--NEG H pylori, neg for eosinophilic esoph. 06/2020 esoph candidiasis (diflucan  x 20d), o/w normal.   HAMMER TOE SURGERY Left 07/08/2021   Procedure: Second metatarsal head excision; revision Second hammertoe correction; 2-5 percutaneus flexor tenotomies;  Surgeon: Kit Rush, MD;  Location: [facility];  Service: Orthopedics;  Laterality: Left;   HARDWARE REMOVAL Left 07/08/2021   Procedure: Removal of deep implants from hallux proximal phalanx and First and Second metatarsals;  Surgeon: Kit Rush, MD;  Location: [facility];  Service: Orthopedics;  Laterality: Left;  HIP PINNING,CANNULATED Right 01/28/2023   Procedure: PERCUTANEOUS FIXATION OF RIGHT FEMORAL NECK;  Surgeon: Sharl Selinda Dover, MD;  Location: [facility];  Service: Orthopedics;  Laterality: Right;   HOLMIUM LASER APPLICATION Left 08/28/2013   Procedure: HOLMIUM LASER APPLICATION;  Surgeon: Alm GORMAN Fragmin, MD;  Location: [facility];  Service: Urology;  Laterality: Left;   LEFT HEART CATH AND CORONARY ANGIOGRAPHY N/A 05/15/2018   Evidence for spasm in distal LAD.  Mod RCA atherosclerosis, o/w no CAD.  Procedure: LEFT HEART CATH AND CORONARY ANGIOGRAPHY;  Surgeon: Anner Alm ORN, MD;  Location: [facility];  Service: Cardiovascular;  Laterality: N/A;   NEGATIVE SLEEP STUDY  04/01/2007   NEPHROLITHOTOMY Left 08/05/2013   Procedure: NEPHROLITHOTOMY PERCUTANEOUS;  Surgeon: Alm GORMAN Fragmin, MD;  Location: [facility];  Service: Urology;  Laterality: Left;   POLYSOMNOGRAM  01/2019   NORMAL   POLYSOMNOGRAPHY  10/25/2018   Dr. Wilbert Passey due to pt inadequat sleep time (83 min).   TONSILLECTOMY AND ADENOIDECTOMY  1975   TOTAL KNEE  ARTHROPLASTY Right 2003   TOTAL KNEE REVISION Right 01/19/2022   Procedure: Right knee polyethylene revision;  Surgeon: Melodi Lerner, MD;  Location: [facility];  Service: Orthopedics;  Laterality: Right;   TRANSTHORACIC ECHOCARDIOGRAM  05/2018; 12/03/2019   (after MI secondary to arterial spasm): EF 45-50%; severe hypokinesis of apical anterolateral, inferolateral , anterior, and inferior LV myocardium. 11/2019 EF 55-60%, valves fine   ULTRASOUND EXAM,PELVIC COMPLETE ([facility] HX)  06/25/2015   NORMAL (done by GYN)    Outpatient Medications Prior to Visit  Medication Sig Dispense Refill   albuterol  (VENTOLIN  HFA) 108 (90 Base) MCG/ACT inhaler TAKE 2 PUFFS BY MOUTH EVERY 6 HOURS AS NEEDED FOR WHEEZE OR SHORTNESS OF BREATH 18 each 0   ALPRAZolam  (XANAX ) 0.5 MG tablet TAKE 1-2 TABLETS BY MOUTH THREE TIMES A DAY AS NEEDED FOR ANXIETY 180 tablet 5   atorvastatin  (LIPITOR ) 80 MG tablet TAKE 1 TABLET BY MOUTH EVERY DAY 90 tablet 1   buPROPion  (WELLBUTRIN  XL) 300 MG 24 hr tablet TAKE 1 TABLET BY MOUTH EVERY DAY 90 tablet 1   citalopram  (CELEXA ) 40 MG tablet TAKE 1 TABLET BY MOUTH EVERY DAY 30 tablet 3   famotidine  (PEPCID ) 40 MG tablet TAKE 1 TABLET BY MOUTH EVERY DAY 90 tablet 1   fluticasone  (FLONASE ) 50 MCG/ACT nasal spray SPRAY 2 SPRAYS INTO EACH NOSTRIL EVERY DAY 48 mL 1   HYDROcodone -acetaminophen  (NORCO/VICODIN) 5-325 MG tablet 1-2 tab po bid as needed for pain 90 tablet 0   lamoTRIgine  (LAMICTAL ) 100 MG tablet TAKE 3 TABLETS (300 MG TOTAL) BY MOUTH AT BEDTIME 270 tablet 2   loratadine  (CLARITIN ) 10 MG tablet Take 10 mg by mouth daily.     losartan  (COZAAR ) 25 MG tablet 1 tab po bid 180 tablet 3   meloxicam  (MOBIC ) 15 MG tablet TAKE 1 TABLET BY MOUTH ONCE DAILY AS NEEDED FOR JOINT PAIN 30 tablet 2   metoprolol  succinate (TOPROL -XL) 100 MG 24 hr tablet 1 and 1/2 tabs p.o. daily.  Take with or immediately following a meal. 135 tablet 1   montelukast  (SINGULAIR ) 10 MG tablet TAKE 1 TABLET BY MOUTH EVERY  DAY IN THE EVENING 90 tablet 1   nitroGLYCERIN  (NITROSTAT ) 0.4 MG SL tablet PLACE 1 TABLET (0.4 MG TOTAL) UNDER THE TONGUE EVERY 5 (FIVE) MINUTES X 3 DOSES AS NEEDED FOR CHEST PAIN. 25 tablet 1   ondansetron  (ZOFRAN -ODT) 4 MG disintegrating tablet Take 1 tablet (4 mg  total) by mouth every 8 (eight) hours as needed. 8 tablet 0   pantoprazole  (PROTONIX ) 40 MG tablet TAKE 1 TABLET (40 MG TOTAL) BY MOUTH DAILY. KEEP F/U APPT FOR ANY FURTHER REFILLS 90 tablet 0   potassium chloride  SA (KLOR-CON  M) 20 MEQ tablet TAKE 1 TABLET BY MOUTH EVERY DAY 90 tablet 1   Tiotropium Bromide -Olodaterol (STIOLTO RESPIMAT ) 2.5-2.5 MCG/ACT AERS Inhale 2 each into the lungs daily. 4 g 5   hydrochlorothiazide  (HYDRODIURIL ) 12.5 MG tablet 2 tabs daily. 60 tablet 0   amLODipine  (NORVASC ) 5 MG tablet Take 1.5 tablets (7.5 mg total) by mouth daily. (Patient not taking: Reported on 10/16/2023) 30 tablet 0   No facility-administered medications prior to visit.    Allergies  Allergen Reactions   Amlodipine  Swelling   Ceftin [Cefuroxime Axetil] Anaphylaxis and Swelling    Swelling of eyes and tongue   Cipro  [Ciprofloxacin  Hcl] Anaphylaxis and Swelling   Risperdal  [Risperidone ] Palpitations    Generalized weakness Malaise  Hypertension   Fosamax  [Alendronate  Sodium] Diarrhea   Linzess [Linaclotide] Diarrhea   Morphine  And Codeine Other (See Comments)    Hallucinations Disorientation    Movantik  [Naloxegol ]     N/v   Prednisone  Other (See Comments)    Hyperactivity   Roxicodone  [Oxycodone ] Other (See Comments)    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     Review of Systems As per HPI  PE:    10/16/2023   11:05 AM 10/09/2023    4:00 PM 09/01/2023    2:56 PM  Vitals with BMI  Height 5' 8    Weight 180 lbs 3 oz 183 lbs   BMI 27.41    Systolic 124 104 861  Diastolic 73 64 80  Pulse 64 67 77    Physical Exam  Gen: Alert, well appearing.  Patient is  oriented to person, place, time, and situation. AFFECT: pleasant, lucid thought and speech. CV: RRR, no m/r/g.   LUNGS: CTA bilat, nonlabored resps, good aeration in all lung fields. EXT: no clubbing or cyanosis.  no edema.    LABS:  Last metabolic panel Lab Results  Component Value Date   GLUCOSE 105 (H) 10/09/2023   NA 135 10/09/2023   K 4.0 10/09/2023   CL 99 10/09/2023   CO2 27 10/09/2023   BUN 25 10/09/2023   CREATININE 1.19 (H) 10/09/2023   EGFR 49 (L) 10/09/2023   CALCIUM  9.3 10/09/2023   PROT 6.3 10/09/2023   ALBUMIN  3.8 07/25/2023   BILITOT 0.4 10/09/2023   ALKPHOS 63 07/25/2023   AST 14 10/09/2023   ALT 14 10/09/2023   ANIONGAP 9 07/25/2023   IMPRESSION AND PLAN:  #1 uncontrolled hypertension.  History of hypotension. Continue Toprol -XL 200 mg a day and HCTZ 25 mg a day (new prescription for 25 mg tab today) and losartan  50 mg twice a day. She requests referral to nephrology to see if they can help with blood pressure control.   #2.  Chronic renal insufficiency stage IIIa. She avoids NSAIDs. We have discussed appropriate hydration. We do have her on HCTZ 25 mg a day and losartan  25 mg twice a day but kidney function has not dropped from these medications. Referral to nephrology as per patient request.  #3 amlodipine  induced swelling. Resolved with discontinuation of amlodipine .  An After Visit Summary was printed and given to the patient.  FOLLOW UP: Return in about 6 weeks (around 11/27/2023) for routine chronic illness  f/u.  Signed:  Gerlene Hockey, MD           10/16/2023

## 2023-10-17 ENCOUNTER — Ambulatory Visit: Admitting: Physical Therapy

## 2023-10-17 ENCOUNTER — Encounter: Payer: Self-pay | Admitting: Physical Therapy

## 2023-10-17 DIAGNOSIS — R2689 Other abnormalities of gait and mobility: Secondary | ICD-10-CM | POA: Diagnosis not present

## 2023-10-17 DIAGNOSIS — M25551 Pain in right hip: Secondary | ICD-10-CM

## 2023-10-17 NOTE — Therapy (Signed)
 OUTPATIENT PHYSICAL THERAPY LOWER EXTREMITY TREATMENT   Patient Name: Erin Lopez MRN: [id number] DOB:[birth year], 73 y.o., female Today's Date: 10/17/2023  END OF SESSION:  PT End of Session - 10/17/23 0855     Visit Number 2    Number of Visits 16    Date for PT Re-Evaluation 12/08/23    Authorization Type [employer]    PT Start Time [phone]    PT Stop Time 0930    PT Time Calculation (min) 38 min    Activity Tolerance Patient tolerated treatment well    Behavior During Therapy [facility] for tasks assessed/performed          Past Medical History:  Diagnosis Date   Acute encephalopathy 11/19/2017   Alcoholism in remission [facility])    states last alcohol 11 yrs ago   Anemia    Anxiety    Bipolar 1 disorder (HCC)    Admission for mania x 2 (most recent 11/2017)   Cervical spondylosis without myelopathy 12/19/2013   MRI 02/2014: multilevel DDD/spondylosis, not much change compared to prior MRI.  Pt not in favor of invasive therapy for her neck as of 04/2014.   Chronic constipation    Chronic renal insufficiency, stage 3 (moderate) (HCC)    COPD (chronic obstructive pulmonary disease) (HCC)    Moderate: anoro started 04/2017 by pulm, pt symptomatically improved and PFTs stable at f/u 06/2017.   Coronary artery spasm (HCC) 07/11/2018   nitrates=HA. Amlodipine =intol swelling.  Changed to coreg  09/03/18 by cardiologist.   Depression    Endometritis 05/2017   possible; empiric tx with Flagyl  by GYN.   Femoral neck fracture (HCC)    right, 01/28/23-->ORIF   Fibromyalgia    GERD (gastroesophageal reflux disease)    Hepatic steatosis 03/2019   u/s abd   Herpes zoster 08/07/2017   L scapular region   Hiatal hernia    History of adenomatous polyp of colon 04/2008; 08/2014   No high grade dysplasia: recall 5 yrs (Dr. Dianna, [facility])   History of basal cell carcinoma excision    NOSE   History of Helicobacter pylori infection 04/2008   +gastric biopsy (gastritis but no  metaplasia, dysplasia, or malignancy identified)   History of kidney stones    History of TIA (transient ischemic attack)    secondary to HELLP syndrome 1994 post c/s--  residual memory loss   Hyperlipidemia    statin started after her NSTEMI: goal  LDL <70.   Hypertension    Ischemic cardiomyopathy 05/2018   Echo EF 45-50% in context of NSTEMI from CA spasm. 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 Magdalen 04/2017.   Memory loss    Abnl MRI brain and CT brain c/w chronic microvascular ischemia.  Repeat MRI brain 02/2014 stable (Dr. Smitty with no plan to f/u with neuro as of 04/2014)   NSTEMI (non-ST elevated myocardial infarction) (HCC) 05/2018   Echo EF 45-50% in context of NSTEMI from CA spasm.// Myoview  8/21: EF 66, no ischemia, low risk   Osteoarthritis of left wrist    STT and [facility] jt   Osteoporosis 05/2010   2012 'penia; 06/2015 'porosis:  prolia .  DEXA 09/2017 T-score -2.2 femur neck. 11/2021 T score -2.2. Rpt 2 yrs   Pelvic floor dysfunction  [facility] (Dr. Cam)   Postmenopausal vaginal bleeding 05/2017   x 1 small episode; GYN attempted endo bx but unable to penetrate cervix due to severe cerv stenosis (due to postmenopausal state + hx of LEEP).  Endo u/s showed thin endo lining.  Per GYN---no evidence of endomet pathology on w/u---obs as of 07/2017.   Prediabetes 06/2016   Fasting gluc 105; HbA1c at that time was 6.0%.  A1c 6.2% 12/2017.  A1c 5.7% Feb 2023.   Recurrent kidney stones 08/2013   Left ureteral calculus: Perc nephr--cystoscopy w/ureteroscopy + laser for stone removal.  Residual asymptomatic left renal nephrolithiasis <51mm present post-procedure.  Right sided hydronephrosis-persistent (on u/s)--urol ordered CT to  further eval 08/2016: 1.6 cm nonobstructing stone lower pole left kidney, no hydro, plan for PCN extraction ([facility])   Sepsis (HCC)    UTI   Suprapubic discomfort 06/05/2015   TMJ (temporomandibular joint disorder)    USES  MOUTH GUARD   Toe fracture, left 12/2017   2nd toe prox phalanx (immobilization, Dr. Elner)   Vitamin D deficiency 05/2011   Past Surgical History:  Procedure Laterality Date   ABIs  11/29/2019   NORMAL   ANTERIOR CERVICAL DECOMP/DISCECTOMY FUSION  06/30/2009   C3 -- C5  AND EXPLORATION OF FUSION C5-7 W/  PLATE REMOVAL   ARTHRODESIS METATARSALPHALANGEAL JOINT (MTPJ) Left 07/08/2021   Procedure: Left hallux metatarsophalangeal joint arthrodesis;  Surgeon: Kit Rush, MD;  Location: [facility];  Service: Orthopedics;  Laterality: Left;   BACK SURGERY     BUNIONECTOMY Left    CARDIOVASCULAR STRESS TEST  12/03/2019   myoc perf im: NORMAL   CERVICAL FUSION  2002   anterior C5 -- C7   CERVICAL SPINE SURGERY  08/27/1999     C6 -- T1  LAMINECTOMY/  DISKECTOMY   CESAREAN SECTION  1994   CHOLECYSTECTOMY N/A 03/06/2020   Procedure: LAPAROSCOPIC CHOLECYSTECTOMY;  Surgeon: Lyndel Deward PARAS, MD;  Location: [facility];  Service: General;  Laterality: N/A;   COLONOSCOPY W/ POLYPECTOMY  04/2008;08/2014   2016 tubular adenoma x 1; +diverticulosis and int/ext hemorrhoids.  06/2020 adenoma, recall 5 yrs.   CYSTOSCOPY WITH URETEROSCOPY AND STENT PLACEMENT Left 08/28/2013   Procedure: CYSTOSCOPY, RGP,  WITH URETEROSCOPY AND STENT REMOVAL;  Surgeon: Alm GORMAN Fragmin, MD;  Location: [facility];  Service: Urology;  Laterality: Left;   DEXA  09/2017   T-score -2.2 femur neck: improved compared to 06/2015.  2021 DEXA T score -2.3.  11/2021 DEXA T score -2.2.   ESOPHAGOGASTRODUODENOSCOPY  2010; 08/2014   2016 +Candidal esophagitis; Mild chronic gastritis w/intestinal metaplasia--NEG H pylori, neg for eosinophilic esoph. 06/2020 esoph candidiasis (diflucan  x 20d), o/w  normal.   HAMMER TOE SURGERY Left 07/08/2021   Procedure: Second metatarsal head excision; revision Second hammertoe correction; 2-5 percutaneus flexor tenotomies;  Surgeon: Kit Rush, MD;  Location: [facility];  Service: Orthopedics;  Laterality: Left;   HARDWARE REMOVAL Left 07/08/2021   Procedure: Removal of deep implants from hallux proximal phalanx and First and Second metatarsals;  Surgeon: Kit Rush, MD;  Location: [facility];  Service: Orthopedics;  Laterality: Left;   HIP PINNING,CANNULATED Right 01/28/2023   Procedure: PERCUTANEOUS FIXATION OF RIGHT FEMORAL NECK;  Surgeon: Sharl Selinda Dover, MD;  Location: [facility];  Service: Orthopedics;  Laterality: Right;   HOLMIUM LASER APPLICATION Left 08/28/2013   Procedure: HOLMIUM LASER APPLICATION;  Surgeon: Alm GORMAN Fragmin, MD;  Location: [facility];  Service:  Urology;  Laterality: Left;   LEFT HEART CATH AND CORONARY ANGIOGRAPHY N/A 05/15/2018   Evidence for spasm in distal LAD.  Mod RCA atherosclerosis, o/w no CAD.  Procedure: LEFT HEART CATH AND CORONARY ANGIOGRAPHY;  Surgeon: Anner Alm ORN, MD;  Location: [facility] INVASIVE CV LAB;  Service: Cardiovascular;  Laterality: N/A;   NEGATIVE SLEEP STUDY  04/01/2007   NEPHROLITHOTOMY Left 08/05/2013   Procedure: NEPHROLITHOTOMY PERCUTANEOUS;  Surgeon: Alm GORMAN Fragmin, MD;  Location: [facility];  Service: Urology;  Laterality: Left;   POLYSOMNOGRAM  01/2019   NORMAL   POLYSOMNOGRAPHY  10/25/2018   Dr. Wilbert Passey due to pt inadequat sleep time (83 min).   TONSILLECTOMY AND ADENOIDECTOMY  1975   TOTAL KNEE ARTHROPLASTY Right 2003   TOTAL KNEE REVISION Right 01/19/2022   Procedure: Right knee polyethylene revision;  Surgeon: Melodi Lerner, MD;  Location: [facility];  Service: Orthopedics;  Laterality: Right;   TRANSTHORACIC ECHOCARDIOGRAM  05/2018; 12/03/2019   (after MI secondary to arterial spasm): EF 45-50%; severe hypokinesis of apical  anterolateral, inferolateral , anterior, and inferior LV myocardium. 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   Healthcare maintenance 04/19/2019   Overweight (BMI 25.0-29.9) 02/19/2019   Ischemic cardiomyopathy 09/03/2018   Coronary vasospasm (HCC) 07/11/2018   NSTEMI (non-ST elevated myocardial infarction) (HCC) 05/14/2018   Hyponatremia 11/16/2017   Bipolar 1 disorder (HCC) 12/29/2016   Hypertension 12/29/2016   History of TIA (transient ischemic attack) 12/29/2016   Chronic pelvic pain in female 08/22/2016   Stage 2 chronic kidney disease 08/22/2016   Osteoporosis 09/30/2015   Health maintenance examination 06/11/2015   Urethral caruncle 06/05/2015   UTI (urinary tract infection) 06/05/2015   TMJ (temporomandibular joint disorder) 02/25/2015   Right ankle injury 11/05/2014   Cervical spondylosis without myelopathy 12/19/2013   Sprain of neck 12/19/2013   Memory difficulties 12/19/2013   Postnasal drip 10/28/2013   Constipation, chronic 07/29/2013   COPD (chronic obstructive pulmonary disease) (HCC) 06/30/2013   Kidney stone on left side 05/29/2013   Hyperlipidemia 05/29/2013     PCP: Phiip McGowen  REFERRING PROVIDER: Phiip McGowen  REFERRING DIAG: R hip pain   THERAPY DIAG:  Pain in right hip  Other abnormalities of gait and mobility  Rationale for Evaluation and Treatment: Rehabilitation  ONSET DATE:  01/28/23   SUBJECTIVE:   SUBJECTIVE STATEMENT: Pt states feet/ankles are sore this am. R hip sore.    Eval: Pt had fall, suffering R femur fracture on 01/28/23 last year.  She  had fixation of femoral neck by Dr. Sharl. States difficulty in hospital and recovering from this, had sepsis, etc. She had difficulty with mobility after this, did not have much PT, had some home heath. States R hip still sore, notes pain in lateral hip with activity and walking.  She also had another fall in April at home, fell hard, broke nose, hurt face, concussion and broke R wrist. Still wearing brace on R hand, but reports wrist doing well at this time.  She notes difficulty with BP lately, being high and low. States difficulty with motivation lately, and feels  like she has no energy- MD aware.  Reports sleeping well at night.  Lives alone, does have life alert and does drive.  States she feels something moving in hip when she turns her leg a certain direction ( S/L Hip er/ir)    PERTINENT HISTORY: CKD, HTN,  TIA,  COPD, Osteoporosis, Bipolar,  R TKA and revision  PAIN:  Are you having pain? Yes: NPRS scale: 4/10 Pain location: R lateral hip Pain description: sore Aggravating factors: unable to state  Relieving factors: unable to state   PRECAUTIONS: Fall  WEIGHT BEARING RESTRICTIONS: No  FALLS:  Has patient fallen in last 6 months? Yes. Number of falls 2 - broke R wrist/outside got out of car,     PLOF: Independent   PATIENT GOALS:  Decreased pain in hip, increased activity level.   NEXT MD VISIT:   OBJECTIVE:   DIAGNOSTIC FINDINGS:   PATIENT SURVEYS:    COGNITION: Overall cognitive status: Within functional limits for tasks assessed     SENSATION: WFL  EDEMA: n/a   POSTURE:    No Significant postural limitations  PALPATION: Tenderness at lateral R hip, and gr trochanter   LOWER EXTREMITY ROM: Hip flex, IR, ER- mild limitations Knees: WFL   LOWER EXTREMITY MMT:  MMT Left eval Right  eval  Hip flexion 4+ 4-  Hip extension    Hip abduction  3/pain  Hip adduction    Hip internal rotation    Hip external rotation 4+ 4  Knee flexion 5 4+  Knee  extension 5 4  Ankle dorsiflexion    Ankle plantarflexion    Ankle inversion    Ankle eversion     (Blank rows = not tested)  LOWER EXTREMITY SPECIAL TESTS:    FUNCTIONAL TESTS:  Will test balance in future sessions- pt with decreased balance/stability, not using AD at this time.   GAIT: Distance walked: 150 Assistive device utilized: None Level of assistance: Complete Independence Comments: R valgus knee, decreased knee extension and heel strike on R.    TODAY'S TREATMENT:                                                                                                                              DATE:   10/17/2023 Therapeutic Exercise: Aerobic: Supine:   SLR 2 x 10 bil;  Bridging 3 x 5 - with practice with breathing; hip ER GTB x 20;  Seated:  LAQ 3 lb x 20 bil;   Standing: Stretches:  Neuromuscular Re-education: Manual Therapy: Therapeutic Activity:  sit to stand 2 x 6 cueing for mechanics;  trial fwd and lateral step up- audible grinding in R hip- stopped;   Self Care:   Ther ex:  Supine: SLR 2 x 10 on R;  Hip clams GTB x 15;(sore by end) ;  LAQ x 15 on L;    PATIENT EDUCATION:  Education details: updated and reviewed HEP Person educated: Patient Education method: Explanation,  Demonstration, Tactile cues, Verbal cues, and Handouts Education comprehension: verbalized understanding, returned demonstration, verbal cues required, tactile cues required, and needs further education   HOME EXERCISE PROGRAM: Access Code: [email or web address] URL: [email or web address] Date: 10/13/2023 Prepared by: Tinnie Don Exercises - Straight Leg Raise  - 1 x daily - 2 sets - 5- 10 reps - Seated Knee Extension AROM  - 2 x daily - 2 sets - 10 reps  ASSESSMENT:  CLINICAL IMPRESSION: Pt with painful/audible Grinding in R hip with trial for fwd and lateral step ups. She will continue to benefit from light/painfree strengthening of R hip as tolerated.    Eval: Patient  presents with primary complaint of  pain in R hip. She had previous fracture with surgery last year, and continues to have pain and deficit since. She has weakness in R hip and increased pain with movement. She is overall deconditioned and would benefit from increasing mobility. She also has difficulty with balance and has had a few falls with injury. She will benefit from further evaluation of balance at future visits, and treatment of this as needed, to prevent further falls. She has decreased gait mechanics, with valgus R knee and decreased heel strike/short stride on R.  Pt with decreased ability for full functional activities. Pt will  benefit from skilled PT to improve deficits and pain and to return to PLOF.   OBJECTIVE IMPAIRMENTS: Abnormal gait, decreased activity tolerance, decreased coordination, decreased mobility, difficulty walking, decreased ROM, decreased strength, impaired flexibility, impaired vision/preception, improper body mechanics, and pain.   ACTIVITY LIMITATIONS: standing, squatting, stairs, transfers, and locomotion level  PARTICIPATION LIMITATIONS: meal prep, cleaning, laundry, driving, shopping, and community activity  PERSONAL FACTORS: Past/current experiences, Time since onset of injury/illness/exacerbation, and 1 comorbidity: surgery are also affecting patient's functional outcome.   REHAB POTENTIAL: Good  CLINICAL DECISION MAKING: Evolving/moderate complexity  EVALUATION COMPLEXITY: Moderate   GOALS: Goals reviewed with patient? Yes  SHORT TERM GOALS: Target date: 11/03/2023   Pt to be independent with initial HEP  Goal status: INITIAL  2.  Pt to demo R hip ROM to be pain free in all directions.   Goal status: INITIAL    LONG TERM GOALS: Target date: 12/08/2023   Pt to be independent with final HEP  Goal status: INITIAL  2.  Pt to report decreased pain in R hip to 0-3/10 with activity and walking   Goal status: INITIAL  3.  Pt to demo improved  strength of R hip to be at least 4/5 , to improve stability, gait and pain   Goal status: INITIAL  4.  Balance goal TBD  Goal status: INITIAL    PLAN:  PT FREQUENCY: 1-2x/week  PT DURATION: 8 weeks  PLANNED INTERVENTIONS: Therapeutic exercises, Therapeutic activity, Neuromuscular re-education, Patient/Family education, Self Care, Joint mobilization, Joint manipulation, Stair training, Orthotic/Fit training, DME instructions, Aquatic Therapy, Dry Needling, Electrical stimulation, Cryotherapy, Moist heat, Taping, Ultrasound, Ionotophoresis 4mg /ml Dexamethasone , Manual therapy,  Vasopneumatic device, Traction, Spinal manipulation, Spinal mobilization,Balance training, Gait training,   PLAN FOR NEXT SESSION:  hip strength, hip abd strength, Lateral hip pain, side stepping,    Tinnie Don, PT, DPT 8:55 AM  10/17/23

## 2023-10-18 ENCOUNTER — Encounter: Payer: Self-pay | Admitting: Physical Therapy

## 2023-10-18 ENCOUNTER — Ambulatory Visit: Payer: Self-pay | Admitting: Physical Therapy

## 2023-10-18 DIAGNOSIS — M25551 Pain in right hip: Secondary | ICD-10-CM | POA: Diagnosis not present

## 2023-10-18 DIAGNOSIS — R2689 Other abnormalities of gait and mobility: Secondary | ICD-10-CM

## 2023-10-18 NOTE — Therapy (Signed)
 OUTPATIENT PHYSICAL THERAPY LOWER EXTREMITY TREATMENT   Patient Name: Erin Lopez MRN: [id number] DOB:1950-12-06, 73 y.o., female Today's Date: 10/18/2023  END OF SESSION:  PT End of Session - 10/18/23 1106     Visit Number 3    Number of Visits 16    Date for PT Re-Evaluation 12/08/23    Authorization Type [employer]    PT Start Time 1106    PT Stop Time 1146    PT Time Calculation (min) 40 min    Activity Tolerance Patient tolerated treatment well    Behavior During Therapy [facility] for tasks assessed/performed          Past Medical History:  Diagnosis Date   Acute encephalopathy 11/19/2017   Alcoholism in remission (HCC)    states last alcohol 11 yrs ago   Anemia    Anxiety    Bipolar 1 disorder (HCC)    Admission for mania x 2 (most recent 11/2017)   Cervical spondylosis without myelopathy 12/19/2013   MRI 02/2014: multilevel DDD/spondylosis, not much change compared to prior MRI.  Pt not in favor of invasive therapy for her neck as of 04/2014.   Chronic constipation    Chronic renal insufficiency, stage 3 (moderate) (HCC)    COPD (chronic obstructive pulmonary disease) (HCC)    Moderate: anoro started 04/2017 by pulm, pt symptomatically improved and PFTs stable at f/u 06/2017.   Coronary artery spasm (HCC) 07/11/2018   nitrates=HA. Amlodipine =intol swelling.  Changed to coreg  09/03/18 by cardiologist.   Depression    Endometritis 05/2017   possible; empiric tx with Flagyl  by GYN.   Femoral neck fracture (HCC)    right, 01/28/23-->ORIF   Fibromyalgia    GERD (gastroesophageal reflux disease)    Hepatic steatosis 03/2019   u/s abd   Herpes zoster 08/07/2017   L scapular region   Hiatal hernia    History of adenomatous polyp of colon 04/2008; 08/2014   No high grade dysplasia: recall 5 yrs (Dr. Dianna, [facility])   History of basal cell carcinoma excision    NOSE   History of Helicobacter pylori infection 04/2008   +gastric biopsy (gastritis but no  metaplasia, dysplasia, or malignancy identified)   History of kidney stones    History of TIA (transient ischemic attack)    secondary to HELLP syndrome 1994 post c/s--  residual memory loss   Hyperlipidemia    statin started after her NSTEMI: goal  LDL <70.   Hypertension    Ischemic cardiomyopathy 05/2018   Echo EF 45-50% in context of NSTEMI from CA spasm. 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 Magdalen 04/2017.   Memory loss    Abnl MRI brain and CT brain c/w chronic microvascular ischemia.  Repeat MRI brain 02/2014 stable (Dr. Smitty with no plan to f/u with neuro as of 04/2014)   NSTEMI (non-ST elevated myocardial infarction) (HCC) 05/2018   Echo EF 45-50% in context of NSTEMI from CA spasm.// Myoview  8/21: EF 66, no ischemia, low risk   Osteoarthritis of left wrist    STT and [facility] jt   Osteoporosis 05/2010   2012 'penia; 06/2015 'porosis:  prolia .  DEXA 09/2017 T-score -2.2 femur neck. 11/2021 T score -2.2. Rpt 2 yrs   Pelvic floor dysfunction  [facility] (Dr. Cam)   Postmenopausal vaginal bleeding 05/2017   x 1 small episode; GYN attempted endo bx but unable to penetrate cervix due to severe cerv stenosis (due to postmenopausal state + hx of LEEP).  Endo u/s showed thin endo lining.  Per GYN---no evidence of endomet pathology on w/u---obs as of 07/2017.   Prediabetes 06/2016   Fasting gluc 105; HbA1c at that time was 6.0%.  A1c 6.2% 12/2017.  A1c 5.7% Feb 2023.   Recurrent kidney stones 08/2013   Left ureteral calculus: Perc nephr--cystoscopy w/ureteroscopy + laser for stone removal.  Residual asymptomatic left renal nephrolithiasis <70mm present post-procedure.  Right sided hydronephrosis-persistent (on u/s)--urol ordered CT to  further eval 08/2016: 1.6 cm nonobstructing stone lower pole left kidney, no hydro, plan for PCN extraction ([facility])   Sepsis (HCC)    UTI   Suprapubic discomfort 06/05/2015   TMJ (temporomandibular joint disorder)    USES  MOUTH GUARD   Toe fracture, left 12/2017   2nd toe prox phalanx (immobilization, Dr. Elner)   Vitamin D deficiency 05/2011   Past Surgical History:  Procedure Laterality Date   ABIs  11/29/2019   NORMAL   ANTERIOR CERVICAL DECOMP/DISCECTOMY FUSION  06/30/2009   C3 -- C5  AND EXPLORATION OF FUSION C5-7 W/  PLATE REMOVAL   ARTHRODESIS METATARSALPHALANGEAL JOINT (MTPJ) Left 07/08/2021   Procedure: Left hallux metatarsophalangeal joint arthrodesis;  Surgeon: Kit Rush, MD;  Location: [facility];  Service: Orthopedics;  Laterality: Left;   BACK SURGERY     BUNIONECTOMY Left    CARDIOVASCULAR STRESS TEST  12/03/2019   myoc perf im: NORMAL   CERVICAL FUSION  2002   anterior C5 -- C7   CERVICAL SPINE SURGERY  08/27/1999     C6 -- T1  LAMINECTOMY/  DISKECTOMY   CESAREAN SECTION  1994   CHOLECYSTECTOMY N/A 03/06/2020   Procedure: LAPAROSCOPIC CHOLECYSTECTOMY;  Surgeon: Lyndel Deward PARAS, MD;  Location: [facility];  Service: General;  Laterality: N/A;   COLONOSCOPY W/ POLYPECTOMY  04/2008;08/2014   2016 tubular adenoma x 1; +diverticulosis and int/ext hemorrhoids.  06/2020 adenoma, recall 5 yrs.   CYSTOSCOPY WITH URETEROSCOPY AND STENT PLACEMENT Left 08/28/2013   Procedure: CYSTOSCOPY, RGP,  WITH URETEROSCOPY AND STENT REMOVAL;  Surgeon: Alm GORMAN Fragmin, MD;  Location: [facility];  Service: Urology;  Laterality: Left;   DEXA  09/2017   T-score -2.2 femur neck: improved compared to 06/2015.  2021 DEXA T score -2.3.  11/2021 DEXA T score -2.2.   ESOPHAGOGASTRODUODENOSCOPY  2010; 08/2014   2016 +Candidal esophagitis; Mild chronic gastritis w/intestinal metaplasia--NEG H pylori, neg for eosinophilic esoph. 06/2020 esoph candidiasis (diflucan  x 20d), o/w  normal.   HAMMER TOE SURGERY Left 07/08/2021   Procedure: Second metatarsal head excision; revision Second hammertoe correction; 2-5 percutaneus flexor tenotomies;  Surgeon: Kit Rush, MD;  Location: [facility];  Service: Orthopedics;  Laterality: Left;   HARDWARE REMOVAL Left 07/08/2021   Procedure: Removal of deep implants from hallux proximal phalanx and First and Second metatarsals;  Surgeon: Kit Rush, MD;  Location: [facility];  Service: Orthopedics;  Laterality: Left;   HIP PINNING,CANNULATED Right 01/28/2023   Procedure: PERCUTANEOUS FIXATION OF RIGHT FEMORAL NECK;  Surgeon: Sharl Selinda Dover, MD;  Location: [facility];  Service: Orthopedics;  Laterality: Right;   HOLMIUM LASER APPLICATION Left 08/28/2013   Procedure: HOLMIUM LASER APPLICATION;  Surgeon: Alm GORMAN Fragmin, MD;  Location: [facility];  Service:  Urology;  Laterality: Left;   LEFT HEART CATH AND CORONARY ANGIOGRAPHY N/A 05/15/2018   Evidence for spasm in distal LAD.  Mod RCA atherosclerosis, o/w no CAD.  Procedure: LEFT HEART CATH AND CORONARY ANGIOGRAPHY;  Surgeon: Anner Alm ORN, MD;  Location: [facility] INVASIVE CV [facility];  Service: Cardiovascular;  Laterality: N/A;   NEGATIVE SLEEP STUDY  04/01/2007   NEPHROLITHOTOMY Left 08/05/2013   Procedure: NEPHROLITHOTOMY PERCUTANEOUS;  Surgeon: Alm GORMAN Fragmin, MD;  Location: [facility];  Service: Urology;  Laterality: Left;   POLYSOMNOGRAM  01/2019   NORMAL   POLYSOMNOGRAPHY  10/25/2018   Dr. Wilbert Passey due to pt inadequat sleep time (83 min).   TONSILLECTOMY AND ADENOIDECTOMY  1975   TOTAL KNEE ARTHROPLASTY Right 2003   TOTAL KNEE REVISION Right 01/19/2022   Procedure: Right knee polyethylene revision;  Surgeon: Melodi Lerner, MD;  Location: [facility];  Service: Orthopedics;  Laterality: Right;   TRANSTHORACIC ECHOCARDIOGRAM  05/2018; 12/03/2019   (after MI secondary to arterial spasm): EF 45-50%; severe hypokinesis of apical  anterolateral, inferolateral , anterior, and inferior LV myocardium. 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   Healthcare maintenance 04/19/2019   Overweight (BMI 25.0-29.9) 02/19/2019   Ischemic cardiomyopathy 09/03/2018   Coronary vasospasm (HCC) 07/11/2018   NSTEMI (non-ST elevated myocardial infarction) (HCC) 05/14/2018   Hyponatremia 11/16/2017   Bipolar 1 disorder (HCC) 12/29/2016   Hypertension 12/29/2016   History of TIA (transient ischemic attack) 12/29/2016   Chronic pelvic pain in female 08/22/2016   Stage 2 chronic kidney disease 08/22/2016   Osteoporosis 09/30/2015   Health maintenance examination 06/11/2015   Urethral caruncle 06/05/2015   UTI (urinary tract infection) 06/05/2015   TMJ (temporomandibular joint disorder) 02/25/2015   Right ankle injury 11/05/2014   Cervical spondylosis without myelopathy 12/19/2013   Sprain of neck 12/19/2013   Memory difficulties 12/19/2013   Postnasal drip 10/28/2013   Constipation, chronic 07/29/2013   COPD (chronic obstructive pulmonary disease) (HCC) 06/30/2013   Kidney stone on left side 05/29/2013   Hyperlipidemia 05/29/2013     PCP: Phiip McGowen  REFERRING PROVIDER: Phiip McGowen  REFERRING DIAG: R hip pain   THERAPY DIAG:  Pain in right hip  Other abnormalities of gait and mobility  Rationale for Evaluation and Treatment: Rehabilitation  ONSET DATE:  01/28/23   SUBJECTIVE:   SUBJECTIVE STATEMENT: Pt with no new complaints.    Eval: Pt had fall, suffering R femur fracture on 01/28/23 last year.  She had fixation of femoral  neck by Dr. Sharl. States difficulty in hospital and recovering from this, had sepsis, etc. She had difficulty with mobility after this, did not have much PT, had some home heath. States R hip still sore, notes pain in lateral hip with activity and walking.  She also had another fall in April at home, fell hard, broke nose, hurt face, concussion and broke R wrist. Still wearing brace on R hand, but reports wrist doing well at this time.  She notes difficulty with BP lately, being high and low. States difficulty with motivation lately, and feels like she has no energy-  MD aware.  Reports sleeping well at night.  Lives alone, does have life alert and does drive.  States she feels something moving in hip when she turns her leg a certain direction ( S/L Hip er/ir)    PERTINENT HISTORY: CKD, HTN,  TIA,  COPD, Osteoporosis, Bipolar,  R TKA and revision  PAIN:  Are you having pain? Yes: NPRS scale: 4/10 Pain location: R lateral hip Pain description: sore Aggravating factors: unable to state  Relieving factors: unable to state   PRECAUTIONS: Fall  WEIGHT BEARING RESTRICTIONS: No  FALLS:  Has patient fallen in last 6 months? Yes. Number of falls 2 - broke R wrist/outside got out of car,     PLOF: Independent   PATIENT GOALS:  Decreased pain in hip, increased activity level.   NEXT MD VISIT:   OBJECTIVE:   DIAGNOSTIC FINDINGS:   PATIENT SURVEYS:    COGNITION: Overall cognitive status: Within functional limits for tasks assessed     SENSATION: WFL  EDEMA: n/a   POSTURE:    No Significant postural limitations  PALPATION: Tenderness at lateral R hip, and gr trochanter   LOWER EXTREMITY ROM: Hip flex, IR, ER- mild limitations Knees: WFL   LOWER EXTREMITY MMT:  MMT Left eval Right  eval  Hip flexion 4+ 4-  Hip extension    Hip abduction  3/pain  Hip adduction    Hip internal rotation    Hip external rotation 4+ 4  Knee flexion 5 4+  Knee extension 5 4  Ankle  dorsiflexion    Ankle plantarflexion    Ankle inversion    Ankle eversion     (Blank rows = not tested)  LOWER EXTREMITY SPECIAL TESTS:    FUNCTIONAL TESTS:  Will test balance in future sessions- pt with decreased balance/stability, not using AD at this time.   GAIT: Distance walked: 150 Assistive device utilized: None Level of assistance: Complete Independence Comments: R valgus knee, decreased knee extension and heel strike on R.    TODAY'S TREATMENT:                                                                                                                              DATE:   10/18/2023  Therapeutic Exercise: Aerobic: Supine:   SLR 2 x 10 bil( tried slight Er on R- harder/slight pain)     hip ER GTB x 20;  Seated:  LAQ 3 lb x 20 on R;  S/L: trial hip abd- grinding in hip  Standing:  squats at counter x 12;  at table x 10 ( cueing for hip and knee placement )   Hip abd 3 x 5 on R;   side stepping at counter x 10 Stretches:  Neuromuscular Re-education: Manual Therapy: Therapeutic Activity:    Self Care:   Ther ex:  Supine: SLR 2 x 10 on R;  Hip clams GTB x 15;(sore by end) ;  LAQ x 15 on  L;    PATIENT EDUCATION:  Education details: updated and reviewed HEP Person educated: Patient Education method: Explanation, Demonstration, Tactile cues, Verbal cues, and Handouts Education comprehension: verbalized understanding, returned demonstration, verbal cues required, tactile cues required, and needs further education   HOME EXERCISE PROGRAM: Access Code: [email or web address] URL: [email or web address] Date: 10/13/2023 Prepared by: Tinnie Don Exercises - Straight Leg Raise  - 1 x daily - 2 sets - 5- 10 reps - Seated Knee Extension AROM  - 2 x daily - 2 sets - 10 reps  ASSESSMENT:  CLINICAL IMPRESSION: Pt with good tolerance for activities. Trial for s/l hip abd, grinding in lateral hip, but pt tolerated much better in standing position. Has some  baseline knee valgus that increases with activity, cueing for positioning with squats today. Will call and try to obtain any recent imaging of pts R hip.    Eval: Patient presents with primary complaint of  pain in R hip. She had previous fracture with surgery last year, and continues to have pain and deficit since. She has weakness in R hip and increased pain with movement. She is overall deconditioned and would benefit from increasing mobility. She also has difficulty with balance and has had a few falls with injury. She will benefit from further evaluation of balance at future visits, and treatment of this as needed, to prevent further falls. She has decreased gait mechanics, with valgus R knee and decreased heel strike/short stride on R.  Pt with decreased ability for full functional activities. Pt will  benefit from skilled PT to improve deficits and pain and to return to PLOF.   OBJECTIVE IMPAIRMENTS: Abnormal gait, decreased activity tolerance, decreased coordination, decreased mobility, difficulty walking, decreased ROM, decreased strength, impaired flexibility, impaired vision/preception, improper body mechanics, and pain.   ACTIVITY LIMITATIONS: standing, squatting, stairs, transfers, and locomotion level  PARTICIPATION LIMITATIONS: meal prep, cleaning, laundry, driving, shopping, and community activity  PERSONAL FACTORS: Past/current experiences, Time since onset of injury/illness/exacerbation, and 1 comorbidity: surgery are also affecting patient's functional outcome.   REHAB POTENTIAL: Good  CLINICAL DECISION MAKING: Evolving/moderate complexity  EVALUATION COMPLEXITY: Moderate   GOALS: Goals reviewed with patient? Yes  SHORT TERM GOALS: Target date: 11/03/2023   Pt to be independent with initial HEP  Goal status: INITIAL  2.  Pt to demo R hip ROM to be pain free in all directions.   Goal status: INITIAL    LONG TERM GOALS: Target date: 12/08/2023   Pt to be independent  with final HEP  Goal status: INITIAL  2.  Pt to report decreased pain in R hip to 0-3/10 with activity and walking   Goal status: INITIAL  3.  Pt to demo improved strength of R hip to be at least 4/5 , to improve stability, gait and pain   Goal status: INITIAL  4.  Balance goal TBD  Goal status: INITIAL    PLAN:  PT FREQUENCY: 1-2x/week  PT DURATION: 8 weeks  PLANNED INTERVENTIONS: Therapeutic exercises, Therapeutic activity, Neuromuscular re-education, Patient/Family education, Self Care, Joint mobilization, Joint manipulation, Stair training, Orthotic/Fit training, DME instructions, Aquatic Therapy, Dry Needling, Electrical stimulation, Cryotherapy, Moist heat, Taping, Ultrasound, Ionotophoresis 4mg /ml Dexamethasone , Manual therapy,  Vasopneumatic device, Traction, Spinal manipulation, Spinal mobilization,Balance training, Gait training,   PLAN FOR NEXT SESSION:  hip strength, hip abd strength, Lateral hip pain, side stepping,    Tinnie Don, PT, DPT 11:06 AM  10/18/23

## 2023-10-23 ENCOUNTER — Other Ambulatory Visit: Payer: Self-pay | Admitting: Family Medicine

## 2023-10-24 ENCOUNTER — Other Ambulatory Visit: Payer: Self-pay | Admitting: Family Medicine

## 2023-10-24 ENCOUNTER — Encounter: Payer: Self-pay | Admitting: Physical Therapy

## 2023-10-31 ENCOUNTER — Ambulatory Visit: Payer: Self-pay | Admitting: Physical Therapy

## 2023-10-31 ENCOUNTER — Encounter: Payer: Self-pay | Admitting: Physical Therapy

## 2023-10-31 DIAGNOSIS — M25551 Pain in right hip: Secondary | ICD-10-CM

## 2023-10-31 DIAGNOSIS — R2689 Other abnormalities of gait and mobility: Secondary | ICD-10-CM | POA: Diagnosis not present

## 2023-10-31 NOTE — Therapy (Signed)
 OUTPATIENT PHYSICAL THERAPY LOWER EXTREMITY TREATMENT   Patient Name: Erin Lopez MRN: [id number] DOB:1950/07/05, 73 y.o., female Today's Date: 10/31/2023  END OF SESSION:  PT End of Session - 10/31/23 1420     Visit Number 4    Number of Visits 16    Date for PT Re-Evaluation 12/08/23    Authorization Type [employer]    PT Start Time 1310    PT Stop Time 1350    PT Time Calculation (min) 40 min    Activity Tolerance Patient tolerated treatment well    Behavior During Therapy [facility] for tasks assessed/performed           Past Medical History:  Diagnosis Date   Acute encephalopathy 11/19/2017   Alcoholism in remission (HCC)    states last alcohol 11 yrs ago   Anemia    Anxiety    Bipolar 1 disorder (HCC)    Admission for mania x 2 (most recent 11/2017)   Cervical spondylosis without myelopathy 12/19/2013   MRI 02/2014: multilevel DDD/spondylosis, not much change compared to prior MRI.  Pt not in favor of invasive therapy for her neck as of 04/2014.   Chronic constipation    Chronic renal insufficiency, stage 3 (moderate) (HCC)    COPD (chronic obstructive pulmonary disease) (HCC)    Moderate: anoro started 04/2017 by pulm, pt symptomatically improved and PFTs stable at f/u 06/2017.   Coronary artery spasm (HCC) 07/11/2018   nitrates=HA. Amlodipine =intol swelling.  Changed to coreg  09/03/18 by cardiologist.   Depression    Endometritis 05/2017   possible; empiric tx with Flagyl  by GYN.   Femoral neck fracture (HCC)    right, 01/28/23-->ORIF   Fibromyalgia    GERD (gastroesophageal reflux disease)    Hepatic steatosis 03/2019   u/s abd   Herpes zoster 08/07/2017   L scapular region   Hiatal hernia    History of adenomatous polyp of colon 04/2008; 08/2014   No high grade dysplasia: recall 5 yrs (Dr. Dianna, [facility])   History of basal cell carcinoma excision    NOSE   History of Helicobacter pylori infection 04/2008   +gastric biopsy (gastritis but no  metaplasia, dysplasia, or malignancy identified)   History of kidney stones    History of TIA (transient ischemic attack)    secondary to HELLP syndrome 1994 post c/s--  residual memory loss   Hyperlipidemia    statin started after her NSTEMI: goal  LDL <70.   Hypertension    Ischemic cardiomyopathy 05/2018   Echo EF 45-50% in context of NSTEMI from CA spasm. 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 Magdalen 04/2017.   Memory loss    Abnl MRI brain and CT brain c/w chronic microvascular ischemia.  Repeat MRI brain 02/2014 stable (Dr. Smitty with no plan to f/u with neuro as of 04/2014)   NSTEMI (non-ST elevated myocardial infarction) (HCC) 05/2018   Echo EF 45-50% in context of NSTEMI from CA spasm.// Myoview  8/21: EF 66, no ischemia, low risk   Osteoarthritis of left wrist    STT and 1st [facility] jt   Osteoporosis 05/2010   2012 'penia; 06/2015 'porosis:  prolia .  DEXA 09/2017 T-score -2.2 femur neck. 11/2021 T score -2.2. Rpt 2 yrs   Pelvic floor dysfunction  [facility] (Dr. Cam)   Postmenopausal vaginal bleeding 05/2017   x 1 small episode; GYN attempted endo bx but unable to penetrate cervix due to severe cerv stenosis (due to postmenopausal state + hx of LEEP).  Endo u/s showed thin endo lining.  Per GYN---no evidence of endomet pathology on w/u---obs as of 07/2017.   Prediabetes 06/2016   Fasting gluc 105; HbA1c at that time was 6.0%.  A1c 6.2% 12/2017.  A1c 5.7% Feb 2023.   Recurrent kidney stones 08/2013   Left ureteral calculus: Perc nephr--cystoscopy w/ureteroscopy + laser for stone removal.  Residual asymptomatic left renal nephrolithiasis <27mm present post-procedure.  Right sided hydronephrosis-persistent (on u/s)--urol ordered CT to  further eval 08/2016: 1.6 cm nonobstructing stone lower pole left kidney, no hydro, plan for PCN extraction ([facility])   Sepsis (HCC)    UTI   Suprapubic discomfort 06/05/2015   TMJ (temporomandibular joint disorder)    USES  MOUTH GUARD   Toe fracture, left 12/2017   2nd toe prox phalanx (immobilization, Dr. Elner)   Vitamin D deficiency 05/2011   Past Surgical History:  Procedure Laterality Date   ABIs  11/29/2019   NORMAL   ANTERIOR CERVICAL DECOMP/DISCECTOMY FUSION  06/30/2009   C3 -- C5  AND EXPLORATION OF FUSION C5-7 W/  PLATE REMOVAL   ARTHRODESIS METATARSALPHALANGEAL JOINT (MTPJ) Left 07/08/2021   Procedure: Left hallux metatarsophalangeal joint arthrodesis;  Surgeon: Kit Rush, MD;  Location: [facility];  Service: Orthopedics;  Laterality: Left;   BACK SURGERY     BUNIONECTOMY Left    CARDIOVASCULAR STRESS TEST  12/03/2019   myoc perf im: NORMAL   CERVICAL FUSION  2002   anterior C5 -- C7   CERVICAL SPINE SURGERY  08/27/1999     C6 -- T1  LAMINECTOMY/  DISKECTOMY   CESAREAN SECTION  1994   CHOLECYSTECTOMY N/A 03/06/2020   Procedure: LAPAROSCOPIC CHOLECYSTECTOMY;  Surgeon: Lyndel Deward PARAS, MD;  Location: [facility];  Service: General;  Laterality: N/A;   COLONOSCOPY W/ POLYPECTOMY  04/2008;08/2014   2016 tubular adenoma x 1; +diverticulosis and int/ext hemorrhoids.  06/2020 adenoma, recall 5 yrs.   CYSTOSCOPY WITH URETEROSCOPY AND STENT PLACEMENT Left 08/28/2013   Procedure: CYSTOSCOPY, RGP,  WITH URETEROSCOPY AND STENT REMOVAL;  Surgeon: Alm GORMAN Fragmin, MD;  Location: [facility];  Service: Urology;  Laterality: Left;   DEXA  09/2017   T-score -2.2 femur neck: improved compared to 06/2015.  2021 DEXA T score -2.3.  11/2021 DEXA T score -2.2.   ESOPHAGOGASTRODUODENOSCOPY  2010; 08/2014   2016 +Candidal esophagitis; Mild chronic gastritis w/intestinal metaplasia--NEG H pylori, neg for eosinophilic esoph. 06/2020 esoph candidiasis (diflucan  x 20d), o/w  normal.   HAMMER TOE SURGERY Left 07/08/2021   Procedure: Second metatarsal head excision; revision Second hammertoe correction; 2-5 percutaneus flexor tenotomies;  Surgeon: Kit Rush, MD;  Location: [facility];  Service: Orthopedics;  Laterality: Left;   HARDWARE REMOVAL Left 07/08/2021   Procedure: Removal of deep implants from hallux proximal phalanx and First and Second metatarsals;  Surgeon: Kit Rush, MD;  Location:  SURGERY CENTER;  Service: Orthopedics;  Laterality: Left;   HIP PINNING,CANNULATED Right 01/28/2023   Procedure: PERCUTANEOUS FIXATION OF RIGHT FEMORAL NECK;  Surgeon: Sharl Selinda Dover, MD;  Location: [facility];  Service: Orthopedics;  Laterality: Right;   HOLMIUM LASER APPLICATION Left 08/28/2013   Procedure: HOLMIUM LASER APPLICATION;  Surgeon: Alm GORMAN Fragmin, MD;  Location: [facility];  Service:  Urology;  Laterality: Left;   LEFT HEART CATH AND CORONARY ANGIOGRAPHY N/A 05/15/2018   Evidence for spasm in distal LAD.  Mod RCA atherosclerosis, o/w no CAD.  Procedure: LEFT HEART CATH AND CORONARY ANGIOGRAPHY;  Surgeon: Anner Alm ORN, MD;  Location: [facility];  Service: Cardiovascular;  Laterality: N/A;   NEGATIVE SLEEP STUDY  04/01/2007   NEPHROLITHOTOMY Left 08/05/2013   Procedure: NEPHROLITHOTOMY PERCUTANEOUS;  Surgeon: Alm GORMAN Fragmin, MD;  Location: [facility];  Service: Urology;  Laterality: Left;   POLYSOMNOGRAM  01/2019   NORMAL   POLYSOMNOGRAPHY  10/25/2018   Dr. Wilbert Passey due to pt inadequat sleep time (83 min).   TONSILLECTOMY AND ADENOIDECTOMY  1975   TOTAL KNEE ARTHROPLASTY Right 2003   TOTAL KNEE REVISION Right 01/19/2022   Procedure: Right knee polyethylene revision;  Surgeon: Melodi Lerner, MD;  Location: [facility];  Service: Orthopedics;  Laterality: Right;   TRANSTHORACIC ECHOCARDIOGRAM  05/2018; 12/03/2019   (after MI secondary to arterial spasm): EF 45-50%; severe hypokinesis of apical  anterolateral, inferolateral , anterior, and inferior LV myocardium. 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   Healthcare maintenance 04/19/2019   Overweight (BMI 25.0-29.9) 02/19/2019   Ischemic cardiomyopathy 09/03/2018   Coronary vasospasm (HCC) 07/11/2018   NSTEMI (non-ST elevated myocardial infarction) (HCC) 05/14/2018   Hyponatremia 11/16/2017   Bipolar 1 disorder (HCC) 12/29/2016   Hypertension 12/29/2016   History of TIA (transient ischemic attack) 12/29/2016   Chronic pelvic pain in female 08/22/2016   Stage 2 chronic kidney disease 08/22/2016   Osteoporosis 09/30/2015   Health maintenance examination 06/11/2015   Urethral caruncle 06/05/2015   UTI (urinary tract infection) 06/05/2015   TMJ (temporomandibular joint disorder) 02/25/2015   Right ankle injury 11/05/2014   Cervical spondylosis without myelopathy 12/19/2013   Sprain of neck 12/19/2013   Memory difficulties 12/19/2013   Postnasal drip 10/28/2013   Constipation, chronic 07/29/2013   COPD (chronic obstructive pulmonary disease) (HCC) 06/30/2013   Kidney stone on left side 05/29/2013   Hyperlipidemia 05/29/2013     PCP: Phiip McGowen  REFERRING PROVIDER: Phiip McGowen  REFERRING DIAG: R hip pain   THERAPY DIAG:  Pain in right hip  Other abnormalities of gait and mobility  Rationale for Evaluation and Treatment: Rehabilitation  ONSET DATE:  01/28/23   SUBJECTIVE:   SUBJECTIVE STATEMENT: Pt states BP has been fluctuating. She has been more tired. She has not been doing much of her HEP. Hip seems to not be bothering her  as much.    Eval: Pt had fall, suffering R femur fracture on 01/28/23 last year.  She had fixation of femoral neck by Dr. Sharl. States difficulty in hospital and recovering from this, had sepsis, etc. She had difficulty with mobility after this, did not have much PT, had some home heath. States R hip still sore, notes pain in lateral hip with activity and walking.  She also had another fall in April at home, fell hard, broke nose, hurt face, concussion and broke R wrist. Still wearing brace on R hand, but reports wrist doing well at this  time.  She notes difficulty with BP lately, being high and low. States difficulty with motivation lately, and feels like she has no energy- MD aware.  Reports sleeping well at night.  Lives alone, does have life alert and does drive.  States she feels something moving in hip when she turns her leg a certain direction ( S/L Hip er/ir)    PERTINENT HISTORY: CKD, HTN,  TIA,  COPD, Osteoporosis, Bipolar,  R TKA and revision  PAIN:  Are you having pain? Yes: NPRS scale: 4/10 Pain location: R lateral hip Pain description: sore Aggravating factors: unable to state  Relieving factors: unable to state   PRECAUTIONS: Fall  WEIGHT BEARING RESTRICTIONS: No  FALLS:  Has patient fallen in last 6 months? Yes. Number of falls 2 - broke R wrist/outside got out of car,     PLOF: Independent   PATIENT GOALS:  Decreased pain in hip, increased activity level.   NEXT MD VISIT:   OBJECTIVE:   DIAGNOSTIC FINDINGS:   PATIENT SURVEYS:    COGNITION: Overall cognitive status: Within functional limits for tasks assessed     SENSATION: WFL  EDEMA: n/a   POSTURE:    No Significant postural limitations  PALPATION: Tenderness at lateral R hip, and gr trochanter   LOWER EXTREMITY ROM: Hip flex, IR, ER- mild limitations Knees: WFL   LOWER EXTREMITY MMT:  MMT Left eval Right  eval  Hip flexion 4+ 4-  Hip extension    Hip abduction  3/pain  Hip  adduction    Hip internal rotation    Hip external rotation 4+ 4  Knee flexion 5 4+  Knee extension 5 4  Ankle dorsiflexion    Ankle plantarflexion    Ankle inversion    Ankle eversion     (Blank rows = not tested)  LOWER EXTREMITY SPECIAL TESTS:    FUNCTIONAL TESTS:  Will test balance in future sessions- pt with decreased balance/stability, not using AD at this time.   GAIT: Distance walked: 150 Assistive device utilized: None Level of assistance: Complete Independence Comments: R valgus knee, decreased knee extension and heel strike on R.    TODAY'S TREATMENT:                                                                                                                              DATE:   10/31/2023 Therapeutic Exercise: Aerobic: Supine:   SLR 2 x 10 bil ;  bridging x 15;   hip ER GTB x 20;   Seated:  S/L:  Standing:   hip abd and ext 2 x 10 on R ;  Stretches:  Neuromuscular Re-education:  Manual Therapy: Therapeutic Activity:    sit to stand 2 x 5, with Rtb at thighs for R LE position; step ups 2 x 6 on R;    Side stepping  at counter x 10;   Self Care:    Therapeutic Exercise: Aerobic: Supine:  SLR 2 x 10 bil( tried slight Er on R- harder/slight pain)     hip ER GTB x 20;  Seated:  LAQ 3 lb x 20 on R;  S/L: trial hip abd- grinding in hip  Standing:  squats at counter x 12;  at table x 10 ( cueing for hip and knee placement )   Hip abd 3 x 5 on R;   side stepping at counter x 10 Stretches:  Neuromuscular Re-education: Manual Therapy: Therapeutic Activity:    Self Care:   Ther ex:  Supine: SLR 2 x 10 on R;  Hip clams GTB x 15;(sore by end) ;  LAQ x 15 on L;    PATIENT EDUCATION:  Education details: updated and reviewed HEP Person educated: Patient Education method: Explanation, Demonstration, Tactile cues, Verbal cues, and Handouts Education comprehension: verbalized understanding, returned demonstration, verbal cues required, tactile cues  required, and needs further education   HOME EXERCISE PROGRAM: Access Code: [email or web address] URL: [email or web address] Date: 10/13/2023 Prepared by: Tinnie Don Exercises - Straight Leg Raise  - 1 x daily - 2 sets - 5- 10 reps - Seated Knee Extension AROM  - 2 x daily - 2 sets - 10 reps  ASSESSMENT:  CLINICAL IMPRESSION:  Pt with good tolerance for activities. Band at knees today for cueing for decreasing R knee valgus with sit to stand. Improved ability for step up on 4 in step today, with improved strength and pain. She will benefit from continued strength and stability for hip and LE.   Eval: Patient presents with primary complaint of  pain in R hip. She had previous fracture with surgery last year, and continues to have pain and deficit since. She has weakness in R hip and increased pain with movement. She is overall deconditioned and would benefit from increasing mobility. She also has difficulty with balance and has had a few falls with injury. She will benefit from further evaluation of balance at future visits, and treatment of this as needed, to prevent further falls. She has decreased gait mechanics, with valgus R knee and decreased heel strike/short stride on R.  Pt with decreased ability for full functional activities. Pt will  benefit from skilled PT to improve deficits and pain and to return to PLOF.   OBJECTIVE IMPAIRMENTS: Abnormal gait, decreased activity tolerance, decreased coordination, decreased mobility, difficulty walking, decreased ROM, decreased strength, impaired flexibility, impaired vision/preception, improper body mechanics, and pain.   ACTIVITY LIMITATIONS: standing, squatting, stairs, transfers, and locomotion level  PARTICIPATION LIMITATIONS: meal prep, cleaning, laundry, driving, shopping, and community activity  PERSONAL FACTORS: Past/current experiences, Time since onset of injury/illness/exacerbation, and 1 comorbidity: surgery are also  affecting patient's functional outcome.   REHAB POTENTIAL: Good  CLINICAL DECISION MAKING: Evolving/moderate complexity  EVALUATION COMPLEXITY: Moderate   GOALS: Goals reviewed with patient? Yes  SHORT TERM GOALS: Target date: 11/03/2023   Pt to be independent with initial HEP  Goal status: INITIAL  2.  Pt to demo R hip ROM to be pain free in all directions.   Goal status: INITIAL    LONG TERM GOALS: Target date: 12/08/2023   Pt to be independent with final HEP  Goal status: INITIAL  2.  Pt to report decreased pain in R hip to 0-3/10 with activity and walking   Goal status: INITIAL  3.  Pt to demo improved strength of R hip to be at least 4/5 , to improve stability, gait and pain   Goal status: INITIAL  4.  Balance goal TBD  Goal status: INITIAL    PLAN:  PT FREQUENCY: 1-2x/week  PT DURATION: 8 weeks  PLANNED INTERVENTIONS: Therapeutic exercises, Therapeutic activity, Neuromuscular re-education, Patient/Family education, Self Care, Joint mobilization, Joint manipulation, Stair training, Orthotic/Fit training, DME instructions, Aquatic Therapy, Dry Needling, Electrical stimulation, Cryotherapy, Moist heat, Taping, Ultrasound, Ionotophoresis 4mg /ml Dexamethasone , Manual therapy,  Vasopneumatic device, Traction, Spinal manipulation, Spinal mobilization,Balance training, Gait training,   PLAN FOR NEXT SESSION:  hip strength, hip abd strength, Lateral hip pain, side stepping,    Tinnie Don, PT, DPT 2:20 PM  10/31/23

## 2023-11-06 DIAGNOSIS — S52501D Unspecified fracture of the lower end of right radius, subsequent encounter for closed fracture with routine healing: Secondary | ICD-10-CM | POA: Diagnosis not present

## 2023-11-08 ENCOUNTER — Telehealth: Payer: Self-pay

## 2023-11-08 ENCOUNTER — Ambulatory Visit: Payer: Self-pay

## 2023-11-08 DIAGNOSIS — I10 Essential (primary) hypertension: Secondary | ICD-10-CM

## 2023-11-08 DIAGNOSIS — N1831 Chronic kidney disease, stage 3a: Secondary | ICD-10-CM

## 2023-11-08 NOTE — Telephone Encounter (Signed)
See other encounter from 8-6.

## 2023-11-08 NOTE — Addendum Note (Signed)
 Addended by: FLETA CARE D on: 11/08/2023 04:04 PM   Modules accepted: Orders

## 2023-11-08 NOTE — Telephone Encounter (Signed)
 Copied from CRM #[id number]. Topic: Referral - Status >> Nov 08, 2023  1:47 PM Drema MATSU wrote: Reason for CRM: Patient stated that the kidney doctor doesn't have any appointments available and her blood pressure is still high. She wants to know what she should do.  LVM for pt to return call.

## 2023-11-08 NOTE — Telephone Encounter (Signed)
 FYI Only or Action Required?: FYI only for provider.  Patient was last seen in primary care on 10/16/2023 by McGowen, Aleene DEL, MD.  Called Nurse Triage reporting Hypertension.  Symptoms began several months ago.  Interventions attempted: Nothing.  Symptoms are: unchanged.  Triage Disposition: Information or Advice Only Call  Patient/caregiver understands and will follow disposition?: Yes    Reason for Disposition  [1] Follow-up call to recent contact AND [2] information only call, no triage required  Answer Assessment - Initial Assessment Questions 1. REASON FOR CALL: What is the main reason for your call? or How can I best help you?     Pt states she was calling about the referral to the kidney specialitist and states that Brittney was taking care of it. Informed pt that the referral was pending.  2. SYMPTOMS : Do you have any symptoms?      No this has been ongoing for 6 months  3. OTHER QUESTIONS: Do you have any other questions?     no  Protocols used: Information Only Call - No Triage-A-AH

## 2023-11-08 NOTE — Telephone Encounter (Signed)
 First attempt: LVM for patient to return call to [phone]

## 2023-11-08 NOTE — Telephone Encounter (Signed)
 Okay, referral order signed

## 2023-11-08 NOTE — Addendum Note (Signed)
 Addended by: CANDISE ALEENE DEL on: 11/08/2023 04:43 PM   Modules accepted: Orders

## 2023-11-08 NOTE — Telephone Encounter (Signed)
 Pt states she did not receive a call from [facility] and would like to have new referral placed to [facility] location. She provided a fax number [phone] for the location.  Referral pending

## 2023-11-09 NOTE — Telephone Encounter (Signed)
 No further action needed.

## 2023-11-09 NOTE — Telephone Encounter (Signed)
 LVM for pt regarding referral update. PCP completed referral request.

## 2023-11-13 ENCOUNTER — Telehealth: Payer: Self-pay | Admitting: Family Medicine

## 2023-11-13 ENCOUNTER — Encounter: Admitting: Physical Therapy

## 2023-11-13 NOTE — Telephone Encounter (Unsigned)
 Copied from CRM [id number]. Topic: General - Other >> Nov 13, 2023  1:04 PM Deaijah H wrote: Reason for CRM: Patient would like for Tinnie Don give her a call.

## 2023-11-15 ENCOUNTER — Encounter: Admitting: Physical Therapy

## 2023-11-17 DIAGNOSIS — N1831 Chronic kidney disease, stage 3a: Secondary | ICD-10-CM | POA: Diagnosis not present

## 2023-11-20 ENCOUNTER — Ambulatory Visit: Admitting: Physical Therapy

## 2023-11-20 ENCOUNTER — Encounter: Payer: Self-pay | Admitting: Physical Therapy

## 2023-11-20 ENCOUNTER — Other Ambulatory Visit: Payer: Self-pay | Admitting: Family Medicine

## 2023-11-20 DIAGNOSIS — R2689 Other abnormalities of gait and mobility: Secondary | ICD-10-CM

## 2023-11-20 DIAGNOSIS — M25551 Pain in right hip: Secondary | ICD-10-CM

## 2023-11-20 NOTE — Therapy (Signed)
 OUTPATIENT PHYSICAL THERAPY LOWER EXTREMITY TREATMENT   Patient Name: Erin Lopez MRN: [id number] DOB:1950/10/11, 73 y.o., female Today's Date: 11/20/2023  END OF SESSION:  PT End of Session - 11/20/23 1041     Visit Number 5    Number of Visits 16    Date for PT Re-Evaluation 12/08/23    Authorization Type [employer]    PT Start Time 1040    PT Stop Time 1111    PT Time Calculation (min) 31 min    Activity Tolerance Patient tolerated treatment well    Behavior During Therapy [facility] for tasks assessed/performed           Past Medical History:  Diagnosis Date   Acute encephalopathy 11/19/2017   Alcoholism in remission (HCC)    states last alcohol 11 yrs ago   Anemia    Anxiety    Bipolar 1 disorder (HCC)    Admission for mania x 2 (most recent 11/2017)   Cervical spondylosis without myelopathy 12/19/2013   MRI 02/2014: multilevel DDD/spondylosis, not much change compared to prior MRI.  Pt not in favor of invasive therapy for her neck as of 04/2014.   Chronic constipation    Chronic renal insufficiency, stage 3 (moderate) (HCC)    COPD (chronic obstructive pulmonary disease) (HCC)    Moderate: anoro started 04/2017 by pulm, pt symptomatically improved and PFTs stable at f/u 06/2017.   Coronary artery spasm (HCC) 07/11/2018   nitrates=HA. Amlodipine =intol swelling.  Changed to coreg  09/03/18 by cardiologist.   Depression    Endometritis 05/2017   possible; empiric tx with Flagyl  by GYN.   Femoral neck fracture (HCC)    right, 01/28/23-->ORIF   Fibromyalgia    GERD (gastroesophageal reflux disease)    Hepatic steatosis 03/2019   u/s abd   Herpes zoster 08/07/2017   L scapular region   Hiatal hernia    History of adenomatous polyp of colon 04/2008; 08/2014   No high grade dysplasia: recall 5 yrs (Dr. Dianna, [facility])   History of basal cell carcinoma excision    NOSE   History of Helicobacter pylori infection 04/2008   +gastric biopsy (gastritis but no  metaplasia, dysplasia, or malignancy identified)   History of kidney stones    History of TIA (transient ischemic attack)    secondary to HELLP syndrome 1994 post c/s--  residual memory loss   Hyperlipidemia    statin started after her NSTEMI: goal  LDL <70.   Hypertension    Ischemic cardiomyopathy 05/2018   Echo EF 45-50% in context of NSTEMI from CA spasm. 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 Magdalen 04/2017.   Memory loss    Abnl MRI brain and CT brain c/w chronic microvascular ischemia.  Repeat MRI brain 02/2014 stable (Dr. Smitty with no plan to f/u with neuro as of 04/2014)   NSTEMI (non-ST elevated myocardial infarction) (HCC) 05/2018   Echo EF 45-50% in context of NSTEMI from CA spasm.// Myoview  8/21: EF 66, no ischemia, low risk   Osteoarthritis of left wrist    STT and 1st [facility] jt   Osteoporosis 05/2010   2012 'penia; 06/2015 'porosis:  prolia .  DEXA 09/2017 T-score -2.2 femur neck. 11/2021 T score -2.2. Rpt 2 yrs   Pelvic floor dysfunction  [facility] (Dr. Cam)   Postmenopausal vaginal bleeding 05/2017   x 1 small episode; GYN attempted endo bx but unable to penetrate cervix due to severe cerv stenosis (due to postmenopausal state + hx of LEEP).  Endo u/s showed thin endo lining.  Per GYN---no evidence of endomet pathology on w/u---obs as of 07/2017.   Prediabetes 06/2016   Fasting gluc 105; HbA1c at that time was 6.0%.  A1c 6.2% 12/2017.  A1c 5.7% Feb 2023.   Recurrent kidney stones 08/2013   Left ureteral calculus: Perc nephr--cystoscopy w/ureteroscopy + laser for stone removal.  Residual asymptomatic left renal nephrolithiasis <61mm present post-procedure.  Right sided hydronephrosis-persistent (on u/s)--urol ordered CT to  further eval 08/2016: 1.6 cm nonobstructing stone lower pole left kidney, no hydro, plan for PCN extraction ([facility])   Sepsis (HCC)    UTI   Suprapubic discomfort 06/05/2015   TMJ (temporomandibular joint disorder)    USES  MOUTH GUARD   Toe fracture, left 12/2017   2nd toe prox phalanx (immobilization, Dr. Elner)   Vitamin D deficiency 05/2011   Past Surgical History:  Procedure Laterality Date   ABIs  11/29/2019   NORMAL   ANTERIOR CERVICAL DECOMP/DISCECTOMY FUSION  06/30/2009   C3 -- C5  AND EXPLORATION OF FUSION C5-7 W/  PLATE REMOVAL   ARTHRODESIS METATARSALPHALANGEAL JOINT (MTPJ) Left 07/08/2021   Procedure: Left hallux metatarsophalangeal joint arthrodesis;  Surgeon: Kit Rush, MD;  Location: [facility];  Service: Orthopedics;  Laterality: Left;   BACK SURGERY     BUNIONECTOMY Left    CARDIOVASCULAR STRESS TEST  12/03/2019   myoc perf im: NORMAL   CERVICAL FUSION  2002   anterior C5 -- C7   CERVICAL SPINE SURGERY  08/27/1999     C6 -- T1  LAMINECTOMY/  DISKECTOMY   CESAREAN SECTION  1994   CHOLECYSTECTOMY N/A 03/06/2020   Procedure: LAPAROSCOPIC CHOLECYSTECTOMY;  Surgeon: Lyndel Deward PARAS, MD;  Location: [facility];  Service: General;  Laterality: N/A;   COLONOSCOPY W/ POLYPECTOMY  04/2008;08/2014   2016 tubular adenoma x 1; +diverticulosis and int/ext hemorrhoids.  06/2020 adenoma, recall 5 yrs.   CYSTOSCOPY WITH URETEROSCOPY AND STENT PLACEMENT Left 08/28/2013   Procedure: CYSTOSCOPY, RGP,  WITH URETEROSCOPY AND STENT REMOVAL;  Surgeon: Alm GORMAN Fragmin, MD;  Location: [facility];  Service: Urology;  Laterality: Left;   DEXA  09/2017   T-score -2.2 femur neck: improved compared to 06/2015.  2021 DEXA T score -2.3.  11/2021 DEXA T score -2.2.   ESOPHAGOGASTRODUODENOSCOPY  2010; 08/2014   2016 +Candidal esophagitis; Mild chronic gastritis w/intestinal metaplasia--NEG H pylori, neg for eosinophilic esoph. 06/2020 esoph candidiasis (diflucan  x 20d), o/w  normal.   HAMMER TOE SURGERY Left 07/08/2021   Procedure: Second metatarsal head excision; revision Second hammertoe correction; 2-5 percutaneus flexor tenotomies;  Surgeon: Kit Rush, MD;  Location: [facility];  Service: Orthopedics;  Laterality: Left;   HARDWARE REMOVAL Left 07/08/2021   Procedure: Removal of deep implants from hallux proximal phalanx and First and Second metatarsals;  Surgeon: Kit Rush, MD;  Location: [facility];  Service: Orthopedics;  Laterality: Left;   HIP PINNING,CANNULATED Right 01/28/2023   Procedure: PERCUTANEOUS FIXATION OF RIGHT FEMORAL NECK;  Surgeon: Sharl Selinda Dover, MD;  Location: [facility];  Service: Orthopedics;  Laterality: Right;   HOLMIUM LASER APPLICATION Left 08/28/2013   Procedure: HOLMIUM LASER APPLICATION;  Surgeon: Alm GORMAN Fragmin, MD;  Location: [facility];  Service:  Urology;  Laterality: Left;   LEFT HEART CATH AND CORONARY ANGIOGRAPHY N/A 05/15/2018   Evidence for spasm in distal LAD.  Mod RCA atherosclerosis, o/w no CAD.  Procedure: LEFT HEART CATH AND CORONARY ANGIOGRAPHY;  Surgeon: Anner Alm ORN, MD;  Location: [facility];  Service: Cardiovascular;  Laterality: N/A;   NEGATIVE SLEEP STUDY  04/01/2007   NEPHROLITHOTOMY Left 08/05/2013   Procedure: NEPHROLITHOTOMY PERCUTANEOUS;  Surgeon: Alm GORMAN Fragmin, MD;  Location: [facility];  Service: Urology;  Laterality: Left;   POLYSOMNOGRAM  01/2019   NORMAL   POLYSOMNOGRAPHY  10/25/2018   Dr. Wilbert Passey due to pt inadequat sleep time (83 min).   TONSILLECTOMY AND ADENOIDECTOMY  1975   TOTAL KNEE ARTHROPLASTY Right 2003   TOTAL KNEE REVISION Right 01/19/2022   Procedure: Right knee polyethylene revision;  Surgeon: Melodi Lerner, MD;  Location: [facility];  Service: Orthopedics;  Laterality: Right;   TRANSTHORACIC ECHOCARDIOGRAM  05/2018; 12/03/2019   (after MI secondary to arterial spasm): EF 45-50%; severe hypokinesis of apical  anterolateral, inferolateral , anterior, and inferior LV myocardium. 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   Healthcare maintenance 04/19/2019   Overweight (BMI 25.0-29.9) 02/19/2019   Ischemic cardiomyopathy 09/03/2018   Coronary vasospasm (HCC) 07/11/2018   NSTEMI (non-ST elevated myocardial infarction) (HCC) 05/14/2018   Hyponatremia 11/16/2017   Bipolar 1 disorder (HCC) 12/29/2016   Hypertension 12/29/2016   History of TIA (transient ischemic attack) 12/29/2016   Chronic pelvic pain in female 08/22/2016   Stage 2 chronic kidney disease 08/22/2016   Osteoporosis 09/30/2015   Health maintenance examination 06/11/2015   Urethral caruncle 06/05/2015   UTI (urinary tract infection) 06/05/2015   TMJ (temporomandibular joint disorder) 02/25/2015   Right ankle injury 11/05/2014   Cervical spondylosis without myelopathy 12/19/2013   Sprain of neck 12/19/2013   Memory difficulties 12/19/2013   Postnasal drip 10/28/2013   Constipation, chronic 07/29/2013   COPD (chronic obstructive pulmonary disease) (HCC) 06/30/2013   Kidney stone on left side 05/29/2013   Hyperlipidemia 05/29/2013     PCP: Phiip McGowen  REFERRING PROVIDER: Phiip McGowen  REFERRING DIAG: R hip pain   THERAPY DIAG:  Pain in right hip  Other abnormalities of gait and mobility  Rationale for Evaluation and Treatment: Rehabilitation  ONSET DATE:  01/28/23   SUBJECTIVE:   SUBJECTIVE STATEMENT: Pt late to appt today.  Pt states continued pain and difficulty with hip. Leg feels weak, and hip clicks and feels unstable.  Much  pain and difficulty with trying to go up stairs. She is unsure if she should go back to the ortho dr for hip.    Eval: Pt had fall, suffering R femur fracture on 01/28/23 last year.  She had fixation of femoral neck by Dr. Sharl. States difficulty in hospital and recovering from this, had sepsis, etc. She had difficulty with mobility after this, did not have much PT, had some home heath. States R hip still sore, notes pain in lateral hip with activity and walking.  She also had another fall in April at home, fell hard, broke nose, hurt  face, concussion and broke R wrist. Still wearing brace on R hand, but reports wrist doing well at this time.  She notes difficulty with BP lately, being high and low. States difficulty with motivation lately, and feels like she has no energy- MD aware.  Reports sleeping well at night.  Lives alone, does have life alert and does drive.  States she feels something moving in hip when she turns her leg a certain direction ( S/L Hip er/ir)    PERTINENT HISTORY: CKD, HTN,  TIA,  COPD, Osteoporosis, Bipolar,  R TKA and revision  PAIN:  Are you having pain? Yes: NPRS scale: 4/10 Pain location: R lateral hip Pain description: sore Aggravating factors: unable to state  Relieving factors: unable to state   PRECAUTIONS: Fall  WEIGHT BEARING RESTRICTIONS: No  FALLS:  Has patient fallen in last 6 months? Yes. Number of falls 2 - broke R wrist/outside got out of car,     PLOF: Independent   PATIENT GOALS:  Decreased pain in hip, increased activity level.   NEXT MD VISIT:   OBJECTIVE:   DIAGNOSTIC FINDINGS:   PATIENT SURVEYS:    COGNITION: Overall cognitive status: Within functional limits for tasks assessed     SENSATION: WFL  EDEMA: n/a   POSTURE:    No Significant postural limitations  PALPATION: Tenderness at lateral R hip, and gr trochanter   LOWER EXTREMITY ROM: Hip flex, IR, ER- mild limitations Knees: WFL   LOWER EXTREMITY  MMT:  MMT Left eval Right  eval  Hip flexion 4+ 4-  Hip extension    Hip abduction  3/pain  Hip adduction    Hip internal rotation    Hip external rotation 4+ 4  Knee flexion 5 4+  Knee extension 5 4  Ankle dorsiflexion    Ankle plantarflexion    Ankle inversion    Ankle eversion     (Blank rows = not tested)  LOWER EXTREMITY SPECIAL TESTS:    FUNCTIONAL TESTS:  Will test balance in future sessions- pt with decreased balance/stability, not using AD at this time.   GAIT: Distance walked: 150 Assistive device utilized: None Level of assistance: Complete Independence Comments: R valgus knee, decreased knee extension and heel strike on R.    TODAY'S TREATMENT:                                                                                                                              DATE:   11/20/2023 Therapeutic Exercise: Aerobic: Supine:    Seated:    LAQ 3 lb 2 x 10 bil;  S/L:  Standing:   hip abd and ext  2 x 10 bil   Stretches:  Neuromuscular Re-education:  Manual Therapy: Therapeutic Activity:   mini squats at counter x12 with cueing for mechanics.   Side stepping  at counter x 10;   Self Care: discussed current state of hip and referral to ortho for further look,  as well as need to continue strength    Therapeutic Exercise: Aerobic: Supine:   SLR 2 x 10 bil( tried slight Er on R- harder/slight pain)     hip ER GTB x 20;  Seated:  LAQ 3 lb x 20 on R;  S/L: trial hip abd- grinding in hip  Standing:  squats at counter x 12;  at table x 10 ( cueing for hip and knee placement )   Hip abd 3 x 5 on R;   side stepping at counter x 10 Stretches:  Neuromuscular Re-education: Manual Therapy: Therapeutic Activity:    Self Care:   Ther ex:  Supine: SLR 2 x 10 on R;  Hip clams GTB x 15;(sore by end) ;  LAQ x 15 on L;    PATIENT EDUCATION:  Education details: updated and reviewed HEP Person educated: Patient Education method: Explanation, Demonstration,  Tactile cues, Verbal cues, and Handouts Education comprehension: verbalized understanding, returned demonstration, verbal cues required, tactile cues required, and needs further education   HOME EXERCISE PROGRAM: Access Code: [email or web address] URL: [email or web address] Date: 10/13/2023 Prepared by: Tinnie Don Exercises - Straight Leg Raise  - 1 x daily - 2 sets - 5- 10 reps - Seated Knee Extension AROM  - 2 x daily - 2 sets - 10 reps  ASSESSMENT:  CLINICAL IMPRESSION: Discussed referral back to Ortho for closer look at R hip. R hip still with painful crepitus with certain motions. She will benefit from continued strengthening. She has much tendency for flexed knee and trunk posture with standing and gait. Also reviewed ther ex to perform in water , if she can get to the [facility].  Pt to benefit from continued care.   Eval: Patient presents with primary complaint of  pain in R hip. She had previous fracture with surgery last year, and continues to have pain and deficit since. She has weakness in R hip and increased pain with movement. She is overall deconditioned and would benefit from increasing mobility. She also has difficulty with balance and has had a few falls with injury. She will benefit from further evaluation of balance at future visits, and treatment of this as needed, to prevent further falls. She has decreased gait mechanics, with valgus R knee and decreased heel strike/short stride on R.  Pt with decreased ability for full functional activities. Pt will  benefit from skilled PT to improve deficits and pain and to return to PLOF.   OBJECTIVE IMPAIRMENTS: Abnormal gait, decreased activity tolerance, decreased coordination, decreased mobility, difficulty walking, decreased ROM, decreased strength, impaired flexibility, impaired vision/preception, improper body mechanics, and pain.   ACTIVITY LIMITATIONS: standing, squatting, stairs, transfers, and locomotion  level  PARTICIPATION LIMITATIONS: meal prep, cleaning, laundry, driving, shopping, and community activity  PERSONAL FACTORS: Past/current experiences, Time since onset of injury/illness/exacerbation, and 1 comorbidity: surgery are also affecting patient's functional outcome.   REHAB POTENTIAL: Good  CLINICAL DECISION MAKING: Evolving/moderate complexity  EVALUATION COMPLEXITY: Moderate   GOALS: Goals reviewed with patient? Yes  SHORT TERM GOALS: Target date: 11/03/2023   Pt to be independent with initial HEP  Goal status: MET  2.  Pt to demo R hip ROM to be pain free in all directions.   Goal status: in progress    LONG TERM GOALS: Target date: 12/08/2023   Pt to be independent with final HEP  Goal status: INITIAL  2.  Pt to report decreased pain in R hip to 0-3/10 with activity and walking   Goal status: INITIAL  3.  Pt to demo improved strength of R hip to be at least 4/5 , to improve stability, gait and pain   Goal status: INITIAL  4.  Balance goal TBD  Goal status: INITIAL    PLAN:  PT FREQUENCY: 1-2x/week  PT DURATION: 8 weeks  PLANNED INTERVENTIONS: Therapeutic exercises, Therapeutic activity, Neuromuscular re-education, Patient/Family education, Self Care, Joint mobilization, Joint manipulation, Stair training, Orthotic/Fit training, DME instructions, Aquatic Therapy, Dry Needling, Electrical stimulation, Cryotherapy, Moist heat, Taping, Ultrasound, Ionotophoresis 4mg /ml Dexamethasone , Manual therapy,  Vasopneumatic device, Traction, Spinal manipulation, Spinal mobilization,Balance training, Gait training,   PLAN FOR NEXT SESSION:  hip strength, hip abd strength, Lateral hip pain, side stepping,    Tinnie Don, PT, DPT 1:25 PM  11/20/23

## 2023-11-22 ENCOUNTER — Encounter: Admitting: Physical Therapy

## 2023-11-27 ENCOUNTER — Encounter: Payer: Self-pay | Admitting: Physical Therapy

## 2023-11-27 ENCOUNTER — Ambulatory Visit: Admitting: Physical Therapy

## 2023-11-27 DIAGNOSIS — M25551 Pain in right hip: Secondary | ICD-10-CM

## 2023-11-27 DIAGNOSIS — M25561 Pain in right knee: Secondary | ICD-10-CM

## 2023-11-27 DIAGNOSIS — R2689 Other abnormalities of gait and mobility: Secondary | ICD-10-CM

## 2023-11-27 NOTE — Therapy (Signed)
 OUTPATIENT PHYSICAL THERAPY LOWER EXTREMITY TREATMENT   Patient Name: Erin Lopez MRN: [id number] DOB:1950-10-02, 73 y.o., female Today's Date: 11/27/2023  END OF SESSION:  PT End of Session - 11/27/23 1021     Visit Number 6    Number of Visits 16    Date for PT Re-Evaluation 12/08/23    Authorization Type [employer]    PT Start Time 1019    PT Stop Time 1100    PT Time Calculation (min) 41 min    Activity Tolerance Patient tolerated treatment well    Behavior During Therapy [facility] for tasks assessed/performed            Past Medical History:  Diagnosis Date   Acute encephalopathy 11/19/2017   Alcoholism in remission (HCC)    states last alcohol 11 yrs ago   Anemia    Anxiety    Bipolar 1 disorder (HCC)    Admission for mania x 2 (most recent 11/2017)   Cervical spondylosis without myelopathy 12/19/2013   MRI 02/2014: multilevel DDD/spondylosis, not much change compared to prior MRI.  Pt not in favor of invasive therapy for her neck as of 04/2014.   Chronic constipation    Chronic renal insufficiency, stage 3 (moderate) (HCC)    COPD (chronic obstructive pulmonary disease) (HCC)    Moderate: anoro started 04/2017 by pulm, pt symptomatically improved and PFTs stable at f/u 06/2017.   Coronary artery spasm (HCC) 07/11/2018   nitrates=HA. Amlodipine =intol swelling.  Changed to coreg  09/03/18 by cardiologist.   Depression    Endometritis 05/2017   possible; empiric tx with Flagyl  by GYN.   Femoral neck fracture (HCC)    right, 01/28/23-->ORIF   Fibromyalgia    GERD (gastroesophageal reflux disease)    Hepatic steatosis 03/2019   u/s abd   Herpes zoster 08/07/2017   L scapular region   Hiatal hernia    History of adenomatous polyp of colon 04/2008; 08/2014   No high grade dysplasia: recall 5 yrs (Dr. Dianna, [facility])   History of basal cell carcinoma excision    NOSE   History of Helicobacter pylori infection 04/2008   +gastric biopsy (gastritis but no  metaplasia, dysplasia, or malignancy identified)   History of kidney stones    History of TIA (transient ischemic attack)    secondary to HELLP syndrome 1994 post c/s--  residual memory loss   Hyperlipidemia    statin started after her NSTEMI: goal  LDL <70.   Hypertension    Ischemic cardiomyopathy 05/2018   Echo EF 45-50% in context of NSTEMI from CA spasm. 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 Magdalen 04/2017.   Memory loss    Abnl MRI brain and CT brain c/w chronic microvascular ischemia.  Repeat MRI brain 02/2014 stable (Dr. Smitty with no plan to f/u with neuro as of 04/2014)   NSTEMI (non-ST elevated myocardial infarction) (HCC) 05/2018   Echo EF 45-50% in context of NSTEMI from CA spasm.// Myoview  8/21: EF 66, no ischemia, low risk   Osteoarthritis of left wrist    STT and 1st [facility] jt   Osteoporosis 05/2010   2012 'penia; 06/2015 'porosis:  prolia .  DEXA 09/2017 T-score -2.2 femur neck. 11/2021 T score -2.2. Rpt 2 yrs   Pelvic floor dysfunction  [facility] (Dr. Cam)   Postmenopausal vaginal bleeding 05/2017   x 1 small episode; GYN attempted endo bx but unable to penetrate cervix due to severe cerv stenosis (due to postmenopausal state + hx of LEEP).  Endo u/s showed thin endo lining.  Per GYN---no evidence of endomet pathology on w/u---obs as of 07/2017.   Prediabetes 06/2016   Fasting gluc 105; HbA1c at that time was 6.0%.  A1c 6.2% 12/2017.  A1c 5.7% Feb 2023.   Recurrent kidney stones 08/2013   Left ureteral calculus: Perc nephr--cystoscopy w/ureteroscopy + laser for stone removal.  Residual asymptomatic left renal nephrolithiasis <56mm present post-procedure.  Right sided hydronephrosis-persistent (on u/s)--urol ordered CT to  further eval 08/2016: 1.6 cm nonobstructing stone lower pole left kidney, no hydro, plan for PCN extraction ([facility])   Sepsis (HCC)    UTI   Suprapubic discomfort 06/05/2015   TMJ (temporomandibular joint disorder)    USES  MOUTH GUARD   Toe fracture, left 12/2017   2nd toe prox phalanx (immobilization, Dr. Elner)   Vitamin D deficiency 05/2011   Past Surgical History:  Procedure Laterality Date   ABIs  11/29/2019   NORMAL   ANTERIOR CERVICAL DECOMP/DISCECTOMY FUSION  06/30/2009   C3 -- C5  AND EXPLORATION OF FUSION C5-7 W/  PLATE REMOVAL   ARTHRODESIS METATARSALPHALANGEAL JOINT (MTPJ) Left 07/08/2021   Procedure: Left hallux metatarsophalangeal joint arthrodesis;  Surgeon: Kit Rush, MD;  Location: [facility];  Service: Orthopedics;  Laterality: Left;   BACK SURGERY     BUNIONECTOMY Left    CARDIOVASCULAR STRESS TEST  12/03/2019   myoc perf im: NORMAL   CERVICAL FUSION  2002   anterior C5 -- C7   CERVICAL SPINE SURGERY  08/27/1999     C6 -- T1  LAMINECTOMY/  DISKECTOMY   CESAREAN SECTION  1994   CHOLECYSTECTOMY N/A 03/06/2020   Procedure: LAPAROSCOPIC CHOLECYSTECTOMY;  Surgeon: Lyndel Deward PARAS, MD;  Location: [facility] OR;  Service: General;  Laterality: N/A;   COLONOSCOPY W/ POLYPECTOMY  04/2008;08/2014   2016 tubular adenoma x 1; +diverticulosis and int/ext hemorrhoids.  06/2020 adenoma, recall 5 yrs.   CYSTOSCOPY WITH URETEROSCOPY AND STENT PLACEMENT Left 08/28/2013   Procedure: CYSTOSCOPY, RGP,  WITH URETEROSCOPY AND STENT REMOVAL;  Surgeon: Alm GORMAN Fragmin, MD;  Location: [facility];  Service: Urology;  Laterality: Left;   DEXA  09/2017   T-score -2.2 femur neck: improved compared to 06/2015.  2021 DEXA T score -2.3.  11/2021 DEXA T score -2.2.   ESOPHAGOGASTRODUODENOSCOPY  2010; 08/2014   2016 +Candidal esophagitis; Mild chronic gastritis w/intestinal metaplasia--NEG H pylori, neg for eosinophilic esoph. 06/2020 esoph candidiasis (diflucan  x 20d), o/w  normal.   HAMMER TOE SURGERY Left 07/08/2021   Procedure: Second metatarsal head excision; revision Second hammertoe correction; 2-5 percutaneus flexor tenotomies;  Surgeon: Kit Rush, MD;  Location: [facility];  Service: Orthopedics;  Laterality: Left;   HARDWARE REMOVAL Left 07/08/2021   Procedure: Removal of deep implants from hallux proximal phalanx and First and Second metatarsals;  Surgeon: Kit Rush, MD;  Location: [facility];  Service: Orthopedics;  Laterality: Left;   HIP PINNING,CANNULATED Right 01/28/2023   Procedure: PERCUTANEOUS FIXATION OF RIGHT FEMORAL NECK;  Surgeon: Sharl Selinda Dover, MD;  Location: [facility] OR;  Service: Orthopedics;  Laterality: Right;   HOLMIUM LASER APPLICATION Left 08/28/2013   Procedure: HOLMIUM LASER APPLICATION;  Surgeon: Alm GORMAN Fragmin, MD;  Location: [facility];  Service:  Urology;  Laterality: Left;   LEFT HEART CATH AND CORONARY ANGIOGRAPHY N/A 05/15/2018   Evidence for spasm in distal LAD.  Mod RCA atherosclerosis, o/w no CAD.  Procedure: LEFT HEART CATH AND CORONARY ANGIOGRAPHY;  Surgeon: Anner Alm ORN, MD;  Location: [facility] INVASIVE CV LAB;  Service: Cardiovascular;  Laterality: N/A;   NEGATIVE SLEEP STUDY  04/01/2007   NEPHROLITHOTOMY Left 08/05/2013   Procedure: NEPHROLITHOTOMY PERCUTANEOUS;  Surgeon: Alm GORMAN Fragmin, MD;  Location: [facility];  Service: Urology;  Laterality: Left;   POLYSOMNOGRAM  01/2019   NORMAL   POLYSOMNOGRAPHY  10/25/2018   Dr. Wilbert Passey due to pt inadequat sleep time (83 min).   TONSILLECTOMY AND ADENOIDECTOMY  1975   TOTAL KNEE ARTHROPLASTY Right 2003   TOTAL KNEE REVISION Right 01/19/2022   Procedure: Right knee polyethylene revision;  Surgeon: Melodi Lerner, MD;  Location: [facility];  Service: Orthopedics;  Laterality: Right;   TRANSTHORACIC ECHOCARDIOGRAM  05/2018; 12/03/2019   (after MI secondary to arterial spasm): EF 45-50%; severe hypokinesis of apical  anterolateral, inferolateral , anterior, and inferior LV myocardium. 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   Healthcare maintenance 04/19/2019   Overweight (BMI 25.0-29.9) 02/19/2019   Ischemic cardiomyopathy 09/03/2018   Coronary vasospasm (HCC) 07/11/2018   NSTEMI (non-ST elevated myocardial infarction) (HCC) 05/14/2018   Hyponatremia 11/16/2017   Bipolar 1 disorder (HCC) 12/29/2016   Hypertension 12/29/2016   History of TIA (transient ischemic attack) 12/29/2016   Chronic pelvic pain in female 08/22/2016   Stage 2 chronic kidney disease 08/22/2016   Osteoporosis 09/30/2015   Health maintenance examination 06/11/2015   Urethral caruncle 06/05/2015   UTI (urinary tract infection) 06/05/2015   TMJ (temporomandibular joint disorder) 02/25/2015   Right ankle injury 11/05/2014   Cervical spondylosis without myelopathy 12/19/2013   Sprain of neck 12/19/2013   Memory difficulties 12/19/2013   Postnasal drip 10/28/2013   Constipation, chronic 07/29/2013   COPD (chronic obstructive pulmonary disease) (HCC) 06/30/2013   Kidney stone on left side 05/29/2013   Hyperlipidemia 05/29/2013     PCP: Phiip McGowen  REFERRING PROVIDER: Phiip McGowen  REFERRING DIAG: R hip pain   THERAPY DIAG:  Pain in right hip  Other abnormalities of gait and mobility  Acute pain of right knee  Rationale for Evaluation and Treatment: Rehabilitation  ONSET DATE:  01/28/23   SUBJECTIVE:   SUBJECTIVE STATEMENT: Pt states feeling fatigued that is bothersome to her. Feels R hip doing better this week, less pain and  less clicking.    Eval: Pt had fall, suffering R femur fracture on 01/28/23 last year.  She had fixation of femoral neck by Dr. Sharl. States difficulty in hospital and recovering from this, had sepsis, etc. She had difficulty with mobility after this, did not have much PT, had some home heath. States R hip still sore, notes pain in lateral hip with activity and walking.  She also had another fall in April at home, fell hard, broke nose, hurt face, concussion and broke R wrist. Still wearing brace on R hand, but reports wrist doing well at this time.  She notes difficulty with BP lately, being high and low. States difficulty with motivation lately, and feels like she has no energy- MD aware.  Reports sleeping well at night.  Lives alone, does have life alert and does drive.  States she feels something moving in hip when she turns her leg a certain direction ( S/L Hip er/ir)    PERTINENT HISTORY: CKD, HTN,  TIA,  COPD, Osteoporosis, Bipolar,  R TKA and revision  PAIN:  Are you having pain? Yes: NPRS scale: 4/10 Pain location: R lateral hip Pain description: sore Aggravating factors: unable to state  Relieving factors: unable to state   PRECAUTIONS: Fall  WEIGHT BEARING RESTRICTIONS: No  FALLS:  Has patient fallen in last 6 months? Yes. Number of falls 2 - broke R wrist/outside got out of car,     PLOF: Independent   PATIENT GOALS:  Decreased pain in hip, increased activity level.   NEXT MD VISIT:   OBJECTIVE:   DIAGNOSTIC FINDINGS:   PATIENT SURVEYS:    COGNITION: Overall cognitive status: Within functional limits for tasks assessed     SENSATION: WFL  EDEMA: n/a   POSTURE:    No Significant postural limitations  PALPATION: Tenderness at lateral R hip, and gr trochanter   LOWER EXTREMITY ROM: Hip flex, IR, ER- mild limitations Knees: WFL   LOWER EXTREMITY MMT:  MMT Left eval Right  eval 11/27/2023 Right  Hip flexion 4+ 4- 4   Hip extension     Hip  abduction  3/pain 4-  Hip adduction     Hip internal rotation     Hip external rotation 4+ 4 4  Knee flexion 5 4+   Knee extension 5 4   Ankle dorsiflexion     Ankle plantarflexion     Ankle inversion     Ankle eversion      (Blank rows = not tested)   LOWER EXTREMITY SPECIAL TESTS:    FUNCTIONAL TESTS:  Will test balance in future sessions- pt with decreased balance/stability, not using AD at this time.   GAIT: Distance walked: 150 Assistive device utilized: None Level of assistance: Complete Independence Comments: R valgus knee, decreased knee extension and heel strike on R.    TODAY'S TREATMENT:                                                                                                                              DATE:   11/27/2023 Therapeutic Exercise: Aerobic: Supine:   SLR 2 x 10 bil ;   Bridging x 15 ;  Seated:    LAQ 3 lb  3 x 10 bil;  S/L:  trial hip abd and clam- grinding.  Standing:   hip abd 2 x 10 bil   Stretches:  Neuromuscular Re-education:  Manual Therapy: Therapeutic Activity:  mini squats at counter  2 x 10 with cueing for mechanics.  Side stepping  at counter 2 x 10;  Sit to stand high mat table 2 x 10;  Marching x 20;  Self Care:     Therapeutic Exercise: Aerobic: Supine:   SLR 2 x 10 bil( tried slight Er on R- harder/slight pain)     hip ER GTB x 20;  Seated:  LAQ 3 lb x 20 on R;  S/L: trial hip abd- grinding in hip  Standing:  squats at counter x 12;  at table x 10 ( cueing for hip and knee placement )   Hip abd 3 x 5 on R;   side stepping at counter x 10 Stretches:  Neuromuscular Re-education: Manual Therapy: Therapeutic Activity:    Self Care:   Ther ex:  Supine: SLR 2 x 10 on R;  Hip clams GTB x 15;(sore by end) ;  LAQ x 15 on L;    PATIENT EDUCATION:  Education details: updated and reviewed HEP Person educated: Patient Education method: Explanation, Demonstration, Tactile cues, Verbal cues, and Handouts Education  comprehension: verbalized understanding, returned demonstration, verbal cues required, tactile cues required, and needs further education   HOME EXERCISE PROGRAM: Access Code: [email or web address] URL: [email or web address] Date: 10/13/2023 Prepared by: Tinnie Don Exercises - Straight Leg Raise  - 1 x daily - 2 sets - 5- 10 reps - Seated Knee Extension AROM  - 2 x daily - 2 sets - 10 reps  ASSESSMENT:  CLINICAL IMPRESSION: Pt with improving pain this week. Continued recommendations for increased and more frequent mobility at home.  Side lying abd much stronger to test today from previous weeks. She is still getting audible crepitus with abd and clam motion in s/l. Clams better in supine and abd better in standing. Will continue to benefit from progressive strengthening.    Discussed referral back to Ortho for closer look at R hip. R hip still with painful crepitus with certain motions. She will benefit from continued strengthening. She has much tendency for flexed knee and trunk posture with standing and gait. Also reviewed ther ex to perform in water , if she can get to the [facility].  Pt to benefit from continued care.   Eval: Patient presents with primary complaint of  pain in R hip. She had previous fracture with surgery last year, and continues to have pain and deficit since. She has weakness in R hip and increased pain with movement. She is overall deconditioned and would benefit from increasing mobility. She also has difficulty with balance and has had a few falls with injury. She will benefit from further evaluation of balance at future visits, and treatment of this as needed, to prevent further falls. She has decreased gait mechanics, with valgus R knee and decreased heel strike/short stride on R.  Pt with decreased ability for full functional activities. Pt will  benefit from skilled PT to improve deficits and pain and to return to PLOF.   OBJECTIVE IMPAIRMENTS: Abnormal gait,  decreased activity tolerance, decreased coordination, decreased mobility, difficulty walking, decreased ROM, decreased strength, impaired flexibility, impaired vision/preception, improper body mechanics, and pain.   ACTIVITY LIMITATIONS: standing, squatting, stairs, transfers, and locomotion level  PARTICIPATION LIMITATIONS: meal prep, cleaning, laundry, driving, shopping, and community activity  PERSONAL FACTORS: Past/current experiences, Time since onset of injury/illness/exacerbation, and 1 comorbidity: surgery are also affecting patient's functional outcome.   REHAB POTENTIAL: Good  CLINICAL DECISION MAKING: Evolving/moderate complexity  EVALUATION COMPLEXITY: Moderate   GOALS: Goals reviewed with patient? Yes  SHORT TERM GOALS: Target date: 11/03/2023   Pt to be independent with initial HEP  Goal status: MET  2.  Pt to demo R hip ROM to be pain free in all directions.   Goal status: in progress    LONG TERM GOALS: Target date: 12/08/2023   Pt to be independent with final HEP  Goal status: INITIAL  2.  Pt to report decreased pain in R hip to 0-3/10 with activity and walking   Goal status: INITIAL  3.  Pt to demo improved strength of R hip to be at least 4/5 , to improve stability, gait and pain   Goal status: INITIAL  4.  Balance goal TBD  Goal status: INITIAL    PLAN:  PT FREQUENCY: 1-2x/week  PT DURATION: 8 weeks  PLANNED INTERVENTIONS: Therapeutic exercises, Therapeutic activity, Neuromuscular re-education, Patient/Family education, Self Care, Joint mobilization, Joint manipulation, Stair training, Orthotic/Fit training, DME instructions, Aquatic Therapy, Dry Needling, Electrical stimulation, Cryotherapy, Moist heat, Taping, Ultrasound, Ionotophoresis 4mg /ml Dexamethasone , Manual therapy,  Vasopneumatic device, Traction, Spinal manipulation, Spinal mobilization,Balance training, Gait training,   PLAN FOR NEXT SESSION:  hip strength, hip abd strength,   step ups, hip ER Clams   Tinnie Don, PT, DPT 10:52 AM  11/27/23

## 2023-11-29 ENCOUNTER — Ambulatory Visit: Admitting: Physical Therapy

## 2023-11-29 ENCOUNTER — Encounter: Payer: Self-pay | Admitting: Physical Therapy

## 2023-11-29 DIAGNOSIS — R2689 Other abnormalities of gait and mobility: Secondary | ICD-10-CM

## 2023-11-29 DIAGNOSIS — M25551 Pain in right hip: Secondary | ICD-10-CM

## 2023-11-29 NOTE — Therapy (Signed)
 OUTPATIENT PHYSICAL THERAPY LOWER EXTREMITY TREATMENT   Patient Name: Erin Lopez MRN: [id number] DOB:1951/01/18, 73 y.o., female Today's Date: 11/29/2023  END OF SESSION:  PT End of Session - 11/29/23 1028     Visit Number 7    Number of Visits 16    Date for PT Re-Evaluation 12/08/23    Authorization Type [employer]    PT Start Time 1020    PT Stop Time 1100    PT Time Calculation (min) 40 min    Activity Tolerance Patient tolerated treatment well    Behavior During Therapy [facility] for tasks assessed/performed             Past Medical History:  Diagnosis Date   Acute encephalopathy 11/19/2017   Alcoholism in remission (HCC)    states last alcohol 11 yrs ago   Anemia    Anxiety    Bipolar 1 disorder (HCC)    Admission for mania x 2 (most recent 11/2017)   Cervical spondylosis without myelopathy 12/19/2013   MRI 02/2014: multilevel DDD/spondylosis, not much change compared to prior MRI.  Pt not in favor of invasive therapy for her neck as of 04/2014.   Chronic constipation    Chronic renal insufficiency, stage 3 (moderate) (HCC)    COPD (chronic obstructive pulmonary disease) (HCC)    Moderate: anoro started 04/2017 by pulm, pt symptomatically improved and PFTs stable at f/u 06/2017.   Coronary artery spasm (HCC) 07/11/2018   nitrates=HA. Amlodipine =intol swelling.  Changed to coreg  09/03/18 by cardiologist.   Depression    Endometritis 05/2017   possible; empiric tx with Flagyl  by GYN.   Femoral neck fracture (HCC)    right, 01/28/23-->ORIF   Fibromyalgia    GERD (gastroesophageal reflux disease)    Hepatic steatosis 03/2019   u/s abd   Herpes zoster 08/07/2017   L scapular region   Hiatal hernia    History of adenomatous polyp of colon 04/2008; 08/2014   No high grade dysplasia: recall 5 yrs (Dr. Dianna, [facility])   History of basal cell carcinoma excision    NOSE   History of Helicobacter pylori infection 04/2008   +gastric biopsy (gastritis but no  metaplasia, dysplasia, or malignancy identified)   History of kidney stones    History of TIA (transient ischemic attack)    secondary to HELLP syndrome 1994 post c/s--  residual memory loss   Hyperlipidemia    statin started after her NSTEMI: goal  LDL <70.   Hypertension    Ischemic cardiomyopathy 05/2018   Echo EF 45-50% in context of NSTEMI from CA spasm. 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 Magdalen 04/2017.   Memory loss    Abnl MRI brain and CT brain c/w chronic microvascular ischemia.  Repeat MRI brain 02/2014 stable (Dr. Smitty with no plan to f/u with neuro as of 04/2014)   NSTEMI (non-ST elevated myocardial infarction) (HCC) 05/2018   Echo EF 45-50% in context of NSTEMI from CA spasm.// Myoview  8/21: EF 66, no ischemia, low risk   Osteoarthritis of left wrist    STT and [facility] jt   Osteoporosis 05/2010   2012 'penia; 06/2015 'porosis:  prolia .  DEXA 09/2017 T-score -2.2 femur neck. 11/2021 T score -2.2. Rpt 2 yrs   Pelvic floor  dysfunction    [facility] (Dr. Cam)   Postmenopausal vaginal bleeding 05/2017   x 1 small episode; GYN attempted endo bx but unable to penetrate cervix due to severe cerv stenosis (due to postmenopausal state + hx of LEEP).  Endo u/s showed thin endo lining.  Per GYN---no evidence of endomet pathology on w/u---obs as of 07/2017.   Prediabetes 06/2016   Fasting gluc 105; HbA1c at that time was 6.0%.  A1c 6.2% 12/2017.  A1c 5.7% Feb 2023.   Recurrent kidney stones 08/2013   Left ureteral calculus: Perc nephr--cystoscopy w/ureteroscopy + laser for stone removal.  Residual asymptomatic left renal nephrolithiasis <37mm present post-procedure.  Right sided hydronephrosis-persistent (on u/s)--urol ordered CT to  further eval 08/2016: 1.6 cm nonobstructing stone lower pole left kidney, no hydro, plan for PCN extraction ([facility])   Sepsis (HCC)    UTI   Suprapubic discomfort 06/05/2015   TMJ (temporomandibular joint disorder)    USES  MOUTH GUARD   Toe fracture, left 12/2017   2nd toe prox phalanx (immobilization, Dr. Elner)   Vitamin D deficiency 05/2011   Past Surgical History:  Procedure Laterality Date   ABIs  11/29/2019   NORMAL   ANTERIOR CERVICAL DECOMP/DISCECTOMY FUSION  06/30/2009   C3 -- C5  AND EXPLORATION OF FUSION C5-7 W/  PLATE REMOVAL   ARTHRODESIS METATARSALPHALANGEAL JOINT (MTPJ) Left 07/08/2021   Procedure: Left hallux metatarsophalangeal joint arthrodesis;  Surgeon: Kit Rush, MD;  Location: [facility];  Service: Orthopedics;  Laterality: Left;   BACK SURGERY     BUNIONECTOMY Left    CARDIOVASCULAR STRESS TEST  12/03/2019   myoc perf im: NORMAL   CERVICAL FUSION  2002   anterior C5 -- C7   CERVICAL SPINE SURGERY  08/27/1999     C6 -- T1  LAMINECTOMY/  DISKECTOMY   CESAREAN SECTION  1994   CHOLECYSTECTOMY N/A 03/06/2020   Procedure: LAPAROSCOPIC CHOLECYSTECTOMY;  Surgeon: Lyndel Deward PARAS, MD;  Location: [facility];  Service: General;  Laterality: N/A;   COLONOSCOPY W/ POLYPECTOMY  04/2008;08/2014   2016 tubular adenoma x 1; +diverticulosis and int/ext hemorrhoids.  06/2020 adenoma, recall 5 yrs.   CYSTOSCOPY WITH URETEROSCOPY AND STENT PLACEMENT Left 08/28/2013   Procedure: CYSTOSCOPY, RGP,  WITH URETEROSCOPY AND STENT REMOVAL;  Surgeon: Alm GORMAN Fragmin, MD;  Location: [facility];  Service: Urology;  Laterality: Left;   DEXA  09/2017   T-score -2.2 femur neck: improved compared to 06/2015.  2021 DEXA T score -2.3.  11/2021 DEXA T score -2.2.   ESOPHAGOGASTRODUODENOSCOPY  2010; 08/2014   2016 +Candidal esophagitis; Mild chronic gastritis w/intestinal metaplasia--NEG H pylori, neg for eosinophilic esoph. 06/2020 esoph candidiasis (diflucan  x 20d), o/w  normal.   HAMMER TOE SURGERY Left 07/08/2021   Procedure: Second metatarsal head excision; revision Second hammertoe correction; 2-5 percutaneus flexor tenotomies;  Surgeon: Kit Rush, MD;  Location: [facility];  Service: Orthopedics;  Laterality: Left;   HARDWARE REMOVAL Left 07/08/2021   Procedure: Removal of deep implants from hallux proximal phalanx and First and Second metatarsals;  Surgeon: Kit Rush, MD;  Location: [facility];  Service: Orthopedics;  Laterality: Left;   HIP PINNING,CANNULATED Right 01/28/2023   Procedure: PERCUTANEOUS FIXATION OF RIGHT FEMORAL NECK;  Surgeon: Sharl Selinda Dover, MD;  Location: [facility];  Service: Orthopedics;  Laterality: Right;   HOLMIUM LASER APPLICATION Left 08/28/2013   Procedure: HOLMIUM LASER APPLICATION;  Surgeon: Alm GORMAN Fragmin, MD;  Location: [facility];  Service: Urology;  Laterality: Left;   LEFT HEART CATH AND CORONARY ANGIOGRAPHY N/A 05/15/2018   Evidence for spasm in distal LAD.  Mod RCA atherosclerosis, o/w no CAD.  Procedure: LEFT HEART CATH AND CORONARY ANGIOGRAPHY;  Surgeon: Anner Alm ORN, MD;  Location: [facility];  Service: Cardiovascular;  Laterality: N/A;   NEGATIVE SLEEP STUDY  04/01/2007   NEPHROLITHOTOMY Left 08/05/2013   Procedure: NEPHROLITHOTOMY PERCUTANEOUS;  Surgeon: Alm GORMAN Fragmin, MD;  Location: [facility];  Service: Urology;  Laterality: Left;   POLYSOMNOGRAM  01/2019   NORMAL   POLYSOMNOGRAPHY  10/25/2018   Dr. Wilbert Passey due to pt inadequat sleep time (83 min).   TONSILLECTOMY AND ADENOIDECTOMY  1975   TOTAL KNEE ARTHROPLASTY Right 2003   TOTAL KNEE REVISION Right 01/19/2022   Procedure: Right knee polyethylene revision;  Surgeon: Melodi Lerner, MD;  Location: [facility];  Service: Orthopedics;  Laterality: Right;   TRANSTHORACIC ECHOCARDIOGRAM  05/2018; 12/03/2019   (after MI secondary to arterial spasm): EF 45-50%; severe hypokinesis of apical  anterolateral, inferolateral , anterior, and inferior LV myocardium. 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   Healthcare maintenance 04/19/2019   Overweight (BMI 25.0-29.9) 02/19/2019   Ischemic cardiomyopathy 09/03/2018   Coronary vasospasm (HCC) 07/11/2018   NSTEMI (non-ST elevated myocardial infarction) (HCC) 05/14/2018   Hyponatremia 11/16/2017   Bipolar 1 disorder (HCC) 12/29/2016   Hypertension 12/29/2016   History of TIA (transient ischemic attack) 12/29/2016   Chronic pelvic pain in female 08/22/2016   Stage 2 chronic kidney disease 08/22/2016   Osteoporosis 09/30/2015   Health maintenance examination 06/11/2015   Urethral caruncle 06/05/2015   UTI (urinary tract infection) 06/05/2015   TMJ (temporomandibular joint disorder) 02/25/2015   Right ankle injury 11/05/2014   Cervical spondylosis without myelopathy 12/19/2013   Sprain of neck 12/19/2013   Memory difficulties 12/19/2013   Postnasal drip 10/28/2013   Constipation, chronic 07/29/2013   COPD (chronic obstructive pulmonary disease) (HCC) 06/30/2013   Kidney stone on left side 05/29/2013   Hyperlipidemia 05/29/2013     PCP: Phiip McGowen  REFERRING PROVIDER: Phiip McGowen  REFERRING DIAG: R hip pain   THERAPY DIAG:  Pain in right hip  Other abnormalities of gait and mobility  Rationale for Evaluation and Treatment: Rehabilitation  ONSET DATE:  01/28/23   SUBJECTIVE:   SUBJECTIVE STATEMENT: Pt  with no new complaints today.    Eval: Pt had fall, suffering R femur fracture on 01/28/23 last year.  She had fixation of  femoral neck by Dr. Sharl. States difficulty in hospital and recovering from this, had sepsis, etc. She had difficulty with mobility after this, did not have much PT, had some home heath. States R hip still sore, notes pain in lateral hip with activity and walking.  She also had another fall in April at home, fell hard, broke nose, hurt face, concussion and broke R wrist. Still wearing brace on R hand, but reports wrist doing well at this time.  She notes difficulty with BP lately, being high and low. States difficulty with motivation lately, and  feels like she has no energy- MD aware.  Reports sleeping well at night.  Lives alone, does have life alert and does drive.  States she feels something moving in hip when she turns her leg a certain direction ( S/L Hip er/ir)    PERTINENT HISTORY: CKD, HTN,  TIA,  COPD, Osteoporosis, Bipolar,  R TKA and revision  PAIN:  Are you having pain? Yes: NPRS scale: 4/10 Pain location: R lateral hip Pain description: sore Aggravating factors: unable to state  Relieving factors: unable to state   PRECAUTIONS: Fall  WEIGHT BEARING RESTRICTIONS: No  FALLS:  Has patient fallen in last 6 months? Yes. Number of falls 2 - broke R wrist/outside got out of car,     PLOF: Independent   PATIENT GOALS:  Decreased pain in hip, increased activity level.   NEXT MD VISIT:   OBJECTIVE:   DIAGNOSTIC FINDINGS:   PATIENT SURVEYS:    COGNITION: Overall cognitive status: Within functional limits for tasks assessed     SENSATION: WFL  EDEMA: n/a   POSTURE:    No Significant postural limitations  PALPATION: Tenderness at lateral R hip, and gr trochanter   LOWER EXTREMITY ROM: Hip flex, IR, ER- mild limitations Knees: WFL   LOWER EXTREMITY MMT:  MMT Left eval Right  eval 11/27/2023 Right  Hip flexion 4+ 4- 4   Hip extension     Hip abduction  3/pain 4-  Hip adduction     Hip internal rotation     Hip external rotation 4+ 4 4  Knee flexion  5 4+   Knee extension 5 4   Ankle dorsiflexion     Ankle plantarflexion     Ankle inversion     Ankle eversion      (Blank rows = not tested)   LOWER EXTREMITY SPECIAL TESTS:    FUNCTIONAL TESTS:  Will test balance in future sessions- pt with decreased balance/stability, not using AD at this time.   GAIT: Distance walked: 150 Assistive device utilized: None Level of assistance: Complete Independence Comments: R valgus knee, decreased knee extension and heel strike on R.    TODAY'S TREATMENT:                                                                                                                              DATE:   11/29/2023 Therapeutic Exercise: Aerobic: Supine:     Bridging with GTB at thighs x 15 ;  Clams GTB x 15; Supine march with GTB x 15;  Seated:    LAQ 3 lb  3 x 10 bil;  S/L:   Standing:   hip abd 2 x 10 bil   Stretches:  Neuromuscular Re-education:  Manual Therapy: Therapeutic Activity:   mini squats at counter  2 x 10 with cueing for mechanics.  Side stepping  at counter 3 x 10;   Sit to stand high mat table 2 x 10;  Marching x  20;  Bwd walking in hallway x 4  Self Care:     Therapeutic Exercise: Aerobic: Supine:   SLR 2 x 10 bil( tried slight Er on R- harder/slight pain)     hip ER GTB x 20;  Seated:  LAQ 3 lb x 20 on R;  S/L: trial hip abd- grinding in hip  Standing:  squats at counter x 12;  at table x 10 ( cueing for hip and knee placement )   Hip abd 3 x 5 on R;   side stepping at counter x 10 Stretches:  Neuromuscular Re-education: Manual Therapy: Therapeutic Activity:    Self Care:   Ther ex:  Supine: SLR 2 x 10 on R;  Hip clams GTB x 15;(sore by end) ;  LAQ x 15 on L;    PATIENT EDUCATION:  Education details: updated and reviewed HEP Person educated: Patient Education method: Explanation, Demonstration, Tactile cues, Verbal cues, and Handouts Education comprehension: verbalized understanding, returned demonstration, verbal  cues required, tactile cues required, and needs further education   HOME EXERCISE PROGRAM: Access Code: [email or web address] URL: [email or web address] Date: 10/13/2023 Prepared by: Tinnie Don Exercises - Straight Leg Raise  - 1 x daily - 2 sets - 5- 10 reps - Seated Knee Extension AROM  - 2 x daily - 2 sets - 10 reps  ASSESSMENT:  CLINICAL IMPRESSION: Pt with improving tolerance for activity, and mild improvements in strength of leg. She will benefit from continued strengthening for hip and LE.    Discussed referral back to Ortho for closer look at R hip. R hip still with painful crepitus with certain motions. She will benefit from continued strengthening. She has much tendency for flexed knee and trunk posture with standing and gait. Also reviewed ther ex to perform in water , if she can get to the [facility].  Pt to benefit from continued care.   Eval: Patient presents with primary complaint of  pain in R hip. She had previous fracture with surgery last year, and continues to have pain and deficit since. She has weakness in R hip and increased pain with movement. She is overall deconditioned and would benefit from increasing mobility. She also has difficulty with balance and has had a few falls with injury. She will benefit from further evaluation of balance at future visits, and treatment of this as needed, to prevent further falls. She has decreased gait mechanics, with valgus R knee and decreased heel strike/short stride on R.  Pt with decreased ability for full functional activities. Pt will  benefit from skilled PT to improve deficits and pain and to return to PLOF.   OBJECTIVE IMPAIRMENTS: Abnormal gait, decreased activity tolerance, decreased coordination, decreased mobility, difficulty walking, decreased ROM, decreased strength, impaired flexibility, impaired vision/preception, improper body mechanics, and pain.   ACTIVITY LIMITATIONS: standing, squatting, stairs, transfers, and  locomotion level  PARTICIPATION LIMITATIONS: meal prep, cleaning, laundry, driving, shopping, and community activity  PERSONAL FACTORS: Past/current experiences, Time since onset of injury/illness/exacerbation, and 1 comorbidity: surgery are also affecting patient's functional outcome.   REHAB POTENTIAL: Good  CLINICAL DECISION MAKING: Evolving/moderate complexity  EVALUATION COMPLEXITY: Moderate   GOALS: Goals reviewed with patient? Yes  SHORT TERM GOALS: Target date: 11/03/2023   Pt to be independent with initial HEP  Goal status: MET  2.  Pt to demo R hip ROM to be pain free in all directions.   Goal status: in progress    LONG TERM GOALS: Target date: 12/08/2023   Pt to  be independent with final HEP  Goal status: INITIAL  2.  Pt to report decreased pain in R hip to 0-3/10 with activity and walking   Goal status: INITIAL  3.  Pt to demo improved strength of R hip to be at least 4/5 , to improve stability, gait and pain   Goal status: INITIAL  4.  Balance goal TBD  Goal status: INITIAL   PLAN:  PT FREQUENCY: 1-2x/week  PT DURATION: 8 weeks  PLANNED INTERVENTIONS: Therapeutic exercises, Therapeutic activity, Neuromuscular re-education, Patient/Family education, Self Care, Joint mobilization, Joint manipulation, Stair training, Orthotic/Fit training, DME instructions, Aquatic Therapy, Dry Needling, Electrical stimulation, Cryotherapy, Moist heat, Taping, Ultrasound, Ionotophoresis 4mg /ml Dexamethasone , Manual therapy,  Vasopneumatic device, Traction, Spinal manipulation, Spinal mobilization,Balance training, Gait training,   PLAN FOR NEXT SESSION:  hip strength, hip abd strength,  step ups, hip ER Clams   Tinnie Don, PT, DPT 10:29 AM  11/29/23

## 2023-12-05 DIAGNOSIS — M25551 Pain in right hip: Secondary | ICD-10-CM | POA: Diagnosis not present

## 2023-12-11 ENCOUNTER — Encounter: Admitting: Physical Therapy

## 2023-12-12 ENCOUNTER — Ambulatory Visit (INDEPENDENT_AMBULATORY_CARE_PROVIDER_SITE_OTHER): Admitting: Family Medicine

## 2023-12-12 ENCOUNTER — Other Ambulatory Visit: Payer: Self-pay | Admitting: Cardiology

## 2023-12-12 ENCOUNTER — Other Ambulatory Visit: Payer: Self-pay | Admitting: Family Medicine

## 2023-12-12 ENCOUNTER — Encounter: Payer: Self-pay | Admitting: Family Medicine

## 2023-12-12 ENCOUNTER — Other Ambulatory Visit: Payer: Self-pay | Admitting: Internal Medicine

## 2023-12-12 VITALS — BP 125/73 | HR 67 | Temp 98.5°F | Ht 68.0 in | Wt 180.0 lb

## 2023-12-12 DIAGNOSIS — G8929 Other chronic pain: Secondary | ICD-10-CM

## 2023-12-12 DIAGNOSIS — M25551 Pain in right hip: Secondary | ICD-10-CM | POA: Diagnosis not present

## 2023-12-12 DIAGNOSIS — R0989 Other specified symptoms and signs involving the circulatory and respiratory systems: Secondary | ICD-10-CM | POA: Diagnosis not present

## 2023-12-12 DIAGNOSIS — N1831 Chronic kidney disease, stage 3a: Secondary | ICD-10-CM

## 2023-12-12 DIAGNOSIS — Z23 Encounter for immunization: Secondary | ICD-10-CM

## 2023-12-12 NOTE — Progress Notes (Signed)
 OFFICE VISIT  12/12/2023  CC:  Chief Complaint  Patient presents with   Hypertension    Patient is a 73 y.o. female who presents for uncontrolled hypertension. A/P as of last follow-up visit on 10/16/2023: #1 uncontrolled hypertension.  History of hypotension. Continue Toprol -XL 200 mg a day and HCTZ 25 mg a day (new prescription for 25 mg tab today) and losartan  50 mg twice a day. She requests referral to nephrology to see if they can help with blood pressure control.     #2.  Chronic renal insufficiency stage IIIa. She avoids NSAIDs. We have discussed appropriate hydration. We do have her on HCTZ 25 mg a day and losartan  25 mg twice a day but kidney function has not dropped from these medications. Referral to nephrology as per patient request.   #3 amlodipine  induced swelling. Resolved with discontinuation of amlodipine .  HPI: She is having trouble with her home blood pressure monitor.  Her old 1 started reading error.  She then got a new 1 and it says the same thing. Occasionally she will get a facial flushing sensation and feel like her blood pressure is up.  Denies headache or palpitations or chest pain or shortness of breath with these episodes of flushing.  Her chronic anxiety is unchanged. She is in pretty constant pain with her right hip and has seen a new orthopedist for a second opinion and he recommended total hip arthroplasty (Murphy/Wainer).  She saw nephrologist (Dr. Windle with [facility]) to establish care on 11/22/2023. No changes were made in her management. She was encouraged to try to check her blood pressure less often. She was told to take no NSAIDs. ROS as above, plus--> no fevers, no wheezing, no cough, no dizziness, no HAs, no rashes, no melena/hematochezia.  No polyuria or polydipsia.  No myalgias or arthralgias.  No focal weakness, paresthesias, or tremors.  No acute vision or hearing abnormalities.  No dysuria or unusual/new urinary urgency or  frequency.  No recent changes in lower legs. No n/v/d or abd pain.  No palpitations.    Past Medical History:  Diagnosis Date   Acute encephalopathy 11/19/2017   Alcoholism in remission [facility])    states last alcohol 11 yrs ago   Anemia    Anxiety    Bipolar 1 disorder (HCC)    Admission for mania x 2 (most recent 11/2017)   Cervical spondylosis without myelopathy 12/19/2013   MRI 02/2014: multilevel DDD/spondylosis, not much change compared to prior MRI.  Pt not in favor of invasive therapy for her neck as of 04/2014.   Chronic constipation    Chronic renal insufficiency, stage 3 (moderate) (HCC)    COPD (chronic obstructive pulmonary disease) (HCC)    Moderate: anoro started 04/2017 by pulm, pt symptomatically improved and PFTs stable at f/u 06/2017.   Coronary artery spasm (HCC) 07/11/2018   nitrates=HA. Amlodipine =intol swelling.  Changed to coreg  09/03/18 by cardiologist.   Depression    Endometritis 05/2017   possible; empiric tx with Flagyl  by GYN.   Femoral neck fracture (HCC)    right, 01/28/23-->ORIF   Fibromyalgia    GERD (gastroesophageal reflux disease)    Hepatic steatosis 03/2019   u/s abd   Herpes zoster 08/07/2017   L scapular region   Hiatal hernia    History of adenomatous polyp of colon 04/2008; 08/2014   No high grade dysplasia: recall 5 yrs (Dr. Dianna, [facility])   History of basal cell carcinoma excision    NOSE  History of Helicobacter pylori infection 04/2008   +gastric biopsy (gastritis but no metaplasia, dysplasia, or malignancy identified)   History of kidney stones    History of TIA (transient ischemic attack)    secondary to HELLP syndrome 1994 post c/s--  residual memory loss   Hyperlipidemia    statin started after her NSTEMI: goal  LDL <70.   Hypertension    Ischemic cardiomyopathy 05/2018   Echo EF 45-50% in context of NSTEMI from CA spasm. // Echo 8/21: 55-60, mild LVH, normal RVSF, RVSP 40, trivial MR   Left foot pain    Hallux  deformity+adhesive capsulitis+ hammertoe:  Severe structural bunion deformity with hallux interphalangeus and severe arthritis of the second MPJ left foot--surgical repair/osteotomies by Dr. Magdalen 04/2017.   Memory loss    Abnl MRI brain and CT brain c/w chronic microvascular ischemia.  Repeat MRI brain 02/2014 stable (Dr. Smitty with no plan to f/u with neuro as of 04/2014)   NSTEMI (non-ST elevated myocardial infarction) (HCC) 05/2018   Echo EF 45-50% in context of NSTEMI from CA spasm.// Myoview  8/21: EF 66, no ischemia, low risk   Osteoarthritis of left wrist    STT and 1st [facility] jt   Osteoporosis 05/2010   2012 'penia; 06/2015 'porosis:  prolia .  DEXA 09/2017 T-score -2.2 femur neck. 11/2021 T score -2.2. Rpt 2 yrs   Pelvic floor dysfunction    [facility] (Dr. Cam)   Postmenopausal vaginal bleeding 05/2017   x 1 small episode; GYN attempted endo bx but unable to penetrate cervix due to severe cerv stenosis (due to postmenopausal state + hx of LEEP).  Endo u/s showed thin endo lining.  Per GYN---no evidence of endomet pathology on w/u---obs as of 07/2017.   Prediabetes 06/2016   Fasting gluc 105; HbA1c at that time was 6.0%.  A1c 6.2% 12/2017.  A1c 5.7% Feb 2023.   Recurrent kidney stones 08/2013   Left ureteral calculus: Perc nephr--cystoscopy w/ureteroscopy + laser for stone removal.  Residual asymptomatic left renal nephrolithiasis <21mm present post-procedure.  Right sided hydronephrosis-persistent (on u/s)--urol ordered CT to further eval 08/2016: 1.6 cm nonobstructing stone lower pole left kidney, no hydro, plan for PCN extraction ([facility])   Sepsis (HCC)    UTI   Suprapubic discomfort 06/05/2015   TMJ (temporomandibular joint disorder)    USES  MOUTH GUARD   Toe fracture, left 12/2017   2nd toe prox phalanx (immobilization, Dr. Elner)   Vitamin D deficiency 05/2011    Past Surgical History:  Procedure Laterality Date   ABIs  11/29/2019   NORMAL   ANTERIOR CERVICAL  DECOMP/DISCECTOMY FUSION  06/30/2009   C3 -- C5  AND EXPLORATION OF FUSION C5-7 W/  PLATE REMOVAL   ARTHRODESIS METATARSALPHALANGEAL JOINT (MTPJ) Left 07/08/2021   Procedure: Left hallux metatarsophalangeal joint arthrodesis;  Surgeon: Kit Rush, MD;  Location: [facility];  Service: Orthopedics;  Laterality: Left;   BACK SURGERY     BUNIONECTOMY Left    CARDIOVASCULAR STRESS TEST  12/03/2019   myoc perf im: NORMAL   CERVICAL FUSION  2002   anterior C5 -- C7   CERVICAL SPINE SURGERY  08/27/1999     C6 -- T1  LAMINECTOMY/  DISKECTOMY   CESAREAN SECTION  1994   CHOLECYSTECTOMY N/A 03/06/2020   Procedure: LAPAROSCOPIC CHOLECYSTECTOMY;  Surgeon: Lyndel Deward PARAS, MD;  Location: [facility];  Service: General;  Laterality: N/A;   COLONOSCOPY W/ POLYPECTOMY  04/2008;08/2014   2016 tubular adenoma x 1; +  diverticulosis and int/ext hemorrhoids.  06/2020 adenoma, recall 5 yrs.   CYSTOSCOPY WITH URETEROSCOPY AND STENT PLACEMENT Left 08/28/2013   Procedure: CYSTOSCOPY, RGP,  WITH URETEROSCOPY AND STENT REMOVAL;  Surgeon: Alm GORMAN Fragmin, MD;  Location: [facility];  Service: Urology;  Laterality: Left;   DEXA  09/2017   T-score -2.2 femur neck: improved compared to 06/2015.  2021 DEXA T score -2.3.  11/2021 DEXA T score -2.2.   ESOPHAGOGASTRODUODENOSCOPY  2010; 08/2014   2016 +Candidal esophagitis; Mild chronic gastritis w/intestinal metaplasia--NEG H pylori, neg for eosinophilic esoph. 06/2020 esoph candidiasis (diflucan  x 20d), o/w normal.   HAMMER TOE SURGERY Left 07/08/2021   Procedure: Second metatarsal head excision; revision Second hammertoe correction; 2-5 percutaneus flexor tenotomies;  Surgeon: Kit Rush, MD;  Location: [facility];  Service: Orthopedics;  Laterality: Left;   HARDWARE REMOVAL Left 07/08/2021   Procedure: Removal of deep implants from hallux proximal phalanx and First and Second metatarsals;  Surgeon: Kit Rush, MD;  Location: [facility];  Service: Orthopedics;  Laterality: Left;   HIP PINNING,CANNULATED Right 01/28/2023   Procedure: PERCUTANEOUS FIXATION OF RIGHT FEMORAL NECK;  Surgeon: Sharl Selinda Dover, MD;  Location: [facility] OR;  Service: Orthopedics;  Laterality: Right;   HOLMIUM LASER APPLICATION Left 08/28/2013   Procedure: HOLMIUM LASER APPLICATION;  Surgeon: Alm GORMAN Fragmin, MD;  Location: [facility];  Service: Urology;  Laterality: Left;   LEFT HEART CATH AND CORONARY ANGIOGRAPHY N/A 05/15/2018   Evidence for spasm in distal LAD.  Mod RCA atherosclerosis, o/w no CAD.  Procedure: LEFT HEART CATH AND CORONARY ANGIOGRAPHY;  Surgeon: Anner Alm ORN, MD;  Location: [facility];  Service: Cardiovascular;  Laterality: N/A;   NEGATIVE SLEEP STUDY  04/01/2007   NEPHROLITHOTOMY Left 08/05/2013   Procedure: NEPHROLITHOTOMY PERCUTANEOUS;  Surgeon: Alm GORMAN Fragmin, MD;  Location: [facility];  Service: Urology;  Laterality: Left;   POLYSOMNOGRAM  01/2019   NORMAL   POLYSOMNOGRAPHY  10/25/2018   Dr. Wilbert Passey due to pt inadequat sleep time (83 min).   TONSILLECTOMY AND ADENOIDECTOMY  1975   TOTAL KNEE ARTHROPLASTY Right 2003   TOTAL KNEE REVISION Right 01/19/2022   Procedure: Right knee polyethylene revision;  Surgeon: Melodi Lerner, MD;  Location: [facility];  Service: Orthopedics;  Laterality: Right;   TRANSTHORACIC ECHOCARDIOGRAM  05/2018; 12/03/2019   (after MI secondary to arterial spasm): EF 45-50%; severe hypokinesis of apical anterolateral, inferolateral , anterior, and inferior LV myocardium. 11/2019 EF 55-60%, valves fine   ULTRASOUND EXAM,PELVIC COMPLETE ([facility] HX)  06/25/2015   NORMAL (done by GYN)    Outpatient Medications Prior to Visit  Medication Sig Dispense Refill   albuterol  (VENTOLIN  HFA) 108 (90 Base) MCG/ACT inhaler TAKE 2 PUFFS BY MOUTH EVERY 6 HOURS AS NEEDED FOR WHEEZE OR SHORTNESS OF BREATH 18 each 0   ALPRAZolam  (XANAX ) 0.5 MG tablet TAKE 1-2 TABLETS  BY MOUTH THREE TIMES A DAY AS NEEDED FOR ANXIETY 180 tablet 5   atorvastatin  (LIPITOR ) 80 MG tablet TAKE 1 TABLET BY MOUTH EVERY DAY 90 tablet 1   buPROPion  (WELLBUTRIN  XL) 300 MG 24 hr tablet TAKE 1 TABLET BY MOUTH EVERY DAY 90 tablet 1   citalopram  (CELEXA ) 40 MG tablet TAKE 1 TABLET BY MOUTH EVERY DAY 30 tablet 0   famotidine  (PEPCID ) 40 MG tablet TAKE 1 TABLET BY MOUTH EVERY DAY 90 tablet 1   fluticasone  (FLONASE ) 50 MCG/ACT nasal spray SPRAY 2 SPRAYS INTO EACH NOSTRIL  EVERY DAY 48 mL 1   hydrochlorothiazide  (HYDRODIURIL ) 25 MG tablet Take 1 tablet (25 mg total) by mouth daily. 90 tablet 3   HYDROcodone -acetaminophen  (NORCO/VICODIN) 5-325 MG tablet 1-2 tab po bid as needed for pain 90 tablet 0   lamoTRIgine  (LAMICTAL ) 100 MG tablet TAKE 3 TABLETS (300 MG TOTAL) BY MOUTH AT BEDTIME 270 tablet 2   loratadine  (CLARITIN ) 10 MG tablet Take 10 mg by mouth daily.     losartan  (COZAAR ) 25 MG tablet 1 tab po bid 180 tablet 3   metoprolol  succinate (TOPROL -XL) 100 MG 24 hr tablet 1 and 1/2 tabs p.o. daily.  Take with or immediately following a meal. 135 tablet 1   montelukast  (SINGULAIR ) 10 MG tablet TAKE 1 TABLET BY MOUTH EVERY DAY IN THE EVENING 90 tablet 1   nitroGLYCERIN  (NITROSTAT ) 0.4 MG SL tablet PLACE 1 TABLET (0.4 MG TOTAL) UNDER THE TONGUE EVERY 5 (FIVE) MINUTES X 3 DOSES AS NEEDED FOR CHEST PAIN. 25 tablet 1   ondansetron  (ZOFRAN -ODT) 4 MG disintegrating tablet Take 1 tablet (4 mg total) by mouth every 8 (eight) hours as needed. 8 tablet 0   pantoprazole  (PROTONIX ) 40 MG tablet TAKE 1 TABLET (40 MG TOTAL) BY MOUTH DAILY. KEEP F/U APPT FOR ANY FURTHER REFILLS 90 tablet 0   potassium chloride  SA (KLOR-CON  M) 20 MEQ tablet TAKE 1 TABLET BY MOUTH EVERY DAY 90 tablet 1   Tiotropium Bromide -Olodaterol (STIOLTO RESPIMAT ) 2.5-2.5 MCG/ACT AERS Inhale 2 each into the lungs daily. 4 g 5   meloxicam  (MOBIC ) 15 MG tablet TAKE 1 TABLET BY MOUTH ONCE DAILY AS NEEDED FOR JOINT PAIN 30 tablet 2   No  facility-administered medications prior to visit.    Allergies  Allergen Reactions   Amlodipine  Swelling   Ceftin [Cefuroxime Axetil] Anaphylaxis and Swelling    Swelling of eyes and tongue   Cipro  [Ciprofloxacin  Hcl] Anaphylaxis and Swelling   Risperdal  [Risperidone ] Palpitations    Generalized weakness Malaise  Hypertension   Fosamax  [Alendronate  Sodium] Diarrhea   Linzess [Linaclotide] Diarrhea   Morphine  And Codeine Other (See Comments)    Hallucinations Disorientation    Movantik  [Naloxegol ]     N/v   Prednisone  Other (See Comments)    Hyperactivity   Roxicodone  [Oxycodone ] Other (See Comments)    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     Review of Systems  As per HPI  PE:    12/12/2023   10:32 AM 10/16/2023   11:05 AM 10/09/2023    4:00 PM  Vitals with BMI  Height 5' 8 5' 8   Weight 180 lbs 180 lbs 3 oz 183 lbs  BMI 27.38 27.41   Systolic 125 124 895  Diastolic 73 73 64  Pulse 67 64 67   Physical Exam  Gen: Alert, well appearing.  Patient is oriented to person, place, time, and situation. AFFECT: pleasant, lucid thought and speech. CV: RRR, no m/r/g.   LUNGS: CTA bilat, nonlabored resps, good aeration in all lung fields. EXT: no clubbing or cyanosis.  no edema.   LABS:  Last metabolic panel Lab Results  Component Value Date   GLUCOSE 105 (H) 10/09/2023   NA 135 10/09/2023   K 4.0 10/09/2023   CL 99 10/09/2023   CO2 27 10/09/2023   BUN 25 10/09/2023   CREATININE 1.19 (H) 10/09/2023   EGFR 49 (L) 10/09/2023   CALCIUM  9.3 10/09/2023   PROT 6.3 10/09/2023   ALBUMIN   3.8 07/25/2023   BILITOT 0.4 10/09/2023   ALKPHOS 63 07/25/2023   AST 14 10/09/2023   ALT 14 10/09/2023   ANIONGAP 9 07/25/2023   Lab Results  Component Value Date   TSH 0.91 10/12/2022   IMPRESSION AND PLAN:  #1 hypertension, labile. She will be getting a new blood pressure cuff. Blood pressure is normal here  today. No changes in management--> continue HCTZ 25 mg a day, Toprol -XL 150 mg a day, and losartan  25 mg twice daily.    #2 chronic renal insufficiency stage III. Serum creatinine stable at 1.19 on 10/09/2023. She established with nephrology last month.  They felt like her risk of progression was low. We will avoid NSAIDs completely.  #3 chronic right hip pain. She is status post fracture 01/2023 with subsequent percutaneous pinning of right femoral neck. Will get records from her latest orthopedics consult.  She reports that she was told that her screws are moving and the surgeon recommended total hip arthroplasty. She is going to move forward with this. She will make an appointment to get cardiac clearance soon. No NSAIDs.  An After Visit Summary was printed and given to the patient.  FOLLOW UP: Return in about 6 weeks (around 01/23/2024) for routine chronic illness f/u.  Signed:  Gerlene Hockey, MD           12/12/2023

## 2023-12-18 ENCOUNTER — Encounter: Payer: Self-pay | Admitting: Physical Therapy

## 2023-12-18 ENCOUNTER — Other Ambulatory Visit: Payer: Self-pay | Admitting: Cardiology

## 2023-12-18 ENCOUNTER — Other Ambulatory Visit: Payer: Self-pay | Admitting: Family Medicine

## 2023-12-18 ENCOUNTER — Ambulatory Visit: Admitting: Physical Therapy

## 2023-12-18 DIAGNOSIS — R2689 Other abnormalities of gait and mobility: Secondary | ICD-10-CM

## 2023-12-18 DIAGNOSIS — M25551 Pain in right hip: Secondary | ICD-10-CM

## 2023-12-18 NOTE — Therapy (Signed)
 OUTPATIENT PHYSICAL THERAPY LOWER EXTREMITY TREATMENT/Re-Cert    Patient Name: Erin Lopez MRN: [id number] DOB:December 15, 1950, 73 y.o., female Today's Date: 12/18/2023  END OF SESSION:  PT End of Session - 12/18/23 1016     Visit Number 8    Number of Visits 16    Date for PT Re-Evaluation 12/08/23    Authorization Type [employer], rcert done at visit 8    PT Start Time 1020    PT Stop Time 1100    PT Time Calculation (min) 40 min    Activity Tolerance Patient tolerated treatment well    Behavior During Therapy [facility] for tasks assessed/performed             Past Medical History:  Diagnosis Date   Acute encephalopathy 11/19/2017   Alcoholism in remission (HCC)    states last alcohol 11 yrs ago   Anemia    Anxiety    Bipolar 1 disorder (HCC)    Admission for mania x 2 (most recent 11/2017)   Cervical spondylosis without myelopathy 12/19/2013   MRI 02/2014: multilevel DDD/spondylosis, not much change compared to prior MRI.  Pt not in favor of invasive therapy for her neck as of 04/2014.   Chronic constipation    Chronic renal insufficiency, stage 3 (moderate) (HCC)    COPD (chronic obstructive pulmonary disease) (HCC)    Moderate: anoro started 04/2017 by pulm, pt symptomatically improved and PFTs stable at f/u 06/2017.   Coronary artery spasm (HCC) 07/11/2018   nitrates=HA. Amlodipine =intol swelling.  Changed to coreg  09/03/18 by cardiologist.   Depression    Endometritis 05/2017   possible; empiric tx with Flagyl  by GYN.   Femoral neck fracture (HCC)    right, 01/28/23-->ORIF   Fibromyalgia    GERD (gastroesophageal reflux disease)    Hepatic steatosis 03/2019   u/s abd   Herpes zoster 08/07/2017   L scapular region   Hiatal hernia    History of adenomatous polyp of colon 04/2008; 08/2014   No high grade dysplasia: recall 5 yrs (Dr. Dianna, [facility])   History of basal cell carcinoma excision    NOSE   History of Helicobacter pylori infection 04/2008    +gastric biopsy (gastritis but no metaplasia, dysplasia, or malignancy identified)   History of kidney stones    History of TIA (transient ischemic attack)    secondary to HELLP syndrome 1994 post c/s--  residual memory loss   Hyperlipidemia    statin started after her NSTEMI: goal  LDL <70.   Hypertension    Ischemic cardiomyopathy 05/2018   Echo EF 45-50% in context of NSTEMI from CA spasm. 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 Magdalen 04/2017.   Memory loss    Abnl MRI brain and CT brain c/w chronic microvascular ischemia.  Repeat MRI brain 02/2014 stable (Dr. Smitty with no plan to f/u with neuro as of 04/2014)   NSTEMI (non-ST elevated myocardial infarction) (HCC) 05/2018   Echo EF 45-50% in context of NSTEMI from CA spasm.// Myoview  8/21: EF 66, no ischemia, low risk   Osteoarthritis of left wrist    STT and [facility] jt   Osteoporosis 05/2010   2012 'penia; 06/2015 'porosis:  prolia .  DEXA 09/2017 T-score -2.2 femur neck. 11/2021 T score -2.2. Rpt  2 yrs   Pelvic floor dysfunction    [facility] (Dr. Cam)   Postmenopausal vaginal bleeding 05/2017   x 1 small episode; GYN attempted endo bx but unable to penetrate cervix due to severe cerv stenosis (due to postmenopausal state + hx of LEEP).  Endo u/s showed thin endo lining.  Per GYN---no evidence of endomet pathology on w/u---obs as of 07/2017.   Prediabetes 06/2016   Fasting gluc 105; HbA1c at that time was 6.0%.  A1c 6.2% 12/2017.  A1c 5.7% Feb 2023.   Recurrent kidney stones 08/2013   Left ureteral calculus: Perc nephr--cystoscopy w/ureteroscopy + laser for stone removal.  Residual asymptomatic left renal nephrolithiasis <49mm present post-procedure.  Right sided hydronephrosis-persistent  (on u/s)--urol ordered CT to further eval 08/2016: 1.6 cm nonobstructing stone lower pole left kidney, no hydro, plan for PCN extraction ([facility])   Sepsis (HCC)    UTI   Suprapubic discomfort 06/05/2015   TMJ (temporomandibular joint disorder)    USES  MOUTH GUARD   Toe fracture, left 12/2017   2nd toe prox phalanx (immobilization, Dr. Elner)   Vitamin D deficiency 05/2011   Past Surgical History:  Procedure Laterality Date   ABIs  11/29/2019   NORMAL   ANTERIOR CERVICAL DECOMP/DISCECTOMY FUSION  06/30/2009   C3 -- C5  AND EXPLORATION OF FUSION C5-7 W/  PLATE REMOVAL   ARTHRODESIS METATARSALPHALANGEAL JOINT (MTPJ) Left 07/08/2021   Procedure: Left hallux metatarsophalangeal joint arthrodesis;  Surgeon: Kit Rush, MD;  Location: [facility];  Service: Orthopedics;  Laterality: Left;   BACK SURGERY     BUNIONECTOMY Left    CARDIOVASCULAR STRESS TEST  12/03/2019   myoc perf im: NORMAL   CERVICAL FUSION  2002   anterior C5 -- C7   CERVICAL SPINE SURGERY  08/27/1999     C6 -- T1  LAMINECTOMY/  DISKECTOMY   CESAREAN SECTION  1994   CHOLECYSTECTOMY N/A 03/06/2020   Procedure: LAPAROSCOPIC CHOLECYSTECTOMY;  Surgeon: Lyndel Deward PARAS, MD;  Location: [facility];  Service: General;  Laterality: N/A;   COLONOSCOPY W/ POLYPECTOMY  04/2008;08/2014   2016 tubular adenoma x 1; +diverticulosis and int/ext hemorrhoids.  06/2020 adenoma, recall 5 yrs.   CYSTOSCOPY WITH URETEROSCOPY AND STENT PLACEMENT Left 08/28/2013   Procedure: CYSTOSCOPY, RGP,  WITH URETEROSCOPY AND STENT REMOVAL;  Surgeon: Alm GORMAN Fragmin, MD;  Location: [facility];  Service: Urology;  Laterality: Left;   DEXA  09/2017   T-score -2.2 femur neck: improved compared to 06/2015.  2021 DEXA T score -2.3.  11/2021 DEXA T score -2.2.   ESOPHAGOGASTRODUODENOSCOPY  2010; 08/2014   2016 +Candidal esophagitis; Mild chronic gastritis w/intestinal metaplasia--NEG H pylori, neg for eosinophilic esoph. 06/2020 esoph  candidiasis (diflucan  x 20d), o/w normal.   HAMMER TOE SURGERY Left 07/08/2021   Procedure: Second metatarsal head excision; revision Second hammertoe correction; 2-5 percutaneus flexor tenotomies;  Surgeon: Kit Rush, MD;  Location: [facility];  Service: Orthopedics;  Laterality: Left;   HARDWARE REMOVAL Left 07/08/2021   Procedure: Removal of deep implants from hallux proximal phalanx and First and Second metatarsals;  Surgeon: Kit Rush, MD;  Location: [facility];  Service: Orthopedics;  Laterality: Left;   HIP PINNING,CANNULATED Right 01/28/2023   Procedure: PERCUTANEOUS FIXATION OF RIGHT FEMORAL NECK;  Surgeon: Sharl Selinda Dover, MD;  Location: [facility] OR;  Service: Orthopedics;  Laterality: Right;   HOLMIUM LASER APPLICATION Left 08/28/2013   Procedure: HOLMIUM LASER APPLICATION;  Surgeon: Alm GORMAN  Alline, MD;  Location: [facility];  Service: Urology;  Laterality: Left;   LEFT HEART CATH AND CORONARY ANGIOGRAPHY N/A 05/15/2018   Evidence for spasm in distal LAD.  Mod RCA atherosclerosis, o/w no CAD.  Procedure: LEFT HEART CATH AND CORONARY ANGIOGRAPHY;  Surgeon: Anner Alm ORN, MD;  Location: [facility];  Service: Cardiovascular;  Laterality: N/A;   NEPHROLITHOTOMY Left 08/05/2013   Procedure: NEPHROLITHOTOMY PERCUTANEOUS;  Surgeon: Alm GORMAN Alline, MD;  Location: [facility];  Service: Urology;  Laterality: Left;   POLYSOMNOGRAM  01/2019   NORMAL 2008 and 2020   POLYSOMNOGRAPHY  10/25/2018   Dr. Wilbert Passey due to pt inadequat sleep time (83 min).   TONSILLECTOMY AND ADENOIDECTOMY  1975   TOTAL KNEE ARTHROPLASTY Right 2003   TOTAL KNEE REVISION Right 01/19/2022   Procedure: Right knee polyethylene revision;  Surgeon: Melodi Lerner, MD;  Location: [facility];  Service: Orthopedics;  Laterality: Right;   TRANSTHORACIC ECHOCARDIOGRAM  05/2018; 12/03/2019   (after MI secondary to arterial spasm): EF 45-50%; severe hypokinesis  of apical anterolateral, inferolateral , anterior, and inferior LV myocardium. 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   Healthcare maintenance 04/19/2019   Overweight (BMI 25.0-29.9) 02/19/2019   Ischemic cardiomyopathy 09/03/2018   Coronary vasospasm (HCC) 07/11/2018   NSTEMI (non-ST elevated myocardial infarction) (HCC) 05/14/2018   Hyponatremia 11/16/2017   Bipolar 1 disorder (HCC) 12/29/2016   Hypertension 12/29/2016   History of TIA (transient ischemic attack) 12/29/2016   Chronic pelvic pain in female 08/22/2016   Stage 2 chronic kidney disease 08/22/2016   Osteoporosis 09/30/2015   Health maintenance examination 06/11/2015   Urethral caruncle 06/05/2015   UTI (urinary tract infection) 06/05/2015   TMJ (temporomandibular joint disorder) 02/25/2015   Right ankle injury 11/05/2014   Cervical spondylosis without myelopathy 12/19/2013   Sprain of neck 12/19/2013   Memory difficulties 12/19/2013   Postnasal drip 10/28/2013   Constipation, chronic 07/29/2013   COPD (chronic obstructive pulmonary disease) (HCC) 06/30/2013   Kidney stone on left side 05/29/2013   Hyperlipidemia 05/29/2013     PCP: Phiip McGowen  REFERRING PROVIDER: Phiip McGowen  REFERRING DIAG: R hip pain   THERAPY DIAG:  Pain in right hip  Other abnormalities of gait and mobility  Rationale for Evaluation and Treatment: Rehabilitation  ONSET DATE:  01/28/23   SUBJECTIVE:   SUBJECTIVE STATEMENT: Pt  saw ortho, recommended that she have Hip replacement on R. She will likely schedule in the next few months. Pt has been  seen for 8 visits.   Eval: Pt had fall, suffering R femur fracture on 01/28/23 last year.  She had fixation of femoral neck by Dr. Sharl. States difficulty in hospital and recovering from this, had sepsis, etc. She had difficulty with mobility after this, did not have much PT, had some home heath. States R hip still sore, notes pain in lateral hip with activity and walking.  She also had another fall in April at home, fell hard, broke nose, hurt face, concussion and broke R wrist. Still wearing brace on R hand, but reports wrist  doing well at this time.  She notes difficulty with BP lately, being high and low. States difficulty with motivation lately, and feels like she has no energy- MD aware.  Reports sleeping well at night.  Lives alone, does have life alert and does drive.  States she feels something moving in hip when she turns her leg a certain direction ( S/L Hip er/ir)    PERTINENT HISTORY: CKD, HTN,  TIA,  COPD, Osteoporosis, Bipolar,  R TKA and revision  PAIN:  Are you having pain? Yes: NPRS scale: up to 5/10 Pain location: R lateral hip Pain description: sore Aggravating factors: stairs, moving sideways,  Relieving factors: rest/sitting   PRECAUTIONS: Fall  WEIGHT BEARING RESTRICTIONS: No  FALLS:  Has patient fallen in last 6 months? Yes. Number of falls 2 - broke R wrist/outside got out of car,     PLOF: Independent   PATIENT GOALS:  Decreased pain in hip, increased activity level.   NEXT MD VISIT:   OBJECTIVE:   DIAGNOSTIC FINDINGS:   PATIENT SURVEYS:   COGNITION: Overall cognitive status: Within functional limits for tasks assessed     SENSATION: WFL  EDEMA: n/a   POSTURE:    No Significant postural limitations  PALPATION: Tenderness at lateral R hip, and gr trochanter   LOWER EXTREMITY ROM: Hip flex, IR, ER- mild limitations Knees: WFL   LOWER EXTREMITY MMT:  MMT Left eval Right  eval 11/27/2023 Right 12/18/23 Right  Hip flexion 4+ 4- 4   4  Hip extension      Hip abduction  3/pain 4- 4- (crepitus)   Hip adduction      Hip internal rotation      Hip external rotation 4+ 4 4 4+  Knee flexion 5 4+  5  Knee extension 5 4  4+  Ankle dorsiflexion      Ankle plantarflexion      Ankle inversion      Ankle eversion       (Blank rows = not tested)   LOWER EXTREMITY SPECIAL TESTS:    FUNCTIONAL TESTS:     GAIT: Distance walked: 150 Assistive device utilized: None Level of assistance: Complete Independence Comments: R valgus knee, decreased knee extension and heel strike on R.    TODAY'S TREATMENT:                                                                                                                              DATE:   12/18/2023 Therapeutic Exercise: Aerobic: Supine:    Education and practice for TA contraction ;    SLR x 15 on R  Bridging with GTB at thighs x 15 ;  Clams GTB x 15;   Supine march with GTB x 15;  Seated:    LAQ 3 lb  2 x 10 bil;  S/L:   Standing:   hip abd 2 x 10 bil   Stretches:  Neuromuscular Re-education:  Manual Therapy:  Therapeutic Activity:   mini squats at counter  2 x 10 with cueing for mechanics.  Side stepping  at counter 3 x 10;   Sit to stand high mat table  x 10;  Self Care:     Therapeutic Exercise: Aerobic: Supine:   SLR 2 x 10 bil( tried slight Er on R- harder/slight pain)     hip ER GTB x 20;  Seated:  LAQ 3 lb x 20 on R;  S/L: trial hip abd- grinding in hip  Standing:  squats at counter x 12;  at table x 10 ( cueing for hip and knee placement )   Hip abd 3 x 5 on R;   side stepping at counter x 10 Stretches:  Neuromuscular Re-education: Manual Therapy: Therapeutic Activity:    Self Care:   Ther ex:  Supine: SLR 2 x 10 on R;  Hip clams GTB x 15;(sore by end) ;  LAQ x 15 on L;    PATIENT EDUCATION:  Education details: updated and reviewed HEP Person educated: Patient Education method: Explanation, Demonstration, Tactile cues, Verbal cues, and  Handouts Education comprehension: verbalized understanding, returned demonstration, verbal cues required, tactile cues required, and needs further education   HOME EXERCISE PROGRAM: Access Code: [email or web address] URL: [email or web address] Date: 10/13/2023 Prepared by: Tinnie Don Exercises - Straight Leg Raise  - 1 x daily - 2 sets - 5- 10 reps - Seated Knee Extension AROM  - 2 x daily - 2 sets - 10 reps  ASSESSMENT:  CLINICAL IMPRESSION: Re-Cert. Pt has shown improvements with hip rom and strength since eval. She is doing well with initial HEP. She continues to have weakness and difficulty with ER and abduction motions as well as stairs, due to audible and palpable crepitus with associated pain. She will benefit from continued care for strengthening and gait mechanics. She will benefit from continued care at 1 time per week or every other week, for a few more visits, to ensure max ability and HEP prior to her having possible hip replacement in future.    Eval: Patient presents with primary complaint of  pain in R hip. She had previous fracture with surgery last year, and continues to have pain and deficit since. She has weakness in R hip and increased pain with movement. She is overall deconditioned and would benefit from increasing mobility. She also has difficulty with balance and has had a few falls with injury. She will benefit from further evaluation of balance at future visits, and treatment of this as needed, to prevent further falls. She has decreased gait mechanics, with valgus R knee and decreased heel strike/short stride on R.  Pt with decreased ability for full functional activities. Pt will  benefit from skilled PT to improve deficits and pain and to return to PLOF.   OBJECTIVE IMPAIRMENTS: Abnormal gait, decreased activity tolerance, decreased coordination, decreased mobility, difficulty walking, decreased ROM, decreased strength, impaired flexibility, impaired  vision/preception, improper body mechanics, and pain.   ACTIVITY LIMITATIONS: standing, squatting, stairs, transfers, and locomotion level  PARTICIPATION LIMITATIONS: meal prep, cleaning, laundry, driving, shopping, and community activity  PERSONAL FACTORS: Past/current experiences, Time since onset of injury/illness/exacerbation, and 1 comorbidity: surgery are also affecting patient's functional outcome.   REHAB POTENTIAL: Good  CLINICAL DECISION MAKING: Evolving/moderate complexity  EVALUATION COMPLEXITY: Moderate   GOALS: Goals reviewed with patient? Yes  SHORT TERM GOALS: Target date: 11/03/2023   Pt to be independent with initial HEP  Goal status: MET  2.  Pt  to demo R hip ROM to be pain free in all directions.   Goal status: in progress    LONG TERM GOALS: Target date: 02/12/2024   Pt to be independent with final HEP  Goal status: In progress  2.  Pt to report decreased pain in R hip to 0-3/10 with activity and walking   Goal status: In progress  3.  Pt to demo improved strength of R hip to be at least 4/5 , to improve stability, gait and pain   Goal status: Met- for some directions      PLAN:  PT FREQUENCY: 1-2x/week  PT DURATION: 8 weeks  PLANNED INTERVENTIONS: Therapeutic exercises, Therapeutic activity, Neuromuscular re-education, Patient/Family education, Self Care, Joint mobilization, Joint manipulation, Stair training, Orthotic/Fit training, DME instructions, Aquatic Therapy, Dry Needling, Electrical stimulation, Cryotherapy, Moist heat, Taping, Ultrasound, Ionotophoresis 4mg /ml Dexamethasone , Manual therapy,  Vasopneumatic device, Traction, Spinal manipulation, Spinal mobilization,Balance training, Gait training,   PLAN FOR NEXT SESSION:    Tinnie Don, PT, DPT 10:16 AM  12/18/23

## 2023-12-19 ENCOUNTER — Ambulatory Visit: Payer: Self-pay | Admitting: *Deleted

## 2023-12-19 NOTE — Telephone Encounter (Signed)
 No further action needed.

## 2023-12-19 NOTE — Telephone Encounter (Signed)
 Last OV 9/9, #1 hypertension, labile. She will be getting a new blood pressure cuff. Blood pressure is normal here today. No changes in management--> continue HCTZ 25 mg a day, Toprol -XL 150 mg a day, and losartan  25 mg twice daily.

## 2023-12-19 NOTE — Telephone Encounter (Signed)
 FYI Only or Action Required?: FYI only for provider.  Patient was last seen in primary care on 12/12/2023 by McGowen, Aleene DEL, MD.  Called Nurse Triage reporting Animal Bite.  Symptoms began today.  Interventions attempted: Other: cleaned with soap and water .  Symptoms are: unchanged.  Triage Disposition: See HCP Within 4 Hours (Or PCP Triage)  Patient/caregiver understands and will follow disposition?: No, refuses disposition. Patient states her insurance will not cover UC- no other appointment open at alternative locations- patient has been scheduled with PCP next day due to need for assessment/possible medication Reason for Disposition  [1] Puncture wound (hole through the skin) AND [2] from a cat bite (or deep claw puncture wound)  Answer Assessment - Initial Assessment Questions 1. APPEARANCE What does it look like?  (e.g., abrasion, bruise, puncture)      Multiple punctures 2. SIZE: How big is the bite? (e.g., inches, cm; or compare to size of coin, pea, grape, ping pong ball)      Forearm area 3. LOCATION: Where is the bite located?      Left forearm 4. ONSET: When did the bite happen? (e.g., minutes, hours ago)      2 hours ago 5. ANIMAL: What type of animal caused the bite? Is the injury from a bite or a claw? If the animal is a dog or a cat, ask: Was it a pet or a stray? Was it acting ill or behaving strangely?     Cat  bite, pet, no 6. RABIES VACCINE: For dog or cat bites, ask: Do you know if the pet is vaccinated against rabies?  (e.g., yes, no, overdue for rabies shot, unknown)     Up to date with vaccine 7. CIRCUMSTANCES: Tell me how this happened.      Cat attacked patient while petting 8. TETANUS: When was your last tetanus booster?     Up to date- 2023  Protocols used: Animal Bite-A-AH  Copied from CRM [id number]. Topic: Clinical - Red Word Triage >> Dec 19, 2023  1:15 PM Suzen RAMAN wrote: [facility] that prompted transfer to Nurse Triage: cat  bite

## 2023-12-20 ENCOUNTER — Ambulatory Visit (INDEPENDENT_AMBULATORY_CARE_PROVIDER_SITE_OTHER): Admitting: Family Medicine

## 2023-12-20 ENCOUNTER — Encounter: Payer: Self-pay | Admitting: Family Medicine

## 2023-12-20 VITALS — BP 169/86 | HR 64 | Temp 98.0°F | Ht 68.0 in | Wt 182.2 lb

## 2023-12-20 DIAGNOSIS — S51852A Open bite of left forearm, initial encounter: Secondary | ICD-10-CM | POA: Diagnosis not present

## 2023-12-20 DIAGNOSIS — S61552A Open bite of left wrist, initial encounter: Secondary | ICD-10-CM

## 2023-12-20 DIAGNOSIS — W5501XA Bitten by cat, initial encounter: Secondary | ICD-10-CM | POA: Diagnosis not present

## 2023-12-20 DIAGNOSIS — L089 Local infection of the skin and subcutaneous tissue, unspecified: Secondary | ICD-10-CM | POA: Diagnosis not present

## 2023-12-20 MED ORDER — AMOXICILLIN-POT CLAVULANATE 875-125 MG PO TABS: 1.0000 | ORAL_TABLET | Freq: Two times a day (BID) | ORAL | 0 refills | Status: DC

## 2023-12-20 NOTE — Progress Notes (Signed)
 OFFICE VISIT  12/20/2023  CC:  Chief Complaint  Patient presents with   Animal Bite    Left forearm, multiple punctures; last td 10/15/21    Patient is a 73 y.o. female who presents for cat bite.  HPI: Her cat bit her on the left arm yesterday.  She says it was jealous because she was giving some attention to a dog nearby. The cat has not been acting ill at all. The arm hurts and has turned red.  She has pain with range of motion of the wrist. No fever, chills, or malaise.  Tetanus up-to-date 2023.  Past Medical History:  Diagnosis Date   Acute encephalopathy 11/19/2017   Alcoholism in remission [facility])    states last alcohol 11 yrs ago   Anemia    Anxiety    Bipolar 1 disorder (HCC)    Admission for mania x 2 (most recent 11/2017)   Cervical spondylosis without myelopathy 12/19/2013   MRI 02/2014: multilevel DDD/spondylosis, not much change compared to prior MRI.  Pt not in favor of invasive therapy for her neck as of 04/2014.   Chronic constipation    Chronic renal insufficiency, stage 3 (moderate) (HCC)    COPD (chronic obstructive pulmonary disease) (HCC)    Moderate: anoro started 04/2017 by pulm, pt symptomatically improved and PFTs stable at f/u 06/2017.   Coronary artery spasm (HCC) 07/11/2018   nitrates=HA. Amlodipine =intol swelling.  Changed to coreg  09/03/18 by cardiologist.   Depression    Endometritis 05/2017   possible; empiric tx with Flagyl  by GYN.   Femoral neck fracture (HCC)    right, 01/28/23-->ORIF   Fibromyalgia    GERD (gastroesophageal reflux disease)    Hepatic steatosis 03/2019   u/s abd   Herpes zoster 08/07/2017   L scapular region   Hiatal hernia    History of adenomatous polyp of colon 04/2008; 08/2014   No high grade dysplasia: recall 5 yrs (Dr. Dianna, [facility])   History of basal cell carcinoma excision    NOSE   History of Helicobacter pylori infection 04/2008   +gastric biopsy (gastritis but no metaplasia, dysplasia, or malignancy  identified)   History of kidney stones    History of TIA (transient ischemic attack)    secondary to HELLP syndrome 1994 post c/s--  residual memory loss   Hyperlipidemia    statin started after her NSTEMI: goal  LDL <70.   Hypertension    Ischemic cardiomyopathy 05/2018   Echo EF 45-50% in context of NSTEMI from CA spasm. 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 Magdalen 04/2017.   Memory loss    Abnl MRI brain and CT brain c/w chronic microvascular ischemia.  Repeat MRI brain 02/2014 stable (Dr. Smitty with no plan to f/u with neuro as of 04/2014)   NSTEMI (non-ST elevated myocardial infarction) (HCC) 05/2018   Echo EF 45-50% in context of NSTEMI from CA spasm.// Myoview  8/21: EF 66, no ischemia, low risk   Osteoarthritis of left wrist    STT and 1st [facility] jt   Osteoporosis 05/2010   2012 'penia; 06/2015 'porosis:  prolia .  DEXA 09/2017 T-score -2.2 femur neck. 11/2021 T score -2.2. Rpt 2 yrs   Pelvic floor dysfunction    [facility] (Dr. Cam)   Postmenopausal vaginal  bleeding 05/2017   x 1 small episode; GYN attempted endo bx but unable to penetrate cervix due to severe cerv stenosis (due to postmenopausal state + hx of LEEP).  Endo u/s showed thin endo lining.  Per GYN---no evidence of endomet pathology on w/u---obs as of 07/2017.   Prediabetes 06/2016   Fasting gluc 105; HbA1c at that time was 6.0%.  A1c 6.2% 12/2017.  A1c 5.7% Feb 2023.   Recurrent kidney stones 08/2013   Left ureteral calculus: Perc nephr--cystoscopy w/ureteroscopy + laser for stone removal.  Residual asymptomatic left renal nephrolithiasis <71mm present post-procedure.  Right sided hydronephrosis-persistent (on u/s)--urol ordered CT to further eval 08/2016: 1.6 cm nonobstructing  stone lower pole left kidney, no hydro, plan for PCN extraction ([facility])   Sepsis (HCC)    UTI   Suprapubic discomfort 06/05/2015   TMJ (temporomandibular joint disorder)    USES  MOUTH GUARD   Toe fracture, left 12/2017   2nd toe prox phalanx (immobilization, Dr. Elner)   Vitamin D deficiency 05/2011    Past Surgical History:  Procedure Laterality Date   ABIs  11/29/2019   NORMAL   ANTERIOR CERVICAL DECOMP/DISCECTOMY FUSION  06/30/2009   C3 -- C5  AND EXPLORATION OF FUSION C5-7 W/  PLATE REMOVAL   ARTHRODESIS METATARSALPHALANGEAL JOINT (MTPJ) Left 07/08/2021   Procedure: Left hallux metatarsophalangeal joint arthrodesis;  Surgeon: Kit Rush, MD;  Location: [facility];  Service: Orthopedics;  Laterality: Left;   BACK SURGERY     BUNIONECTOMY Left    CARDIOVASCULAR STRESS TEST  12/03/2019   myoc perf im: NORMAL   CERVICAL FUSION  2002   anterior C5 -- C7   CERVICAL SPINE SURGERY  08/27/1999     C6 -- T1  LAMINECTOMY/  DISKECTOMY   CESAREAN SECTION  1994   CHOLECYSTECTOMY N/A 03/06/2020   Procedure: LAPAROSCOPIC CHOLECYSTECTOMY;  Surgeon: Lyndel Deward PARAS, MD;  Location: [facility] OR;  Service: General;  Laterality: N/A;   COLONOSCOPY W/ POLYPECTOMY  04/2008;08/2014   2016 tubular adenoma x 1; +diverticulosis and int/ext hemorrhoids.  06/2020 adenoma, recall 5 yrs.   CYSTOSCOPY WITH URETEROSCOPY AND STENT PLACEMENT Left 08/28/2013   Procedure: CYSTOSCOPY, RGP,  WITH URETEROSCOPY AND STENT REMOVAL;  Surgeon: Alm GORMAN Fragmin, MD;  Location: [facility];  Service: Urology;  Laterality: Left;   DEXA  09/2017   T-score -2.2 femur neck: improved compared to 06/2015.  2021 DEXA T score -2.3.  11/2021 DEXA T score -2.2.   ESOPHAGOGASTRODUODENOSCOPY  2010; 08/2014   2016 +Candidal esophagitis; Mild chronic gastritis w/intestinal metaplasia--NEG H pylori, neg for eosinophilic esoph. 06/2020 esoph candidiasis (diflucan  x 20d), o/w normal.   HAMMER TOE SURGERY Left  07/08/2021   Procedure: Second metatarsal head excision; revision Second hammertoe correction; 2-5 percutaneus flexor tenotomies;  Surgeon: Kit Rush, MD;  Location: [facility];  Service: Orthopedics;  Laterality: Left;   HARDWARE REMOVAL Left 07/08/2021   Procedure: Removal of deep implants from hallux proximal phalanx and First and Second metatarsals;  Surgeon: Kit Rush, MD;  Location: [facility];  Service: Orthopedics;  Laterality: Left;   HIP PINNING,CANNULATED Right 01/28/2023   Procedure: PERCUTANEOUS FIXATION OF RIGHT FEMORAL NECK;  Surgeon: Sharl Selinda Dover, MD;  Location: [facility];  Service: Orthopedics;  Laterality: Right;   HOLMIUM LASER APPLICATION Left 08/28/2013   Procedure: HOLMIUM LASER APPLICATION;  Surgeon: Alm GORMAN Fragmin, MD;  Location: [facility];  Service: Urology;  Laterality: Left;   LEFT  HEART CATH AND CORONARY ANGIOGRAPHY N/A 05/15/2018   Evidence for spasm in distal LAD.  Mod RCA atherosclerosis, o/w no CAD.  Procedure: LEFT HEART CATH AND CORONARY ANGIOGRAPHY;  Surgeon: Anner Alm ORN, MD;  Location: [facility] INVASIVE CV LAB;  Service: Cardiovascular;  Laterality: N/A;   NEPHROLITHOTOMY Left 08/05/2013   Procedure: NEPHROLITHOTOMY PERCUTANEOUS;  Surgeon: Alm GORMAN Fragmin, MD;  Location: [facility];  Service: Urology;  Laterality: Left;   POLYSOMNOGRAM  01/2019   NORMAL 2008 and 2020   POLYSOMNOGRAPHY  10/25/2018   Dr. Wilbert Passey due to pt inadequat sleep time (83 min).   TONSILLECTOMY AND ADENOIDECTOMY  1975   TOTAL KNEE ARTHROPLASTY Right 2003   TOTAL KNEE REVISION Right 01/19/2022   Procedure: Right knee polyethylene revision;  Surgeon: Melodi Lerner, MD;  Location: [facility];  Service: Orthopedics;  Laterality: Right;   TRANSTHORACIC ECHOCARDIOGRAM  05/2018; 12/03/2019   (after MI secondary to arterial spasm): EF 45-50%; severe hypokinesis of apical anterolateral, inferolateral , anterior, and inferior LV  myocardium. 11/2019 EF 55-60%, valves fine   ULTRASOUND EXAM,PELVIC COMPLETE ([facility] HX)  06/25/2015   NORMAL (done by GYN)    Outpatient Medications Prior to Visit  Medication Sig Dispense Refill   albuterol  (VENTOLIN  HFA) 108 (90 Base) MCG/ACT inhaler TAKE 2 PUFFS BY MOUTH EVERY 6 HOURS AS NEEDED FOR WHEEZE OR SHORTNESS OF BREATH 18 each 0   ALPRAZolam  (XANAX ) 0.5 MG tablet TAKE 1-2 TABLETS BY MOUTH THREE TIMES A DAY AS NEEDED FOR ANXIETY 180 tablet 5   atorvastatin  (LIPITOR ) 80 MG tablet TAKE 1 TABLET BY MOUTH EVERY DAY 90 tablet 1   buPROPion  (WELLBUTRIN  XL) 300 MG 24 hr tablet TAKE 1 TABLET BY MOUTH EVERY DAY 90 tablet 1   citalopram  (CELEXA ) 40 MG tablet TAKE 1 TABLET BY MOUTH EVERY DAY 30 tablet 2   famotidine  (PEPCID ) 40 MG tablet TAKE 1 TABLET BY MOUTH EVERY DAY 90 tablet 1   fluticasone  (FLONASE ) 50 MCG/ACT nasal spray SPRAY 2 SPRAYS INTO EACH NOSTRIL EVERY DAY 48 mL 1   hydrochlorothiazide  (HYDRODIURIL ) 25 MG tablet Take 1 tablet (25 mg total) by mouth daily. 90 tablet 3   HYDROcodone -acetaminophen  (NORCO/VICODIN) 5-325 MG tablet 1-2 tab po bid as needed for pain 90 tablet 0   lamoTRIgine  (LAMICTAL ) 100 MG tablet TAKE 3 TABLETS (300 MG TOTAL) BY MOUTH AT BEDTIME 270 tablet 2   loratadine  (CLARITIN ) 10 MG tablet Take 10 mg by mouth daily.     losartan  (COZAAR ) 25 MG tablet 1 tab po bid 180 tablet 3   meloxicam  (MOBIC ) 15 MG tablet Take 15 mg by mouth daily.     metoprolol  succinate (TOPROL -XL) 100 MG 24 hr tablet 1 and 1/2 tabs p.o. daily.  Take with or immediately following a meal. 135 tablet 1   montelukast  (SINGULAIR ) 10 MG tablet TAKE 1 TABLET BY MOUTH EVERY DAY IN THE EVENING 90 tablet 1   nitroGLYCERIN  (NITROSTAT ) 0.4 MG SL tablet PLACE 1 TABLET (0.4 MG TOTAL) UNDER THE TONGUE EVERY 5 (FIVE) MINUTES X 3 DOSES AS NEEDED FOR CHEST PAIN. 25 tablet 1   ondansetron  (ZOFRAN -ODT) 4 MG disintegrating tablet Take 1 tablet (4 mg total) by mouth every 8 (eight) hours as needed. 8 tablet 0    pantoprazole  (PROTONIX ) 40 MG tablet Take 1 tablet (40 mg total) by mouth daily. PLEASE SCHEDULE F/U VISIT FOR ANY FUTURE REFILLS 90 tablet 0   potassium chloride  SA (KLOR-CON  M) 20 MEQ tablet TAKE 1 TABLET BY MOUTH EVERY  DAY 90 tablet 1   Tiotropium Bromide -Olodaterol (STIOLTO RESPIMAT ) 2.5-2.5 MCG/ACT AERS Inhale 2 each into the lungs daily. 4 g 5   No facility-administered medications prior to visit.    Allergies  Allergen Reactions   Amlodipine  Swelling   Ceftin [Cefuroxime Axetil] Anaphylaxis and Swelling    Swelling of eyes and tongue   Cipro  [Ciprofloxacin  Hcl] Anaphylaxis and Swelling   Risperdal  [Risperidone ] Palpitations    Generalized weakness Malaise  Hypertension   Fosamax  [Alendronate  Sodium] Diarrhea   Linzess [Linaclotide] Diarrhea   Morphine  And Codeine Other (See Comments)    Hallucinations Disorientation    Movantik  [Naloxegol ]     N/v   Prednisone  Other (See Comments)    Hyperactivity   Roxicodone  [Oxycodone ] Other (See Comments)    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     Review of Systems  As per HPI  PE:    12/20/2023    3:43 PM 12/20/2023    3:42 PM 12/12/2023   10:32 AM  Vitals with BMI  Height  5' 8 5' 8  Weight  182 lbs 3 oz 180 lbs  BMI  27.71 27.38  Systolic 169 179 874  Diastolic 86 76 73  Pulse  64 67   Temperature 98. O2 sat 98% on room air.  Physical Exam  General: Alert and well-appearing. Extensor aspect of left forearm and left wrist with 8-10 scattered puncture wounds without bleeding.  Significantly tender.  Mildly warm.  She has some mild soft tissue swelling over the distal ulna. Extension at the wrist is mildly limited but otherwise range of motion intact.  Range of motion of all the fingers intact.  LABS:  Last CBC Lab Results  Component Value Date   WBC 9.6 07/25/2023   HGB 10.2 (L) 07/25/2023   HCT 30.6 (L) 07/25/2023   MCV 93.0 07/25/2023   MCH 31.0  07/25/2023   RDW 13.1 07/25/2023   PLT 280 07/25/2023   Last metabolic panel Lab Results  Component Value Date   GLUCOSE 105 (H) 10/09/2023   NA 135 10/09/2023   K 4.0 10/09/2023   CL 99 10/09/2023   CO2 27 10/09/2023   BUN 25 10/09/2023   CREATININE 1.19 (H) 10/09/2023   EGFR 49 (L) 10/09/2023   CALCIUM  9.3 10/09/2023   PROT 6.3 10/09/2023   ALBUMIN  3.8 07/25/2023   BILITOT 0.4 10/09/2023   ALKPHOS 63 07/25/2023   AST 14 10/09/2023   ALT 14 10/09/2023   ANIONGAP 9 07/25/2023   IMPRESSION AND PLAN:  #1 infected cat bite. No systemic symptoms. Augmentin  875 twice daily x 10 days. High risk for infectious tenosynovitis or septic arthritis. Follow-up tomorrow afternoon for recheck.  An After Visit Summary was printed and given to the patient.  FOLLOW UP: No follow-ups on file.  Signed:  Gerlene Hockey, MD           12/20/2023

## 2023-12-21 ENCOUNTER — Encounter: Payer: Self-pay | Admitting: Family Medicine

## 2023-12-21 ENCOUNTER — Ambulatory Visit: Admitting: Family Medicine

## 2023-12-21 ENCOUNTER — Encounter: Admitting: Physical Therapy

## 2023-12-21 VITALS — BP 150/80 | HR 78 | Temp 98.0°F | Ht 68.0 in | Wt 182.2 lb

## 2023-12-21 DIAGNOSIS — L03114 Cellulitis of left upper limb: Secondary | ICD-10-CM | POA: Diagnosis not present

## 2023-12-21 DIAGNOSIS — M65932 Unspecified synovitis and tenosynovitis, left forearm: Secondary | ICD-10-CM

## 2023-12-21 DIAGNOSIS — S51852D Open bite of left forearm, subsequent encounter: Secondary | ICD-10-CM

## 2023-12-21 DIAGNOSIS — W5501XD Bitten by cat, subsequent encounter: Secondary | ICD-10-CM

## 2023-12-21 NOTE — Progress Notes (Signed)
 OFFICE VISIT  12/21/2023  CC:  Chief Complaint  Patient presents with   Follow-up    Patient is a 73 y.o. female who presents for 1 day follow-up left wrist cat bite with infection.  INTERIM HX: No change in L wrist/hand pain, redness, warmth, and swelling. No fever but says she feels malaise. She took a dose of augmentin  last night and this morning.  Past Medical History:  Diagnosis Date   Acute encephalopathy 11/19/2017   Alcoholism in remission [facility])    states last alcohol 11 yrs ago   Anemia    Anxiety    Bipolar 1 disorder (HCC)    Admission for mania x 2 (most recent 11/2017)   Cervical spondylosis without myelopathy 12/19/2013   MRI 02/2014: multilevel DDD/spondylosis, not much change compared to prior MRI.  Pt not in favor of invasive therapy for her neck as of 04/2014.   Chronic constipation    Chronic renal insufficiency, stage 3 (moderate) (HCC)    COPD (chronic obstructive pulmonary disease) (HCC)    Moderate: anoro started 04/2017 by pulm, pt symptomatically improved and PFTs stable at f/u 06/2017.   Coronary artery spasm (HCC) 07/11/2018   nitrates=HA. Amlodipine =intol swelling.  Changed to coreg  09/03/18 by cardiologist.   Depression    Endometritis 05/2017   possible; empiric tx with Flagyl  by GYN.   Femoral neck fracture (HCC)    right, 01/28/23-->ORIF   Fibromyalgia    GERD (gastroesophageal reflux disease)    Hepatic steatosis 03/2019   u/s abd   Herpes zoster 08/07/2017   L scapular region   Hiatal hernia    History of adenomatous polyp of colon 04/2008; 08/2014   No high grade dysplasia: recall 5 yrs (Dr. Dianna, [facility])   History of basal cell carcinoma excision    NOSE   History of Helicobacter pylori infection 04/2008   +gastric biopsy (gastritis but no metaplasia, dysplasia, or malignancy identified)   History of kidney stones    History of TIA (transient ischemic attack)    secondary to HELLP syndrome 1994 post c/s--  residual memory loss    Hyperlipidemia    statin started after her NSTEMI: goal  LDL <70.   Hypertension    Ischemic cardiomyopathy 05/2018   Echo EF 45-50% in context of NSTEMI from CA spasm. 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 Magdalen 04/2017.   Memory loss    Abnl MRI brain and CT brain c/w chronic microvascular ischemia.  Repeat MRI brain 02/2014 stable (Dr. Smitty with no plan to f/u with neuro as of 04/2014)   NSTEMI (non-ST elevated myocardial infarction) (HCC) 05/2018   Echo EF 45-50% in context of NSTEMI from CA spasm.// Myoview  8/21: EF 66, no ischemia, low risk   Osteoarthritis of left wrist    STT and 1st [facility] jt   Osteoporosis 05/2010   2012 'penia; 06/2015 'porosis:  prolia .  DEXA 09/2017 T-score -2.2 femur neck. 11/2021 T score -2.2. Rpt 2 yrs   Pelvic floor dysfunction    [facility] (Dr. Cam)   Postmenopausal vaginal bleeding 05/2017   x 1 small episode; GYN attempted endo bx but unable to penetrate cervix due to severe cerv stenosis (due to postmenopausal state + hx of LEEP).  Endo u/s showed thin endo  lining.  Per GYN---no evidence of endomet pathology on w/u---obs as of 07/2017.   Prediabetes 06/2016   Fasting gluc 105; HbA1c at that time was 6.0%.  A1c 6.2% 12/2017.  A1c 5.7% Feb 2023.   Recurrent kidney stones 08/2013   Left ureteral calculus: Perc nephr--cystoscopy w/ureteroscopy + laser for stone removal.  Residual asymptomatic left renal nephrolithiasis <76mm present post-procedure.  Right sided hydronephrosis-persistent (on u/s)--urol ordered CT to further eval 08/2016: 1.6 cm nonobstructing stone lower pole left kidney, no hydro, plan for PCN extraction ([facility])   Sepsis (HCC)    UTI   Suprapubic discomfort 06/05/2015   TMJ (temporomandibular  joint disorder)    USES  MOUTH GUARD   Toe fracture, left 12/2017   2nd toe prox phalanx (immobilization, Dr. Elner)   Vitamin D deficiency 05/2011    Past Surgical History:  Procedure Laterality Date   ABIs  11/29/2019   NORMAL   ANTERIOR CERVICAL DECOMP/DISCECTOMY FUSION  06/30/2009   C3 -- C5  AND EXPLORATION OF FUSION C5-7 W/  PLATE REMOVAL   ARTHRODESIS METATARSALPHALANGEAL JOINT (MTPJ) Left 07/08/2021   Procedure: Left hallux metatarsophalangeal joint arthrodesis;  Surgeon: Kit Rush, MD;  Location: [facility];  Service: Orthopedics;  Laterality: Left;   BACK SURGERY     BUNIONECTOMY Left    CARDIOVASCULAR STRESS TEST  12/03/2019   myoc perf im: NORMAL   CERVICAL FUSION  2002   anterior C5 -- C7   CERVICAL SPINE SURGERY  08/27/1999     C6 -- T1  LAMINECTOMY/  DISKECTOMY   CESAREAN SECTION  1994   CHOLECYSTECTOMY N/A 03/06/2020   Procedure: LAPAROSCOPIC CHOLECYSTECTOMY;  Surgeon: Lyndel Deward PARAS, MD;  Location: [facility];  Service: General;  Laterality: N/A;   COLONOSCOPY W/ POLYPECTOMY  04/2008;08/2014   2016 tubular adenoma x 1; +diverticulosis and int/ext hemorrhoids.  06/2020 adenoma, recall 5 yrs.   CYSTOSCOPY WITH URETEROSCOPY AND STENT PLACEMENT Left 08/28/2013   Procedure: CYSTOSCOPY, RGP,  WITH URETEROSCOPY AND STENT REMOVAL;  Surgeon: Alm GORMAN Fragmin, MD;  Location: [facility];  Service: Urology;  Laterality: Left;   DEXA  09/2017   T-score -2.2 femur neck: improved compared to 06/2015.  2021 DEXA T score -2.3.  11/2021 DEXA T score -2.2.   ESOPHAGOGASTRODUODENOSCOPY  2010; 08/2014   2016 +Candidal esophagitis; Mild chronic gastritis w/intestinal metaplasia--NEG H pylori, neg for eosinophilic esoph. 06/2020 esoph candidiasis (diflucan  x 20d), o/w normal.   HAMMER TOE SURGERY Left 07/08/2021   Procedure: Second metatarsal head excision; revision Second hammertoe correction; 2-5 percutaneus flexor tenotomies;  Surgeon: Kit Rush, MD;   Location: [facility];  Service: Orthopedics;  Laterality: Left;   HARDWARE REMOVAL Left 07/08/2021   Procedure: Removal of deep implants from hallux proximal phalanx and First and Second metatarsals;  Surgeon: Kit Rush, MD;  Location: [facility];  Service: Orthopedics;  Laterality: Left;   HIP PINNING,CANNULATED Right 01/28/2023   Procedure: PERCUTANEOUS FIXATION OF RIGHT FEMORAL NECK;  Surgeon: Sharl Selinda Dover, MD;  Location: [facility];  Service: Orthopedics;  Laterality: Right;   HOLMIUM LASER APPLICATION Left 08/28/2013   Procedure: HOLMIUM LASER APPLICATION;  Surgeon: Alm GORMAN Fragmin, MD;  Location: [facility];  Service: Urology;  Laterality: Left;   LEFT HEART CATH AND CORONARY ANGIOGRAPHY N/A 05/15/2018   Evidence for spasm in distal LAD.  Mod RCA atherosclerosis, o/w no CAD.  Procedure: LEFT HEART CATH AND CORONARY ANGIOGRAPHY;  Surgeon: Anner Alm ORN, MD;  Location: [facility];  Service: Cardiovascular;  Laterality: N/A;   NEPHROLITHOTOMY Left 08/05/2013   Procedure: NEPHROLITHOTOMY PERCUTANEOUS;  Surgeon: Alm GORMAN Fragmin, MD;  Location: [facility];  Service: Urology;  Laterality: Left;   POLYSOMNOGRAM  01/2019   NORMAL 2008 and 2020   POLYSOMNOGRAPHY  10/25/2018   Dr. Wilbert Passey due to pt inadequat sleep time (83 min).   TONSILLECTOMY AND ADENOIDECTOMY  1975   TOTAL KNEE ARTHROPLASTY Right 2003   TOTAL KNEE REVISION Right 01/19/2022   Procedure: Right knee polyethylene revision;  Surgeon: Melodi Lerner, MD;  Location: [facility];  Service: Orthopedics;  Laterality: Right;   TRANSTHORACIC ECHOCARDIOGRAM  05/2018; 12/03/2019   (after MI secondary to arterial spasm): EF 45-50%; severe hypokinesis of apical anterolateral, inferolateral , anterior, and inferior LV myocardium. 11/2019 EF 55-60%, valves fine   ULTRASOUND EXAM,PELVIC COMPLETE ([facility] HX)  06/25/2015   NORMAL (done by GYN)    Outpatient Medications Prior to  Visit  Medication Sig Dispense Refill   albuterol  (VENTOLIN  HFA) 108 (90 Base) MCG/ACT inhaler TAKE 2 PUFFS BY MOUTH EVERY 6 HOURS AS NEEDED FOR WHEEZE OR SHORTNESS OF BREATH 18 each 0   ALPRAZolam  (XANAX ) 0.5 MG tablet TAKE 1-2 TABLETS BY MOUTH THREE TIMES A DAY AS NEEDED FOR ANXIETY 180 tablet 5   amoxicillin -clavulanate (AUGMENTIN ) 875-125 MG tablet Take 1 tablet by mouth 2 (two) times daily. 20 tablet 0   atorvastatin  (LIPITOR ) 80 MG tablet TAKE 1 TABLET BY MOUTH EVERY DAY 90 tablet 1   buPROPion  (WELLBUTRIN  XL) 300 MG 24 hr tablet TAKE 1 TABLET BY MOUTH EVERY DAY 90 tablet 1   citalopram  (CELEXA ) 40 MG tablet TAKE 1 TABLET BY MOUTH EVERY DAY 30 tablet 2   famotidine  (PEPCID ) 40 MG tablet TAKE 1 TABLET BY MOUTH EVERY DAY 90 tablet 1   fluticasone  (FLONASE ) 50 MCG/ACT nasal spray SPRAY 2 SPRAYS INTO EACH NOSTRIL EVERY DAY 48 mL 1   hydrochlorothiazide  (HYDRODIURIL ) 25 MG tablet Take 1 tablet (25 mg total) by mouth daily. 90 tablet 3   HYDROcodone -acetaminophen  (NORCO/VICODIN) 5-325 MG tablet 1-2 tab po bid as needed for pain 90 tablet 0   lamoTRIgine  (LAMICTAL ) 100 MG tablet TAKE 3 TABLETS (300 MG TOTAL) BY MOUTH AT BEDTIME 270 tablet 2   loratadine  (CLARITIN ) 10 MG tablet Take 10 mg by mouth daily.     losartan  (COZAAR ) 25 MG tablet 1 tab po bid 180 tablet 3   meloxicam  (MOBIC ) 15 MG tablet Take 15 mg by mouth daily.     metoprolol  succinate (TOPROL -XL) 100 MG 24 hr tablet 1 and 1/2 tabs p.o. daily.  Take with or immediately following a meal. 135 tablet 1   montelukast  (SINGULAIR ) 10 MG tablet TAKE 1 TABLET BY MOUTH EVERY DAY IN THE EVENING 90 tablet 1   nitroGLYCERIN  (NITROSTAT ) 0.4 MG SL tablet PLACE 1 TABLET (0.4 MG TOTAL) UNDER THE TONGUE EVERY 5 (FIVE) MINUTES X 3 DOSES AS NEEDED FOR CHEST PAIN. 25 tablet 1   ondansetron  (ZOFRAN -ODT) 4 MG disintegrating tablet Take 1 tablet (4 mg total) by mouth every 8 (eight) hours as needed. 8 tablet 0   pantoprazole  (PROTONIX ) 40 MG tablet Take 1  tablet (40 mg total) by mouth daily. PLEASE SCHEDULE F/U VISIT FOR ANY FUTURE REFILLS 90 tablet 0   potassium chloride  SA (KLOR-CON  M) 20 MEQ tablet TAKE 1 TABLET BY MOUTH EVERY DAY 90 tablet 1   Tiotropium Bromide -Olodaterol (STIOLTO RESPIMAT ) 2.5-2.5 MCG/ACT AERS Inhale 2 each into the  lungs daily. 4 g 5   No facility-administered medications prior to visit.    Allergies  Allergen Reactions   Amlodipine  Swelling   Ceftin [Cefuroxime Axetil] Anaphylaxis and Swelling    Swelling of eyes and tongue   Cipro  [Ciprofloxacin  Hcl] Anaphylaxis and Swelling   Risperdal  [Risperidone ] Palpitations    Generalized weakness Malaise  Hypertension   Fosamax  [Alendronate  Sodium] Diarrhea   Linzess [Linaclotide] Diarrhea   Morphine  And Codeine Other (See Comments)    Hallucinations Disorientation    Movantik  [Naloxegol ]     N/v   Prednisone  Other (See Comments)    Hyperactivity   Roxicodone  [Oxycodone ] Other (See Comments)    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     Review of Systems As per HPI  PE:    12/21/2023    2:30 PM 12/21/2023    2:20 PM 12/20/2023    3:43 PM  Vitals with BMI  Height  5' 8   Weight  182 lbs 3 oz   BMI  27.71   Systolic 150 149 830  Diastolic 80 75 86  Pulse  78      Physical Exam  Gen: Alert, well appearing.  Patient is oriented to person, place, time, and situation. Extensor aspect of left forearm and left wrist with 8-10 scattered puncture wounds without bleeding.  This region is significantly tender and somewhat  warm.  She has some mild/mod soft tissue swelling over the ulnar aspect of the wrist and dorsum of hand and prox phalanges 3-5. Extension at the wrist is mildly limited but otherwise range of motion intact but stiff and painful.  Range of motion of all the fingers intact.  LABS:  Last CBC Lab Results  Component Value Date   WBC 9.6 07/25/2023   HGB 10.2 (L) 07/25/2023   HCT 30.6  (L) 07/25/2023   MCV 93.0 07/25/2023   MCH 31.0 07/25/2023   RDW 13.1 07/25/2023   PLT 280 07/25/2023   Last metabolic panel Lab Results  Component Value Date   GLUCOSE 105 (H) 10/09/2023   NA 135 10/09/2023   K 4.0 10/09/2023   CL 99 10/09/2023   CO2 27 10/09/2023   BUN 25 10/09/2023   CREATININE 1.19 (H) 10/09/2023   EGFR 49 (L) 10/09/2023   CALCIUM  9.3 10/09/2023   PROT 6.3 10/09/2023   ALBUMIN  3.8 07/25/2023   BILITOT 0.4 10/09/2023   ALKPHOS 63 07/25/2023   AST 14 10/09/2023   ALT 14 10/09/2023   ANIONGAP 9 07/25/2023   IMPRESSION AND PLAN:  Left wrist cat bite with soft tissue infection very concerning for tenosynovitis, lower suspicion of septic arthritis. Augmentin  no help the last 24h.  I recommend ortho eval-->arranged for tomorrow at 11 am. Continue augmentin . Signs/symptoms to go to ED for were reviewed and pt expressed understanding.  An After Visit Summary was printed and given to the patient.  FOLLOW UP: Return for TBD based on ortho eval.  Signed:  Gerlene Hockey, MD           12/21/2023

## 2023-12-21 NOTE — Patient Instructions (Addendum)
 You are scheduled with Heather Quale, PA at [facility] tomorrow at [address] AM  [address], [address], [address], [place], [place] [address]  Continue antibiotics until further instruction are given. If you begin to get a fever start to feel worse, please go to the emergency room.

## 2023-12-22 ENCOUNTER — Emergency Department (HOSPITAL_BASED_OUTPATIENT_CLINIC_OR_DEPARTMENT_OTHER)

## 2023-12-22 ENCOUNTER — Encounter (HOSPITAL_BASED_OUTPATIENT_CLINIC_OR_DEPARTMENT_OTHER): Payer: Self-pay

## 2023-12-22 ENCOUNTER — Emergency Department (HOSPITAL_BASED_OUTPATIENT_CLINIC_OR_DEPARTMENT_OTHER)
Admission: EM | Admit: 2023-12-22 | Discharge: 2023-12-22 | Disposition: A | Discharge status: Home / Self Care | Attending: Emergency Medicine | Admitting: Emergency Medicine

## 2023-12-22 ENCOUNTER — Other Ambulatory Visit: Payer: Self-pay

## 2023-12-22 DIAGNOSIS — M7989 Other specified soft tissue disorders: Secondary | ICD-10-CM | POA: Diagnosis present

## 2023-12-22 DIAGNOSIS — E871 Hypo-osmolality and hyponatremia: Secondary | ICD-10-CM | POA: Diagnosis not present

## 2023-12-22 DIAGNOSIS — Z79899 Other long term (current) drug therapy: Secondary | ICD-10-CM | POA: Insufficient documentation

## 2023-12-22 DIAGNOSIS — I10 Essential (primary) hypertension: Secondary | ICD-10-CM | POA: Insufficient documentation

## 2023-12-22 DIAGNOSIS — M19042 Primary osteoarthritis, left hand: Secondary | ICD-10-CM | POA: Diagnosis not present

## 2023-12-22 DIAGNOSIS — S51852D Open bite of left forearm, subsequent encounter: Secondary | ICD-10-CM | POA: Diagnosis not present

## 2023-12-22 DIAGNOSIS — J449 Chronic obstructive pulmonary disease, unspecified: Secondary | ICD-10-CM | POA: Insufficient documentation

## 2023-12-22 DIAGNOSIS — M1812 Unilateral primary osteoarthritis of first carpometacarpal joint, left hand: Secondary | ICD-10-CM | POA: Diagnosis not present

## 2023-12-22 DIAGNOSIS — L03114 Cellulitis of left upper limb: Secondary | ICD-10-CM | POA: Diagnosis not present

## 2023-12-22 DIAGNOSIS — W5501XD Bitten by cat, subsequent encounter: Secondary | ICD-10-CM | POA: Diagnosis not present

## 2023-12-22 DIAGNOSIS — Z87891 Personal history of nicotine dependence: Secondary | ICD-10-CM | POA: Diagnosis not present

## 2023-12-22 LAB — CBC WITH DIFFERENTIAL/PLATELET
Abs Immature Granulocytes: 0.02 K/uL (ref 0.00–0.07)
Basophils Absolute: 0 K/uL (ref 0.0–0.1)
Basophils Relative: 1 %
Eosinophils Absolute: 0.2 K/uL (ref 0.0–0.5)
Eosinophils Relative: 3 %
HCT: 30.5 % — ABNORMAL LOW (ref 36.0–46.0)
Hemoglobin: 10.6 g/dL — ABNORMAL LOW (ref 12.0–15.0)
Immature Granulocytes: 0 %
Lymphocytes Relative: 17 %
Lymphs Abs: 1.1 K/uL (ref 0.7–4.0)
MCH: 30.5 pg (ref 26.0–34.0)
MCHC: 34.8 g/dL (ref 30.0–36.0)
MCV: 87.9 fL (ref 80.0–100.0)
Monocytes Absolute: 0.5 K/uL (ref 0.1–1.0)
Monocytes Relative: 8 %
Neutro Abs: 4.7 K/uL (ref 1.7–7.7)
Neutrophils Relative %: 71 %
Platelets: 265 K/uL (ref 150–400)
RBC: 3.47 MIL/uL — ABNORMAL LOW (ref 3.87–5.11)
RDW: 12.3 % (ref 11.5–15.5)
WBC: 6.6 K/uL (ref 4.0–10.5)
nRBC: 0 % (ref 0.0–0.2)

## 2023-12-22 LAB — COMPREHENSIVE METABOLIC PANEL WITH GFR
ALT: 19 U/L (ref 0–44)
AST: 20 U/L (ref 15–41)
Albumin: 4.6 g/dL (ref 3.5–5.0)
Alkaline Phosphatase: 70 U/L (ref 38–126)
Anion gap: 14 (ref 5–15)
BUN: 15 mg/dL (ref 8–23)
CO2: 22 mmol/L (ref 22–32)
Calcium: 9.7 mg/dL (ref 8.9–10.3)
Chloride: 98 mmol/L (ref 98–111)
Creatinine, Ser: 1 mg/dL (ref 0.44–1.00)
GFR, Estimated: >60 mL/min (ref >60)
Glucose, Bld: 109 mg/dL — ABNORMAL HIGH (ref 70–99)
Potassium: 4.5 mmol/L (ref 3.5–5.1)
Sodium: 134 mmol/L — ABNORMAL LOW (ref 135–145)
Total Bilirubin: 0.5 mg/dL (ref 0.0–1.2)
Total Protein: 6.8 g/dL (ref 6.5–8.1)

## 2023-12-22 MED ORDER — CLINDAMYCIN HCL 150 MG PO CAPS
300.0000 mg | ORAL_CAPSULE | Freq: Once | ORAL | Status: AC
  Administered 2023-12-22: 300 mg via ORAL
  Filled 2023-12-22: qty 2

## 2023-12-22 MED ORDER — KETOROLAC TROMETHAMINE 30 MG/ML IJ SOLN
30.0000 mg | Freq: Once | INTRAMUSCULAR | Status: AC
  Administered 2023-12-22: 30 mg via INTRAMUSCULAR
  Filled 2023-12-22: qty 1

## 2023-12-22 MED ORDER — DOXYCYCLINE HYCLATE 100 MG PO CAPS: 100.0000 mg | ORAL_CAPSULE | Freq: Two times a day (BID) | ORAL | 0 refills | Status: DC

## 2023-12-22 NOTE — ED Provider Notes (Signed)
 [place] EMERGENCY DEPARTMENT AT [facility] Provider Note   CSN: [id number] Arrival date & time: 12/22/23  1512     Patient presents with: Animal Bite   Erin Lopez is a 73 y.o. female.    Animal Bite   73 year old female presents emergency department after a cat bite.  Cat bite occurred on Wednesday.  Was seen by primary care at that time and was placed on Augmentin .  Reports 2 days of antibiotics.  States that cat is up-to-date on vaccine is not concerned about rabies.  Patient states that she is up-to-date on Tdap.  States that the redness as well as swelling has improved but wanted to be reevaluated as she has had sepsis in the past and is fearful of the same.  Denies any fevers, chills, weakness or sensory deficits in affected hand.  Does report some swelling over the area.  Past medical history significant for hypertension, bipolar 1 disorder, hyperlipidemia, COPD, fibromyalgia, osteoporosis, TIA, NSTEMI, ischemic cardiomyopathy  Prior to Admission medications   Medication Sig Start Date End Date Taking? Authorizing Provider  doxycycline  (VIBRAMYCIN ) 100 MG capsule Take 1 capsule (100 mg total) by mouth 2 (two) times daily. 12/22/23  Yes Silver Fell A, PA  albuterol  (VENTOLIN  HFA) 108 (90 Base) MCG/ACT inhaler TAKE 2 PUFFS BY MOUTH EVERY 6 HOURS AS NEEDED FOR WHEEZE OR SHORTNESS OF BREATH 12/19/23   McGowen, Aleene DEL, MD  ALPRAZolam  (XANAX ) 0.5 MG tablet TAKE 1-2 TABLETS BY MOUTH THREE TIMES A DAY AS NEEDED FOR ANXIETY 09/01/23   McGowen, Aleene DEL, MD  amoxicillin -clavulanate (AUGMENTIN ) 875-125 MG tablet Take 1 tablet by mouth 2 (two) times daily. 12/20/23   McGowen, Aleene DEL, MD  atorvastatin  (LIPITOR ) 80 MG tablet TAKE 1 TABLET BY MOUTH EVERY DAY 08/03/23   McGowen, Aleene DEL, MD  buPROPion  (WELLBUTRIN  XL) 300 MG 24 hr tablet TAKE 1 TABLET BY MOUTH EVERY DAY 12/13/23   McGowen, Aleene DEL, MD  citalopram  (CELEXA ) 40 MG tablet TAKE 1 TABLET BY MOUTH EVERY DAY 12/13/23    McGowen, Aleene DEL, MD  famotidine  (PEPCID ) 40 MG tablet TAKE 1 TABLET BY MOUTH EVERY DAY 08/03/23   McGowen, Aleene DEL, MD  fluticasone  (FLONASE ) 50 MCG/ACT nasal spray SPRAY 2 SPRAYS INTO EACH NOSTRIL EVERY DAY 08/29/23   McGowen, Aleene DEL, MD  hydrochlorothiazide  (HYDRODIURIL ) 25 MG tablet Take 1 tablet (25 mg total) by mouth daily. 10/16/23   McGowen, Aleene DEL, MD  HYDROcodone -acetaminophen  (NORCO/VICODIN) 5-325 MG tablet 1-2 tab po bid as needed for pain 08/05/23   McGowen, Aleene DEL, MD  lamoTRIgine  (LAMICTAL ) 100 MG tablet TAKE 3 TABLETS (300 MG TOTAL) BY MOUTH AT BEDTIME 06/23/23   McGowen, Aleene DEL, MD  loratadine  (CLARITIN ) 10 MG tablet Take 10 mg by mouth daily.    [provider]  losartan  (COZAAR ) 25 MG tablet 1 tab po bid 08/16/23   McGowen, Aleene DEL, MD  meloxicam  (MOBIC ) 15 MG tablet Take 15 mg by mouth daily. 12/18/23   [provider]  metoprolol  succinate (TOPROL -XL) 100 MG 24 hr tablet 1 and 1/2 tabs p.o. daily.  Take with or immediately following a meal. 08/30/23   McGowen, Aleene DEL, MD  montelukast  (SINGULAIR ) 10 MG tablet TAKE 1 TABLET BY MOUTH EVERY DAY IN THE EVENING 12/13/23   McGowen, Aleene DEL, MD  nitroGLYCERIN  (NITROSTAT ) 0.4 MG SL tablet PLACE 1 TABLET (0.4 MG TOTAL) UNDER THE TONGUE EVERY 5 (FIVE) MINUTES X 3 DOSES AS NEEDED FOR CHEST PAIN. 07/03/23  McGowen, Aleene DEL, MD  ondansetron  (ZOFRAN -ODT) 4 MG disintegrating tablet Take 1 tablet (4 mg total) by mouth every 8 (eight) hours as needed. 07/25/23   Patsey Lot, MD  pantoprazole  (PROTONIX ) 40 MG tablet Take 1 tablet (40 mg total) by mouth daily. PLEASE SCHEDULE F/U VISIT FOR ANY FUTURE REFILLS 12/13/23   Pyrtle, Gordy HERO, MD  potassium chloride  SA (KLOR-CON  M) 20 MEQ tablet TAKE 1 TABLET BY MOUTH EVERY DAY 08/03/23   McGowen, Aleene DEL, MD  Tiotropium Bromide -Olodaterol (STIOLTO RESPIMAT ) 2.5-2.5 MCG/ACT AERS Inhale 2 each into the lungs daily. 07/07/23   Hope Almarie ORN, NP    Allergies: Amlodipine , Ceftin  [cefuroxime axetil], Cipro  [ciprofloxacin  hcl], Risperdal  [risperidone ], Fosamax  [alendronate  sodium], Linzess [linaclotide], Morphine  and codeine, Movantik  [naloxegol ], Prednisone , Roxicodone  [oxycodone ], and Seroquel  [quetiapine ]    Review of Systems  All other systems reviewed and are negative.   Updated Vital Signs BP (!) 165/81   Pulse 78   Temp 97.8 F (36.6 C) (Oral)   Resp 14   SpO2 97%   Physical Exam Vitals and nursing note reviewed.  Constitutional:      General: She is not in acute distress.    Appearance: She is well-developed.  HENT:     Head: Normocephalic and atraumatic.  Eyes:     Conjunctiva/sclera: Conjunctivae normal.  Cardiovascular:     Rate and Rhythm: Normal rate and regular rhythm.     Heart sounds: No murmur heard. Pulmonary:     Effort: Pulmonary effort is normal. No respiratory distress.     Breath sounds: Normal breath sounds.  Abdominal:     Palpations: Abdomen is soft.     Tenderness: There is no abdominal tenderness.  Musculoskeletal:        General: No swelling.     Cervical back: Neck supple.  Skin:    General: Skin is warm and dry.     Capillary Refill: Capillary refill takes less than 2 seconds.     Comments: Multiple puncture wounds and abrasions left forearm distally.  Erythema, warmth palpated dorsal laterally left forearm.  Radial pulses 2+ bilaterally.  Full range of motion bilateral upper extremities.  Neurological:     Mental Status: She is alert.  Psychiatric:        Mood and Affect: Mood normal.     (all labs ordered are listed, but only abnormal results are displayed) Labs Reviewed  COMPREHENSIVE METABOLIC PANEL WITH GFR - Abnormal; Notable for the following components:      Result Value   Sodium 134 (*)    Glucose, Bld 109 (*)    All other components within normal limits  CBC WITH DIFFERENTIAL/PLATELET - Abnormal; Notable for the following components:   RBC 3.47 (*)    Hemoglobin 10.6 (*)    HCT 30.5 (*)    All  other components within normal limits    EKG: None  Radiology: DG Wrist Complete Left Result Date: 12/22/2023 CLINICAL DATA:  Cat bite. EXAM: LEFT WRIST - COMPLETE 3+ VIEW COMPARISON:  September 30, 2021. FINDINGS: No definite fracture is noted. Severe degenerative changes seen involving the first carpometacarpal joint. Moderate degenerative changes seen involving the first metacarpophalangeal joint. IMPRESSION: Severe osteoarthritis of first carpometacarpal joint. Moderate osteoarthritis of first metacarpophalangeal joint. No acute abnormality seen. Electronically Signed   By: Lynwood Landy Raddle M.D.   On: 12/22/2023 15:42     Procedures   Medications Ordered in the ED  ketorolac  (TORADOL ) 30 MG/ML injection 30 mg (30 mg  Intramuscular Given 12/22/23 2051)  clindamycin  (CLEOCIN ) capsule 300 mg (300 mg Oral Given 12/22/23 2050)                                    Medical Decision Making Amount and/or Complexity of Data Reviewed Labs: ordered. Radiology: ordered.  Risk Prescription drug management.   This patient presents to the ED for concern of cat bite, this involves an extensive number of treatment options, and is a complaint that carries with it a high risk of complications and morbidity.  The differential diagnosis includes cellulitis, abscess, necrotizing infection, sepsis, other   Co morbidities that complicate the patient evaluation  See HPI   Additional history obtained:  Additional history obtained from EMR External records from outside source obtained and reviewed including hospital records   Lab Tests:  I Ordered, and personally interpreted labs.  The pertinent results include: No leukocytosis.  Baseline anemia hemoglobin of 10.6.  Platelets within range.  Mild hyponatremia of 134 otherwise, electrolytes within normal limits.  No transaminitis.  No renal dysfunction.   Imaging Studies ordered:  I ordered imaging studies including left wrist x-ray I independently  visualized and interpreted imaging which showed negative.  Soft tissue swelling. I agree with the radiologist interpretation   Cardiac Monitoring: / EKG:  N/a   Consultations Obtained:  N/a   Problem List / ED Course / Critical interventions / Medication management  Cat bite, cellulitis I ordered medication including clindamycin   Reevaluation of the patient after these medicines showed that the patient improved I have reviewed the patients home medicines and have made adjustments as needed   Social Determinants of Health:  Former cigarette use.  Denies illicit drug use.   Test / Admission - Considered:  Cat bite, cellulitis Vitals signs significant for hypertension, patient has not taken her antihypertensive medications this evening. Otherwise within normal range and stable throughout visit. Laboratory/imaging studies significant for: See above  73 year old female presents emergency department after a cat bite.  Cat bite occurred on Wednesday.  Was seen by primary care at that time and was placed on Augmentin .  Reports 2 days of antibiotics.  States that cat is up-to-date on vaccine is not concerned about rabies.  Patient states that she is up-to-date on Tdap.  States that the redness as well as swelling has improved but wanted to be reevaluated as she has had sepsis in the past and is fearful of the same.  Denies any fevers, chills, weakness or sensory deficits in affected hand.  Does report some swelling over the area. On exam, erythema appreciated distal dorsal lateral left forearm with multiple abrasions/healing puncture wounds present.  No evidence clinically of abscess.  Area of erythema has improved per patient since she was placed on the antibiotics.  No evidence of sepsis per vital signs and normal white count.  Given improvement of the redness, patient elected for continuing oral antibiotics at home and following up with primary care for reassessment.  This seemed  reasonable.  Treatment plan discussed with patient she acknowledged understanding was agreeable.  Patient well-appearing, afebrile in no acute distress upon discharge. Worrisome signs and symptoms were discussed with the patient, and the patient acknowledged understanding to return to the ED if noticed. Patient was stable upon discharge.       Final diagnoses:  None          Silver Wonda LABOR, PA 12/22/23 2108  Lenor Hollering, MD 12/22/23 2308

## 2023-12-22 NOTE — ED Triage Notes (Signed)
 Pt c/o animal bite- my cat bit me Wednesday, it's real hot/ inflamed. Advises she's been to PCP twice for same. No fevers at home, states fever's just in my arm. Redness to L wrist area, limited ROM r/t swelling.  Augmentin  since Wednesday, advises she feels it's not better.

## 2023-12-22 NOTE — Discharge Instructions (Addendum)
 As discussed, we will add an antibiotic onto the Augmentin  you are already taking.  Recommend close follow-up with outpatient provider within next few days.  Return if you develop any fever, worsening redness or swelling or other abnormality we discussed.

## 2023-12-25 ENCOUNTER — Encounter: Admitting: Physical Therapy

## 2023-12-28 ENCOUNTER — Encounter: Admitting: Physical Therapy

## 2024-01-01 ENCOUNTER — Encounter: Payer: Self-pay | Admitting: Physical Therapy

## 2024-01-01 ENCOUNTER — Other Ambulatory Visit: Payer: Self-pay | Admitting: Family Medicine

## 2024-01-01 ENCOUNTER — Ambulatory Visit: Admitting: Physical Therapy

## 2024-01-01 DIAGNOSIS — R2689 Other abnormalities of gait and mobility: Secondary | ICD-10-CM | POA: Diagnosis not present

## 2024-01-01 DIAGNOSIS — F4323 Adjustment disorder with mixed anxiety and depressed mood: Secondary | ICD-10-CM

## 2024-01-01 DIAGNOSIS — F319 Bipolar disorder, unspecified: Secondary | ICD-10-CM

## 2024-01-01 DIAGNOSIS — M25551 Pain in right hip: Secondary | ICD-10-CM | POA: Diagnosis not present

## 2024-01-01 DIAGNOSIS — F411 Generalized anxiety disorder: Secondary | ICD-10-CM

## 2024-01-01 NOTE — Therapy (Signed)
 OUTPATIENT PHYSICAL THERAPY LOWER EXTREMITY TREATMENT   Patient Name: JORDANA DUGUE MRN: [id number] DOB:06-Nov-1950, 73 y.o., female Today's Date: 01/01/2024  END OF SESSION:  PT End of Session - 01/01/24 1420     Visit Number 9    Number of Visits 16    Date for Recertification  02/12/24    Authorization Type [employer], rcert done at visit 8    PT Start Time 1353    PT Stop Time 1432    PT Time Calculation (min) 39 min    Activity Tolerance Patient tolerated treatment well    Behavior During Therapy [facility] for tasks assessed/performed              Past Medical History:  Diagnosis Date   Acute encephalopathy 11/19/2017   Alcoholism in remission (HCC)    states last alcohol 11 yrs ago   Anemia    Anxiety    Bipolar 1 disorder (HCC)    Admission for mania x 2 (most recent 11/2017)   Cervical spondylosis without myelopathy 12/19/2013   MRI 02/2014: multilevel DDD/spondylosis, not much change compared to prior MRI.  Pt not in favor of invasive therapy for her neck as of 04/2014.   Chronic constipation    Chronic renal insufficiency, stage 3 (moderate)    COPD (chronic obstructive pulmonary disease) (HCC)    Moderate: anoro started 04/2017 by pulm, pt symptomatically improved and PFTs stable at f/u 06/2017.   Coronary artery spasm 07/11/2018   nitrates=HA. Amlodipine =intol swelling.  Changed to coreg  09/03/18 by cardiologist.   Depression    Endometritis 05/2017   possible; empiric tx with Flagyl  by GYN.   Femoral neck fracture (HCC)    right, 01/28/23-->ORIF   Fibromyalgia    GERD (gastroesophageal reflux disease)    Hepatic steatosis 03/2019   u/s abd   Herpes zoster 08/07/2017   L scapular region   Hiatal hernia    History of adenomatous polyp of colon 04/2008; 08/2014   No high grade dysplasia: recall 5 yrs (Dr. Dianna, [facility])   History of basal cell carcinoma excision    NOSE   History of Helicobacter pylori infection 04/2008   +gastric biopsy  (gastritis but no metaplasia, dysplasia, or malignancy identified)   History of kidney stones    History of TIA (transient ischemic attack)    secondary to HELLP syndrome 1994 post c/s--  residual memory loss   Hyperlipidemia    statin started after her NSTEMI: goal  LDL <70.   Hypertension    Ischemic cardiomyopathy 05/2018   Echo EF 45-50% in context of NSTEMI from CA spasm. 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 Magdalen 04/2017.   Memory loss    Abnl MRI brain and CT brain c/w chronic microvascular ischemia.  Repeat MRI brain 02/2014 stable (Dr. Smitty with no plan to f/u with neuro as of 04/2014)   NSTEMI (non-ST elevated myocardial infarction) (HCC) 05/2018   Echo EF 45-50% in context of NSTEMI from CA spasm.// Myoview  8/21: EF 66, no ischemia, low risk   Osteoarthritis of left wrist    STT and 1st [facility] jt   Osteoporosis 05/2010   2012 'penia; 06/2015 'porosis:  prolia .  DEXA 09/2017 T-score -2.2 femur neck. 11/2021 T score -2.2. Rpt 2 yrs  Pelvic floor dysfunction    [facility] (Dr. Cam)   Postmenopausal vaginal bleeding 05/2017   x 1 small episode; GYN attempted endo bx but unable to penetrate cervix due to severe cerv stenosis (due to postmenopausal state + hx of LEEP).  Endo u/s showed thin endo lining.  Per GYN---no evidence of endomet pathology on w/u---obs as of 07/2017.   Prediabetes 06/2016   Fasting gluc 105; HbA1c at that time was 6.0%.  A1c 6.2% 12/2017.  A1c 5.7% Feb 2023.   Recurrent kidney stones 08/2013   Left ureteral calculus: Perc nephr--cystoscopy w/ureteroscopy + laser for stone removal.  Residual asymptomatic left renal nephrolithiasis <48mm present post-procedure.  Right sided hydronephrosis-persistent (on u/s)--urol  ordered CT to further eval 08/2016: 1.6 cm nonobstructing stone lower pole left kidney, no hydro, plan for PCN extraction ([facility])   Sepsis (HCC)    UTI   Suprapubic discomfort 06/05/2015   TMJ (temporomandibular joint disorder)    USES  MOUTH GUARD   Toe fracture, left 12/2017   2nd toe prox phalanx (immobilization, Dr. Elner)   Vitamin D deficiency 05/2011   Past Surgical History:  Procedure Laterality Date   ABIs  11/29/2019   NORMAL   ANTERIOR CERVICAL DECOMP/DISCECTOMY FUSION  06/30/2009   C3 -- C5  AND EXPLORATION OF FUSION C5-7 W/  PLATE REMOVAL   ARTHRODESIS METATARSALPHALANGEAL JOINT (MTPJ) Left 07/08/2021   Procedure: Left hallux metatarsophalangeal joint arthrodesis;  Surgeon: Kit Rush, MD;  Location: [facility];  Service: Orthopedics;  Laterality: Left;   BACK SURGERY     BUNIONECTOMY Left    CARDIOVASCULAR STRESS TEST  12/03/2019   myoc perf im: NORMAL   CERVICAL FUSION  2002   anterior C5 -- C7   CERVICAL SPINE SURGERY  08/27/1999     C6 -- T1  LAMINECTOMY/  DISKECTOMY   CESAREAN SECTION  1994   CHOLECYSTECTOMY N/A 03/06/2020   Procedure: LAPAROSCOPIC CHOLECYSTECTOMY;  Surgeon: Lyndel Deward PARAS, MD;  Location: [facility] OR;  Service: General;  Laterality: N/A;   COLONOSCOPY W/ POLYPECTOMY  04/2008;08/2014   2016 tubular adenoma x 1; +diverticulosis and int/ext hemorrhoids.  06/2020 adenoma, recall 5 yrs.   CYSTOSCOPY WITH URETEROSCOPY AND STENT PLACEMENT Left 08/28/2013   Procedure: CYSTOSCOPY, RGP,  WITH URETEROSCOPY AND STENT REMOVAL;  Surgeon: Alm GORMAN Fragmin, MD;  Location: [facility];  Service: Urology;  Laterality: Left;   DEXA  09/2017   T-score -2.2 femur neck: improved compared to 06/2015.  2021 DEXA T score -2.3.  11/2021 DEXA T score -2.2.   ESOPHAGOGASTRODUODENOSCOPY  2010; 08/2014   2016 +Candidal esophagitis; Mild chronic gastritis w/intestinal metaplasia--NEG H pylori, neg for eosinophilic esoph. 06/2020 esoph candidiasis  (diflucan  x 20d), o/w normal.   HAMMER TOE SURGERY Left 07/08/2021   Procedure: Second metatarsal head excision; revision Second hammertoe correction; 2-5 percutaneus flexor tenotomies;  Surgeon: Kit Rush, MD;  Location: [facility];  Service: Orthopedics;  Laterality: Left;   HARDWARE REMOVAL Left 07/08/2021   Procedure: Removal of deep implants from hallux proximal phalanx and First and Second metatarsals;  Surgeon: Kit Rush, MD;  Location:  SURGERY CENTER;  Service: Orthopedics;  Laterality: Left;   HIP PINNING,CANNULATED Right 01/28/2023   Procedure: PERCUTANEOUS FIXATION OF RIGHT FEMORAL NECK;  Surgeon: Sharl Selinda Dover, MD;  Location: [facility] OR;  Service: Orthopedics;  Laterality: Right;   HOLMIUM LASER APPLICATION Left 08/28/2013   Procedure: HOLMIUM LASER APPLICATION;  Surgeon: Alm GORMAN Fragmin, MD;  Location:  [facility];  Service: Urology;  Laterality: Left;   LEFT HEART CATH AND CORONARY ANGIOGRAPHY N/A 05/15/2018   Evidence for spasm in distal LAD.  Mod RCA atherosclerosis, o/w no CAD.  Procedure: LEFT HEART CATH AND CORONARY ANGIOGRAPHY;  Surgeon: Anner Alm ORN, MD;  Location: [facility] INVASIVE CV LAB;  Service: Cardiovascular;  Laterality: N/A;   NEPHROLITHOTOMY Left 08/05/2013   Procedure: NEPHROLITHOTOMY PERCUTANEOUS;  Surgeon: Alm GORMAN Fragmin, MD;  Location: [facility];  Service: Urology;  Laterality: Left;   POLYSOMNOGRAM  01/2019   NORMAL 2008 and 2020   POLYSOMNOGRAPHY  10/25/2018   Dr. Wilbert Passey due to pt inadequat sleep time (83 min).   TONSILLECTOMY AND ADENOIDECTOMY  1975   TOTAL KNEE ARTHROPLASTY Right 2003   TOTAL KNEE REVISION Right 01/19/2022   Procedure: Right knee polyethylene revision;  Surgeon: Melodi Lerner, MD;  Location: [facility];  Service: Orthopedics;  Laterality: Right;   TRANSTHORACIC ECHOCARDIOGRAM  05/2018; 12/03/2019   (after MI secondary to arterial spasm): EF 45-50%; severe hypokinesis of apical  anterolateral, inferolateral , anterior, and inferior LV myocardium. 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   Healthcare maintenance 04/19/2019   Overweight (BMI 25.0-29.9) 02/19/2019   Ischemic cardiomyopathy 09/03/2018   Coronary vasospasm 07/11/2018   NSTEMI (non-ST elevated myocardial infarction) (HCC) 05/14/2018   Hyponatremia 11/16/2017   Bipolar 1 disorder (HCC) 12/29/2016   Hypertension 12/29/2016   History of TIA (transient ischemic attack) 12/29/2016   Chronic pelvic pain in female 08/22/2016   Stage 2 chronic kidney disease 08/22/2016   Osteoporosis 09/30/2015   Health maintenance examination 06/11/2015   Urethral caruncle 06/05/2015   UTI (urinary tract infection) 06/05/2015   TMJ (temporomandibular joint disorder) 02/25/2015   Right ankle injury 11/05/2014   Cervical spondylosis without myelopathy 12/19/2013   Sprain of neck 12/19/2013   Memory difficulties 12/19/2013   Postnasal drip 10/28/2013   Constipation, chronic 07/29/2013   COPD (chronic obstructive pulmonary disease) (HCC) 06/30/2013   Kidney stone on left side 05/29/2013   Hyperlipidemia 05/29/2013     PCP: Phiip McGowen  REFERRING PROVIDER: Phiip McGowen  REFERRING DIAG: R hip pain   THERAPY DIAG:  No diagnosis found.  Rationale for Evaluation and Treatment: Rehabilitation  ONSET DATE:  01/28/23   SUBJECTIVE:   SUBJECTIVE STATEMENT: Pt  has not made appt for surgery yet, still notes discomfort in hip  with activity   Eval: Pt had fall, suffering R femur fracture on 01/28/23 last year.  She had fixation of femoral neck by  Dr. Sharl. States difficulty in hospital and recovering from this, had sepsis, etc. She had difficulty with mobility after this, did not have much PT, had some home heath. States R hip still sore, notes pain in lateral hip with activity and walking.  She also had another fall in April at home, fell hard, broke nose, hurt face, concussion and broke R wrist. Still wearing brace on R hand, but reports wrist doing well at this time.  She notes difficulty with BP lately, being high and low. States difficulty with motivation lately, and feels like she has  no energy- MD aware.  Reports sleeping well at night.  Lives alone, does have life alert and does drive.  States she feels something moving in hip when she turns her leg a certain direction ( S/L Hip er/ir)    PERTINENT HISTORY: CKD, HTN,  TIA,  COPD, Osteoporosis, Bipolar,  R TKA and revision  PAIN:  Are you having pain? Yes: NPRS scale: up to 5/10 Pain location: R lateral hip Pain description: sore Aggravating factors: stairs, moving sideways,  Relieving factors: rest/sitting   PRECAUTIONS: Fall  WEIGHT BEARING RESTRICTIONS: No  FALLS:  Has patient fallen in last 6 months? Yes. Number of falls 2 - broke R wrist/outside got out of car,     PLOF: Independent   PATIENT GOALS:  Decreased pain in hip, increased activity level.   NEXT MD VISIT:   OBJECTIVE:   DIAGNOSTIC FINDINGS:   PATIENT SURVEYS:   COGNITION: Overall cognitive status: Within functional limits for tasks assessed     SENSATION: WFL  EDEMA: n/a   POSTURE:    No Significant postural limitations  PALPATION: Tenderness at lateral R hip, and gr trochanter   LOWER EXTREMITY ROM: Hip flex, IR, ER- mild limitations Knees: WFL   LOWER EXTREMITY MMT:  MMT Left eval Right  eval 11/27/2023 Right 12/18/23 Right  Hip flexion 4+ 4- 4  4  Hip extension      Hip abduction  3/pain 4- 4- (crepitus)   Hip adduction      Hip internal rotation      Hip external  rotation 4+ 4 4 4+  Knee flexion 5 4+  5  Knee extension 5 4  4+  Ankle dorsiflexion      Ankle plantarflexion      Ankle inversion      Ankle eversion       (Blank rows = not tested)   LOWER EXTREMITY SPECIAL TESTS:    FUNCTIONAL TESTS:     GAIT: Distance walked: 150 Assistive device utilized: None Level of assistance: Complete Independence Comments: R valgus knee, decreased knee extension and heel strike on R.    TODAY'S TREATMENT:                                                                                                                              DATE:   01/01/2024 Therapeutic Exercise: Aerobic:  Bike L1, slow x 5 min Supine:    Bridging 2 x 10 ;  Clams GTB x 15;   Seated:     S/L:   Standing:   hip abd 2 x 10 bil   Stretches:  Neuromuscular Re-education:  Manual Therapy: Therapeutic Activity:   mini squats at mat table 2 x 10 with cueing for mechanics.  Sit to stand x 10;  Side stepping  at counter 3 x 10;   Self Care: education and discussion on state of hip and summary of previous MD appt. Discussed referral to behavior health  for talking through pts concerns on insurance, aging, finances, and feeling down more lately.     Therapeutic Exercise: Aerobic: Supine:   SLR 2 x 10 bil( tried slight Er on R- harder/slight pain)     hip ER GTB x 20;  Seated:  LAQ 3 lb x 20 on R;  S/L: trial hip abd- grinding in hip  Standing:  squats at counter x 12;  at table x 10 ( cueing for hip and knee placement )   Hip abd 3 x 5 on R;   side stepping at counter x 10 Stretches:  Neuromuscular Re-education: Manual Therapy: Therapeutic Activity:    Self Care:   Ther ex:  Supine: SLR 2 x 10 on R;  Hip clams GTB x 15;(sore by end) ;  LAQ x 15 on L;    PATIENT EDUCATION:  Education details: updated and reviewed HEP Person educated: Patient Education method: Explanation, Demonstration, Tactile cues, Verbal cues, and Handouts Education comprehension: verbalized  understanding, returned demonstration, verbal cues required, tactile cues required, and needs further education   HOME EXERCISE PROGRAM: Access Code: [email or web address] URL: [email or web address] Date: 10/13/2023 Prepared by: Tinnie Don Exercises - Straight Leg Raise  - 1 x daily - 2 sets - 5- 10 reps - Seated Knee Extension AROM  - 2 x daily - 2 sets - 10 reps  ASSESSMENT:  CLINICAL IMPRESSION: Pt challenged with strengthening. Encouraged more frequent HEP for improving strength. Pt expresses concern and need for advice on several topics today, pt agreeable to behavior health referral. Will contact pts PCP. Pt will benefit from continued care for strength and management of LE pain.    Eval: Patient presents with primary complaint of  pain in R hip. She had previous fracture with surgery last year, and continues to have pain and deficit since. She has weakness in R hip and increased pain with movement. She is overall deconditioned and would benefit from increasing mobility. She also has difficulty with balance and has had a few falls with injury. She will benefit from further evaluation of balance at future visits, and treatment of this as needed, to prevent further falls. She has decreased gait mechanics, with valgus R knee and decreased heel strike/short stride on R.  Pt with decreased ability for full functional activities. Pt will  benefit from skilled PT to improve deficits and pain and to return to PLOF.   OBJECTIVE IMPAIRMENTS: Abnormal gait, decreased activity tolerance, decreased coordination, decreased mobility, difficulty walking, decreased ROM, decreased strength, impaired flexibility, impaired vision/preception, improper body mechanics, and pain.   ACTIVITY LIMITATIONS: standing, squatting, stairs, transfers, and locomotion level  PARTICIPATION LIMITATIONS: meal prep, cleaning, laundry, driving, shopping, and community activity  PERSONAL FACTORS: Past/current  experiences, Time since onset of injury/illness/exacerbation, and 1 comorbidity: surgery are also affecting patient's functional outcome.   REHAB POTENTIAL: Good  CLINICAL DECISION MAKING: Evolving/moderate complexity  EVALUATION COMPLEXITY: Moderate   GOALS: Goals reviewed with patient? Yes  SHORT TERM GOALS: Target date: 11/03/2023   Pt to be independent with initial HEP  Goal status: MET  2.  Pt to demo R hip ROM to be pain free in all directions.   Goal status: in progress    LONG TERM GOALS: Target date: 02/12/2024   Pt to be independent with final HEP  Goal status: In progress  2.  Pt to report decreased pain in R hip to 0-3/10 with activity and walking   Goal status: In progress  3.  Pt to demo  improved strength of R hip to be at least 4/5 , to improve stability, gait and pain   Goal status: Met- for some directions      PLAN:  PT FREQUENCY: 1-2x/week  PT DURATION: 8 weeks  PLANNED INTERVENTIONS: Therapeutic exercises, Therapeutic activity, Neuromuscular re-education, Patient/Family education, Self Care, Joint mobilization, Joint manipulation, Stair training, Orthotic/Fit training, DME instructions, Aquatic Therapy, Dry Needling, Electrical stimulation, Cryotherapy, Moist heat, Taping, Ultrasound, Ionotophoresis 4mg /ml Dexamethasone , Manual therapy,  Vasopneumatic device, Traction, Spinal manipulation, Spinal mobilization,Balance training, Gait training,   PLAN FOR NEXT SESSION:    Tinnie Don, PT, DPT 2:21 PM  01/01/24

## 2024-01-01 NOTE — Progress Notes (Signed)
 Physical therapy reached out to me today to ask if I would order behavioral health referral for [facility]. Ordered today.

## 2024-01-02 DIAGNOSIS — N390 Urinary tract infection, site not specified: Secondary | ICD-10-CM | POA: Diagnosis not present

## 2024-01-02 DIAGNOSIS — F329 Major depressive disorder, single episode, unspecified: Secondary | ICD-10-CM | POA: Diagnosis not present

## 2024-01-02 DIAGNOSIS — R7303 Prediabetes: Secondary | ICD-10-CM | POA: Diagnosis not present

## 2024-01-02 DIAGNOSIS — A419 Sepsis, unspecified organism: Secondary | ICD-10-CM | POA: Diagnosis not present

## 2024-01-02 DIAGNOSIS — I10 Essential (primary) hypertension: Secondary | ICD-10-CM | POA: Diagnosis not present

## 2024-01-02 DIAGNOSIS — H259 Unspecified age-related cataract: Secondary | ICD-10-CM | POA: Diagnosis not present

## 2024-01-02 DIAGNOSIS — F419 Anxiety disorder, unspecified: Secondary | ICD-10-CM | POA: Diagnosis not present

## 2024-01-02 DIAGNOSIS — E785 Hyperlipidemia, unspecified: Secondary | ICD-10-CM | POA: Diagnosis not present

## 2024-01-02 DIAGNOSIS — M542 Cervicalgia: Secondary | ICD-10-CM | POA: Diagnosis not present

## 2024-01-02 DIAGNOSIS — M25561 Pain in right knee: Secondary | ICD-10-CM | POA: Diagnosis not present

## 2024-01-02 DIAGNOSIS — J449 Chronic obstructive pulmonary disease, unspecified: Secondary | ICD-10-CM | POA: Diagnosis not present

## 2024-01-02 DIAGNOSIS — F319 Bipolar disorder, unspecified: Secondary | ICD-10-CM | POA: Diagnosis not present

## 2024-01-05 ENCOUNTER — Encounter: Payer: Self-pay | Admitting: Family Medicine

## 2024-01-05 ENCOUNTER — Ambulatory Visit (INDEPENDENT_AMBULATORY_CARE_PROVIDER_SITE_OTHER): Admitting: Family Medicine

## 2024-01-05 VITALS — BP 132/80 | HR 81 | Temp 98.1°F | Ht 68.0 in | Wt 183.4 lb

## 2024-01-05 DIAGNOSIS — L03114 Cellulitis of left upper limb: Secondary | ICD-10-CM

## 2024-01-05 DIAGNOSIS — R0989 Other specified symptoms and signs involving the circulatory and respiratory systems: Secondary | ICD-10-CM | POA: Diagnosis not present

## 2024-01-05 DIAGNOSIS — F411 Generalized anxiety disorder: Secondary | ICD-10-CM

## 2024-01-05 DIAGNOSIS — F319 Bipolar disorder, unspecified: Secondary | ICD-10-CM

## 2024-01-05 DIAGNOSIS — G3184 Mild cognitive impairment, so stated: Secondary | ICD-10-CM

## 2024-01-05 MED ORDER — BUPROPION HCL ER (XL) 150 MG PO TB24: 150.0000 mg | ORAL_TABLET | Freq: Every day | ORAL | 1 refills | Status: DC

## 2024-01-05 NOTE — Progress Notes (Signed)
 OFFICE VISIT  01/05/2024  CC:  Chief Complaint  Patient presents with   Medical Management of Chronic Issues    Patient is a 73 y.o. female who presents for follow-up labile hypertension, anxiety and depression, and recent cat bite.  HPI: More anxiety lately, decreased motivation.  She also had 1 recent crying spell. Thoughts are not exactly racing but she does have some cognitive clouding at times, forgets things. Feels like alprazolam  in the morning helps.  She does not miss any doses of her regular medications.  She went to the emergency department on 12/22/2023, fearful that her left wrist cat bite infection was going to cause sepsis.  They added doxycycline  to the Augmentin .  She chose to not go to the orthopedist as we had arranged at last visit.  Her blood pressure was up in the emergency department, no BP medication changes were made.  She has not been checking it at home.  She says she has a little bit of tremor in the mornings. No fever.  Headaches have not been regularly occurring. Occasionally feels a sense of imbalance of mild intensity, no known trigger. No recent falls.  No vertigo. No n/v/d or abd pain.  No LE swelling or pain.  No SOB or CP.  Past Medical History:  Diagnosis Date   Acute encephalopathy 11/19/2017   Alcoholism in remission [facility])    states last alcohol 11 yrs ago   Anemia    Anxiety    Bipolar 1 disorder (HCC)    Admission for mania x 2 (most recent 11/2017)   Cervical spondylosis without myelopathy 12/19/2013   MRI 02/2014: multilevel DDD/spondylosis, not much change compared to prior MRI.  Pt not in favor of invasive therapy for her neck as of 04/2014.   Chronic constipation    Chronic renal insufficiency, stage 3 (moderate)    COPD (chronic obstructive pulmonary disease) (HCC)    Moderate: anoro started 04/2017 by pulm, pt symptomatically improved and PFTs stable at f/u 06/2017.   Coronary artery spasm 07/11/2018   nitrates=HA. Amlodipine =intol  swelling.  Changed to coreg  09/03/18 by cardiologist.   Depression    Endometritis 05/2017   possible; empiric tx with Flagyl  by GYN.   Femoral neck fracture (HCC)    right, 01/28/23-->ORIF   Fibromyalgia    GERD (gastroesophageal reflux disease)    Hepatic steatosis 03/2019   u/s abd   Herpes zoster 08/07/2017   L scapular region   Hiatal hernia    History of adenomatous polyp of colon 04/2008; 08/2014   No high grade dysplasia: recall 5 yrs (Dr. Dianna, [facility])   History of basal cell carcinoma excision    NOSE   History of Helicobacter pylori infection 04/2008   +gastric biopsy (gastritis but no metaplasia, dysplasia, or malignancy identified)   History of kidney stones    History of TIA (transient ischemic attack)    secondary to HELLP syndrome 1994 post c/s--  residual memory loss   Hyperlipidemia    statin started after her NSTEMI: goal  LDL <70.   Hypertension    Ischemic cardiomyopathy 05/2018   Echo EF 45-50% in context of NSTEMI from CA spasm. 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 Magdalen 04/2017.   Memory loss    Abnl MRI  brain and CT brain c/w chronic microvascular ischemia.  Repeat MRI brain 02/2014 stable (Dr. Smitty with no plan to f/u with neuro as of 04/2014)   NSTEMI (non-ST elevated myocardial infarction) (HCC) 05/2018   Echo EF 45-50% in context of NSTEMI from CA spasm.// Myoview  8/21: EF 66, no ischemia, low risk   Osteoarthritis of left wrist    STT and 1st [facility] jt   Osteoporosis 05/2010   2012 'penia; 06/2015 'porosis:  prolia .  DEXA 09/2017 T-score -2.2 femur neck. 11/2021 T score -2.2. Rpt 2 yrs   Pelvic floor dysfunction    [facility] (Dr. Cam)   Postmenopausal vaginal bleeding 05/2017   x 1 small  episode; GYN attempted endo bx but unable to penetrate cervix due to severe cerv stenosis (due to postmenopausal state + hx of LEEP).  Endo u/s showed thin endo lining.  Per GYN---no evidence of endomet pathology on w/u---obs as of 07/2017.   Prediabetes 06/2016   Fasting gluc 105; HbA1c at that time was 6.0%.  A1c 6.2% 12/2017.  A1c 5.7% Feb 2023.   Recurrent kidney stones 08/2013   Left ureteral calculus: Perc nephr--cystoscopy w/ureteroscopy + laser for stone removal.  Residual asymptomatic left renal nephrolithiasis <88mm present post-procedure.  Right sided hydronephrosis-persistent (on u/s)--urol ordered CT to further eval 08/2016: 1.6 cm nonobstructing stone lower pole left kidney, no hydro, plan for PCN extraction ([facility])   Sepsis (HCC)    UTI   Suprapubic discomfort 06/05/2015   TMJ (temporomandibular joint disorder)    USES  MOUTH GUARD   Toe fracture, left 12/2017   2nd toe prox phalanx (immobilization, Dr. Elner)   Vitamin D deficiency 05/2011    Past Surgical History:  Procedure Laterality Date   ABIs  11/29/2019   NORMAL   ANTERIOR CERVICAL DECOMP/DISCECTOMY FUSION  06/30/2009   C3 -- C5  AND EXPLORATION OF FUSION C5-7 W/  PLATE REMOVAL   ARTHRODESIS METATARSALPHALANGEAL JOINT (MTPJ) Left 07/08/2021   Procedure: Left hallux metatarsophalangeal joint arthrodesis;  Surgeon: Kit Rush, MD;  Location: [facility];  Service: Orthopedics;  Laterality: Left;   BACK SURGERY     BUNIONECTOMY Left    CARDIOVASCULAR STRESS TEST  12/03/2019   myoc perf im: NORMAL   CERVICAL FUSION  2002   anterior C5 -- C7   CERVICAL SPINE SURGERY  08/27/1999     C6 -- T1  LAMINECTOMY/  DISKECTOMY   CESAREAN SECTION  1994   CHOLECYSTECTOMY N/A 03/06/2020   Procedure: LAPAROSCOPIC CHOLECYSTECTOMY;  Surgeon: Lyndel Deward PARAS, MD;  Location: [facility];  Service: General;  Laterality: N/A;   COLONOSCOPY W/ POLYPECTOMY  04/2008;08/2014   2016 tubular adenoma x 1; +diverticulosis and int/ext  hemorrhoids.  06/2020 adenoma, recall 5 yrs.   CYSTOSCOPY WITH URETEROSCOPY AND STENT PLACEMENT Left 08/28/2013   Procedure: CYSTOSCOPY, RGP,  WITH URETEROSCOPY AND STENT REMOVAL;  Surgeon: Alm GORMAN Fragmin, MD;  Location: [facility];  Service: Urology;  Laterality: Left;   DEXA  09/2017   T-score -2.2 femur neck: improved compared to 06/2015.  2021 DEXA T score -2.3.  11/2021 DEXA T score -2.2.   ESOPHAGOGASTRODUODENOSCOPY  2010; 08/2014   2016 +Candidal esophagitis; Mild chronic gastritis w/intestinal metaplasia--NEG H pylori, neg for eosinophilic esoph. 06/2020 esoph candidiasis (diflucan  x 20d), o/w normal.   HAMMER TOE SURGERY Left 07/08/2021   Procedure: Second metatarsal head excision; revision Second hammertoe correction; 2-5 percutaneus flexor tenotomies;  Surgeon: Kit Rush, MD;  Location: [facility];  Service: Orthopedics;  Laterality: Left;   HARDWARE REMOVAL Left 07/08/2021   Procedure: Removal of deep implants from hallux proximal phalanx and First and Second metatarsals;  Surgeon: Kit Rush, MD;  Location: [facility];  Service: Orthopedics;  Laterality: Left;   HIP PINNING,CANNULATED Right 01/28/2023   Procedure: PERCUTANEOUS FIXATION OF RIGHT FEMORAL NECK;  Surgeon: Sharl Selinda Dover, MD;  Location: [facility];  Service: Orthopedics;  Laterality: Right;   HOLMIUM LASER APPLICATION Left 08/28/2013   Procedure: HOLMIUM LASER APPLICATION;  Surgeon: Alm GORMAN Fragmin, MD;  Location: [facility];  Service: Urology;  Laterality: Left;   LEFT HEART CATH AND CORONARY ANGIOGRAPHY N/A 05/15/2018   Evidence for spasm in distal LAD.  Mod RCA atherosclerosis, o/w no CAD.  Procedure: LEFT HEART CATH AND CORONARY ANGIOGRAPHY;  Surgeon: Anner Alm ORN, MD;  Location: [facility];  Service: Cardiovascular;  Laterality: N/A;   NEPHROLITHOTOMY Left 08/05/2013   Procedure: NEPHROLITHOTOMY PERCUTANEOUS;  Surgeon: Alm GORMAN Fragmin, MD;   Location: [facility];  Service: Urology;  Laterality: Left;   POLYSOMNOGRAM  01/2019   NORMAL 2008 and 2020   POLYSOMNOGRAPHY  10/25/2018   Dr. Wilbert Passey due to pt inadequat sleep time (83 min).   TONSILLECTOMY AND ADENOIDECTOMY  1975   TOTAL KNEE ARTHROPLASTY Right 2003   TOTAL KNEE REVISION Right 01/19/2022   Procedure: Right knee polyethylene revision;  Surgeon: Melodi Lerner, MD;  Location: [facility];  Service: Orthopedics;  Laterality: Right;   TRANSTHORACIC ECHOCARDIOGRAM  05/2018; 12/03/2019   (after MI secondary to arterial spasm): EF 45-50%; severe hypokinesis of apical anterolateral, inferolateral , anterior, and inferior LV myocardium. 11/2019 EF 55-60%, valves fine   ULTRASOUND EXAM,PELVIC COMPLETE ([facility] HX)  06/25/2015   NORMAL (done by GYN)    Outpatient Medications Prior to Visit  Medication Sig Dispense Refill   albuterol  (VENTOLIN  HFA) 108 (90 Base) MCG/ACT inhaler TAKE 2 PUFFS BY MOUTH EVERY 6 HOURS AS NEEDED FOR WHEEZE OR SHORTNESS OF BREATH 18 each 0   ALPRAZolam  (XANAX ) 0.5 MG tablet TAKE 1-2 TABLETS BY MOUTH THREE TIMES A DAY AS NEEDED FOR ANXIETY 180 tablet 5   amoxicillin -clavulanate (AUGMENTIN ) 875-125 MG tablet Take 1 tablet by mouth 2 (two) times daily. 20 tablet 0   atorvastatin  (LIPITOR ) 80 MG tablet TAKE 1 TABLET BY MOUTH EVERY DAY 90 tablet 1   buPROPion  (WELLBUTRIN  XL) 300 MG 24 hr tablet TAKE 1 TABLET BY MOUTH EVERY DAY 90 tablet 1   citalopram  (CELEXA ) 40 MG tablet TAKE 1 TABLET BY MOUTH EVERY DAY 30 tablet 2   doxycycline  (VIBRAMYCIN ) 100 MG capsule Take 1 capsule (100 mg total) by mouth 2 (two) times daily. 14 capsule 0   famotidine  (PEPCID ) 40 MG tablet TAKE 1 TABLET BY MOUTH EVERY DAY 90 tablet 1   fluticasone  (FLONASE ) 50 MCG/ACT nasal spray SPRAY 2 SPRAYS INTO EACH NOSTRIL EVERY DAY 48 mL 1   hydrochlorothiazide  (HYDRODIURIL ) 25 MG tablet Take 1 tablet (25 mg total) by mouth daily. 90 tablet 3   HYDROcodone -acetaminophen  (NORCO/VICODIN)  5-325 MG tablet 1-2 tab po bid as needed for pain 90 tablet 0   lamoTRIgine  (LAMICTAL ) 100 MG tablet TAKE 3 TABLETS (300 MG TOTAL) BY MOUTH AT BEDTIME 270 tablet 2   loratadine  (CLARITIN ) 10 MG tablet Take 10 mg by mouth daily.     losartan  (COZAAR ) 25 MG tablet 1 tab po bid 180 tablet 3   meloxicam  (MOBIC ) 15 MG tablet Take 15 mg  by mouth daily.     metoprolol  succinate (TOPROL -XL) 100 MG 24 hr tablet 1 and 1/2 tabs p.o. daily.  Take with or immediately following a meal. 135 tablet 1   montelukast  (SINGULAIR ) 10 MG tablet TAKE 1 TABLET BY MOUTH EVERY DAY IN THE EVENING 90 tablet 1   ondansetron  (ZOFRAN -ODT) 4 MG disintegrating tablet Take 1 tablet (4 mg total) by mouth every 8 (eight) hours as needed. 8 tablet 0   pantoprazole  (PROTONIX ) 40 MG tablet Take 1 tablet (40 mg total) by mouth daily. PLEASE SCHEDULE F/U VISIT FOR ANY FUTURE REFILLS 90 tablet 0   potassium chloride  SA (KLOR-CON  M) 20 MEQ tablet TAKE 1 TABLET BY MOUTH EVERY DAY 90 tablet 1   Tiotropium Bromide -Olodaterol (STIOLTO RESPIMAT ) 2.5-2.5 MCG/ACT AERS Inhale 2 each into the lungs daily. 4 g 5   nitroGLYCERIN  (NITROSTAT ) 0.4 MG SL tablet PLACE 1 TABLET (0.4 MG TOTAL) UNDER THE TONGUE EVERY 5 (FIVE) MINUTES X 3 DOSES AS NEEDED FOR CHEST PAIN. (Patient not taking: Reported on 01/05/2024) 25 tablet 1   No facility-administered medications prior to visit.    Allergies  Allergen Reactions   Amlodipine  Swelling   Ceftin [Cefuroxime Axetil] Anaphylaxis and Swelling    Swelling of eyes and tongue   Cipro  [Ciprofloxacin  Hcl] Anaphylaxis and Swelling   Risperdal  [Risperidone ] Palpitations    Generalized weakness Malaise  Hypertension   Fosamax  [Alendronate  Sodium] Diarrhea   Linzess [Linaclotide] Diarrhea   Morphine  And Codeine Other (See Comments)    Hallucinations Disorientation    Movantik  [Naloxegol ]     N/v   Prednisone  Other (See Comments)    Hyperactivity   Roxicodone  [Oxycodone ] Other (See Comments)    Previously  addicted to medication   Seroquel  [Quetiapine ] Other (See Comments)    Oversedation     Review of Systems  As per HPI  PE:    01/05/2024   11:08 AM 12/22/2023    8:45 PM 12/22/2023    8:30 PM  Vitals with BMI  Height 5' 8    Weight 183 lbs 6 oz    BMI 27.89    Systolic 132 165 826  Diastolic 80 81 79  Pulse 81      Physical Exam  Gen: Alert, well appearing.  Patient is oriented to person, place, time, and situation. AFFECT: Anxious, pleasant, lucid thought and speech but sometimes had some difficulty with recall.   LABS:  Last CBC Lab Results  Component Value Date   WBC 6.6 12/22/2023   HGB 10.6 (L) 12/22/2023   HCT 30.5 (L) 12/22/2023   MCV 87.9 12/22/2023   MCH 30.5 12/22/2023   RDW 12.3 12/22/2023   PLT 265 12/22/2023   Lab Results  Component Value Date   IRON 64 02/10/2023   TIBC 256 02/10/2023   FERRITIN 234 02/10/2023   Last metabolic panel Lab Results  Component Value Date   GLUCOSE 109 (H) 12/22/2023   NA 134 (L) 12/22/2023   K 4.5 12/22/2023   CL 98 12/22/2023   CO2 22 12/22/2023   BUN 15 12/22/2023   CREATININE 1.00 12/22/2023   GFRNONAA >60 12/22/2023   CALCIUM  9.7 12/22/2023   PROT 6.8 12/22/2023   ALBUMIN  4.6 12/22/2023   BILITOT 0.5 12/22/2023   ALKPHOS 70 12/22/2023   AST 20 12/22/2023   ALT 19 12/22/2023   ANIONGAP 14 12/22/2023   Last vitamin B12 and Folate Lab Results  Component Value Date   VITAMINB12 933 02/10/2023   IMPRESSION AND PLAN:  #  1 bipolar disorder, GAD. Cloudy thinking, some forgetfulness--> I want a make sure she is not overmedicated.  Will decrease her Wellbutrin  XL to 150 mg a day.  Continue citalopram  40 mg a day for now.  Consider discontinuation of Wellbutrin  completely or decrease citalopram  to 20 mg at follow-up in 2 weeks. Continue alprazolam  and Lamictal  (300 mg a day). Referral to behavioral health was made earlier this week. Gave her their contact information today.  2.  Labile  hypertension. Blood pressure normal here today. She will try to limit blood pressure checks to 2 times a week. Continue HCTZ 25 mg a day, losartan  25 mg twice daily, and Toprol -XL 150 mg a day.  #3 left wrist cat bite infection. Resolved status post Augmentin  and doxycycline .  An After Visit Summary was printed and given to the patient.  FOLLOW UP: No follow-ups on file.  Signed:  Gerlene Hockey, MD           01/05/2024

## 2024-01-05 NOTE — Patient Instructions (Addendum)
   It was very nice to see you today!   [facility] Address: [address] [place], [place] [address] Phone: [phone]  Stop your wellbutrin  xl 300mg  tab. I sent in a prescription today for the 150mg  strength to start taking once a day.  Continue all of your other medication as is.

## 2024-01-08 ENCOUNTER — Ambulatory Visit: Admitting: Physical Therapy

## 2024-01-08 ENCOUNTER — Encounter: Payer: Self-pay | Admitting: Physical Therapy

## 2024-01-08 DIAGNOSIS — R2689 Other abnormalities of gait and mobility: Secondary | ICD-10-CM | POA: Diagnosis not present

## 2024-01-08 DIAGNOSIS — M25551 Pain in right hip: Secondary | ICD-10-CM

## 2024-01-08 NOTE — Therapy (Signed)
 OUTPATIENT PHYSICAL THERAPY LOWER EXTREMITY TREATMENT   Patient Name: Erin Lopez MRN: [id number] DOB:06-Feb-1951, 73 y.o., female Today's Date: 01/08/2024  END OF SESSION:  PT End of Session - 01/08/24 1355     Visit Number 10    Number of Visits 16    Date for Recertification  02/12/24    Authorization Type [employer], rcert done at visit 8    PT Start Time 1302    PT Stop Time 1343    PT Time Calculation (min) 41 min    Activity Tolerance Patient tolerated treatment well    Behavior During Therapy [facility] for tasks assessed/performed               Past Medical History:  Diagnosis Date   Acute encephalopathy 11/19/2017   Alcoholism in remission (HCC)    states last alcohol 11 yrs ago   Anemia    Anxiety    Bipolar 1 disorder (HCC)    Admission for mania x 2 (most recent 11/2017)   Cervical spondylosis without myelopathy 12/19/2013   MRI 02/2014: multilevel DDD/spondylosis, not much change compared to prior MRI.  Pt not in favor of invasive therapy for her neck as of 04/2014.   Chronic constipation    Chronic renal insufficiency, stage 3 (moderate)    COPD (chronic obstructive pulmonary disease) (HCC)    Moderate: anoro started 04/2017 by pulm, pt symptomatically improved and PFTs stable at f/u 06/2017.   Coronary artery spasm 07/11/2018   nitrates=HA. Amlodipine =intol swelling.  Changed to coreg  09/03/18 by cardiologist.   Depression    Endometritis 05/2017   possible; empiric tx with Flagyl  by GYN.   Femoral neck fracture (HCC)    right, 01/28/23-->ORIF   Fibromyalgia    GERD (gastroesophageal reflux disease)    Hepatic steatosis 03/2019   u/s abd   Herpes zoster 08/07/2017   L scapular region   Hiatal hernia    History of adenomatous polyp of colon 04/2008; 08/2014   No high grade dysplasia: recall 5 yrs (Dr. Dianna, [facility])   History of basal cell carcinoma excision    NOSE   History of Helicobacter pylori infection 04/2008   +gastric biopsy  (gastritis but no metaplasia, dysplasia, or malignancy identified)   History of kidney stones    History of TIA (transient ischemic attack)    secondary to HELLP syndrome 1994 post c/s--  residual memory loss   Hyperlipidemia    statin started after her NSTEMI: goal  LDL <70.   Hypertension    Ischemic cardiomyopathy 05/2018   Echo EF 45-50% in context of NSTEMI from CA spasm. 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 Magdalen 04/2017.   MCI (mild cognitive impairment) with memory loss    Memory loss    Abnl MRI brain and CT brain c/w chronic microvascular ischemia.  Repeat MRI brain 02/2014 stable (Dr. Smitty with no plan to f/u with neuro as of 04/2014)   NSTEMI (non-ST elevated myocardial infarction) (HCC) 05/2018   Echo EF 45-50% in context of NSTEMI from CA spasm.// Myoview  8/21: EF 66, no ischemia, low risk   Osteoarthritis of left wrist    STT and 1st [facility] jt   Osteoporosis 05/2010   2012 'penia; 06/2015 'porosis:  prolia .  DEXA 09/2017  T-score -2.2 femur neck. 11/2021 T score -2.2. Rpt 2 yrs   Pelvic floor dysfunction    [facility] (Dr. Cam)   Postmenopausal vaginal bleeding 05/2017   x 1 small episode; GYN attempted endo bx but unable to penetrate cervix due to severe cerv stenosis (due to postmenopausal state + hx of LEEP).  Endo u/s showed thin endo lining.  Per GYN---no evidence of endomet pathology on w/u---obs as of 07/2017.   Prediabetes 06/2016   Fasting gluc 105; HbA1c at that time was 6.0%.  A1c 6.2% 12/2017.  A1c 5.7% Feb 2023.   Recurrent kidney stones 08/2013   Left ureteral calculus: Perc nephr--cystoscopy w/ureteroscopy + laser for stone removal.  Residual asymptomatic left renal nephrolithiasis <60mm present post-procedure.   Right sided hydronephrosis-persistent (on u/s)--urol ordered CT to further eval 08/2016: 1.6 cm nonobstructing stone lower pole left kidney, no hydro, plan for PCN extraction ([facility])   Sepsis (HCC)    UTI   Suprapubic discomfort 06/05/2015   TMJ (temporomandibular joint disorder)    USES  MOUTH GUARD   Toe fracture, left 12/2017   2nd toe prox phalanx (immobilization, Dr. Elner)   Vitamin D deficiency 05/2011   Past Surgical History:  Procedure Laterality Date   ABIs  11/29/2019   NORMAL   ANTERIOR CERVICAL DECOMP/DISCECTOMY FUSION  06/30/2009   C3 -- C5  AND EXPLORATION OF FUSION C5-7 W/  PLATE REMOVAL   ARTHRODESIS METATARSALPHALANGEAL JOINT (MTPJ) Left 07/08/2021   Procedure: Left hallux metatarsophalangeal joint arthrodesis;  Surgeon: Kit Rush, MD;  Location: [facility];  Service: Orthopedics;  Laterality: Left;   BACK SURGERY     BUNIONECTOMY Left    CARDIOVASCULAR STRESS TEST  12/03/2019   myoc perf im: NORMAL   CERVICAL FUSION  2002   anterior C5 -- C7   CERVICAL SPINE SURGERY  08/27/1999     C6 -- T1  LAMINECTOMY/  DISKECTOMY   CESAREAN SECTION  1994   CHOLECYSTECTOMY N/A 03/06/2020   Procedure: LAPAROSCOPIC CHOLECYSTECTOMY;  Surgeon: Lyndel Deward PARAS, MD;  Location: [facility];  Service: General;  Laterality: N/A;   COLONOSCOPY W/ POLYPECTOMY  04/2008;08/2014   2016 tubular adenoma x 1; +diverticulosis and int/ext hemorrhoids.  06/2020 adenoma, recall 5 yrs.   CYSTOSCOPY WITH URETEROSCOPY AND STENT PLACEMENT Left 08/28/2013   Procedure: CYSTOSCOPY, RGP,  WITH URETEROSCOPY AND STENT REMOVAL;  Surgeon: Alm GORMAN Fragmin, MD;  Location: [facility];  Service: Urology;  Laterality: Left;   DEXA  09/2017   T-score -2.2 femur neck: improved compared to 06/2015.  2021 DEXA T score -2.3.  11/2021 DEXA T score -2.2.   ESOPHAGOGASTRODUODENOSCOPY  2010; 08/2014   2016 +Candidal esophagitis; Mild chronic gastritis w/intestinal metaplasia--NEG H pylori, neg for  eosinophilic esoph. 06/2020 esoph candidiasis (diflucan  x 20d), o/w normal.   HAMMER TOE SURGERY Left 07/08/2021   Procedure: Second metatarsal head excision; revision Second hammertoe correction; 2-5 percutaneus flexor tenotomies;  Surgeon: Kit Rush, MD;  Location: [facility];  Service: Orthopedics;  Laterality: Left;   HARDWARE REMOVAL Left 07/08/2021   Procedure: Removal of deep implants from hallux proximal phalanx and First and Second metatarsals;  Surgeon: Kit Rush, MD;  Location: [facility];  Service: Orthopedics;  Laterality: Left;   HIP PINNING,CANNULATED Right 01/28/2023   Procedure: PERCUTANEOUS FIXATION OF RIGHT FEMORAL NECK;  Surgeon: Sharl Selinda Dover, MD;  Location: [facility];  Service: Orthopedics;  Laterality: Right;   HOLMIUM LASER APPLICATION Left 08/28/2013  Procedure: HOLMIUM LASER APPLICATION;  Surgeon: Alm GORMAN Fragmin, MD;  Location: [facility];  Service: Urology;  Laterality: Left;   LEFT HEART CATH AND CORONARY ANGIOGRAPHY N/A 05/15/2018   Evidence for spasm in distal LAD.  Mod RCA atherosclerosis, o/w no CAD.  Procedure: LEFT HEART CATH AND CORONARY ANGIOGRAPHY;  Surgeon: Anner Alm ORN, MD;  Location: [facility];  Service: Cardiovascular;  Laterality: N/A;   NEPHROLITHOTOMY Left 08/05/2013   Procedure: NEPHROLITHOTOMY PERCUTANEOUS;  Surgeon: Alm GORMAN Fragmin, MD;  Location: [facility];  Service: Urology;  Laterality: Left;   POLYSOMNOGRAM  01/2019   NORMAL 2008 and 2020   POLYSOMNOGRAPHY  10/25/2018   Dr. Wilbert Passey due to pt inadequat sleep time (83 min).   TONSILLECTOMY AND ADENOIDECTOMY  1975   TOTAL KNEE ARTHROPLASTY Right 2003   TOTAL KNEE REVISION Right 01/19/2022   Procedure: Right knee polyethylene revision;  Surgeon: Melodi Lerner, MD;  Location: [facility];  Service: Orthopedics;  Laterality: Right;   TRANSTHORACIC ECHOCARDIOGRAM  05/2018; 12/03/2019   (after MI secondary to arterial  spasm): EF 45-50%; severe hypokinesis of apical anterolateral, inferolateral , anterior, and inferior LV myocardium. 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   Healthcare maintenance 04/19/2019   Overweight (BMI 25.0-29.9) 02/19/2019   Ischemic cardiomyopathy 09/03/2018   Coronary vasospasm 07/11/2018   NSTEMI (non-ST elevated myocardial infarction) (HCC) 05/14/2018   Hyponatremia 11/16/2017   Bipolar 1 disorder (HCC) 12/29/2016   Hypertension 12/29/2016   History of TIA (transient ischemic attack) 12/29/2016   Chronic pelvic pain in female 08/22/2016   Stage 2 chronic kidney disease 08/22/2016   Osteoporosis 09/30/2015   Health maintenance examination 06/11/2015   Urethral caruncle 06/05/2015   UTI (urinary tract infection) 06/05/2015   TMJ (temporomandibular joint disorder) 02/25/2015   Right ankle injury 11/05/2014   Cervical spondylosis without myelopathy 12/19/2013   Sprain of neck 12/19/2013   Memory difficulties 12/19/2013   Postnasal drip 10/28/2013   Constipation, chronic 07/29/2013   COPD (chronic obstructive pulmonary disease) (HCC) 06/30/2013   Kidney stone on left side 05/29/2013   Hyperlipidemia 05/29/2013     PCP: Phiip McGowen  REFERRING PROVIDER: Phiip McGowen  REFERRING DIAG: R hip pain   THERAPY DIAG:  Pain in right hip  Other abnormalities of gait and mobility  Rationale for Evaluation and Treatment: Rehabilitation  ONSET DATE:  01/28/23   SUBJECTIVE:   SUBJECTIVE STATEMENT: Pt  still with discomfort in hip with increased activity.   Eval: Pt had fall, suffering R femur  fracture on 01/28/23 last year.  She had fixation of femoral neck by Dr. Sharl. States difficulty in hospital and recovering from this, had sepsis, etc. She had difficulty with mobility after this, did not have much PT, had some home heath. States R hip still sore, notes pain in lateral hip with activity and walking.  She also had another fall in April at home, fell hard, broke nose, hurt face, concussion and broke R wrist. Still wearing brace on R hand, but reports wrist doing well at this time.  She notes difficulty with BP lately, being  high and low. States difficulty with motivation lately, and feels like she has no energy- MD aware.  Reports sleeping well at night.  Lives alone, does have life alert and does drive.  States she feels something moving in hip when she turns her leg a certain direction ( S/L Hip er/ir)    PERTINENT HISTORY: CKD, HTN,  TIA,  COPD, Osteoporosis, Bipolar,  R TKA and revision  PAIN:  Are you having pain? Yes: NPRS scale: up to 5/10 Pain location: R lateral hip Pain description: sore Aggravating factors: stairs, moving sideways,  Relieving factors: rest/sitting   PRECAUTIONS: Fall  WEIGHT BEARING RESTRICTIONS: No  FALLS:  Has patient fallen in last 6 months? Yes. Number of falls 2 - broke R wrist/outside got out of car,     PLOF: Independent   PATIENT GOALS:  Decreased pain in hip, increased activity level.   NEXT MD VISIT:   OBJECTIVE:   DIAGNOSTIC FINDINGS:   PATIENT SURVEYS:   COGNITION: Overall cognitive status: Within functional limits for tasks assessed     SENSATION: WFL  EDEMA: n/a   POSTURE:    No Significant postural limitations  PALPATION: Tenderness at lateral R hip, and gr trochanter   LOWER EXTREMITY ROM: Hip flex, IR, ER- mild limitations Knees: WFL   LOWER EXTREMITY MMT:  MMT Left eval Right  eval 11/27/2023 Right 12/18/23 Right  Hip flexion 4+ 4- 4  4  Hip extension      Hip abduction  3/pain 4- 4-  (crepitus)   Hip adduction      Hip internal rotation      Hip external rotation 4+ 4 4 4+  Knee flexion 5 4+  5  Knee extension 5 4  4+  Ankle dorsiflexion      Ankle plantarflexion      Ankle inversion      Ankle eversion       (Blank rows = not tested)   LOWER EXTREMITY SPECIAL TESTS:    FUNCTIONAL TESTS:     GAIT: Distance walked: 150 Assistive device utilized: None Level of assistance: Complete Independence Comments: R valgus knee, decreased knee extension and heel strike on R.    TODAY'S TREATMENT:                                                                                                                              DATE:   01/08/2024 Therapeutic Exercise: Aerobic:  Bike L1, slow x 8 min Supine:    Bridging 2 x 10 ;  Clams GTB x 25;   Seated:     S/L:   Standing:   hip abd 2 x 10 bil   Stretches:  Neuromuscular Re-education:  Manual Therapy: Therapeutic Activity:   mini squats at counter 2 x 10 with cueing for mechanics.  Sit to stand x 10;  Side stepping  at counter 3 x 10;   Step ups 6 in x 10 on L,  x 5 on R- ( audible crepitus in hip)   Self Care:     Therapeutic Exercise: Aerobic: Supine:   SLR 2 x 10 bil( tried slight Er on R- harder/slight pain)     hip ER GTB x 20;  Seated:  LAQ 3 lb x 20 on R;  S/L: trial hip abd- grinding in hip  Standing:  squats at counter x 12;  at table x 10 ( cueing for hip and knee placement )   Hip abd 3 x 5 on R;   side stepping at counter x 10 Stretches:  Neuromuscular Re-education: Manual Therapy: Therapeutic Activity:    Self Care:   Ther ex:  Supine: SLR 2 x 10 on R;  Hip clams GTB x 15;(sore by end) ;  LAQ x 15 on L;    PATIENT EDUCATION:  Education details: updated and reviewed HEP Person educated: Patient Education method: Explanation, Demonstration, Tactile cues, Verbal cues, and Handouts Education comprehension: verbalized understanding, returned demonstration, verbal cues required, tactile  cues required, and needs further education   HOME EXERCISE PROGRAM: Access Code: [email or web address] URL: [email or web address] Date: 10/13/2023 Prepared by: Tinnie Don Exercises - Straight Leg Raise  - 1 x daily - 2 sets - 5- 10 reps - Seated Knee Extension AROM  - 2 x daily - 2 sets - 10 reps  ASSESSMENT:  CLINICAL IMPRESSION: Pt challenged with strengthening. Encouraged more frequent HEP for improving strength. She is unable to do step up comfortably, due to crepitus in R hip with step up motion. Will benefit from continued care.   Eval: Patient presents with primary complaint of  pain in R hip. She had previous fracture with surgery last year, and continues to have pain and deficit since. She has weakness in R hip and increased pain with movement. She is overall deconditioned and would benefit from increasing mobility. She also has difficulty with balance and has had a few falls with injury. She will benefit from further evaluation of balance at future visits, and treatment of this as needed, to prevent further falls. She has decreased gait mechanics, with valgus R knee and decreased heel strike/short stride on R.  Pt with decreased ability for full functional activities. Pt will  benefit from skilled PT to improve deficits and pain and to return to PLOF.   OBJECTIVE IMPAIRMENTS: Abnormal gait, decreased activity tolerance, decreased coordination, decreased mobility, difficulty walking, decreased ROM, decreased strength, impaired flexibility, impaired vision/preception, improper body mechanics, and pain.   ACTIVITY LIMITATIONS: standing, squatting, stairs, transfers, and locomotion level  PARTICIPATION LIMITATIONS: meal prep, cleaning, laundry, driving, shopping, and community activity  PERSONAL FACTORS: Past/current experiences, Time since onset of injury/illness/exacerbation, and 1 comorbidity: surgery are also affecting patient's functional outcome.   REHAB POTENTIAL:  Good  CLINICAL DECISION MAKING: Evolving/moderate complexity  EVALUATION COMPLEXITY: Moderate   GOALS: Goals reviewed with patient? Yes  SHORT TERM GOALS: Target date: 11/03/2023   Pt to be independent with initial HEP  Goal status: MET  2.  Pt to demo R hip ROM to be pain free in all directions.   Goal status: in progress    LONG TERM GOALS: Target date: 02/12/2024   Pt to be independent with final HEP  Goal status: In progress  2.  Pt to report decreased pain in R hip to 0-3/10 with activity and walking   Goal status: In progress  3.  Pt to demo improved strength of R hip to be at least 4/5 , to improve  stability, gait and pain   Goal status: Met- for some directions      PLAN:  PT FREQUENCY: 1-2x/week  PT DURATION: 8 weeks  PLANNED INTERVENTIONS: Therapeutic exercises, Therapeutic activity, Neuromuscular re-education, Patient/Family education, Self Care, Joint mobilization, Joint manipulation, Stair training, Orthotic/Fit training, DME instructions, Aquatic Therapy, Dry Needling, Electrical stimulation, Cryotherapy, Moist heat, Taping, Ultrasound, Ionotophoresis 4mg /ml Dexamethasone , Manual therapy,  Vasopneumatic device, Traction, Spinal manipulation, Spinal mobilization,Balance training, Gait training,   PLAN FOR NEXT SESSION:   hip ext- in standing , bwd walking    Tinnie Don, PT, DPT 1:56 PM  01/08/24

## 2024-01-10 ENCOUNTER — Telehealth: Payer: Self-pay

## 2024-01-10 NOTE — Telephone Encounter (Addendum)
 Copied from CRM #[id number]. Topic: General - Other >> Jan 10, 2024  9:25 AM Carlyon D wrote: Reason for CRM: Alan RN With [facility] organization is calling in regards to pt reaching out to them to assist with getting pt on medicaid and pt has several other requests. If anything that sticks out in pcp mind that he believes pt needs Please reach out to [facility] at [phone].  LVM for pt to return call to confirm if she actually requested these services.  01/10/24 11:55am Spoke with pt and she confirmed she actually requested these services. No further action needed

## 2024-01-15 ENCOUNTER — Encounter: Payer: Self-pay | Admitting: Physical Therapy

## 2024-01-15 ENCOUNTER — Other Ambulatory Visit: Payer: Self-pay | Admitting: Family Medicine

## 2024-01-15 ENCOUNTER — Ambulatory Visit: Admitting: Physical Therapy

## 2024-01-15 DIAGNOSIS — R2689 Other abnormalities of gait and mobility: Secondary | ICD-10-CM | POA: Diagnosis not present

## 2024-01-15 DIAGNOSIS — M25551 Pain in right hip: Secondary | ICD-10-CM

## 2024-01-15 NOTE — Therapy (Signed)
 OUTPATIENT PHYSICAL THERAPY LOWER EXTREMITY TREATMENT   Patient Name: Erin Lopez MRN: [id number] DOB:14-May-1950, 72 y.o., female Today's Date: 01/15/2024  END OF SESSION:  PT End of Session - 01/15/24 1351     Visit Number 11    Number of Visits 16    Date for Recertification  02/12/24    Authorization Type [employer], rcert done at visit 8    PT Start Time 1352    PT Stop Time 1430    PT Time Calculation (min) 38 min    Activity Tolerance Patient tolerated treatment well    Behavior During Therapy [facility] for tasks assessed/performed               Past Medical History:  Diagnosis Date   Acute encephalopathy 11/19/2017   Alcoholism in remission (HCC)    states last alcohol 11 yrs ago   Anemia    Anxiety    Bipolar 1 disorder (HCC)    Admission for mania x 2 (most recent 11/2017)   Cervical spondylosis without myelopathy 12/19/2013   MRI 02/2014: multilevel DDD/spondylosis, not much change compared to prior MRI.  Pt not in favor of invasive therapy for her neck as of 04/2014.   Chronic constipation    Chronic renal insufficiency, stage 3 (moderate)    COPD (chronic obstructive pulmonary disease) (HCC)    Moderate: anoro started 04/2017 by pulm, pt symptomatically improved and PFTs stable at f/u 06/2017.   Coronary artery spasm 07/11/2018   nitrates=HA. Amlodipine =intol swelling.  Changed to coreg  09/03/18 by cardiologist.   Depression    Endometritis 05/2017   possible; empiric tx with Flagyl  by GYN.   Femoral neck fracture (HCC)    right, 01/28/23-->ORIF   Fibromyalgia    GERD (gastroesophageal reflux disease)    Hepatic steatosis 03/2019   u/s abd   Herpes zoster 08/07/2017   L scapular region   Hiatal hernia    History of adenomatous polyp of colon 04/2008; 08/2014   No high grade dysplasia: recall 5 yrs (Dr. Dianna, [facility])   History of basal cell carcinoma excision    NOSE   History of Helicobacter pylori infection 04/2008   +gastric biopsy  (gastritis but no metaplasia, dysplasia, or malignancy identified)   History of kidney stones    History of TIA (transient ischemic attack)    secondary to HELLP syndrome 1994 post c/s--  residual memory loss   Hyperlipidemia    statin started after her NSTEMI: goal  LDL <70.   Hypertension    Ischemic cardiomyopathy 05/2018   Echo EF 45-50% in context of NSTEMI from CA spasm. 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 Magdalen 04/2017.   MCI (mild cognitive impairment) with memory loss    Memory loss    Abnl MRI brain and CT brain c/w chronic microvascular ischemia.  Repeat MRI brain 02/2014 stable (Dr. Smitty with no plan to f/u with neuro as of 04/2014)   NSTEMI (non-ST elevated myocardial infarction) (HCC) 05/2018   Echo EF 45-50% in context of NSTEMI from CA spasm.// Myoview  8/21: EF 66, no ischemia, low risk   Osteoarthritis of left wrist    STT and 1st [facility] jt   Osteoporosis 05/2010   2012 'penia; 06/2015 'porosis:  prolia .  DEXA 09/2017  T-score -2.2 femur neck. 11/2021 T score -2.2. Rpt 2 yrs   Pelvic floor dysfunction    [facility] (Dr. Cam)   Postmenopausal vaginal bleeding 05/2017   x 1 small episode; GYN attempted endo bx but unable to penetrate cervix due to severe cerv stenosis (due to postmenopausal state + hx of LEEP).  Endo u/s showed thin endo lining.  Per GYN---no evidence of endomet pathology on w/u---obs as of 07/2017.   Prediabetes 06/2016   Fasting gluc 105; HbA1c at that time was 6.0%.  A1c 6.2% 12/2017.  A1c 5.7% Feb 2023.   Recurrent kidney stones 08/2013   Left ureteral calculus: Perc nephr--cystoscopy w/ureteroscopy + laser for stone removal.  Residual asymptomatic left renal nephrolithiasis <41mm present post-procedure.   Right sided hydronephrosis-persistent (on u/s)--urol ordered CT to further eval 08/2016: 1.6 cm nonobstructing stone lower pole left kidney, no hydro, plan for PCN extraction ([facility])   Sepsis (HCC)    UTI   Suprapubic discomfort 06/05/2015   TMJ (temporomandibular joint disorder)    USES  MOUTH GUARD   Toe fracture, left 12/2017   2nd toe prox phalanx (immobilization, Dr. Elner)   Vitamin D deficiency 05/2011   Past Surgical History:  Procedure Laterality Date   ABIs  11/29/2019   NORMAL   ANTERIOR CERVICAL DECOMP/DISCECTOMY FUSION  06/30/2009   C3 -- C5  AND EXPLORATION OF FUSION C5-7 W/  PLATE REMOVAL   ARTHRODESIS METATARSALPHALANGEAL JOINT (MTPJ) Left 07/08/2021   Procedure: Left hallux metatarsophalangeal joint arthrodesis;  Surgeon: Kit Rush, MD;  Location: [facility];  Service: Orthopedics;  Laterality: Left;   BACK SURGERY     BUNIONECTOMY Left    CARDIOVASCULAR STRESS TEST  12/03/2019   myoc perf im: NORMAL   CERVICAL FUSION  2002   anterior C5 -- C7   CERVICAL SPINE SURGERY  08/27/1999     C6 -- T1  LAMINECTOMY/  DISKECTOMY   CESAREAN SECTION  1994   CHOLECYSTECTOMY N/A 03/06/2020   Procedure: LAPAROSCOPIC CHOLECYSTECTOMY;  Surgeon: Lyndel Deward PARAS, MD;  Location: [facility];  Service: General;  Laterality: N/A;   COLONOSCOPY W/ POLYPECTOMY  04/2008;08/2014   2016 tubular adenoma x 1; +diverticulosis and int/ext hemorrhoids.  06/2020 adenoma, recall 5 yrs.   CYSTOSCOPY WITH URETEROSCOPY AND STENT PLACEMENT Left 08/28/2013   Procedure: CYSTOSCOPY, RGP,  WITH URETEROSCOPY AND STENT REMOVAL;  Surgeon: Alm GORMAN Fragmin, MD;  Location: [facility];  Service: Urology;  Laterality: Left;   DEXA  09/2017   T-score -2.2 femur neck: improved compared to 06/2015.  2021 DEXA T score -2.3.  11/2021 DEXA T score -2.2.   ESOPHAGOGASTRODUODENOSCOPY  2010; 08/2014   2016 +Candidal esophagitis; Mild chronic gastritis w/intestinal metaplasia--NEG H pylori, neg for  eosinophilic esoph. 06/2020 esoph candidiasis (diflucan  x 20d), o/w normal.   HAMMER TOE SURGERY Left 07/08/2021   Procedure: Second metatarsal head excision; revision Second hammertoe correction; 2-5 percutaneus flexor tenotomies;  Surgeon: Kit Rush, MD;  Location: [facility];  Service: Orthopedics;  Laterality: Left;   HARDWARE REMOVAL Left 07/08/2021   Procedure: Removal of deep implants from hallux proximal phalanx and First and Second metatarsals;  Surgeon: Kit Rush, MD;  Location: [facility];  Service: Orthopedics;  Laterality: Left;   HIP PINNING,CANNULATED Right 01/28/2023   Procedure: PERCUTANEOUS FIXATION OF RIGHT FEMORAL NECK;  Surgeon: Sharl Selinda Dover, MD;  Location: [facility];  Service: Orthopedics;  Laterality: Right;   HOLMIUM LASER APPLICATION Left 08/28/2013  Procedure: HOLMIUM LASER APPLICATION;  Surgeon: Alm GORMAN Fragmin, MD;  Location: [facility];  Service: Urology;  Laterality: Left;   LEFT HEART CATH AND CORONARY ANGIOGRAPHY N/A 05/15/2018   Evidence for spasm in distal LAD.  Mod RCA atherosclerosis, o/w no CAD.  Procedure: LEFT HEART CATH AND CORONARY ANGIOGRAPHY;  Surgeon: Anner Alm ORN, MD;  Location: [facility];  Service: Cardiovascular;  Laterality: N/A;   NEPHROLITHOTOMY Left 08/05/2013   Procedure: NEPHROLITHOTOMY PERCUTANEOUS;  Surgeon: Alm GORMAN Fragmin, MD;  Location: [facility];  Service: Urology;  Laterality: Left;   POLYSOMNOGRAM  01/2019   NORMAL 2008 and 2020   POLYSOMNOGRAPHY  10/25/2018   Dr. Wilbert Passey due to pt inadequat sleep time (83 min).   TONSILLECTOMY AND ADENOIDECTOMY  1975   TOTAL KNEE ARTHROPLASTY Right 2003   TOTAL KNEE REVISION Right 01/19/2022   Procedure: Right knee polyethylene revision;  Surgeon: Melodi Lerner, MD;  Location: [facility];  Service: Orthopedics;  Laterality: Right;   TRANSTHORACIC ECHOCARDIOGRAM  05/2018; 12/03/2019   (after MI secondary to arterial  spasm): EF 45-50%; severe hypokinesis of apical anterolateral, inferolateral , anterior, and inferior LV myocardium. 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   Healthcare maintenance 04/19/2019   Overweight (BMI 25.0-29.9) 02/19/2019   Ischemic cardiomyopathy 09/03/2018   Coronary vasospasm 07/11/2018   NSTEMI (non-ST elevated myocardial infarction) (HCC) 05/14/2018   Hyponatremia 11/16/2017   Bipolar 1 disorder (HCC) 12/29/2016   Hypertension 12/29/2016   History of TIA (transient ischemic attack) 12/29/2016   Chronic pelvic pain in female 08/22/2016   Stage 2 chronic kidney disease 08/22/2016   Osteoporosis 09/30/2015   Health maintenance examination 06/11/2015   Urethral caruncle 06/05/2015   UTI (urinary tract infection) 06/05/2015   TMJ (temporomandibular joint disorder) 02/25/2015   Right ankle injury 11/05/2014   Cervical spondylosis without myelopathy 12/19/2013   Sprain of neck 12/19/2013   Memory difficulties 12/19/2013   Postnasal drip 10/28/2013   Constipation, chronic 07/29/2013   COPD (chronic obstructive pulmonary disease) (HCC) 06/30/2013   Kidney stone on left side 05/29/2013   Hyperlipidemia 05/29/2013     PCP: Phiip McGowen  REFERRING PROVIDER: Phiip McGowen  REFERRING DIAG: R hip pain   THERAPY DIAG:  Pain in right hip  Other abnormalities of gait and mobility  Rationale for Evaluation and Treatment: Rehabilitation  ONSET DATE:  01/28/23   SUBJECTIVE:   SUBJECTIVE STATEMENT: Pt with no new complaints.   Eval: Pt had fall, suffering R femur fracture on 01/28/23 last year.  She  had fixation of femoral neck by Dr. Sharl. States difficulty in hospital and recovering from this, had sepsis, etc. She had difficulty with mobility after this, did not have much PT, had some home heath. States R hip still sore, notes pain in lateral hip with activity and walking.  She also had another fall in April at home, fell hard, broke nose, hurt face, concussion and broke R wrist. Still wearing brace on R hand, but reports wrist doing well at this time.  She notes difficulty with BP lately, being high and low. States difficulty  with motivation lately, and feels like she has no energy- MD aware.  Reports sleeping well at night.  Lives alone, does have life alert and does drive.  States she feels something moving in hip when she turns her leg a certain direction ( S/L Hip er/ir)    PERTINENT HISTORY: CKD, HTN,  TIA,  COPD, Osteoporosis, Bipolar,  R TKA and revision  PAIN:  Are you having pain? Yes: NPRS scale: up to 5/10 Pain location: R lateral hip Pain description: sore Aggravating factors: stairs, moving sideways,  Relieving factors: rest/sitting   PRECAUTIONS: Fall  WEIGHT BEARING RESTRICTIONS: No  FALLS:  Has patient fallen in last 6 months? Yes. Number of falls 2 - broke R wrist/outside got out of car,     PLOF: Independent   PATIENT GOALS:  Decreased pain in hip, increased activity level.   NEXT MD VISIT:   OBJECTIVE:   DIAGNOSTIC FINDINGS:   PATIENT SURVEYS:   COGNITION: Overall cognitive status: Within functional limits for tasks assessed     SENSATION: WFL  EDEMA: n/a   POSTURE:    No Significant postural limitations  PALPATION: Tenderness at lateral R hip, and gr trochanter   LOWER EXTREMITY ROM: Hip flex, IR, ER- mild limitations Knees: WFL   LOWER EXTREMITY MMT:  MMT Left eval Right  eval 11/27/2023 Right 12/18/23 Right  Hip flexion 4+ 4- 4  4  Hip extension      Hip abduction  3/pain 4- 4- (crepitus)   Hip adduction      Hip  internal rotation      Hip external rotation 4+ 4 4 4+  Knee flexion 5 4+  5  Knee extension 5 4  4+  Ankle dorsiflexion      Ankle plantarflexion      Ankle inversion      Ankle eversion       (Blank rows = not tested)   LOWER EXTREMITY SPECIAL TESTS:    FUNCTIONAL TESTS:     GAIT: Distance walked: 150 Assistive device utilized: None Level of assistance: Complete Independence Comments: R valgus knee, decreased knee extension and heel strike on R.    TODAY'S TREATMENT:                                                                                                                              DATE:   01/15/2024 Therapeutic Exercise: Aerobic:  Bike L1, slow x 8 min Supine:     Bridging 2 x 10 ;  Clams Blue TB x 20  Seated:     S/L:   Standing:   hip abd 2 x 10 bil ,  heel raises x 15  Stretches:  Neuromuscular Re-education:  Manual Therapy: Therapeutic Activity:  mini squats at counter x 10 with cueing for mechanics.  Sit to stand x 10;  Side stepping  at counter 3 x 10;   Self Care:     Therapeutic Exercise: Aerobic:  Supine:   SLR 2 x 10 bil( tried slight Er on R- harder/slight pain)     hip ER GTB x 20;  Seated:  LAQ 3 lb x 20 on R;  S/L: trial hip abd- grinding in hip  Standing:  squats at counter x 12;  at table x 10 ( cueing for hip and knee placement )   Hip abd 3 x 5 on R;   side stepping at counter x 10 Stretches:  Neuromuscular Re-education: Manual Therapy: Therapeutic Activity:    Self Care:   Ther ex:  Supine: SLR 2 x 10 on R;  Hip clams GTB x 15;(sore by end) ;  LAQ x 15 on L;    PATIENT EDUCATION:  Education details: updated and reviewed HEP Person educated: Patient Education method: Explanation, Demonstration, Tactile cues, Verbal cues, and Handouts Education comprehension: verbalized understanding, returned demonstration, verbal cues required, tactile cues required, and needs further education   HOME EXERCISE PROGRAM: Access Code:  [email or web address]   ASSESSMENT:  CLINICAL IMPRESSION: Pt challenged with strengthening. Encouraged more frequent HEP for improving strength.  Will benefit from continued care to ensure HEP and strength prior to surgery. Likely d/c to HEP in 1-2 more visits.   Eval: Patient presents with primary complaint of  pain in R hip. She had previous fracture with surgery last year, and continues to have pain and deficit since. She has weakness in R hip and increased pain with movement. She is overall deconditioned and would benefit from increasing mobility. She also has difficulty with balance and has had a few falls with injury. She will benefit from further evaluation of balance at future visits, and treatment of this as needed, to prevent further falls. She has decreased gait mechanics, with valgus R knee and decreased heel strike/short stride on R.  Pt with decreased ability for full functional activities. Pt will  benefit from skilled PT to improve deficits and pain and to return to PLOF.   OBJECTIVE IMPAIRMENTS: Abnormal gait, decreased activity tolerance, decreased coordination, decreased mobility, difficulty walking, decreased ROM, decreased strength, impaired flexibility, impaired vision/preception, improper body mechanics, and pain.   ACTIVITY LIMITATIONS: standing, squatting, stairs, transfers, and locomotion level  PARTICIPATION LIMITATIONS: meal prep, cleaning, laundry, driving, shopping, and community activity  PERSONAL FACTORS: Past/current experiences, Time since onset of injury/illness/exacerbation, and 1 comorbidity: surgery are also affecting patient's functional outcome.   REHAB POTENTIAL: Good  CLINICAL DECISION MAKING: Evolving/moderate complexity  EVALUATION COMPLEXITY: Moderate   GOALS: Goals reviewed with patient? Yes  SHORT TERM GOALS: Target date: 11/03/2023   Pt to be independent with initial HEP  Goal status: MET  2.  Pt to demo R hip ROM to be pain free in all directions.    Goal status: in progress    LONG TERM GOALS: Target date: 02/12/2024   Pt to be independent with final HEP  Goal status: In progress  2.  Pt to report decreased pain in R hip to 0-3/10 with activity and walking   Goal status: In progress  3.  Pt to demo improved strength of R hip to be at least 4/5 , to improve stability, gait and pain   Goal status: Met- for some directions     PLAN:  PT FREQUENCY: 1-2x/week  PT DURATION: 8 weeks  PLANNED INTERVENTIONS: Therapeutic exercises, Therapeutic activity, Neuromuscular re-education, Patient/Family education, Self Care, Joint mobilization, Joint manipulation, Stair training, Orthotic/Fit training, DME instructions, Aquatic Therapy, Dry Needling, Electrical stimulation, Cryotherapy, Moist heat, Taping, Ultrasound, Ionotophoresis  4mg /ml Dexamethasone , Manual therapy,  Vasopneumatic device, Traction, Spinal manipulation, Spinal mobilization,Balance training, Gait training,   PLAN FOR NEXT SESSION:   hip ext-  in standing , bwd walking    Tinnie Don, PT, DPT 1:52 PM  01/15/24

## 2024-01-19 ENCOUNTER — Ambulatory Visit (INDEPENDENT_AMBULATORY_CARE_PROVIDER_SITE_OTHER): Admitting: Family Medicine

## 2024-01-19 ENCOUNTER — Other Ambulatory Visit: Payer: Self-pay | Admitting: Cardiology

## 2024-01-19 ENCOUNTER — Encounter: Payer: Self-pay | Admitting: Family Medicine

## 2024-01-19 ENCOUNTER — Other Ambulatory Visit: Payer: Self-pay | Admitting: Primary Care

## 2024-01-19 ENCOUNTER — Other Ambulatory Visit: Payer: Self-pay | Admitting: Family Medicine

## 2024-01-19 VITALS — BP 156/79 | HR 73 | Temp 97.2°F | Wt 181.6 lb

## 2024-01-19 DIAGNOSIS — F411 Generalized anxiety disorder: Secondary | ICD-10-CM

## 2024-01-19 DIAGNOSIS — R0989 Other specified symptoms and signs involving the circulatory and respiratory systems: Secondary | ICD-10-CM

## 2024-01-19 DIAGNOSIS — K120 Recurrent oral aphthae: Secondary | ICD-10-CM

## 2024-01-19 DIAGNOSIS — F331 Major depressive disorder, recurrent, moderate: Secondary | ICD-10-CM

## 2024-01-19 MED ORDER — NYSTATIN 100000 UNIT/ML MT SUSP
5.0000 mL | Freq: Four times a day (QID) | OROMUCOSAL | 0 refills | Status: DC
Start: 2024-01-19 — End: 2024-02-06

## 2024-01-19 NOTE — Patient Instructions (Signed)
 [name] Address: [address] [place], [place] [address] Phone: [phone]

## 2024-01-19 NOTE — Progress Notes (Signed)
 OFFICE VISIT  01/19/2024  CC:  Chief Complaint  Patient presents with   Anxiety    Patient is a 73 y.o. female who presents for 2-week follow-up anxiety and depression. A/P as of last visit: 1 bipolar disorder, GAD. Cloudy thinking, some forgetfulness--> I want a make sure she is not overmedicated.  Will decrease her Wellbutrin  XL to 150 mg a day.  Continue citalopram  40 mg a day for now.  Consider discontinuation of Wellbutrin  completely or decrease citalopram  to 20 mg at follow-up in 2 weeks. Continue alprazolam  and Lamictal  (300 mg a day). Referral to behavioral health was made earlier this week. Gave her their contact information today.   2.  Labile hypertension. Blood pressure normal here today. She will try to limit blood pressure checks to 2 times a week. Continue HCTZ 25 mg a day, losartan  25 mg twice daily, and Toprol -XL 150 mg a day.   #3 left wrist cat bite infection. Resolved status post Augmentin  and doxycycline .  INTERIM HX: She feels better. Feels like her thinking is a little clearer and she is less tense. She still does feel like she is depressed and empty and anxious.  Her biggest issue in the last several days is painful ulcers on the tongue.  Blood pressures 140s systolic at home.  Past Medical History:  Diagnosis Date   Acute encephalopathy 11/19/2017   Alcoholism in remission [facility])    states last alcohol 11 yrs ago   Anemia    Anxiety    Bipolar 1 disorder (HCC)    Admission for mania x 2 (most recent 11/2017)   Cervical spondylosis without myelopathy 12/19/2013   MRI 02/2014: multilevel DDD/spondylosis, not much change compared to prior MRI.  Pt not in favor of invasive therapy for her neck as of 04/2014.   Chronic constipation    Chronic renal insufficiency, stage 3 (moderate)    COPD (chronic obstructive pulmonary disease) (HCC)    Moderate: anoro started 04/2017 by pulm, pt symptomatically improved and PFTs stable at f/u 06/2017.   Coronary  artery spasm 07/11/2018   nitrates=HA. Amlodipine =intol swelling.  Changed to coreg  09/03/18 by cardiologist.   Depression    Endometritis 05/2017   possible; empiric tx with Flagyl  by GYN.   Femoral neck fracture (HCC)    right, 01/28/23-->ORIF   Fibromyalgia    GERD (gastroesophageal reflux disease)    Hepatic steatosis 03/2019   u/s abd   Herpes zoster 08/07/2017   L scapular region   Hiatal hernia    History of adenomatous polyp of colon 04/2008; 08/2014   No high grade dysplasia: recall 5 yrs (Dr. Dianna, [facility])   History of basal cell carcinoma excision    NOSE   History of Helicobacter pylori infection 04/2008   +gastric biopsy (gastritis but no metaplasia, dysplasia, or malignancy identified)   History of kidney stones    History of TIA (transient ischemic attack)    secondary to HELLP syndrome 1994 post c/s--  residual memory loss   Hyperlipidemia    statin started after her NSTEMI: goal  LDL <70.   Hypertension    Ischemic cardiomyopathy 05/2018   Echo EF 45-50% in context of NSTEMI from CA spasm. 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 Magdalen 04/2017.   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  Repeat MRI brain 02/2014 stable (Dr. Smitty with no plan to f/u with neuro as of 04/2014)   NSTEMI (non-ST elevated myocardial infarction) (HCC) 05/2018   Echo EF 45-50% in context of NSTEMI from CA spasm.// Myoview  8/21: EF 66, no ischemia, low risk   Osteoarthritis of left wrist    STT and 1st [facility] jt   Osteoporosis 05/2010   2012 'penia; 06/2015 'porosis:  prolia .  DEXA 09/2017 T-score -2.2 femur neck. 11/2021 T score -2.2. Rpt 2 yrs   Pelvic  floor dysfunction    [facility] (Dr. Cam)   Postmenopausal vaginal bleeding 05/2017   x 1 small episode; GYN attempted endo bx but unable to penetrate cervix due to severe cerv stenosis (due to postmenopausal state + hx of LEEP).  Endo u/s showed thin endo lining.  Per GYN---no evidence of endomet pathology on w/u---obs as of 07/2017.   Prediabetes 06/2016   Fasting gluc 105; HbA1c at that time was 6.0%.  A1c 6.2% 12/2017.  A1c 5.7% Feb 2023.   Recurrent kidney stones 08/2013   Left ureteral calculus: Perc nephr--cystoscopy w/ureteroscopy + laser for stone removal.  Residual asymptomatic left renal nephrolithiasis <69mm present post-procedure.  Right sided hydronephrosis-persistent (on u/s)--urol ordered CT to further eval 08/2016: 1.6 cm nonobstructing stone lower pole left kidney, no hydro, plan for PCN extraction ([facility])   Sepsis (HCC)    UTI   Suprapubic discomfort 06/05/2015   TMJ (temporomandibular joint disorder)    USES  MOUTH GUARD   Toe fracture, left 12/2017   2nd toe prox phalanx (immobilization, Dr. Elner)   Vitamin D deficiency 05/2011    Past Surgical History:  Procedure Laterality Date   ABIs  11/29/2019   NORMAL   ANTERIOR CERVICAL DECOMP/DISCECTOMY FUSION  06/30/2009   C3 -- C5  AND EXPLORATION OF FUSION C5-7 W/  PLATE REMOVAL   ARTHRODESIS METATARSALPHALANGEAL JOINT (MTPJ) Left 07/08/2021   Procedure: Left hallux metatarsophalangeal joint arthrodesis;  Surgeon: Kit Rush, MD;  Location: [facility];  Service: Orthopedics;  Laterality: Left;   BACK SURGERY     BUNIONECTOMY Left    CARDIOVASCULAR STRESS TEST  12/03/2019   myoc perf im: NORMAL   CERVICAL FUSION  2002   anterior C5 -- C7   CERVICAL SPINE SURGERY  08/27/1999     C6 -- T1  LAMINECTOMY/  DISKECTOMY   CESAREAN SECTION  1994   CHOLECYSTECTOMY N/A 03/06/2020   Procedure: LAPAROSCOPIC CHOLECYSTECTOMY;  Surgeon: Lyndel Deward PARAS, MD;  Location: [facility];  Service: General;  Laterality:  N/A;   COLONOSCOPY W/ POLYPECTOMY  04/2008;08/2014   2016 tubular adenoma x 1; +diverticulosis and int/ext hemorrhoids.  06/2020 adenoma, recall 5 yrs.   CYSTOSCOPY WITH URETEROSCOPY AND STENT PLACEMENT Left 08/28/2013   Procedure: CYSTOSCOPY, RGP,  WITH URETEROSCOPY AND STENT REMOVAL;  Surgeon: Alm GORMAN Fragmin, MD;  Location: [facility];  Service: Urology;  Laterality: Left;   DEXA  09/2017   T-score -2.2 femur neck: improved compared to 06/2015.  2021 DEXA T score -2.3.  11/2021 DEXA T score -2.2.   ESOPHAGOGASTRODUODENOSCOPY  2010; 08/2014   2016 +Candidal esophagitis; Mild chronic gastritis w/intestinal metaplasia--NEG H pylori, neg for eosinophilic esoph. 06/2020 esoph candidiasis (diflucan  x 20d), o/w normal.   HAMMER TOE SURGERY Left 07/08/2021   Procedure: Second metatarsal head excision; revision Second  hammertoe correction; 2-5 percutaneus flexor tenotomies;  Surgeon: Kit Rush, MD;  [facility];  Service: Orthopedics;  Laterality: Left;   HARDWARE REMOVAL Left 07/08/2021   Procedure: Removal of deep implants from hallux proximal phalanx and First and Second metatarsals;  Surgeon: Kit Rush, MD;  Location: [facility];  Service: Orthopedics;  Laterality: Left;   HIP PINNING,CANNULATED Right 01/28/2023   Procedure: PERCUTANEOUS FIXATION OF RIGHT FEMORAL NECK;  Surgeon: Sharl Selinda Dover, MD;  Location: [facility] OR;  Service: Orthopedics;  Laterality: Right;   HOLMIUM LASER APPLICATION Left 08/28/2013   Procedure: HOLMIUM LASER APPLICATION;  Surgeon: Alm GORMAN Fragmin, MD;  Location: [facility];  Service: Urology;  Laterality: Left;   LEFT HEART CATH AND CORONARY ANGIOGRAPHY N/A 05/15/2018   Evidence for spasm in distal LAD.  Mod RCA atherosclerosis, o/w no CAD.  Procedure: LEFT HEART CATH AND CORONARY ANGIOGRAPHY;  Surgeon: Anner Alm ORN, MD;  Location: [facility];  Service: Cardiovascular;  Laterality: N/A;    NEPHROLITHOTOMY Left 08/05/2013   Procedure: NEPHROLITHOTOMY PERCUTANEOUS;  Surgeon: Alm GORMAN Fragmin, MD;  Location: [facility];  Service: Urology;  Laterality: Left;   POLYSOMNOGRAM  01/2019   NORMAL 2008 and 2020   POLYSOMNOGRAPHY  10/25/2018   Dr. Wilbert Passey due to pt inadequat sleep time (83 min).   TONSILLECTOMY AND ADENOIDECTOMY  1975   TOTAL KNEE ARTHROPLASTY Right 2003   TOTAL KNEE REVISION Right 01/19/2022   Procedure: Right knee polyethylene revision;  Surgeon: Melodi Lerner, MD;  Location: [facility];  Service: Orthopedics;  Laterality: Right;   TRANSTHORACIC ECHOCARDIOGRAM  05/2018; 12/03/2019   (after MI secondary to arterial spasm): EF 45-50%; severe hypokinesis of apical anterolateral, inferolateral , anterior, and inferior LV myocardium. 11/2019 EF 55-60%, valves fine   ULTRASOUND EXAM,PELVIC COMPLETE ([facility] HX)  06/25/2015   NORMAL (done by GYN)    Outpatient Medications Prior to Visit  Medication Sig Dispense Refill   albuterol  (VENTOLIN  HFA) 108 (90 Base) MCG/ACT inhaler TAKE 2 PUFFS BY MOUTH EVERY 6 HOURS AS NEEDED FOR WHEEZE OR SHORTNESS OF BREATH 18 each 0   ALPRAZolam  (XANAX ) 0.5 MG tablet TAKE 1-2 TABLETS BY MOUTH THREE TIMES A DAY AS NEEDED FOR ANXIETY 180 tablet 5   atorvastatin  (LIPITOR ) 80 MG tablet TAKE 1 TABLET BY MOUTH EVERY DAY 90 tablet 1   buPROPion  (WELLBUTRIN  XL) 150 MG 24 hr tablet Take 1 tablet (150 mg total) by mouth daily. 30 tablet 1   citalopram  (CELEXA ) 40 MG tablet TAKE 1 TABLET BY MOUTH EVERY DAY 30 tablet 2   famotidine  (PEPCID ) 40 MG tablet TAKE 1 TABLET BY MOUTH EVERY DAY 90 tablet 1   fluticasone  (FLONASE ) 50 MCG/ACT nasal spray SPRAY 2 SPRAYS INTO EACH NOSTRIL EVERY DAY 48 mL 1   hydrochlorothiazide  (HYDRODIURIL ) 25 MG tablet Take 1 tablet (25 mg total) by mouth daily. 90 tablet 3   HYDROcodone -acetaminophen  (NORCO/VICODIN) 5-325 MG tablet 1-2 tab po bid as needed for pain 90 tablet 0   lamoTRIgine  (LAMICTAL ) 100 MG tablet TAKE 3  TABLETS (300 MG TOTAL) BY MOUTH AT BEDTIME 270 tablet 2   loratadine  (CLARITIN ) 10 MG tablet Take 10 mg by mouth daily.     losartan  (COZAAR ) 25 MG tablet 1 tab po bid 180 tablet 3   meloxicam  (MOBIC ) 15 MG tablet Take 15 mg by mouth daily.     metoprolol  succinate (TOPROL -XL) 100 MG 24 hr tablet 1 and 1/2 tabs p.o. daily.  Take  with or immediately following a meal. 135 tablet 1   montelukast  (SINGULAIR ) 10 MG tablet TAKE 1 TABLET BY MOUTH EVERY DAY IN THE EVENING 90 tablet 1   nitroGLYCERIN  (NITROSTAT ) 0.4 MG SL tablet PLACE 1 TABLET (0.4 MG TOTAL) UNDER THE TONGUE EVERY 5 (FIVE) MINUTES X 3 DOSES AS NEEDED FOR CHEST PAIN. 25 tablet 1   ondansetron  (ZOFRAN -ODT) 4 MG disintegrating tablet Take 1 tablet (4 mg total) by mouth every 8 (eight) hours as needed. 8 tablet 0   pantoprazole  (PROTONIX ) 40 MG tablet Take 1 tablet (40 mg total) by mouth daily. PLEASE SCHEDULE F/U VISIT FOR ANY FUTURE REFILLS 90 tablet 0   potassium chloride  SA (KLOR-CON  M) 20 MEQ tablet TAKE 1 TABLET BY MOUTH EVERY DAY 90 tablet 1   Tiotropium Bromide -Olodaterol (STIOLTO RESPIMAT ) 2.5-2.5 MCG/ACT AERS Inhale 2 each into the lungs daily. 4 g 5   No facility-administered medications prior to visit.    Allergies  Allergen Reactions   Amlodipine  Swelling   Ceftin [Cefuroxime Axetil] Anaphylaxis and Swelling    Swelling of eyes and tongue   Cipro  [Ciprofloxacin  Hcl] Anaphylaxis and Swelling   Risperdal  [Risperidone ] Palpitations    Generalized weakness Malaise  Hypertension   Fosamax  [Alendronate  Sodium] Diarrhea   Linzess [Linaclotide] Diarrhea   Morphine  And Codeine Other (See Comments)    Hallucinations Disorientation    Movantik  [Naloxegol ]     N/v   Prednisone  Other (See Comments)    Hyperactivity   Roxicodone  [Oxycodone ] Other (See Comments)    Previously addicted to medication   Seroquel  Eirene.Dustman ] Other (See Comments)    Oversedation     Review of Systems As per HPI  PE:    01/19/2024   10:44  AM 01/05/2024   11:08 AM 12/22/2023    8:45 PM  Vitals with BMI  Height  5' 8   Weight 181 lbs 10 oz 183 lbs 6 oz   BMI 27.62 27.89   Systolic 156 132 834  Diastolic 79 80 81  Pulse 73 81      Physical Exam  General: Alert, well-appearing. Affect is anxious but very pleasant and interactive. Her thought and speech are lucid. There are 2 superficial ulcerations on the inferior aspect of her tongue, 1 approximately 2 to 3 mm diameter and the other about 5 mm diameter.  Gingiva, buccal mucosa, and palate are without any erythema, swelling, or ulceration.  LABS:  Last metabolic panel Lab Results  Component Value Date   GLUCOSE 109 (H) 12/22/2023   NA 134 (L) 12/22/2023   K 4.5 12/22/2023   CL 98 12/22/2023   CO2 22 12/22/2023   BUN 15 12/22/2023   CREATININE 1.00 12/22/2023   GFRNONAA >60 12/22/2023   CALCIUM  9.7 12/22/2023   PROT 6.8 12/22/2023   ALBUMIN  4.6 12/22/2023   BILITOT 0.5 12/22/2023   ALKPHOS 70 12/22/2023   AST 20 12/22/2023   ALT 19 12/22/2023   ANIONGAP 14 12/22/2023   IMPRESSION AND PLAN:  #1 aphthous ulcers of tongue Magic mouthwash prescribed.  2.  Bipolar affective disorder, depressed phase. She is improved with backing off some on her Wellbutrin .  Continue 150 mg daily and continue citalopram  40 mg a day and Lamictal  300 mg a day. Continue alprazolam  0.5 mg tab, 1-2 3 times daily as needed.  #3 hypertension, labile. Adequate control currently. Continue losartan  25 mg twice a day, hydrochlorothiazide  25 mg a day, and Toprol -XL 150 mg a day.  An After Visit Summary was printed  and given to the patient.  FOLLOW UP: Return in about 2 weeks (around 02/02/2024) for f/u anx/dep.  Signed:  Gerlene Hockey, MD           01/19/2024

## 2024-01-22 ENCOUNTER — Ambulatory Visit: Admitting: Physical Therapy

## 2024-01-22 ENCOUNTER — Other Ambulatory Visit: Payer: Self-pay | Admitting: Primary Care

## 2024-01-22 ENCOUNTER — Encounter: Payer: Self-pay | Admitting: Physical Therapy

## 2024-01-22 DIAGNOSIS — M25551 Pain in right hip: Secondary | ICD-10-CM

## 2024-01-22 DIAGNOSIS — R2689 Other abnormalities of gait and mobility: Secondary | ICD-10-CM | POA: Diagnosis not present

## 2024-01-22 NOTE — Telephone Encounter (Unsigned)
 Copied from CRM [id number]. Topic: Clinical - Medication Refill >> Jan 22, 2024  5:02 PM Rozanna G wrote: Medication: Tiotropium Bromide -Olodaterol (STIOLTO RESPIMAT ) 2.5-2.5 MCG/ACT AERS  Has the patient contacted their pharmacy? Yes (Agent: If no, request that the patient contact the pharmacy for the refill. If patient does not wish to contact the pharmacy document the reason why and proceed with request.) (Agent: If yes, when and what did the pharmacy advise?)  This is the patient's preferred pharmacy:  [id number] [place], [place] - [address] [place] [address] Phone: [phone] Fax: [phone]  Is this the correct pharmacy for this prescription? Yes If no, delete pharmacy and type the correct one.   Has the prescription been filled recently? Yes  Is the patient out of the medication? Yes  Has the patient been seen for an appointment in the last year OR does the patient have an upcoming appointment? Yes  Can we respond through MyChart? Yes  Agent: Please be advised that Rx refills may take up to 3 business days. We ask that you follow-up with your pharmacy.

## 2024-01-22 NOTE — Therapy (Signed)
 OUTPATIENT PHYSICAL THERAPY LOWER EXTREMITY TREATMENT   Patient Name: Erin Lopez MRN: [id number] DOB:05/07/1950, 73 y.o., female Today's Date: 01/22/2024  END OF SESSION:  PT End of Session - 01/22/24 1406     Visit Number 12    Number of Visits 16    Date for Recertification  02/12/24    Authorization Type [employer], rcert done at visit 8    PT Start Time 1350    PT Stop Time 1430    PT Time Calculation (min) 40 min    Activity Tolerance Patient tolerated treatment well    Behavior During Therapy [facility] for tasks assessed/performed                Past Medical History:  Diagnosis Date   Acute encephalopathy 11/19/2017   Alcoholism in remission (HCC)    states last alcohol 11 yrs ago   Anemia    Anxiety    Bipolar 1 disorder (HCC)    Admission for mania x 2 (most recent 11/2017)   Cervical spondylosis without myelopathy 12/19/2013   MRI 02/2014: multilevel DDD/spondylosis, not much change compared to prior MRI.  Pt not in favor of invasive therapy for her neck as of 04/2014.   Chronic constipation    Chronic renal insufficiency, stage 3 (moderate)    COPD (chronic obstructive pulmonary disease) (HCC)    Moderate: anoro started 04/2017 by pulm, pt symptomatically improved and PFTs stable at f/u 06/2017.   Coronary artery spasm 07/11/2018   nitrates=HA. Amlodipine =intol swelling.  Changed to coreg  09/03/18 by cardiologist.   Depression    Endometritis 05/2017   possible; empiric tx with Flagyl  by GYN.   Femoral neck fracture (HCC)    right, 01/28/23-->ORIF   Fibromyalgia    GERD (gastroesophageal reflux disease)    Hepatic steatosis 03/2019   u/s abd   Herpes zoster 08/07/2017   L scapular region   Hiatal hernia    History of adenomatous polyp of colon 04/2008; 08/2014   No high grade dysplasia: recall 5 yrs (Dr. Dianna, [facility])   History of basal cell carcinoma excision    NOSE   History of Helicobacter pylori infection 04/2008   +gastric biopsy  (gastritis but no metaplasia, dysplasia, or malignancy identified)   History of kidney stones    History of TIA (transient ischemic attack)    secondary to HELLP syndrome 1994 post c/s--  residual memory loss   Hyperlipidemia    statin started after her NSTEMI: goal  LDL <70.   Hypertension    Ischemic cardiomyopathy 05/2018   Echo EF 45-50% in context of NSTEMI from CA spasm. 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 Magdalen 04/2017.   MCI (mild cognitive impairment) with memory loss    Memory loss    Abnl MRI brain and CT brain c/w chronic microvascular ischemia.  Repeat MRI brain 02/2014 stable (Dr. Smitty with no plan to f/u with neuro as of 04/2014)   NSTEMI (non-ST elevated myocardial infarction) (HCC) 05/2018   Echo EF 45-50% in context of NSTEMI from CA spasm.// Myoview  8/21: EF 66, no ischemia, low risk   Osteoarthritis of left wrist    STT and 1st [facility] jt   Osteoporosis 05/2010   2012 'penia; 06/2015 'porosis:  prolia .  DEXA  09/2017 T-score -2.2 femur neck. 11/2021 T score -2.2. Rpt 2 yrs   Pelvic floor dysfunction    [facility] (Dr. Cam)   Postmenopausal vaginal bleeding 05/2017   x 1 small episode; GYN attempted endo bx but unable to penetrate cervix due to severe cerv stenosis (due to postmenopausal state + hx of LEEP).  Endo u/s showed thin endo lining.  Per GYN---no evidence of endomet pathology on w/u---obs as of 07/2017.   Prediabetes 06/2016   Fasting gluc 105; HbA1c at that time was 6.0%.  A1c 6.2% 12/2017.  A1c 5.7% Feb 2023.   Recurrent kidney stones 08/2013   Left ureteral calculus: Perc nephr--cystoscopy w/ureteroscopy + laser for stone removal.  Residual asymptomatic left renal nephrolithiasis <58mm present post-procedure.   Right sided hydronephrosis-persistent (on u/s)--urol ordered CT to further eval 08/2016: 1.6 cm nonobstructing stone lower pole left kidney, no hydro, plan for PCN extraction ([facility])   Sepsis (HCC)    UTI   Suprapubic discomfort 06/05/2015   TMJ (temporomandibular joint disorder)    USES  MOUTH GUARD   Toe fracture, left 12/2017   2nd toe prox phalanx (immobilization, Dr. Elner)   Vitamin D deficiency 05/2011   Past Surgical History:  Procedure Laterality Date   ABIs  11/29/2019   NORMAL   ANTERIOR CERVICAL DECOMP/DISCECTOMY FUSION  06/30/2009   C3 -- C5  AND EXPLORATION OF FUSION C5-7 W/  PLATE REMOVAL   ARTHRODESIS METATARSALPHALANGEAL JOINT (MTPJ) Left 07/08/2021   Procedure: Left hallux metatarsophalangeal joint arthrodesis;  Surgeon: Kit Rush, MD;  Location: [facility];  Service: Orthopedics;  Laterality: Left;   BACK SURGERY     BUNIONECTOMY Left    CARDIOVASCULAR STRESS TEST  12/03/2019   myoc perf im: NORMAL   CERVICAL FUSION  2002   anterior C5 -- C7   CERVICAL SPINE SURGERY  08/27/1999     C6 -- T1  LAMINECTOMY/  DISKECTOMY   CESAREAN SECTION  1994   CHOLECYSTECTOMY N/A 03/06/2020   Procedure: LAPAROSCOPIC CHOLECYSTECTOMY;  Surgeon: Lyndel Deward PARAS, MD;  Location: [facility];  Service: General;  Laterality: N/A;   COLONOSCOPY W/ POLYPECTOMY  04/2008;08/2014   2016 tubular adenoma x 1; +diverticulosis and int/ext hemorrhoids.  06/2020 adenoma, recall 5 yrs.   CYSTOSCOPY WITH URETEROSCOPY AND STENT PLACEMENT Left 08/28/2013   Procedure: CYSTOSCOPY, RGP,  WITH URETEROSCOPY AND STENT REMOVAL;  Surgeon: Alm GORMAN Fragmin, MD;  Location: [facility];  Service: Urology;  Laterality: Left;   DEXA  09/2017   T-score -2.2 femur neck: improved compared to 06/2015.  2021 DEXA T score -2.3.  11/2021 DEXA T score -2.2.   ESOPHAGOGASTRODUODENOSCOPY  2010; 08/2014   2016 +Candidal esophagitis; Mild chronic gastritis w/intestinal metaplasia--NEG H pylori, neg for  eosinophilic esoph. 06/2020 esoph candidiasis (diflucan  x 20d), o/w normal.   HAMMER TOE SURGERY Left 07/08/2021   Procedure: Second metatarsal head excision; revision Second hammertoe correction; 2-5 percutaneus flexor tenotomies;  Surgeon: Kit Rush, MD;  Location: [facility];  Service: Orthopedics;  Laterality: Left;   HARDWARE REMOVAL Left 07/08/2021   Procedure: Removal of deep implants from hallux proximal phalanx and First and Second metatarsals;  Surgeon: Kit Rush, MD;  Location: [facility];  Service: Orthopedics;  Laterality: Left;   HIP PINNING,CANNULATED Right 01/28/2023   Procedure: PERCUTANEOUS FIXATION OF RIGHT FEMORAL NECK;  Surgeon: Sharl Selinda Dover, MD;  Location: [facility];  Service: Orthopedics;  Laterality: Right;   HOLMIUM LASER APPLICATION Left 08/28/2013  Procedure: HOLMIUM LASER APPLICATION;  Surgeon: Alm GORMAN Fragmin, MD;  Location[facility];  Service: Urology;  Laterality: Left;   LEFT HEART CATH AND CORONARY ANGIOGRAPHY N/A 05/15/2018   Evidence for spasm in distal LAD.  Mod RCA atherosclerosis, o/w no CAD.  Procedure: LEFT HEART CATH AND CORONARY ANGIOGRAPHY;  Surgeon: Anner Alm ORN, MD;  Location: [facility];  Service: Cardiovascular;  Laterality: N/A;   NEPHROLITHOTOMY Left 08/05/2013   Procedure: NEPHROLITHOTOMY PERCUTANEOUS;  Surgeon: Alm GORMAN Fragmin, MD;  Location: [facility];  Service: Urology;  Laterality: Left;   POLYSOMNOGRAM  01/2019   NORMAL 2008 and 2020   POLYSOMNOGRAPHY  10/25/2018   Dr. Wilbert Passey due to pt inadequat sleep time (83 min).   TONSILLECTOMY AND ADENOIDECTOMY  1975   TOTAL KNEE ARTHROPLASTY Right 2003   TOTAL KNEE REVISION Right 01/19/2022   Procedure: Right knee polyethylene revision;  Surgeon: Melodi Lerner, MD;  Location: [facility];  Service: Orthopedics;  Laterality: Right;   TRANSTHORACIC ECHOCARDIOGRAM  05/2018; 12/03/2019   (after MI secondary to arterial  spasm): EF 45-50%; severe hypokinesis of apical anterolateral, inferolateral , anterior, and inferior LV myocardium. 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   Healthcare maintenance 04/19/2019   Overweight (BMI 25.0-29.9) 02/19/2019   Ischemic cardiomyopathy 09/03/2018   Coronary vasospasm 07/11/2018   NSTEMI (non-ST elevated myocardial infarction) (HCC) 05/14/2018   Hyponatremia 11/16/2017   Bipolar 1 disorder (HCC) 12/29/2016   Hypertension 12/29/2016   History of TIA (transient ischemic attack) 12/29/2016   Chronic pelvic pain in female 08/22/2016   Stage 2 chronic kidney disease 08/22/2016   Osteoporosis 09/30/2015   Health maintenance examination 06/11/2015   Urethral caruncle 06/05/2015   UTI (urinary tract infection) 06/05/2015   TMJ (temporomandibular joint disorder) 02/25/2015   Right ankle injury 11/05/2014   Cervical spondylosis without myelopathy 12/19/2013   Sprain of neck 12/19/2013   Memory difficulties 12/19/2013   Postnasal drip 10/28/2013   Constipation, chronic 07/29/2013   COPD (chronic obstructive pulmonary disease) (HCC) 06/30/2013   Kidney stone on left side 05/29/2013   Hyperlipidemia 05/29/2013     PCP: Phiip McGowen  REFERRING PROVIDER: Phiip McGowen  REFERRING DIAG: R hip pain   THERAPY DIAG:  Pain in right hip  Other abnormalities of gait and mobility  Rationale for Evaluation and Treatment: Rehabilitation  ONSET DATE:  01/28/23   SUBJECTIVE:   SUBJECTIVE STATEMENT: Pt with no new complaints.   Eval: Pt had fall, suffering R femur fracture on 01/28/23 last year.  She  had fixation of femoral neck by Dr. Sharl. States difficulty in hospital and recovering from this, had sepsis, etc. She had difficulty with mobility after this, did not have much PT, had some home heath. States R hip still sore, notes pain in lateral hip with activity and walking.  She also had another fall in April at home, fell hard, broke nose, hurt face, concussion and broke R wrist. Still wearing brace on R hand, but reports wrist doing well at this time.  She notes difficulty with BP lately, being high and low. States difficulty  with motivation lately, and feels like she has no energy- MD aware.  Reports sleeping well at night.  Lives alone, does have life alert and does drive.  States she feels something moving in hip when she turns her leg a certain direction ( S/L Hip er/ir)    PERTINENT HISTORY: CKD, HTN,  TIA,  COPD, Osteoporosis, Bipolar,  R TKA and revision  PAIN:  Are you having pain? Yes: NPRS scale: up to 5/10 Pain location: R lateral hip Pain description: sore Aggravating factors: stairs, moving sideways,  Relieving factors: rest/sitting   PRECAUTIONS: Fall  WEIGHT BEARING RESTRICTIONS: No  FALLS:  Has patient fallen in last 6 months? Yes. Number of falls 2 - broke R wrist/outside got out of car,     PLOF: Independent   PATIENT GOALS:  Decreased pain in hip, increased activity level.   NEXT MD VISIT:   OBJECTIVE:   DIAGNOSTIC FINDINGS:   PATIENT SURVEYS:   COGNITION: Overall cognitive status: Within functional limits for tasks assessed     SENSATION: WFL  EDEMA: n/a   POSTURE:    No Significant postural limitations  PALPATION: Tenderness at lateral R hip, and gr trochanter   LOWER EXTREMITY ROM: Hip flex, IR, ER- mild limitations Knees: WFL   LOWER EXTREMITY MMT:  MMT Left eval Right  eval 11/27/2023 Right 12/18/23 Right 10/20 Right  Hip flexion 4+ 4- 4  4 4+  Hip extension       Hip abduction  3/pain 4- 4- (crepitus)  4- (Crepitus)    Hip adduction       Hip internal rotation       Hip external rotation 4+ 4 4 4+   Knee flexion 5 4+  5   Knee extension 5 4  4+   Ankle dorsiflexion       Ankle plantarflexion       Ankle inversion       Ankle eversion        (Blank rows = not tested)   LOWER EXTREMITY SPECIAL TESTS:    FUNCTIONAL TESTS:     GAIT: Distance walked: 150 Assistive device utilized: None Level of assistance: Complete Independence Comments: R valgus knee, decreased knee extension and heel strike on R.    TODAY'S TREATMENT:                                                                                                                              DATE:   01/22/2024 Therapeutic Exercise: Aerobic:  Bike L1, slow x 8 min  Supine:    SKTC 30 sec x 3 bil;   LTR x 10;  Bridging 2 x 10 ;  Clams Blue TB x 20  Seated:   pelvic tilts x 20;  S/L:   Standing:   hip abd 2 x 10 bil ,   Stretches:  Neuromuscular Re-education:  Manual Therapy: Therapeutic Activity:  mini squats at counter x 15 with cueing for  mechanics.  Side stepping  at counter 3 x 10;   Step ups 4 in x 5 bil;  stairs up/down 5 3 steps ( 4 in) x 5;  Self Care:     Therapeutic Exercise: Aerobic: Supine:   SLR 2 x 10 bil( tried slight Er on R- harder/slight pain)     hip ER GTB x 20;  Seated:  LAQ 3 lb x 20 on R;  S/L: trial hip abd- grinding in hip  Standing:  squats at counter x 12;  at table x 10 ( cueing for hip and knee placement )   Hip abd 3 x 5 on R;   side stepping at counter x 10 Stretches:  Neuromuscular Re-education: Manual Therapy: Therapeutic Activity:    Self Care:   Ther ex:  Supine: SLR 2 x 10 on R;  Hip clams GTB x 15;(sore by end) ;  LAQ x 15 on L;    PATIENT EDUCATION:  Education details: updated and reviewed HEP Person educated: Patient Education method: Explanation, Demonstration, Tactile cues, Verbal cues, and Handouts Education comprehension: verbalized understanding, returned demonstration,  verbal cues required, tactile cues required, and needs further education   HOME EXERCISE PROGRAM: Access Code: [email or web address]   ASSESSMENT:  CLINICAL IMPRESSION: Pt Will benefit from continued strengthening with HEP, in time leading up to possible hip surgery. Final Hep reviewed today, pt with good understanding. She has made good improvements in strength and postural awareness. She is still having crepitus with s/l position, hip abd, and step ups, which has made progression in these positions difficult. Pt has met most goals at this time, pt in agreement with plan. Pt will follow up with Ortho.   Eval: Patient presents with primary complaint of  pain in R hip. She had previous fracture with surgery last year, and continues to have pain and deficit since. She has weakness in R hip and increased pain with movement. She is overall deconditioned and would benefit from increasing mobility. She also has difficulty with balance and has had a few falls with injury. She will benefit from further evaluation of balance at future visits, and treatment of this as needed, to prevent further falls. She has decreased gait mechanics, with valgus R knee and decreased heel strike/short stride on R.  Pt with decreased ability for full functional activities. Pt will  benefit from skilled PT to improve deficits and pain and to return to PLOF.   OBJECTIVE IMPAIRMENTS: Abnormal gait, decreased activity tolerance, decreased coordination, decreased mobility, difficulty walking, decreased ROM, decreased strength, impaired flexibility, impaired vision/preception, improper body mechanics, and pain.   ACTIVITY LIMITATIONS: standing, squatting, stairs, transfers, and locomotion level  PARTICIPATION LIMITATIONS: meal prep, cleaning, laundry, driving, shopping, and community activity  PERSONAL FACTORS: Past/current experiences, Time since onset of injury/illness/exacerbation, and 1 comorbidity: surgery are also affecting patient's  functional outcome.   REHAB POTENTIAL: Good  CLINICAL DECISION MAKING: Evolving/moderate complexity  EVALUATION COMPLEXITY: Moderate   GOALS: Goals reviewed with patient? Yes  SHORT TERM GOALS: Target date: 11/03/2023   Pt to be independent with initial HEP  Goal status: MET  2.  Pt to demo R hip ROM to be pain free in all directions.   Goal status: in progress    LONG TERM GOALS: Target date: 02/12/2024   Pt to be independent with final HEP  Goal status: MET  2.  Pt to report decreased pain in R hip to 0-3/10 with activity and walking   Goal status: MET  3.  Pt to demo improved strength of R hip to be at least 4/5 , to improve stability, gait and pain   Goal status: Met- for some directions , not abduction.     PLAN:  PT FREQUENCY: 1-2x/week  PT DURATION: 8 weeks  PLANNED INTERVENTIONS: Therapeutic exercises, Therapeutic activity, Neuromuscular re-education, Patient/Family education, Self Care, Joint mobilization, Joint manipulation, Stair training, Orthotic/Fit training, DME instructions, Aquatic Therapy, Dry Needling, Electrical stimulation, Cryotherapy, Moist heat, Taping, Ultrasound, Ionotophoresis 4mg /ml Dexamethasone , Manual therapy,  Vasopneumatic device, Traction, Spinal manipulation, Spinal mobilization,Balance training, Gait training,   PLAN FOR NEXT SESSION:     Tinnie Don, PT, DPT 2:06 PM  01/22/24    PHYSICAL THERAPY DISCHARGE SUMMARY  Visits from Start of Care: 12   Plan: Patient agrees to discharge.  Patient goals were  met. Patient is being discharged due to meeting the stated rehab goals.

## 2024-01-23 NOTE — Telephone Encounter (Signed)
 No further action needed at this time.

## 2024-01-24 ENCOUNTER — Ambulatory Visit

## 2024-01-25 ENCOUNTER — Ambulatory Visit: Admitting: Family Medicine

## 2024-01-25 DIAGNOSIS — S72001D Fracture of unspecified part of neck of right femur, subsequent encounter for closed fracture with routine healing: Secondary | ICD-10-CM | POA: Diagnosis not present

## 2024-01-29 ENCOUNTER — Encounter: Admitting: Physical Therapy

## 2024-02-02 DIAGNOSIS — A419 Sepsis, unspecified organism: Secondary | ICD-10-CM | POA: Diagnosis not present

## 2024-02-02 DIAGNOSIS — M542 Cervicalgia: Secondary | ICD-10-CM | POA: Diagnosis not present

## 2024-02-02 DIAGNOSIS — J449 Chronic obstructive pulmonary disease, unspecified: Secondary | ICD-10-CM | POA: Diagnosis not present

## 2024-02-02 DIAGNOSIS — E785 Hyperlipidemia, unspecified: Secondary | ICD-10-CM | POA: Diagnosis not present

## 2024-02-02 DIAGNOSIS — F319 Bipolar disorder, unspecified: Secondary | ICD-10-CM | POA: Diagnosis not present

## 2024-02-02 DIAGNOSIS — R7303 Prediabetes: Secondary | ICD-10-CM | POA: Diagnosis not present

## 2024-02-02 DIAGNOSIS — M25561 Pain in right knee: Secondary | ICD-10-CM | POA: Diagnosis not present

## 2024-02-02 DIAGNOSIS — F419 Anxiety disorder, unspecified: Secondary | ICD-10-CM | POA: Diagnosis not present

## 2024-02-02 DIAGNOSIS — H259 Unspecified age-related cataract: Secondary | ICD-10-CM | POA: Diagnosis not present

## 2024-02-02 DIAGNOSIS — F329 Major depressive disorder, single episode, unspecified: Secondary | ICD-10-CM | POA: Diagnosis not present

## 2024-02-02 DIAGNOSIS — I10 Essential (primary) hypertension: Secondary | ICD-10-CM | POA: Diagnosis not present

## 2024-02-02 DIAGNOSIS — N390 Urinary tract infection, site not specified: Secondary | ICD-10-CM | POA: Diagnosis not present

## 2024-02-05 ENCOUNTER — Encounter: Admitting: Physical Therapy

## 2024-02-05 NOTE — Patient Instructions (Incomplete)
   Please schedule your bone density scan   [facility]  [address] [place] [address] Phone: [phone] Hours: Mon-Fri 7:30am-5pm

## 2024-02-06 ENCOUNTER — Encounter: Payer: Self-pay | Admitting: Family Medicine

## 2024-02-06 ENCOUNTER — Telehealth: Payer: Self-pay

## 2024-02-06 ENCOUNTER — Ambulatory Visit: Admitting: Family Medicine

## 2024-02-06 VITALS — BP 125/73 | HR 65 | Temp 98.8°F | Resp 12 | Wt 182.0 lb

## 2024-02-06 DIAGNOSIS — R0989 Other specified symptoms and signs involving the circulatory and respiratory systems: Secondary | ICD-10-CM

## 2024-02-06 DIAGNOSIS — Z0000 Encounter for general adult medical examination without abnormal findings: Secondary | ICD-10-CM | POA: Diagnosis not present

## 2024-02-06 DIAGNOSIS — F319 Bipolar disorder, unspecified: Secondary | ICD-10-CM

## 2024-02-06 DIAGNOSIS — Z87448 Personal history of other diseases of urinary system: Secondary | ICD-10-CM

## 2024-02-06 DIAGNOSIS — F411 Generalized anxiety disorder: Secondary | ICD-10-CM

## 2024-02-06 MED ORDER — BUPROPION HCL ER (XL) 150 MG PO TB24: 150.0000 mg | ORAL_TABLET | Freq: Every day | ORAL | 3 refills | Status: AC

## 2024-02-06 MED ORDER — METOPROLOL SUCCINATE ER 100 MG PO TB24: ORAL_TABLET | ORAL | Status: DC

## 2024-02-06 MED ORDER — ATORVASTATIN CALCIUM 80 MG PO TABS
80.0000 mg | ORAL_TABLET | Freq: Every day | ORAL | 3 refills | Status: AC
Start: 2024-02-06 — End: ?

## 2024-02-06 MED ORDER — POTASSIUM CHLORIDE CRYS ER 20 MEQ PO TBCR: 20.0000 meq | EXTENDED_RELEASE_TABLET | Freq: Every day | ORAL | 3 refills | Status: AC

## 2024-02-06 MED ORDER — FAMOTIDINE 40 MG PO TABS: 40.0000 mg | ORAL_TABLET | Freq: Every day | ORAL | 3 refills | Status: AC

## 2024-02-06 MED ORDER — CITALOPRAM HYDROBROMIDE 40 MG PO TABS: 40.0000 mg | ORAL_TABLET | Freq: Every day | ORAL | 3 refills | Status: AC

## 2024-02-06 NOTE — Progress Notes (Signed)
 OFFICE VISIT  02/06/2024  CC:  Chief Complaint  Patient presents with   Medical Management of Chronic Issues   Medicare Wellness    Patient is a 73 y.o. female who presents for 2-week follow-up depression and anxiety. A/P as of last visit: 1 aphthous ulcers of tongue Magic mouthwash prescribed.   2.  Bipolar affective disorder, depressed phase. She is improved with backing off some on her Wellbutrin .  Continue 150 mg daily and continue citalopram  40 mg a day and Lamictal  300 mg a day. Continue alprazolam  0.5 mg tab, 1-2 3 times daily as needed.   #3 hypertension, labile. Adequate control currently. Continue losartan  25 mg twice a day, hydrochlorothiazide  25 mg a day, and Toprol -XL 150 mg a day.  INTERIM HX: [name] feels better from an emotional/mental standpoint.  Has a little bit of racing thoughts consistent with her past history of hypomania but says she overall feels good.  No excessive impulsivity.  She feels motivated more than usual.  She is going to [employer] studies. Anxiety pretty well stable.  Blood pressure is significantly elevated most of the time, often up in the 150s to 160s. Notes urine pretty foamy lately.  ROS as above, plus--> no fevers, no CP, no SOB, no wheezing, no cough, no dizziness, no HAs, no rashes, no melena/hematochezia.  No polyuria or polydipsia.  No myalgias or arthralgias.  No focal weakness, paresthesias, or tremors.  No acute vision or hearing abnormalities.  No dysuria or unusual/new urinary urgency or frequency.  No recent changes in lower legs. No n/v/d or abd pain.  No palpitations.    Past Medical History:  Diagnosis Date   Acute encephalopathy 11/19/2017   Alcoholism in remission [facility])    states last alcohol 11 yrs ago   Anemia    Anxiety    Bipolar 1 disorder (HCC)    Admission for mania x 2 (most recent 11/2017)   Cervical spondylosis without myelopathy 12/19/2013   MRI 02/2014: multilevel DDD/spondylosis, not much change compared to  prior MRI.  Pt not in favor of invasive therapy for her neck as of 04/2014.   Chronic constipation    Chronic renal insufficiency, stage 3 (moderate)    COPD (chronic obstructive pulmonary disease) (HCC)    Moderate: anoro started 04/2017 by pulm, pt symptomatically improved and PFTs stable at f/u 06/2017.   Coronary artery spasm 07/11/2018   nitrates=HA. Amlodipine =intol swelling.  Changed to coreg  09/03/18 by cardiologist.   Depression    Endometritis 05/2017   possible; empiric tx with Flagyl  by GYN.   Femoral neck fracture (HCC)    right, 01/28/23-->ORIF   Fibromyalgia    GERD (gastroesophageal reflux disease)    Hepatic steatosis 03/2019   u/s abd   Herpes zoster 08/07/2017   L scapular region   Hiatal hernia    History of adenomatous polyp of colon 04/2008; 08/2014   No high grade dysplasia: recall 5 yrs (Dr. Dianna, [facility])   History of basal cell carcinoma excision    NOSE   History of Helicobacter pylori infection 04/2008   +gastric biopsy (gastritis but no metaplasia, dysplasia, or malignancy identified)   History of kidney stones    History of TIA (transient ischemic attack)    secondary to HELLP syndrome 1994 post c/s--  residual memory loss   Hyperlipidemia    statin started after her NSTEMI: goal  LDL <70.   Hypertension    Ischemic cardiomyopathy 05/2018   Echo EF 45-50% in context of  NSTEMI from CA spasm. 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 Magdalen 04/2017.   MCI (mild cognitive impairment) with memory loss    Memory loss    Abnl MRI brain and CT brain c/w chronic microvascular ischemia.  Repeat MRI brain 02/2014 stable (Dr. Smitty with no plan to f/u with neuro as of 04/2014)   NSTEMI (non-ST elevated myocardial  infarction) (HCC) 05/2018   Echo EF 45-50% in context of NSTEMI from CA spasm.// Myoview  8/21: EF 66, no ischemia, low risk   Osteoarthritis of left wrist    STT and 1st [facility] jt   Osteoporosis 05/2010   2012 'penia; 06/2015 'porosis:  prolia .  DEXA 09/2017 T-score -2.2 femur neck. 11/2021 T score -2.2. Rpt 2 yrs   Pelvic floor dysfunction    [facility] (Dr. Cam)   Postmenopausal vaginal bleeding 05/2017   x 1 small episode; GYN attempted endo bx but unable to penetrate cervix due to severe cerv stenosis (due to postmenopausal state + hx of LEEP).  Endo u/s showed thin endo lining.  Per GYN---no evidence of endomet pathology on w/u---obs as of 07/2017.   Prediabetes 06/2016   Fasting gluc 105; HbA1c at that time was 6.0%.  A1c 6.2% 12/2017.  A1c 5.7% Feb 2023.   Recurrent kidney stones 08/2013   Left ureteral calculus: Perc nephr--cystoscopy w/ureteroscopy + laser for stone removal.  Residual asymptomatic left renal nephrolithiasis <72mm present post-procedure.  Right sided hydronephrosis-persistent (on u/s)--urol ordered CT to further eval 08/2016: 1.6 cm nonobstructing stone lower pole left kidney, no hydro, plan for PCN extraction ([facility])   Sepsis (HCC)    UTI   Suprapubic discomfort 06/05/2015   TMJ (temporomandibular joint disorder)    USES  MOUTH GUARD   Toe fracture, left 12/2017   2nd toe prox phalanx (immobilization, Dr. Elner)   Vitamin D deficiency 05/2011    Past Surgical History:  Procedure Laterality Date   ABIs  11/29/2019   NORMAL   ANTERIOR CERVICAL DECOMP/DISCECTOMY FUSION  06/30/2009   C3 -- C5  AND EXPLORATION OF FUSION C5-7 W/  PLATE REMOVAL   ARTHRODESIS METATARSALPHALANGEAL JOINT (MTPJ) Left 07/08/2021   Procedure: Left hallux metatarsophalangeal joint arthrodesis;  Surgeon: Kit Rush, MD;  Location: [facility];  Service: Orthopedics;  Laterality: Left;   BACK SURGERY     BUNIONECTOMY Left    CARDIOVASCULAR STRESS TEST  12/03/2019   myoc perf  im: NORMAL   CERVICAL FUSION  2002   anterior C5 -- C7   CERVICAL SPINE SURGERY  08/27/1999     C6 -- T1  LAMINECTOMY/  DISKECTOMY   CESAREAN SECTION  1994   CHOLECYSTECTOMY N/A 03/06/2020   Procedure: LAPAROSCOPIC CHOLECYSTECTOMY;  Surgeon: Lyndel Deward PARAS, MD;  Location: [facility];  Service: General;  Laterality: N/A;   COLONOSCOPY W/ POLYPECTOMY  04/2008;08/2014   2016 tubular adenoma x 1; +diverticulosis and int/ext hemorrhoids.  06/2020 adenoma, recall 5 yrs.   CYSTOSCOPY WITH URETEROSCOPY AND STENT PLACEMENT Left 08/28/2013   Procedure: CYSTOSCOPY, RGP,  WITH URETEROSCOPY AND STENT REMOVAL;  Surgeon: Alm GORMAN Fragmin, MD;  Location: [facility];  Service: Urology;  Laterality: Left;   DEXA  09/2017   T-score -2.2 femur neck: improved compared to 06/2015.  2021 DEXA T score -  2.3.  11/2021 DEXA T score -2.2.   ESOPHAGOGASTRODUODENOSCOPY  2010; 08/2014   2016 +Candidal esophagitis; Mild chronic gastritis w/intestinal metaplasia--NEG H pylori, neg for eosinophilic esoph. 06/2020 esoph candidiasis (diflucan  x 20d), o/w normal.   HAMMER TOE SURGERY Left 07/08/2021   Procedure: Second metatarsal head excision; revision Second hammertoe correction; 2-5 percutaneus flexor tenotomies;  Surgeon: Kit Rush, MD;  Location: [facility];  Service: Orthopedics;  Laterality: Left;   HARDWARE REMOVAL Left 07/08/2021   Procedure: Removal of deep implants from hallux proximal phalanx and First and Second metatarsals;  Surgeon: Kit Rush, MD;  Location: [facility];  Service: Orthopedics;  Laterality: Left;   HIP PINNING,CANNULATED Right 01/28/2023   Procedure: PERCUTANEOUS FIXATION OF RIGHT FEMORAL NECK;  Surgeon: Sharl Selinda Dover, MD;  Location: [facility];  Service: Orthopedics;  Laterality: Right;   HOLMIUM LASER APPLICATION Left 08/28/2013   Procedure: HOLMIUM LASER APPLICATION;  Surgeon: Alm GORMAN Fragmin, MD;  Location: [facility];  Service:  Urology;  Laterality: Left;   LEFT HEART CATH AND CORONARY ANGIOGRAPHY N/A 05/15/2018   Evidence for spasm in distal LAD.  Mod RCA atherosclerosis, o/w no CAD.  Procedure: LEFT HEART CATH AND CORONARY ANGIOGRAPHY;  Surgeon: Anner Alm ORN, MD;  Location: [facility];  Service: Cardiovascular;  Laterality: N/A;   NEPHROLITHOTOMY Left 08/05/2013   Procedure: NEPHROLITHOTOMY PERCUTANEOUS;  Surgeon: Alm GORMAN Fragmin, MD;  Location: [facility];  Service: Urology;  Laterality: Left;   POLYSOMNOGRAM  01/2019   NORMAL 2008 and 2020   POLYSOMNOGRAPHY  10/25/2018   Dr. Wilbert Passey due to pt inadequat sleep time (83 min).   TONSILLECTOMY AND ADENOIDECTOMY  1975   TOTAL KNEE ARTHROPLASTY Right 2003   TOTAL KNEE REVISION Right 01/19/2022   Procedure: Right knee polyethylene revision;  Surgeon: Melodi Lerner, MD;  Location: [facility];  Service: Orthopedics;  Laterality: Right;   TRANSTHORACIC ECHOCARDIOGRAM  05/2018; 12/03/2019   (after MI secondary to arterial spasm): EF 45-50%; severe hypokinesis of apical anterolateral, inferolateral , anterior, and inferior LV myocardium. 11/2019 EF 55-60%, valves fine   ULTRASOUND EXAM,PELVIC COMPLETE ([facility] HX)  06/25/2015   NORMAL (done by GYN)    Outpatient Medications Prior to Visit  Medication Sig Dispense Refill   albuterol  (VENTOLIN  HFA) 108 (90 Base) MCG/ACT inhaler TAKE 2 PUFFS BY MOUTH EVERY 6 HOURS AS NEEDED FOR WHEEZE OR SHORTNESS OF BREATH 18 each 0   ALPRAZolam  (XANAX ) 0.5 MG tablet TAKE 1-2 TABLETS BY MOUTH THREE TIMES A DAY AS NEEDED FOR ANXIETY 180 tablet 5   fluticasone  (FLONASE ) 50 MCG/ACT nasal spray SPRAY 2 SPRAYS INTO EACH NOSTRIL EVERY DAY 48 mL 1   hydrochlorothiazide  (HYDRODIURIL ) 25 MG tablet Take 1 tablet (25 mg total) by mouth daily. 90 tablet 3   HYDROcodone -acetaminophen  (NORCO/VICODIN) 5-325 MG tablet 1-2 tab po bid as needed for pain 90 tablet 0   lamoTRIgine  (LAMICTAL ) 100 MG tablet TAKE 3 TABLETS (300 MG TOTAL) BY  MOUTH AT BEDTIME 270 tablet 0   loratadine  (CLARITIN ) 10 MG tablet Take 10 mg by mouth daily.     losartan  (COZAAR ) 25 MG tablet 1 tab po bid 180 tablet 3   meloxicam  (MOBIC ) 15 MG tablet Take 15 mg by mouth daily.     montelukast  (SINGULAIR ) 10 MG tablet TAKE 1 TABLET BY MOUTH EVERY DAY IN THE EVENING 90 tablet 1   nitroGLYCERIN  (NITROSTAT ) 0.4 MG SL tablet PLACE 1 TABLET (0.4 MG TOTAL) UNDER THE TONGUE EVERY  5 (FIVE) MINUTES X 3 DOSES AS NEEDED FOR CHEST PAIN. 25 tablet 1   ondansetron  (ZOFRAN -ODT) 4 MG disintegrating tablet Take 1 tablet (4 mg total) by mouth every 8 (eight) hours as needed. 8 tablet 0   pantoprazole  (PROTONIX ) 40 MG tablet Take 1 tablet (40 mg total) by mouth daily. PLEASE SCHEDULE F/U VISIT FOR ANY FUTURE REFILLS 90 tablet 0   Tiotropium Bromide -Olodaterol (STIOLTO RESPIMAT ) 2.5-2.5 MCG/ACT AERS INHALE 2 EACH INTO THE LUNGS DAILY. 4 g 5   metoprolol  succinate (TOPROL -XL) 100 MG 24 hr tablet 1 and 1/2 tabs p.o. daily.  Take with or immediately following a meal. 135 tablet 1   atorvastatin  (LIPITOR ) 80 MG tablet TAKE 1 TABLET BY MOUTH EVERY DAY 90 tablet 1   buPROPion  (WELLBUTRIN  XL) 150 MG 24 hr tablet Take 1 tablet (150 mg total) by mouth daily. 30 tablet 1   citalopram  (CELEXA ) 40 MG tablet TAKE 1 TABLET BY MOUTH EVERY DAY 30 tablet 2   famotidine  (PEPCID ) 40 MG tablet TAKE 1 TABLET BY MOUTH EVERY DAY 90 tablet 1   magic mouthwash (nystatin , lidocaine , diphenhydrAMINE , alum & mag hydroxide) suspension Swish and swallow 5 mLs 4 (four) times daily. (Patient not taking: Reported on 02/06/2024) 180 mL 0   potassium chloride  SA (KLOR-CON  M) 20 MEQ tablet TAKE 1 TABLET BY MOUTH EVERY DAY 90 tablet 1   No facility-administered medications prior to visit.    Allergies  Allergen Reactions   Amlodipine  Swelling   Ceftin [Cefuroxime Axetil] Anaphylaxis and Swelling    Swelling of eyes and tongue   Cipro  [Ciprofloxacin  Hcl] Anaphylaxis and Swelling   Risperdal  [Risperidone ]  Palpitations    Generalized weakness Malaise  Hypertension   Fosamax  [Alendronate  Sodium] Diarrhea   Linzess [Linaclotide] Diarrhea   Morphine  And Codeine Other (See Comments)    Hallucinations Disorientation    Movantik  [Naloxegol ]     N/v   Prednisone  Other (See Comments)    Hyperactivity   Roxicodone  [Oxycodone ] Other (See Comments)    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     Review of Systems As per HPI  PE:    02/06/2024   10:36 AM 01/19/2024   10:44 AM 01/05/2024   11:08 AM  Vitals with BMI  Height   5' 8  Weight 182 lbs 181 lbs 10 oz 183 lbs 6 oz  BMI  27.62 27.89  Systolic 125 156 867  Diastolic 73 79 80  Pulse 65 73 81   Pressure check #3 today, manual by me-> 150/90  Physical Exam  General: Alert and well-appearing. Affect is pleasant, thought and speech are lucid. No further exam today  LABS:  Last CBC Lab Results  Component Value Date   WBC 6.6 12/22/2023   HGB 10.6 (L) 12/22/2023   HCT 30.5 (L) 12/22/2023   MCV 87.9 12/22/2023   MCH 30.5 12/22/2023   RDW 12.3 12/22/2023   PLT 265 12/22/2023   Last metabolic panel Lab Results  Component Value Date   GLUCOSE 109 (H) 12/22/2023   NA 134 (L) 12/22/2023   K 4.5 12/22/2023   CL 98 12/22/2023   CO2 22 12/22/2023   BUN 15 12/22/2023   CREATININE 1.00 12/22/2023   GFRNONAA >60 12/22/2023   CALCIUM  9.7 12/22/2023   PROT 6.8 12/22/2023   ALBUMIN  4.6 12/22/2023   BILITOT 0.5 12/22/2023   ALKPHOS 70 12/22/2023   AST 20 12/22/2023   ALT 19 12/22/2023  ANIONGAP 14 12/22/2023   Last lipids Lab Results  Component Value Date   CHOL 176 01/19/2023   HDL 70.60 01/19/2023   LDLCALC 90 01/19/2023   LDLDIRECT 144.1 05/29/2013   TRIG 76.0 01/19/2023   CHOLHDL 2 01/19/2023   Last hemoglobin A1c Lab Results  Component Value Date   HGBA1C 5.5 01/06/2022   Last thyroid  functions Lab Results  Component Value Date   TSH 0.91  10/12/2022   Last vitamin B12 and Folate Lab Results  Component Value Date   VITAMINB12 933 02/10/2023   IMPRESSION AND PLAN:  #1 bipolar affective disorder. A little bit mixed episode right now but overall stable. Continue citalopram  40 mg a day, Wellbutrin  XL 150 mg a day, Lamictal  300 mg a day, and alprazolam  3 times daily as needed.  2.  Labile hypertension. Increase Toprol -XL to 2 of the 100 mg tabs daily.  She will try to verify whether she is taking losartan  or lisinopril ) she does not know which)--> our med list has losartan  25 mg 2 tabs a day. After she calls and clarifies this we will likely increase the dose. Continue HCTZ 25 mg a day.  #3 chronic renal insufficiency stage II/III.   She did have a decline in urine function after having urosepsis last year.  Seems to have recovered now. However, her urine is much more foamy lately. Check urine microalbumin/creatinine today. Serum creatinine was good last check about 6 weeks ago--> 1.0.  An After Visit Summary was printed and given to the patient.  FOLLOW UP: Return in 2 weeks (on 02/20/2024) for f/u HTN. Next CPE February 2026 Signed:  Gerlene Hockey, MD           02/06/2024

## 2024-02-06 NOTE — Progress Notes (Signed)
 Subjective:   Erin Lopez is a 73 y.o. female who presents for a Medicare Annual Wellness Visit.  Allergies (verified) Amlodipine , Ceftin [cefuroxime axetil], Cipro  [ciprofloxacin  hcl], Risperdal  [risperidone ], Fosamax  [alendronate  sodium], Linzess [linaclotide], Morphine  and codeine, Movantik  [employer].darting ], Prednisone , Roxicodone  [oxycodone ], and Seroquel  [quetiapine ]   History: Past Medical History:  Diagnosis Date   Acute encephalopathy 11/19/2017   Alcoholism in remission [facility])    states last alcohol 11 yrs ago   Anemia    Anxiety    Bipolar 1 disorder (HCC)    Admission for mania x 2 (most recent 11/2017)   Cervical spondylosis without myelopathy 12/19/2013   MRI 02/2014: multilevel DDD/spondylosis, not much change compared to prior MRI.  Pt not in favor of invasive therapy for her neck as of 04/2014.   Chronic constipation    Chronic renal insufficiency, stage 3 (moderate)    COPD (chronic obstructive pulmonary disease) (HCC)    Moderate: anoro started 04/2017 by pulm, pt symptomatically improved and PFTs stable at f/u 06/2017.   Coronary artery spasm 07/11/2018   nitrates=HA. Amlodipine =intol swelling.  Changed to coreg  09/03/18 by cardiologist.   Depression    Endometritis 05/2017   possible; empiric tx with Flagyl  by GYN.   Femoral neck fracture (HCC)    right, 01/28/23-->ORIF   Fibromyalgia    GERD (gastroesophageal reflux disease)    Hepatic steatosis 03/2019   u/s abd   Herpes zoster 08/07/2017   L scapular region   Hiatal hernia    History of adenomatous polyp of colon 04/2008; 08/2014   No high grade dysplasia: recall 5 yrs (Dr. Dianna, [facility])   History of basal cell carcinoma excision    NOSE   History of Helicobacter pylori infection 04/2008   +gastric biopsy (gastritis but no metaplasia, dysplasia, or malignancy identified)   History of kidney stones    History of TIA (transient ischemic attack)    secondary to HELLP syndrome 1994 post c/s--   residual memory loss   Hyperlipidemia    statin started after her NSTEMI: goal  LDL <70.   Hypertension    Ischemic cardiomyopathy 05/2018   Echo EF 45-50% in context of NSTEMI from CA spasm. 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 Magdalen 04/2017.   MCI (mild cognitive impairment) with memory loss    Memory loss    Abnl MRI brain and CT brain c/w chronic microvascular ischemia.  Repeat MRI brain 02/2014 stable (Dr. Smitty with no plan to f/u with neuro as of 04/2014)   NSTEMI (non-ST elevated myocardial infarction) (HCC) 05/2018   Echo EF 45-50% in context of NSTEMI from CA spasm.// Myoview  8/21: EF 66, no ischemia, low risk   Osteoarthritis of left wrist    STT and 1st [facility] jt   Osteoporosis 05/2010   2012 'penia; 06/2015 'porosis:  prolia .  DEXA 09/2017 T-score -2.2 femur neck. 11/2021 T score -2.2. Rpt 2 yrs   Pelvic floor dysfunction    [facility] (Dr. Cam)   Postmenopausal vaginal bleeding 05/2017   x 1 small episode; GYN attempted endo bx but unable to penetrate cervix due to severe cerv stenosis (due to postmenopausal state + hx of LEEP).  Endo u/s showed thin endo lining.  Per GYN---no evidence of endomet pathology on w/u---obs  as of 07/2017.   Prediabetes 06/2016   Fasting gluc 105; HbA1c at that time was 6.0%.  A1c 6.2% 12/2017.  A1c 5.7% Feb 2023.   Recurrent kidney stones 08/2013   Left ureteral calculus: Perc nephr--cystoscopy w/ureteroscopy + laser for stone removal.  Residual asymptomatic left renal nephrolithiasis <71mm present post-procedure.  Right sided hydronephrosis-persistent (on u/s)--urol ordered CT to further eval 08/2016: 1.6 cm nonobstructing stone lower pole left kidney, no hydro, plan for PCN extraction ([facility])   Sepsis  (HCC)    UTI   Suprapubic discomfort 06/05/2015   TMJ (temporomandibular joint disorder)    USES  MOUTH GUARD   Toe fracture, left 12/2017   2nd toe prox phalanx (immobilization, Dr. Elner)   Vitamin D deficiency 05/2011   Past Surgical History:  Procedure Laterality Date   ABIs  11/29/2019   NORMAL   ANTERIOR CERVICAL DECOMP/DISCECTOMY FUSION  06/30/2009   C3 -- C5  AND EXPLORATION OF FUSION C5-7 W/  PLATE REMOVAL   ARTHRODESIS METATARSALPHALANGEAL JOINT (MTPJ) Left 07/08/2021   Procedure: Left hallux metatarsophalangeal joint arthrodesis;  Surgeon: Kit Rush, MD;  Location: [facility];  Service: Orthopedics;  Laterality: Left;   BACK SURGERY     BUNIONECTOMY Left    CARDIOVASCULAR STRESS TEST  12/03/2019   myoc perf im: NORMAL   CERVICAL FUSION  2002   anterior C5 -- C7   CERVICAL SPINE SURGERY  08/27/1999     C6 -- T1  LAMINECTOMY/  DISKECTOMY   CESAREAN SECTION  1994   CHOLECYSTECTOMY N/A 03/06/2020   Procedure: LAPAROSCOPIC CHOLECYSTECTOMY;  Surgeon: Lyndel Deward PARAS, MD;  Location: [facility] OR;  Service: General;  Laterality: N/A;   COLONOSCOPY W/ POLYPECTOMY  04/2008;08/2014   2016 tubular adenoma x 1; +diverticulosis and int/ext hemorrhoids.  06/2020 adenoma, recall 5 yrs.   CYSTOSCOPY WITH URETEROSCOPY AND STENT PLACEMENT Left 08/28/2013   Procedure: CYSTOSCOPY, RGP,  WITH URETEROSCOPY AND STENT REMOVAL;  Surgeon: Alm GORMAN Fragmin, MD;  Location: [facility];  Service: Urology;  Laterality: Left;   DEXA  09/2017   T-score -2.2 femur neck: improved compared to 06/2015.  2021 DEXA T score -2.3.  11/2021 DEXA T score -2.2.   ESOPHAGOGASTRODUODENOSCOPY  2010; 08/2014   2016 +Candidal esophagitis; Mild chronic gastritis w/intestinal metaplasia--NEG H pylori, neg for eosinophilic esoph. 06/2020 esoph candidiasis (diflucan  x 20d), o/w normal.   HAMMER TOE SURGERY Left 07/08/2021   Procedure: Second metatarsal head excision; revision Second hammertoe  correction; 2-5 percutaneus flexor tenotomies;  Surgeon: Kit Rush, MD;  Location: [facility];  Service: Orthopedics;  Laterality: Left;   HARDWARE REMOVAL Left 07/08/2021   Procedure: Removal of deep implants from hallux proximal phalanx and First and Second metatarsals;  Surgeon: Kit Rush, MD;  Location: [facility];  Service: Orthopedics;  Laterality: Left;   HIP PINNING,CANNULATED Right 01/28/2023   Procedure: PERCUTANEOUS FIXATION OF RIGHT FEMORAL NECK;  Surgeon: Sharl Selinda Dover, MD;  Location: [facility] OR;  Service: Orthopedics;  Laterality: Right;   HOLMIUM LASER APPLICATION Left 08/28/2013   Procedure: HOLMIUM LASER APPLICATION;  Surgeon: Alm GORMAN Fragmin, MD;  Location: [facility];  Service: Urology;  Laterality: Left;   LEFT HEART CATH AND CORONARY ANGIOGRAPHY N/A 05/15/2018   Evidence for spasm in distal LAD.  Mod RCA atherosclerosis, o/w no CAD.  Procedure: LEFT HEART CATH AND CORONARY ANGIOGRAPHY;  Surgeon: Anner Alm ORN, MD;  Location: [facility] INVASIVE CV LAB;  Service: Cardiovascular;  Laterality:  N/A;   NEPHROLITHOTOMY Left 08/05/2013   Procedure: NEPHROLITHOTOMY PERCUTANEOUS;  Surgeon: Alm GORMAN Fragmin, MD;  Location: [facility];  Service: Urology;  Laterality: Left;   POLYSOMNOGRAM  01/2019   NORMAL 2008 and 2020   POLYSOMNOGRAPHY  10/25/2018   Dr. Wilbert Passey due to pt inadequat sleep time (83 min).   TONSILLECTOMY AND ADENOIDECTOMY  1975   TOTAL KNEE ARTHROPLASTY Right 2003   TOTAL KNEE REVISION Right 01/19/2022   Procedure: Right knee polyethylene revision;  Surgeon: Melodi Lerner, MD;  Location: [facility];  Service: Orthopedics;  Laterality: Right;   TRANSTHORACIC ECHOCARDIOGRAM  05/2018; 12/03/2019   (after MI secondary to arterial spasm): EF 45-50%; severe hypokinesis of apical anterolateral, inferolateral , anterior, and inferior LV myocardium. 11/2019 EF 55-60%, valves fine   ULTRASOUND EXAM,PELVIC COMPLETE ([facility]  HX)  06/25/2015   NORMAL (done by GYN)   Family History  Problem Relation Age of Onset   Alcohol abuse Mother    Arthritis Mother    Hypertension Mother    Hyperlipidemia Mother    Diabetes Mother    Parkinsonism Mother    Breast cancer Mother    Prostate cancer Father    Hypertension Father    Hyperlipidemia Father    Diabetes Father    Heart disease Father    Esophageal cancer Sister    Stomach cancer Sister    Colon polyps Sister    Irritable bowel syndrome Sister    Colon polyps Brother    Heart disease Brother        x 2   Colon cancer Neg Hx    Social History   Occupational History   Occupation: disabliltiy  Tobacco Use   Smoking status: Former    Current packs/day: 0.00    Average packs/day: 1 pack/day for 33.0 years (33.0 ttl pk-yrs)    Types: Cigarettes    Start date: 08/22/1960    Quit date: 08/22/1993    Years since quitting: 30.4   Smokeless tobacco: Never  Vaping Use   Vaping status: Never Used  Substance and Sexual Activity   Alcohol use: No    Alcohol/week: 0.0 standard drinks of alcohol    Comment: ALCOHOLIC IN REMISSION SINCE  2004   Drug use: No   Sexual activity: Not Currently    Birth control/protection: Post-menopausal    Comment: older than 16, less than 5   Tobacco Counseling Counseling given: Not Answered  SDOH Screenings   Food Insecurity: No Food Insecurity (02/06/2024)  Housing: Unknown (02/06/2024)  Transportation Needs: No Transportation Needs (02/06/2024)  Utilities: Not At Risk (02/06/2024)  Alcohol Screen: Low Risk  (03/11/2020)  Depression (PHQ2-9): Medium Risk (01/19/2024)  Financial Resource Strain: Low Risk  (06/24/2021)  Physical Activity: Inactive (02/06/2024)  Social Connections: Socially Isolated (02/06/2024)  Stress: No Stress Concern Present (02/06/2024)  Tobacco Use: Medium Risk (02/06/2024)   Depression Screen    01/19/2024   10:48 AM 05/12/2023    1:02 PM 01/19/2023    1:14 PM 12/15/2022    4:19 PM 08/26/2022     8:51 AM 06/24/2021    1:45 PM 10/29/2020    9:05 AM  PHQ 2/9 Scores  PHQ - 2 Score 4 3 3 5  0 0 0  PHQ- 9 Score 10 11 8 17  0       Goals Addressed   None    Visit info / Clinical Intake: Medicare Wellness Visit Type:: Subsequent Annual Wellness Visit Medicare Wellness Visit Mode:: In-person (required for WTM) Interpreter Needed?:  No Pre-visit prep was completed: no AWV questionnaire completed by patient prior to visit?: no Living arrangements:: (!) lives alone Patient's Overall Health Status Rating: (!) fair Typical amount of pain: some Does pain affect daily life?: (!) yes (makes me tired) Are you currently prescribed opioids?: no  Dietary Habits and Nutritional Risks How many meals a day?: 2 Eats fruit and vegetables daily?: (!) no Most meals are obtained by: preparing own meals Diabetic:: no  Functional Status Activities of Daily Living (to include ambulation/medication): Independent Ambulation: Independent Medication Administration: Independent Home Management: Independent Manage your own finances?: yes Primary transportation is: driving Concerns about vision?: (!) yes Concerns about hearing?: no  Fall Screening Falls in the past year?: 1 Number of falls in past year: 0 Was there an injury with Fall?: 1 Fall Risk Category Calculator: 2 Patient Fall Risk Level: Moderate Fall Risk  Fall Risk Patient at Risk for Falls Due to: History of fall(s) Fall risk Follow up: Falls evaluation completed  Home and Transportation Safety: All rugs have non-skid backing?: yes All stairs or steps have railings?: yes Grab bars in the bathtub or shower?: (!) no Have non-skid surface in bathtub or shower?: yes Good home lighting?: yes Regular seat belt use?: yes  Cognitive Assessment Difficulty concentrating, remembering, or making decisions? : yes Will 6CIT or Mini Cog be Completed: yes What year is it?: 0 points What month is it?: 0 points Give patient an address phrase to  remember (5 components): [place] took the car to the store About what time is it?: 0 points Count backwards from 20 to 1: 0 points Say the months of the year in reverse: 0 points Repeat the address phrase from earlier: 2 points 6 CIT Score: 2 points  Advance Directives (For Healthcare) Does Patient Have a Medical Advance Directive?: Yes Does patient want to make changes to medical advance directive?: No - Patient declined Type of Advance Directive: Healthcare Power of [place]; Living will Copy of Healthcare Power of Attorney in Chart?: No - copy requested Copy of Living Will in Chart?: No - copy requested Would patient like information on creating a medical advance directive?: No - Patient declined  Reviewed/Updated  Reviewed/Updated: All        Objective:    Today's Vitals   02/06/24 1036  BP: 125/73  Pulse: 65  Resp: 12  Temp: 98.8 F (37.1 C)  TempSrc: Oral  SpO2: 96%  Weight: 182 lb (82.6 kg)   Body mass index is 27.67 kg/m.  Current Medications (verified) Outpatient Encounter Medications as of 02/06/2024  Medication Sig   albuterol  (VENTOLIN  HFA) 108 (90 Base) MCG/ACT inhaler TAKE 2 PUFFS BY MOUTH EVERY 6 HOURS AS NEEDED FOR WHEEZE OR SHORTNESS OF BREATH   ALPRAZolam  (XANAX ) 0.5 MG tablet TAKE 1-2 TABLETS BY MOUTH THREE TIMES A DAY AS NEEDED FOR ANXIETY   fluticasone  (FLONASE ) 50 MCG/ACT nasal spray SPRAY 2 SPRAYS INTO EACH NOSTRIL EVERY DAY   hydrochlorothiazide  (HYDRODIURIL ) 25 MG tablet Take 1 tablet (25 mg total) by mouth daily.   lamoTRIgine  (LAMICTAL ) 100 MG tablet TAKE 3 TABLETS (300 MG TOTAL) BY MOUTH AT BEDTIME   loratadine  (CLARITIN ) 10 MG tablet Take 10 mg by mouth daily.   losartan  (COZAAR ) 25 MG tablet 1 tab po bid   meloxicam  (MOBIC ) 15 MG tablet Take 15 mg by mouth daily.   montelukast  (SINGULAIR ) 10 MG tablet TAKE 1 TABLET BY MOUTH EVERY DAY IN THE EVENING   nitroGLYCERIN  (NITROSTAT ) 0.4 MG SL tablet PLACE  1 TABLET (0.4 MG TOTAL) UNDER THE TONGUE  EVERY 5 (FIVE) MINUTES X 3 DOSES AS NEEDED FOR CHEST PAIN.   ondansetron  (ZOFRAN -ODT) 4 MG disintegrating tablet Take 1 tablet (4 mg total) by mouth every 8 (eight) hours as needed.   pantoprazole  (PROTONIX ) 40 MG tablet Take 1 tablet (40 mg total) by mouth daily. PLEASE SCHEDULE F/U VISIT FOR ANY FUTURE REFILLS   Tiotropium Bromide -Olodaterol (STIOLTO RESPIMAT ) 2.5-2.5 MCG/ACT AERS INHALE 2 EACH INTO THE LUNGS DAILY.   [DISCONTINUED] metoprolol  succinate (TOPROL -XL) 100 MG 24 hr tablet 1 and 1/2 tabs p.o. daily.  Take with or immediately following a meal.   atorvastatin  (LIPITOR ) 80 MG tablet Take 1 tablet (80 mg total) by mouth daily.   buPROPion  (WELLBUTRIN  XL) 150 MG 24 hr tablet Take 1 tablet (150 mg total) by mouth daily.   citalopram  (CELEXA ) 40 MG tablet Take 1 tablet (40 mg total) by mouth daily.   famotidine  (PEPCID ) 40 MG tablet Take 1 tablet (40 mg total) by mouth daily.   HYDROcodone -acetaminophen  (NORCO/VICODIN) 5-325 MG tablet 1-2 tab po bid as needed for pain (Patient not taking: Reported on 02/06/2024)   metoprolol  succinate (TOPROL -XL) 100 MG 24 hr tablet 2 tabs p.o. daily.  Take with or immediately following a meal.   potassium chloride  SA (KLOR-CON  M) 20 MEQ tablet Take 1 tablet (20 mEq total) by mouth daily.   [DISCONTINUED] atorvastatin  (LIPITOR ) 80 MG tablet TAKE 1 TABLET BY MOUTH EVERY DAY   [DISCONTINUED] buPROPion  (WELLBUTRIN  XL) 150 MG 24 hr tablet Take 1 tablet (150 mg total) by mouth daily.   [DISCONTINUED] citalopram  (CELEXA ) 40 MG tablet TAKE 1 TABLET BY MOUTH EVERY DAY   [DISCONTINUED] famotidine  (PEPCID ) 40 MG tablet TAKE 1 TABLET BY MOUTH EVERY DAY   [DISCONTINUED] magic mouthwash (nystatin , lidocaine , diphenhydrAMINE , alum & mag hydroxide) suspension Swish and swallow 5 mLs 4 (four) times daily. (Patient not taking: Reported on 02/06/2024)   [DISCONTINUED] potassium chloride  SA (KLOR-CON  M) 20 MEQ tablet TAKE 1 TABLET BY MOUTH EVERY DAY   No facility-administered  encounter medications on file as of 02/06/2024.   Hearing/Vision screen No results found. Immunizations and Health Maintenance Health Maintenance  Topic Date Due   DEXA SCAN  11/05/2023   COVID-19 Vaccine (3 - Pfizer risk series) 02/22/2024 (Originally 08/09/2019)   Mammogram  05/29/2024   Medicare Annual Wellness (AWV)  02/05/2025   Colonoscopy  07/02/2027   DTaP/Tdap/Td (4 - Td or Tdap) 10/16/2031   Pneumococcal Vaccine: 50+ Years  Completed   Influenza Vaccine  Completed   Hepatitis C Screening  Completed   Zoster Vaccines- Shingrix  Completed   Meningococcal B Vaccine  Aged Out        Assessment/Plan:  This is a routine wellness examination for Erin Lopez.  Patient Care Team: Candise Aleene DEL, MD as PCP - General (Family Medicine) Shlomo Wilbert SAUNDERS, MD as PCP - Cardiology (Cardiology) Jenel Carlin POUR, MD (Inactive) as Consulting Physician (Neurology) Unice Pac, MD as Consulting Physician (Neurosurgery) Shona Rush, MD as Consulting Physician (Dermatology) Mardee Viktoria CROME, MD as Consulting Physician (Rheumatology) Debby Macintosh, DMD (Dentistry) Regal, Pasco RAMAN, DPM as Consulting Physician (Podiatry) Nicholaus Tanda CROME DOUGLAS, MD as Consulting Physician (Urology) Mannam, Praveen, MD as Consulting Physician (Pulmonary Disease) Ortho, [facility] (Specialist) Pyrtle, Gordy HERO, MD as Consulting Physician (Gastroenterology) [facility] Idol, MD as Consulting Physician (Ophthalmology) Stechschulte, Deward PARAS, MD as Consulting Physician (Surgery) Pyrtle, Gordy HERO, MD as Consulting Physician (Gastroenterology) Darden, Evalene HERO, DO as Consulting Physician (Nephrology)  I have personally reviewed and noted the following in the patient's chart:   Medical and social history Use of alcohol, tobacco or illicit drugs  Current medications and supplements including opioid prescriptions. Functional ability and status Nutritional status Physical activity Advanced directives List of other  physicians Hospitalizations, surgeries, and ER visits in previous 12 months Vitals Screenings to include cognitive, depression, and falls Referrals and appointments  Orders Placed This Encounter  Procedures   Microalbumin / creatinine urine ratio   In addition, I have reviewed and discussed with patient certain preventive protocols, quality metrics, and best practice recommendations. A written personalized care plan for preventive services as well as general preventive health recommendations were provided to patient.   Shanda LELON Sharps, CMA   02/06/2024   Return in 2 weeks (on 02/20/2024) for f/u HTN.  After Visit Summary: (In Person-Printed) AVS printed and given to the patient  Nurse Notes: Non-Face to Face or Face to Face 10 minute visit Encounter

## 2024-02-06 NOTE — Telephone Encounter (Signed)
 Copied from CRM #[id number]. Topic: Clinical - Medical Advice >> Feb 06, 2024  1:53 PM Nessti S wrote: Reason for CRM: pt states that she is taking losartan  (COZAAR ) 25 MG tablet 2 a day

## 2024-02-07 LAB — MICROALBUMIN / CREATININE URINE RATIO
Creatinine, Urine: 95 mg/dL (ref 20–275)
Microalb Creat Ratio: 4 mg/g{creat} (ref <30)
Microalb, Ur: 0.4 mg/dL

## 2024-02-07 NOTE — Telephone Encounter (Signed)
 Okay, please tell patient I would like her to take 3 of the losartan  25 mg tabs every day now.

## 2024-02-07 NOTE — Telephone Encounter (Signed)
 LVM for pt to return call regarding medication update. MyChart message sent as well

## 2024-02-08 ENCOUNTER — Ambulatory Visit: Payer: Self-pay | Admitting: Family Medicine

## 2024-02-08 NOTE — Telephone Encounter (Signed)
 No further action needed at this time.

## 2024-02-13 DIAGNOSIS — H524 Presbyopia: Secondary | ICD-10-CM | POA: Diagnosis not present

## 2024-02-19 ENCOUNTER — Ambulatory Visit: Payer: Self-pay | Admitting: Primary Care

## 2024-02-19 ENCOUNTER — Telehealth: Payer: Self-pay

## 2024-02-19 DIAGNOSIS — R21 Rash and other nonspecific skin eruption: Secondary | ICD-10-CM

## 2024-02-19 NOTE — Telephone Encounter (Signed)
 Did the patient discuss referral with their provider in the last year? Yes    (If No - schedule appointment)    (If Yes - send message)        Appointment offered? No        Type of order/referral and detailed reason for visit: horrible red/rash sore on the side of her head        Preference of office, provider, location: Dermatology        If referral order, have you been seen by this specialty before? No    (If Yes, this issue or another issue? When? Where?        Can we respond through MyChart? Yes

## 2024-02-19 NOTE — Telephone Encounter (Signed)
 E2C2 Pulmonary Triage - Initial Assessment Questions "Chief Complaint (e.g., cough, sob, wheezing, fever, chills, sweat or additional symptoms) *Go to specific symptom protocol after initial questions. Patient reports shortness of breath since last week. Patient states shortness of breath is moderate in nature-patient is unsure if some anxiety could be related to this. No wheezing, chills or sweating. Does have a chronic cough.  Patient reports that she has been using her Stiolto inhaler more than one a day. Patient states she tried to refill the prescription and pharmacy told her to call the office. Initial prescription was sent on 01/23/2024 and has five refills. Patient was recommended to call the pharmacy and get detailed information of why the inhaler can't be refilled. Discussed with patient if she is needing an acute visit. Patient states she is going to call the pharmacy and get more details. Patient is needing assistance from the office. Would like a call back from the office.   "How long have symptoms been present?" Started last week.   Have you tested for COVID or Flu? Note: If not, ask patient if a home test can be taken. If so, instruct patient to call back for positive results. No  MEDICINES:   "Have you used any OTC meds to help with symptoms?" No If yes, ask "What medications?"   "Have you used your inhalers/maintenance medication?" Yes If yes, "What medications?" Albuterol  inhaler Stiolto inhaler  If inhaler, ask "How many puffs and how often?" Note: Review instructions on medication in the chart. Albuterol  2 puffs q6H Stiolto 2 puffs Daily  OXYGEN : "Do you wear supplemental oxygen ?" No If yes, "How many liters are you supposed to use?"  "Do you monitor your oxygen  levels?" Yes If yes, What is your reading (oxygen  level) today? 94%  What is your usual oxygen  saturation reading?  (Note: Pulmonary O2 sats should be 90% or greater) 94%   Copied from CRM #[id number].  Topic: Clinical - Red Word Triage >> Feb 19, 2024 12:15 PM Erin Lopez wrote: Red Word that prompted transfer to Nurse Triage: Pt has been without her Tiotropium Bromide -Olodaterol (STIOLTO RESPIMAT ) 2.5-2.5 MCG/ACT AERS since Thursday of last week causing her SOB to flair up and worsen.   Pt needs a couple things as mentioned before the transfer: 1) Refill of her Tiotropium Bromide -Olodaterol (STIOLTO RESPIMAT ) 2.5-2.5 MCG/ACT AERS and 2) Transfer of Care appt with a new pulm provider at the [facility] clinic.   Pt currently sees NP Erin Lopez in [place]. >> Feb 19, 2024 12:42 PM Erin Lopez wrote: Patient is calling back to speak with nurse due to having trouble breathing & not having inhaler. >> Feb 19, 2024 12:22 PM Erin Lopez wrote: Pt stated she could not longer hold as she is needing to call her dentist. Pt's phone number is [phone]. Reason for Disposition  [1] Longstanding difficulty breathing (e.g., CHF, COPD, emphysema) AND [2] WORSE than normal  Answer Assessment - Initial Assessment Questions 6. CARDIAC HISTORY: Do you have any history of heart disease? (e.g., heart attack, angina, bypass surgery, angioplasty)      yes 7. LUNG HISTORY: Do you have any history of lung disease?  (e.g., pulmonary embolus, asthma, emphysema)     yes 12. TRAVEL: Have you traveled out of the country in the last month? (e.g., travel history, exposures)       no  Protocols used: Breathing Difficulty-A-AH

## 2024-02-19 NOTE — Telephone Encounter (Signed)
Dr. Mannam, please advise. 

## 2024-02-19 NOTE — Telephone Encounter (Signed)
 Please call the patient and get more information as the triage note is confusing.  Is  she out of Stiolto and needs new prescription?  If so please send in the prescription.  Also I am not sure why there is a transfer of care to Dr. Clarene at [facility]

## 2024-02-20 NOTE — Telephone Encounter (Signed)
 Looks like she got the prescription that was needed.  Okay for transfer of care to [facility].  Thank you

## 2024-02-20 NOTE — Telephone Encounter (Signed)
 I called and spoke with patient to get clarification of what was needed, she states she received the refill of stiolto that she needed.  She stated she requested to be seen at [facility] because it is closer for her and she does not drive much.  Please advise if this is ok with you.  I apologize for the confusion.  Thank you.

## 2024-02-20 NOTE — Telephone Encounter (Signed)
 Please Advise

## 2024-02-20 NOTE — Telephone Encounter (Signed)
 Okay to order dermatology referral but is going to take a long time to get in and if she has a horrible rash on the side of her head she should get seen here this week.

## 2024-02-20 NOTE — Telephone Encounter (Signed)
 LVM for pt to return call regarding referral. Pt should schedule appt with PCP this week

## 2024-02-21 NOTE — Telephone Encounter (Signed)
 Tried calling patient, unable to LVM

## 2024-02-22 NOTE — Progress Notes (Deleted)
 Established Patient Pulmonology Office Visit   Subjective:  Patient ID: Erin Lopez, female    DOB: 1950/12/02  MRN: [id number]  CC: No chief complaint on file.   HPI  Erin Lopez is a 73 y/o F with hx of COPD (FEV1 1.56 L, 56% pred) w  {PULM QUESTIONNAIRES (Optional):33196}  ROS  {History (Optional):23778}  Current Outpatient Medications:    albuterol  (VENTOLIN  HFA) 108 (90 Base) MCG/ACT inhaler, TAKE 2 PUFFS BY MOUTH EVERY 6 HOURS AS NEEDED FOR WHEEZE OR SHORTNESS OF BREATH, Disp: 18 each, Rfl: 0   ALPRAZolam  (XANAX ) 0.5 MG tablet, TAKE 1-2 TABLETS BY MOUTH THREE TIMES A DAY AS NEEDED FOR ANXIETY, Disp: 180 tablet, Rfl: 5   atorvastatin  (LIPITOR ) 80 MG tablet, Take 1 tablet (80 mg total) by mouth daily., Disp: 90 tablet, Rfl: 3   buPROPion  (WELLBUTRIN  XL) 150 MG 24 hr tablet, Take 1 tablet (150 mg total) by mouth daily., Disp: 90 tablet, Rfl: 3   citalopram  (CELEXA ) 40 MG tablet, Take 1 tablet (40 mg total) by mouth daily., Disp: 90 tablet, Rfl: 3   famotidine  (PEPCID ) 40 MG tablet, Take 1 tablet (40 mg total) by mouth daily., Disp: 90 tablet, Rfl: 3   fluticasone  (FLONASE ) 50 MCG/ACT nasal spray, SPRAY 2 SPRAYS INTO EACH NOSTRIL EVERY DAY, Disp: 48 mL, Rfl: 1   hydrochlorothiazide  (HYDRODIURIL ) 25 MG tablet, Take 1 tablet (25 mg total) by mouth daily., Disp: 90 tablet, Rfl: 3   HYDROcodone -acetaminophen  (NORCO/VICODIN) 5-325 MG tablet, 1-2 tab po bid as needed for pain (Patient not taking: Reported on 02/06/2024), Disp: 90 tablet, Rfl: 0   lamoTRIgine  (LAMICTAL ) 100 MG tablet, TAKE 3 TABLETS (300 MG TOTAL) BY MOUTH AT BEDTIME, Disp: 270 tablet, Rfl: 0   loratadine  (CLARITIN ) 10 MG tablet, Take 10 mg by mouth daily., Disp: , Rfl:    losartan  (COZAAR ) 25 MG tablet, 1 tab po bid, Disp: 180 tablet, Rfl: 3   meloxicam  (MOBIC ) 15 MG tablet, Take 15 mg by mouth daily., Disp: , Rfl:    metoprolol  succinate (TOPROL -XL) 100 MG 24 hr tablet, 2 tabs p.o. daily.  Take with or immediately  following a meal., Disp: , Rfl:    montelukast  (SINGULAIR ) 10 MG tablet, TAKE 1 TABLET BY MOUTH EVERY DAY IN THE EVENING, Disp: 90 tablet, Rfl: 1   nitroGLYCERIN  (NITROSTAT ) 0.4 MG SL tablet, PLACE 1 TABLET (0.4 MG TOTAL) UNDER THE TONGUE EVERY 5 (FIVE) MINUTES X 3 DOSES AS NEEDED FOR CHEST PAIN., Disp: 25 tablet, Rfl: 1   ondansetron  (ZOFRAN -ODT) 4 MG disintegrating tablet, Take 1 tablet (4 mg total) by mouth every 8 (eight) hours as needed., Disp: 8 tablet, Rfl: 0   pantoprazole  (PROTONIX ) 40 MG tablet, Take 1 tablet (40 mg total) by mouth daily. PLEASE SCHEDULE F/U VISIT FOR ANY FUTURE REFILLS, Disp: 90 tablet, Rfl: 0   potassium chloride  SA (KLOR-CON  M) 20 MEQ tablet, Take 1 tablet (20 mEq total) by mouth daily., Disp: 90 tablet, Rfl: 3   Tiotropium Bromide -Olodaterol (STIOLTO RESPIMAT ) 2.5-2.5 MCG/ACT AERS, INHALE 2 EACH INTO THE LUNGS DAILY., Disp: 4 g, Rfl: 5      Objective:  There were no vitals taken for this visit. {Pulm Vitals (Optional):32837}  Physical Exam   Diagnostic Review:  {Labs (Optional):32838}  CTA 11/2021: IMPRESSION: 1. No CT evidence of pulmonary embolism. 2. Small right pleural effusion with bibasilar linear atelectasis/scarring. 3. Aortic Atherosclerosis (ICD10-I70.0) and Emphysema (ICD10-J43.9)  PFT: moderately severe obstructive defect    Assessment & Plan:  Assessment & Plan   No orders of the defined types were placed in this encounter.     No follow-ups on file.   Oddie Kuhlmann, MD

## 2024-02-23 ENCOUNTER — Other Ambulatory Visit: Payer: Self-pay | Admitting: Family Medicine

## 2024-02-23 ENCOUNTER — Encounter (HOSPITAL_BASED_OUTPATIENT_CLINIC_OR_DEPARTMENT_OTHER): Admitting: Pulmonary Disease

## 2024-02-27 ENCOUNTER — Encounter: Payer: Self-pay | Admitting: Family Medicine

## 2024-02-27 ENCOUNTER — Ambulatory Visit (INDEPENDENT_AMBULATORY_CARE_PROVIDER_SITE_OTHER): Admitting: Family Medicine

## 2024-02-27 VITALS — BP 146/80 | HR 71 | Temp 97.5°F | Ht 68.0 in | Wt 185.4 lb

## 2024-02-27 DIAGNOSIS — R052 Subacute cough: Secondary | ICD-10-CM

## 2024-02-27 DIAGNOSIS — J449 Chronic obstructive pulmonary disease, unspecified: Secondary | ICD-10-CM

## 2024-02-27 DIAGNOSIS — R6889 Other general symptoms and signs: Secondary | ICD-10-CM | POA: Diagnosis not present

## 2024-02-27 DIAGNOSIS — I10 Essential (primary) hypertension: Secondary | ICD-10-CM

## 2024-02-27 MED ORDER — METOPROLOL SUCCINATE ER 100 MG PO TB24: ORAL_TABLET | ORAL | 3 refills | Status: AC

## 2024-02-27 MED ORDER — LOSARTAN POTASSIUM 100 MG PO TABS: 100.0000 mg | ORAL_TABLET | Freq: Every day | ORAL | 3 refills | Status: AC

## 2024-02-27 NOTE — Patient Instructions (Signed)
 Check your blood pressure and heart rate 3 times per WEEK and write the numbers down to review with me in 3 wks.

## 2024-02-27 NOTE — Progress Notes (Signed)
 OFFICE VISIT  02/27/2024  CC:  Chief Complaint  Patient presents with   Medical Management of Chronic Issues    Patient is a 73 y.o. female who presents for 3-week follow-up uncontrolled labile hypertension. A/P as of last visit:  Labile hypertension. Increase Toprol -XL to 2 of the 100 mg tabs daily.  She will try to verify whether she is taking losartan  or lisinopril ) she does not know which)--> our med list has losartan  25 mg 2 tabs a day. After she calls and clarifies this we will likely increase the dose. Continue HCTZ 25 mg a day.  INTERIM HX: She called back after last visit and confirmed that she is taking 2 of the losartan  25 mg tabs daily.  I gave a message to increase dose to 3 of the 25 mg tabs but she never got the message. She has noted gradually increased nonproductive cough, wheezing and dyspnea on exertion. She does not know if it has come on as we have increased her Toprol  or not.  No chest pain, palpitations, orthopnea, PND, or lower extremity swelling.  No nasal congestion, postnasal drip, sinus pressure, or sore throat.   Past Medical History:  Diagnosis Date   Acute encephalopathy 11/19/2017   Alcoholism in remission [facility])    states last alcohol 11 yrs ago   Anemia    Anxiety    Bipolar 1 disorder (HCC)    Admission for mania x 2 (most recent 11/2017)   Cervical spondylosis without myelopathy 12/19/2013   MRI 02/2014: multilevel DDD/spondylosis, not much change compared to prior MRI.  Pt not in favor of invasive therapy for her neck as of 04/2014.   Chronic constipation    Chronic renal insufficiency, stage 3 (moderate)    COPD (chronic obstructive pulmonary disease) (HCC)    Moderate: anoro started 04/2017 by pulm, pt symptomatically improved and PFTs stable at f/u 06/2017.   Coronary artery spasm 07/11/2018   nitrates=HA. Amlodipine =intol swelling.  Changed to coreg  09/03/18 by cardiologist.   Depression    Endometritis 05/2017   possible; empiric tx  with Flagyl  by GYN.   Femoral neck fracture (HCC)    right, 01/28/23-->ORIF   Fibromyalgia    GERD (gastroesophageal reflux disease)    Hepatic steatosis 03/2019   u/s abd   Herpes zoster 08/07/2017   L scapular region   Hiatal hernia    History of adenomatous polyp of colon 04/2008; 08/2014   No high grade dysplasia: recall 5 yrs (Dr. Dianna, [facility])   History of basal cell carcinoma excision    NOSE   History of Helicobacter pylori infection 04/2008   +gastric biopsy (gastritis but no metaplasia, dysplasia, or malignancy identified)   History of kidney stones    History of TIA (transient ischemic attack)    secondary to HELLP syndrome 1994 post c/s--  residual memory loss   Hyperlipidemia    statin started after her NSTEMI: goal  LDL <70.   Hypertension    Ischemic cardiomyopathy 05/2018   Echo EF 45-50% in context of NSTEMI from CA spasm. 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 Magdalen 04/2017.   MCI (mild cognitive impairment) with memory loss    Memory loss    Abnl MRI brain and CT brain c/w chronic microvascular ischemia.  Repeat MRI brain 02/2014 stable (Dr. Smitty with no plan to f/u with neuro as of 04/2014)   NSTEMI (non-ST elevated myocardial infarction) (HCC) 05/2018   Echo EF 45-50% in context of NSTEMI from CA spasm.// Myoview  8/21: EF 66, no ischemia, low risk   Osteoarthritis of left wrist    STT and 1st [facility] jt   Osteoporosis 05/2010   2012 'penia; 06/2015 'porosis:  prolia .  DEXA 09/2017 T-score -2.2 femur neck. 11/2021 T score -2.2. Rpt 2 yrs   Pelvic floor dysfunction    [facility] (Dr. Cam)   Postmenopausal vaginal bleeding 05/2017   x 1 small episode; GYN attempted endo bx but unable to penetrate cervix due to  severe cerv stenosis (due to postmenopausal state + hx of LEEP).  Endo u/s showed thin endo lining.  Per GYN---no evidence of endomet pathology on w/u---obs as of 07/2017.   Prediabetes 06/2016   Fasting gluc 105; HbA1c at that time was 6.0%.  A1c 6.2% 12/2017.  A1c 5.7% Feb 2023.   Recurrent kidney stones 08/2013   Left ureteral calculus: Perc nephr--cystoscopy w/ureteroscopy + laser for stone removal.  Residual asymptomatic left renal nephrolithiasis <22mm present post-procedure.  Right sided hydronephrosis-persistent (on u/s)--urol ordered CT to further eval 08/2016: 1.6 cm nonobstructing stone lower pole left kidney, no hydro, plan for PCN extraction ([facility])   Sepsis (HCC)    UTI   Suprapubic discomfort 06/05/2015   TMJ (temporomandibular joint disorder)    USES  MOUTH GUARD   Toe fracture, left 12/2017   2nd toe prox phalanx (immobilization, Dr. Elner)   Vitamin D deficiency 05/2011    Past Surgical History:  Procedure Laterality Date   ABIs  11/29/2019   NORMAL   ANTERIOR CERVICAL DECOMP/DISCECTOMY FUSION  06/30/2009   C3 -- C5  AND EXPLORATION OF FUSION C5-7 W/  PLATE REMOVAL   ARTHRODESIS METATARSALPHALANGEAL JOINT (MTPJ) Left 07/08/2021   Procedure: Left hallux metatarsophalangeal joint arthrodesis;  Surgeon: Kit Rush, MD;  Location: [facility];  Service: Orthopedics;  Laterality: Left;   BACK SURGERY     BUNIONECTOMY Left    CARDIOVASCULAR STRESS TEST  12/03/2019   myoc perf im: NORMAL   CERVICAL FUSION  2002   anterior C5 -- C7   CERVICAL SPINE SURGERY  08/27/1999     C6 -- T1  LAMINECTOMY/  DISKECTOMY   CESAREAN SECTION  1994   CHOLECYSTECTOMY N/A 03/06/2020   Procedure: LAPAROSCOPIC CHOLECYSTECTOMY;  Surgeon: Lyndel Deward PARAS, MD;  Location: [facility];  Service: General;  Laterality: N/A;   COLONOSCOPY W/ POLYPECTOMY  04/2008;08/2014   2016 tubular adenoma x 1; +diverticulosis and int/ext hemorrhoids.  06/2020 adenoma, recall 5 yrs.   CYSTOSCOPY WITH  URETEROSCOPY AND STENT PLACEMENT Left 08/28/2013   Procedure: CYSTOSCOPY, RGP,  WITH URETEROSCOPY AND STENT REMOVAL;  Surgeon: Alm GORMAN Fragmin, MD;  Location: [facility];  Service: Urology;  Laterality: Left;   DEXA  09/2017   T-score -2.2 femur neck: improved compared to 06/2015.  2021 DEXA T score -2.3.  11/2021 DEXA T score -2.2.   ESOPHAGOGASTRODUODENOSCOPY  2010; 08/2014   2016 +Candidal esophagitis; Mild chronic gastritis w/intestinal metaplasia--NEG H pylori, neg for eosinophilic esoph. 06/2020 esoph candidiasis (diflucan  x 20d), o/w normal.   HAMMER TOE SURGERY Left 07/08/2021   Procedure: Second metatarsal head excision; revision Second hammertoe correction; 2-5 percutaneus flexor tenotomies;  Surgeon: Kit Rush, MD;  Location: [facility];  Service: Orthopedics;  Laterality: Left;  HARDWARE REMOVAL Left 07/08/2021   Procedure: Removal of deep implants from hallux proximal phalanx and First and Second metatarsals;  Surgeon: Kit Rush, MD;  Location: [facility];  Service: Orthopedics;  Laterality: Left;   HIP PINNING,CANNULATED Right 01/28/2023   Procedure: PERCUTANEOUS FIXATION OF RIGHT FEMORAL NECK;  Surgeon: Sharl Selinda Dover, MD;  Location: [facility] OR;  Service: Orthopedics;  Laterality: Right;   HOLMIUM LASER APPLICATION Left 08/28/2013   Procedure: HOLMIUM LASER APPLICATION;  Surgeon: Alm GORMAN Fragmin, MD;  Location: [facility];  Service: Urology;  Laterality: Left;   LEFT HEART CATH AND CORONARY ANGIOGRAPHY N/A 05/15/2018   Evidence for spasm in distal LAD.  Mod RCA atherosclerosis, o/w no CAD.  Procedure: LEFT HEART CATH AND CORONARY ANGIOGRAPHY;  Surgeon: Anner Alm ORN, MD;  Location: [facility];  Service: Cardiovascular;  Laterality: N/A;   NEPHROLITHOTOMY Left 08/05/2013   Procedure: NEPHROLITHOTOMY PERCUTANEOUS;  Surgeon: Alm GORMAN Fragmin, MD;  Location: [facility];  Service: Urology;  Laterality: Left;    POLYSOMNOGRAM  01/2019   NORMAL 2008 and 2020   POLYSOMNOGRAPHY  10/25/2018   Dr. Wilbert Passey due to pt inadequat sleep time (83 min).   TONSILLECTOMY AND ADENOIDECTOMY  1975   TOTAL KNEE ARTHROPLASTY Right 2003   TOTAL KNEE REVISION Right 01/19/2022   Procedure: Right knee polyethylene revision;  Surgeon: Melodi Lerner, MD;  Location: [facility];  Service: Orthopedics;  Laterality: Right;   TRANSTHORACIC ECHOCARDIOGRAM  05/2018; 12/03/2019   (after MI secondary to arterial spasm): EF 45-50%; severe hypokinesis of apical anterolateral, inferolateral , anterior, and inferior LV myocardium. 11/2019 EF 55-60%, valves fine   ULTRASOUND EXAM,PELVIC COMPLETE ([facility] HX)  06/25/2015   NORMAL (done by GYN)    Outpatient Medications Prior to Visit  Medication Sig Dispense Refill   albuterol  (VENTOLIN  HFA) 108 (90 Base) MCG/ACT inhaler TAKE 2 PUFFS BY MOUTH EVERY 6 HOURS AS NEEDED FOR WHEEZE OR SHORTNESS OF BREATH 18 each 0   ALPRAZolam  (XANAX ) 0.5 MG tablet TAKE 1-2 TABLETS BY MOUTH THREE TIMES A DAY AS NEEDED FOR ANXIETY 180 tablet 5   atorvastatin  (LIPITOR ) 80 MG tablet Take 1 tablet (80 mg total) by mouth daily. 90 tablet 3   buPROPion  (WELLBUTRIN  XL) 150 MG 24 hr tablet Take 1 tablet (150 mg total) by mouth daily. 90 tablet 3   citalopram  (CELEXA ) 40 MG tablet Take 1 tablet (40 mg total) by mouth daily. 90 tablet 3   famotidine  (PEPCID ) 40 MG tablet Take 1 tablet (40 mg total) by mouth daily. 90 tablet 3   fluticasone  (FLONASE ) 50 MCG/ACT nasal spray SPRAY 2 SPRAYS INTO EACH NOSTRIL EVERY DAY 48 mL 1   hydrochlorothiazide  (HYDRODIURIL ) 25 MG tablet Take 1 tablet (25 mg total) by mouth daily. 90 tablet 3   HYDROcodone -acetaminophen  (NORCO/VICODIN) 5-325 MG tablet 1-2 tab po bid as needed for pain 90 tablet 0   lamoTRIgine  (LAMICTAL ) 100 MG tablet TAKE 3 TABLETS (300 MG TOTAL) BY MOUTH AT BEDTIME 270 tablet 0   loratadine  (CLARITIN ) 10 MG tablet Take 10 mg by mouth daily.     losartan   (COZAAR ) 25 MG tablet 1 tab po bid 180 tablet 3   meloxicam  (MOBIC ) 15 MG tablet Take 15 mg by mouth daily.     metoprolol  succinate (TOPROL -XL) 100 MG 24 hr tablet TAKE 2 TABS BY MOUTH DAILY. TAKE WITH OR IMMEDIATELY FOLLOWING A MEAL. 60 tablet 0   montelukast  (SINGULAIR ) 10 MG tablet TAKE 1 TABLET BY  MOUTH EVERY DAY IN THE EVENING 90 tablet 1   nitroGLYCERIN  (NITROSTAT ) 0.4 MG SL tablet PLACE 1 TABLET (0.4 MG TOTAL) UNDER THE TONGUE EVERY 5 (FIVE) MINUTES X 3 DOSES AS NEEDED FOR CHEST PAIN. 25 tablet 1   ondansetron  (ZOFRAN -ODT) 4 MG disintegrating tablet Take 1 tablet (4 mg total) by mouth every 8 (eight) hours as needed. 8 tablet 0   pantoprazole  (PROTONIX ) 40 MG tablet Take 1 tablet (40 mg total) by mouth daily. PLEASE SCHEDULE F/U VISIT FOR ANY FUTURE REFILLS 90 tablet 0   potassium chloride  SA (KLOR-CON  M) 20 MEQ tablet Take 1 tablet (20 mEq total) by mouth daily. 90 tablet 3   Tiotropium Bromide -Olodaterol (STIOLTO RESPIMAT ) 2.5-2.5 MCG/ACT AERS INHALE 2 EACH INTO THE LUNGS DAILY. 4 g 5   No facility-administered medications prior to visit.    Allergies  Allergen Reactions   Amlodipine  Swelling   Ceftin [Cefuroxime Axetil] Anaphylaxis and Swelling    Swelling of eyes and tongue   Cipro  [Ciprofloxacin  Hcl] Anaphylaxis and Swelling   Risperdal  [Risperidone ] Palpitations    Generalized weakness Malaise  Hypertension   Fosamax  [Alendronate  Sodium] Diarrhea   Linzess [Linaclotide] Diarrhea   Morphine  And Codeine Other (See Comments)    Hallucinations Disorientation    Movantik  [Naloxegol ]     N/v   Prednisone  Other (See Comments)    Hyperactivity   Roxicodone  [Oxycodone ] Other (See Comments)    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     Review of Systems As per HPI  PE:    02/27/2024   10:47 AM 02/27/2024   10:41 AM 02/06/2024   10:36 AM  Vitals with BMI  Height  5' 8   Weight  185 lbs 6 oz 182 lbs  BMI  28.2    Systolic 146 147 874  Diastolic 80 63 73  Pulse  71 65     Physical Exam  General: Alert and well-appearing. Cardiovascular: Regular rhythm and rate without murmur. Lung: Right basilar inspiratory crackles.  Otherwise clear on inspiration and expiration, good airflow.  Breathing is nonlabored. Extremities: No cyanosis or edema.  LABS:  Last metabolic panel Lab Results  Component Value Date   GLUCOSE 109 (H) 12/22/2023   NA 134 (L) 12/22/2023   K 4.5 12/22/2023   CL 98 12/22/2023   CO2 22 12/22/2023   BUN 15 12/22/2023   CREATININE 1.00 12/22/2023   GFRNONAA >60 12/22/2023   CALCIUM  9.7 12/22/2023   PROT 6.8 12/22/2023   ALBUMIN  4.6 12/22/2023   BILITOT 0.5 12/22/2023   ALKPHOS 70 12/22/2023   AST 20 12/22/2023   ALT 19 12/22/2023   ANIONGAP 14 12/22/2023   IMPRESSION AND PLAN:  #1 uncontrolled hypertension. Increase losartan  to 100 mg a day.  Continue HCTZ 25 mg a day and Toprol -XL 200 mg a day.  #2 COPD, relatively poor control. She does not tolerate prednisone --> significant increased anxiety, insomnia, mood lability. Question inspiratory crackles right base today. Check chest x-ray.  Continue Stiolto 2.5-2.5, 2 puffs daily as well as albuterol  every 6 hours as needed.  An After Visit Summary was printed and given to the patient.  FOLLOW UP: No follow-ups on file.  Signed:  Gerlene Hockey, MD           02/27/2024

## 2024-02-28 ENCOUNTER — Ambulatory Visit (HOSPITAL_BASED_OUTPATIENT_CLINIC_OR_DEPARTMENT_OTHER)
Admission: RE | Admit: 2024-02-28 | Discharge: 2024-02-28 | Disposition: A | Discharge status: Home / Self Care | Source: Ambulatory Visit | Attending: Family Medicine | Admitting: Family Medicine

## 2024-02-28 DIAGNOSIS — R6889 Other general symptoms and signs: Secondary | ICD-10-CM | POA: Diagnosis not present

## 2024-02-28 DIAGNOSIS — R059 Cough, unspecified: Secondary | ICD-10-CM | POA: Diagnosis not present

## 2024-02-28 DIAGNOSIS — R052 Subacute cough: Secondary | ICD-10-CM | POA: Insufficient documentation

## 2024-02-28 DIAGNOSIS — J449 Chronic obstructive pulmonary disease, unspecified: Secondary | ICD-10-CM | POA: Diagnosis not present

## 2024-02-28 DIAGNOSIS — R918 Other nonspecific abnormal finding of lung field: Secondary | ICD-10-CM | POA: Diagnosis not present

## 2024-03-07 ENCOUNTER — Ambulatory Visit: Payer: Self-pay | Admitting: Family Medicine

## 2024-03-20 ENCOUNTER — Ambulatory Visit: Admitting: Family Medicine

## 2024-03-22 ENCOUNTER — Encounter (HOSPITAL_BASED_OUTPATIENT_CLINIC_OR_DEPARTMENT_OTHER): Admitting: Pulmonary Disease

## 2024-03-25 ENCOUNTER — Telehealth: Payer: Self-pay | Admitting: Family Medicine

## 2024-03-25 NOTE — Telephone Encounter (Unsigned)
 Copied from CRM #[id number]. Topic: Clinical - Medication Refill >> Mar 25, 2024  2:14 PM Victoria A wrote: Medication: ALPRAZolam  (XANAX ) 0.5 MG tablet  Has the patient contacted their pharmacy? Yes (Agent: If no, request that the patient contact the pharmacy for the refill. If patient does not wish to contact the pharmacy document the reason why and proceed with request.) (Agent: If yes, when and what did the pharmacy advise?) Pharmacy can not call primary  This is the patient's preferred pharmacy:  [id number] [address], [place] - [address] [place] [address] Phone: [phone] Fax: [phone]  Is this the correct pharmacy for this prescription? Yes If no, delete pharmacy and type the correct one.   Has the prescription been filled recently? No  Is the patient out of the medication? No 3 pills   Has the patient been seen for an appointment in the last year OR does the patient have an upcoming appointment? Yes  Can we respond through MyChart? Yes  Agent: Please be advised that Rx refills may take up to 3 business days. We ask that you follow-up with your pharmacy.

## 2024-03-26 MED ORDER — ALPRAZOLAM 0.5 MG PO TABS: ORAL_TABLET | ORAL | 5 refills | Status: AC

## 2024-03-26 NOTE — Telephone Encounter (Signed)
 Requesting: alprazolam  Contract: 05/12/23 UDS: 05/12/23 Last Visit: 02/27/24 Next Visit: 3 wk f/u not completed Last Refill: 09/01/23 (180,5)  Please Advise. Rx pending

## 2024-04-02 ENCOUNTER — Encounter: Payer: Self-pay | Admitting: Family Medicine

## 2024-04-02 ENCOUNTER — Ambulatory Visit (INDEPENDENT_AMBULATORY_CARE_PROVIDER_SITE_OTHER): Admitting: Family Medicine

## 2024-04-02 VITALS — BP 134/77 | HR 64 | Temp 97.4°F | Ht 68.0 in | Wt 180.2 lb

## 2024-04-02 DIAGNOSIS — F3177 Bipolar disorder, in partial remission, most recent episode mixed: Secondary | ICD-10-CM

## 2024-04-02 DIAGNOSIS — F411 Generalized anxiety disorder: Secondary | ICD-10-CM | POA: Diagnosis not present

## 2024-04-02 DIAGNOSIS — M47812 Spondylosis without myelopathy or radiculopathy, cervical region: Secondary | ICD-10-CM

## 2024-04-02 DIAGNOSIS — Z79899 Other long term (current) drug therapy: Secondary | ICD-10-CM

## 2024-04-02 DIAGNOSIS — M797 Fibromyalgia: Secondary | ICD-10-CM

## 2024-04-02 DIAGNOSIS — G894 Chronic pain syndrome: Secondary | ICD-10-CM | POA: Diagnosis not present

## 2024-04-02 DIAGNOSIS — R0989 Other specified symptoms and signs involving the circulatory and respiratory systems: Secondary | ICD-10-CM

## 2024-04-02 MED ORDER — LAMOTRIGINE 100 MG PO TABS: 300.0000 mg | ORAL_TABLET | Freq: Every day | ORAL | 3 refills | Status: AC

## 2024-04-02 MED ORDER — HYDROCODONE-ACETAMINOPHEN 5-325 MG PO TABS: ORAL_TABLET | ORAL | 0 refills | Status: AC

## 2024-04-02 MED ORDER — HYDRALAZINE HCL 10 MG PO TABS: ORAL_TABLET | ORAL | 1 refills | Status: DC

## 2024-04-02 NOTE — Progress Notes (Signed)
 OFFICE VISIT  04/02/2024  CC:  Chief Complaint  Patient presents with   Medical Management of Chronic Issues    Patient is a 73 y.o. female who presents for 1 mo f/u labile HTN and anx/bipolar. A/P as of last visit: #1 uncontrolled hypertension. Increase losartan  to 100 mg a day.  Continue HCTZ 25 mg a day and Toprol -XL 200 mg a day.   #2 COPD, relatively poor control. She does not tolerate prednisone --> significant increased anxiety, insomnia, mood lability. Question inspiratory crackles right base today. Check chest x-ray.  Continue Stiolto 2.5-2.5, 2 puffs daily as well as albuterol  every 6 hours as needed.  INTERIM HX: CXR last visit showed copd but otherwise normal.  She is doing okay.  Blood pressures ranging from 130-180 over 70s to 110. Her blood pressure goes highest when her cervicalgia/fibromyalgia is bothering her a lot. She takes Vicodin quite sparingly for her pain.  Mood is overall pretty stable.  She is coping well with the holidays, particularly when she gets out and is among others. No panic attacks.  PMP AWARE reviewed today: most recent rx for alprazolam  was filled 03/26/24, # 180, rx by me. Most recent vicodin rx filled 08/05/23, #90, rx by me. No red flags.  ROS as above, plus--> no fevers, no CP, no dizziness, no HAs, no rashes, no melena/hematochezia.  No polyuria or polydipsia.    No focal weakness, paresthesias, or tremors.  No acute vision or hearing abnormalities.  No dysuria or unusual/new urinary urgency or frequency.  No recent changes in lower legs. No n/v/d or abd pain.  No palpitations.      Past Medical History:  Diagnosis Date   Acute encephalopathy 11/19/2017   Alcoholism in remission [facility])    states last alcohol 11 yrs ago   Anemia    Anxiety    Bipolar 1 disorder (HCC)    Admission for mania x 2 (most recent 11/2017)   Cervical spondylosis without myelopathy 12/19/2013   MRI 02/2014: multilevel DDD/spondylosis, not much change  compared to prior MRI.  Pt not in favor of invasive therapy for her neck as of 04/2014.   Chronic constipation    Chronic renal insufficiency, stage 3 (moderate)    COPD (chronic obstructive pulmonary disease) (HCC)    Moderate: anoro started 04/2017 by pulm, pt symptomatically improved and PFTs stable at f/u 06/2017.   Coronary artery spasm 07/11/2018   nitrates=HA. Amlodipine =intol swelling.  Changed to coreg  09/03/18 by cardiologist.   Depression    Endometritis 05/2017   possible; empiric tx with Flagyl  by GYN.   Femoral neck fracture (HCC)    right, 01/28/23-->ORIF   Fibromyalgia    GERD (gastroesophageal reflux disease)    Hepatic steatosis 03/2019   u/s abd   Herpes zoster 08/07/2017   L scapular region   Hiatal hernia    History of adenomatous polyp of colon 04/2008; 08/2014   No high grade dysplasia: recall 5 yrs (Dr. Dianna, [facility])   History of basal cell carcinoma excision    NOSE   History of Helicobacter pylori infection 04/2008   +gastric biopsy (gastritis but no metaplasia, dysplasia, or malignancy identified)   History of kidney stones    History of TIA (transient ischemic attack)    secondary to HELLP syndrome 1994 post c/s--  residual memory loss   Hyperlipidemia    statin started after her NSTEMI: goal  LDL <70.   Hypertension    Ischemic cardiomyopathy 05/2018   Echo EF  45-50% in context of NSTEMI from CA spasm. 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 Magdalen 04/2017.   MCI (mild cognitive impairment) with memory loss    Memory loss    Abnl MRI brain and CT brain c/w chronic microvascular ischemia.  Repeat MRI brain 02/2014 stable (Dr. Smitty with no plan to f/u with neuro as of 04/2014)   NSTEMI (non-ST elevated  myocardial infarction) (HCC) 05/2018   Echo EF 45-50% in context of NSTEMI from CA spasm.// Myoview  8/21: EF 66, no ischemia, low risk   Osteoarthritis of left wrist    STT and 1st [facility] jt   Osteoporosis 05/2010   2012 'penia; 06/2015 'porosis:  prolia .  DEXA 09/2017 T-score -2.2 femur neck. 11/2021 T score -2.2. Rpt 2 yrs   Pelvic floor dysfunction    [facility] (Dr. Cam)   Postmenopausal vaginal bleeding 05/2017   x 1 small episode; GYN attempted endo bx but unable to penetrate cervix due to severe cerv stenosis (due to postmenopausal state + hx of LEEP).  Endo u/s showed thin endo lining.  Per GYN---no evidence of endomet pathology on w/u---obs as of 07/2017.   Prediabetes 06/2016   Fasting gluc 105; HbA1c at that time was 6.0%.  A1c 6.2% 12/2017.  A1c 5.7% Feb 2023.   Recurrent kidney stones 08/2013   Left ureteral calculus: Perc nephr--cystoscopy w/ureteroscopy + laser for stone removal.  Residual asymptomatic left renal nephrolithiasis <47mm present post-procedure.  Right sided hydronephrosis-persistent (on u/s)--urol ordered CT to further eval 08/2016: 1.6 cm nonobstructing stone lower pole left kidney, no hydro, plan for PCN extraction ([facility])   Sepsis (HCC)    UTI   Suprapubic discomfort 06/05/2015   TMJ (temporomandibular joint disorder)    USES  MOUTH GUARD   Toe fracture, left 12/2017   2nd toe prox phalanx (immobilization, Dr. Elner)   Vitamin D deficiency 05/2011    Past Surgical History:  Procedure Laterality Date   ABIs  11/29/2019   NORMAL   ANTERIOR CERVICAL DECOMP/DISCECTOMY FUSION  06/30/2009   C3 -- C5  AND EXPLORATION OF FUSION C5-7 W/  PLATE REMOVAL   ARTHRODESIS METATARSALPHALANGEAL JOINT (MTPJ) Left 07/08/2021   Procedure: Left hallux metatarsophalangeal joint arthrodesis;  Surgeon: Kit Rush, MD;  Location: [facility];  Service: Orthopedics;  Laterality: Left;   BACK SURGERY     BUNIONECTOMY Left    CARDIOVASCULAR STRESS TEST  12/03/2019    myoc perf im: NORMAL   CERVICAL FUSION  2002   anterior C5 -- C7   CERVICAL SPINE SURGERY  08/27/1999     C6 -- T1  LAMINECTOMY/  DISKECTOMY   CESAREAN SECTION  1994   CHOLECYSTECTOMY N/A 03/06/2020   Procedure: LAPAROSCOPIC CHOLECYSTECTOMY;  Surgeon: Lyndel Deward PARAS, MD;  Location: [facility];  Service: General;  Laterality: N/A;   COLONOSCOPY W/ POLYPECTOMY  04/2008;08/2014   2016 tubular adenoma x 1; +diverticulosis and int/ext hemorrhoids.  06/2020 adenoma, recall 5 yrs.   CYSTOSCOPY WITH URETEROSCOPY AND STENT PLACEMENT Left 08/28/2013   Procedure: CYSTOSCOPY, RGP,  WITH URETEROSCOPY AND STENT REMOVAL;  Surgeon: Alm GORMAN Fragmin, MD;  Location: [facility];  Service: Urology;  Laterality: Left;   DEXA  09/2017   T-score -2.2 femur neck: improved compared to 06/2015.  2021 DEXA T score -2.3.  11/2021 DEXA T score -2.2.   ESOPHAGOGASTRODUODENOSCOPY  2010; 08/2014   2016 +Candidal esophagitis; Mild chronic gastritis w/intestinal metaplasia--NEG H pylori, neg for eosinophilic esoph. 06/2020 esoph candidiasis (diflucan  x 20d), o/w normal.   HAMMER TOE SURGERY Left 07/08/2021   Procedure: Second metatarsal head excision; revision Second hammertoe correction; 2-5 percutaneus flexor tenotomies;  Surgeon: Kit Rush, MD;  Location: [facility];  Service: Orthopedics;  Laterality: Left;   HARDWARE REMOVAL Left 07/08/2021   Procedure: Removal of deep implants from hallux proximal phalanx and First and Second metatarsals;  Surgeon: Kit Rush, MD;  Location: [facility];  Service: Orthopedics;  Laterality: Left;   HIP PINNING,CANNULATED Right 01/28/2023   Procedure: PERCUTANEOUS FIXATION OF RIGHT FEMORAL NECK;  Surgeon: Sharl Selinda Dover, MD;  Location: [facility];  Service: Orthopedics;  Laterality: Right;   HOLMIUM LASER APPLICATION Left 08/28/2013   Procedure: HOLMIUM LASER APPLICATION;  Surgeon: Alm GORMAN Fragmin, MD;  Location: [facility];   Service: Urology;  Laterality: Left;   LEFT HEART CATH AND CORONARY ANGIOGRAPHY N/A 05/15/2018   Evidence for spasm in distal LAD.  Mod RCA atherosclerosis, o/w no CAD.  Procedure: LEFT HEART CATH AND CORONARY ANGIOGRAPHY;  Surgeon: Anner Alm ORN, MD;  Location: [facility];  Service: Cardiovascular;  Laterality: N/A;   NEPHROLITHOTOMY Left 08/05/2013   Procedure: NEPHROLITHOTOMY PERCUTANEOUS;  Surgeon: Alm GORMAN Fragmin, MD;  Location: [facility];  Service: Urology;  Laterality: Left;   POLYSOMNOGRAM  01/2019   NORMAL 2008 and 2020   POLYSOMNOGRAPHY  10/25/2018   Dr. Wilbert Passey due to pt inadequat sleep time (83 min).   TONSILLECTOMY AND ADENOIDECTOMY  1975   TOTAL KNEE ARTHROPLASTY Right 2003   TOTAL KNEE REVISION Right 01/19/2022   Procedure: Right knee polyethylene revision;  Surgeon: Melodi Lerner, MD;  Location: [facility];  Service: Orthopedics;  Laterality: Right;   TRANSTHORACIC ECHOCARDIOGRAM  05/2018; 12/03/2019   (after MI secondary to arterial spasm): EF 45-50%; severe hypokinesis of apical anterolateral, inferolateral , anterior, and inferior LV myocardium. 11/2019 EF 55-60%, valves fine   ULTRASOUND EXAM,PELVIC COMPLETE ([facility] HX)  06/25/2015   NORMAL (done by GYN)    Outpatient Medications Prior to Visit  Medication Sig Dispense Refill   albuterol  (VENTOLIN  HFA) 108 (90 Base) MCG/ACT inhaler TAKE 2 PUFFS BY MOUTH EVERY 6 HOURS AS NEEDED FOR WHEEZE OR SHORTNESS OF BREATH 18 each 0   ALPRAZolam  (XANAX ) 0.5 MG tablet TAKE 1-2 TABLETS BY MOUTH THREE TIMES A DAY AS NEEDED FOR ANXIETY 180 tablet 5   atorvastatin  (LIPITOR ) 80 MG tablet Take 1 tablet (80 mg total) by mouth daily. 90 tablet 3   buPROPion  (WELLBUTRIN  XL) 150 MG 24 hr tablet Take 1 tablet (150 mg total) by mouth daily. 90 tablet 3   citalopram  (CELEXA ) 40 MG tablet Take 1 tablet (40 mg total) by mouth daily. 90 tablet 3   famotidine  (PEPCID ) 40 MG tablet Take 1 tablet (40 mg total) by mouth daily. 90  tablet 3   fluticasone  (FLONASE ) 50 MCG/ACT nasal spray SPRAY 2 SPRAYS INTO EACH NOSTRIL EVERY DAY 48 mL 1   hydrochlorothiazide  (HYDRODIURIL ) 25 MG tablet Take 1 tablet (25 mg total) by mouth daily. 90 tablet 3   loratadine  (CLARITIN ) 10 MG tablet Take 10 mg by mouth daily.     losartan  (COZAAR ) 100 MG tablet Take 1 tablet (100 mg total) by mouth daily. 90 tablet 3   meloxicam  (  MOBIC ) 15 MG tablet Take 15 mg by mouth daily.     metoprolol  succinate (TOPROL -XL) 100 MG 24 hr tablet TAKE 2 TABS BY MOUTH DAILY. TAKE WITH OR IMMEDIATELY FOLLOWING A MEAL. 180 tablet 3   montelukast  (SINGULAIR ) 10 MG tablet TAKE 1 TABLET BY MOUTH EVERY DAY IN THE EVENING 90 tablet 1   nitroGLYCERIN  (NITROSTAT ) 0.4 MG SL tablet PLACE 1 TABLET (0.4 MG TOTAL) UNDER THE TONGUE EVERY 5 (FIVE) MINUTES X 3 DOSES AS NEEDED FOR CHEST PAIN. 25 tablet 1   ondansetron  (ZOFRAN -ODT) 4 MG disintegrating tablet Take 1 tablet (4 mg total) by mouth every 8 (eight) hours as needed. 8 tablet 0   pantoprazole  (PROTONIX ) 40 MG tablet Take 1 tablet (40 mg total) by mouth daily. PLEASE SCHEDULE F/U VISIT FOR ANY FUTURE REFILLS 90 tablet 0   potassium chloride  SA (KLOR-CON  M) 20 MEQ tablet Take 1 tablet (20 mEq total) by mouth daily. 90 tablet 3   Tiotropium Bromide -Olodaterol (STIOLTO RESPIMAT ) 2.5-2.5 MCG/ACT AERS INHALE 2 EACH INTO THE LUNGS DAILY. 4 g 5   HYDROcodone -acetaminophen  (NORCO/VICODIN) 5-325 MG tablet 1-2 tab po bid as needed for pain 90 tablet 0   lamoTRIgine  (LAMICTAL ) 100 MG tablet TAKE 3 TABLETS (300 MG TOTAL) BY MOUTH AT BEDTIME 270 tablet 0   No facility-administered medications prior to visit.    Allergies[1]  Review of Systems As per HPI  PE:    04/02/2024   10:53 AM 02/27/2024   10:47 AM 02/27/2024   10:41 AM  Vitals with BMI  Height 5' 8  5' 8  Weight 180 lbs 3 oz  185 lbs 6 oz  BMI 27.41  28.2  Systolic 134 146 852  Diastolic 77 80 63  Pulse 64  71     Physical Exam  Gen: Alert, well appearing.   Patient is oriented to person, place, time, and situation. AFFECT: pleasant, lucid thought and speech. No further exam today  LABS:  Last CBC Lab Results  Component Value Date   WBC 6.6 12/22/2023   HGB 10.6 (L) 12/22/2023   HCT 30.5 (L) 12/22/2023   MCV 87.9 12/22/2023   MCH 30.5 12/22/2023   RDW 12.3 12/22/2023   PLT 265 12/22/2023   Lab Results  Component Value Date   IRON 64 02/10/2023   TIBC 256 02/10/2023   FERRITIN 234 02/10/2023   Lab Results  Component Value Date   VITAMINB12 933 02/10/2023   Last metabolic panel Lab Results  Component Value Date   GLUCOSE 109 (H) 12/22/2023   NA 134 (L) 12/22/2023   K 4.5 12/22/2023   CL 98 12/22/2023   CO2 22 12/22/2023   BUN 15 12/22/2023   CREATININE 1.00 12/22/2023   GFRNONAA >60 12/22/2023   CALCIUM  9.7 12/22/2023   PROT 6.8 12/22/2023   ALBUMIN  4.6 12/22/2023   BILITOT 0.5 12/22/2023   ALKPHOS 70 12/22/2023   AST 20 12/22/2023   ALT 19 12/22/2023   ANIONGAP 14 12/22/2023   Last lipids Lab Results  Component Value Date   CHOL 176 01/19/2023   HDL 70.60 01/19/2023   LDLCALC 90 01/19/2023   LDLDIRECT 144.1 05/29/2013   TRIG 76.0 01/19/2023   CHOLHDL 2 01/19/2023   Last hemoglobin A1c Lab Results  Component Value Date   HGBA1C 5.5 01/06/2022   Last thyroid  functions Lab Results  Component Value Date   TSH 0.91 10/12/2022   Lab Results  Component Value Date   VITAMINB12 933 02/10/2023   Lab Results  Component Value Date   IRON 64 02/10/2023   TIBC 256 02/10/2023   FERRITIN 234 02/10/2023   IMPRESSION AND PLAN:  #1 labile hypertension.  Minimal change with recent maximization of her losartan  to 100 mg daily.  We will add hydralazine  10 mg tab, take 1 tab every 8 hours as needed for systolic greater than 170 or diastolic greater than 100. Continue losartan  100 mg a day, Toprol -XL 200 mg a day, and HCTZ 25 mg a day. Check basic metabolic panel today.  2.  GAD, bipolar disorder. All is pretty  stable.  Continue alprazolam  0.5 mg tabs, 1-2 3 times daily as needed, Wellbutrin  XL 150 mg a day, citalopram  40 mg a day, and Lamictal  300 mg a day.  #3 chronic pain syndrome. She has C-spine arthritis, chronic right hip pain, and fibromyalgia. Okay to take meloxicam  15 mg daily as needed. Additionally, she can continue to take Vicodin 5-325, 1-2 twice daily as needed severe pain.  New prescription for #90 given today.  An After Visit Summary was printed and given to the patient.  FOLLOW UP: Return in about 4 weeks (around 04/30/2024) for routine chronic illness f/u.  Signed:  Gerlene Hockey, MD           04/02/2024     [1]  Allergies Allergen Reactions   Amlodipine  Swelling   Ceftin [Cefuroxime Axetil] Anaphylaxis and Swelling    Swelling of eyes and tongue   Cipro  [Ciprofloxacin  Hcl] Anaphylaxis and Swelling   Risperdal  [Risperidone ] Palpitations    Generalized weakness Malaise  Hypertension   Fosamax  [Alendronate  Sodium] Diarrhea   Linzess [Linaclotide] Diarrhea   Morphine  And Codeine Other (See Comments)    Hallucinations Disorientation    Movantik  [Naloxegol ]     N/v   Prednisone  Other (See Comments)    Hyperactivity   Roxicodone  [Oxycodone ] Other (See Comments)    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

## 2024-04-03 ENCOUNTER — Other Ambulatory Visit (HOSPITAL_COMMUNITY): Payer: Self-pay

## 2024-04-03 ENCOUNTER — Telehealth: Payer: Self-pay

## 2024-04-03 LAB — BASIC METABOLIC PANEL WITH GFR
BUN: 15 mg/dL (ref 7–25)
CO2: 30 mmol/L (ref 20–32)
Calcium: 9.8 mg/dL (ref 8.6–10.4)
Chloride: 101 mmol/L (ref 98–110)
Creat: 0.98 mg/dL (ref 0.60–1.00)
Glucose, Bld: 109 mg/dL — ABNORMAL HIGH (ref 65–99)
Potassium: 4.1 mmol/L (ref 3.5–5.3)
Sodium: 137 mmol/L (ref 135–146)
eGFR: 62 mL/min/1.73m2

## 2024-04-03 NOTE — Telephone Encounter (Signed)
 Clinical questions have been answered and PA submitted. PA currently Pending.

## 2024-04-03 NOTE — Telephone Encounter (Signed)
 Pharmacy faxed request for Prior Auth (Hydrocodone )  Can you assist?

## 2024-04-03 NOTE — Telephone Encounter (Signed)
 Patient called in to check the status of prior auth being submitted for Hydrocodone . Patient made aware request was received by the pharmacy and forwarded to the PA Team to work on.

## 2024-04-03 NOTE — Telephone Encounter (Signed)
 Pharmacy Patient Advocate Encounter   Received notification from Pt Calls Messages that prior authorization for HYDROcodone -Acetaminophen  5-325MG  tablets is required/requested.   Insurance verification completed.   The patient is insured through [facility].   Per test claim: PA required; PA started via CoverMyMeds. KEY [id number] . Waiting for clinical questions to populate.

## 2024-04-03 NOTE — Telephone Encounter (Signed)
 Pharmacy Patient Advocate Encounter  Received notification from [facility] that Prior Authorization for HYDROcodone -Acetaminophen  5-325MG  tablets has been APPROVED from 04/05/23 to 04/03/24   PA #/Case ID/Reference #: [id number]

## 2024-04-03 NOTE — Telephone Encounter (Signed)
 PA request has been Started. New Encounter has been or will be created for follow up. For additional info see Pharmacy Prior Auth telephone encounter from 04/03/24.

## 2024-04-05 ENCOUNTER — Ambulatory Visit: Payer: Self-pay | Admitting: Family Medicine

## 2024-04-15 ENCOUNTER — Encounter (HOSPITAL_BASED_OUTPATIENT_CLINIC_OR_DEPARTMENT_OTHER)

## 2024-04-15 ENCOUNTER — Encounter: Payer: Self-pay | Admitting: Primary Care

## 2024-04-15 ENCOUNTER — Ambulatory Visit: Admitting: Primary Care

## 2024-04-15 DIAGNOSIS — J449 Chronic obstructive pulmonary disease, unspecified: Secondary | ICD-10-CM

## 2024-04-21 ENCOUNTER — Other Ambulatory Visit: Payer: Self-pay | Admitting: Internal Medicine

## 2024-04-24 ENCOUNTER — Other Ambulatory Visit: Payer: Self-pay | Admitting: Pulmonary Disease

## 2024-04-25 ENCOUNTER — Encounter: Payer: Self-pay | Admitting: Family Medicine

## 2024-04-25 ENCOUNTER — Other Ambulatory Visit: Payer: Self-pay | Admitting: Family Medicine

## 2024-04-25 ENCOUNTER — Ambulatory Visit (INDEPENDENT_AMBULATORY_CARE_PROVIDER_SITE_OTHER): Admitting: Family Medicine

## 2024-04-25 VITALS — BP 131/78 | HR 63 | Temp 97.3°F | Ht 68.0 in | Wt 188.2 lb

## 2024-04-25 DIAGNOSIS — N3001 Acute cystitis with hematuria: Secondary | ICD-10-CM

## 2024-04-25 DIAGNOSIS — R8290 Unspecified abnormal findings in urine: Secondary | ICD-10-CM

## 2024-04-25 LAB — POC URINALSYSI DIPSTICK (AUTOMATED)
Bilirubin, UA: NEGATIVE
Glucose, UA: NEGATIVE
Ketones, UA: NEGATIVE
Nitrite, UA: NEGATIVE
Protein, UA: NEGATIVE
Spec Grav, UA: 1.01
Urobilinogen, UA: 0.2 U/dL
pH, UA: 6.5

## 2024-04-25 MED ORDER — STIOLTO RESPIMAT 2.5-2.5 MCG/ACT IN AERS: 2.0000 | INHALATION_SPRAY | Freq: Every day | RESPIRATORY_TRACT | 11 refills | Status: DC

## 2024-04-25 MED ORDER — SULFAMETHOXAZOLE-TRIMETHOPRIM 800-160 MG PO TABS: 1.0000 | ORAL_TABLET | Freq: Two times a day (BID) | ORAL | 0 refills | Status: AC

## 2024-04-25 NOTE — Telephone Encounter (Signed)
 Pt was seen in our clinic today, insurance will not cover Stiolto without Prior Auth.   Please further advise

## 2024-04-25 NOTE — Progress Notes (Signed)
 OFFICE VISIT  04/25/2024  CC:  Chief Complaint  Patient presents with   Urinary Concern    Pt had cloudy urine with odor for 1.5 weeks; confirmed no OTC meds taken.   Patient is a 74 y.o. female who presents for urinary concern.  HPI: Approximately 10 days of urinary urgency, suprapubic discomfort, urine cloudy and malodorous. No fever.  She does feel tired.  No blood in urine, no flank pain, no abdominal pain, no nausea. Past Medical History:  Diagnosis Date   Acute encephalopathy 11/19/2017   Alcoholism in remission [facility])    states last alcohol 11 yrs ago   Anemia    Anxiety    Bipolar 1 disorder (HCC)    Admission for mania x 2 (most recent 11/2017)   Cervical spondylosis without myelopathy 12/19/2013   MRI 02/2014: multilevel DDD/spondylosis, not much change compared to prior MRI.  Pt not in favor of invasive therapy for her neck as of 04/2014.   Chronic constipation    Chronic renal insufficiency, stage 3 (moderate)    COPD (chronic obstructive pulmonary disease) (HCC)    Moderate: anoro started 04/2017 by pulm, pt symptomatically improved and PFTs stable at f/u 06/2017.   Coronary artery spasm 07/11/2018   nitrates=HA. Amlodipine =intol swelling.  Changed to coreg  09/03/18 by cardiologist.   Depression    Endometritis 05/2017   possible; empiric tx with Flagyl  by GYN.   Femoral neck fracture (HCC)    right, 01/28/23-->ORIF   Fibromyalgia    GERD (gastroesophageal reflux disease)    Hepatic steatosis 03/2019   u/s abd   Herpes zoster 08/07/2017   L scapular region   Hiatal hernia    History of adenomatous polyp of colon 04/2008; 08/2014   No high grade dysplasia: recall 5 yrs (Dr. Dianna, [facility])   History of basal cell carcinoma excision    NOSE   History of Helicobacter pylori infection 04/2008   +gastric biopsy (gastritis but no metaplasia, dysplasia, or malignancy identified)   History of kidney stones    History of TIA (transient ischemic attack)    secondary  to HELLP syndrome 1994 post c/s--  residual memory loss   Hyperlipidemia    statin started after her NSTEMI: goal  LDL <70.   Hypertension    Ischemic cardiomyopathy 05/2018   Echo EF 45-50% in context of NSTEMI from CA spasm. 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 Magdalen 04/2017.   MCI (mild cognitive impairment) with memory loss    Memory loss    Abnl MRI brain and CT brain c/w chronic microvascular ischemia.  Repeat MRI brain 02/2014 stable (Dr. Smitty with no plan to f/u with neuro as of 04/2014)   NSTEMI (non-ST elevated myocardial infarction) (HCC) 05/2018   Echo EF 45-50% in context of NSTEMI from CA spasm.// Myoview  8/21: EF 66, no ischemia, low risk   Osteoarthritis of left wrist    STT and 1st [facility] jt   Osteoporosis 05/2010   2012 'penia; 06/2015 'porosis:  prolia .  DEXA 09/2017 T-score -2.2 femur neck. 11/2021 T score -2.2. Rpt 2 yrs   Pelvic floor dysfunction    [facility] (Dr. Cam)   Postmenopausal vaginal bleeding 05/2017   x 1 small episode; GYN attempted endo bx but unable to penetrate  cervix due to severe cerv stenosis (due to postmenopausal state + hx of LEEP).  Endo u/s showed thin endo lining.  Per GYN---no evidence of endomet pathology on w/u---obs as of 07/2017.   Prediabetes 06/2016   Fasting gluc 105; HbA1c at that time was 6.0%.  A1c 6.2% 12/2017.  A1c 5.7% Feb 2023.   Recurrent kidney stones 08/2013   Left ureteral calculus: Perc nephr--cystoscopy w/ureteroscopy + laser for stone removal.  Residual asymptomatic left renal nephrolithiasis <24mm present post-procedure.  Right sided hydronephrosis-persistent (on u/s)--urol ordered CT to further eval 08/2016: 1.6 cm nonobstructing stone lower pole left kidney, no hydro, plan  for PCN extraction ([facility])   Sepsis (HCC)    UTI   Suprapubic discomfort 06/05/2015   TMJ (temporomandibular joint disorder)    USES  MOUTH GUARD   Toe fracture, left 12/2017   2nd toe prox phalanx (immobilization, Dr. Elner)   Vitamin D deficiency 05/2011    Past Surgical History:  Procedure Laterality Date   ABIs  11/29/2019   NORMAL   ANTERIOR CERVICAL DECOMP/DISCECTOMY FUSION  06/30/2009   C3 -- C5  AND EXPLORATION OF FUSION C5-7 W/  PLATE REMOVAL   ARTHRODESIS METATARSALPHALANGEAL JOINT (MTPJ) Left 07/08/2021   Procedure: Left hallux metatarsophalangeal joint arthrodesis;  Surgeon: Kit Rush, MD;  Location: [facility];  Service: Orthopedics;  Laterality: Left;   BACK SURGERY     BUNIONECTOMY Left    CARDIOVASCULAR STRESS TEST  12/03/2019   myoc perf im: NORMAL   CERVICAL FUSION  2002   anterior C5 -- C7   CERVICAL SPINE SURGERY  08/27/1999     C6 -- T1  LAMINECTOMY/  DISKECTOMY   CESAREAN SECTION  1994   CHOLECYSTECTOMY N/A 03/06/2020   Procedure: LAPAROSCOPIC CHOLECYSTECTOMY;  Surgeon: Lyndel Deward PARAS, MD;  Location: [facility];  Service: General;  Laterality: N/A;   COLONOSCOPY W/ POLYPECTOMY  04/2008;08/2014   2016 tubular adenoma x 1; +diverticulosis and int/ext hemorrhoids.  06/2020 adenoma, recall 5 yrs.   CYSTOSCOPY WITH URETEROSCOPY AND STENT PLACEMENT Left 08/28/2013   Procedure: CYSTOSCOPY, RGP,  WITH URETEROSCOPY AND STENT REMOVAL;  Surgeon: Alm GORMAN Fragmin, MD;  Location: [facility];  Service: Urology;  Laterality: Left;   DEXA  09/2017   T-score -2.2 femur neck: improved compared to 06/2015.  2021 DEXA T score -2.3.  11/2021 DEXA T score -2.2.   ESOPHAGOGASTRODUODENOSCOPY  2010; 08/2014   2016 +Candidal esophagitis; Mild chronic gastritis w/intestinal metaplasia--NEG H pylori, neg for eosinophilic esoph. 06/2020 esoph candidiasis (diflucan  x 20d), o/w normal.   HAMMER TOE SURGERY Left 07/08/2021   Procedure: Second metatarsal head  excision; revision Second hammertoe correction; 2-5 percutaneus flexor tenotomies;  Surgeon: Kit Rush, MD;  Location: [facility];  Service: Orthopedics;  Laterality: Left;   HARDWARE REMOVAL Left 07/08/2021   Procedure: Removal of deep implants from hallux proximal phalanx and First and Second metatarsals;  Surgeon: Kit Rush, MD;  Location: [facility];  Service: Orthopedics;  Laterality: Left;   HIP PINNING,CANNULATED Right 01/28/2023   Procedure: PERCUTANEOUS FIXATION OF RIGHT FEMORAL NECK;  Surgeon: Sharl Selinda Dover, MD;  Location: [facility] OR;  Service: Orthopedics;  Laterality: Right;   HOLMIUM LASER APPLICATION Left 08/28/2013   Procedure: HOLMIUM LASER APPLICATION;  Surgeon: Alm GORMAN Fragmin, MD;  Location: [facility];  Service: Urology;  Laterality: Left;   LEFT HEART CATH AND CORONARY ANGIOGRAPHY N/A 05/15/2018   Evidence for spasm in distal LAD.  Mod RCA atherosclerosis, o/w no CAD.  Procedure: LEFT HEART CATH AND CORONARY ANGIOGRAPHY;  Surgeon: Anner Alm ORN, MD;  Location: [facility] IN[facility] CV [facility];  Service: Cardiovascular;  Laterality: N/A;   NEPHROLITHOTOMY Left 08/05/2013   Procedure: NEPHROLITHOTOMY PERCUTANEOUS;  Surgeon: Alm GORMAN Fragmin, MD;  Location: [facility];  Service: Urology;  Laterality: Left;   POLYSOMNOGRAM  01/2019   NORMAL 2008 and 2020   POLYSOMNOGRAPHY  10/25/2018   Dr. Wilbert Passey due to pt inadequat sleep time (83 min).   TONSILLECTOMY AND ADENOIDECTOMY  1975   TOTAL KNEE ARTHROPLASTY Right 2003   TOTAL KNEE REVISION Right 01/19/2022   Procedure: Right knee polyethylene revision;  Surgeon: Melodi Lerner, MD;  Location: [facility];  Service: Orthopedics;  Laterality: Right;   TRANSTHORACIC ECHOCARDIOGRAM  05/2018; 12/03/2019   (after MI secondary to arterial spasm): EF 45-50%; severe hypokinesis of apical anterolateral, inferolateral , anterior, and inferior LV myocardium. 11/2019 EF 55-60%, valves fine    ULTRASOUND EXAM,PELVIC COMPLETE ([facility] HX)  06/25/2015   NORMAL (done by GYN)    Outpatient Medications Prior to Visit  Medication Sig Dispense Refill   albuterol  (VENTOLIN  HFA) 108 (90 Base) MCG/ACT inhaler TAKE 2 PUFFS BY MOUTH EVERY 6 HOURS AS NEEDED FOR WHEEZE OR SHORTNESS OF BREATH 18 each 0   ALPRAZolam  (XANAX ) 0.5 MG tablet TAKE 1-2 TABLETS BY MOUTH THREE TIMES A DAY AS NEEDED FOR ANXIETY 180 tablet 5   atorvastatin  (LIPITOR ) 80 MG tablet Take 1 tablet (80 mg total) by mouth daily. 90 tablet 3   buPROPion  (WELLBUTRIN  XL) 150 MG 24 hr tablet Take 1 tablet (150 mg total) by mouth daily. 90 tablet 3   citalopram  (CELEXA ) 40 MG tablet Take 1 tablet (40 mg total) by mouth daily. 90 tablet 3   famotidine  (PEPCID ) 40 MG tablet Take 1 tablet (40 mg total) by mouth daily. 90 tablet 3   fluticasone  (FLONASE ) 50 MCG/ACT nasal spray SPRAY 2 SPRAYS INTO EACH NOSTRIL EVERY DAY 48 mL 1   hydrALAZINE  (APRESOLINE ) 10 MG tablet 1 tab po tid prn blood pressure >170 systolic or >100 diastolic 90 tablet 1   hydrochlorothiazide  (HYDRODIURIL ) 25 MG tablet Take 1 tablet (25 mg total) by mouth daily. 90 tablet 3   HYDROcodone -acetaminophen  (NORCO/VICODIN) 5-325 MG tablet 1-2 tab po bid as needed for pain 90 tablet 0   lamoTRIgine  (LAMICTAL ) 100 MG tablet Take 3 tablets (300 mg total) by mouth at bedtime. 270 tablet 3   loratadine  (CLARITIN ) 10 MG tablet Take 10 mg by mouth daily.     losartan  (COZAAR ) 100 MG tablet Take 1 tablet (100 mg total) by mouth daily. 90 tablet 3   meloxicam  (MOBIC ) 15 MG tablet Take 15 mg by mouth daily.     metoprolol  succinate (TOPROL -XL) 100 MG 24 hr tablet TAKE 2 TABS BY MOUTH DAILY. TAKE WITH OR IMMEDIATELY FOLLOWING A MEAL. 180 tablet 3   montelukast  (SINGULAIR ) 10 MG tablet TAKE 1 TABLET BY MOUTH EVERY DAY IN THE EVENING 90 tablet 1   nitroGLYCERIN  (NITROSTAT ) 0.4 MG SL tablet PLACE 1 TABLET (0.4 MG TOTAL) UNDER THE TONGUE EVERY 5 (FIVE) MINUTES X 3 DOSES AS NEEDED FOR CHEST PAIN.  25 tablet 1   ondansetron  (ZOFRAN -ODT) 4 MG disintegrating tablet Take 1 tablet (4 mg total) by mouth every 8 (eight) hours as needed. 8 tablet 0   pantoprazole  (PROTONIX ) 40 MG tablet Take 1 tablet (40 mg total) by mouth daily. PLEASE SCHEDULE F/U VISIT FOR ANY FUTURE REFILLS 90  tablet 0   potassium chloride  SA (KLOR-CON  M) 20 MEQ tablet Take 1 tablet (20 mEq total) by mouth daily. 90 tablet 3   Tiotropium Bromide -Olodaterol (STIOLTO RESPIMAT ) 2.5-2.5 MCG/ACT AERS INHALE 2 EACH INTO THE LUNGS DAILY. (Patient not taking: Reported on 04/25/2024) 4 g 5   No facility-administered medications prior to visit.    Allergies[1]  Review of Systems  As per HPI  PE:    04/25/2024    3:45 PM 04/02/2024   10:53 AM 02/27/2024   10:47 AM  Vitals with BMI  Height 5' 8 5' 8   Weight 188 lbs 3 oz 180 lbs 3 oz   BMI 28.62 27.41   Systolic 131 134 853  Diastolic 78 77 80  Pulse 63 64      Physical Exam  Gen: Alert, well appearing.  Patient is oriented to person, place, time, and situation. AFFECT: pleasant, lucid thought and speech. No further exam today  LABS:  Last metabolic panel Lab Results  Component Value Date   GLUCOSE 109 (H) 04/02/2024   NA 137 04/02/2024   K 4.1 04/02/2024   CL 101 04/02/2024   CO2 30 04/02/2024   BUN 15 04/02/2024   CREATININE 0.98 04/02/2024   EGFR 62 04/02/2024   CALCIUM  9.8 04/02/2024   PROT 6.8 12/22/2023   ALBUMIN  4.6 12/22/2023   BILITOT 0.5 12/22/2023   ALKPHOS 70 12/22/2023   AST 20 12/22/2023   ALT 19 12/22/2023   ANIONGAP 14 12/22/2023   Lab Results  Component Value Date   CHOL 176 01/19/2023   HDL 70.60 01/19/2023   LDLCALC 90 01/19/2023   LDLDIRECT 144.1 05/29/2013   TRIG 76.0 01/19/2023   CHOLHDL 2 01/19/2023   Lab Results  Component Value Date   WBC 6.6 12/22/2023   HGB 10.6 (L) 12/22/2023   HCT 30.5 (L) 12/22/2023   MCV 87.9 12/22/2023   PLT 265 12/22/2023   IMPRESSION AND PLAN:  UTI. Dipstick UA today: 2+ blood, 3+  LEU, cloudy yellow. Bactrim  double strength, 1 twice daily x 5 days.  An After Visit Summary was printed and given to the patient.  FOLLOW UP: Return if symptoms worsen or fail to improve.  Signed:  Gerlene Hockey, MD           04/25/2024     [1]  Allergies Allergen Reactions   Amlodipine  Swelling   Ceftin [Cefuroxime Axetil] Anaphylaxis and Swelling    Swelling of eyes and tongue   Cipro  [Ciprofloxacin  Hcl] Anaphylaxis and Swelling   Risperdal  [Risperidone ] Palpitations    Generalized weakness Malaise  Hypertension   Fosamax  [Alendronate  Sodium] Diarrhea   Linzess [Linaclotide] Diarrhea   Morphine  And Codeine Other (See Comments)    Hallucinations Disorientation    Movantik  [Naloxegol ]     N/v   Prednisone  Other (See Comments)    Hyperactivity   Roxicodone  [Oxycodone ] Other (See Comments)    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

## 2024-04-26 ENCOUNTER — Other Ambulatory Visit: Payer: Self-pay | Admitting: Family Medicine

## 2024-04-26 MED ORDER — STIOLTO RESPIMAT 2.5-2.5 MCG/ACT IN AERS: 2.0000 | INHALATION_SPRAY | Freq: Every day | RESPIRATORY_TRACT | 11 refills | Status: AC

## 2024-04-26 NOTE — Telephone Encounter (Signed)
" ° °  Spoke with pt to advise of medication update, new rx sent for [employer]. She will follow up with the pharmacy.  "

## 2024-04-26 NOTE — Telephone Encounter (Signed)
 Spoke with pt to advise alternative inhaler sent, pt states she has tried using this inhaler in the past but ineffective and nausea.   PA started  (Key: [id number])

## 2024-04-26 NOTE — Telephone Encounter (Signed)
 Inhaler not covered by pts insurance. Pharmacy is requesting an alternative

## 2024-04-27 ENCOUNTER — Other Ambulatory Visit: Payer: Self-pay | Admitting: Family Medicine

## 2024-04-27 LAB — URINE CULTURE
MICRO NUMBER:: 17502774
SPECIMEN QUALITY:: ADEQUATE

## 2024-04-29 ENCOUNTER — Ambulatory Visit: Payer: Self-pay | Admitting: Family Medicine

## 2024-05-01 NOTE — Telephone Encounter (Signed)
 Copied from CRM #[id number]. Topic: Clinical - Prescription Issue >> Apr 25, 2024  3:33 PM Rilla B wrote: Reason for CRM: Monique from [facility] calling regarding the [employer] prescription. It is non formulary.  Alternatives: Anoro or Bevespi.  Please call [phone] >> Apr 26, 2024 10:00 AM Robinson H wrote: Patient following up on inhaler status, states insurance states they need something saying she tried other inhalers and the Stiolto is the only one that works.  Donny  [phone]

## 2024-05-02 ENCOUNTER — Ambulatory Visit: Admitting: Family Medicine

## 2024-05-07 ENCOUNTER — Other Ambulatory Visit: Payer: Self-pay | Admitting: Family Medicine

## 2024-05-09 ENCOUNTER — Other Ambulatory Visit: Payer: Self-pay | Admitting: Family Medicine

## 2024-05-10 ENCOUNTER — Other Ambulatory Visit: Payer: Self-pay | Admitting: Family Medicine

## 2024-10-01 ENCOUNTER — Ambulatory Visit: Admitting: Physician Assistant
